# Patient Record
Sex: Male | Born: 1955 | Race: Black or African American | Hispanic: No | Smoking: Never smoker
Health system: Southern US, Community
[De-identification: ages and names within clinical notes are randomized; demographics above are authoritative.]

## PROBLEM LIST (undated history)

## (undated) DIAGNOSIS — E785 Hyperlipidemia, unspecified: Secondary | ICD-10-CM

## (undated) DIAGNOSIS — E11319 Type 2 diabetes mellitus with unspecified diabetic retinopathy without macular edema: Secondary | ICD-10-CM

## (undated) DIAGNOSIS — I219 Acute myocardial infarction, unspecified: Secondary | ICD-10-CM

## (undated) DIAGNOSIS — E1149 Type 2 diabetes mellitus with other diabetic neurological complication: Secondary | ICD-10-CM

## (undated) DIAGNOSIS — L309 Dermatitis, unspecified: Secondary | ICD-10-CM

## (undated) DIAGNOSIS — N289 Disorder of kidney and ureter, unspecified: Secondary | ICD-10-CM

## (undated) DIAGNOSIS — K668 Other specified disorders of peritoneum: Secondary | ICD-10-CM

## (undated) DIAGNOSIS — Z992 Dependence on renal dialysis: Secondary | ICD-10-CM

## (undated) DIAGNOSIS — E1140 Type 2 diabetes mellitus with diabetic neuropathy, unspecified: Secondary | ICD-10-CM

## (undated) DIAGNOSIS — I10 Essential (primary) hypertension: Secondary | ICD-10-CM

## (undated) DIAGNOSIS — D126 Benign neoplasm of colon, unspecified: Secondary | ICD-10-CM

## (undated) DIAGNOSIS — N186 End stage renal disease: Secondary | ICD-10-CM

## (undated) DIAGNOSIS — D649 Anemia, unspecified: Secondary | ICD-10-CM

## (undated) DIAGNOSIS — I5042 Chronic combined systolic (congestive) and diastolic (congestive) heart failure: Secondary | ICD-10-CM

## (undated) DIAGNOSIS — C801 Malignant (primary) neoplasm, unspecified: Secondary | ICD-10-CM

## (undated) DIAGNOSIS — J189 Pneumonia, unspecified organism: Secondary | ICD-10-CM

## (undated) DIAGNOSIS — H9191 Unspecified hearing loss, right ear: Secondary | ICD-10-CM

## (undated) DIAGNOSIS — G4730 Sleep apnea, unspecified: Secondary | ICD-10-CM

## (undated) DIAGNOSIS — I160 Hypertensive urgency: Secondary | ICD-10-CM

## (undated) DIAGNOSIS — I169 Hypertensive crisis, unspecified: Secondary | ICD-10-CM

## (undated) DIAGNOSIS — N049 Nephrotic syndrome with unspecified morphologic changes: Secondary | ICD-10-CM

## (undated) DIAGNOSIS — I639 Cerebral infarction, unspecified: Secondary | ICD-10-CM

## (undated) DIAGNOSIS — I509 Heart failure, unspecified: Secondary | ICD-10-CM

## (undated) HISTORY — DX: Benign neoplasm of colon, unspecified: D12.6

## (undated) HISTORY — DX: Hyperlipidemia, unspecified: E78.5

## (undated) HISTORY — DX: Cerebral infarction, unspecified: I63.9

## (undated) HISTORY — DX: Heart failure, unspecified: I50.9

## (undated) HISTORY — DX: Type 2 diabetes mellitus with other diabetic neurological complication: E11.49

## (undated) HISTORY — DX: Hypertensive crisis, unspecified: I16.9

## (undated) HISTORY — PX: COLONOSCOPY: SHX174

## (undated) HISTORY — DX: Essential (primary) hypertension: I10

## (undated) HISTORY — DX: Dermatitis, unspecified: L30.9

## (undated) HISTORY — DX: Hypertensive urgency: I16.0

## (undated) HISTORY — PX: CARDIAC CATHETERIZATION: SHX172

## (undated) HISTORY — PX: POLYPECTOMY: SHX149

## (undated) HISTORY — PX: FOOT SURGERY: SHX648

## (undated) HISTORY — DX: Other specified disorders of peritoneum: K66.8

## (undated) HISTORY — DX: Nephrotic syndrome with unspecified morphologic changes: N04.9

## (undated) HISTORY — DX: Type 2 diabetes mellitus with unspecified diabetic retinopathy without macular edema: E11.319

## (undated) HISTORY — DX: Unspecified hearing loss, right ear: H91.91

## (undated) HISTORY — DX: Chronic combined systolic (congestive) and diastolic (congestive) heart failure: I50.42

---

## 1997-11-18 ENCOUNTER — Encounter: Admission: RE | Admit: 1997-11-18 | Discharge: 1997-11-18 | Payer: Self-pay | Admitting: Internal Medicine

## 1997-12-03 ENCOUNTER — Ambulatory Visit (HOSPITAL_COMMUNITY): Admission: RE | Admit: 1997-12-03 | Discharge: 1997-12-03 | Payer: Self-pay | Admitting: Internal Medicine

## 1997-12-03 ENCOUNTER — Encounter: Admission: RE | Admit: 1997-12-03 | Discharge: 1997-12-03 | Payer: Self-pay | Admitting: Internal Medicine

## 1999-12-14 ENCOUNTER — Emergency Department (HOSPITAL_COMMUNITY): Admission: EM | Admit: 1999-12-14 | Discharge: 1999-12-14 | Payer: Self-pay | Admitting: Emergency Medicine

## 1999-12-16 ENCOUNTER — Encounter: Admission: RE | Admit: 1999-12-16 | Discharge: 1999-12-16 | Payer: Self-pay | Admitting: Hematology and Oncology

## 1999-12-30 ENCOUNTER — Encounter: Admission: RE | Admit: 1999-12-30 | Discharge: 2000-03-29 | Payer: Self-pay | Admitting: *Deleted

## 1999-12-30 ENCOUNTER — Encounter: Admission: RE | Admit: 1999-12-30 | Discharge: 1999-12-30 | Payer: Self-pay | Admitting: Hematology and Oncology

## 2000-06-15 ENCOUNTER — Encounter: Admission: RE | Admit: 2000-06-15 | Discharge: 2000-06-15 | Payer: Self-pay | Admitting: Hematology and Oncology

## 2000-09-03 ENCOUNTER — Inpatient Hospital Stay (HOSPITAL_COMMUNITY): Admission: EM | Admit: 2000-09-03 | Discharge: 2000-09-07 | Payer: Self-pay | Admitting: *Deleted

## 2000-09-03 ENCOUNTER — Encounter: Payer: Self-pay | Admitting: *Deleted

## 2000-09-05 ENCOUNTER — Encounter: Payer: Self-pay | Admitting: Cardiology

## 2000-09-11 ENCOUNTER — Encounter: Admission: RE | Admit: 2000-09-11 | Discharge: 2000-09-11 | Payer: Self-pay | Admitting: Internal Medicine

## 2001-03-20 ENCOUNTER — Encounter: Admission: RE | Admit: 2001-03-20 | Discharge: 2001-03-20 | Payer: Self-pay | Admitting: *Deleted

## 2001-09-24 ENCOUNTER — Encounter: Admission: RE | Admit: 2001-09-24 | Discharge: 2001-09-24 | Payer: Self-pay | Admitting: Internal Medicine

## 2002-03-17 ENCOUNTER — Emergency Department (HOSPITAL_COMMUNITY): Admission: EM | Admit: 2002-03-17 | Discharge: 2002-03-17 | Payer: Self-pay | Admitting: Emergency Medicine

## 2002-03-17 ENCOUNTER — Encounter: Payer: Self-pay | Admitting: Emergency Medicine

## 2002-08-04 ENCOUNTER — Inpatient Hospital Stay (HOSPITAL_COMMUNITY): Admission: EM | Admit: 2002-08-04 | Discharge: 2002-08-05 | Payer: Self-pay | Admitting: Emergency Medicine

## 2002-08-04 ENCOUNTER — Encounter: Payer: Self-pay | Admitting: Emergency Medicine

## 2002-08-05 ENCOUNTER — Encounter: Payer: Self-pay | Admitting: Internal Medicine

## 2002-08-13 ENCOUNTER — Encounter: Payer: Self-pay | Admitting: Emergency Medicine

## 2002-08-13 ENCOUNTER — Inpatient Hospital Stay (HOSPITAL_COMMUNITY): Admission: EM | Admit: 2002-08-13 | Discharge: 2002-08-16 | Payer: Self-pay | Admitting: Emergency Medicine

## 2002-08-14 ENCOUNTER — Encounter: Payer: Self-pay | Admitting: Cardiovascular Disease

## 2002-08-14 ENCOUNTER — Encounter: Payer: Self-pay | Admitting: Internal Medicine

## 2002-08-30 ENCOUNTER — Encounter: Admission: RE | Admit: 2002-08-30 | Discharge: 2002-08-30 | Payer: Self-pay | Admitting: Internal Medicine

## 2002-09-24 ENCOUNTER — Ambulatory Visit (HOSPITAL_COMMUNITY): Admission: RE | Admit: 2002-09-24 | Discharge: 2002-09-24 | Payer: Self-pay | Admitting: Internal Medicine

## 2002-09-24 ENCOUNTER — Encounter: Admission: RE | Admit: 2002-09-24 | Discharge: 2002-09-24 | Payer: Self-pay | Admitting: Internal Medicine

## 2002-10-01 ENCOUNTER — Encounter: Admission: RE | Admit: 2002-10-01 | Discharge: 2002-10-01 | Payer: Self-pay | Admitting: Internal Medicine

## 2002-10-08 ENCOUNTER — Encounter: Admission: RE | Admit: 2002-10-08 | Discharge: 2002-10-08 | Payer: Self-pay | Admitting: Internal Medicine

## 2002-11-06 ENCOUNTER — Encounter: Admission: RE | Admit: 2002-11-06 | Discharge: 2002-11-06 | Payer: Self-pay | Admitting: Internal Medicine

## 2002-11-14 ENCOUNTER — Encounter: Admission: RE | Admit: 2002-11-14 | Discharge: 2002-11-14 | Payer: Self-pay | Admitting: Internal Medicine

## 2003-01-02 ENCOUNTER — Encounter: Admission: RE | Admit: 2003-01-02 | Discharge: 2003-01-02 | Payer: Self-pay | Admitting: Internal Medicine

## 2003-01-09 ENCOUNTER — Encounter: Admission: RE | Admit: 2003-01-09 | Discharge: 2003-01-09 | Payer: Self-pay | Admitting: Internal Medicine

## 2003-01-24 ENCOUNTER — Encounter: Admission: RE | Admit: 2003-01-24 | Discharge: 2003-01-24 | Payer: Self-pay | Admitting: Internal Medicine

## 2003-02-28 ENCOUNTER — Encounter: Admission: RE | Admit: 2003-02-28 | Discharge: 2003-02-28 | Payer: Self-pay | Admitting: Internal Medicine

## 2003-02-28 ENCOUNTER — Ambulatory Visit (HOSPITAL_COMMUNITY): Admission: RE | Admit: 2003-02-28 | Discharge: 2003-02-28 | Payer: Self-pay | Admitting: Internal Medicine

## 2003-07-24 ENCOUNTER — Encounter: Admission: RE | Admit: 2003-07-24 | Discharge: 2003-07-24 | Payer: Self-pay | Admitting: Internal Medicine

## 2003-08-19 ENCOUNTER — Encounter: Admission: RE | Admit: 2003-08-19 | Discharge: 2003-08-19 | Payer: Self-pay | Admitting: Internal Medicine

## 2003-12-03 ENCOUNTER — Encounter: Admission: RE | Admit: 2003-12-03 | Discharge: 2003-12-03 | Payer: Self-pay | Admitting: Internal Medicine

## 2004-01-04 ENCOUNTER — Emergency Department (HOSPITAL_COMMUNITY): Admission: EM | Admit: 2004-01-04 | Discharge: 2004-01-04 | Payer: Self-pay | Admitting: Emergency Medicine

## 2004-02-09 ENCOUNTER — Encounter: Admission: RE | Admit: 2004-02-09 | Discharge: 2004-02-09 | Payer: Self-pay | Admitting: Internal Medicine

## 2004-04-16 ENCOUNTER — Ambulatory Visit: Payer: Self-pay | Admitting: Internal Medicine

## 2004-08-16 ENCOUNTER — Emergency Department (HOSPITAL_COMMUNITY): Admission: EM | Admit: 2004-08-16 | Discharge: 2004-08-16 | Payer: Self-pay | Admitting: Emergency Medicine

## 2004-08-23 ENCOUNTER — Ambulatory Visit: Payer: Self-pay | Admitting: Internal Medicine

## 2004-08-30 ENCOUNTER — Ambulatory Visit: Payer: Self-pay | Admitting: Internal Medicine

## 2004-08-30 ENCOUNTER — Ambulatory Visit: Payer: Self-pay

## 2004-09-06 ENCOUNTER — Ambulatory Visit: Payer: Self-pay | Admitting: Internal Medicine

## 2004-09-27 ENCOUNTER — Emergency Department: Payer: Self-pay | Admitting: Emergency Medicine

## 2004-10-13 ENCOUNTER — Ambulatory Visit: Payer: Self-pay | Admitting: Internal Medicine

## 2004-12-01 ENCOUNTER — Ambulatory Visit: Payer: Self-pay | Admitting: Internal Medicine

## 2005-02-02 ENCOUNTER — Ambulatory Visit: Payer: Self-pay | Admitting: Internal Medicine

## 2005-02-12 ENCOUNTER — Inpatient Hospital Stay (HOSPITAL_COMMUNITY): Admission: EM | Admit: 2005-02-12 | Discharge: 2005-02-14 | Payer: Self-pay | Admitting: Emergency Medicine

## 2005-02-23 ENCOUNTER — Ambulatory Visit (HOSPITAL_COMMUNITY): Admission: RE | Admit: 2005-02-23 | Discharge: 2005-02-23 | Payer: Self-pay | Admitting: Internal Medicine

## 2005-02-23 ENCOUNTER — Ambulatory Visit: Payer: Self-pay | Admitting: Internal Medicine

## 2005-02-24 ENCOUNTER — Ambulatory Visit: Payer: Self-pay | Admitting: Internal Medicine

## 2005-03-01 ENCOUNTER — Ambulatory Visit: Payer: Self-pay | Admitting: Internal Medicine

## 2005-04-01 ENCOUNTER — Ambulatory Visit: Payer: Self-pay | Admitting: Internal Medicine

## 2005-04-05 ENCOUNTER — Ambulatory Visit: Payer: Self-pay | Admitting: Internal Medicine

## 2005-05-14 ENCOUNTER — Emergency Department (HOSPITAL_COMMUNITY): Admission: EM | Admit: 2005-05-14 | Discharge: 2005-05-14 | Payer: Self-pay | Admitting: Emergency Medicine

## 2005-05-17 ENCOUNTER — Emergency Department (HOSPITAL_COMMUNITY): Admission: EM | Admit: 2005-05-17 | Discharge: 2005-05-17 | Payer: Self-pay | Admitting: Emergency Medicine

## 2005-05-19 ENCOUNTER — Ambulatory Visit: Payer: Self-pay | Admitting: Internal Medicine

## 2005-05-22 ENCOUNTER — Emergency Department (HOSPITAL_COMMUNITY): Admission: EM | Admit: 2005-05-22 | Discharge: 2005-05-23 | Payer: Self-pay | Admitting: Emergency Medicine

## 2005-06-21 ENCOUNTER — Ambulatory Visit: Payer: Self-pay | Admitting: Internal Medicine

## 2005-06-22 ENCOUNTER — Emergency Department (HOSPITAL_COMMUNITY): Admission: EM | Admit: 2005-06-22 | Discharge: 2005-06-22 | Payer: Self-pay | Admitting: Emergency Medicine

## 2005-09-22 ENCOUNTER — Ambulatory Visit: Payer: Self-pay | Admitting: Internal Medicine

## 2006-06-01 DIAGNOSIS — E785 Hyperlipidemia, unspecified: Secondary | ICD-10-CM | POA: Insufficient documentation

## 2006-06-01 DIAGNOSIS — I10 Essential (primary) hypertension: Secondary | ICD-10-CM | POA: Insufficient documentation

## 2006-06-14 ENCOUNTER — Emergency Department (HOSPITAL_COMMUNITY): Admission: EM | Admit: 2006-06-14 | Discharge: 2006-06-14 | Payer: Self-pay | Admitting: Emergency Medicine

## 2006-06-28 ENCOUNTER — Ambulatory Visit: Payer: Self-pay | Admitting: Internal Medicine

## 2006-06-29 ENCOUNTER — Ambulatory Visit: Payer: Self-pay | Admitting: Internal Medicine

## 2006-07-21 ENCOUNTER — Ambulatory Visit: Payer: Self-pay | Admitting: Internal Medicine

## 2006-07-21 LAB — CONVERTED CEMR LAB
ALT: 22 units/L (ref 0–53)
AST: 16 units/L (ref 0–37)
Albumin: 4.8 g/dL (ref 3.5–5.2)
Alkaline Phosphatase: 44 units/L (ref 39–117)
BUN: 16 mg/dL (ref 6–23)
CO2: 31 meq/L (ref 19–32)
Calcium: 9.5 mg/dL (ref 8.4–10.5)
Chloride: 100 meq/L (ref 96–112)
Cholesterol: 117 mg/dL (ref 0–200)
Creatinine, Ser: 0.8 mg/dL (ref 0.40–1.50)
Glucose, Bld: 171 mg/dL — ABNORMAL HIGH (ref 70–99)
HDL: 32 mg/dL — ABNORMAL LOW (ref >39)
LDL Cholesterol: 61 mg/dL (ref 0–99)
Potassium: 4.2 meq/L (ref 3.5–5.3)
Sodium: 140 meq/L (ref 135–145)
Total Bilirubin: 0.5 mg/dL (ref 0.3–1.2)
Total CHOL/HDL Ratio: 3.7
Total Protein: 7.3 g/dL (ref 6.0–8.3)
Triglycerides: 119 mg/dL (ref <150)
VLDL: 24 mg/dL (ref 0–40)

## 2006-09-01 ENCOUNTER — Ambulatory Visit: Payer: Self-pay | Admitting: Cardiovascular Disease

## 2006-09-01 LAB — CONVERTED CEMR LAB
BUN: 16 mg/dL (ref 6–23)
CO2: 32 meq/L (ref 19–32)
Calcium: 9.8 mg/dL (ref 8.4–10.5)
Chloride: 102 meq/L (ref 96–112)
Creatinine, Ser: 1.1 mg/dL (ref 0.4–1.5)
GFR calc Af Amer: 92 mL/min
GFR calc non Af Amer: 76 mL/min
Glucose, Bld: 216 mg/dL — ABNORMAL HIGH (ref 70–99)
Potassium: 3.7 meq/L (ref 3.5–5.1)
Sodium: 141 meq/L (ref 135–145)

## 2006-09-04 ENCOUNTER — Ambulatory Visit (HOSPITAL_COMMUNITY): Admission: RE | Admit: 2006-09-04 | Discharge: 2006-09-04 | Payer: Self-pay | Admitting: Cardiovascular Disease

## 2006-09-04 ENCOUNTER — Ambulatory Visit: Payer: Self-pay | Admitting: Cardiovascular Disease

## 2006-09-05 ENCOUNTER — Ambulatory Visit: Payer: Self-pay

## 2006-09-05 ENCOUNTER — Encounter: Payer: Self-pay | Admitting: Internal Medicine

## 2006-09-15 ENCOUNTER — Encounter: Payer: Self-pay | Admitting: Internal Medicine

## 2006-09-15 ENCOUNTER — Ambulatory Visit: Payer: Self-pay | Admitting: Cardiovascular Disease

## 2006-09-15 DIAGNOSIS — E1122 Type 2 diabetes mellitus with diabetic chronic kidney disease: Secondary | ICD-10-CM | POA: Insufficient documentation

## 2006-09-15 LAB — CONVERTED CEMR LAB
BUN: 12 mg/dL (ref 6–23)
Basophils Absolute: 0 10*3/uL (ref 0.0–0.1)
Basophils Relative: 0.8 % (ref 0.0–1.0)
CO2: 30 meq/L (ref 19–32)
Calcium: 8.5 mg/dL (ref 8.4–10.5)
Chloride: 100 meq/L (ref 96–112)
Creatinine, Ser: 1 mg/dL (ref 0.4–1.5)
Eosinophils Absolute: 0 10*3/uL (ref 0.0–0.6)
Eosinophils Relative: 0.1 % (ref 0.0–5.0)
GFR calc Af Amer: 102 mL/min
GFR calc non Af Amer: 84 mL/min
Glucose, Bld: 297 mg/dL — ABNORMAL HIGH (ref 70–99)
HCT: 37.6 % — ABNORMAL LOW (ref 39.0–52.0)
Hemoglobin: 12.8 g/dL — ABNORMAL LOW (ref 13.0–17.0)
INR: 1.1 (ref 0.9–2.0)
Lymphocytes Relative: 16 % (ref 12.0–46.0)
MCHC: 34 g/dL (ref 30.0–36.0)
MCV: 84.6 fL (ref 78.0–100.0)
Monocytes Absolute: 0.9 10*3/uL — ABNORMAL HIGH (ref 0.2–0.7)
Monocytes Relative: 16.5 % — ABNORMAL HIGH (ref 3.0–11.0)
Neutro Abs: 3.7 10*3/uL (ref 1.4–7.7)
Neutrophils Relative %: 66.6 % (ref 43.0–77.0)
Platelets: 226 10*3/uL (ref 150–400)
Potassium: 3.4 meq/L — ABNORMAL LOW (ref 3.5–5.1)
Prothrombin Time: 13.2 s (ref 10.0–14.0)
RBC: 4.44 M/uL (ref 4.22–5.81)
RDW: 13.1 % (ref 11.5–14.6)
Sodium: 139 meq/L (ref 135–145)
WBC: 5.5 10*3/uL (ref 4.5–10.5)
aPTT: 32.3 s (ref 26.5–36.5)

## 2006-09-19 ENCOUNTER — Ambulatory Visit: Payer: Self-pay | Admitting: Cardiovascular Disease

## 2006-09-19 ENCOUNTER — Inpatient Hospital Stay (HOSPITAL_BASED_OUTPATIENT_CLINIC_OR_DEPARTMENT_OTHER): Admission: RE | Admit: 2006-09-19 | Discharge: 2006-09-19 | Payer: Self-pay | Admitting: Cardiovascular Disease

## 2006-10-18 ENCOUNTER — Telehealth (INDEPENDENT_AMBULATORY_CARE_PROVIDER_SITE_OTHER): Payer: Self-pay | Admitting: *Deleted

## 2006-10-26 ENCOUNTER — Ambulatory Visit: Payer: Self-pay | Admitting: Cardiovascular Disease

## 2006-12-27 ENCOUNTER — Telehealth (INDEPENDENT_AMBULATORY_CARE_PROVIDER_SITE_OTHER): Payer: Self-pay | Admitting: *Deleted

## 2007-01-24 ENCOUNTER — Encounter: Payer: Self-pay | Admitting: Internal Medicine

## 2007-02-08 ENCOUNTER — Ambulatory Visit: Payer: Self-pay | Admitting: Internal Medicine

## 2007-02-08 DIAGNOSIS — Z8679 Personal history of other diseases of the circulatory system: Secondary | ICD-10-CM | POA: Insufficient documentation

## 2007-02-08 LAB — CONVERTED CEMR LAB
ALT: 20 units/L (ref 0–53)
AST: 16 units/L (ref 0–37)
Albumin: 4.9 g/dL (ref 3.5–5.2)
Alkaline Phosphatase: 51 units/L (ref 39–117)
BUN: 17 mg/dL (ref 6–23)
Basophils Absolute: 0 10*3/uL (ref 0.0–0.1)
Basophils Relative: 1 % (ref 0–1)
Blood Glucose, Fingerstick: 203
CO2: 25 meq/L (ref 19–32)
Calcium: 8.8 mg/dL (ref 8.4–10.5)
Chloride: 100 meq/L (ref 96–112)
Creatinine, Ser: 0.77 mg/dL (ref 0.40–1.50)
Creatinine, Urine: 182.2 mg/dL
Digitoxin Lvl: 0.6 ng/mL — ABNORMAL LOW (ref 0.8–2.0)
Eosinophils Absolute: 0.1 10*3/uL (ref 0.0–0.7)
Eosinophils Relative: 2 % (ref 0–5)
Glucose, Bld: 181 mg/dL — ABNORMAL HIGH (ref 70–99)
HCT: 43 % (ref 39.0–52.0)
Hemoglobin: 13.7 g/dL (ref 13.0–17.0)
Hgb A1c MFr Bld: 6.2 %
Lymphocytes Relative: 40 % (ref 12–46)
Lymphs Abs: 1.8 10*3/uL (ref 0.7–3.3)
MCHC: 31.9 g/dL (ref 30.0–36.0)
MCV: 87 fL (ref 78.0–100.0)
Microalb Creat Ratio: 100.4 mg/g — ABNORMAL HIGH (ref 0.0–30.0)
Microalb, Ur: 18.3 mg/dL — ABNORMAL HIGH (ref 0.00–1.89)
Monocytes Absolute: 0.4 10*3/uL (ref 0.2–0.7)
Monocytes Relative: 8 % (ref 3–11)
Neutro Abs: 2.2 10*3/uL (ref 1.7–7.7)
Neutrophils Relative %: 49 % (ref 43–77)
Platelets: 316 10*3/uL (ref 150–400)
Potassium: 3.8 meq/L (ref 3.5–5.3)
RBC: 4.94 M/uL (ref 4.22–5.81)
RDW: 14.2 % — ABNORMAL HIGH (ref 11.5–14.0)
Sodium: 141 meq/L (ref 135–145)
Total Bilirubin: 0.6 mg/dL (ref 0.3–1.2)
Total Protein: 7.5 g/dL (ref 6.0–8.3)
WBC: 4.4 10*3/uL (ref 4.0–10.5)

## 2007-03-08 ENCOUNTER — Ambulatory Visit: Payer: Self-pay | Admitting: Cardiovascular Disease

## 2007-06-27 ENCOUNTER — Telehealth (INDEPENDENT_AMBULATORY_CARE_PROVIDER_SITE_OTHER): Payer: Self-pay | Admitting: Pharmacy Technician

## 2007-07-30 ENCOUNTER — Ambulatory Visit: Payer: Self-pay | Admitting: Internal Medicine

## 2007-07-30 DIAGNOSIS — H9190 Unspecified hearing loss, unspecified ear: Secondary | ICD-10-CM | POA: Insufficient documentation

## 2007-07-30 LAB — CONVERTED CEMR LAB
Blood Glucose, Fingerstick: 117
Hgb A1c MFr Bld: 6.8 %

## 2007-08-03 DIAGNOSIS — E1140 Type 2 diabetes mellitus with diabetic neuropathy, unspecified: Secondary | ICD-10-CM | POA: Insufficient documentation

## 2007-08-03 DIAGNOSIS — E1149 Type 2 diabetes mellitus with other diabetic neurological complication: Secondary | ICD-10-CM

## 2007-08-03 HISTORY — DX: Type 2 diabetes mellitus with other diabetic neurological complication: E11.49

## 2007-08-06 ENCOUNTER — Ambulatory Visit: Payer: Self-pay | Admitting: Internal Medicine

## 2007-08-06 LAB — CONVERTED CEMR LAB
ALT: 17 units/L (ref 0–53)
AST: 13 units/L (ref 0–37)
Albumin: 4.7 g/dL (ref 3.5–5.2)
Alkaline Phosphatase: 47 units/L (ref 39–117)
BUN: 14 mg/dL (ref 6–23)
CO2: 28 meq/L (ref 19–32)
Calcium: 8.9 mg/dL (ref 8.4–10.5)
Chloride: 102 meq/L (ref 96–112)
Cholesterol: 119 mg/dL (ref 0–200)
Creatinine, Ser: 0.77 mg/dL (ref 0.40–1.50)
Digitoxin Lvl: 0.6 ng/mL — ABNORMAL LOW (ref 0.8–2.0)
Glucose, Bld: 154 mg/dL — ABNORMAL HIGH (ref 70–99)
HDL: 41 mg/dL (ref >39)
LDL Cholesterol: 58 mg/dL (ref 0–99)
Potassium: 4.1 meq/L (ref 3.5–5.3)
Sodium: 141 meq/L (ref 135–145)
Total Bilirubin: 0.5 mg/dL (ref 0.3–1.2)
Total CHOL/HDL Ratio: 2.9
Total Protein: 7.2 g/dL (ref 6.0–8.3)
Triglycerides: 100 mg/dL (ref <150)
VLDL: 20 mg/dL (ref 0–40)

## 2007-08-07 ENCOUNTER — Encounter: Payer: Self-pay | Admitting: Internal Medicine

## 2007-08-07 ENCOUNTER — Ambulatory Visit (HOSPITAL_COMMUNITY): Admission: RE | Admit: 2007-08-07 | Discharge: 2007-08-07 | Payer: Self-pay | Admitting: Internal Medicine

## 2007-08-08 ENCOUNTER — Encounter: Payer: Self-pay | Admitting: Internal Medicine

## 2007-09-20 ENCOUNTER — Encounter: Payer: Self-pay | Admitting: Internal Medicine

## 2007-09-21 ENCOUNTER — Encounter: Payer: Self-pay | Admitting: Internal Medicine

## 2007-10-10 ENCOUNTER — Telehealth (INDEPENDENT_AMBULATORY_CARE_PROVIDER_SITE_OTHER): Payer: Self-pay | Admitting: *Deleted

## 2007-10-10 ENCOUNTER — Encounter: Admission: RE | Admit: 2007-10-10 | Discharge: 2007-10-10 | Payer: Self-pay | Admitting: Otolaryngology

## 2007-11-29 ENCOUNTER — Telehealth (INDEPENDENT_AMBULATORY_CARE_PROVIDER_SITE_OTHER): Payer: Self-pay | Admitting: Pharmacy Technician

## 2007-12-04 ENCOUNTER — Telehealth: Payer: Self-pay | Admitting: Licensed Clinical Social Worker

## 2007-12-04 ENCOUNTER — Encounter: Payer: Self-pay | Admitting: Licensed Clinical Social Worker

## 2007-12-10 ENCOUNTER — Encounter: Payer: Self-pay | Admitting: Licensed Clinical Social Worker

## 2007-12-12 ENCOUNTER — Encounter: Payer: Self-pay | Admitting: Licensed Clinical Social Worker

## 2007-12-13 ENCOUNTER — Telehealth: Payer: Self-pay | Admitting: Licensed Clinical Social Worker

## 2007-12-19 ENCOUNTER — Ambulatory Visit: Payer: Self-pay | Admitting: Internal Medicine

## 2007-12-19 LAB — CONVERTED CEMR LAB
ALT: 17 units/L (ref 0–53)
AST: 15 units/L (ref 0–37)
Albumin: 4.5 g/dL (ref 3.5–5.2)
Alkaline Phosphatase: 56 units/L (ref 39–117)
BUN: 20 mg/dL (ref 6–23)
Blood Glucose, Fingerstick: 288
CO2: 21 meq/L (ref 19–32)
Calcium: 8.8 mg/dL (ref 8.4–10.5)
Chloride: 98 meq/L (ref 96–112)
Creatinine, Ser: 0.85 mg/dL (ref 0.40–1.50)
Creatinine, Urine: 77.7 mg/dL
Digitoxin Lvl: 0.7 ng/mL — ABNORMAL LOW (ref 0.8–2.0)
Glucose, Bld: 275 mg/dL — ABNORMAL HIGH (ref 70–99)
Microalb Creat Ratio: 208.5 mg/g — ABNORMAL HIGH (ref 0.0–30.0)
Microalb, Ur: 16.2 mg/dL — ABNORMAL HIGH (ref 0.00–1.89)
Potassium: 3.8 meq/L (ref 3.5–5.3)
Sodium: 138 meq/L (ref 135–145)
Total Bilirubin: 0.5 mg/dL (ref 0.3–1.2)
Total Protein: 6.8 g/dL (ref 6.0–8.3)

## 2008-01-29 ENCOUNTER — Ambulatory Visit: Payer: Self-pay | Admitting: Gastroenterology

## 2008-01-29 ENCOUNTER — Emergency Department (HOSPITAL_COMMUNITY): Admission: EM | Admit: 2008-01-29 | Discharge: 2008-01-29 | Payer: Self-pay | Admitting: Emergency Medicine

## 2008-01-29 ENCOUNTER — Telehealth: Payer: Self-pay | Admitting: *Deleted

## 2008-01-29 ENCOUNTER — Encounter: Payer: Self-pay | Admitting: Licensed Clinical Social Worker

## 2008-01-29 ENCOUNTER — Encounter (INDEPENDENT_AMBULATORY_CARE_PROVIDER_SITE_OTHER): Payer: Self-pay | Admitting: *Deleted

## 2008-01-29 ENCOUNTER — Encounter: Payer: Self-pay | Admitting: Internal Medicine

## 2008-02-06 ENCOUNTER — Encounter: Payer: Self-pay | Admitting: Gastroenterology

## 2008-02-06 ENCOUNTER — Ambulatory Visit: Payer: Self-pay | Admitting: Gastroenterology

## 2008-02-07 ENCOUNTER — Encounter: Payer: Self-pay | Admitting: Gastroenterology

## 2008-04-22 ENCOUNTER — Ambulatory Visit: Payer: Self-pay | Admitting: Cardiovascular Disease

## 2008-04-30 ENCOUNTER — Ambulatory Visit: Payer: Self-pay | Admitting: Internal Medicine

## 2008-04-30 LAB — CONVERTED CEMR LAB
ALT: 19 units/L (ref 0–53)
AST: 17 units/L (ref 0–37)
Albumin: 4.7 g/dL (ref 3.5–5.2)
Alkaline Phosphatase: 44 units/L (ref 39–117)
BUN: 19 mg/dL (ref 6–23)
Blood Glucose, Fingerstick: 166
CO2: 26 meq/L (ref 19–32)
Calcium: 8.9 mg/dL (ref 8.4–10.5)
Chloride: 101 meq/L (ref 96–112)
Creatinine, Ser: 0.93 mg/dL (ref 0.40–1.50)
Glucose, Bld: 143 mg/dL — ABNORMAL HIGH (ref 70–99)
Hgb A1c MFr Bld: 7.4 %
Potassium: 3.9 meq/L (ref 3.5–5.3)
Sodium: 139 meq/L (ref 135–145)
Total Bilirubin: 0.5 mg/dL (ref 0.3–1.2)
Total Protein: 7.1 g/dL (ref 6.0–8.3)

## 2008-07-02 ENCOUNTER — Ambulatory Visit: Payer: Self-pay | Admitting: Internal Medicine

## 2008-07-02 LAB — CONVERTED CEMR LAB
Blood Glucose, Fingerstick: 260
Hgb A1c MFr Bld: 7.4 %

## 2008-07-18 ENCOUNTER — Ambulatory Visit: Payer: Self-pay | Admitting: Internal Medicine

## 2008-07-18 LAB — CONVERTED CEMR LAB
ALT: 20 units/L (ref 0–53)
AST: 16 units/L (ref 0–37)
Albumin: 4.5 g/dL (ref 3.5–5.2)
Alkaline Phosphatase: 46 units/L (ref 39–117)
BUN: 18 mg/dL (ref 6–23)
CO2: 27 meq/L (ref 19–32)
Calcium: 9 mg/dL (ref 8.4–10.5)
Chloride: 100 meq/L (ref 96–112)
Cholesterol: 177 mg/dL (ref 0–200)
Creatinine, Ser: 0.95 mg/dL (ref 0.40–1.50)
Glucose, Bld: 262 mg/dL — ABNORMAL HIGH (ref 70–99)
HDL: 42 mg/dL (ref >39)
LDL Cholesterol: 86 mg/dL (ref 0–99)
Potassium: 3.7 meq/L (ref 3.5–5.3)
Sodium: 137 meq/L (ref 135–145)
Total Bilirubin: 0.5 mg/dL (ref 0.3–1.2)
Total CHOL/HDL Ratio: 4.2
Total Protein: 7 g/dL (ref 6.0–8.3)
Triglycerides: 243 mg/dL — ABNORMAL HIGH (ref <150)
VLDL: 49 mg/dL — ABNORMAL HIGH (ref 0–40)

## 2008-07-29 ENCOUNTER — Encounter: Payer: Self-pay | Admitting: Internal Medicine

## 2008-09-16 ENCOUNTER — Encounter: Payer: Self-pay | Admitting: Internal Medicine

## 2008-10-03 ENCOUNTER — Encounter: Payer: Self-pay | Admitting: Internal Medicine

## 2008-10-07 ENCOUNTER — Telehealth (INDEPENDENT_AMBULATORY_CARE_PROVIDER_SITE_OTHER): Payer: Self-pay | Admitting: *Deleted

## 2008-10-07 ENCOUNTER — Encounter: Payer: Self-pay | Admitting: Internal Medicine

## 2008-11-13 ENCOUNTER — Emergency Department (HOSPITAL_COMMUNITY): Admission: EM | Admit: 2008-11-13 | Discharge: 2008-11-13 | Payer: Self-pay | Admitting: Emergency Medicine

## 2008-12-17 ENCOUNTER — Telehealth: Payer: Self-pay | Admitting: *Deleted

## 2009-03-18 ENCOUNTER — Telehealth (INDEPENDENT_AMBULATORY_CARE_PROVIDER_SITE_OTHER): Payer: Self-pay | Admitting: *Deleted

## 2009-03-24 ENCOUNTER — Telehealth: Payer: Self-pay | Admitting: Cardiovascular Disease

## 2009-03-26 ENCOUNTER — Ambulatory Visit: Payer: Self-pay | Admitting: Internal Medicine

## 2009-03-26 LAB — CONVERTED CEMR LAB
ALT: 21 units/L (ref 0–53)
AST: 17 units/L (ref 0–37)
Albumin: 4.7 g/dL (ref 3.5–5.2)
Alkaline Phosphatase: 41 units/L (ref 39–117)
BUN: 17 mg/dL (ref 6–23)
Basophils Absolute: 0 10*3/uL (ref 0.0–0.1)
Basophils Relative: 1 % (ref 0–1)
Blood Glucose, Fingerstick: 169
CO2: 27 meq/L (ref 19–32)
Calcium: 9.4 mg/dL (ref 8.4–10.5)
Chloride: 102 meq/L (ref 96–112)
Cholesterol: 153 mg/dL (ref 0–200)
Creatinine, Ser: 0.86 mg/dL (ref 0.40–1.50)
Creatinine, Urine: 114.4 mg/dL
Eosinophils Absolute: 0.2 10*3/uL (ref 0.0–0.7)
Eosinophils Relative: 5 % (ref 0–5)
Glucose, Bld: 115 mg/dL — ABNORMAL HIGH (ref 70–99)
HCT: 43.2 % (ref 39.0–52.0)
HDL: 41 mg/dL (ref >39)
Hemoglobin: 13.7 g/dL (ref 13.0–17.0)
Hgb A1c MFr Bld: 7.4 %
LDL Cholesterol: 70 mg/dL (ref 0–99)
Lymphocytes Relative: 42 % (ref 12–46)
Lymphs Abs: 2.1 10*3/uL (ref 0.7–4.0)
MCHC: 31.7 g/dL (ref 30.0–36.0)
MCV: 86.4 fL (ref >=78.0)
Microalb Creat Ratio: 244.1 mg/g — ABNORMAL HIGH (ref 0.0–30.0)
Microalb, Ur: 27.93 mg/dL — ABNORMAL HIGH (ref 0.00–1.89)
Monocytes Absolute: 0.5 10*3/uL (ref 0.1–1.0)
Monocytes Relative: 9 % (ref 3–12)
Neutro Abs: 2.2 10*3/uL (ref 1.7–7.7)
Neutrophils Relative %: 44 % (ref 43–77)
Platelets: 291 10*3/uL (ref 150–400)
Potassium: 4.1 meq/L (ref 3.5–5.3)
RBC: 5 M/uL (ref 4.22–5.81)
RDW: 13.6 % (ref 11.5–15.5)
Sodium: 141 meq/L (ref 135–145)
Total Bilirubin: 0.4 mg/dL (ref 0.3–1.2)
Total CHOL/HDL Ratio: 3.7
Total Protein: 7.1 g/dL (ref 6.0–8.3)
Triglycerides: 209 mg/dL — ABNORMAL HIGH (ref <150)
VLDL: 42 mg/dL — ABNORMAL HIGH (ref 0–40)
WBC: 5 10*3/uL (ref 4.0–10.5)

## 2009-04-01 ENCOUNTER — Encounter: Payer: Self-pay | Admitting: Cardiovascular Disease

## 2009-04-01 ENCOUNTER — Encounter: Payer: Self-pay | Admitting: Physician Assistant

## 2009-04-01 ENCOUNTER — Ambulatory Visit: Payer: Self-pay | Admitting: Cardiology

## 2009-04-01 ENCOUNTER — Ambulatory Visit: Payer: Self-pay

## 2009-04-01 DIAGNOSIS — R0989 Other specified symptoms and signs involving the circulatory and respiratory systems: Secondary | ICD-10-CM | POA: Insufficient documentation

## 2009-04-21 ENCOUNTER — Telehealth: Payer: Self-pay | Admitting: Internal Medicine

## 2009-04-24 ENCOUNTER — Ambulatory Visit: Payer: Self-pay

## 2009-04-24 ENCOUNTER — Ambulatory Visit: Payer: Self-pay | Admitting: Cardiovascular Disease

## 2009-04-29 ENCOUNTER — Encounter: Payer: Self-pay | Admitting: Cardiovascular Disease

## 2009-09-09 ENCOUNTER — Encounter (INDEPENDENT_AMBULATORY_CARE_PROVIDER_SITE_OTHER): Payer: Self-pay | Admitting: *Deleted

## 2009-11-03 ENCOUNTER — Ambulatory Visit: Payer: Self-pay | Admitting: Cardiovascular Disease

## 2009-11-11 ENCOUNTER — Ambulatory Visit: Payer: Self-pay | Admitting: Internal Medicine

## 2009-11-11 LAB — CONVERTED CEMR LAB
ALT: 19 units/L (ref 0–53)
AST: 18 units/L (ref 0–37)
Albumin: 4.5 g/dL (ref 3.5–5.2)
Alkaline Phosphatase: 40 units/L (ref 39–117)
BUN: 10 mg/dL (ref 6–23)
Blood Glucose, Fingerstick: 171
CO2: 28 meq/L (ref 19–32)
Calcium: 8.8 mg/dL (ref 8.4–10.5)
Chloride: 101 meq/L (ref 96–112)
Creatinine, Ser: 0.84 mg/dL (ref 0.40–1.50)
Digitoxin Lvl: 1 ng/mL (ref 0.8–2.0)
Glucose, Bld: 172 mg/dL — ABNORMAL HIGH (ref 70–99)
Hgb A1c MFr Bld: 7.7 %
Potassium: 4.1 meq/L (ref 3.5–5.3)
Sodium: 139 meq/L (ref 135–145)
Total Bilirubin: 0.5 mg/dL (ref 0.3–1.2)
Total Protein: 6.9 g/dL (ref 6.0–8.3)

## 2009-12-02 ENCOUNTER — Encounter: Payer: Self-pay | Admitting: Internal Medicine

## 2009-12-22 ENCOUNTER — Ambulatory Visit: Payer: Self-pay | Admitting: Internal Medicine

## 2009-12-22 LAB — CONVERTED CEMR LAB
BUN: 22 mg/dL (ref 6–23)
Blood Glucose, Fingerstick: 166
CO2: 31 meq/L (ref 19–32)
Calcium: 9.3 mg/dL (ref 8.4–10.5)
Chloride: 101 meq/L (ref 96–112)
Creatinine, Ser: 0.95 mg/dL (ref 0.40–1.50)
Glucose, Bld: 156 mg/dL — ABNORMAL HIGH (ref 70–99)
Hgb A1c MFr Bld: 7.3 %
Potassium: 4.1 meq/L (ref 3.5–5.3)
Sodium: 137 meq/L (ref 135–145)

## 2009-12-25 ENCOUNTER — Telehealth: Payer: Self-pay | Admitting: Internal Medicine

## 2009-12-31 ENCOUNTER — Encounter (INDEPENDENT_AMBULATORY_CARE_PROVIDER_SITE_OTHER): Payer: Self-pay | Admitting: *Deleted

## 2009-12-31 LAB — CONVERTED CEMR LAB
Blood Glucose, AC Dinner: 172 mg/dL
Blood Glucose, AC Lunch: 84 mg/dL

## 2010-01-01 ENCOUNTER — Telehealth (INDEPENDENT_AMBULATORY_CARE_PROVIDER_SITE_OTHER): Payer: Self-pay | Admitting: *Deleted

## 2010-01-01 ENCOUNTER — Encounter (INDEPENDENT_AMBULATORY_CARE_PROVIDER_SITE_OTHER): Payer: Self-pay | Admitting: *Deleted

## 2010-01-28 ENCOUNTER — Encounter: Payer: Self-pay | Admitting: Internal Medicine

## 2010-02-01 ENCOUNTER — Telehealth (INDEPENDENT_AMBULATORY_CARE_PROVIDER_SITE_OTHER): Payer: Self-pay | Admitting: *Deleted

## 2010-02-10 ENCOUNTER — Ambulatory Visit: Payer: Self-pay | Admitting: Internal Medicine

## 2010-02-10 DIAGNOSIS — L259 Unspecified contact dermatitis, unspecified cause: Secondary | ICD-10-CM | POA: Insufficient documentation

## 2010-02-10 LAB — CONVERTED CEMR LAB
Blood Glucose, Fingerstick: 170
Blood Glucose, Home Monitor: 1 mg/dL

## 2010-02-16 ENCOUNTER — Telehealth (INDEPENDENT_AMBULATORY_CARE_PROVIDER_SITE_OTHER): Payer: Self-pay | Admitting: *Deleted

## 2010-02-24 ENCOUNTER — Ambulatory Visit: Payer: Self-pay | Admitting: Internal Medicine

## 2010-03-01 ENCOUNTER — Telehealth: Payer: Self-pay | Admitting: Internal Medicine

## 2010-03-05 ENCOUNTER — Telehealth: Payer: Self-pay | Admitting: Internal Medicine

## 2010-03-08 ENCOUNTER — Encounter: Payer: Self-pay | Admitting: Internal Medicine

## 2010-05-14 ENCOUNTER — Telehealth: Payer: Self-pay | Admitting: Internal Medicine

## 2010-05-19 ENCOUNTER — Ambulatory Visit: Payer: Self-pay | Admitting: Internal Medicine

## 2010-05-19 LAB — CONVERTED CEMR LAB
ALT: 25 units/L (ref 0–53)
AST: 22 units/L (ref 0–37)
Albumin: 4.6 g/dL (ref 3.5–5.2)
Alkaline Phosphatase: 45 units/L (ref 39–117)
BUN: 16 mg/dL (ref 6–23)
Blood Glucose, Fingerstick: 218
CO2: 28 meq/L (ref 19–32)
Calcium: 9.2 mg/dL (ref 8.4–10.5)
Chloride: 97 meq/L (ref 96–112)
Cholesterol: 123 mg/dL (ref 0–200)
Creatinine, Ser: 0.86 mg/dL (ref 0.40–1.50)
Creatinine, Urine: 141.2 mg/dL
Digitoxin Lvl: 0.8 ng/mL (ref 0.8–2.0)
Glucose, Bld: 213 mg/dL — ABNORMAL HIGH (ref 70–99)
HDL: 32 mg/dL — ABNORMAL LOW (ref >39)
Hgb A1c MFr Bld: 6.3 %
LDL Cholesterol: 40 mg/dL (ref 0–99)
Microalb Creat Ratio: 130.8 mg/g — ABNORMAL HIGH (ref 0.0–30.0)
Microalb, Ur: 18.47 mg/dL — ABNORMAL HIGH (ref 0.00–1.89)
Potassium: 4 meq/L (ref 3.5–5.3)
Sodium: 137 meq/L (ref 135–145)
Total Bilirubin: 0.4 mg/dL (ref 0.3–1.2)
Total CHOL/HDL Ratio: 3.8
Total Protein: 6.8 g/dL (ref 6.0–8.3)
Triglycerides: 257 mg/dL — ABNORMAL HIGH (ref <150)
VLDL: 51 mg/dL — ABNORMAL HIGH (ref 0–40)

## 2010-05-21 ENCOUNTER — Encounter: Payer: Self-pay | Admitting: Internal Medicine

## 2010-05-21 DIAGNOSIS — I5042 Chronic combined systolic (congestive) and diastolic (congestive) heart failure: Secondary | ICD-10-CM

## 2010-05-21 DIAGNOSIS — I429 Cardiomyopathy, unspecified: Secondary | ICD-10-CM | POA: Insufficient documentation

## 2010-05-21 HISTORY — DX: Chronic combined systolic (congestive) and diastolic (congestive) heart failure: I50.42

## 2010-06-22 ENCOUNTER — Emergency Department (HOSPITAL_COMMUNITY): Admission: EM | Admit: 2010-06-22 | Discharge: 2010-06-22 | Payer: Self-pay | Admitting: Emergency Medicine

## 2010-08-03 ENCOUNTER — Encounter: Payer: Self-pay | Admitting: Internal Medicine

## 2010-08-03 LAB — HM DIABETES EYE EXAM

## 2010-09-05 ENCOUNTER — Encounter: Payer: Self-pay | Admitting: Otolaryngology

## 2010-09-14 NOTE — Miscellaneous (Signed)
Summary: MEDICAL EVALUATION  [name]  MEDICAL EVALUATION   [facility]   Imported By: Enedina Finner 05/21/2010 14:16:26  _____________________________________________________________________  External Attachment:    Type:   Image     Comment:   External Document

## 2010-09-14 NOTE — Assessment & Plan Note (Signed)
Summary: est-ck/fu/meds/cfb   Vital Signs:  Patient profile:   55 year old male Height:      71 inches Weight:      255 pounds BMI:     35.69 O2 Sat:      97 % on Room air Temp:     98.4 degrees F oral Pulse rate:   75 / minute BP sitting:   145 / 78  (right arm)  Vitals Entered By: Dwan Bolt Cobb  O2 Flow:  Room air CC: F/U, Spot groin area right side / itches Is Patient Diabetic? Yes Did you bring your meter with you today? Yes Pain Assessment Patient in pain? no      Nutritional Status BMI of > 30 = obese CBG Result 170  Have you ever been in a relationship where you felt threatened, hurt or afraid?No   Does patient need assistance? Functional Status Self care Ambulation Normal   Primary Care Provider:  Bertha Stakes MD  CC:  F/U and Spot groin area right side / itches.  History of Present Illness: Patient returns for follow-up of his diabetes mellitus and other chronic medical problems.  He has no acute complaints today. He did mention an area on his right side that itches and occasionally has scaling skin present; he has been using a topical antibiotic on that area. He saw our diabetes educator Barnabas Harries today prior to seeing me. He reports that he has done well on Byetta, although his glucose meter has shown a few lows.  He reports that he is compliant with his medications.   Preventive Screening-Counseling & Management  Alcohol-Tobacco     Alcohol drinks/day: 0     Smoking Status: never  Current Medications (verified): 1)  Lisinopril 40 Mg Tabs (Lisinopril) .... Take 1 Tablet By Mouth Once A Day 2)  Lasix 40 Mg Tabs (Furosemide) .... Take 1and 1/2 Tablets By Mouth Once A Day 3)  Glipizide 10 Mg Tabs (Glipizide) .... Take 1/2 Tablet By Mouth Two Times A Day 4)  Aspirin 81 Mg Tbec (Aspirin) .... Take 1 Tablet By Mouth Once A Day 5)  Digoxin 0.25 Mg Tabs (Digoxin) .... Take 1 Tablet By Mouth Once A Day 6)  Actos 45 Mg Tabs (Pioglitazone Hcl) .... Take 1 Tablet  By Mouth Once A Day 7)  Coreg 25 Mg Tabs (Carvedilol) .... Take 1 Tablet By Mouth Two Times A Day 8)  Metformin Hcl 1000 Mg Tabs (Metformin Hcl) .... Take 1 Tablet By Mouth Two Times A Day 9)  Nitroglycerin 0.4 Mg Subl (Nitroglycerin) .... Place 1 Tablet Under Tongue As Needed For Chest Pain 10)  Norvasc 10 Mg Tabs (Amlodipine Besylate) .... Take 1 Tablet By Mouth Once A Day 11)  Gabapentin 300 Mg Caps (Gabapentin) .... Take 1 Capsule By Mouth Once A Day 12)  Pravastatin Sodium 40 Mg  Tabs (Pravastatin Sodium) .... Take 1 Tablet By Mouth Once A Day 13)  Clonidine Hcl 0.1 Mg  Tabs (Clonidine Hcl) .... Take 3 Tablets By Mouth Two Times A Day 14)  Freestyle Test   Strp (Glucose Blood) .... To Test 1- 2x Daily 15)  Lancets   Misc (Lancets) .... Use To Test Blood Sugar Three Times A Day 16)  Multivitamins   Tabs (Multiple Vitamin) .[name].. 1 Tab By Mouth Once Daily 17)  Byetta 5 Mcg Pen 5 Mcg/0.86ml Soln (Exenatide) .... Inject 5 Micrograms Subcutaneously Two Times A Day Within 60 Minutes Prior To A Meal 18)  Prodigy Mini  Pen Needles 31g X 5 Mm Misc (Insulin Pen Needle) .... Use As Directed For Byetta Injections Two Times A Day. Dx:250.00  Allergies (verified): No Known Drug Allergies  Physical Exam  General:  alert, no distress Lungs:  normal respiratory effort, normal breath sounds, no crackles, and no wheezes.   Heart:  normal rate, regular rhythm, no murmur, no gallop, and no rub.   Extremities:  no edema Skin:  there is a small patch of thickened mildly scaling skin about 3 cm diameter on patient's right side   Impression & Recommendations:  Problem # 1:  DIABETES MELLITUS, TYPE II (ICD-250.00) Patient is doing well on Byetta; he has had a few low measurements on his home meter, but these have been asymptomatic. I would like to discuss this further with Barnabas Harries prior to deciding about increasing his Byetta and tapering his oral medications. Plan is to discuss this with her and then  contact patient with instructions; he has about 2 weeks left of his current Byetta dose.  His updated medication list for this problem includes:    Lisinopril 40 Mg Tabs (Lisinopril) .[name]... Take 1 tablet by mouth once a day    Glipizide 10 Mg Tabs (Glipizide) .[name]... Take 1/2 tablet by mouth two times a day    Aspirin 81 Mg Tbec (Aspirin) .[name]... Take 1 tablet by mouth once a day    Actos 45 Mg Tabs (Pioglitazone hcl) .[name]... Take 1 tablet by mouth once a day    Metformin Hcl 1000 Mg Tabs (Metformin hcl) .[name]... Take 1 tablet by mouth two times a day    Byetta 5 Mcg Pen 5 Mcg/0.53ml Soln (Exenatide) ..... Inject 5 micrograms subcutaneously two times a day within 60 minutes prior to a meal  Orders: Capillary Blood Glucose/CBG RC:[id number])  Labs Reviewed: Creat: 0.95 (12/22/2009)     Last Eye Exam: No background diabetic retinopathy.   Visual acuity OD (uncorrected):  20/20 Visual acuity OS (uncorrected):  20/20  Intraocular pressure OD: 17     Intraocular pressure OS:  17  Exam by Herbie Baltimore L. Katy Fitch, MD  (07/29/2008) Reviewed HgBA1c results: 7.3 (12/22/2009)  7.7 (11/11/2009)  Problem # 2:  HYPERLIPIDEMIA (ICD-272.4) Patient is doing well without apparent side effects on his current regimen.  His updated medication list for this problem includes:    Pravastatin Sodium 40 Mg Tabs (Pravastatin sodium) .[name]... 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  His updated medication list for this problem includes:    Lisinopril 40 Mg Tabs (Lisinopril) .[name]... Take 1 tablet by mouth once a day    Glipizide 10 Mg Tabs (Glipizide) .[name]... Take 1/2 tablet by mouth two times a day    Aspirin 81 Mg  Tbec (Aspirin) .[name]... Take 1 tablet by mouth once a day    Actos 45 Mg Tabs (Pioglitazone hcl) .[name]... Take 1 tablet by mouth once a day    Metformin Hcl 1000 Mg Tabs (Metformin hcl) .[name]... Take 1 tablet by mouth two times a day    Byetta 5 Mcg Pen 5 Mcg/0.18ml Soln (Exenatide) ..... Inject 5 micrograms subcutaneously two times a day within 60 minutes prior to a meal  Problem # 4:  DERMATITIS (ICD-692.9) Patient  has a small area of thickened mildly scaling skin on his right side; this appears to be a focal area of eczema. Plan is to treat with topical triamcinolone 0.1% cream twice a day as below for 10 days. I advised patient to let me know if this persists or worsens.  His updated medication list for this problem includes:    Triamcinolone Acetonide 0.1 % Crea (Triamcinolone acetonide) .[name]... Apply thin film to affected area two times a day  Complete Medication List: 1)  Lisinopril 40 Mg Tabs (Lisinopril) .... Take 1 tablet by mouth once a day 2)  Lasix 40 Mg Tabs (Furosemide) .... Take 1and 1/2 tablets by mouth once a day 3)  Glipizide 10 Mg Tabs (Glipizide) .... Take 1/2 tablet by mouth two times a day 4)  Aspirin 81 Mg Tbec (Aspirin) .... Take 1 tablet by mouth once a day 5)  Digoxin 0.25 Mg Tabs (Digoxin) .... Take 1 tablet by mouth once a day 6)  Actos 45 Mg Tabs (Pioglitazone hcl) .... Take 1 tablet by mouth once a day 7)  Coreg 25 Mg Tabs (Carvedilol) .... Take 1 tablet by mouth two times a day 8)  Metformin Hcl 1000 Mg Tabs (Metformin hcl) .... Take 1 tablet by mouth two times a day 9)  Nitroglycerin 0.4 Mg Subl (Nitroglycerin) .... Place 1 tablet under tongue as needed for chest pain 10)  Norvasc 10 Mg Tabs (Amlodipine besylate) .... Take 1 tablet by mouth once a day 11)  Gabapentin 300 Mg Caps (Gabapentin) .... Take 1 capsule by mouth once a day 12)  Pravastatin Sodium 40 Mg Tabs (Pravastatin sodium) .... Take 1 tablet by mouth once a day 13)  Clonidine Hcl 0.1 Mg Tabs (Clonidine hcl)  .... Take 3 tablets by mouth two times a day 14)  Freestyle Test Strp (Glucose blood) .... To test 1- 2x daily 15)  Lancets Misc (Lancets) .... Use to test blood sugar three times a day 16)  Multivitamins Tabs (Multiple vitamin) .[name].. 1 tab by mouth once daily 17)  Byetta 5 Mcg Pen 5 Mcg/0.95ml Soln (Exenatide) .... Inject 5 micrograms subcutaneously two times a day within 60 minutes prior to a meal 18)  Prodigy Mini Pen Needles 31g X 5 Mm Misc (Insulin pen needle) .... Use as directed for byetta injections two times a day. dx:250.00 19)  Triamcinolone Acetonide 0.1 % Crea (Triamcinolone acetonide) .... Apply thin film to affected area two times a day  Patient Instructions: 1)  Please schedule a follow-up appointment in 2 months. 2)  Use triamcinolone 0.1% cream as directed; apply a thin film to affected area on abdomen two times a day for 10 days; call if there is no improvement.  Prescriptions: TRIAMCINOLONE ACETONIDE 0.1 % CREA (TRIAMCINOLONE ACETONIDE) Apply thin film to affected area two times a day  #15 grams x 0   Entered and Authorized by:   Bertha Stakes MD   Signed by:   Bertha Stakes MD on 02/10/2010   Method used:   Electronically to        [facility] [phone]* (retail)       [address]       [place], [place]  [address]       Ph: [phone] or [phone]       Fax: [phone]   RxID:   [id number]    Prevention & Chronic Care Immunizations   Influenza vaccine: Not documented   Influenza  vaccine deferral: Not available  (03/26/2009)   Influenza vaccine due: 04/15/2010    Tetanus booster: Not documented    Pneumococcal vaccine: Not documented  Colorectal Screening   Hemoccult: Negative  (10/08/2002)   Hemoccult action/deferral: Ordered  (11/11/2009)   Hemoccult due: 10/09/2003    Colonoscopy: Location:  [facility].    (02/06/2008)   Colonoscopy due: 02/2011  Other Screening   PSA: Not documented   PSA action/deferral:  Discussed-PSA declined  (03/26/2009)   Smoking status: never  (02/10/2010)  Diabetes Mellitus   HgbA1C: 7.3  (12/22/2009)   Hemoglobin A1C due: 02/11/2010    Eye exam: No background diabetic retinopathy.   Visual acuity OD (uncorrected):  20/20 Visual acuity OS (uncorrected):  20/20  Intraocular pressure OD: 17     Intraocular pressure OS:  17  Exam by Herbie Baltimore L. Katy Fitch, MD   (07/29/2008)   Eye exam due: 07/29/2009    Foot exam: yes  (11/11/2009)   Foot exam action/deferral: Do today   High risk foot: Yes  (11/11/2009)   Foot care education: Done  (11/11/2009)   Foot exam due: 03/26/2010    Urine microalbumin/creatinine ratio: 244.1  (03/26/2009)   Urine microalbumin action/deferral: Ordered   Urine microalbumin/cr due: 03/26/2010    Diabetes flowsheet reviewed?: Yes   Progress toward A1C goal: Unchanged  Lipids   Total Cholesterol: 153  (03/26/2009)   Lipid panel action/deferral: Lipid Panel ordered   LDL: 70  (03/26/2009)   LDL Direct: Not documented   HDL: 41  (03/26/2009)   Triglycerides: 209  (03/26/2009)   Lipid panel due: 03/26/2010    SGOT (AST): 18  (11/11/2009)   BMP action: Ordered   SGPT (ALT): 19  (11/11/2009)   Alkaline phosphatase: 40  (11/11/2009)   Total bilirubin: 0.5  (11/11/2009)   Liver panel due: 05/14/2010    Lipid flowsheet reviewed?: Yes   Progress toward LDL goal: At goal  Hypertension   Last Blood Pressure: 145 / 78  (02/10/2010)   Serum creatinine: 0.95  (12/22/2009)   BMP action: Ordered   Serum potassium 4.1  (Q000111Q)   Basic metabolic panel due: Q000111Q    Hypertension flowsheet reviewed?: Yes   Progress toward BP goal: Unchanged  Self-Management Support :   Personal Goals (by the next clinic visit) :     Personal A1C goal: 7  (03/26/2009)     Personal blood pressure goal: 130/80  (03/26/2009)     Personal LDL goal: 100  (03/26/2009)    Patient will work on the following items until the next clinic visit to reach  self-care goals:     Medications and monitoring: take my medicines every day, check my blood sugar, examine my feet every day  (02/10/2010)     Eating: drink diet soda or water instead of juice or soda, eat more vegetables, eat foods that are low in salt  (02/10/2010)     Activity: take a 30 minute walk every day, park at the far end of the parking lot  (02/10/2010)     Home glucose monitoring frequency: 1 time daily  (03/26/2009)    Diabetes self-management support: Copy of home glucose meter record, Education handout, Pre-printed educational material, Written self-care plan  (02/10/2010)   Diabetes care plan printed   Diabetes education handout printed   Last diabetes self-management training by diabetes educator: 12/22/2009    Hypertension self-management support: Copy of home glucose meter record, Education handout, Pre-printed educational material, Written self-care plan  (02/10/2010)  Hypertension self-care plan printed.   Hypertension education handout printed    Lipid self-management support: Copy of home glucose meter record, Education handout, Pre-printed educational material, Written self-care plan  (02/10/2010)   Lipid self-care plan printed.   Lipid education handout printed

## 2010-09-14 NOTE — Progress Notes (Signed)
Summary: diabetes testing and dm shoes/dmr  Phone Note Outgoing Call   Call placed by: Barnabas Harries RD,CDE,  February 01, 2010 3:50 PM Summary of Call: received papaers from [facility]- patient already had paperwork completed & faxed for [place] connections. called and left message about which company patient desires to use and to encourage him to stay with 1 company per year.   Follow-up for Phone Call        have not heard back form patient.  Follow-up by: Barnabas Harries RD,CDE,  February 03, 2010 2:18 PM

## 2010-09-14 NOTE — Assessment & Plan Note (Signed)
Summary: DIABETES TEACHING/CFB   Vital Signs:  Patient profile:   55 year old male Weight:      255.8 pounds BMI:     35.81 Is Patient Diabetic? Yes Did you bring your meter with you today? Yes CBG Device ID Prodigy Autocode Comments 148 this morning at home before breakfast   Allergies: No Known Drug Allergies   Complete Medication List: 1)  Lisinopril 40 Mg Tabs (Lisinopril) .... Take 1 tablet by mouth once a day 2)  Lasix 40 Mg Tabs (Furosemide) .... Take 1and 1/2 tablets by mouth once a day 3)  Glipizide 10 Mg Tabs (Glipizide) .... Take 1/2 tablet by mouth two times a day 4)  Aspirin 81 Mg Tbec (Aspirin) .... Take 1 tablet by mouth once a day 5)  Digoxin 0.25 Mg Tabs (Digoxin) .... Take 1 tablet by mouth once a day 6)  Actos 45 Mg Tabs (Pioglitazone hcl) .... Take 1 tablet by mouth once a day 7)  Coreg 25 Mg Tabs (Carvedilol) .... Take 1 tablet by mouth two times a day 8)  Metformin Hcl 1000 Mg Tabs (Metformin hcl) .... Take 1 tablet by mouth two times a day 9)  Nitroglycerin 0.4 Mg Subl (Nitroglycerin) .... Place 1 tablet under tongue as needed for chest pain 10)  Norvasc 10 Mg Tabs (Amlodipine besylate) .... Take 1 tablet by mouth once a day 11)  Gabapentin 300 Mg Caps (Gabapentin) .... Take 1 capsule by mouth once a day 12)  Pravastatin Sodium 40 Mg Tabs (Pravastatin sodium) .... Take 1 tablet by mouth once a day 13)  Clonidine Hcl 0.1 Mg Tabs (Clonidine hcl) .... Take 3 tablets by mouth two times a day 14)  Freestyle Test Strp (Glucose blood) .... To test 1- 2x daily 15)  Lancets Misc (Lancets) .... Use to test blood sugar three times a day 16)  Multivitamins Tabs (Multiple vitamin) .[name].. 1 tab by mouth once daily 17)  Byetta 5 Mcg Pen 5 Mcg/0.38ml Soln (Exenatide) .... Inject 5 micrograms subcutaneously two times a day within 60 minutes prior to a meal 18)  Prodigy Mini Pen Needles 31g X 5 Mm Misc (Insulin pen needle) .... Use as directed for byetta injections two times a  day. dx:250.00  Other Orders: DSMT(Medicare) Individual, 30 Minutes (G0108)  Diabetes Self Management Training  PCP: Bertha Stakes MD Date diagnosed with diabetes: 08/15/1996 Diabetes Type: Type 2 non-insulin Other persons present: no Current smoking Status: never  Vital Signs Todays Weight: 255.8lb  in BMI 35.81in-lbs   Assessment Daily activities: school and work Sources of Support: used to walk wiht wife- when she stoped, he stopped, she used to use artificaila sweetner in tea, but she stopped due to belief that the artificail sweetner was [employer] Special needs or Barriers: He wants to strart exercising but is finding that things keep getting in his way-   Potential Barriers  Economic/Supplies  Needs review/assistance  Diabetes Medications:  Herbs or Supplements: No Comments: meter average: 7 day:163,  14 day:169,  28 day:169, had several  LOWS on meter- he reports no symptoms-per meter manual this means < 20 which I doubt- asked patient to wash site and repeat test when this happens he is using alternatie site testing and this tesnds to run higher rather than lower. Unsure why tolerating Byetta, still eating dinner late at night ( 10 PM) -takes Byetta at this time and before breakfast, still having some difficulty with self injections did not want to practice today.  hasn't      Monitoring Self monitoring blood glucose 1 time a day Name of Meter  Prodigy Autocode     Estimated /Usual Carb Intake Breakfast # of Carbs/Grams canned biscuits x3 regular, egg, bacon or sausage 2-3 times a week sometimes or oatmeal or cereal- cheerios Midmorning # of Carbs/Grams fruit evyerday if you have it & like it- water melon- estimate every other day. Lunch # of Carbs/Grams nothing- sweet tea sometimes Dinner # of Carbs/Grams bowl of beans with smoked [place], water  Nutrition assessment What beverages do you drink?  sweet tea- 4-5 days a week sometimes throughout the day,  water, diet drinks  Activity Limitations  Inadequate physical activity  Type of physical activity  Other  patient thinking about walking again like he used to n the mornings Diabetes Disease Process  Discussed today State own type of diabetes: Demonstrates competencyState diabetes is treated by meal plan-exercise-medication-monitoring-education: Demonstrates competency Medications State appropriate timing of food related to medication: Demonstrates competencyDemonstrates/verbalizes site selection and rotation for injections Needs review/assistanceCorrectly draw up and administer insulin-Byetta-Symlin-glucagon: Needs review/assistance Nutritional Management Identify what foods most often affect blood glucose: Needs review/assistance    Verbalize importance of controlling food portions: Needs review/assistance   State changes planned for home meals/snacks: Needs review/assistance    Monitoring Perform glucose monitoring/ketone testing and record results correctly: Needs review/assistance Complications State the causes- signs and symptoms and prevention of hypoglycemia: Needs review/assistance   Explain proper treatment of hypoglycemia: Demonstrates competency    Exercise States importance of exercise: Demonstrates competency   States effect of exercise on blood glucose: Needs review/assistance   Verbalizes safety measures for exercise related to diabetes: Needs review/assistance    Psychosocial Adjustment State three common feelings that might be experienced when learning to cope with diabetes: Needs review/assistanceIdentify two methods to cope with these feelings: Needs review/assistanceIdentify how stress affects diabetes & two sources of stress: Needs review/assistanceDiabetes Management Education Done: 02/03/2010    BEHAVIORAL GOALS INITIAL Monitoring blood glucose levels daily: repeat blood sugar test when you get  reading of "low"        watches TV at night. Discussed  various supply compnaies- patient unsure which he'd like to use or where he is getting supplies. No weight change, but patient reports some affect on Pm appetite. He seems ready to increase to 10 micrograms Byetta twice daily. Suggeste increased self monitoring before lunch and dinner to assist with transition and evaluate need for a decrease in glipizide.  Diabetes Self Management Support:wife and clinic staff Follow-up:was not ready to set a specific goal for physical activity today- is working on dealing with barriers, suggest re-evaluation in 3 months for readiness to change

## 2010-09-14 NOTE — Progress Notes (Signed)
Summary: REfill/gh  Phone Note Refill Request Message from:  Fax from Pharmacy on Dec 25, 2009 4:35 PM  Refills Requested: Medication #1:  PRODIGY MINI PEN NEEDLES 31G X 5 MM MISC Use as directed for Byetta injections two times a day. Dx:250.00. pt wants the smallest pens 3/16   Method Requested: Electronic Initial call taken by: Sander Nephew RN,  Dec 25, 2009 4:37 PM  Follow-up for Phone Call        That is what I had prescribed.  I called the pharmacy and confirmed this; they will contact the patient. Follow-up by: Bertha Stakes MD,  Dec 25, 2009 5:41 PM

## 2010-09-14 NOTE — Assessment & Plan Note (Signed)
Summary: EST-6 WEEK RECHECK/CH   Vital Signs:  Patient profile:   55 year old male Height:      71 inches Weight:      252.4 pounds BMI:     35.33 Temp:     97.9 degrees F oral Pulse rate:   63 / minute BP sitting:   150 / 83  (right arm)  Vitals Entered By: Silverio Decamp NT II (Dec 22, 2009 2:34 PM) Is Patient Diabetic? No Pain Assessment Patient in pain? no      Nutritional Status BMI of > 30 = obese CBG Result 166  Have you ever been in a relationship where you felt threatened, hurt or afraid?No   Does patient need assistance? Functional Status Self care Ambulation Normal   Primary Care Provider:  Bertha Stakes MD   History of Present Illness: Patient returns for follow-up of his diabetes mellitus, hypertension, lower extremity edema, and other chronic medical problems.  His lower extremity edema has improved although he still has a small amount of swelling.  He reports that he is compliant with his medications.  He has met with our diabetes educator Barnabas Harries, and he is interested in starting Byetta because of the good experience of several acquaintances of his with Byetta and because he would like to lose weight.    Preventive Screening-Counseling & Management  Alcohol-Tobacco     Alcohol drinks/day: 0     Smoking Status: never  Allergies: No Known Drug Allergies  Physical Exam  General:  alert, no distress Lungs:  normal respiratory effort, normal breath sounds, no crackles, and no wheezes.   Heart:  normal rate, regular rhythm, no murmur, no gallop, and no rub.   Abdomen:  soft, non-tender, normal bowel sounds, no hepatomegaly, and no splenomegaly.   Extremities:  trace bilateral ankle edema   Impression & Recommendations:  Problem # 1:  DIABETES MELLITUS, TYPE II (ICD-250.00) Patient's hemoglobin A1c is still above target range, and he has some persisting ankle edema. I discussed with him the options of either adding a long-acting insulin or an  alternative such as Byetta. I discussed in detail with him the potential side effects of Byetta.  Because of his interest in weight loss, he prefers to try Byetta rather than a long-acting insulin. He will see our diabetes educator for instruction in blood glucose monitoring and self injection, and then start Byetta 5 micrograms twice a day. I advised him to cut his glipizide dose in half in order to avoid possible hypoglycemia. The plan is to eventually taper and hopefully stop his Actos given his peripheral edema and history of cardiomyopathy, although his last ejection fraction was much improved. I advised patient to notify us if he has any apparent side effects, including any symptoms of nausea, GI upset, abdominal pain, or any other problems.  His updated medication list for this problem includes:    Lisinopril 40 Mg Tabs (Lisinopril) .[name]... Take 1 tablet by mouth once a day    Glipizide 10 Mg Tabs (Glipizide) .[name]... Take 1/2 tablet by mouth two times a day    Aspirin 81 Mg Tbec (Aspirin) .[name]... Take 1 tablet by mouth once a day    Actos 45 Mg Tabs (Pioglitazone hcl) .[name]... Take 1 tablet by mouth once a day    Metformin Hcl 1000 Mg Tabs (Metformin hcl) .[name]... Take 1 tablet by mouth two times a day    Byetta 5 Mcg Pen 5 Mcg/0.62ml Soln (Exenatide) ..... Inject 5 micrograms  subcutaneously two times a day within 60 minutes prior to a meal  Orders: T-Hgb A1C (in-house) [id number]) T- Capillary Blood Glucose [id number])  Labs Reviewed: Creat: 0.84 (11/11/2009)     Last Eye Exam: No background diabetic retinopathy.   Visual acuity OD (uncorrected):  20/20 Visual acuity OS (uncorrected):  20/20  Intraocular pressure OD: 17     Intraocular pressure OS:  17  Exam by Herbie Baltimore L. Katy Fitch, MD  (07/29/2008) Reviewed HgBA1c results: 7.3 (12/22/2009)  7.7 (11/11/2009)  Problem # 2:  HYPERTENSION (ICD-401.9) Patient's systolic blood pressure is mildly elevated today; if this persists, then will adjust his  antihypertensive regimen.  His updated medication list for this problem includes:    Lisinopril 40 Mg Tabs (Lisinopril) .[name]... Take 1 tablet by mouth once a day    Lasix 40 Mg Tabs (Furosemide) .[name]... Take 1and 1/2 tablets by mouth once a day    Coreg 25 Mg Tabs (Carvedilol) .[name]... Take 1 tablet by mouth two times a day    Norvasc 10 Mg Tabs (Amlodipine besylate) .[name]... Take 1 tablet by mouth once a day    Clonidine Hcl 0.1 Mg Tabs (Clonidine hcl) .[name]... Take 3 tablets by mouth two times a day  BP today: 150/83 Prior BP: 134/82 (11/11/2009)  Labs Reviewed: K+: 4.1 (11/11/2009) Creat: : 0.84 (11/11/2009)   Chol: 153 (03/26/2009)   HDL: 41 (03/26/2009)   LDL: 70 (03/26/2009)   TG: 209 (03/26/2009)  Problem # 3:  HYPERLIPIDEMIA (ICD-272.4) Patient's LDL is at goal on current dose of pravastatin; will continue as before.  His updated medication list for this problem includes:    Pravastatin Sodium 40 Mg Tabs (Pravastatin sodium) .[name]... Take 1 tablet by mouth once a day  Labs Reviewed: SGOT: 18 (11/11/2009)   SGPT: 19 (11/11/2009)   HDL:41 (03/26/2009), 42 (07/18/2008)  LDL:70 (03/26/2009), 86 (07/18/2008)  Chol:153 (03/26/2009), 177 (07/18/2008)  Trig:209 (03/26/2009), 243 (07/18/2008)  Problem # 4:  CONGESTIVE HEART FAILURE (ICD-428.0) Patient's lower extremity edema has improved but not completely resolved with the increased dose of Lasix. The plan is to continue current medication regimen, and check a basic metabolic panel today.  His updated medication list for this problem includes:    Lisinopril 40 Mg Tabs (Lisinopril) .[name]... Take 1 tablet by mouth once a day    Lasix 40 Mg Tabs (Furosemide) .[name]... Take 1and 1/2 tablets by mouth once a day    Aspirin 81 Mg Tbec (Aspirin) .[name]... Take 1 tablet by mouth once a day    Digoxin 0.25 Mg Tabs (Digoxin) .[name]... Take 1 tablet by mouth once a day    Coreg 25 Mg Tabs (Carvedilol) .[name]... Take 1 tablet by mouth two times a day  Orders: T-Basic Metabolic  Panel ([id number])  Complete Medication List: 1)  Lisinopril 40 Mg Tabs (Lisinopril) .... Take 1 tablet by mouth once a day 2)  Lasix 40 Mg Tabs (Furosemide) .... Take 1and 1/2 tablets by mouth once a day 3)  Glipizide 10 Mg Tabs (Glipizide) .... Take 1/2 tablet by mouth two times a day 4)  Aspirin 81 Mg Tbec (Aspirin) .... Take 1 tablet by mouth once a day 5)  Digoxin 0.25 Mg Tabs (Digoxin) .... Take 1 tablet by mouth once a day 6)  Actos 45 Mg Tabs (Pioglitazone hcl) .... Take 1 tablet by mouth once a day 7)  Coreg 25 Mg Tabs (Carvedilol) .... Take 1 tablet by mouth two times a day 8)  Metformin Hcl 1000 Mg Tabs (Metformin  hcl) .... Take 1 tablet by mouth two times a day 9)  Nitroglycerin 0.4 Mg Subl (Nitroglycerin) .... Place 1 tablet under tongue as needed for chest pain 10)  Norvasc 10 Mg Tabs (Amlodipine besylate) .... Take 1 tablet by mouth once a day 11)  Gabapentin 300 Mg Caps (Gabapentin) .... Take 1 capsule by mouth once a day 12)  Pravastatin Sodium 40 Mg Tabs (Pravastatin sodium) .... Take 1 tablet by mouth once a day 13)  Clonidine Hcl 0.1 Mg Tabs (Clonidine hcl) .... Take 3 tablets by mouth two times a day 14)  Freestyle Test Strp (Glucose blood) .... To test 1- 2x daily 15)  Lancets Misc (Lancets) .... Use to test blood sugar three times a day 16)  Multivitamins Tabs (Multiple vitamin) .[name].. 1 tab by mouth once daily 17)  Byetta 5 Mcg Pen 5 Mcg/0.28ml Soln (Exenatide) .... Inject 5 micrograms subcutaneously two times a day within 60 minutes prior to a meal 18)  Prodigy Mini Pen Needles 31g X 5 Mm Misc (Insulin pen needle) .... Use as directed for byetta injections two times a day. dx:250.00  Patient Instructions: 1)  Please schedule a follow-up appointment in 1 month. 2)  Once you obtain your blood glucose meter and are instructed in its use, start Byetta 5 micrograms subcutaneously two times a day within 60 minutes prior to a meal. 3)  Decrease glipizide to one-half of a 10  mg tablet two times a day. 4)  Call if you have any nausea, abdominal pain, or other apparent problems after starting the Byetta.    Prescriptions: PRODIGY MINI PEN NEEDLES 31G X 5 MM MISC (INSULIN PEN NEEDLE) Use as directed for Byetta injections two times a day. Dx:250.00  #100 x 5   Entered and Authorized by:   Bertha Stakes MD   Signed by:   Bertha Stakes MD on 12/22/2009   Method used:   Electronically to        [facility]. [phone]* (retail)       [address]       [place], [place]  [address]       Ph: [phone] or [phone]       Fax: [phone]   RxID:   [id number] BYETTA 5 MCG PEN 5 MCG/0.02ML SOLN (EXENATIDE) Inject 5 micrograms subcutaneously two times a day within 60 minutes prior to a meal  #1 pen x 5   Entered and Authorized by:   Bertha Stakes MD   Signed by:   Bertha Stakes MD on 12/22/2009   Method used:   Electronically to        [facility]. [phone]* (retail)       [address]       [place], [place]  [address]       Ph: [phone] or [phone]       Fax: [phone]   RxID:   [id number] LASIX 40 MG TABS (FUROSEMIDE) Take 1and 1/2 tablets by mouth once a day  #135 x 1   Entered and Authorized by:   Bertha Stakes MD   Signed by:   Bertha Stakes MD on 12/22/2009   Method used:   Electronically to        [facility]. [phone]* (retail)       [address]       [place], [place]  [address]       Ph: [phone] or [phone]       Fax: [phone]   RxID:   [id number]   Prevention & Chronic Care Immunizations   Influenza vaccine: Not documented   Influenza vaccine deferral: Not available  (03/26/2009)   Influenza vaccine due: 04/15/2010    Tetanus booster: Not documented    Pneumococcal vaccine: Not documented  Colorectal Screening   Hemoccult: Negative  (10/08/2002)   Hemoccult action/deferral: Ordered  (11/11/2009)   Hemoccult due: 10/09/2003    Colonoscopy: Location:  [facility].    (02/06/2008)   Colonoscopy due: 02/2011  Other Screening   PSA: Not documented   PSA action/deferral: Discussed-PSA declined  (03/26/2009)   Smoking status: never  (12/22/2009)  Diabetes Mellitus   HgbA1C: 7.3  (12/22/2009)   Hemoglobin A1C due: 02/11/2010    Eye exam: No background diabetic retinopathy.   Visual acuity OD (uncorrected):  20/20 Visual acuity OS (uncorrected):  20/20  Intraocular pressure OD: 17     Intraocular pressure OS:  17  Exam by Herbie Baltimore L. Katy Fitch, MD   (07/29/2008)   Eye exam due: 07/29/2009    Foot exam: yes  (11/11/2009)   Foot exam action/deferral: Do today   High risk foot: Yes  (11/11/2009)   Foot care education: Done  (11/11/2009)   Foot exam due: 03/26/2010    Urine microalbumin/creatinine ratio: 244.1  (03/26/2009)   Urine microalbumin action/deferral: Ordered   Urine microalbumin/cr due: 03/26/2010  Lipids   Total Cholesterol: 153  (03/26/2009)   Lipid panel action/deferral: Lipid Panel ordered   LDL: 70  (03/26/2009)   LDL Direct: Not documented   HDL: 41  (03/26/2009)   Triglycerides: 209  (03/26/2009)   Lipid panel due: 03/26/2010    SGOT (AST): 18  (11/11/2009)   BMP action: Ordered   SGPT (ALT): 19  (11/11/2009)   Alkaline phosphatase: 40  (11/11/2009)   Total bilirubin: 0.5  (11/11/2009)   Liver panel due: 05/14/2010  Hypertension   Last Blood Pressure: 150 / 83  (12/22/2009)   Serum creatinine: 0.84  (11/11/2009)   BMP action: Ordered   Serum potassium 4.1  (A999333)   Basic metabolic panel due: Q000111Q  Self-Management Support :   Personal Goals (by the next clinic visit) :     Personal A1C goal: 7  (03/26/2009)     Personal blood pressure goal: 130/80  (03/26/2009)     Personal LDL goal: 100  (03/26/2009)    Patient will work on the following items until the next clinic visit to reach self-care goals:     Medications and monitoring: take my medicines every day, check my blood sugar,  examine my feet every day  (12/22/2009)     Eating: drink diet soda or water instead of juice or soda, eat more vegetables, use fresh or frozen vegetables, eat baked foods instead of fried foods, eat fruit for snacks and desserts, limit or avoid alcohol  (12/22/2009)     Activity: take the stairs instead of the elevator  (12/22/2009)     Home glucose monitoring frequency: 1 time daily  (03/26/2009)    Diabetes self-management support: Written self-care plan  (11/11/2009)    Hypertension self-management support: Written self-care plan  (11/11/2009)    Lipid self-management support: Written self-care plan  (11/11/2009)   Laboratory Results   Blood Tests   Date/Time Received: Dec 22, 2009 3:26 PM Date/Time Reported: Maryan Rued  Dec 22, 2009 3:26  PM  HGBA1C: 7.3%   (Normal Range: Non-Diabetic - 3-6%   Control Diabetic - 6-8%) CBG Random:: 166mg /dL     Process Orders Check Orders Results:     [facility]: Check successful Tests Sent for requisitioning (Dec 25, 2009 6:13 PM):     12/22/2009: [facility] -- T-Basic Metabolic Panel 0000000 (signed)

## 2010-09-14 NOTE — Letter (Signed)
Summary: DIABETES-BLOOD GLUCOSE REPORT  DIABETES-BLOOD GLUCOSE REPORT   Imported By: Enedina Finner 03/08/2010 16:05:26  _____________________________________________________________________  External Attachment:    Type:   Image     Comment:   External Document

## 2010-09-14 NOTE — Assessment & Plan Note (Signed)
Summary: DM TEACHING/CH   Allergies: No Known Drug Allergies   Complete Medication List: 1)  Lisinopril 40 Mg Tabs (Lisinopril) .... Take 1 tablet by mouth once a day 2)  Lasix 40 Mg Tabs (Furosemide) .... Take 1and 1/2 tablets by mouth once a day 3)  Glipizide 10 Mg Tabs (Glipizide) .... Take 1 tablet by mouth two times a day 4)  Aspirin 81 Mg Tbec (Aspirin) .... Take 1 tablet by mouth once a day 5)  Digoxin 0.25 Mg Tabs (Digoxin) .... Take 1 tablet by mouth once a day 6)  Actos 45 Mg Tabs (Pioglitazone hcl) .... Take 1 tablet by mouth once a day 7)  Coreg 25 Mg Tabs (Carvedilol) .... Take 1 tablet by mouth two times a day 8)  Metformin Hcl 1000 Mg Tabs (Metformin hcl) .... Take 1 tablet by mouth two times a day 9)  Nitroglycerin 0.4 Mg Subl (Nitroglycerin) .... Place 1 tablet under tongue as needed for chest pain 10)  Norvasc 10 Mg Tabs (Amlodipine besylate) .... Take 1 tablet by mouth once a day 11)  Gabapentin 300 Mg Caps (Gabapentin) .... Take 1 capsule by mouth once a day 12)  Pravastatin Sodium 40 Mg Tabs (Pravastatin sodium) .... Take 1 tablet by mouth once a day 13)  Clonidine Hcl 0.1 Mg Tabs (Clonidine hcl) .... Take 3 tablets by mouth two times a day 14)  Freestyle Test Strp (Glucose blood) .... To test 1- 2x daily 15)  Lancets Misc (Lancets) .... Use to test blood sugar 1-2s daily 16)  Multivitamins Tabs (Multiple vitamin) .[name].. 1 tab by mouth once daily  Other Orders: DSMT(Medicare) Individual, 30 Minutes (G0108)  Diabetes Self Management Training  PCP: Bertha Stakes MD Date diagnosed with diabetes: 08/15/1996 Diabetes Type: Type 2 non-insulin Current smoking Status: never  Assessment Work Hours: Part Time Daily activities: school and work Sources of Support: wife sounds supportive- they had lost weight together eating 6 times a day and measuring foods Special needs or Barriers: was going to school in morning which kept him form wlkaing eveyr day, then went to work  until 9 pm after that- so didn;t eta much until late at night  Potential Barriers  Economic/Supplies  Coping with Diabetes Feelings about Diabetes: Acceptance  Diabetes Medications:  Comments: resistant to insulin, discussed today, he wants ot try something for weight loss. he undertsnds about fluid gain with actos and lack of evidence for byetta nd incretin mimetics. does not have a meter- says he has been waiting for our office to compelte the form form [facility] [employer] for shoes and meter, strips etc.  Discuss risks to haing A1C at 7.7%, encouraged diet nad lifestyle- eating 3-6 times a day. feels patient may benefit form having wife attend visit with him.     Monitoring Self monitoring blood glucose not recently- has no meter     Estimated /Usual Carb Intake Breakfast # of Carbs/Grams oatmeal or skips Bedtime # of Carbs/Grams eats heay meal here often  Nutrition assessment Weight change: Gain  Activity Limitations  Inadequate physical activity Diabetes Disease Process  Discussed today Define diabetes in simple terms: Needs review/assistanceState own type of diabetes: Needs review/assistanceState diabetes is treated by meal plan-exercise-medication-monitoring-education: Needs review/assistance Medications State name-action-dose-duration-side effects-and time to take medication: Needs review/assistance   State appropriate timing of food related to medication: No knowledge   Demonstrates/verbalizes site selection and rotation for injections No knowledge   Correctly draw up and administer insulin-Byetta-Symlin-glucagon: No knowledge   Describe safe  needle/lancet disposal: Needs review/assistance Nutritional Management Verbalize importance of controlling food portions: Needs review/assistance   State importance of spacing and not omitting meals and snacks: Needs review/assistance   State changes planned for home meals/snacks: Needs review/assistance    Monitoring State  purpose and frequency of monitoring BG-ketones-HgbA1C  : Needs review/assistanceState target blood glucose and HgbA1C goals: Needs review/assistance    Complications State the causes-signs and symptoms and prevention of Hyperglycemia: Needs review/assistance   Explain proper treatment of hyperglycemia: Needs review/assistance   Glucose control and the development/prevention of long-term complications: Needs review/assistance   State benefits-risks-and options for improving blood sugar control: Needs review/assistance   B/P and lipid control in the prevention/control of cardiovascular disease: Needs review/assistance    Exercise  Lifestyle changes:Goal setting and Problem solving State benefits of making appropriate lifestyle changes: Needs review/assistanceIdentify lifestyle behaviors that need to change: Needs review/assistanceIdentify risk factors that interfere with health: Needs review/assistanceDevelop strategies to reduce risk factors: Needs review/assistanceIdentify Family/SO role in managing diabetes: Demonstrates competency Psychosocial Adjustment Name two ways of obtaining support from family/friends: Demonstrates competencyDiabetes Management Education Done: 12/22/2009        patietn gave himself an dinjection of 5 units normal salie using an 46mm 30 cc insulin syringe. also tested blood sugar nad was given a Prodigy Pocket meter with the understanding that he will obtain strips form [employer] [employer] and will not use alternatie testing when having hypoglycemia symptoms. Hypoglycemia prevention, symptoms nad treatment were gone over today.   Diabetes Self Management Support:wife and clinic staff Follow-up:

## 2010-09-14 NOTE — Progress Notes (Signed)
Summary: diabetes /dmr  Phone Note Outgoing Call   Call placed by: Barnabas Harries RD,CDE,  February 16, 2010 3:06 PM Summary of Call: Discussed patients diabetes with Dr. Marinda Elk: CDE to request patient keep log of testing for 3 days using finger tip testing/proper technique- repeat any readings of "Low", and bring readings to office for consideration of medication change. Left message for patient to return call.  Follow-up for Phone Call        tried calling patient again today- left another message requesting return call Follow-up by: Barnabas Harries RD,CDE,  February 17, 2010 4:11 PM  Additional Follow-up for Phone Call Additional follow up Details #1::        spoke with patient about above plan- he got another "low" reading this week again and retested and it was 135. I asked him to run a control on his strips and if not in range call Prodigy or whoever he gots his strips from as he could have a bad lot of strips. He said he understood plan to test 3 days in a row and meet with CDE prior to medication change- and he elects to call when ready . Additional Follow-up by: Barnabas Harries RD,CDE,  February 22, 2010 10:10 AM    Additional Follow-up for Phone Call Additional follow up Details #2::    patient called and requested appointment with CDE in am- he thinks he has resolved the meter reading low by being sure he gets enough blood. almost out of [place]- he would like to increase to 10 micrograms twice a day if possible and have other diabetes medications adjusted as planned.   Appointment scheduled for 10 am on 02/24/10 Follow-up by: Barnabas Harries RD,CDE,  February 23, 2010 10:13 AM

## 2010-09-14 NOTE — Letter (Signed)
Summary: Appointment - Reminder [facility], [place]  [address] [place]   [place], [place] [address]   Phone: [phone]  Fax: [phone]     September 09, 2009 MRN: [name]   Charles Hart [address] [place], [place]  [address]   Dear [name],  [facility] records indicate that it is time to schedule a follow-up appointment with Dr. [name]. It is very important that we reach you to schedule this appointment. We look forward to participating in your health care needs. Please contact us at the number listed above at your earliest convenience to schedule your appointment.  If you are unable to make an appointment at this time, give [name] a call so we can update our records.  Sincerely,   [facility] Scheduling Team

## 2010-09-14 NOTE — Progress Notes (Signed)
Summary: diabetes support/dmr  Phone Note Call from Patient Call back at Home Phone [phone]   Caller: Patient Summary of Call: Patient called to ask when he should be testing his blood sugar before or after meals. Responded that he could do both for one meal a day and another before the Byetta or all 3 before meals. [facility] [phone], out of strips- wating for them to be delivered. He feels that Byetta is working well. I will leave 10 strip up front for patient to get him through the weekend. Initial call taken by: Barnabas Harries RD,CDE,  Jan 01, 2010 9:49 AM

## 2010-09-14 NOTE — Assessment & Plan Note (Signed)
Summary: EST-CK/FU/MEDS/CFB   Vital Signs:  Patient profile:   55 year old male Height:      71 inches Weight:      256.4 pounds BMI:     35.89 Temp:     97.3 degrees F oral Pulse rate:   59 / minute BP sitting:   134 / 82  (right arm)  Vitals Entered By: Silverio Decamp NT II (November 11, 2009 10:13 AM) CC: NEED HANDICAPPED FORM FILLED OUT/  Is Patient Diabetic? Yes Did you bring your meter with you today? No Pain Assessment Patient in pain? no      Nutritional Status BMI of > 30 = obese CBG Result 171  Does patient need assistance? Functional Status Self care Ambulation Normal    Primary Care Provider:  Bertha Stakes MD  CC:  NEED HANDICAPPED FORM FILLED OUT/ .  History of Present Illness: Patient returns for follow-up of his diabetes mellitus, hypertension, cardiomyopathy, and other chronic medical problems.  He reports some swelling of his feet which occurs when he has been up on his feet; the swelling resolves when he elevates his feet. He did not bring his blood glucose meter to clinic today. He brought a list of his medications.  Overall, he seems to be doing well.  He reports that he is compliant with his medications.    Preventive Screening-Counseling & Management  Alcohol-Tobacco     Alcohol drinks/day: 0     Smoking Status: never  Current Medications (verified): 1)  Lisinopril 40 Mg Tabs (Lisinopril) .... Take 1 Tablet By Mouth Once A Day 2)  Lasix 40 Mg Tabs (Furosemide) .... Take 1 Tablet By Mouth Once A Day 3)  Glipizide 10 Mg Tabs (Glipizide) .... Take 1 Tablet By Mouth Two Times A Day 4)  Aspirin 81 Mg Tbec (Aspirin) .... Take 1 Tablet By Mouth Once A Day 5)  Digoxin 0.25 Mg Tabs (Digoxin) .... Take 1 Tablet By Mouth Once A Day 6)  Actos 45 Mg Tabs (Pioglitazone Hcl) .... Take 1 Tablet By Mouth Once A Day 7)  Coreg 25 Mg Tabs (Carvedilol) .... Take 1 Tablet By Mouth Two Times A Day 8)  Metformin Hcl 1000 Mg Tabs (Metformin Hcl) .... Take 1 Tablet By Mouth  Two Times A Day 9)  Nitroglycerin 0.4 Mg Subl (Nitroglycerin) .... Place 1 Tablet Under Tongue As Needed For Chest Pain 10)  Norvasc 10 Mg Tabs (Amlodipine Besylate) .... Take 1 Tablet By Mouth Once A Day 11)  Gabapentin 300 Mg Caps (Gabapentin) .... Take 1 Capsule By Mouth Once A Day 12)  Pravastatin Sodium 40 Mg  Tabs (Pravastatin Sodium) .... Take 1 Tablet By Mouth Once A Day 13)  Clonidine Hcl 0.1 Mg  Tabs (Clonidine Hcl) .... Take 3 Tablets By Mouth Two Times A Day 14)  Freestyle Test   Strp (Glucose Blood) .... To Test 1- 2x Daily 15)  Lancets   Misc (Lancets) .... Use To Test Blood Sugar 1-2s Daily 16)  Multivitamins   Tabs (Multiple Vitamin) .[name].. 1 Tab By Mouth Once Daily  Allergies (verified): No Known Drug Allergies  Review of Systems General:  Denies chills and fever. CV:  Complains of swelling of feet; denies chest pain or discomfort, difficulty breathing at night, and difficulty breathing while lying down. Resp:  Complains of cough; denies shortness of breath. GI:  Denies abdominal pain, nausea, and vomiting.  Physical Exam  General:  alert, no distress Lungs:  normal respiratory effort, normal breath sounds,  no crackles, and no wheezes.   Heart:  normal rate, regular rhythm, no murmur, no gallop, and no rub.   Abdomen:  soft, non-tender, normal bowel sounds, no hepatomegaly, and no splenomegaly.   Extremities:  1+ left pedal edema and 1+ right pedal edema.    Diabetes Management Exam:    Foot Exam (with socks and/or shoes not present):       Sensory-Monofilament:          Left foot: normal          Right foot: normal   Impression & Recommendations:  Problem # 1:  DIABETES MELLITUS, TYPE II (ICD-250.00) Patient's A1c is above target range at 7.7.  I initially advised starting Januvia, but he was reluctant to use that medication after we discussed possible (although uncommon) side effects.  I advised him that the best option is to start a low dose of Lantus insulin.  The plan is to refer to Barnabas Harries for diabetes self-management training, and then consider starting a low dose of Lantus in combination with his oral regimen.  His pedal edema is likely aggravated by the Actos, and it would be good to taper or stop Actos if we can get him onto an insulin regimen.  The following medications were removed from the medication list:    Januvia 50 Mg Tabs (Sitagliptin phosphate) .[name]... Take 1 tablet by mouth once a day His updated medication list for this problem includes:    Lisinopril 40 Mg Tabs (Lisinopril) .[name]... Take 1 tablet by mouth once a day    Glipizide 10 Mg Tabs (Glipizide) .[name]... Take 1 tablet by mouth two times a day    Aspirin 81 Mg Tbec (Aspirin) .[name]... Take 1 tablet by mouth once a day    Actos 45 Mg Tabs (Pioglitazone hcl) .[name]... Take 1 tablet by mouth once a day    Metformin Hcl 1000 Mg Tabs (Metformin hcl) .[name]... Take 1 tablet by mouth two times a day  Orders: T-Hgb A1C (in-house) [id number]) T-Comprehensive Metabolic Panel ([id number]) T- Capillary Blood Glucose [id number])  Labs Reviewed: Creat: 0.86 (03/26/2009)     Last Eye Exam: No background diabetic retinopathy.   Visual acuity OD (uncorrected):  20/20 Visual acuity OS (uncorrected):  20/20  Intraocular pressure OD: 17     Intraocular pressure OS:  17  Exam by Herbie Baltimore L. Katy Fitch, MD  (07/29/2008) Reviewed HgBA1c results: 7.7 (11/11/2009)  7.4 (03/26/2009)  Problem # 2:  HYPERTENSION (ICD-401.9) Patient's systolic pressure is slightly above target range. Given his pedal edema, the plan is to increase Lasix to 60 mg daily until his edema resolves. I advised him that he could then reduce the dose to 40 mg daily and monitor for any recurrence of the edema.  His updated medication list for this problem includes:    Lisinopril 40 Mg Tabs (Lisinopril) .[name]... Take 1 tablet by mouth once a day    Lasix 40 Mg Tabs (Furosemide) .[name]... Take 1and 1/2 tablets by mouth once a day    Coreg 25 Mg Tabs  (Carvedilol) .[name]... Take 1 tablet by mouth two times a day    Norvasc 10 Mg Tabs (Amlodipine besylate) .[name]... Take 1 tablet by mouth once a day    Clonidine Hcl 0.1 Mg Tabs (Clonidine hcl) .[name]... Take 3 tablets by mouth two times a day  BP today: 134/82 Prior BP: 149/82 (11/03/2009)  Labs Reviewed: K+: 4.1 (03/26/2009) Creat: : 0.86 (03/26/2009)   Chol: 153 (03/26/2009)   HDL: 41 (  03/26/2009)   LDL: 70 (03/26/2009)   TG: 209 (03/26/2009)  Problem # 3:  HYPERLIPIDEMIA (ICD-272.4) Patient is doing well on current dose of pravastatin. The plan is to check a fasting lipid panel in 2-3 months.  His updated medication list for this problem includes:    Pravastatin Sodium 40 Mg Tabs (Pravastatin sodium) .[name]... Take 1 tablet by mouth once a day  Labs Reviewed: SGOT: 17 (03/26/2009)   SGPT: 21 (03/26/2009)   HDL:41 (03/26/2009), 42 (07/18/2008)  LDL:70 (03/26/2009), 86 (07/18/2008)  Chol:153 (03/26/2009), 177 (07/18/2008)  Trig:209 (03/26/2009), 243 (07/18/2008)  Complete Medication List: 1)  Lisinopril 40 Mg Tabs (Lisinopril) .... Take 1 tablet by mouth once a day 2)  Lasix 40 Mg Tabs (Furosemide) .... Take 1and 1/2 tablets by mouth once a day 3)  Glipizide 10 Mg Tabs (Glipizide) .... Take 1 tablet by mouth two times a day 4)  Aspirin 81 Mg Tbec (Aspirin) .... Take 1 tablet by mouth once a day 5)  Digoxin 0.25 Mg Tabs (Digoxin) .... Take 1 tablet by mouth once a day 6)  Actos 45 Mg Tabs (Pioglitazone hcl) .... Take 1 tablet by mouth once a day 7)  Coreg 25 Mg Tabs (Carvedilol) .... Take 1 tablet by mouth two times a day 8)  Metformin Hcl 1000 Mg Tabs (Metformin hcl) .... Take 1 tablet by mouth two times a day 9)  Nitroglycerin 0.4 Mg Subl (Nitroglycerin) .... Place 1 tablet under tongue as needed for chest pain 10)  Norvasc 10 Mg Tabs (Amlodipine besylate) .... Take 1 tablet by mouth once a day 11)  Gabapentin 300 Mg Caps (Gabapentin) .... Take 1 capsule by mouth once a day 12)  Pravastatin  Sodium 40 Mg Tabs (Pravastatin sodium) .... Take 1 tablet by mouth once a day 13)  Clonidine Hcl 0.1 Mg Tabs (Clonidine hcl) .... Take 3 tablets by mouth two times a day 14)  Freestyle Test Strp (Glucose blood) .... To test 1- 2x daily 15)  Lancets Misc (Lancets) .... Use to test blood sugar 1-2s daily 16)  Multivitamins Tabs (Multiple vitamin) .[name].. 1 tab by mouth once daily  Other Orders: T-Hemoccult Card-Multiple (take home) [id number]) T-Digoxin [id number])  Patient Instructions: 1)  Please schedule a follow-up appointment in 6 weeks. 2)  Please make an appointment with Barnabas Harries for diabetes self-management education. 3)  Increase lasix (furosemide) 40 mg to 1 and 1/2 tablets daily; may decrease to 1 tablet daily when swelling in feet resolves. 4)  Weigh daily and call if your weight changes more than 5 pounds from baseline. 5)  Please bring all of your medications to your next visit.  Prescriptions: LASIX 40 MG TABS (FUROSEMIDE) Take 1and 1/2 tablets by mouth once a day  #45 x 3   Entered and Authorized by:   Bertha Stakes MD   Signed by:   Bertha Stakes MD on 11/11/2009   Method used:   Electronically to        [facility] [phone] (retail)       [place], [place]  [address]       Ph: [phone] or [phone]       Fax: [phone]   RxID:   [id number] JANUVIA 50 MG TABS (SITAGLIPTIN PHOSPHATE) Take 1 tablet by mouth once a day  #30 x 3   Entered and Authorized by:   Bertha Stakes MD  Signed by:   Bertha Stakes MD on 11/11/2009   Method used:   Electronically to        [facility] [phone].* (retail)       [address]       [place], [place]  [address]       Ph: [phone] or [phone]       Fax: [phone]   RxID:   [id number]  Contaced pharmacy to cancel rx for Januvia v/o per Dr Marinda Elk.Mateo Flow [facility])  November 11, 2009 12:02 PM  Process Orders Check Orders Results:     [facility]: Check successful Tests Sent for requisitioning (November 13, 2009 5:22 PM):     11/11/2009: [facility] -- T-Comprehensive Metabolic Panel [id number] (signed)     11/11/2009: [facility] -- T-Digoxin [phone] (signed)    Prevention & Chronic Care Immunizations   Influenza vaccine: Not documented   Influenza vaccine deferral: Not available  (03/26/2009)   Influenza vaccine due: 04/15/2010    Tetanus booster: Not documented    Pneumococcal vaccine: Not documented  Colorectal Screening   Hemoccult: Negative  (10/08/2002)   Hemoccult action/deferral: Ordered  (11/11/2009)   Hemoccult due: 10/09/2003    Colonoscopy: Location:  [place] [facility].    (02/06/2008)   Colonoscopy due: 02/2011  Other Screening   PSA: Not documented   PSA action/deferral: Discussed-PSA declined  (03/26/2009)  Reports requested:  Smoking status: never  (11/11/2009)  Diabetes Mellitus   HgbA1C: 7.7  (11/11/2009)   Hemoglobin A1C due: 02/11/2010    Eye exam: No background diabetic retinopathy.   Visual acuity OD (uncorrected):  20/20 Visual acuity OS (uncorrected):  20/20  Intraocular pressure OD: 17     Intraocular pressure OS:  17  Exam by Herbie Baltimore L. Katy Fitch, MD   (07/29/2008)   Last eye exam report requested.   Eye exam due: 07/29/2009    Foot exam: yes  (11/11/2009)   Foot exam action/deferral: Do today   High risk foot: Yes  (11/11/2009)   Foot care education: Done  (11/11/2009)   Foot exam due: 03/26/2010    Urine microalbumin/creatinine ratio: 244.1  (03/26/2009)   Urine microalbumin action/deferral: Ordered   Urine microalbumin/cr due: 03/26/2010    Diabetes flowsheet reviewed?: Yes   Progress toward A1C goal: Deteriorated   Diabetes comments: Saw Dr. Katy Fitch within past few months per patient.  Lipids   Total Cholesterol: 153  (03/26/2009)   Lipid panel action/deferral: Lipid Panel ordered   LDL: 70  (03/26/2009)   LDL Direct:  Not documented   HDL: 41  (03/26/2009)   Triglycerides: 209  (03/26/2009)   Lipid panel due: 03/26/2010    SGOT (AST): 17  (03/26/2009)   BMP action: Ordered   SGPT (ALT): 21  (03/26/2009) CMP ordered    Alkaline phosphatase: 41  (03/26/2009)   Total bilirubin: 0.4  (03/26/2009)   Liver panel due: 05/14/2010    Lipid flowsheet reviewed?: Yes   Progress toward LDL goal: At goal  Hypertension   Last Blood Pressure: 134 / 82  (11/11/2009)   Serum creatinine: 0.86  (03/26/2009)   BMP action: Ordered   Serum potassium 4.1  (03/26/2009) CMP ordered    Basic metabolic panel due: Q000111Q    Hypertension flowsheet reviewed?: Yes   Progress toward BP goal: Improved  Self-Management Support :   Personal Goals (by the next clinic visit) :  Personal A1C goal: 7  (03/26/2009)     Personal blood pressure goal: 130/80  (03/26/2009)     Personal LDL goal: 100  (03/26/2009)    Patient will work on the following items until the next clinic visit to reach self-care goals:     Medications and monitoring: take my medicines every day, check my blood sugar, examine my feet every day  (11/11/2009)     Eating: drink diet soda or water instead of juice or soda, eat more vegetables, use fresh or frozen vegetables, eat foods that are low in salt, eat baked foods instead of fried foods, eat fruit for snacks and desserts, limit or avoid alcohol  (11/11/2009)     Activity: take a 30 minute walk every day, take the stairs instead of the elevator  (11/11/2009)     Home glucose monitoring frequency: 1 time daily  (03/26/2009)    Diabetes self-management support: Written self-care plan  (11/11/2009)   Diabetes care plan printed    Hypertension self-management support: Written self-care plan  (11/11/2009)   Hypertension self-care plan printed.    Lipid self-management support: Written self-care plan  (11/11/2009)   Lipid self-care plan printed.   Nursing Instructions: Diabetic foot exam  today Provide Hemoccult cards with instructions (see order) Request report of last diabetic eye exam    Diabetic Foot Exam Foot Inspection Is there a history of a foot ulcer?              No Is there a foot ulcer now?              No Can the patient see the bottom of their feet?          Yes Are the shoes appropriate in style and fit?          Yes Is there swelling or an abnormal foot shape?          No Are the toenails long?                No Are the toenails thick?                No Are the toenails ingrown?              No Is there heavy callous build-up?              Yes Is there pain in the calf muscle (Intermittent claudication) when walking?    NoIs there a claw toe deformity?              No Is there elevated skin temperature?            No Is there limited ankle dorsiflexion?            No Is there foot or ankle muscle weakness?            No  Diabetic Foot Care Education Patient educated on appropriate care of diabetic feet.  Pulse Check          Right Foot          Left Foot Posterior Tibial:        normal            normal Dorsalis Pedis:        normal            normal Comments: ROUGH SPOT  BOTTOM OF FOOT NOT SORE. Callus on anteromedial aspect of left foot. High Risk Feet? Yes   10-g (  5.07) Semmes-Weinstein Monofilament Test Performed by: Silverio Decamp NT II          Right Foot          Left Foot Site 1         normal         normal Site 2         normal         normal Site 3         normal         normal Site 4         normal         normal Site 5         normal         normal Site 6         normal         normal Site 7         normal         normal Site 8         normal         normal Site 9         normal         normal  Impression      normal         normal  Laboratory Results   Blood Tests   Date/Time Received: November 11, 2009 11:00 AM  Date/Time Reported: Lenoria Farrier  November 11, 2009 11:00 AM   HGBA1C: 7.7%   (Normal Range: Non-Diabetic -  3-6%   Control Diabetic - 6-8%) CBG Random:: 171mg /dL

## 2010-09-14 NOTE — Assessment & Plan Note (Signed)
Summary: 6 month rov/sl      Allergies Added: NKDA  Primary Provider:  Bertha Stakes MD   History of Present Illness: Dynell is seen today for F/u of HTN, non-ischemic DCM, bruit.  He had a carotid duplex today which i reviewed and it was normal.  He has no documented CAD and was cathed in 2008.  His EF was 30-40% back then but subsequently has returned to normal.  He is not having SSCP or SOB.  He has mild LE edema.  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  It probably represents a form of LVH but has not changed over the years.  I made a copy for him to keep.  His is not good with his diet and his DM is poorly controlled with an A1c over 7.  I talked with him at length about risk factor modification.  His wife seems more motivated than him to start exercising and change his diet  Current Problems (verified): 1)  Carotid Bruit, Right  (ICD-785.9) 2)  Cardiomyopathy  (ICD-425.4) 3)  Congestive Heart Failure  (ICD-428.0) 4)  Hyperlipidemia  (ICD-272.4) 5)  Hypertension  (ICD-401.9) 6)  Hearing Loss, Right Ear  (ICD-389.9) 7)  Diabetes Mellitus, Type II  (ICD-250.00) 8)  Diabetic Peripheral Neuropathy  (ICD-250.60) 9)  Coronary Atherosclerosis, Hx of  (ICD-V12.50)  Current Medications (verified): 1)  Lisinopril 40 Mg Tabs (Lisinopril) .... Take 1 Tablet By Mouth Once A Day 2)  Lasix 40 Mg Tabs (Furosemide) .... Take 1 Tablet By Mouth Once A Day 3)  Glipizide 10 Mg Tabs (Glipizide) .... Take 1 Tablet By Mouth Two Times A Day 4)  Aspirin 81 Mg Tbec (Aspirin) .... Take 1 Tablet By Mouth Once A Day 5)  Digoxin 0.25 Mg Tabs (Digoxin) .... Take 1 Tablet By Mouth Once A Day 6)  Actos 45 Mg Tabs (Pioglitazone Hcl) .... Take 1 Tablet By Mouth Once A Day 7)  Coreg 25 Mg Tabs (Carvedilol) .... Take 1 Tablet By Mouth Two Times A Day 8)  Metformin Hcl 1000 Mg Tabs (Metformin Hcl) .... Take 1 Tablet By Mouth Two Times A Day 9)  Nitroglycerin 0.4 Mg Subl (Nitroglycerin) .... Place 1  Tablet Under Tongue As Needed For Chest Pain 10)  Norvasc 10 Mg Tabs (Amlodipine Besylate) .... Take 1 Tablet By Mouth Once A Day 11)  Gabapentin 300 Mg Caps (Gabapentin) .... Take 1 Capsule By Mouth Once A Day 12)  Pravastatin Sodium 40 Mg  Tabs (Pravastatin Sodium) .... Take 1 Tablet By Mouth Once A Day 13)  Clonidine Hcl 0.1 Mg  Tabs (Clonidine Hcl) .... Take 3 Tablets By Mouth Two Times A Day 14)  Freestyle Test   Strp (Glucose Blood) .... To Test 1- 2x Daily 15)  Lancets   Misc (Lancets) .... Use To Test Blood Sugar 1-2s Daily 16)  Multivitamins   Tabs (Multiple Vitamin) .[name].. 1 Tab By Mouth Once Daily  Allergies (verified): No Known Drug Allergies  Past History:  Past Medical History: Last updated: 03/25/2009 Congestive heart failure with LV function improved from 2004 to 2008 Historically, moderately dilated LV with EF 30-40% by 2D echo 08/14/2002. Mild CAD with severe LV dysfunction by cardiac cath 08/16/2002.   Normal coronary arteries and normal LV function by cardiac cath 09/19/2006.   Current Problems:  CARDIOMYOPATHY (ICD-425.4)-  nonischemic CONGESTIVE HEART FAILURE (ICD-428.0) HYPERLIPIDEMIA (ICD-272.4) HYPERTENSION (ICD-401.9) HEARING LOSS, RIGHT EAR (ICD-389.9) DIABETES MELLITUS, TYPE II (ICD-250.00)  DIABETIC PERIPHERAL NEUROPATHY (ICD-250.60)  CORONARY ATHEROSCLEROSIS, HX OF (ICD-V12.50)              Past Surgical History: Last updated: 03/25/2009 None  Family History: Last updated: 03/26/2009 Patient reports no family history of prostate cancer in a first-degree relative.   Sister had colon cancer. No FH early MI. Sister with diabetes. No FH stroke.  Social History: Last updated: 03/26/2009 No alcohol since 1986.  No history of DUI. Drug use-no Married, has children  Review of Systems       Denies fever, malais, weight loss, blurry vision, decreased visual acuity, cough, sputum, SOB, hemoptysis, pleuritic pain, palpitaitons, heartburn, abdominal  pain, melena, lower extremity edema, claudication, or rash.   Vital Signs:  Patient profile:   55 year old male Height:      71 inches Weight:      255 pounds BMI:     35.69 Pulse rate:   74 / minute Resp:     12 per minute BP sitting:   149 / 82  (left arm)  Vitals Entered By: Burnett Kanaris (November 03, 2009 3:30 PM)  Physical Exam  General:  Affect appropriate Healthy:  appears stated age 55: normal Neck supple with no adenopathy JVP normal no bruits no thyromegaly Lungs clear with no wheezing and good diaphragmatic motion Heart:  S1/S2 no murmur,rub, gallop or click PMI normal Abdomen: benighn, BS positve, no tenderness, no AAA no bruit.  No HSM or HJR Distal pulses intact with no bruits No edema Neuro non-focal Skin warm and dry    Impression & Recommendations:  Problem # 1:  CAROTID BRUIT, RIGHT (ICD-785.9) No significant stenosis by duplex Continue ASA  Problem # 2:  CARDIOMYOPATHY (ICD-425.4) EF improved to normal on last echo.  F.U echo in a year His updated medication list for this problem includes:    Lisinopril 40 Mg Tabs (Lisinopril) .[name]... Take 1 tablet by mouth once a day    Lasix 40 Mg Tabs (Furosemide) .[name]... Take 1 tablet by mouth once a day    Aspirin 81 Mg Tbec (Aspirin) .[name]... Take 1 tablet by mouth once a day    Digoxin 0.25 Mg Tabs (Digoxin) .[name]... Take 1 tablet by mouth once a day    Coreg 25 Mg Tabs (Carvedilol) .[name]... Take 1 tablet by mouth two times a day    Nitroglycerin 0.4 Mg Subl (Nitroglycerin) .[name]... Place 1 tablet under tongue as needed for chest pain    Norvasc 10 Mg Tabs (Amlodipine besylate) .[name]... Take 1 tablet by mouth once a day  Problem # 3:  HYPERLIPIDEMIA (ICD-272.4) At target with no side effects His updated medication list for this problem includes:    Pravastatin Sodium 40 Mg Tabs (Pravastatin sodium) .[name]... Take 1 tablet by mouth once a day  CHOL: 153 (03/26/2009)   LDL: 70 (03/26/2009)   HDL: 41 (03/26/2009)   TG: 209  (03/26/2009)  Problem # 4:  HYPERTENSION (ICD-401.9) Well controlled His updated medication list for this problem includes:    Lisinopril 40 Mg Tabs (Lisinopril) .[name]... Take 1 tablet by mouth once a day    Lasix 40 Mg Tabs (Furosemide) .[name]... Take 1 tablet by mouth once a day    Aspirin 81 Mg Tbec (Aspirin) .[name]... Take 1 tablet by mouth once a day    Coreg 25 Mg Tabs (Carvedilol) .[name]... Take 1 tablet by mouth two times a day    Norvasc 10 Mg Tabs (Amlodipine besylate) .[name]... Take 1 tablet by mouth once a day  Clonidine Hcl 0.1 Mg Tabs (Clonidine hcl) .[name]... Take 3 tablets by mouth two times a day  Patient Instructions: 1)  Your physician recommends that you schedule a follow-up appointment in: ONE YEAR 2)  Your physician has requested that you have an echocardiogram.  Echocardiography is a painless test that uses sound waves to create images of your heart. It provides your doctor with information about the size and shape of your heart and how well your heart's chambers and valves are working.  This procedure takes approximately one hour. There are no restrictions for this procedure.SCHEDULE IN ONE YEAR WHEN SEEING DR [name]

## 2010-09-14 NOTE — Progress Notes (Signed)
Summary: patient coming in today at 2;00  Phone Note Call from Patient   Caller: Patient Call For: Bertha Stakes MD Reason for Call: Talk to Doctor Details for Reason: LETTER TO TAKE CARE OF NEPHEW Summary of Call: Mr.Borrero called this afternoon concerning a formthat has been mailed to him from the court system in [place].The form has to do with his health, wheather he is able to take care of this child  age 54 and his nephew. The patient is going to bring the form by on Monday and he has to have it back and ready to mail it by Wednesday.I told Mr.Roger I would send his doctor a phone note. Initial call taken by: Silverio Decamp NT II,  May 14, 2010 4:20 PM  Follow-up for Phone Call        I received the form today.  I will need to see and examine the patient, and he will need a TB skin test placed and read before I can complete the form.  I can see him in clinic tomorrow afternoon around 2:00 PM if he is able to come in. Follow-up by: Bertha Stakes MD,  May 18, 2010 6:25 PM  Additional Follow-up for Phone Call Additional follow up Details #1::        Appt Scheduled Today at 2:00 patient is aware of this. Additional Follow-up by: [facility] NT II,  May 19, 2010 10:11 AM

## 2010-09-14 NOTE — Progress Notes (Signed)
Summary: hypoglycemia/dmr  Phone Note Call from Patient Call back at Home Phone [phone]   Caller: Patient Complaint: Abdominal Pain Summary of Call: had a few questions- wednesday sugar dropped- felt good all day long, did a double work out, took injection and ate- shrimp fried rice, felt like he was getting sick- sugar was 59, ate a couple mints- felt better, CBG was 92 yesterday morming and this am- 201,  ate ham sandwich with tomato for breakfast, worked out, tightness around his neck, didn't feel good, so stopped and went home- checked sugar 3 hours later 72, is this a low blood sugar? Patient confirmed that he has stopped the Actos.   Discussed with patient that this is likely low blood sugars caused by increased Byetta dose na increased activity. advised him to hold glipizide until further notice and to test CBG prior to activity and have a snack if < 100 of 15 gram carb for every 30 minutes he intends to work out. No snack needed if > 150, between 100 and 150- snack if activity > 30 minutes.Patient repeated back instructions.     wil  route to attending since Dr. Marinda Elk out of office to see if glipizide should be discontinued Initial call taken by: Barnabas Harries RD,CDE,  March 05, 2010 1:27 PM  Follow-up for Phone Call        ok to stop glipizide. Follow-up by: Rhea Pink  DO,  March 05, 2010 2:47 PM     Appended Document: hypoglycemia/dmr    Phone Note Outgoing Call   Call placed by: Barnabas Harries RD,CDE,  March 05, 2010 4:04 PM Summary of Call: spoke wiht patient  to let him know to stop glipizide and call if CBgs become uncontrolled. He verbalized understadnings.

## 2010-09-14 NOTE — Progress Notes (Signed)
Summary: strength training at the [facility]  Phone Note Other Incoming   Caller: Charles MASSEY Request: Send information Details for Reason: consent for patient to do exercise program Summary of Call: Charles Hart from [facility] would like to have formed filled out for Charles Hart to participate in strength training.The phone number is [phone]. I will put form in your box. Initial call taken by: Silverio Decamp NT II,  March 01, 2010 11:42 AM  Follow-up for Phone Call        I forwarded clearance form to patient's cardiologist in July. Follow-up by: Bertha Stakes MD,  March 31, 2010 6:28 PM

## 2010-09-14 NOTE — Letter (Signed)
Summary: BLOOD GLUCOSE REPORT 01-30-28-2011  BLOOD GLUCOSE REPORT 01-30-28-2011   Imported By: Garlan Fillers 02/18/2010 10:45:09  _____________________________________________________________________  External Attachment:    Type:   Image     Comment:   External Document

## 2010-09-14 NOTE — Assessment & Plan Note (Signed)
Summary: TO SEE Breezie Micucci @2PM /CH   Vital Signs:  Patient profile:   55 year old male Height:      71 inches Weight:      241.1 pounds BMI:     33.75 Temp:     98.0 degrees F oral Pulse rate:   86 / minute BP sitting:   155 / 82  (right arm)  Vitals Entered By: Silverio Decamp NT II (May 19, 2010 2:04 PM)  Serial Vital Signs/Assessments:  Time      Position  BP       Pulse  Resp  Temp     By                     144/72                         Mamie Hague NT II  CC: FORM TO BE FILLED OUT Is Patient Diabetic? Yes Did you bring your meter with you today? No Pain Assessment Patient in pain? no      Nutritional Status BMI of > 30 = obese CBG Result 218  Have you ever been in a relationship where you felt threatened, hurt or afraid?No   Does patient need assistance? Functional Status Self care Ambulation Normal   Primary Care Provider:  Bertha Stakes MD  CC:  FORM TO BE FILLED OUT.  History of Present Illness: Patient returns for follow-up of his diabetes mellitus, hypertension, hyperlipidemia,  and other chronic medical problems.  He also requests that I complete a medical evaluation form for the [facility] DSS so that he can apply to serve as a foster parent for his nephew. He has no complaints today. The area of rash on his right side improved with use of the topical steroid cream. He reports that is doing well on Byetta, with morning blood sugars usually in the 120s or 130s. He denies any problems with hypoglycemia. Patient has now stopped glipizide, metformin, and Actos; his only medication for diabetes is Byetta.  Patient did not bring his blood glucose meter or his medications with him to clinic today.   Preventive Screening-Counseling & Management  Alcohol-Tobacco     Alcohol drinks/day: 0     Smoking Status: never  Medications Prior to Update: 1)  Lisinopril 40 Mg Tabs (Lisinopril) .... Take 1 Tablet By Mouth Once A Day 2)  Lasix 40 Mg Tabs (Furosemide) .... Take  1and 1/2 Tablets By Mouth Once A Day 3)  Aspirin 81 Mg Tbec (Aspirin) .... Take 1 Tablet By Mouth Once A Day 4)  Digoxin 0.25 Mg Tabs (Digoxin) .... Take 1 Tablet By Mouth Once A Day 5)  Coreg 25 Mg Tabs (Carvedilol) .... Take 1 Tablet By Mouth Two Times A Day 6)  Metformin Hcl 1000 Mg Tabs (Metformin Hcl) .... Take 1 Tablet By Mouth Two Times A Day 7)  Nitroglycerin 0.4 Mg Subl (Nitroglycerin) .... Place 1 Tablet Under Tongue As Needed For Chest Pain 8)  Norvasc 10 Mg Tabs (Amlodipine Besylate) .... Take 1 Tablet By Mouth Once A Day 9)  Gabapentin 300 Mg Caps (Gabapentin) .... Take 1 Capsule By Mouth Once A Day 10)  Pravastatin Sodium 40 Mg  Tabs (Pravastatin Sodium) .... Take 1 Tablet By Mouth Once A Day 11)  Clonidine Hcl 0.1 Mg  Tabs (Clonidine Hcl) .... Take 3 Tablets By Mouth Two Times A Day 12)  Freestyle Test   Strp (Glucose Blood) .Marland KitchenMarland KitchenMarland Kitchen  To Test 1- 2x Daily 13)  Lancets   Misc (Lancets) .... Use To Test Blood Sugar Three Times A Day 14)  Multivitamins   Tabs (Multiple Vitamin) .[name].. 1 Tab By Mouth Once Daily 15)  Byetta 10 Mcg Pen 10 Mcg/0.72ml Soln (Exenatide) .... Inject 10 Micrograms Subcutaneously Two Times A Day Within 60 Minutes Prior To A Meal 16)  Prodigy Mini Pen Needles 31g X 5 Mm Misc (Insulin Pen Needle) .... Use As Directed For Byetta Injections Two Times A Day. Dx:250.00 17)  Triamcinolone Acetonide 0.1 % Crea (Triamcinolone Acetonide) .... Apply Thin Film To Affected Area Two Times A Day  Current Medications (verified): 1)  Lisinopril 40 Mg Tabs (Lisinopril) .... Take 1 Tablet By Mouth Once A Day 2)  Lasix 40 Mg Tabs (Furosemide) .... Take 1and 1/2 Tablets By Mouth Once A Day 3)  Aspirin 81 Mg Tbec (Aspirin) .... Take 1 Tablet By Mouth Once A Day 4)  Digoxin 0.25 Mg Tabs (Digoxin) .... Take 1 Tablet By Mouth Once A Day 5)  Coreg 25 Mg Tabs (Carvedilol) .... Take 1 Tablet By Mouth Two Times A Day 6)  Nitroglycerin 0.4 Mg Subl (Nitroglycerin) .... Place 1 Tablet Under Tongue  As Needed For Chest Pain 7)  Norvasc 10 Mg Tabs (Amlodipine Besylate) .... Take 1 Tablet By Mouth Once A Day 8)  Gabapentin 300 Mg Caps (Gabapentin) .... Take 1 Capsule By Mouth Once A Day 9)  Pravastatin Sodium 40 Mg  Tabs (Pravastatin Sodium) .... Take 1 Tablet By Mouth Once A Day 10)  Clonidine Hcl 0.1 Mg  Tabs (Clonidine Hcl) .... Take 3 Tablets By Mouth Two Times A Day 11)  Freestyle Test   Strp (Glucose Blood) .... To Test 1- 2x Daily 12)  Lancets   Misc (Lancets) .... Use To Test Blood Sugar Three Times A Day 13)  Multivitamins   Tabs (Multiple Vitamin) .[name].. 1 Tab By Mouth Once Daily 14)  Byetta 10 Mcg Pen 10 Mcg/0.19ml Soln (Exenatide) .... Inject 10 Micrograms Subcutaneously Two Times A Day Within 60 Minutes Prior To A Meal 15)  Prodigy Mini Pen Needles 31g X 5 Mm Misc (Insulin Pen Needle) .... Use As Directed For Byetta Injections Two Times A Day. Dx:250.00  Allergies (verified): No Known Drug Allergies  Family History: Reviewed history from 03/26/2009 and no changes required. Patient reports no family history of prostate cancer in a first-degree relative.   Sister had colon cancer. No FH early MI. Sister with diabetes. No FH stroke.  Social History: Reviewed history from 03/26/2009 and no changes required. No alcohol since 1986.  No history of DUI. Drug use-no Married, has children  Review of Systems General:  Denies chills, fever, loss of appetite, sleep disorder, and sweats. Eyes:  Denies blurring and double vision. 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 CV:  Denies chest pain or discomfort, difficulty breathing at night, difficulty breathing while lying down, fainting, fatigue, palpitations, shortness of breath with exertion, swelling of feet, and weight gain. Resp:  Denies chest discomfort, cough, shortness of breath, and wheezing. GI:  Denies abdominal pain, bloody stools, dark tarry stools, diarrhea,  nausea, and vomiting. GU:  Denies dysuria and urinary frequency. MS:  Denies joint pain and muscle aches. Derm:  local area of rash on abdomen improved. Neuro:  Denies difficulty with concentration, disturbances in coordination, falling down, memory loss, numbness, poor balance, seizures, tingling, visual disturbances, and weakness. Psych:  Denies anxiety and  depression. Endo:  Denies excessive hunger, excessive thirst, and excessive urination.  Physical Exam  General:  alert, no distress Mouth:  no gingival abnormalities, pharynx pink and moist, no erythema, no exudates, and no lesions.   Neck:  supple, no masses, and no thyromegaly.   Lungs:  normal respiratory effort, normal breath sounds, no crackles, and no wheezes.   Heart:  normal rate, regular rhythm, no murmur, no gallop, and no rub.   Abdomen:  soft, non-tender, normal bowel sounds, no hepatomegaly, and no splenomegaly.   Extremities:  trace left pedal edema and trace right pedal edema.   Neurologic:  alert & oriented X3, cranial nerves II-XII intact, strength normal in all extremities, and gait normal.   Psych:  normally interactive, good eye contact, not anxious appearing, and not depressed appearing.    Diabetes Management Exam:    Foot Exam (with socks and/or shoes not present):       Sensory-Monofilament:          Left foot: normal          Right foot: normal   Impression & Recommendations:  Problem # 1:  DIABETES MELLITUS, TYPE II (ICD-250.00) Patient's diabetes mellitus is well controlled on current regimen.  Plan is to continue current medications, and continue home capillary blood glucose monitoring.   I advised patient to bring his meter and his medications to his next clinic visit. Will check labs as below.  The following medications were removed from the medication list:    Metformin Hcl 1000 Mg Tabs (Metformin hcl) .[name]... Take 1 tablet by mouth two times a day His updated medication list for this problem  includes:    Lisinopril 40 Mg Tabs (Lisinopril) .[name]... Take 1 tablet by mouth once a day    Aspirin 81 Mg Tbec (Aspirin) .[name]... Take 1 tablet by mouth once a day    Byetta 10 Mcg Pen 10 Mcg/0.74ml Soln (Exenatide) ..... Inject 10 micrograms subcutaneously two times a day within 60 minutes prior to a meal  Orders: T-Hgb A1C (in-house) [id number]) T-Urine Microalbumin w/creat. ratio [id number]) T-Comprehensive Metabolic Panel ([id number])  Labs Reviewed: Creat: 0.95 (12/22/2009)     Last Eye Exam: No background diabetic retinopathy.   Visual acuity OD (uncorrected):  20/20 Visual acuity OS (uncorrected):  20/20  Intraocular pressure OD: 17     Intraocular pressure OS:  17  Exam by Herbie Baltimore L. Katy Fitch, MD  (07/29/2008) Reviewed HgBA1c results: 6.3 (05/19/2010)  7.3 (12/22/2009)  Problem # 2:  HYPERTENSION (ICD-401.9) A repeat blood pressure today was 144/72, slightly above target range but reasonably well controlled. For now, will continue current medication regimen.  His updated medication list for this problem includes:    Lisinopril 40 Mg Tabs (Lisinopril) .[name]... Take 1 tablet by mouth once a day    Lasix 40 Mg Tabs (Furosemide) .[name]... Take 1and 1/2 tablets by mouth once a day    Coreg 25 Mg Tabs (Carvedilol) .[name]... Take 1 tablet by mouth two times a day    Norvasc 10 Mg Tabs (Amlodipine besylate) .[name]... Take 1 tablet by mouth once a day    Clonidine Hcl 0.1 Mg Tabs (Clonidine hcl) .[name]... Take 3 tablets by mouth two times a day  BP today: 155/82 Prior BP: 145/78 (02/10/2010)  Labs Reviewed: K+: 4.1 (12/22/2009) Creat: : 0.95 (12/22/2009)   Chol: 153 (03/26/2009)   HDL: 41 (03/26/2009)   LDL: 70 (03/26/2009)   TG: 209 (03/26/2009)  Problem # 3:  HYPERLIPIDEMIA (ICD-272.4) Patient is  doing well on pravastatin with no apparent side effects. Will check a lipid panel.  His updated medication list for this problem includes:    Pravastatin Sodium 40 Mg Tabs (Pravastatin sodium) .[name]...  Take 1 tablet by mouth once a day  Orders: T-Lipid Profile [id number])  Labs Reviewed: SGOT: 18 (11/11/2009)   SGPT: 19 (11/11/2009)   HDL:41 (03/26/2009), 42 (07/18/2008)  LDL:70 (03/26/2009), 86 (07/18/2008)  Chol:153 (03/26/2009), 177 (07/18/2008)  Trig:209 (03/26/2009), 243 (07/18/2008)  Problem # 4:  DIABETIC PERIPHERAL NEUROPATHY (ICD-250.60) Patient is doing well on current dose of gabapentin.  The following medications were removed from the medication list:    Metformin Hcl 1000 Mg Tabs (Metformin hcl) .[name]... Take 1 tablet by mouth two times a day His updated medication list for this problem includes:    Lisinopril 40 Mg Tabs (Lisinopril) .[name]... Take 1 tablet by mouth once a day    Aspirin 81 Mg Tbec (Aspirin) .[name]... Take 1 tablet by mouth once a day    Byetta 10 Mcg Pen 10 Mcg/0.70ml Soln (Exenatide) ..... Inject 10 micrograms subcutaneously two times a day within 60 minutes prior to a meal  Problem # 5:  Hx of CARDIOMYOPATHY (ICD-425.4) Patient's left ventricular systolic function had returned to normal on his most recent echocardiogram in August of 2010.  He is currently asymptomatic. He will see his cardiologist Dr. [name] for routine followup.  Complete Medication List: 1)  Lisinopril 40 Mg Tabs (Lisinopril) .... Take 1 tablet by mouth once a day 2)  Lasix 40 Mg Tabs (Furosemide) .... Take 1and 1/2 tablets by mouth once a day 3)  Aspirin 81 Mg Tbec (Aspirin) .... Take 1 tablet by mouth once a day 4)  Digoxin 0.25 Mg Tabs (Digoxin) .... Take 1 tablet by mouth once a day 5)  Coreg 25 Mg Tabs (Carvedilol) .... Take 1 tablet by mouth two times a day 6)  Nitroglycerin 0.4 Mg Subl (Nitroglycerin) .... Place 1 tablet under tongue as needed for chest pain 7)  Norvasc 10 Mg Tabs (Amlodipine besylate) .... Take 1 tablet by mouth once a day 8)  Gabapentin 300 Mg Caps (Gabapentin) .... Take 1 capsule by mouth once a day 9)  Pravastatin Sodium 40 Mg Tabs (Pravastatin sodium) .... Take 1  tablet by mouth once a day 10)  Clonidine Hcl 0.1 Mg Tabs (Clonidine hcl) .... Take 3 tablets by mouth two times a day 11)  Freestyle Test Strp (Glucose blood) .... To test 1- 2x daily 12)  Lancets Misc (Lancets) .... Use to test blood sugar three times a day 13)  Multivitamins Tabs (Multiple vitamin) .[name].. 1 tab by mouth once daily 14)  Byetta 10 Mcg Pen 10 Mcg/0.4ml Soln (Exenatide) .... Inject 10 micrograms subcutaneously two times a day within 60 minutes prior to a meal 15)  Prodigy Mini Pen Needles 31g X 5 Mm Misc (Insulin pen needle) .... Use as directed for byetta injections two times a day. dx:250.00  Other Orders: T-Digoxin [id number]) TB Skin Test [phone]) Admin 1st Vaccine [phone]) T- Capillary Blood Glucose [id number])  Patient Instructions: 1)  Return on Friday, October 7 to have skin test read. 2)  Please schedule a follow-up appointment in 2 months. 3)  Bring all of your medications and your blood glucose meter to each appointment.  Prevention & Chronic Care Immunizations   Influenza vaccine: Not documented   Influenza vaccine deferral: Not available  (03/26/2009)   Influenza vaccine due: 04/15/2010    Tetanus booster: Not documented  Pneumococcal vaccine: Not documented  Colorectal Screening   Hemoccult: Negative  (10/08/2002)   Hemoccult action/deferral: Ordered  (11/11/2009)   Hemoccult due: 10/09/2003    Colonoscopy: Location:  [place].    (02/06/2008)   Colonoscopy due: 02/2011  Other Screening   PSA: Not documented   PSA action/deferral: Discussed-PSA declined  (03/26/2009)  Reports requested:  Smoking status: never  (05/19/2010)  Diabetes Mellitus   HgbA1C: 6.3  (05/19/2010)   HgbA1C action/deferral: Ordered  (05/19/2010)   Hemoglobin A1C due: 02/11/2010    Eye exam: No background diabetic retinopathy.   Visual acuity OD (uncorrected):  20/20 Visual acuity OS (uncorrected):  20/20  Intraocular pressure OD: 17     Intraocular  pressure OS:  17  Exam by Herbie Baltimore L. Katy Fitch, MD   (07/29/2008)   Last eye exam report requested.   Eye exam due: 07/29/2009    Foot exam: yes  (05/19/2010)   Foot exam action/deferral: Do today   High risk foot: Yes  (05/19/2010)   Foot care education: Done  (05/19/2010)   Foot exam due: 03/26/2010    Urine microalbumin/creatinine ratio: 244.1  (03/26/2009)   Urine microalbumin action/deferral: Ordered   Urine microalbumin/cr due: 03/26/2010    Diabetes flowsheet reviewed?: Yes   Progress toward A1C goal: At goal  Lipids   Total Cholesterol: 153  (03/26/2009)   Lipid panel action/deferral: Lipid Panel ordered   LDL: 70  (03/26/2009)   LDL Direct: Not documented   HDL: 41  (03/26/2009)   Triglycerides: 209  (03/26/2009)   Lipid panel due: 03/26/2010    SGOT (AST): 18  (11/11/2009)   BMP action: Ordered   SGPT (ALT): 19  (11/11/2009) CMP ordered    Alkaline phosphatase: 40  (11/11/2009)   Total bilirubin: 0.5  (11/11/2009)   Liver panel due: 05/14/2010    Lipid flowsheet reviewed?: Yes   Progress toward LDL goal: At goal  Hypertension   Last Blood Pressure: 155 / 82  (05/19/2010)   Serum creatinine: 0.95  (12/22/2009)   BMP action: Ordered   Serum potassium 4.1  (12/22/2009) CMP ordered    Basic metabolic panel due: [id number]    Hypertension flowsheet reviewed?: Yes   Progress toward BP goal: Unchanged  Self-Management Support :   Personal Goals (by the next clinic visit) :     Personal A1C goal: 7  (03/26/2009)     Personal blood pressure goal: 130/80  (03/26/2009)     Personal LDL goal: 100  (03/26/2009)    Patient will work on the following items until the next clinic visit to reach self-care goals:     Medications and monitoring: take my medicines every day, check my blood sugar  (05/19/2010)     Eating: drink diet soda or water instead of juice or soda, eat more vegetables, use fresh or frozen vegetables, eat foods that are low in salt, eat baked foods  instead of fried foods, eat fruit for snacks and desserts, limit or avoid alcohol  (05/19/2010)     Activity: take a 30 minute walk every day, park at the far end of the parking lot  (05/19/2010)     Home glucose monitoring frequency: 1 time daily  (03/26/2009)    Diabetes self-management support: Education handout, Resources for patients handout, Written self-care plan  (05/19/2010)   Diabetes care plan printed   Diabetes education handout printed   Last diabetes self-management training by diabetes educator: 02/24/2010    Hypertension self-management support: Water quality scientist, Resources  for patients handout, Written self-care plan  (05/19/2010)   Hypertension self-care plan printed.   Hypertension education handout printed    Lipid self-management support: Education handout, Resources for patients handout, Written self-care plan  (05/19/2010)   Lipid self-care plan printed.   Lipid education handout printed      Resource handout printed.   Nursing Instructions: HgbA1C today (see order) Request report of last diabetic eye exam Diabetic foot exam today    Diabetic Foot Exam Foot Inspection Is there a history of a foot ulcer?              No Is there a foot ulcer now?              No Can the patient see the bottom of their feet?          No Are the shoes appropriate in style and fit?          Yes Is there swelling or an abnormal foot shape?          No Are the toenails long?                No Are the toenails thick?                No Are the toenails ingrown?              No Is there heavy callous build-up?              No Is there pain in the calf muscle (Intermittent claudication) when walking?    NoIs there a claw toe deformity?              No Is there elevated skin temperature?            No Is there limited ankle dorsiflexion?            No Is there foot or ankle muscle weakness?            No  Diabetic Foot Care Education Patient educated on appropriate care of  diabetic feet.  Pulse Check          Right Foot          Left Foot Posterior Tibial:        normal            normal Dorsalis Pedis:        normal            normal Comments: RIGHT FOOT HAS CALLOUS  High Risk Feet? Yes   10-g (5.07) Semmes-Weinstein Monofilament Test Performed by: Silverio Decamp NT II          Right Foot          Left Foot Site 1         normal         normal Site 2         normal         normal Site 3         normal         normal Site 4         normal         normal Site 5         normal         normal Site 6         normal         normal Site 7  normal         normal Site 8         normal         normal Site 9         normal         normal  Impression      normal         normal  Process Orders Check Orders Results:     [facility]: Order checked:     [id number] -- T-Digoxin -- ABN required due to diagnosis (CPT: [phone]) Tests Sent for requisitioning (May 21, 2010 4:19 PM):     05/19/2010: [facility] -- T-Urine Microalbumin w/creat. ratio [[id number]] (signed)     05/19/2010: [facility] -- T-Lipid Profile [id number] (signed)     05/19/2010: [facility] -- T-Comprehensive Metabolic Panel [id number] (signed)     05/19/2010: [facility] -- T-Digoxin [id number] (signed)       Immunizations Administered:  PPD Skin Test:    Vaccine Type: PPD    Site: left forearm    Mfr: Sanofi Pasteur    Dose: 0.1 ml    Route: ID    Given by: Sander Nephew RN    Exp. Date: 02/19/2012    Lot #: [id number]  PPD Results    Date of reading: 05/21/2010    Results: < 9mm    Interpretation: negative  Laboratory Results   Blood Tests   Date/Time Received: May 19, 2010 3:45 PM  Date/Time Reported: Lenoria Farrier  May 19, 2010 3:45 PM   HGBA1C: 6.3%   (Normal Range: Non-Diabetic - 3-6%   Control Diabetic - 6-8%) CBG Random:: 218mg /dL

## 2010-09-14 NOTE — Assessment & Plan Note (Signed)
Summary: diabetes/dmr   Vital Signs:  Patient profile:   55 year old male Weight:      253.1 pounds BMI:     35.43  Allergies: No Known Drug Allergies   Complete Medication List: 1)  Lisinopril 40 Mg Tabs (Lisinopril) .... Take 1 tablet by mouth once a day 2)  Lasix 40 Mg Tabs (Furosemide) .... Take 1and 1/2 tablets by mouth once a day 3)  Glipizide 10 Mg Tabs (Glipizide) .... Take 1/2 tablet by mouth two times a day 4)  Aspirin 81 Mg Tbec (Aspirin) .... Take 1 tablet by mouth once a day 5)  Digoxin 0.25 Mg Tabs (Digoxin) .... Take 1 tablet by mouth once a day 6)  Actos 45 Mg Tabs (Pioglitazone hcl) .... Take 1 tablet by mouth once a day 7)  Coreg 25 Mg Tabs (Carvedilol) .... Take 1 tablet by mouth two times a day 8)  Metformin Hcl 1000 Mg Tabs (Metformin hcl) .... Take 1 tablet by mouth two times a day 9)  Nitroglycerin 0.4 Mg Subl (Nitroglycerin) .... Place 1 tablet under tongue as needed for chest pain 10)  Norvasc 10 Mg Tabs (Amlodipine besylate) .... Take 1 tablet by mouth once a day 11)  Gabapentin 300 Mg Caps (Gabapentin) .... Take 1 capsule by mouth once a day 12)  Pravastatin Sodium 40 Mg Tabs (Pravastatin sodium) .... Take 1 tablet by mouth once a day 13)  Clonidine Hcl 0.1 Mg Tabs (Clonidine hcl) .... Take 3 tablets by mouth two times a day 14)  Freestyle Test Strp (Glucose blood) .... To test 1- 2x daily 15)  Lancets Misc (Lancets) .... Use to test blood sugar three times a day 16)  Multivitamins Tabs (Multiple vitamin) .[name].. 1 tab by mouth once daily 17)  Byetta 5 Mcg Pen 5 Mcg/0.49ml Soln (Exenatide) .... Inject 5 micrograms subcutaneously two times a day within 60 minutes prior to a meal 18)  Prodigy Mini Pen Needles 31g X 5 Mm Misc (Insulin pen needle) .... Use as directed for byetta injections two times a day. dx:250.00 19)  Triamcinolone Acetonide 0.1 % Crea (Triamcinolone acetonide) .... Apply thin film to affected area two times a day  Other  Orders: DSMT(Medicare) Individual, 30 Minutes [id number])  Diabetes Self Management Training  PCP: Bertha Stakes MD Date diagnosed with diabetes: 08/15/1996 Diabetes Type: Type 2 non-insulin Other persons present: no Current smoking Status: never  Vital Signs Todays Weight: 253.1lb  in BMI 35.43in-lbs   Assessment Daily activities: school and work Sources of Support: wife sounds supportive. applying for [employer] to help wiht weight loss  Diabetes Medications:  Herbs or Supplements: No Comments: Brought meter- see download, he has been mostly repeating readings when he gets  "Low" or an unusual reading using finger tips and making sure he gets enough blood. He thinks he was not getting enough blood becaue he dislikes sticking himself/pain. He is going to retry alternative site testing using a different lancing device. Range for past week 93-243. Patient states his goal is to get off medications and decrease weigh to  ~ 200# gradually.       Nutrition assessment Weight change: Loss Amount of change: 2# in past week Desired Weight: 200#  Activity Limitations  Inadequate physical activity  Type of physical activity  patient is taking PE starting in August and wants to join program at [facility] Diabetes Disease Process  Discussed today  Medications State name-action-dose-duration-side effects-and time to take medication: Needs review/assistance    Nutritional  Management Verbalize importance of controlling food portions: Needs review/assistance    Monitoring State purpose and frequency of monitoring BG-ketones-HgbA1C  : Needs review/assistance   Perform glucose monitoring/ketone testing and record results correctly: Needs review/assistance     Complications  Exercise States effect of exercise on blood glucose: Demonstrates competencyDiabetes Management Education Done: 02/24/2010    BEHAVIORAL GOALS INITIAL Monitoring blood glucose levels daily: try using other lancing device  for alternate site testing, test occasionally in evening    BEHAVIORAL GOAL FOLLOW UP Monitoring blood glucose levels daily: Most of the time- missed repreat one time in past week- getting tired of all the testing      Patientready to increase to 10 micrograms Byetta twice daily. Again recommended increased testing in PM  Diabetes Self Management Support:wife and clinic staff Follow-up: 3 months     Appended Document: diabetes/dmr  Impression & Recommendations:  Problem # 1:  DIABETES MELLITUS, TYPE II (ICD-250.00) I reviewed patient's recent home glucose measurements with Barnabas Harries, and based upon our discussion plan is to increase Byetta to 10 micrograms two times a day and stop glipizide.  Hopefully can then try tapering the Actos.  The following medications were removed from the medication list:    Glipizide 10 Mg Tabs (Glipizide) .[name]... Take 1/2 tablet by mouth two times a day His updated medication list for this problem includes:    Lisinopril 40 Mg Tabs (Lisinopril) .[name]... Take 1 tablet by mouth once a day    Aspirin 81 Mg Tbec (Aspirin) .[name]... Take 1 tablet by mouth once a day    Actos 45 Mg Tabs (Pioglitazone hcl) .[name]... Take 1 tablet by mouth once a day    Metformin Hcl 1000 Mg Tabs (Metformin hcl) .[name]... Take 1 tablet by mouth two times a day    Byetta 10 Mcg Pen 10 Mcg/0.11ml Soln (Exenatide) ..... Inject 10 micrograms subcutaneously two times a day within 60 minutes prior to a meal  Labs Reviewed: Creat: 0.95 (12/22/2009)     Last Eye Exam: No background diabetic retinopathy.   Visual acuity OD (uncorrected):  20/20 Visual acuity OS (uncorrected):  20/20  Intraocular pressure OD: 17     Intraocular pressure OS:  17  Exam by Herbie Baltimore L. Katy Fitch, MD  (07/29/2008) Reviewed HgBA1c results: 7.3 (12/22/2009)  7.7 (11/11/2009)  Medications Added to Medication List This Visit: 1)  Byetta 10 Mcg Pen 10 Mcg/0.22ml Soln (Exenatide) .... Inject 10 micrograms subcutaneously two  times a day within 60 minutes prior to a meal  Prescriptions: BYETTA 10 MCG PEN 10 MCG/0.04ML SOLN (EXENATIDE) Inject 10 micrograms subcutaneously two times a day within 60 minutes prior to a meal  #1 pen x 5   Entered and Authorized by:   Bertha Stakes MD   Signed by:   Bertha Stakes MD on 02/24/2010   Method used:   Electronically to        [phone] (retail)       [address]       [place], [place]  [address]       Ph: [phone] or [phone]       Fax: [phone]   [id number]    Appended Document: diabetes/dmr   Impression & Recommendations:  Problem # 1:  DIABETES MELLITUS, TYPE II (ICD-250.00) I discussed further with Barnabas Harries, and given patient's desire to reduce or stop Actos, plan is to continue glipizide as before and  stop Actos.  Patient will need to follow his blood glucose closely and let us know if he has any highs or lows.  The following medications were removed from the medication list:    Actos 45 Mg Tabs (Pioglitazone hcl) .[name]... Take 1 tablet by mouth once a day His updated medication list for this problem includes:    Lisinopril 40 Mg Tabs (Lisinopril) .[name]... Take 1 tablet by mouth once a day    Aspirin 81 Mg Tbec (Aspirin) .[name]... Take 1 tablet by mouth once a day    Metformin Hcl 1000 Mg Tabs (Metformin hcl) .[name]... Take 1 tablet by mouth two times a day    Byetta 10 Mcg Pen 10 Mcg/0.44ml Soln (Exenatide) ..... Inject 10 micrograms subcutaneously two times a day within 60 minutes prior to a meal    Glipizide 10 Mg Tabs (Glipizide) .[name]... Take 1/2 tablet by mouth two times a day  Labs Reviewed: Creat: 0.95 (12/22/2009)     Last Eye Exam: No background diabetic retinopathy.   Visual acuity OD (uncorrected):  20/20 Visual acuity OS (uncorrected):  20/20  Intraocular pressure OD: 17     Intraocular pressure OS:  17  Exam by Herbie Baltimore L. Katy Fitch, MD  (07/29/2008) Reviewed HgBA1c results: 7.3 (12/22/2009)  7.7  (11/11/2009)  Medications Added to Medication List This Visit: 1)  Glipizide 10 Mg Tabs (Glipizide) .... Take 1/2 tablet by mouth two times a day   Appended Document: diabetes/dmr    Phone Note Outgoing Call   Call placed by: Barnabas Harries RD,CDE,  February 26, 2010 2:02 PM Summary of Call: spoke to patient who acknowledged the increase in Byetta and to stop the Actos and continue the glipizide as he was doing.

## 2010-09-14 NOTE — Medication Information (Signed)
Summary: [facility]   Imported By: Garlan Fillers 12/23/2009 13:50:58  _____________________________________________________________________  External Attachment:    Type:   Image     Comment:   External Document

## 2010-09-16 NOTE — Consult Note (Signed)
[facility]   Imported By: Garlan Fillers 08/20/2010 11:11:44  _____________________________________________________________________  External Attachment:    Type:   Image     Comment:   External Document  Appended Document: [facility]    Diabetic Eye Exam  Procedure date:  08/03/2010  Findings:      Minimal diabetic retinopathy OU. Exam by Clent Jacks, MD  Procedures Next Due Date:    Diabetic Eye Exam: 02/2011

## 2010-09-23 ENCOUNTER — Emergency Department (HOSPITAL_COMMUNITY)
Admission: EM | Admit: 2010-09-23 | Discharge: 2010-09-24 | Disposition: A | Discharge status: Home / Self Care | Payer: Medicare Other | Attending: Emergency Medicine | Admitting: Emergency Medicine

## 2010-09-23 DIAGNOSIS — R509 Fever, unspecified: Secondary | ICD-10-CM | POA: Insufficient documentation

## 2010-09-23 DIAGNOSIS — E119 Type 2 diabetes mellitus without complications: Secondary | ICD-10-CM | POA: Insufficient documentation

## 2010-09-23 DIAGNOSIS — R059 Cough, unspecified: Secondary | ICD-10-CM | POA: Insufficient documentation

## 2010-09-23 DIAGNOSIS — I252 Old myocardial infarction: Secondary | ICD-10-CM | POA: Insufficient documentation

## 2010-09-23 DIAGNOSIS — B9789 Other viral agents as the cause of diseases classified elsewhere: Secondary | ICD-10-CM | POA: Insufficient documentation

## 2010-09-23 DIAGNOSIS — IMO0001 Reserved for inherently not codable concepts without codable children: Secondary | ICD-10-CM | POA: Insufficient documentation

## 2010-09-23 DIAGNOSIS — R05 Cough: Secondary | ICD-10-CM | POA: Insufficient documentation

## 2010-09-23 DIAGNOSIS — I10 Essential (primary) hypertension: Secondary | ICD-10-CM | POA: Insufficient documentation

## 2010-09-27 ENCOUNTER — Emergency Department (HOSPITAL_COMMUNITY)
Admission: EM | Admit: 2010-09-27 | Discharge: 2010-09-27 | Disposition: A | Discharge status: Home / Self Care | Payer: Medicare Other | Attending: Emergency Medicine | Admitting: Emergency Medicine

## 2010-09-27 DIAGNOSIS — I10 Essential (primary) hypertension: Secondary | ICD-10-CM | POA: Insufficient documentation

## 2010-09-27 DIAGNOSIS — R05 Cough: Secondary | ICD-10-CM | POA: Insufficient documentation

## 2010-09-27 DIAGNOSIS — I252 Old myocardial infarction: Secondary | ICD-10-CM | POA: Insufficient documentation

## 2010-09-27 DIAGNOSIS — E109 Type 1 diabetes mellitus without complications: Secondary | ICD-10-CM | POA: Insufficient documentation

## 2010-09-27 DIAGNOSIS — R059 Cough, unspecified: Secondary | ICD-10-CM | POA: Insufficient documentation

## 2010-09-27 DIAGNOSIS — Z79899 Other long term (current) drug therapy: Secondary | ICD-10-CM | POA: Insufficient documentation

## 2010-09-27 DIAGNOSIS — B9789 Other viral agents as the cause of diseases classified elsewhere: Secondary | ICD-10-CM | POA: Insufficient documentation

## 2010-10-03 ENCOUNTER — Other Ambulatory Visit: Payer: Self-pay | Admitting: Internal Medicine

## 2010-10-04 ENCOUNTER — Telehealth (INDEPENDENT_AMBULATORY_CARE_PROVIDER_SITE_OTHER): Payer: Self-pay | Admitting: *Deleted

## 2010-10-12 NOTE — Progress Notes (Signed)
   Walk in Patient Form Recieved " Pt needs DMV form completed" sent to [facility]  October 04, 2010 9:44 AM

## 2010-10-25 ENCOUNTER — Ambulatory Visit: Payer: Medicare Other | Admitting: Internal Medicine

## 2010-10-25 ENCOUNTER — Other Ambulatory Visit: Payer: Self-pay | Admitting: Internal Medicine

## 2010-10-27 LAB — GLUCOSE, CAPILLARY: Glucose-Capillary: 218 mg/dL — ABNORMAL HIGH (ref 70–99)

## 2010-10-28 ENCOUNTER — Encounter: Payer: Self-pay | Admitting: Internal Medicine

## 2010-10-28 ENCOUNTER — Ambulatory Visit (INDEPENDENT_AMBULATORY_CARE_PROVIDER_SITE_OTHER): Payer: Medicare Other | Admitting: Internal Medicine

## 2010-10-28 VITALS — BP 117/67 | HR 66 | Temp 97.5°F | Ht 72.0 in | Wt 229.1 lb

## 2010-10-28 DIAGNOSIS — E785 Hyperlipidemia, unspecified: Secondary | ICD-10-CM

## 2010-10-28 DIAGNOSIS — E119 Type 2 diabetes mellitus without complications: Secondary | ICD-10-CM

## 2010-10-28 DIAGNOSIS — I2510 Atherosclerotic heart disease of native coronary artery without angina pectoris: Secondary | ICD-10-CM

## 2010-10-28 DIAGNOSIS — I509 Heart failure, unspecified: Secondary | ICD-10-CM

## 2010-10-28 DIAGNOSIS — I10 Essential (primary) hypertension: Secondary | ICD-10-CM

## 2010-10-28 LAB — BASIC METABOLIC PANEL
BUN: 17 mg/dL (ref 6–23)
CO2: 27 mEq/L (ref 19–32)
Chloride: 99 mEq/L (ref 96–112)
Glucose, Bld: 208 mg/dL — ABNORMAL HIGH (ref 70–99)
Potassium: 3.8 mEq/L (ref 3.5–5.3)

## 2010-10-28 NOTE — Assessment & Plan Note (Signed)
His recent LDL 40 in 05/2010 and will continue pravastatin.

## 2010-10-28 NOTE — Progress Notes (Signed)
  Subjective:    Patient ID: Charles Hart, male    DOB: Jan 06, 1956, 55 y.o.   MRN: [name]  HPI Patient is a 55 years old malewith past medical history  as outlined here who comes to the Clinic for regular follow up. He has no complaints, including fever, chill, chest pain, shortness of breath, hemoptysis, abdominal pain, nausea, vomiting, diarrhea, melena, dysuria, significant weight change. Denies recent smoking, alcohol or drug abuse. Has been taking all his medications as instructed.  His CBG never above 200 and no episodes of hypoglycemia.      Review of Systems Per HPI.  Current Outpatient Prescriptions  Medication Sig Dispense Refill  . amLODipine (NORVASC) 10 MG tablet Take 10 mg by mouth daily.        [name] aspirin 81 MG tablet Take 81 mg by mouth daily.        [name] BYETTA 10 MCG PEN 10 MCG/0.04ML SOLN INJECT 10 MICROGRAMS SUBCUTANEOUSLY TWO TIMES A DAY WITHIN 60 MINUTES PRIOR TO A MEAL  2.4 mL  3  . carvedilol (COREG) 25 MG tablet Take 25 mg by mouth 2 (two) times daily with a meal.        . cloNIDine (CATAPRES) 0.1 MG tablet Take 3 tablets (0.3 mg total) by mouth 2 (two) times daily.  540 tablet  1  . digoxin (LANOXIN) 0.25 MG tablet Take 250 mcg by mouth daily.        . furosemide (LASIX) 40 MG tablet Take 60 mg by mouth daily.        [name] gabapentin (NEURONTIN) 300 MG capsule Take 300 mg by mouth daily.        [name] lisinopril (PRINIVIL,ZESTRIL) 40 MG tablet Take 40 mg by mouth daily.        . metFORMIN (GLUCOPHAGE) 1000 MG tablet Take 1,000 mg by mouth 2 (two) times daily with a meal.        . Multiple Vitamin (MULTIVITAMIN) tablet Take 1 tablet by mouth daily.        . nitroGLYCERIN (NITROSTAT) 0.4 MG SL tablet Place 0.4 mg under the tongue every 5 (five) minutes as needed.        . pravastatin (PRAVACHOL) 40 MG tablet Take 40 mg by mouth daily.          Allergies Review of patient's allergies indicates no known allergies.  Past Medical History  Diagnosis Date  . Dermatitis     . CHF (congestive heart failure)   . Hypertension   . Diabetes mellitus   . Hyperlipidemia   . Hearing loss in right ear   . Cardiomyopathy     No past surgical history on file.      Objective:   Physical Exam General: Vital signs reviewed and noted. Well-developed,well-nourished,in no acute distress; alert,appropriate and cooperative throughout examination. Head: normocephalic, atraumatic. Neck: No deformities, masses, or tenderness noted. Lungs: Normal respiratory effort. Clear to auscultation BL without crackles or wheezes.  Heart: RRR. S1 and S2 normal without gallop, murmur, or rubs.  Abdomen: BS normoactive. Soft, Nondistended, non-tender.  No masses or organomegaly. Extremities: No pretibial edema.         Assessment & Plan:

## 2010-10-28 NOTE — Assessment & Plan Note (Addendum)
Lab Results  Component Value Date   NA 137 05/19/2010   K 4.0 05/19/2010   CL 97 05/19/2010   CO2 28 05/19/2010   BUN 16 05/19/2010   CREATININE 0.86 05/19/2010    BP Readings from Last 3 Encounters:  10/28/10 117/67  05/19/10 155/82  02/10/10 145/78   His BP well controlled and at goal. Will continue current meds and recheck BMET.

## 2010-10-28 NOTE — Patient Instructions (Signed)
Please take all your medications as instructed in your instructions.   We will call you if any abnormal lab results.  Please bring your glucometer with you at next visit.

## 2010-10-28 NOTE — Assessment & Plan Note (Addendum)
Lab Results  Component Value Date   HGBA1C 6.3 05/19/2010   CREATININE 0.86 05/19/2010   MICROALBUR 18.47* 05/19/2010   MICRALBCREAT 130.8* 05/19/2010   CHOL 123 05/19/2010   HDL 32* 05/19/2010   TRIG 257* 05/19/2010    Last eye exam and foot exam:    Component Value Date/Time   HMDIABEYEEXA mild dm retinopathy OU 08/03/2010   His CBG well controlled, runs 70s -190s. Rechecked A1C today 6.3 and unremarkable foot exam today.  Continue to take her DM meds as before.

## 2010-11-02 ENCOUNTER — Other Ambulatory Visit (HOSPITAL_COMMUNITY): Payer: Self-pay | Admitting: Radiology

## 2010-11-02 DIAGNOSIS — R011 Cardiac murmur, unspecified: Secondary | ICD-10-CM

## 2010-11-03 ENCOUNTER — Encounter: Payer: Self-pay | Admitting: Internal Medicine

## 2010-11-03 ENCOUNTER — Ambulatory Visit (INDEPENDENT_AMBULATORY_CARE_PROVIDER_SITE_OTHER): Payer: Medicare Other | Admitting: Cardiovascular Disease

## 2010-11-03 ENCOUNTER — Ambulatory Visit (INDEPENDENT_AMBULATORY_CARE_PROVIDER_SITE_OTHER): Payer: Medicare Other | Admitting: Internal Medicine

## 2010-11-03 ENCOUNTER — Ambulatory Visit (HOSPITAL_COMMUNITY): Payer: Medicare Other | Attending: Cardiology | Admitting: Radiology

## 2010-11-03 ENCOUNTER — Encounter: Payer: Self-pay | Admitting: Cardiovascular Disease

## 2010-11-03 VITALS — BP 132/77 | HR 65 | Temp 97.3°F | Wt 228.0 lb

## 2010-11-03 DIAGNOSIS — I509 Heart failure, unspecified: Secondary | ICD-10-CM | POA: Insufficient documentation

## 2010-11-03 DIAGNOSIS — E785 Hyperlipidemia, unspecified: Secondary | ICD-10-CM

## 2010-11-03 DIAGNOSIS — L259 Unspecified contact dermatitis, unspecified cause: Secondary | ICD-10-CM

## 2010-11-03 DIAGNOSIS — E119 Type 2 diabetes mellitus without complications: Secondary | ICD-10-CM | POA: Insufficient documentation

## 2010-11-03 DIAGNOSIS — I10 Essential (primary) hypertension: Secondary | ICD-10-CM | POA: Insufficient documentation

## 2010-11-03 DIAGNOSIS — R011 Cardiac murmur, unspecified: Secondary | ICD-10-CM

## 2010-11-03 DIAGNOSIS — I428 Other cardiomyopathies: Secondary | ICD-10-CM

## 2010-11-03 DIAGNOSIS — I2510 Atherosclerotic heart disease of native coronary artery without angina pectoris: Secondary | ICD-10-CM | POA: Insufficient documentation

## 2010-11-03 MED ORDER — TRIAMCINOLONE ACETONIDE 0.1 % EX CREA: TOPICAL_CREAM | CUTANEOUS | Status: DC

## 2010-11-03 NOTE — Patient Instructions (Signed)
Apply thin film of triamcinolone 0.1% cream  to affected area of skin two times a day for 10 days; call if symptoms persist or worsen.

## 2010-11-03 NOTE — Progress Notes (Signed)
55 yo with DM,HTN history of abnormal ECG and DCM.  Previous EF 30-35% with poorly controlled BP.  Improved to normal on echo 2010 with compliance of meds.  Has not been seen in two years.  Needs form filled out for commercial liscence but needs echo today before writing.  No dyspnea,sscp,palpitatins,edema or syncope.  Primary has stopped his actos and glipizide and started Byetta.  Compliant with meds and BP has been good.  ECG chronically abnormal with LVH changes.  Saw Dr Katy Fitch in January and eyes are fine.  No other end organ damage from DM.    ROS: Denies fever, malais, weight loss, blurry vision, decreased visual acuity, cough, sputum, SOB, hemoptysis, pleuritic pain, palpitaitons, heartburn, abdominal pain, melena, lower extremity edema, claudication, or rash.   General:  Affect appropriate Healthy:  appears stated age 55: normal Neck supple with no adenopathy JVP normal no bruits no thyromegaly Lungs clear with no wheezing and good diaphragmatic motion Heart:  S1/S2 no murmur,rub, gallop or click PMI normal Abdomen: benighn, BS positve, no tenderness, no AAA no bruit.  No HSM or HJR Distal pulses intact with no bruits No edema Neuro non-focal Skin warm and dry No muscular weakness   Current Outpatient Prescriptions  Medication Sig Dispense Refill  . amLODipine (NORVASC) 10 MG tablet Take 10 mg by mouth daily.        [name] aspirin 81 MG tablet Take 81 mg by mouth daily.        [name] BYETTA 10 MCG PEN 10 MCG/0.04ML SOLN INJECT 10 MICROGRAMS SUBCUTANEOUSLY TWO TIMES A DAY WITHIN 60 MINUTES PRIOR TO A MEAL  2.4 mL  3  . carvedilol (COREG) 25 MG tablet Take 25 mg by mouth 2 (two) times daily with a meal.        . cloNIDine (CATAPRES) 0.1 MG tablet Take 3 tablets (0.3 mg total) by mouth 2 (two) times daily.  540 tablet  1  . digoxin (LANOXIN) 0.25 MG tablet Take 250 mcg by mouth daily.        . furosemide (LASIX) 40 MG tablet Take 60 mg by mouth daily.        [name] gabapentin (NEURONTIN) 300  MG capsule Take 300 mg by mouth daily.        [name] lisinopril (PRINIVIL,ZESTRIL) 40 MG tablet Take 40 mg by mouth daily.        . metFORMIN (GLUCOPHAGE) 1000 MG tablet Take 1,000 mg by mouth 2 (two) times daily with a meal.        . Multiple Vitamin (MULTIVITAMIN) tablet Take 1 tablet by mouth daily.        . nitroGLYCERIN (NITROSTAT) 0.4 MG SL tablet Place 0.4 mg under the tongue every 5 (five) minutes as needed.        . pravastatin (PRAVACHOL) 40 MG tablet Take 40 mg by mouth daily.        [name] triamcinolone (KENALOG) 0.1 % cream Apply thin film to affected area two times a day for 10 days.  15 g  1  . DISCONTD: triamcinolone (KENALOG) 0.1 % cream Apply 1 application topically 2 (two) times daily.          Allergies:   Review of patient's allergies indicates no known allergies.  Assessment and Plan

## 2010-11-03 NOTE — Assessment & Plan Note (Signed)
Patient's exam is consistent with a small area of eczema. The plan is to treat with triamcinolone 0.1% topically twice a day for 10 days. I advised him to call if his skin lesions persist or worsen.

## 2010-11-03 NOTE — Assessment & Plan Note (Signed)
Well controlled.  Continue current medications and low sodium Dash type diet.    

## 2010-11-03 NOTE — Assessment & Plan Note (Addendum)
Getting echo today will review.  If normal EF will write DMV note  Functional class one with no symptoms  Reviewed echo normal EF and only mild LVE.  Finished DMV form and cleared to drive.  Needs similar letter from  Dr Marinda Elk regarding DM

## 2010-11-03 NOTE — Assessment & Plan Note (Signed)
Glad he is off actos  F/U primary   Lab Results  Component Value Date   HGBA1C 6.3 10/28/2010

## 2010-11-03 NOTE — Progress Notes (Signed)
  Subjective:    Patient ID: Charles Hart, male    DOB: [birth year], 55 y.o.   MRN: [id number]  HPI Patient returns for a medical assessment so he can renew his driver's license, and also with a complaint of a small recurrent hyperpigmented skin lesion on his back and in his left groin.  He has no complaints other than the small skin lesions; he says that these resolved in the past when treated with a topical steroid cream. Review of his chart shows that he was then treated with triamcinolone 0.1% cream.  Regarding his driver assessment, he reports that he has been seen by his ophthalmologist who did a form for this, and he also has an appointment with his cardiologist Dr. [name] and has followup scheduled today for an echocardiogram. His blood sugars have been well controlled; he denies any episodes of hypoglycemia. He reports that he is compliant with his medications. He denies any vision problems, chest pain, dyspnea, orthopnea, PND, or lower extremity edema. He reports that he does not drink alcohol and that he has not consumed alcohol since 1986; he denies any use of street drugs.  He denies any history of syncope, near syncope, or seizure.  Review of Systems  Constitutional: Negative for fever, chills and activity change.  Respiratory: Negative for shortness of breath.   Cardiovascular: Negative for chest pain, palpitations and leg swelling.  Gastrointestinal: Negative for nausea, vomiting, abdominal pain and blood in stool.  Genitourinary: Negative for dysuria and difficulty urinating.  Neurological: Negative for dizziness, seizures, syncope, weakness, numbness and headaches.       Objective:   Physical Exam  Constitutional: He is oriented to person, place, and time. He appears well-developed and well-nourished. No distress.  HENT:  Head: Normocephalic and atraumatic.  Cardiovascular: Normal rate, regular rhythm and normal heart sounds.  Exam reveals no gallop and no friction rub.     No murmur heard. Pulmonary/Chest: Effort normal and breath sounds normal. No respiratory distress. He has no wheezes. He has no rales.  Abdominal: Soft. Bowel sounds are normal. He exhibits no distension. There is no tenderness.  Neurological: He is alert and oriented to person, place, and time.  Skin:             Assessment & Plan:   Assessment for Driver's License.  Patient has well-controlled diabetes, hypertension, and hyperlipidemia; he has a history of congestive heart failure and cardiomyopathy with much improved left ventricular function.  He has no symptoms on his current medication regimen. He denies any alcohol since 1986. I see no medical contraindication to his continuing to drive, and I filled out the sections the form that he brought to me (of note, the initial section of previous forms was not present in the forms provided to me; I explained this to him to let me know If I need to to fill out any additional pages).  He also has provided forms to his cardiologist and ophthalmologist for vision and cardiac assessment.

## 2010-11-03 NOTE — Assessment & Plan Note (Signed)
Lab Results  Component Value Date   LDLCALC 40 05/19/2010   Excellent control with no side effects

## 2010-11-05 LAB — GLUCOSE, CAPILLARY: Glucose-Capillary: 171 mg/dL — ABNORMAL HIGH (ref 70–99)

## 2010-11-08 ENCOUNTER — Telehealth: Payer: Self-pay | Admitting: *Deleted

## 2010-11-08 NOTE — Telephone Encounter (Signed)
Message copied by Fredia Beets on Mon Nov 08, 2010  4:50 PM ------      Message from: [name]      Created: Mon Nov 08, 2010 10:53 AM       Normal echo with good EF and no significant valvular heart disease

## 2010-11-08 NOTE — Telephone Encounter (Signed)
pt aware of results  

## 2010-11-20 LAB — GLUCOSE, CAPILLARY: Glucose-Capillary: 169 mg/dL — ABNORMAL HIGH (ref 70–99)

## 2010-11-24 LAB — BASIC METABOLIC PANEL
BUN: 17 mg/dL (ref 6–23)
CO2: 32 mEq/L (ref 19–32)
Calcium: 8.9 mg/dL (ref 8.4–10.5)
Chloride: 97 mEq/L (ref 96–112)
Creatinine, Ser: 0.79 mg/dL (ref 0.4–1.5)
GFR calc Af Amer: >60 mL/min (ref >60)
Glucose, Bld: 252 mg/dL — ABNORMAL HIGH (ref 70–99)

## 2010-12-14 ENCOUNTER — Emergency Department (HOSPITAL_COMMUNITY)
Admission: EM | Admit: 2010-12-14 | Discharge: 2010-12-15 | Disposition: A | Discharge status: Home / Self Care | Payer: Medicare Other | Attending: Emergency Medicine | Admitting: Emergency Medicine

## 2010-12-14 DIAGNOSIS — L301 Dyshidrosis [pompholyx]: Secondary | ICD-10-CM | POA: Insufficient documentation

## 2010-12-14 DIAGNOSIS — Z7982 Long term (current) use of aspirin: Secondary | ICD-10-CM | POA: Insufficient documentation

## 2010-12-14 DIAGNOSIS — E119 Type 2 diabetes mellitus without complications: Secondary | ICD-10-CM | POA: Insufficient documentation

## 2010-12-14 DIAGNOSIS — I509 Heart failure, unspecified: Secondary | ICD-10-CM | POA: Insufficient documentation

## 2010-12-14 DIAGNOSIS — R21 Rash and other nonspecific skin eruption: Secondary | ICD-10-CM | POA: Insufficient documentation

## 2010-12-14 DIAGNOSIS — Z79899 Other long term (current) drug therapy: Secondary | ICD-10-CM | POA: Insufficient documentation

## 2010-12-14 DIAGNOSIS — E7800 Pure hypercholesterolemia, unspecified: Secondary | ICD-10-CM | POA: Insufficient documentation

## 2010-12-14 DIAGNOSIS — I10 Essential (primary) hypertension: Secondary | ICD-10-CM | POA: Insufficient documentation

## 2010-12-14 DIAGNOSIS — I252 Old myocardial infarction: Secondary | ICD-10-CM | POA: Insufficient documentation
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
## 2010-12-28 NOTE — Assessment & Plan Note (Signed)
[place]                            CARDIOLOGY OFFICE NOTE   Charles Hart, Charles Hart                      MRN:          [name]  DATE:03/08/2007                            DOB:          1956/07/04    [name] returns today for followup. He has a non-ischemic  cardiomyopathy. His blood pressure has been better controlled. We added  clonidine the last time we saw him.   He is trying to avoid salt intake. 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   His EF is in the 45% range.   He has not had any significant PND or orthopnea. There has been no lower  extremity edema.   He tells me his blood sugars have been under reasonable control.   He needs a followup lipid and liver profile in six months.   REVIEW OF SYSTEMS:  Is otherwise negative.   MEDICATIONS:  1. Glipizide 20 a day.  2. Metformin 1 gram b.i.d.  3. Coreg 25 b.i.d.  4. Lisinopril 40 a day.  5. Lipitor 40 a day.  6. Amlodipine 10 a day.  7. Lasix 40 a day.  8. Digoxin 0.25 a day.  9. Actos 45 a day.  10.Protonix 40 a day.  11.Gabapentin 300 a day.  12.Aspirin a day.  13.Clonidine 0.1 b.i.d.   PHYSICAL EXAMINATION:  Remarkable for a blood pressure of 140/80,  respiratory rate of 14. He is afebrile. Weight is 244. Pulse 72 and  regular.  HEENT: Is normal.  NECK: Supple. There is no thyromegaly. No lymphadenopathy. No JVP  elevation. No bruits.  LUNGS:  Are clear with good diaphragmatic motion. No wheezing.  There is an S1, S2 with an S4 gallop. There is no murmur.  PMI is  normal.  ABDOMEN: Is benign. There are no renal bruits. There is no tenderness.  Bowel sounds are positive. No hepatosplenomegaly or hepatojugular  reflux. No AAA.  Distal pulses are intact. No edema. PT's are +2 bilaterally, femoral's  are +3 bilaterally. There are no bruits.  NEURO: Is nonfocal.  There is no muscular weakness.   EKG: Shows sinus rhythm with LVH and strain.   IMPRESSION:  1. Stable hypertension, bettered controlled. Continue low salt diet.      He is requiring quite a bit of medication, but it seems to be      better controlled. At some point in the future would be nice to get      him off of Actos in regards to fluid retention.  2. Hyperlipidemia. Followup lipid and liver profile in six months.      Continue Lipitor 40 a day. No evidence of coronary disease by      catheterization.  3. Lower extremity edema, resolved on Lasix 40 mg a day. 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  4. Diabetes, followup with primary care doctor. Continue quarterly      hemoglobin A1c.  To see the eye doctor on a yearly basis.   Overall, I think Charles is doing well and I will see him back in six  months.     Wallis Bamberg. [name], MD, [facility]  Electronically Signed    PCN/MedQ  DD: 03/08/2007  DT: 03/08/2007  Job #: [id number]

## 2010-12-31 NOTE — Cardiovascular Report (Signed)
NAME:  Charles Hart, HUFNAGLE NO.:  [id number]   MEDICAL RECORD NO.:  [id number]          PATIENT TYPE:  OIB   LOCATION:  1965                         FACILITY:  [facility]   PHYSICIAN:  Wallis Bamberg. [name], MD, FACCDATE OF BIRTH:  12/20/1955   DATE OF PROCEDURE:  DATE OF DISCHARGE:                            CARDIAC CATHETERIZATION   CORONARY ARTERIOGRAPHY   INDICATION:  A 55 year old patient with a history of cardiomyopathy,  improved LV function by MRI, abnormal Myoview.  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   Catheterization was done with 4-French catheters from right femoral  artery.   Left main coronary artery was normal.   Left anterior descending artery was a large tortuous vessel that wrapped  the apex.  First diagonal branch was normal.   The LAD proper was normal.   Circumflex coronary artery was nondominant.  It was normal.   The right coronary was dominant.  It was normal.   RAO ventriculography:  RAO ventriculography showed mild left ventricular  cavity enlargement.  The ejection fraction was 70%.  There was no  gradient across the aortic valve and no MR.  The patient had significant  hypertension.   He did not take his blood pressure medicines prior to the  catheterization.  His aortic pressure was in the 170/95 range.  LV  pressure was in the 170/8 range.   Selective renal arteriography was performed due to the patient's severe,  4-drug hypertension.  There is no evidence of renal artery stenosis.  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   IMPRESSION:  The patient does not have significant coronary disease.  He  has normal left ventricular function.  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   I will continue to treat his risk factors including diabetes and  hypertension aggressively.   He tolerated the procedure well.      Wallis Bamberg. [name], MD, [facility]  Electronically Signed     PCN/MEDQ  D:  09/19/2006  T:  09/19/2006  Job:  [id number]

## 2010-12-31 NOTE — Discharge Summary (Signed)
NAME:  Charles Hart, Charles Hart                       ACCOUNT NO.:  [id number]   MEDICAL RECORD NO.:  [id number]                   PATIENT TYPE:  INP   LOCATION:  3705                                 FACILITY:  [facility]   PHYSICIAN:  C. Milta Deiters, M.D.             DATE OF BIRTH:  1955-12-27   DATE OF ADMISSION:  DATE OF DISCHARGE:  08/16/2002                                 DISCHARGE SUMMARY   CHIEF COMPLAINT:  Chest pain.   DISCHARGE DIAGNOSES:  1. Chest pain.  Rule out myocardial infarction.     a. Cardiolyte stress test showed inferior infarct with peri-infarct        ischemia, ejection fraction of 27%.     b. Catheterization August 16, 2002 was negative for any blockage and        showed an ejection fraction of 20 to 30%.  Patient discharged with        medical management.  2. 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  3. Hypertension.  4. Dyslipidemia.  5. Type-2 diabetes.  6. History of alcohol abuse, quit back in 1986.   DISCHARGE MEDICATIONS:  Lasix 40 mg p.o. q.d., aspirin 81 mg p.o. q.d.,  lisinopril 20 mg p.o. q.d., metoprolol 50 mg p.o. b.i.d., Zocor 40 mg p.o.  q.d., Glucotrol XL 10 mg p.o. q.d., digoxin 0.625 mg p.o. q.d.   PROCEDURES:  He had a Cardiolyte stress test done and also had cardiac  catheterization done by [facility], Dr. Christy Sartorius.   CONSULTATIONS:  Cardiology at [facility].   FOLLOW UP APPOINTMENTS:  He will call to make an appointment to follow up in  the outpatient clinic with Dr. Drue Flirt.  The clinic was closed at the time  of his discharge.   HISTORY AND PHYSICAL EXAMINATION:  Charles Hart is a 55 year old  African American male with past medical history significant for congestive  heart failure, dyslipidemia and hypertension.  He was admitted with chest  pain to rule out myocardial infarction.  He complained of left-sided chest  pain that decreased with one  sublingual nitroglycerin.  Pain recurred  throughout the night and was relieved again with nitroglycerin.  Pain  radiated to the left axilla.  No complaints of cough, shortness of breath,  hemoptysis, fever or chills.  No recent alcohol use.  No history of cocaine  use.  He does not have any orthopnea at baseline.  He sleeps on two pillows  which is normal for him.   Allergies:  He has no known drug allergies.  Past Medical History:  As per  discharge problem list.  Admission Medications:  Hydrochlorothiazide 25 mg  p.o. q.d., lisinopril 20 mg p.o., Lasix 40 mg p.o. q.d., Glipizide 10 mg  p.o. q.d., Lopressor 50 mg p.o. b.i.d., Zocor 40 mg p.o. q.d.  Social  History:  He is married.  He  does not smoke and does not drink.  He is not  employed at present.  Review of Systems:  Per history of present illness.   Vital Signs:  Pulse 61, blood pressure 147/82, temperature 97.5,  respirations 28, oxygen saturations 96% on room air.  On admission history  and physical, he was awake and alert in no acute distress.  Pupils equally  round and reactive to light.  Sclerae were anicteric.  ENT:  No jugular  venous distention.  Neck was supple.  Lungs were clear to auscultation  bilaterally.  No wheezes, rhonchi or rales.  Cardiovascular:  Regular rate  and rhythm.  No murmurs, rubs or gallops.  Gastrointestinal:  His abdomen  was soft, nontender and nondistended with positive bowel sounds.  Extremities:  Were without edema.  Neurological examination was nonfocal.  Psychiatric:  He had appropriate affect.   HOSPITAL COURSE:  1. Chest Pain:  He does have a history of dilated cardiomyopathy and he had     risk factor for coronary artery disease.  He had a Cardiolyte that showed     some inferior ischemia but on catheterization he had no focal lesion.  He     did have about a 30% blockage in the right coronary artery and 30% in the     proximal circumflex and about 20% in the left anterior descending and  his     ejection fraction was 20%.  The patient was discharged on medical     management, beta blocker, angiotensin-converting enzyme inhibitor, Lasix,     digoxin.   1. Diabetes:  His blood sugar was controlled on his home regimen of     Glucotrol during his hospitalization.   1. Hyperlipidemia:  He was continued on his Zocor.   1. Hypertension:  His blood pressure was controlled on the medications     mentioned in #1.   1. Dilated Cardiomyopathy:  Controlled per the medications mentioned in #1.     He is on his regimen for heart failure, ejection fraction 20 to 30%, so     digoxin was started and has done well with this regimen.   DISCHARGE LABORATORIES:  Comprehensive Metabolic Profile:  Sodium 142,  potassium 4.2, chloride 102, carbon dioxide 30, blood urea nitrogen 14,  creatinine 0.9, glucose 148, calcium 9.5, magnesium 1.9.  Complete Blood  Count:  White blood count 4.5, hemoglobin 14, platelets 241, mean cell  volume 83.  Cardiac Catheterization Results:  Cardiac cath clean with  ejection fraction of 20 to 30%.  He had an echocardiogram done also.  The  echocardiogram showed that the left ventricle was moderately dilated,  overall left ventricular systolic function was moderately decreased with  ejection fraction of 30 to 40%, left ventricular wall thickness was mildly  increased, aortic valve was mildly calcified, mild mitral regurgitation and  left atrium was mildly dilated.  Then he had a Cardiolyte stress test done  that showed some inferior ischemia.  Cardiac Enzymes:  Three sets were  negative.  Lipid Profile:  Total cholesterol 146, triglycerides 298, high-  density lipoprotein 34, low-density lipoprotein 52.     Sharlet Salina, M.D.                      Renato Shin, M.D.    [place]/MEDQ  D:  08/16/2002  T:  08/16/2002  Job:  [id number]   cc:   [facility]

## 2010-12-31 NOTE — Cardiovascular Report (Signed)
NAME:  Charles Hart, Charles Hart NO.:  [id number]   MEDICAL RECORD NO.:  [id number]                   PATIENT TYPE:  INP   LOCATION:  3705                                 FACILITY:  [facility]   PHYSICIAN:  Christy Sartorius, M.D. [facility]               DATE OF BIRTH:  1956/08/10   DATE OF PROCEDURE:  08/16/2002  DATE OF DISCHARGE:                              CARDIAC CATHETERIZATION   PROCEDURES:  1. Left heart catheterization.  2. Left ventriculogram.  3. Selective coronary angiography.   DIAGNOSES:  1. Mild coronary artery disease by angiogram.  2. Severe left ventricular systolic dysfunction.   HISTORY:  The patient is a 55 year old black gentleman with a history of  nonischemic cardiomyopathy who presents with shortness of breath and chest  discomfort.  The patient was admitted to the hospital, stabilized medically,  and underwent stress imaging studies suggesting ischemia in the inferior  wall.  He was referred for further cardiac assessment.   TECHNIQUE:  Informed consent was obtained.  The patient brought to the  catheterization lab.  A 6-French sheath placed in the right femoral artery  using modified Seldinger technique.  A 6-French JL4 and JR4 catheters  introduced into each left and right coronary arteries and selective  angiography performed in various projections using manually injected  contrast.  A 6-French pigtail catheter was then advanced in the left  ventricle and a left ventriculogram performed using power injected contrast.  At the termination of the case, catheters and sheaths were removed and  manual pressure applied until adequate hemostasis was achieved.  The patient  tolerated the procedure well and was transferred to the floor in stable  condition.   FINDINGS:  1. Left main __________:  Large caliber vessel with mild irregularities.  2. LAD:  Large caliber vessel that provides a diagonal branch in the mid     section.  The LAD has mild  lesion of 20% near the takeoff of the diagonal     branch.  3. Left circumflex artery:  This is a large caliber vessel that provides two     marginal branches in the mid section.  The AV circumflex has moderate     narrowing of 30% near the takeoff of the first marginal branch.  The     remainder of the vessel has luminal disease.  4. Ramus intermedius:  This is a large caliber vessel __________ the     anterior lateral wall.  There are mild irregularities in the ramus     system.  5. Right coronary artery:  Dominant.  This is a large caliber vessel     apprised of posterior descending artery and posterior ventricular branch.     The right coronary has mild narrowing of 30% of the proximal segment with     further narrowing of 30% in the mid section in the area of severe     tortuosity.  6. LV:  Dilated end-systolic and end-diastolic dimensions.  Overall left     ventricular function is severely impaired.  Ejection fraction     approximately 20%.  There is global hypokinesis.  No mitral regurgitation     is appreciated.  LV pressure is 130/0, aortic is 130/80, LV equals 20.    ASSESSMENT AND PLAN:  The patient is a 55 year old gentleman with mild  coronary artery disease and severe left ventricular dysfunction.  Aggressive  medical therapy will be pursued.                                               Christy Sartorius, M.D. [facility]    NG/MEDQ  D:  08/16/2002  T:  08/16/2002  Job:  [id number]   cc:   [name], M.D. [facility]

## 2010-12-31 NOTE — Cardiovascular Report (Signed)
   NAME:  Charles Hart, Charles Hart NO.:  [id number]   MEDICAL RECORD NO.:  [id number]                   PATIENT TYPE:  INP   LOCATION:  3705                                 FACILITY:  [facility]   PHYSICIAN:  Christy Sartorius, M.D. [facility]               DATE OF BIRTH:  June 06, 1956   DATE OF PROCEDURE:  08/16/2002  DATE OF DISCHARGE:                              CARDIAC CATHETERIZATION   PROCEDURES PERFORMED:  1. Left heart catheterization.  2. Left ventriculogram.  3. Selective coronary angiography.   DIAGNOSES:  1. Mild coronary artery disease by angiogram.  2. Severe left ventricular systolic dysfunction.   Dictation ended here.                                               Christy Sartorius, M.D. [facility]    NG/MEDQ  D:  08/16/2002  T:  08/16/2002  Job:  [id number]   cc:   [name], M.D. [facility]   [facility]

## 2010-12-31 NOTE — Assessment & Plan Note (Signed)
[place]                            CARDIOLOGY OFFICE NOTE   MERCURY, GATCHELL                      MRN:          [id number]  DATE:09/01/2006                            DOB:          February 13, 1956    [name] is seen today in followup.  I have seen him in the distant  past.   He was referred by Dr. Bertha Stakes for re-evaluation per the Department  of Transportation request.  The patient has a history of cardiomyopathy.  He wants to drive a school bus.  A letter from the [place] [employer] indicated that the patient needs cardiovascular evaluation  with exercise stress testing.   The patient has been doing fairly well.  He gets mild exertional  dyspnea.  He has not had any significant chest pain, PND, orthopnea or  palpitations.  He has never had syncope.   He carries the diagnosis of non-ischemic cardiomyopathy.  He had a heart  catheterization performed by Dr. Lyndel Safe in January of 2004, which showed  no significant coronary artery disease, but severe LV dysfunction.  His  ejection fractions have fluctuated in the past between 25% and 40%.   He has not had a recent evaluation of his heart function.   REVIEW OF SYSTEMS:  Otherwise remarkable for multiple coronary risk  factors, which include positive family history, diabetes, hypertension  and hyperlipidemia.   His diet is somewhat noncompliant.  He says he uses no salt, but seems  to eat out a lot.  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   He has had previous dental surgery and a previous catheterization in  2004.   Dr. Marinda Elk follows his diabetes.  I do not know what his hemoglobin A1c  has been.   FAMILY HISTORY:  Remarkable for mother dying at an early age of 27 of  probable heart complications.  Father died at age 18.  He has a sister  who has had cancer.   MEDICATIONS INCLUDE:  1. Glipizide  10 a day.  2. Metformin 1 g a day.  3. Carvedilol 25 b.i.d.  4. Lisinopril 40 a day.  5. Lipitor 40 a day.  6. Amlodipine 10 a day.  7. Lasix 40 a day.  8. Dig. 0.25.  9. Actos 45 a day.  10.Protonix 40 a day.  11.Gabapentin 300 a day.   EXAM:  The patient's exam is remarkable for a blood pressure of 136/80,  pulse is 76 and regular.  HEENT is normal.  He is somewhat edentulous.  There are no carotid bruits.  JVP is mildly elevated above the clavicle.  The lungs are clear.  There is an S1, S2 with a soft systolic ejection  murmur.  Abdomen is benign.  Lower extremities with intact pulses and  trace edema.   The EKG shows sinus rhythm with LVH and Dig. effect.   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   Clearly, he  needs further cardiovascular workup.  They are requiring a  stress Myoview test, which we will order.   In the past, his myopathy has been non-ischemic.  However, if his EF is  less than 35%, there is a risk of arrhythmias.  He has not had any  clinical arrhythmias or VT.   I told him the most accurate way to assess his LV function and scar  tissue would be with cardiac MRI.  I can quantitate his ejection  fraction and his risk of arrhythmia would be much lower, if his  myocardium had no scar tissue and an EF greater than 40%.  We will try  to arrange this for Monday.   His current medications seem adequate.  I will talk to Dr. Marinda Elk  regarding changing his Actos.   Particularly in a patient with cardiomyopathy, the fluid-retaining  properties of Actos are not ideal.  Outside of this, his medications  seem fine.  His hypertension seems reasonably well-controlled.   His EKG is not interpretable, due to the LVH and Dig. effect.   It is also encouraging that his functional status has been level 1 to 2,  and he has not had significant chest pain, plus, at least in the past,  as of 2004, despite his diabetes and multiple  risk factors, he did not  have significant coronary artery blockages.   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     Wallis Bamberg. [name], MD, [facility]  Electronically Signed    PCN/MedQ  DD: 09/01/2006  DT: 09/01/2006  Job #: [id number]   cc:   Jay Schlichter, M.D.

## 2010-12-31 NOTE — Discharge Summary (Signed)
NAME:  Charles Hart, Charles Hart NO.:  [id number]   MEDICAL RECORD NO.:  [id number]          PATIENT TYPE:  INP   LOCATION:  [id number]                         FACILITY:  [facility]   PHYSICIAN:  Alver Fisher, M.D.    DATE OF BIRTH:  1956-05-24   DATE OF ADMISSION:  02/12/2005  DATE OF DISCHARGE:  02/14/2005                                 DISCHARGE SUMMARY   DISCHARGE DIAGNOSES:  1.  Chest pain rule out myocardial infarction. Cardiac panels were negative      and electrocardiogram showed no changes from previous.  2.  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  3.  Hypertension.  4.  Diabetes mellitus type 2.  5.  Dyslipidemia.  6.  Alcohol abuse, quit in 1986.  7.  Gastritis/reflux esophagitis.   PROCEDURE:  None.   FOLLOW UP:  Dr. Garnette Scheuermann  on February 23, 2005 at 2:00 p.m. at the [facility].   DISCHARGE MEDICATIONS:  1.  Metformin 1,000 mg every morning and 1,000 mg every evening.  2.  Glipizide 10 mg twice daily.  3.  Avandia 80 mg once daily.  4.  Lipitor 80 mg once daily.  5.  Furosemide 40 mg once daily.  6.  Aspirin 325 mg once daily.  7.  Coreg 25 mg twice daily.  8.  Digoxin 0.25 mg once a day.  9.  Gabapentin 300 mg per day.  10. Norvasc 5 mg per day.  11. Nitroglycerin 0.4 mg to place under the tongue as needed.  12. Protonix 40 mg once per day.   HISTORY OF PRESENT ILLNESS:  Charles Hart is a 55 year old African-  American male with a past medical history significant for congestive heart  failure, dyslipidemia, and hypertension. He was admitted with chest pain to  rule out myocardial infarction. He complained of left sided chest pain that  was severe in intensity and radiated towards the left. It was not associated  with shortness of breath, epigastric pain, fever, or lower leg swelling. He  has no history of cocaine use and he does not have orthopnea at baseline.   ALLERGIES:  No known drug allergies.   PAST MEDICAL HISTORY:  As per discharge  problem list.   ADMISSION MEDICATIONS:  1.  Metformin 1,000 mg twice daily.  2.  Glipizide 10 mg 2 times a day.  3.  Avandia 8 mg daily.  4.  Lisinopril 40 mg daily.  5.  Lipitor 40 mg daily.  6.  Lasix 40 mg daily.  7.  Aspirin 325 mg.  8.  Digoxin 0.25 mg daily.  9.  Coreg 25 mg b.i.d.  10. Gabapentin 30 mg daily.  11. Nitroglycerin 0.4 mg sublingual p.r.n.  12. Norvasc 50 mg daily.   SOCIAL HISTORY:  He is single. He does not smoke and drink. He is not  employed at present.   REVIEW OF SYSTEMS:  Per history of present illness.   PHYSICAL EXAMINATION:  VITAL SIGNS:  At the time of admission his blood  pressure was 149/76, pulse 67, temperature 98.2. Respiratory rate was 40 and  O2 saturation was 97% on  room air.  GENERAL:  The patient was alert, cooperative, and friendly.  HEENT:  No abnormalities.  RESPIRATORY:  Chest clear to auscultation bilaterally.  CARDIOVASCULAR:  Heart rate regular. S1 and S2 present. No rubs or murmurs.  GASTROINTESTINAL:  Bowel sounds present, soft, non-tender, non-distended.  EXTREMITIES:  Pedal edema absent. Jaundice absent.  NEUROLOGIC/PSYCHIATRIC:  The patient was oriented x3. Cranial nerves are  intact. Motor sensory systems are intact.   HOSPITAL COURSE:  1.  Chest pain. The patient had a history of cardiomyopathy. He had an      ejection fraction of 20% and an echocardiogram done in 2004. He also had      a cardiac catheterization which showed obstruction of 30% in the      circumflex, 30% right coronary artery, and 20% in the left anterior      descending. He was also diagnosed with alcoholic cardiomyopathy because      of history of alcohol abuse in the past. He has also had a negative      Adenosine Cardiolite in January 2006 and this did not show any ischemia.      On is current admission, his electrocardiogram showed a 1 mm ST      depression in 2, 3 and AVF, which were consistent with old      electrocardiogram's. The cardiac  markers were:  creatinine kinase 136,      CK MB 1.0, and troponin I 0.02. The other cardiac markers that followed      were also within the normal range. His chest pain decreased      significantly after admission. A serum lipase was also done to rule out      pancreatitis which showed a value a 34 and the drug was also negative.   1.  Hypertension. The patient's blood pressure was stable and he was      continued on the same medications.   1.  Diabetes type 2. CBG's were done. Sliding scale insulin was used. His      hemoglobin A1C was 7.8. He was started on all the medication at the time      of discharge.   1.  Gastritis and reflux esophagitis. He was put on Protonix empirically and      his chest pain improved significantly after Protonix.   1.  Dyslipidemia. He was managed with Zocor.   DISCHARGE LABORATORY DATA:  Hemoglobin 11.9. White blood cell count 4.1.  Drug screen was negative. HBA1C was 7.8. Lipase was 34. Triglycerides were  24, LDL 41, SGL 29, and cholesterol 118. His sodium was 137, potassium 3.6,  chloride 102, and CO2 28. BUN 18, creatinine 0.8. His blood glucose was 184.  Digoxin level was 0.7.   Electrocardiogram was consistent with the findings of old  electrocardiogram's.       YB/MEDQ  D:  02/19/2005  T:  02/19/2005  Job:  [id number]   cc:   Jay Schlichter, M.D.  [address] [place] [address]  Fax: [phone]

## 2010-12-31 NOTE — Consult Note (Signed)
NAME:  Charles Hart, REEL NO.:  [id number]   MEDICAL RECORD NO.:  [id number]                   PATIENT TYPE:  INP   LOCATION:  3705                                 FACILITY:  [facility]   PHYSICIAN:  [name], M.D. [facility]              DATE OF BIRTH:  March 18, 1956   DATE OF CONSULTATION:  08/13/2002  DATE OF DISCHARGE:                                   CONSULTATION   HISTORY OF PRESENT ILLNESS:  The patient is a 55 year old unemployed  individual who was recently admitted to [facility] for congestive heart  failure.  He was discharged about a week ago.  He returned last night with  atypical substernal chest pain.  He has a history of a catheterization in  2002.  He is not sure who performed this.  At that time he says he did not  have any blocked arteries.  He had a presumed diagnosis of alcohol-induced  cardiomyopathy with an EF in the 30% range.  He has hypertension, diabetes,  hyperlipidemia.   His chest pain is nonexertional.  It radiates to the left side of his chest.  It is sharp.  It is not totally relieved with nitroglycerin.  He is  currently asymptomatic with no shortness of breath, PND, or orthopnea.  He  has not had any significant syncope.   REVIEW OF SYSTEMS:  Otherwise negative for cough or hemoptysis.  He has not  had any significant fevers.  He has not had any recent trauma.   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  He lives in [place].  He gets his care through  the [facility] here at [facility].   ALLERGIES:  He has no known allergies.   MEDICATIONS:  He has really not been compliant with any of his medications  due to financial reasons.  1. He was supposed to be on hydrochlorothiazide 25 q.d.  2. Lisinopril 20 q.d.  3. Lasix 40 q.d.  4. Glipizide 10 q.d.  5. Lopressor 50 b.i.d.  6. Zocor 40 q.d.   PHYSICAL EXAMINATION:  GENERAL:  Well-developed black male in  no distress.  VITAL SIGNS:  Blood pressure 130/80, pulse 70 and regular.  LUNGS:  Clear.  HEART:  S1 and S2 without murmur, rub, or gallop.  ABDOMEN:  Benign.  EXTREMITIES:  Lower extremities:  Intact pulse.  No edema.   LABORATORIES:  EKG shows no acute changes, sinus rhythm with LVH.  He is  ruled out for myocardial infarction.  His potassium is 3.3.  His creatinine  is 0.9.   IMPRESSION AND RECOMMENDATIONS:  Atypical chest pain in a patient with  presumed cardiomyopathy and noncompliance with medications.  I think it is  reasonable to perform an exercise Cardiolite study on the patient in the  morning.  He has already had a 2-D echocardiogram ordered to reassess  his LV  function.   I suspect medical management and help with financial services to afford his  medications will be all that is needed.   Further recommendations to be based on results of the stress test.                                               [name], M.D. [facility]    PN/MEDQ  D:  08/13/2002  T:  08/13/2002  Job:  [id number]

## 2010-12-31 NOTE — Assessment & Plan Note (Signed)
Charles Hart                                 ON-CALL NOTE   Charles Hart, Charles Hart                      MRN:          [name]  DATE:09/19/2006                            DOB:          11/08/1955    Phone number [phone].  Cardiologist is Dr. [name].   HISTORY:  Charles Hart had a cardiac catheterization today in the [place] lab.  This revealed normal coronaries, normal LV function.  He called the  answering service tonight because he wanted to know what he could take  for headache.  He denies any trouble with his vision, numbness,  tingling, syncope.   PLAN:  I explained to the patient he can take Tylenol for his headache.  He can follow up with Dr. [name] as scheduled.   DISPOSITION:  As above.      Richardson Dopp, PA-C  Electronically Signed      Carlena Bjornstad, MD, [facility]  Electronically Signed   SW/MedQ  DD: 09/19/2006  DT: 09/20/2006  Job #: [id number]

## 2010-12-31 NOTE — Cardiovascular Report (Signed)
[place]  Patient:    Charles Hart, Charles Hart                    MRN: [id number] Proc. Date: 09/06/00 Adm. Date:  [id number] Attending:  Campbell Riches CC:         Lendell Caprice, M.D.  Loretha Brasil. Lia Foyer, M.D. [facility]   Cardiac Catheterization  INDICATIONS:  Cardiomyopathy with Cardiolite study suggesting possibility of previous inferior wall infarction, EF 36%.  Left main coronary artery had a 20% discrete stenosis.  Left anterior descending artery had 20% multiple discrete stenoses in the proximal portion.  Mid and distal vessel were normal.  There was a high intermediate branch with 20 to 30% multiple discrete lesions.  The circumflex coronary artery was normal.  The right coronary artery had a 30% ostial lesion.  Mid and distal vessel were normal.  RAO ventriculography revealed moderate left ventricular cavity enlargement with severe global hypokinesis, the ejection fraction is calculated at 24%. There was only mild MR.  Aortic pressure was equal to 149/103, LV pressure 147/15.  At the end of the case, the patient was given sublingual nitroglycerin and Lasix.  IMPRESSION:  The patient has a nonischemic dilated cardiomyopathy.  Continued medical therapy is warranted.  He will be discharged in the morning so long as his groin heals up and his blood pressure is well controlled. DD:  09/06/00 TD:  09/06/00 Job: [id number]

## 2010-12-31 NOTE — Assessment & Plan Note (Signed)
[place]                            CARDIOLOGY OFFICE NOTE   LATREZ, DORSCHNER                      MRN:          [name]  DATE:10/26/2006                            DOB:          [birth year]    Mr. Kamm returns today for followup.  He has had previous  cardiomyopathy.  He had a recent heart cath without significant disease.   The patient's blood pressure continues to run too high, he has resting  blood pressures in the 160-170 range.  He has been compliant with his  medications.  He is already on fairly good doses of Carvedilol,  lisinopril, Lasix, and amlodipine.   We will start him on clonidine 0.1 b.i.d.  Post cath he had a bit of a  headache and blurry vision, this has passed.  He has seen Dr. Katy Fitch who  took him off his glasses and actually says he has 20/25 vision  bilaterally.  His EF has improved since 2004 where it was 25% - 40%.  His LV function was normal at cath.   EXAMINATION:  The patient is 168/88, pulse is 70 and regular.  HEENT:  Normal, carotids normal without bruit.  LUNGS:  Clear.  Just an S1, S2 with an S4 gallop.  ABDOMEN:  Benign there is no renal bruits, distal pulses are intact with  no edema.   MEDICATIONS:  1. Glipizide 10 a day.  2. Metformin 1 gram a day.  3. Carvedilol 25 b.i.d.  4. Lisinopril 40 a day.  5. Lipitor 40 a day.  6. Amlodipine 10 a day.  7. Lasix 40 a day.  8. Digi 0.25.  9. Actos 45 a day.  10.Protonix 40 a day.  11.Gabapentin 300 a day.   IMPRESSION:  Significant hypertension still suboptimally controlled.  Add clonidine 0.1 mg b.i.d., follow up in 8 weeks.  The patient does  have a blood pressure cuff at home, he will bring it in the next time I  see him so we can correlate it with our manometer.   The patient knows to take his blood pressure 3-4 times a week so I have  record of what he runs at home.  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     Wallis Bamberg. [name], MD, [facility]  Electronically Signed    PCN/MedQ  DD: 10/26/2006  DT: 10/27/2006  Job #: [id number]

## 2010-12-31 NOTE — Discharge Summary (Signed)
[place]. [facility]  Patient:    Charles Hart, Charles Hart                    MRN: [id number] Adm. Date:  [id number] Attending:  Campbell Riches Dictator:   Shelia Media, M.D. CC:         [facility]   Discharge Summary  DISCHARGE DIAGNOSES: 1. Cardiomyopathy with congestive heart failure, recent ejection fraction 29%    with insignificant coronary lesion. 2. Hypertension. 3. Type 2 diabetes mellitus. 4. Hypercholesterolemia. 5. History of heavy alcohol abuse in the 1970s to 1980s, but no alcohol since    1986.  DISCHARGE MEDICATIONS: 1. Lopressor 25 mg p.o. b.i.d. 2. Aspirin 325 mg p.o. q.d. 3. Lotensin 20 mg p.o. q.d. 4. Hydrochlorothiazide 25 mg p.o. q.d. 5. Zocor 40 mg p.o. q.d. 6. Norvasc 10 mg p.o. q.d. 7. Nitroglycerin sublingual p.r.n. chest pain. 8. Glyburide 5 mg p.o. q.d.  PROCEDURES: 1. Cardiolite study dated September 05, 2000, which revealed an inferior scar,    no reversible ischemia, ejection fraction 29%. 2. Coronary catheterization on September 06, 2000, which revealed 20% lesion in    the left main, 20% in the proximal LAD, and a 30% RCA with ejection    fraction of 29% from a dilated cardiomyopathy and global hypokinesis.  CONSULTATION:  [facility].  Charles Hart has been seen by [facility] in the past but was discharged from their practice, and [facility] saw him during this hospital stay.  ADMISSION HISTORY AND PHYSICAL:  Charles Hart is a 55 year old African-American male, with a history of congestive heart failure of unknown cause, presents with a two week history of chest pain.  The pain has been sharp and substernal almost daily for the last two weeks.  The pain is not during exertion.  It only lasts for a few seconds.  There is no radiation, shortness of breath, or palpitations associated with this.  PAST MEDICAL HISTORY:  As above.  He is not  allergic to any medicines.  ADMISSION MEDICATIONS: 1. Lotensin 20 mg p.o. q.d. 2. Hydrochlorothiazide 25 mg p.o. q.d. 3. Glyburide 5 mg p.o. q.d. 4. Aspirin 325 mg p.o. q.d.  FAMILY HISTORY:  Significant for mother who died in her 49s with hypertension, and father who is 36 and in good health.  SOCIAL HISTORY:  Alcohol abuse, as above, stopped in 1986.  No tobacco or illicit drug use.  He is married with six children.  He works in Media planner.  REVIEW OF SYSTEMS:  Noncontributory.  PHYSICAL EXAMINATION:  VITAL SIGNS:  Temperature 98.3, heart rate 83, BP 195/110, respirations 20, 96% saturations on room air.  GENERAL:  He is an obese African-American male in no acute distress.  HEENT:  Pupils, equal, round, reactive to light.  Extraocular movements are intact.  Oropharynx is clear without exudate.  There is no lymphadenopathy or thyromegaly.  CARDIOVASCULAR:  Regular rate and rhythm without murmurs, rubs, or gallops. There is no JVD, no carotid bruit.  LUNGS:  Clear to auscultation bilaterally.  GI:  Positive bowel sounds, soft, nontender, nondistended, no ______ , no mass, obese.  EXTREMITIES:  Without clubbing, cyanosis, or edema.  NEUROLOGIC:  He is awake and oriented x 3, nonfocal.  ADMISSION LABORATORY:  He has a completely normal comprehensive metabolic panel with the exception of a glucose of 186.  His CK, CK-MB, and troponin I ruled out for MI x 3.  His PT and PTT are all within normal limits.  His white count is 5.1.  His hemoglobin is 15.0, and platelets are 271.  His chest x-ray reveals mild cardiomegaly and bibasilar atelectasis with low volume breath but basically unchanged from previous films.  His EKG reveals normal sinus rhythm with LVH by voltage and some repolarization changes which include upsloping ST segments in the anterior leads.  HOSPITAL COURSE: #1 - HISTORY OF CONGESTIVE HEART FAILURE WITH ATYPICAL CHEST PAIN:  Charles Hart was  admitted to telemetry to rule out MI, which he did by negative enzymes x 3.  Cardiology consult was obtained from [facility], who performed a Cardiolite and catheterization with the results as above.  His cardiomyopathy, which originally we presumed to be ischemic, is probably most likely dilated cardiomyopathy secondary to his alcohol abuse.  Since he had no discrete significant stenoses, he did not receive any percutaneous intervention and certainly was not considered for revascularization.  The plan was to maximize his medical therapy which we have done by adding blood pressure medications as above, as well as Zocor for control of his cholesterol, which his LDL was 120 by outpatient records.  We did give some consideration to the addition of Digoxin for symptomatic relief; however, his congestive heart failure really is not symptomatic at this point, and that is something that we could probably consider in the future if he starts developing symptomatic heart failure.  #2 - TYPE 2 DIABETES MELLITUS:  We discontinued him on his home medications, and his CBGs in the hospital were in the 120-150 range.  This is acceptable for now.  We will follow this up as an outpatient.  #3 - POSTCATHETERIZATION:  We kept Charles Hart in-house for one day status post cardiac catheterization to monitor his renal function which was unchanged at the time of discharge as well as to monitor his groin which revealed no bruit or hematoma.  DISPOSITION:  To home.  Charles Hart is instructed to return to ER for chest pain, shortness of breath, nausea, vomiting, groin pain, or bulge in his groin.  He is going to follow up in the outpatient clinic with me on January 28, and he is also going to follow up with Dr. [name] at [facility] on September 25, 2000. DD:  09/07/00 TD:  09/07/00 Job: [id number]

## 2010-12-31 NOTE — Letter (Signed)
September 19, 2006    Department of Transportation   RE:  Charles Hart, Charles Hart  MRN:  [id number]  /  DOB:  [birth year]   To whom it may concern,   [name] is a 54 year old patient of Dr. Bertha Stakes.  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  The patient has a  previous history of nonischemic cardiomyopathy.   The patient had a workup at [facility].  He had a cardiac MRI  which showed normal LV function with mild LVH.  His ejection fraction is  now normal at 71%.   He had an invasive heart catheterization to rule out coronary disease.  His arteries looked fine.  There was no evidence of coronary artery  disease and, again, his ejection fraction was normal by angiography at  65-70%.   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   If you have any questions, do not hesitate to contact me.    Sincerely,     Wallis Bamberg. [name], MD, [facility]  Electronically Signed   PCN/MedQ  DD: 09/19/2006  DT: 09/19/2006  Job #: [id number]   CC:    Jay Schlichter, M.D.

## 2011-01-20 ENCOUNTER — Other Ambulatory Visit: Payer: Self-pay | Admitting: *Deleted

## 2011-01-20 DIAGNOSIS — I509 Heart failure, unspecified: Secondary | ICD-10-CM

## 2011-01-20 DIAGNOSIS — E119 Type 2 diabetes mellitus without complications: Secondary | ICD-10-CM

## 2011-01-20 DIAGNOSIS — I10 Essential (primary) hypertension: Secondary | ICD-10-CM

## 2011-01-20 DIAGNOSIS — E785 Hyperlipidemia, unspecified: Secondary | ICD-10-CM

## 2011-01-20 MED ORDER — PRAVASTATIN SODIUM 40 MG PO TABS: 40.0000 mg | ORAL_TABLET | Freq: Every day | ORAL | Status: DC

## 2011-01-20 MED ORDER — METFORMIN HCL 1000 MG PO TABS: 1000.0000 mg | ORAL_TABLET | Freq: Two times a day (BID) | ORAL | Status: DC

## 2011-01-20 MED ORDER — GABAPENTIN 300 MG PO CAPS: 300.0000 mg | ORAL_CAPSULE | Freq: Every day | ORAL | Status: DC

## 2011-01-20 MED ORDER — DIGOXIN 250 MCG PO TABS: 250.0000 ug | ORAL_TABLET | Freq: Every day | ORAL | Status: DC

## 2011-01-20 MED ORDER — AMLODIPINE BESYLATE 10 MG PO TABS: 10.0000 mg | ORAL_TABLET | Freq: Every day | ORAL | Status: DC

## 2011-01-20 MED ORDER — LISINOPRIL 40 MG PO TABS: 40.0000 mg | ORAL_TABLET | Freq: Every day | ORAL | Status: DC

## 2011-01-20 MED ORDER — CARVEDILOL 25 MG PO TABS: 25.0000 mg | ORAL_TABLET | Freq: Two times a day (BID) | ORAL | Status: DC

## 2011-02-23 ENCOUNTER — Encounter: Payer: Self-pay | Admitting: Internal Medicine

## 2011-02-23 ENCOUNTER — Ambulatory Visit (INDEPENDENT_AMBULATORY_CARE_PROVIDER_SITE_OTHER): Payer: Medicare Other | Admitting: Internal Medicine

## 2011-02-23 VITALS — BP 122/78 | HR 65 | Temp 97.4°F | Ht 69.0 in | Wt 226.8 lb

## 2011-02-23 DIAGNOSIS — E119 Type 2 diabetes mellitus without complications: Secondary | ICD-10-CM

## 2011-02-23 DIAGNOSIS — I10 Essential (primary) hypertension: Secondary | ICD-10-CM

## 2011-02-23 DIAGNOSIS — I509 Heart failure, unspecified: Secondary | ICD-10-CM

## 2011-02-23 DIAGNOSIS — E785 Hyperlipidemia, unspecified: Secondary | ICD-10-CM

## 2011-02-23 MED ORDER — GLUCOSE BLOOD VI STRP: ORAL_STRIP | Status: DC

## 2011-02-23 MED ORDER — ACCU-CHEK FASTCLIX LANCETS MISC: 1.0000 | Freq: Two times a day (BID) | Status: DC

## 2011-02-23 NOTE — Assessment & Plan Note (Signed)
Lipids:    Component Value Date/Time   CHOL 123 05/19/2010 2019   TRIG 257* 05/19/2010 2019   HDL 32* 05/19/2010 2019   LDLCALC 40 05/19/2010 2019   VLDL 51* 05/19/2010 2019   CHOLHDL 3.8 Ratio 05/19/2010 2019   Patient's last LDL was at goal.  He is doing well on his current dose of Pravachol.  Plan is to continue at current dose.

## 2011-02-23 NOTE — Assessment & Plan Note (Signed)
Lab Results  Component Value Date   NA 136 10/28/2010   K 3.8 10/28/2010   CL 99 10/28/2010   CO2 27 10/28/2010   BUN 17 10/28/2010   CREATININE 1.02 10/28/2010   CREATININE 0.86 05/19/2010    BP Readings from Last 3 Encounters:  02/23/11 122/78  11/03/10 138/72  11/03/10 132/77    Assessment: Hypertension control:  controlled  Progress toward goals:  at goal Barriers to meeting goals:  no barriers identified  Plan: Hypertension treatment:  continue current medications

## 2011-02-23 NOTE — Assessment & Plan Note (Signed)
Patient has no lower extremity edema on current dose of Lasix.  Will check a metabolic panel

## 2011-02-23 NOTE — Patient Instructions (Signed)
Take medications as prescribed. Please bring blood glucose meter in for download in 2 weeks. Please schedule a follow-up appointment with Barnabas Harries.

## 2011-02-23 NOTE — Progress Notes (Signed)
  Subjective:    Patient ID: Charles Hart, male    DOB: 1955-09-19, 55 y.o.   MRN: [name]  HPI Patient presents for followup of his diabetes mellitus, hyperlipidemia, hypertension, and other chronic medical problems. He has no acute complaints today.  He does report that he ran out of his Byetta about one month ago and did not have it refilled until this week.  He reports that he saw his ophthalmologist Dr. Katy Fitch about 3 weeks ago.    Review of Systems  Constitutional: Negative for fever, chills and diaphoresis.  Respiratory: Negative for shortness of breath (No dyspnea, orthopnea, or PND.) and wheezing.   Cardiovascular: Negative for chest pain and leg swelling.  Gastrointestinal: Negative for nausea, vomiting, abdominal pain and blood in stool.       Objective:   Physical Exam  Constitutional: No distress.  Cardiovascular: Normal rate, regular rhythm and normal heart sounds.  Exam reveals no gallop and no friction rub.   No murmur heard. Pulmonary/Chest: Effort normal and breath sounds normal. No respiratory distress. He has no wheezes. He has no rales.  Abdominal: Soft. Bowel sounds are normal. He exhibits no distension. There is no tenderness. There is no rebound and no guarding.  Musculoskeletal: He exhibits no edema.          Assessment & Plan:

## 2011-02-23 NOTE — Assessment & Plan Note (Signed)
Lab Results  Component Value Date   HGBA1C 8.6 02/23/2011   HGBA1C 6.3 05/19/2010   CREATININE 1.02 10/28/2010   CREATININE 0.86 05/19/2010   MICROALBUR 18.47* 05/19/2010   MICRALBCREAT 130.8* 05/19/2010   CHOL 123 05/19/2010   HDL 32* 05/19/2010   TRIG 257* 05/19/2010    Last eye exam and foot exam:    Component Value Date/Time   HMDIABEYEEXA mild dm retinopathy OU 08/03/2010    Assessment: Diabetes control: not controlled Progress toward goals: deteriorated Barriers to meeting goals: nonadherence to medications  Plan: Diabetes treatment: continue current medications Patient's diabetes is uncontrolled due to nonadherence with his medications; he was out as Byetta for one month and only recently had it refilled.  I advised him to resume Byetta at previous dose and monitor his blood sugar regularly. Refer to: diabetes educator for self-management training Instruction/counseling given: reminded to get eye exam, reminded to bring blood glucose meter & log to each visit and reminded to bring medications to each visit

## 2011-02-24 LAB — COMPLETE METABOLIC PANEL WITH GFR
Albumin: 4.6 g/dL (ref 3.5–5.2)
Alkaline Phosphatase: 47 U/L (ref 39–117)
BUN: 17 mg/dL (ref 6–23)
CO2: 25 mEq/L (ref 19–32)
Calcium: 9.7 mg/dL (ref 8.4–10.5)
Chloride: 98 mEq/L (ref 96–112)
GFR, Est Non African American: >60 mL/min (ref >60)
Glucose, Bld: 247 mg/dL — ABNORMAL HIGH (ref 70–99)
Potassium: 4.4 mEq/L (ref 3.5–5.3)
Sodium: 137 mEq/L (ref 135–145)
Total Protein: 7.1 g/dL (ref 6.0–8.3)

## 2011-03-14 ENCOUNTER — Telehealth: Payer: Self-pay | Admitting: *Deleted

## 2011-03-14 NOTE — Telephone Encounter (Signed)
Pt called asking for a Rx for diabetic shoes.  Has received in past and wants to get from [facility] in [place].  # [id number]  Ex. 329 He will need: 1. Order from PCP for diabetic shoes and inserts with Dx code and any foot deformities.  2. After that is received they will send out a form to be completed. 3. Next pt will need a face to face office visit for this purpose only and office notes will need to be sent into [facility].    Pt can get new shoes each year, if justified.  Last years pair needs to be warn out. He will bring last years shoes to [facility] for  Pictures to be made.  Pt got last pair 01/27/2010  Would you like to start this process with a Rx for the shoes?

## 2011-04-01 NOTE — Telephone Encounter (Signed)
Given the requirement for a face-to-face encounter, I will need to see him in clinic.  Please call patient and schedule this.

## 2011-04-22 ENCOUNTER — Telehealth: Payer: Self-pay | Admitting: *Deleted

## 2011-04-22 NOTE — Telephone Encounter (Signed)
Spoke with pt and [facility] his Previsit for 04/26/11

## 2011-04-26 ENCOUNTER — Ambulatory Visit (AMBULATORY_SURGERY_CENTER): Payer: Medicare Other

## 2011-04-26 VITALS — Ht 72.0 in | Wt 235.4 lb

## 2011-04-26 DIAGNOSIS — Z860100 Personal history of colon polyps, unspecified: Secondary | ICD-10-CM

## 2011-04-26 DIAGNOSIS — Z800 Family history of malignant neoplasm of digestive organs: Secondary | ICD-10-CM

## 2011-04-26 MED ORDER — NA SULFATE-K SULFATE-MG SULF 17.5-3.13-1.6 GM/177ML PO SOLN: 1.0000 | Freq: Once | ORAL | Status: DC

## 2011-05-02 ENCOUNTER — Other Ambulatory Visit: Payer: Self-pay | Admitting: *Deleted

## 2011-05-02 DIAGNOSIS — I509 Heart failure, unspecified: Secondary | ICD-10-CM

## 2011-05-02 MED ORDER — FUROSEMIDE 40 MG PO TABS: 60.0000 mg | ORAL_TABLET | Freq: Every day | ORAL | Status: DC

## 2011-05-02 MED ORDER — EXENATIDE 10 MCG/0.04ML ~~LOC~~ SOPN: 10.0000 ug | PEN_INJECTOR | Freq: Two times a day (BID) | SUBCUTANEOUS | Status: DC

## 2011-05-02 MED ORDER — CLONIDINE HCL 0.1 MG PO TABS: 0.3000 mg | ORAL_TABLET | Freq: Two times a day (BID) | ORAL | Status: DC

## 2011-05-02 NOTE — Telephone Encounter (Signed)
Clonidine request is for Clonidine .1 mg tablets take 3 tablets twice daily dispense # 540.

## 2011-05-06 ENCOUNTER — Encounter: Payer: Self-pay | Admitting: Gastroenterology

## 2011-05-06 ENCOUNTER — Ambulatory Visit (AMBULATORY_SURGERY_CENTER): Payer: Medicare Other | Admitting: Gastroenterology

## 2011-05-06 VITALS — Temp 98.1°F | Ht 72.0 in | Wt 235.0 lb

## 2011-05-06 DIAGNOSIS — Z1211 Encounter for screening for malignant neoplasm of colon: Secondary | ICD-10-CM

## 2011-05-06 DIAGNOSIS — Z86010 Personal history of colonic polyps: Secondary | ICD-10-CM

## 2011-05-06 DIAGNOSIS — D126 Benign neoplasm of colon, unspecified: Secondary | ICD-10-CM

## 2011-05-06 DIAGNOSIS — K5730 Diverticulosis of large intestine without perforation or abscess without bleeding: Secondary | ICD-10-CM

## 2011-05-06 LAB — GLUCOSE, CAPILLARY: Glucose-Capillary: 229 mg/dL — ABNORMAL HIGH (ref 70–99)

## 2011-05-06 MED ORDER — SODIUM CHLORIDE 0.9 % IV SOLN: 500.0000 mL | INTRAVENOUS | Status: DC

## 2011-05-06 NOTE — Patient Instructions (Signed)
Discharge instructions given with verbal understanding.  Handouts on polyps and diverticulosis given.  Resume previous medications.

## 2011-05-09 ENCOUNTER — Telehealth: Payer: Self-pay

## 2011-05-09 NOTE — Telephone Encounter (Signed)

## 2011-05-11 ENCOUNTER — Ambulatory Visit (INDEPENDENT_AMBULATORY_CARE_PROVIDER_SITE_OTHER): Payer: Medicare Other | Admitting: Internal Medicine

## 2011-05-11 ENCOUNTER — Encounter: Payer: Self-pay | Admitting: Internal Medicine

## 2011-05-11 VITALS — BP 140/78 | HR 71 | Temp 98.1°F | Ht 69.0 in | Wt 233.7 lb

## 2011-05-11 DIAGNOSIS — I428 Other cardiomyopathies: Secondary | ICD-10-CM

## 2011-05-11 DIAGNOSIS — I10 Essential (primary) hypertension: Secondary | ICD-10-CM

## 2011-05-11 DIAGNOSIS — E119 Type 2 diabetes mellitus without complications: Secondary | ICD-10-CM

## 2011-05-11 DIAGNOSIS — Z79899 Other long term (current) drug therapy: Secondary | ICD-10-CM

## 2011-05-11 DIAGNOSIS — L259 Unspecified contact dermatitis, unspecified cause: Secondary | ICD-10-CM

## 2011-05-11 DIAGNOSIS — E785 Hyperlipidemia, unspecified: Secondary | ICD-10-CM

## 2011-05-11 LAB — GLUCOSE, CAPILLARY: Glucose-Capillary: 206 mg/dL — ABNORMAL HIGH (ref 70–99)

## 2011-05-11 MED ORDER — FLUOCINONIDE 0.05 % EX OINT: 1.0000 "application " | TOPICAL_OINTMENT | Freq: Two times a day (BID) | CUTANEOUS | Status: DC

## 2011-05-11 MED ORDER — INSULIN PEN NEEDLE 32G X 4 MM MISC: Status: DC

## 2011-05-11 NOTE — Progress Notes (Signed)
  Subjective:    Patient ID: Charles Hart, male    DOB: 15-Mar-1956, 55 y.o.   MRN: [id number]  HPI Patient returns for followup of his diabetes mellitus, hypertension, and other medical problems.  He has no acute complaint.  He reports that he has been compliant with his medications, although there have been a couple of times when he ran out of his Byetta since his last visit.  He saw a dermatologist in [facility] for the scattered hyperpigmented lesions on his abdomen, and was started on Lidex ointment which improved the lesions although they are still present.  He has followup scheduled with his dermatologist in about a month.  Review of Systems  Respiratory: Negative for shortness of breath.   Cardiovascular: Positive for leg swelling (Improved following the increase in Lasix). Negative for chest pain.  Gastrointestinal: Negative for nausea, vomiting and abdominal pain.  Skin: Positive for rash (Seen by dermatologist, treated with Lidex ointment).      Objective:   Physical Exam  Constitutional: No distress.  Cardiovascular: Normal rate and regular rhythm.  Exam reveals no gallop, no S3 and no S4.   Murmur heard.  Systolic murmur is present with a grade of 2/6  Pulmonary/Chest: Effort normal and breath sounds normal. He has no wheezes. He has no rales.  Musculoskeletal: He exhibits no edema.  Skin:         Assessment & Plan:

## 2011-05-11 NOTE — Assessment & Plan Note (Signed)
Lipids:    Component Value Date/Time   CHOL 123 05/19/2010 2019   TRIG 257* 05/19/2010 2019   HDL 32* 05/19/2010 2019   LDLCALC 40 05/19/2010 2019   VLDL 51* 05/19/2010 2019   CHOLHDL 3.8 Ratio 05/19/2010 2019   Patient is doing well on pravastatin without apparent problems.

## 2011-05-11 NOTE — Assessment & Plan Note (Signed)
I refilled the Lidex ointment and advised patient to follow up with his dermatologist as scheduled.

## 2011-05-11 NOTE — Assessment & Plan Note (Signed)
Patient has no symptoms, and his edema has resolved following the increase in his furosemide to a dose of 60 mg daily.  The plan is to continue current regimen.

## 2011-05-11 NOTE — Assessment & Plan Note (Signed)
Lab Results  Component Value Date   HGBA1C 7.7 05/11/2011    Last eye exam and foot exam:    Component Value Date/Time   HMDIABEYEEXA mild dm retinopathy OU 08/03/2010    Assessment: Diabetes control: not controlled Progress toward goals: improved Barriers to meeting goals: no barriers identified  Plan: Diabetes treatment: continue current medications Refer to: none Instruction/counseling given: reminded to bring blood glucose meter & log to each visit and reminded to bring medications to each visit

## 2011-05-12 LAB — CBC
Hemoglobin: 16.3
MCHC: 33.8
MCV: 83.7
RBC: 5.75
WBC: 7.4

## 2011-05-12 LAB — DIFFERENTIAL
Basophils Relative: 0
Eosinophils Absolute: 0.2
Lymphs Abs: 2.8
Monocytes Absolute: 0.6
Monocytes Relative: 8
Neutrophils Relative %: 51

## 2011-05-12 LAB — POCT I-STAT, CHEM 8
Calcium, Ion: 1.15
Chloride: 101
Creatinine, Ser: 0.8
Glucose, Bld: 397 — ABNORMAL HIGH
Hemoglobin: 17.7 — ABNORMAL HIGH
Potassium: 3.2 — ABNORMAL LOW

## 2011-06-16 ENCOUNTER — Telehealth: Payer: Self-pay | Admitting: Dietician

## 2011-06-16 NOTE — Telephone Encounter (Signed)
Called Charles Hart per request of triage nurse to find out what other documentation they need for patient to receive specail shoes for his diabetes: Charles Hart needs chart notes of  foot exam and frequency of patient self monitoring. I faxed them foot exam dated 05/11/11, but was unable to find documentation as to patient's frequency of self monitoring. Charles Hart says that they can only send him enough for one time a day self monitoring since he is not on  Insulin without documentation of reason for high frequency self monitoring.

## 2011-06-22 ENCOUNTER — Telehealth: Payer: Self-pay | Admitting: Dietician

## 2011-06-22 NOTE — Telephone Encounter (Signed)
Patient calls because he had nausea and a high blood sugar. He was Working on papers for school this morning and did not take Byetta this am, didn't eat and drank a couple cups of coffee with sugar and cream. Felt dizzy and nauseated so checked blood sugar and it was 333 a few minutes ago. This is an unusual reading for him. Last checked his blood sugar yesterday at 7 am with result ~160. Took Metformin this am.    Had patient wash hands and dry thoroughly and recheck his blood sugar. . Reading was 337 conforming first reading was a true reading.   Discussed that nausea likely form metformin on an empty stomach and high blood sugar could be from caffeine effect, sugar and cream effect and not taking Byetta this am. Recommended he settle his stomach with ginger ale, diet soda, broth, water or unsweet tea first. Then take Byetta and eat a low carbohydrate lunch (patient verbalized understanding of this), take walk and check blood sugar at least 2 more times today to be sure it is decreasing. Advised patient not to take Byetta tonight before 6 hours after first injection today. and to call if he has questions, concerns or if blood sugar does not fall back to his usual.

## 2011-06-22 NOTE — Telephone Encounter (Signed)
If patient has further symptoms or elevated blood sugars, I would advise that he be seen in the clinic this week.

## 2011-07-02 ENCOUNTER — Encounter: Payer: Self-pay | Admitting: Internal Medicine

## 2011-07-02 DIAGNOSIS — D126 Benign neoplasm of colon, unspecified: Secondary | ICD-10-CM

## 2011-07-02 HISTORY — DX: Benign neoplasm of colon, unspecified: D12.6

## 2011-07-08 ENCOUNTER — Encounter (HOSPITAL_COMMUNITY): Payer: Self-pay | Admitting: *Deleted

## 2011-07-08 ENCOUNTER — Emergency Department (HOSPITAL_COMMUNITY)
Admission: EM | Admit: 2011-07-08 | Discharge: 2011-07-08 | Disposition: A | Discharge status: Home / Self Care | Payer: Medicare Other | Attending: Emergency Medicine | Admitting: Emergency Medicine

## 2011-07-08 DIAGNOSIS — R238 Other skin changes: Secondary | ICD-10-CM

## 2011-07-08 DIAGNOSIS — M79609 Pain in unspecified limb: Secondary | ICD-10-CM | POA: Insufficient documentation

## 2011-07-08 DIAGNOSIS — Z7982 Long term (current) use of aspirin: Secondary | ICD-10-CM | POA: Insufficient documentation

## 2011-07-08 DIAGNOSIS — Z79899 Other long term (current) drug therapy: Secondary | ICD-10-CM | POA: Insufficient documentation

## 2011-07-08 DIAGNOSIS — E785 Hyperlipidemia, unspecified: Secondary | ICD-10-CM | POA: Insufficient documentation

## 2011-07-08 DIAGNOSIS — E119 Type 2 diabetes mellitus without complications: Secondary | ICD-10-CM | POA: Insufficient documentation

## 2011-07-08 DIAGNOSIS — Z794 Long term (current) use of insulin: Secondary | ICD-10-CM | POA: Insufficient documentation

## 2011-07-08 DIAGNOSIS — I10 Essential (primary) hypertension: Secondary | ICD-10-CM | POA: Insufficient documentation

## 2011-07-08 DIAGNOSIS — I509 Heart failure, unspecified: Secondary | ICD-10-CM | POA: Insufficient documentation

## 2011-07-08 DIAGNOSIS — L988 Other specified disorders of the skin and subcutaneous tissue: Secondary | ICD-10-CM | POA: Insufficient documentation

## 2011-07-08 NOTE — ED Provider Notes (Signed)
Medical screening examination/treatment/procedure(s) were performed by non-physician practitioner and as supervising physician I was immediately available for consultation/collaboration.   Lezlie Octave, MD 07/08/11 2012

## 2011-07-08 NOTE — ED Provider Notes (Signed)
History     CSN: [id number] Arrival date & time: 07/08/2011  2:18 PM   First MD Initiated Contact with Patient 07/08/11 1616      Chief Complaint  Patient presents with  . Foot Pain    (Consider location/radiation/quality/duration/timing/severity/associated sxs/prior treatment) Patient is a 55 y.o. male presenting with lower extremity pain. The history is provided by the patient.  Foot Pain This is a recurrent problem. The current episode started 1 to 4 weeks ago. The problem occurs intermittently. The problem has been gradually improving. The symptoms are aggravated by walking. He has tried nothing for the symptoms.  Small, water-filled blister on instep of left foot.  Past Medical History  Diagnosis Date  . Dermatitis   . CHF (congestive heart failure)     LV function improved from 2004 to 2008.  Historically, moderately dilated LV with EF 30-40% by 2D echo 08/14/2002.  Mild CAD with severe LV dysfunction by cardiac cath 09/2002.  Normal coronary arteries and normal LV function by cardiac cath 09/19/2006.  A 2-D echo on 04/01/2009 showed mild concentric hypertrophy and normal systolic (LVEF  123456) and doppler C/W with grade 1 diastolic dysfunction.  . Hypertension   . Diabetes mellitus   . Hyperlipidemia   . Hearing loss in right ear   . Cardiomyopathy     LV function improved from 2004 to 2008.  Historically, moderately dilated LV with EF 30-40% by 2D echo 08/14/2002.  Mild CAD with severe LV dysfunction by cardiac cath 09/2002.  Normal coronary arteries and normal LV function by cardiac cath 09/19/2006.  A 2-D echo on 04/01/2009 showed mild concentric hypertrophy and normal systolic (LVEF  123456) and doppler C/W with grade 1 diastolic dysfunction.    Past Surgical History  Procedure Date  . Cardiac catheterization     3 times  . Colonoscopy   . Polypectomy     Family History  Problem Relation Age of Onset  . Colon cancer Sister     History  Substance Use Topics  .  Smoking status: Never Smoker   . Smokeless tobacco: Never Used  . Alcohol Use: No      Review of Systems  All other systems reviewed and are negative.    Allergies  Review of patient's allergies indicates no known allergies.  Home Medications   Current Outpatient Rx  Name Route Sig Dispense Refill  . AMLODIPINE BESYLATE 10 MG PO TABS Oral Take 1 tablet (10 mg total) by mouth daily. 90 tablet 3  . ASPIRIN 81 MG PO TABS Oral Take 81 mg by mouth daily.      [name] CARVEDILOL 25 MG PO TABS Oral Take 1 tablet (25 mg total) by mouth 2 (two) times daily with a meal. 180 tablet 3  . CLONIDINE HCL 0.1 MG PO TABS Oral Take 3 tablets (0.3 mg total) by mouth 2 (two) times daily. 540 tablet 1  . DIGOXIN 0.25 MG PO TABS Oral Take 1 tablet (250 mcg total) by mouth daily. 90 tablet 3  . EXENATIDE 10 MCG/0.04ML [place] SOLN Subcutaneous Inject 0.04 mLs (10 mcg total) into the skin 2 (two) times daily with a meal. 2.4 mL 3    THANK YOU  . FLUOCINONIDE 0.05 % EX OINT Topical Apply 1 application topically 2 (two) times daily. To affected area. 30 g 0  . FUROSEMIDE 40 MG PO TABS Oral Take 1.5 tablets (60 mg total) by mouth daily. 135 tablet 1  . GABAPENTIN 300 MG PO CAPS Oral  Take 1 capsule (300 mg total) by mouth daily. 90 capsule 3  . LISINOPRIL 40 MG PO TABS Oral Take 1 tablet (40 mg total) by mouth daily. 90 tablet 3  . METFORMIN HCL 1000 MG PO TABS Oral Take 1 tablet (1,000 mg total) by mouth 2 (two) times daily with a meal. 180 tablet 3  . ONE-DAILY MULTI VITAMINS PO TABS Oral Take 1 tablet by mouth daily.      [name] PRAVASTATIN SODIUM 40 MG PO TABS Oral Take 1 tablet (40 mg total) by mouth daily. 90 tablet 3  . ACCU-CHEK FASTCLIX LANCETS MISC Does not apply 1 each by Does not apply route 2 (two) times daily. Use to check blood sugar as instructed up to 2 times a day.  Dx code 250.00 100 each 11  . GLUCOSE BLOOD VI STRP  Use as instructed to check blood sugar up to 2 times daily.  Dx code 250.00 100 each 12  .  INSULIN PEN NEEDLE 32G X 4 MM MISC  Use as directed for twice daily Byetta injection. 100 each 6    BP 134/73  Pulse 64  Temp(Src) 98 F (36.7 C) (Oral)  Resp 22  SpO2 98%  Physical Exam  Nursing note and vitals reviewed. Constitutional: He is oriented to person, place, and time. He appears well-developed and well-nourished.  HENT:  Head: Normocephalic and atraumatic.  Eyes: Pupils are equal, round, and reactive to light.  Neck: Normal range of motion. Neck supple.  Cardiovascular: Normal rate, regular rhythm and normal heart sounds.   Pulmonary/Chest: Effort normal and breath sounds normal.  Abdominal: Soft. Bowel sounds are normal.  Musculoskeletal: Normal range of motion.       Feet:  Neurological: He is alert and oriented to person, place, and time.  Skin: Skin is warm and dry.  Psychiatric: He has a normal mood and affect.    ED Course  INCISION AND DRAINAGE Performed by: Norman Herrlich Authorized by: Norman Herrlich Consent: Verbal consent obtained. Risks and benefits: risks, benefits and alternatives were discussed Consent given by: patient Patient understanding: patient states understanding of the procedure being performed Patient consent: the patient's understanding of the procedure matches consent given Required items: required blood products, implants, devices, and special equipment available Patient identity confirmed: verbally with patient Type: bulla Body area: lower extremity Patient sedated: no Needle gauge: 22 Incision type: single straight Complexity: simple Drainage: serous Wound treatment: wound left open Patient tolerance: Patient tolerated the procedure well with no immediate complications. Comments: After cleansing site with betadine, a small needle inserted into blister with serous fluid returned.  Pressure reducing dressing applied.   (including critical care time)  Labs Reviewed - No data to display No results found.   No  diagnosis found.    MDM  Friction/pressure blister        Norman Herrlich, NP 07/08/11 1700

## 2011-07-08 NOTE — ED Notes (Signed)
Patient has raised area on the bottom of his left foot.  He states the sx started last week and was smaller in size.

## 2011-07-08 NOTE — ED Notes (Signed)
Pt d/c home. Denies pain Ambulates with a quick steady gait. NAD noted.

## 2011-07-08 NOTE — ED Notes (Signed)
MD at bedside. 

## 2011-07-17 DIAGNOSIS — I428 Other cardiomyopathies: Secondary | ICD-10-CM | POA: Insufficient documentation

## 2011-07-17 DIAGNOSIS — IMO0002 Reserved for concepts with insufficient information to code with codable children: Secondary | ICD-10-CM | POA: Insufficient documentation

## 2011-07-17 DIAGNOSIS — Z794 Long term (current) use of insulin: Secondary | ICD-10-CM | POA: Insufficient documentation

## 2011-07-17 DIAGNOSIS — E119 Type 2 diabetes mellitus without complications: Secondary | ICD-10-CM | POA: Insufficient documentation

## 2011-07-17 DIAGNOSIS — H9190 Unspecified hearing loss, unspecified ear: Secondary | ICD-10-CM | POA: Insufficient documentation

## 2011-07-17 DIAGNOSIS — X58XXXA Exposure to other specified factors, initial encounter: Secondary | ICD-10-CM | POA: Insufficient documentation

## 2011-07-17 DIAGNOSIS — Z7982 Long term (current) use of aspirin: Secondary | ICD-10-CM | POA: Insufficient documentation

## 2011-07-17 DIAGNOSIS — R059 Cough, unspecified: Secondary | ICD-10-CM | POA: Insufficient documentation

## 2011-07-17 DIAGNOSIS — R05 Cough: Secondary | ICD-10-CM | POA: Insufficient documentation

## 2011-07-17 DIAGNOSIS — I10 Essential (primary) hypertension: Secondary | ICD-10-CM | POA: Insufficient documentation

## 2011-07-17 DIAGNOSIS — E785 Hyperlipidemia, unspecified: Secondary | ICD-10-CM | POA: Insufficient documentation

## 2011-07-17 DIAGNOSIS — Z79899 Other long term (current) drug therapy: Secondary | ICD-10-CM | POA: Insufficient documentation

## 2011-07-17 DIAGNOSIS — I509 Heart failure, unspecified: Secondary | ICD-10-CM | POA: Insufficient documentation

## 2011-07-18 ENCOUNTER — Encounter (HOSPITAL_COMMUNITY): Payer: Self-pay

## 2011-07-18 ENCOUNTER — Emergency Department (HOSPITAL_COMMUNITY)
Admission: EM | Admit: 2011-07-18 | Discharge: 2011-07-18 | Disposition: A | Discharge status: Home / Self Care | Payer: Medicare Other | Attending: Emergency Medicine | Admitting: Emergency Medicine

## 2011-07-18 DIAGNOSIS — S90829A Blister (nonthermal), unspecified foot, initial encounter: Secondary | ICD-10-CM

## 2011-07-18 MED ORDER — MUPIROCIN 2 % EX OINT
TOPICAL_OINTMENT | CUTANEOUS | Status: AC
  Filled 2011-07-18: qty 22

## 2011-07-18 MED ORDER — MUPIROCIN CALCIUM 2 % EX CREA
TOPICAL_CREAM | Freq: Once | CUTANEOUS | Status: AC
  Administered 2011-07-18: 04:00:00 via TOPICAL
  Filled 2011-07-18: qty 15

## 2011-07-18 NOTE — ED Notes (Signed)
Pt given discharge instructions, paperwork, pt verbalized understanding.   

## 2011-07-18 NOTE — ED Notes (Signed)
Blister to left foot x 2 weeks, has been seen [facility] for same and "punctured it", has now returned.

## 2011-07-18 NOTE — ED Provider Notes (Addendum)
History     CSN: [id number] Arrival date & time: 07/18/2011 12:52 AM   First MD Initiated Contact with Patient 07/18/11 [phone]      Chief Complaint  Patient presents with  .  blister on sole of left foot     (Consider location/radiation/quality/duration/timing/severity/associated sxs/prior treatment) HPI This is a 55 year old black male with a history of a blister to his left foot, specifically the fluoroscopy. It began about 2 weeks ago. He states he drained it with a needle and it resolved but has recurred over the past several days. He denies pain at this time and has had mild pain with ambulation. He's been wearing a foot drop on that foot to ease pressure on it. He also complains of a chronic cough and asked that his medication may be causing this.  Past Medical History  Diagnosis Date  . Dermatitis   . CHF (congestive heart failure)     LV function improved from 2004 to 2008.  Historically, moderately dilated LV with EF 30-40% by 2D echo 08/14/2002.  Mild CAD with severe LV dysfunction by cardiac cath 09/2002.  Normal coronary arteries and normal LV function by cardiac cath 09/19/2006.  A 2-D echo on 04/01/2009 showed mild concentric hypertrophy and normal systolic (LVEF  123456) and doppler C/W with grade 1 diastolic dysfunction.  . Hypertension   . Diabetes mellitus   . Hyperlipidemia   . Hearing loss in right ear   . Cardiomyopathy     LV function improved from 2004 to 2008.  Historically, moderately dilated LV with EF 30-40% by 2D echo 08/14/2002.  Mild CAD with severe LV dysfunction by cardiac cath 09/2002.  Normal coronary arteries and normal LV function by cardiac cath 09/19/2006.  A 2-D echo on 04/01/2009 showed mild concentric hypertrophy and normal systolic (LVEF  123456) and doppler C/W with grade 1 diastolic dysfunction.    Past Surgical History  Procedure Date  . Cardiac catheterization     3 times  . Colonoscopy   . Polypectomy     Family History  Problem Relation  Age of Onset  . Colon cancer Sister     History  Substance Use Topics  . Smoking status: Never Smoker   . Smokeless tobacco: Never Used  . Alcohol Use: No      Review of Systems  All other systems reviewed and are negative.    Allergies  Review of patient's allergies indicates no known allergies.  Home Medications   Current Outpatient Rx  Name Route Sig Dispense Refill  . ACCU-CHEK FASTCLIX LANCETS MISC Does not apply 1 each by Does not apply route 2 (two) times daily. Use to check blood sugar as instructed up to 2 times a day.  Dx code 250.00 100 each 11  . AMLODIPINE BESYLATE 10 MG PO TABS Oral Take 1 tablet (10 mg total) by mouth daily. 90 tablet 3  . ASPIRIN 81 MG PO TABS Oral Take 81 mg by mouth daily.      [name] CARVEDILOL 25 MG PO TABS Oral Take 1 tablet (25 mg total) by mouth 2 (two) times daily with a meal. 180 tablet 3  . CLONIDINE HCL 0.1 MG PO TABS Oral Take 3 tablets (0.3 mg total) by mouth 2 (two) times daily. 540 tablet 1  . DIGOXIN 0.25 MG PO TABS Oral Take 1 tablet (250 mcg total) by mouth daily. 90 tablet 3  . EXENATIDE 10 MCG/0.04ML [place] SOLN Subcutaneous Inject 0.04 mLs (10 mcg total) into the  skin 2 (two) times daily with a meal. 2.4 mL 3    THANK YOU  . FLUOCINONIDE 0.05 % EX OINT Topical Apply 1 application topically 2 (two) times daily. To affected area. 30 g 0  . FUROSEMIDE 40 MG PO TABS Oral Take 1.5 tablets (60 mg total) by mouth daily. 135 tablet 1  . GABAPENTIN 300 MG PO CAPS Oral Take 1 capsule (300 mg total) by mouth daily. 90 capsule 3  . GLUCOSE BLOOD VI STRP  Use as instructed to check blood sugar up to 2 times daily.  Dx code 250.00 100 each 12  . INSULIN PEN NEEDLE 32G X 4 MM MISC  Use as directed for twice daily Byetta injection. 100 each 6  . LISINOPRIL 40 MG PO TABS Oral Take 1 tablet (40 mg total) by mouth daily. 90 tablet 3  . METFORMIN HCL 1000 MG PO TABS Oral Take 1 tablet (1,000 mg total) by mouth 2 (two) times daily with a meal. 180 tablet  3  . ONE-DAILY MULTI VITAMINS PO TABS Oral Take 1 tablet by mouth daily.      [name] PRAVASTATIN SODIUM 40 MG PO TABS Oral Take 1 tablet (40 mg total) by mouth daily. 90 tablet 3    BP 176/82  Pulse 70  Temp(Src) 98.5 F (36.9 C) (Oral)  Resp 20  Ht 6' (1.829 m)  Wt 232 lb (105.235 kg)  BMI 31.47 kg/m2  SpO2 100%  Physical Exam General: Well-developed, well-nourished male in no acute distress; appearance consistent with age of record HENT: normocephalic, atraumatic; poor dentition Eyes: pupils equal round and reactive to light; extraocular muscles intact; arcus senilis bilaterally Neck: supple Heart: regular rate and rhythm Lungs: clear to auscultation bilaterally Abdomen: soft; nontender; nondistended Extremities: No deformity; full range of motion; pulses normal; trace edema of lower Neurologic: Awake, alert and oriented; motor function intact in all extremities and symmetric; no facial droop Skin: Warm and dry; posterior to left cerebral proximal forefoot with surrounding hyperpigmentation but no erythema, warmth or tenderness Psychiatric: Normal mood and affect    ED Course  Procedures (including critical care time) The blister of the left foot was unroofed with scissors, releasing serous fluid without & revealing fibrinous-appearing material.   MDM          Wynetta Fines, MD 07/18/11 0343  Wynetta Fines, MD 07/18/11 [phone]

## 2011-07-18 NOTE — ED Notes (Signed)
Was seen at [facility] last week. Pt states they drained & covered. Pt states site returned yesterday. Pain when walking. Has appt to see pcp on Tuesday.

## 2011-07-19 ENCOUNTER — Ambulatory Visit (INDEPENDENT_AMBULATORY_CARE_PROVIDER_SITE_OTHER): Payer: Medicare Other | Admitting: Internal Medicine

## 2011-07-19 DIAGNOSIS — E119 Type 2 diabetes mellitus without complications: Secondary | ICD-10-CM

## 2011-07-19 DIAGNOSIS — IMO0002 Reserved for concepts with insufficient information to code with codable children: Secondary | ICD-10-CM

## 2011-07-19 DIAGNOSIS — S90829A Blister (nonthermal), unspecified foot, initial encounter: Secondary | ICD-10-CM | POA: Insufficient documentation

## 2011-07-19 LAB — GLUCOSE, CAPILLARY: Glucose-Capillary: 285 mg/dL — ABNORMAL HIGH (ref 70–99)

## 2011-07-19 NOTE — Patient Instructions (Signed)
1. Keep the wound on your foot clean and dry as best you can.  Use the bandages that I gave you to help pad the area and keep it clean.  2.  We will refer you over to podiatry.  3.  Follow up with your PCP in about 4 weeks.

## 2011-07-19 NOTE — Progress Notes (Signed)
Subjective:   Patient ID: Charles Hart male   DOB: 02-26-1956 55 y.o.   MRN: [id number]  HPI: Charles Hart is a 55 y.o. man with a PMH significant for diabetes, hypertension, CHF, HLD, and diabetic peripheral neuropathy who presents to clinic today for follow up from his ED visit on Saturday.  He had a blister on the in step of his left foot that he first noticed about 3 weeks ago.  He went to the ED and they did a needle drainage.  He states that it was doing well and did not seem infected after that.  He picked up a new pair of diabetic shoes on Thursday 07/14/11 and then on Saturday he noted that the blister reappeared.  He presented to the ED at [facility] and they unroofed the blister and bandaged it.  He was told to follow up with his PCP.    He states that he has been taking all of his medications as directed and denies any side effects.    Past Medical History  Diagnosis Date  . Dermatitis   . CHF (congestive heart failure)     LV function improved from 2004 to 2008.  Historically, moderately dilated LV with EF 30-40% by 2D echo 08/14/2002.  Mild CAD with severe LV dysfunction by cardiac cath 09/2002.  Normal coronary arteries and normal LV function by cardiac cath 09/19/2006.  A 2-D echo on 04/01/2009 showed mild concentric hypertrophy and normal systolic (LVEF  123456) and doppler C/W with grade 1 diastolic dysfunction.  . Hypertension   . Diabetes mellitus   . Hyperlipidemia   . Hearing loss in right ear   . Cardiomyopathy     LV function improved from 2004 to 2008.  Historically, moderately dilated LV with EF 30-40% by 2D echo 08/14/2002.  Mild CAD with severe LV dysfunction by cardiac cath 09/2002.  Normal coronary arteries and normal LV function by cardiac cath 09/19/2006.  A 2-D echo on 04/01/2009 showed mild concentric hypertrophy and normal systolic (LVEF  123456) and doppler C/W with grade 1 diastolic dysfunction.   Current Outpatient Prescriptions  Medication Sig  Dispense Refill  . ACCU-CHEK FASTCLIX LANCETS MISC 1 each by Does not apply route 2 (two) times daily. Use to check blood sugar as instructed up to 2 times a day.  Dx code 250.00  100 each  11  . amLODipine (NORVASC) 10 MG tablet Take 1 tablet (10 mg total) by mouth daily.  90 tablet  3  . aspirin 81 MG tablet Take 81 mg by mouth daily.        . carvedilol (COREG) 25 MG tablet Take 1 tablet (25 mg total) by mouth 2 (two) times daily with a meal.  180 tablet  3  . cloNIDine (CATAPRES) 0.1 MG tablet Take 3 tablets (0.3 mg total) by mouth 2 (two) times daily.  540 tablet  1  . digoxin (LANOXIN) 0.25 MG tablet Take 1 tablet (250 mcg total) by mouth daily.  90 tablet  3  . exenatide (BYETTA 10 MCG PEN) 10 MCG/0.04ML SOLN Inject 0.04 mLs (10 mcg total) into the skin 2 (two) times daily with a meal.  2.4 mL  3  . fluocinonide (LIDEX) 0.05 % ointment Apply 1 application topically 2 (two) times daily. To affected area.  30 g  0  . furosemide (LASIX) 40 MG tablet Take 1.5 tablets (60 mg total) by mouth daily.  135 tablet  1  . gabapentin (NEURONTIN) 300 MG  capsule Take 1 capsule (300 mg total) by mouth daily.  90 capsule  3  . glucose blood (ACCU-CHEK SMARTVIEW) test strip Use as instructed to check blood sugar up to 2 times daily.  Dx code 250.00  100 each  12  . Insulin Pen Needle (BD PEN NEEDLE NANO U/F) 32G X 4 MM MISC Use as directed for twice daily [place] injection.  100 each  6  . lisinopril (PRINIVIL,ZESTRIL) 40 MG tablet Take 1 tablet (40 mg total) by mouth daily.  90 tablet  3  . metFORMIN (GLUCOPHAGE) 1000 MG tablet Take 1 tablet (1,000 mg total) by mouth 2 (two) times daily with a meal.  180 tablet  3  . Multiple Vitamin (MULTIVITAMIN) tablet Take 1 tablet by mouth daily.        . pravastatin (PRAVACHOL) 40 MG tablet Take 1 tablet (40 mg total) by mouth daily.  90 tablet  3   Family History  Problem Relation Age of Onset  . Colon cancer Sister    History   Social History  . Marital Status:  Married    Spouse Name: N/A    Number of Children: N/A  . Years of Education: N/A   Social History Main Topics  . Smoking status: Never Smoker   . Smokeless tobacco: Never Used  . Alcohol Use: No  . Drug Use: No  . Sexually Active: Not on file   Other Topics Concern  . Not on file   Social History Narrative  . No narrative on file   Review of Systems: Constitutional: Denies fever, chills, diaphoresis, appetite change and fatigue.  HEENT: Denies photophobia, eye pain, redness, hearing loss, ear pain, congestion, sore throat, rhinorrhea, sneezing, mouth sores, trouble swallowing, neck pain, neck stiffness and tinnitus.   Respiratory: Denies SOB, DOE, cough, chest tightness,  and wheezing.   Cardiovascular: Denies chest pain, palpitations and leg swelling.  Gastrointestinal: Denies nausea, vomiting, abdominal pain, diarrhea, constipation, blood in stool and abdominal distention.  Genitourinary: Denies dysuria, urgency, frequency, hematuria, flank pain and difficulty urinating.  Musculoskeletal: Denies myalgias, back pain, joint swelling, arthralgias and gait problem.  Skin: Denies pallor, rash and wound.  Neurological: Denies dizziness, seizures, syncope, weakness, light-headedness, numbness and headaches.  Hematological: Denies adenopathy. Easy bruising, personal or family bleeding history  Psychiatric/Behavioral: Denies suicidal ideation, mood changes, confusion, nervousness, sleep disturbance and agitation  Objective:  Physical Exam: Filed Vitals:   07/19/11 1507  BP: 171/87  Pulse: 75  Temp: 97.4 F (36.3 C)  TempSrc: Oral  Height: 6' (1.829 m)  Weight: 230 lb 1.6 oz (104.373 kg)   Constitutional: Vital signs reviewed.  Patient is a well-developed and well-nourished man in no acute distress and cooperative with exam. Alert and oriented x3.  Head: Normocephalic and atraumatic Ear: TM normal bilaterally Mouth: no erythema or exudates, MMM Eyes: PERRL, EOMI, conjunctivae  normal, No scleral icterus.  Neck: Supple, Trachea midline normal ROM, No JVD, mass, thyromegaly, or carotid bruit present.  Cardiovascular: RRR, S1 normal, S2 normal, no MRG, pulses symmetric and intact bilaterally Pulmonary/Chest: CTAB, no wheezes, rales, or rhonchi Abdominal: Soft. Non-tender, non-distended, bowel sounds are normal, no masses, organomegaly, or guarding present.  GU: no CVA tenderness Musculoskeletal: No joint deformities, erythema, or stiffness, ROM full and no nontender Hematology: no cervical, inginal, or axillary adenopathy.  Neurological: A&O x3, Strenght is normal and symmetric bilaterally, cranial nerve II-XII are grossly intact, no focal motor deficit, sensory intact to light touch bilaterally.   Skin: on  the in step of the left foot there is a 1 cm circle of an unroofed blister with a white base.  No surrounding erythema or warmth.  Tiny amount of serous material was able to be expressed with compression.  No undermining noted.    Psychiatric: Normal mood and affect. speech and behavior is normal. Judgment and thought content normal. Cognition and memory are normal.   Assessment & Plan:

## 2011-07-20 NOTE — Assessment & Plan Note (Signed)
Lab Results  Component Value Date   HGBA1C 7.7 05/11/2011   HGBA1C 8.6 02/23/2011   HGBA1C 6.3 10/28/2010   Lab Results  Component Value Date   MICROALBUR 18.47* 05/19/2010   LDLCALC 40 05/19/2010   CREATININE 0.97 02/23/2011   His A1C has improved but has been better in the past.  He was encouraged continue his medications and to work on diet and exercise to improve his control.  He will follow up with his PCP.

## 2011-07-20 NOTE — Assessment & Plan Note (Signed)
He has this open wound now on his foot after the unroofing in the ED.  He will need close follow up while this heals to ensure that it does not become infected.  I cleaned the wound with Chlorahexidine preparation and placed a hypercolloid dressing on it.  He was given a few extra dressings and a referral to podiatry to follow this. I also suggested that he return to [facility] to have see if there are any other padding they can put in the shoe to keep this from happening.

## 2011-08-20 ENCOUNTER — Emergency Department (HOSPITAL_COMMUNITY)
Admission: EM | Admit: 2011-08-20 | Discharge: 2011-08-21 | Disposition: A | Discharge status: Home / Self Care | Payer: Medicare Other | Attending: Emergency Medicine | Admitting: Emergency Medicine

## 2011-08-20 ENCOUNTER — Encounter (HOSPITAL_COMMUNITY): Payer: Self-pay

## 2011-08-20 DIAGNOSIS — E785 Hyperlipidemia, unspecified: Secondary | ICD-10-CM | POA: Insufficient documentation

## 2011-08-20 DIAGNOSIS — I509 Heart failure, unspecified: Secondary | ICD-10-CM | POA: Insufficient documentation

## 2011-08-20 DIAGNOSIS — E119 Type 2 diabetes mellitus without complications: Secondary | ICD-10-CM | POA: Insufficient documentation

## 2011-08-20 DIAGNOSIS — Z79899 Other long term (current) drug therapy: Secondary | ICD-10-CM | POA: Insufficient documentation

## 2011-08-20 DIAGNOSIS — I10 Essential (primary) hypertension: Secondary | ICD-10-CM | POA: Insufficient documentation

## 2011-08-20 DIAGNOSIS — R21 Rash and other nonspecific skin eruption: Secondary | ICD-10-CM | POA: Insufficient documentation

## 2011-08-20 MED ORDER — AMLODIPINE BESYLATE 5 MG PO TABS
10.0000 mg | ORAL_TABLET | Freq: Once | ORAL | Status: AC
  Administered 2011-08-20: 10 mg via ORAL
  Filled 2011-08-20: qty 2

## 2011-08-20 MED ORDER — FLUOCINONIDE 0.1 % EX CREA: TOPICAL_CREAM | CUTANEOUS | Status: DC

## 2011-08-20 MED ORDER — CLONIDINE HCL 0.1 MG/24HR TD PTWK
0.2000 mg | MEDICATED_PATCH | Freq: Once | TRANSDERMAL | Status: DC
  Administered 2011-08-20: 0.2 mg via TRANSDERMAL
  Filled 2011-08-20: qty 2

## 2011-08-20 MED ORDER — CLONIDINE HCL 0.1 MG PO TABS
0.1000 mg | ORAL_TABLET | Freq: Once | ORAL | Status: AC
  Administered 2011-08-20: 0.1 mg via ORAL
  Filled 2011-08-20: qty 1

## 2011-08-20 MED ORDER — LISINOPRIL 40 MG PO TABS
40.0000 mg | ORAL_TABLET | Freq: Once | ORAL | Status: AC
  Administered 2011-08-20: 40 mg via ORAL
  Filled 2011-08-20: qty 1

## 2011-08-20 MED ORDER — AMLODIPINE BESYLATE 10 MG PO TABS: 10.0000 mg | ORAL_TABLET | Freq: Every day | ORAL | Status: DC

## 2011-08-20 MED ORDER — MUPIROCIN CALCIUM 2 % EX CREA: TOPICAL_CREAM | Freq: Three times a day (TID) | CUTANEOUS | Status: AC

## 2011-08-20 MED ORDER — CLONIDINE HCL 0.1 MG PO TABS: 0.3000 mg | ORAL_TABLET | Freq: Two times a day (BID) | ORAL | Status: DC

## 2011-08-20 MED ORDER — LISINOPRIL 40 MG PO TABS: 40.0000 mg | ORAL_TABLET | Freq: Every day | ORAL | Status: DC

## 2011-08-20 NOTE — ED Notes (Signed)
Received report agree with previous assessment

## 2011-08-20 NOTE — ED Provider Notes (Signed)
History     CSN: [id number]  Arrival date & time 08/20/11  2036   First MD Initiated Contact with Patient 08/20/11 2056      Chief Complaint  Patient presents with  . Rash    (Consider location/radiation/quality/duration/timing/severity/associated sxs/prior treatment) HPI The patient presents with a chief complaint of a rash on the right pinky and left thumb for the last 3 weeks. Patient states he has history of having a rash around the nipples. He saw a dermatologist and biopsy and subsequently was diagnosed with eczema. He states he gets them other patches in the past on his back. He has been trying steroid creams without success. Patient came to the emergency room for evaluation of this. Incidentally he did note that he has not taken any of his blood pressure medications the last week. Patient denies any chest pain ,shortness of breath, headache, dizziness or other complaints Past Medical History  Diagnosis Date  . Dermatitis   . CHF (congestive heart failure)     LV function improved from 2004 to 2008.  Historically, moderately dilated LV with EF 30-40% by 2D echo 08/14/2002.  Mild CAD with severe LV dysfunction by cardiac cath 09/2002.  Normal coronary arteries and normal LV function by cardiac cath 09/19/2006.  A 2-D echo on 04/01/2009 showed mild concentric hypertrophy and normal systolic (LVEF  123456) and doppler C/W with grade 1 diastolic dysfunction.  . Hypertension   . Diabetes mellitus   . Hyperlipidemia   . Hearing loss in right ear   . Cardiomyopathy     LV function improved from 2004 to 2008.  Historically, moderately dilated LV with EF 30-40% by 2D echo 08/14/2002.  Mild CAD with severe LV dysfunction by cardiac cath 09/2002.  Normal coronary arteries and normal LV function by cardiac cath 09/19/2006.  A 2-D echo on 04/01/2009 showed mild concentric hypertrophy and normal systolic (LVEF  123456) and doppler C/W with grade 1 diastolic dysfunction.    Past Surgical History    Procedure Date  . Cardiac catheterization     3 times  . Colonoscopy   . Polypectomy     Family History  Problem Relation Age of Onset  . Colon cancer Sister     History  Substance Use Topics  . Smoking status: Never Smoker   . Smokeless tobacco: Never Used  . Alcohol Use: No      Review of Systems  Constitutional: Negative for fever and fatigue.  All other systems reviewed and are negative.    Allergies  Review of patient's allergies indicates no known allergies.  Home Medications   Current Outpatient Rx  Name Route Sig Dispense Refill  . ACCU-CHEK FASTCLIX LANCETS MISC Does not apply 1 each by Does not apply route 2 (two) times daily. Use to check blood sugar as instructed up to 2 times a day.  Dx code 250.00 100 each 11  . AMLODIPINE BESYLATE 10 MG PO TABS Oral Take 1 tablet (10 mg total) by mouth daily. 90 tablet 3  . ASPIRIN 81 MG PO TABS Oral Take 81 mg by mouth daily.      [name] CARVEDILOL 25 MG PO TABS Oral Take 1 tablet (25 mg total) by mouth 2 (two) times daily with a meal. 180 tablet 3  . CLONIDINE HCL 0.1 MG PO TABS Oral Take 3 tablets (0.3 mg total) by mouth 2 (two) times daily. 540 tablet 1  . DIGOXIN 0.25 MG PO TABS Oral Take 1 tablet (250 mcg total)  by mouth daily. 90 tablet 3  . EXENATIDE 10 MCG/0.04ML [place] SOLN Subcutaneous Inject 0.04 mLs (10 mcg total) into the skin 2 (two) times daily with a meal. 2.4 mL 3    THANK YOU  . FLUOCINONIDE 0.05 % EX OINT Topical Apply 1 application topically 2 (two) times daily. To affected area. 30 g 0  . FUROSEMIDE 40 MG PO TABS Oral Take 1.5 tablets (60 mg total) by mouth daily. 135 tablet 1  . GABAPENTIN 300 MG PO CAPS Oral Take 1 capsule (300 mg total) by mouth daily. 90 capsule 3  . GLUCOSE BLOOD VI STRP  Use as instructed to check blood sugar up to 2 times daily.  Dx code 250.00 100 each 12  . INSULIN PEN NEEDLE 32G X 4 MM MISC  Use as directed for twice daily Byetta injection. 100 each 6  . LISINOPRIL 40 MG PO TABS  Oral Take 1 tablet (40 mg total) by mouth daily. 90 tablet 3  . METFORMIN HCL 1000 MG PO TABS Oral Take 1 tablet (1,000 mg total) by mouth 2 (two) times daily with a meal. 180 tablet 3  . ONE-DAILY MULTI VITAMINS PO TABS Oral Take 1 tablet by mouth daily.      [name] PRAVASTATIN SODIUM 40 MG PO TABS Oral Take 1 tablet (40 mg total) by mouth daily. 90 tablet 3    BP 212/115  Pulse 106  Temp(Src) 98.3 F (36.8 C) (Oral)  Resp 24  Ht 6' (1.829 m)  Wt 230 lb (104.327 kg)  BMI 31.19 kg/m2  SpO2 100%  Physical Exam  Nursing note and vitals reviewed. Constitutional: He appears well-developed and well-nourished. No distress.  HENT:  Head: Normocephalic and atraumatic.  Right Ear: External ear normal.  Left Ear: External ear normal.  Eyes: Conjunctivae are normal. Right eye exhibits no discharge. Left eye exhibits no discharge. No scleral icterus.  Neck: Neck supple. No tracheal deviation present.  Cardiovascular: Normal rate, regular rhythm and intact distal pulses.   Pulmonary/Chest: Effort normal and breath sounds normal. No stridor. No respiratory distress. He has no wheezes. He has no rales.  Abdominal: Soft. Bowel sounds are normal. He exhibits no distension. There is no tenderness. There is no rebound and no guarding.  Musculoskeletal: He exhibits no edema and no tenderness.  Neurological: He is alert. He has normal strength. No sensory deficit. Cranial nerve deficit:  no gross defecits noted. He exhibits normal muscle tone. He displays no seizure activity. Coordination normal.  Skin: Skin is warm and dry. Rash noted.       Scaling lichenified type rash on the fingertips of the digits described proximal to the nailbed, some serous oozing within the areas  Psychiatric: He has a normal mood and affect.    ED Course  Procedures (including critical care time)  Labs Reviewed - No data to display No results found.   1. Hypertension   2. Rash   3. HYPERTENSION       MDM  Regarding  the patient's rash, and encouraged him to follow up with his dermatologist. I will try him on ice drawn burst steroid hormone and will also prescribe some antibiotic ointment because there could be a component of a bacterial superinfection. The patient was given his blood pressure medications.  He does not appear to be having any evidence of end organ ischemia or acute complications associated with hypertension. Patient encouraged to take his blood pressure medications in the future.  Dr Olin Hauser will recheck  pt after he has been given his BP medications        Kathalene Frames, MD 08/20/11 2156

## 2011-08-20 NOTE — ED Notes (Signed)
AC called for Lisinopril.

## 2011-08-20 NOTE — ED Notes (Signed)
Pt acuity increased to level 3 due to BP. Pt to main ED.

## 2011-08-20 NOTE — ED Notes (Signed)
Took patients BP was elevated to 233/119 made Dr. Olin Hauser aware.

## 2011-08-20 NOTE — ED Notes (Signed)
Pt presents with dry open areas to right pinky and left thumb x 3 weeks.

## 2011-08-21 ENCOUNTER — Encounter (HOSPITAL_COMMUNITY): Payer: Self-pay | Admitting: *Deleted

## 2011-08-21 ENCOUNTER — Emergency Department (HOSPITAL_COMMUNITY)
Admission: EM | Admit: 2011-08-21 | Discharge: 2011-08-21 | Disposition: A | Discharge status: Home / Self Care | Payer: Medicare Other | Attending: Emergency Medicine | Admitting: Emergency Medicine

## 2011-08-21 DIAGNOSIS — E119 Type 2 diabetes mellitus without complications: Secondary | ICD-10-CM | POA: Insufficient documentation

## 2011-08-21 DIAGNOSIS — I10 Essential (primary) hypertension: Secondary | ICD-10-CM

## 2011-08-21 DIAGNOSIS — E785 Hyperlipidemia, unspecified: Secondary | ICD-10-CM | POA: Insufficient documentation

## 2011-08-21 DIAGNOSIS — Z79899 Other long term (current) drug therapy: Secondary | ICD-10-CM | POA: Insufficient documentation

## 2011-08-21 DIAGNOSIS — Z7982 Long term (current) use of aspirin: Secondary | ICD-10-CM | POA: Insufficient documentation

## 2011-08-21 DIAGNOSIS — I509 Heart failure, unspecified: Secondary | ICD-10-CM | POA: Insufficient documentation

## 2011-08-21 DIAGNOSIS — Z794 Long term (current) use of insulin: Secondary | ICD-10-CM | POA: Insufficient documentation

## 2011-08-21 DIAGNOSIS — R51 Headache: Secondary | ICD-10-CM | POA: Insufficient documentation

## 2011-08-21 LAB — BASIC METABOLIC PANEL
CO2: 30 mEq/L (ref 19–32)
Chloride: 97 mEq/L (ref 96–112)
Glucose, Bld: 358 mg/dL — ABNORMAL HIGH (ref 70–99)
Sodium: 135 mEq/L (ref 135–145)

## 2011-08-21 MED ORDER — CLONIDINE HCL 0.2 MG PO TABS
0.3000 mg | ORAL_TABLET | Freq: Once | ORAL | Status: AC
  Administered 2011-08-21: 0.3 mg via ORAL
  Filled 2011-08-21: qty 1

## 2011-08-21 MED ORDER — METFORMIN HCL 1000 MG PO TABS: 1000.0000 mg | ORAL_TABLET | Freq: Two times a day (BID) | ORAL | Status: DC

## 2011-08-21 NOTE — ED Notes (Signed)
edp notified of clonidine patch applied to right arm last pm, per edp leave patch applied.

## 2011-08-21 NOTE — ED Notes (Signed)
Pt states he was seen and treated last pm. Pt states his bp was high at last visit and remains high today.

## 2011-08-21 NOTE — ED Provider Notes (Signed)
History  This chart was scribed for Maudry Diego, MD by Jenne Campus. This patient was seen in room APA12/[facility]12 and the patient's care was started at 7:32AM.  CSN: [id number]  Arrival date & time 08/21/11  0700   First MD Initiated Contact with Patient 08/21/11 [phone]      Chief Complaint  Patient presents with  . Hypertension    Patient is a 56 y.o. male presenting with hypertension. The history is provided by the patient. No language interpreter was used.  Hypertension This is a new problem. The current episode started yesterday. The problem occurs constantly. The problem has been gradually worsening. Associated symptoms include headaches. Pertinent negatives include no chest pain, no abdominal pain and no shortness of breath. The symptoms are aggravated by nothing. The symptoms are relieved by nothing. He has tried nothing for the symptoms.    ANIRUDDHA ROTHMEYER is a 56 y.o. male with a h/o HTN who presents to the Emergency Department complaining of 2 days of HTN with associated HA located across the eye orbitals described as a pressure feeling. Pt denies modifying factors and has not taken any medication to improve his symptoms. Pt states that he has had previous episodes of similar symptoms several years ago. Pt was seen here last night for similar symptoms and was discharged home with a prescription for his HTN medication. Pt  reports that he has missed 1.5 weeks of HTN medication, because he states that he doesn't have the money to pay for the prescription. Pt also has a h/o CHF, diabetes and hyperlipidemia. Pt denies smoking and alcohol use.  Past Medical History  Diagnosis Date  . Dermatitis   . CHF (congestive heart failure)     LV function improved from 2004 to 2008.  Historically, moderately dilated LV with EF 30-40% by 2D echo 08/14/2002.  Mild CAD with severe LV dysfunction by cardiac cath 09/2002.  Normal coronary arteries and normal LV function by cardiac cath 09/19/2006.  A  2-D echo on 04/01/2009 showed mild concentric hypertrophy and normal systolic (LVEF  123456) and doppler C/W with grade 1 diastolic dysfunction.  . Hypertension   . Diabetes mellitus   . Hyperlipidemia   . Hearing loss in right ear   . Cardiomyopathy     LV function improved from 2004 to 2008.  Historically, moderately dilated LV with EF 30-40% by 2D echo 08/14/2002.  Mild CAD with severe LV dysfunction by cardiac cath 09/2002.  Normal coronary arteries and normal LV function by cardiac cath 09/19/2006.  A 2-D echo on 04/01/2009 showed mild concentric hypertrophy and normal systolic (LVEF  123456) and doppler C/W with grade 1 diastolic dysfunction.    Past Surgical History  Procedure Date  . Cardiac catheterization     3 times  . Colonoscopy   . Polypectomy     Family History  Problem Relation Age of Onset  . Colon cancer Sister     History  Substance Use Topics  . Smoking status: Never Smoker   . Smokeless tobacco: Never Used  . Alcohol Use: No      Review of Systems  Constitutional: Negative for fever and fatigue.  HENT: Negative for congestion, sinus pressure and ear discharge.   Eyes: Negative for photophobia and discharge.  Respiratory: Negative for cough and shortness of breath.   Cardiovascular: Negative for chest pain.  Gastrointestinal: Negative for abdominal pain and diarrhea.  Genitourinary: Negative for frequency and hematuria.  Musculoskeletal: Negative for back pain and joint  swelling.  Skin: Negative for rash.  Neurological: Positive for headaches. Negative for seizures.  Hematological: Negative.   Psychiatric/Behavioral: Negative for hallucinations.    Allergies  Review of patient's allergies indicates no known allergies.  Home Medications   Current Outpatient Rx  Name Route Sig Dispense Refill  . ACCU-CHEK FASTCLIX LANCETS MISC Does not apply 1 each by Does not apply route 2 (two) times daily. Use to check blood sugar as instructed up to 2 times a  day.  Dx code 250.00 100 each 11  . AMLODIPINE BESYLATE 10 MG PO TABS Oral Take 1 tablet (10 mg total) by mouth daily. 90 tablet 3  . ASPIRIN EC 81 MG PO TBEC Oral Take 81 mg by mouth daily.      [name] CARVEDILOL 25 MG PO TABS Oral Take 1 tablet (25 mg total) by mouth 2 (two) times daily with a meal. 180 tablet 3  . CLONIDINE HCL 0.1 MG PO TABS Oral Take 3 tablets (0.3 mg total) by mouth 2 (two) times daily. 540 tablet 1  . DIGOXIN 0.25 MG PO TABS Oral Take 1 tablet (250 mcg total) by mouth daily. 90 tablet 3  . EXENATIDE 10 MCG/0.04ML [place] SOLN Subcutaneous Inject 0.04 mLs (10 mcg total) into the skin 2 (two) times daily with a meal. 2.4 mL 3    THANK YOU  . FLUOCINONIDE 0.1 % EX CREA  Apply to affected area three times daily 30 g 0  . FUROSEMIDE 40 MG PO TABS Oral Take 1.5 tablets (60 mg total) by mouth daily. 135 tablet 1  . GABAPENTIN 300 MG PO CAPS Oral Take 1 capsule (300 mg total) by mouth daily. 90 capsule 3  . GLUCOSE BLOOD VI STRP  Use as instructed to check blood sugar up to 2 times daily.  Dx code 250.00 100 each 12  . INSULIN PEN NEEDLE 32G X 4 MM MISC  Use as directed for twice daily [place] injection. 100 each 6  . LISINOPRIL 40 MG PO TABS Oral Take 1 tablet (40 mg total) by mouth daily. 90 tablet 3  . METFORMIN HCL 1000 MG PO TABS Oral Take 1 tablet (1,000 mg total) by mouth 2 (two) times daily with a meal. 180 tablet 3  . ONE-DAILY MULTI VITAMINS PO TABS Oral Take 1 tablet by mouth daily.      [name] MUPIROCIN CALCIUM 2 % EX CREA Topical Apply topically 3 (three) times daily. 15 g 0  . PRAVASTATIN SODIUM 40 MG PO TABS Oral Take 1 tablet (40 mg total) by mouth daily. 90 tablet 3    Triage Vitals: BP 206/124  Pulse 98  Temp(Src) 98.4 F (36.9 C) (Oral)  Ht 6' (1.829 m)  Wt 230 lb (104.327 kg)  BMI 31.19 kg/m2  SpO2 96%  Physical Exam  Nursing note and vitals reviewed. Constitutional: He is oriented to person, place, and time. He appears well-developed and well-nourished.  HENT:    Head: Normocephalic and atraumatic.  Eyes: Conjunctivae and EOM are normal. No scleral icterus.  Neck: Neck supple. No thyromegaly present.  Cardiovascular: Normal rate and regular rhythm.  Exam reveals no gallop and no friction rub.   No murmur heard. Pulmonary/Chest: Effort normal and breath sounds normal. No stridor. He has no wheezes. He has no rales. He exhibits no tenderness.  Abdominal: Soft. He exhibits no distension. There is no tenderness. There is no rebound.  Musculoskeletal: Normal range of motion. He exhibits no edema.  Lymphadenopathy:    He  has no cervical adenopathy.  Neurological: He is alert and oriented to person, place, and time. Coordination normal.  Skin: Skin is warm and dry. No rash noted. No erythema.  Psychiatric: He has a normal mood and affect. His behavior is normal.    ED Course  Procedures (including critical care time)  DIAGNOSTIC STUDIES: Oxygen Saturation is 96% on room air, normal by my interpretation.    COORDINATION OF CARE: 7:35AM-Discussed treatment plan with pt at bedside and pt agreed to plan. 9:55AM-Pt rechecked and has improved. BP 164/95. Advised wife to fill prescriptions for BP and diabetes. Informed pt of risk factors if he doesn't get them filled.   Labs Reviewed  BASIC METABOLIC PANEL - Abnormal; Notable for the following:    Glucose, Bld 358 (*)    All other components within normal limits   No results found.   No diagnosis found.    MDM        The chart was scribed for me under my direct supervision.  I personally performed the history, physical, and medical decision making and all procedures in the evaluation of this patient.Maudry Diego, MD 08/21/11 1022

## 2011-08-21 NOTE — ED Notes (Signed)
Given water per pt request.  Delay explained.  Pt verbalized understanding.  nad noted.

## 2011-08-23 ENCOUNTER — Other Ambulatory Visit: Payer: Self-pay

## 2011-08-23 ENCOUNTER — Emergency Department (HOSPITAL_COMMUNITY): Payer: Medicare Other

## 2011-08-23 ENCOUNTER — Emergency Department (HOSPITAL_COMMUNITY)
Admission: EM | Admit: 2011-08-23 | Discharge: 2011-08-23 | Disposition: A | Discharge status: Home / Self Care | Payer: Medicare Other | Attending: Emergency Medicine | Admitting: Emergency Medicine

## 2011-08-23 ENCOUNTER — Encounter (HOSPITAL_COMMUNITY): Payer: Self-pay | Admitting: *Deleted

## 2011-08-23 DIAGNOSIS — R0989 Other specified symptoms and signs involving the circulatory and respiratory systems: Secondary | ICD-10-CM | POA: Insufficient documentation

## 2011-08-23 DIAGNOSIS — I10 Essential (primary) hypertension: Secondary | ICD-10-CM | POA: Insufficient documentation

## 2011-08-23 DIAGNOSIS — I509 Heart failure, unspecified: Secondary | ICD-10-CM | POA: Insufficient documentation

## 2011-08-23 DIAGNOSIS — R0602 Shortness of breath: Secondary | ICD-10-CM | POA: Insufficient documentation

## 2011-08-23 DIAGNOSIS — R0609 Other forms of dyspnea: Secondary | ICD-10-CM | POA: Insufficient documentation

## 2011-08-23 DIAGNOSIS — M542 Cervicalgia: Secondary | ICD-10-CM | POA: Insufficient documentation

## 2011-08-23 DIAGNOSIS — R05 Cough: Secondary | ICD-10-CM | POA: Insufficient documentation

## 2011-08-23 DIAGNOSIS — E119 Type 2 diabetes mellitus without complications: Secondary | ICD-10-CM | POA: Insufficient documentation

## 2011-08-23 DIAGNOSIS — R059 Cough, unspecified: Secondary | ICD-10-CM | POA: Insufficient documentation

## 2011-08-23 DIAGNOSIS — Z794 Long term (current) use of insulin: Secondary | ICD-10-CM | POA: Insufficient documentation

## 2011-08-23 DIAGNOSIS — E785 Hyperlipidemia, unspecified: Secondary | ICD-10-CM | POA: Insufficient documentation

## 2011-08-23 DIAGNOSIS — R0600 Dyspnea, unspecified: Secondary | ICD-10-CM

## 2011-08-23 DIAGNOSIS — Z79899 Other long term (current) drug therapy: Secondary | ICD-10-CM | POA: Insufficient documentation

## 2011-08-23 LAB — DIFFERENTIAL
Eosinophils Relative: 5 % (ref 0–5)
Lymphocytes Relative: 39 % (ref 12–46)
Lymphs Abs: 1.4 10*3/uL (ref 0.7–4.0)
Monocytes Absolute: 0.2 10*3/uL (ref 0.1–1.0)
Monocytes Relative: 6 % (ref 3–12)

## 2011-08-23 LAB — CBC
HCT: 39.9 % (ref 39.0–52.0)
Hemoglobin: 13.3 g/dL (ref 13.0–17.0)
MCH: 27.3 pg (ref 26.0–34.0)
MCV: 81.9 fL (ref 78.0–100.0)
RBC: 4.87 MIL/uL (ref 4.22–5.81)
WBC: 3.6 10*3/uL — ABNORMAL LOW (ref 4.0–10.5)

## 2011-08-23 LAB — BASIC METABOLIC PANEL
BUN: 23 mg/dL (ref 6–23)
CO2: 30 mEq/L (ref 19–32)
Calcium: 9.3 mg/dL (ref 8.4–10.5)
Creatinine, Ser: 0.95 mg/dL (ref 0.50–1.35)
Glucose, Bld: 331 mg/dL — ABNORMAL HIGH (ref 70–99)
Sodium: 134 mEq/L — ABNORMAL LOW (ref 135–145)

## 2011-08-23 MED ORDER — BENZONATATE 100 MG PO CAPS: 100.0000 mg | ORAL_CAPSULE | Freq: Three times a day (TID) | ORAL | Status: AC

## 2011-08-23 NOTE — ED Notes (Signed)
Sob, lt side of neck hurts

## 2011-08-23 NOTE — ED Provider Notes (Signed)
History  Scribed for Elmer Picker, MD, the patient was seen in [place]. The chart was scribed by Clarisa Fling. The patients care was started at 11:16 AM.   CSN: [id number]  Arrival date & time 08/23/11  1044   First MD Initiated Contact with Patient 08/23/11 1107      Chief Complaint  Patient presents with  . Shortness of Breath    (Consider location/radiation/quality/duration/timing/severity/associated sxs/prior treatment) HPI Charles Hart is a 56 y.o. male with a history of multiple illness including DM, HTN, and cardiomyopathy who presents to the Emergency Department complaining of shortness of breath onset ~2 hours. Reports that symptoms lasted about one hour but have now resolved. Also notes cough and left sided neck pain. Denies any nausea, vomiting, pain or swelling in legs, fever, or chills. Pt reports having heart attack in 2000 and denies use of nitroglycerin. States he had chest pain during previous heart attack but notes symptoms do not feel the same. Pt states he does not get chest pain or SOB from going up or down stairs in his house. Also notes catheterization four years ago where everything was normal. There are no other associated symptoms and no other alleviating or aggravating factors.   PCP: Dr. Ronnald Ramp   Past Medical History  Diagnosis Date  . Dermatitis   . CHF (congestive heart failure)     LV function improved from 2004 to 2008.  Historically, moderately dilated LV with EF 30-40% by 2D echo 08/14/2002.  Mild CAD with severe LV dysfunction by cardiac cath 09/2002.  Normal coronary arteries and normal LV function by cardiac cath 09/19/2006.  A 2-D echo on 04/01/2009 showed mild concentric hypertrophy and normal systolic (LVEF  123456) and doppler C/W with grade 1 diastolic dysfunction.  . Hypertension   . Diabetes mellitus   . Hyperlipidemia   . Hearing loss in right ear   . Cardiomyopathy     LV function improved from 2004 to 2008.  Historically,  moderately dilated LV with EF 30-40% by 2D echo 08/14/2002.  Mild CAD with severe LV dysfunction by cardiac cath 09/2002.  Normal coronary arteries and normal LV function by cardiac cath 09/19/2006.  A 2-D echo on 04/01/2009 showed mild concentric hypertrophy and normal systolic (LVEF  123456) and doppler C/W with grade 1 diastolic dysfunction.    Past Surgical History  Procedure Date  . Cardiac catheterization     3 times  . Colonoscopy   . Polypectomy     Family History  Problem Relation Age of Onset  . Colon cancer Sister     History  Substance Use Topics  . Smoking status: Never Smoker   . Smokeless tobacco: Never Used  . Alcohol Use: No      Review of Systems  Unable to perform ROS Constitutional: Negative for fever and chills.  HENT: Positive for neck pain.   Respiratory: Positive for cough and shortness of breath.   Cardiovascular: Negative for chest pain and leg swelling.  Gastrointestinal: Negative for nausea and vomiting.  All other systems reviewed and are negative.    Allergies  Review of patient's allergies indicates no known allergies.  Home Medications   Current Outpatient Rx  Name Route Sig Dispense Refill  . ACCU-CHEK FASTCLIX LANCETS MISC Does not apply 1 each by Does not apply route 2 (two) times daily. Use to check blood sugar as instructed up to 2 times a day.  Dx code 250.00 100 each 11  . AMLODIPINE BESYLATE 10  MG PO TABS Oral Take 1 tablet (10 mg total) by mouth daily. 90 tablet 3  . ASPIRIN EC 81 MG PO TBEC Oral Take 81 mg by mouth daily.      [name] CARVEDILOL 25 MG PO TABS Oral Take 1 tablet (25 mg total) by mouth 2 (two) times daily with a meal. 180 tablet 3  . CLONIDINE HCL 0.1 MG PO TABS Oral Take 3 tablets (0.3 mg total) by mouth 2 (two) times daily. 540 tablet 1  . DIGOXIN 0.25 MG PO TABS Oral Take 1 tablet (250 mcg total) by mouth daily. 90 tablet 3  . EXENATIDE 10 MCG/0.04ML [place] SOLN Subcutaneous Inject 0.04 mLs (10 mcg total) into the skin 2  (two) times daily with a meal. 2.4 mL 3    THANK YOU  . FLUOCINONIDE 0.1 % EX CREA  Apply to affected area three times daily 30 g 0  . FUROSEMIDE 40 MG PO TABS Oral Take 1.5 tablets (60 mg total) by mouth daily. 135 tablet 1  . GABAPENTIN 300 MG PO CAPS Oral Take 1 capsule (300 mg total) by mouth daily. 90 capsule 3  . GLUCOSE BLOOD VI STRP  Use as instructed to check blood sugar up to 2 times daily.  Dx code 250.00 100 each 12  . INSULIN PEN NEEDLE 32G X 4 MM MISC  Use as directed for twice daily Byetta injection. 100 each 6  . LISINOPRIL 40 MG PO TABS Oral Take 1 tablet (40 mg total) by mouth daily. 90 tablet 3  . METFORMIN HCL 1000 MG PO TABS Oral Take 1 tablet (1,000 mg total) by mouth 2 (two) times daily with a meal. 180 tablet 3  . METFORMIN HCL 1000 MG PO TABS Oral Take 1 tablet (1,000 mg total) by mouth 2 (two) times daily. 60 tablet 0  . ADULT MULTIVITAMIN W/MINERALS CH Oral Take 1 tablet by mouth daily.      [name] MUPIROCIN CALCIUM 2 % EX CREA Topical Apply topically 3 (three) times daily. 15 g 0  . PRAVASTATIN SODIUM 40 MG PO TABS Oral Take 1 tablet (40 mg total) by mouth daily. 90 tablet 3    Pulse 67  Temp(Src) 97.7 F (36.5 C) (Oral)  Resp 20  SpO2 98%  Physical Exam  Constitutional: He is oriented to person, place, and time. He appears well-developed and well-nourished.  Non-toxic appearance. He does not have a sickly appearance.  HENT:  Head: Normocephalic and atraumatic.  Eyes: Conjunctivae, EOM and lids are normal. Pupils are equal, round, and reactive to light.  Neck: Trachea normal, normal range of motion and full passive range of motion without pain. Neck supple.  Cardiovascular: Normal rate, regular rhythm and normal heart sounds.   Pulmonary/Chest: Effort normal and breath sounds normal. No respiratory distress.  Abdominal: Soft. Normal appearance and bowel sounds are normal. He exhibits no distension. There is no tenderness. There is no rebound and no CVA tenderness.    Musculoskeletal: Normal range of motion.  Neurological: He is alert and oriented to person, place, and time. He has normal strength.  Skin: Skin is warm, dry and intact. No rash noted.    ED Course  Procedures (including critical care time) 56 year old, male, with a history of diabetes, and coronary artery disease, presents to emergency department with shortness of breath.  That has resolved.  He denied chest pain.  The symptoms lasted for about an hour.  His wife says that he has had a cough, as well.  He denies nausea, vomiting, fevers, chills, diaphoresis, leg pain or swelling.  He does not get chest pain, or shortness breath, with exertion.  We will perform a chest x-ray.  In general, laboratory testing, but not cardiac enzymes, given the fact that he does not develop chest pain with exertion and he did not have chest pain.  Today.  Labs Reviewed - No data to display No results found.   No diagnosis found.  DIAGNOSTIC STUDIES: Oxygen Saturation is 98% on room air, normal by my interpretation.      LABS Results for orders placed during the hospital encounter of 08/23/11  CBC      Component Value Range   WBC 3.6 (*) 4.0 - 10.5 (K/uL)   RBC 4.87  4.22 - 5.81 (MIL/uL)   Hemoglobin 13.3  13.0 - 17.0 (g/dL)   HCT 39.9  39.0 - 52.0 (%)   MCV 81.9  78.0 - 100.0 (fL)   MCH 27.3  26.0 - 34.0 (pg)   MCHC 33.3  30.0 - 36.0 (g/dL)   RDW 12.3  11.5 - 15.5 (%)   Platelets 277  150 - 400 (K/uL)  DIFFERENTIAL      Component Value Range   Neutrophils Relative 50  43 - 77 (%)   Neutro Abs 1.8  1.7 - 7.7 (K/uL)   Lymphocytes Relative 39  12 - 46 (%)   Lymphs Abs 1.4  0.7 - 4.0 (K/uL)   Monocytes Relative 6  3 - 12 (%)   Monocytes Absolute 0.2  0.1 - 1.0 (K/uL)   Eosinophils Relative 5  0 - 5 (%)   Eosinophils Absolute 0.2  0.0 - 0.7 (K/uL)   Basophils Relative 1  0 - 1 (%)   Basophils Absolute 0.0  0.0 - 0.1 (K/uL)  BASIC METABOLIC PANEL      Component Value Range   Sodium 134 (*) 135  - 145 (mEq/L)   Potassium 3.7  3.5 - 5.1 (mEq/L)   Chloride 95 (*) 96 - 112 (mEq/L)   CO2 30  19 - 32 (mEq/L)   Glucose, Bld 331 (*) 70 - 99 (mg/dL)   BUN 23  6 - 23 (mg/dL)   Creatinine, Ser 0.95  0.50 - 1.35 (mg/dL)   Calcium 9.3  8.4 - 10.5 (mg/dL)   GFR calc non Af Amer >90  >90 (mL/min)   GFR calc Af Amer >90  >90 (mL/min)   Radiology: DG Chest 2 View. Reviewed by me. IMPRESSION: No active cardiopulmonary disease. Original Report Authenticated By: Raelyn Number, M.D.    COORDINATION OF CARE: 11:16am:  - Patient evaluated by ED physician, EKG, DG Chest, CBC, BMP, Diff ordered  ED ECG REPORT   Date: 08/23/2011  EKG Time: 12:54 PM  Rate: 65  Rhythm: normal sinus rhythm, "unchanged from previous tracings"  Axis: nl  Intervals:none  ST&T Change: nonspecific tw changes associated with lvh  Narrative Interpretation: nsr with lvh and nonspeficit tw changes             MDM  Dyspnea Resolved Presentation is not consistent with ACS.  Especially given the fact that he does not get chest pain.  Climbing stairs.  The symptoms are resolved.  His physical examination is unremarkable.  His EKG does not show cardiac ischemia.  We will release him with an antitussive.  I personally performed the services described in this documentation, which was scribed in my presence. The recorded information has been reviewed and considered.  Elmer Picker, MD 08/23/11 1317

## 2011-10-20 ENCOUNTER — Emergency Department (HOSPITAL_COMMUNITY)
Admission: EM | Admit: 2011-10-20 | Discharge: 2011-10-21 | Disposition: A | Discharge status: Home / Self Care | Payer: Medicare Other | Attending: Emergency Medicine | Admitting: Emergency Medicine

## 2011-10-20 ENCOUNTER — Encounter (HOSPITAL_COMMUNITY): Payer: Self-pay | Admitting: *Deleted

## 2011-10-20 DIAGNOSIS — I428 Other cardiomyopathies: Secondary | ICD-10-CM | POA: Insufficient documentation

## 2011-10-20 DIAGNOSIS — Z7982 Long term (current) use of aspirin: Secondary | ICD-10-CM | POA: Insufficient documentation

## 2011-10-20 DIAGNOSIS — I509 Heart failure, unspecified: Secondary | ICD-10-CM | POA: Insufficient documentation

## 2011-10-20 DIAGNOSIS — Z794 Long term (current) use of insulin: Secondary | ICD-10-CM | POA: Insufficient documentation

## 2011-10-20 DIAGNOSIS — I10 Essential (primary) hypertension: Secondary | ICD-10-CM | POA: Insufficient documentation

## 2011-10-20 DIAGNOSIS — E119 Type 2 diabetes mellitus without complications: Secondary | ICD-10-CM | POA: Insufficient documentation

## 2011-10-20 DIAGNOSIS — J019 Acute sinusitis, unspecified: Secondary | ICD-10-CM

## 2011-10-20 DIAGNOSIS — Z79899 Other long term (current) drug therapy: Secondary | ICD-10-CM | POA: Insufficient documentation

## 2011-10-20 DIAGNOSIS — E785 Hyperlipidemia, unspecified: Secondary | ICD-10-CM | POA: Insufficient documentation

## 2011-10-20 NOTE — ED Notes (Signed)
C/o swollen lymph nodes to neck and noted blood in mucus after blowing nose, denies fever

## 2011-10-21 MED ORDER — AMOXICILLIN-POT CLAVULANATE 875-125 MG PO TABS: 1.0000 | ORAL_TABLET | Freq: Two times a day (BID) | ORAL | Status: AC

## 2011-10-21 MED ORDER — AMOXICILLIN-POT CLAVULANATE 875-125 MG PO TABS
1.0000 | ORAL_TABLET | Freq: Once | ORAL | Status: AC
  Administered 2011-10-21: 1 via ORAL
  Filled 2011-10-21: qty 1

## 2011-10-21 NOTE — ED Provider Notes (Signed)
History     CSN: [id number]  Arrival date & time 10/20/11  2220   First MD Initiated Contact with Patient 10/20/11 2330      Chief Complaint  Patient presents with  . Sinusitis    (Consider location/radiation/quality/duration/timing/severity/associated sxs/prior treatment) Patient is a 56 y.o. male presenting with sinusitis. The history is provided by the patient.  Sinusitis  This is a new problem. The current episode started 2 days ago. The problem has not changed since onset.There has been no fever. The pain is at a severity of 3/10. The pain is mild. The pain has been constant since onset. Associated symptoms include congestion, sinus pressure, sore throat and swollen glands. Pertinent negatives include no chills, no ear pain, no hoarse voice, no cough and no shortness of breath. Associated symptoms comments: He has had purulent nasal discharge with blood clots, no active epistaxis.. Treatments tried: He has used Coricidin for congestion but has run out.  Has also tried Cepacol and cough drops. The treatment provided mild relief.    Past Medical History  Diagnosis Date  . Dermatitis   . CHF (congestive heart failure)     LV function improved from 2004 to 2008.  Historically, moderately dilated LV with EF 30-40% by 2D echo 08/14/2002.  Mild CAD with severe LV dysfunction by cardiac cath 09/2002.  Normal coronary arteries and normal LV function by cardiac cath 09/19/2006.  A 2-D echo on 04/01/2009 showed mild concentric hypertrophy and normal systolic (LVEF  123456) and doppler C/W with grade 1 diastolic dysfunction.  . Hypertension   . Diabetes mellitus   . Hyperlipidemia   . Hearing loss in right ear   . Cardiomyopathy     LV function improved from 2004 to 2008.  Historically, moderately dilated LV with EF 30-40% by 2D echo 08/14/2002.  Mild CAD with severe LV dysfunction by cardiac cath 09/2002.  Normal coronary arteries and normal LV function by cardiac cath 09/19/2006.  A 2-D echo on  04/01/2009 showed mild concentric hypertrophy and normal systolic (LVEF  123456) and doppler C/W with grade 1 diastolic dysfunction.    Past Surgical History  Procedure Date  . Cardiac catheterization     3 times  . Colonoscopy   . Polypectomy     Family History  Problem Relation Age of Onset  . Colon cancer Sister     History  Substance Use Topics  . Smoking status: Never Smoker   . Smokeless tobacco: Never Used  . Alcohol Use: No      Review of Systems  Constitutional: Negative for fever and chills.  HENT: Positive for nosebleeds, congestion, sore throat and sinus pressure. Negative for ear pain, hoarse voice, facial swelling and neck pain.   Eyes: Negative.   Respiratory: Negative for cough, chest tightness and shortness of breath.   Cardiovascular: Negative for chest pain.  Gastrointestinal: Negative for nausea and abdominal pain.  Genitourinary: Negative.   Musculoskeletal: Negative for joint swelling and arthralgias.  Skin: Negative.  Negative for rash and wound.  Neurological: Negative for dizziness, weakness, light-headedness, numbness and headaches.  Hematological: Negative.   Psychiatric/Behavioral: Negative.     Allergies  Review of patient's allergies indicates no known allergies.  Home Medications   Current Outpatient Rx  Name Route Sig Dispense Refill  . ACCU-CHEK FASTCLIX LANCETS MISC Does not apply 1 each by Does not apply route 2 (two) times daily. Use to check blood sugar as instructed up to 2 times a day.  Dx code  250.00 100 each 11  . AMLODIPINE BESYLATE 10 MG PO TABS Oral Take 1 tablet (10 mg total) by mouth daily. 90 tablet 3  . AMOXICILLIN-POT CLAVULANATE 875-125 MG PO TABS Oral Take 1 tablet by mouth 2 (two) times daily. 20 tablet 0  . ASPIRIN EC 81 MG PO TBEC Oral Take 81 mg by mouth daily.      [name] CARVEDILOL 25 MG PO TABS Oral Take 1 tablet (25 mg total) by mouth 2 (two) times daily with a meal. 180 tablet 3  . CLONIDINE HCL 0.1 MG PO TABS  Oral Take 3 tablets (0.3 mg total) by mouth 2 (two) times daily. 540 tablet 1  . DIGOXIN 0.25 MG PO TABS Oral Take 1 tablet (250 mcg total) by mouth daily. 90 tablet 3  . EXENATIDE 10 MCG/0.04ML [place] SOLN Subcutaneous Inject 0.04 mLs (10 mcg total) into the skin 2 (two) times daily with a meal. 2.4 mL 3    THANK YOU  . FLUOCINONIDE 0.1 % EX CREA  Apply to affected area three times daily 30 g 0  . FUROSEMIDE 40 MG PO TABS Oral Take 1.5 tablets (60 mg total) by mouth daily. 135 tablet 1  . GABAPENTIN 300 MG PO CAPS Oral Take 1 capsule (300 mg total) by mouth daily. 90 capsule 3  . GLUCOSE BLOOD VI STRP  Use as instructed to check blood sugar up to 2 times daily.  Dx code 250.00 100 each 12  . INSULIN PEN NEEDLE 32G X 4 MM MISC  Use as directed for twice daily By[place] injection. 100 each 6  . LISINOPRIL 40 MG PO TABS Oral Take 1 tablet (40 mg total) by mouth daily. 90 tablet 3  . METFORMIN HCL 1000 MG PO TABS Oral Take 1 tablet (1,000 mg total) by mouth 2 (two) times daily with a meal. 180 tablet 3  . ADULT MULTIVITAMIN W/MINERALS CH Oral Take 1 tablet by mouth daily.      [name] PRAVASTATIN SODIUM 40 MG PO TABS Oral Take 1 tablet (40 mg total) by mouth daily. 90 tablet 3    BP 178/88  Pulse 85  Temp(Src) 98.4 F (36.9 C) (Oral)  Resp 16  Ht 6' (1.829 m)  Wt 216 lb (97.977 kg)  BMI 29.29 kg/m2  SpO2 99%  Physical Exam  Nursing note and vitals reviewed. Constitutional: He is oriented to person, place, and time. He appears well-developed and well-nourished.  HENT:  Head: Normocephalic and atraumatic.  Right Ear: External ear normal.  Left Ear: External ear normal.  Nose: Mucosal edema and rhinorrhea present. Right sinus exhibits maxillary sinus tenderness. Left sinus exhibits maxillary sinus tenderness.       Purulent and bloody discharge bilateral nares.  Eyes: Conjunctivae are normal.  Neck: Normal range of motion.  Cardiovascular: Normal rate, regular rhythm, normal heart sounds and intact  distal pulses.   Pulmonary/Chest: Effort normal and breath sounds normal. He has no wheezes.  Abdominal: Soft. Bowel sounds are normal. There is no tenderness.  Musculoskeletal: Normal range of motion.  Neurological: He is alert and oriented to person, place, and time.  Skin: Skin is warm and dry.  Psychiatric: He has a normal mood and affect.    ED Course  Procedures (including critical care time)  Labs Reviewed - No data to display No results found.   1. Sinusitis, acute       MDM  Augmentin twice a day.  Recommended to continue with Coricidin decongestant product, nasal saline  spray.  Patient reports his blood sugars have been under fair control, last checked today was 170, not repeated this evening.   encouraged to followup with his PCP if symptoms are not improving.      Fulton Reek, PA 10/21/11 0107

## 2011-10-21 NOTE — ED Notes (Signed)
Discharge instructions reviewed with pt, questions answered. Pt verbalized understanding.  

## 2011-10-22 NOTE — ED Provider Notes (Signed)
Medical screening examination/treatment/procedure(s) were performed by non-physician practitioner and as supervising physician I was immediately available for consultation/collaboration.   Johnna Acosta, MD 10/22/11 [phone]

## 2011-11-04 ENCOUNTER — Encounter: Payer: Self-pay | Admitting: Cardiovascular Disease

## 2011-11-04 ENCOUNTER — Ambulatory Visit (INDEPENDENT_AMBULATORY_CARE_PROVIDER_SITE_OTHER): Payer: Medicare Other | Admitting: Cardiovascular Disease

## 2011-11-04 VITALS — BP 124/78 | HR 73 | Ht 72.0 in | Wt 216.4 lb

## 2011-11-04 DIAGNOSIS — I509 Heart failure, unspecified: Secondary | ICD-10-CM

## 2011-11-04 DIAGNOSIS — I10 Essential (primary) hypertension: Secondary | ICD-10-CM

## 2011-11-04 DIAGNOSIS — E785 Hyperlipidemia, unspecified: Secondary | ICD-10-CM

## 2011-11-04 DIAGNOSIS — E119 Type 2 diabetes mellitus without complications: Secondary | ICD-10-CM

## 2011-11-04 NOTE — Assessment & Plan Note (Signed)
Well controlled.  Continue current medications and low sodium Dash type diet.    

## 2011-11-04 NOTE — Progress Notes (Signed)
Patient ID: Charles Hart, male   DOB: 11/02/1955, 56 y.o.   MRN: [name] 56 yo with DM,HTN history of abnormal ECG and DCM. Previous EF 30-35% with poorly controlled BP. Improved to normal on echo 2010 with compliance of meds.  No dyspnea,sscp,palpitatins,edema or syncope. Primary has stopped his actos and glipizide and started Byetta. Compliant with meds and BP has been good. ECG chronically abnormal with LVH changes. Saw Dr Katy Fitch in January and eyes are fine. No other end organ damage from DM.   BS out of control in January due to med compliance issues. 2 grand daughters were living with him for a while and there was a lot of stress.  Not working since September  Echo 11/03/10  Study Conclusions Left ventricle: The cavity size was mildly dilated. Wall thickness was normal. Systolic function was normal. The estimated ejection fraction was in the range of 55% to 60%. Wall motion was normal; there were no regional wall motion abnormalities. Transthoracic echocardiography. M-mode, complete 2D, spectral Doppler, and color Doppler. Height: Height: 180.3cm. Height: 71in. Weight: Weight: 115.2kg. Weight: 253.5lb. Body mass index: BMI: 35.4kg/m^2. Body surface area: BSA: 2.71m^2. Blood pressure: 137/67. Patient status: Outpatient. Location: [facility]    ROS: Denies fever, malais, weight loss, blurry vision, decreased visual acuity, cough, sputum, SOB, hemoptysis, pleuritic pain, palpitaitons, heartburn, abdominal pain, melena, lower extremity edema, claudication, or rash.  All other systems reviewed and negative  General: Affect appropriate Healthy:  appears stated age 59: normal Neck supple with no adenopathy JVP normal no bruits no thyromegaly Lungs clear with no wheezing and good diaphragmatic motion Heart:  S1/S2 no murmur, no rub, gallop or click PMI normal Abdomen: benighn, BS positve, no tenderness, no AAA no bruit.  No HSM or HJR Distal pulses intact with no bruits No  edema Neuro non-focal Skin warm and dry No muscular weakness   Current Outpatient Prescriptions  Medication Sig Dispense Refill  . ACCU-CHEK FASTCLIX LANCETS MISC 1 each by Does not apply route 2 (two) times daily. Use to check blood sugar as instructed up to 2 times a day.  Dx code 250.00  100 each  11  . amLODipine (NORVASC) 10 MG tablet Take 1 tablet (10 mg total) by mouth daily.  90 tablet  3  . aspirin EC 81 MG tablet Take 81 mg by mouth daily.        . carvedilol (COREG) 25 MG tablet Take 1 tablet (25 mg total) by mouth 2 (two) times daily with a meal.  180 tablet  3  . cloNIDine (CATAPRES) 0.1 MG tablet Take 3 tablets (0.3 mg total) by mouth 2 (two) times daily.  540 tablet  1  . digoxin (LANOXIN) 0.25 MG tablet Take 1 tablet (250 mcg total) by mouth daily.  90 tablet  3  . exenatide (BYETTA 10 MCG PEN) 10 MCG/0.04ML SOLN Inject 0.04 mLs (10 mcg total) into the skin 2 (two) times daily with a meal.  2.4 mL  3  . Fluocinonide 0.1 % CREA Apply to affected area three times daily  30 g  0  . furosemide (LASIX) 40 MG tablet Take 1.5 tablets (60 mg total) by mouth daily.  135 tablet  1  . gabapentin (NEURONTIN) 300 MG capsule Take 1 capsule (300 mg total) by mouth daily.  90 capsule  3  . glucose blood (ACCU-CHEK SMARTVIEW) test strip Use as instructed to check blood sugar up to 2 times daily.  Dx code 250.00  100 each  12  . Insulin Pen Needle (BD PEN NEEDLE NANO U/F) 32G X 4 MM MISC Use as directed for twice daily Byetta injection.  100 each  6  . lisinopril (PRINIVIL,ZESTRIL) 40 MG tablet Take 1 tablet (40 mg total) by mouth daily.  90 tablet  3  . metFORMIN (GLUCOPHAGE) 1000 MG tablet Take 1 tablet (1,000 mg total) by mouth 2 (two) times daily with a meal.  180 tablet  3  . Multiple Vitamin (MULITIVITAMIN WITH MINERALS) TABS Take 1 tablet by mouth daily.        . pravastatin (PRAVACHOL) 40 MG tablet Take 1 tablet (40 mg total) by mouth daily.  90 tablet  3    Allergies  Review of  patient's allergies indicates no known allergies.  Electrocardiogram: 08/24/11  NSR rate 62 LVH with strain  Assessment and Plan

## 2011-11-04 NOTE — Patient Instructions (Signed)
Your physician wants you to follow-up in: [place] will receive a reminder letter in the mail two months in advance. If you don't receive a letter, please call our office to schedule the follow-up appointment. Your physician recommends that you continue on your current medications as directed. Please refer to the Current Medication list given to you today.

## 2011-11-04 NOTE — Assessment & Plan Note (Signed)
Discussed importance of diet and compliance with meds.  Target A1c 6.5 or less

## 2011-11-04 NOTE — Assessment & Plan Note (Signed)
EF normal by echo 3/12  Continue Rx for risk factors and good control of DM and HTN

## 2011-11-04 NOTE — Assessment & Plan Note (Signed)
Cholesterol is at goal.  Continue current dose of statin and diet Rx.  No myalgias or side effects.  F/U  LFT's in 6 months. Lab Results  Component Value Date   LDLCALC 40 05/19/2010

## 2011-11-29 ENCOUNTER — Other Ambulatory Visit: Payer: Self-pay | Admitting: Internal Medicine

## 2012-03-09 ENCOUNTER — Other Ambulatory Visit: Payer: Self-pay | Admitting: Internal Medicine

## 2012-03-09 NOTE — Telephone Encounter (Signed)
Please schedule a followup appointment in my clinic.

## 2012-03-12 NOTE — Telephone Encounter (Signed)
Message sent to front desk to schedule pt an appt. 

## 2012-03-26 ENCOUNTER — Other Ambulatory Visit: Payer: Self-pay | Admitting: *Deleted

## 2012-03-26 MED ORDER — CLONIDINE HCL 0.1 MG PO TABS: 0.3000 mg | ORAL_TABLET | Freq: Two times a day (BID) | ORAL | Status: DC

## 2012-04-18 ENCOUNTER — Encounter: Payer: Self-pay | Admitting: Internal Medicine

## 2012-04-18 ENCOUNTER — Ambulatory Visit (INDEPENDENT_AMBULATORY_CARE_PROVIDER_SITE_OTHER): Payer: Medicare Other | Admitting: Internal Medicine

## 2012-04-18 VITALS — BP 121/73 | HR 69 | Temp 97.3°F | Ht 72.0 in | Wt 219.2 lb

## 2012-04-18 DIAGNOSIS — E785 Hyperlipidemia, unspecified: Secondary | ICD-10-CM

## 2012-04-18 DIAGNOSIS — E119 Type 2 diabetes mellitus without complications: Secondary | ICD-10-CM

## 2012-04-18 DIAGNOSIS — I10 Essential (primary) hypertension: Secondary | ICD-10-CM

## 2012-04-18 DIAGNOSIS — N529 Male erectile dysfunction, unspecified: Secondary | ICD-10-CM

## 2012-04-18 DIAGNOSIS — Z79899 Other long term (current) drug therapy: Secondary | ICD-10-CM

## 2012-04-18 DIAGNOSIS — Z5181 Encounter for therapeutic drug level monitoring: Secondary | ICD-10-CM

## 2012-04-18 LAB — COMPLETE METABOLIC PANEL WITH GFR
AST: 13 U/L (ref 0–37)
Alkaline Phosphatase: 41 U/L (ref 39–117)
BUN: 24 mg/dL — ABNORMAL HIGH (ref 6–23)
Calcium: 9.4 mg/dL (ref 8.4–10.5)
Chloride: 98 mEq/L (ref 96–112)
Creat: 1.02 mg/dL (ref 0.50–1.35)

## 2012-04-18 LAB — LIPID PANEL
Cholesterol: 147 mg/dL (ref 0–200)
Triglycerides: 385 mg/dL — ABNORMAL HIGH (ref <150)
VLDL: 77 mg/dL — ABNORMAL HIGH (ref 0–40)

## 2012-04-18 MED ORDER — INSULIN PEN NEEDLE 32G X 4 MM MISC: Status: DC

## 2012-04-18 MED ORDER — INSULIN GLARGINE 100 UNIT/ML ~~LOC~~ SOLN: 10.0000 [IU] | Freq: Every day | SUBCUTANEOUS | Status: DC

## 2012-04-18 NOTE — Assessment & Plan Note (Signed)
Lab Results  Component Value Date   NA 134* 08/23/2011   K 3.7 08/23/2011   CL 95* 08/23/2011   CO2 30 08/23/2011   BUN 23 08/23/2011   CREATININE 0.95 08/23/2011    BP Readings from Last 3 Encounters:  04/18/12 121/73  11/04/11 124/78  10/20/11 178/88    Assessment: Hypertension control:  controlled  Progress toward goals:  at goal Barriers to meeting goals:  no barriers identified  Plan: Hypertension treatment:  continue current medications (amlodipine 10 mg daily, carvedilol 25 mg twice a day, clonidine 0.3 mg twice a day, furosemide 60 mg daily, and lisinopril 40 mg daily).

## 2012-04-18 NOTE — Assessment & Plan Note (Signed)
Lipids:    Component Value Date/Time   CHOL 123 05/19/2010 2019   TRIG 257* 05/19/2010 2019   HDL 32* 05/19/2010 2019   LDLCALC 40 05/19/2010 2019   VLDL 51* 05/19/2010 2019   CHOLHDL 3.8 Ratio 05/19/2010 2019   Assessment: Patient is doing well on pravastatin 40 mg daily, with no apparent side effects.  Plan: Continue pravastatin 40 mg daily; check a lipid panel today.

## 2012-04-18 NOTE — Assessment & Plan Note (Signed)
Assessment: Patient reports difficulty maintaining erections, and inquired whether this could be due to medication side effects.  I discussed with him the possibility that this is due to medication side effects, but also that he could simply be the effects of uncontrolled diabetes.  I emphasized the importance of good control of his for diabetes.   Plan: I discussed with them the option of trying Viagra or an alternative medication, and I offered to refer him to a urologist for evaluation and discussion of treatment options, but he declined and said he would like to consider this further.  He will let me know if he desires urology referral or a trial of medication.

## 2012-04-18 NOTE — Assessment & Plan Note (Addendum)
  Component Value Date/Time   HGBA1C 10.6 04/18/2012 0939   HGBA1C 7.7 05/11/2011 1152   HGBA1C 8.6 02/23/2011 1052     Assessment: Diabetes control: not controlled Progress toward goals: deteriorated Barriers to meeting goals: nonadherence to medications; patient stopped taking Byetta because of concerns about dermatologic side effects, and his hemoglobin A1c reflects poor control of his diabetes.  I talked at length with him about the importance of good control, especially since he has mild background diabetic retinopathy as per his ophthalmologist.  He was previously not well controlled on 3 oral agents, and I do not think we can likely achieve control with oral therapy alone.    Plan: Diabetes treatment: stop Byetta and start Lantus Solostar insulin 10 units each evening; continue metformin 1000 mg twice a day; I advised patient to check his blood sugar twice a day and bring his meter and all medications to his next visit.  Will check a comprehensive metabolic panel today. Refer to: diabetes educator for self-management training Instruction/counseling given: reminded to bring blood glucose meter & log to each visit and reminded to bring medications to each visit

## 2012-04-18 NOTE — Patient Instructions (Signed)
1.  Stop Byetta. 2.  Start Lantus insulin 10 units once daily at bedtime. 3.  Check your blood sugar twice a day before breakfast and before supper. 4.  Bring all of your medications and your glucose meter to each clinic visit.

## 2012-04-18 NOTE — Progress Notes (Signed)
  Subjective:    Patient ID: Charles Hart, male    DOB: May 12, 1956, 56 y.o.   MRN: [id number]  HPI Patient presents for followup of his diabetes mellitus, hyperlipidemia, hypertension, and other chronic medical problems.  He reports that he has been doing well overall.  He does complain of some problems with erectile dysfunction, characterized by trouble maintaining an erection.  Otherwise he has no complaints today.  He did not bring his medications to clinic, but he did bring a list of those medications.  He acknowledges that he has stopped taking Byetta because of concern about small areas of eczema that he attributes to the Byetta.  These areas are present on his flanks and abdomen, and also have occurred on his chest, groin, and hands.  He has seen a dermatologist who is managing the eczema, but he says that this problem started after he began taking Byetta and he believes it is related to the Byetta.  He has not been monitoring his blood sugars at home.  Review of Systems  Constitutional: Negative for fever, chills and diaphoresis.  Respiratory: Negative for chest tightness, shortness of breath (No dyspnea, orthopnea, or PND) and wheezing.   Cardiovascular: Negative for chest pain and leg swelling.  Gastrointestinal: Negative for nausea, vomiting, abdominal pain, blood in stool and anal bleeding.  Genitourinary: Negative for dysuria, frequency and difficulty urinating.       Objective:   Physical Exam  Constitutional: No distress.  Cardiovascular: Normal rate, regular rhythm and normal heart sounds.  Exam reveals no gallop and no friction rub.   No murmur heard.      No lower extremity edema  Pulmonary/Chest: Effort normal and breath sounds normal. No respiratory distress. He has no wheezes. He has no rales.  Abdominal: Soft. Bowel sounds are normal. There is no tenderness. There is no guarding.  Skin:          Assessment & Plan:

## 2012-04-20 ENCOUNTER — Encounter: Payer: Self-pay | Admitting: Internal Medicine

## 2012-04-20 DIAGNOSIS — E11319 Type 2 diabetes mellitus with unspecified diabetic retinopathy without macular edema: Secondary | ICD-10-CM

## 2012-04-20 DIAGNOSIS — E113593 Type 2 diabetes mellitus with proliferative diabetic retinopathy without macular edema, bilateral: Secondary | ICD-10-CM | POA: Insufficient documentation

## 2012-04-20 DIAGNOSIS — Z794 Long term (current) use of insulin: Secondary | ICD-10-CM | POA: Insufficient documentation

## 2012-04-20 HISTORY — DX: Type 2 diabetes mellitus with unspecified diabetic retinopathy without macular edema: E11.319

## 2012-06-06 ENCOUNTER — Telehealth: Payer: Self-pay | Admitting: *Deleted

## 2012-06-06 NOTE — Telephone Encounter (Signed)
Call from [name], pharmacist with [employer] called to report pt states his CBG was elevated to 220.  She wants [name] to be aware of this.  Pt called and he reports cbg does run high.  He cbg stays in the 200's Pt scheduled for appointment tomorrow in clinic with Dr Owens Shark.

## 2012-06-07 ENCOUNTER — Ambulatory Visit (INDEPENDENT_AMBULATORY_CARE_PROVIDER_SITE_OTHER): Payer: Medicare Other | Admitting: Internal Medicine

## 2012-06-07 ENCOUNTER — Encounter: Payer: Self-pay | Admitting: Internal Medicine

## 2012-06-07 VITALS — BP 137/78 | HR 61 | Temp 97.4°F | Ht 72.0 in | Wt 223.9 lb

## 2012-06-07 DIAGNOSIS — E1149 Type 2 diabetes mellitus with other diabetic neurological complication: Secondary | ICD-10-CM

## 2012-06-07 DIAGNOSIS — E785 Hyperlipidemia, unspecified: Secondary | ICD-10-CM

## 2012-06-07 DIAGNOSIS — I10 Essential (primary) hypertension: Secondary | ICD-10-CM

## 2012-06-07 DIAGNOSIS — E119 Type 2 diabetes mellitus without complications: Secondary | ICD-10-CM

## 2012-06-07 DIAGNOSIS — L259 Unspecified contact dermatitis, unspecified cause: Secondary | ICD-10-CM

## 2012-06-07 DIAGNOSIS — N529 Male erectile dysfunction, unspecified: Secondary | ICD-10-CM

## 2012-06-07 NOTE — Progress Notes (Signed)
[name] is a 56 year old man with a history of type 2 diabetes mellitus, hyperlipidemia, hypertension, and erectile dysfunction at the clinic for further diabetes management.  Subjective   Charles Hart feels that his diabetes is poorly controlled. He checks his blood sugars sporadically, a few times per week in the morning, and reports that the levels are typically in the mid-200 range. Regularly checking his glucose twice daily is felt to be an annoyance. He dutifully takes all his prescribed medications, without any problems obtaining them. He injects his insulin glargine into his lower and upper abdomen without problems. He denies any episodes of hypoglycemia, as indicated by trembling, dizziness, sweating, or nervousness. He has exercised less lately, which he attributes to discomfort following a surgical procedure on his feet last year. His diet consists largely of baked foods, with occasional fried fish, and occasional sweet tea/soda.  ROS  CV- No chest pain GU- No dysuria, polyuria, polydipsia, ED improved GI- no constipation, diarrhea, nausea, vomiting,  Eyes- No changes in vision Skin- No new skin lesions MSK- Checks feet daily for ulcers and applies lotion, no current ulcers, no foot pain/paresthesias  Objective  Vitals- BP 137/78  Pulse 61  Temp 97.4 F (36.3 C) (Oral)  Ht 6' (1.829 m)  Wt 223 lb 14.4 oz (101.56 kg)  BMI 30.37 kg/m2  SpO2 98%  Physical Examination: General appearance - alert, well appearing, and in no distress Mental status - alert, oriented to person, place, and time Eyes - pupils equal and reactive, extraocular eye movements intact, sclera anicteric Ears - hearing grossly normal bilaterally Nose - normal and patent, no erythema, discharge or polyps Neck - bilateral symmetric anterior adenopathy, carotids upstroke normal bilaterally, no bruits Lymphatics - moderate nontender anterior cervical nodes Chest - clear to auscultation, no wheezes, rales  or rhonchi, symmetric air entry, no tachypnea, retractions or cyanosis Heart - normal rate and regular rhythm, S1 and S2 normal, no murmurs noted, no gallops noted, no JVD Abdomen - soft, nontender, nondistended, no masses or organomegaly, bowel sounds normal, no abdominal bruits, no pulsatile masses, no CVA tenderness Neurological - alert, oriented, normal speech, no focal findings or movement disorder noted, cranial nerves II through XII intact Musculoskeletal - no joint tenderness, deformity or swelling, not examined Extremities - peripheral pulses normal, no clubbing or cyanosis Skin - well demarcated, macular, dark brown rash over abdomen, otherwise normal coloration and turgor, no suspicious skin lesions noted  Assessment/Plan  **Diabetes Mellitus, Type II: Following an A1C measurement of 10.6 (04/18/12) and markedly elevated random glucose checks reaching the 300s, Charles Hart' diabetes management was recently modified to include 10 units of baseline insulin glargine administered at bedtime. He remains on metformin 1000mg  BID. Given the absence of hypoglycemic episodes and patient reports of morning glucose in the mid-200s, we increase the baseline glargine to 15 units. Furthermore, we provided education on the appropriate insulin administration sites (namely: lower abdomen, lateral thighs, superior lateral quadrant of buttocks, and posterior arm). We encouraged Charles Hart to check his glucose twice daily at least every day for 1 week, or every other day for 2 weeks, so that we may do a better job in the future of assessing his diabetes control. -Recheck A1C at 3 month interval (soon after 07/18/2012) -Increased insulin glargine to 15 units QD -Patient education -Follow up appointment in 4-6 weeks.  **Hypertension: His blood pressure management has been adequate under his current medication regimen. This morning, his BP was 137/ 78. If  his SBP remains above 130 at the next visit, we may need to  tailor his medication accordingly. No changes indicated at this time.  **Hyperlipidemia: Not assessed at this visit. No changes to current med regimen indicated.  **Eczema- Charles Hart reports that his eczema that 'comes and goes,' and is being managed by a dermatologist in [place].    **Erectile dysfunction- First reported at last visit, Charles Hart states that his erectile dysfunction has shown significant improvement over the past month. He does not wish to see or a urologist or start a new medication at this time.  **Ophthalmology- Charles Hart has a history of mild retinopathy, and was last assessed by an ophthalmologist on 03/30/12. He will need a 1 year follow up appointment.

## 2012-06-07 NOTE — Progress Notes (Signed)
Subjective:    Patient ID: Charles Hart., male    DOB: [birth year], 56 y.o.   MRN: [id number]  CC: Acute visit for elevated blood sugars  HPI  [name] is a 56yo man who presents because his blood sugars were found to be elevated above the 200s.    He was recently switched to Lantus 10 units of insulin in the evening and Metformin 1000mg  PO BID.  He is taking his medications as prescribed and has not had any issue with his insulin.  He specifically denies polyuria, polydipsia, blurry vision or symptoms of hypoglycemia including tremors, lightheadedness, dizziness or passing out.  He notes that he only checks his blood sugar occasionally and has not been checking twice a day as requested by his PCP.  He says he has no barrier to checking his sugar (has all the supplies he needs), he just doesn't like sticking himself so often.   He is currently giving himself insulin in his lower and upper abdomen, alternating sides and is interested if he can use other sites for injections.   He has a history of mild retinopathy and peripheral neuropathy.   He is a never smoker, denies ETOH and illicit drug use.   Medications are updated and reviewed in EPIC.   Current outpatient prescriptions: ACCU-CHEK FASTCLIX LANCETS MISC, 1 each by Does not apply route 2 (two) times daily. Use to check blood sugar as instructed up to 2 times a day. amLODipine (NORVASC) 10 MG tablet, TAKE 1 TABLET (10 MG TOTAL) BY MOUTH DAILY aspirin EC 81 MG tablet, Take 81 mg by mouth daily carvedilol (COREG) 25 MG tablet, TAKE 1 TABLET (25 MG TOTAL) BY MOUTH 2 (TWO) TIMES DAILY WITH A MEAL cloNIDine (CATAPRES) 0.1 MG tablet, Take 3 tablets (0.3 mg total) by mouth 2 (two) times daily digoxin (LANOXIN) 0.25 MG tablet, TAKE 1 TABLET (250 MCG TOTAL) BY MOUTH DAILY. furosemide (LASIX) 40 MG tablet, TAKE 1 AND 1/2 TABLETS BY MOUTH DAILY gabapentin (NEURONTIN) 300 MG capsule, TAKE 1 CAPSULE (300 MG TOTAL) BY MOUTH DAILY. insulin  glargine (LANTUS SOLOSTAR) 100 UNIT/ML injection, Inject 10 Units into the skin at bedtime Insulin Pen Needle (BD PEN NEEDLE NANO U/F) 32G X 4 MM MISC lisinopril (PRINIVIL,ZESTRIL) 40 MG tablet, TAKE 1 TABLET (40 MG TOTAL) BY MOUTH DAILY metFORMIN (GLUCOPHAGE) 1000 MG tablet, TAKE 1 TABLET (1,000 MG TOTAL) BY MOUTH 2 (TWO) TIMES DAILY WITH A MEAL Multiple Vitamin (MULITIVITAMIN WITH MINERALS) TABS, Take 1 tablet by mouth daily pravastatin (PRAVACHOL) 40 MG tablet, TAKE 1 TABLET (40 MG TOTAL) BY MOUTH DAILY   Review of Systems  Constitutional: Negative for fever and chills.  HENT: Negative for hearing loss, sneezing and sinus pressure.   Eyes: Negative for pain and visual disturbance.  Respiratory: Negative for cough and shortness of breath.   Cardiovascular: Negative for chest pain and leg swelling.  Gastrointestinal: Negative for nausea, vomiting, abdominal pain and diarrhea.  Genitourinary: Negative for dysuria and difficulty urinating.  Musculoskeletal: Negative for arthralgias and gait problem.  Skin: Positive for rash. Negative for color change.       H/o eczema  Neurological: Negative for dizziness, syncope and numbness.  Hematological: Negative for adenopathy.  Psychiatric/Behavioral: Negative for confusion and decreased concentration.      Objective:   Physical Exam  Constitutional: He is oriented to person, place, and time. He appears well-developed and well-nourished. No distress.  HENT:  Head: Normocephalic and atraumatic.  Eyes: Conjunctivae normal and EOM are  normal. No scleral icterus.  Cardiovascular: Normal rate, regular rhythm, normal heart sounds and intact distal pulses.   No murmur heard. Pulmonary/Chest: Effort normal and breath sounds normal. No respiratory distress. He has no wheezes.  Abdominal: Soft. Bowel sounds are normal. There is no tenderness.  Musculoskeletal: He exhibits no edema.  Neurological: He is alert and oriented to person, place, and time.    Skin: Skin is warm and dry. No rash noted.  Psychiatric: He has a normal mood and affect. His behavior is normal.   CBG 204     Assessment & Plan:  Please see problem oriented charting.

## 2012-06-07 NOTE — Telephone Encounter (Deleted)
Thank you for the information. I think scheduling for tomorrow with Dr. Owens Shark is great. Until tomorrow the patient can continue her current diabetes medication regimen and measure his blood sugars 2 hours post dinner today and fasting before breakfast tomorrow. If the blood sugars go greater than 250, and he feels sickly, I would advise an ER visit to check for ketones in blood. Otherwise, I think its okay to see him tomorrow.   Thanks [employer]

## 2012-06-07 NOTE — Patient Instructions (Addendum)
Please schedule a follow up visit within the next 6 weeks with Dr. Marinda Elk if available, if not with [facility] resident for Diabetes Management.   For your medications:   Please bring all of your pill  Bottles with you to each visit.  This will help make sure that we have an up to date list of all the medications you are taking.  Please also bring any over the counter herbal medications you are taking (not including advil, tylenol, etc.)  Please continue taking all of your medications as prescribed.  Please increase your Insulin Glargine (Lantus) to 15 Units in the evening.   For your Diabetes:   Please continue to check your blood sugar 2 times a day.   Please keep a record of your blood sugars, either in your glucometer or on paper.  Please bring your glucometer to every clinic visit.   Please check your feet at least once weekly to see if you are developing calluses or wounds.   Continue to follow a good diet, including limiting refined sugars (sweet tea, soda) and carbohydrates and exercising at least 3 times per week.     Thank you!

## 2012-06-08 NOTE — Assessment & Plan Note (Addendum)
Charles Hart presented because his blood sugars were elevated at home without symptoms.  He was concerned because he continues to have sugars over 200 whenever he checks them, which is not often.   Had a long discussion with Charles Hart concerning the need to check his blood sugar regularly so that his insulin can be titrated up or down as needed.  Requested that he check is BS at least twice a day for 2 weeks to get an idea of how his sugars are fluctuating throughout the day.  Also discussed signs/symptoms of hypoglycemia.   Increased his insulin to 15 Units at night during this visit.  Requested new appointment in the clinic in 6 weeks with blood sugar readings to assess for further titration of insulin and to recheck a Hgb A1C.   Also provided a handout with appropriate sites for insulin injection including upper thighs and lower abdomen.  Advised against injection sites in the upper abdomen

## 2012-06-08 NOTE — Assessment & Plan Note (Signed)
BP 137/78, which is slightly out of goal range of < 130/80.  However, not to the point of changing therapy at this time.  Would reassess at next visit in 6 weeks and adjust medication regimen if needed.

## 2012-06-18 ENCOUNTER — Other Ambulatory Visit: Payer: Self-pay | Admitting: Internal Medicine

## 2012-07-03 ENCOUNTER — Encounter (HOSPITAL_COMMUNITY): Payer: Self-pay | Admitting: *Deleted

## 2012-07-03 ENCOUNTER — Emergency Department (HOSPITAL_COMMUNITY): Payer: PRIVATE HEALTH INSURANCE

## 2012-07-03 ENCOUNTER — Inpatient Hospital Stay (HOSPITAL_COMMUNITY)
Admission: EM | Admit: 2012-07-03 | Discharge: 2012-07-05 | DRG: 065 | Disposition: A | Discharge status: Home / Self Care | Payer: PRIVATE HEALTH INSURANCE | Attending: Internal Medicine | Admitting: Internal Medicine

## 2012-07-03 DIAGNOSIS — E785 Hyperlipidemia, unspecified: Secondary | ICD-10-CM | POA: Diagnosis present

## 2012-07-03 DIAGNOSIS — H9190 Unspecified hearing loss, unspecified ear: Secondary | ICD-10-CM | POA: Diagnosis present

## 2012-07-03 DIAGNOSIS — G9389 Other specified disorders of brain: Secondary | ICD-10-CM

## 2012-07-03 DIAGNOSIS — Z8673 Personal history of transient ischemic attack (TIA), and cerebral infarction without residual deficits: Secondary | ICD-10-CM | POA: Diagnosis present

## 2012-07-03 DIAGNOSIS — I5042 Chronic combined systolic (congestive) and diastolic (congestive) heart failure: Secondary | ICD-10-CM | POA: Diagnosis present

## 2012-07-03 DIAGNOSIS — R930 Abnormal findings on diagnostic imaging of skull and head, not elsewhere classified: Secondary | ICD-10-CM | POA: Diagnosis present

## 2012-07-03 DIAGNOSIS — I429 Cardiomyopathy, unspecified: Secondary | ICD-10-CM | POA: Diagnosis present

## 2012-07-03 DIAGNOSIS — E1122 Type 2 diabetes mellitus with diabetic chronic kidney disease: Secondary | ICD-10-CM | POA: Diagnosis present

## 2012-07-03 DIAGNOSIS — I639 Cerebral infarction, unspecified: Secondary | ICD-10-CM

## 2012-07-03 DIAGNOSIS — E119 Type 2 diabetes mellitus without complications: Secondary | ICD-10-CM

## 2012-07-03 DIAGNOSIS — I509 Heart failure, unspecified: Secondary | ICD-10-CM | POA: Diagnosis present

## 2012-07-03 DIAGNOSIS — Z7982 Long term (current) use of aspirin: Secondary | ICD-10-CM

## 2012-07-03 DIAGNOSIS — R739 Hyperglycemia, unspecified: Secondary | ICD-10-CM

## 2012-07-03 DIAGNOSIS — E1142 Type 2 diabetes mellitus with diabetic polyneuropathy: Secondary | ICD-10-CM | POA: Diagnosis present

## 2012-07-03 DIAGNOSIS — I6350 Cerebral infarction due to unspecified occlusion or stenosis of unspecified cerebral artery: Principal | ICD-10-CM | POA: Diagnosis present

## 2012-07-03 DIAGNOSIS — Z794 Long term (current) use of insulin: Secondary | ICD-10-CM

## 2012-07-03 DIAGNOSIS — Z79899 Other long term (current) drug therapy: Secondary | ICD-10-CM

## 2012-07-03 DIAGNOSIS — E1149 Type 2 diabetes mellitus with other diabetic neurological complication: Secondary | ICD-10-CM

## 2012-07-03 DIAGNOSIS — I10 Essential (primary) hypertension: Secondary | ICD-10-CM | POA: Diagnosis present

## 2012-07-03 DIAGNOSIS — E1140 Type 2 diabetes mellitus with diabetic neuropathy, unspecified: Secondary | ICD-10-CM | POA: Diagnosis present

## 2012-07-03 DIAGNOSIS — I428 Other cardiomyopathies: Secondary | ICD-10-CM | POA: Diagnosis present

## 2012-07-03 HISTORY — DX: Type 2 diabetes mellitus with diabetic neuropathy, unspecified: E11.40

## 2012-07-03 LAB — COMPREHENSIVE METABOLIC PANEL
ALT: 13 U/L (ref 0–53)
CO2: 27 mEq/L (ref 19–32)
Calcium: 9 mg/dL (ref 8.4–10.5)
Chloride: 101 mEq/L (ref 96–112)
GFR calc Af Amer: 82 mL/min — ABNORMAL LOW (ref >90)
GFR calc non Af Amer: 71 mL/min — ABNORMAL LOW (ref >90)
Glucose, Bld: 347 mg/dL — ABNORMAL HIGH (ref 70–99)
Sodium: 138 mEq/L (ref 135–145)
Total Bilirubin: 0.4 mg/dL (ref 0.3–1.2)

## 2012-07-03 LAB — CBC WITH DIFFERENTIAL/PLATELET
Eosinophils Relative: 4 % (ref 0–5)
HCT: 37.2 % — ABNORMAL LOW (ref 39.0–52.0)
Lymphocytes Relative: 43 % (ref 12–46)
Lymphs Abs: 2 10*3/uL (ref 0.7–4.0)
MCV: 81.9 fL (ref 78.0–100.0)
Monocytes Absolute: 0.3 10*3/uL (ref 0.1–1.0)
Neutro Abs: 2.1 10*3/uL (ref 1.7–7.7)
Platelets: 241 10*3/uL (ref 150–400)
RBC: 4.54 MIL/uL (ref 4.22–5.81)
WBC: 4.5 10*3/uL (ref 4.0–10.5)

## 2012-07-03 LAB — DIGOXIN LEVEL: Digoxin Level: 0.6 ng/mL — ABNORMAL LOW (ref 0.8–2.0)

## 2012-07-03 NOTE — ED Provider Notes (Signed)
MSE was initiated and I personally evaluated the patient and placed orders (if any) at  10:52 PM on July 03, 2012.  The patient appears stable so that the remainder of the MSE may be completed by another provider.  56 year old male is brought in because his daughter is worried that he had a stroke. He was last known to be normal 4 days ago. She states that his right eye seems to be more closed andseems twisted and has been sleeping a lot. There's been no focal weakness and no difficulty with speech. Brief neurologic exam shows intact cranial nerves, normal speech, no pronator drift. I do not see any evidence of an acute stroke, and he in any case, he is well outside the timeframe of code stroke and well outside the timeframe for any acute stroke interventions.  Delora Fuel, [place] [phone] [id number]

## 2012-07-03 NOTE — ED Provider Notes (Signed)
History     CSN: [id number]  Arrival date & time 07/03/12  2236   First MD Initiated Contact with Patient 07/03/12 2254      No chief complaint on file.   (Consider location/radiation/quality/duration/timing/severity/associated sxs/prior treatment) HPI Comments: 56 year old male with a history of hypertension, diabetes, cardiomyopathy with an ejection fraction of 40% as of December of 2003 who has had a myocardial infarction in the past presents with a complaint of possible facial droop. The patient denies feeling bad, he has no complaints area and he denies chest pain, shortness of breath, headache, blurred vision, ataxia, weakness, numbness, dysphagia, a fascia or any other complaints. He states that he is here because his wife wanted him to be evaluated for what she feels is facial droop. She states that on Sunday he developed a twisting of his smile on his face, having difficulty closing one of his eyes. He says that he looked in the mirror when he was told that this was abnormal and he did not see what they were talking about. Since that time his spouse says that his symptoms have persisted though they are mild. She also states that he has been excessively tired and she does not feel that he is taking his medications. The patient denies drug abuse, head injury and again has no complaints. The spouse is the primary historian  The history is provided by the patient and the spouse.    Past Medical History  Diagnosis Date  . Dermatitis   . CHF (congestive heart failure)     LV function improved from 2004 to 2008.  Historically, moderately dilated LV with EF 30-40% by 2D echo 08/14/2002.  Mild CAD with severe LV dysfunction by cardiac cath 09/2002.  Normal coronary arteries and normal LV function by cardiac cath 09/19/2006.  A 2-D echo on 04/01/2009 showed mild concentric hypertrophy and normal systolic (LVEF  123456) and doppler C/W with grade 1 diastolic dysfunction.  . Hypertension   .  Diabetes mellitus   . Hyperlipidemia   . Hearing loss in right ear   . Cardiomyopathy     LV function improved from 2004 to 2008.  Historically, moderately dilated LV with EF 30-40% by 2D echo 08/14/2002.  Mild CAD with severe LV dysfunction by cardiac cath 09/2002.  Normal coronary arteries and normal LV function by cardiac cath 09/19/2006.  A 2-D echo on 04/01/2009 showed mild concentric hypertrophy and normal systolic (LVEF  123456) and doppler C/W with grade 1 diastolic dysfunction.    Past Surgical History  Procedure Date  . Colonoscopy   . Polypectomy   . Cardiac catheterization     3 times  . Foot surgery     Family History  Problem Relation Age of Onset  . Colon cancer Sister     History  Substance Use Topics  . Smoking status: Never Smoker   . Smokeless tobacco: Never Used  . Alcohol Use: No      Review of Systems  All other systems reviewed and are negative.    Allergies  Review of patient's allergies indicates no known allergies.  Home Medications   Current Outpatient Rx  Name  Route  Sig  Dispense  Refill  . AMLODIPINE BESYLATE 10 MG PO TABS   Oral   Take 10 mg by mouth daily.         [name] CARVEDILOL 25 MG PO TABS   Oral   Take 25 mg by mouth 2 (two) times daily with a meal.         .  CLONIDINE HCL 0.1 MG PO TABS   Oral   Take 0.3 mg by mouth 2 (two) times daily.         [name] DIGOXIN 0.25 MG PO TABS   Oral   Take 0.25 mg by mouth daily.         . FUROSEMIDE 40 MG PO TABS   Oral   Take 60 mg by mouth daily.         [name] GABAPENTIN 300 MG PO CAPS   Oral   Take 300 mg by mouth daily.         [name] LISINOPRIL 40 MG PO TABS   Oral   Take 40 mg by mouth daily.         [name] METFORMIN HCL 1000 MG PO TABS   Oral   Take 1,000 mg by mouth 2 (two) times daily.         [name] PRAVASTATIN SODIUM 40 MG PO TABS   Oral   Take 40 mg by mouth daily.         . ASPIRIN EC 81 MG PO TBEC   Oral   Take 81 mg by mouth daily.           . INSULIN  GLARGINE 100 UNIT/ML [place] SOLN   Subcutaneous   Inject 10 Units into the skin at bedtime.   9 mL   3   . ADULT MULTIVITAMIN W/MINERALS CH   Oral   Take 1 tablet by mouth daily.             BP 178/93  Pulse 79  Temp 98.1 F (36.7 C) (Oral)  Resp 18  Ht 6' (1.829 m)  Wt 215 lb (97.523 kg)  BMI 29.16 kg/m2  SpO2 98%  Physical Exam  Nursing note and vitals reviewed. Constitutional: He appears well-developed and well-nourished. No distress.  HENT:  Head: Normocephalic and atraumatic.  Mouth/Throat: Oropharynx is clear and moist. No oropharyngeal exudate.       edentulous  Eyes: Conjunctivae normal and EOM are normal. Pupils are equal, round, and reactive to light. Right eye exhibits no discharge. Left eye exhibits no discharge. No scleral icterus.  Neck: Normal range of motion. Neck supple. No JVD present. No thyromegaly present.  Cardiovascular: Normal rate, regular rhythm, normal heart sounds and intact distal pulses.  Exam reveals no gallop and no friction rub.   No murmur heard.      No JVD, no carotid bruits, strong pulses at the radial arteries bilaterally  Pulmonary/Chest: Effort normal and breath sounds normal. No respiratory distress. He has no wheezes. He has no rales.       Clear heart and lung sounds, no increased work of breathing, no respiratory distress  Abdominal: Soft. Bowel sounds are normal. He exhibits no distension and no mass. There is no tenderness.  Musculoskeletal: Normal range of motion. He exhibits no edema and no tenderness.  Lymphadenopathy:    He has no cervical adenopathy.  Neurological: He is alert. Coordination normal.       Speech is clear and goal-directed, cranial nerves III through XII are intact, there is no facial droop, there is 4 had sparing, he is able to close both of his eyes equally, sensation is equal to light touch and pinprick of the face.  Gait is normal, he can heel walk, toe walk, stable gait without ataxia. There is no pronator  drift, he has normal finger-nose-finger, normal heel shin. His sensation to his extremities is intact to  light touch and pin prick and position sense. His memory is intact and he is alert and oriented to person place time and events.  Skin: Skin is warm and dry. No rash noted. No erythema.  Psychiatric: He has a normal mood and affect. His behavior is normal.    ED Course  Procedures (including critical care time)  Labs Reviewed  CBC WITH DIFFERENTIAL - Abnormal; Notable for the following:    HCT 37.2 (*)     All other components within normal limits  COMPREHENSIVE METABOLIC PANEL - Abnormal; Notable for the following:    Glucose, Bld 347 (*)     GFR calc non Af Amer 71 (*)     GFR calc Af Amer 82 (*)     All other components within normal limits  DIGOXIN LEVEL - Abnormal; Notable for the following:    Digoxin Level 0.6 (*)     All other components within normal limits  TROPONIN I  URINALYSIS, ROUTINE W REFLEX MICROSCOPIC  URINE RAPID DRUG SCREEN (HOSP PERFORMED)   Dg Chest 2 View  07/03/2012  *RADIOLOGY REPORT*  Clinical Data: Weakness, history cardiomyopathy, hypertension, diabetes  CHEST - 2 VIEW  Comparison: 08/23/2011  Findings: Upper-normal size of cardiac silhouette. Mediastinal contours and pulmonary vascularity normal. Lungs clear. No pleural effusion or pneumothorax. No acute osseous findings. Endplate spur formation thoracic spine.  IMPRESSION: No acute abnormalities.   Original Report Authenticated By: Lavonia Dana, M.D.    Ct Head Wo Contrast  07/04/2012  *RADIOLOGY REPORT*  Clinical Data: Weakness  CT HEAD WITHOUT CONTRAST  Technique:  Contiguous axial images were obtained from the base of the skull through the vertex without contrast.  Comparison: 10/10/2007 MRI  Findings: Lobular 2.8 x 1.8 cm hypoattenuation within the right basal ganglia, centered at the anterior limb internal capsule and caudate head.  There is mild mass effect on the frontal horn of the right lateral  ventricle.  No intraparenchymal hemorrhage.  No hydrocephalous.  No abnormal extra-axial fluid collection. There/debris within the right sphenoid chamber.  Partial ethmoid air cell opacification.  IMPRESSION: Right basal ganglia hypodensity with mild mass effect, may reflect a subacute infarction or underlying lesion. Recommend MRI for further characterization.  Right sphenoid chamber and scattered ethmoid air cell opacification may reflect acute sinusitis.  Case discussed via telephone with Dr. Roxanne Mins at [id number] p.m. on 07/03/2012.   Original Report Authenticated By: Carlos Levering, M.D.      1. Brain mass   2. Hyperglycemia   3. Hypertension       MDM  At this time the patient has a normal neurologic exam. I do not detect any abnormalities whatsoever. Given the report from his wife he could be possible that he has a mild Bell's palsy, would also consider TIA. For this reason CT scan and laboratory workup has been initiated. His stroke scale is 0, there is no indication for activating code stroke or giving thrombolytic therapy at this time.  Laboratory data reveals hyperglycemia but no other significant problems  Pt reevaluated - still has no sx, still no facial droop, I have personally interpreted the CXR and the CT scan of the brain, there is no significant abnormalities on the chest x-ray including no pulmonary infiltrates or cardiac abnormalities. The CT scan shows a mass or a hypodensity in the right basal ganglia. There is a small amount of mass effect into the ventricle. This could be consistent with the patient's waxing and waning mental status over the  last couple of days and report of facial droop. At this time the patient will need admission to the hospital for further workup and an MRI in the morning. There has been no seizure activity, and again his neurologic exam and mental status is normal at this time. I discussed his care with the hospitalist who will admit.   ED ECG REPORT  I  personally interpreted this EKG   Date: 07/04/2012   Rate: 85  Rhythm: normal sinus rhythm  QRS Axis: normal  Intervals: normal  ST/T Wave abnormalities: nonspecific ST/T changes  Conduction Disutrbances:none  Narrative Interpretation: LVH present  Old EKG Reviewed: no sig changes - has LVH with repol abnormality   Johnna Acosta, MD 07/04/12 [id number]

## 2012-07-03 NOTE — ED Notes (Signed)
Wife at bedside states that the patient appeared to have right sided mouth drooping, states "drawing to the right," noticed this since Sunday.  States he started sleeping more than usual since Friday.  No acute neuro deficit noted at present.

## 2012-07-03 NOTE — ED Notes (Signed)
Wife states that there is a possibility that the patient may be taking his meds incorrectly.

## 2012-07-03 NOTE — ED Notes (Signed)
Daughter says she noticed pts mouth "twisting " when he talks,  Memory problems, and sleeping more than usual.  Pt alert, talking, alert, oriented.

## 2012-07-04 ENCOUNTER — Encounter (HOSPITAL_COMMUNITY): Payer: Self-pay | Admitting: Internal Medicine

## 2012-07-04 ENCOUNTER — Observation Stay (HOSPITAL_COMMUNITY): Payer: PRIVATE HEALTH INSURANCE

## 2012-07-04 DIAGNOSIS — I6350 Cerebral infarction due to unspecified occlusion or stenosis of unspecified cerebral artery: Secondary | ICD-10-CM

## 2012-07-04 DIAGNOSIS — E1149 Type 2 diabetes mellitus with other diabetic neurological complication: Secondary | ICD-10-CM

## 2012-07-04 DIAGNOSIS — Z8673 Personal history of transient ischemic attack (TIA), and cerebral infarction without residual deficits: Secondary | ICD-10-CM | POA: Diagnosis present

## 2012-07-04 DIAGNOSIS — R930 Abnormal findings on diagnostic imaging of skull and head, not elsewhere classified: Secondary | ICD-10-CM

## 2012-07-04 DIAGNOSIS — I10 Essential (primary) hypertension: Secondary | ICD-10-CM

## 2012-07-04 DIAGNOSIS — I639 Cerebral infarction, unspecified: Secondary | ICD-10-CM

## 2012-07-04 DIAGNOSIS — I517 Cardiomegaly: Secondary | ICD-10-CM

## 2012-07-04 DIAGNOSIS — G939 Disorder of brain, unspecified: Secondary | ICD-10-CM

## 2012-07-04 HISTORY — DX: Cerebral infarction, unspecified: I63.9

## 2012-07-04 LAB — GLUCOSE, CAPILLARY
Glucose-Capillary: 166 mg/dL — ABNORMAL HIGH (ref 70–99)
Glucose-Capillary: 197 mg/dL — ABNORMAL HIGH (ref 70–99)
Glucose-Capillary: 143 mg/dL — ABNORMAL HIGH (ref 70–99)
Glucose-Capillary: 155 mg/dL — ABNORMAL HIGH (ref 70–99)

## 2012-07-04 LAB — URINALYSIS, ROUTINE W REFLEX MICROSCOPIC
Bilirubin Urine: NEGATIVE
Ketones, ur: NEGATIVE mg/dL
Nitrite: NEGATIVE
Protein, ur: 30 mg/dL — AB
Specific Gravity, Urine: 1.015 (ref 1.005–1.030)
Urobilinogen, UA: 0.2 mg/dL (ref 0.0–1.0)

## 2012-07-04 LAB — CBC
HCT: 33.4 % — ABNORMAL LOW (ref 39.0–52.0)
Hemoglobin: 11.5 g/dL — ABNORMAL LOW (ref 13.0–17.0)
MCV: 82.3 fL (ref 78.0–100.0)
WBC: 5.3 10*3/uL (ref 4.0–10.5)

## 2012-07-04 LAB — COMPREHENSIVE METABOLIC PANEL
Alkaline Phosphatase: 42 U/L (ref 39–117)
BUN: 19 mg/dL (ref 6–23)
Chloride: 102 mEq/L (ref 96–112)
GFR calc Af Amer: >90 mL/min (ref >90)
GFR calc non Af Amer: >90 mL/min (ref >90)
Glucose, Bld: 198 mg/dL — ABNORMAL HIGH (ref 70–99)
Potassium: 3.4 mEq/L — ABNORMAL LOW (ref 3.5–5.1)
Total Bilirubin: 0.2 mg/dL — ABNORMAL LOW (ref 0.3–1.2)

## 2012-07-04 LAB — URINE MICROSCOPIC-ADD ON

## 2012-07-04 LAB — RAPID URINE DRUG SCREEN, HOSP PERFORMED
Amphetamines: NOT DETECTED
Barbiturates: NOT DETECTED
Tetrahydrocannabinol: NOT DETECTED

## 2012-07-04 LAB — LIPID PANEL: LDL Cholesterol: UNDETERMINED mg/dL (ref 0–99)

## 2012-07-04 LAB — TSH: TSH: 2.782 u[IU]/mL (ref 0.350–4.500)

## 2012-07-04 LAB — HEMOGLOBIN A1C: Hgb A1c MFr Bld: 8.2 % — ABNORMAL HIGH (ref <5.7)

## 2012-07-04 MED ORDER — DIGOXIN 250 MCG PO TABS
0.2500 mg | ORAL_TABLET | Freq: Every day | ORAL | Status: DC
  Administered 2012-07-04 – 2012-07-05 (×2): 0.25 mg via ORAL
  Filled 2012-07-04 (×2): qty 1

## 2012-07-04 MED ORDER — ASPIRIN EC 325 MG PO TBEC
325.0000 mg | DELAYED_RELEASE_TABLET | Freq: Every day | ORAL | Status: DC
  Administered 2012-07-04: 325 mg via ORAL
  Filled 2012-07-04: qty 1

## 2012-07-04 MED ORDER — INSULIN ASPART 100 UNIT/ML ~~LOC~~ SOLN
0.0000 [IU] | Freq: Three times a day (TID) | SUBCUTANEOUS | Status: DC
Start: 2012-07-04 — End: 2012-07-05
  Administered 2012-07-04: 1 [IU] via SUBCUTANEOUS
  Administered 2012-07-04: 2 [IU] via SUBCUTANEOUS
  Administered 2012-07-04: 5 [IU] via SUBCUTANEOUS
  Administered 2012-07-05: 2 [IU] via SUBCUTANEOUS

## 2012-07-04 MED ORDER — SIMVASTATIN 20 MG PO TABS
20.0000 mg | ORAL_TABLET | Freq: Every day | ORAL | Status: DC
  Administered 2012-07-04: 20 mg via ORAL
  Filled 2012-07-04: qty 1

## 2012-07-04 MED ORDER — ONDANSETRON HCL 4 MG/2ML IJ SOLN: 4.0000 mg | Freq: Four times a day (QID) | INTRAMUSCULAR | Status: DC | PRN

## 2012-07-04 MED ORDER — HYDROMORPHONE HCL PF 1 MG/ML IJ SOLN: 0.5000 mg | INTRAMUSCULAR | Status: DC | PRN

## 2012-07-04 MED ORDER — INSULIN ASPART 100 UNIT/ML ~~LOC~~ SOLN: 0.0000 [IU] | Freq: Every day | SUBCUTANEOUS | Status: DC

## 2012-07-04 MED ORDER — ACETAMINOPHEN 325 MG PO TABS: 650.0000 mg | ORAL_TABLET | Freq: Four times a day (QID) | ORAL | Status: DC | PRN

## 2012-07-04 MED ORDER — NIACIN ER 500 MG PO CPCR
500.0000 mg | ORAL_CAPSULE | Freq: Every day | ORAL | Status: DC
  Administered 2012-07-04: 500 mg via ORAL
  Filled 2012-07-04 (×2): qty 1

## 2012-07-04 MED ORDER — FLEET ENEMA 7-19 GM/118ML RE ENEM: 1.0000 | ENEMA | Freq: Once | RECTAL | Status: AC | PRN

## 2012-07-04 MED ORDER — POTASSIUM CHLORIDE CRYS ER 20 MEQ PO TBCR
40.0000 meq | EXTENDED_RELEASE_TABLET | Freq: Once | ORAL | Status: AC
  Administered 2012-07-04: 40 meq via ORAL
  Filled 2012-07-04: qty 2

## 2012-07-04 MED ORDER — METFORMIN HCL 500 MG PO TABS
1000.0000 mg | ORAL_TABLET | Freq: Two times a day (BID) | ORAL | Status: DC
Start: 2012-07-04 — End: 2012-07-05
  Administered 2012-07-04 – 2012-07-05 (×2): 1000 mg via ORAL
  Filled 2012-07-04 (×2): qty 2

## 2012-07-04 MED ORDER — BISACODYL 5 MG PO TBEC: 5.0000 mg | DELAYED_RELEASE_TABLET | Freq: Every day | ORAL | Status: DC | PRN

## 2012-07-04 MED ORDER — LISINOPRIL 10 MG PO TABS
40.0000 mg | ORAL_TABLET | Freq: Every day | ORAL | Status: DC
  Administered 2012-07-04 – 2012-07-05 (×2): 40 mg via ORAL
  Filled 2012-07-04 (×2): qty 4

## 2012-07-04 MED ORDER — CLONIDINE HCL 0.1 MG PO TABS
0.3000 mg | ORAL_TABLET | Freq: Two times a day (BID) | ORAL | Status: DC
  Administered 2012-07-04 – 2012-07-05 (×3): 0.3 mg via ORAL
  Filled 2012-07-04 (×3): qty 1

## 2012-07-04 MED ORDER — GADOBENATE DIMEGLUMINE 529 MG/ML IV SOLN
20.0000 mL | Freq: Once | INTRAVENOUS | Status: AC | PRN
  Administered 2012-07-04: 20 mL via INTRAVENOUS

## 2012-07-04 MED ORDER — CARVEDILOL 12.5 MG PO TABS
25.0000 mg | ORAL_TABLET | Freq: Two times a day (BID) | ORAL | Status: DC
  Administered 2012-07-04 – 2012-07-05 (×3): 25 mg via ORAL
  Filled 2012-07-04 (×3): qty 2

## 2012-07-04 MED ORDER — ONDANSETRON HCL 4 MG PO TABS: 4.0000 mg | ORAL_TABLET | Freq: Four times a day (QID) | ORAL | Status: DC | PRN

## 2012-07-04 MED ORDER — AMLODIPINE BESYLATE 5 MG PO TABS
10.0000 mg | ORAL_TABLET | Freq: Every day | ORAL | Status: DC
  Administered 2012-07-04 – 2012-07-05 (×2): 10 mg via ORAL
  Filled 2012-07-04 (×2): qty 2

## 2012-07-04 MED ORDER — HYDRALAZINE HCL 20 MG/ML IJ SOLN: 10.0000 mg | Freq: Four times a day (QID) | INTRAMUSCULAR | Status: DC | PRN

## 2012-07-04 MED ORDER — ONDANSETRON HCL 4 MG/2ML IJ SOLN: 4.0000 mg | Freq: Three times a day (TID) | INTRAMUSCULAR | Status: DC | PRN

## 2012-07-04 MED ORDER — POTASSIUM CHLORIDE IN NACL 20-0.9 MEQ/L-% IV SOLN
INTRAVENOUS | Status: DC
  Administered 2012-07-04: 14:00:00 via INTRAVENOUS
  Administered 2012-07-04: 1000 mL via INTRAVENOUS

## 2012-07-04 MED ORDER — INSULIN GLARGINE 100 UNIT/ML ~~LOC~~ SOLN
15.0000 [IU] | Freq: Every day | SUBCUTANEOUS | Status: DC
  Administered 2012-07-04 (×2): 15 [IU] via SUBCUTANEOUS

## 2012-07-04 MED ORDER — SODIUM CHLORIDE 0.9 % IJ SOLN
3.0000 mL | Freq: Two times a day (BID) | INTRAMUSCULAR | Status: DC
  Administered 2012-07-04: 3 mL via INTRAVENOUS

## 2012-07-04 MED ORDER — TRAZODONE HCL 50 MG PO TABS
25.0000 mg | ORAL_TABLET | Freq: Every evening | ORAL | Status: DC | PRN
  Administered 2012-07-04: 25 mg via ORAL
  Filled 2012-07-04: qty 1

## 2012-07-04 MED ORDER — ACETAMINOPHEN 650 MG RE SUPP: 650.0000 mg | Freq: Four times a day (QID) | RECTAL | Status: DC | PRN

## 2012-07-04 MED ORDER — POLYETHYLENE GLYCOL 3350 17 G PO PACK: 17.0000 g | PACK | Freq: Every day | ORAL | Status: DC | PRN

## 2012-07-04 MED ORDER — CLOPIDOGREL BISULFATE 75 MG PO TABS
75.0000 mg | ORAL_TABLET | Freq: Every day | ORAL | Status: DC
  Administered 2012-07-05: 75 mg via ORAL
  Filled 2012-07-04: qty 1

## 2012-07-04 MED ORDER — GABAPENTIN 300 MG PO CAPS
300.0000 mg | ORAL_CAPSULE | Freq: Every day | ORAL | Status: DC
  Administered 2012-07-04 – 2012-07-05 (×2): 300 mg via ORAL
  Filled 2012-07-04 (×2): qty 1

## 2012-07-04 NOTE — Progress Notes (Signed)
Occupational Therapy Screen  OT orders received. Patient's chart reviewed. PT eval states that patient is independent with mobility and is not showing any deficits secondary to CVA. Spoke with patient and discussed occupational therapy and asked if he was having any difficulties since his CVA. Patient did not voice any concerns and does not see any need for occupational therapy at this time. OT will sign off.  Ailene Ravel, OTR/L 07/04/12 2:32PM

## 2012-07-04 NOTE — Care Management Note (Signed)
    Page 1 of 1   07/05/2012     2:34:16 PM   CARE MANAGEMENT NOTE 07/05/2012  Patient:  Charles Hart, Charles Hart   Account Number:  [id number]  Date Initiated:  07/04/2012  Documentation initiated by:  Claretha Cooper  Subjective/Objective Assessment:   Pt admitted from home where he lives with his wife. Plans to return home at discharge     Action/Plan:   Anticipated DC Date:  07/05/2012   Anticipated DC Plan:  [place]  CM consult      Choice offered to / List presented to:             Status of service:  Completed, signed off Medicare Important Message given?   (If response is "NO", the following Medicare IM given date fields will be blank) Date Medicare IM given:   Date Additional Medicare IM given:    Discharge Disposition:  HOME/SELF CARE  Per UR Regulation:    If discussed at Long Length of Stay Meetings, dates discussed:    Comments:  07/04/12 [place]

## 2012-07-04 NOTE — Progress Notes (Signed)
*  PRELIMINARY RESULTS* Echocardiogram 2D Echocardiogram has been performed.  Charles Hart 07/04/2012, 11:38 AM

## 2012-07-04 NOTE — ED Notes (Signed)
Dr. Campbell at bedside for evaluation.

## 2012-07-04 NOTE — H&P (Signed)
[facility] History and Physical  Hrishikesh Somma  [id number]  DOB: 1956/04/19   DOA: 07/04/2012   PCP:   Axel Filler, MD   Chief Complaint:  Weakness right side of face for 2 days  HPI: Charles Hart Sr. is an 56 y.o. male.  African American gentleman with a history of diabetes, hypertension, hyperlipidemia, is brought into the emergency room by his wife will assist that the right side of his face has not been looking right the past 2 days and that he also seems to be having some mild memory impairment. Patient denies any problems at all feels he is completely at his baseline, but reports he has noted no changes.  Physical examination in the emergency room did not reveal any abnormalities however a CT scan of the brain revealed an abnormality in the basal ganglia differential includes subacute stroke or tumor; there is mass effect on the ventricle and the hospitalist service was called to assist with management.  Is no history of visual disturbance not speech or swallowing impairment. There is been no fever or other systemic symptoms  His father died from a ruptured cerebral aneurysm; he has never been checked for cerebral aneurysm.  Rewiew of Systems:  He has had unexplained hearing impairment right ear for some years  All systems negative except as marked bold or noted in the HPI;  Constitutional: Negative for malaise, fever and chills. ;  Eyes: Negative for eye pain, redness and discharge. ;  ENMT: Negative for ear pain, hoarseness, nasal congestion, sinus pressure and sore throat. ;  Cardiovascular: Negative for chest pain, palpitations, diaphoresis, dyspnea and peripheral edema. ;  Respiratory: Negative for cough, hemoptysis, wheezing and stridor. ;  Gastrointestinal: Negative for nausea, vomiting, diarrhea, constipation, abdominal pain, melena, blood in stool, hematemesis, jaundice and rectal bleeding. unusual weight loss..   Genitourinary: Negative for  frequency, dysuria, incontinence,flank pain and hematuria; Musculoskeletal: Negative for back pain and neck pain. Negative for swelling and trauma.;  Skin: . Negative for pruritus, rash, abrasions, bruising and skin lesion.; ulcerations Neuro: Negative for headache, lightheadedness and neck stiffness. Negative for weakness, altered level of consciousness , altered mental status, extremity weakness, burning feet, involuntary movement, seizure and syncope.  Psych: negative for anxiety, depression, insomnia, tearfulness, panic attacks, hallucinations, paranoia, suicidal or homicidal ideation    Past Medical History  Diagnosis Date  . Dermatitis   . CHF (congestive heart failure)     LV function improved from 2004 to 2008.  Historically, moderately dilated LV with EF 30-40% by 2D echo 08/14/2002.  Mild CAD with severe LV dysfunction by cardiac cath 09/2002.  Normal coronary arteries and normal LV function by cardiac cath 09/19/2006.  A 2-D echo on 04/01/2009 showed mild concentric hypertrophy and normal systolic (LVEF  123456) and doppler C/W with grade 1 diastolic dysfunction.  . Hypertension   . Diabetes mellitus   . Hyperlipidemia   . Hearing loss in right ear   . Cardiomyopathy     LV function improved from 2004 to 2008.  Historically, moderately dilated LV with EF 30-40% by 2D echo 08/14/2002.  Mild CAD with severe LV dysfunction by cardiac cath 09/2002.  Normal coronary arteries and normal LV function by cardiac cath 09/19/2006.  A 2-D echo on 04/01/2009 showed mild concentric hypertrophy and normal systolic (LVEF  123456) and doppler C/W with grade 1 diastolic dysfunction.  . DM neuropathy, painful     Past Surgical History  Procedure Date  . Colonoscopy   .  Polypectomy   . Cardiac catheterization     3 times  . Foot surgery     Medications:  HOME MEDS: Prior to Admission medications   Medication Sig Start Date End Date Taking? Authorizing Provider  amLODipine (NORVASC) 10 MG tablet  Take 10 mg by mouth daily.   Yes Historical Provider, MD  carvedilol (COREG) 25 MG tablet Take 25 mg by mouth 2 (two) times daily with a meal.   Yes Historical Provider, MD  cloNIDine (CATAPRES) 0.1 MG tablet Take 0.3 mg by mouth 2 (two) times daily.   Yes Historical Provider, MD  digoxin (LANOXIN) 0.25 MG tablet Take 0.25 mg by mouth daily.   Yes Historical Provider, MD  furosemide (LASIX) 40 MG tablet Take 60 mg by mouth daily.   Yes Historical Provider, MD  gabapentin (NEURONTIN) 300 MG capsule Take 300 mg by mouth daily.   Yes Historical Provider, MD  lisinopril (PRINIVIL,ZESTRIL) 40 MG tablet Take 40 mg by mouth daily.   Yes Historical Provider, MD  metFORMIN (GLUCOPHAGE) 1000 MG tablet Take 1,000 mg by mouth 2 (two) times daily.   Yes Historical Provider, MD  pravastatin (PRAVACHOL) 40 MG tablet Take 40 mg by mouth daily.   Yes Historical Provider, MD  aspirin EC 81 MG tablet Take 81 mg by mouth daily.      Historical Provider, MD  insulin glargine (LANTUS SOLOSTAR) 100 UNIT/ML injection Inject 10 Units into the skin at bedtime. 04/18/12 04/18/13  Axel Filler, MD  Multiple Vitamin (MULITIVITAMIN WITH MINERALS) TABS Take 1 tablet by mouth daily.      Historical Provider, MD     Allergies:  No Known Allergies  Social History:   reports that he has never smoked. He has never used smokeless tobacco. He reports that he does not drink alcohol or use illicit drugs.  Family History: Family History  Problem Relation Age of Onset  . Colon cancer Sister   . Aneurysm Father 2    died of rupture     Physical Exam: Filed Vitals:   07/03/12 2240 07/03/12 2300 07/04/12 0133 07/04/12 0139  BP: 182/77 178/93 144/69   Pulse: 76 79 79   Temp: 98.1 F (36.7 C)  97.9 F (36.6 C)   TempSrc: Oral  Oral   Resp: 18  20   Height: 6' (1.829 m)   6' (1.829 m)  Weight: 97.523 kg (215 lb)   101.606 kg (224 lb)  SpO2: 99% 98% 94%    Blood pressure 144/69, pulse 79, temperature 97.9 F (36.6 C),  temperature source Oral, resp. rate 20, height 6' (1.829 m), weight 101.606 kg (224 lb), SpO2 94.00%.  GEN:  Pleasant middle-aged African American gentleman lying in the stretcher in no acute distress; cooperative with exam PSYCH:  alert and oriented x4; does not appear anxious or depressed; affect is appropriate. HEENT: Mucous membranes pink and anicteric; PERRLA; EOM intact; no cervical lymphadenopathy nor thyromegaly ; no JVD; Breasts:: Not examined CHEST WALL: No tenderness CHEST: Normal respiration, clear to auscultation bilaterally HEART: Regular rate and rhythm; no murmurs rubs or gallops BACK: No kyphosis or scoliosis; no CVA tenderness ABDOMEN: Obese, soft non-tender; no masses, no organomegaly, normal abdominal bowel sounds; no intertriginous candida. Rectal Exam: Not done EXTREMITIES:  age-appropriate arthropathy of the hands and knees; no edema; no ulcerations. Genitalia: not examined PULSES: 2+ and symmetric SKIN: Normal hydration no rash or ulceration CNS: Cranial nerves 2-12 grossly intact; he has a very mild ptosis of the right  eye; no other focal lateralizing neurologic deficit   Labs on Admission:  Basic Metabolic Panel:  Lab 99991111 2313  NA 138  K 3.7  CL 101  CO2 27  GLUCOSE 347*  BUN 21  CREATININE 1.14  CALCIUM 9.0  MG --  PHOS --   Liver Function Tests:  Lab 07/03/12 2313  AST 13  ALT 13  ALKPHOS 43  BILITOT 0.4  PROT 6.4  ALBUMIN 3.6   No results found for this basename: LIPASE:5,AMYLASE:5 in the last 168 hours No results found for this basename: AMMONIA:5 in the last 168 hours CBC:  Lab 07/03/12 2313  WBC 4.5  NEUTROABS 2.1  HGB 13.0  HCT 37.2*  MCV 81.9  PLT 241   Cardiac Enzymes:  Lab 07/03/12 2313  CKTOTAL --  CKMB --  CKMBINDEX --  TROPONINI <0.30   BNP: No components found with this basename: POCBNP:5 D-dimer: No components found with this basename: D-DIMER:5 CBG: No results found for this basename: GLUCAP:5 in the  last 168 hours  Radiological Exams on Admission: Dg Chest 2 View  07/03/2012  *RADIOLOGY REPORT*  Clinical Data: Weakness, history cardiomyopathy, hypertension, diabetes  CHEST - 2 VIEW  Comparison: 08/23/2011  Findings: Upper-normal size of cardiac silhouette. Mediastinal contours and pulmonary vascularity normal. Lungs clear. No pleural effusion or pneumothorax. No acute osseous findings. Endplate spur formation thoracic spine.  IMPRESSION: No acute abnormalities.   Original Report Authenticated By: Lavonia Dana, M.D.    Ct Head Wo Contrast  07/04/2012  *RADIOLOGY REPORT*  Clinical Data: Weakness  CT HEAD WITHOUT CONTRAST  Technique:  Contiguous axial images were obtained from the base of the skull through the vertex without contrast.  Comparison: 10/10/2007 MRI  Findings: Lobular 2.8 x 1.8 cm hypoattenuation within the right basal ganglia, centered at the anterior limb internal capsule and caudate head.  There is mild mass effect on the frontal horn of the right lateral ventricle.  No intraparenchymal hemorrhage.  No hydrocephalous.  No abnormal extra-axial fluid collection. There/debris within the right sphenoid chamber.  Partial ethmoid air cell opacification.  IMPRESSION: Right basal ganglia hypodensity with mild mass effect, may reflect a subacute infarction or underlying lesion. Recommend MRI for further characterization.  Right sphenoid chamber and scattered ethmoid air cell opacification may reflect acute sinusitis.  Case discussed via telephone with Dr. Roxanne Mins at [id number] p.m. on 07/03/2012.   Original Report Authenticated By: Carlos Levering, M.D.     EKG: Independently reviewed.    Assessment/Plan Present on Admission:   . Abnormal CT scan, head . Brain mass . Hypertension . Hyperglycemia  . DIABETES MELLITUS, TYPE II . DIABETIC PERIPHERAL NEUROPATHY . HYPERTENSION . HYPERLIPIDEMIA . CONGESTIVE HEART FAILURE . CARDIOMYOPATHY . HEARING LOSS, RIGHT EAR    PLAN: Bring this  gentleman on observation with a few to getting an MRI done later this morning. Will not give heparin for DVT prophylaxis in case this turns out to be an aneurysm, however will go ahead and give aspirin.  We'll hold his amlodipine and his Lasix while we hydrate him in case this is in fact a stroke; but we will continue his carvedilol and his clonidine.  Assume his MRI results are available his orders will need to be redressed  He may benefit from a neurology consult eventually  Other plans as per orders.  Code Status: FULL CODE  Family Communication: Wife present for the entire into the examination Disposition Plan: Pending on the results of MRI/MRA   Yanice Maqueda  [facility] Pager [phone]   07/04/2012, 2:03 AM

## 2012-07-04 NOTE — Progress Notes (Signed)
UR Chart Review Completed  

## 2012-07-04 NOTE — Progress Notes (Signed)
Pt. Taken to MRI via W/C.

## 2012-07-04 NOTE — Evaluation (Signed)
Physical Therapy Evaluation Patient Details Name: LORINZO MALLIA Sr. MRN: [name] DOB: [birth year] Today's Date: 07/04/2012 Time: [id number] PT Time Calculation (min): 33 min  PT Assessment / Plan / Recommendation Clinical Impression  Pt was seen for eval and found to have no problems with strength, balance or coordination.    PT Assessment  Patent does not need any further PT services    Follow Up Recommendations  No PT follow up    Does the patient have the potential to tolerate intense rehabilitation      Barriers to Discharge        Equipment Recommendations  None recommended by PT    Recommendations for Other Services     Frequency      Precautions / Restrictions Precautions Precautions: None Restrictions Weight Bearing Restrictions: No   Pertinent Vitals/Pain       Mobility  Bed Mobility Bed Mobility: Supine to Sit;Sit to Supine Supine to Sit: 7: Independent Sit to Supine: 7: Independent Transfers Transfers: Sit to Stand;Stand to Sit Sit to Stand: 7: Independent Stand to Sit: 7: Independent Ambulation/Gait Ambulation/Gait Assistance: 7: Independent Ambulation Distance (Feet): 300 Feet Assistive device: None Gait Pattern: Within Functional Limits Gait velocity: normal Stairs: Yes Stairs Assistance: 7: Independent Stair Management Technique: One rail Left;Forwards;Alternating pattern Number of Stairs: 4  Wheelchair Mobility Wheelchair Mobility: No    Shoulder Instructions     Exercises     PT Diagnosis:    PT Problem List:   PT Treatment Interventions:     PT Goals    Visit Information  Last PT Received On: 07/04/12    Subjective Data  Subjective: I was even feeling better yesterday...still feel fine Patient Stated Goal: go home   Prior [place] Lives With: Spouse Available Help at Discharge: Family;Available 24 hours/day Type of Home: House Home Access: Stairs to enter [employer] of Steps: full  flight Entrance Stairs-Rails: Right Home Layout: One level Home Adaptive Equipment: None Prior Function Level of Independence: Independent Able to Take Stairs?: Yes Driving: Yes Vocation: Retired Corporate investment banker: No difficulties    Cognition       Extremity/Trunk Assessment Right Lower Extremity Assessment RLE ROM/Strength/Tone: Within functional levels RLE Sensation: WFL - Light Touch RLE Coordination: WFL - gross motor Left Lower Extremity Assessment LLE ROM/Strength/Tone: Within functional levels LLE Sensation: WFL - Light Touch LLE Coordination: WFL - gross/fine motor Trunk Assessment Trunk Assessment: Normal   Balance Balance Balance Assessed: Yes High Level Balance High Level Balance Activites: Side stepping;Backward walking;Direction changes;Turns;Sudden stops;Head turns High Level Balance Comments: no LOB  End of Session PT - End of Session Equipment Utilized During Treatment: Gait belt Activity Tolerance: Patient tolerated treatment well Patient left: in bed;with call bell/phone within reach Nurse Communication: Mobility status  GP     Sable Feil 07/04/2012, 10:30 AM

## 2012-07-04 NOTE — ED Notes (Signed)
Patient transported to admission unit.

## 2012-07-04 NOTE — Progress Notes (Signed)
Patient was admitted earlier this morning by Dr. Megan Salon.  Patient seen and examined, database reviewed  He was admitted for transient right facial droop as well as increasing somnolence. Since arrival to the hospital, his symptoms have completely resolved. He is requesting to be discharged home today. CT scan of the head in the ER showed a questionable new CVA versus intracranial mass. MRI is confirmed acute CVA. Currently his neurologic exam is unremarkable. Patient was taking aspirin on a daily basis prior to this event. He has multiple risk factors including diabetes, hypertension, hyperlipidemia. Triglycerides were found to be over 600 fasting.  2-D echocardiogram has been done today without any acute abnormalities. Carotid Dopplers are currently pending.  We will change his aspirin to Plavix. Continue statin and start him on niacin. Continue his home antihypertensive regimen, use hydralazine when necessary. We'll request a neurology consultation. Possible discharge home tomorrow when workup is complete.

## 2012-07-05 DIAGNOSIS — I6350 Cerebral infarction due to unspecified occlusion or stenosis of unspecified cerebral artery: Principal | ICD-10-CM

## 2012-07-05 DIAGNOSIS — E119 Type 2 diabetes mellitus without complications: Secondary | ICD-10-CM

## 2012-07-05 MED ORDER — NIACIN ER 500 MG PO CPCR: 500.0000 mg | ORAL_CAPSULE | Freq: Every day | ORAL | Status: DC

## 2012-07-05 MED ORDER — HYDRALAZINE HCL 25 MG PO TABS: 25.0000 mg | ORAL_TABLET | Freq: Three times a day (TID) | ORAL | Status: DC

## 2012-07-05 MED ORDER — CLOPIDOGREL BISULFATE 75 MG PO TABS: 75.0000 mg | ORAL_TABLET | Freq: Every day | ORAL | Status: DC

## 2012-07-05 NOTE — Consult Note (Signed)
Bacon A. Merlene Laughter, MD     [email or web address]          Charles THELEN Sr. is an 56 y.o. male.  HPI:   Past Medical History  Diagnosis Date  . Dermatitis   . CHF (congestive heart failure)     LV function improved from 2004 to 2008.  Historically, moderately dilated LV with EF 30-40% by 2D echo 08/14/2002.  Mild CAD with severe LV dysfunction by cardiac cath 09/2002.  Normal coronary arteries and normal LV function by cardiac cath 09/19/2006.  A 2-D echo on 04/01/2009 showed mild concentric hypertrophy and normal systolic (LVEF  123456) and doppler C/W with grade 1 diastolic dysfunction.  . Hypertension   . Diabetes mellitus   . Hyperlipidemia   . Hearing loss in right ear   . Cardiomyopathy     LV function improved from 2004 to 2008.  Historically, moderately dilated LV with EF 30-40% by 2D echo 08/14/2002.  Mild CAD with severe LV dysfunction by cardiac cath 09/2002.  Normal coronary arteries and normal LV function by cardiac cath 09/19/2006.  A 2-D echo on 04/01/2009 showed mild concentric hypertrophy and normal systolic (LVEF  123456) and doppler C/W with grade 1 diastolic dysfunction.  . DM neuropathy, painful     Past Surgical History  Procedure Date  . Colonoscopy   . Polypectomy   . Cardiac catheterization     3 times  . Foot surgery     Family History  Problem Relation Age of Onset  . Colon cancer Sister   . Aneurysm Father 41    died of rupture    Social History:  reports that he has never smoked. He has never used smokeless tobacco. He reports that he does not drink alcohol or use illicit drugs.  Allergies: No Known Allergies Prior to Admission medications   Medication Sig Start Date End Date Taking? Authorizing Provider  amLODipine (NORVASC) 10 MG tablet Take 10 mg by mouth daily.   Yes Historical Provider, MD  aspirin EC 81 MG tablet Take 81 mg by mouth daily.     Yes Historical Provider, MD  carvedilol (COREG) 25 MG tablet Take 25 mg by  mouth 2 (two) times daily with a meal.   Yes Historical Provider, MD  clobetasol cream (TEMOVATE) [id number] % Apply 1 application topically 2 (two) times daily.   Yes Historical Provider, MD  cloNIDine (CATAPRES) 0.1 MG tablet Take 0.3 mg by mouth 2 (two) times daily.   Yes Historical Provider, MD  digoxin (LANOXIN) 0.25 MG tablet Take 0.25 mg by mouth daily.   Yes Historical Provider, MD  furosemide (LASIX) 40 MG tablet Take 60 mg by mouth daily.   Yes Historical Provider, MD  gabapentin (NEURONTIN) 300 MG capsule Take 300 mg by mouth daily.   Yes Historical Provider, MD  insulin glargine (LANTUS) 100 UNIT/ML injection Inject 15 Units into the skin at bedtime.   Yes Historical Provider, MD  lisinopril (PRINIVIL,ZESTRIL) 40 MG tablet Take 40 mg by mouth daily.   Yes Historical Provider, MD  metFORMIN (GLUCOPHAGE) 1000 MG tablet Take 1,000 mg by mouth 2 (two) times daily.   Yes Historical Provider, MD  Multiple Vitamin (MULITIVITAMIN WITH MINERALS) TABS Take 1 tablet by mouth daily.     Yes Historical Provider, MD  pravastatin (PRAVACHOL) 40 MG tablet Take 40 mg by mouth daily.   Yes Historical Provider, MD    Medications:  Scheduled Meds:   . amLODipine  10 mg  Oral Daily  . carvedilol  25 mg Oral BID WC  . cloNIDine  0.3 mg Oral BID  . clopidogrel  75 mg Oral Q breakfast  . digoxin  0.25 mg Oral Daily  . gabapentin  300 mg Oral Daily  . insulin aspart  0-5 Units Subcutaneous QHS  . insulin aspart  0-9 Units Subcutaneous TID WC  . insulin glargine  15 Units Subcutaneous QHS  . lisinopril  40 mg Oral Daily  . metFORMIN  1,000 mg Oral BID  . niacin  500 mg Oral QHS  . [COMPLETED] potassium chloride  40 mEq Oral Once  . simvastatin  20 mg Oral q1800  . sodium chloride  3 mL Intravenous Q12H  . [DISCONTINUED] aspirin EC  325 mg Oral Daily   Continuous Infusions:   . 0.9 % NaCl with KCl 20 mEq / L 100 mL/hr at 07/04/12 1819   PRN Meds:.acetaminophen, acetaminophen, bisacodyl, [COMPLETED]  gadobenate dimeglumine, hydrALAZINE, HYDROmorphone (DILAUDID) injection, ondansetron (ZOFRAN) IV, ondansetron, polyethylene glycol, [EXPIRED] sodium phosphate, traZODone   Results for orders placed during the hospital encounter of 07/03/12 (from the past 48 hour(s))  CBC WITH DIFFERENTIAL     Status: Abnormal   Collection Time   07/03/12 11:13 PM      Component Value Range Comment   WBC 4.5  4.0 - 10.5 K/uL    RBC 4.54  4.22 - 5.81 MIL/uL    Hemoglobin 13.0  13.0 - 17.0 g/dL    HCT 37.2 (*) 39.0 - 52.0 %    MCV 81.9  78.0 - 100.0 fL    MCH 28.6  26.0 - 34.0 pg    MCHC 34.9  30.0 - 36.0 g/dL    RDW 12.4  11.5 - 15.5 %    Platelets 241  150 - 400 K/uL    Neutrophils Relative 46  43 - 77 %    Neutro Abs 2.1  1.7 - 7.7 K/uL    Lymphocytes Relative 43  12 - 46 %    Lymphs Abs 2.0  0.7 - 4.0 K/uL    Monocytes Relative 7  3 - 12 %    Monocytes Absolute 0.3  0.1 - 1.0 K/uL    Eosinophils Relative 4  0 - 5 %    Eosinophils Absolute 0.2  0.0 - 0.7 K/uL    Basophils Relative 0  0 - 1 %    Basophils Absolute 0.0  0.0 - 0.1 K/uL   COMPREHENSIVE METABOLIC PANEL     Status: Abnormal   Collection Time   07/03/12 11:13 PM      Component Value Range Comment   Sodium 138  135 - 145 mEq/L    Potassium 3.7  3.5 - 5.1 mEq/L    Chloride 101  96 - 112 mEq/L    CO2 27  19 - 32 mEq/L    Glucose, Bld 347 (*) 70 - 99 mg/dL    BUN 21  6 - 23 mg/dL    Creatinine, Ser 1.14  0.50 - 1.35 mg/dL    Calcium 9.0  8.4 - 10.5 mg/dL    Total Protein 6.4  6.0 - 8.3 g/dL    Albumin 3.6  3.5 - 5.2 g/dL    AST 13  0 - 37 U/L    ALT 13  0 - 53 U/L    Alkaline Phosphatase 43  39 - 117 U/L    Total Bilirubin 0.4  0.3 - 1.2 mg/dL    GFR calc  non Af Amer 71 (*) >90 mL/min    GFR calc Af Amer 82 (*) >90 mL/min   TROPONIN I     Status: Normal   Collection Time   07/03/12 11:13 PM      Component Value Range Comment   Troponin I <0.30  <0.30 ng/mL   DIGOXIN LEVEL     Status: Abnormal   Collection Time   07/03/12  11:13 PM      Component Value Range Comment   Digoxin Level 0.6 (*) 0.8 - 2.0 ng/mL   URINALYSIS, ROUTINE W REFLEX MICROSCOPIC     Status: Abnormal   Collection Time   07/03/12 11:46 PM      Component Value Range Comment   Color, Urine YELLOW  YELLOW    APPearance CLEAR  CLEAR    Specific Gravity, Urine 1.015  1.005 - 1.030    pH 6.0  5.0 - 8.0    Glucose, UA >1000 (*) NEGATIVE mg/dL    Hgb urine dipstick SMALL (*) NEGATIVE    Bilirubin Urine NEGATIVE  NEGATIVE    Ketones, ur NEGATIVE  NEGATIVE mg/dL    Protein, ur 30 (*) NEGATIVE mg/dL    Urobilinogen, UA 0.2  0.0 - 1.0 mg/dL    Nitrite NEGATIVE  NEGATIVE    Leukocytes, UA NEGATIVE  NEGATIVE   URINE RAPID DRUG SCREEN (HOSP PERFORMED)     Status: Normal   Collection Time   07/03/12 11:46 PM      Component Value Range Comment   Opiates NONE DETECTED  NONE DETECTED    Cocaine NONE DETECTED  NONE DETECTED    Benzodiazepines NONE DETECTED  NONE DETECTED    Amphetamines NONE DETECTED  NONE DETECTED    Tetrahydrocannabinol NONE DETECTED  NONE DETECTED    Barbiturates NONE DETECTED  NONE DETECTED   URINE MICROSCOPIC-ADD ON     Status: Normal   Collection Time   07/03/12 11:46 PM      Component Value Range Comment   Squamous Epithelial / LPF RARE  RARE    RBC / HPF 0-2  <3 RBC/hpf    Bacteria, UA RARE  RARE   TSH     Status: Normal   Collection Time   07/04/12  1:42 AM      Component Value Range Comment   TSH 2.782  0.350 - 4.500 uIU/mL   HEMOGLOBIN A1C     Status: Abnormal   Collection Time   07/04/12  1:55 AM      Component Value Range Comment   Hemoglobin A1C 8.2 (*) <5.7 %    Mean Plasma Glucose 189 (*) <117 mg/dL   GLUCOSE, CAPILLARY     Status: Abnormal   Collection Time   07/04/12  2:18 AM      Component Value Range Comment   Glucose-Capillary 166 (*) 70 - 99 mg/dL   CBC     Status: Abnormal   Collection Time   07/04/12  5:08 AM      Component Value Range Comment   WBC 5.3  4.0 - 10.5 K/uL    RBC 4.06 (*) 4.22  - 5.81 MIL/uL    Hemoglobin 11.5 (*) 13.0 - 17.0 g/dL    HCT 33.4 (*) 39.0 - 52.0 %    MCV 82.3  78.0 - 100.0 fL    MCH 28.3  26.0 - 34.0 pg    MCHC 34.4  30.0 - 36.0 g/dL    RDW 12.4  11.5 - 15.5 %  Platelets 253  150 - 400 K/uL   COMPREHENSIVE METABOLIC PANEL     Status: Abnormal   Collection Time   07/04/12  5:08 AM      Component Value Range Comment   Sodium 139  135 - 145 mEq/L    Potassium 3.4 (*) 3.5 - 5.1 mEq/L    Chloride 102  96 - 112 mEq/L    CO2 26  19 - 32 mEq/L    Glucose, Bld 198 (*) 70 - 99 mg/dL    BUN 19  6 - 23 mg/dL    Creatinine, Ser 0.95  0.50 - 1.35 mg/dL    Calcium 8.6  8.4 - 10.5 mg/dL    Total Protein 5.8 (*) 6.0 - 8.3 g/dL    Albumin 3.3 (*) 3.5 - 5.2 g/dL    AST 12  0 - 37 U/L    ALT 12  0 - 53 U/L    Alkaline Phosphatase 42  39 - 117 U/L    Total Bilirubin 0.2 (*) 0.3 - 1.2 mg/dL    GFR calc non Af Amer >90  >90 mL/min    GFR calc Af Amer >90  >90 mL/min   LIPID PANEL     Status: Abnormal   Collection Time   07/04/12  5:08 AM      Component Value Range Comment   Cholesterol 129  0 - 200 mg/dL    Triglycerides 610 (*) <150 mg/dL    HDL 28 (*) >39 mg/dL    Total CHOL/HDL Ratio 4.6      VLDL UNABLE TO CALCULATE IF TRIGLYCERIDE OVER 400 mg/dL  0 - 40 mg/dL    LDL Cholesterol UNABLE TO CALCULATE IF TRIGLYCERIDE OVER 400 mg/dL  0 - 99 mg/dL   GLUCOSE, CAPILLARY     Status: Abnormal   Collection Time   07/04/12  7:38 AM      Component Value Range Comment   Glucose-Capillary 197 (*) 70 - 99 mg/dL    Comment 1 Notify RN      Comment 2 Documented in Chart     GLUCOSE, CAPILLARY     Status: Abnormal   Collection Time   07/04/12 11:08 AM      Component Value Range Comment   Glucose-Capillary 267 (*) 70 - 99 mg/dL    Comment 1 Notify RN      Comment 2 Documented in Chart     GLUCOSE, CAPILLARY     Status: Abnormal   Collection Time   07/04/12  4:49 PM      Component Value Range Comment   Glucose-Capillary 143 (*) 70 - 99 mg/dL    Comment 1  Notify RN      Comment 2 Documented in Chart     GLUCOSE, CAPILLARY     Status: Abnormal   Collection Time   07/04/12  9:33 PM      Component Value Range Comment   Glucose-Capillary 155 (*) 70 - 99 mg/dL    Comment 1 Notify RN      Comment 2 Documented in Chart       Dg Chest 2 View  07/03/2012  *RADIOLOGY REPORT*  Clinical Data: Weakness, history cardiomyopathy, hypertension, diabetes  CHEST - 2 VIEW  Comparison: 08/23/2011  Findings: Upper-normal size of cardiac silhouette. Mediastinal contours and pulmonary vascularity normal. Lungs clear. No pleural effusion or pneumothorax. No acute osseous findings. Endplate spur formation thoracic spine.  IMPRESSION: No acute abnormalities.   Original Report Authenticated By: Elta Guadeloupe  Thornton Papas, M.D.    Ct Head Wo Contrast  07/04/2012  *RADIOLOGY REPORT*  Clinical Data: Weakness  CT HEAD WITHOUT CONTRAST  Technique:  Contiguous axial images were obtained from the base of the skull through the vertex without contrast.  Comparison: 10/10/2007 MRI  Findings: Lobular 2.8 x 1.8 cm hypoattenuation within the right basal ganglia, centered at the anterior limb internal capsule and caudate head.  There is mild mass effect on the frontal horn of the right lateral ventricle.  No intraparenchymal hemorrhage.  No hydrocephalous.  No abnormal extra-axial fluid collection. There/debris within the right sphenoid chamber.  Partial ethmoid air cell opacification.  IMPRESSION: Right basal ganglia hypodensity with mild mass effect, may reflect a subacute infarction or underlying lesion. Recommend MRI for further characterization.  Right sphenoid chamber and scattered ethmoid air cell opacification may reflect acute sinusitis.  Case discussed via telephone with Dr. Roxanne Mins at [id number] p.m. on 07/03/2012.   Original Report Authenticated By: Carlos Levering, M.D.    Mr [facility] Wo Contrast  07/04/2012  *RADIOLOGY REPORT*  Clinical Data:  Abnormal head CT  MRI HEAD WITHOUT CONTRAST MRA HEAD  WITHOUT CONTRAST  Technique:  Multiplanar, multiecho pulse sequences of the brain and surrounding structures were obtained without intravenous contrast. Angiographic images of the head were obtained using MRA technique without contrast.  Comparison:  CT 07/03/2012  MRI HEAD  Findings:  Acute infarct in the right basal ganglia involving the anterior putamen, anterior limb internal capsule, and head of the caudate.  This measures approximately  2.5 cm in diameter.  No other areas of acute infarct.  Minimal chronic microvascular ischemic changes in the cerebral white matter.  Brainstem and cerebellum are intact.  Negative for hemorrhage or mass.  Chronic sinusitis.  IMPRESSION: Acute infarct right lenticular nuclei, corresponding to the CT hypodensity.  Negative for intracranial hemorrhage  MRA HEAD  Findings: Dominant right vertebral artery which is widely patent to the basilar.  Nondominant left vertebral artery contains a mild to moderate stenosis distally.  The basilar is patent.  Superior cerebellar arteries are patent bilaterally.  Patent posterior communicating artery bilaterally.  Patent posterior cerebral artery bilaterally with mild distal atherosclerotic disease.  Hypoplastic left P1 segment.  Internal carotid artery is patent with atherosclerotic disease in the cavernous segment on the right causing moderate stenosis. Anterior and middle cerebral arteries are patent bilaterally.  Mild to moderate atherosclerotic disease of the middle cerebral artery bifurcation bilaterally.  Both anterior cerebral arteries are patent without stenosis.  Negative for cerebral aneurysm.  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  No large vessel occlusion.   Original Report Authenticated By: Carl Best, M.D.    Mr [facility] Wo Contrast  07/04/2012  *RADIOLOGY REPORT*  Clinical Data:  Abnormal head CT  MRI HEAD WITHOUT CONTRAST MRA HEAD WITHOUT CONTRAST  Technique:  Multiplanar, multiecho  pulse sequences of the brain and surrounding structures were obtained without intravenous contrast. Angiographic images of the head were obtained using MRA technique without contrast.  Comparison:  CT 07/03/2012  MRI HEAD  Findings:  Acute infarct in the right basal ganglia involving the anterior putamen, anterior limb internal capsule, and head of the caudate.  This measures approximately  2.5 cm in diameter.  No other areas of acute infarct.  Minimal chronic microvascular ischemic changes in the cerebral white matter.  Brainstem and cerebellum are intact.  Negative for hemorrhage or mass.  Chronic sinusitis.  IMPRESSION: Acute infarct right lenticular nuclei, corresponding  to the CT hypodensity.  Negative for intracranial hemorrhage  MRA HEAD  Findings: Dominant right vertebral artery which is widely patent to the basilar.  Nondominant left vertebral artery contains a mild to moderate stenosis distally.  The basilar is patent.  Superior cerebellar arteries are patent bilaterally.  Patent posterior communicating artery bilaterally.  Patent posterior cerebral artery bilaterally with mild distal atherosclerotic disease.  Hypoplastic left P1 segment.  Internal carotid artery is patent with atherosclerotic disease in the cavernous segment on the right causing moderate stenosis. Anterior and middle cerebral arteries are patent bilaterally.  Mild to moderate atherosclerotic disease of the middle cerebral artery bifurcation bilaterally.  Both anterior cerebral arteries are patent without stenosis.  Negative for cerebral aneurysm.  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  No large vessel occlusion.   Original Report Authenticated By: Carl Best, M.D.    US Carotid Duplex Bilateral  07/04/2012  *RADIOLOGY REPORT*  Clinical Data: Right brain acute infarct. Hypertension, coronary artery disease, diabetes.  BILATERAL CAROTID DUPLEX ULTRASOUND  Technique: Pearline Cables scale imaging, color  Doppler and duplex ultrasound was performed of bilateral carotid and vertebral arteries in the neck.  Comparison: None available  Criteria:  Quantification of carotid stenosis is based on velocity parameters that correlate the residual internal carotid diameter with NASCET-based stenosis levels, using the diameter of the distal internal carotid lumen as the denominator for stenosis measurement.  The following velocity measurements were obtained:                   PEAK SYSTOLIC/END DIASTOLIC RIGHT ICA:                        56/18cm/sec CCA:                        [id number] SYSTOLIC ICA/CCA RATIO:     [id number] DIASTOLIC ICA/CCA RATIO:    2.3 ECA:                        52cm/sec  LEFT ICA:                        86/28cm/sec CCA:                        [id number] SYSTOLIC ICA/CCA RATIO:     0.8 DIASTOLIC ICA/CCA RATIO:    1.97 ECA:                        156cm/sec  Findings:  RIGHT CAROTID ARTERY: The common carotid artery is tortuous.  There is mild intimal thickening without focal plaque accumulation or stenosis.  Normal wave forms and color Doppler signal.  There is a high bifurcation.  RIGHT VERTEBRAL ARTERY:  Normal flow direction and waveform.  LEFT CAROTID ARTERY: Mild intimal thickening in the bulb without focal plaque accumulation or stenosis.  Normal wave forms and color Doppler signal.  High bifurcation.  LEFT VERTEBRAL ARTERY:  Normal flow direction and waveform.  IMPRESSION:  1.  No significant carotid bifurcation or proximal ICA plaque or stenosis.   Original Report Authenticated By: D. Wallace Going, MD     Review of Systems  Constitutional: Negative.   HENT: Negative.   Eyes: Negative.   Respiratory: Negative.   Cardiovascular: Negative.   Gastrointestinal: Negative.   Genitourinary: Negative.   Musculoskeletal:  Negative.   Skin: Negative.   Endo/Heme/Allergies: Negative.   Psychiatric/Behavioral: Negative.    Blood pressure 159/87, pulse 64, temperature 97.9 F (36.6 C), temperature  source Oral, resp. rate 20, height 6' (1.829 m), weight 104.826 kg (231 lb 1.6 oz), SpO2 97.00%. Physical Exam  Assessment/Plan: 1. Acute right basal ganglia infarct. Risk factors hypertension, diabetes and age. The patient has been a probably switch from aspirin to Plavix. Other risk factor modification includes uncontrolled hypertension diabetes. An echo is pending. However, I anticipate the patient likely can be discharged home today. 2. Likely underlying obstructive sleep apnea syndrome. I recommend outpatient testing.     The patient is a 57 year old right-handed black male who was taken to the hospital by his wife. She had concerns at the right lower facial area was twisted. The symptoms have been there for 2 days. The patient himself however he does not report feeling a sensation of twisting or weakness of the facial muscles. The patient denies other neurological symptoms such as headache, dysarthria, diplopia, dysphagia or focal weakness. He feels that he is at his baseline. The patient has been on aspirin at home.  GENERAL: This very pleasant man in no acute distress.  HEENT: The patient has a large tongue with crowded posterior space graded as a 4. Neck is supple.  ABDOMEN: soft  EXTREMITIES: No edema   BACK: Unremarkable.  SKIN: Normal by inspection.    MENTAL STATUS: Alert and oriented. Speech, language and cognition are generally intact. Judgment and insight normal.   CRANIAL NERVES: Pupils are equal, round and reactive to light and accomodation; extra ocular movements are full, there is no significant nystagmus; visual fields are full; upper and lower facial muscles are normal in strength and symmetric, there is mild flattening of the nasolabial fold on the left side; tongue is midline; uvula is midline; shoulder elevation is normal.  MOTOR: Normal tone, bulk and strength; no pronator drift.  COORDINATION: Left finger to nose is normal, right finger to nose is normal, No  rest tremor; no intention tremor; no postural tremor; no bradykinesia.  REFLEXES: Deep tendon reflexes are symmetrical and normal. Babinski reflexes are flexor bilaterally.   SENSATION: Normal to light touch.   The patient's brain MRI is reviewed in person. There is no acute infarction on diffusion images involving the caudate nuclei and extended into the putamen on the right side. The extent essentially extends to about two thirds of the straitum. There is also a T2 deep white matter hyperintensity seen on FLAIR imaging.   [place], [place] 07/05/2012, 8:37 AM

## 2012-07-05 NOTE — Plan of Care (Signed)
Problem: Discharge Progression Outcomes Goal: Discharge plan in place and appropriate Outcome: Completed/Met Date Met:  07/05/12 Pt d/c home with family

## 2012-07-05 NOTE — Discharge Summary (Signed)
Physician Discharge Summary  Charles BARELA Sr. [id number] DOB: 1956/07/28 DOA: 07/03/2012  PCP: Axel Filler, MD  Admit date: 07/03/2012 Discharge date: 07/05/2012  Time spent: 35 minutes  Recommendations for Outpatient Follow-up:  1. Follow with primary care physician in 2 weeks as scheduled.  Discharge Diagnoses:  Principal Problem:  *CVA (cerebral vascular accident) Active Problems:  DIABETES MELLITUS, TYPE II  DIABETIC PERIPHERAL NEUROPATHY  HYPERLIPIDEMIA  HEARING LOSS, RIGHT EAR  HYPERTENSION  CARDIOMYOPATHY  CONGESTIVE HEART FAILURE  Hypertension  Hyperglycemia  Abnormal CT scan, head   Discharge Condition: improved  Diet recommendation: low salt, low carb  Filed Weights   07/04/12 0139 07/04/12 0627 07/05/12 0410  Weight: 101.606 kg (224 lb) 104.917 kg (231 lb 4.8 oz) 104.826 kg (231 lb 1.6 oz)    History of present illness:  Charles Langell. is an 56 y.o. male. African American gentleman with a history of diabetes, hypertension, hyperlipidemia, is brought into the emergency room by his wife will assist that the right side of his face has not been looking right the past 2 days and that he also seems to be having some mild memory impairment. Patient denies any problems at all feels he is completely at his baseline, but reports he has noted no changes.  Physical examination in the emergency room did not reveal any abnormalities however a CT scan of the brain revealed an abnormality in the basal ganglia differential includes subacute stroke or tumor; there is mass effect on the ventricle and the hospitalist service was called to assist with management.  Is no history of visual disturbance not speech or swallowing impairment. There is been no fever or other systemic symptoms  His father died from a ruptured cerebral aneurysm; he has never been checked for cerebral aneurysm.   Hospital Course:  This gentleman was admitted to the hospital with possible  right-sided facial droop and transient lethargy. By the time he arrived to the hospital his symptoms had resolved. He underwent CT scan of the head which indicated a possible infarct versus intracranial mass. He was subsequently admitted to the hospital for further treatment. MRI of the brain confirmed an acute stroke. He had been taking an aspirin on a daily basis prior to admission. This has been substituted with Plavix. Lipid panel was checked and was found to indicate hypertriglyceridemia. Patient was started on Niaspan. Blood pressure also remains elevated, and patient is on multiple blood pressure medications. We will add hydralazine to his regimen and have further titration with primary care physician. Regarding his diabetes, A1c was checked and was found to be 8.2. We will defer further management to the primary care physician. Patient does not appear to have any residual deficits at this time. He's been cleared for discharge by neurology as well as physical therapy. He has an appointment with his primary care physician in 2 weeks and we have asked him to keep this appointment.  Procedures: 2D echo: Left ventricle: The cavity size was normal. Wall thickness was increased in a pattern of moderate LVH. Systolic function was normal. The estimated ejection fraction was in the range of 55% to 60%. Wall motion was normal; there were no regional wall motion abnormalities. Doppler parameters are consistent with abnormal left ventricular relaxation (grade 1 diastolic dysfunction).     Consultations:  Neurology, Dr. Merlene Laughter  Discharge Exam: Filed Vitals:   07/04/12 1106 07/04/12 2127 07/05/12 0410 07/05/12 1000  BP: 189/90 171/88 159/87 171/85  Pulse: 82 65 64 68  Temp:  98.3 F (36.8 C) 97.9 F (36.6 C) 98.2 F (36.8 C)  TempSrc:  Oral Oral Oral  Resp:  20 20 20   Height:      Weight:   104.826 kg (231 lb 1.6 oz)   SpO2:  94% 97% 96%    General: NAD Cardiovascular: s1, s2,  rrr Respiratory: cta b  Discharge Instructions  Discharge Orders    Future Appointments: Provider: Department: Dept Phone: Center:   07/18/2012 8:15 AM Axel Filler, MD [place] [phone] [facility]     Future Orders Please Complete By Expires   Diet - low sodium heart healthy      Diet Carb Modified      Increase activity slowly      Call MD for:      Comments:   Left or right sided weakness, confusion, chest pain, shortness of breath       Medication List     As of 07/05/2012 11:45 AM    STOP taking these medications         aspirin EC 81 MG tablet      clobetasol cream 0.05 %   Commonly known as: TEMOVATE      TAKE these medications         amLODipine 10 MG tablet   Commonly known as: NORVASC   Take 10 mg by mouth daily.      carvedilol 25 MG tablet   Commonly known as: COREG   Take 25 mg by mouth 2 (two) times daily with a meal.      cloNIDine 0.1 MG tablet   Commonly known as: CATAPRES   Take 0.3 mg by mouth 2 (two) times daily.      clopidogrel 75 MG tablet   Commonly known as: PLAVIX   Take 1 tablet (75 mg total) by mouth daily with breakfast.      digoxin 0.25 MG tablet   Commonly known as: LANOXIN   Take 0.25 mg by mouth daily.      furosemide 40 MG tablet   Commonly known as: LASIX   Take 60 mg by mouth daily.      gabapentin 300 MG capsule   Commonly known as: NEURONTIN   Take 300 mg by mouth daily.      hydrALAZINE 25 MG tablet   Commonly known as: APRESOLINE   Take 1 tablet (25 mg total) by mouth 3 (three) times daily.      insulin glargine 100 UNIT/ML injection   Commonly known as: LANTUS   Inject 15 Units into the skin at bedtime.      lisinopril 40 MG tablet   Commonly known as: PRINIVIL,ZESTRIL   Take 40 mg by mouth daily.      metFORMIN 1000 MG tablet   Commonly known as: GLUCOPHAGE   Take 1,000 mg by mouth 2 (two) times daily.      multivitamin with minerals Tabs   Take 1 tablet by mouth  daily.      niacin 500 MG CR capsule   Take 1 capsule (500 mg total) by mouth at bedtime.      pravastatin 40 MG tablet   Commonly known as: PRAVACHOL   Take 40 mg by mouth daily.          The results of significant diagnostics from this hospitalization (including imaging, microbiology, ancillary and laboratory) are listed below for reference.    Significant Diagnostic Studies: Dg Chest 2 View  07/03/2012  *  RADIOLOGY REPORT*  Clinical Data: Weakness, history cardiomyopathy, hypertension, diabetes  CHEST - 2 VIEW  Comparison: 08/23/2011  Findings: Upper-normal size of cardiac silhouette. Mediastinal contours and pulmonary vascularity normal. Lungs clear. No pleural effusion or pneumothorax. No acute osseous findings. Endplate spur formation thoracic spine.  IMPRESSION: No acute abnormalities.   Original Report Authenticated By: Lavonia Dana, M.D.    Ct Head Wo Contrast  07/04/2012  *RADIOLOGY REPORT*  Clinical Data: Weakness  CT HEAD WITHOUT CONTRAST  Technique:  Contiguous axial images were obtained from the base of the skull through the vertex without contrast.  Comparison: 10/10/2007 MRI  Findings: Lobular 2.8 x 1.8 cm hypoattenuation within the right basal ganglia, centered at the anterior limb internal capsule and caudate head.  There is mild mass effect on the frontal horn of the right lateral ventricle.  No intraparenchymal hemorrhage.  No hydrocephalous.  No abnormal extra-axial fluid collection. There/debris within the right sphenoid chamber.  Partial ethmoid air cell opacification.  IMPRESSION: Right basal ganglia hypodensity with mild mass effect, may reflect a subacute infarction or underlying lesion. Recommend MRI for further characterization.  Right sphenoid chamber and scattered ethmoid air cell opacification may reflect acute sinusitis.  Case discussed via telephone with Dr. Roxanne Mins at [id number] p.m. on 07/03/2012.   Original Report Authenticated By: Carlos Levering, M.D.    Mr [facility] Wo Contrast  07/04/2012  *RADIOLOGY REPORT*  Clinical Data:  Abnormal head CT  MRI HEAD WITHOUT CONTRAST MRA HEAD WITHOUT CONTRAST  Technique:  Multiplanar, multiecho pulse sequences of the brain and surrounding structures were obtained without intravenous contrast. Angiographic images of the head were obtained using MRA technique without contrast.  Comparison:  CT 07/03/2012  MRI HEAD  Findings:  Acute infarct in the right basal ganglia involving the anterior putamen, anterior limb internal capsule, and head of the caudate.  This measures approximately  2.5 cm in diameter.  No other areas of acute infarct.  Minimal chronic microvascular ischemic changes in the cerebral white matter.  Brainstem and cerebellum are intact.  Negative for hemorrhage or mass.  Chronic sinusitis.  IMPRESSION: Acute infarct right lenticular nuclei, corresponding to the CT hypodensity.  Negative for intracranial hemorrhage  MRA HEAD  Findings: Dominant right vertebral artery which is widely patent to the basilar.  Nondominant left vertebral artery contains a mild to moderate stenosis distally.  The basilar is patent.  Superior cerebellar arteries are patent bilaterally.  Patent posterior communicating artery bilaterally.  Patent posterior cerebral artery bilaterally with mild distal atherosclerotic disease.  Hypoplastic left P1 segment.  Internal carotid artery is patent with atherosclerotic disease in the cavernous segment on the right causing moderate stenosis. Anterior and middle cerebral arteries are patent bilaterally.  Mild to moderate atherosclerotic disease of the middle cerebral artery bifurcation bilaterally.  Both anterior cerebral arteries are patent without stenosis.  Negative for cerebral aneurysm.  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  No large vessel occlusion.   Original Report Authenticated By: Carl Best, M.D.    Mr [facility] Wo Contrast  07/04/2012  *RADIOLOGY REPORT*   Clinical Data:  Abnormal head CT  MRI HEAD WITHOUT CONTRAST MRA HEAD WITHOUT CONTRAST  Technique:  Multiplanar, multiecho pulse sequences of the brain and surrounding structures were obtained without intravenous contrast. Angiographic images of the head were obtained using MRA technique without contrast.  Comparison:  CT 07/03/2012  MRI HEAD  Findings:  Acute infarct in the right basal ganglia involving the anterior putamen,  anterior limb internal capsule, and head of the caudate.  This measures approximately  2.5 cm in diameter.  No other areas of acute infarct.  Minimal chronic microvascular ischemic changes in the cerebral white matter.  Brainstem and cerebellum are intact.  Negative for hemorrhage or mass.  Chronic sinusitis.  IMPRESSION: Acute infarct right lenticular nuclei, corresponding to the CT hypodensity.  Negative for intracranial hemorrhage  MRA HEAD  Findings: Dominant right vertebral artery which is widely patent to the basilar.  Nondominant left vertebral artery contains a mild to moderate stenosis distally.  The basilar is patent.  Superior cerebellar arteries are patent bilaterally.  Patent posterior communicating artery bilaterally.  Patent posterior cerebral artery bilaterally with mild distal atherosclerotic disease.  Hypoplastic left P1 segment.  Internal carotid artery is patent with atherosclerotic disease in the cavernous segment on the right causing moderate stenosis. Anterior and middle cerebral arteries are patent bilaterally.  Mild to moderate atherosclerotic disease of the middle cerebral artery bifurcation bilaterally.  Both anterior cerebral arteries are patent without stenosis.  Negative for cerebral aneurysm.  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  No large vessel occlusion.   Original Report Authenticated By: Carl Best, M.D.    US Carotid Duplex Bilateral  07/04/2012  *RADIOLOGY REPORT*  Clinical Data: Right brain acute infarct.  Hypertension, coronary artery disease, diabetes.  BILATERAL CAROTID DUPLEX ULTRASOUND  Technique: Pearline Cables scale imaging, color Doppler and duplex ultrasound was performed of bilateral carotid and vertebral arteries in the neck.  Comparison: None available  Criteria:  Quantification of carotid stenosis is based on velocity parameters that correlate the residual internal carotid diameter with NASCET-based stenosis levels, using the diameter of the distal internal carotid lumen as the denominator for stenosis measurement.  The following velocity measurements were obtained:                   PEAK SYSTOLIC/END DIASTOLIC RIGHT ICA:                        56/18cm/sec CCA:                        [id number] SYSTOLIC ICA/CCA RATIO:     [id number] DIASTOLIC ICA/CCA RATIO:    2.3 ECA:                        52cm/sec  LEFT ICA:                        86/28cm/sec CCA:                        [id number] SYSTOLIC ICA/CCA RATIO:     0.8 DIASTOLIC ICA/CCA RATIO:    1.97 ECA:                        156cm/sec  Findings:  RIGHT CAROTID ARTERY: The common carotid artery is tortuous.  There is mild intimal thickening without focal plaque accumulation or stenosis.  Normal wave forms and color Doppler signal.  There is a high bifurcation.  RIGHT VERTEBRAL ARTERY:  Normal flow direction and waveform.  LEFT CAROTID ARTERY: Mild intimal thickening in the bulb without focal plaque accumulation or stenosis.  Normal wave forms and color Doppler signal.  High bifurcation.  LEFT VERTEBRAL ARTERY:  Normal flow direction and waveform.  IMPRESSION:  1.  No significant carotid bifurcation or proximal ICA plaque or stenosis.   Original Report Authenticated By: D. Wallace Going, MD     Microbiology: No results found for this or any previous visit (from the past 240 hour(s)).   Labs: Basic Metabolic Panel:  Lab 123XX123 0508 07/03/12 2313  NA 139 138  K 3.4* 3.7  CL 102 101  CO2 26 27  GLUCOSE 198* 347*  BUN 19 21  CREATININE 0.95 1.14  CALCIUM 8.6  9.0  MG -- --  PHOS -- --   Liver Function Tests:  Lab 07/04/12 0508 07/03/12 2313  AST 12 13  ALT 12 13  ALKPHOS 42 43  BILITOT 0.2* 0.4  PROT 5.8* 6.4  ALBUMIN 3.3* 3.6   No results found for this basename: LIPASE:5,AMYLASE:5 in the last 168 hours No results found for this basename: AMMONIA:5 in the last 168 hours CBC:  Lab 07/04/12 0508 07/03/12 2313  WBC 5.3 4.5  NEUTROABS -- 2.1  HGB 11.5* 13.0  HCT 33.4* 37.2*  MCV 82.3 81.9  PLT 253 241   Cardiac Enzymes:  Lab 07/03/12 2313  CKTOTAL --  CKMB --  CKMBINDEX --  TROPONINI <0.30   BNP: BNP (last 3 results) No results found for this basename: PROBNP:3 in the last 8760 hours CBG:  Lab 07/04/12 2133 07/04/12 1649 07/04/12 1108 07/04/12 0738 07/04/12 0218  GLUCAP 155* 143* 267* 197* 166*       Signed:  Daniele Dillow  [facility] 07/05/2012, 11:45 AM

## 2012-07-18 ENCOUNTER — Encounter: Payer: Self-pay | Admitting: Internal Medicine

## 2012-07-18 ENCOUNTER — Ambulatory Visit (INDEPENDENT_AMBULATORY_CARE_PROVIDER_SITE_OTHER): Payer: Medicare Other | Admitting: Internal Medicine

## 2012-07-18 VITALS — BP 142/80 | HR 76 | Temp 98.1°F | Ht 72.0 in | Wt 222.8 lb

## 2012-07-18 DIAGNOSIS — I6350 Cerebral infarction due to unspecified occlusion or stenosis of unspecified cerebral artery: Secondary | ICD-10-CM

## 2012-07-18 DIAGNOSIS — E119 Type 2 diabetes mellitus without complications: Secondary | ICD-10-CM

## 2012-07-18 DIAGNOSIS — E785 Hyperlipidemia, unspecified: Secondary | ICD-10-CM

## 2012-07-18 DIAGNOSIS — I10 Essential (primary) hypertension: Secondary | ICD-10-CM

## 2012-07-18 DIAGNOSIS — I639 Cerebral infarction, unspecified: Secondary | ICD-10-CM

## 2012-07-18 LAB — BASIC METABOLIC PANEL WITH GFR
CO2: 28 mEq/L (ref 19–32)
Calcium: 9.3 mg/dL (ref 8.4–10.5)
Creat: 1.11 mg/dL (ref 0.50–1.35)
GFR, Est African American: 86 mL/min
Glucose, Bld: 337 mg/dL — ABNORMAL HIGH (ref 70–99)
Sodium: 138 mEq/L (ref 135–145)

## 2012-07-18 LAB — GLUCOSE, CAPILLARY

## 2012-07-18 MED ORDER — INSULIN GLARGINE 100 UNIT/ML ~~LOC~~ SOLN: 20.0000 [IU] | Freq: Every day | SUBCUTANEOUS | Status: DC

## 2012-07-18 MED ORDER — FENOFIBRATE 48 MG PO TABS: 48.0000 mg | ORAL_TABLET | Freq: Every day | ORAL | Status: DC

## 2012-07-18 NOTE — Assessment & Plan Note (Signed)
Hemoglobin A1C  Date Value Range Status  07/04/2012 8.2* <5.7 % Final  04/18/2012 10.6   Final  05/11/2011 7.7   Final     Assessment:  Diabetes control: fair control  Progress toward A1C goal:  improved  Comments: patient's hemoglobin A1c has improved compared to September, but still is above goal.  Plan:  Medications:  increase Lantus insulin to a dose of 20 units daily; continue metformin at current dose of 1000 mg twice a day  Home glucose monitoring:   Frequency: 2 times a day   Timing: before breakfast;before dinner  Instruction/counseling given: reminded to bring blood glucose meter & log to each visit and reminded to bring medications to each visit  Educational resources provided: brochure  Self management tools provided: home glucose logbook  Other plans: refer to diabetes educator

## 2012-07-18 NOTE — Assessment & Plan Note (Signed)
Assessment: Patient was hospitalized in November with symptoms of facial asymmetry, and found to have an acute CVA.  MRI of the brain 07/04/2012 showed an acute infarct in the right basal ganglia involving the anterior putamen, anterior limb internal capsule, and head of the caudate; this measured approximately 2.5 cm in diameter.  Patient has no apparent sequelae of the stroke.  He is currently on clopidogrel (Plavix) 75 mg daily for stroke prevention.  Plan: Continue clopidogrel (Plavix) 75 mg daily.

## 2012-07-18 NOTE — Patient Instructions (Addendum)
General Instructions: Stop niacin (Niaspan). Start fenofibrate (TriCor) 48 mg one tablet daily for high triglycerides. Call if you have any problems with muscle aches or other new problems after starting the fenofibrate. Increase Lantus insulin to a dose of 20 units once daily.    Treatment Goals:  Goals (1 Years of Data) as of 07/18/2012          As of Today As of Today 07/05/12 07/05/12 07/04/12     Blood Pressure    . Blood Pressure < 140/80  142/80 147/75 171/85 159/87 171/88     Result Component    . HEMOGLOBIN A1C < 7.0      8.2    . LDL CALC < 70      UNABLE TO CALCULA...      Progress Toward Treatment Goals:  Treatment Goal 07/18/2012  Hemoglobin A1C improved  Blood pressure improved    Self Care Goals & Plans:  Self Care Goal 07/18/2012  Manage my medications take my medicines as prescribed; refill my medications on time  Monitor my health keep track of my blood glucose; bring my glucose meter and log to each visit; check my feet daily  Eat healthy foods eat baked foods instead of fried foods; drink diet soda or water instead of juice or soda  Be physically active find an activity I enjoy    Home Blood Glucose Monitoring 07/18/2012  Check my blood sugar 2 times a day  When to check my blood sugar before breakfast; before dinner     Care Management & Community Referrals:  Referral 07/18/2012  Referrals made for care management support diabetes educator  Referrals made to community resources (No Data)

## 2012-07-18 NOTE — Assessment & Plan Note (Addendum)
BP Readings from Last 3 Encounters:  07/18/12 142/80  07/05/12 171/85  06/07/12 137/78   Lab Results  Component Value Date   NA 139 07/04/2012   K 3.4* 07/04/2012   CREATININE 0.95 07/04/2012     Assessment:  Blood pressure control: mildly elevated  Progress toward BP goal:  improved  Comments: blood pressure is very close to goal; will not adjust medication regimen today  Plan:  Medications:  continue current medications; check a basic metabolic today.  Educational resources provided: brochure  Self management tools provided: home blood pressure logbook

## 2012-07-18 NOTE — Assessment & Plan Note (Addendum)
Lipids:    Component Value Date/Time   CHOL 129 07/04/2012 0508   TRIG 610* 07/04/2012 0508   HDL 28* 07/04/2012 0508   LDLCALC UNABLE TO CALCULATE IF TRIGLYCERIDE OVER 400 mg/dL 07/04/2012 0508   VLDL UNABLE TO CALCULATE IF TRIGLYCERIDE OVER 400 mg/dL 07/04/2012 0508   CHOLHDL 4.6 07/04/2012 0508    Assessment: Patient's triglyceride level was elevated at 610 during his hospitalization.  At that time, Niaspan was added to his pravastatin.  I am concerned about the potential for Niaspan to elevate his blood sugars.  Plan: Stop Niaspan; start fenofibrate (TriCor) 48 mg daily.  I counseled patient about possible side effects of the interaction of the fenofibrate and pravastatin, and advised him to let me know if he has any muscle aches or other problems.  Will also check a direct LDL today.

## 2012-07-18 NOTE — Progress Notes (Signed)
  Subjective:    Patient ID: Charles Hart., male    DOB: 11/14/1955, 56 y.o.   MRN: [id number]  HPI Patient presents for follow up of a recent hospitalization with stroke, and for follow up of his diabetes mellitus, hypertension, hyperlipidemia, and other chronic medical problems.  Patient was hospitalized in November with reported facial asymmetry; MRI of the brain 07/04/2012 showed an acute infarct in the right basal ganglia involving the anterior putamen, anterior limb internal capsule, and head of the caudate; this measured approximately 2.5 cm in diameter. He was started on Plavix for stroke prevention.  Because of elevated triglycerides, Niaspan was added to his medications; because of elevated blood pressure, hydralazine was added to his antihypertensive regimen.  He reports that he has done well since his discharge home, with no recurrent neurologic symptoms.  He reports no apparent persisting deficits from his stroke.  He reports that he has been compliant with his medications.  His blood sugars have been elevated, generally in the 200-250 range.  He did not bring his medications to clinic today, and confirmed his list from memory.  Review of Systems  Constitutional: Negative for fever, chills and diaphoresis.  Respiratory: Negative for shortness of breath.   Cardiovascular: Negative for chest pain and leg swelling.  Gastrointestinal: Negative for nausea, vomiting, abdominal pain and blood in stool.  Genitourinary: Negative for dysuria, frequency and difficulty urinating.  Musculoskeletal: Negative for myalgias and arthralgias.  Neurological: Negative for dizziness, syncope, facial asymmetry, speech difficulty, weakness and numbness.    Past medical, family, and social history were reviewed and updated.     Objective:   Physical Exam  Constitutional: He is oriented to person, place, and time. No distress.  Cardiovascular: Normal rate, regular rhythm and normal heart sounds.   Exam reveals no gallop, no S3, no S4 and no friction rub.   No murmur heard.      No lower extremity edema.  Pulmonary/Chest: Effort normal and breath sounds normal. No respiratory distress. He has no wheezes. He has no rales.  Abdominal: Soft. Bowel sounds are normal. He exhibits no distension. There is no hepatosplenomegaly. There is no tenderness.  Neurological: He is alert and oriented to person, place, and time. He has normal strength. No cranial nerve deficit. Coordination and gait normal.        Assessment & Plan:

## 2012-07-20 ENCOUNTER — Ambulatory Visit: Payer: Medicare Other | Attending: Neurology | Admitting: Sleep Medicine

## 2012-07-20 DIAGNOSIS — R5383 Other fatigue: Secondary | ICD-10-CM | POA: Insufficient documentation

## 2012-07-20 DIAGNOSIS — R0609 Other forms of dyspnea: Secondary | ICD-10-CM | POA: Insufficient documentation

## 2012-07-20 DIAGNOSIS — G4733 Obstructive sleep apnea (adult) (pediatric): Secondary | ICD-10-CM | POA: Insufficient documentation

## 2012-07-20 DIAGNOSIS — R0989 Other specified symptoms and signs involving the circulatory and respiratory systems: Secondary | ICD-10-CM | POA: Insufficient documentation

## 2012-07-20 DIAGNOSIS — G4730 Sleep apnea, unspecified: Secondary | ICD-10-CM

## 2012-07-20 DIAGNOSIS — R5381 Other malaise: Secondary | ICD-10-CM | POA: Insufficient documentation

## 2012-07-21 NOTE — Procedures (Signed)
[name], MD     [email or web address]        NAME:  Charles Hart, Charles Hart             ACCOUNT NO.:  [id number]  MEDICAL RECORD NO.:  [id number]          PATIENT TYPE:  OUT  LOCATION:  SLEEP LAB                     FACILITY:  [facility]  PHYSICIAN:  [name], M.D. DATE OF BIRTH:  1956/06/24  DATE OF STUDY:  07/20/2012                           NOCTURNAL POLYSOMNOGRAM  REFERRING PHYSICIAN:  Jay Schlichter, M.D.  INDICATION:  A 56 year old man, who presents with witnessed apnea, fatigue, and snoring.  The study is being done to evaluate for obstructive sleep apnea syndrome.   MEDICATIONS:  Neurontin, clonidine, aspirin, Coreg, lisinopril, metformin, pravastatin, insulin, digoxin, Norvasc, Lasix.  EPWORTH SLEEPINESS SCALE:  23.  BMI 30.  ARCHITECTURAL SUMMARY:  This is a split night recording with initial portion being a diagnostic and the 2nd part a titration study.  The total recording time is 448 minutes, sleep efficiency 75%.  Sleep latency is 9 minutes.  REM latency 85 minutes.  RESPIRATORY SUMMARY:  Baseline oxygen saturation 91, lowest saturation 71 during REM sleep.  Diagnostic AHI is 57.  The patient was placed on positive pressure between 5, 8 and 9.  The optimal pressure is 9 with resolution of all events and good tolerance.  LIMB MOVEMENT SUMMARY:  PLM index is 7.  ELECTROCARDIOGRAM SUMMARY:  Average heart rate is 79 with no significant dysrhythmias observed.  IMPRESSION:  Severe obstructive sleep apnea syndrome, which responds well to a CPAP of 9.   [name], M.D.    KAD/MEDQ  D:  07/21/2012 17:08:09  T:  07/21/2012 17:27:52  Job:  [id number]

## 2012-07-23 ENCOUNTER — Telehealth: Payer: Self-pay | Admitting: Internal Medicine

## 2012-07-23 NOTE — Telephone Encounter (Signed)
Because of increasing somnolence and concern for underlying obstructive sleep apnea noted during patient's hospitalization at [facility] in November, a sleep study was ordered by neurologist Dr. Phillips Odor.  The sleep study was done on 07/21/2012 and showed severe obstructive sleep apnea syndrome.  I called patient and informed him of the result; he will come in Thursday 12/12 for a face-to-face evaluation for CPAP.

## 2012-07-26 ENCOUNTER — Encounter: Payer: Medicaid Other | Admitting: Internal Medicine

## 2012-07-26 ENCOUNTER — Other Ambulatory Visit: Payer: Self-pay | Admitting: Internal Medicine

## 2012-07-26 DIAGNOSIS — G4733 Obstructive sleep apnea (adult) (pediatric): Secondary | ICD-10-CM | POA: Insufficient documentation

## 2012-07-26 NOTE — Progress Notes (Signed)
During a hospitalization at [facility] in November of 2013 with an acute right basal ganglia stroke, patient was noted to have symptoms of obstructive sleep apnea and a sleep study was ordered.  This was done on 07/20/2012  and showed severe obstructive sleep apnea syndrome, which responded well to a CPAP of 9.  The results of the sleep study were forwarded to me as patient's PCP, and I placed a CPAP order today.

## 2012-07-28 ENCOUNTER — Encounter (HOSPITAL_COMMUNITY): Payer: Self-pay

## 2012-07-28 ENCOUNTER — Emergency Department (HOSPITAL_COMMUNITY): Payer: PRIVATE HEALTH INSURANCE

## 2012-07-28 ENCOUNTER — Observation Stay (HOSPITAL_COMMUNITY)
Admission: EM | Admit: 2012-07-28 | Discharge: 2012-07-29 | Disposition: A | Discharge status: Home / Self Care | Payer: PRIVATE HEALTH INSURANCE | Attending: Internal Medicine | Admitting: Internal Medicine

## 2012-07-28 DIAGNOSIS — Z8673 Personal history of transient ischemic attack (TIA), and cerebral infarction without residual deficits: Secondary | ICD-10-CM | POA: Diagnosis present

## 2012-07-28 DIAGNOSIS — Z91199 Patient's noncompliance with other medical treatment and regimen due to unspecified reason: Secondary | ICD-10-CM | POA: Insufficient documentation

## 2012-07-28 DIAGNOSIS — E1140 Type 2 diabetes mellitus with diabetic neuropathy, unspecified: Secondary | ICD-10-CM | POA: Diagnosis present

## 2012-07-28 DIAGNOSIS — I10 Essential (primary) hypertension: Principal | ICD-10-CM

## 2012-07-28 DIAGNOSIS — N529 Male erectile dysfunction, unspecified: Secondary | ICD-10-CM

## 2012-07-28 DIAGNOSIS — I639 Cerebral infarction, unspecified: Secondary | ICD-10-CM

## 2012-07-28 DIAGNOSIS — Z9119 Patient's noncompliance with other medical treatment and regimen: Secondary | ICD-10-CM | POA: Insufficient documentation

## 2012-07-28 DIAGNOSIS — R930 Abnormal findings on diagnostic imaging of skull and head, not elsewhere classified: Secondary | ICD-10-CM

## 2012-07-28 DIAGNOSIS — E669 Obesity, unspecified: Secondary | ICD-10-CM | POA: Insufficient documentation

## 2012-07-28 DIAGNOSIS — G4733 Obstructive sleep apnea (adult) (pediatric): Secondary | ICD-10-CM

## 2012-07-28 DIAGNOSIS — E1149 Type 2 diabetes mellitus with other diabetic neurological complication: Secondary | ICD-10-CM

## 2012-07-28 DIAGNOSIS — I169 Hypertensive crisis, unspecified: Secondary | ICD-10-CM

## 2012-07-28 DIAGNOSIS — I5042 Chronic combined systolic (congestive) and diastolic (congestive) heart failure: Secondary | ICD-10-CM | POA: Diagnosis present

## 2012-07-28 DIAGNOSIS — R0989 Other specified symptoms and signs involving the circulatory and respiratory systems: Secondary | ICD-10-CM

## 2012-07-28 DIAGNOSIS — D126 Benign neoplasm of colon, unspecified: Secondary | ICD-10-CM

## 2012-07-28 DIAGNOSIS — J069 Acute upper respiratory infection, unspecified: Secondary | ICD-10-CM

## 2012-07-28 DIAGNOSIS — H9190 Unspecified hearing loss, unspecified ear: Secondary | ICD-10-CM

## 2012-07-28 DIAGNOSIS — E119 Type 2 diabetes mellitus without complications: Secondary | ICD-10-CM

## 2012-07-28 DIAGNOSIS — R739 Hyperglycemia, unspecified: Secondary | ICD-10-CM

## 2012-07-28 DIAGNOSIS — I509 Heart failure, unspecified: Secondary | ICD-10-CM

## 2012-07-28 DIAGNOSIS — E785 Hyperlipidemia, unspecified: Secondary | ICD-10-CM

## 2012-07-28 DIAGNOSIS — Z8679 Personal history of other diseases of the circulatory system: Secondary | ICD-10-CM

## 2012-07-28 DIAGNOSIS — I428 Other cardiomyopathies: Secondary | ICD-10-CM

## 2012-07-28 DIAGNOSIS — L259 Unspecified contact dermatitis, unspecified cause: Secondary | ICD-10-CM

## 2012-07-28 DIAGNOSIS — E11319 Type 2 diabetes mellitus with unspecified diabetic retinopathy without macular edema: Secondary | ICD-10-CM

## 2012-07-28 DIAGNOSIS — E1122 Type 2 diabetes mellitus with diabetic chronic kidney disease: Secondary | ICD-10-CM | POA: Diagnosis present

## 2012-07-28 HISTORY — DX: Hypertensive crisis, unspecified: I16.9

## 2012-07-28 LAB — CBC WITH DIFFERENTIAL/PLATELET
Basophils Absolute: 0.1 10*3/uL (ref 0.0–0.1)
Basophils Relative: 1 % (ref 0–1)
Eosinophils Absolute: 0.2 10*3/uL (ref 0.0–0.7)
Eosinophils Relative: 2 % (ref 0–5)
HCT: 39 % (ref 39.0–52.0)
MCH: 28.6 pg (ref 26.0–34.0)
MCHC: 35.4 g/dL (ref 30.0–36.0)
MCV: 80.7 fL (ref 78.0–100.0)
Monocytes Absolute: 0.5 10*3/uL (ref 0.1–1.0)
Platelets: 307 10*3/uL (ref 150–400)
RDW: 12.1 % (ref 11.5–15.5)
WBC: 6.6 10*3/uL (ref 4.0–10.5)

## 2012-07-28 LAB — HEPATIC FUNCTION PANEL
Alkaline Phosphatase: 78 U/L (ref 39–117)
Total Bilirubin: 0.3 mg/dL (ref 0.3–1.2)
Total Protein: 7.3 g/dL (ref 6.0–8.3)

## 2012-07-28 LAB — BASIC METABOLIC PANEL
CO2: 30 mEq/L (ref 19–32)
Calcium: 9 mg/dL (ref 8.4–10.5)
Creatinine, Ser: 0.85 mg/dL (ref 0.50–1.35)
GFR calc Af Amer: >90 mL/min (ref >90)
GFR calc non Af Amer: >90 mL/min (ref >90)
Sodium: 132 mEq/L — ABNORMAL LOW (ref 135–145)

## 2012-07-28 LAB — TROPONIN I: Troponin I: <0.3 ng/mL (ref <0.30)

## 2012-07-28 LAB — GLUCOSE, CAPILLARY: Glucose-Capillary: 213 mg/dL — ABNORMAL HIGH (ref 70–99)

## 2012-07-28 MED ORDER — SODIUM CHLORIDE 0.9 % IV SOLN: INTRAVENOUS | Status: DC

## 2012-07-28 MED ORDER — CARVEDILOL 12.5 MG PO TABS
25.0000 mg | ORAL_TABLET | Freq: Two times a day (BID) | ORAL | Status: DC
  Administered 2012-07-29: 25 mg via ORAL
  Filled 2012-07-28: qty 2

## 2012-07-28 MED ORDER — LISINOPRIL 10 MG PO TABS
40.0000 mg | ORAL_TABLET | Freq: Every day | ORAL | Status: DC
Start: 2012-07-29 — End: 2012-07-29
  Administered 2012-07-29: 40 mg via ORAL
  Filled 2012-07-28: qty 4

## 2012-07-28 MED ORDER — INSULIN ASPART 100 UNIT/ML ~~LOC~~ SOLN: 0.0000 [IU] | Freq: Every day | SUBCUTANEOUS | Status: DC

## 2012-07-28 MED ORDER — TRAZODONE HCL 50 MG PO TABS: 50.0000 mg | ORAL_TABLET | Freq: Every evening | ORAL | Status: DC | PRN

## 2012-07-28 MED ORDER — ACETAMINOPHEN 325 MG PO TABS
650.0000 mg | ORAL_TABLET | ORAL | Status: DC | PRN
  Administered 2012-07-29: 650 mg via ORAL
  Filled 2012-07-28: qty 2

## 2012-07-28 MED ORDER — HYDRALAZINE HCL 20 MG/ML IJ SOLN
20.0000 mg | Freq: Once | INTRAMUSCULAR | Status: AC
  Administered 2012-07-28: 20 mg via INTRAVENOUS
  Filled 2012-07-28: qty 1

## 2012-07-28 MED ORDER — DIGOXIN 250 MCG PO TABS
0.2500 mg | ORAL_TABLET | Freq: Every day | ORAL | Status: DC
Start: 2012-07-29 — End: 2012-07-29
  Administered 2012-07-29: 0.25 mg via ORAL
  Filled 2012-07-28: qty 1

## 2012-07-28 MED ORDER — INSULIN ASPART 100 UNIT/ML ~~LOC~~ SOLN
6.0000 [IU] | Freq: Once | SUBCUTANEOUS | Status: AC
  Administered 2012-07-28: 6 [IU] via SUBCUTANEOUS
  Filled 2012-07-28: qty 1

## 2012-07-28 MED ORDER — BENZONATATE 100 MG PO CAPS: 200.0000 mg | ORAL_CAPSULE | Freq: Three times a day (TID) | ORAL | Status: DC | PRN

## 2012-07-28 MED ORDER — HYDRALAZINE HCL 25 MG PO TABS
25.0000 mg | ORAL_TABLET | Freq: Three times a day (TID) | ORAL | Status: DC
  Administered 2012-07-28 – 2012-07-29 (×2): 25 mg via ORAL
  Filled 2012-07-28 (×2): qty 1

## 2012-07-28 MED ORDER — CLONIDINE HCL 0.2 MG PO TABS
0.3000 mg | ORAL_TABLET | Freq: Two times a day (BID) | ORAL | Status: DC
  Administered 2012-07-28 – 2012-07-29 (×2): 0.3 mg via ORAL
  Filled 2012-07-28 (×2): qty 1

## 2012-07-28 MED ORDER — INSULIN GLARGINE 100 UNIT/ML ~~LOC~~ SOLN
20.0000 [IU] | Freq: Every day | SUBCUTANEOUS | Status: DC
  Administered 2012-07-28: 20 [IU] via SUBCUTANEOUS

## 2012-07-28 MED ORDER — GABAPENTIN 300 MG PO CAPS
300.0000 mg | ORAL_CAPSULE | Freq: Every day | ORAL | Status: DC
Start: 2012-07-28 — End: 2012-07-29
  Administered 2012-07-28: 300 mg via ORAL
  Filled 2012-07-28: qty 1

## 2012-07-28 MED ORDER — ONDANSETRON HCL 4 MG/2ML IJ SOLN
4.0000 mg | INTRAMUSCULAR | Status: DC | PRN
  Administered 2012-07-29: 4 mg via INTRAVENOUS
  Filled 2012-07-28: qty 2

## 2012-07-28 MED ORDER — SIMVASTATIN 20 MG PO TABS: 20.0000 mg | ORAL_TABLET | Freq: Every day | ORAL | Status: DC

## 2012-07-28 MED ORDER — CLOPIDOGREL BISULFATE 75 MG PO TABS
75.0000 mg | ORAL_TABLET | Freq: Every day | ORAL | Status: DC
  Administered 2012-07-29: 75 mg via ORAL
  Filled 2012-07-28: qty 1

## 2012-07-28 MED ORDER — HYDRALAZINE HCL 20 MG/ML IJ SOLN
10.0000 mg | INTRAMUSCULAR | Status: DC | PRN
  Administered 2012-07-29: 10 mg via INTRAVENOUS
  Filled 2012-07-28: qty 1

## 2012-07-28 MED ORDER — GUAIFENESIN ER 600 MG PO TB12
1200.0000 mg | ORAL_TABLET | Freq: Two times a day (BID) | ORAL | Status: DC
  Administered 2012-07-28 – 2012-07-29 (×2): 1200 mg via ORAL
  Filled 2012-07-28 (×6): qty 2

## 2012-07-28 MED ORDER — FUROSEMIDE 40 MG PO TABS
60.0000 mg | ORAL_TABLET | Freq: Every day | ORAL | Status: DC
  Administered 2012-07-29: 60 mg via ORAL
  Filled 2012-07-28: qty 1

## 2012-07-28 MED ORDER — CLONIDINE HCL 0.1 MG PO TABS
0.1000 mg | ORAL_TABLET | Freq: Once | ORAL | Status: AC
  Administered 2012-07-28: 0.1 mg via ORAL
  Filled 2012-07-28: qty 1

## 2012-07-28 MED ORDER — INSULIN ASPART 100 UNIT/ML ~~LOC~~ SOLN
0.0000 [IU] | Freq: Three times a day (TID) | SUBCUTANEOUS | Status: DC
  Administered 2012-07-29 (×2): 3 [IU] via SUBCUTANEOUS

## 2012-07-28 MED ORDER — INSULIN ASPART 100 UNIT/ML ~~LOC~~ SOLN: 6.0000 [IU] | Freq: Once | SUBCUTANEOUS | Status: DC

## 2012-07-28 MED ORDER — ASPIRIN EC 81 MG PO TBEC
81.0000 mg | DELAYED_RELEASE_TABLET | Freq: Every day | ORAL | Status: DC
  Administered 2012-07-29: 81 mg via ORAL
  Filled 2012-07-28: qty 1

## 2012-07-28 MED ORDER — SODIUM CHLORIDE 0.9 % IJ SOLN
3.0000 mL | Freq: Two times a day (BID) | INTRAMUSCULAR | Status: DC
  Administered 2012-07-28 – 2012-07-29 (×2): 3 mL via INTRAVENOUS

## 2012-07-28 MED ORDER — FENOFIBRATE 54 MG PO TABS
54.0000 mg | ORAL_TABLET | Freq: Every day | ORAL | Status: DC
  Administered 2012-07-29: 54 mg via ORAL
  Filled 2012-07-28 (×2): qty 1

## 2012-07-28 MED ORDER — INSULIN REGULAR HUMAN 100 UNIT/ML IJ SOLN: 6.0000 [IU] | Freq: Once | INTRAMUSCULAR | Status: DC

## 2012-07-28 MED ORDER — METFORMIN HCL 500 MG PO TABS
1000.0000 mg | ORAL_TABLET | Freq: Two times a day (BID) | ORAL | Status: DC
  Administered 2012-07-29: 1000 mg via ORAL
  Filled 2012-07-28: qty 2

## 2012-07-28 MED ORDER — AMLODIPINE BESYLATE 5 MG PO TABS
10.0000 mg | ORAL_TABLET | Freq: Every day | ORAL | Status: DC
  Administered 2012-07-29: 10 mg via ORAL
  Filled 2012-07-28: qty 2

## 2012-07-28 MED ORDER — ENOXAPARIN SODIUM 40 MG/0.4ML ~~LOC~~ SOLN
40.0000 mg | SUBCUTANEOUS | Status: DC
  Administered 2012-07-29: 40 mg via SUBCUTANEOUS
  Filled 2012-07-28: qty 0.4

## 2012-07-28 NOTE — H&P (Signed)
[facility] History and Physical  Charles Hart  [id number]  DOB: 1956-07-15   DOA: 07/28/2012   PCP:   Axel Filler, MD   Chief Complaint:  Upper respiratory infection for week  HPI: Charles Ferries Sr. is an 56 y.o. male.   Middle-aged [place] American gentleman, multiple medical recently discharged after management for an acute stroke, presents complaining of a persistent upper respiratory tract symptoms she has been going through his family feels he picked it up from one of the young children. In the emergency room patient's was not found to have any serious pulmonary issue, but his blood pressure was noted to be 210/110, and did not respond to oral challenge of clonidine 0.1 mg, so the hospitalist service was called to assist with management.  A cough, runny nose, sinus congestion patient denies any other issues. Denies fever headache nausea vomiting or diarrhea. He reports he has been taking his blood pressure medications regularly but has not taken this evening's dose as yet.   recently had a sleep study and was diagnosed with obstructive sleep apnea requiring continuous pressure of 9 cm h20, during sleep  Rewiew of Systems:   All systems negative except as marked bold or noted in the HPI;  Constitutional: Negative for malaise, fever and chills. ;  Eyes: Negative for eye pain, redness and discharge. ;  ENMT: Negative for ear pain, Cardiovascular: Negative for chest pain, palpitations, diaphoresis, dyspnea and peripheral edema. ;  Respiratory: Negative for hemoptysis, wheezing and stridor. ;  Gastrointestinal: Negative for nausea, vomiting, diarrhea, constipation, abdominal pain, melena, blood in stool, hematemesis, jaundice and rectal bleeding. unusual weight loss..   Genitourinary: Negative for frequency, dysuria, incontinence,flank pain and hematuria; Musculoskeletal: Negative for back pain and neck pain. Negative for swelling and trauma.;  Skin: .  Negative for pruritus, rash, abrasions, bruising and skin lesion.; ulcerations Neuro: Negative for headache, lightheadedness and neck stiffness. Negative for weakness, altered level of consciousness , altered mental status, extremity weakness, burning feet, involuntary movement, seizure and syncope.  Psych: negative for anxiety, depression, insomnia, tearfulness, panic attacks, hallucinations, paranoia, suicidal or homicidal ideation    Past Medical History  Diagnosis Date  . Dermatitis   . CHF (congestive heart failure)     LV function improved from 2004 to 2008.  Historically, moderately dilated LV with EF 30-40% by 2D echo 08/14/2002.  Mild CAD with severe LV dysfunction by cardiac cath 09/2002.  Normal coronary arteries and normal LV function by cardiac cath 09/19/2006.  A 2-D echo on 04/01/2009 showed mild concentric hypertrophy and normal systolic (LVEF  123456) and doppler C/W with grade 1 diastolic dysfunction.  . Hypertension   . Diabetes mellitus   . Hyperlipidemia   . Hearing loss in right ear   . Cardiomyopathy     LV function improved from 2004 to 2008.  Historically, moderately dilated LV with EF 30-40% by 2D echo 08/14/2002.  Mild CAD with severe LV dysfunction by cardiac cath 09/2002.  Normal coronary arteries and normal LV function by cardiac cath 09/19/2006.  A 2-D echo on 04/01/2009 showed mild concentric hypertrophy and normal systolic (LVEF  123456) and doppler C/W with grade 1 diastolic dysfunction.  . DM neuropathy, painful   . CVA (cerebral vascular accident) 07/04/2012    MRI of the brain 07/04/2012 showed an acute infarct in the right basal ganglia involving the anterior putamen, anterior limb internal capsule, and head of the caudate; this measured approximately 2.5 cm in diameter.  Past Surgical History  Procedure Date  . Colonoscopy   . Polypectomy   . Cardiac catheterization     3 times  . Foot surgery     Medications:  HOME MEDS: Prior to Admission  medications   Medication Sig Start Date End Date Taking? Authorizing Provider  amLODipine (NORVASC) 10 MG tablet Take 10 mg by mouth daily.   Yes Historical Provider, MD  carvedilol (COREG) 25 MG tablet Take 25 mg by mouth 2 (two) times daily with a meal.   Yes Historical Provider, MD  cloNIDine (CATAPRES) 0.1 MG tablet Take 0.3 mg by mouth 2 (two) times daily.   Yes Historical Provider, MD  clopidogrel (PLAVIX) 75 MG tablet Take 1 tablet (75 mg total) by mouth daily with breakfast. 07/05/12  Yes Kathie Dike, MD  digoxin (LANOXIN) 0.25 MG tablet Take 0.25 mg by mouth daily.   Yes Historical Provider, MD  fenofibrate (TRICOR) 48 MG tablet Take 1 tablet (48 mg total) by mouth daily. 07/18/12 07/18/13 Yes Axel Filler, MD  furosemide (LASIX) 40 MG tablet Take 60 mg by mouth daily.   Yes Historical Provider, MD  gabapentin (NEURONTIN) 300 MG capsule Take 300 mg by mouth daily.   Yes Historical Provider, MD  hydrALAZINE (APRESOLINE) 25 MG tablet Take 1 tablet (25 mg total) by mouth 3 (three) times daily. 07/05/12  Yes Kathie Dike, MD  insulin glargine (LANTUS) 100 UNIT/ML injection Inject 20 Units into the skin at bedtime. 07/18/12  Yes Axel Filler, MD  lisinopril (PRINIVIL,ZESTRIL) 40 MG tablet Take 40 mg by mouth daily.   Yes Historical Provider, MD  metFORMIN (GLUCOPHAGE) 1000 MG tablet Take 1,000 mg by mouth 2 (two) times daily.   Yes Historical Provider, MD  Multiple Vitamin (MULITIVITAMIN WITH MINERALS) TABS Take 1 tablet by mouth daily.     Yes Historical Provider, MD  pravastatin (PRAVACHOL) 40 MG tablet Take 40 mg by mouth daily.   Yes Historical Provider, MD     Allergies:  No Known Allergies  Social History:   reports that he has never smoked. He has never used smokeless tobacco. He reports that he does not drink alcohol or use illicit drugs.  Family History: Family History  Problem Relation Age of Onset  . Colon cancer Sister   . Aneurysm Father 12    died of  rupture     Physical Exam: Filed Vitals:   07/28/12 2009 07/28/12 2100 07/28/12 2114 07/28/12 2138  BP: 183/89 184/84 184/87 206/96  Pulse: 90 93 96 92  Temp:    97.9 F (36.6 C)  TempSrc:    Oral  Resp: 24 26 26 22   Height:    6' (1.829 m)  Weight:      SpO2: 97% 96% 97% 96%   Blood pressure 206/96, pulse 92, temperature 97.9 F (36.6 C), temperature source Oral, resp. rate 22, height 6' (1.829 m), weight 100.699 kg (222 lb), SpO2 96.00%.  GEN:  Pleasant African American gentleman reclining in the stretcher coughing frequently; cooperative with exam PSYCH:  alert and oriented x4; does not appear anxious or depressed; affect is appropriate. HEENT: Mucous membranes pink and anicteric; PERRLA; EOM intact; no cervical lymphadenopathy nor thyromegaly or carotid bruit; no JVD; Breasts:: Not examined CHEST WALL: No tenderness CHEST: Normal respiration, clear to auscultation bilaterally HEART: Regular rate and rhythm; no murmurs rubs or gallops BACK: No kyphosis or scoliosis; no CVA tenderness ABDOMEN: Obese, soft non-tender; no masses, no organomegaly, normal abdominal bowel sounds;  no intertriginous candida. Rectal Exam: Not done EXTREMITIES:  age-appropriate arthropathy of the hands and knees; no edema; no ulcerations. Genitalia: not examined PULSES: 2+ and symmetric SKIN: Normal hydration no rash or ulceration CNS: Cranial nerves 2-12 grossly intact no focal lateralizing neurologic deficit   Labs on Admission:  Basic Metabolic Panel:  Lab [id number]  NA 132*  K 3.5  CL 94*  CO2 30  GLUCOSE 393*  BUN 9  CREATININE 0.85  CALCIUM 9.0  MG --  PHOS --   Liver Function Tests: No results found for this basename: AST:5,ALT:5,ALKPHOS:5,BILITOT:5,PROT:5,ALBUMIN:5 in the last 168 hours No results found for this basename: LIPASE:5,AMYLASE:5 in the last 168 hours No results found for this basename: AMMONIA:5 in the last 168 hours CBC:  Lab 07/28/12 1628  WBC 6.6   NEUTROABS 3.8  HGB 13.8  HCT 39.0  MCV 80.7  PLT 307   Cardiac Enzymes:  Lab 07/28/12 1628  CKTOTAL --  CKMB --  CKMBINDEX --  TROPONINI <0.30   BNP: No components found with this basename: POCBNP:5 D-dimer: No components found with this basename: D-DIMER:5 CBG:  Lab 07/28/12 2159 07/28/12 1947  GLUCAP 199* 213*    Radiological Exams on Admission: Dg Chest 2 View  07/28/2012  *RADIOLOGY REPORT*  Clinical Data: Cough and fever.  CHEST - 2 VIEW  Comparison: 07/03/2012  Findings: Heart size is within normal limits.  Both lungs are clear.  No evidence of pleural effusion.  No mass or lymphadenopathy identified.  Ectasia of thoracic aorta is stable.  IMPRESSION: Stable exam.  No active disease.   Original Report Authenticated By: Earle Gell, M.D.      Assessment/Plan Present on Admission:  . Hypertensive crisis . URI (upper respiratory infection) . DIABETES MELLITUS, TYPE II . DIABETIC PERIPHERAL NEUROPATHY . HYPERLIPIDEMIA . CONGESTIVE HEART FAILURE . CVA (cerebral vascular accident) . Obstructive sleep apnea    PLAN: We'll admit this gentleman for hypertensive urgency and continue his home medications, and simply add when necessary intravenous hydralazine at 4 hour intervals gauge his antihypertensive needs. Currently taking hydralazine 25 mg 3 times which can likely be greatly increased.  We'll aggressively treat his cough since this may be aggravating his blood pressure. We'll provide CPAP at bedtime she'll also assist with blood pressure control. Continue his home regimen for treatment of his other chronic medical problem  Other plans as per orders.  Code Status: FULL CODE  Family Communication: Discussed with patient  Disposition Plan: We'll hopefully his blood pressure will normalize by tomorrow morning and we'll be able to discharge him home on a new regimen   Galya Dunnigan Nocturnist [facility] Pager [phone]   07/28/2012, 10:57  PM

## 2012-07-28 NOTE — ED Notes (Signed)
Pt sick for 2 weeks w/ uri, has not been seen by pmd, multiple family members sick w/ same. Having fever and chills at times

## 2012-07-28 NOTE — ED Provider Notes (Signed)
History   This chart was scribed for [employer]. Alvino Chapel, MD by Ludger Nutting, ED Scribe. This patient was seen in room A338/A338-01 and the patient's care was started at 1531.   CSN: [id number]  Arrival date & time 07/28/12  1519   First MD Initiated Contact with Patient 07/28/12 1531      Chief Complaint  Patient presents with  . URI  . Hypertension     The history is provided by the patient. No language interpreter was used.    Charles SERPE Sr. is a 56 y.o. male who presents to the Emergency Department complaining of ongoing, unchanged upper respiratory infection starting 2 weeks ago. Pt states he has not been seen by PCP and has multiple family members that are sick with the same. Pt has associated fever, chills, cough productive of mucous, constant sore throat that is now only present with the cough. Pt was seen 07/04/12 for a stroke and states his facial movement has been okay but has difficulty remembering things since then. Pt states he has had high blood sugar levels at home. Pt denies lightheadedness, dizziness, neck pain, rash, back pain, CP, SOB, abdominal pain, nausea, emesis, diarrhea, dysuria. No known allergies. No other pertinent medical symptoms.     Pt has h/o HTN, DM, CVA, hyperlipidemia, CHF, cardiomyopathy.  Pt denies smoking and alcohol use. Past Medical History  Diagnosis Date  . Dermatitis   . CHF (congestive heart failure)     LV function improved from 2004 to 2008.  Historically, moderately dilated LV with EF 30-40% by 2D echo 08/14/2002.  Mild CAD with severe LV dysfunction by cardiac cath 09/2002.  Normal coronary arteries and normal LV function by cardiac cath 09/19/2006.  A 2-D echo on 04/01/2009 showed mild concentric hypertrophy and normal systolic (LVEF  123456) and doppler C/W with grade 1 diastolic dysfunction.  . Hypertension   . Diabetes mellitus   . Hyperlipidemia   . Hearing loss in right ear   . Cardiomyopathy     LV function improved from  2004 to 2008.  Historically, moderately dilated LV with EF 30-40% by 2D echo 08/14/2002.  Mild CAD with severe LV dysfunction by cardiac cath 09/2002.  Normal coronary arteries and normal LV function by cardiac cath 09/19/2006.  A 2-D echo on 04/01/2009 showed mild concentric hypertrophy and normal systolic (LVEF  123456) and doppler C/W with grade 1 diastolic dysfunction.  . DM neuropathy, painful   . CVA (cerebral vascular accident) 07/04/2012    MRI of the brain 07/04/2012 showed an acute infarct in the right basal ganglia involving the anterior putamen, anterior limb internal capsule, and head of the caudate; this measured approximately 2.5 cm in diameter.       Past Surgical History  Procedure Date  . Colonoscopy   . Polypectomy   . Cardiac catheterization     3 times  . Foot surgery     Family History  Problem Relation Age of Onset  . Colon cancer Sister   . Aneurysm Father 43    died of rupture    History  Substance Use Topics  . Smoking status: Never Smoker   . Smokeless tobacco: Never Used  . Alcohol Use: No      Review of Systems  Constitutional: Positive for fever and chills.  HENT: Positive for sore throat.   Respiratory: Positive for cough.   Cardiovascular: Positive for leg swelling.  Gastrointestinal: Negative.   Musculoskeletal: Negative.   Skin: Negative.  Neurological: Negative.  Negative for dizziness and light-headedness.  Hematological: Negative.   Psychiatric/Behavioral: Negative.     Allergies  Review of patient's allergies indicates no known allergies.  Home Medications   No current outpatient prescriptions on file.  BP 206/96  Pulse 92  Temp 97.9 F (36.6 C) (Oral)  Resp 22  Ht 6' (1.829 m)  Wt 222 lb (100.699 kg)  BMI 30.11 kg/m2  SpO2 96%  Physical Exam  Nursing note and vitals reviewed. Constitutional: He appears well-developed and well-nourished.  HENT:  Head: Normocephalic and atraumatic.       Anterior cervical  lymadenapathy.  Posterior pharynx mildly erythematous, no exudate.    Eyes: Conjunctivae normal are normal. Pupils are equal, round, and reactive to light.  Neck: Neck supple. No tracheal deviation present. No thyromegaly present.  Cardiovascular: Regular rhythm and normal heart sounds.  Exam reveals no friction rub.   No murmur heard.      Heart sounds nl, slightly tachycardic.     Pulmonary/Chest: Effort normal and breath sounds normal.  Abdominal: Soft. Bowel sounds are normal. He exhibits no distension. There is no tenderness.       Abdomen soft, non tender.   Musculoskeletal: Normal range of motion. He exhibits no edema and no tenderness.       Mild bilateral extremety pain and edema.   Neurological: He is alert. Coordination normal.  Skin: Skin is warm and dry. No rash noted.  Psychiatric: He has a normal mood and affect.    ED Course  Procedures (including critical care time)     COORDINATION OF CARE:  4:02 PM Discussed treatment plan which includes imaging with pt at bedside and pt agreed to plan.    Labs Reviewed  BASIC METABOLIC PANEL - Abnormal; Notable for the following:    Sodium 132 (*)     Chloride 94 (*)     Glucose, Bld 393 (*)     All other components within normal limits  GLUCOSE, CAPILLARY - Abnormal; Notable for the following:    Glucose-Capillary 213 (*)     All other components within normal limits  GLUCOSE, CAPILLARY - Abnormal; Notable for the following:    Glucose-Capillary 199 (*)     All other components within normal limits  HEPATIC FUNCTION PANEL - Abnormal; Notable for the following:    Albumin 3.1 (*)     All other components within normal limits  CBC WITH DIFFERENTIAL  TROPONIN I  URINALYSIS, ROUTINE W REFLEX MICROSCOPIC  BASIC METABOLIC PANEL  CBC   Dg Chest 2 View  07/28/2012  *RADIOLOGY REPORT*  Clinical Data: Cough and fever.  CHEST - 2 VIEW  Comparison: 07/03/2012  Findings: Heart size is within normal limits.  Both lungs are  clear.  No evidence of pleural effusion.  No mass or lymphadenopathy identified.  Ectasia of thoracic aorta is stable.  IMPRESSION: Stable exam.  No active disease.   Original Report Authenticated By: Earle Gell, M.D.      1. Hypertensive crisis   2. Hyperglycemia     Date: 07/28/2012  Rate: 87  Rhythm: normal sinus rhythm  QRS Axis: normal  Intervals: normal  ST/T Wave abnormalities: nonspecific ST/T changes  Conduction Disutrbances:none  Narrative Interpretation: LVH with increased repol  Old EKG Reviewed: changes noted     MDM  Patient presents with shortness of breath upper chest pain. Family members have had similar symptoms. X-ray is reassuring. Patient was found to be hypertensive with pressures of 220/110.  EKG shows nonspecific changes. Patient's had a recent stroke. He also was found to be hyperglycemic without DKA. He'll be admitted to medicine for further management.   I personally performed the services described in this documentation, which was scribed in my presence. The recorded information has been reviewed and is accurate.       Jasper Riling. Alvino Chapel, MD 07/28/12 [phone]

## 2012-07-29 DIAGNOSIS — E119 Type 2 diabetes mellitus without complications: Secondary | ICD-10-CM

## 2012-07-29 LAB — BASIC METABOLIC PANEL
BUN: 11 mg/dL (ref 6–23)
Creatinine, Ser: 0.86 mg/dL (ref 0.50–1.35)
GFR calc non Af Amer: >90 mL/min (ref >90)
Glucose, Bld: 251 mg/dL — ABNORMAL HIGH (ref 70–99)
Potassium: 3.4 mEq/L — ABNORMAL LOW (ref 3.5–5.1)

## 2012-07-29 LAB — CBC
HCT: 37.5 % — ABNORMAL LOW (ref 39.0–52.0)
Hemoglobin: 13.1 g/dL (ref 13.0–17.0)
MCH: 28.2 pg (ref 26.0–34.0)
MCHC: 34.9 g/dL (ref 30.0–36.0)
RDW: 12.3 % (ref 11.5–15.5)

## 2012-07-29 LAB — URINALYSIS, ROUTINE W REFLEX MICROSCOPIC
Bilirubin Urine: NEGATIVE
Ketones, ur: 15 mg/dL — AB
Nitrite: NEGATIVE
Specific Gravity, Urine: 1.025 (ref 1.005–1.030)
Urobilinogen, UA: 0.2 mg/dL (ref 0.0–1.0)

## 2012-07-29 LAB — URINE MICROSCOPIC-ADD ON

## 2012-07-29 MED ORDER — HYDRALAZINE HCL 25 MG PO TABS: 50.0000 mg | ORAL_TABLET | Freq: Three times a day (TID) | ORAL | Status: DC

## 2012-07-29 MED ORDER — HYDRALAZINE HCL 50 MG PO TABS: 50.0000 mg | ORAL_TABLET | Freq: Three times a day (TID) | ORAL | Status: DC

## 2012-07-29 MED ORDER — BENZONATATE 200 MG PO CAPS: 200.0000 mg | ORAL_CAPSULE | Freq: Three times a day (TID) | ORAL | Status: DC | PRN

## 2012-07-29 MED ORDER — POTASSIUM CHLORIDE CRYS ER 20 MEQ PO TBCR
40.0000 meq | EXTENDED_RELEASE_TABLET | Freq: Once | ORAL | Status: AC
  Administered 2012-07-29: 40 meq via ORAL
  Filled 2012-07-29: qty 2

## 2012-07-29 NOTE — Progress Notes (Signed)
D/c instructions reviewed with patient and family.  Verbalized understanding.  Patient d/c'd to home with family.  Schonewitz, Eulis Canner 07/29/2012

## 2012-07-29 NOTE — Discharge Summary (Signed)
Physician Discharge Summary  JAMESJOSEPH BAUN Sr. [id number] DOB: July 25, 1956 DOA: 07/28/2012  PCP: Axel Filler, MD  Admit date: 07/28/2012 Discharge date: 07/29/2012  Time spent: Less than 30 minutes  Recommendations for Outpatient Follow-up:  1. Follow with her primary care physician.  Discharge Diagnoses:  1. Hypertensive crisis, blood pressure improved. 2. Noncompliance with antihypertensive medications. 3. Type 2 diabetes mellitus 4. History of congestive heart failure, clinically compensated. 5. History of CVA, no new CVA on this admission. 6. Upper respiratory tract infection. 7. Obstructive sleep apnea. 8. Obesity.   Discharge Condition: Stable.  Diet recommendation: Carbohydrate modified diet.  Filed Weights   07/28/12 1529 07/29/12 0550  Weight: 100.699 kg (222 lb) 106.9 kg (235 lb 10.8 oz)    History of present illness:  This 56 year old man presented to the hospital with symptoms of an upper respiratory tract infection but was found to have significantly elevated blood pressure. Please see initial history as outlined below: [name] Sr. is an 56 y.o. male. Middle-aged [place] American gentleman, multiple medical recently discharged after management for an acute stroke, presents complaining of a persistent upper respiratory tract symptoms she has been going through his family feels he picked it up from one of the young children. In the emergency room patient's was not found to have any serious pulmonary issue, but his blood pressure was noted to be 210/110, and did not respond to oral challenge of clonidine 0.1 mg, so the hospitalist service was called to assist with management.  A cough, runny nose, sinus congestion patient denies any other issues. Denies fever headache nausea vomiting or diarrhea. He reports he has been taking his blood pressure medications regularly but has not taken this evening's dose as yet.  recently had a sleep study and was  diagnosed with obstructive sleep apnea requiring continuous pressure of 9 cm h20, during sleep  Hospital Course:  The patient was admitted overnight and his home medications were reinstituted. He admitted to me that he has been noncompliant with taking his medications and he cannot even remember which ones he has not taken or taken. In any case, his blood pressure has improved with instituting his home medications but is somewhat still in control. I am adjusting his hydralazine and increasing it to 50 mg 3 times a day. He can then followup with his primary care physician as an outpatient. He has no symptoms or signs of hypertensive encephalopathy. I stressed to him the importance of taking his antihypertensive medications, otherwise he has clearly an increased risk of repeat stroke.  Procedures:  None.   Consultations:  None.  Discharge Exam: Filed Vitals:   07/28/12 2114 07/28/12 2138 07/29/12 0231 07/29/12 0550  BP: 184/87 206/96 166/86 171/92  Pulse: 96 92 86 79  Temp:  97.9 F (36.6 C) 97.7 F (36.5 C) 98 F (36.7 C)  TempSrc:  Oral Oral Oral  Resp: 26 22 20 20   Height:  6' (1.829 m)    Weight:    106.9 kg (235 lb 10.8 oz)  SpO2: 97% 96% 95% 97%    General: He looks systemically well. He is alert and orientated. There are no focal neurological signs. Cardiovascular: Heart sounds are present without gallop rhythm or murmurs. Respiratory: Lung fields are clear.  Discharge Instructions  Discharge Orders    Future Orders Please Complete By Expires   Diet - low sodium heart healthy      Increase activity slowly  Medication List     As of 07/29/2012  9:34 AM    TAKE these medications         amLODipine 10 MG tablet   Commonly known as: NORVASC   Take 10 mg by mouth daily.      benzonatate 200 MG capsule   Commonly known as: TESSALON   Take 1 capsule (200 mg total) by mouth 3 (three) times daily as needed for cough.      carvedilol 25 MG tablet    Commonly known as: COREG   Take 25 mg by mouth 2 (two) times daily with a meal.      cloNIDine 0.1 MG tablet   Commonly known as: CATAPRES   Take 0.3 mg by mouth 2 (two) times daily.      clopidogrel 75 MG tablet   Commonly known as: PLAVIX   Take 1 tablet (75 mg total) by mouth daily with breakfast.      digoxin 0.25 MG tablet   Commonly known as: LANOXIN   Take 0.25 mg by mouth daily.      fenofibrate 48 MG tablet   Commonly known as: TRICOR   Take 1 tablet (48 mg total) by mouth daily.      furosemide 40 MG tablet   Commonly known as: LASIX   Take 60 mg by mouth daily.      gabapentin 300 MG capsule   Commonly known as: NEURONTIN   Take 300 mg by mouth daily.      hydrALAZINE 50 MG tablet   Commonly known as: APRESOLINE   Take 1 tablet (50 mg total) by mouth 3 (three) times daily.      insulin glargine 100 UNIT/ML injection   Commonly known as: LANTUS   Inject 20 Units into the skin at bedtime.      lisinopril 40 MG tablet   Commonly known as: PRINIVIL,ZESTRIL   Take 40 mg by mouth daily.      metFORMIN 1000 MG tablet   Commonly known as: GLUCOPHAGE   Take 1,000 mg by mouth 2 (two) times daily.      multivitamin with minerals Tabs   Take 1 tablet by mouth daily.      pravastatin 40 MG tablet   Commonly known as: PRAVACHOL   Take 40 mg by mouth daily.          The results of significant diagnostics from this hospitalization (including imaging, microbiology, ancillary and laboratory) are listed below for reference.    Significant Diagnostic Studies: Dg Chest 2 View  07/28/2012  *RADIOLOGY REPORT*  Clinical Data: Cough and fever.  CHEST - 2 VIEW  Comparison: 07/03/2012  Findings: Heart size is within normal limits.  Both lungs are clear.  No evidence of pleural effusion.  No mass or lymphadenopathy identified.  Ectasia of thoracic aorta is stable.  IMPRESSION: Stable exam.  No active disease.   Original Report Authenticated By: Earle Gell, M.D.    Dg  Chest 2 View  07/03/2012  *RADIOLOGY REPORT*  Clinical Data: Weakness, history cardiomyopathy, hypertension, diabetes  CHEST - 2 VIEW  Comparison: 08/23/2011  Findings: Upper-normal size of cardiac silhouette. Mediastinal contours and pulmonary vascularity normal. Lungs clear. No pleural effusion or pneumothorax. No acute osseous findings. Endplate spur formation thoracic spine.  IMPRESSION: No acute abnormalities.   Original Report Authenticated By: Lavonia Dana, M.D.    Ct Head Wo Contrast  07/04/2012  *RADIOLOGY REPORT*  Clinical Data: Weakness  CT HEAD WITHOUT CONTRAST  Technique:  Contiguous axial images were obtained from the base of the skull through the vertex without contrast.  Comparison: 10/10/2007 MRI  Findings: Lobular 2.8 x 1.8 cm hypoattenuation within the right basal ganglia, centered at the anterior limb internal capsule and caudate head.  There is mild mass effect on the frontal horn of the right lateral ventricle.  No intraparenchymal hemorrhage.  No hydrocephalous.  No abnormal extra-axial fluid collection. There/debris within the right sphenoid chamber.  Partial ethmoid air cell opacification.  IMPRESSION: Right basal ganglia hypodensity with mild mass effect, may reflect a subacute infarction or underlying lesion. Recommend MRI for further characterization.  Right sphenoid chamber and scattered ethmoid air cell opacification may reflect acute sinusitis.  Case discussed via telephone with Dr. Roxanne Mins at [id number] p.m. on 07/03/2012.   Original Report Authenticated By: Carlos Levering, M.D.    Mr [facility] Wo Contrast  07/04/2012  *RADIOLOGY REPORT*  Clinical Data:  Abnormal head CT  MRI HEAD WITHOUT CONTRAST MRA HEAD WITHOUT CONTRAST  Technique:  Multiplanar, multiecho pulse sequences of the brain and surrounding structures were obtained without intravenous contrast. Angiographic images of the head were obtained using MRA technique without contrast.  Comparison:  CT 07/03/2012  MRI HEAD   Findings:  Acute infarct in the right basal ganglia involving the anterior putamen, anterior limb internal capsule, and head of the caudate.  This measures approximately  2.5 cm in diameter.  No other areas of acute infarct.  Minimal chronic microvascular ischemic changes in the cerebral white matter.  Brainstem and cerebellum are intact.  Negative for hemorrhage or mass.  Chronic sinusitis.  IMPRESSION: Acute infarct right lenticular nuclei, corresponding to the CT hypodensity.  Negative for intracranial hemorrhage  MRA HEAD  Findings: Dominant right vertebral artery which is widely patent to the basilar.  Nondominant left vertebral artery contains a mild to moderate stenosis distally.  The basilar is patent.  Superior cerebellar arteries are patent bilaterally.  Patent posterior communicating artery bilaterally.  Patent posterior cerebral artery bilaterally with mild distal atherosclerotic disease.  Hypoplastic left P1 segment.  Internal carotid artery is patent with atherosclerotic disease in the cavernous segment on the right causing moderate stenosis. Anterior and middle cerebral arteries are patent bilaterally.  Mild to moderate atherosclerotic disease of the middle cerebral artery bifurcation bilaterally.  Both anterior cerebral arteries are patent without stenosis.  Negative for cerebral aneurysm.  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  No large vessel occlusion.   Original Report Authenticated By: Carl Best, M.D.    Mr Jeri Cos Wo Contrast  07/04/2012  *RADIOLOGY REPORT*  Clinical Data:  Abnormal head CT  MRI HEAD WITHOUT CONTRAST MRA HEAD WITHOUT CONTRAST  Technique:  Multiplanar, multiecho pulse sequences of the brain and surrounding structures were obtained without intravenous contrast. Angiographic images of the head were obtained using MRA technique without contrast.  Comparison:  CT 07/03/2012  MRI HEAD  Findings:  Acute infarct in the right basal ganglia  involving the anterior putamen, anterior limb internal capsule, and head of the caudate.  This measures approximately  2.5 cm in diameter.  No other areas of acute infarct.  Minimal chronic microvascular ischemic changes in the cerebral white matter.  Brainstem and cerebellum are intact.  Negative for hemorrhage or mass.  Chronic sinusitis.  IMPRESSION: Acute infarct right lenticular nuclei, corresponding to the CT hypodensity.  Negative for intracranial hemorrhage  MRA HEAD  Findings: Dominant right vertebral artery which is widely patent to the  basilar.  Nondominant left vertebral artery contains a mild to moderate stenosis distally.  The basilar is patent.  Superior cerebellar arteries are patent bilaterally.  Patent posterior communicating artery bilaterally.  Patent posterior cerebral artery bilaterally with mild distal atherosclerotic disease.  Hypoplastic left P1 segment.  Internal carotid artery is patent with atherosclerotic disease in the cavernous segment on the right causing moderate stenosis. Anterior and middle cerebral arteries are patent bilaterally.  Mild to moderate atherosclerotic disease of the middle cerebral artery bifurcation bilaterally.  Both anterior cerebral arteries are patent without stenosis.  Negative for cerebral aneurysm.  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  No large vessel occlusion.   Original Report Authenticated By: Carl Best, M.D.    US Carotid Duplex Bilateral  07/04/2012  *RADIOLOGY REPORT*  Clinical Data: Right brain acute infarct. Hypertension, coronary artery disease, diabetes.  BILATERAL CAROTID DUPLEX ULTRASOUND  Technique: Pearline Cables scale imaging, color Doppler and duplex ultrasound was performed of bilateral carotid and vertebral arteries in the neck.  Comparison: None available  Criteria:  Quantification of carotid stenosis is based on velocity parameters that correlate the residual internal carotid diameter with NASCET-based  stenosis levels, using the diameter of the distal internal carotid lumen as the denominator for stenosis measurement.  The following velocity measurements were obtained:                   PEAK SYSTOLIC/END DIASTOLIC RIGHT ICA:                        56/18cm/sec CCA:                        [id number] SYSTOLIC ICA/CCA RATIO:     [id number] DIASTOLIC ICA/CCA RATIO:    2.3 ECA:                        52cm/sec  LEFT ICA:                        86/28cm/sec CCA:                        [id number] SYSTOLIC ICA/CCA RATIO:     0.8 DIASTOLIC ICA/CCA RATIO:    1.97 ECA:                        156cm/sec  Findings:  RIGHT CAROTID ARTERY: The common carotid artery is tortuous.  There is mild intimal thickening without focal plaque accumulation or stenosis.  Normal wave forms and color Doppler signal.  There is a high bifurcation.  RIGHT VERTEBRAL ARTERY:  Normal flow direction and waveform.  LEFT CAROTID ARTERY: Mild intimal thickening in the bulb without focal plaque accumulation or stenosis.  Normal wave forms and color Doppler signal.  High bifurcation.  LEFT VERTEBRAL ARTERY:  Normal flow direction and waveform.  IMPRESSION:  1.  No significant carotid bifurcation or proximal ICA plaque or stenosis.   Original Report Authenticated By: D. Wallace Going, MD         Labs: Basic Metabolic Panel:  Lab [id number] 07/28/12 1628  NA 137 132*  K 3.4* 3.5  CL 98 94*  CO2 31 30  GLUCOSE 251* 393*  BUN 11 9  CREATININE 0.86 0.85  CALCIUM 8.6 9.0  MG -- --  PHOS -- --   Liver  Function Tests:  Lab 07/28/12 1628  AST 15  ALT 11  ALKPHOS 78  BILITOT 0.3  PROT 7.3  ALBUMIN 3.1*     CBC:  Lab 07/29/12 0625 07/28/12 1628  WBC 6.9 6.6  NEUTROABS -- 3.8  HGB 13.1 13.8  HCT 37.5* 39.0  MCV 80.6 80.7  PLT 329 307   Cardiac Enzymes:  Lab 07/28/12 1628  CKTOTAL --  CKMB --  CKMBINDEX --  TROPONINI <0.30     CBG:  Lab 07/29/12 0803 07/28/12 2159 07/28/12 1947  GLUCAP 231* 199* 213*        Signed:  [place] [facility]  [facility] 07/29/2012, 9:34 AM

## 2012-07-30 ENCOUNTER — Emergency Department (HOSPITAL_COMMUNITY)
Admission: EM | Admit: 2012-07-30 | Discharge: 2012-07-30 | Disposition: A | Discharge status: Home / Self Care | Payer: Medicare Other | Attending: Emergency Medicine | Admitting: Emergency Medicine

## 2012-07-30 ENCOUNTER — Telehealth: Payer: Self-pay | Admitting: *Deleted

## 2012-07-30 ENCOUNTER — Encounter (HOSPITAL_COMMUNITY): Payer: Self-pay | Admitting: *Deleted

## 2012-07-30 DIAGNOSIS — E1142 Type 2 diabetes mellitus with diabetic polyneuropathy: Secondary | ICD-10-CM | POA: Insufficient documentation

## 2012-07-30 DIAGNOSIS — E785 Hyperlipidemia, unspecified: Secondary | ICD-10-CM | POA: Insufficient documentation

## 2012-07-30 DIAGNOSIS — R51 Headache: Secondary | ICD-10-CM | POA: Insufficient documentation

## 2012-07-30 DIAGNOSIS — L259 Unspecified contact dermatitis, unspecified cause: Secondary | ICD-10-CM | POA: Insufficient documentation

## 2012-07-30 DIAGNOSIS — E1149 Type 2 diabetes mellitus with other diabetic neurological complication: Secondary | ICD-10-CM | POA: Insufficient documentation

## 2012-07-30 DIAGNOSIS — Z8673 Personal history of transient ischemic attack (TIA), and cerebral infarction without residual deficits: Secondary | ICD-10-CM | POA: Insufficient documentation

## 2012-07-30 DIAGNOSIS — I10 Essential (primary) hypertension: Secondary | ICD-10-CM | POA: Insufficient documentation

## 2012-07-30 DIAGNOSIS — I509 Heart failure, unspecified: Secondary | ICD-10-CM | POA: Insufficient documentation

## 2012-07-30 DIAGNOSIS — H9190 Unspecified hearing loss, unspecified ear: Secondary | ICD-10-CM | POA: Insufficient documentation

## 2012-07-30 DIAGNOSIS — R1110 Vomiting, unspecified: Secondary | ICD-10-CM | POA: Insufficient documentation

## 2012-07-30 MED ORDER — DIPHENHYDRAMINE HCL 50 MG/ML IJ SOLN
25.0000 mg | Freq: Once | INTRAMUSCULAR | Status: AC
  Administered 2012-07-30: 25 mg via INTRAVENOUS
  Filled 2012-07-30: qty 1

## 2012-07-30 MED ORDER — METOCLOPRAMIDE HCL 10 MG PO TABS: 10.0000 mg | ORAL_TABLET | Freq: Four times a day (QID) | ORAL | Status: DC | PRN

## 2012-07-30 MED ORDER — SODIUM CHLORIDE 0.9 % IV BOLUS (SEPSIS)
1000.0000 mL | Freq: Once | INTRAVENOUS | Status: AC
  Administered 2012-07-30: 1000 mL via INTRAVENOUS

## 2012-07-30 MED ORDER — METOCLOPRAMIDE HCL 5 MG/ML IJ SOLN
10.0000 mg | Freq: Once | INTRAMUSCULAR | Status: AC
  Administered 2012-07-30: 10 mg via INTRAVENOUS
  Filled 2012-07-30: qty 2

## 2012-07-30 NOTE — ED Provider Notes (Signed)
History    This chart was scribed for Delora Fuel, MD, MD by Rhae Lerner, ED Scribe. The patient was seen in room APA16A and the patient's care was started at 5:23PM.   CSN: [id number]  Arrival date & time 07/30/12  1500      Chief Complaint  Patient presents with  . Emesis  . Headache    (Consider location/radiation/quality/duration/timing/severity/associated sxs/prior treatment) The history is provided by the patient. No language interpreter was used.   Charles Ferries Sr. is a 56 y.o. male with hx of CHF, DM, HTN, CVA and DM neuropathy who presents to the Emergency Department complaining of constant, moderate bifrontal headache onset today 15 hours ago. He reports the headache is throbbing. He reports that pain is currently 7/10 and at worst 10/10. He had emesis and nausea today. He denies photophobia, visual disturbance, fever, diarrhea, chest pain, weakness, numbness, SOB and any other pain. Pt reports that certain smells aggravate headache. Pt reports that he checked BP today and it was 180/89. He states that he has not checked his BP in several months before today. Denies smoking cigarettes and drinking alcohol. Pt is scheduled to see Dr. Marinda Elk in February 2014.   Past Medical History  Diagnosis Date  . Dermatitis   . CHF (congestive heart failure)     LV function improved from 2004 to 2008.  Historically, moderately dilated LV with EF 30-40% by 2D echo 08/14/2002.  Mild CAD with severe LV dysfunction by cardiac cath 09/2002.  Normal coronary arteries and normal LV function by cardiac cath 09/19/2006.  A 2-D echo on 04/01/2009 showed mild concentric hypertrophy and normal systolic (LVEF  123456) and doppler C/W with grade 1 diastolic dysfunction.  . Hypertension   . Diabetes mellitus   . Hyperlipidemia   . Hearing loss in right ear   . Cardiomyopathy     LV function improved from 2004 to 2008.  Historically, moderately dilated LV with EF 30-40% by 2D echo 08/14/2002.  Mild CAD  with severe LV dysfunction by cardiac cath 09/2002.  Normal coronary arteries and normal LV function by cardiac cath 09/19/2006.  A 2-D echo on 04/01/2009 showed mild concentric hypertrophy and normal systolic (LVEF  123456) and doppler C/W with grade 1 diastolic dysfunction.  . DM neuropathy, painful   . CVA (cerebral vascular accident) 07/04/2012    MRI of the brain 07/04/2012 showed an acute infarct in the right basal ganglia involving the anterior putamen, anterior limb internal capsule, and head of the caudate; this measured approximately 2.5 cm in diameter.       Past Surgical History  Procedure Date  . Colonoscopy   . Polypectomy   . Cardiac catheterization     3 times  . Foot surgery     Family History  Problem Relation Age of Onset  . Colon cancer Sister   . Aneurysm Father 43    died of rupture    History  Substance Use Topics  . Smoking status: Never Smoker   . Smokeless tobacco: Never Used  . Alcohol Use: No      Review of Systems  All other systems reviewed and are negative.    Allergies  Review of patient's allergies indicates no known allergies.  Home Medications   Current Outpatient Rx  Name  Route  Sig  Dispense  Refill  . AMLODIPINE BESYLATE 10 MG PO TABS   Oral   Take 10 mg by mouth daily.         [name]  BENZONATATE 200 MG PO CAPS   Oral   Take 1 capsule (200 mg total) by mouth 3 (three) times daily as needed for cough.   20 capsule   0   . CARVEDILOL 25 MG PO TABS   Oral   Take 25 mg by mouth 2 (two) times daily with a meal.         . CLONIDINE HCL 0.1 MG PO TABS   Oral   Take 0.3 mg by mouth 2 (two) times daily.         [name] CLOPIDOGREL BISULFATE 75 MG PO TABS   Oral   Take 1 tablet (75 mg total) by mouth daily with breakfast.   30 tablet   1   . DIGOXIN 0.25 MG PO TABS   Oral   Take 0.25 mg by mouth daily.         . FENOFIBRATE 48 MG PO TABS   Oral   Take 1 tablet (48 mg total) by mouth daily.   30 tablet   5   .  FUROSEMIDE 40 MG PO TABS   Oral   Take 60 mg by mouth daily.         [name] GABAPENTIN 300 MG PO CAPS   Oral   Take 300 mg by mouth daily.         [name] HYDRALAZINE HCL 50 MG PO TABS   Oral   Take 1 tablet (50 mg total) by mouth 3 (three) times daily.   90 tablet   0   . INSULIN GLARGINE 100 UNIT/ML [place] SOLN   Subcutaneous   Inject 20 Units into the skin at bedtime.   10 mL   5   . LISINOPRIL 40 MG PO TABS   Oral   Take 40 mg by mouth daily.         [name] METFORMIN HCL 1000 MG PO TABS   Oral   Take 1,000 mg by mouth 2 (two) times daily.         . ADULT MULTIVITAMIN W/MINERALS CH   Oral   Take 1 tablet by mouth daily.           [name] PRAVASTATIN SODIUM 40 MG PO TABS   Oral   Take 40 mg by mouth daily.           BP 179/94  Pulse 73  Temp 98 F (36.7 C) (Oral)  Resp 20  Ht 6' (1.829 m)  Wt 222 lb (100.699 kg)  BMI 30.11 kg/m2  SpO2 97%  Physical Exam  Nursing note and vitals reviewed. Constitutional: He is oriented to person, place, and time. He appears well-developed and well-nourished. No distress.  HENT:  Head: Normocephalic and atraumatic.       Mild tenderness over temporalis muscles   Eyes:       Fundi have no hemorrhage or exudate   Neck: Normal range of motion. Neck supple. No tracheal deviation present.  Cardiovascular: Normal rate.   Pulmonary/Chest: Effort normal. No respiratory distress.  Abdominal: Soft. He exhibits no distension.  Musculoskeletal: Normal range of motion.  Neurological: He is alert and oriented to person, place, and time.  Skin: Skin is warm and dry.  Psychiatric: He has a normal mood and affect. His behavior is normal.    ED Course  Procedures (including critical care time) DIAGNOSTIC STUDIES: Oxygen Saturation is 97% on room air, adequate by my interpretation.    COORDINATION OF CARE: 5:27 PM Discussed ED treatment with pt  5:28 PM Ordered:     . diphenhydrAMINE  25 mg Intravenous Once  . metoCLOPramide  10 mg  Intravenous Once     1. Headache   2. Hypertension       MDM  Headache which I do not believe is due to his hypertension. Blood pressure is elevated, but not to a level that would be required to cause a headache. There's no evidence of hypertensive encephalopathy on exam. He will be treated with IV metoclopramide and reassessed. Old records are reviewed, and he was recently hospitalized with headache which was felt to be due to hypertensive urgency.  He got good relief of headache with IV fluids and IV metoclopramide. He is advised to monitor his blood pressure everyday and keep a log and take that with him when he follows up with his PCP. He is advised to followup with his PCP in the next one to 2 weeks. He sent home with prescription for metoclopramide.    I personally performed the services described in this documentation, which was scribed in my presence. The recorded information has been reviewed and is accurate.        Delora Fuel, MD [id number]

## 2012-07-30 NOTE — ED Notes (Signed)
Pt with headache and emesis since today, recent discharge yesterday with HTN and DM per pt.

## 2012-07-30 NOTE — Telephone Encounter (Signed)
Call from patient c/o headaches, nausea, and vomiting.  Pt denies vision changes, shortness of breath.  Had 3 episodes of N&V today, recently seen at [facility] ED on 07/28/12 for hypertensive crisis. Spoke with the attending MD here in the clinic and pt was advised to return to ED today for follow up as the clinic  does not have any available openings today.  Pt wanted to "wait" and see if his symptoms improved, although he was advised to follow-up today.  He was also advised to call 911 right away with any worsening symptoms such as shortness of breath, chest pain, or vision changes.Regenia Skeeter, Mariko Nowakowski Cassady12/16/20131:45 PM

## 2012-07-30 NOTE — Telephone Encounter (Signed)
Thanks Agree 

## 2012-08-10 ENCOUNTER — Ambulatory Visit: Payer: Medicare Other | Admitting: Internal Medicine

## 2012-08-16 NOTE — Progress Notes (Signed)
UR Chart Review Completed  

## 2012-09-04 ENCOUNTER — Other Ambulatory Visit: Payer: Self-pay | Admitting: Internal Medicine

## 2012-09-07 ENCOUNTER — Other Ambulatory Visit: Payer: Self-pay | Admitting: *Deleted

## 2012-09-07 MED ORDER — ACCU-CHEK FASTCLIX LANCETS MISC: 1.0000 | Freq: Two times a day (BID) | Status: DC

## 2012-09-12 ENCOUNTER — Other Ambulatory Visit: Payer: Self-pay | Admitting: Internal Medicine

## 2012-09-19 ENCOUNTER — Encounter: Payer: Self-pay | Admitting: Internal Medicine

## 2012-09-19 ENCOUNTER — Ambulatory Visit (INDEPENDENT_AMBULATORY_CARE_PROVIDER_SITE_OTHER): Payer: Medicare Other | Admitting: Internal Medicine

## 2012-09-19 VITALS — BP 142/76 | HR 76 | Temp 97.1°F | Ht 72.0 in | Wt 221.2 lb

## 2012-09-19 DIAGNOSIS — I10 Essential (primary) hypertension: Secondary | ICD-10-CM

## 2012-09-19 DIAGNOSIS — G4733 Obstructive sleep apnea (adult) (pediatric): Secondary | ICD-10-CM

## 2012-09-19 DIAGNOSIS — E119 Type 2 diabetes mellitus without complications: Secondary | ICD-10-CM

## 2012-09-19 DIAGNOSIS — E785 Hyperlipidemia, unspecified: Secondary | ICD-10-CM

## 2012-09-19 DIAGNOSIS — Z79899 Other long term (current) drug therapy: Secondary | ICD-10-CM

## 2012-09-19 LAB — COMPLETE METABOLIC PANEL WITH GFR
AST: 18 U/L (ref 0–37)
Albumin: 4 g/dL (ref 3.5–5.2)
Alkaline Phosphatase: 39 U/L (ref 39–117)
Chloride: 100 mEq/L (ref 96–112)
Potassium: 3.6 mEq/L (ref 3.5–5.3)
Sodium: 140 mEq/L (ref 135–145)
Total Protein: 6.5 g/dL (ref 6.0–8.3)

## 2012-09-19 LAB — LIPID PANEL
LDL Cholesterol: 81 mg/dL (ref 0–99)
Total CHOL/HDL Ratio: 3.9 Ratio
VLDL: 24 mg/dL (ref 0–40)

## 2012-09-19 LAB — POCT GLYCOSYLATED HEMOGLOBIN (HGB A1C): Hemoglobin A1C: 6.4

## 2012-09-19 MED ORDER — ONETOUCH ULTRASOFT LANCETS MISC: Status: DC

## 2012-09-19 NOTE — Assessment & Plan Note (Addendum)
BP Readings from Last 3 Encounters:  09/19/12 142/76  07/30/12 190/96  07/29/12 162/84    Lab Results  Component Value Date   NA 137 07/29/2012   K 3.4* 07/29/2012   CREATININE 0.86 07/29/2012    Assessment:  Blood pressure control: mildly elevated  Progress toward BP goal:  improved  Comments: blood pressure is very mildly elevated, but substantially improved from past visit.  Patient reports that he is compliant with his medications.  Plan: Medications:  continue current medications (amlodipine 10 mg daily, carvedilol 25 mg twice a day, clonidine 0.3 mg twice a day, furosemide 60 mg daily, hydralazine 50       mg 3 times a day, and lisinopril 40 mg daily)  Educational resources provided: brochure  Self management tools provided: home blood pressure logbook  Other plans: as above, check a metabolic panel today

## 2012-09-19 NOTE — Patient Instructions (Addendum)
General Instructions: Continue current medications.   Treatment Goals:  Goals (1 Years of Data) as of 09/19/2012          As of Today 07/30/12 07/30/12 07/29/12 07/29/12     Blood Pressure    . Blood Pressure < 140/80  142/76 190/96 179/94 162/84 171/92     Result Component    . HEMOGLOBIN A1C < 7.0  6.4        . LDL CALC < 70            Progress Toward Treatment Goals:  Treatment Goal 09/19/2012  Hemoglobin A1C at goal  Blood pressure improved    Self Care Goals & Plans:  Self Care Goal 09/19/2012  Manage my medications take my medicines as prescribed; bring my medications to every visit  Monitor my health keep track of my blood glucose; bring my glucose meter and log to each visit  Eat healthy foods eat fruit for snacks and desserts  Be physically active -    Home Blood Glucose Monitoring 09/19/2012  Check my blood sugar 2 times a day  When to check my blood sugar before breakfast; before dinner     Care Management & Community Referrals:  Referral 09/19/2012  Referrals made for care management support none needed  Referrals made to community resources other (see comments)

## 2012-09-19 NOTE — Assessment & Plan Note (Addendum)
Lab Results  Component Value Date   HGBA1C 6.4 09/19/2012   HGBA1C 8.2* 07/04/2012   HGBA1C 10.6 04/18/2012     Assessment:  Diabetes control: good control (HgbA1C at goal)  Progress toward A1C goal:  at goal  Comments: Patient's control has substantially improved on Lantus 15 units daily at bedtime and metformin 1000 mg twice a day.  Plan:  Medications:  continue current medications  Home glucose monitoring:   Frequency: 2 times a day   Timing: before breakfast;before dinner  Instruction/counseling given: reminded to bring blood glucose meter & log to each visit, reminded to bring medications to each visit and discussed foot care  Educational resources provided: brochure  Self management tools provided: copy of home glucose meter download  Other plans: check a comprehensive metabolic panel today

## 2012-09-19 NOTE — Assessment & Plan Note (Addendum)
Lipids:    Component Value Date/Time   CHOL 129 07/04/2012 0508   TRIG 610* 07/04/2012 0508   HDL 28* 07/04/2012 0508   LDLCALC UNABLE TO CALCULATE IF TRIGLYCERIDE OVER 400 mg/dL 07/04/2012 0508   LDLDIRECT 80 07/18/2012 1010   VLDL UNABLE TO CALCULATE IF TRIGLYCERIDE OVER 400 mg/dL 07/04/2012 0508   CHOLHDL 4.6 07/04/2012 0508    Assessment: Patient is doing well on fenofibrate (TriCor) 48 mg daily with no apparent side effects.  Control of diabetes has substantially improved.  Plan: Will check a lipid panel today; continue fenofibrate 48 mg daily pending that result.

## 2012-09-19 NOTE — Progress Notes (Signed)
  Subjective:    Patient ID: Charles Hart., male    DOB: September 09, 1955, 57 y.o.   MRN: [id number]  HPI Patient presents for followup of his hypertension, diabetes mellitus, hyperlipidemia, and other chronic medical problems.  He has no acute complaints today and reports that he has been doing well.  He did not bring his medications to clinic; he reports that he has been compliant with his medications, and he confirmed his medication list from memory.   Review of Systems  Constitutional: Negative for fever and diaphoresis.  Respiratory: Negative for cough, shortness of breath and wheezing.   Cardiovascular: Positive for leg swelling (mild). Negative for chest pain.  Gastrointestinal: Negative for nausea, vomiting, abdominal pain and blood in stool.  Genitourinary: Negative for dysuria and frequency.  Musculoskeletal: Negative for myalgias and arthralgias.  Neurological: Negative for weakness and numbness.       Objective:   Physical Exam  Constitutional: No distress.  Cardiovascular: Normal rate, regular rhythm and normal heart sounds.  Exam reveals no gallop and no friction rub.   No murmur heard. Pulmonary/Chest: Effort normal and breath sounds normal. No respiratory distress. He has no wheezes. He has no rales.  Abdominal: Soft. Bowel sounds are normal. He exhibits no distension. There is no tenderness. There is no rebound and no guarding.  Musculoskeletal: He exhibits no edema.       Assessment & Plan:

## 2012-09-19 NOTE — Assessment & Plan Note (Signed)
Assessment: Patient reports that he is using his CPAP at night, and that he feels better in the daytime with less somnolence and no headache.  Plan: Continue CPAP.

## 2012-10-04 ENCOUNTER — Encounter: Payer: Self-pay | Admitting: *Deleted

## 2012-10-04 ENCOUNTER — Ambulatory Visit (INDEPENDENT_AMBULATORY_CARE_PROVIDER_SITE_OTHER): Payer: Medicare Other | Admitting: Cardiovascular Disease

## 2012-10-04 ENCOUNTER — Encounter: Payer: Self-pay | Admitting: Cardiovascular Disease

## 2012-10-04 VITALS — BP 150/80 | HR 72 | Ht 72.0 in | Wt 221.0 lb

## 2012-10-04 DIAGNOSIS — I509 Heart failure, unspecified: Secondary | ICD-10-CM

## 2012-10-04 DIAGNOSIS — E119 Type 2 diabetes mellitus without complications: Secondary | ICD-10-CM

## 2012-10-04 DIAGNOSIS — I10 Essential (primary) hypertension: Secondary | ICD-10-CM

## 2012-10-04 DIAGNOSIS — E785 Hyperlipidemia, unspecified: Secondary | ICD-10-CM

## 2012-10-04 NOTE — Patient Instructions (Addendum)
Your physician recommends that you schedule a follow-up appointment in: 6 months  

## 2012-10-04 NOTE — Assessment & Plan Note (Signed)
Discussed low carb diet.  Target hemoglobin A1c is 6.5 or less.  Continue current medications.  

## 2012-10-04 NOTE — Assessment & Plan Note (Signed)
EF normal by last echo Improved with med compliance

## 2012-10-04 NOTE — Assessment & Plan Note (Signed)
Well controlled.  Continue current medications and low sodium Dash type diet.    

## 2012-10-04 NOTE — Assessment & Plan Note (Signed)
Cholesterol is at goal.  Continue current dose of statin and diet Rx.  No myalgias or side effects.  F/U  LFT's in 6 months. Lab Results  Component Value Date   LDLCALC 81 09/19/2012

## 2012-10-04 NOTE — Progress Notes (Signed)
Patient ID: Charles Hart., male   DOB: 19-Apr-1956, 57 y.o.   MRN: [id number] 57 yo with DM,HTN history of abnormal ECG and DCM. Previous EF 30-35% with poorly controlled BP. Improved to normal on echo 2010 with compliance of meds. No dyspnea,sscp,palpitatins,edema or syncope. Primary has stopped his actos and glipizide and started Byetta. Compliant with meds and BP has been good. ECG chronically abnormal with LVH changes. Saw Dr Katy Fitch in January and eyes are fine. No other end organ damage from DM. BS out of control in January due to med compliance issues. 2 grand daughters were living with him for a while and there was a lot of stress. Not working since September  Echo 11/03/10  Study Conclusions Left ventricle: The cavity size was mildly dilated. Wall thickness was normal. Systolic function was normal. The estimated ejection fraction was in the range of 55% to 60%. Wall motion was normal; there were no regional wall motion abnormalities. Transthoracic echocardiography. M-mode, complete 2D, spectral Doppler, and color Doppler. Height: Height: 180.3cm. Height: 71in. Weight: Weight: 115.2kg. Weight: 253.5lb. Body mass index: BMI: 35.4kg/m^2. Body surface area: BSA: 2.68m^2. Blood pressure: 137/67. Patient status: Outpatient. 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  All other systems reviewed and negative  General: Affect appropriate Overweight black male HEENT: normal Neck supple with no adenopathy JVP normal no bruits no thyromegaly Lungs clear with no wheezing and good diaphragmatic motion Heart:  S1/S2 no murmur, no rub, gallop or click PMI normal Abdomen: benighn, BS positve, no  tenderness, no AAA no bruit.  No HSM or HJR Distal pulses intact with no bruits No edema Neuro non-focal Skin warm and dry No muscular weakness   Current Outpatient Prescriptions  Medication Sig Dispense Refill  . amLODipine (NORVASC) 10 MG tablet Take 10 mg by mouth every morning.       . carvedilol (COREG) 25 MG tablet Take 25 mg by mouth 2 (two) times daily with a meal.      . clobetasol cream (TEMOVATE) [id number] % Apply 1 application topically 2 (two) times daily. Until clear per prescription direction      . cloNIDine (CATAPRES) 0.1 MG tablet Take 0.3 mg by mouth 2 (two) times daily.      . clopidogrel (PLAVIX) 75 MG tablet TAKE 1 TABLET EVERY DAY WITH BREAKFAST  30 tablet  3  . digoxin (LANOXIN) 0.25 MG tablet Take 0.25 mg by mouth daily.      . fenofibrate (TRICOR) 48 MG tablet Take 1 tablet (48 mg total) by mouth daily.  30 tablet  5  . furosemide (LASIX) 40 MG tablet Take 60 mg by mouth every morning.       . hydrALAZINE (APRESOLINE) 50 MG tablet Take 1 tablet (50 mg total) by mouth 3 (three) times daily.  90 tablet  0  . insulin glargine (LANTUS) 100 UNIT/ML injection Inject 15 Units into the skin at bedtime.      . Lancets (ONETOUCH ULTRASOFT) lancets Use as instructed to check blood sugar two times a day.  100 each  6  . lisinopril (PRINIVIL,ZESTRIL) 40 MG tablet Take 40 mg by mouth daily.      . metFORMIN (GLUCOPHAGE) 1000 MG tablet Take 1,000  mg by mouth 2 (two) times daily.      . Multiple Vitamin (MULITIVITAMIN WITH MINERALS) TABS Take 1 tablet by mouth every morning.       . pravastatin (PRAVACHOL) 40 MG tablet Take 40 mg by mouth every morning.       . gabapentin (NEURONTIN) 300 MG capsule Take 300 mg by mouth every evening.        No current facility-administered medications for this visit.    Allergies  Review of patient's allergies indicates no known allergies.  Electrocardiogram:  12/16  SR rate 87 LVH    Assessment and Plan

## 2012-10-13 ENCOUNTER — Other Ambulatory Visit: Payer: Self-pay | Admitting: Internal Medicine

## 2012-10-13 DIAGNOSIS — E1140 Type 2 diabetes mellitus with diabetic neuropathy, unspecified: Secondary | ICD-10-CM

## 2012-10-13 DIAGNOSIS — I10 Essential (primary) hypertension: Secondary | ICD-10-CM

## 2012-10-13 DIAGNOSIS — I428 Other cardiomyopathies: Secondary | ICD-10-CM

## 2012-10-23 ENCOUNTER — Other Ambulatory Visit: Payer: Self-pay | Admitting: Internal Medicine

## 2012-10-24 ENCOUNTER — Other Ambulatory Visit: Payer: Self-pay | Admitting: *Deleted

## 2012-10-24 MED ORDER — GLUCOSE BLOOD VI STRP: ORAL_STRIP | Status: DC

## 2012-11-24 ENCOUNTER — Emergency Department (HOSPITAL_COMMUNITY)
Admission: EM | Admit: 2012-11-24 | Discharge: 2012-11-24 | Disposition: A | Discharge status: Home / Self Care | Payer: Medicare Other | Attending: Emergency Medicine | Admitting: Emergency Medicine

## 2012-11-24 ENCOUNTER — Encounter (HOSPITAL_COMMUNITY): Payer: Self-pay

## 2012-11-24 ENCOUNTER — Emergency Department (HOSPITAL_COMMUNITY): Payer: Medicare Other

## 2012-11-24 DIAGNOSIS — E1142 Type 2 diabetes mellitus with diabetic polyneuropathy: Secondary | ICD-10-CM | POA: Insufficient documentation

## 2012-11-24 DIAGNOSIS — Z794 Long term (current) use of insulin: Secondary | ICD-10-CM | POA: Insufficient documentation

## 2012-11-24 DIAGNOSIS — I509 Heart failure, unspecified: Secondary | ICD-10-CM | POA: Insufficient documentation

## 2012-11-24 DIAGNOSIS — J019 Acute sinusitis, unspecified: Secondary | ICD-10-CM

## 2012-11-24 DIAGNOSIS — E785 Hyperlipidemia, unspecified: Secondary | ICD-10-CM | POA: Insufficient documentation

## 2012-11-24 DIAGNOSIS — E1149 Type 2 diabetes mellitus with other diabetic neurological complication: Secondary | ICD-10-CM | POA: Insufficient documentation

## 2012-11-24 DIAGNOSIS — Z8673 Personal history of transient ischemic attack (TIA), and cerebral infarction without residual deficits: Secondary | ICD-10-CM | POA: Insufficient documentation

## 2012-11-24 DIAGNOSIS — Z7902 Long term (current) use of antithrombotics/antiplatelets: Secondary | ICD-10-CM | POA: Insufficient documentation

## 2012-11-24 DIAGNOSIS — Z872 Personal history of diseases of the skin and subcutaneous tissue: Secondary | ICD-10-CM | POA: Insufficient documentation

## 2012-11-24 DIAGNOSIS — Z8679 Personal history of other diseases of the circulatory system: Secondary | ICD-10-CM | POA: Insufficient documentation

## 2012-11-24 DIAGNOSIS — I10 Essential (primary) hypertension: Secondary | ICD-10-CM | POA: Insufficient documentation

## 2012-11-24 DIAGNOSIS — Z9861 Coronary angioplasty status: Secondary | ICD-10-CM | POA: Insufficient documentation

## 2012-11-24 DIAGNOSIS — Z79899 Other long term (current) drug therapy: Secondary | ICD-10-CM | POA: Insufficient documentation

## 2012-11-24 DIAGNOSIS — H9190 Unspecified hearing loss, unspecified ear: Secondary | ICD-10-CM | POA: Insufficient documentation

## 2012-11-24 MED ORDER — AZITHROMYCIN 250 MG PO TABS: 250.0000 mg | ORAL_TABLET | Freq: Every day | ORAL | Status: DC

## 2012-11-24 NOTE — ED Provider Notes (Signed)
History     CSN: [id number]  Arrival date & time 11/24/12  0056   First MD Initiated Contact with Patient 11/24/12 [phone]      Chief Complaint  Patient presents with  . Cough    (Consider location/radiation/quality/duration/timing/severity/associated sxs/prior treatment) HPI Comments: Patient presents with sinus drainage and congestion for the past 5 days.  It is worsening to the point that he is unable to use his cpap at night.  No fevers or chills.  No chest pain.    Patient is a 57 y.o. male presenting with cough. The history is provided by the patient.  Cough Cough characteristics:  Productive Sputum characteristics:  Yellow Severity:  Moderate Onset quality:  Sudden Duration:  5 days Timing:  Constant Progression:  Worsening Chronicity:  New Smoker: no   Relieved by:  Nothing Worsened by:  Nothing tried Ineffective treatments:  None tried   Past Medical History  Diagnosis Date  . Dermatitis   . CHF (congestive heart failure)     LV function improved from 2004 to 2008.  Historically, moderately dilated LV with EF 30-40% by 2D echo 08/14/2002.  Mild CAD with severe LV dysfunction by cardiac cath 09/2002.  Normal coronary arteries and normal LV function by cardiac cath 09/19/2006.  A 2-D echo on 04/01/2009 showed mild concentric hypertrophy and normal systolic (LVEF  123456) and doppler C/W with grade 1 diastolic dysfunction.  . Hypertension   . Diabetes mellitus   . Hyperlipidemia   . Hearing loss in right ear   . Cardiomyopathy     LV function improved from 2004 to 2008.  Historically, moderately dilated LV with EF 30-40% by 2D echo 08/14/2002.  Mild CAD with severe LV dysfunction by cardiac cath 09/2002.  Normal coronary arteries and normal LV function by cardiac cath 09/19/2006.  A 2-D echo on 04/01/2009 showed mild concentric hypertrophy and normal systolic (LVEF  123456) and doppler C/W with grade 1 diastolic dysfunction.  . DM neuropathy, painful   . CVA (cerebral  vascular accident) 07/04/2012    MRI of the brain 07/04/2012 showed an acute infarct in the right basal ganglia involving the anterior putamen, anterior limb internal capsule, and head of the caudate; this measured approximately 2.5 cm in diameter.     . Hypertensive crisis 07/28/2012    Past Surgical History  Procedure Laterality Date  . Colonoscopy    . Polypectomy    . Cardiac catheterization      3 times  . Foot surgery      Family History  Problem Relation Age of Onset  . Colon cancer Sister   . Aneurysm Father 75    died of rupture    History  Substance Use Topics  . Smoking status: Never Smoker   . Smokeless tobacco: Never Used  . Alcohol Use: No      Review of Systems  Respiratory: Positive for cough.   All other systems reviewed and are negative.    Allergies  Review of patient's allergies indicates no known allergies.  Home Medications   Current Outpatient Rx  Name  Route  Sig  Dispense  Refill  . amLODipine (NORVASC) 10 MG tablet      TAKE 1 TABLET (10 MG TOTAL) BY MOUTH DAILY.   90 tablet   2   . azithromycin (ZITHROMAX) 250 MG tablet   Oral   Take 1 tablet (250 mg total) by mouth daily. Take first 2 tablets together, then 1 every day until finished.  6 tablet   0   . carvedilol (COREG) 25 MG tablet      TAKE 1 TABLET (25 MG TOTAL) BY MOUTH 2 (TWO) TIMES DAILY WITH A MEAL.   180 tablet   2   . clobetasol cream (TEMOVATE) 0.05 %   Topical   Apply 1 application topically 2 (two) times daily. Until clear per prescription direction         . cloNIDine (CATAPRES) 0.1 MG tablet   Oral   Take 0.3 mg by mouth 2 (two) times daily.         . clopidogrel (PLAVIX) 75 MG tablet      TAKE 1 TABLET EVERY DAY WITH BREAKFAST   30 tablet   3   . DIGOX 0.25 MG tablet      TAKE 1 TABLET (250 MCG TOTAL) BY MOUTH DAILY.   90 tablet   2   . fenofibrate (TRICOR) 48 MG tablet   Oral   Take 1 tablet (48 mg total) by mouth daily.   30 tablet    5   . furosemide (LASIX) 40 MG tablet   Oral   Take 60 mg by mouth every morning.          . gabapentin (NEURONTIN) 300 MG capsule      TAKE 1 CAPSULE (300 MG TOTAL) BY MOUTH DAILY.   90 capsule   2   . glucose blood (ONE TOUCH ULTRA TEST) test strip      Use as instructed to check blood sugar twice a day.  ICD-9 250.00, on insulin.   100 each   6   . hydrALAZINE (APRESOLINE) 50 MG tablet   Oral   Take 1 tablet (50 mg total) by mouth 3 (three) times daily.   90 tablet   0   . insulin glargine (LANTUS) 100 UNIT/ML injection   Subcutaneous   Inject 15 Units into the skin at bedtime.         . Lancets (ONETOUCH ULTRASOFT) lancets      Use as instructed to check blood sugar two times a day.   100 each   6   . lisinopril (PRINIVIL,ZESTRIL) 40 MG tablet      TAKE 1 TABLET (40 MG TOTAL) BY MOUTH DAILY.   90 tablet   2   . metFORMIN (GLUCOPHAGE) 1000 MG tablet      TAKE 1 TABLET (1,000 MG TOTAL) BY MOUTH 2 (TWO) TIMES DAILY WITH A MEAL.   180 tablet   2   . Multiple Vitamin (MULITIVITAMIN WITH MINERALS) TABS   Oral   Take 1 tablet by mouth every morning.          . pravastatin (PRAVACHOL) 40 MG tablet      TAKE 1 TABLET (40 MG TOTAL) BY MOUTH DAILY.   90 tablet   2     BP 116/71  Pulse 76  Temp(Src) 99.4 F (37.4 C) (Oral)  Resp 20  Ht 6' (1.829 m)  Wt 212 lb (96.163 kg)  BMI 28.75 kg/m2  SpO2 98%  Physical Exam  Nursing note and vitals reviewed. Constitutional: He is oriented to person, place, and time. He appears well-developed. No distress.  HENT:  Head: Normocephalic and atraumatic.  Right Ear: External ear normal.  Left Ear: External ear normal.  Mouth/Throat: Oropharynx is clear and moist.  Neck: Normal range of motion. Neck supple.  Cardiovascular: Normal rate and regular rhythm.   No murmur heard. Pulmonary/Chest: Effort normal and  breath sounds normal. No respiratory distress. He has no wheezes.  Abdominal: Soft. Bowel sounds are  normal. He exhibits no distension. There is no tenderness.  Musculoskeletal: Normal range of motion. He exhibits no edema.  Lymphadenopathy:    He has no cervical adenopathy.  Neurological: He is alert and oriented to person, place, and time.  Skin: Skin is warm and dry. He is not diaphoretic.    ED Course  Procedures (including critical care time)  Labs Reviewed - No data to display Dg Chest 2 View  11/24/2012  *RADIOLOGY REPORT*  Clinical Data: Cough.  Chills.  Chest congestion.  CHEST - 2 VIEW  Comparison: 07/28/2012  Findings: Heart size remains stable and within normal limits.  Mild tortuosity of the thoracic aorta stable.  Both lungs are clear.  No evidence of pleural effusion.  No mass or lymphadenopathy identified.  IMPRESSION: Stable exam.  No active disease.   Original Report Authenticated By: Earle Gell, M.D.      1. Acute sinusitis       MDM  The patient presents with sinus congestion for the past several days.  This seems infectious in nature.  I doubt any emergent cardiac pathology as he has no chest pain, shortness of breath.  I will treat with a zpack, return prn.        Lorri Frederick, MD 11/24/12 [phone]

## 2012-11-24 NOTE — ED Notes (Signed)
Pt states he has so much sinus drainage that he has not been able to use his c-pap machine at night, occasionally is sob

## 2013-01-22 ENCOUNTER — Encounter: Payer: Self-pay | Admitting: Dietician

## 2013-02-05 ENCOUNTER — Other Ambulatory Visit: Payer: Self-pay | Admitting: Internal Medicine

## 2013-02-06 ENCOUNTER — Encounter: Payer: Self-pay | Admitting: Internal Medicine

## 2013-02-06 ENCOUNTER — Ambulatory Visit (INDEPENDENT_AMBULATORY_CARE_PROVIDER_SITE_OTHER): Payer: Medicare Other | Admitting: Internal Medicine

## 2013-02-06 VITALS — BP 133/71 | HR 60 | Temp 97.8°F | Ht 72.0 in | Wt 220.3 lb

## 2013-02-06 DIAGNOSIS — E119 Type 2 diabetes mellitus without complications: Secondary | ICD-10-CM

## 2013-02-06 DIAGNOSIS — I10 Essential (primary) hypertension: Secondary | ICD-10-CM

## 2013-02-06 DIAGNOSIS — I509 Heart failure, unspecified: Secondary | ICD-10-CM

## 2013-02-06 DIAGNOSIS — E785 Hyperlipidemia, unspecified: Secondary | ICD-10-CM

## 2013-02-06 DIAGNOSIS — G4733 Obstructive sleep apnea (adult) (pediatric): Secondary | ICD-10-CM

## 2013-02-06 LAB — POCT GLYCOSYLATED HEMOGLOBIN (HGB A1C): Hemoglobin A1C: 6

## 2013-02-06 LAB — GLUCOSE, CAPILLARY: Glucose-Capillary: 126 mg/dL — ABNORMAL HIGH (ref 70–99)

## 2013-02-06 NOTE — Assessment & Plan Note (Addendum)
Assessment: Patient reports that he is compliant with his CPAP.  He denies any daytime somnolence or morning headaches.  Plan: Continue nocturnal CPAP.

## 2013-02-06 NOTE — Assessment & Plan Note (Signed)
BP Readings from Last 3 Encounters:  02/06/13 133/71  11/24/12 116/71  10/04/12 150/80    Lab Results  Component Value Date   NA 140 09/19/2012   K 3.6 09/19/2012   CREATININE 0.95 09/19/2012    Assessment: Blood pressure control: controlled Progress toward BP goal:  at goal Comments: Blood pressure is well controlled on amlodipine 10 mg daily, carvedilol 25 mg twice a day, clonidine 0.3 mg twice a day, furosemide 60 mg daily, hydralazine 50 mg 3 times a day, and lisinopril 40 mg daily  Plan: Medications:  continue current medications

## 2013-02-06 NOTE — Assessment & Plan Note (Signed)
Lab Results  Component Value Date   HGBA1C 6.0 02/06/2013   HGBA1C 6.4 09/19/2012   HGBA1C 8.2* 07/04/2012     Assessment: Diabetes control: good control (HgbA1C at goal) Progress toward A1C goal:  at goal Comments: Patient is doing very well on Lantus insulin 15 units at bedtime and metformin 1000 mg twice a day.  Plan: Medications:  continue current medications Home glucose monitoring: Frequency: 2 times a day Timing: before breakfast;before dinner Instruction/counseling given: reminded to bring blood glucose meter & log to each visit and reminded to bring medications to each visit

## 2013-02-06 NOTE — Patient Instructions (Signed)
General Instructions: Continue current medications.   Treatment Goals:  Goals (1 Years of Data) as of 02/06/13         As of Today 11/24/12 10/04/12 09/19/12 07/30/12     Blood Pressure    . Blood Pressure < 140/80  133/71 116/71 150/80 142/76 190/96     Result Component    . HEMOGLOBIN A1C < 7.0  6.0   6.4     . LDL CALC < 70     81       Progress Toward Treatment Goals:  Treatment Goal 02/06/2013  Hemoglobin A1C at goal  Blood pressure at goal    Self Care Goals & Plans:  Self Care Goal 09/19/2012  Manage my medications take my medicines as prescribed; bring my medications to every visit  Monitor my health keep track of my blood glucose; bring my glucose meter and log to each visit  Eat healthy foods eat fruit for snacks and desserts  Be physically active -    Home Blood Glucose Monitoring 02/06/2013  Check my blood sugar 2 times a day  When to check my blood sugar before breakfast; before dinner     Care Management & Community Referrals:  Referral 02/06/2013  Referrals made for care management support none needed  Referrals made to community resources none

## 2013-02-06 NOTE — Assessment & Plan Note (Addendum)
Lipids:    Component Value Date/Time   CHOL 141 09/19/2012 1238   TRIG 118 09/19/2012 1238   HDL 36* 09/19/2012 1238   LDLCALC 81 09/19/2012 1238   VLDL 24 09/19/2012 1238   CHOLHDL 3.9 09/19/2012 1238    Assessment: LDL and triglycerides are at goal on fenofibrate (TriCor) 48 mg daily.  Patient reports no apparent side effects.   Plan: Continue fenofibrate 48 mg daily.

## 2013-02-06 NOTE — Progress Notes (Signed)
  Subjective:    Patient ID: Charles Hart., male    DOB: 1956-03-11, 57 y.o.   MRN: [id number]  HPI  Patient returns for follow up of his diabetes mellitus, hypertension, hyperlipidemia, and other chronic medical problems.  Today he has no complaints, and reports that he has been doing well.  He did not bring his medications, but confirmed his medications from memory.  He reports that he is compliant with his medications.  Patient denies any symptoms of active congestive heart failure, and specifically denies dyspnea, orthopnea, PND, chest pain, or lower extremity edema.   Review of Systems  Constitutional: Negative for fever, chills and diaphoresis.  Eyes: Negative for visual disturbance.  Respiratory: Negative for cough, shortness of breath and wheezing.   Cardiovascular: Negative for chest pain.  Gastrointestinal: Negative for nausea, vomiting, abdominal pain, blood in stool and anal bleeding.  Genitourinary: Negative for dysuria and frequency.  Neurological: Negative for dizziness and syncope.   I reviewed and reconciled the medication list.  I reviewed the past medical history, past surgical history, family history, and social history.      Objective:   Physical Exam  Constitutional: No distress.  Cardiovascular: Normal rate, regular rhythm and normal heart sounds.  Exam reveals no gallop and no friction rub.   No murmur heard. No leg edema  Pulmonary/Chest: Effort normal and breath sounds normal. No respiratory distress. He has no wheezes. He has no rales.  Abdominal: Soft. Bowel sounds are normal. He exhibits no distension. There is no hepatosplenomegaly. There is no tenderness. There is no rebound and no guarding.        Assessment & Plan:

## 2013-02-06 NOTE — Assessment & Plan Note (Signed)
Assessment: Patient is doing well with no symptoms of decompensated heart failure.  Plan: Continue current medication regimen.

## 2013-02-18 ENCOUNTER — Telehealth: Payer: Self-pay | Admitting: Internal Medicine

## 2013-02-18 DIAGNOSIS — N049 Nephrotic syndrome with unspecified morphologic changes: Secondary | ICD-10-CM

## 2013-02-18 DIAGNOSIS — N08 Glomerular disorders in diseases classified elsewhere: Secondary | ICD-10-CM | POA: Insufficient documentation

## 2013-02-18 DIAGNOSIS — R809 Proteinuria, unspecified: Secondary | ICD-10-CM

## 2013-02-18 HISTORY — DX: Nephrotic syndrome with unspecified morphologic changes: N04.9

## 2013-02-18 NOTE — Telephone Encounter (Signed)
Microalb, Ur  Date Value Range Status  02/06/2013 577.55* 0.00 - 1.89 mg/dL Final     Result confirmed by automatic dilution.     Result repeated and verified.  05/19/2010 18.47* (0.00-1.89 mg/dL Final  03/26/2009 27.93* (0.00-1.89 mg/dL Final     Creatinine, Urine  Date Value Range Status  02/06/2013 246.1   Final  05/19/2010 141.2   Final  03/26/2009 114.4   Final     Microalb Creat Ratio  Date Value Range Status  02/06/2013 2346.8* 0.0 - 30.0 mg/g Final  05/19/2010 130.8* (0.0-30.0) mg/g Final  03/26/2009 244.1* (0.0-30.0) mg/g Final     Assessment: Urine for microalbumin on 02/06/2013 showed marked elevation compared to 05/2010.  His proteinuria is likely due to his diabetes mellitus.  Patient is on an ACE inhibitor, and his diabetes is reasonably well-controlled.    Plan: The plan is to have patient collect a 24-hour urine for protein and creatinine, and depending upon that result consider referral to nephrology for evaluation.  I called the patient today and discussed the findings and the plan with him, and he agreed to come in and pick up a container and instructions for a 24 urine collection.

## 2013-02-18 NOTE — Assessment & Plan Note (Signed)
Microalb, Ur  Date Value Range Status  02/06/2013 577.55* 0.00 - 1.89 mg/dL Final     Result confirmed by automatic dilution.     Result repeated and verified.  05/19/2010 18.47* (0.00-1.89 mg/dL Final  03/26/2009 27.93* (0.00-1.89 mg/dL Final     Creatinine, Urine  Date Value Range Status  02/06/2013 246.1   Final  05/19/2010 141.2   Final  03/26/2009 114.4   Final     Microalb Creat Ratio  Date Value Range Status  02/06/2013 2346.8* 0.0 - 30.0 mg/g Final  05/19/2010 130.8* (0.0-30.0) mg/g Final  03/26/2009 244.1* (0.0-30.0) mg/g Final     Assessment: Urine for microalbumin on 02/06/2013 showed marked elevation compared to 05/2010.  His proteinuria is likely due to his diabetes mellitus.  Patient is on an ACE inhibitor, and his diabetes is reasonably well-controlled.    Plan: The plan is to have patient collect a 24-hour urine for protein and creatinine, and depending upon that result consider referral to nephrology for evaluation.  I called the patient today and discussed the findings and the plan with him, and he agreed to come in and pick up a container and instructions for a 24 urine collection.

## 2013-02-23 ENCOUNTER — Other Ambulatory Visit: Payer: Self-pay | Admitting: Internal Medicine

## 2013-02-27 ENCOUNTER — Telehealth: Payer: Self-pay | Admitting: *Deleted

## 2013-02-27 ENCOUNTER — Other Ambulatory Visit: Payer: Self-pay | Admitting: Internal Medicine

## 2013-02-27 NOTE — Telephone Encounter (Signed)
Pt called with c/o swelling to feet and legs. Onset several weeks. Increase since last visit in clinic. 6/25.  Denies SOB.   I talked with Dr Marinda Elk and he wants pt seen in clinic this week. Pt scheduled tomorrow.

## 2013-02-28 ENCOUNTER — Encounter: Payer: Self-pay | Admitting: Internal Medicine

## 2013-02-28 ENCOUNTER — Ambulatory Visit (INDEPENDENT_AMBULATORY_CARE_PROVIDER_SITE_OTHER): Payer: Medicare Other | Admitting: Internal Medicine

## 2013-02-28 VITALS — BP 156/78 | HR 60 | Temp 98.0°F | Ht 72.0 in | Wt 228.2 lb

## 2013-02-28 DIAGNOSIS — I10 Essential (primary) hypertension: Secondary | ICD-10-CM

## 2013-02-28 DIAGNOSIS — I509 Heart failure, unspecified: Secondary | ICD-10-CM

## 2013-02-28 DIAGNOSIS — R609 Edema, unspecified: Secondary | ICD-10-CM

## 2013-02-28 DIAGNOSIS — R809 Proteinuria, unspecified: Secondary | ICD-10-CM

## 2013-02-28 DIAGNOSIS — R600 Localized edema: Secondary | ICD-10-CM | POA: Insufficient documentation

## 2013-02-28 DIAGNOSIS — E119 Type 2 diabetes mellitus without complications: Secondary | ICD-10-CM

## 2013-02-28 MED ORDER — FUROSEMIDE 40 MG PO TABS: ORAL_TABLET | ORAL | Status: DC

## 2013-02-28 NOTE — Assessment & Plan Note (Signed)
BP Readings from Last 3 Encounters:  02/28/13 156/78  02/06/13 133/71  11/24/12 116/71   Lab Results  Component Value Date   NA 140 09/19/2012   K 3.6 09/19/2012   CREATININE 0.95 09/19/2012   Assessment: Blood pressure control: mildly elevated Progress toward BP goal:  deteriorated Comments: reports adherence to medications  Plan: Medications:  discontinue amlodipine, continue lasix but increase to 60mg  in am and 40mg  in pm until monday, continue coreg, clonidine, hydralazine, lisinopril, and digox Educational resources provided: brochure Self management tools provided: home blood pressure logbook

## 2013-02-28 NOTE — Patient Instructions (Addendum)
General Instructions:  Please stop your amlodipine   Please take 60 mg in the morning (1.5 tablet) and take 40mg  (1 tablet) in the evening starting today until Monday  Weigh yourself daily and record your weights and check your blood pressure as well  Call us on Monday and report your weight and pressure and let us know if your swelling has improved  In the mean time if your swelling gets worse, you notice pain, chest pain, shortness of breath, headache, dizziness, sweating, feeling like you will faint, fever, or chills notify us right away and go to the ED  Start your urine collection on Sunday and bring on Monday, we will also check your labs at that time  Treatment Goals:  Goals (1 Years of Data) as of 02/28/13         As of Today As of Today 02/06/13 11/24/12 10/04/12     Blood Pressure    . Blood Pressure < 140/90  156/78 148/77 133/71 116/71 150/80     Result Component    . HEMOGLOBIN A1C < 7.0    6.0      . LDL CALC < 70           Progress Toward Treatment Goals:  Treatment Goal 02/28/2013  Hemoglobin A1C at goal  Blood pressure deteriorated    Self Care Goals & Plans:  Self Care Goal 02/28/2013  Manage my medications take my medicines as prescribed; bring my medications to every visit  Monitor my health keep track of my blood glucose; check my feet daily  Eat healthy foods -  Be physically active -    Home Blood Glucose Monitoring 02/28/2013  Check my blood sugar 3 times a day  When to check my blood sugar before meals   Care Management & Community Referrals:  Referral 02/06/2013  Referrals made for care management support none needed  Referrals made to community resources none    Edema Edema is a buildup of fluids. It is most common in the feet, ankles, and legs. This happens more as a person ages. It may affect one or both legs. HOME CARE   Raise (elevate) the legs or ankles above the level of the heart while lying down.  Avoid sitting or standing still  for a long time.  Exercise the legs to help the puffiness (swelling) go down.  A low-salt diet may help lessen the puffiness.  Only take medicine as told by your doctor. GET HELP RIGHT AWAY IF:   You develop shortness of breath or chest pain.  You cannot breathe when you lie down.  You have more puffiness that does not go away with treatment.  You develop pain or redness in the areas that are puffy.  You have a temperature by mouth above 102 F (38.9 C), not controlled by medicine.  You gain 3 lb/1.4 kg or more in 1 day or 5 lb/2.3 kg in a week. MAKE SURE YOU:   Understand these instructions.  Will watch your condition.  Will get help right away if you are not doing well or get worse. Document Released: 01/18/2008 Document Revised: 10/24/2011 Document Reviewed: 01/18/2008 [facility] Patient Information 2014 [place], [place].

## 2013-02-28 NOTE — Assessment & Plan Note (Addendum)
Appears to be doing well except for the recent worsening of lower extremity edema.  Denies any other symptoms including headaches, chest pain, shortness of breath, worsening DOE and sleeps with 2 pillows like always, complaint with cpap.  No crackles appreciated on physical exam. 8 pounds increase in weight since 02/06/13.  Weight today 228lb.  Appears baseline dry weight ~220lbs.    -repeat echo -increase diuresis for HTN and lower extremity edema, please see lower extremity edema assessment and plan -continue current medication regimen including coreg, clonidine, digoxin, hydralazine, lasix, and lisinopril.  Discontinued amlodipine.

## 2013-02-28 NOTE — Assessment & Plan Note (Signed)
Lab Results  Component Value Date   HGBA1C 6.0 02/06/2013   HGBA1C 6.4 09/19/2012   HGBA1C 8.2* 07/04/2012    Assessment: Diabetes control: good control (HgbA1C at goal) Progress toward A1C goal:  at goal  Plan: Medications:  continue current medications lantus 15 units qhs and metformin 1000mg   Home glucose monitoring: Frequency: 3 times a day Timing: before meals Educational resources provided: brochure

## 2013-02-28 NOTE — Progress Notes (Signed)
Subjective:   Patient ID: Charles Hart Sr. male   DOB: 1956-05-29 57 y.o.   MRN: [name]  HPI: [name]. is a 57 y.o. pleasant African American male with PMH of CHF (last echo 06/2012: EF 55-60%), DM2, HTN, and prior CVA on plavix presenting to clinic today for an acute visit complaining of b/l lower extremity swelling.  He says the swelling has been present x3 weeks at least and worsening.  He has b/l pitting edema mid way up to his calf and he cannot remember the last time he had swelling.  He denies high salt intake, has tried elevating his legs with no improvement, and is on 60mg  lasix daily.  He denies any sob, worsening DOE, cough, abdominal pain, swelling anywhere else, headaches, syncope, light-headedness.  He has no pain in his legs and is fairly active.  He is complaint with his diabetic and HTN medications including amlodipine which could be contributing to his lower extremity edema.  Of note, he is 8 pounds above     Past Medical History  Diagnosis Date  . Dermatitis   . CHF (congestive heart failure)     LV function improved from 2004 to 2008.  Historically, moderately dilated LV with EF 30-40% by 2D echo 08/14/2002.  Mild CAD with severe LV dysfunction by cardiac cath 09/2002.  Normal coronary arteries and normal LV function by cardiac cath 09/19/2006.  A 2-D echo on 04/01/2009 showed mild concentric hypertrophy and normal systolic (LVEF  123456) and doppler C/W with grade 1 diastolic dysfunction.  . Hypertension   . Diabetes mellitus   . Hyperlipidemia   . Hearing loss in right ear   . Cardiomyopathy     LV function improved from 2004 to 2008.  Historically, moderately dilated LV with EF 30-40% by 2D echo 08/14/2002.  Mild CAD with severe LV dysfunction by cardiac cath 09/2002.  Normal coronary arteries and normal LV function by cardiac cath 09/19/2006.  A 2-D echo on 04/01/2009 showed mild concentric hypertrophy and normal systolic (LVEF  123456) and doppler C/W  with grade 1 diastolic dysfunction.  . DM neuropathy, painful   . CVA (cerebral vascular accident) 07/04/2012    MRI of the brain 07/04/2012 showed an acute infarct in the right basal ganglia involving the anterior putamen, anterior limb internal capsule, and head of the caudate; this measured approximately 2.5 cm in diameter.     . Hypertensive crisis 07/28/2012   Current Outpatient Prescriptions  Medication Sig Dispense Refill  . amLODipine (NORVASC) 10 MG tablet TAKE 1 TABLET (10 MG TOTAL) BY MOUTH DAILY.  90 tablet  2  . carvedilol (COREG) 25 MG tablet TAKE 1 TABLET (25 MG TOTAL) BY MOUTH 2 (TWO) TIMES DAILY WITH A MEAL.  180 tablet  2  . cloNIDine (CATAPRES) 0.1 MG tablet Take 0.3 mg by mouth 2 (two) times daily.      . clopidogrel (PLAVIX) 75 MG tablet Take 1 tablet (75 mg total) by mouth daily with breakfast.  30 tablet  11  . DIGOX 0.25 MG tablet TAKE 1 TABLET (250 MCG TOTAL) BY MOUTH DAILY.  90 tablet  2  . fenofibrate (TRICOR) 48 MG tablet Take 1 tablet (48 mg total) by mouth daily.  30 tablet  5  . furosemide (LASIX) 40 MG tablet TAKE 1 AND 1/2 TABLETS BY MOUTH DAILY.  135 tablet  1  . gabapentin (NEURONTIN) 300 MG capsule TAKE 1 CAPSULE (300 MG TOTAL) BY MOUTH DAILY.  90 capsule  2  . glucose blood (ONE TOUCH ULTRA TEST) test strip Use as instructed to check blood sugar twice a day.  ICD-9 250.00, on insulin.  100 each  6  . hydrALAZINE (APRESOLINE) 50 MG tablet Take 1 tablet (50 mg total) by mouth 3 (three) times daily.  90 tablet  0  . insulin glargine (LANTUS) 100 UNIT/ML injection Inject 15 Units into the skin at bedtime.      . Lancets (ONETOUCH ULTRASOFT) lancets Use as instructed to check blood sugar two times a day.  100 each  6  . lisinopril (PRINIVIL,ZESTRIL) 40 MG tablet TAKE 1 TABLET (40 MG TOTAL) BY MOUTH DAILY.  90 tablet  2  . metFORMIN (GLUCOPHAGE) 1000 MG tablet TAKE 1 TABLET (1,000 MG TOTAL) BY MOUTH 2 (TWO) TIMES DAILY WITH A MEAL.  180 tablet  2  . Multiple  Vitamin (MULITIVITAMIN WITH MINERALS) TABS Take 1 tablet by mouth every morning.       . niacin 500 MG CR capsule Take 500 mg by mouth at bedtime.      . pravastatin (PRAVACHOL) 40 MG tablet TAKE 1 TABLET (40 MG TOTAL) BY MOUTH DAILY.  90 tablet  2   No current facility-administered medications for this visit.   Family History  Problem Relation Age of Onset  . Colon cancer Sister   . Aneurysm Father 33    died of rupture   History   Social History  . Marital Status: Married    Spouse Name: N/A    Number of Children: N/A  . Years of Education: N/A   Social History Main Topics  . Smoking status: Never Smoker   . Smokeless tobacco: Never Used  . Alcohol Use: Yes     Comment: Wine occasionally.  . Drug Use: No  . Sexually Active: Not on file   Other Topics Concern  . Not on file   Social History Narrative  . No narrative on file    Review of Systems:  Constitutional:  Denies fever, chills, diaphoresis, appetite change and fatigue.   HEENT:  Denies congestion, sore throat, rhinorrhea, sneezing, mouth sores, trouble swallowing, neck pain   Respiratory:  Denies SOB, DOE, cough, and wheezing.   Cardiovascular:  B/l leg swelling.  Denies chest pain, palpitations.  Gastrointestinal:  Denies nausea, vomiting, abdominal pain, diarrhea, constipation, blood in stool and abdominal distention.   Genitourinary:  Denies dysuria, urgency, frequency, hematuria, flank pain and difficulty urinating.   Musculoskeletal:  B/l leg swelling. Denies myalgias, back pain, arthralgias and gait problem.   Skin:  Denies pallor, rash and wound.   Neurological:  Denies dizziness, syncope, weakness, light-headedness, numbness and headaches.    Objective:  Physical Exam: Filed Vitals:   02/28/13 1334  BP: 148/77  Pulse: 58  Temp: 98 F (36.7 C)  TempSrc: Oral  Height: 6' (1.829 m)  Weight: 228 lb 3.2 oz (103.511 kg)  SpO2: 98%   Vitals reviewed. General: sitting in chair, NAD HEENT: PERRL,  EOMI, no scleral icterus Cardiac: RRR, no rubs, murmurs or gallops Pulm: clear to auscultation bilaterally, no wheezes, rales, or rhonchi Abd: soft, nontender, nondistended, BS present Ext: warm and well perfused, b/l pitting edema +2 up to mid calf, non tender to palpation, +2DP B/L Neuro: alert and oriented X3, cranial nerves II-XII grossly intact, strength and sensation to light touch equal in bilateral upper and lower extremities  Assessment & Plan:  Discussed with Dr. Marinda Elk Lower extremity edema: repeat echo, increase  lasix to 60mg  am and 40mg  in pm until Monday and re-evaluate, asked to call with weights and f/u labs next week. 24 hour urine collection he still start on Sunday. D/c amlodipine.

## 2013-02-28 NOTE — Assessment & Plan Note (Signed)
Re-iterated 24 hour urine collection as per pcp plan.  He states he will start on Sunday.  He has already picked up the container.  -f/u urine collection -if edema continues and pending results of urine collection, consider renal referral if needed

## 2013-02-28 NOTE — Assessment & Plan Note (Addendum)
B/l worsening +2 pitting edema mid calf x3 weeks.  Hx of CHF and HTN.  Does not recall last time his legs were swollen.  On lasix 60mg  qd.  Weight today 8 pounds up from last opc visit on 02/06/13.  ?secondary to CHF vs. Side effect of amlodipine.  Could also be indicative of possible nephrotic syndrome given markedly elevated protein uria 2346.8 microalb/cr ratio on last visit.  Given cardiac history of poor EF that has improved over the years, I think it is reasonable to do another echo at this given the worsening lower extremity edema despite diuresis.  No tenderness to palpation and symmetrical edema makes me less suspicious of possible DVTs.  It is certainly possible that amlodipine is likely contributing to this presentation, however 8 pounds weight gain appears significant.  Denies SOB, chest pain, headaches, abdominal pain, fever, or chills.   -decrease salt intake -elevate legs  -repeat echo -repeat cmet -24 hour urine collection as recommended by pcp (pt claims he will start on Sunday) -increase lasix to 60mg  in AM and 40mg  in PM until Monday -discontinued amlodipine -instructed to weigh himself daily and record weights along with checking BP if possible and call on Monday with weights and update on swelling.  Will also need repeat bmet given increase in diuresis

## 2013-03-01 ENCOUNTER — Telehealth: Payer: Self-pay | Admitting: *Deleted

## 2013-03-01 NOTE — Telephone Encounter (Signed)
Message copied by Ebbie Latus on Fri Mar 01, 2013  9:41 AM ------      Message from: Wilber Oliphant      Created: Thu Feb 28, 2013  6:02 PM      Regarding: he does not appear to have gotten his cmet done       Please touch base with Charles Hart.  He was to have a cmet done today.  He will need to return to have it done, hopefully tomorrow.   ------

## 2013-03-01 NOTE — Telephone Encounter (Signed)
Talked to pt - stated he will come back Monday 7/21 around 1000AM for lab (CMET) since he lives 25 miles away.

## 2013-03-04 ENCOUNTER — Other Ambulatory Visit (INDEPENDENT_AMBULATORY_CARE_PROVIDER_SITE_OTHER): Payer: Medicare Other

## 2013-03-04 DIAGNOSIS — I509 Heart failure, unspecified: Secondary | ICD-10-CM

## 2013-03-04 DIAGNOSIS — R809 Proteinuria, unspecified: Secondary | ICD-10-CM

## 2013-03-04 LAB — COMPREHENSIVE METABOLIC PANEL
AST: 17 U/L (ref 0–37)
Albumin: 3.8 g/dL (ref 3.5–5.2)
BUN: 21 mg/dL (ref 6–23)
CO2: 29 mEq/L (ref 19–32)
Calcium: 8.3 mg/dL — ABNORMAL LOW (ref 8.4–10.5)
Chloride: 98 mEq/L (ref 96–112)
Glucose, Bld: 218 mg/dL — ABNORMAL HIGH (ref 70–99)
Potassium: 4 mEq/L (ref 3.5–5.3)

## 2013-03-04 NOTE — Progress Notes (Signed)
Case discussed with Dr. Qureshi at the time of the visit.  We reviewed the resident's history and exam and pertinent patient test results.  I agree with the assessment, diagnosis, and plan of care documented in the resident's note. 

## 2013-03-05 LAB — CREATININE CLEARANCE, URINE, 24 HOUR: Creatinine: 1.35 mg/dL (ref 0.50–1.35)

## 2013-03-05 LAB — PROTEIN, URINE, 24 HOUR: Protein, 24H Urine: 5460 mg/d — ABNORMAL HIGH (ref 50–100)

## 2013-03-08 ENCOUNTER — Telehealth: Payer: Self-pay | Admitting: Internal Medicine

## 2013-03-08 DIAGNOSIS — N049 Nephrotic syndrome with unspecified morphologic changes: Secondary | ICD-10-CM

## 2013-03-08 DIAGNOSIS — E119 Type 2 diabetes mellitus without complications: Secondary | ICD-10-CM

## 2013-03-08 NOTE — Telephone Encounter (Signed)
Telephone Contact:  Protein, 24H Urine  Date Value Range Status  03/04/2013 5460* 50 - 100 mg/day Final     Creatinine Clearance  Date Value Range Status  03/04/2013 80  75 - 125 mL/min Final     To convert to Grams, divide mg by 1000     Assessment: A 24-hour urine collection shows nephrotic range proteinuria with 5460 mg per day; patient's creatinine clearance was 80 mL/minute.  His nephrotic syndrome might be due to diabetes, although it seems disproportionate given his diabetes control.  Plan: The plan is to refer to nephrology for evaluation.  I called patient today and discussed this finding with him, and the plans for nephrology referral, he is in agreement with the plan.  He reports improvement in his leg edema since his recent clinic visit, and he has an appointment with his cardiologist in the near future.

## 2013-03-08 NOTE — Assessment & Plan Note (Signed)
Telephone Contact:  Protein, 24H Urine  Date Value Range Status  03/04/2013 5460* 50 - 100 mg/day Final     Creatinine Clearance  Date Value Range Status  03/04/2013 80  75 - 125 mL/min Final     To convert to Grams, divide mg by 1000     Assessment: A 24-hour urine collection shows nephrotic range proteinuria with 5460 mg per day; patient's creatinine clearance was 80 mL/minute.  His nephrotic syndrome might be due to diabetes, although it seems disproportionate given his diabetes control.  Plan: The plan is to refer to nephrology for evaluation.  I called patient today and discussed this finding with him, and the plans for nephrology referral, he is in agreement with the plan.  He reports improvement in his leg edema since his recent clinic visit, and he has an appointment with his cardiologist in the near future.

## 2013-03-13 ENCOUNTER — Encounter: Payer: Self-pay | Admitting: Internal Medicine

## 2013-03-13 ENCOUNTER — Ambulatory Visit (INDEPENDENT_AMBULATORY_CARE_PROVIDER_SITE_OTHER): Payer: Medicare Other | Admitting: Internal Medicine

## 2013-03-13 VITALS — BP 140/80 | HR 85 | Temp 97.8°F | Ht 72.0 in | Wt 222.8 lb

## 2013-03-13 DIAGNOSIS — I428 Other cardiomyopathies: Secondary | ICD-10-CM

## 2013-03-13 DIAGNOSIS — E119 Type 2 diabetes mellitus without complications: Secondary | ICD-10-CM

## 2013-03-13 DIAGNOSIS — I509 Heart failure, unspecified: Secondary | ICD-10-CM

## 2013-03-13 DIAGNOSIS — N049 Nephrotic syndrome with unspecified morphologic changes: Secondary | ICD-10-CM

## 2013-03-13 DIAGNOSIS — I10 Essential (primary) hypertension: Secondary | ICD-10-CM

## 2013-03-13 DIAGNOSIS — I639 Cerebral infarction, unspecified: Secondary | ICD-10-CM

## 2013-03-13 MED ORDER — FUROSEMIDE 40 MG PO TABS: 80.0000 mg | ORAL_TABLET | Freq: Every morning | ORAL | Status: DC

## 2013-03-13 NOTE — Assessment & Plan Note (Addendum)
BP Readings from Last 3 Encounters:  03/13/13 140/80  02/28/13 156/78  02/06/13 133/71    Lab Results  Component Value Date   NA 138 03/04/2013   K 4.0 03/04/2013   CREATININE 1.35 03/04/2013    Assessment: Blood pressure control:  Patient's initial blood pressure today was moderately elevated, but had improved on repeat.  Amlodipine was discontinued on 02/28/13 because of lower extremity edema   Plan: Medications:  Increase furosemide to a dose of 80 mg daily; continue other medications as before (carvedilol 25 mg twice a day, clonidine 0.3 mg twice a day, hydralazine 50 mg 3 times a day, and lisinopril 40 mg daily).

## 2013-03-13 NOTE — Assessment & Plan Note (Addendum)
Assessment: Patient's leg edema has improved following the discontinuation of amlodipine on 02/28/13 and following an increase in his furosemide dose for a few days thereafter.  Patient now has mild bilateral leg edema on furosemide 60 mg each morning.  Patient's heart failure is otherwise well compensated, with no dyspnea, orthopnea, or PND.   Plan: Increase furosemide to a dose of 80 mg each morning.  Patient has an appointment with his cardiologist Dr. [name] next week.

## 2013-03-13 NOTE — Assessment & Plan Note (Signed)
Assessment: Patient's leg edema has improved following the discontinuation of amlodipine on 02/28/13 and following an increase in his furosemide dose for a few days thereafter.  Patient now has mild bilateral leg edema on furosemide 60 mg each morning.  Patient's heart failure is otherwise well compensated, with no dyspnea, orthopnea, or PND.   Plan: Increase furosemide to a dose of 80 mg each morning.  Patient has an appointment with his cardiologist Dr. [name] next week.

## 2013-03-13 NOTE — Assessment & Plan Note (Signed)
Telephone Contact:  Protein, 24H Urine  Date Value Range Status  03/04/2013 5460* 50 - 100 mg/day Final     Creatinine Clearance  Date Value Range Status  03/04/2013 80  75 - 125 mL/min Final     To convert to Grams, divide mg by 1000     Assessment: As previously noted, a 24-hour urine collection showed nephrotic range proteinuria with 5460 mg per day; patient's creatinine clearance was 80 mL/minute.  His nephrotic syndrome might be due to diabetes, although it seems disproportionate given his diabetes control.  Plan: Refer to nephrology for evaluation.

## 2013-03-13 NOTE — Patient Instructions (Addendum)
Increase furosemide (Lasix) 40 mg to a dose of 2 tablets (80 mg total) each morning. A referral has been made to a kidney specialist (nephrologist) for evaluation of elevated protein in your urine.

## 2013-03-13 NOTE — Assessment & Plan Note (Addendum)
Lab Results  Component Value Date   HGBA1C 6.0 02/06/2013   HGBA1C 6.4 09/19/2012   HGBA1C 8.2* 07/04/2012     Assessment: Diabetes control: good control (HgbA1C at goal) Progress toward A1C goal:  at goal Comments: Patient is doing well on Lantus insulin 15 units at bedtime and metformin 1000 mg twice a day.  He has had no documented episodes of hypoglycemia.  Plan: Medications:  continue current medications Home glucose monitoring: Frequency: 3 times a day Timing: before meals Instruction/counseling given: reminded to bring blood glucose meter & log to each visit and reminded to bring medications to each visit

## 2013-03-13 NOTE — Progress Notes (Signed)
  Subjective:    Patient ID: Charles Hart., male    DOB: 02-13-1956, 57 y.o.   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  He was seen in our clinic on 02/28/2013 with complaint of increased leg edema; at that time, his amlodipine was stopped and his furosemide dose was increased from 60 mg each morning to a dose of 60 mg each morning and 40 mg each evening for 4 days.  He then went back to his usual furosemide dose of 60 mg each morning.  He reports improvement in the leg edema, although he still has mild bilateral leg edema.  He denies dyspnea, orthopnea, PND, chest pain, or other problems.    Regarding his other medical issues, patient reports that he is compliant with his medications.  He was hospitalized in November of 2013 with a right basal ganglia infarct but has no apparent neurologic sequelae.  He uses CPAP for his obstructive sleep apnea and has no daytime symptoms.  Regarding alcohol, patient has a history of occasional heavy alcohol use prior to 1986, but was then abstinent completely until November of last year.  He now reports occasionally drinking a glass of wine with dinner, no more than 2 glasses of wine per month; he denies any excessive drinking or drinking to the point of intoxication.  He does not feel that he is at risk for drinking more heavily than this.  He understands that any amount of alcohol would likely impair his driving performance and that he should never drink and drive; patient affirms that he never does drink and drive.  He denies ever having been charged with DWI.  I advised him that the best idea would be to abstain completely from alcohol.     Review of Systems  Respiratory: Negative for shortness of breath.   Cardiovascular: Positive for leg swelling (Mild, improved). Negative for chest  pain.  Gastrointestinal: Negative for nausea, vomiting and abdominal pain.  Neurological: Negative for dizziness, seizures, syncope, speech difficulty, weakness and numbness.  Psychiatric/Behavioral: Negative for dysphoric mood.       Objective:   Physical Exam  Constitutional: He is oriented to person, place, and time. No distress.  Cardiovascular: Normal rate, regular rhythm and normal heart sounds.  Exam reveals no gallop and no friction rub.   No murmur heard. Pulmonary/Chest: Effort normal and breath sounds normal. No respiratory distress. He has no wheezes. He has no rales.  Abdominal: Soft. Bowel sounds are normal. He exhibits no distension. There is no tenderness. There is no rebound and no guarding.  Musculoskeletal: He exhibits no edema.  Neurological: He is alert and oriented to person, place, and time. He has normal strength. No cranial nerve deficit. He displays a negative Romberg sign. Coordination and gait normal.        Assessment & Plan:

## 2013-03-13 NOTE — Assessment & Plan Note (Signed)
Assessment: Patient was hospitalized in November of 2013 with an acute right basal ganglia infarct; he had no neurologic sequela, and currently has no neurologic symptoms or deficits  Plan: Continue clopidogrel 75 mg daily; continue risk factor management.

## 2013-03-19 ENCOUNTER — Ambulatory Visit (INDEPENDENT_AMBULATORY_CARE_PROVIDER_SITE_OTHER): Payer: 59 | Admitting: Cardiovascular Disease

## 2013-03-19 ENCOUNTER — Encounter: Payer: Self-pay | Admitting: Cardiovascular Disease

## 2013-03-19 VITALS — BP 148/79 | HR 61 | Ht 72.0 in | Wt 221.8 lb

## 2013-03-19 DIAGNOSIS — I5040 Unspecified combined systolic (congestive) and diastolic (congestive) heart failure: Secondary | ICD-10-CM

## 2013-03-19 NOTE — Progress Notes (Signed)
Patient ID: Charles Hart., male   DOB: Dec 29, 1955, 57 y.o.   MRN: [id number] 57 yo with DM,HTN history of abnormal ECG and DCM. Previous EF 30-35% with poorly controlled BP. Improved to normal on echo 2010 with compliance of meds. No dyspnea,sscp,palpitatins,edema or syncope. Primary has stopped his actos and glipizide and started [place]. Compliant with meds and BP has been good. ECG chronically abnormal with LVH changes. Saw Dr Katy Fitch in January and eyes are fine. No other end organ damage from DM. BS out of control in January due to med compliance issues. 2 grand daughters were living with him for a while and there was a lot of stress. Not working since September   Spent 20 minutes filling out [facility] forms  More compliant with meds Last A1c 6   Echo 11/03/10  Study Conclusions Left ventricle: The cavity size was mildly dilated. Wall thickness was normal. Systolic function was normal. The estimated ejection fraction was in the range of 55% to 60%. Wall motion was normal; there were no regional wall motion abnormalities. Transthoracic echocardiography. M-mode, complete 2D, spectral Doppler, and color Doppler. Height: Height: 180.3cm. Height: 71in. Weight: Weight: 115.2kg. Weight: 253.5lb. Body mass index: BMI: 35.4kg/m^2. Body surface area: BSA: 2.60m^2. Blood pressure: 137/67. Patient status: Outpatient. 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  All other systems reviewed and negative  General: Affect appropriate Healthy:  appears stated age 36: normal Neck supple with no adenopathy JVP normal no bruits no thyromegaly Lungs clear with no wheezing and good diaphragmatic motion Heart:  S1/S2 no  murmur, no rub, gallop or click PMI normal Abdomen: benighn, BS positve, no tenderness, no AAA no bruit.  No HSM or HJR Distal pulses intact with no bruits No edema Neuro non-focal Skin warm and dry No muscular weakness   Current Outpatient Prescriptions  Medication Sig Dispense Refill  . carvedilol (COREG) 25 MG tablet TAKE 1 TABLET (25 MG TOTAL) BY MOUTH 2 (TWO) TIMES DAILY WITH A MEAL.  180 tablet  2  . cloNIDine (CATAPRES) 0.1 MG tablet Take 0.3 mg by mouth 2 (two) times daily.      . clopidogrel (PLAVIX) 75 MG tablet Take 1 tablet (75 mg total) by mouth daily.  30 tablet  11  . DIGOX 0.25 MG tablet TAKE 1 TABLET (250 MCG TOTAL) BY MOUTH DAILY.  90 tablet  2  . fenofibrate (TRICOR) 48 MG tablet Take 1 tablet (48 mg total) by mouth daily.  30 tablet  5  . furosemide (LASIX) 40 MG tablet Take 2 tablets (80 mg total) by mouth every morning.  60 tablet  6  . gabapentin (NEURONTIN) 300 MG capsule TAKE 1 CAPSULE (300 MG TOTAL) BY MOUTH DAILY.  90 capsule  2  . glucose blood (ONE TOUCH ULTRA TEST) test strip Use as instructed to check blood sugar twice a day.  ICD-9 250.00, on insulin.  100 each  6  . hydrALAZINE (APRESOLINE) 50 MG tablet Take 1 tablet (50 mg total) by mouth 3 (three) times daily.  90 tablet  0  . insulin glargine (LANTUS) 100 UNIT/ML injection Inject 15 Units into the skin at bedtime.      . Lancets (ONETOUCH ULTRASOFT) lancets Use as instructed to  check blood sugar two times a day.  100 each  6  . lisinopril (PRINIVIL,ZESTRIL) 40 MG tablet TAKE 1 TABLET (40 MG TOTAL) BY MOUTH DAILY.  90 tablet  2  . metFORMIN (GLUCOPHAGE) 1000 MG tablet TAKE 1 TABLET (1,000 MG TOTAL) BY MOUTH 2 (TWO) TIMES DAILY WITH A MEAL.  180 tablet  2  . Multiple Vitamin (MULITIVITAMIN WITH MINERALS) TABS Take 1 tablet by mouth every morning.       . niacin 500 MG CR capsule Take 500 mg by mouth at bedtime.      . pravastatin (PRAVACHOL) 40 MG tablet TAKE 1 TABLET (40 MG TOTAL) BY MOUTH DAILY.  90  tablet  2   No current facility-administered medications for this visit.    Allergies  Review of patient's allergies indicates no known allergies.  Electrocardiogram:  12/14/  SR rate 87 LVH LAE    Assessment and Plan

## 2013-03-19 NOTE — Patient Instructions (Addendum)
Your physician recommends that you schedule a follow-up appointment in: 6 MONTHS WITH PN

## 2013-03-20 ENCOUNTER — Ambulatory Visit (HOSPITAL_COMMUNITY)
Admission: RE | Admit: 2013-03-20 | Discharge: 2013-03-20 | Disposition: A | Discharge status: Home / Self Care | Payer: Medicare Other | Source: Ambulatory Visit | Attending: Internal Medicine | Admitting: Internal Medicine

## 2013-03-20 DIAGNOSIS — Z8673 Personal history of transient ischemic attack (TIA), and cerebral infarction without residual deficits: Secondary | ICD-10-CM | POA: Insufficient documentation

## 2013-03-20 DIAGNOSIS — I509 Heart failure, unspecified: Secondary | ICD-10-CM | POA: Insufficient documentation

## 2013-03-20 DIAGNOSIS — E119 Type 2 diabetes mellitus without complications: Secondary | ICD-10-CM | POA: Insufficient documentation

## 2013-03-20 DIAGNOSIS — I2510 Atherosclerotic heart disease of native coronary artery without angina pectoris: Secondary | ICD-10-CM | POA: Insufficient documentation

## 2013-03-20 DIAGNOSIS — R600 Localized edema: Secondary | ICD-10-CM

## 2013-03-20 DIAGNOSIS — I10 Essential (primary) hypertension: Secondary | ICD-10-CM | POA: Insufficient documentation

## 2013-03-20 DIAGNOSIS — I428 Other cardiomyopathies: Secondary | ICD-10-CM | POA: Insufficient documentation

## 2013-03-20 DIAGNOSIS — I517 Cardiomegaly: Secondary | ICD-10-CM

## 2013-03-20 NOTE — Progress Notes (Signed)
Echo Lab  2D Echocardiogram completed.  [place], RDCS 03/20/2013 12:05 PM

## 2013-04-08 ENCOUNTER — Other Ambulatory Visit (HOSPITAL_COMMUNITY): Payer: Self-pay | Admitting: Nephrology

## 2013-04-08 DIAGNOSIS — E11319 Type 2 diabetes mellitus with unspecified diabetic retinopathy without macular edema: Secondary | ICD-10-CM | POA: Insufficient documentation

## 2013-04-08 DIAGNOSIS — N289 Disorder of kidney and ureter, unspecified: Secondary | ICD-10-CM

## 2013-04-08 DIAGNOSIS — R609 Edema, unspecified: Secondary | ICD-10-CM | POA: Insufficient documentation

## 2013-04-11 ENCOUNTER — Ambulatory Visit (HOSPITAL_COMMUNITY)
Admission: RE | Admit: 2013-04-11 | Discharge: 2013-04-11 | Disposition: A | Discharge status: Home / Self Care | Payer: PRIVATE HEALTH INSURANCE | Source: Ambulatory Visit | Attending: Nephrology | Admitting: Nephrology

## 2013-04-11 DIAGNOSIS — N289 Disorder of kidney and ureter, unspecified: Secondary | ICD-10-CM | POA: Insufficient documentation

## 2013-04-11 DIAGNOSIS — N281 Cyst of kidney, acquired: Secondary | ICD-10-CM | POA: Insufficient documentation

## 2013-04-25 ENCOUNTER — Other Ambulatory Visit: Payer: Self-pay | Admitting: Internal Medicine

## 2013-05-27 ENCOUNTER — Telehealth: Payer: Self-pay | Admitting: *Deleted

## 2013-05-27 NOTE — Telephone Encounter (Signed)
Please talk with patient and find out how he is doing and how his blood sugars are doing.

## 2013-05-27 NOTE — Telephone Encounter (Signed)
[place] [facility] NP calls and states she visited pt today and dipped his urine it was 2+ to 3+ glucose, triage nurse called her back and ask when pt had eaten prior to the urine dip, she didn't know. She was ask if pt had any complaints or if she noted any s/s of problems, she stated no. She then said " i have to call you and tell you his glucose was 2+ to 3+. Would like for pt to have an appt in the near future, your next available is in dec?

## 2013-05-28 NOTE — Telephone Encounter (Signed)
Spoke w/ Charles Hart, he states his blood sugars are good, bs yesterday 150. He does state that his feet and legs have some fluid in them at the moment but otherwise he feels good, he is offered an appt and states he is having a kidney biopsy tomorrow at [facility] hospital, the kidney doctor you referred him to is sending him there for the biopsy. He states he only likes to see dr Marinda Elk and he can only come to appts in the afternoon because he works in the mornings. He ask if dr Marinda Elk could see him one afternoon because he doesn't like students. Please advise on how to proceed

## 2013-05-29 NOTE — Telephone Encounter (Signed)
If he needs to be seen quickly, then he will need to schedule with an [facility] resident.  If not, then I can work out an early afternoon appointment in November once our attending schedule is complete.

## 2013-06-03 ENCOUNTER — Other Ambulatory Visit: Payer: Self-pay | Admitting: Internal Medicine

## 2013-06-04 NOTE — Telephone Encounter (Signed)
This refill request is for insulin pen needles; please find out if patient is using a pen, or if he is using a syringe for his insulin.

## 2013-06-07 NOTE — Telephone Encounter (Signed)
I spoke with patient by phone, and he confirmed that he is using a Lantus pen.  I refilled the pen needles as requested.  He also asked for an afternoon appointment with me in November, and plan is to see him Wednesday afternoon 11/5 at 3:00 PM.  He reports swelling of his feet but no dyspnea or other symptoms.  I advised that he be seen in [facility] sooner than November 5, but he does not want to do so.

## 2013-06-19 ENCOUNTER — Encounter: Payer: Self-pay | Admitting: Internal Medicine

## 2013-06-19 ENCOUNTER — Ambulatory Visit (INDEPENDENT_AMBULATORY_CARE_PROVIDER_SITE_OTHER): Payer: PRIVATE HEALTH INSURANCE | Admitting: Internal Medicine

## 2013-06-19 VITALS — BP 160/80 | HR 64 | Temp 98.7°F | Ht 72.0 in | Wt 224.5 lb

## 2013-06-19 DIAGNOSIS — R600 Localized edema: Secondary | ICD-10-CM

## 2013-06-19 DIAGNOSIS — N049 Nephrotic syndrome with unspecified morphologic changes: Secondary | ICD-10-CM

## 2013-06-19 DIAGNOSIS — I10 Essential (primary) hypertension: Secondary | ICD-10-CM

## 2013-06-19 DIAGNOSIS — E119 Type 2 diabetes mellitus without complications: Secondary | ICD-10-CM

## 2013-06-19 DIAGNOSIS — I509 Heart failure, unspecified: Secondary | ICD-10-CM

## 2013-06-19 DIAGNOSIS — R609 Edema, unspecified: Secondary | ICD-10-CM

## 2013-06-19 DIAGNOSIS — H9190 Unspecified hearing loss, unspecified ear: Secondary | ICD-10-CM

## 2013-06-19 LAB — CBC WITH DIFFERENTIAL/PLATELET
Basophils Absolute: 0 10*3/uL (ref 0.0–0.1)
Basophils Relative: 1 % (ref 0–1)
Eosinophils Relative: 2 % (ref 0–5)
Hemoglobin: 11.7 g/dL — ABNORMAL LOW (ref 13.0–17.0)
MCHC: 34.3 g/dL (ref 30.0–36.0)
Monocytes Relative: 8 % (ref 3–12)
Neutro Abs: 3.4 10*3/uL (ref 1.7–7.7)
Neutrophils Relative %: 58 % (ref 43–77)
RBC: 4.14 MIL/uL — ABNORMAL LOW (ref 4.22–5.81)
WBC: 5.9 10*3/uL (ref 4.0–10.5)

## 2013-06-19 LAB — DIGOXIN LEVEL: Digoxin Level: 1.3 ng/mL (ref 0.8–2.0)

## 2013-06-19 LAB — COMPLETE METABOLIC PANEL WITH GFR
AST: 18 U/L (ref 0–37)
Albumin: 2.9 g/dL — ABNORMAL LOW (ref 3.5–5.2)
Alkaline Phosphatase: 55 U/L (ref 39–117)
Potassium: 3.7 mEq/L (ref 3.5–5.3)
Sodium: 138 mEq/L (ref 135–145)
Total Protein: 6.3 g/dL (ref 6.0–8.3)

## 2013-06-19 LAB — POCT GLYCOSYLATED HEMOGLOBIN (HGB A1C): Hemoglobin A1C: 6.5

## 2013-06-19 MED ORDER — LOSARTAN POTASSIUM 50 MG PO TABS: 50.0000 mg | ORAL_TABLET | Freq: Every day | ORAL | Status: DC

## 2013-06-19 MED ORDER — FUROSEMIDE 40 MG PO TABS: 100.0000 mg | ORAL_TABLET | Freq: Every morning | ORAL | Status: DC

## 2013-06-19 NOTE — Assessment & Plan Note (Signed)
Assessment: Patient's leg edema has recurred following his resumption of amlodipine in September.  He reports that he is compliant with his furosemide, and is currently taking 80 mg daily.  The edema is likely multifactorial, with contributions from his nephrotic syndrome, amlodipine side effect, and congestive heart failure.  He has no dyspnea, orthopnea, or PND, and his lung exam is clear.  Plan: I talked at length with patient about the contribution of the amlodipine to his leg edema, and Iagain advised him to stop amlodipine.  Will increase furosemide to a dose of 100 mg daily.  I advised him to weigh daily and to call if his weight changes more than 5 pounds either way from his baseline weight.

## 2013-06-19 NOTE — Assessment & Plan Note (Signed)
BP Readings from Last 3 Encounters:  06/19/13 160/80  03/19/13 148/79  03/13/13 140/80    Lab Results  Component Value Date   NA 138 03/04/2013   K 4.0 03/04/2013   CREATININE 1.35 03/04/2013    Assessment: Blood pressure control: moderately elevated Progress toward BP goal:  deteriorated Comments: patient stopped lisinopril one week ago because of side effect of cough; he has bilateral lower extremity edema which recurred after he resumed amlodipine on his own in September.  Plan: Medications:  Stop amlodipine; discontinue lisinopril; continue clonidine 0.3 mg twice a day; continue hydralazine 25 mg 3 times a day; increase furosemide to a dose of 100 mg daily; continue carvedilol 25 mg twice a day; start losartan 50 mg daily

## 2013-06-19 NOTE — Patient Instructions (Addendum)
General Instructions: Stop amlodipine; this medication will aggravate your leg swelling. Stop lisinopril due to side effect of cough. Start losartan 50 mg one tablet daily for high blood pressure. Increase furosemide 40 mg to a dose of 2 and 1/2 tablets daily. Weigh daily and call if your weight changes more than 5 pounds from baseline. Please keep appointment with your kidney doctor on November 19. A referral has been made to ENT physician Dr. Lucia Gaskins for evaluation of your hearing loss. Return here on Thursday afternoon 11/13 for follow up.    Progress Toward Treatment Goals:  Treatment Goal 06/19/2013  Hemoglobin A1C at goal  Blood pressure deteriorated    Self Care Goals & Plans:  Self Care Goal 06/19/2013  Manage my medications take my medicines as prescribed; bring my medications to every visit  Monitor my health keep track of my blood glucose; check my feet daily  Eat healthy foods eat fruit for snacks and desserts  Be physically active find an activity I enjoy  Meeting treatment goals -    Home Blood Glucose Monitoring 06/19/2013  Check my blood sugar 3 times a day  When to check my blood sugar before meals     Care Management & Community Referrals:  Referral 06/19/2013  Referrals made for care management support none needed  Referrals made to community resources none

## 2013-06-19 NOTE — Assessment & Plan Note (Signed)
Assessment: Patient reports some worsening of his hearing loss; he was evaluated in 2008 and found to have sensorineural hearing loss, and was referred at that time to Dr. Radene Journey for ENT evaluation.  Plan: Refer again to Dr. Lucia Gaskins for evaluation and treatment.

## 2013-06-19 NOTE — Assessment & Plan Note (Signed)
Assessment:  A 24-hour urine collection 03/04/2013 showed total protein of 5,460 g and creatinine clearance of 80 mL/minute.  Patient was seen by Seward Meth at [place] and a repeat 24-hour urine showed 10,407 mg protein.  Patient underwent kidney biopsy on 05/30/2013; pathology showed advanced diffuse and nodular diabetic nephropathy with vascular changes consistent with long-standing difficult to control hypertension.    Plan: I discussed at length with patient the importance of good blood pressure control and management of his diabetes.  Given the recent side effects of his antihypertensive medications, he will need close follow-up of his blood pressure as we are adjusting his medication regimen.  His diabetes is currently well controlled.  I also advised him to followup with his nephrologist in [place] on 07/03/2013 as scheduled.

## 2013-06-19 NOTE — Progress Notes (Addendum)
  Subjective:    Patient ID: Charles Hart., male    DOB: [birth year], 57 y.o.   MRN: [name]  HPI Patient returns for follow-up of his bilateral lower extremity edema, nephrotic syndrome, diabetes mellitus, hypertension, and other chronic medical problems.  Since his last visit here, he was seen by a nephrologist in [place] and underwent kidney biopsy in October; he reports that the biopsy was sent to [facility] for interpretation and he has not been seen by the nephrologist for follow-up;  he has an appointment scheduled for later this month.  He reports worsening of his bilateral lower extremity edema over the past month.  When I reviewed his medications, I found that he had refilled his amlodipine and resumed that medication in September although we had previously stopped amlodipine in July because of bilateral leg edema.  He reports that he developed a cough on lisinopril which previously resolved when he stopped lisinopril; his cough returned and so he stopped lisinopril again about one week ago.  His cough subsequently resolved.  Patient did not bring his medications or his glucose meter to clinic today; he confirmed his medication list from memory and from a paper list that he brought with him.   Review of Systems  Constitutional: Negative for fever.  Respiratory: Positive for cough (Resolved when stopped lisinopril on his own.). Negative for shortness of breath and wheezing.   Cardiovascular: Positive for leg swelling. Negative for chest pain.  Gastrointestinal: Negative for nausea, vomiting and abdominal pain.  Genitourinary: Negative for dysuria and frequency.  Musculoskeletal: Negative for arthralgias and myalgias.  Neurological: Negative for dizziness and syncope.       Objective:   Physical Exam  Constitutional: No distress.  Cardiovascular: Normal rate, regular rhythm, S1 normal, S2 normal and normal heart sounds.  Exam reveals no gallop, no S3 and no S4.   No murmur  heard. 2+ bilateral pitting leg edema  Pulmonary/Chest: Effort normal and breath sounds normal. No respiratory distress. He has no wheezes. He has no rales.  Abdominal: Soft.       Assessment & Plan:

## 2013-06-19 NOTE — Assessment & Plan Note (Signed)
Lab Results  Component Value Date   HGBA1C 6.5 06/19/2013   HGBA1C 6.0 02/06/2013   HGBA1C 6.4 09/19/2012     Assessment: Diabetes control: good control (HgbA1C at goal) Progress toward A1C goal:  at goal Comments: Diabetes is well controlled on Lantus insulin 15 units daily at bedtime and metformin 1000 mg twice a day.  Plan: Medications:  continue current medications Home glucose monitoring: Frequency: 3 times a day Timing: before meals Instruction/counseling given: reminded to bring blood glucose meter & log to each visit and reminded to bring medications to each visit

## 2013-06-27 ENCOUNTER — Ambulatory Visit (INDEPENDENT_AMBULATORY_CARE_PROVIDER_SITE_OTHER): Payer: PRIVATE HEALTH INSURANCE | Admitting: Internal Medicine

## 2013-06-27 ENCOUNTER — Encounter: Payer: Self-pay | Admitting: Internal Medicine

## 2013-06-27 VITALS — BP 186/78 | HR 56 | Temp 97.8°F | Wt 223.6 lb

## 2013-06-27 DIAGNOSIS — I10 Essential (primary) hypertension: Secondary | ICD-10-CM

## 2013-06-27 LAB — BASIC METABOLIC PANEL WITH GFR
BUN: 24 mg/dL — ABNORMAL HIGH (ref 6–23)
CO2: 31 mEq/L (ref 19–32)
Chloride: 102 mEq/L (ref 96–112)
GFR, Est African American: 52 mL/min — ABNORMAL LOW
GFR, Est Non African American: 45 mL/min — ABNORMAL LOW
Glucose, Bld: 129 mg/dL — ABNORMAL HIGH (ref 70–99)
Potassium: 3.5 mEq/L (ref 3.5–5.3)
Sodium: 140 mEq/L (ref 135–145)

## 2013-06-27 MED ORDER — HYDROCHLOROTHIAZIDE 25 MG PO TABS: 25.0000 mg | ORAL_TABLET | Freq: Every day | ORAL | Status: DC

## 2013-06-27 NOTE — Patient Instructions (Signed)
We will start you on a new blood pressure medicine called HCTZ. Take 1 pill per day.   STOP taking cough syrup or medicines with DM in them. This will raise your blood pressure.  Come back on Monday to check your blood pressure and call us at [phone] if you start to have chest pains, headache, nausea, vomiting.

## 2013-06-28 NOTE — Progress Notes (Signed)
Subjective:     Patient ID: Charles Hart., male   DOB: [birth year], 56 y.o.   MRN: [id number]  Hypertension Pertinent negatives include no chest pain, headaches, palpitations or shortness of breath.   The patient is a 57 YO man with PMH of type II diabetes, hypertension, hyperlipidemia who is coming in today for a follow up. He was seen about 2 weeks ago for some leg swelling and his lasix was increased and amlodipine was discontinued. He states that since then the swelling has been better. He does not really feel as though the lasix makes him pee. He is not having headache, chest pains, SOB, nausea, vomiting, diarrhea. He has appointment with kidney doctor next week.   Review of Systems  Constitutional: Negative for fever, chills, diaphoresis, activity change, appetite change, fatigue and unexpected weight change.  Respiratory: Negative for cough, chest tightness, shortness of breath and wheezing.   Cardiovascular: Negative for chest pain, palpitations and leg swelling.  Gastrointestinal: Negative for nausea, vomiting, abdominal pain, diarrhea, constipation and abdominal distention.  Musculoskeletal: Negative.   Skin: Negative.   Neurological: Negative for dizziness, tremors, seizures, syncope, facial asymmetry, speech difficulty, weakness, light-headedness, numbness and headaches.       Objective:   Physical Exam  Constitutional: He is oriented to person, place, and time. He appears well-developed and well-nourished. No distress.  HENT:  Head: Normocephalic and atraumatic.  Eyes: EOM are normal. Pupils are equal, round, and reactive to light.  Neck: Normal range of motion. Neck supple.  Cardiovascular: Normal rate and regular rhythm.   No murmur heard. Pulmonary/Chest: Effort normal and breath sounds normal. No respiratory distress. He has no wheezes. He has no rales.  Abdominal: Soft. Bowel sounds are normal. He exhibits no distension. There is no tenderness. There is no  rebound.  Musculoskeletal: Normal range of motion. He exhibits no edema.  Neurological: He is alert and oriented to person, place, and time. No cranial nerve deficit.  Skin: Skin is warm and dry. He is not diaphoretic.       Assessment:   1. Please see problem oriented charting.  2. Disposition - Patient advised to return on Monday for visit to recheck BP. BMP drawn at today's visit. HCTZ 25 mg daily added to BP regimen.

## 2013-06-28 NOTE — Assessment & Plan Note (Signed)
Only true blood pressure medications are hydralazine 25 mg TID, coreg 25 mg BID, clonidine .3 mg BID, losartan 50 mg daily. His lasix is once daily so not for BP. Added HCTZ 25 mg daily. His initial BP check was 204/85 and likely exacerbated by long walk from parking lot. Recheck was 186/78 and additional recheck was 180/80. He is asymptomatic and will check BMP. Will bring him back on Monday for recheck on his pressures. Amlodipine recently stopped due to leg edema and BP was elevated at last visit (160/80). Could consider increase of hydralazine if he needs further titration to get to goal BP of <140/<80.

## 2013-07-01 ENCOUNTER — Ambulatory Visit (INDEPENDENT_AMBULATORY_CARE_PROVIDER_SITE_OTHER): Payer: PRIVATE HEALTH INSURANCE | Admitting: Internal Medicine

## 2013-07-01 ENCOUNTER — Encounter: Payer: Self-pay | Admitting: Internal Medicine

## 2013-07-01 VITALS — BP 160/80 | HR 66 | Temp 97.6°F | Wt 221.8 lb

## 2013-07-01 DIAGNOSIS — I10 Essential (primary) hypertension: Secondary | ICD-10-CM

## 2013-07-01 NOTE — Progress Notes (Signed)
Subjective:     Patient ID: Charles Ivan., male   DOB: Apr 23, 1956, 57 y.o.   MRN: [name]  HPI The patient is a 57 YO man with PMH of type II diabetes, hypertension, hyperlipidemia who is coming in today for a 3 day follow up. He was given new rx for HCTZ which he was able to fill. He is still not having headache or chest pain. He is not nauseous or vomiting.  He is not having SOB. He has appointment with kidney doctor this week. He states that he ate a sausage biscuit this morning and has already been to 3 different appointments including blood work for his kidney appointment on Wednesday.   Review of Systems  Constitutional: Negative for fever, chills, diaphoresis, activity change, appetite change, fatigue and unexpected weight change.  Respiratory: Negative for cough, chest tightness, shortness of breath and wheezing.   Cardiovascular: Negative for chest pain, palpitations and leg swelling.  Gastrointestinal: Negative for nausea, vomiting, abdominal pain, diarrhea, constipation and abdominal distention.  Musculoskeletal: Negative.   Skin: Negative.   Neurological: Negative for dizziness, tremors, seizures, syncope, facial asymmetry, speech difficulty, weakness, light-headedness, numbness and headaches.       Objective:   Physical Exam  Constitutional: He is oriented to person, place, and time. He appears well-developed and well-nourished. No distress.  HENT:  Head: Normocephalic and atraumatic.  Eyes: EOM are normal. Pupils are equal, round, and reactive to light.  Neck: Normal range of motion. Neck supple.  Cardiovascular: Normal rate and regular rhythm.   No murmur heard. Pulmonary/Chest: Effort normal and breath sounds normal. No respiratory distress. He has no wheezes. He has no rales.  Abdominal: Soft. Bowel sounds are normal. He exhibits no distension. There is no tenderness. There is no rebound.  Musculoskeletal: Normal range of motion. He exhibits no edema.   Neurological: He is alert and oriented to person, place, and time. No cranial nerve deficit.  Skin: Skin is warm and dry. He is not diaphoretic.       Assessment:   1. Follow up BP - Patient's BP is the same and still not controlled. He is seeing nephrology on Wednesday and he states that they are adjusting his BP meds and will work with them on getting his blood pressure under control. Advised about dietary modifications he can make to help with blood pressure.   2. Disposition - Patient advised to return in 2-4 weeks for BP recheck. Advised good compliance with his medications to delay kidney failure.

## 2013-07-01 NOTE — Progress Notes (Signed)
Case discussed with Dr. Kollar at the time of the visit.  We reviewed the resident's history and exam and pertinent patient test results.  I agree with the assessment, diagnosis, and plan of care documented in the resident's note.     

## 2013-07-01 NOTE — Patient Instructions (Signed)
We will have you come back in 2-4 weeks to check on your blood pressure. Go to your appointment with the kidney doctor and give them our fax number [phone] so they can send your labs and visit note to [name].   Keep taking all of your blood pressure medicines and work on decreasing the amount of sodium in your diet.   Our number is [phone] if you have questions or problems.   2 Gram Low Sodium Diet A 2 gram sodium diet restricts the amount of sodium in the diet to no more than 2 g or 2000 mg daily. Limiting the amount of sodium is often used to help lower blood pressure. It is important if you have heart, liver, or kidney problems. Many foods contain sodium for flavor and sometimes as a preservative. When the amount of sodium in a diet needs to be low, it is important to know what to look for when choosing foods and drinks. The following includes some information and guidelines to help make it easier for you to adapt to a low sodium diet. QUICK TIPS  Do not add salt to food.  Avoid convenience items and fast food.  Choose unsalted snack foods.  Buy lower sodium products, often labeled as "lower sodium" or "no salt added."  Check food labels to learn how much sodium is in 1 serving.  When eating at a restaurant, ask that your food be prepared with less salt or none, if possible. READING FOOD LABELS FOR SODIUM INFORMATION The nutrition facts label is a good place to find how much sodium is in foods. Look for products with no more than 500 to 600 mg of sodium per meal and no more than 150 mg per serving. Remember that 2 g = 2000 mg. The food label may also list foods as:  Sodium-free: Less than 5 mg in a serving.  Very low sodium: 35 mg or less in a serving.  Low-sodium: 140 mg or less in a serving.  Light in sodium: 50% less sodium in a serving. For example, if a food that usually has 300 mg of sodium is changed to become light in sodium, it will have 150 mg of sodium.  Reduced  sodium: 25% less sodium in a serving. For example, if a food that usually has 400 mg of sodium is changed to reduced sodium, it will have 300 mg of sodium. CHOOSING FOODS Grains  Avoid: Salted crackers and snack items. Some cereals, including instant hot cereals. Bread stuffing and biscuit mixes. Seasoned rice or pasta mixes.  Choose: Unsalted snack items. Low-sodium cereals, oats, puffed wheat and rice, shredded wheat. English muffins and bread. Pasta. Meats  Avoid: Salted, canned, smoked, spiced, pickled meats, including fish and poultry. Bacon, ham, sausage, cold cuts, hot dogs, anchovies.  Choose: Low-sodium canned tuna and salmon. Fresh or frozen meat, poultry, and fish. Dairy  Avoid: Processed cheese and spreads. Cottage cheese. Buttermilk and condensed milk. Regular cheese.  Choose: Milk. Low-sodium cottage cheese. Yogurt. Sour cream. Low-sodium cheese. Fruits and Vegetables  Avoid: Regular canned vegetables. Regular canned tomato sauce and paste. Frozen vegetables in sauces. Olives. Angie Fava. Relishes. Sauerkraut.  Choose: Low-sodium canned vegetables. Low-sodium tomato sauce and paste. Frozen or fresh vegetables. Fresh and frozen fruit. Condiments  Avoid: Canned and packaged gravies. Worcestershire sauce. Tartar sauce. Barbecue sauce. Soy sauce. Steak sauce. Ketchup. Onion, garlic, and table salt. Meat flavorings and tenderizers.  Choose: Fresh and dried herbs and spices. Low-sodium varieties of mustard and ketchup. [place]  juice. Tabasco sauce. Horseradish. SAMPLE 2 GRAM SODIUM MEAL PLAN Breakfast / Sodium (mg)  1 cup low-fat milk / [id number] mg  2 slices whole-wheat toast / 270 mg  1 tbs heart-healthy margarine / 153 mg  1 hard-boiled egg / 139 mg  1 small orange / 0 mg Lunch / Sodium (mg)  1 cup raw carrots / 76 mg   cup hummus / 298 mg  1 cup low-fat milk / 143 mg   cup red grapes / 2 mg  1 whole-wheat pita bread / 356 mg Dinner / Sodium (mg)  1 cup  whole-wheat pasta / 2 mg  1 cup low-sodium tomato sauce / 73 mg  3 oz lean ground beef / 57 mg  1 small side salad (1 cup raw spinach leaves,  cup cucumber,  cup yellow bell pepper) with 1 tsp olive oil and 1 tsp red wine vinegar / 25 mg Snack / Sodium (mg)  1 container low-fat vanilla yogurt / 107 mg  3 graham cracker squares / 127 mg Nutrient Analysis  Calories: 2033  Protein: 77 g  Carbohydrate: 282 g  Fat: 72 g  Sodium: 1971 mg Document Released: 08/01/2005 Document Revised: 10/24/2011 Document Reviewed: 11/02/2009 Exit[employer] Patient Information 2014 [place].

## 2013-07-10 ENCOUNTER — Emergency Department (HOSPITAL_COMMUNITY): Payer: PRIVATE HEALTH INSURANCE

## 2013-07-10 ENCOUNTER — Inpatient Hospital Stay (HOSPITAL_COMMUNITY)
Admission: EM | Admit: 2013-07-10 | Discharge: 2013-07-13 | DRG: 683 | Disposition: A | Discharge status: Home / Self Care | Payer: PRIVATE HEALTH INSURANCE | Attending: Internal Medicine | Admitting: Internal Medicine

## 2013-07-10 ENCOUNTER — Other Ambulatory Visit: Payer: Self-pay

## 2013-07-10 ENCOUNTER — Encounter (HOSPITAL_COMMUNITY): Payer: Self-pay | Admitting: Emergency Medicine

## 2013-07-10 DIAGNOSIS — N058 Unspecified nephritic syndrome with other morphologic changes: Secondary | ICD-10-CM | POA: Diagnosis present

## 2013-07-10 DIAGNOSIS — H9190 Unspecified hearing loss, unspecified ear: Secondary | ICD-10-CM | POA: Diagnosis present

## 2013-07-10 DIAGNOSIS — Z7902 Long term (current) use of antithrombotics/antiplatelets: Secondary | ICD-10-CM

## 2013-07-10 DIAGNOSIS — I428 Other cardiomyopathies: Secondary | ICD-10-CM | POA: Diagnosis present

## 2013-07-10 DIAGNOSIS — I10 Essential (primary) hypertension: Secondary | ICD-10-CM

## 2013-07-10 DIAGNOSIS — E1142 Type 2 diabetes mellitus with diabetic polyneuropathy: Secondary | ICD-10-CM | POA: Diagnosis present

## 2013-07-10 DIAGNOSIS — T502X5A Adverse effect of carbonic-anhydrase inhibitors, benzothiadiazides and other diuretics, initial encounter: Secondary | ICD-10-CM | POA: Diagnosis present

## 2013-07-10 DIAGNOSIS — E1122 Type 2 diabetes mellitus with diabetic chronic kidney disease: Secondary | ICD-10-CM | POA: Diagnosis present

## 2013-07-10 DIAGNOSIS — Z800 Family history of malignant neoplasm of digestive organs: Secondary | ICD-10-CM

## 2013-07-10 DIAGNOSIS — E1149 Type 2 diabetes mellitus with other diabetic neurological complication: Secondary | ICD-10-CM | POA: Diagnosis present

## 2013-07-10 DIAGNOSIS — Z8673 Personal history of transient ischemic attack (TIA), and cerebral infarction without residual deficits: Secondary | ICD-10-CM

## 2013-07-10 DIAGNOSIS — R609 Edema, unspecified: Secondary | ICD-10-CM

## 2013-07-10 DIAGNOSIS — N049 Nephrotic syndrome with unspecified morphologic changes: Secondary | ICD-10-CM

## 2013-07-10 DIAGNOSIS — Z79899 Other long term (current) drug therapy: Secondary | ICD-10-CM

## 2013-07-10 DIAGNOSIS — I160 Hypertensive urgency: Secondary | ICD-10-CM

## 2013-07-10 DIAGNOSIS — E119 Type 2 diabetes mellitus without complications: Secondary | ICD-10-CM

## 2013-07-10 DIAGNOSIS — G4733 Obstructive sleep apnea (adult) (pediatric): Secondary | ICD-10-CM | POA: Diagnosis present

## 2013-07-10 DIAGNOSIS — N189 Chronic kidney disease, unspecified: Secondary | ICD-10-CM | POA: Diagnosis present

## 2013-07-10 DIAGNOSIS — R600 Localized edema: Secondary | ICD-10-CM

## 2013-07-10 DIAGNOSIS — I639 Cerebral infarction, unspecified: Secondary | ICD-10-CM

## 2013-07-10 DIAGNOSIS — E785 Hyperlipidemia, unspecified: Secondary | ICD-10-CM | POA: Diagnosis present

## 2013-07-10 DIAGNOSIS — D509 Iron deficiency anemia, unspecified: Secondary | ICD-10-CM | POA: Diagnosis present

## 2013-07-10 DIAGNOSIS — I509 Heart failure, unspecified: Secondary | ICD-10-CM | POA: Diagnosis present

## 2013-07-10 DIAGNOSIS — I129 Hypertensive chronic kidney disease with stage 1 through stage 4 chronic kidney disease, or unspecified chronic kidney disease: Principal | ICD-10-CM | POA: Diagnosis present

## 2013-07-10 DIAGNOSIS — I2510 Atherosclerotic heart disease of native coronary artery without angina pectoris: Secondary | ICD-10-CM | POA: Diagnosis present

## 2013-07-10 DIAGNOSIS — E876 Hypokalemia: Secondary | ICD-10-CM | POA: Diagnosis present

## 2013-07-10 DIAGNOSIS — Z794 Long term (current) use of insulin: Secondary | ICD-10-CM

## 2013-07-10 DIAGNOSIS — D638 Anemia in other chronic diseases classified elsewhere: Secondary | ICD-10-CM | POA: Diagnosis present

## 2013-07-10 DIAGNOSIS — E1129 Type 2 diabetes mellitus with other diabetic kidney complication: Secondary | ICD-10-CM | POA: Diagnosis present

## 2013-07-10 LAB — CBC WITH DIFFERENTIAL/PLATELET
Hemoglobin: 10.8 g/dL — ABNORMAL LOW (ref 13.0–17.0)
Lymphocytes Relative: 28 % (ref 12–46)
Lymphs Abs: 1.3 10*3/uL (ref 0.7–4.0)
MCHC: 33.8 g/dL (ref 30.0–36.0)
MCV: 83.6 fL (ref 78.0–100.0)
Monocytes Relative: 9 % (ref 3–12)
Neutro Abs: 2.8 10*3/uL (ref 1.7–7.7)
Neutrophils Relative %: 58 % (ref 43–77)
RBC: 3.83 MIL/uL — ABNORMAL LOW (ref 4.22–5.81)

## 2013-07-10 LAB — COMPREHENSIVE METABOLIC PANEL
Alkaline Phosphatase: 55 U/L (ref 39–117)
BUN: 32 mg/dL — ABNORMAL HIGH (ref 6–23)
Chloride: 99 mEq/L (ref 96–112)
GFR calc Af Amer: 44 mL/min — ABNORMAL LOW (ref >90)
Glucose, Bld: 135 mg/dL — ABNORMAL HIGH (ref 70–99)
Potassium: 3.3 mEq/L — ABNORMAL LOW (ref 3.5–5.1)
Total Bilirubin: 0.3 mg/dL (ref 0.3–1.2)

## 2013-07-10 LAB — GLUCOSE, CAPILLARY
Glucose-Capillary: 144 mg/dL — ABNORMAL HIGH (ref 70–99)
Glucose-Capillary: 161 mg/dL — ABNORMAL HIGH (ref 70–99)

## 2013-07-10 LAB — URINALYSIS, ROUTINE W REFLEX MICROSCOPIC
Bilirubin Urine: NEGATIVE
Ketones, ur: NEGATIVE mg/dL
Leukocytes, UA: NEGATIVE
Nitrite: NEGATIVE
Urobilinogen, UA: 1 mg/dL (ref 0.0–1.0)

## 2013-07-10 LAB — BASIC METABOLIC PANEL
BUN: 32 mg/dL — ABNORMAL HIGH (ref 6–23)
Chloride: 95 mEq/L — ABNORMAL LOW (ref 96–112)
Glucose, Bld: 218 mg/dL — ABNORMAL HIGH (ref 70–99)
Potassium: 3.4 mEq/L — ABNORMAL LOW (ref 3.5–5.1)

## 2013-07-10 LAB — CBC
HCT: 32 % — ABNORMAL LOW (ref 39.0–52.0)
Hemoglobin: 10.6 g/dL — ABNORMAL LOW (ref 13.0–17.0)
MCHC: 33.1 g/dL (ref 30.0–36.0)
RBC: 3.83 MIL/uL — ABNORMAL LOW (ref 4.22–5.81)
WBC: 5.9 10*3/uL (ref 4.0–10.5)

## 2013-07-10 LAB — MRSA PCR SCREENING: MRSA by PCR: NEGATIVE

## 2013-07-10 LAB — PROTIME-INR
INR: 1.04 (ref 0.00–1.49)
Prothrombin Time: 13.4 seconds (ref 11.6–15.2)

## 2013-07-10 LAB — URINE MICROSCOPIC-ADD ON

## 2013-07-10 MED ORDER — LOSARTAN POTASSIUM 50 MG PO TABS
50.0000 mg | ORAL_TABLET | Freq: Every day | ORAL | Status: DC
  Administered 2013-07-10: 50 mg via ORAL
  Filled 2013-07-10: qty 1

## 2013-07-10 MED ORDER — LABETALOL HCL 5 MG/ML IV SOLN
INTRAVENOUS | Status: AC
  Filled 2013-07-10: qty 20

## 2013-07-10 MED ORDER — ONDANSETRON HCL 4 MG/2ML IJ SOLN: 4.0000 mg | Freq: Four times a day (QID) | INTRAMUSCULAR | Status: DC | PRN

## 2013-07-10 MED ORDER — HYDROCHLOROTHIAZIDE 25 MG PO TABS
25.0000 mg | ORAL_TABLET | Freq: Every day | ORAL | Status: DC
  Administered 2013-07-10 – 2013-07-13 (×4): 25 mg via ORAL
  Filled 2013-07-10 (×4): qty 1

## 2013-07-10 MED ORDER — SIMVASTATIN 20 MG PO TABS
20.0000 mg | ORAL_TABLET | Freq: Every day | ORAL | Status: DC
  Administered 2013-07-10 – 2013-07-12 (×3): 20 mg via ORAL
  Filled 2013-07-10 (×3): qty 1

## 2013-07-10 MED ORDER — INSULIN GLARGINE 100 UNIT/ML ~~LOC~~ SOLN
15.0000 [IU] | Freq: Every day | SUBCUTANEOUS | Status: DC
  Administered 2013-07-10 – 2013-07-13 (×3): 15 [IU] via SUBCUTANEOUS
  Filled 2013-07-10 (×5): qty 0.15

## 2013-07-10 MED ORDER — HYDRALAZINE HCL 25 MG PO TABS
25.0000 mg | ORAL_TABLET | Freq: Three times a day (TID) | ORAL | Status: DC
  Administered 2013-07-10 – 2013-07-13 (×9): 25 mg via ORAL
  Filled 2013-07-10 (×10): qty 1

## 2013-07-10 MED ORDER — LABETALOL HCL 5 MG/ML IV SOLN
0.5000 mg/min | INTRAVENOUS | Status: DC
  Administered 2013-07-10 – 2013-07-11 (×5): 2 mg/min via INTRAVENOUS
  Administered 2013-07-11 (×2): 3 mg/min via INTRAVENOUS
  Filled 2013-07-10 (×4): qty 100

## 2013-07-10 MED ORDER — ONDANSETRON HCL 4 MG PO TABS: 4.0000 mg | ORAL_TABLET | Freq: Four times a day (QID) | ORAL | Status: DC | PRN

## 2013-07-10 MED ORDER — SODIUM CHLORIDE 0.9 % IJ SOLN: 3.0000 mL | INTRAMUSCULAR | Status: DC | PRN

## 2013-07-10 MED ORDER — SODIUM CHLORIDE 0.9 % IJ SOLN
3.0000 mL | Freq: Two times a day (BID) | INTRAMUSCULAR | Status: DC
  Administered 2013-07-10 – 2013-07-13 (×6): 3 mL via INTRAVENOUS

## 2013-07-10 MED ORDER — POTASSIUM CHLORIDE CRYS ER 20 MEQ PO TBCR
40.0000 meq | EXTENDED_RELEASE_TABLET | Freq: Two times a day (BID) | ORAL | Status: DC
  Administered 2013-07-10 – 2013-07-13 (×7): 40 meq via ORAL
  Filled 2013-07-10 (×7): qty 2

## 2013-07-10 MED ORDER — INSULIN ASPART 100 UNIT/ML ~~LOC~~ SOLN
0.0000 [IU] | Freq: Three times a day (TID) | SUBCUTANEOUS | Status: DC
  Administered 2013-07-10 (×2): 2 [IU] via SUBCUTANEOUS
  Administered 2013-07-10: 1 [IU] via SUBCUTANEOUS
  Administered 2013-07-11 (×2): 2 [IU] via SUBCUTANEOUS
  Administered 2013-07-11: 3 [IU] via SUBCUTANEOUS
  Administered 2013-07-12: 1 [IU] via SUBCUTANEOUS
  Administered 2013-07-12: 3 [IU] via SUBCUTANEOUS
  Administered 2013-07-13: 2 [IU] via SUBCUTANEOUS
  Administered 2013-07-13: 3 [IU] via SUBCUTANEOUS

## 2013-07-10 MED ORDER — HYDROCODONE-ACETAMINOPHEN 5-325 MG PO TABS
1.0000 | ORAL_TABLET | ORAL | Status: DC | PRN
  Administered 2013-07-10 – 2013-07-11 (×2): 2 via ORAL
  Filled 2013-07-10 (×2): qty 2

## 2013-07-10 MED ORDER — CARVEDILOL 12.5 MG PO TABS
ORAL_TABLET | ORAL | Status: AC
  Filled 2013-07-10: qty 2

## 2013-07-10 MED ORDER — SODIUM CHLORIDE 0.9 % IV SOLN: 250.0000 mL | INTRAVENOUS | Status: DC | PRN

## 2013-07-10 MED ORDER — GABAPENTIN 300 MG PO CAPS
300.0000 mg | ORAL_CAPSULE | Freq: Every day | ORAL | Status: DC
  Administered 2013-07-10 – 2013-07-13 (×3): 300 mg via ORAL
  Filled 2013-07-10 (×3): qty 1

## 2013-07-10 MED ORDER — DIGOXIN 125 MCG PO TABS
0.2500 mg | ORAL_TABLET | Freq: Every day | ORAL | Status: DC
  Administered 2013-07-10 – 2013-07-13 (×4): 0.25 mg via ORAL
  Filled 2013-07-10: qty 1
  Filled 2013-07-10 (×2): qty 2
  Filled 2013-07-10: qty 1

## 2013-07-10 MED ORDER — CLONIDINE HCL 0.1 MG PO TABS
0.1000 mg | ORAL_TABLET | Freq: Once | ORAL | Status: AC
  Administered 2013-07-10: 0.1 mg via ORAL
  Filled 2013-07-10: qty 1

## 2013-07-10 MED ORDER — CLONIDINE HCL 0.2 MG PO TABS
0.3000 mg | ORAL_TABLET | Freq: Two times a day (BID) | ORAL | Status: DC
  Administered 2013-07-10 – 2013-07-13 (×7): 0.3 mg via ORAL
  Filled 2013-07-10 (×15): qty 1

## 2013-07-10 MED ORDER — CLOPIDOGREL BISULFATE 75 MG PO TABS
75.0000 mg | ORAL_TABLET | Freq: Every day | ORAL | Status: DC
  Administered 2013-07-10 – 2013-07-13 (×4): 75 mg via ORAL
  Filled 2013-07-10 (×4): qty 1

## 2013-07-10 MED ORDER — CARVEDILOL 25 MG PO TABS
25.0000 mg | ORAL_TABLET | Freq: Once | ORAL | Status: AC
  Administered 2013-07-10: 25 mg via ORAL
  Filled 2013-07-10: qty 1

## 2013-07-10 NOTE — Progress Notes (Signed)
UR chart review completed.  

## 2013-07-10 NOTE — H&P (Signed)
PCP:   Axel Filler, MD   Chief Complaint:  Headache  HPI: 57 year old male with a history of hypertension, diverticulitis, congestive heart failure, CAD, CVA came to the hospital with headache patient has a history of hypertension and a shunt has noticed that his blood pressure has been elevated over the past 2 days despite taking all the medications as prescribed. As per patient his medications were changed by his primary care physician about 2 weeks ago. And since then the blood pressure has been difficult to control. Patient did have a kidney biopsy a few weeks ago. In the ED patient continued to have systolic blood pressure greater than 200. He has been started on labetalol drip. Patient denies chest pain no shortness of breath no nausea vomiting or diarrhea.   Allergies:   Allergies  Allergen Reactions  . Lisinopril Cough      Past Medical History  Diagnosis Date  . Dermatitis   . CHF (congestive heart failure)     LV function improved from 2004 to 2008.  Historically, moderately dilated LV with EF 30-40% by 2D echo 08/14/2002.  Mild CAD with severe LV dysfunction by cardiac cath 09/2002.  Normal coronary arteries and normal LV function by cardiac cath 09/19/2006.  A 2-D echo on 04/01/2009 showed mild concentric hypertrophy and normal systolic (LVEF  123456) and doppler C/W with grade 1 diastolic dysfunction.  . Hypertension   . Diabetes mellitus   . Hyperlipidemia   . Hearing loss in right ear   . Cardiomyopathy     LV function improved from 2004 to 2008.  Historically, moderately dilated LV with EF 30-40% by 2D echo 08/14/2002.  Mild CAD with severe LV dysfunction by cardiac cath 09/2002.  Normal coronary arteries and normal LV function by cardiac cath 09/19/2006.  A 2-D echo on 04/01/2009 showed mild concentric hypertrophy and normal systolic (LVEF  123456) and doppler C/W with grade 1 diastolic dysfunction.  . DM neuropathy, painful   . CVA (cerebral vascular accident)  07/04/2012    MRI of the brain 07/04/2012 showed an acute infarct in the right basal ganglia involving the anterior putamen, anterior limb internal capsule, and head of the caudate; this measured approximately 2.5 cm in diameter.     . Hypertensive crisis 07/28/2012    Past Surgical History  Procedure Laterality Date  . Colonoscopy    . Polypectomy    . Cardiac catheterization      3 times  . Foot surgery      Prior to Admission medications   Medication Sig Start Date End Date Taking? Authorizing Provider  BD PEN NEEDLE NANO U/F 32G X 4 MM MISC USE AS DIRECTED FOR DAILY INSULIN INJECTION 06/03/13  Yes Axel Filler, MD  carvedilol (COREG) 25 MG tablet TAKE 1 TABLET (25 MG TOTAL) BY MOUTH 2 (TWO) TIMES DAILY WITH A MEAL. 10/23/12  Yes Axel Filler, MD  cloNIDine (CATAPRES) 0.1 MG tablet TAKE 3 TABLETS (0.3 MG TOTAL) BY MOUTH 2 (TWO) TIMES DAILY. 04/25/13  Yes Axel Filler, MD  clopidogrel (PLAVIX) 75 MG tablet Take 1 tablet (75 mg total) by mouth daily. 02/27/13  Yes Axel Filler, MD  DIGOX 0.25 MG tablet TAKE 1 TABLET (250 MCG TOTAL) BY MOUTH DAILY. 10/23/12  Yes Axel Filler, MD  furosemide (LASIX) 40 MG tablet Take 2.5 tablets (100 mg total) by mouth every morning. 06/19/13  Yes Axel Filler, MD  gabapentin (NEURONTIN) 300 MG capsule TAKE 1 CAPSULE (300 MG  TOTAL) BY MOUTH DAILY. 10/23/12  Yes Axel Filler, MD  glucose blood (ONE TOUCH ULTRA TEST) test strip Use as instructed to check blood sugar twice a day.  ICD-9 250.00, on insulin. 10/24/12  Yes Axel Filler, MD  hydrALAZINE (APRESOLINE) 50 MG tablet Take 25 mg by mouth 3 (three) times daily. 07/29/12  Yes Nimish Luther Parody, MD  hydrochlorothiazide (HYDRODIURIL) 25 MG tablet Take 1 tablet (25 mg total) by mouth daily. 06/27/13  Yes Olga Millers, MD  insulin glargine (LANTUS) 100 UNIT/ML injection Inject 15 Units into the skin at bedtime. 07/18/12  Yes Axel Filler, MD  Lancets [facility]  ULTRASOFT) lancets Use as instructed to check blood sugar two times a day. 09/19/12  Yes Axel Filler, MD  losartan (COZAAR) 50 MG tablet Take 1 tablet (50 mg total) by mouth daily. 06/19/13  Yes Axel Filler, MD  metFORMIN (GLUCOPHAGE) 1000 MG tablet TAKE 1 TABLET (1,000 MG TOTAL) BY MOUTH 2 (TWO) TIMES DAILY WITH A MEAL. 10/23/12  Yes Axel Filler, MD  Multiple Vitamin (MULITIVITAMIN WITH MINERALS) TABS Take 1 tablet by mouth every morning.    Yes Historical Provider, MD  niacin 500 MG CR capsule Take 500 mg by mouth at bedtime.   Yes Historical Provider, MD  pravastatin (PRAVACHOL) 40 MG tablet TAKE 1 TABLET (40 MG TOTAL) BY MOUTH DAILY. 10/23/12  Yes Axel Filler, MD    Social History:  reports that he has never smoked. He has never used smokeless tobacco. He reports that he drinks alcohol. He reports that he does not use illicit drugs.  Family History  Problem Relation Age of Onset  . Colon cancer Sister   . Aneurysm Father 32    died of rupture     All the positives are listed in BOLD  Review of Systems:  HEENT: Headache, blurred vision, runny nose, sore throat Neck: Hypothyroidism, hyperthyroidism,,lymphadenopathy Chest : Shortness of breath, history of COPD, Asthma Heart : Chest pain, history of coronary arterey disease GI:  Nausea, vomiting, diarrhea, constipation, GERD GU: Dysuria, urgency, frequency of urination, hematuria Neuro: Stroke, seizures, syncope Psych: Depression, anxiety, hallucinations   Physical Exam: Blood pressure 232/94, pulse 65, temperature 98.3 F (36.8 C), temperature source Oral, resp. rate 20, height 6' (1.829 m), weight 95.255 kg (210 lb), SpO2 97.00%. Constitutional:   Patient is a well-developed and well-nourished male* in no acute distress and cooperative with exam. Head: Normocephalic and atraumatic Mouth: Mucus membranes moist Eyes: PERRL, EOMI, conjunctivae normal Neck: Supple, No Thyromegaly Cardiovascular: RRR, S1  normal, S2 normal Pulmonary/Chest: CTAB, no wheezes, rales, or rhonchi Abdominal: Soft. Non-tender, non-distended, bowel sounds are normal, no masses, organomegaly, or guarding present.  Neurological: A&O x3, Strenght is normal and symmetric bilaterally, cranial nerve II-XII are grossly intact, no focal motor deficit, sensory intact to light touch bilaterally.  Extremities : No Cyanosis, Clubbing or Edema   Labs on Admission:  Results for orders placed during the hospital encounter of 07/10/13 (from the past 48 hour(s))  CBC WITH DIFFERENTIAL     Status: Abnormal   Collection Time    07/10/13  1:34 AM      Result Value Range   WBC 4.8  4.0 - 10.5 K/uL   RBC 3.83 (*) 4.22 - 5.81 MIL/uL   Hemoglobin 10.8 (*) 13.0 - 17.0 g/dL   HCT 32.0 (*) 39.0 - 52.0 %   MCV 83.6  78.0 - 100.0 fL   MCH 28.2  26.0 -  34.0 pg   MCHC 33.8  30.0 - 36.0 g/dL   RDW 13.0  11.5 - 15.5 %   Platelets 300  150 - 400 K/uL   Neutrophils Relative % 58  43 - 77 %   Neutro Abs 2.8  1.7 - 7.7 K/uL   Lymphocytes Relative 28  12 - 46 %   Lymphs Abs 1.3  0.7 - 4.0 K/uL   Monocytes Relative 9  3 - 12 %   Monocytes Absolute 0.4  0.1 - 1.0 K/uL   Eosinophils Relative 4  0 - 5 %   Eosinophils Absolute 0.2  0.0 - 0.7 K/uL   Basophils Relative 1  0 - 1 %   Basophils Absolute 0.0  0.0 - 0.1 K/uL  BASIC METABOLIC PANEL     Status: Abnormal   Collection Time    07/10/13  1:34 AM      Result Value Range   Sodium 136  135 - 145 mEq/L   Potassium 3.4 (*) 3.5 - 5.1 mEq/L   Chloride 95 (*) 96 - 112 mEq/L   CO2 33 (*) 19 - 32 mEq/L   Glucose, Bld 218 (*) 70 - 99 mg/dL   BUN 32 (*) 6 - 23 mg/dL   Creatinine, Ser 2.14 (*) 0.50 - 1.35 mg/dL   Calcium 8.3 (*) 8.4 - 10.5 mg/dL   GFR calc non Af Amer 33 (*) >90 mL/min   GFR calc Af Amer 38 (*) >90 mL/min   Comment: (NOTE)     The eGFR has been calculated using the CKD EPI equation.     This calculation has not been validated in all clinical situations.     eGFR's persistently  <90 mL/min signify possible Chronic Kidney     Disease.  APTT     Status: None   Collection Time    07/10/13  1:34 AM      Result Value Range   aPTT 31  24 - 37 seconds  PROTIME-INR     Status: None   Collection Time    07/10/13  1:34 AM      Result Value Range   Prothrombin Time 13.4  11.6 - 15.2 seconds   INR 1.04  0.00 - 1.49  URINALYSIS, ROUTINE W REFLEX MICROSCOPIC     Status: Abnormal   Collection Time    07/10/13  2:53 AM      Result Value Range   Color, Urine YELLOW  YELLOW   APPearance CLEAR  CLEAR   Specific Gravity, Urine 1.020  1.005 - 1.030   pH 7.0  5.0 - 8.0   Glucose, UA NEGATIVE  NEGATIVE mg/dL   Hgb urine dipstick SMALL (*) NEGATIVE   Bilirubin Urine NEGATIVE  NEGATIVE   Ketones, ur NEGATIVE  NEGATIVE mg/dL   Protein, ur >300 (*) NEGATIVE mg/dL   Urobilinogen, UA 1.0  0.0 - 1.0 mg/dL   Nitrite NEGATIVE  NEGATIVE   Leukocytes, UA NEGATIVE  NEGATIVE  URINE MICROSCOPIC-ADD ON     Status: Abnormal   Collection Time    07/10/13  2:53 AM      Result Value Range   Squamous Epithelial / LPF RARE  RARE   WBC, UA 0-2  <3 WBC/hpf   RBC / HPF 0-2  <3 RBC/hpf   Bacteria, UA FEW (*) RARE    Radiological Exams on Admission: Dg Chest Portable 1 View  07/10/2013   CLINICAL DATA:  Hypertension and headache.  EXAM: PORTABLE CHEST - 1 VIEW  COMPARISON:  11/24/2012  FINDINGS: Shallow inspiration. Heart size and pulmonary vascularity are normal. No focal airspace disease or consolidation in the lungs. No blunting of costophrenic angles. No pneumothorax. No significant change since prior study.  IMPRESSION: No evidence of active pulmonary disease.   Electronically Signed   By: Lucienne Capers M.D.   On: 07/10/2013 03:54    Assessment/Plan Principal Problem:   Hypertensive urgency Active Problems:   DIABETES MELLITUS, TYPE II CKD  57 year old male with a history of hypertension, C. KD, diabetes mellitus now being admitted with hypertensive urgency, patient has been  started on labetalol drip in the ED will continue the labetalol GTT .patient will be admitted to the step down unit. Continue the home medications except Coreg. We'll also get nephrology consultation the morning for further adjustment of the medications. We'll start sliding scale insulin for diabetes mellitus.  Code status:Presumed full code  Family discussion:No family at bedside, discussed with patient in detail   Time Spent on Admission: 75 min  [facility] Pager: [phone] 07/10/2013, 4:23 AM  If 7PM-7AM, please contact night-coverage  [email or web address]  Password [email or web address]

## 2013-07-10 NOTE — ED Notes (Signed)
I have a headache and my blood pressure was over 200 per pt.

## 2013-07-10 NOTE — Progress Notes (Signed)
H and P from this AM reviewed. 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 Currently asymptomatic. Nephrology consulted. For now, pt remains on labetalol gtt. Will follow

## 2013-07-10 NOTE — ED Notes (Signed)
Patient walked to restroom. Obtained urine specimen. BP highly elevated.

## 2013-07-10 NOTE — ED Provider Notes (Signed)
CSN: [id number]     Arrival date & time 07/10/13  0033 History   First MD Initiated Contact with Patient 07/10/13 0147     Chief Complaint  Patient presents with  . Hypertension  . Headache   (Consider location/radiation/quality/duration/timing/severity/associated sxs/prior Treatment) HPI History provided by patient. Has history of hypertension, diabetes, previous stroke. Has noticed elevated blood pressure over the last 2 days despite taking all medications as prescribed. Primary care physician changed his medications a few weeks ago.  He has had some headaches but denies any headache at this time. No weakness or numbness. No difficulty with speech, gait or vision. No chest pain or shortness of breath. No change in urination. Patient did have kidney biopsy a few weeks ago and told he has kidney disease. The patient became concerned tonight when his systolic blood pressure was over 200.  Past Medical History  Diagnosis Date  . Dermatitis   . CHF (congestive heart failure)     LV function improved from 2004 to 2008.  Historically, moderately dilated LV with EF 30-40% by 2D echo 08/14/2002.  Mild CAD with severe LV dysfunction by cardiac cath 09/2002.  Normal coronary arteries and normal LV function by cardiac cath 09/19/2006.  A 2-D echo on 04/01/2009 showed mild concentric hypertrophy and normal systolic (LVEF  123456) and doppler C/W with grade 1 diastolic dysfunction.  . Hypertension   . Diabetes mellitus   . Hyperlipidemia   . Hearing loss in right ear   . Cardiomyopathy     LV function improved from 2004 to 2008.  Historically, moderately dilated LV with EF 30-40% by 2D echo 08/14/2002.  Mild CAD with severe LV dysfunction by cardiac cath 09/2002.  Normal coronary arteries and normal LV function by cardiac cath 09/19/2006.  A 2-D echo on 04/01/2009 showed mild concentric hypertrophy and normal systolic (LVEF  123456) and doppler C/W with grade 1 diastolic dysfunction.  . DM neuropathy, painful    . CVA (cerebral vascular accident) 07/04/2012    MRI of the brain 07/04/2012 showed an acute infarct in the right basal ganglia involving the anterior putamen, anterior limb internal capsule, and head of the caudate; this measured approximately 2.5 cm in diameter.     . Hypertensive crisis 07/28/2012   Past Surgical History  Procedure Laterality Date  . Colonoscopy    . Polypectomy    . Cardiac catheterization      3 times  . Foot surgery     Family History  Problem Relation Age of Onset  . Colon cancer Sister   . Aneurysm Father 67    died of rupture   History  Substance Use Topics  . Smoking status: Never Smoker   . Smokeless tobacco: Never Used  . Alcohol Use: Yes     Comment: Wine occasionally (no more than 2 glasses per month)    Review of Systems  Constitutional: Negative for fever and chills.  Eyes: Negative for pain.  Respiratory: Negative for shortness of breath.   Cardiovascular: Negative for chest pain.  Gastrointestinal: Negative for abdominal pain.  Genitourinary: Negative for dysuria.  Musculoskeletal: Negative for back pain, neck pain and neck stiffness.  Skin: Negative for rash.  Neurological: Positive for headaches. Negative for speech difficulty, weakness and numbness.  All other systems reviewed and are negative.    Allergies  Lisinopril  Home Medications   Current Outpatient Rx  Name  Route  Sig  Dispense  Refill  . BD PEN NEEDLE NANO U/F 32G  X 4 MM MISC      USE AS DIRECTED FOR DAILY INSULIN INJECTION   100 each   3   . carvedilol (COREG) 25 MG tablet      TAKE 1 TABLET (25 MG TOTAL) BY MOUTH 2 (TWO) TIMES DAILY WITH A MEAL.   180 tablet   2   . cloNIDine (CATAPRES) 0.1 MG tablet      TAKE 3 TABLETS (0.3 MG TOTAL) BY MOUTH 2 (TWO) TIMES DAILY.   540 tablet   3   . clopidogrel (PLAVIX) 75 MG tablet   Oral   Take 1 tablet (75 mg total) by mouth daily.   30 tablet   11   . DIGOX 0.25 MG tablet      TAKE 1 TABLET (250 MCG  TOTAL) BY MOUTH DAILY.   90 tablet   2   . furosemide (LASIX) 40 MG tablet   Oral   Take 2.5 tablets (100 mg total) by mouth every morning.   75 tablet   3   . gabapentin (NEURONTIN) 300 MG capsule      TAKE 1 CAPSULE (300 MG TOTAL) BY MOUTH DAILY.   90 capsule   2   . glucose blood (ONE TOUCH ULTRA TEST) test strip      Use as instructed to check blood sugar twice a day.  ICD-9 250.00, on insulin.   100 each   6   . hydrALAZINE (APRESOLINE) 50 MG tablet   Oral   Take 25 mg by mouth 3 (three) times daily.         . hydrochlorothiazide (HYDRODIURIL) 25 MG tablet   Oral   Take 1 tablet (25 mg total) by mouth daily.   30 tablet   3   . insulin glargine (LANTUS) 100 UNIT/ML injection   Subcutaneous   Inject 15 Units into the skin at bedtime.         . Lancets (ONETOUCH ULTRASOFT) lancets      Use as instructed to check blood sugar two times a day.   100 each   6   . losartan (COZAAR) 50 MG tablet   Oral   Take 1 tablet (50 mg total) by mouth daily.   30 tablet   3   . metFORMIN (GLUCOPHAGE) 1000 MG tablet      TAKE 1 TABLET (1,000 MG TOTAL) BY MOUTH 2 (TWO) TIMES DAILY WITH A MEAL.   180 tablet   2   . Multiple Vitamin (MULITIVITAMIN WITH MINERALS) TABS   Oral   Take 1 tablet by mouth every morning.          . niacin 500 MG CR capsule   Oral   Take 500 mg by mouth at bedtime.         . pravastatin (PRAVACHOL) 40 MG tablet      TAKE 1 TABLET (40 MG TOTAL) BY MOUTH DAILY.   90 tablet   2    BP 217/91  Pulse 68  Temp(Src) 98.3 F (36.8 C) (Oral)  Resp 20  Ht 6' (1.829 m)  Wt 210 lb (95.255 kg)  BMI 28.47 kg/m2  SpO2 98% Physical Exam  Constitutional: He is oriented to person, place, and time. He appears well-developed and well-nourished.  HENT:  Head: Normocephalic and atraumatic.  Eyes: EOM are normal. Pupils are equal, round, and reactive to light.  Neck: Neck supple.  Cardiovascular: Normal rate, regular rhythm and intact distal  pulses.   Hypertensive  Pulmonary/Chest:  Effort normal. No respiratory distress.  Abdominal: Soft. He exhibits no distension. There is no tenderness.  Musculoskeletal: Normal range of motion. He exhibits no tenderness.  Trace pretibial edema  Neurological: He is alert and oriented to person, place, and time. He displays normal reflexes. No cranial nerve deficit. He exhibits normal muscle tone. Coordination normal.  Skin: Skin is warm and dry.    ED Course  Procedures (including critical care time) Labs Review Labs Reviewed  CBC WITH DIFFERENTIAL - Abnormal; Notable for the following:    RBC 3.83 (*)    Hemoglobin 10.8 (*)    HCT 32.0 (*)    All other components within normal limits  BASIC METABOLIC PANEL - Abnormal; Notable for the following:    Potassium 3.4 (*)    Chloride 95 (*)    CO2 33 (*)    Glucose, Bld 218 (*)    BUN 32 (*)    Creatinine, Ser 2.14 (*)    Calcium 8.3 (*)    GFR calc non Af Amer 33 (*)    GFR calc Af Amer 38 (*)    All other components within normal limits  URINALYSIS, ROUTINE W REFLEX MICROSCOPIC - Abnormal; Notable for the following:    Hgb urine dipstick SMALL (*)    Protein, ur >300 (*)    All other components within normal limits  URINE MICROSCOPIC-ADD ON - Abnormal; Notable for the following:    Bacteria, UA FEW (*)    All other components within normal limits  APTT  PROTIME-INR   Imaging Review Dg Chest Portable 1 View  07/10/2013   CLINICAL DATA:  Hypertension and headache.  EXAM: PORTABLE CHEST - 1 VIEW  COMPARISON:  11/24/2012  FINDINGS: Shallow inspiration. Heart size and pulmonary vascularity are normal. No focal airspace disease or consolidation in the lungs. No blunting of costophrenic angles. No pneumothorax. No significant change since prior study.  IMPRESSION: No evidence of active pulmonary disease.   Electronically Signed   By: Lucienne Capers M.D.   On: 07/10/2013 03:54     Date: 07/10/2013  Rate: 68  Rhythm: normal sinus  rhythm  QRS Axis: normal  Intervals: normal  ST/T Wave abnormalities: nonspecific ST/T changes  Conduction Disutrbances:none  Narrative Interpretation:   Old EKG Reviewed: Previous EKG dated 07/03/2012 demonstrates similar ST depressions and T-wave inversions inferior and lateral leads  Patient was given extra clonidine and coreg/ medications without changes blood pressure.  Started on labetalol drip and medicine consult for admission - severe hypertension history of stroke and kidney disease  CRITICAL CARE Performed by: Teressa Lower Total critical care time: 40 Critical care time was exclusive of separately billable procedures and treating other patients. Critical care was necessary to treat or prevent imminent or life-threatening deterioration. Critical care was time spent personally by me on the following activities: development of treatment plan with patient and/or surrogate as well as nursing, discussions with consultants, evaluation of patient's response to treatment, examination of patient, obtaining history from patient or surrogate, ordering and performing treatments and interventions, ordering and review of laboratory studies, ordering and review of radiographic studies, pulse oximetry and re-evaluation of patient's condition.  3:41 AM d/w DR Darrick Meigs, will evaluate for admit.  MDM  Diagnosis: Severe hypertension  EKG, labs, chest x-ray IV labetalol drip Serial evaluations Medical admission   Teressa Lower, MD 07/10/13 [phone]

## 2013-07-11 DIAGNOSIS — E785 Hyperlipidemia, unspecified: Secondary | ICD-10-CM

## 2013-07-11 LAB — BASIC METABOLIC PANEL
CO2: 31 mEq/L (ref 19–32)
Chloride: 102 mEq/L (ref 96–112)
Creatinine, Ser: 1.57 mg/dL — ABNORMAL HIGH (ref 0.50–1.35)
GFR calc Af Amer: 55 mL/min — ABNORMAL LOW (ref >90)
Glucose, Bld: 173 mg/dL — ABNORMAL HIGH (ref 70–99)
Potassium: 3.5 mEq/L (ref 3.5–5.1)
Sodium: 140 mEq/L (ref 135–145)

## 2013-07-11 LAB — GLUCOSE, CAPILLARY
Glucose-Capillary: 165 mg/dL — ABNORMAL HIGH (ref 70–99)
Glucose-Capillary: 205 mg/dL — ABNORMAL HIGH (ref 70–99)
Glucose-Capillary: 175 mg/dL — ABNORMAL HIGH (ref 70–99)

## 2013-07-11 LAB — PROTEIN / CREATININE RATIO, URINE: Total Protein, Urine: 253 mg/dL

## 2013-07-11 MED ORDER — HYDRALAZINE HCL 20 MG/ML IJ SOLN
10.0000 mg | INTRAMUSCULAR | Status: DC | PRN
  Administered 2013-07-11 – 2013-07-13 (×3): 10 mg via INTRAVENOUS
  Filled 2013-07-11 (×3): qty 1

## 2013-07-11 MED ORDER — LABETALOL HCL 200 MG PO TABS
200.0000 mg | ORAL_TABLET | Freq: Two times a day (BID) | ORAL | Status: DC
  Administered 2013-07-11 (×2): 200 mg via ORAL
  Filled 2013-07-11 (×2): qty 1

## 2013-07-11 MED ORDER — LOSARTAN POTASSIUM 50 MG PO TABS
100.0000 mg | ORAL_TABLET | Freq: Every day | ORAL | Status: DC
  Administered 2013-07-11 – 2013-07-13 (×3): 100 mg via ORAL
  Filled 2013-07-11 (×3): qty 2

## 2013-07-11 NOTE — Consult Note (Signed)
Reason for Consult: Hypertension and renal failure Referring Physician: Dr. Alesia Banda is an 57 y.o. male.  HPI: He is a patient who has long-standing history of diabetes, hypertension and nephrotic syndrome presently came because of headache and uncontrolled hypertension. According to the patient his antihypertensive medication was changed by his primary care physician. Patient claims to be compliant with his medications. The last 2 weeks his blood pressure is high and also has stopped having recurrent headache. Presently patient denies any headache. Overall he said he's feeling much better. Patient also denies any difficulty breathing.  Past Medical History  Diagnosis Date  . Dermatitis   . CHF (congestive heart failure)     LV function improved from 2004 to 2008.  Historically, moderately dilated LV with EF 30-40% by 2D echo 08/14/2002.  Mild CAD with severe LV dysfunction by cardiac cath 09/2002.  Normal coronary arteries and normal LV function by cardiac cath 09/19/2006.  A 2-D echo on 04/01/2009 showed mild concentric hypertrophy and normal systolic (LVEF  123456) and doppler C/W with grade 1 diastolic dysfunction.  . Hypertension   . Diabetes mellitus   . Hyperlipidemia   . Hearing loss in right ear   . Cardiomyopathy     LV function improved from 2004 to 2008.  Historically, moderately dilated LV with EF 30-40% by 2D echo 08/14/2002.  Mild CAD with severe LV dysfunction by cardiac cath 09/2002.  Normal coronary arteries and normal LV function by cardiac cath 09/19/2006.  A 2-D echo on 04/01/2009 showed mild concentric hypertrophy and normal systolic (LVEF  123456) and doppler C/W with grade 1 diastolic dysfunction.  . DM neuropathy, painful   . CVA (cerebral vascular accident) 07/04/2012    MRI of the brain 07/04/2012 showed an acute infarct in the right basal ganglia involving the anterior putamen, anterior limb internal capsule, and head of the caudate; this measured approximately  2.5 cm in diameter.     . Hypertensive crisis 07/28/2012    Past Surgical History  Procedure Laterality Date  . Colonoscopy    . Polypectomy    . Cardiac catheterization      3 times  . Foot surgery      Family History  Problem Relation Age of Onset  . Colon cancer Sister   . Aneurysm Father 58    died of rupture    Social History:  reports that he has never smoked. He has never used smokeless tobacco. He reports that he drinks alcohol. He reports that he does not use illicit drugs.  Allergies:  Allergies  Allergen Reactions  . Amlodipine Swelling  . Lisinopril Cough    Medications: I have reviewed the patient's current medications.  Results for orders placed during the hospital encounter of 07/10/13 (from the past 48 hour(s))  CBC WITH DIFFERENTIAL     Status: Abnormal   Collection Time    07/10/13  1:34 AM      Result Value Range   WBC 4.8  4.0 - 10.5 K/uL   RBC 3.83 (*) 4.22 - 5.81 MIL/uL   Hemoglobin 10.8 (*) 13.0 - 17.0 g/dL   HCT 32.0 (*) 39.0 - 52.0 %   MCV 83.6  78.0 - 100.0 fL   MCH 28.2  26.0 - 34.0 pg   MCHC 33.8  30.0 - 36.0 g/dL   RDW 13.0  11.5 - 15.5 %   Platelets 300  150 - 400 K/uL   Neutrophils Relative % 58  43 - 77 %  Neutro Abs 2.8  1.7 - 7.7 K/uL   Lymphocytes Relative 28  12 - 46 %   Lymphs Abs 1.3  0.7 - 4.0 K/uL   Monocytes Relative 9  3 - 12 %   Monocytes Absolute 0.4  0.1 - 1.0 K/uL   Eosinophils Relative 4  0 - 5 %   Eosinophils Absolute 0.2  0.0 - 0.7 K/uL   Basophils Relative 1  0 - 1 %   Basophils Absolute 0.0  0.0 - 0.1 K/uL  BASIC METABOLIC PANEL     Status: Abnormal   Collection Time    07/10/13  1:34 AM      Result Value Range   Sodium 136  135 - 145 mEq/L   Potassium 3.4 (*) 3.5 - 5.1 mEq/L   Chloride 95 (*) 96 - 112 mEq/L   CO2 33 (*) 19 - 32 mEq/L   Glucose, Bld 218 (*) 70 - 99 mg/dL   BUN 32 (*) 6 - 23 mg/dL   Creatinine, Ser 2.14 (*) 0.50 - 1.35 mg/dL   Calcium 8.3 (*) 8.4 - 10.5 mg/dL   GFR calc non Af Amer  33 (*) >90 mL/min   GFR calc Af Amer 38 (*) >90 mL/min   Comment: (NOTE)     The eGFR has been calculated using the CKD EPI equation.     This calculation has not been validated in all clinical situations.     eGFR's persistently <90 mL/min signify possible Chronic Kidney     Disease.  APTT     Status: None   Collection Time    07/10/13  1:34 AM      Result Value Range   aPTT 31  24 - 37 seconds  PROTIME-INR     Status: None   Collection Time    07/10/13  1:34 AM      Result Value Range   Prothrombin Time 13.4  11.6 - 15.2 seconds   INR 1.04  0.00 - 1.49  URINALYSIS, ROUTINE W REFLEX MICROSCOPIC     Status: Abnormal   Collection Time    07/10/13  2:53 AM      Result Value Range   Color, Urine YELLOW  YELLOW   APPearance CLEAR  CLEAR   Specific Gravity, Urine 1.020  1.005 - 1.030   pH 7.0  5.0 - 8.0   Glucose, UA NEGATIVE  NEGATIVE mg/dL   Hgb urine dipstick SMALL (*) NEGATIVE   Bilirubin Urine NEGATIVE  NEGATIVE   Ketones, ur NEGATIVE  NEGATIVE mg/dL   Protein, ur >300 (*) NEGATIVE mg/dL   Urobilinogen, UA 1.0  0.0 - 1.0 mg/dL   Nitrite NEGATIVE  NEGATIVE   Leukocytes, UA NEGATIVE  NEGATIVE  URINE MICROSCOPIC-ADD ON     Status: Abnormal   Collection Time    07/10/13  2:53 AM      Result Value Range   Squamous Epithelial / LPF RARE  RARE   WBC, UA 0-2  <3 WBC/hpf   RBC / HPF 0-2  <3 RBC/hpf   Bacteria, UA FEW (*) RARE  MRSA PCR SCREENING     Status: None   Collection Time    07/10/13  5:02 AM      Result Value Range   MRSA by PCR NEGATIVE  NEGATIVE   Comment:            The GeneXpert MRSA Assay (FDA     approved for NASAL specimens     only), is one  component of a     comprehensive MRSA colonization     surveillance program. It is not     intended to diagnose MRSA     infection nor to guide or     monitor treatment for     MRSA infections.  CBC     Status: Abnormal   Collection Time    07/10/13  5:54 AM      Result Value Range   WBC 5.9  4.0 - 10.5 K/uL    RBC 3.83 (*) 4.22 - 5.81 MIL/uL   Hemoglobin 10.6 (*) 13.0 - 17.0 g/dL   HCT 32.0 (*) 39.0 - 52.0 %   MCV 83.6  78.0 - 100.0 fL   MCH 27.7  26.0 - 34.0 pg   MCHC 33.1  30.0 - 36.0 g/dL   RDW 13.1  11.5 - 15.5 %   Platelets 301  150 - 400 K/uL  COMPREHENSIVE METABOLIC PANEL     Status: Abnormal   Collection Time    07/10/13  5:54 AM      Result Value Range   Sodium 140  135 - 145 mEq/L   Potassium 3.3 (*) 3.5 - 5.1 mEq/L   Chloride 99  96 - 112 mEq/L   CO2 32  19 - 32 mEq/L   Glucose, Bld 135 (*) 70 - 99 mg/dL   BUN 32 (*) 6 - 23 mg/dL   Creatinine, Ser 1.91 (*) 0.50 - 1.35 mg/dL   Calcium 8.2 (*) 8.4 - 10.5 mg/dL   Total Protein 6.1  6.0 - 8.3 g/dL   Albumin 2.7 (*) 3.5 - 5.2 g/dL   AST 18  0 - 37 U/L   ALT 11  0 - 53 U/L   Alkaline Phosphatase 55  39 - 117 U/L   Total Bilirubin 0.3  0.3 - 1.2 mg/dL   GFR calc non Af Amer 38 (*) >90 mL/min   GFR calc Af Amer 44 (*) >90 mL/min   Comment: (NOTE)     The eGFR has been calculated using the CKD EPI equation.     This calculation has not been validated in all clinical situations.     eGFR's persistently <90 mL/min signify possible Chronic Kidney     Disease.  GLUCOSE, CAPILLARY     Status: Abnormal   Collection Time    07/10/13  8:23 AM      Result Value Range   Glucose-Capillary 144 (*) 70 - 99 mg/dL   Comment 1 Documented in Chart     Comment 2 Notify RN    GLUCOSE, CAPILLARY     Status: Abnormal   Collection Time    07/10/13 11:55 AM      Result Value Range   Glucose-Capillary 168 (*) 70 - 99 mg/dL   Comment 1 Documented in Chart     Comment 2 Notify RN    GLUCOSE, CAPILLARY     Status: Abnormal   Collection Time    07/10/13  4:45 PM      Result Value Range   Glucose-Capillary 161 (*) 70 - 99 mg/dL   Comment 1 Documented in Chart     Comment 2 Notify RN    GLUCOSE, CAPILLARY     Status: Abnormal   Collection Time    07/10/13  9:21 PM      Result Value Range   Glucose-Capillary 161 (*) 70 - 99 mg/dL   Comment 1  Documented in Chart     Comment 2  Notify RN    BASIC METABOLIC PANEL     Status: Abnormal   Collection Time    07/11/13  4:05 AM      Result Value Range   Sodium 140  135 - 145 mEq/L   Potassium 3.5  3.5 - 5.1 mEq/L   Chloride 102  96 - 112 mEq/L   CO2 31  19 - 32 mEq/L   Glucose, Bld 173 (*) 70 - 99 mg/dL   BUN 23  6 - 23 mg/dL   Creatinine, Ser 1.57 (*) 0.50 - 1.35 mg/dL   Calcium 8.4  8.4 - 10.5 mg/dL   GFR calc non Af Amer 48 (*) >90 mL/min   GFR calc Af Amer 55 (*) >90 mL/min   Comment: (NOTE)     The eGFR has been calculated using the CKD EPI equation.     This calculation has not been validated in all clinical situations.     eGFR's persistently <90 mL/min signify possible Chronic Kidney     Disease.  GLUCOSE, CAPILLARY     Status: Abnormal   Collection Time    07/11/13  7:30 AM      Result Value Range   Glucose-Capillary 165 (*) 70 - 99 mg/dL   Comment 1 Documented in Chart     Comment 2 Notify RN      Dg Chest Portable 1 View  07/10/2013   CLINICAL DATA:  Hypertension and headache.  EXAM: PORTABLE CHEST - 1 VIEW  COMPARISON:  11/24/2012  FINDINGS: Shallow inspiration. Heart size and pulmonary vascularity are normal. No focal airspace disease or consolidation in the lungs. No blunting of costophrenic angles. No pneumothorax. No significant change since prior study.  IMPRESSION: No evidence of active pulmonary disease.   Electronically Signed   By: Lucienne Capers M.D.   On: 07/10/2013 03:54    Review of Systems  Constitutional: Negative for chills and weight loss.  Cardiovascular: Negative for chest pain, orthopnea and leg swelling.  Gastrointestinal: Negative for heartburn and nausea.  Neurological: Positive for headaches.   Blood pressure 176/87, pulse 75, temperature 97.9 F (36.6 C), temperature source Oral, resp. rate 24, height 6' (1.829 m), weight 97.5 kg (214 lb 15.2 oz), SpO2 98.00%. Physical Exam  Constitutional: He is oriented to person, place, and  time. No distress.  Eyes: No scleral icterus.  Neck: No JVD present.  Cardiovascular: Normal rate and regular rhythm.   No murmur heard. Respiratory: Effort normal and breath sounds normal. He has no wheezes.  GI: He exhibits no distension. There is no tenderness.  Musculoskeletal: He exhibits no edema.  Neurological: He is alert and oriented to person, place, and time.    Assessment/Plan: Problem #1  renal failure: Chronic. 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 His BUN and creatinine is slightly better today. Problem #2 hypertension his blood pressure is reasonably controlled today. Problem #3 nephrotic syndrome. This is secondary to diabetic nephropathy. Problem #4 history of diabetes Problem #5 history of cardiomyopathy Problem #6 history of CVA Problem #7 obstructive sleep apnea Problem #8 anemia. His hemoglobin hematocrit is low. Most likely a compression of iron deficiency and anemia chronic disease. 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 Problem #10 hypokalemia most likely secondary to diuretics. Plan: We'll increase Cozaar to 100 mg by mouth once a day           Will start patient on labetalol 200 mg by mouth twice a day  Check his basic metabolic panel, CBC, iron studies and phosphorus in the morning.           We'll DC IV labetalol if his systolic blood pressure is below 170.  Chaddrick Brue S 07/11/2013, 8:23 AM

## 2013-07-11 NOTE — Progress Notes (Signed)
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 - BP now much improved with labetalol gtt - Nephrology following, recs for transition to PO bp meds - Consider transfer out of ICU if/when pt remains well off labetalol gtt  DIABETES MELLITUS, TYPE II  - On SSI - Glucose stable  CKD - Stable - Follow renal fx - Nephrology following  Code Status: Full Family Communication: Pt in room (indicate person spoken with, relationship, and if by phone, the number) Disposition Plan: Pending   Consultants:  Nephrology  HPI/Subjective: No acute events noted overnight  Objective: Filed Vitals:   07/11/13 0530 07/11/13 0600 07/11/13 0630 07/11/13 0739  BP: 160/81 152/89 178/90 176/87  Pulse:      Temp:      TempSrc:      Resp: 26  24   Height:      Weight:      SpO2:        Intake/Output Summary (Last 24 hours) at 07/11/13 0829 Last data filed at 07/11/13 0648  Gross per 24 hour  Intake 2219.51 ml  Output   2550 ml  Net -330.49 ml   Filed Weights   07/10/13 0050 07/10/13 0505  Weight: 95.255 kg (210 lb) 97.5 kg (214 lb 15.2 oz)    Exam:   General:  Awake, in nad  Cardiovascular: regular, s1, s2  Respiratory: normal resp effort, no wheezing  Abdomen: soft, nondistended  Musculoskeletal: perfused, no clubbing   Data Reviewed: Basic Metabolic Panel:  Recent Labs Lab 07/10/13 0134 07/10/13 0554 07/11/13 0405  NA 136 140 140  K 3.4* 3.3* 3.5  CL 95* 99 102  CO2 33* 32 31  GLUCOSE 218* 135* 173*  BUN 32* 32* 23  CREATININE 2.14* 1.91* 1.57*  CALCIUM 8.3* 8.2* 8.4   Liver Function Tests:  Recent Labs Lab 07/10/13 0554  AST 18  ALT 11  ALKPHOS 55  BILITOT 0.3  PROT 6.1  ALBUMIN 2.7*   No results found for this basename: LIPASE, AMYLASE,  in the last 168 hours No results found for this basename: AMMONIA,   in the last 168 hours CBC:  Recent Labs Lab 07/10/13 0134 07/10/13 0554  WBC 4.8 5.9  NEUTROABS 2.8  --   HGB 10.8* 10.6*  HCT 32.0* 32.0*  MCV 83.6 83.6  PLT 300 301   Cardiac Enzymes: No results found for this basename: CKTOTAL, CKMB, CKMBINDEX, TROPONINI,  in the last 168 hours BNP (last 3 results) No results found for this basename: PROBNP,  in the last 8760 hours CBG:  Recent Labs Lab 07/10/13 0823 07/10/13 1155 07/10/13 1645 07/10/13 2121 07/11/13 0730  GLUCAP 144* 168* 161* 161* 165*    Recent Results (from the past 240 hour(s))  MRSA PCR SCREENING     Status: None   Collection Time    07/10/13  5:02 AM      Result Value Range Status   MRSA by PCR NEGATIVE  NEGATIVE Final   Comment:            The GeneXpert MRSA Assay (FDA     approved for NASAL specimens     only), is one component of a     comprehensive MRSA colonization     surveillance program. It is not     intended to diagnose MRSA     infection nor to guide or     monitor  treatment for     MRSA infections.     Studies: Dg Chest Portable 1 View  07/10/2013   CLINICAL DATA:  Hypertension and headache.  EXAM: PORTABLE CHEST - 1 VIEW  COMPARISON:  11/24/2012  FINDINGS: Shallow inspiration. Heart size and pulmonary vascularity are normal. No focal airspace disease or consolidation in the lungs. No blunting of costophrenic angles. No pneumothorax. No significant change since prior study.  IMPRESSION: No evidence of active pulmonary disease.   Electronically Signed   By: Lucienne Capers M.D.   On: 07/10/2013 03:54    Scheduled Meds: . cloNIDine  0.3 mg Oral BID  . clopidogrel  75 mg Oral QAC breakfast  . digoxin  0.25 mg Oral Daily  . gabapentin  300 mg Oral QHS  . hydrALAZINE  25 mg Oral TID  . hydrochlorothiazide  25 mg Oral Daily  . insulin aspart  0-9 Units Subcutaneous TID WC  . insulin glargine  15 Units Subcutaneous QHS  . losartan  50 mg Oral Daily  . potassium chloride  40 mEq Oral BID   . simvastatin  20 mg Oral q1800  . sodium chloride  3 mL Intravenous Q12H   Continuous Infusions: . labetalol (NORMODYNE) infusion 2 mg/min (07/11/13 [id number])    Principal Problem:   Hypertensive urgency Active Problems:   DIABETES MELLITUS, TYPE II  Time spent: 70min  Charles Hart, [facility] Pager [phone]. If 7PM-7AM, please contact night-coverage at [email or web address], password [email or web address] 07/11/2013, 8:29 AM  LOS: 1 day

## 2013-07-12 DIAGNOSIS — I6350 Cerebral infarction due to unspecified occlusion or stenosis of unspecified cerebral artery: Secondary | ICD-10-CM

## 2013-07-12 LAB — GLUCOSE, CAPILLARY
Glucose-Capillary: 134 mg/dL — ABNORMAL HIGH (ref 70–99)
Glucose-Capillary: 249 mg/dL — ABNORMAL HIGH (ref 70–99)

## 2013-07-12 LAB — IRON AND TIBC
Saturation Ratios: 9 % — ABNORMAL LOW (ref 20–55)
TIBC: 256 ug/dL (ref 215–435)
UIBC: 232 ug/dL (ref 125–400)

## 2013-07-12 LAB — BASIC METABOLIC PANEL
CO2: 30 mEq/L (ref 19–32)
Calcium: 8.7 mg/dL (ref 8.4–10.5)
GFR calc Af Amer: 53 mL/min — ABNORMAL LOW (ref >90)
GFR calc non Af Amer: 46 mL/min — ABNORMAL LOW (ref >90)
Glucose, Bld: 157 mg/dL — ABNORMAL HIGH (ref 70–99)
Potassium: 4.2 mEq/L (ref 3.5–5.1)
Sodium: 140 mEq/L (ref 135–145)

## 2013-07-12 LAB — CBC
Hemoglobin: 11.5 g/dL — ABNORMAL LOW (ref 13.0–17.0)
MCH: 28 pg (ref 26.0–34.0)
MCHC: 32.6 g/dL (ref 30.0–36.0)
RDW: 13.6 % (ref 11.5–15.5)

## 2013-07-12 LAB — PHOSPHORUS: Phosphorus: 3.6 mg/dL (ref 2.3–4.6)

## 2013-07-12 MED ORDER — LABETALOL HCL 200 MG PO TABS
300.0000 mg | ORAL_TABLET | Freq: Two times a day (BID) | ORAL | Status: DC
  Administered 2013-07-12 – 2013-07-13 (×3): 300 mg via ORAL
  Filled 2013-07-12 (×3): qty 2

## 2013-07-12 NOTE — Progress Notes (Signed)
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 - BP stable, albeit elevated off labetalol gtt - Nephrology following, recs for titrating up PO labetalol - Cont with IV PRN bp meds  DIABETES MELLITUS, TYPE II  - On SSI - Glucose stable  CKD - Stable - Follow renal fx - Nephrology following  Code Status: Full Family Communication: Pt in room (indicate person spoken with, relationship, and if by phone, the number) Disposition Plan: Pending   Consultants:  Nephrology  HPI/Subjective: No acute events noted overnight  Objective: Filed Vitals:   07/11/13 1458 07/11/13 1634 07/11/13 2246 07/12/13 0520  BP: 205/88 181/77 193/84 174/76  Pulse: 81 74 74 76  Temp: 97.7 F (36.5 C)  98.1 F (36.7 C) 98.2 F (36.8 C)  TempSrc:    Oral  Resp: 22 22 22 22   Height:      Weight:      SpO2: 99% 100% 100% 100%    Intake/Output Summary (Last 24 hours) at 07/12/13 1243 Last data filed at 07/12/13 0854  Gross per 24 hour  Intake    240 ml  Output   1300 ml  Net  -1060 ml   Filed Weights   07/10/13 0050 07/10/13 0505  Weight: 95.255 kg (210 lb) 97.5 kg (214 lb 15.2 oz)    Exam:   General:  Awake, in nad  Cardiovascular: regular, s1, s2  Respiratory: normal resp effort, no wheezing  Abdomen: soft, nondistended  Musculoskeletal: perfused, no clubbing   Data Reviewed: Basic Metabolic Panel:  Recent Labs Lab 07/10/13 0134 07/10/13 0554 07/11/13 0405 07/12/13 0522  NA 136 140 140 140  K 3.4* 3.3* 3.5 4.2  CL 95* 99 102 103  CO2 33* 32 31 30  GLUCOSE 218* 135* 173* 157*  BUN 32* 32* 23 16  CREATININE 2.14* 1.91* 1.57* 1.62*  CALCIUM 8.3* 8.2* 8.4 8.7  PHOS  --   --   --  3.6   Liver Function Tests:  Recent Labs Lab 07/10/13 0554  AST 18  ALT 11  ALKPHOS 55  BILITOT 0.3  PROT 6.1  ALBUMIN 2.7*   No  results found for this basename: LIPASE, AMYLASE,  in the last 168 hours No results found for this basename: AMMONIA,  in the last 168 hours CBC:  Recent Labs Lab 07/10/13 0134 07/10/13 0554 07/12/13 0522  WBC 4.8 5.9 7.3  NEUTROABS 2.8  --   --   HGB 10.8* 10.6* 11.5*  HCT 32.0* 32.0* 35.3*  MCV 83.6 83.6 85.9  PLT 300 301 332   Cardiac Enzymes: No results found for this basename: CKTOTAL, CKMB, CKMBINDEX, TROPONINI,  in the last 168 hours BNP (last 3 results) No results found for this basename: PROBNP,  in the last 8760 hours CBG:  Recent Labs Lab 07/11/13 1117 07/11/13 1712 07/11/13 2002 07/12/13 0729 07/12/13 1128  GLUCAP 205* 170* 175* 134* 249*    Recent Results (from the past 240 hour(s))  MRSA PCR SCREENING     Status: None   Collection Time    07/10/13  5:02 AM      Result Value Range Status   MRSA by PCR NEGATIVE  NEGATIVE Final   Comment:            The GeneXpert MRSA Assay (FDA     approved for NASAL specimens     only), is one component of a  comprehensive MRSA colonization     surveillance program. It is not     intended to diagnose MRSA     infection nor to guide or     monitor treatment for     MRSA infections.     Studies: No results found.  Scheduled Meds: . cloNIDine  0.3 mg Oral BID  . clopidogrel  75 mg Oral QAC breakfast  . digoxin  0.25 mg Oral Daily  . gabapentin  300 mg Oral QHS  . hydrALAZINE  25 mg Oral TID  . hydrochlorothiazide  25 mg Oral Daily  . insulin aspart  0-9 Units Subcutaneous TID WC  . insulin glargine  15 Units Subcutaneous QHS  . labetalol  300 mg Oral BID  . losartan  100 mg Oral Daily  . potassium chloride  40 mEq Oral BID  . simvastatin  20 mg Oral q1800  . sodium chloride  3 mL Intravenous Q12H   Continuous Infusions:    Principal Problem:   Hypertensive urgency Active Problems:   DIABETES MELLITUS, TYPE II  Time spent: 40min  Marelyn Rouser, [facility] Pager [phone]. If 7PM-7AM,  please contact night-coverage at [email or web address], password [facility] 07/12/2013, 12:43 PM  LOS: 2 days

## 2013-07-12 NOTE — Progress Notes (Signed)
Patient c/o pain to right arm. Swelling noted. The patient did have labetalol drip through an i.v. In that arm earlier this morning. Contacted pharmacy who recommended applying warm compresses to the site. Contacted hospitalist on call to inform him. He also agreed that heat should be applied. Will continue to monitor.

## 2013-07-12 NOTE — Progress Notes (Signed)
Subjective: Interval History: has no complaint of nausea or vomiting. His headache has improved. Patient denies any dizziness or lightheadedness..  Objective: Vital signs in last 24 hours: Temp:  [97.7 F (36.5 C)-98.2 F (36.8 C)] 98.2 F (36.8 C) (11/28 0520) Pulse Rate:  [74-81] 76 (11/28 0520) Resp:  [22-28] 22 (11/28 0520) BP: (161-205)/(71-88) 174/76 mmHg (11/28 0520) SpO2:  [99 %-100 %] 100 % (11/28 0520) Weight change:   Intake/Output from previous day: 11/27 0701 - 11/28 0700 In: 548 [P.O.:482; I.V.:66] Out: 2150 [Urine:2150] Intake/Output this shift:    General appearance: alert, cooperative and no distress Resp: clear to auscultation bilaterally Cardio: regular rate and rhythm, S1, S2 normal, no murmur, click, rub or gallop GI: soft, non-tender; bowel sounds normal; no masses,  no organomegaly Extremities: extremities normal, atraumatic, no cyanosis or edema  Lab Results:  Recent Labs  07/10/13 0554 07/12/13 0522  WBC 5.9 7.3  HGB 10.6* 11.5*  HCT 32.0* 35.3*  PLT 301 332   BMET:  Recent Labs  07/11/13 0405 07/12/13 0522  NA 140 140  K 3.5 4.2  CL 102 103  CO2 31 30  GLUCOSE 173* 157*  BUN 23 16  CREATININE 1.57* 1.62*  CALCIUM 8.4 8.7   No results found for this basename: PTH,  in the last 72 hours Iron Studies: No results found for this basename: IRON, TIBC, TRANSFERRIN, FERRITIN,  in the last 72 hours  Studies/Results: No results found.  I have reviewed the patient's current medications.  Assessment/Plan: Problem #1 renal failure chronic. His BUN is 16 and creatinine is 1.62. Presently is a stage III and patient doesn't have any uremic sinus symptoms. His potassium is also was in acceptable range. Problem #2 hypertension. His blood pressure is better but still high. Problem #3 nephrotic syndrome. This is thought to be secondary to diabetes is on Cozaar. Problem #4 diabetes Problem #5 cardiomyopathy Problem #6 coronary artery  disease.  Problem #7 anemia. History and hematocrit is low. Presently his iron studies are pending. Problem #8 metabolic bone disease his calcium and phosphorus is was in acceptable range. Plan: We'll increase his labetalol to 300 mg by mouth twice a day.           We'll continue his other medications as before.           Her follow patient when his discharged.    LOS: 2 days   Jakyra Kenealy S 07/12/2013,8:40 AM

## 2013-07-13 DIAGNOSIS — E119 Type 2 diabetes mellitus without complications: Secondary | ICD-10-CM

## 2013-07-13 DIAGNOSIS — I509 Heart failure, unspecified: Secondary | ICD-10-CM

## 2013-07-13 DIAGNOSIS — I10 Essential (primary) hypertension: Secondary | ICD-10-CM

## 2013-07-13 LAB — BASIC METABOLIC PANEL
BUN: 21 mg/dL (ref 6–23)
Chloride: 101 mEq/L (ref 96–112)
Creatinine, Ser: 1.67 mg/dL — ABNORMAL HIGH (ref 0.50–1.35)
Glucose, Bld: 179 mg/dL — ABNORMAL HIGH (ref 70–99)
Potassium: 4.4 mEq/L (ref 3.5–5.1)

## 2013-07-13 LAB — GLUCOSE, CAPILLARY
Glucose-Capillary: 150 mg/dL — ABNORMAL HIGH (ref 70–99)
Glucose-Capillary: 215 mg/dL — ABNORMAL HIGH (ref 70–99)

## 2013-07-13 MED ORDER — POTASSIUM CHLORIDE CRYS ER 20 MEQ PO TBCR: 40.0000 meq | EXTENDED_RELEASE_TABLET | Freq: Two times a day (BID) | ORAL | Status: DC

## 2013-07-13 MED ORDER — LABETALOL HCL 300 MG PO TABS: 300.0000 mg | ORAL_TABLET | Freq: Two times a day (BID) | ORAL | Status: DC

## 2013-07-13 MED ORDER — POLYSACCHARIDE IRON COMPLEX 150 MG PO CAPS
150.0000 mg | ORAL_CAPSULE | Freq: Two times a day (BID) | ORAL | Status: DC
  Administered 2013-07-13: 150 mg via ORAL
  Filled 2013-07-13: qty 1

## 2013-07-13 MED ORDER — POLYSACCHARIDE IRON COMPLEX 150 MG PO CAPS: 150.0000 mg | ORAL_CAPSULE | Freq: Two times a day (BID) | ORAL | Status: DC

## 2013-07-13 NOTE — Discharge Summary (Signed)
Physician Discharge Summary  Charles Hart [id number] DOB: 10-Jan-1956 DOA: 07/10/2013  PCP: Axel Filler, MD  Admit date: 07/10/2013 Discharge date: 07/13/2013  Time spent: 35 minutes  Recommendations for Outpatient Follow-up:  1. Follow up with PCP in 1-2 weeks 2. Follow up with Dr. Lowanda Foster as scheduled  Discharge Diagnoses:  Principal Problem:   Hypertensive urgency Active Problems:   DIABETES MELLITUS, TYPE II   Discharge Condition: Improved  Diet recommendation: Diabetic  Filed Weights   07/10/13 0050 07/10/13 0505 07/13/13 0625  Weight: 95.255 kg (210 lb) 97.5 kg (214 lb 15.2 oz) 98.6 kg (217 lb 6 oz)    History of present illness:  57 year old male with a history of hypertension, diverticulitis, congestive heart failure, CAD, CVA came to the hospital with headache patient has a history of hypertension and a shunt has noticed that his blood pressure has been elevated over the past 2 days despite taking all the medications as prescribed. As per patient his medications were changed by his primary care physician about 2 weeks ago. And since then the blood pressure has been difficult to control. Patient did have a kidney biopsy a few weeks ago. In the ED patient continued to have systolic blood pressure greater than 200. He has been started on labetalol drip. Patient denies chest pain no shortness of breath no nausea vomiting or diarrhea.  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  DIABETES MELLITUS, TYPE II  - On SSI  - Glucose stable  -To resume home meds on discharge  CKD  - Stable  -  Follow renal fx  - Nephrology following  Consultations:  nephrology  Discharge Exam: Filed Vitals:   07/13/13 0220 07/13/13 0516 07/13/13 0625 07/13/13 1043  BP: 175/80 149/69  147/72  Pulse:  15    Temp:  98.3 F (36.8 C)    TempSrc:  Oral    Resp:  20    Height:      Weight:   98.6 kg (217 lb 6 oz)   SpO2:  96%      General: Awake, in nad Cardiovascular: regular, s1, s2 Respiratory: normal resp effort, no wheezing  Discharge Instructions     Medication List         BD PEN NEEDLE NANO U/F 32G X 4 MM Misc  Generic drug:  Insulin Pen Needle  USE AS DIRECTED FOR DAILY INSULIN INJECTION     carvedilol 25 MG tablet  Commonly known as:  COREG  TAKE 1 TABLET (25 MG TOTAL) BY MOUTH 2 (TWO) TIMES DAILY WITH A MEAL.     cloNIDine 0.1 MG tablet  Commonly known as:  CATAPRES  TAKE 3 TABLETS (0.3 MG TOTAL) BY MOUTH 2 (TWO) TIMES DAILY.     clopidogrel 75 MG tablet  Commonly known as:  PLAVIX  Take 1 tablet (75 mg total) by mouth daily.     DIGOX 0.25 MG tablet  Generic drug:  digoxin  TAKE 1 TABLET (250 MCG TOTAL) BY MOUTH DAILY.     fluticasone 50 MCG/ACT nasal spray  Commonly known as:  FLONASE  Place 2 sprays into both nostrils daily.  furosemide 40 MG tablet  Commonly known as:  LASIX  Take 2.5 tablets (100 mg total) by mouth every morning.     gabapentin 300 MG capsule  Commonly known as:  NEURONTIN  TAKE 1 CAPSULE (300 MG TOTAL) BY MOUTH DAILY.     glucose blood test strip  Commonly known as:  ONE TOUCH ULTRA TEST  Use as instructed to check blood sugar twice a day.  ICD-9 250.00, on insulin.     hydrALAZINE 50 MG tablet  Commonly known as:  APRESOLINE  Take 25 mg by mouth 3 (three) times daily.     hydrochlorothiazide 25 MG tablet  Commonly known as:  HYDRODIURIL  Take 1 tablet (25 mg total) by mouth daily.     insulin glargine 100 UNIT/ML injection  Commonly known as:  LANTUS  Inject 15 Units into the skin at bedtime.     iron  polysaccharides 150 MG capsule  Commonly known as:  NIFEREX  Take 1 capsule (150 mg total) by mouth 2 (two) times daily.     labetalol 300 MG tablet  Commonly known as:  NORMODYNE  Take 1 tablet (300 mg total) by mouth 2 (two) times daily.     losartan 50 MG tablet  Commonly known as:  COZAAR  Take 1 tablet (50 mg total) by mouth daily.     metFORMIN 1000 MG tablet  Commonly known as:  GLUCOPHAGE  TAKE 1 TABLET (1,000 MG TOTAL) BY MOUTH 2 (TWO) TIMES DAILY WITH A MEAL.     multivitamin with minerals Tabs tablet  Take 1 tablet by mouth every morning.     niacin 500 MG CR capsule  Take 500 mg by mouth at bedtime.     onetouch ultrasoft lancets  Use as instructed to check blood sugar two times a day.     potassium chloride SA 20 MEQ tablet  Commonly known as:  K-DUR,KLOR-CON  Take 2 tablets (40 mEq total) by mouth 2 (two) times daily.     pravastatin 40 MG tablet  Commonly known as:  PRAVACHOL  TAKE 1 TABLET (40 MG TOTAL) BY MOUTH DAILY.       Allergies  Allergen Reactions  . Amlodipine Swelling  . Lisinopril Cough   Follow-up Information   Follow up with [facility] S, MD In 4 weeks.   Specialty:  Nephrology   Contact information:   [address] [address] [phone]       Follow up with Axel Filler, MD. Schedule an appointment as soon as possible for a visit in 2 weeks.   Specialty:  Internal Medicine   Contact information:   [place] [address] [phone]        The results of significant diagnostics from this hospitalization (including imaging, microbiology, ancillary and laboratory) are listed below for reference.    Significant Diagnostic Studies: Dg Chest Portable 1 View  07/10/2013   CLINICAL DATA:  Hypertension and headache.  EXAM: PORTABLE CHEST - 1 VIEW  COMPARISON:  11/24/2012  FINDINGS: Shallow inspiration. Heart size and pulmonary vascularity are normal. No focal airspace disease or  consolidation in the lungs. No blunting of costophrenic angles. No pneumothorax. No significant change since prior study.  IMPRESSION: No evidence of active pulmonary disease.   Electronically Signed   By: Lucienne Capers M.D.   On: 07/10/2013 03:54    Microbiology: Recent Results (from the past 240 hour(s))  MRSA PCR SCREENING     Status: None  Collection Time    07/10/13  5:02 AM      Result Value Range Status   MRSA by PCR NEGATIVE  NEGATIVE Final   Comment:            The GeneXpert MRSA Assay (FDA     approved for NASAL specimens     only), is one component of a     comprehensive MRSA colonization     surveillance program. It is not     intended to diagnose MRSA     infection nor to guide or     monitor treatment for     MRSA infections.     Labs: Basic Metabolic Panel:  Recent Labs Lab 07/10/13 0134 07/10/13 0554 07/11/13 0405 07/12/13 0522 07/13/13 0554  NA 136 140 140 140 137  K 3.4* 3.3* 3.5 4.2 4.4  CL 95* 99 102 103 101  CO2 33* 32 31 30 27   GLUCOSE 218* 135* 173* 157* 179*  BUN 32* 32* 23 16 21   CREATININE 2.14* 1.91* 1.57* 1.62* 1.67*  CALCIUM 8.3* 8.2* 8.4 8.7 8.9  PHOS  --   --   --  3.6  --    Liver Function Tests:  Recent Labs Lab 07/10/13 0554  AST 18  ALT 11  ALKPHOS 55  BILITOT 0.3  PROT 6.1  ALBUMIN 2.7*   No results found for this basename: LIPASE, AMYLASE,  in the last 168 hours No results found for this basename: AMMONIA,  in the last 168 hours CBC:  Recent Labs Lab 07/10/13 0134 07/10/13 0554 07/12/13 0522  WBC 4.8 5.9 7.3  NEUTROABS 2.8  --   --   HGB 10.8* 10.6* 11.5*  HCT 32.0* 32.0* 35.3*  MCV 83.6 83.6 85.9  PLT 300 301 332   Cardiac Enzymes: No results found for this basename: CKTOTAL, CKMB, CKMBINDEX, TROPONINI,  in the last 168 hours BNP: BNP (last 3 results) No results found for this basename: PROBNP,  in the last 8760 hours CBG:  Recent Labs Lab 07/12/13 1128 07/12/13 1645 07/12/13 2149 07/13/13 0745  07/13/13 1135  GLUCAP 249* 129* 150* 181* 215*   Signed:  Sonny Anthes K  [facility] 07/13/2013, 11:45 AM

## 2013-07-13 NOTE — Discharge Planning (Signed)
Pt stated he was ready to go home and he had no pain.  Pt's IV and tele were removed. Pt and family were given DC papers and education.  Pt was educated on the effective way and importance to weight daily and also a cardiac diet.  Pt was also told of S/sx that may cause him to call the doctor or return to the hospital.  Pt told of medication changes and also FU appointments needed. Pt also given scripts. Pt will be wheeled to car by RN and family when ready.

## 2013-07-13 NOTE — Progress Notes (Signed)
Subjective: Interval History: has no complaint of nausea or vomiting. Patient offers no complaint. Presently no headache..  Objective: Vital signs in last 24 hours: Temp:  [97.6 F (36.4 C)-98.7 F (37.1 C)] 98.3 F (36.8 C) (11/29 0516) Pulse Rate:  [15-74] 15 (11/29 0516) Resp:  [20] 20 (11/29 0516) BP: (149-195)/(66-89) 149/69 mmHg (11/29 0516) SpO2:  [96 %-99 %] 96 % (11/29 0516) Weight:  [98.6 kg (217 lb 6 oz)] 98.6 kg (217 lb 6 oz) (11/29 [id number]) Weight change:   Intake/Output from previous day: 11/28 0701 - 11/29 0700 In: 1080 [P.O.:1080] Out: -  Intake/Output this shift:    General appearance: alert, cooperative and no distress Resp: clear to auscultation bilaterally Cardio: regular rate and rhythm, S1, S2 normal, no murmur, click, rub or gallop GI: soft, non-tender; bowel sounds normal; no masses,  no organomegaly Extremities: extremities normal, atraumatic, no cyanosis or edema  Lab Results:  Recent Labs  07/12/13 0522  WBC 7.3  HGB 11.5*  HCT 35.3*  PLT 332   BMET:   Recent Labs  07/12/13 0522 07/13/13 0554  NA 140 137  K 4.2 4.4  CL 103 101  CO2 30 27  GLUCOSE 157* 179*  BUN 16 21  CREATININE 1.62* 1.67*  CALCIUM 8.7 8.9   No results found for this basename: PTH,  in the last 72 hours Iron Studies:   Recent Labs  07/12/13 0522  IRON 24*  TIBC 256  FERRITIN 302    Studies/Results: No results found.  I have reviewed the patient's current medications.  Assessment/Plan: Problem #1 renal failure chronic. His BUN is 16 and creatinine is 1.67. Presently is a stage III and patient doesn't have any uremic signs or symptoms. His potassium is also with in acceptable range. Problem #2 hypertension. His blood pressure is better and was in reasonable range. Problem #3 nephrotic syndrome. This is thought to be secondary to diabetes is on Cozaar. Urine protein to creatinine ratio is high but seems to be improving. Problem #4 diabetes Problem #5  cardiomyopathy Problem #6 coronary artery disease.  Problem #7 anemia. History and hematocrit is low. Presently his iron saturation is low but her ferritin is in acceptable range. Hence possibly compression of iron deficiency and unable of chronic disease. Problem #8 metabolic bone disease his calcium and phosphorus is was in acceptable range. Plan: We'll continue with present treatment           We'll start him on nu-iron 150 mg po bid           Her follow patient when his discharged.    LOS: 3 days   Kable Haywood S 07/13/2013,9:18 AM

## 2013-07-15 NOTE — Progress Notes (Signed)
UR chart review completed.  

## 2013-07-17 ENCOUNTER — Other Ambulatory Visit: Payer: Self-pay | Admitting: Internal Medicine

## 2013-07-18 ENCOUNTER — Ambulatory Visit (INDEPENDENT_AMBULATORY_CARE_PROVIDER_SITE_OTHER): Payer: PRIVATE HEALTH INSURANCE | Admitting: Internal Medicine

## 2013-07-18 ENCOUNTER — Encounter: Payer: Self-pay | Admitting: Internal Medicine

## 2013-07-18 VITALS — BP 159/84 | HR 64 | Temp 97.3°F | Ht 72.0 in | Wt 209.9 lb

## 2013-07-18 DIAGNOSIS — E119 Type 2 diabetes mellitus without complications: Secondary | ICD-10-CM

## 2013-07-18 DIAGNOSIS — I10 Essential (primary) hypertension: Secondary | ICD-10-CM

## 2013-07-18 MED ORDER — PRAVASTATIN SODIUM 40 MG PO TABS: ORAL_TABLET | ORAL | Status: DC

## 2013-07-18 MED ORDER — CLOPIDOGREL BISULFATE 75 MG PO TABS: 75.0000 mg | ORAL_TABLET | Freq: Every day | ORAL | Status: DC

## 2013-07-18 MED ORDER — CLONIDINE HCL 0.1 MG PO TABS: 0.1000 mg | ORAL_TABLET | Freq: Two times a day (BID) | ORAL | Status: DC

## 2013-07-18 MED ORDER — HYDROCHLOROTHIAZIDE 25 MG PO TABS: 25.0000 mg | ORAL_TABLET | Freq: Every day | ORAL | Status: DC

## 2013-07-18 MED ORDER — LOSARTAN POTASSIUM 50 MG PO TABS: 50.0000 mg | ORAL_TABLET | Freq: Every day | ORAL | Status: DC

## 2013-07-18 MED ORDER — HYDRALAZINE HCL 50 MG PO TABS: 25.0000 mg | ORAL_TABLET | Freq: Three times a day (TID) | ORAL | Status: DC

## 2013-07-18 MED ORDER — DIGOXIN 250 MCG PO TABS: ORAL_TABLET | ORAL | Status: DC

## 2013-07-18 MED ORDER — CARVEDILOL 25 MG PO TABS: ORAL_TABLET | ORAL | Status: DC

## 2013-07-18 NOTE — Patient Instructions (Signed)
Stop taking the Lasix. Do not begin taking the labetalol. Continue to take the clonidine, hydralazine, Coreg, hydrochlorothiazide, and losartan.  Did not begin taking the potassium or the iron supplements.  Follow up in the clinic in one month for a blood pressure check.

## 2013-07-18 NOTE — Assessment & Plan Note (Signed)
Lab Results  Component Value Date   HGBA1C 6.5 06/19/2013   HGBA1C 6.0 02/06/2013   HGBA1C 6.4 09/19/2012     Assessment: Diabetes control: good control (HgbA1C at goal) Progress toward A1C goal:  at goal Comments: Gross blood sugars look pretty good with an average 162, per his meter readings. He's currently taking Lantus 15 units at night and metformin 1 thousand milligrams twice a day. His last A1c was 6.5 on 06/19/13.  Plan: Medications:  continue current medications Home glucose monitoring: Frequency: once a day Timing: before breakfast Instruction/counseling given: reminded to bring blood glucose meter & log to each visit and reminded to bring medications to each visit Educational resources provided: brochure;handout Self management tools provided: copy of home glucose meter download Other plans: F/u with PCP in 1 month.

## 2013-07-18 NOTE — Assessment & Plan Note (Addendum)
BP Readings from Last 3 Encounters:  07/18/13 159/84  07/13/13 147/72  07/01/13 160/80    Lab Results  Component Value Date   NA 137 07/13/2013   K 4.4 07/13/2013   CREATININE 1.67* 07/13/2013    Assessment: Blood pressure control: moderately elevated Progress toward BP goal:  improved Comments: BP is still elevated, but is improved. He denies any headaches or visual changes, and he states that he is  feeling pretty good overall. Patient states he is not taking the majority of his blood pressure medications, but does endorse taking his clonidine and possibly losartan today. He states he's having difficulty affording multiple 30 days prescriptions for the same medication, but is able to purchase a 90 day prescription for the same cost as for 30 days. He was started on labetalol 300 mg twice a day during his previous hospitalization, was discontinued at as an outpatient in addition to all his other blood pressure medications. He was also supposed his Lasix 40 mg daily, but he states he has been taking that since he has been unable to pick up his other medications from the pharmacy.  Plan: Medications:  He is to stop the Lasix. He is to not take the labetalol. He is to continue the clonidine, hydralazine, coreg, HCTZ, and losartan. Educational resources provided: Therapist, sports tools provided:   Other plans: His followup with his PCP in one month for a blood pressure recheck

## 2013-07-18 NOTE — Progress Notes (Signed)
Patient ID: Charles Hart, male   DOB: May 25, 1956, 57 y.o.   MRN: [name]  Subjective:   Patient ID: Charles Hart male   DOB: April 18, 1956 57 y.o.   MRN: [name]  HPI: Charles Hart is a 57 y.o. M w/ PMH type II diabetes, hypertension, and hyperlipidemia who presents today for a hospital f/u after being admitted with hypertensive urgency.   Pt was admitted on 11/26 to the hospitalist service for hypertensive urgency with an SBP >200. He was started on a labetalol drip in the ED and was admitted for further management. He has been on clonidine, Coreg, HCTZ, hydralazine, and losartan per his PCP, Dr. Marinda Elk. However, per the pt, he has had trouble affording a 1 month supply vs getting a 90 day supply for virtually the same price (he request 90 day refills for his BP meds today). While in the hospital he was started on labetalol 300 mg twice a day, potassium supplementation, and iron supplementation, which he was to continue after his discharge; however he has not picked up his medications from the pharmacy yet. Today he is a bit unclear about which of his blood pressure medications he is actually taking. He states that he thinks he is taking his losartan and his clonidine but not the rest.   Regarding his diabetes, his CBGs are averaging about 162, per his meter results. Is using Lantus 15 units at night and taking metformin 1000 mg twice a day. His last A1c was 6.5 on 06/19/13 which is slightly up from his previous A1c of 6.0 on 02/06/13.  He denies any headaches, changes in vision, chest pain, shortness of breath, abdominal pain, nausea, vomiting, diarrhea, constipation, difficulty urinating, muscle aches, arthralgias, or peripheral edema.  Past Medical History  Diagnosis Date  . Dermatitis   . CHF (congestive heart failure)     LV function improved from 2004 to 2008.  Historically, moderately dilated LV with EF 30-40% by 2D echo 08/14/2002.  Mild CAD with severe LV dysfunction by  cardiac cath 09/2002.  Normal coronary arteries and normal LV function by cardiac cath 09/19/2006.  A 2-D echo on 04/01/2009 showed mild concentric hypertrophy and normal systolic (LVEF  123456) and doppler C/W with grade 1 diastolic dysfunction.  . Hypertension   . Diabetes mellitus   . Hyperlipidemia   . Hearing loss in right ear   . Cardiomyopathy     LV function improved from 2004 to 2008.  Historically, moderately dilated LV with EF 30-40% by 2D echo 08/14/2002.  Mild CAD with severe LV dysfunction by cardiac cath 09/2002.  Normal coronary arteries and normal LV function by cardiac cath 09/19/2006.  A 2-D echo on 04/01/2009 showed mild concentric hypertrophy and normal systolic (LVEF  123456) and doppler C/W with grade 1 diastolic dysfunction.  . DM neuropathy, painful   . CVA (cerebral vascular accident) 07/04/2012    MRI of the brain 07/04/2012 showed an acute infarct in the right basal ganglia involving the anterior putamen, anterior limb internal capsule, and head of the caudate; this measured approximately 2.5 cm in diameter.     . Hypertensive crisis 07/28/2012   Current Outpatient Prescriptions  Medication Sig Dispense Refill  . BD PEN NEEDLE NANO U/F 32G X 4 MM MISC USE AS DIRECTED FOR DAILY INSULIN INJECTION  100 each  3  . cloNIDine (CATAPRES) 0.1 MG tablet TAKE 3 TABLETS (0.3 MG TOTAL) BY MOUTH 2 (TWO) TIMES DAILY.  540 tablet  3  . fluticasone (  FLONASE) 50 MCG/ACT nasal spray Place 2 sprays into both nostrils daily.      [name] gabapentin (NEURONTIN) 300 MG capsule TAKE 1 CAPSULE (300 MG TOTAL) BY MOUTH DAILY.  90 capsule  2  . glucose blood (ONE TOUCH ULTRA TEST) test strip Use as instructed to check blood sugar twice a day.  ICD-9 250.00, on insulin.  100 each  6  . Insulin Glargine (LANTUS SOLOSTAR) 100 UNIT/ML SOPN Inject 15 Units into the skin at bedtime.  15 mL  3  . Lancets (ONETOUCH ULTRASOFT) lancets Use as instructed to check blood sugar two times a day.  100 each  6  . losartan  (COZAAR) 50 MG tablet Take 1 tablet (50 mg total) by mouth daily.  30 tablet  3  . metFORMIN (GLUCOPHAGE) 1000 MG tablet TAKE 1 TABLET (1,000 MG TOTAL) BY MOUTH 2 (TWO) TIMES DAILY WITH A MEAL.  180 tablet  2  . Multiple Vitamin (MULITIVITAMIN WITH MINERALS) TABS Take 1 tablet by mouth every morning.       . niacin 500 MG CR capsule Take 500 mg by mouth at bedtime.      . pravastatin (PRAVACHOL) 40 MG tablet TAKE 1 TABLET (40 MG TOTAL) BY MOUTH DAILY.  90 tablet  2  . carvedilol (COREG) 25 MG tablet TAKE 1 TABLET (25 MG TOTAL) BY MOUTH 2 (TWO) TIMES DAILY WITH A MEAL.  180 tablet  2  . clopidogrel (PLAVIX) 75 MG tablet Take 1 tablet (75 mg total) by mouth daily.  30 tablet  11  . DIGOX 0.25 MG tablet TAKE 1 TABLET (250 MCG TOTAL) BY MOUTH DAILY.  90 tablet  2  . hydrALAZINE (APRESOLINE) 50 MG tablet Take 25 mg by mouth 3 (three) times daily.      . hydrochlorothiazide (HYDRODIURIL) 25 MG tablet Take 1 tablet (25 mg total) by mouth daily.  30 tablet  3  . iron polysaccharides (NIFEREX) 150 MG capsule Take 1 capsule (150 mg total) by mouth 2 (two) times daily.  60 capsule  0  . labetalol (NORMODYNE) 300 MG tablet Take 1 tablet (300 mg total) by mouth 2 (two) times daily.  60 tablet  0  . potassium chloride SA (K-DUR,KLOR-CON) 20 MEQ tablet Take 2 tablets (40 mEq total) by mouth 2 (two) times daily.  60 tablet  0   No current facility-administered medications for this visit.   Family History  Problem Relation Age of Onset  . Colon cancer Sister   . Aneurysm Father 59    died of rupture   History   Social History  . Marital Status: Married    Spouse Name: N/A    Number of Children: N/A  . Years of Education: N/A   Social History Main Topics  . Smoking status: Never Smoker   . Smokeless tobacco: Never Used  . Alcohol Use: Yes     Comment: Wine occasionally (no more than 2 glasses per month)  . Drug Use: No  . Sexual Activity: None   Other Topics Concern  . None   Social History  Narrative  . None   Review of Systems: A 12 point ROS was performed; pertinent positives and negatives were noted in the HPI   Objective:  Physical Exam: Filed Vitals:   07/18/13 1536  BP: 170/84  Pulse: 64  Temp: 97.3 F (36.3 C)  TempSrc: Oral  Height: 6' (1.829 m)  Weight: 209 lb 14.4 oz (95.21 kg)  SpO2: 99%  Constitutional: Vital signs reviewed.  Patient is a well-developed and well-nourished male in no acute distress and cooperative with exam.  Head: Normocephalic and atraumatic Eyes: PERRL, EOMI, conjunctivae normal, No scleral icterus.  Cardiovascular: RRR, no MRG, pulses symmetric and intact bilaterally Pulmonary/Chest: normal respiratory effort, CTAB, no wheezes, rales, or rhonchi Abdominal: Soft. Non-tender, non-distended, bowel sounds are normal, no masses, organomegaly, or guarding present.  Musculoskeletal: No joint deformities, erythema, or stiffness Neurological: A&O x3, cranial nerve II-XII are grossly intact, nonfocal  Psychiatric: Normal mood and affect. speech and behavior is normal.   Assessment & Plan:   Please refer to Problem List based Assessment and Plan

## 2013-07-19 NOTE — Progress Notes (Signed)
Case discussed with Dr. Glenn at the time of the visit. We reviewed the resident's history and exam and pertinent patient test results. I agree with the assessment, diagnosis and plan of care documented in the resident's note. 

## 2013-07-20 ENCOUNTER — Other Ambulatory Visit: Payer: Self-pay | Admitting: Internal Medicine

## 2013-07-22 ENCOUNTER — Other Ambulatory Visit: Payer: Self-pay | Admitting: Internal Medicine

## 2013-07-22 NOTE — Telephone Encounter (Signed)
Patient's serum creatinine was elevated above normal in November, and had improved by the time of his hospital discharge but had not normalized.  His baseline creatinine is typically normal.  He is on metformin 1000 mg twice a day for his diabetes, which I refilled today.  However, given his recently elevated serum creatinine, this does need to be rechecked to make sure that it is appropriate to continue metformin at current dose.  I called patient at home today and spoke with him about this and about his blood pressure control.  He reports feeling well without any acute complaints; he says that he has been out of of two of his blood pressure medications, but he is not sure which ones.  He stopped furosemide last week as instructed at the time of his clinic visit in the outpatient clinic by Dr. Eulas Hart.  He reports no lower extremity edema since he stopped furosemide.  The plan is to have him return next Wednesday afternoon to outpatient clinic for reassessment of his blood pressure control and for recheck of his kidney function.  I have also asked that he be scheduled to see me in my clinic in January.  I advised patient to check his blood pressure at home and to call if he has elevated values.

## 2013-07-23 ENCOUNTER — Other Ambulatory Visit: Payer: Self-pay | Admitting: Internal Medicine

## 2013-07-23 ENCOUNTER — Telehealth: Payer: Self-pay | Admitting: Internal Medicine

## 2013-07-23 MED ORDER — FUROSEMIDE 40 MG PO TABS: 60.0000 mg | ORAL_TABLET | Freq: Every day | ORAL | Status: DC

## 2013-07-23 NOTE — Telephone Encounter (Signed)
I returned a telephone call from patient, who called to report that his weight had increased by 3 pounds since yesterday and he had some swelling in his legs; he denied any shortness of breath.  His recent medication history with regard to his diuretic regimen is not entirely clear; he was apparently on both HCTZ and furosemide when he was hospitalized last month, and at the time of his discharge he was told to continue the HCTZ but stop the furosemide.  However, he says that he continued to take furosemide until his clinic visit last week, when he was told to stop the furosemide.  Since then, his weight has increased from 207 pounds through yesterday to a weight of 210 pounds today.  Given the fact that this happened off of his furosemide, I advised him to resume at a lower dose of furosemide 60 mg daily (take 40 mg tablets 1 & 1/2 tablets daily), and I also advised that he be seen in the clinic this week and that he bring all of his medications with him to the visit for review.  I offered to see him in my clinic tomorrow morning, but he said that he was unable to come to clinic because he has to work.  I will talk with our scheduling staff tomorrow morning to see if we can get patient into clinic this week for a late afternoon appointment.  I advised patient to call if he has worsening of the swelling, SOB, or further increase in his weight.

## 2013-07-31 ENCOUNTER — Encounter: Payer: PRIVATE HEALTH INSURANCE | Admitting: Internal Medicine

## 2013-08-01 ENCOUNTER — Encounter (INDEPENDENT_AMBULATORY_CARE_PROVIDER_SITE_OTHER): Payer: Self-pay

## 2013-08-01 ENCOUNTER — Ambulatory Visit (INDEPENDENT_AMBULATORY_CARE_PROVIDER_SITE_OTHER): Payer: PRIVATE HEALTH INSURANCE | Admitting: Internal Medicine

## 2013-08-01 ENCOUNTER — Encounter: Payer: Self-pay | Admitting: Internal Medicine

## 2013-08-01 VITALS — BP 180/85 | HR 66 | Temp 97.0°F | Wt 213.4 lb

## 2013-08-01 DIAGNOSIS — R609 Edema, unspecified: Secondary | ICD-10-CM

## 2013-08-01 DIAGNOSIS — R600 Localized edema: Secondary | ICD-10-CM

## 2013-08-01 DIAGNOSIS — I509 Heart failure, unspecified: Secondary | ICD-10-CM

## 2013-08-01 DIAGNOSIS — N049 Nephrotic syndrome with unspecified morphologic changes: Secondary | ICD-10-CM

## 2013-08-01 DIAGNOSIS — I10 Essential (primary) hypertension: Secondary | ICD-10-CM

## 2013-08-01 DIAGNOSIS — E119 Type 2 diabetes mellitus without complications: Secondary | ICD-10-CM

## 2013-08-01 LAB — COMPLETE METABOLIC PANEL WITH GFR
ALT: 14 U/L (ref 0–53)
AST: 21 U/L (ref 0–37)
Albumin: 3 g/dL — ABNORMAL LOW (ref 3.5–5.2)
Alkaline Phosphatase: 56 U/L (ref 39–117)
GFR, Est African American: 54 mL/min — ABNORMAL LOW
Glucose, Bld: 106 mg/dL — ABNORMAL HIGH (ref 70–99)
Potassium: 3.9 mEq/L (ref 3.5–5.3)
Sodium: 135 mEq/L (ref 135–145)
Total Bilirubin: 0.2 mg/dL — ABNORMAL LOW (ref 0.3–1.2)
Total Protein: 6.5 g/dL (ref 6.0–8.3)

## 2013-08-01 LAB — GLUCOSE, CAPILLARY: Glucose-Capillary: 90 mg/dL (ref 70–99)

## 2013-08-01 MED ORDER — CLONIDINE HCL 0.1 MG PO TABS: 0.3000 mg | ORAL_TABLET | Freq: Two times a day (BID) | ORAL | Status: DC

## 2013-08-01 NOTE — Assessment & Plan Note (Signed)
Assessment: Last 2-D echocardiogram on 03/20/2013 showed normal LV size with moderate LV hypertrophy, an EF of 55-60%, and moderate diastolic dysfunction.  Patient's leg edema has improved over the past week following resumption of his furosemide.  He has no symptoms of dyspnea either at rest or with exertion, orthopnea, or PND.  Plan: Continue furosemide 60 mg daily; continue current antihypertensive regimen; continue digoxin; check a metabolic panel and digoxin level today.

## 2013-08-01 NOTE — Assessment & Plan Note (Signed)
Assessment: Patient's leg edema is likely multifactorial, with contributions from his nephrotic syndrome and congestive heart failure.  The edema has improved significantly since Lasix was resumed, and also since his amlodipine was previously stopped.  Plan: Patient will continue to monitor his weight at home; this has been stable over the past week.

## 2013-08-01 NOTE — Progress Notes (Signed)
   Subjective:    Patient ID: Charles Hart., male    DOB: [birth year], 57 y.o.   MRN: [id number]  HPI Patient returns for followup of his severe hypertension, congestive heart failure, nephrotic syndrome, lower extremity edema, diabetes, and other chronic medical problems.  He reports improvement in his leg edema since last week, when I advised him by phone to resume furosemide at a dose of 60 mg daily.  He has no acute complaints today.  Patient brought all of his medications with him, so I was able to get an accurate update of his medication list.  He has not seen his nephrologist since he was discharged from [facility] near the end of November.  He has scheduled an appointment in January with a nephrologist at [facility] in [place] for a second opinion regarding his kidney disease.   Review of Systems  Constitutional: Negative for fever and chills.  Respiratory: Negative for cough, shortness of breath and wheezing.   Cardiovascular: Positive for leg swelling (Mild, improved). Negative for chest pain.  Gastrointestinal: Negative for nausea, vomiting and abdominal pain.  Genitourinary: Negative for dysuria and difficulty urinating.  Neurological: Negative for dizziness and syncope.       Objective:   Physical Exam  Constitutional: No distress.  Cardiovascular: Normal rate, regular rhythm and normal heart sounds.  Exam reveals no gallop and no friction rub.   No murmur heard. Trace bilateral leg edema.  Pulmonary/Chest: Effort normal and breath sounds normal. No respiratory distress. He has no wheezes. He has no rales.  Abdominal: Soft. Bowel sounds are normal. He exhibits no distension. There is no tenderness. There is no rebound and no guarding.       Assessment & Plan:

## 2013-08-01 NOTE — Assessment & Plan Note (Signed)
Lab Results  Component Value Date   HGBA1C 6.5 06/19/2013   HGBA1C 6.0 02/06/2013   HGBA1C 6.4 09/19/2012     Assessment: Diabetes control: good control (HgbA1C at goal) Progress toward A1C goal:  at goal Comments: Patient's A1c is at goal on metformin 1000 mg twice a day.  His renal function historically had been normal, but recently show some decline in his renal function at the time of his hospitalization.    Plan: Medications:  The plan is to check a metabolic panel today and continue current regimen pending that result.  I discussed with him the potential problems with metformin, and depending on his labs may need to reduce or stop metformin and consider an alternative treatment for his diabetes. Home glucose monitoring: Frequency: once a day Timing: before breakfast

## 2013-08-01 NOTE — Assessment & Plan Note (Signed)
BP Readings from Last 3 Encounters:  08/01/13 180/85  07/18/13 159/84  07/13/13 147/72     Assessment: Blood pressure control: severely elevated Progress toward BP goal:  deteriorated Comments: Patient's blood pressure is poorly controlled on current regimen of carvedilol 25 mg twice a day, clonidine 0.1 mg twice a day, furosemide 60 mg daily, hydralazine 25 mg 3 times a day, hydrochlorothiazide 25 mg daily, labetalol 300 mg twice a day, and losartan 50 mg daily.  He brought his medications to clinic with him and I confirmed this list from his bottles, all of which have medications in them.  He reports that he takes his medications regularly without missing doses.  Plan: Medications:  The plan is to increase clonidine to a dose of 0.3 mg twice a day, which was the patient's previous clonidine dose.  Although there is some double therapy in his regimen (carvedilol and labetalol, furosemide and hydrochlorothiazide), I am reluctant to stop any of these medications at this time given his significant hypertension. Other plans: Patient will followup with nephrology as scheduled, and in our clinic on 12/29.

## 2013-08-01 NOTE — Patient Instructions (Addendum)
Increase clonidine 0.1 mg to a dose of 3 tablets twice a day. Please return as scheduled to see Dr. Owens Shark on 08/12/13 for follow up of your blood pressure.

## 2013-08-01 NOTE — Assessment & Plan Note (Signed)
Assessment:  A 24-hour urine collection 03/04/2013 showed total protein of 5,460 g and creatinine clearance of 80 mL/minute.  Patient was seen by Seward Meth at [place] and a repeat 24-hour urine showed 10,407 mg protein.  Patient underwent kidney biopsy on 05/30/2013; pathology showed advanced diffuse and nodular diabetic nephropathy with vascular changes consistent with long-standing difficult to control hypertension.  His diabetes is well controlled; I discussed with him today as before the importance of achieving good blood pressure control.  Plan: Patient has scheduled an appointment with a nephrologist at [facility], and plans to see him in January.

## 2013-08-02 ENCOUNTER — Telehealth: Payer: Self-pay | Admitting: Internal Medicine

## 2013-08-02 DIAGNOSIS — E119 Type 2 diabetes mellitus without complications: Secondary | ICD-10-CM

## 2013-08-02 MED ORDER — METFORMIN HCL 1000 MG PO TABS: 500.0000 mg | ORAL_TABLET | Freq: Two times a day (BID) | ORAL | Status: DC

## 2013-08-02 NOTE — Telephone Encounter (Signed)
Creat  Date Value Range Status  08/01/2013 1.61* 0.50 - 1.35 mg/dL Final   Patient's creatinine drawn yesterday was 1.61, with an estimated GFR of 54.  As per the recommendation in Up-to-Date regarding use of metformin in the setting of renal insufficiency, the plan is to reduce his metformin dose from 1000 mg twice a day to 500 mg twice a day.  I called patient and discussed this with him.

## 2013-08-02 NOTE — Assessment & Plan Note (Signed)
Telephone Contact Note  Creat  Date Value Range Status  08/01/2013 1.61* 0.50 - 1.35 mg/dL Final   Patient's creatinine drawn yesterday was 1.61, with an estimated GFR of 54.  As per the recommendation in Up-to-Date regarding use of metformin in the setting of renal insufficiency, the plan is to reduce his metformin dose from 1000 mg twice a day to 500 mg twice a day.  I called patient and discussed this with him.

## 2013-08-12 ENCOUNTER — Encounter: Payer: Self-pay | Admitting: Internal Medicine

## 2013-08-12 ENCOUNTER — Encounter (INDEPENDENT_AMBULATORY_CARE_PROVIDER_SITE_OTHER): Payer: Self-pay

## 2013-08-12 ENCOUNTER — Ambulatory Visit (INDEPENDENT_AMBULATORY_CARE_PROVIDER_SITE_OTHER): Payer: PRIVATE HEALTH INSURANCE | Admitting: Internal Medicine

## 2013-08-12 VITALS — BP 169/81 | HR 67 | Temp 97.1°F | Ht 72.0 in | Wt 216.5 lb

## 2013-08-12 DIAGNOSIS — I10 Essential (primary) hypertension: Secondary | ICD-10-CM

## 2013-08-12 MED ORDER — HYDRALAZINE HCL 50 MG PO TABS: 50.0000 mg | ORAL_TABLET | Freq: Three times a day (TID) | ORAL | Status: DC

## 2013-08-12 MED ORDER — LABETALOL HCL 300 MG PO TABS: 300.0000 mg | ORAL_TABLET | Freq: Two times a day (BID) | ORAL | Status: DC

## 2013-08-12 MED ORDER — FUROSEMIDE 40 MG PO TABS: 60.0000 mg | ORAL_TABLET | Freq: Every day | ORAL | Status: DC

## 2013-08-12 MED ORDER — METFORMIN HCL 500 MG PO TABS: 500.0000 mg | ORAL_TABLET | Freq: Two times a day (BID) | ORAL | Status: DC

## 2013-08-12 MED ORDER — POLYSACCHARIDE IRON COMPLEX 150 MG PO CAPS: 150.0000 mg | ORAL_CAPSULE | Freq: Two times a day (BID) | ORAL | Status: DC

## 2013-08-12 MED ORDER — CLONIDINE HCL 0.3 MG PO TABS: 0.3000 mg | ORAL_TABLET | Freq: Two times a day (BID) | ORAL | Status: DC

## 2013-08-12 NOTE — Patient Instructions (Signed)
General Instructions: Your blood pressure has improved, but it is still higher than we would like. -we are increasing your Hydralazine to 50 mg (1 tablet) three times per day -I've changed your Clonidine to the 0.3 mg tablet, instead of the 0.1 mg tablet, but continue the same dose at 0.3 mg twice per day  Please return for a blood pressure recheck in 1-2 weeks.  Treatment Goals:  Goals (1 Years of Data) as of 08/12/13         As of Today 08/01/13 08/01/13 08/01/13 08/01/13     Blood Pressure    . Blood Pressure < 130/80  169/81 180/85 185/87 189/89 178/78     Result Component    . HEMOGLOBIN A1C < 7.0          . LDL CALC < 70            Progress Toward Treatment Goals:  Treatment Goal 08/12/2013  Hemoglobin A1C at goal  Blood pressure improved    Self Care Goals & Plans:  Self Care Goal 07/18/2013  Manage my medications take my medicines as prescribed; bring my medications to every visit; refill my medications on time; follow the sick day instructions if I am sick  Monitor my health keep track of my blood glucose; bring my glucose meter and log to each visit; keep track of my blood pressure; keep track of my weight; check my feet daily  Eat healthy foods eat more vegetables; eat fruit for snacks and desserts; eat baked foods instead of fried foods; drink diet soda or water instead of juice or soda  Be physically active find an activity I enjoy; take a walk every day  Meeting treatment goals -    Home Blood Glucose Monitoring 08/01/2013  Check my blood sugar once a day  When to check my blood sugar before breakfast     Care Management & Community Referrals:  Referral 08/12/2013  Referrals made for care management support none needed  Referrals made to community resources -

## 2013-08-12 NOTE — Progress Notes (Signed)
Case discussed with Dr. Owens Shark at time of visit.  We reviewed the resident's history and exam and pertinent patient test results.  I agree with the assessment, diagnosis, and plan of care documented in the resident's note.

## 2013-08-12 NOTE — Assessment & Plan Note (Addendum)
BP Readings from Last 3 Encounters:  08/12/13 169/81  08/01/13 180/85  07/18/13 159/84    Lab Results  Component Value Date   NA 135 08/01/2013   K 3.9 08/01/2013   CREATININE 1.61* 08/01/2013    Assessment: Blood pressure control: moderately elevated Progress toward BP goal:  improved Comments: The patient's BP has improved somewhat, but remains elevated.  As noted by Dr. Marinda Elk, the patient is on both HCTZ and Lasix, and labetalol and carvedilol, but since BP remains elevated, we will avoid eliminating these redundancies until we have the blood pressure under control.  At this time, I will increase his hydralazine.  Plan: Medications:  Increase Hydralazine from 25 mg BID to 50 mg TID.  Continue clonidine 0.3 mg BID, coreg 25 mg BID, HCTZ 25 mg daily, Labetalol 300 mg BID, losartan 50 mg daily, lasix 60 mg daily. Educational resources provided:   Self management tools provided:   Other plans: If BP remains elevated at next visit, consider increasing Hydralazine vs Clonidine.

## 2013-08-12 NOTE — Progress Notes (Signed)
HPI The patient is a 57 y.o. male with a history of DM2, HTN, CHF (EF 55-60%), CVA, OSA, nephrotic syndrome, presenting for a 2-week BP recheck.  The patient was seen 12/18 for hypertension, with a systolic blood pressure in the 180s. At that time a full medication reconciliation was performed, and clonidine was increased to 0.3 mg twice per day. Today, blood pressure has improved somewhat, but is still elevated.  Full med rec was again performed.  Of note, after repeat questioning, the patient notes that he was mistakenly only taking hydralazine 25 mg BID, rather than the prescribed TID.  He notes no headache, blurry vision, double vision, nausea, vomiting.  ROS: General: no fevers, chills, changes in weight, changes in appetite Skin: no rash HEENT: no blurry vision, hearing changes, sore throat Pulm: no dyspnea, coughing, wheezing CV: no chest pain, palpitations, shortness of breath Abd: no abdominal pain, nausea/vomiting, diarrhea/constipation GU: no dysuria, hematuria, polyuria Ext: no arthralgias, myalgias Neuro: no weakness, numbness, or tingling  Filed Vitals:   08/12/13 1423  BP: 169/81  Pulse: 67  Temp: 97.1 F (36.2 C)    PEX General: alert, cooperative, and in no apparent distress HEENT: pupils equal round and reactive to light, vision grossly intact, oropharynx clear and non-erythematous  Neck: supple, no lymphadenopathy, no JVD Lungs: clear to ascultation bilaterally, normal work of respiration, no wheezes, rales, ronchi Heart: regular rate and rhythm, no murmurs, gallops, or rubs Abdomen: soft, non-tender, non-distended, normal bowel sounds Extremities: no cyanosis, clubbing, or edema Neurologic: alert & oriented X3, cranial nerves II-XII intact, strength grossly intact, sensation intact to light touch  Current Outpatient Prescriptions on File Prior to Visit  Medication Sig Dispense Refill  . BD PEN NEEDLE NANO U/F 32G X 4 MM MISC USE AS DIRECTED FOR DAILY INSULIN  INJECTION  100 each  3  . carvedilol (COREG) 25 MG tablet Take 1 tablet (25 mg total) by mouth 2 (two) times daily with a meal.  180 tablet  2  . cloNIDine (CATAPRES) 0.1 MG tablet Take 3 tablets (0.3 mg total) by mouth 2 (two) times daily.  180 tablet  5  . clopidogrel (PLAVIX) 75 MG tablet Take 1 tablet (75 mg total) by mouth daily.  90 tablet  3  . digoxin (DIGOX) 0.25 MG tablet Take 1 tablet (0.25 mg total) by mouth daily.  90 tablet  1  . fluticasone (FLONASE) 50 MCG/ACT nasal spray Place 2 sprays into both nostrils daily.      . furosemide (LASIX) 40 MG tablet Take 1.5 tablets (60 mg total) by mouth daily.  45 tablet  0  . gabapentin (NEURONTIN) 300 MG capsule Take 1 capsule (300 mg total) by mouth daily.  90 capsule  1  . glucose blood (ONE TOUCH ULTRA TEST) test strip Use as instructed to check blood sugar twice a day.  ICD-9 250.00, on insulin.  100 each  6  . hydrALAZINE (APRESOLINE) 50 MG tablet Take 0.5 tablets (25 mg total) by mouth 3 (three) times daily.  270 tablet  3  . hydrochlorothiazide (HYDRODIURIL) 25 MG tablet Take 1 tablet (25 mg total) by mouth daily.  90 tablet  3  . Insulin Glargine (LANTUS SOLOSTAR) 100 UNIT/ML SOPN Inject 15 Units into the skin at bedtime.  15 mL  3  . iron polysaccharides (FERREX 150) 150 MG capsule Take 150 mg by mouth 2 (two) times daily.      [name] labetalol (NORMODYNE) 300 MG tablet Take 300 mg by  mouth 2 (two) times daily.      . Lancets (ONETOUCH ULTRASOFT) lancets Use as instructed to check blood sugar two times a day.  100 each  6  . losartan (COZAAR) 50 MG tablet Take 1 tablet (50 mg total) by mouth daily.  90 tablet  3  . metFORMIN (GLUCOPHAGE) 1000 MG tablet Take 0.5 tablets (500 mg total) by mouth 2 (two) times daily with a meal.      . Multiple Vitamin (MULITIVITAMIN WITH MINERALS) TABS Take 1 tablet by mouth every morning.       . niacin 500 MG CR capsule Take 500 mg by mouth at bedtime.      . potassium chloride SA (KLOR-CON M20) 20 MEQ  tablet Take 40 mEq by mouth 2 (two) times daily.      . pravastatin (PRAVACHOL) 40 MG tablet Take 1 tablet (40 mg total) by mouth daily.  90 tablet  1   No current facility-administered medications on file prior to visit.    Assessment/Plan

## 2013-08-25 ENCOUNTER — Emergency Department (HOSPITAL_COMMUNITY): Payer: PRIVATE HEALTH INSURANCE

## 2013-08-25 ENCOUNTER — Encounter (HOSPITAL_COMMUNITY): Payer: Self-pay | Admitting: Emergency Medicine

## 2013-08-25 ENCOUNTER — Emergency Department (HOSPITAL_COMMUNITY)
Admission: EM | Admit: 2013-08-25 | Discharge: 2013-08-25 | Disposition: A | Discharge status: Home / Self Care | Payer: PRIVATE HEALTH INSURANCE | Attending: Emergency Medicine | Admitting: Emergency Medicine

## 2013-08-25 DIAGNOSIS — H9190 Unspecified hearing loss, unspecified ear: Secondary | ICD-10-CM | POA: Insufficient documentation

## 2013-08-25 DIAGNOSIS — I10 Essential (primary) hypertension: Secondary | ICD-10-CM | POA: Insufficient documentation

## 2013-08-25 DIAGNOSIS — Z7902 Long term (current) use of antithrombotics/antiplatelets: Secondary | ICD-10-CM | POA: Insufficient documentation

## 2013-08-25 DIAGNOSIS — I428 Other cardiomyopathies: Secondary | ICD-10-CM | POA: Insufficient documentation

## 2013-08-25 DIAGNOSIS — E1142 Type 2 diabetes mellitus with diabetic polyneuropathy: Secondary | ICD-10-CM | POA: Insufficient documentation

## 2013-08-25 DIAGNOSIS — I509 Heart failure, unspecified: Secondary | ICD-10-CM | POA: Insufficient documentation

## 2013-08-25 DIAGNOSIS — E1149 Type 2 diabetes mellitus with other diabetic neurological complication: Secondary | ICD-10-CM | POA: Insufficient documentation

## 2013-08-25 DIAGNOSIS — J069 Acute upper respiratory infection, unspecified: Secondary | ICD-10-CM

## 2013-08-25 DIAGNOSIS — Z9889 Other specified postprocedural states: Secondary | ICD-10-CM | POA: Insufficient documentation

## 2013-08-25 DIAGNOSIS — IMO0002 Reserved for concepts with insufficient information to code with codable children: Secondary | ICD-10-CM | POA: Insufficient documentation

## 2013-08-25 DIAGNOSIS — E785 Hyperlipidemia, unspecified: Secondary | ICD-10-CM | POA: Insufficient documentation

## 2013-08-25 DIAGNOSIS — Z8673 Personal history of transient ischemic attack (TIA), and cerebral infarction without residual deficits: Secondary | ICD-10-CM | POA: Insufficient documentation

## 2013-08-25 DIAGNOSIS — Z20828 Contact with and (suspected) exposure to other viral communicable diseases: Secondary | ICD-10-CM | POA: Insufficient documentation

## 2013-08-25 DIAGNOSIS — Z872 Personal history of diseases of the skin and subcutaneous tissue: Secondary | ICD-10-CM | POA: Insufficient documentation

## 2013-08-25 DIAGNOSIS — Z79899 Other long term (current) drug therapy: Secondary | ICD-10-CM | POA: Insufficient documentation

## 2013-08-25 DIAGNOSIS — R509 Fever, unspecified: Secondary | ICD-10-CM

## 2013-08-25 DIAGNOSIS — Z794 Long term (current) use of insulin: Secondary | ICD-10-CM | POA: Insufficient documentation

## 2013-08-25 LAB — POCT I-STAT, CHEM 8
BUN: 21 mg/dL (ref 6–23)
Calcium, Ion: 1.14 mmol/L (ref 1.12–1.23)
Chloride: 97 mEq/L (ref 96–112)
Creatinine, Ser: 1.9 mg/dL — ABNORMAL HIGH (ref 0.50–1.35)
Glucose, Bld: 145 mg/dL — ABNORMAL HIGH (ref 70–99)
HEMATOCRIT: 36 % — AB (ref 39.0–52.0)
Hemoglobin: 12.2 g/dL — ABNORMAL LOW (ref 13.0–17.0)
POTASSIUM: 4.1 meq/L (ref 3.7–5.3)
SODIUM: 138 meq/L (ref 137–147)
TCO2: 29 mmol/L (ref 0–100)

## 2013-08-25 MED ORDER — OSELTAMIVIR PHOSPHATE 75 MG PO CAPS: 75.0000 mg | ORAL_CAPSULE | Freq: Two times a day (BID) | ORAL | Status: DC

## 2013-08-25 MED ORDER — ACETAMINOPHEN 325 MG PO TABS
650.0000 mg | ORAL_TABLET | Freq: Once | ORAL | Status: AC
  Administered 2013-08-25: 650 mg via ORAL
  Filled 2013-08-25: qty 2

## 2013-08-25 NOTE — ED Notes (Addendum)
Patient complaining of "sniffing and sneezing" since last night. Denies body aches, n/v/d. Patient B/P 164/106 at triage.

## 2013-08-25 NOTE — ED Provider Notes (Signed)
CSN: [id number]     Arrival date & time 08/25/13  1916 History   First MD Initiated Contact with Patient 08/25/13 2000     Chief Complaint  Patient presents with  . Nasal Congestion    HPI Pt was seen at 2005.  Per pt, c/o gradual onset and persistence of constant runny/stuffy nose and sneezing since last night. States today he has had "chills" and felt "shakey." Endorses he has been taking care of 2 grandchildren whom have been dx with "the flu," and he is concerned regarding same.  Denies home fevers, no sore throat, no rash, no neck pain, no CP/SOB, no cough, no N/V/D, no abd pain.     Past Medical History  Diagnosis Date  . Dermatitis   . CHF (congestive heart failure)     LV function improved from 2004 to 2008.  Historically, moderately dilated LV with EF 30-40% by 2D echo 08/14/2002.  Mild CAD with severe LV dysfunction by cardiac cath 09/2002.  Normal coronary arteries and normal LV function by cardiac cath 09/19/2006.  A 2-D echo on 04/01/2009 showed mild concentric hypertrophy and normal systolic (LVEF  123456) and doppler C/W with grade 1 diastolic dysfunction.  . Hypertension   . Diabetes mellitus   . Hyperlipidemia   . Hearing loss in right ear   . Cardiomyopathy     LV function improved from 2004 to 2008.  Historically, moderately dilated LV with EF 30-40% by 2D echo 08/14/2002.  Mild CAD with severe LV dysfunction by cardiac cath 09/2002.  Normal coronary arteries and normal LV function by cardiac cath 09/19/2006.  A 2-D echo on 04/01/2009 showed mild concentric hypertrophy and normal systolic (LVEF  123456) and doppler C/W with grade 1 diastolic dysfunction.  . DM neuropathy, painful   . CVA (cerebral vascular accident) 07/04/2012    MRI of the brain 07/04/2012 showed an acute infarct in the right basal ganglia involving the anterior putamen, anterior limb internal capsule, and head of the caudate; this measured approximately 2.5 cm in diameter.     . Hypertensive crisis 07/28/2012    Past Surgical History  Procedure Laterality Date  . Colonoscopy    . Polypectomy    . Cardiac catheterization      3 times  . Foot surgery     Family History  Problem Relation Age of Onset  . Colon cancer Sister   . Aneurysm Father 7    died of rupture   History  Substance Use Topics  . Smoking status: Never Smoker   . Smokeless tobacco: Never Used  . Alcohol Use: Yes     Comment: Wine occasionally (no more than 2 glasses per month)    Review of Systems ROS: Statement: All systems negative except as marked or noted in the HPI; Constitutional: Negative for fever and +chills, "shakey." ; ; Eyes: Negative for eye pain, redness and discharge. ; ; ENMT: Negative for ear pain, hoarseness, sinus pressure and sore throat. +nasal congestion, sneezing. ; Cardiovascular: Negative for chest pain, palpitations, diaphoresis, dyspnea and peripheral edema. ; ; Respiratory: Negative for cough, wheezing and stridor. ; ; Gastrointestinal: Negative for nausea, vomiting, diarrhea, abdominal pain, blood in stool, hematemesis, jaundice and rectal bleeding. . ; ; Genitourinary: Negative for dysuria, flank pain and hematuria. ; ; Musculoskeletal: Negative for back pain and neck pain. Negative for swelling and trauma.; ; Skin: Negative for pruritus, rash, abrasions, blisters, bruising and skin lesion.; ; Neuro: Negative for headache, lightheadedness and neck stiffness. Negative  for weakness, altered level of consciousness , altered mental status, extremity weakness, paresthesias, involuntary movement, seizure and syncope.      Allergies  Amlodipine and Lisinopril  Home Medications   Current Outpatient Rx  Name  Route  Sig  Dispense  Refill  . carvedilol (COREG) 25 MG tablet   Oral   Take 25 mg by mouth 2 (two) times daily.         . cloNIDine (CATAPRES) 0.3 MG tablet   Oral   Take 0.3 mg by mouth 2 (two) times daily.         . clopidogrel (PLAVIX) 75 MG tablet   Oral   Take 75 mg by mouth  every morning.         . digoxin (LANOXIN) 0.25 MG tablet   Oral   Take 0.25 mg by mouth every morning.         . fluticasone (FLONASE) 50 MCG/ACT nasal spray   Each Nare   Place 2 sprays into both nostrils daily.         . furosemide (LASIX) 40 MG tablet   Oral   Take 60 mg by mouth every morning.         . gabapentin (NEURONTIN) 300 MG capsule   Oral   Take 300 mg by mouth every morning.         . hydrALAZINE (APRESOLINE) 50 MG tablet   Oral   Take 50 mg by mouth 2 (two) times daily.         . hydrochlorothiazide (HYDRODIURIL) 25 MG tablet   Oral   Take 25 mg by mouth every morning.         . Insulin Glargine (LANTUS SOLOSTAR) 100 UNIT/ML SOPN   Subcutaneous   Inject 15 Units into the skin at bedtime.   15 mL   3   . labetalol (NORMODYNE) 300 MG tablet   Oral   Take 1 tablet (300 mg total) by mouth 2 (two) times daily.   180 tablet   3   . losartan (COZAAR) 50 MG tablet   Oral   Take 50 mg by mouth every morning.         . metFORMIN (GLUCOPHAGE) 500 MG tablet   Oral   Take 250 mg by mouth 2 (two) times daily.         . Multiple Vitamin (MULITIVITAMIN WITH MINERALS) TABS   Oral   Take 1 tablet by mouth every morning.          . potassium chloride SA (KLOR-CON M20) 20 MEQ tablet   Oral   Take 10 mEq by mouth 2 (two) times daily.          . pravastatin (PRAVACHOL) 40 MG tablet   Oral   Take 1 tablet (40 mg total) by mouth daily.   90 tablet   1    BP 164/106  Pulse 98  Temp(Src) 100.7 F (38.2 C) (Oral)  Resp 20  Ht 5' 11.5" (1.816 m)  Wt 204 lb (92.534 kg)  BMI 28.06 kg/m2  SpO2 99% Physical Exam 2010: Physical examination:  Nursing notes reviewed; Vital signs and O2 SAT reviewed;  Constitutional: Well developed, Well nourished, Well hydrated, In no acute distress; Head:  Normocephalic, atraumatic; Eyes: EOMI, PERRL, No scleral icterus; ENMT: TM's clear bilat. +edemetous nasal turbinates bilat with clear rhinorrhea. Mouth  and pharynx without lesions. No tonsillar exudates. No intra-oral edema. No submandibular or sublingual edema. No hoarse voice, no drooling,  no stridor. No pain with manipulation of larynx. Mouth and pharynx normal, Mucous membranes moist; Neck: Supple, Full range of motion, No lymphadenopathy; Cardiovascular: Regular rate and rhythm, No gallop; Respiratory: Breath sounds clear & equal bilaterally, No wheezes.  Speaking full sentences with ease, Normal respiratory effort/excursion; Chest: Nontender, Movement normal; Abdomen: Soft, Nontender, Nondistended, Normal bowel sounds; Genitourinary: No CVA tenderness; Extremities: Pulses normal, No tenderness, No edema, No calf edema or asymmetry.; Neuro: AA&Ox3, Major CN grossly intact.  Speech clear. No gross focal motor or sensory deficits in extremities. Climbs on and off chair at bedside easily by himself. Gait steady.; Skin: Color normal, Warm, Dry.   ED Course  Procedures   EKG Interpretation   None       MDM  MDM Reviewed: previous chart, nursing note and vitals Interpretation: labs   Results for orders placed during the hospital encounter of 08/25/13  POCT I-STAT, CHEM 8      Result Value Range   Sodium 138  137 - 147 mEq/L   Potassium 4.1  3.7 - 5.3 mEq/L   Chloride 97  96 - 112 mEq/L   BUN 21  6 - 23 mg/dL   Creatinine, Ser 1.90 (*) 0.50 - 1.35 mg/dL   Glucose, Bld 145 (*) 70 - 99 mg/dL   Calcium, Ion 1.14  1.12 - 1.23 mmol/L   TCO2 29  0 - 100 mmol/L   Hemoglobin 12.2 (*) 13.0 - 17.0 g/dL   HCT 36.0 (*) 39.0 - 52.0 %   Results for PEREGRIN, KORNFELD (MRN [name]) as of 08/25/2013 21:21  Ref. Range 07/10/2013 01:34 07/10/2013 05:54 07/12/2013 05:22 07/13/2013 05:54 08/01/2013 17:01 08/25/2013 20:41  BUN Latest Range: 6-23 mg/dL 32 (H) 32 (H) 16 21 28  (H) 21  Creatinine Latest Range: 0.50-1.35 mg/dL 2.14 (H) 1.91 (H) 1.62 (H) 1.67 (H) 1.61 (H) 1.90 (H)   Dg Chest 2 View 08/25/2013   CLINICAL DATA:  Chest pain.  Cough.  EXAM:  CHEST  2 VIEW  COMPARISON:  Portable examination 07/10/2013. Two-view chest x-ray 11/24/2012, 07/28/2012.  FINDINGS: Cardiac silhouette upper normal in size, unchanged. Thoracic aorta tortuous, unchanged. Hilar and mediastinal contours otherwise unremarkable. Mildly prominent bronchovascular markings diffusely and mild central peribronchial thickening, unchanged. No new pulmonary parenchymal abnormalities. No pleural effusions. Pulmonary vascularity normal. No pneumothorax. Degenerative changes and DISH involving the thoracic spine.  IMPRESSION: Stable mild to moderate changes of chronic bronchitis and/or asthma. No acute cardiopulmonary disease.   Electronically Signed   By: Evangeline Dakin M.D.   On: 08/25/2013 22:18    2240:  Fever improving after APAP. Pt states he is due to take his multiple usual BP meds tonight and will take them when he goes home. Pt wants to go home now. Pt with flu exposure from his 2 grandkids, will tx with tamiflu. Dx and testing d/w pt.  Questions answered.  Verb understanding, agreeable to d/c home with outpt f/u.      Alfonzo Feller, DO 08/28/13 [phone]

## 2013-08-25 NOTE — Discharge Instructions (Signed)
°Emergency Department Resource Guide °1) Find a Doctor and Pay Out of Pocket °Although you won't have to find out who is covered by your insurance plan, it is a good idea to ask around and get recommendations. You will then need to call the office and see if the doctor you have chosen will accept you as a new patient and what types of options they offer for patients who are self-pay. Some doctors offer discounts or will set up payment plans for their patients who do not have insurance, but you will need to ask so you aren't surprised when you get to your appointment. ° °2) Contact Your Local Health Department °Not all health departments have doctors that can see patients for sick visits, but many do, so it is worth a call to see if yours does. If you don't know where your local health department is, you can check in your phone book. The CDC also has a tool to help you locate your state's health department, and many state websites also have listings of all of their local health departments. ° °3) Find a Walk-in Clinic °If your illness is not likely to be very severe or complicated, you may want to try a walk in clinic. These are popping up all over the country in pharmacies, drugstores, and shopping centers. They're usually staffed by nurse practitioners or physician assistants that have been trained to treat common illnesses and complaints. They're usually fairly quick and inexpensive. However, if you have serious medical issues or chronic medical problems, these are probably not your best option. ° °No Primary Care Doctor: °- Call Health Connect at  832-8000 - they can help you locate a primary care doctor that  accepts your insurance, provides certain services, etc. °- Physician Referral Service- 1-800-533-3463 ° °Chronic Pain Problems: °Organization         Address  Phone   Notes  °[facility]  (336) 297-2271 Patients need to be referred by their primary care doctor.  ° °Medication  Assistance: °Organization         Address  Phone   Notes  °[place] [facility] [address] °[place], [place] [address] (336) 641-8030 --Must be a resident of [place] °-- Must have NO insurance coverage whatsoever (no Medicaid/ Medicare, etc.) °-- The pt. MUST have a primary care doctor that directs their care regularly and follows them in the community °  °[facility]  (866) 331-1348   °[employer]  (888) 892-1162   ° °Agencies that provide inexpensive medical care: °Organization         Address  Phone   Notes  °[facility]  (336) 832-8035   °[facility]    (336) 832-7272   °[facility] [address] °[place],  [address] (336) 832-4777   °[facility] [address], °[place] (336) 271-4999   °[facility]    (336) 373-0678   °[facility]    (336) 272-1050   °[facility] ° [address], [place] Phone:  (336) 832-4444, Fax:  (336) 832-4440 Hours of Operation:  9 am - 6 pm, M-F.  Also accepts Medicaid/Medicare and self-pay.  °[facility] ° [address], [place] Phone: (336) 832-3150, Fax: (336) [phone]. Hours of Operation:  8:30 am - 5:30 pm, M-F.  Also accepts Medicaid and self-pay.  °[facility] [place] [address], [place] Phone: (336) 878-6027   °[facility] [address], [place], [place] (336)723-1848, Ext. 123 Mondays & Thursdays: 7-9 AM.  First 15 patients are seen on a first come, first serve basis. °  ° °Medicaid-accepting [place] Providers: ° °Organization         Address  Phone   Notes  °[facility] [name], [place] (336) 641-2100 Also accepts self-pay patients.  °[facility] [address], [place] ° (336) 856-9996   °[facility] [address], [place]  (336) 288-8857   °[facility] [address] [place] [address], [place] (336) 299-7000   °Veita Bland [address], [place]  ° (336) 373-1557 Only accepts [place] Access Medicaid patients after they have their name applied to their card.  ° °Self-Pay (no insurance) in [place]: ° °Organization         Address  Phone   Notes  °Sickle Cell Patients, [facility] [address], [place] (336) 832-1970   °[facility] [address], [place] (336) 832-4400   °[facility] [place] ° [address], [place] (336) 992-4800   °[facility]/Dr. Osei-Bonsu ° [address] [place] [address], [place] or [address], [place] (336) [phone] Phone number for both [place] and [place] locations is the same.  °[facility] [address], [place] (336) 299-0000   °[facility] [address] [place] [address], [place] or [address] (336) 852-7530 °(336) 878-2260   °[facility] [address], [place] (336) 350-1642, phone; (336) 294-5005, fax Sees patients 1st and 3rd Saturday of every month.  Must not qualify for public or private insurance (i.e. Medicaid, Medicare, [place] Health Choice, Veterans' Benefits) • Household income should be no more than 200% of the poverty level •The clinic cannot treat you if you are pregnant or think you are pregnant • Sexually transmitted diseases are not treated at the clinic.  ° ° °Dental Care: °Organization         Address  Phone  Notes  °[place] [facility] [address], [place] (336) 641-6152 Accepts children up to age 21 who are enrolled in Medicaid or [place] Health Choice; pregnant women with a Medicaid card; and children who have applied for Medicaid or [place] Health Choice, but were declined, whose parents can pay a reduced fee at time of service.  °[place]  [facility] [place]  [address], [place] (336) 641-7733 Accepts children up to age 21 who are enrolled in Medicaid or [place] Health Choice; pregnant women with a Medicaid card; and children who have applied for Medicaid or [place] Health Choice, but were declined, whose parents can pay a reduced fee at time of service.  °[facility] ° [address], [place] (336) 641-4533 Patients are seen by appointment only. Walk-ins are not accepted. [facility] will see patients 18 years of age and older. °Monday - Tuesday (8am-5pm) °Most Wednesdays (8:30-5pm) °$30 per visit, cash only  °[facility] ° [address], [place] (336) 641-4533 Patients are seen by appointment only. Walk-ins are not accepted. [facility] will see patients 18 years of age and older. °One   Wednesday Evening (Monthly: Volunteer Based).  $30 per visit, cash only  °[facility]  (919) 537-3737 for adults; Children under age 4, call Graduate Pediatric Dentistry at ([phone]) [phone]. Children aged 4-14, please call ([phone]) [phone] to request a pediatric application. ° Dental services are provided in all areas of dental care including fillings, crowns and bridges, complete and partial dentures, implants, gum treatment, root canals, and extractions. Preventive care is also provided. Treatment is provided to both adults and children. °Patients are selected via a lottery and there is often a waiting list. °  °[facility] [address], °[place] ° (336) 763-8833 www.drcivils.[email or web address] °  °[facility] [address], [place], [place] (336)723-1848, Ext. 123 Second and Fourth Thursday of each month, opens at 6:30 AM; Clinic ends at 9 AM.  Patients are seen on a first-come first-served basis, and a limited number are seen during each clinic.  ° °[facility] ° [address], [place], [place] (336) [phone]    Eligibility Requirements °You must have lived in [place], [place], or [place] counties for at least the last three months. °  You cannot be eligible for state or federal sponsored healthcare insurance, including Veterans Administration, Medicaid, or Medicare. °  You generally cannot be eligible for healthcare insurance through your employer.  °  How to apply: °Eligibility screenings are held every Tuesday and Wednesday afternoon from 1:00 pm until 4:00 pm. You do not need an appointment for the interview!  °[facility] [address], [place], [place] 336-631-2330   °[facility]  336-342-8273   °[facility]  336-703-3100   °[facility]  336-570-6415   ° °Behavioral Health Resources in the Community: °Intensive Outpatient Programs °Organization         Address  Phone  Notes  °[facility] [address], [place], [place] 336-878-6098   °[facility] [address], [place], [place] 336-832-9800   °[facility] [address], [place], [place] ° 336-882-2125   °[facility] [address],  °[place], [place] 1-800-853-5163 or 336-641-4981   °Substance Abuse Resources °Organization         Address  Phone  Notes  °[facility]  336-882-2125   °[facility]  336-784-9470   °[facility]  336-285-9073   °[facility]  336-845-3988   °Residential & Outpatient Substance Abuse Program  1-800-659-3381   °Psychological Services °Organization         Address  Phone  Notes  °[facility]  336- 832-9600   °[facility]  336- 378-7881   °[facility] [address], [place] 1-800-853-5163 or 336-641-4981   ° °Mobile Crisis Teams °Organization         Address  Phone  Notes  °[facility], Mobile Crisis Care Unit  1-877-626-1772   °Assertive °[facility] ° [address]  [place], [place] 336-834-9664   °Sharon DeEsch [address] °[place] 336-554-5454   ° °Self-Help/Support Groups °Organization         Address  Phone             Notes  °[facility] [place] - variety of support groups  336- 373-1402 Call for more information  °Narcotics Anonymous (NA), [facility] [address], °[place]  2 meetings at this location  ° °  Residential Treatment Programs Organization         Address  Phone  Notes  [facility] [address],    [place]  1-(367)269-1598   [facility]  [address] [place] [id number], [place], [place]   [address], [place] 705-543-1003 Admissions: 8am-3pm M-F  [facility] [place] [address],    [place], [place] X4321937   [facility] [address] [place], [place], [place]   [facility] [address],  [place], [place]   [facility] Intensive Outpatient [address], [place], [place]   [facility] [place]  [place], [place] 1-336-738-7177 or (581) 179-6392   [facility] [address] [place], [place] Accepts Medicaid  [facility] [address]  [place] 1-647-141-7849 Substance Abuse/Addiction Treatment   [facility]         Address  Phone  Notes  [facility]  (281)754-2381   Domenic Schwab, PhD [address] [place], [place]   507-527-2924 or (956)[phone]   [facility] [place], [place] [phone]   [facility] [address], [place], [place] [phone] Insurance/Medicaid/sponsorship through [facility] [address] [place]                                    [place], [place] [phone] [facility] [address], [place] [phone]    Dr. Adele Schilder  ([phone]) [phone]   [facility] [employer] [facility] [facility] 1) [address]  2) [place] 3)  [place] [address], [place] [phone]  [phone]   [place] [phone] or [phone] (After Hours)      Take the prescription as directed. Take over the counter tylenol, as directed on packaging, as needed for fever or discomfort.  Call your regular medical doctor tomorrow to schedule a follow up appointment within the next 1 to 2 days.  Return to the Emergency Department immediately sooner if worsening.

## 2013-08-28 ENCOUNTER — Ambulatory Visit: Payer: PRIVATE HEALTH INSURANCE | Admitting: Internal Medicine

## 2013-08-28 ENCOUNTER — Encounter: Payer: Self-pay | Admitting: Internal Medicine

## 2013-08-29 ENCOUNTER — Encounter: Payer: Self-pay | Admitting: Internal Medicine

## 2013-09-01 ENCOUNTER — Other Ambulatory Visit: Payer: Self-pay | Admitting: Internal Medicine

## 2013-09-05 ENCOUNTER — Telehealth: Payer: Self-pay | Admitting: Dietician

## 2013-09-05 DIAGNOSIS — E119 Type 2 diabetes mellitus without complications: Secondary | ICD-10-CM

## 2013-09-05 NOTE — Telephone Encounter (Signed)
Charles Hart called for assistance with meter that stopped working. I was unable to help him get it working over the phone. He requests a new meter Rx be sent to his new pharmacy  [facility] in [place].

## 2013-09-06 MED ORDER — GLUCOSE BLOOD VI STRP: ORAL_STRIP | Status: DC

## 2013-09-06 MED ORDER — ACCU-CHEK NANO SMARTVIEW W/DEVICE KIT
PACK | Status: DC
Start: 2013-09-05 — End: 2014-05-18

## 2013-09-06 MED ORDER — ACCU-CHEK FASTCLIX LANCETS MISC: Status: DC

## 2013-09-27 ENCOUNTER — Ambulatory Visit (INDEPENDENT_AMBULATORY_CARE_PROVIDER_SITE_OTHER): Payer: PRIVATE HEALTH INSURANCE | Admitting: Cardiovascular Disease

## 2013-09-27 ENCOUNTER — Encounter: Payer: Self-pay | Admitting: Cardiovascular Disease

## 2013-09-27 VITALS — BP 180/90 | HR 68 | Ht 72.0 in | Wt 212.0 lb

## 2013-09-27 DIAGNOSIS — I10 Essential (primary) hypertension: Secondary | ICD-10-CM

## 2013-09-27 DIAGNOSIS — I509 Heart failure, unspecified: Secondary | ICD-10-CM

## 2013-09-27 DIAGNOSIS — E119 Type 2 diabetes mellitus without complications: Secondary | ICD-10-CM

## 2013-09-27 MED ORDER — HYDRALAZINE HCL 50 MG PO TABS: 100.0000 mg | ORAL_TABLET | Freq: Three times a day (TID) | ORAL | Status: DC

## 2013-09-27 MED ORDER — HYDRALAZINE HCL 50 MG PO TABS: 100.0000 mg | ORAL_TABLET | Freq: Two times a day (BID) | ORAL | Status: DC

## 2013-09-27 NOTE — Assessment & Plan Note (Signed)
Increase hydralazine to tid and cozaar to 100mg   F/U primary and renal for any further changes

## 2013-09-27 NOTE — Assessment & Plan Note (Signed)
Discussed low carb diet.  Target hemoglobin A1c is 6.5 or less.  Continue current medications.  

## 2013-09-27 NOTE — Assessment & Plan Note (Signed)
Echo 8/14 normal EF history of diastolic dysfunction as well Stable  Worsening renal function will likely be issue that sets off CHF in future

## 2013-09-27 NOTE — Progress Notes (Signed)
Patient ID: Charles Hart., male   DOB: [birth year], 58 y.o.   MRN: [id number] 58 yo with DM,HTN history of abnormal ECG and DCM. Previous EF 30-35% with poorly controlled BP. Improved to normal on echo 2010 with compliance of meds. No dyspnea,sscp,palpitatins,edema or syncope. Primary has stopped his actos and glipizide and started By[place]. Compliant with meds and BP has been good. ECG chronically abnormal with LVH changes. Saw Dr Katy Fitch in January and eyes are fine. No other end organ damage from DM. BS out of control in January due to med compliance issues. 2 grand daughters were living with him for a while and there was a lot of stress. 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  ------------------------------------------------------------ History: PMH: Coronary artery disease. Cardiomyopathy of unknown etiology. Stroke. Risk factors: Hypertension. Diabetes mellitus.  ------------------------------------------------------------ Study Conclusions  - Left ventricle: The cavity size was normal. Wall thickness was increased in a pattern of moderate LVH. Systolic function was normal. The estimated ejection fraction was in the range of 55% to 60%. Wall motion was normal; there were no regional wall motion abnormalities. Features are consistent with a pseudonormal left ventricular filling pattern, with concomitant abnormal relaxation and increased filling pressure (grade 2 diastolic dysfunction). - Aortic valve: There was no stenosis. - Mitral valve: Mildly calcified annulus. Trivial regurgitation. - Left atrium: The atrium was mildly dilated. - Right ventricle: The cavity size was normal. Systolic function was normal. - Right atrium: The atrium was mildly dilated. - Pulmonary arteries: No complete TR doppler  jet so unable to estimate PA systolic pressure. - Systemic veins: IVC measured 2.1 cm with normal respirophasic variation, suggesting RA pressure 6-10 mmHg.  Mild CRF  Cr 1.9  Indicates BP is labile  URI with sinus congestion   ROS: Denies fever, malais, weight loss, blurry vision, decreased visual acuity, cough, sputum, SOB, hemoptysis, pleuritic pain, palpitaitons, heartburn, abdominal pain, melena, lower extremity edema, claudication, or rash.  All other systems reviewed and negative  General: Affect appropriate Overweight black male with poor dentition  HEENT: normal Neck supple with no adenopathy JVP normal no bruits no thyromegaly Lungs clear with no wheezing and good diaphragmatic motion Heart:  S1/S2 no murmur, no rub, gallop or click PMI normal Abdomen: benighn, BS positve, no tenderness, no AAA no bruit.  No HSM or HJR Distal pulses intact with no bruits No edema Neuro non-focal Skin warm and dry No muscular weakness   Current Outpatient Prescriptions  Medication Sig Dispense Refill  . ACCU-CHEK FASTCLIX LANCETS MISC Check blood sugar as directed up to 3 times a day dx code 250.00  102 each  5  . Blood Glucose Monitoring Suppl (ACCU-CHEK NANO SMARTVIEW) W/DEVICE KIT Check blood sugar as directed up to 3 times a day dx code 250.00  1 kit  0  . carvedilol (COREG) 25 MG tablet Take 25 mg by mouth 2 (two) times daily.      . cloNIDine (CATAPRES) 0.3 MG tablet Take 0.3 mg by mouth 2 (two) times daily.      . clopidogrel (PLAVIX) 75 MG tablet Take 75 mg by mouth every morning.      . digoxin (LANOXIN) 0.25 MG tablet Take 0.25 mg by mouth every morning.      . fluticasone (FLONASE) 50 MCG/ACT nasal spray Place 2 sprays into both nostrils daily.      . furosemide (LASIX) 40 MG  tablet Take 1.5 tablets (60 mg total) by mouth daily.  45 tablet  2  . gabapentin (NEURONTIN) 300 MG capsule Take 300 mg by mouth every morning.      [name] glucose blood (ACCU-CHEK SMARTVIEW) test strip  Check blood sugar as directed up to 3 times a day dx code 250.00  100 each  5  . hydrALAZINE (APRESOLINE) 50 MG tablet Take 50 mg by mouth 2 (two) times daily.      . hydrochlorothiazide (HYDRODIURIL) 25 MG tablet Take 25 mg by mouth every morning.      . Insulin Glargine (LANTUS SOLOSTAR) 100 UNIT/ML SOPN Inject 15 Units into the skin at bedtime.  15 mL  3  . labetalol (NORMODYNE) 300 MG tablet Take 1 tablet (300 mg total) by mouth 2 (two) times daily.  180 tablet  3  . losartan (COZAAR) 50 MG tablet Take 50 mg by mouth every morning.      . metFORMIN (GLUCOPHAGE) 500 MG tablet Take 250 mg by mouth 2 (two) times daily.      . Multiple Vitamin (MULITIVITAMIN WITH MINERALS) TABS Take 1 tablet by mouth every morning.       . pravastatin (PRAVACHOL) 40 MG tablet Take 1 tablet (40 mg total) by mouth daily.  90 tablet  1  . glucose blood (ACCU-CHEK SMARTVIEW) test strip Use as instructed to check blood sugar up to 2 times daily.  Dx code 250.00  100 each  12   No current facility-administered medications for this visit.    Allergies  Amlodipine and Lisinopril  Electrocardiogram:  07/10/13 SR LVH with strain   Assessment and Plan

## 2013-09-27 NOTE — Patient Instructions (Addendum)
Your physician recommends that you schedule a follow-up appointment in: 6 months    Your physician has recommended you make the following change in your medication:   Increase Cozaar to 100 mg daily   Increase Hydralazine from twice a day to three times a day

## 2013-10-10 ENCOUNTER — Other Ambulatory Visit: Payer: Self-pay | Admitting: Internal Medicine

## 2013-10-10 MED ORDER — HYDROCHLOROTHIAZIDE 25 MG PO TABS: 25.0000 mg | ORAL_TABLET | Freq: Every morning | ORAL | Status: DC

## 2013-10-10 NOTE — Progress Notes (Signed)
  INTERNAL MEDICINE RESIDENCY PROGRAM After-Hours Telephone Call    Reason for call:   I received a call from Mr. [name] Sr. at 1:04 PM, 10/10/2013 indicating requesting for refill of HCTZ. Her requests for 90 days supply     Pertinent Data:  Takes this medication     Assessment / Plan / Recommendations:   Called in 90 days of HCTZ 25 mg daily. No refills.     Jessee Avers, MD   10/10/2013, 1:04 PM

## 2013-10-16 ENCOUNTER — Ambulatory Visit (INDEPENDENT_AMBULATORY_CARE_PROVIDER_SITE_OTHER): Payer: PRIVATE HEALTH INSURANCE | Admitting: Internal Medicine

## 2013-10-16 VITALS — BP 200/90 | HR 70 | Temp 98.1°F | Ht 72.0 in | Wt 221.1 lb

## 2013-10-16 DIAGNOSIS — I10 Essential (primary) hypertension: Secondary | ICD-10-CM

## 2013-10-16 DIAGNOSIS — E119 Type 2 diabetes mellitus without complications: Secondary | ICD-10-CM

## 2013-10-16 DIAGNOSIS — N049 Nephrotic syndrome with unspecified morphologic changes: Secondary | ICD-10-CM

## 2013-10-16 DIAGNOSIS — R1110 Vomiting, unspecified: Secondary | ICD-10-CM

## 2013-10-16 LAB — CBC WITH DIFFERENTIAL/PLATELET
BASOS PCT: 1 % (ref 0–1)
Basophils Absolute: 0 10*3/uL (ref 0.0–0.1)
EOS ABS: 0.2 10*3/uL (ref 0.0–0.7)
Eosinophils Relative: 5 % (ref 0–5)
HCT: 31 % — ABNORMAL LOW (ref 39.0–52.0)
Hemoglobin: 10.4 g/dL — ABNORMAL LOW (ref 13.0–17.0)
LYMPHS ABS: 1.6 10*3/uL (ref 0.7–4.0)
Lymphocytes Relative: 35 % (ref 12–46)
MCH: 27.4 pg (ref 26.0–34.0)
MCHC: 33.5 g/dL (ref 30.0–36.0)
MCV: 81.6 fL (ref 78.0–100.0)
Monocytes Absolute: 0.5 10*3/uL (ref 0.1–1.0)
Monocytes Relative: 11 % (ref 3–12)
Neutro Abs: 2.2 10*3/uL (ref 1.7–7.7)
Neutrophils Relative %: 48 % (ref 43–77)
PLATELETS: 311 10*3/uL (ref 150–400)
RBC: 3.8 MIL/uL — ABNORMAL LOW (ref 4.22–5.81)
RDW: 14.4 % (ref 11.5–15.5)
WBC: 4.5 10*3/uL (ref 4.0–10.5)

## 2013-10-16 LAB — COMPREHENSIVE METABOLIC PANEL
ALBUMIN: 3.7 g/dL (ref 3.5–5.2)
ALK PHOS: 52 U/L (ref 39–117)
ALT: 14 U/L (ref 0–53)
AST: 21 U/L (ref 0–37)
BUN: 27 mg/dL — AB (ref 6–23)
CALCIUM: 8.9 mg/dL (ref 8.4–10.5)
CHLORIDE: 101 meq/L (ref 96–112)
CO2: 29 mEq/L (ref 19–32)
Creat: 1.53 mg/dL — ABNORMAL HIGH (ref 0.50–1.35)
Glucose, Bld: 85 mg/dL (ref 70–99)
Potassium: 3.7 mEq/L (ref 3.5–5.3)
SODIUM: 139 meq/L (ref 135–145)
TOTAL PROTEIN: 6 g/dL (ref 6.0–8.3)
Total Bilirubin: 0.3 mg/dL (ref 0.2–1.2)

## 2013-10-16 LAB — POCT GLYCOSYLATED HEMOGLOBIN (HGB A1C): Hemoglobin A1C: 6

## 2013-10-16 LAB — GLUCOSE, CAPILLARY: GLUCOSE-CAPILLARY: 83 mg/dL (ref 70–99)
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
## 2013-10-16 NOTE — Patient Instructions (Addendum)
General Instructions: We will need you to return to the clinic in 1 week to check your blood pressure. It is important to take all the medications as prescribed before your visit as well and bring the bottles with you. We will contact you if there is a concern about your blood work.    Treatment Goals:  Goals (1 Years of Data) as of 10/16/13         As of Today As of Today 09/27/13 08/25/13 08/25/13     Blood Pressure    . Blood Pressure < 130/80  200/90 195/96 180/90 189/87 186/69     Result Component    . HEMOGLOBIN A1C < 7.0  6.0        . LDL CALC < 70            Progress Toward Treatment Goals:  Treatment Goal 10/16/2013  Hemoglobin A1C at goal  Blood pressure deteriorated    Self Care Goals & Plans:  Self Care Goal 10/16/2013  Manage my medications take my medicines as prescribed; bring my medications to every visit; refill my medications on time; follow the sick day instructions if I am sick  Monitor my health bring my glucose meter and log to each visit  Eat healthy foods drink diet soda or water instead of juice or soda; eat more vegetables; eat foods that are low in salt; eat baked foods instead of fried foods; eat fruit for snacks and desserts  Be physically active -  Meeting treatment goals -    Home Blood Glucose Monitoring 08/01/2013  Check my blood sugar once a day  When to check my blood sugar before breakfast     Care Management & Community Referrals:  Referral 08/12/2013  Referrals made for care management support none needed  Referrals made to community resources -
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
## 2013-10-16 NOTE — Assessment & Plan Note (Signed)
BP Readings from Last 3 Encounters:  10/16/13 200/90  09/27/13 180/90  08/25/13 189/87    Lab Results  Component Value Date   NA 138 08/25/2013   K 4.1 08/25/2013   CREATININE 1.90* 08/25/2013    Assessment: Blood pressure control: severely elevated Progress toward BP goal:  deteriorated Comments: forgot to take medications today  Plan: Medications:  continue current medications Educational resources provided: brochure Self management tools provided:   Other plans: f/u 1 week after taking all medications, instructed to bring bottles as well

## 2013-10-16 NOTE — Assessment & Plan Note (Signed)
Lab Results  Component Value Date   HGBA1C 6.0 10/16/2013   HGBA1C 6.5 06/19/2013   HGBA1C 6.0 02/06/2013     Assessment: Diabetes control: good control (HgbA1C at goal) Progress toward A1C goal:  at goal Comments:   Plan: Medications:  continue current medications Home glucose monitoring: Frequency:   Timing:   Instruction/counseling given: reminded to bring medications to each visit Educational resources provided: brochure Self management tools provided:   Other plans: cont metformin 500 mg bid

## 2013-10-17 LAB — URINALYSIS, ROUTINE W REFLEX MICROSCOPIC
BILIRUBIN URINE: NEGATIVE
Glucose, UA: NEGATIVE mg/dL
HGB URINE DIPSTICK: NEGATIVE
KETONES UR: NEGATIVE mg/dL
Leukocytes, UA: NEGATIVE
Nitrite: NEGATIVE
Protein, ur: >300 mg/dL — AB
SPECIFIC GRAVITY, URINE: 1.016 (ref 1.005–1.030)
UROBILINOGEN UA: 0.2 mg/dL (ref 0.0–1.0)
pH: 5 (ref 5.0–8.0)

## 2013-10-17 LAB — URINALYSIS, MICROSCOPIC ONLY
Bacteria, UA: NONE SEEN
Casts: NONE SEEN
Crystals: NONE SEEN
Squamous Epithelial / LPF: NONE SEEN

## 2013-10-17 NOTE — Progress Notes (Signed)
Case discussed with Dr. Schooler at the time of the visit.  We reviewed the resident's history and exam and pertinent patient test results.  I agree with the assessment, diagnosis, and plan of care documented in the resident's note.     

## 2013-10-23 ENCOUNTER — Encounter: Payer: Self-pay | Admitting: Internal Medicine

## 2013-10-23 ENCOUNTER — Ambulatory Visit (INDEPENDENT_AMBULATORY_CARE_PROVIDER_SITE_OTHER): Payer: PRIVATE HEALTH INSURANCE | Admitting: Internal Medicine

## 2013-10-23 VITALS — BP 162/82 | HR 65 | Temp 97.5°F | Ht 72.0 in | Wt 218.6 lb

## 2013-10-23 DIAGNOSIS — I10 Essential (primary) hypertension: Secondary | ICD-10-CM

## 2013-10-23 DIAGNOSIS — E119 Type 2 diabetes mellitus without complications: Secondary | ICD-10-CM

## 2013-10-23 LAB — GLUCOSE, CAPILLARY: Glucose-Capillary: 79 mg/dL (ref 70–99)

## 2013-10-23 MED ORDER — LOSARTAN POTASSIUM 50 MG PO TABS: 100.0000 mg | ORAL_TABLET | Freq: Every morning | ORAL | Status: DC

## 2013-10-23 NOTE — Patient Instructions (Signed)
General Instructions: Start taking 2 pills of the losartan medicine for a total of 100 mg. Continue taking the rest of your pills as before. Keep your appointment with your kidney doctor. Follow-up in 2 weeks for another blood pressure check. Please try to bring all your medicines next time. This helps [name] take good care of you and stops mistakes from medicines that could hurt you.   Treatment Goals:  Goals (1 Years of Data) as of 10/23/13         As of Today As of Today 10/16/13 10/16/13 09/27/13     Blood Pressure    . Blood Pressure < 130/80  162/82 170/77 200/90 195/96 180/90     Result Component    . HEMOGLOBIN A1C < 7.0    6.0      . LDL CALC < 70            Progress Toward Treatment Goals:  Treatment Goal 10/23/2013  Hemoglobin A1C -  Blood pressure improved    Self Care Goals & Plans:  Self Care Goal 10/23/2013  Manage my medications take my medicines as prescribed; bring my medications to every visit; refill my medications on time  Monitor my health bring my glucose meter and log to each visit  Eat healthy foods drink diet soda or water instead of juice or soda; eat more vegetables; eat foods that are low in salt; eat baked foods instead of fried foods; eat fruit for snacks and desserts  Be physically active -  Meeting treatment goals -    Home Blood Glucose Monitoring 08/01/2013  Check my blood sugar once a day  When to check my blood sugar before breakfast     Care Management & Community Referrals:  Referral 08/12/2013  Referrals made for care management support none needed  Referrals made to community resources -

## 2013-10-23 NOTE — Assessment & Plan Note (Addendum)
BP Readings from Last 3 Encounters:  10/23/13 162/82  10/16/13 200/90  09/27/13 180/90    Lab Results  Component Value Date   NA 139 10/16/2013   K 3.7 10/16/2013   CREATININE 1.53* 10/16/2013    Assessment: Blood pressure control: mildly elevated Progress toward BP goal:  improved Comments: had not started taking losartan 100 mg as recommend by Dr. Vonzella Nipple, didn't bring medication bottles today  Plan: Medications:  continue current medications Educational resources provided: brochure Self management tools provided:   Other plans: will increase losartan to 100 mg qd (take 2 of the 50 mg pills in the morning) -cont carvedilol 25 mg bid, clonidine 0.3 bid, hydralazine 50 mg tid, hctz 25 mg qd, labetolol 300 mg qd -f/u 2 weeks for recheck

## 2013-10-23 NOTE — Progress Notes (Signed)
   Subjective:    Patient ID: Charles Hart., male    DOB: Jun 17, 1956, 58 y.o.   MRN: [id number]  HPI  Presents for bp recheck.  Regimen consists of carvedilol 25 mg bid, clonidine 0.3 bid, hydralazine 50 mg tid, hctz 25 mg qd, labetolol 300 mg qd, and losartan 50 mg qd.  Hx significant for CKD followed by Renal Ledell Noss, [place]) likely secondary to hypertension and diabetes, and diastolic CHF.  Review of Systems  Constitutional: Negative.   HENT: Negative.   Eyes: Negative.   Respiratory: Negative.   Cardiovascular: Negative.   Gastrointestinal: Negative.   Endocrine: Negative.   Genitourinary: Negative.   Neurological: Negative.   Hematological: Does not bruise/bleed easily.  Psychiatric/Behavioral: Negative.        Objective:   Physical Exam  Constitutional: He appears well-developed and well-nourished. No distress.  HENT:  Head: Normocephalic and atraumatic.  Eyes: Conjunctivae and EOM are normal. Pupils are equal, round, and reactive to light.  Neck: Normal range of motion. Neck supple.  Cardiovascular: Normal rate, regular rhythm, normal heart sounds and intact distal pulses.   Pulmonary/Chest: Effort normal and breath sounds normal.  Abdominal: Soft. Bowel sounds are normal.  Musculoskeletal: Normal range of motion. He exhibits no edema.  Neurological: He is alert.  Skin: Skin is warm and dry.  Psychiatric: He has a normal mood and affect.          Assessment & Plan:  See separate problem-list charting:

## 2013-10-24 NOTE — Progress Notes (Signed)
Case discussed with Dr. Schooler at the time of the visit.  We reviewed the resident's history and exam and pertinent patient test results.  I agree with the assessment, diagnosis, and plan of care documented in the resident's note.     

## 2013-11-21 ENCOUNTER — Other Ambulatory Visit: Payer: Self-pay

## 2013-12-11 ENCOUNTER — Telehealth: Payer: Self-pay | Admitting: *Deleted

## 2013-12-11 NOTE — Telephone Encounter (Signed)
Please find out which order I should use, and whether they can use an audiogram that we order and perform in our system.  I believe the procedure in our system is "Audiometry with tympanometry", and we would refer to physical therapy to get it done.

## 2013-12-11 NOTE — Telephone Encounter (Signed)
Received a call from [facility] in [place] [id number] Pt is interested in hearing aid and the process needs to go through a middle man "[facility]". She is requesting an order for an Audiogram, Dx; hearing loss She is hoping insurance will pay for this.  Fax : [phone]

## 2013-12-13 NOTE — Telephone Encounter (Signed)
I talked with Charles Hart again and you can write an order for "Audiogram ( Dx Hearing loss ) " on your Rx pad and I will fax to her.  Seems like it is easier to get it all done at one place. I will fax over to her. If you want it done at [facility], you can send the results to her  and get she will get the hearing aid.  Cost should be the same.

## 2013-12-16 NOTE — Telephone Encounter (Signed)
Rx faxed

## 2014-01-15 ENCOUNTER — Ambulatory Visit: Payer: PRIVATE HEALTH INSURANCE | Admitting: Internal Medicine

## 2014-01-20 ENCOUNTER — Inpatient Hospital Stay (HOSPITAL_COMMUNITY): Payer: PRIVATE HEALTH INSURANCE

## 2014-01-20 ENCOUNTER — Encounter (HOSPITAL_COMMUNITY): Payer: Self-pay | Admitting: Emergency Medicine

## 2014-01-20 ENCOUNTER — Inpatient Hospital Stay (HOSPITAL_COMMUNITY)
Admission: EM | Admit: 2014-01-20 | Discharge: 2014-01-21 | DRG: 073 | Disposition: A | Discharge status: Home / Self Care | Payer: PRIVATE HEALTH INSURANCE | Attending: Internal Medicine | Admitting: Internal Medicine

## 2014-01-20 DIAGNOSIS — Z7902 Long term (current) use of antithrombotics/antiplatelets: Secondary | ICD-10-CM

## 2014-01-20 DIAGNOSIS — Z800 Family history of malignant neoplasm of digestive organs: Secondary | ICD-10-CM

## 2014-01-20 DIAGNOSIS — H9190 Unspecified hearing loss, unspecified ear: Secondary | ICD-10-CM | POA: Diagnosis present

## 2014-01-20 DIAGNOSIS — I5042 Chronic combined systolic (congestive) and diastolic (congestive) heart failure: Secondary | ICD-10-CM | POA: Diagnosis present

## 2014-01-20 DIAGNOSIS — N183 Chronic kidney disease, stage 3 unspecified: Secondary | ICD-10-CM | POA: Diagnosis present

## 2014-01-20 DIAGNOSIS — B9789 Other viral agents as the cause of diseases classified elsewhere: Secondary | ICD-10-CM | POA: Diagnosis present

## 2014-01-20 DIAGNOSIS — J069 Acute upper respiratory infection, unspecified: Secondary | ICD-10-CM | POA: Diagnosis present

## 2014-01-20 DIAGNOSIS — E1149 Type 2 diabetes mellitus with other diabetic neurological complication: Principal | ICD-10-CM | POA: Diagnosis present

## 2014-01-20 DIAGNOSIS — E1122 Type 2 diabetes mellitus with diabetic chronic kidney disease: Secondary | ICD-10-CM | POA: Diagnosis present

## 2014-01-20 DIAGNOSIS — Z794 Long term (current) use of insulin: Secondary | ICD-10-CM

## 2014-01-20 DIAGNOSIS — I2510 Atherosclerotic heart disease of native coronary artery without angina pectoris: Secondary | ICD-10-CM | POA: Diagnosis present

## 2014-01-20 DIAGNOSIS — I509 Heart failure, unspecified: Secondary | ICD-10-CM | POA: Diagnosis present

## 2014-01-20 DIAGNOSIS — N170 Acute kidney failure with tubular necrosis: Secondary | ICD-10-CM | POA: Diagnosis present

## 2014-01-20 DIAGNOSIS — I428 Other cardiomyopathies: Secondary | ICD-10-CM | POA: Diagnosis present

## 2014-01-20 DIAGNOSIS — I5032 Chronic diastolic (congestive) heart failure: Secondary | ICD-10-CM | POA: Diagnosis present

## 2014-01-20 DIAGNOSIS — I129 Hypertensive chronic kidney disease with stage 1 through stage 4 chronic kidney disease, or unspecified chronic kidney disease: Secondary | ICD-10-CM | POA: Diagnosis present

## 2014-01-20 DIAGNOSIS — Z8673 Personal history of transient ischemic attack (TIA), and cerebral infarction without residual deficits: Secondary | ICD-10-CM

## 2014-01-20 DIAGNOSIS — E785 Hyperlipidemia, unspecified: Secondary | ICD-10-CM | POA: Diagnosis present

## 2014-01-20 DIAGNOSIS — I10 Essential (primary) hypertension: Secondary | ICD-10-CM | POA: Diagnosis present

## 2014-01-20 DIAGNOSIS — Z79899 Other long term (current) drug therapy: Secondary | ICD-10-CM

## 2014-01-20 DIAGNOSIS — E1142 Type 2 diabetes mellitus with diabetic polyneuropathy: Secondary | ICD-10-CM | POA: Diagnosis present

## 2014-01-20 DIAGNOSIS — R739 Hyperglycemia, unspecified: Secondary | ICD-10-CM | POA: Diagnosis present

## 2014-01-20 DIAGNOSIS — E860 Dehydration: Secondary | ICD-10-CM | POA: Diagnosis present

## 2014-01-20 LAB — I-STAT CHEM 8, ED
BUN: 41 mg/dL — ABNORMAL HIGH (ref 6–23)
Calcium, Ion: 1.16 mmol/L (ref 1.12–1.23)
Chloride: 89 mEq/L — ABNORMAL LOW (ref 96–112)
Creatinine, Ser: 2.8 mg/dL — ABNORMAL HIGH (ref 0.50–1.35)
Glucose, Bld: 394 mg/dL — ABNORMAL HIGH (ref 70–99)
HEMATOCRIT: 36 % — AB (ref 39.0–52.0)
HEMOGLOBIN: 12.2 g/dL — AB (ref 13.0–17.0)
Potassium: 3.7 mEq/L (ref 3.7–5.3)
Sodium: 133 mEq/L — ABNORMAL LOW (ref 137–147)
TCO2: 30 mmol/L (ref 0–100)

## 2014-01-20 LAB — CBC
HCT: 34.3 % — ABNORMAL LOW (ref 39.0–52.0)
Hemoglobin: 11.4 g/dL — ABNORMAL LOW (ref 13.0–17.0)
MCH: 27.2 pg (ref 26.0–34.0)
MCHC: 33.2 g/dL (ref 30.0–36.0)
MCV: 81.9 fL (ref 78.0–100.0)
PLATELETS: 224 10*3/uL (ref 150–400)
RBC: 4.19 MIL/uL — ABNORMAL LOW (ref 4.22–5.81)
RDW: 12.9 % (ref 11.5–15.5)
WBC: 5.8 10*3/uL (ref 4.0–10.5)

## 2014-01-20 LAB — URINE MICROSCOPIC-ADD ON

## 2014-01-20 LAB — COMPREHENSIVE METABOLIC PANEL
ALBUMIN: 3.5 g/dL (ref 3.5–5.2)
ALK PHOS: 75 U/L (ref 39–117)
ALT: 12 U/L (ref 0–53)
AST: 14 U/L (ref 0–37)
BUN: 43 mg/dL — ABNORMAL HIGH (ref 6–23)
CO2: 29 mEq/L (ref 19–32)
Calcium: 9.1 mg/dL (ref 8.4–10.5)
Chloride: 87 mEq/L — ABNORMAL LOW (ref 96–112)
Creatinine, Ser: 2.61 mg/dL — ABNORMAL HIGH (ref 0.50–1.35)
GFR calc Af Amer: 30 mL/min — ABNORMAL LOW (ref >90)
GFR calc non Af Amer: 26 mL/min — ABNORMAL LOW (ref >90)
Glucose, Bld: 383 mg/dL — ABNORMAL HIGH (ref 70–99)
POTASSIUM: 3.8 meq/L (ref 3.7–5.3)
Sodium: 130 mEq/L — ABNORMAL LOW (ref 137–147)
TOTAL PROTEIN: 7.4 g/dL (ref 6.0–8.3)
Total Bilirubin: 0.5 mg/dL (ref 0.3–1.2)

## 2014-01-20 LAB — CBG MONITORING, ED
Glucose-Capillary: 403 mg/dL — ABNORMAL HIGH (ref 70–99)
Glucose-Capillary: 459 mg/dL — ABNORMAL HIGH (ref 70–99)

## 2014-01-20 LAB — URINALYSIS, ROUTINE W REFLEX MICROSCOPIC
Bilirubin Urine: NEGATIVE
HGB URINE DIPSTICK: NEGATIVE
KETONES UR: NEGATIVE mg/dL
LEUKOCYTES UA: NEGATIVE
Nitrite: NEGATIVE
Protein, ur: 100 mg/dL — AB
Specific Gravity, Urine: 1.017 (ref 1.005–1.030)
Urobilinogen, UA: 0.2 mg/dL (ref 0.0–1.0)
pH: 5 (ref 5.0–8.0)

## 2014-01-20 MED ORDER — CLOPIDOGREL BISULFATE 75 MG PO TABS
75.0000 mg | ORAL_TABLET | Freq: Every morning | ORAL | Status: DC
Start: 2014-01-21 — End: 2014-01-21
  Administered 2014-01-21: 75 mg via ORAL
  Filled 2014-01-20: qty 1

## 2014-01-20 MED ORDER — INSULIN ASPART 100 UNIT/ML ~~LOC~~ SOLN
10.0000 [IU] | Freq: Once | SUBCUTANEOUS | Status: AC
  Administered 2014-01-20: 10 [IU] via SUBCUTANEOUS
  Filled 2014-01-20: qty 1

## 2014-01-20 MED ORDER — INSULIN ASPART 100 UNIT/ML ~~LOC~~ SOLN
0.0000 [IU] | SUBCUTANEOUS | Status: DC
  Administered 2014-01-21: 9 [IU] via SUBCUTANEOUS
  Administered 2014-01-21: 2 [IU] via SUBCUTANEOUS
  Administered 2014-01-21: 3 [IU] via SUBCUTANEOUS

## 2014-01-20 MED ORDER — FLUTICASONE PROPIONATE 50 MCG/ACT NA SUSP: 2.0000 | Freq: Every day | NASAL | Status: DC | PRN

## 2014-01-20 MED ORDER — SIMVASTATIN 20 MG PO TABS
20.0000 mg | ORAL_TABLET | Freq: Every day | ORAL | Status: DC
  Filled 2014-01-20: qty 1

## 2014-01-20 MED ORDER — POTASSIUM CHLORIDE CRYS ER 20 MEQ PO TBCR
40.0000 meq | EXTENDED_RELEASE_TABLET | Freq: Once | ORAL | Status: AC
  Administered 2014-01-21: 40 meq via ORAL
  Filled 2014-01-20: qty 2

## 2014-01-20 MED ORDER — CLONIDINE HCL 0.3 MG PO TABS
0.3000 mg | ORAL_TABLET | Freq: Two times a day (BID) | ORAL | Status: DC
  Administered 2014-01-21 (×2): 0.3 mg via ORAL
  Filled 2014-01-20 (×3): qty 1

## 2014-01-20 MED ORDER — HYDRALAZINE HCL 50 MG PO TABS
100.0000 mg | ORAL_TABLET | Freq: Three times a day (TID) | ORAL | Status: DC
  Administered 2014-01-21 (×2): 100 mg via ORAL
  Filled 2014-01-20 (×4): qty 2

## 2014-01-20 MED ORDER — SODIUM CHLORIDE 0.9 % IV SOLN
INTRAVENOUS | Status: DC
  Administered 2014-01-21 (×2): via INTRAVENOUS

## 2014-01-20 MED ORDER — HEPARIN SODIUM (PORCINE) 5000 UNIT/ML IJ SOLN
5000.0000 [IU] | Freq: Three times a day (TID) | INTRAMUSCULAR | Status: DC
  Administered 2014-01-21: 5000 [IU] via SUBCUTANEOUS
  Filled 2014-01-20 (×4): qty 1

## 2014-01-20 MED ORDER — SODIUM CHLORIDE 0.9 % IV BOLUS (SEPSIS)
1500.0000 mL | Freq: Once | INTRAVENOUS | Status: AC
  Administered 2014-01-21: 1500 mL via INTRAVENOUS

## 2014-01-20 MED ORDER — INSULIN GLARGINE 100 UNIT/ML ~~LOC~~ SOLN
15.0000 [IU] | Freq: Every day | SUBCUTANEOUS | Status: DC
  Administered 2014-01-21: 15 [IU] via SUBCUTANEOUS
  Filled 2014-01-20 (×2): qty 0.15

## 2014-01-20 MED ORDER — LABETALOL HCL 300 MG PO TABS
300.0000 mg | ORAL_TABLET | Freq: Two times a day (BID) | ORAL | Status: DC
  Administered 2014-01-21 (×2): 300 mg via ORAL
  Filled 2014-01-20 (×3): qty 1

## 2014-01-20 MED ORDER — GABAPENTIN 300 MG PO CAPS
300.0000 mg | ORAL_CAPSULE | Freq: Every morning | ORAL | Status: DC
  Administered 2014-01-21: 300 mg via ORAL
  Filled 2014-01-20: qty 1

## 2014-01-20 MED ORDER — DIGOXIN 250 MCG PO TABS
0.2500 mg | ORAL_TABLET | Freq: Every morning | ORAL | Status: DC
  Administered 2014-01-21: 0.25 mg via ORAL
  Filled 2014-01-20: qty 1

## 2014-01-20 MED ORDER — SODIUM CHLORIDE 0.9 % IV BOLUS (SEPSIS)
500.0000 mL | Freq: Once | INTRAVENOUS | Status: AC
  Administered 2014-01-20: 500 mL via INTRAVENOUS

## 2014-01-20 NOTE — ED Provider Notes (Signed)
CSN: [id number]     Arrival date & time 01/20/14  1646 History   First MD Initiated Contact with Patient 01/20/14 2104     Chief Complaint  Patient presents with  . Hyperglycemia     (Consider location/radiation/quality/duration/timing/severity/associated sxs/prior Treatment) Patient is a 58 y.o. male presenting with general illness. The history is provided by the patient.  Illness Severity:  Moderate Onset quality:  Gradual Timing:  Constant Progression:  Worsening Chronicity:  New Associated symptoms: congestion and cough   Associated symptoms: no abdominal pain, no chest pain, no diarrhea, no fever, no headaches, no nausea, no rash, no rhinorrhea, no shortness of breath and no vomiting     58 yo male pw hyperglycemia and hypertension.  H/o diabetes. Medication compliance. No change in insulin dosing. Past few days with glucose ~400's. Reports some congestion and dry cough. Wife with recent URI. Pt feels as though he has it now. No fevers. Cough non-productive. No cp or sob. No abd pain, n/v. No urinary changes. No decreased uop, dysuria or hematuria.  Chronic constipation, not changed. No diarrhea. Occasional cramps to BLE. No swelling or other pain though.  BP around 150's at home. No recent change to BP meds, but says that his kidney doctor occasionally changes them since last fall. Uncertain the last change.  No headache.   Past Medical History  Diagnosis Date  . Dermatitis   . CHF (congestive heart failure)     LV function improved from 2004 to 2008.  Historically, moderately dilated LV with EF 30-40% by 2D echo 08/14/2002.  Mild CAD with severe LV dysfunction by cardiac cath 09/2002.  Normal coronary arteries and normal LV function by cardiac cath 09/19/2006.  A 2-D echo on 04/01/2009 showed mild concentric hypertrophy and normal systolic (LVEF  32-95%) and doppler C/W with grade 1 diastolic dysfunction.  . Hypertension   . Diabetes mellitus   . Hyperlipidemia   . Hearing loss  in right ear   . Cardiomyopathy     LV function improved from 2004 to 2008.  Historically, moderately dilated LV with EF 30-40% by 2D echo 08/14/2002.  Mild CAD with severe LV dysfunction by cardiac cath 09/2002.  Normal coronary arteries and normal LV function by cardiac cath 09/19/2006.  A 2-D echo on 04/01/2009 showed mild concentric hypertrophy and normal systolic (LVEF  18-84%) and doppler C/W with grade 1 diastolic dysfunction.  . DM neuropathy, painful   . CVA (cerebral vascular accident) 07/04/2012    MRI of the brain 07/04/2012 showed an acute infarct in the right basal ganglia involving the anterior putamen, anterior limb internal capsule, and head of the caudate; this measured approximately 2.5 cm in diameter.     . Hypertensive crisis 07/28/2012   Past Surgical History  Procedure Laterality Date  . Colonoscopy    . Polypectomy    . Cardiac catheterization      3 times  . Foot surgery     Family History  Problem Relation Age of Onset  . Colon cancer Sister   . Aneurysm Father 44    died of rupture   History  Substance Use Topics  . Smoking status: Never Smoker   . Smokeless tobacco: Never Used  . Alcohol Use: Yes     Comment: Wine occasionally (no more than 2 glasses per month)    Review of Systems  Constitutional: Negative for fever and chills.  HENT: Positive for congestion. Negative for rhinorrhea.   Respiratory: Positive for cough. Negative for  shortness of breath.   Cardiovascular: Negative for chest pain and leg swelling.  Gastrointestinal: Negative for nausea, vomiting, abdominal pain and diarrhea.  Genitourinary: Negative for dysuria, frequency, flank pain and difficulty urinating.  Musculoskeletal: Negative for back pain.  Skin: Negative for color change and rash.  Neurological: Negative for dizziness and headaches.  All other systems reviewed and are negative.     Allergies  Amlodipine and Lisinopril  Home Medications   Prior to Admission  medications   Medication Sig Start Date End Date Taking? Authorizing Provider  ACCU-CHEK FASTCLIX LANCETS MISC Check blood sugar as directed up to 3 times a day dx code 250.00 09/05/13   Axel Filler, MD  Blood Glucose Monitoring Suppl (ACCU-CHEK NANO SMARTVIEW) W/DEVICE KIT Check blood sugar as directed up to 3 times a day dx code 250.00 09/05/13   Axel Filler, MD  carvedilol (COREG) 25 MG tablet Take 25 mg by mouth 2 (two) times daily.    Historical Provider, MD  cloNIDine (CATAPRES) 0.3 MG tablet Take 0.3 mg by mouth 2 (two) times daily.    Historical Provider, MD  clopidogrel (PLAVIX) 75 MG tablet Take 75 mg by mouth every morning.    Historical Provider, MD  digoxin (LANOXIN) 0.25 MG tablet Take 0.25 mg by mouth every morning.    Historical Provider, MD  fluticasone (FLONASE) 50 MCG/ACT nasal spray Place 2 sprays into both nostrils daily. 07/01/13   Historical Provider, MD  furosemide (LASIX) 40 MG tablet Take 1.5 tablets (60 mg total) by mouth daily. 09/01/13   Axel Filler, MD  gabapentin (NEURONTIN) 300 MG capsule Take 300 mg by mouth every morning.    Historical Provider, MD  glucose blood (ACCU-CHEK SMARTVIEW) test strip Use as instructed to check blood sugar up to 2 times daily.  Dx code 250.00 02/23/11 02/23/12  Axel Filler, MD  glucose blood (ACCU-CHEK SMARTVIEW) test strip Check blood sugar as directed up to 3 times a day dx code 250.00 09/05/13   Axel Filler, MD  hydrALAZINE (APRESOLINE) 50 MG tablet Take 2 tablets (100 mg total) by mouth 3 (three) times daily. 09/27/13   Josue Hector, MD  hydrochlorothiazide (HYDRODIURIL) 25 MG tablet Take 1 tablet (25 mg total) by mouth every morning. 10/10/13   Jessee Avers, MD  Insulin Glargine (LANTUS SOLOSTAR) 100 UNIT/ML SOPN Inject 15 Units into the skin at bedtime. 07/17/13   Axel Filler, MD  labetalol (NORMODYNE) 300 MG tablet Take 1 tablet (300 mg total) by mouth 2 (two) times daily. 08/12/13   Hester Mates, MD   losartan (COZAAR) 50 MG tablet Take 2 tablets (100 mg total) by mouth every morning. 10/23/13   Jeralene Huff, MD  metFORMIN (GLUCOPHAGE) 500 MG tablet Take 250 mg by mouth 2 (two) times daily.    Historical Provider, MD  Multiple Vitamin (MULITIVITAMIN WITH MINERALS) TABS Take 1 tablet by mouth every morning.     Historical Provider, MD  pravastatin (PRAVACHOL) 40 MG tablet Take 1 tablet (40 mg total) by mouth daily. 07/20/13   Axel Filler, MD   BP 151/69  Pulse 78  Temp(Src) 98.9 F (37.2 C)  Resp 20  Wt 216 lb 7 oz (98.175 kg)  SpO2 100% Physical Exam  Nursing note and vitals reviewed. Constitutional: He is oriented to person, place, and time. He appears well-developed and well-nourished. No distress.  HENT:  Head: Normocephalic and atraumatic.  Eyes: Conjunctivae are normal. Right eye exhibits no discharge.  Left eye exhibits no discharge.  Neck: No tracheal deviation present.  Cardiovascular: Normal rate, regular rhythm, normal heart sounds and intact distal pulses.   Pulmonary/Chest: Effort normal and breath sounds normal. No stridor. No respiratory distress. He has no wheezes. He has no rales.  Abdominal: Soft. He exhibits no distension. There is no tenderness. There is no guarding.  Musculoskeletal: He exhibits no edema and no tenderness.  Neurological: He is alert and oriented to person, place, and time.  Skin: Skin is warm and dry.  Psychiatric: He has a normal mood and affect. His behavior is normal.    ED Course  Procedures (including critical care time) Labs Review Labs Reviewed  CBC - Abnormal; Notable for the following:    RBC 4.19 (*)    Hemoglobin 11.4 (*)    HCT 34.3 (*)    All other components within normal limits  COMPREHENSIVE METABOLIC PANEL - Abnormal; Notable for the following:    Sodium 130 (*)    Chloride 87 (*)    Glucose, Bld 383 (*)    BUN 43 (*)    Creatinine, Ser 2.61 (*)    GFR calc non Af Amer 26 (*)    GFR calc Af Amer 30  (*)    All other components within normal limits  URINALYSIS, ROUTINE W REFLEX MICROSCOPIC - Abnormal; Notable for the following:    Glucose, UA >1000 (*)    Protein, ur 100 (*)    All other components within normal limits  URINE MICROSCOPIC-ADD ON - Abnormal; Notable for the following:    Squamous Epithelial / LPF FEW (*)    Bacteria, UA FEW (*)    Casts GRANULAR CAST (*)    All other components within normal limits  CBG MONITORING, ED - Abnormal; Notable for the following:    Glucose-Capillary 403 (*)    All other components within normal limits  I-STAT CHEM 8, ED - Abnormal; Notable for the following:    Sodium 133 (*)    Chloride 89 (*)    BUN 41 (*)    Creatinine, Ser 2.80 (*)    Glucose, Bld 394 (*)    Hemoglobin 12.2 (*)    HCT 36.0 (*)    All other components within normal limits  CBG MONITORING, ED    Imaging Review No results found.   EKG Interpretation None      MDM   Final diagnoses:  None    Hyperglycemia. AG 14. H/o CHF. Given 500cc NS bolus and 10 units sq insulin. Acute on chronic renal failure.  Int medicine consulted for admission.  CXr without pna. UA without evidence of UTI.  Reports medication compliance.  Discussed case with Dr. Alvino Chapel who is in agreement with assessment and plan.    Bonnita Hollow, MD 01/20/14 [phone]

## 2014-01-20 NOTE — ED Notes (Signed)
Pt reports runny nose, dry cough, also high blood sugars for 1 month. Denies any pain. Pt is a x 4. Respirations unlabored. Also reports his BP has been high. Has been taking meds.

## 2014-01-20 NOTE — ED Notes (Signed)
Pt is talking on the phone. Wants to finish his conversation before he talks with RN.

## 2014-01-20 NOTE — H&P (Signed)
Date: 01/20/2014               Patient Name:  Charles LAMORTE Sr. MRN: [id number]  DOB: [birth year] Age / Sex: 58 y.o., male   PCP: Axel Filler, MD         Medical Service: Internal Medicine Teaching Service         Attending Physician: Dr. Aldine Contes, MD    First Contact: Dr. Ivin Poot, MD Pager: [phone]  Second Contact: Dr. Jessee Avers, MD Pager: [phone]       After Hours (After 5p/  First Contact Pager: [phone]  weekends / holidays): Second Contact Pager: [phone]   Chief Complaint: hyperglycemia  History of Present Illness: [name] is a 58 y.o. man with a phx of DM II, htn, CKD , dCHF (I) who presents to the ED with a cc of high blood sugar. He was in his normal state of health until the 3 days when he developed congestion and cough. The cough is dry and non-productive. The patient has a positive sick contact who is his wife. She had similar symptoms of congestion. Over the following days his blood sugar continuously increased, until it read high a few times during the last 24 hours. He reports that he was compliant with his medications (15 U lantus, 250 BID metformin). His last A1C was 6.0 in 10/16/13. Admits to polyuria and polydipsia since Saturday (3 days ago).  He denies CP, SOB, Fever, chills, nausea, and vomiting.   Meds: Current Facility-Administered Medications  Medication Dose Route Frequency Provider Last Rate Last Dose  . insulin aspart (novoLOG) injection 10 Units  10 Units Subcutaneous Once Bonnita Hollow, MD      . sodium chloride 0.9 % bolus 500 mL  500 mL Intravenous Once Bonnita Hollow, MD       Current Outpatient Prescriptions  Medication Sig Dispense Refill  . ACCU-CHEK FASTCLIX LANCETS MISC Check blood sugar as directed up to 3 times a day dx code 250.00  102 each  5  . Blood Glucose Monitoring Suppl (ACCU-CHEK NANO SMARTVIEW) W/DEVICE KIT Check blood sugar as directed up to 3 times a day dx code 250.00  1 kit  0  . carvedilol  (COREG) 25 MG tablet Take 25 mg by mouth 2 (two) times daily.      . cloNIDine (CATAPRES) 0.3 MG tablet Take 0.3 mg by mouth 2 (two) times daily.      . clopidogrel (PLAVIX) 75 MG tablet Take 75 mg by mouth every morning.      . digoxin (LANOXIN) 0.25 MG tablet Take 0.25 mg by mouth every morning.      . fluticasone (FLONASE) 50 MCG/ACT nasal spray Place 2 sprays into both nostrils daily.      . furosemide (LASIX) 40 MG tablet Take 1.5 tablets (60 mg total) by mouth daily.  45 tablet  2  . gabapentin (NEURONTIN) 300 MG capsule Take 300 mg by mouth every morning.      [name] glucose blood (ACCU-CHEK SMARTVIEW) test strip Use as instructed to check blood sugar up to 2 times daily.  Dx code 250.00  100 each  12  . glucose blood (ACCU-CHEK SMARTVIEW) test strip Check blood sugar as directed up to 3 times a day dx code 250.00  100 each  5  . hydrALAZINE (APRESOLINE) 50 MG tablet Take 2 tablets (100 mg total) by mouth 3 (three) times daily.  90 tablet  6  . hydrochlorothiazide (HYDRODIURIL)  25 MG tablet Take 1 tablet (25 mg total) by mouth every morning.  90 tablet  0  . Insulin Glargine (LANTUS SOLOSTAR) 100 UNIT/ML SOPN Inject 15 Units into the skin at bedtime.  15 mL  3  . labetalol (NORMODYNE) 300 MG tablet Take 1 tablet (300 mg total) by mouth 2 (two) times daily.  180 tablet  3  . losartan (COZAAR) 50 MG tablet Take 2 tablets (100 mg total) by mouth every morning.  60 tablet  3  . metFORMIN (GLUCOPHAGE) 500 MG tablet Take 250 mg by mouth 2 (two) times daily.      . Multiple Vitamin (MULITIVITAMIN WITH MINERALS) TABS Take 1 tablet by mouth every morning.       . pravastatin (PRAVACHOL) 40 MG tablet Take 1 tablet (40 mg total) by mouth daily.  90 tablet  1    Allergies: Allergies as of 01/20/2014 - Review Complete 01/20/2014  Allergen Reaction Noted  . Amlodipine Swelling 07/10/2013  . Lisinopril Cough 06/19/2013   Past Medical History  Diagnosis Date  . Dermatitis   . CHF (congestive heart  failure)     LV function improved from 2004 to 2008.  Historically, moderately dilated LV with EF 30-40% by 2D echo 08/14/2002.  Mild CAD with severe LV dysfunction by cardiac cath 09/2002.  Normal coronary arteries and normal LV function by cardiac cath 09/19/2006.  A 2-D echo on 04/01/2009 showed mild concentric hypertrophy and normal systolic (LVEF  16-10%) and doppler C/W with grade 1 diastolic dysfunction.  . Hypertension   . Diabetes mellitus   . Hyperlipidemia   . Hearing loss in right ear   . Cardiomyopathy     LV function improved from 2004 to 2008.  Historically, moderately dilated LV with EF 30-40% by 2D echo 08/14/2002.  Mild CAD with severe LV dysfunction by cardiac cath 09/2002.  Normal coronary arteries and normal LV function by cardiac cath 09/19/2006.  A 2-D echo on 04/01/2009 showed mild concentric hypertrophy and normal systolic (LVEF  96-04%) and doppler C/W with grade 1 diastolic dysfunction.  . DM neuropathy, painful   . CVA (cerebral vascular accident) 07/04/2012    MRI of the brain 07/04/2012 showed an acute infarct in the right basal ganglia involving the anterior putamen, anterior limb internal capsule, and head of the caudate; this measured approximately 2.5 cm in diameter.     . Hypertensive crisis 07/28/2012   Past Surgical History  Procedure Laterality Date  . Colonoscopy    . Polypectomy    . Cardiac catheterization      3 times  . Foot surgery     Family History  Problem Relation Age of Onset  . Colon cancer Sister   . Aneurysm Father 53    died of rupture   History   Social History  . Marital Status: Married    Spouse Name: N/A    Number of Children: N/A  . Years of Education: N/A   Occupational History  . Not on file.   Social History Main Topics  . Smoking status: Never Smoker   . Smokeless tobacco: Never Used  . Alcohol Use: Yes     Comment: Wine occasionally (no more than 2 glasses per month)  . Drug Use: No  . Sexual Activity: Not on file    Other Topics Concern  . Not on file   Social History Narrative  . No narrative on file    Review of Systems: Pertinent items are noted in  HPI.  Physical Exam: Blood pressure 157/78, pulse 78, temperature 98.5 F (36.9 C), temperature source Oral, resp. rate 20, weight 216 lb 7 oz (98.175 kg), SpO2 98.00%. Physical Exam  Constitutional: He is oriented to person, place, and time. He appears well-developed and well-nourished. No distress.  HENT:  Dry mucous membranes  Eyes: EOM are normal. Pupils are equal, round, and reactive to light.  Cardiovascular: Normal rate, regular rhythm, normal heart sounds and intact distal pulses.  Exam reveals no friction rub.   No murmur heard. Pulmonary/Chest: Effort normal and breath sounds normal. No respiratory distress. He has no wheezes. He has no rales.  Abdominal: Soft. Bowel sounds are normal. He exhibits no distension. There is no tenderness. There is no rebound and no guarding.  Musculoskeletal: He exhibits no edema and no tenderness.  Neurological: He is alert and oriented to person, place, and time.  Skin: He is not diaphoretic.  Psychiatric: He has a normal mood and affect. His behavior is normal.     Lab results: Basic Metabolic Panel:  Recent Labs  01/20/14 1824 01/20/14 1841  NA 130* 133*  K 3.8 3.7  CL 87* 89*  CO2 29  --   GLUCOSE 383* 394*  BUN 43* 41*  CREATININE 2.61* 2.80*  CALCIUM 9.1  --    Liver Function Tests:  Recent Labs  01/20/14 1824  AST 14  ALT 12  ALKPHOS 75  BILITOT 0.5  PROT 7.4  ALBUMIN 3.5   CBC:  Recent Labs  01/20/14 1824 01/20/14 1841  WBC 5.8  --   HGB 11.4* 12.2*  HCT 34.3* 36.0*  MCV 81.9  --   PLT 224  --    CBG:  Recent Labs  01/20/14 1812 01/20/14 2108  GLUCAP 403* 459*   Urine Drug Screen: Drugs of Abuse     Component Value Date/Time   LABOPIA NONE DETECTED 07/03/2012 2346   COCAINSCRNUR NONE DETECTED 07/03/2012 2346   LABBENZ NONE DETECTED 07/03/2012  2346   AMPHETMU NONE DETECTED 07/03/2012 2346   THCU NONE DETECTED 07/03/2012 2346   LABBARB NONE DETECTED 07/03/2012 2346    Urinalysis:  Recent Labs  01/20/14 1816  COLORURINE YELLOW  LABSPEC 1.017  PHURINE 5.0  GLUCOSEU >1000*  HGBUR NEGATIVE  BILIRUBINUR NEGATIVE  KETONESUR NEGATIVE  PROTEINUR 100*  UROBILINOGEN 0.2  NITRITE NEGATIVE  LEUKOCYTESUR NEGATIVE     Imaging results:  Dg Chest 2 View  01/20/2014   CLINICAL DATA:  Cough for 3 days.  EXAM: CHEST  2 VIEW  COMPARISON:  08/25/2013  FINDINGS: Shallow inspiration. Heart size and pulmonary vascularity are normal for technique. Prominence of the central mediastinum probably due to aortic ectasia and tortuosity combined with shallow inspiration. Appearance is unchanged since prior study. Blunting of the posterior right costophrenic angle is new since prior study and suggest developing effusion. No focal consolidation or airspace disease in the lungs.  IMPRESSION: Tortuous and ectatic aorta. Developing small right pleural effusion. No focal airspace disease in the lungs.   Electronically Signed   By: Lucienne Capers M.D.   On: 01/20/2014 23:05    Other results: EKG: no ekg available at time of handp.  Assessment & Plan by Problem: Principal Problem:   Hyperglycemia Active Problems:   DIABETES MELLITUS, TYPE II   HYPERTENSION   CONGESTIVE HEART FAILURE  Hyperglycemia - the patient presents with CBG's as high as 459, which have been occurring in the same timeframe as a recent URI, and are accompanied by  polyuria/polydipsia. His anion gap is mildly elevated at 14, but urine is negative for ketones, and bicarb is 29. At baseline, the patient's DM appears well-controlled, with his last A1C = 6.0.  -s/p 500 cc NS and 10 U Novolog in ED  -will give an additional 1.5 L NS bolus, then NS @ 150 cc/hr  -continue home Lantus 15 U qhs, first dose tonight  -check CBG's q4hrs, with sensitive sliding scale  -repeat BMET in AM   URI  - the patient notes symptoms of cough, congestion, and sneezing, with no purulent sputum, fever, or leukocytosis, and no evidence of opacity on CXR. Symptoms likely represent a viral URI, unlikely pneumonia.  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 He now presents with Cr = 2.6, above his baseline of 1.6. This most likely represents pre-renal azotemia, in the setting of volume depletion secondary to hyperglycemia. However, there is likely also a component of ATN, given ARB use in the setting of hypovolemia. BUN:Cr ratio < 20:1.  -IVF per above  -hold losartan, lasix, and HCTZ for now   CHF - the patient has a history of CHF. Last echo in 03/2013 shows an EF 55-60%, with grade 2 diastolic dysfunction. We will monitor fluid status closely, but for now patient appears hypovolemia.  -IVF per above  -holding losartan, diuretics  -continue labetalol   HTN - The patient's BP is mildly elevated  -continue clonidine, hydralazine, labetalol  -hold losartan, diuretics   R sided pleural effusion The patient appears dehydrated on physical exam. The etiology of this small right sided pleural effusion is unclear. The patient does report non-productive cough. However, this is likely due to URI as patient has recent sick contact and it is associated with congestion. Will plan for follow up CXR as outpatient to ensure resolution.   Diet: Carb Mod  DVT: heparin sq  Dispo: Disposition is deferred at this time, awaiting improvement of current medical problems. Anticipated discharge in approximately 1 day(s).   The patient does have a current PCP Axel Filler, MD) and does need an [facility] hospital follow-up appointment after discharge.  The patient does have transportation limitations that hinder transportation to clinic appointments.  Signed: Marrion Coy, MD 01/20/2014, 10:38 PM

## 2014-01-20 NOTE — ED Notes (Signed)
Admitting at bedside 

## 2014-01-20 NOTE — Progress Notes (Signed)
  Pt admitted to the unit. Pt is stable, alert and oriented per baseline. Oriented to room, staff, and call bell. Educated to call for any assistance. Bed in lowest position, call bell within reach- will continue to monitor. 

## 2014-01-20 NOTE — ED Notes (Signed)
Pt states that he thinks he recently caught a virus from his wife. Pt has no idea why his blood sugars have been increasing. Pt takes injections and adjusts medications according to CBG. tp states a few high reading at home today. Pt alert and oriented. Pt hungry and ate crackers and had ginger ale in the lobby prior to coming back.

## 2014-01-21 ENCOUNTER — Encounter (HOSPITAL_COMMUNITY): Payer: Self-pay | Admitting: General Practice

## 2014-01-21 DIAGNOSIS — J069 Acute upper respiratory infection, unspecified: Secondary | ICD-10-CM | POA: Diagnosis present

## 2014-01-21 DIAGNOSIS — I129 Hypertensive chronic kidney disease with stage 1 through stage 4 chronic kidney disease, or unspecified chronic kidney disease: Secondary | ICD-10-CM

## 2014-01-21 DIAGNOSIS — N183 Chronic kidney disease, stage 3 unspecified: Secondary | ICD-10-CM | POA: Diagnosis present

## 2014-01-21 DIAGNOSIS — E119 Type 2 diabetes mellitus without complications: Secondary | ICD-10-CM

## 2014-01-21 DIAGNOSIS — N179 Acute kidney failure, unspecified: Secondary | ICD-10-CM

## 2014-01-21 DIAGNOSIS — I509 Heart failure, unspecified: Secondary | ICD-10-CM

## 2014-01-21 DIAGNOSIS — N189 Chronic kidney disease, unspecified: Secondary | ICD-10-CM

## 2014-01-21 LAB — GLUCOSE, CAPILLARY
GLUCOSE-CAPILLARY: 172 mg/dL — AB (ref 70–99)
Glucose-Capillary: 217 mg/dL — ABNORMAL HIGH (ref 70–99)
Glucose-Capillary: 360 mg/dL — ABNORMAL HIGH (ref 70–99)

## 2014-01-21 LAB — BASIC METABOLIC PANEL
BUN: 38 mg/dL — ABNORMAL HIGH (ref 6–23)
CALCIUM: 8.2 mg/dL — AB (ref 8.4–10.5)
CO2: 28 mEq/L (ref 19–32)
Chloride: 93 mEq/L — ABNORMAL LOW (ref 96–112)
Creatinine, Ser: 2.21 mg/dL — ABNORMAL HIGH (ref 0.50–1.35)
GFR calc Af Amer: 37 mL/min — ABNORMAL LOW (ref >90)
GFR, EST NON AFRICAN AMERICAN: 32 mL/min — AB (ref >90)
GLUCOSE: 227 mg/dL — AB (ref 70–99)
Potassium: 3.8 mEq/L (ref 3.7–5.3)
Sodium: 132 mEq/L — ABNORMAL LOW (ref 137–147)

## 2014-01-21 NOTE — Progress Notes (Signed)
Patient has finished 1500cc bolus. Patient is alert and b/p has decreased. Will continue to monitor

## 2014-01-21 NOTE — Discharge Summary (Signed)
INTERNAL MEDICINE ATTENDING DISCHARGE COSIGN   I evaluated the patient on the day of discharge and discussed the discharge plan with my resident team. I agree with the discharge documentation and disposition.   [name] 01/21/2014, 1:14 PM

## 2014-01-21 NOTE — H&P (Signed)
Internal Medicine Attending Admission Note Date: 01/21/2014  Patient name: Charles HEAVEY Sr. Medical record number: [name] Date of birth: 11/12/1955 Age: 58 y.o. Gender: male  I saw and evaluated the patient. I reviewed the resident's note and I agree with the resident's findings and plan as documented in the resident's note.  Chief Complaint(s): hyperglycemia   History - key components related to admission: Pt is a 58 y/o male with PMH of DM, HTN, CKD, chronic diastolic CHF who presents with elevated BS at home. Pt says taht 3 days ago he started developing a dry cough and nasal congestion assoc with a sore throat. 2 days ago he noticed his hand twitch * 1 episode and his wife noted that he had some "twitches" when he slept. He took his CBG and found it to be in the 400s yesterday and day before so he came to the hospital for further work up. No CP, no SOB, no abd pain, no n/v, no lightheadedness, no syncope, no fevers. Pt's wife had been sick before him. Remaining ROS negative   Physical Exam - key components related to admission:  Filed Vitals:   01/21/14 0123 01/21/14 0433 01/21/14 0917 01/21/14 0918  BP: 162/68 151/74  184/92  Pulse: 80 72 71 71  Temp:  98.1 F (36.7 C)    TempSrc:  Oral    Resp:  18    Height:      Weight:      SpO2:  97%    Cardio- RRR, normal heart sounds Lungs - CTA b/l Abd- soft, non tender,non distnded, BS+ Ext- no pedal edema Gen- AAO*3, NAD  Lab results:   Basic Metabolic Panel:  Recent Labs  01/20/14 1824 01/20/14 1841 01/21/14 0608  NA 130* 133* 132*  K 3.8 3.7 3.8  CL 87* 89* 93*  CO2 29  --  28  GLUCOSE 383* 394* 227*  BUN 43* 41* 38*  CREATININE 2.61* 2.80* 2.21*  CALCIUM 9.1  --  8.2*   Liver Function Tests:  Recent Labs  01/20/14 1824  AST 14  ALT 12  ALKPHOS 75  BILITOT 0.5  PROT 7.4  ALBUMIN 3.5   No results found for this basename: LIPASE, AMYLASE,  in the last 72 hours No results found for this basename:  AMMONIA,  in the last 72 hours CBC:  Recent Labs  01/20/14 1824 01/20/14 1841  WBC 5.8  --   HGB 11.4* 12.2*  HCT 34.3* 36.0*  MCV 81.9  --   PLT 224  --    Cardiac Enzymes: No results found for this basename: CKTOTAL, CKMB, CKMBINDEX, TROPONINI,  in the last 72 hours BNP: No components found with this basename: POCBNP,  D-Dimer: No results found for this basename: DDIMER,  in the last 72 hours CBG:  Recent Labs  01/20/14 1812 01/20/14 2108 01/21/14 0013 01/21/14 0430 01/21/14 0810  GLUCAP 403* 459* 360* 217* 172*   Hemoglobin A1C: No results found for this basename: HGBA1C,  in the last 72 hours Fasting Lipid Panel: No results found for this basename: CHOL, HDL, LDLCALC, TRIG, CHOLHDL, LDLDIRECT,  in the last 72 hours Thyroid Function Tests: No results found for this basename: TSH, T4TOTAL, FREET4, T3FREE, THYROIDAB,  in the last 72 hours Anemia Panel: No results found for this basename: VITAMINB12, FOLATE, FERRITIN, TIBC, IRON, RETICCTPCT,  in the last 72 hours Coagulation: No results found for this basename: INR,  in the last 72 hours Alcohol Level: No results found for this  basename: ETH,  in the last 72 hours  Imaging results:  Dg Chest 2 View  01/20/2014   CLINICAL DATA:  Cough for 3 days.  EXAM: CHEST  2 VIEW  COMPARISON:  08/25/2013  FINDINGS: Shallow inspiration. Heart size and pulmonary vascularity are normal for technique. Prominence of the central mediastinum probably due to aortic ectasia and tortuosity combined with shallow inspiration. Appearance is unchanged since prior study. Blunting of the posterior right costophrenic angle is new since prior study and suggest developing effusion. No focal consolidation or airspace disease in the lungs.  IMPRESSION: Tortuous and ectatic aorta. Developing small right pleural effusion. No focal airspace disease in the lungs.   Electronically Signed   By: Lucienne Capers M.D.   On: 01/20/2014 23:05    Assessment & Plan  by Problem:  Principal Problem:   Hyperglycemia Active Problems:   DIABETES MELLITUS, TYPE II   HYPERTENSION   CONGESTIVE HEART FAILURE   Acute upper respiratory infections of unspecified site   Acute on chronic kidney disease, stage 3   Hyperglycemia: - CBG improved today.  - c/w home dose of lantus, metformin - outpatient follow up in the clinic to recheck blood sugars and A1C - d/c IVF  AKI on CKD - Cr improved to 2.2 (baseline approx 1.6-1.9) - ok to resume losartan. Hold lasix, HCTZ for now - Recheck BMP on follow up appointment and consider resuming diuretics if improved  URI - likely viral. Will hold off on antibiotics for now given normal WBC, no fevers and normal CXR  Accelerated HTN - BP slightly elevated today - resume losartan and continue with other home meds - hold HCTZ and lasix for now  Pt stable for d/c home today

## 2014-01-21 NOTE — Discharge Instructions (Signed)
Please follow-up in [facility] with Dr. Ronnald Ramp on Friday.  We will need to repeat some bloodwork to see how you are doing at that time.   In the meantime, please do NOT take your hydrochlorothiazide or Lasix.  We will let you know about restarting these medicines on Friday.   You should also check your blood sugars three times daily and bring your meter with you to your appointment.   Please be sure to drink plenty of fluids so you do not become dehydrated.    Hyperglycemia Hyperglycemia occurs when the glucose (sugar) in your blood is too high. Hyperglycemia can happen for many reasons, but it most often happens to people who do not know they have diabetes or are not managing their diabetes properly.  CAUSES  Whether you have diabetes or not, there are other causes of hyperglycemia. Hyperglycemia can occur when you have diabetes, but it can also occur in other situations that you might not be as aware of, such as: Diabetes  If you have diabetes and are having problems controlling your blood glucose, hyperglycemia could occur because of some of the following reasons:  Not following your meal plan.  Not taking your diabetes medications or not taking it properly.  Exercising less or doing less activity than you normally do.  Being sick. Pre-diabetes  This cannot be ignored. Before people develop Type 2 diabetes, they almost always have "pre-diabetes." This is when your blood glucose levels are higher than normal, but not yet high enough to be diagnosed as diabetes. Research has shown that some long-term damage to the body, especially the heart and circulatory system, may already be occurring during pre-diabetes. If you take action to manage your blood glucose when you have pre-diabetes, you may delay or prevent Type 2 diabetes from developing. Stress  If you have diabetes, you may be "diet" controlled or on oral medications or insulin to control your diabetes. However, you may find that your blood  glucose is higher than usual in the hospital whether you have diabetes or not. This is often referred to as "stress hyperglycemia." Stress can elevate your blood glucose. This happens because of hormones put out by the body during times of stress. If stress has been the cause of your high blood glucose, it can be followed regularly by your caregiver. That way he/she can make sure your hyperglycemia does not continue to get worse or progress to diabetes. Steroids  Steroids are medications that act on the infection fighting system (immune system) to block inflammation or infection. One side effect can be a rise in blood glucose. Most people can produce enough extra insulin to allow for this rise, but for those who cannot, steroids make blood glucose levels go even higher. It is not unusual for steroid treatments to "uncover" diabetes that is developing. It is not always possible to determine if the hyperglycemia will go away after the steroids are stopped. A special blood test called an A1c is sometimes done to determine if your blood glucose was elevated before the steroids were started. SYMPTOMS  Thirsty.  Frequent urination.  Dry mouth.  Blurred vision.  Tired or fatigue.  Weakness.  Sleepy.  Tingling in feet or leg. DIAGNOSIS  Diagnosis is made by monitoring blood glucose in one or all of the following ways:  A1c test. This is a chemical found in your blood.  Fingerstick blood glucose monitoring.  Laboratory results. TREATMENT  First, knowing the cause of the hyperglycemia is important before the  hyperglycemia can be treated. Treatment may include, but is not be limited to:  Education.  Change or adjustment in medications.  Change or adjustment in meal plan.  Treatment for an illness, infection, etc.  More frequent blood glucose monitoring.  Change in exercise plan.  Decreasing or stopping steroids.  Lifestyle changes. HOME CARE INSTRUCTIONS   Test your blood  glucose as directed.  Exercise regularly. Your caregiver will give you instructions about exercise. Pre-diabetes or diabetes which comes on with stress is helped by exercising.  Eat wholesome, balanced meals. Eat often and at regular, fixed times. Your caregiver or nutritionist will give you a meal plan to guide your sugar intake.  Being at an ideal weight is important. If needed, losing as little as 10 to 15 pounds may help improve blood glucose levels. SEEK MEDICAL CARE IF:   You have questions about medicine, activity, or diet.  You continue to have symptoms (problems such as increased thirst, urination, or weight gain). SEEK IMMEDIATE MEDICAL CARE IF:   You are vomiting or have diarrhea.  Your breath smells fruity.  You are breathing faster or slower.  You are very sleepy or incoherent.  You have numbness, tingling, or pain in your feet or hands.  You have chest pain.  Your symptoms get worse even though you have been following your caregiver's orders.  If you have any other questions or concerns. Document Released: 01/25/2001 Document Revised: 10/24/2011 Document Reviewed: 11/28/2011 [facility] Patient Information 2014 [place], [place].   Upper Respiratory Infection, Adult An upper respiratory infection (URI) is also known as the common cold. It is often caused by a type of germ (virus). Colds are easily spread (contagious). You can pass it to others by kissing, coughing, sneezing, or drinking out of the same glass. Usually, you get better in 1 or 2 weeks.  HOME CARE   Only take medicine as told by your doctor.  Use a warm mist humidifier or breathe in steam from a hot shower.  Drink enough water and fluids to keep your pee (urine) clear or pale yellow.  Get plenty of rest.  Return to work when your temperature is back to normal or as told by your doctor. You may use a face mask and wash your hands to stop your cold from spreading. GET HELP RIGHT AWAY IF:   After the  first few days, you feel you are getting worse.  You have questions about your medicine.  You have chills, shortness of breath, or brown or red spit (mucus).  You have yellow or brown snot (nasal discharge) or pain in the face, especially when you bend forward.  You have a fever, puffy (swollen) neck, pain when you swallow, or white spots in the back of your throat.  You have a bad headache, ear pain, sinus pain, or chest pain.  You have a high-pitched whistling sound when you breathe in and out (wheezing).  You have a lasting cough or cough up blood.  You have sore muscles or a stiff neck. MAKE SURE YOU:   Understand these instructions.  Will watch your condition.  Will get help right away if you are not doing well or get worse. Document Released: 01/18/2008 Document Revised: 10/24/2011 Document Reviewed: 12/06/2010 [facility] Patient Information 2014 [place], [place].

## 2014-01-21 NOTE — Care Management Note (Signed)
    Page 1 of 1   01/21/2014     1:56:26 PM CARE MANAGEMENT NOTE 01/21/2014  Patient:  Charles Hart, Charles Hart   Account Number:  [id number]  Date Initiated:  01/21/2014  Documentation initiated by:  Tomi Bamberger  Subjective/Objective Assessment:   dx hyperglycemia, aki  admit- lives with with family.     Action/Plan:   Anticipated DC Date:  01/21/2014   Anticipated DC Plan:  [place]  CM consult      Choice offered to / List presented to:             Status of service:  Completed, signed off Medicare Important Message given?  NA - LOS <3 / Initial given by admissions (If response is "NO", the following Medicare IM given date fields will be blank) Date Medicare IM given:   Date Additional Medicare IM given:    Discharge Disposition:  HOME/SELF CARE  Per UR Regulation:  Reviewed for med. necessity/level of care/duration of stay  If discussed at [place] of Stay Meetings, dates discussed:    Comments:  01/21/14 [place], BSN [phone] patient for dc , no needs anticiapated.

## 2014-01-21 NOTE — Progress Notes (Signed)
Subjective: Charles Hart is doing well this morning, still coughing (non-productive) and having some congestion but no fevers.  Asking to go home.   Objective: Vital signs in last 24 hours: Filed Vitals:   01/20/14 2313 01/20/14 2351 01/21/14 0123 01/21/14 0433  BP: 152/92 191/86 162/68 151/74  Pulse: 78 69 80 72  Temp: 99.1 F (37.3 C) 97.7 F (36.5 C)  98.1 F (36.7 C)  TempSrc: Oral Oral  Oral  Resp: 16 18  18   Height:  6' (1.829 m)    Weight:  215 lb 4.8 oz (97.659 kg)    SpO2: 99% 100%  97%   PEX General: alert, cooperative, and in no apparent distress (coughing intermittently) HEENT: NCAT, vision grossly intact, oropharynx clear and non-erythematous  Neck: supple, no lymphadenopathy Lungs: clear to ascultation bilaterally, normal work of respiration, no wheezes, rales, ronchi Heart: regular rate and rhythm, no murmurs, gallops, or rubs Abdomen: soft, non-tender, non-distended, normal bowel sounds Extremities: 2+ DP/PT pulses bilaterally, no cyanosis, clubbing, or edema Neurologic: alert & oriented X3, cranial nerves II-XII intact, strength grossly intact, sensation intact to light touch  Lab Results: Basic Metabolic Panel:  Recent Labs Lab 01/20/14 1824 01/20/14 1841 01/21/14 0608  NA 130* 133* 132*  K 3.8 3.7 3.8  CL 87* 89* 93*  CO2 29  --  28  GLUCOSE 383* 394* 227*  BUN 43* 41* 38*  CREATININE 2.61* 2.80* 2.21*  CALCIUM 9.1  --  8.2*   Liver Function Tests:  Recent Labs Lab 01/20/14 1824  AST 14  ALT 12  ALKPHOS 75  BILITOT 0.5  PROT 7.4  ALBUMIN 3.5   CBC:  Recent Labs Lab 01/20/14 1824 01/20/14 1841  WBC 5.8  --   HGB 11.4* 12.2*  HCT 34.3* 36.0*  MCV 81.9  --   PLT 224  --    CBG:  Recent Labs Lab 01/20/14 1812 01/20/14 2108 01/21/14 0013 01/21/14 0430  GLUCAP 403* 459* 360* 217*   Urine Drug Screen: Drugs of Abuse     Component Value Date/Time   LABOPIA NONE DETECTED 07/03/2012 2346   COCAINSCRNUR NONE DETECTED  07/03/2012 2346   LABBENZ NONE DETECTED 07/03/2012 2346   AMPHETMU NONE DETECTED 07/03/2012 2346   THCU NONE DETECTED 07/03/2012 2346   LABBARB NONE DETECTED 07/03/2012 2346    Urinalysis:  Recent Labs Lab 01/20/14 1816  COLORURINE YELLOW  LABSPEC 1.017  PHURINE 5.0  GLUCOSEU >1000*  HGBUR NEGATIVE  BILIRUBINUR NEGATIVE  KETONESUR NEGATIVE  PROTEINUR 100*  UROBILINOGEN 0.2  NITRITE NEGATIVE  LEUKOCYTESUR NEGATIVE   Studies/Results: Dg Chest 2 View  01/20/2014   CLINICAL DATA:  Cough for 3 days.  EXAM: CHEST  2 VIEW  COMPARISON:  08/25/2013  FINDINGS: Shallow inspiration. Heart size and pulmonary vascularity are normal for technique. Prominence of the central mediastinum probably due to aortic ectasia and tortuosity combined with shallow inspiration. Appearance is unchanged since prior study. Blunting of the posterior right costophrenic angle is new since prior study and suggest developing effusion. No focal consolidation or airspace disease in the lungs.  IMPRESSION: Tortuous and ectatic aorta. Developing small right pleural effusion. No focal airspace disease in the lungs.   Electronically Signed   By: Lucienne Capers M.D.   On: 01/20/2014 23:05   Medications: I have reviewed the patient's current medications. Scheduled Meds: . cloNIDine  0.3 mg Oral BID  . clopidogrel  75 mg Oral q morning - 10a  . digoxin  0.25 mg Oral  q morning - 10a  . gabapentin  300 mg Oral q morning - 10a  . heparin  5,000 Units Subcutaneous 3 times per day  . hydrALAZINE  100 mg Oral TID  . insulin aspart  0-9 Units Subcutaneous 6 times per day  . insulin glargine  15 Units Subcutaneous QHS  . labetalol  300 mg Oral BID  . simvastatin  20 mg Oral q1800   Continuous Infusions: . sodium chloride 150 mL/hr at 01/21/14 0032   PRN Meds:.fluticasone Assessment/Plan: #Hyperglycemia- Patient presented with CBGs as high as 459, which have been occurring in the same timeframe as a recent URI, and are  accompanied by polyuria/polydipsia. Anion gap on admission mildly elevated at 14, but bicarb 29, no ketonuria.  At baseline, DM2 is well-controlled with A1C 6.0% in 10/2012 (stable for past year).  S/p Novolog 10 units, NS 500 cc bolus in ED, additional NS 1.5L bolus on admission. AG 11 this morning with CBGs 170s-220s.  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 Baseline Cr 1.6-1.8.  Cr on admission 2.6 --> 2.2 today. Most likely prerenal 2/2 volume depletion in setting of hyperglycemia. However, there is likely also a component of ATN, given ARB use in the setting of hypovolemia as BUN:Cr ratio < 20:1.  -IVFs per above  -holding losartan, Lasix, and HCTZ for now   #URI- Patient presented with non-productive cough, congestion; afebrile, no leucocytosis.  CXR negative for airspace disease. Symptoms most likely represent a viral URI (and patient's wife had same sx over the weekend).  Of note, CXR on admission did show very small right pleural effusion, consider follow-up CXR in 4-6 weeks to ensure resolution.  -supportive care  #HTN- slightly hypertensive this morning -continue home clonidine, hydralazine, labetalol  -will restart losartan at discharge -PCP follow-up on Friday   #dCHF- Last echo in 03/2013 shows an EF 55-60%, with grade 2 diastolic dysfunction. No evidence of volume overload on exam today s/p IVFs overnight.  -IVFs per above -continue labetalol  -holding losartan, diuretics for now  Dispo:  Anticipated discharge today.   The patient does have a current PCP Axel Filler, MD) and does need an [facility] hospital follow-up appointment after discharge.   .Services Needed at time of discharge: Y = Yes, Blank = No PT:   OT:   RN:   Equipment:   Other:     LOS: 1 day    Ivin Poot, MD 01/21/2014, 7:19 AM

## 2014-01-21 NOTE — ED Provider Notes (Signed)
I saw and evaluated the patient, reviewed the resident's note and I agree with the findings and plan.   EKG Interpretation None     Patient with hyperglycemia. Worsening renal insufficiency. He does not appear to be in severe DKA. Will admit to internal medicine  Jasper Riling. Alvino Chapel, MD 01/21/14 1515

## 2014-01-21 NOTE — Discharge Summary (Signed)
Name: Charles DENNING Sr. MRN: [id number] DOB: July 23, 1956 58 y.o. PCP: Axel Filler, MD  Date of Admission: 01/20/2014  8:48 PM Date of Discharge: 01/21/2014 Attending Physician: Aldine Contes, MD  Discharge Diagnosis: Principal Problem:   Hyperglycemia Active Problems:   DIABETES MELLITUS, TYPE II   HYPERTENSION   CONGESTIVE HEART FAILURE   Acute upper respiratory infections of unspecified site   Acute on chronic kidney disease, stage 3  Discharge Medications:   Medication List    STOP taking these medications       furosemide 40 MG tablet  Commonly known as:  LASIX     hydrochlorothiazide 25 MG tablet  Commonly known as:  HYDRODIURIL      TAKE these medications       ACCU-CHEK FASTCLIX LANCETS Misc  Check blood sugar as directed up to 3 times a day dx code 250.00     ACCU-CHEK NANO SMARTVIEW W/DEVICE Kit  Check blood sugar as directed up to 3 times a day dx code 250.00     cloNIDine 0.3 MG tablet  Commonly known as:  CATAPRES  Take 0.3 mg by mouth 2 (two) times daily.     clopidogrel 75 MG tablet  Commonly known as:  PLAVIX  Take 75 mg by mouth every morning.     digoxin 0.25 MG tablet  Commonly known as:  LANOXIN  Take 0.25 mg by mouth every morning.     gabapentin 300 MG capsule  Commonly known as:  NEURONTIN  Take 300 mg by mouth every morning.     glucose blood test strip  Commonly known as:  ACCU-CHEK SMARTVIEW  Check blood sugar as directed up to 3 times a day dx code 250.00     hydrALAZINE 50 MG tablet  Commonly known as:  APRESOLINE  Take 2 tablets (100 mg total) by mouth 3 (three) times daily.     Insulin Glargine 100 UNIT/ML Solostar Pen  Commonly known as:  LANTUS SOLOSTAR  Inject 15 Units into the skin at bedtime.     labetalol 300 MG tablet  Commonly known as:  NORMODYNE  Take 1 tablet (300 mg total) by mouth 2 (two) times daily.     losartan 50 MG tablet  Commonly known as:  COZAAR  Take 2 tablets (100 mg total) by  mouth every morning.     multivitamin with minerals Tabs tablet  Take 1 tablet by mouth every morning.     pravastatin 40 MG tablet  Commonly known as:  PRAVACHOL  Take 1 tablet (40 mg total) by mouth daily.        Disposition and follow-up:   Charles E Dyas Sr. was discharged from [facility] in Stable condition.  At the hospital follow up visit please address:  1.  Blood sugar control   2.  Resolution of URI?  3.  Restart HCTZ +/- Lasix if Cr back to baseline  4.  Labs / imaging needed at time of follow-up: BMP (check Cr), A1C  5.  Pending labs/ test needing follow-up: none  Follow-up Appointments: Follow-up Information   Follow up with Luanne Bras, MD On 01/24/2014. (10:30am)    Specialty:  Internal Medicine   Contact information:   [place] [address] [phone]       Discharge Instructions: Discharge Instructions   Diet - low sodium heart healthy    Complete by:  As directed      Increase activity slowly  Complete by:  As directed           Consultations:  none  Procedures Performed:  Dg Chest 2 View  01/20/2014   CLINICAL DATA:  Cough for 3 days.  EXAM: CHEST  2 VIEW  COMPARISON:  08/25/2013  FINDINGS: Shallow inspiration. Heart size and pulmonary vascularity are normal for technique. Prominence of the central mediastinum probably due to aortic ectasia and tortuosity combined with shallow inspiration. Appearance is unchanged since prior study. Blunting of the posterior right costophrenic angle is new since prior study and suggest developing effusion. No focal consolidation or airspace disease in the lungs.  IMPRESSION: Tortuous and ectatic aorta. Developing small right pleural effusion. No focal airspace disease in the lungs.   Electronically Signed   By: Lucienne Capers M.D.   On: 01/20/2014 23:05    Admission HPI:  Charles MCDIARMID Sr. is a 58 y.o. man with a phx of DM II, htn, CKD , dCHF (I) who presents to the ED  with a cc of high blood sugar. He was in his normal state of health until the 3 days when he developed congestion and cough. The cough is dry and non-productive. The patient has a positive sick contact who is his wife. She had similar symptoms of congestion. Over the following days his blood sugar continuously increased, until it read high a few times during the last 24 hours. He reports that he was compliant with his medications (15 U lantus, 250 BID metformin). His last A1C was 6.0 in 10/16/13. Admits to polyuria and polydipsia since Saturday (3 days ago).  He denies CP, SOB, Fever, chills, nausea, and vomiting.   Hospital Course by problem list: 1. Hyperglycemia- Patient presented with CBGs as high as 459, which were occurring in the same timeframe as a recent URI, and accompanied by polyuria/polydipsia. At baseline, DM2 is well-controlled with A1C 6.0% in 10/2012 (stable for past year). Anion gap on admission mildly elevated at 14, but bicarb 29, no ketonuria. S/p Novolog 10 units, NS 1L bolus x 2 with NS at 150 cc/hr overnight. Continued home Lantus 15 units qhs while inpatient and at discharge. AG 11, bicarb 28 morning of discharge with CBGs 170s-220s. Patient was also on SSI while inpatient and additional 14 units given thus may need to up-titrate insulin at PCP follow-up depending on blood sugar control.    2. AKI on CKD3, improved- Patient has baseline CKD, history of nephropathy due to long-standing HTN. Baseline Cr 1.6-1.8. Cr on admission 2.6. Most likely prerenal 2/2 volume depletion in setting of hyperglycemia. However, there was likely also a component of ATN, given ARB use in the setting of hypovolemia as BUN:Cr ratio < 20:1.  Gave aggressive IV hydration overnight with Cr 2.2 on day of discharge.  Restarted losartan at discharge given mild hypertension but continued holding HCTZ and Lasix.  Patient will need repeat BMP at PCP follow-up on 6/12.    3. URI- Patient presented with non-productive  cough, congestion but afebrile, no leucocytosis. CXR negative for airspace disease. Symptoms most likely represent a viral URI (and patient's wife had same sx over the weekend). Of note, CXR on admission did show very small right pleural effusion, consider follow-up CXR in 4-6 weeks to ensure resolution.   4. HTN- On clonidine, hydralazine, labetolol, losartan, HCTZ, and Lasix at home; losartan, HCTZ, and Lasix held on admission due to AKI and volume depletion. Patient was slightly hypertensive on day of discharge thus restarted losartan but continued holding  HCTZ, Lasix with PCP follow-up in 3 days, BMP at that time.   5. dCHF- Last echo in 03/2013 showed EF 55-60% with grade 2 diastolic dysfunction. No evidence of volume overload on exam s/p IVFs overnight.     Discharge Vitals:   BP 184/92  Pulse 71  Temp(Src) 98.1 F (36.7 C) (Oral)  Resp 18  Ht 6' (1.829 m)  Wt 215 lb 4.8 oz (97.659 kg)  BMI 29.19 kg/m2  SpO2 97%  Discharge Labs:  Results for orders placed during the hospital encounter of 01/20/14 (from the past 24 hour(s))  CBG MONITORING, ED     Status: Abnormal   Collection Time    01/20/14  6:12 PM      Result Value Ref Range   Glucose-Capillary 403 (*) 70 - 99 mg/dL  URINALYSIS, ROUTINE W REFLEX MICROSCOPIC     Status: Abnormal   Collection Time    01/20/14  6:16 PM      Result Value Ref Range   Color, Urine YELLOW  YELLOW   APPearance CLEAR  CLEAR   Specific Gravity, Urine 1.017  1.005 - 1.030   pH 5.0  5.0 - 8.0   Glucose, UA >1000 (*) NEGATIVE mg/dL   Hgb urine dipstick NEGATIVE  NEGATIVE   Bilirubin Urine NEGATIVE  NEGATIVE   Ketones, ur NEGATIVE  NEGATIVE mg/dL   Protein, ur 100 (*) NEGATIVE mg/dL   Urobilinogen, UA 0.2  0.0 - 1.0 mg/dL   Nitrite NEGATIVE  NEGATIVE   Leukocytes, UA NEGATIVE  NEGATIVE  URINE MICROSCOPIC-ADD ON     Status: Abnormal   Collection Time    01/20/14  6:16 PM      Result Value Ref Range   Squamous Epithelial / LPF FEW (*) RARE    WBC, UA 0-2  <3 WBC/hpf   RBC / HPF 3-6  <3 RBC/hpf   Bacteria, UA FEW (*) RARE   Casts GRANULAR CAST (*) NEGATIVE  CBC     Status: Abnormal   Collection Time    01/20/14  6:24 PM      Result Value Ref Range   WBC 5.8  4.0 - 10.5 K/uL   RBC 4.19 (*) 4.22 - 5.81 MIL/uL   Hemoglobin 11.4 (*) 13.0 - 17.0 g/dL   HCT 34.3 (*) 39.0 - 52.0 %   MCV 81.9  78.0 - 100.0 fL   MCH 27.2  26.0 - 34.0 pg   MCHC 33.2  30.0 - 36.0 g/dL   RDW 12.9  11.5 - 15.5 %   Platelets 224  150 - 400 K/uL  COMPREHENSIVE METABOLIC PANEL     Status: Abnormal   Collection Time    01/20/14  6:24 PM      Result Value Ref Range   Sodium 130 (*) 137 - 147 mEq/L   Potassium 3.8  3.7 - 5.3 mEq/L   Chloride 87 (*) 96 - 112 mEq/L   CO2 29  19 - 32 mEq/L   Glucose, Bld 383 (*) 70 - 99 mg/dL   BUN 43 (*) 6 - 23 mg/dL   Creatinine, Ser 2.61 (*) 0.50 - 1.35 mg/dL   Calcium 9.1  8.4 - 10.5 mg/dL   Total Protein 7.4  6.0 - 8.3 g/dL   Albumin 3.5  3.5 - 5.2 g/dL   AST 14  0 - 37 U/L   ALT 12  0 - 53 U/L   Alkaline Phosphatase 75  39 - 117 U/L   Total Bilirubin 0.5  0.3 - 1.2 mg/dL   GFR calc non Af Amer 26 (*) >90 mL/min   GFR calc Af Amer 30 (*) >90 mL/min  I-STAT CHEM 8, ED     Status: Abnormal   Collection Time    01/20/14  6:41 PM      Result Value Ref Range   Sodium 133 (*) 137 - 147 mEq/L   Potassium 3.7  3.7 - 5.3 mEq/L   Chloride 89 (*) 96 - 112 mEq/L   BUN 41 (*) 6 - 23 mg/dL   Creatinine, Ser 2.80 (*) 0.50 - 1.35 mg/dL   Glucose, Bld 394 (*) 70 - 99 mg/dL   Calcium, Ion 1.16  1.12 - 1.23 mmol/L   TCO2 30  0 - 100 mmol/L   Hemoglobin 12.2 (*) 13.0 - 17.0 g/dL   HCT 36.0 (*) 39.0 - 52.0 %  CBG MONITORING, ED     Status: Abnormal   Collection Time    01/20/14  9:08 PM      Result Value Ref Range   Glucose-Capillary 459 (*) 70 - 99 mg/dL  GLUCOSE, CAPILLARY     Status: Abnormal   Collection Time    01/21/14 12:13 AM      Result Value Ref Range   Glucose-Capillary 360 (*) 70 - 99 mg/dL   Comment 1  Notify RN     Comment 2 Documented in Chart    GLUCOSE, CAPILLARY     Status: Abnormal   Collection Time    01/21/14  4:30 AM      Result Value Ref Range   Glucose-Capillary 217 (*) 70 - 99 mg/dL   Comment 1 Notify RN    BASIC METABOLIC PANEL     Status: Abnormal   Collection Time    01/21/14  6:08 AM      Result Value Ref Range   Sodium 132 (*) 137 - 147 mEq/L   Potassium 3.8  3.7 - 5.3 mEq/L   Chloride 93 (*) 96 - 112 mEq/L   CO2 28  19 - 32 mEq/L   Glucose, Bld 227 (*) 70 - 99 mg/dL   BUN 38 (*) 6 - 23 mg/dL   Creatinine, Ser 2.21 (*) 0.50 - 1.35 mg/dL   Calcium 8.2 (*) 8.4 - 10.5 mg/dL   GFR calc non Af Amer 32 (*) >90 mL/min   GFR calc Af Amer 37 (*) >90 mL/min  GLUCOSE, CAPILLARY     Status: Abnormal   Collection Time    01/21/14  8:10 AM      Result Value Ref Range   Glucose-Capillary 172 (*) 70 - 99 mg/dL    Signed: Ivin Poot, MD 01/21/2014, 9:43 AM   Time Spent on Discharge: 35 minutes Services Ordered on Discharge: none Equipment Ordered on Discharge: none

## 2014-01-21 NOTE — Progress Notes (Signed)
Inpatient Diabetes Program Recommendations  AACE/ADA: New Consensus Statement on Inpatient Glycemic Control (2013)  Target Ranges:  Prepandial:   less than 140 mg/dL      Peak postprandial:   less than 180 mg/dL (1-2 hours)      Critically ill patients:  140 - 180 mg/dL     Results for LEILAN, CALEB (MRN [id number]) as of 01/21/2014 09:28  Ref. Range 01/20/2014 18:12 01/20/2014 21:08  Glucose-Capillary Latest Range: 70-99 mg/dL 403 (H) 459 (H)    Results for LAVONTAY, MISH (MRN [id number]) as of 01/21/2014 09:28  Ref. Range 01/21/2014 00:13 01/21/2014 04:30 01/21/2014 08:10  Glucose-Capillary Latest Range: 70-99 mg/dL 360 (H) 217 (H) 172 (H)     Admitted with Hyperglycemia.  Glucose 383 mg/dl on admission.  Patient states he and his wife both had URI symptoms several days prior to admission.  Home DM Meds: Lantus 15 units QHS + Metformin 250 mg bid   **Patient was given IVF and started on Novolog Sensitive SSI last night at bedtime.  Lantus 15 units QHS also started last evening around 1am.  **CBGs trending downward.  Noted patient may go home today.   Will follow Wyn Quaker RN, MSN, CDE Diabetes Coordinator Inpatient Diabetes Program Team Pager: [phone] (8a-10p)

## 2014-01-21 NOTE — Progress Notes (Addendum)
Patient discharge teaching given, including activity, diet, follow-up appoints, and medications. Patient's home medications returned to him from pharmacy. Patient verbalized understanding of all discharge instructions. IV access was d/c'd. Vitals are stable. Skin is intact except as charted in most recent assessments. Pt to be escorted out by NT, to be driven home by family.

## 2014-01-24 ENCOUNTER — Ambulatory Visit (INDEPENDENT_AMBULATORY_CARE_PROVIDER_SITE_OTHER): Payer: PRIVATE HEALTH INSURANCE | Admitting: Internal Medicine

## 2014-01-24 ENCOUNTER — Encounter: Payer: Self-pay | Admitting: Internal Medicine

## 2014-01-24 VITALS — BP 167/78 | HR 66 | Temp 96.6°F | Ht 72.0 in | Wt 221.8 lb

## 2014-01-24 DIAGNOSIS — N183 Chronic kidney disease, stage 3 unspecified: Secondary | ICD-10-CM

## 2014-01-24 DIAGNOSIS — I509 Heart failure, unspecified: Secondary | ICD-10-CM

## 2014-01-24 DIAGNOSIS — E785 Hyperlipidemia, unspecified: Secondary | ICD-10-CM

## 2014-01-24 DIAGNOSIS — J069 Acute upper respiratory infection, unspecified: Secondary | ICD-10-CM

## 2014-01-24 DIAGNOSIS — N179 Acute kidney failure, unspecified: Secondary | ICD-10-CM

## 2014-01-24 DIAGNOSIS — I129 Hypertensive chronic kidney disease with stage 1 through stage 4 chronic kidney disease, or unspecified chronic kidney disease: Secondary | ICD-10-CM

## 2014-01-24 DIAGNOSIS — I639 Cerebral infarction, unspecified: Secondary | ICD-10-CM

## 2014-01-24 DIAGNOSIS — I10 Essential (primary) hypertension: Secondary | ICD-10-CM

## 2014-01-24 DIAGNOSIS — E119 Type 2 diabetes mellitus without complications: Secondary | ICD-10-CM

## 2014-01-24 LAB — BASIC METABOLIC PANEL WITH GFR
BUN: 36 mg/dL — ABNORMAL HIGH (ref 6–23)
CO2: 27 mEq/L (ref 19–32)
Calcium: 9.4 mg/dL (ref 8.4–10.5)
Chloride: 98 mEq/L (ref 96–112)
Creat: 1.97 mg/dL — ABNORMAL HIGH (ref 0.50–1.35)
GFR, EST NON AFRICAN AMERICAN: 37 mL/min — AB
GFR, Est African American: 43 mL/min — ABNORMAL LOW
Glucose, Bld: 397 mg/dL — ABNORMAL HIGH (ref 70–99)
Potassium: 4.9 mEq/L (ref 3.5–5.3)
Sodium: 136 mEq/L (ref 135–145)

## 2014-01-24 LAB — POCT GLYCOSYLATED HEMOGLOBIN (HGB A1C): HEMOGLOBIN A1C: 9.3

## 2014-01-24 LAB — GLUCOSE, CAPILLARY: GLUCOSE-CAPILLARY: 376 mg/dL — AB (ref 70–99)

## 2014-01-24 MED ORDER — CLONIDINE HCL 0.3 MG PO TABS: 0.3000 mg | ORAL_TABLET | Freq: Two times a day (BID) | ORAL | Status: DC

## 2014-01-24 MED ORDER — DIGOXIN 250 MCG PO TABS: 0.2500 mg | ORAL_TABLET | Freq: Every morning | ORAL | Status: DC

## 2014-01-24 MED ORDER — HYDROCHLOROTHIAZIDE 25 MG PO TABS: 25.0000 mg | ORAL_TABLET | Freq: Every day | ORAL | Status: DC

## 2014-01-24 MED ORDER — HYDROCHLOROTHIAZIDE 12.5 MG PO TABS: 25.0000 mg | ORAL_TABLET | Freq: Every day | ORAL | Status: DC

## 2014-01-24 MED ORDER — LABETALOL HCL 300 MG PO TABS: 300.0000 mg | ORAL_TABLET | Freq: Two times a day (BID) | ORAL | Status: DC

## 2014-01-24 MED ORDER — HYDRALAZINE HCL 50 MG PO TABS: 100.0000 mg | ORAL_TABLET | Freq: Three times a day (TID) | ORAL | Status: DC

## 2014-01-24 MED ORDER — CLOPIDOGREL BISULFATE 75 MG PO TABS: 75.0000 mg | ORAL_TABLET | Freq: Every morning | ORAL | Status: DC

## 2014-01-24 MED ORDER — FLUTICASONE PROPIONATE 50 MCG/ACT NA SUSP: 2.0000 | Freq: Every day | NASAL | Status: DC

## 2014-01-24 MED ORDER — GUAIFENESIN-CODEINE 100-10 MG/5ML PO SOLN: 10.0000 mL | Freq: Four times a day (QID) | ORAL | Status: DC | PRN

## 2014-01-24 MED ORDER — GABAPENTIN 300 MG PO CAPS: 300.0000 mg | ORAL_CAPSULE | Freq: Every morning | ORAL | Status: DC

## 2014-01-24 MED ORDER — LOSARTAN POTASSIUM 50 MG PO TABS: 100.0000 mg | ORAL_TABLET | Freq: Every morning | ORAL | Status: DC

## 2014-01-24 MED ORDER — PRAVASTATIN SODIUM 40 MG PO TABS: 40.0000 mg | ORAL_TABLET | Freq: Every day | ORAL | Status: DC

## 2014-01-24 MED ORDER — INSULIN GLARGINE 100 UNIT/ML SOLOSTAR PEN: 15.0000 [IU] | PEN_INJECTOR | Freq: Every day | SUBCUTANEOUS | Status: DC

## 2014-01-24 NOTE — Assessment & Plan Note (Signed)
Most recent ECHO w/ normal EF and Grade I diastolic dysfunction. During recent hospitalization, Lasix 40 mg qd was held in the setting of AKI. Repeat BMP still shows Cr of 1.97, increased from baseline. Patient w/ no signs of volume overload; no SOB, chest pain, NO LE swelling, PND, or orthopnea.  -Continue to hold Lasix for now -Return in 1 week; repeat BMP at that time, likely resume Lasix then.

## 2014-01-24 NOTE — Progress Notes (Signed)
Subjective:   Patient ID: Charles Ferries Sr. male   DOB: [birth year] 58 y.o.   MRN: [id number]  HPI: [name]. is a 58 y.o. male w/ PMHx of HTN, HLD, DM type II, CHF, and h/o CVA, presents to the clinic today for a hospital follow-up visit. Patient was admitted on 01/20/14 for hyperglycemia and AKI, in addition to URI. Today, Patient still complaining of significant congestion, rhinorrhea, and cough, but denies fever, chills, SOB, sore throat, diarrhea, nausea, or vomiting. Patient claims his blood sugar has been poorly controlled recently and attributes this mostly to his recent illness and poor diet. During admission, Lasix and HCTZ were held in the setting of AKI. No signs or symptoms of volume overload on exam; no LE swelling, no PND, orthopnea, DOE, or SOB. Lungs sound clear to ausculation.   Past Medical History  Diagnosis Date  . Dermatitis   . CHF (congestive heart failure)     LV function improved from 2004 to 2008.  Historically, moderately dilated LV with EF 30-40% by 2D echo 08/14/2002.  Mild CAD with severe LV dysfunction by cardiac cath 09/2002.  Normal coronary arteries and normal LV function by cardiac cath 09/19/2006.  A 2-D echo on 04/01/2009 showed mild concentric hypertrophy and normal systolic (LVEF  50-38%) and doppler C/W with grade 1 diastolic dysfunction.  . Hypertension   . Diabetes mellitus   . Hyperlipidemia   . Hearing loss in right ear   . Cardiomyopathy     LV function improved from 2004 to 2008.  Historically, moderately dilated LV with EF 30-40% by 2D echo 08/14/2002.  Mild CAD with severe LV dysfunction by cardiac cath 09/2002.  Normal coronary arteries and normal LV function by cardiac cath 09/19/2006.  A 2-D echo on 04/01/2009 showed mild concentric hypertrophy and normal systolic (LVEF  88-28%) and doppler C/W with grade 1 diastolic dysfunction.  . DM neuropathy, painful   . CVA (cerebral vascular accident) 07/04/2012    MRI of the brain 07/04/2012  showed an acute infarct in the right basal ganglia involving the anterior putamen, anterior limb internal capsule, and head of the caudate; this measured approximately 2.5 cm in diameter.     . Hypertensive crisis 07/28/2012   Current Outpatient Prescriptions  Medication Sig Dispense Refill  . ACCU-CHEK FASTCLIX LANCETS MISC Check blood sugar as directed up to 3 times a day dx code 250.00  102 each  5  . Blood Glucose Monitoring Suppl (ACCU-CHEK NANO SMARTVIEW) W/DEVICE KIT Check blood sugar as directed up to 3 times a day dx code 250.00  1 kit  0  . cloNIDine (CATAPRES) 0.3 MG tablet Take 0.3 mg by mouth 2 (two) times daily.      . clopidogrel (PLAVIX) 75 MG tablet Take 75 mg by mouth every morning.      . digoxin (LANOXIN) 0.25 MG tablet Take 0.25 mg by mouth every morning.      . gabapentin (NEURONTIN) 300 MG capsule Take 300 mg by mouth every morning.      [name] glucose blood (ACCU-CHEK SMARTVIEW) test strip Check blood sugar as directed up to 3 times a day dx code 250.00  100 each  5  . hydrALAZINE (APRESOLINE) 50 MG tablet Take 2 tablets (100 mg total) by mouth 3 (three) times daily.  90 tablet  6  . Insulin Glargine (LANTUS SOLOSTAR) 100 UNIT/ML SOPN Inject 15 Units into the skin at bedtime.  15 mL  3  .  labetalol (NORMODYNE) 300 MG tablet Take 1 tablet (300 mg total) by mouth 2 (two) times daily.  180 tablet  3  . losartan (COZAAR) 50 MG tablet Take 2 tablets (100 mg total) by mouth every morning.  60 tablet  3  . Multiple Vitamin (MULITIVITAMIN WITH MINERALS) TABS Take 1 tablet by mouth every morning.       . pravastatin (PRAVACHOL) 40 MG tablet Take 1 tablet (40 mg total) by mouth daily.  90 tablet  1   No current facility-administered medications for this visit.   Family History  Problem Relation Age of Onset  . Colon cancer Sister   . Aneurysm Father 46    died of rupture   History   Social History  . Marital Status: Married    Spouse Name: N/A    Number of Children: N/A  .  Years of Education: N/A   Social History Main Topics  . Smoking status: Never Smoker   . Smokeless tobacco: Never Used  . Alcohol Use: Yes     Comment: Wine occasionally (no more than 2 glasses per month)  . Drug Use: No  . Sexual Activity: None   Other Topics Concern  . None   Social History Narrative  . None    Review of Systems: General: Positive for poor appetite. Denies fever, chills, diaphoresis, and fatigue.  Respiratory: Positive for cough, congestion, and rhinorrhea. Denies SOB, DOE, chest tightness, and wheezing.   Cardiovascular: Denies chest pain and palpitations.  Gastrointestinal: Denies nausea, vomiting, abdominal pain, diarrhea, constipation, blood in stool and abdominal distention.  Genitourinary: Denies dysuria, urgency, frequency, hematuria, and flank pain. Endocrine: Denies hot or cold intolerance, polyuria, and polydipsia. Musculoskeletal: Denies myalgias, back pain, joint swelling, arthralgias and gait problem.  Skin: Denies pallor, rash and wounds.  Neurological: Denies dizziness, seizures, syncope, weakness, lightheadedness, numbness and headaches.  Psychiatric/Behavioral: Denies mood changes, confusion, nervousness, sleep disturbance and agitation.  Objective:   Physical Exam: Filed Vitals:   01/24/14 1040  BP: 167/78  Pulse: 66  Temp: 96.6 F (35.9 C)  TempSrc: Oral  Height: 6' (1.829 m)  Weight: 221 lb 12.8 oz (100.608 kg)  SpO2: 97%   General: Vital signs reviewed.  Patient is a well-developed and well-nourished, in no acute distress and cooperative with exam.  HEENT: Normocephalic and atraumatic. PERRL, EOMI, conjunctivae normal, no scleral icterus. Patient w/ mild maxillary tenderness, and congestion apparent on exam. No mucosal erythema or pharyngeal erythema.  Neck: Supple, trachea midline, normal ROM, No JVD, masses, thyromegaly, or carotid bruit present. No lymphadenopathy.  Cardiovascular: RRR, S1 normal, S2 normal, no murmurs, gallops,  or rubs. Pulmonary/Chest: Air entry equal bilaterally, no wheezes, rales, or rhonchi. Abdominal: Soft, non-tender, non-distended, BS +, no masses, organomegaly, or guarding present.  Musculoskeletal: No joint deformities, erythema, or stiffness, ROM full and nontender. Extremities: No swelling or edema, pulses symmetric and intact bilaterally. No cyanosis or clubbing. Neurological: A&O x3, Strength is normal and symmetric bilaterally, cranial nerve II-XII are grossly intact, no focal motor deficit, sensory intact to light touch bilaterally.  Skin: Warm, dry and intact. No rashes or erythema. Psychiatric: Normal mood and affect. Speech and behavior is normal. Cognition and memory are normal.   Assessment & Plan:   Please see problem based assessment and plan.

## 2014-01-24 NOTE — Assessment & Plan Note (Signed)
Still w/ elevated Cr of 1.97 today, though much improved from recent admission.  -Will continue to hold Lasix for now -Repeat BMP in 1 week. Resume Lasix at that time if improved.

## 2014-01-24 NOTE — Patient Instructions (Signed)
General Instructions:  1. Please schedule a follow up appointment for 4-6 weeks. Call sooner if not feeling better.   2. Please take all medications as prescribed.  Take Robitussin AC every 6 hours as needed for cough. Use Flonase Nasal spray daily for congestion symptoms.   Start taking HCTZ 25 mg qd.  I will call you w/ lab results to discuss restarting Lasix  3. If you have worsening of your symptoms or new symptoms arise, please call the clinic [phone]), or go to the ER immediately if symptoms are severe.  You have done a great job in taking all your medications. I appreciate it very much. Please continue doing that.    Please bring your medicines with you each time you come to clinic.  Medicines may include prescription medications, over-the-counter medications, herbal remedies, eye drops, vitamins, or other pills.   Progress Toward Treatment Goals:  Treatment Goal 01/24/2014  Hemoglobin A1C at goal  Blood pressure at goal    Self Care Goals & Plans:  Self Care Goal 01/24/2014  Manage my medications take my medicines as prescribed; bring my medications to every visit; refill my medications on time  Monitor my health keep track of my blood glucose; bring my glucose meter and log to each visit  Eat healthy foods drink diet soda or water instead of juice or soda; eat more vegetables; eat foods that are low in salt; eat baked foods instead of fried foods; eat fruit for snacks and desserts  Be physically active -  Meeting treatment goals maintain the current self-care plan    Home Blood Glucose Monitoring 01/24/2014  Check my blood sugar once a day  When to check my blood sugar before breakfast     Care Management & Community Referrals:  Referral 01/24/2014  Referrals made for care management support -  Referrals made to community resources none

## 2014-01-24 NOTE — Assessment & Plan Note (Signed)
Patient w/ continued nasal/sinus congestion and cough. Says he is somewhat better than when he was recently discharged from the hospital. Denies fever, chills, SOB, chest pain, nausea, dizziness, diarrhea, or headaches. Also denies sore throat. Pulmonary auscultation wnl, no pharyngeal erythema, no cervical lymphadenopathy. No concern for pneumonia at this time, suspect viral sinusitis.  -Robitussin AC q6h prn for cough -Flonase for rhinorrhea and congestion symptoms -Given absence of systemic symptoms, do not feel that antibiotics are necessary at this time, also suspect viral etiology.

## 2014-01-24 NOTE — Assessment & Plan Note (Addendum)
BP Readings from Last 3 Encounters:  01/24/14 167/78  01/21/14 184/92  10/23/13 162/82    Lab Results  Component Value Date   NA 132* 01/21/2014   K 3.8 01/21/2014   CREATININE 2.21* 01/21/2014    Assessment: Blood pressure control: mildly elevated Progress toward BP goal:  at goal Comments: Recently discharged from the hospital for hyperglycemia and upper respiratory tract infection. HELD HCTZ 25 mg qd and Lasix 40 mg qd during admission 2/2 AKI.   Plan: Medications:  continue current medications; Losartan 100 mg qd, Labetalol 300 mg bid, Hydralazine 100 mg tid, Clonidine 0.3 mg bid. RESTART HCTZ 25 mg qd today.  Educational resources provided: brochure Self management tools provided: home blood pressure logbook Other plans: Repeat BMP today shows Cr of 1.97, BUN of 36 (baseline 1.6-1.8). -Given continued elevated blood sugars (see DM section), still some concern for mild hypovolemia and continued pre-renal AKI. Will continue to HOLD Lasix for now.  -Patient to return in 1 week.

## 2014-01-24 NOTE — Assessment & Plan Note (Addendum)
Lab Results  Component Value Date   HGBA1C 9.3 01/24/2014   HGBA1C 6.0 10/16/2013   HGBA1C 6.5 06/19/2013     Assessment: Diabetes control: poor control (HgbA1C >9%) Progress toward A1C goal:  deteriorated Comments: Patient w/ recent hyperglycemia, resulting in his hospitalization in conjunction w/ upper respiratory tract infection. CBG 376 today, w/ repeat HbA1c 9.3.   Plan: Medications:  Increase Lantus to 20 units qhs (from 15 units).  Given patient's elevated CBG during clinic visit, advised patient to take an extra 5 units of Lantus today (in addition to 15 units this evening). Home glucose monitoring: Frequency: once a day Timing: before breakfast Instruction/counseling given: reminded to bring blood glucose meter & log to each visit Educational resources provided: brochure Self management tools provided:   Other plans: Advised patient to check blood sugar 2-3x daily until his next clinic visit.  -Schedule visit w/ Debera Lat during next appointment to discuss proper nutrition and dietary modifications. Discussed dietary changes at length during this visit.  -Repeat BMP today shows normal electrolytes, Cr of 1.97.  -RTC in 1 week for follow up w/ regards to CBG's

## 2014-01-27 NOTE — Progress Notes (Signed)
Case discussed with Dr. Jones at the time of the visit.  We reviewed the resident's history and exam and pertinent patient test results.  I agree with the assessment, diagnosis, and plan of care documented in the resident's note. 

## 2014-02-20 ENCOUNTER — Encounter: Payer: Self-pay | Admitting: Internal Medicine

## 2014-03-19 ENCOUNTER — Encounter: Payer: Self-pay | Admitting: Gastroenterology

## 2014-03-24 NOTE — Addendum Note (Signed)
Addended by: Hulan Fray on: 03/24/2014 02:13 PM   Modules accepted: Orders

## 2014-04-22 ENCOUNTER — Telehealth: Payer: Self-pay | Admitting: *Deleted

## 2014-04-22 NOTE — Telephone Encounter (Signed)
Call from Gonzella Lex, NP with [facility] - # [phone] NP did an annual home visit with pt and wanted to report CBG of 329 with urine glucose 3+ and protein 2+  I returned call to get more information and no answer. Pt last seen in clinic in June and was to return for recheck in 4 weeks. CBG was 376 at that visit. Hx: CKD and CHF

## 2014-04-23 NOTE — Telephone Encounter (Signed)
Agree with plan for patient to be seen this week in clinic.

## 2014-04-23 NOTE — Telephone Encounter (Signed)
Called pt and scheduled for OV this Friday with Dr Hayes Ludwig

## 2014-04-23 NOTE — Telephone Encounter (Signed)
I placed another call to Jannett Celestine, NP but no answer.  I will pass this information on to PCP

## 2014-04-25 ENCOUNTER — Encounter: Payer: Self-pay | Admitting: Licensed Clinical Social Worker

## 2014-04-25 ENCOUNTER — Encounter: Payer: Self-pay | Admitting: Internal Medicine

## 2014-04-25 ENCOUNTER — Ambulatory Visit (INDEPENDENT_AMBULATORY_CARE_PROVIDER_SITE_OTHER): Payer: PRIVATE HEALTH INSURANCE | Admitting: Internal Medicine

## 2014-04-25 VITALS — BP 197/88 | HR 55 | Temp 98.3°F | Wt 214.8 lb

## 2014-04-25 DIAGNOSIS — I10 Essential (primary) hypertension: Secondary | ICD-10-CM

## 2014-04-25 DIAGNOSIS — I509 Heart failure, unspecified: Secondary | ICD-10-CM

## 2014-04-25 DIAGNOSIS — E119 Type 2 diabetes mellitus without complications: Secondary | ICD-10-CM

## 2014-04-25 DIAGNOSIS — E785 Hyperlipidemia, unspecified: Secondary | ICD-10-CM

## 2014-04-25 LAB — POCT GLYCOSYLATED HEMOGLOBIN (HGB A1C): Hemoglobin A1C: 11

## 2014-04-25 LAB — COMPLETE METABOLIC PANEL WITH GFR
ALK PHOS: 73 U/L (ref 39–117)
ALT: 19 U/L (ref 0–53)
AST: 17 U/L (ref 0–37)
Albumin: 3.5 g/dL (ref 3.5–5.2)
BILIRUBIN TOTAL: 0.4 mg/dL (ref 0.2–1.2)
BUN: 32 mg/dL — AB (ref 6–23)
CHLORIDE: 94 meq/L — AB (ref 96–112)
CO2: 29 mEq/L (ref 19–32)
CREATININE: 1.97 mg/dL — AB (ref 0.50–1.35)
Calcium: 8.8 mg/dL (ref 8.4–10.5)
GFR, Est African American: 42 mL/min — ABNORMAL LOW
GFR, Est Non African American: 37 mL/min — ABNORMAL LOW
Glucose, Bld: 416 mg/dL — ABNORMAL HIGH (ref 70–99)
Potassium: 4.3 mEq/L (ref 3.5–5.3)
Sodium: 135 mEq/L (ref 135–145)
Total Protein: 6.6 g/dL (ref 6.0–8.3)

## 2014-04-25 LAB — GLUCOSE, CAPILLARY
GLUCOSE-CAPILLARY: 451 mg/dL — AB (ref 70–99)
GLUCOSE-CAPILLARY: 385 mg/dL — AB (ref 70–99)

## 2014-04-25 LAB — URINALYSIS, ROUTINE W REFLEX MICROSCOPIC
Bilirubin Urine: NEGATIVE
Ketones, ur: NEGATIVE mg/dL
LEUKOCYTES UA: NEGATIVE
Nitrite: NEGATIVE
SPECIFIC GRAVITY, URINE: 1.021 (ref 1.005–1.030)
Urobilinogen, UA: 1 mg/dL (ref 0.0–1.0)
pH: 6 (ref 5.0–8.0)

## 2014-04-25 LAB — LIPID PANEL
Cholesterol: 123 mg/dL (ref 0–200)
HDL: 31 mg/dL — AB (ref >39)
LDL CALC: 40 mg/dL (ref 0–99)
Total CHOL/HDL Ratio: 4 Ratio
Triglycerides: 260 mg/dL — ABNORMAL HIGH (ref <150)
VLDL: 52 mg/dL — ABNORMAL HIGH (ref 0–40)

## 2014-04-25 LAB — CBC
HCT: 31.3 % — ABNORMAL LOW (ref 39.0–52.0)
Hemoglobin: 10.9 g/dL — ABNORMAL LOW (ref 13.0–17.0)
MCH: 27.8 pg (ref 26.0–34.0)
MCHC: 34.8 g/dL (ref 30.0–36.0)
MCV: 79.8 fL (ref 78.0–100.0)
Platelets: 224 10*3/uL (ref 150–400)
RBC: 3.92 MIL/uL — ABNORMAL LOW (ref 4.22–5.81)
RDW: 12.1 % (ref 11.5–15.5)
WBC: 3.5 10*3/uL — ABNORMAL LOW (ref 4.0–10.5)

## 2014-04-25 LAB — URINALYSIS, MICROSCOPIC ONLY

## 2014-04-25 MED ORDER — INSULIN ASPART 100 UNIT/ML ~~LOC~~ SOLN
5.0000 [IU] | Freq: Once | SUBCUTANEOUS | Status: AC
  Administered 2014-04-25: 5 [IU] via SUBCUTANEOUS

## 2014-04-25 NOTE — Assessment & Plan Note (Signed)
Lipid panel checked today. 

## 2014-04-25 NOTE — Progress Notes (Signed)
[name] was referred to CSW as pt states he is unable to afford his medications.  Physician concern as pt is in need a medications.  Physician referred to Financial Counselor for assistance with potential charity assistance.  Pt has [facility] Medicare and is dual eligible.  [name] has had one admission in the last 6 months and has chronic medical conditions.  CSW met with [name] to discuss benefits of [facility] as an additional resource.  Pt states generally he can afford his medication.  He was expecting funds to arrive last month but they did not.  Pt states it is only one he can not afford.  CSW reassured pt that [facility] does not want him to be without medications and would like to link with resources that would be able to assist before he is without medication.  [name] states he has not had his medicine sicne Wednesday night.  CSW informed [name] of 24 hr nurse line available through [facility] and community care Freight forwarder.  Pt in agreement with referral to [facility].

## 2014-04-25 NOTE — Progress Notes (Signed)
   Subjective:    Patient ID: Charles Hart., male    DOB: 09/07/1955, 58 y.o.   MRN: [name]  Diabetes Pertinent negatives for hypoglycemia include no dizziness, headaches, speech difficulty or tremors. Pertinent negatives for diabetes include no chest pain, no fatigue and no weakness.  Hypertension Pertinent negatives include no chest pain, headaches or shortness of breath.   Charles Hart is a 58 year old man with PMH of HTN, CHF, CAD, DM2, hx of CVA, who presents for follow up visit for his diabetes and blood pressure.  He states that he has not received his disability check this month and therefore has been out of his medications including his Lantus since last week. He states that he will not receive a check probably until the end of this month and will not be able to buy his medicines until then.  He states that he is thirsty but otherwise feels "fine".    Review of Systems  Constitutional: Negative for fever, chills, diaphoresis, activity change, appetite change, fatigue and unexpected weight change.  HENT: Negative for rhinorrhea.   Eyes: Negative for visual disturbance.  Respiratory: Negative for cough, shortness of breath and wheezing.   Cardiovascular: Negative for chest pain and leg swelling.  Gastrointestinal: Negative for abdominal pain.  Genitourinary: Negative for dysuria and frequency.  Musculoskeletal: Negative for back pain.  Neurological: Negative for dizziness, tremors, speech difficulty, weakness, light-headedness and headaches.  Psychiatric/Behavioral: Negative for agitation.       Objective:   Physical Exam  Nursing note reviewed. Constitutional: He is oriented to person, place, and time. He appears well-developed and well-nourished. No distress.  HENT:  Head: Atraumatic.  Eyes: Conjunctivae are normal. No scleral icterus.  Cardiovascular: Normal rate and regular rhythm.   Pulmonary/Chest: Effort normal and breath sounds normal. No respiratory  distress. He has no wheezes. He has no rales.  Abdominal: Soft. There is no tenderness.  Musculoskeletal: He exhibits no edema and no tenderness.  Neurological: He is alert and oriented to person, place, and time. Coordination normal.  Skin: Skin is warm and dry. He is not diaphoretic. No erythema.  Psychiatric: He has a normal mood and affect. His behavior is normal.          Assessment & Plan:

## 2014-04-25 NOTE — Patient Instructions (Signed)
-  Please go to the Pharmacy and pick up the prescription for:  -Losartan -Clonidine -Plavix -Digoxin -Hydralazine -Hydrochlorothiazide labetalol -Lantus  All these prescriptions will cost $23.76.  You are being given $24 today to cover for the cost of your medications.   Please return to clinic in 1 week for blood pressure recheck and for blood sugar recheck.

## 2014-04-25 NOTE — Assessment & Plan Note (Addendum)
Lab Results  Component Value Date   HGBA1C 11.0 04/25/2014   HGBA1C 9.3 01/24/2014   HGBA1C 6.0 10/16/2013     Assessment: Diabetes control:  Not controlled Progress toward A1C goal:   Not at goal, HgA1C of 11% Comments: He is on lantus 20 units qHS but ran out of it 3 days ago. He has been drinking gatorate  But met with Debera Lat during this visit and understands that he should avoid sugary drinks. His BS was elevated at 451 during this visit. He was given Novolog 5 units and had a carb mod meal tray. His repeat BS after 1 hours was 385. CMP STAT revealed nl K, Cr of 1.9 (baseline), with AG of 13 (baseline). He was given emergency cash to purchase his Lantus which will cost him $6.60.  Plan: Medications:  continue current medications Home glucose monitoring: Frequency:   Timing:   Instruction/counseling given: reminded to bring blood glucose meter & log to each visit and reminded to bring medications to each visit, discussed diet Educational resources provided:   Self management tools provided:   Other plans: He was asked to return in 1 week, to bring his CBG meter and his medications. Foot exam performed during this visit.

## 2014-04-25 NOTE — Assessment & Plan Note (Signed)
BP Readings from Last 3 Encounters:  04/25/14 197/88  01/24/14 167/78  01/21/14 184/92    Lab Results  Component Value Date   NA 135 04/25/2014   K 4.3 04/25/2014   CREATININE 1.97* 04/25/2014    Assessment: Blood pressure control:  Not controlled Progress toward BP goal:   Not at goal, elevated to 216/89 initially, 197/88 on recheck Comments: He is on clonidine 0.3mg  BID, digoxin 0.25mg  daily, hydralazine 100mg  TID, HCTZ 25mg  daily. Losartan 100mg  daily, and labetalol 300mg  BID. [name] Lasix is still on HOLD. He is not sure what medications he has left but thinks he has run out of most of them. His disability check has not come for the month and he stated that he could not buy his medicines until the end of this month. With assistance from financial counselor, Bonna Gains, pt was given $24 to cover for all his medications: Losartan, clonidine, Plavix, digoxin, hydralazine, HCTZ, labetalol, and Lantus. I called his Pharmacy and confirmed that all these medications were ready for pick up this afternoon.   Plan: Medications:  continue current medications Educational resources provided:   Self management tools provided:   Other plans: Patient agrees to resume his medications today. He will follow up in 1 week for BP recheck.

## 2014-04-26 LAB — MICROALBUMIN / CREATININE URINE RATIO
Creatinine, Urine: 60.1 mg/dL
Microalb Creat Ratio: 2475.4 mg/g — ABNORMAL HIGH (ref 0.0–30.0)
Microalb, Ur: 148.77 mg/dL — ABNORMAL HIGH (ref 0.00–1.89)

## 2014-04-30 NOTE — Progress Notes (Signed)
Case discussed with Dr. Kennerly soon after the resident saw the patient.  We reviewed the resident's history and exam and pertinent patient test results.  I agree with the assessment, diagnosis, and plan of care documented in the resident's note. 

## 2014-05-02 ENCOUNTER — Ambulatory Visit (INDEPENDENT_AMBULATORY_CARE_PROVIDER_SITE_OTHER): Payer: PRIVATE HEALTH INSURANCE | Admitting: Internal Medicine

## 2014-05-02 ENCOUNTER — Encounter: Payer: Self-pay | Admitting: Internal Medicine

## 2014-05-02 VITALS — BP 178/75 | HR 69 | Temp 97.9°F | Ht 72.0 in | Wt 212.8 lb

## 2014-05-02 DIAGNOSIS — E119 Type 2 diabetes mellitus without complications: Secondary | ICD-10-CM

## 2014-05-02 DIAGNOSIS — E1165 Type 2 diabetes mellitus with hyperglycemia: Secondary | ICD-10-CM

## 2014-05-02 DIAGNOSIS — I10 Essential (primary) hypertension: Secondary | ICD-10-CM

## 2014-05-02 DIAGNOSIS — IMO0001 Reserved for inherently not codable concepts without codable children: Secondary | ICD-10-CM

## 2014-05-02 NOTE — Patient Instructions (Signed)
-  Start checking you blood pressure every day, after 5 minutes of sitting and resting.  -Bring your blood pressure monitor and the log sheet with your to your next appointment.  -Check your blood sugar in the morning before breakfast, after lunch, and after dinner.  -Bring your blood sugar meter with your during your next visit.  -Make an appointment to see [facility], the diabetes educator, to discuss your insulin use and your diet.  -Follow up with [name] in 2 weeks.   Please bring your medicines with you each time you come.   Medicines may be  Eye drops  Herbal   Vitamins  Pills  Seeing these help [name] take care of you.

## 2014-05-02 NOTE — Assessment & Plan Note (Signed)
BP Readings from Last 3 Encounters:  05/02/14 168/68  04/25/14 197/88  01/24/14 167/78    Lab Results  Component Value Date   NA 135 04/25/2014   K 4.3 04/25/2014   CREATININE 1.97* 04/25/2014    Assessment: Blood pressure control:  Not controlled Progress toward BP goal:   Not at goal Comments: He is on clonidine 0.3mg  BID, hydralazine 100mg  TID, HCTZ 25 mg daily, Losartan 100mg  daily, labetalol 300mg  BID. He assures compliance with this medications. He had very upsetting news prior to this visit with BP elevated.   Plan: Medications:  continue current medications Educational resources provided:   Self management tools provided:   Other plans: He has blood pressure monitor at home. He was advised to check his BP every day and bring this information with him to his next visit before we make medication changes. He agreed with this plan.

## 2014-05-02 NOTE — Addendum Note (Signed)
Addended by: Adele Barthel D on: 05/02/2014 11:11 PM   Modules accepted: Orders

## 2014-05-02 NOTE — Progress Notes (Signed)
   Subjective:    Patient ID: Charles Hart., male    DOB: 1956/04/08, 58 y.o.   MRN: [name]  HPI [name] is a 58 year old man with PMH of HTN, CHF, CAD, DM2, hx of CVA, who presents for follow up visit for his diabetes and blood pressure.  He states he has been taking his antihypertensives and took them this morning. His disability check will be mailed again to him and he will have funds to fill his prescriptions for next month. He is avoiding sugary drinks but still drinks gatorate. He requests a form to be filled for his diabetic shoes.  He states that he received upsetting news on the way here, his daughter, who is in college is hospitalized with food poisoning but in fair state.   Review of Systems  Constitutional: Negative for fever, chills, diaphoresis, activity change, appetite change, fatigue and unexpected weight change.  Respiratory: Negative for cough and shortness of breath.   Cardiovascular: Negative for chest pain, palpitations and leg swelling.  Gastrointestinal: Negative for vomiting, abdominal pain and diarrhea.  Endocrine: Negative for polydipsia and polyuria.  Genitourinary: Negative for dysuria.  Skin: Negative for color change.  Neurological: Negative for dizziness, weakness, light-headedness and headaches.  Psychiatric/Behavioral: Negative for agitation.       Objective:   Physical Exam  Nursing note and vitals reviewed. Constitutional: He is oriented to person, place, and time. He appears well-developed and well-nourished. No distress.  Cardiovascular: Normal rate and regular rhythm.   Pulmonary/Chest: Effort normal and breath sounds normal. No respiratory distress. He has no wheezes. He has no rales.  Abdominal: Soft. There is no tenderness.  Musculoskeletal: He exhibits no edema and no tenderness.  Neurological: He is alert and oriented to person, place, and time. Coordination normal.  Skin: Skin is warm and dry. He is not diaphoretic.    Psychiatric: He has a normal mood and affect.          Assessment & Plan:

## 2014-05-02 NOTE — Assessment & Plan Note (Addendum)
Lab Results  Component Value Date   HGBA1C 11.0 04/25/2014   HGBA1C 9.3 01/24/2014   HGBA1C 6.0 10/16/2013     Assessment: Diabetes control:  Not controlled Progress toward A1C goal:   Not at goal Comments: He reports that he used Lantus 24 units last night and 25 units this morning--I am not sure how reliable a historian he is today given that he is upset about the news from his daughter. He does not have his CBG meter with him today. He tells me his BS have been in the 200s in the morning, he checks it only once per day maybe every other day. He had eggs and bacon for breakfast but no lunch his CBG is 430 but he is asymptomatic. He has reported drinking Gatorade daily and it is unclear if he has cut back on this. Plan: Medications:  continue current medications Home glucose monitoring: Frequency:   Timing:   Instruction/counseling given: reminded to bring blood glucose meter & log to each visit and reminded to bring medications to each visit Educational resources provided: handout Self management tools provided:   Other plans: He was advised to check his BS at least 3 times per day, before breakfast, after lunch, after dinner. He likely needs Novolog instead of Lantus BID but he needs to check his BS more often before we can safely transition him to short-acting insulin. He will see Debera Lat. Follow up in 2 weeks. Filled out form for diabetic shoes during this visit. Referral to CSW for [facility] evaluation.

## 2014-05-05 ENCOUNTER — Telehealth: Payer: Self-pay | Admitting: Pharmacist

## 2014-05-05 LAB — GLUCOSE, CAPILLARY: GLUCOSE-CAPILLARY: 464 mg/dL — AB (ref 70–99)

## 2014-05-05 NOTE — Progress Notes (Signed)
INTERNAL MEDICINE TEACHING ATTENDING ADDENDUM - Novah Goza, MD: I reviewed and discussed at the time of visit with the resident Dr. Kennerly, the patient's medical history, physical examination, diagnosis and results of pertinent tests and treatment and I agree with the patient's care as documented.  

## 2014-05-06 NOTE — Telephone Encounter (Signed)
Updated medication list with current lantus dose and hydralazine dose clarified with patient and pharmacy.

## 2014-05-16 ENCOUNTER — Ambulatory Visit (INDEPENDENT_AMBULATORY_CARE_PROVIDER_SITE_OTHER): Payer: PRIVATE HEALTH INSURANCE | Admitting: Dietician

## 2014-05-16 ENCOUNTER — Encounter: Payer: Self-pay | Admitting: Internal Medicine

## 2014-05-16 ENCOUNTER — Ambulatory Visit (INDEPENDENT_AMBULATORY_CARE_PROVIDER_SITE_OTHER): Payer: PRIVATE HEALTH INSURANCE | Admitting: Internal Medicine

## 2014-05-16 VITALS — BP 156/73 | HR 63 | Temp 97.8°F | Ht 72.0 in | Wt 214.7 lb

## 2014-05-16 DIAGNOSIS — IMO0001 Reserved for inherently not codable concepts without codable children: Secondary | ICD-10-CM

## 2014-05-16 DIAGNOSIS — E1165 Type 2 diabetes mellitus with hyperglycemia: Secondary | ICD-10-CM

## 2014-05-16 DIAGNOSIS — Z0000 Encounter for general adult medical examination without abnormal findings: Secondary | ICD-10-CM | POA: Insufficient documentation

## 2014-05-16 DIAGNOSIS — I10 Essential (primary) hypertension: Secondary | ICD-10-CM

## 2014-05-16 MED ORDER — INSULIN ASPART 100 UNIT/ML FLEXPEN: 5.0000 [IU] | PEN_INJECTOR | Freq: Three times a day (TID) | SUBCUTANEOUS | Status: DC

## 2014-05-16 MED ORDER — HYDRALAZINE HCL 50 MG PO TABS: 100.0000 mg | ORAL_TABLET | Freq: Three times a day (TID) | ORAL | Status: DC

## 2014-05-16 MED ORDER — INSULIN PEN NEEDLE 31G X 5 MM MISC: Status: DC

## 2014-05-16 NOTE — Progress Notes (Signed)
Patient ID: Charles Hart., male   DOB: [birth year], 57 y.o.   MRN: [id number]  Subjective:   Patient ID: Charles Hart Sr. male   DOB: [birth year] 58 y.o.   MRN: [id number]  HPI: Charles Hart. is a 58 y.o. M w/ PMH HTN, CHF, CAD, DM2, hx of CVA who presents for f/u for his DM2.  He was seen on 9/18 and was asked to check his CBGs TID b/f meals and keep a log of his readings. He did bring in his meter today, which only had 3 readings on it, all of which are over 400. He is currently supposed to be taking Lantus 20u qhs, but is taking between 22-25u qhs. He was seen by our clinic dietician and diabetes coordinator today who recommended starting rapid acting insulin before meals with a sliding scale based on his CBG readings.   His diet has been "good." He states that he ate grits, cheese, eggs, and bacon this morning. He did not eat lunch. He has stopped drinking Gatorade.   He denies fevers, chills, headaches, vision changes, chest pain, SOB, abd pain, N/V/D, or changes in urination. He endorses occasional peripheral edema.    Past Medical History  Diagnosis Date  . Dermatitis   . CHF (congestive heart failure)     LV function improved from 2004 to 2008.  Historically, moderately dilated LV with EF 30-40% by 2D echo 08/14/2002.  Mild CAD with severe LV dysfunction by cardiac cath 09/2002.  Normal coronary arteries and normal LV function by cardiac cath 09/19/2006.  A 2-D echo on 04/01/2009 showed mild concentric hypertrophy and normal systolic (LVEF  54-27%) and doppler C/W with grade 1 diastolic dysfunction.  . Hypertension   . Diabetes mellitus   . Hyperlipidemia   . Hearing loss in right ear   . Cardiomyopathy     LV function improved from 2004 to 2008.  Historically, moderately dilated LV with EF 30-40% by 2D echo 08/14/2002.  Mild CAD with severe LV dysfunction by cardiac cath 09/2002.  Normal coronary arteries and normal LV function by cardiac cath 09/19/2006.  A 2-D echo  on 04/01/2009 showed mild concentric hypertrophy and normal systolic (LVEF  06-23%) and doppler C/W with grade 1 diastolic dysfunction.  . DM neuropathy, painful   . CVA (cerebral vascular accident) 07/04/2012    MRI of the brain 07/04/2012 showed an acute infarct in the right basal ganglia involving the anterior putamen, anterior limb internal capsule, and head of the caudate; this measured approximately 2.5 cm in diameter.     . Hypertensive crisis 07/28/2012   Current Outpatient Prescriptions  Medication Sig Dispense Refill  . ACCU-CHEK FASTCLIX LANCETS MISC Check blood sugar as directed up to 3 times a day dx code 250.00  102 each  5  . Blood Glucose Monitoring Suppl (ACCU-CHEK NANO SMARTVIEW) W/DEVICE KIT Check blood sugar as directed up to 3 times a day dx code 250.00  1 kit  0  . cloNIDine (CATAPRES) 0.3 MG tablet Take 1 tablet (0.3 mg total) by mouth 2 (two) times daily.  180 tablet  1  . clopidogrel (PLAVIX) 75 MG tablet Take 1 tablet (75 mg total) by mouth every morning.  90 tablet  2  . digoxin (LANOXIN) 0.25 MG tablet Take 1 tablet (0.25 mg total) by mouth every morning.  90 tablet  2  . fluticasone (FLONASE) 50 MCG/ACT nasal spray Place 2 sprays into both nostrils daily.  16 g  1  .  gabapentin (NEURONTIN) 300 MG capsule Take 1 capsule (300 mg total) by mouth every morning.  90 capsule  2  . glucose blood (ACCU-CHEK SMARTVIEW) test strip Check blood sugar as directed up to 3 times a day dx code 250.00  100 each  5  . guaiFENesin-codeine 100-10 MG/5ML syrup Take 10 mLs by mouth every 6 (six) hours as needed for cough.  120 mL  0  . hydrALAZINE (APRESOLINE) 50 MG tablet Take 2 tablets (100 mg total) by mouth 3 (three) times daily.  90 tablet  6  . hydrochlorothiazide (HYDRODIURIL) 25 MG tablet Take 1 tablet (25 mg total) by mouth daily.  90 tablet  2  . Insulin Glargine (LANTUS) 100 UNIT/ML Solostar Pen Inject 20 Units into the skin at bedtime.      [name] labetalol (NORMODYNE) 300 MG tablet  Take 1 tablet (300 mg total) by mouth 2 (two) times daily.  180 tablet  3  . losartan (COZAAR) 50 MG tablet Take 2 tablets (100 mg total) by mouth every morning.  60 tablet  3  . Multiple Vitamin (MULITIVITAMIN WITH MINERALS) TABS Take 1 tablet by mouth every morning.       . pravastatin (PRAVACHOL) 40 MG tablet Take 1 tablet (40 mg total) by mouth daily.  90 tablet  1   No current facility-administered medications for this visit.   Family History  Problem Relation Age of Onset  . Colon cancer Sister   . Aneurysm Father 58    died of rupture   History   Social History  . Marital Status: Married    Spouse Name: N/A    Number of Children: N/A  . Years of Education: N/A   Social History Main Topics  . Smoking status: Never Smoker   . Smokeless tobacco: Never Used  . Alcohol Use: Yes     Comment: Wine occasionally (no more than 2 glasses per month)  . Drug Use: No  . Sexual Activity: None   Other Topics Concern  . None   Social History Narrative  . None   Review of Systems: A 12 point ROS was performed; pertinent positives and negatives were noted in the HPI   Objective:  Physical Exam: Filed Vitals:   05/16/14 1531  BP: 156/73  Pulse: 63  Temp: 97.8 F (36.6 C)  TempSrc: Oral  Height: 6' (1.829 m)  Weight: 214 lb 11.2 oz (97.387 kg)  SpO2: 98%   Constitutional: Vital signs reviewed.  Patient is a well-developed and well-nourished male in no acute distress and cooperative with exam. Alert and oriented x3.  Head: Normocephalic and atraumatic Eyes: PERRL, EOMI Cardiovascular: RRR, no MRG Pulmonary/Chest: Normal respiratory effort, CTAB, no wheezes, rales, or rhonchi Abdominal: Soft. Non-tender, non-distended Musculoskeletal: Moves all 4 extremities. Neurological: A&O x3, cranial nerve II-XII are grossly intact, no focal deficits Skin: Warm, dry and intact. 1+ pitting edema to BLE.  Psychiatric: Normal mood and affect. speech and behavior is normal.  Assessment  & Plan:   Please refer to Problem List based Assessment and Plan

## 2014-05-16 NOTE — Assessment & Plan Note (Addendum)
Lab Results  Component Value Date   HGBA1C 11.0 04/25/2014   HGBA1C 9.3 01/24/2014   HGBA1C 6.0 10/16/2013     Assessment: Diabetes control: poor control (HgbA1C >9%) Progress toward A1C goal:  unchanged Comments: Pt only checked CBG 3x since last clinic visit, values all >400. He is takign 22-24u Lantus qhs. He did see Debera Lat, our clinic diabetes educator who recommended starting mealtime coverage at breakfast and dinner, as the pt rarely eats lunch.   Plan: Medications:  continue current medications, of Lantus 20u qhs and begin mealtime coverage with Novolog. Scale noted below. Home glucose monitoring: Frequency:  BID  Timing:   Before breakfast and dinner Instruction/counseling given: reminded to bring blood glucose meter & log to each visit and discussed diet Educational resources provided: brochure;handout Self management tools provided: copy of home glucose meter download Other plans: F/u in 2 weeks  Rapid acting insulin mealtime coverage scale:      < 150 take 5 units novolog 151- 200 inject 6 units Novolog 201- 250 inject 7 units Novolog 251- 300 inject 8 units Novolog 301 - 350 inject 9 units Novolog 351 - 400 inject 10 units Novolog 401 - 450 inject 11 units Novolog 451 - 500 inject 12 units Novolog 501 - 550 inject 13 units Novolog 551 - 600 inject 14 units Novolog  > 600 inject 15 units and call office, if office is closed call [phone]; after hours resident for instructions.

## 2014-05-16 NOTE — Patient Instructions (Addendum)
Try to eat about the same amount of carbs for each meal:  Eating about the same amount of carbs at meals and snacks should help your blood sugars be more stable helping make it easier to control them. About 5 carb choices or 75 grams at each meal and 1-2 carb choices or 15-30 grams carb for snacks   Examples:  2 starch + 2 fruit + 1 dairy = 5 carb choices ) = 75 grams carb    2 starch + 2 dairy + 1 fruit = 5 carb choices = 75 grams carb    3 starch + 2 dairy = 5 carb choices = 75 grams carb    2 starch + 1 dairy + 2 fruit = 5 carb choices = 75 grams carb    3 starch + 2 fruit = 5 carb choices = 75 grams carb    5 starches = 5 carb choices=75 grams carb  Veggies, healthy fats like nuts  and meats do not count- add them as needed.   Check blood sugar before each meal. < 150 take 5 units novolog 151- 200 inject 6 units Novolog 201- 250 inject 7 units Novolog 251- 300 inject 8 units Novolog 301 - 350 inject 9 units Novolog 351 - 400 inject 10 units Novolog 401 - 450 inject 11 units Novolog 451 - 500 inject 12 units Novolog 501 - 550 inject 13 units Novolog 551 - 600 inject 14 units Novolog  > 600 inject 15 units and call office, if office is closed call [phone]; after hours resident for instructions.    General Instructions:   Please bring your medicines with you each time you come to clinic.  Medicines may include prescription medications, over-the-counter medications, herbal remedies, eye drops, vitamins, or other pills.   Progress Toward Treatment Goals:  Treatment Goal 05/02/2014  Hemoglobin A1C unchanged  Blood pressure unchanged    Self Care Goals & Plans:  Self Care Goal 05/16/2014  Manage my medications take my medicines as prescribed; bring my medications to every visit; refill my medications on time; follow the sick day instructions if I am sick  Monitor my health keep track of my blood glucose; bring my glucose meter and log to each visit; keep track of my blood  pressure; keep track of my weight; check my feet daily  Eat healthy foods eat more vegetables; eat fruit for snacks and desserts; eat smaller portions; drink diet soda or water instead of juice or soda  Be physically active find an activity I enjoy  Meeting treatment goals -    Home Blood Glucose Monitoring 05/02/2014  Check my blood sugar once a day  When to check my blood sugar -     Care Management & Community Referrals:  Referral 05/02/2014  Referrals made for care management support none needed  Referrals made to community resources -

## 2014-05-17 ENCOUNTER — Encounter (HOSPITAL_COMMUNITY): Payer: Self-pay | Admitting: Emergency Medicine

## 2014-05-17 DIAGNOSIS — H9191 Unspecified hearing loss, right ear: Secondary | ICD-10-CM | POA: Insufficient documentation

## 2014-05-17 DIAGNOSIS — E1165 Type 2 diabetes mellitus with hyperglycemia: Secondary | ICD-10-CM | POA: Diagnosis present

## 2014-05-17 DIAGNOSIS — E785 Hyperlipidemia, unspecified: Secondary | ICD-10-CM | POA: Insufficient documentation

## 2014-05-17 DIAGNOSIS — Z872 Personal history of diseases of the skin and subcutaneous tissue: Secondary | ICD-10-CM | POA: Diagnosis not present

## 2014-05-17 DIAGNOSIS — I509 Heart failure, unspecified: Secondary | ICD-10-CM | POA: Insufficient documentation

## 2014-05-17 DIAGNOSIS — Z8673 Personal history of transient ischemic attack (TIA), and cerebral infarction without residual deficits: Secondary | ICD-10-CM | POA: Diagnosis not present

## 2014-05-17 DIAGNOSIS — E1140 Type 2 diabetes mellitus with diabetic neuropathy, unspecified: Secondary | ICD-10-CM | POA: Diagnosis not present

## 2014-05-17 DIAGNOSIS — I10 Essential (primary) hypertension: Secondary | ICD-10-CM | POA: Diagnosis not present

## 2014-05-17 LAB — CBG MONITORING, ED: GLUCOSE-CAPILLARY: 498 mg/dL — AB (ref 70–99)

## 2014-05-17 NOTE — ED Notes (Signed)
Patient here with hyperglycemia. States that it has been ongoing for 3 days. Was seen by PCP 1 day ago and started on short-acting insulin. Prior to that patient was only taking long-acting insulin. Blood sugars have been >400 with one reading >600 at home. Presents tonight because he is concerned that the blood sugars have not corrected. Was started on sliding scale novolog. Patient ate tonight around 2100 and took his correction dose of novolog at that time. CBG currently 498.

## 2014-05-18 ENCOUNTER — Emergency Department (HOSPITAL_COMMUNITY)
Admission: EM | Admit: 2014-05-18 | Discharge: 2014-05-18 | Disposition: A | Discharge status: Home / Self Care | Payer: PRIVATE HEALTH INSURANCE | Attending: Emergency Medicine | Admitting: Emergency Medicine

## 2014-05-18 DIAGNOSIS — R739 Hyperglycemia, unspecified: Secondary | ICD-10-CM

## 2014-05-18 LAB — URINALYSIS, ROUTINE W REFLEX MICROSCOPIC
BILIRUBIN URINE: NEGATIVE
Glucose, UA: >1000 mg/dL — AB
Hgb urine dipstick: NEGATIVE
Ketones, ur: NEGATIVE mg/dL
LEUKOCYTES UA: NEGATIVE
NITRITE: NEGATIVE
Protein, ur: 100 mg/dL — AB
Specific Gravity, Urine: 1.02 (ref 1.005–1.030)
Urobilinogen, UA: 1 mg/dL (ref 0.0–1.0)
pH: 5 (ref 5.0–8.0)

## 2014-05-18 LAB — COMPREHENSIVE METABOLIC PANEL
ALT: 12 U/L (ref 0–53)
ANION GAP: 11 (ref 5–15)
AST: 18 U/L (ref 0–37)
Albumin: 3.2 g/dL — ABNORMAL LOW (ref 3.5–5.2)
Alkaline Phosphatase: 80 U/L (ref 39–117)
BUN: 31 mg/dL — AB (ref 6–23)
CO2: 29 mEq/L (ref 19–32)
Calcium: 8.4 mg/dL (ref 8.4–10.5)
Chloride: 88 mEq/L — ABNORMAL LOW (ref 96–112)
Creatinine, Ser: 2.77 mg/dL — ABNORMAL HIGH (ref 0.50–1.35)
GFR calc Af Amer: 28 mL/min — ABNORMAL LOW (ref >90)
GFR calc non Af Amer: 24 mL/min — ABNORMAL LOW (ref >90)
Glucose, Bld: 468 mg/dL — ABNORMAL HIGH (ref 70–99)
Potassium: 4.3 mEq/L (ref 3.7–5.3)
SODIUM: 128 meq/L — AB (ref 137–147)
TOTAL PROTEIN: 6.5 g/dL (ref 6.0–8.3)
Total Bilirubin: 0.3 mg/dL (ref 0.3–1.2)

## 2014-05-18 LAB — CBC
HCT: 30.3 % — ABNORMAL LOW (ref 39.0–52.0)
Hemoglobin: 10.6 g/dL — ABNORMAL LOW (ref 13.0–17.0)
MCH: 27.9 pg (ref 26.0–34.0)
MCHC: 35 g/dL (ref 30.0–36.0)
MCV: 79.7 fL (ref 78.0–100.0)
PLATELETS: 224 10*3/uL (ref 150–400)
RBC: 3.8 MIL/uL — ABNORMAL LOW (ref 4.22–5.81)
RDW: 12.2 % (ref 11.5–15.5)
WBC: 4 10*3/uL (ref 4.0–10.5)

## 2014-05-18 LAB — CBG MONITORING, ED: Glucose-Capillary: 299 mg/dL — ABNORMAL HIGH (ref 70–99)

## 2014-05-18 LAB — URINE MICROSCOPIC-ADD ON

## 2014-05-18 MED ORDER — INSULIN ASPART 100 UNIT/ML ~~LOC~~ SOLN
10.0000 [IU] | Freq: Once | SUBCUTANEOUS | Status: AC
  Administered 2014-05-18: 10 [IU] via INTRAVENOUS

## 2014-05-18 MED ORDER — INSULIN ASPART 100 UNIT/ML ~~LOC~~ SOLN
10.0000 [IU] | Freq: Once | SUBCUTANEOUS | Status: DC
  Filled 2014-05-18: qty 1

## 2014-05-18 MED ORDER — SODIUM CHLORIDE 0.9 % IV BOLUS (SEPSIS)
1000.0000 mL | Freq: Once | INTRAVENOUS | Status: AC
  Administered 2014-05-18: 1000 mL via INTRAVENOUS

## 2014-05-18 NOTE — ED Notes (Addendum)
GUL 299

## 2014-05-18 NOTE — ED Provider Notes (Signed)
TIME SEEN: 1:55 AM  CHIEF COMPLAINT: Hyperglycemia  HPI: Patient is a 58 y.o. M with history of CHF, hypertension, hyperlipidemia, insulin-dependent diabetes, CVA who presents emergency department with hyperglycemia. Patient reports he is on Lantus 20 units at night. He was recently started on NovoLog sliding scale insulin. He reports that today his glucose was greater than 500. He is otherwise feeling well. No fevers, chills, chest pain or shortness of breath, cough, vomiting or diarrhea, dysuria or hematuria, rash.  ROS: See HPI Constitutional: no fever  Eyes: no drainage  ENT: no runny nose   Cardiovascular:  no chest pain  Resp: no SOB  GI: no vomiting GU: no dysuria Integumentary: no rash  Allergy: no hives  Musculoskeletal: no leg swelling  Neurological: no slurred speech ROS otherwise negative  PAST MEDICAL HISTORY/PAST SURGICAL HISTORY:  Past Medical History  Diagnosis Date  . Dermatitis   . CHF (congestive heart failure)     LV function improved from 2004 to 2008.  Historically, moderately dilated LV with EF 30-40% by 2D echo 08/14/2002.  Mild CAD with severe LV dysfunction by cardiac cath 09/2002.  Normal coronary arteries and normal LV function by cardiac cath 09/19/2006.  A 2-D echo on 04/01/2009 showed mild concentric hypertrophy and normal systolic (LVEF  40-76%) and doppler C/W with grade 1 diastolic dysfunction.  . Hypertension   . Diabetes mellitus   . Hyperlipidemia   . Hearing loss in right ear   . Cardiomyopathy     LV function improved from 2004 to 2008.  Historically, moderately dilated LV with EF 30-40% by 2D echo 08/14/2002.  Mild CAD with severe LV dysfunction by cardiac cath 09/2002.  Normal coronary arteries and normal LV function by cardiac cath 09/19/2006.  A 2-D echo on 04/01/2009 showed mild concentric hypertrophy and normal systolic (LVEF  80-88%) and doppler C/W with grade 1 diastolic dysfunction.  . DM neuropathy, painful   . CVA (cerebral vascular  accident) 07/04/2012    MRI of the brain 07/04/2012 showed an acute infarct in the right basal ganglia involving the anterior putamen, anterior limb internal capsule, and head of the caudate; this measured approximately 2.5 cm in diameter.     . Hypertensive crisis 07/28/2012    MEDICATIONS:  Prior to Admission medications   Medication Sig Start Date End Date Taking? Authorizing Provider  ACCU-CHEK FASTCLIX LANCETS MISC Check blood sugar as directed up to 3 times a day dx code 250.00 09/05/13   Axel Filler, MD  Blood Glucose Monitoring Suppl (ACCU-CHEK NANO SMARTVIEW) W/DEVICE KIT Check blood sugar as directed up to 3 times a day dx code 250.00 09/05/13   Axel Filler, MD  cloNIDine (CATAPRES) 0.3 MG tablet Take 1 tablet (0.3 mg total) by mouth 2 (two) times daily. 01/24/14   Corky Sox, MD  clopidogrel (PLAVIX) 75 MG tablet Take 1 tablet (75 mg total) by mouth every morning. 01/24/14   Corky Sox, MD  digoxin (LANOXIN) 0.25 MG tablet Take 1 tablet (0.25 mg total) by mouth every morning. 01/24/14   Corky Sox, MD  fluticasone (FLONASE) 50 MCG/ACT nasal spray Place 2 sprays into both nostrils daily. 01/24/14 01/24/15  Corky Sox, MD  gabapentin (NEURONTIN) 300 MG capsule Take 1 capsule (300 mg total) by mouth every morning. 01/24/14   Corky Sox, MD  glucose blood (ACCU-CHEK SMARTVIEW) test strip Check blood sugar as directed up to 3 times a day dx code 250.00 09/05/13   Guerry Bruin  Joines, MD  guaiFENesin-codeine 100-10 MG/5ML syrup Take 10 mLs by mouth every 6 (six) hours as needed for cough. 01/24/14   Corky Sox, MD  hydrALAZINE (APRESOLINE) 50 MG tablet Take 2 tablets (100 mg total) by mouth 3 (three) times daily. 05/16/14   Otho Bellows, MD  hydrochlorothiazide (HYDRODIURIL) 25 MG tablet Take 1 tablet (25 mg total) by mouth daily. 01/24/14   Corky Sox, MD  insulin aspart (NOVOLOG FLEXPEN) 100 UNIT/ML FlexPen Inject 5 Units into the skin 3 (three) times daily with meals. Do not  take if you skip a meal (and do not eat carbohydrates) 05/16/14   Otho Bellows, MD  Insulin Glargine (LANTUS) 100 UNIT/ML Solostar Pen Inject 20 Units into the skin at bedtime. 01/24/14   Corky Sox, MD  Insulin Pen Needle 31G X 5 MM MISC Use to inject lantus once a day and Novolog three times a day 05/16/14   Otho Bellows, MD  labetalol (NORMODYNE) 300 MG tablet Take 1 tablet (300 mg total) by mouth 2 (two) times daily. 01/24/14   Corky Sox, MD  losartan (COZAAR) 50 MG tablet Take 2 tablets (100 mg total) by mouth every morning. 01/24/14   Corky Sox, MD  Multiple Vitamin (MULITIVITAMIN WITH MINERALS) TABS Take 1 tablet by mouth every morning.     Historical Provider, MD  pravastatin (PRAVACHOL) 40 MG tablet Take 1 tablet (40 mg total) by mouth daily. 01/24/14   Corky Sox, MD    ALLERGIES:  Allergies  Allergen Reactions  . Amlodipine Swelling  . Lisinopril Cough    SOCIAL HISTORY:  History  Substance Use Topics  . Smoking status: Never Smoker   . Smokeless tobacco: Never Used  . Alcohol Use: Yes     Comment: Wine occasionally (no more than 2 glasses per month)    FAMILY HISTORY: Family History  Problem Relation Age of Onset  . Colon cancer Sister   . Aneurysm Father 78    died of rupture    EXAM: BP 179/74  Pulse 79  Temp(Src) 98.2 F (36.8 C) (Oral)  Resp 20  Ht 6' (1.829 m)  Wt 211 lb (95.709 kg)  BMI 28.61 kg/m2  SpO2 99% CONSTITUTIONAL: Alert and oriented and responds appropriately to questions. Well-appearing; well-nourished HEAD: Normocephalic EYES: Conjunctivae clear, PERRL ENT: normal nose; no rhinorrhea; moist mucous membranes; pharynx without lesions noted NECK: Supple, no meningismus, no LAD  CARD: RRR; S1 and S2 appreciated; no murmurs, no clicks, no rubs, no gallops RESP: Normal chest excursion without splinting or tachypnea; breath sounds clear and equal bilaterally; no wheezes, no rhonchi, no rales,  ABD/GI: Normal bowel sounds;  non-distended; soft, non-tender, no rebound, no guarding BACK:  The back appears normal and is non-tender to palpation, there is no CVA tenderness EXT: Normal ROM in all joints; non-tender to palpation; no edema; normal capillary refill; no cyanosis    SKIN: Normal color for age and race; warm NEURO: Moves all extremities equally PSYCH: The patient's mood and manner are appropriate. Grooming and personal hygiene are appropriate.  MEDICAL DECISION MAKING: Patient here with a symptomatic hyperglycemia. We'll treat with IV fluids and insulin. We'll obtain labs to ensure patient is not in DKA, urinalysis.  ED PROGRESS: Patient is not currently in DKA. Urine shows no sign of infection. He still well-appearing, hemodynamically stable. Glucose has improved with IV fluids and insulin. He states he would like to be discharged home. Discussed with him that I  recommend close followup with his PCP as he may need his Lantus increased. Discussed supportive care instructions, recommend avoiding carbohydrates. Patient verbalizes understanding and is comfortable with plan.     [place], DO 05/18/14 [phone]

## 2014-05-18 NOTE — ED Notes (Signed)
Dr. Leonides Schanz present at the bedside.

## 2014-05-18 NOTE — ED Notes (Signed)
Discussed with Dr. Leonides Schanz that the patient's blood sugar has decreased, but that blood pressure has increased to [id number] systolic.  Patient is asymptomatic. MD acknowledges, no new orders. Will prepare for discharge.

## 2014-05-18 NOTE — ED Notes (Signed)
Clarified with Dr. Leonides Schanz, patient is still to receive 2L nasal cannula

## 2014-05-18 NOTE — Discharge Instructions (Signed)
Hyperglycemia °Hyperglycemia occurs when the glucose (sugar) in your blood is too high. Hyperglycemia can happen for many reasons, but it most often happens to people who do not know they have diabetes or are not managing their diabetes properly.  °CAUSES  °Whether you have diabetes or not, there are other causes of hyperglycemia. Hyperglycemia can occur when you have diabetes, but it can also occur in other situations that you might not be as aware of, such as: °Diabetes °· If you have diabetes and are having problems controlling your blood glucose, hyperglycemia could occur because of some of the following reasons: °¨ Not following your meal plan. °¨ Not taking your diabetes medications or not taking it properly. °¨ Exercising less or doing less activity than you normally do. °¨ Being sick. °Pre-diabetes °· This cannot be ignored. Before people develop Type 2 diabetes, they almost always have "pre-diabetes." This is when your blood glucose levels are higher than normal, but not yet high enough to be diagnosed as diabetes. Research has shown that some long-term damage to the body, especially the heart and circulatory system, may already be occurring during pre-diabetes. If you take action to manage your blood glucose when you have pre-diabetes, you may delay or prevent Type 2 diabetes from developing. °Stress °· If you have diabetes, you may be "diet" controlled or on oral medications or insulin to control your diabetes. However, you may find that your blood glucose is higher than usual in the hospital whether you have diabetes or not. This is often referred to as "stress hyperglycemia." Stress can elevate your blood glucose. This happens because of hormones put out by the body during times of stress. If stress has been the cause of your high blood glucose, it can be followed regularly by your caregiver. That way he/she can make sure your hyperglycemia does not continue to get worse or progress to  diabetes. °Steroids °· Steroids are medications that act on the infection fighting system (immune system) to block inflammation or infection. One side effect can be a rise in blood glucose. Most people can produce enough extra insulin to allow for this rise, but for those who cannot, steroids make blood glucose levels go even higher. It is not unusual for steroid treatments to "uncover" diabetes that is developing. It is not always possible to determine if the hyperglycemia will go away after the steroids are stopped. A special blood test called an A1c is sometimes done to determine if your blood glucose was elevated before the steroids were started. °SYMPTOMS °· Thirsty. °· Frequent urination. °· Dry mouth. °· Blurred vision. °· Tired or fatigue. °· Weakness. °· Sleepy. °· Tingling in feet or leg. °DIAGNOSIS  °Diagnosis is made by monitoring blood glucose in one or all of the following ways: °· A1c test. This is a chemical found in your blood. °· Fingerstick blood glucose monitoring. °· Laboratory results. °TREATMENT  °First, knowing the cause of the hyperglycemia is important before the hyperglycemia can be treated. Treatment may include, but is not be limited to: °· Education. °· Change or adjustment in medications. °· Change or adjustment in meal plan. °· Treatment for an illness, infection, etc. °· More frequent blood glucose monitoring. °· Change in exercise plan. °· Decreasing or stopping steroids. °· Lifestyle changes. °HOME CARE INSTRUCTIONS  °· Test your blood glucose as directed. °· Exercise regularly. Your caregiver will give you instructions about exercise. Pre-diabetes or diabetes which comes on with stress is helped by exercising. °· Eat wholesome,   balanced meals. Eat often and at regular, fixed times. Your caregiver or nutritionist will give you a meal plan to guide your sugar intake.  Being at an ideal weight is important. If needed, losing as little as 10 to 15 pounds may help improve blood  glucose levels. SEEK MEDICAL CARE IF:   You have questions about medicine, activity, or diet.  You continue to have symptoms (problems such as increased thirst, urination, or weight gain). SEEK IMMEDIATE MEDICAL CARE IF:   You are vomiting or have diarrhea.  Your breath smells fruity.  You are breathing faster or slower.  You are very sleepy or incoherent.  You have numbness, tingling, or pain in your feet or hands.  You have chest pain.  Your symptoms get worse even though you have been following your caregiver's orders.  If you have any other questions or concerns. Document Released: 01/25/2001 Document Revised: 10/24/2011 Document Reviewed: 11/28/2011 [facility] Patient Information 2015 [place], [place]. This information is not intended to replace advice given to you by your health care provider. Make sure you discuss any questions you have with your health care provider.  Correction Insulin Your health care provider has decided you need to take insulin regularly. You have been given a correction scale (also called a sliding scale) in case you need extra insulin when your blood sugar is too high (hyperglycemia). The following instructions will assist you in how to use that correction scale.  WHAT IS A CORRECTION SCALE?  When you check your blood sugar, sometimes it will be higher than your health care provider has told you it should be. You may need an extra dose of insulin to bring your blood sugar to the recommended level (also known as your goal, target, or normal level). The correction scale is prescribed by your health care provider based on your specific needs.  Your correction scale has two parts:   The first shows you a blood sugar range.   The second part tells you how much extra insulin to give yourself if your blood sugar falls within this range. You will not need an extra dose of insulin if your blood glucose is in the desired range. You should simply give yourself  the normal amount of insulin that your health care provider has ordered for you.  WHY IS IT IMPORTANT TO KEEP YOUR BLOOD SUGAR LEVELS AT YOUR DESIRED LEVEL?  Keeping your blood sugar at the desired level helps to prevent long-term complications of diabetes, such as eye disease, kidney failure, nerve damage, and other serious complications. WHAT TYPE OF INSULIN WILL YOU USE?  To help bring down blood sugar levels that are too high, your health care provider will prescribe a short-acting or a rapid-acting insulin. An example of a short-acting insulin would be regular insulin. Remember, you may also have a longer-acting insulin prescribed for you.  WHAT DO YOU NEED TO DO?   Check your blood sugar with your home blood glucose meter as recommended by your health care provider.   Using your correction scale, find the range that your blood sugar lies in.   Look for the units of insulin that match that blood sugar range. Give yourself the dose of correction insulin your health care provider has prescribed. Always make sure you are using the right type of insulin.   Prior to the injection, make sure you have food available that you can eat in the next 15-30 minutes.   If your correction insulin is rapid acting, start  eating your meal within 15 minutes after you have given yourself the insulin injection. If you wait longer than 15 minutes to eat, your blood sugar might get too low.   If your correction insulin is short acting(regular), start eating your meal within 30 minutes after you have given yourself the insulin injection. If you wait longer than 30 minutes to eat, your blood sugar might get too low. Symptoms of low blood sugar (hypoglycemia) may include feeling shaky or weak, sweating, feeling confused, difficulty seeing, agitation, crankiness, or numbness of the lips or tongue. Check your blood sugar immediately and treat your results as directed by your health care provider.   Keep a log  of your blood sugar results with the time you took the test and the amount of insulin that you injected. This information will help your health care provider manage your medicines.   Note on your log anything that may affect your blood sugar level, such as:   Changes in normal exercise or activity.   Changes in your normal schedule, such as staying up late, going on vacation, changing your diet, or holidays.   New medicines. This includes prescription and over-the-counter medicines. Some medicines may cause high blood sugar.   Sickness, stress, or anxiety.   Changes in the time you took your medicine.   Changes in your meals, such as skipping a meal, having a late meal, or dining out.   Eating things that may affect blood glucose, such as snacks, meal portions that are larger than normal, drinks with sugar, or eating less than usual.   Ask your health care provider any questions you have.  Be aware of "stacking" your insulin doses. This happens when you correct a high blood sugar level by giving yourself extra insulin too soon after a previous correction dose or mealtime dose. You may then have too much insulin still active in your body and may be at risk for hypoglycemia. WHY DO YOU NEED A CORRECTION SCALE IF YOU HAVE NEVER BEEN DIAGNOSED WITH DIABETES?   Keeping your blood glucose in the target range is important for your overall health.   You may have been prescribed medicines that cause your blood glucose to be higher than normal. WHEN SHOULD YOU SEEK MEDICAL CARE? Contact your health care provider if:   You have experienced hypoglycemia that you are unable to treat with your usual routine.   You have a high blood sugar level that is not coming down with the correction dose.  Your blood sugar is often too low or does not come up even if you eat a fast-acting carbohydrate. Someone who lives with you should seek immediate medical care if you become  unresponsive. Document Released: 12/23/2010 Document Revised: 04/03/2013 Document Reviewed: 01/11/2013 [facility] Patient Information 2015 [place], [place]. This information is not intended to replace advice given to you by your health care provider. Make sure you discuss any questions you have with your health care provider.

## 2014-05-19 NOTE — Progress Notes (Signed)
INTERNAL MEDICINE TEACHING ATTENDING ADDENDUM - Bethany Hirt, MD: I reviewed and discussed at the time of visit with the resident Dr. Glenn, the patient's medical history, physical examination, diagnosis and results of pertinent tests and treatment and I agree with the patient's care as documented.  

## 2014-05-20 ENCOUNTER — Telehealth: Payer: Self-pay | Admitting: Dietician

## 2014-05-20 ENCOUNTER — Encounter: Payer: Self-pay | Admitting: Dietician

## 2014-05-20 NOTE — Progress Notes (Addendum)
Medical Nutrition Therapy:  Appt start time: 1430 end time:  [id number].  Assessment:  Primary concerns today: Blood sugar control.  Patient is planning to start meal time insulin and he was asked to see CDE prior to doing this. He eats two meals a day most days.  Learning Readiness: Contemplating/Ready Barriers to learning: is falling asleep at visit, but insists he is fine to meet and drive home Usual eating pattern includes 2 meals and 1-2 snacks per day. Frequent foods and beverages include coffee, water, sweet tea.  Avoided foods: none noted today.   Usual physical activity includes stays busy, but no formal exercise schedule. 24-hr recall: B ( AM)-  Eats ~ 50 grams carb L- sometimes eats a snack midday    D ( PM)- Eats ~ 50 grams carb   Beverages- says sweet tea  And juice are problems for him Typical day? No.- suspect he eats and drink more than reported  Medications: Metformin stopped 01/2014,creatinine > 2, A1C was already increased at that time and at follow up in 04/2014 A1C increased from 9 to 12.  . 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   Nutritional Diagnosis:  NB-1.1 Food and nutrition-related knowledge deficit As related to lack of carb counting skills and prior knoweldge.  As evidenced by his report and no previous nutrition or diabetes training sessions in past few years per patient. .    Intervention:  Nutrition education on carb counting emphasizing consistency and timing of meal time insulin with food. Handouts given during visit include: AVS in doctor's visit Coordination of care- starting low with mealtime insulin in setting of likely glucose toxicity, correction scale should provide him with additional coverage until able to readjust doses. He will probably need  titration of both basal and bolus insulin with TDD and ratios being adjusted at least weekly to maintain ~ 50% basal:50% bolus split between the two.   Demonstrated degree of understanding via:  Teach Back   Monitoring/Evaluation:  Dietary intake, exercise, meter, and body weight in 2 week(s).

## 2014-05-20 NOTE — Patient Instructions (Signed)
Eating about the same amount of carbs at meals and snacks should help your blood sugars. About 5 carb choices at each meal and 1-2 for snacks may work for you   Examples:  2 starch + 2 fruit + 1 dairy = 5 carb choices    2 starch + 2 dairy + 1 fruit = 5 carb choices    3 starch + 2 dairy = 5 carb choices    2 starch + 1 dairy + 2 fruit = 5 carb choices    3 starch + 2 fruit = 5 carb choices    5 starches = 5 carb choices  Veggies and meats do not count- add them as needed.

## 2014-05-21 ENCOUNTER — Other Ambulatory Visit: Payer: Self-pay | Admitting: Internal Medicine

## 2014-05-21 ENCOUNTER — Telehealth: Payer: Self-pay | Admitting: Dietician

## 2014-05-21 MED ORDER — INSULIN GLARGINE 100 UNIT/ML SOLOSTAR PEN: 24.0000 [IU] | PEN_INJECTOR | Freq: Every day | SUBCUTANEOUS | Status: DC

## 2014-05-21 NOTE — Progress Notes (Signed)
I reviewed the Telephone note by Diabetes Educator Debera Lat and discussed patient with her.  I agree with her recommendation to increase Lantus insulin to 24 units daily, and I changed the Lantus dose to reflect this.

## 2014-05-21 NOTE — Telephone Encounter (Signed)
Patient called back saying his blood sugars are better now in the 300s most of the day, took 10 units Novolog yesterday twice (he only eats 2 meals a day) and this am with breakfast and blood sugar of 375 fasting. He says he is taking 20 units of lantus at night and says he feels about the same, drowsy when not moving. Asked him to call us Friday with three days of blood sugars and Novolog doses.  Recommend increasing his lantus to 24 units for the next few days to a  week using new increased  TDD of 48-50 units/day. Then assuming he is still not at goal blood sugars at the end of that week, adjusting his mealtime and correction scale based on new TDD calculations.suggest Increase mealtime to 7 units and correction to 2 unit increments for each 50 mg/dl  starting at 150 mg/dl as in the example below:  To know how much Novolog Mealtime insulin to take; Check blood sugar before each meal. If blood sugar is:       < 150 take 7 units novolog before the meal 151- 200 inject 8 units Novolog 201- 250 inject 10 units Novolog 251- 300 inject 11 units  Novolog 301 - 350 inject 13 units Novolog 351 - 400 inject 14 units Novolog 401 - 450 inject 16 units Novolog 451 - 500 inject 17 units Novolog 501 - 550 inject 19  units Novolog, check blood sugar every 2 hours; if blood sugar does not decrease below 500 then call doctor's office.  551 - 600 inject 20 units Novolog , check blood sugar every 2 hours; if blood sugar does not decrease below 500 then call doctor's office.        > 600 inject 22 units and call office, if office is closed call after hours resident for instructions.

## 2014-05-21 NOTE — Telephone Encounter (Signed)
Patient not available but wife says he is better and his sugar is down. Left message for him to call when he gets in.

## 2014-05-21 NOTE — Telephone Encounter (Signed)
Per Dr. Marinda Elk order patient informed to increase his lantus dose to 24 units starting today. He  verbalized understanding using teach back. Reiterated calling in blood sugars and Novolog doses this Friday

## 2014-06-02 ENCOUNTER — Encounter: Payer: Self-pay | Admitting: Internal Medicine

## 2014-06-02 ENCOUNTER — Ambulatory Visit (INDEPENDENT_AMBULATORY_CARE_PROVIDER_SITE_OTHER): Payer: PRIVATE HEALTH INSURANCE | Admitting: Internal Medicine

## 2014-06-02 VITALS — BP 155/65 | HR 61 | Temp 98.0°F | Ht 72.0 in | Wt 217.0 lb

## 2014-06-02 DIAGNOSIS — I10 Essential (primary) hypertension: Secondary | ICD-10-CM

## 2014-06-02 DIAGNOSIS — I129 Hypertensive chronic kidney disease with stage 1 through stage 4 chronic kidney disease, or unspecified chronic kidney disease: Secondary | ICD-10-CM

## 2014-06-02 DIAGNOSIS — E1122 Type 2 diabetes mellitus with diabetic chronic kidney disease: Secondary | ICD-10-CM

## 2014-06-02 DIAGNOSIS — B370 Candidal stomatitis: Secondary | ICD-10-CM

## 2014-06-02 DIAGNOSIS — E1165 Type 2 diabetes mellitus with hyperglycemia: Principal | ICD-10-CM

## 2014-06-02 DIAGNOSIS — N183 Chronic kidney disease, stage 3 unspecified: Secondary | ICD-10-CM

## 2014-06-02 DIAGNOSIS — IMO0001 Reserved for inherently not codable concepts without codable children: Secondary | ICD-10-CM

## 2014-06-02 LAB — GLUCOSE, CAPILLARY: Glucose-Capillary: 276 mg/dL — ABNORMAL HIGH (ref 70–99)

## 2014-06-02 MED ORDER — NYSTATIN 100000 UNIT/ML MT SUSP: 5.0000 mL | Freq: Four times a day (QID) | OROMUCOSAL | Status: DC

## 2014-06-02 MED ORDER — INSULIN GLARGINE 100 UNIT/ML SOLOSTAR PEN: 30.0000 [IU] | PEN_INJECTOR | Freq: Every day | SUBCUTANEOUS | Status: DC

## 2014-06-02 MED ORDER — INSULIN ASPART 100 UNIT/ML ~~LOC~~ SOLN: SUBCUTANEOUS | Status: DC

## 2014-06-02 NOTE — Progress Notes (Signed)
Subjective:   Patient ID: Charles Hart male   DOB: September 03, 1955 58 y.o.   MRN: [id number]  HPI: [name] is a 58 y.o. male with poorly controlled DM2 and other PMH as listed below presenting to opc today for diabetes follow up visit.   DM2--remains uncontrolled. CBGs consistently 300-400s, no hypoglycemia.  Denies polyuria at baseline but urinates frequently when he drinks a lot of water. Does get thirsty often and has noted a change in taste with white coating on tongue. Denies N/V/D, abdominal pain, headache, chest pain, or change in vision.  Has been taking Lantus 25 units daily and novolog sliding scale per Dr. Roda Shutters visit earlier this month.  Reports eating twice a day a usually but has not eaten anything today and CBG 337 and 381 so far today. He is interested in starting a smoothie cleanse with fruits and one meal a day mainly in the evenings if he can sustain.   HTN--BP initially elevated to 180/79 but improved to 155/65  Past Medical History  Diagnosis Date  . Dermatitis   . CHF (congestive heart failure)     LV function improved from 2004 to 2008.  Historically, moderately dilated LV with EF 30-40% by 2D echo 08/14/2002.  Mild CAD with severe LV dysfunction by cardiac cath 09/2002.  Normal coronary arteries and normal LV function by cardiac cath 09/19/2006.  A 2-D echo on 04/01/2009 showed mild concentric hypertrophy and normal systolic (LVEF  123456) and doppler C/W with grade 1 diastolic dysfunction.  . Hypertension   . Diabetes mellitus   . Hyperlipidemia   . Hearing loss in right ear   . Cardiomyopathy     LV function improved from 2004 to 2008.  Historically, moderately dilated LV with EF 30-40% by 2D echo 08/14/2002.  Mild CAD with severe LV dysfunction by cardiac cath 09/2002.  Normal coronary arteries and normal LV function by cardiac cath 09/19/2006.  A 2-D echo on 04/01/2009 showed mild concentric hypertrophy and normal systolic (LVEF  123456) and doppler C/W  with grade 1 diastolic dysfunction.  . DM neuropathy, painful   . CVA (cerebral vascular accident) 07/04/2012    MRI of the brain 07/04/2012 showed an acute infarct in the right basal ganglia involving the anterior putamen, anterior limb internal capsule, and head of the caudate; this measured approximately 2.5 cm in diameter.     . Hypertensive crisis 07/28/2012   Current Outpatient Prescriptions  Medication Sig Dispense Refill  . cloNIDine (CATAPRES) 0.3 MG tablet Take 0.3 mg by mouth 2 (two) times daily.      . clopidogrel (PLAVIX) 75 MG tablet Take 75 mg by mouth daily.      . digoxin (LANOXIN) 0.25 MG tablet Take 0.25 mg by mouth daily.      . fluticasone (FLONASE) 50 MCG/ACT nasal spray Place 2 sprays into both nostrils daily as needed for allergies or rhinitis.      [name] gabapentin (NEURONTIN) 300 MG capsule Take 300 mg by mouth every morning.      . hydrALAZINE (APRESOLINE) 50 MG tablet Take 100 mg by mouth 3 (three) times daily.      . hydrochlorothiazide (HYDRODIURIL) 25 MG tablet Take 25 mg by mouth daily.      . insulin aspart (NOVOLOG) 100 UNIT/ML injection < 150 take 7 units novolog before the meal 151- 200 inject 8 units Novolog 201- 250 inject 10 units Novolog 251- 300 inject 11 units  Novolog 301 - 350 inject 13  units Novolog 351 - 400 inject 14 units Novolog 401 - 450 inject 16 units Novolog 451 - 500 inject 17 units Novolog 501 - 550 inject 19  units Novolog, check blood sugar every 2 hours; if blood sugar does not decrease below 500 then call doctor's office.  10 mL  0  . Insulin Glargine (LANTUS) 100 UNIT/ML Solostar Pen Inject 30 Units into the skin at bedtime.  15 mL    . labetalol (NORMODYNE) 300 MG tablet Take 300 mg by mouth 2 (two) times daily.      [name] loratadine-pseudoephedrine (CLARITIN-D 24-HOUR) 10-240 MG per 24 hr tablet Take 1 tablet by mouth daily as needed for allergies.      [name] losartan (COZAAR) 50 MG tablet Take 100 mg by mouth daily.      . Multiple  Vitamin (MULITIVITAMIN WITH MINERALS) TABS Take 1 tablet by mouth every morning.       . pravastatin (PRAVACHOL) 40 MG tablet Take 40 mg by mouth daily.       No current facility-administered medications for this visit.   Family History  Problem Relation Age of Onset  . Colon cancer Sister   . Aneurysm Father 31    died of rupture   History   Social History  . Marital Status: Married    Spouse Name: N/A    Number of Children: N/A  . Years of Education: N/A   Social History Main Topics  . Smoking status: Never Smoker   . Smokeless tobacco: Never Used  . Alcohol Use: Yes     Comment: Wine occasionally (no more than 2 glasses per month)  . Drug Use: No  . Sexual Activity: None   Other Topics Concern  . None   Social History Narrative  . None   Review of Systems:  Constitutional:  Thirsty  HEENT:  Change in taste and white coating on tongue. Denies change in vision  Respiratory:  Denies SOB  Cardiovascular:  Denies chest pain  Gastrointestinal:  Denies nausea, vomiting, abdominal pain  Genitourinary:  Denies dysuria  Neurological:  Denies headaches.    Objective:  Physical Exam: Filed Vitals:   06/02/14 1543  BP: 180/79  Pulse: 61  Temp: 98 F (36.7 C)  TempSrc: Oral  Height: 6' (1.829 m)  Weight: 217 lb (98.431 kg)  SpO2: 100%   Vitals reviewed. General: sitting in chair, NAD HEENT: EOMI, thrush Cardiac: RRR Pulm: clear to auscultation bilaterally Abd: soft, nontender, BS present Ext: moving all 4 extremities Neuro: alert and oriented X3  Assessment & Plan:  Discussed with Dr. Eppie Gibson DM2: increased lantus to 30 units and adjusted SSI Nystatin swish and swallow

## 2014-06-02 NOTE — Patient Instructions (Signed)
General Instructions:  Please bring your medicines with you each time you come to clinic.  Medicines may include prescription medications, over-the-counter medications, herbal remedies, eye drops, vitamins, or other pills.  Your blood sugars are still very high but thank you for brining in your meter today.   Lets increase your lantus to 30 units daily and then increase your novolog sliding scale with meals to: < 150 take 7 units novolog before the meal  151- 200 inject 8 units Novolog  201- 250 inject 10 units Novolog  251- 300 inject 11 units Novolog  301 - 350 inject 13 units Novolog  351 - 400 inject 14 units Novolog  401 - 450 inject 16 units Novolog  451 - 500 inject 17 units Novolog  >501 - 550 inject 19 units Novolog, check blood sugar every 2 hours; if blood sugar does not decrease below 500 then call doctor's office.   Try the nystatin swish and swallow as prescribed for your mouth  Progress Toward Treatment Goals:  Treatment Goal 06/02/2014  Hemoglobin A1C -  Blood pressure improved    Self Care Goals & Plans:  Self Care Goal 06/02/2014  Manage my medications take my medicines as prescribed; bring my medications to every visit; refill my medications on time  Monitor my health keep track of my blood glucose; bring my glucose meter and log to each visit; keep track of my blood pressure  Eat healthy foods eat more vegetables; eat foods that are low in salt; eat baked foods instead of fried foods  Be physically active -  Meeting treatment goals -    Home Blood Glucose Monitoring 06/02/2014  Check my blood sugar 3 times a day  When to check my blood sugar -    Care Management & Community Referrals:  Referral 05/02/2014  Referrals made for care management support none needed  Referrals made to community resources -    Nystatin oral suspension What is this medicine? NYSTATIN (nye STAT in) is an antifungal medicine. It is used to treat certain kinds of fungal or  yeast infections. This medicine may be used for other purposes; ask your health care provider or pharmacist if you have questions. COMMON BRAND NAME(S): Mycostatin, Nystex What should I tell my health care provider before I take this medicine? They need to know if you have any of these conditions: -diabetes -kidney disease -an unusual or allergic reaction to nystatin, ethylenediamine, parabens, thimerosal, other foods, dyes or preservatives -pregnant or trying to get pregnant -breast-feeding How should I use this medicine? Follow the directions on the prescription label. Shake well before using. Use a specially marked dropper to measure every dose. Ask your pharmacist if you do not have one. Put one half of the dose in each side of your mouth. Swish the medicine around in your mouth and gargle. Hold your dose in your mouth for as long as you can. Swallow or spit out as directed by your doctor. Take your medicine at regular intervals. Do not take your medicine more often than directed. Do not skip doses or stop your medicine early even if you feel better. Do not stop taking except on your doctor's advice. Talk to your pediatrician regarding the use of this medicine in children. Special care may be needed. Overdosage: If you think you have taken too much of this medicine contact a poison control center or emergency room at once. NOTE: This medicine is only for you. Do not share this medicine with others. What  if I miss a dose? If you miss a dose, take it as soon as you can. If it is almost time for your next dose, take only that dose. Do not take double or extra doses. What may interact with this medicine? Interactions are not expected. This list may not describe all possible interactions. Give your health care provider a list of all the medicines, herbs, non-prescription drugs, or dietary supplements you use. Also tell them if you smoke, drink alcohol, or use illegal drugs. Some items may interact  with your medicine. What should I watch for while using this medicine? Tell your doctor or health care professional if your symptoms do not improve or get worse. If you wear dentures talk to your doctor about how to clean them. What side effects may I notice from receiving this medicine? Side effects that you should report to your doctor or health care professional as soon as possible: -allergic reactions like skin rash, itching or hives, swelling of the face, lips, or tongue -fast heart beat -redness, blistering, peeling or loosening of the skin, including inside the mouth -trouble breathing Side effects that usually do not require medical attention (report to your doctor or health care professional if they continue or are bothersome): -diarrhea -muscle aches or pains -nausea, vomiting -stomach upset This list may not describe all possible side effects. Call your doctor for medical advice about side effects. You may report side effects to FDA at 1-800-FDA-1088. Where should I keep my medicine? Keep out of the reach of children. Store at room temperature between 15 and 25 degrees C (59 and 77 degrees F). Protect from light. Throw away any unused medicine after the expiration date. NOTE: This sheet is a summary. It may not cover all possible information. If you have questions about this medicine, talk to your doctor, pharmacist, or health care provider.  2015, [employer]vier/Gold Standard. (2008-09-02 13:21:00) .

## 2014-06-03 DIAGNOSIS — N184 Chronic kidney disease, stage 4 (severe): Secondary | ICD-10-CM | POA: Insufficient documentation

## 2014-06-03 DIAGNOSIS — B370 Candidal stomatitis: Secondary | ICD-10-CM | POA: Insufficient documentation

## 2014-06-03 DIAGNOSIS — E1122 Type 2 diabetes mellitus with diabetic chronic kidney disease: Secondary | ICD-10-CM | POA: Insufficient documentation

## 2014-06-03 LAB — BASIC METABOLIC PANEL WITH GFR
BUN: 35 mg/dL — AB (ref 6–23)
CALCIUM: 8.9 mg/dL (ref 8.4–10.5)
CO2: 30 mEq/L (ref 19–32)
Chloride: 98 mEq/L (ref 96–112)
Creat: 2.11 mg/dL — ABNORMAL HIGH (ref 0.50–1.35)
GFR, EST AFRICAN AMERICAN: 39 mL/min — AB
GFR, Est Non African American: 34 mL/min — ABNORMAL LOW
GLUCOSE: 280 mg/dL — AB (ref 70–99)
Potassium: 4.2 mEq/L (ref 3.5–5.3)
SODIUM: 136 meq/L (ref 135–145)

## 2014-06-03 NOTE — Assessment & Plan Note (Addendum)
Lab Results  Component Value Date   HGBA1C 11.0 04/25/2014   HGBA1C 9.3 01/24/2014   HGBA1C 6.0 10/16/2013    Assessment: Diabetes control: poor control (HgbA1C >9%) Progress toward A1C goal:    Comments: brought meter today, CBGs remain in 300-400s.   Plan: Medications:  increased lantus to 30 units and adjusted novolog sliding scale: Check blood sugar before each meal. If blood sugar is:   Check blood sugar before each meal. If blood sugar is:  < 150 take 7 units novolog before the meal  151- 200 inject 8 units Novolog  201- 250 inject 10 units Novolog  251- 300 inject 11 units Novolog  301 - 350 inject 13 units Novolog  351 - 400 inject 14 units Novolog  401 - 450 inject 16 units Novolog  451 - 500 inject 17 units Novolog  501 - 550 inject 19 units Novolog, check blood sugar every 2 hours; if blood sugar does not decrease below 500 then call doctor's office.   Home glucose monitoring: Frequency: 3 times a day Timing:   Instruction/counseling given: reminded to get eye exam, reminded to bring blood glucose meter & log to each visit, reminded to bring medications to each visit and discussed diet Advised to call if blood sugar starts running low with new regimen.  He is interested in a smoothie diet but advised to check blood sugars often if he decides to do that to make sure not too high with all the fruit and sugar content or too low if he is skipping meals. Will also try to have CDE follow up with him and blood sugars. opc follow up with pcp 2 weeks

## 2014-06-03 NOTE — Assessment & Plan Note (Signed)
In setting of poorly controlled DM2  -nystatin swish and swallow

## 2014-06-03 NOTE — Assessment & Plan Note (Addendum)
BP Readings from Last 3 Encounters:  06/03/14 155/65  05/18/14 209/88  05/16/14 156/73   Lab Results  Component Value Date   NA 136 06/02/2014   K 4.2 06/02/2014   CREATININE 2.11* 06/02/2014    Assessment: Blood pressure control: severely elevated Progress toward BP goal:  improved Comments: initially 180/79 but improved to 155/65 on recheck  Plan: Medications:  continue current medications hydralazine 100mg  tid, clonidine 0.3mg  bid, digoxin 0.25mg  qd, hctz 25mg  qd, labetalol 300mg  bid, and losartan 100mg  qd Educational resources provided: handout

## 2014-06-03 NOTE — Assessment & Plan Note (Signed)
AKI on prior labs with Cr 2.77.  Repeated bmet with improvement in Cr to 2.11 which is around his baseline of ~1.9 recently but may be new baseline given uncontrolled DM2. BP still elevated but better controlled on multiple agents.   May benefit from renal consultation in near future to establish with them as well.

## 2014-06-04 NOTE — Progress Notes (Signed)
Case discussed with Dr. Eula Fried soon after the resident saw the patient. We reviewed the resident's history and exam and pertinent patient test results. If the blood pressure remains elevated at the return visit we will escalate pharmacotherapy.

## 2014-07-03 ENCOUNTER — Telehealth: Payer: Self-pay | Admitting: Dietician

## 2014-07-03 DIAGNOSIS — E119 Type 2 diabetes mellitus without complications: Secondary | ICD-10-CM

## 2014-07-03 DIAGNOSIS — E1122 Type 2 diabetes mellitus with diabetic chronic kidney disease: Secondary | ICD-10-CM

## 2014-07-03 DIAGNOSIS — N183 Chronic kidney disease, stage 3 unspecified: Secondary | ICD-10-CM

## 2014-07-03 NOTE — Telephone Encounter (Signed)
Patient called asking for his Novolog scale ( he lost it) and if he was supposed to take Novolog  when  His blood sugar is less than 150 mg/dl. Confirmed for him that he should be taking the Novolog meal dose of 7 units when his blood sugar is less than 150 and agreed to mail him a copy of his meal + correction scale.  He also reports he ran out of lantus and the pharmacy told him it was too early to refill it.  His actual current dose of lantus  reported is 25 units.  He reports most of his blood sugars are in 100s despite being out of lantus since Sunday. When I asked him how he got his blood sugar under better control he responded "his diet".   CDE told patient I would request updated order of lantus be sent to his pharmacy and he could come by and/or get a sample if okayed by physician. His next appointment is 08/20/14 with Dr. Marinda Elk.  Note patient reports being on pens for both lantus and Novolog and Novolog appears to be ordered as vials.

## 2014-07-04 MED ORDER — INSULIN GLARGINE 100 UNIT/ML SOLOSTAR PEN: 25.0000 [IU] | PEN_INJECTOR | Freq: Every day | SUBCUTANEOUS | Status: DC

## 2014-07-04 MED ORDER — ACCU-CHEK FASTCLIX LANCETS MISC: Status: DC

## 2014-08-04 ENCOUNTER — Encounter: Payer: Self-pay | Admitting: Internal Medicine

## 2014-08-04 ENCOUNTER — Telehealth: Payer: Self-pay | Admitting: Dietician

## 2014-08-04 ENCOUNTER — Ambulatory Visit (INDEPENDENT_AMBULATORY_CARE_PROVIDER_SITE_OTHER): Payer: PRIVATE HEALTH INSURANCE | Admitting: Internal Medicine

## 2014-08-04 VITALS — BP 186/81 | HR 79 | Temp 97.9°F | Ht 72.0 in | Wt 215.1 lb

## 2014-08-04 DIAGNOSIS — I10 Essential (primary) hypertension: Secondary | ICD-10-CM

## 2014-08-04 DIAGNOSIS — J069 Acute upper respiratory infection, unspecified: Secondary | ICD-10-CM

## 2014-08-04 MED ORDER — GUAIFENESIN-CODEINE 100-10 MG/5ML PO SYRP
5.0000 mL | ORAL_SOLUTION | Freq: Three times a day (TID) | ORAL | Status: DC | PRN
Start: 2014-08-04 — End: 2014-11-26

## 2014-08-04 MED ORDER — SALINE SPRAY 0.65 % NA SOLN: 1.0000 | NASAL | Status: DC | PRN

## 2014-08-04 NOTE — Patient Instructions (Signed)
General Instructions:  Please bring your medicines with you each time you come to clinic.  Medicines may include prescription medications, over-the-counter medications, herbal remedies, eye drops, vitamins, or other pills.    

## 2014-08-04 NOTE — Progress Notes (Signed)
Patient ID: Charles Hart, male   DOB: June 27, 1956, 58 y.o.   MRN: [name]   Subjective:   Patient ID: Charles Hart male   DOB: [birth year] 58 y.o.   MRN: [name]  HPI: Charles Hart is a 58 y.o. with PMH listed below. Presente dtoday with c/o nose congestion, and cough.- 4 days. Cough is productive of brownish sputum. Pt has been using clartin D for nasal congestion, and nasal spray. Pt says he saw blood when he blew his nose. Pt saw it for the first time last night, he had 4 epsiodes of bright red blood from his nose,present only when he blew his nose forcefully, Pt is presently on plavix. He did not see any blood today. No bleeding from rectum, dark stools or any bleeding from other orifice. Pt has run out of his flonase so he has not been using it. No SOB no fever, no sorethroat. He had some relief with the claritin d initially but not any more, but he is still using it, the nasal saline spray helps.  Pt never smoked cigarretes. Does not want to address other issues today, says he will come for follow up appointment with his PCP.  Past Medical History  Diagnosis Date  . Dermatitis   . CHF (congestive heart failure)     LV function improved from 2004 to 2008.  Historically, moderately dilated LV with EF 30-40% by 2D echo 08/14/2002.  Mild CAD with severe LV dysfunction by cardiac cath 09/2002.  Normal coronary arteries and normal LV function by cardiac cath 09/19/2006.  A 2-D echo on 04/01/2009 showed mild concentric hypertrophy and normal systolic (LVEF  123456) and doppler C/W with grade 1 diastolic dysfunction.  . Hypertension   . Diabetes mellitus   . Hyperlipidemia   . Hearing loss in right ear   . Cardiomyopathy     LV function improved from 2004 to 2008.  Historically, moderately dilated LV with EF 30-40% by 2D echo 08/14/2002.  Mild CAD with severe LV dysfunction by cardiac cath 09/2002.  Normal coronary arteries and normal LV function by cardiac cath 09/19/2006.  A 2-D  echo on 04/01/2009 showed mild concentric hypertrophy and normal systolic (LVEF  123456) and doppler C/W with grade 1 diastolic dysfunction.  . DM neuropathy, painful   . CVA (cerebral vascular accident) 07/04/2012    MRI of the brain 07/04/2012 showed an acute infarct in the right basal ganglia involving the anterior putamen, anterior limb internal capsule, and head of the caudate; this measured approximately 2.5 cm in diameter.     . Hypertensive crisis 07/28/2012   Current Outpatient Prescriptions  Medication Sig Dispense Refill  . ACCU-CHEK FASTCLIX LANCETS MISC Check blood sugar as directed up to 3 times a day.  Dx code E11.65 102 each 5  . cloNIDine (CATAPRES) 0.3 MG tablet Take 0.3 mg by mouth 2 (two) times daily.    . clopidogrel (PLAVIX) 75 MG tablet Take 75 mg by mouth daily.    . digoxin (LANOXIN) 0.25 MG tablet Take 0.25 mg by mouth daily.    . fluticasone (FLONASE) 50 MCG/ACT nasal spray Place 2 sprays into both nostrils daily as needed for allergies or rhinitis.    [name] gabapentin (NEURONTIN) 300 MG capsule Take 300 mg by mouth every morning.    . hydrALAZINE (APRESOLINE) 50 MG tablet Take 100 mg by mouth 3 (three) times daily.    . hydrochlorothiazide (HYDRODIURIL) 25 MG tablet Take 25 mg by mouth daily.    [name]  insulin aspart (NOVOLOG) 100 UNIT/ML injection < 150 take 7 units novolog before the meal 151- 200 inject 8 units Novolog 201- 250 inject 10 units Novolog 251- 300 inject 11 units  Novolog 301 - 350 inject 13 units Novolog 351 - 400 inject 14 units Novolog 401 - 450 inject 16 units Novolog 451 - 500 inject 17 units Novolog 501 - 550 inject 19  units Novolog, check blood sugar every 2 hours; if blood sugar does not decrease below 500 then call doctor's office. 10 mL 0  . Insulin Glargine (LANTUS) 100 UNIT/ML Solostar Pen Inject 25 Units into the skin at bedtime. 15 mL 1  . labetalol (NORMODYNE) 300 MG tablet Take 300 mg by mouth 2 (two) times daily.    [name]  loratadine-pseudoephedrine (CLARITIN-D 24-HOUR) 10-240 MG per 24 hr tablet Take 1 tablet by mouth daily as needed for allergies.    [name] losartan (COZAAR) 50 MG tablet Take 100 mg by mouth daily.    . Multiple Vitamin (MULITIVITAMIN WITH MINERALS) TABS Take 1 tablet by mouth every morning.     . nystatin (MYCOSTATIN) 100000 UNIT/ML suspension Take 5 mLs (500,000 Units total) by mouth 4 (four) times daily. 60 mL 0  . pravastatin (PRAVACHOL) 40 MG tablet Take 40 mg by mouth daily.     No current facility-administered medications for this visit.   Family History  Problem Relation Age of Onset  . Colon cancer Sister   . Aneurysm Father 32    died of rupture   History   Social History  . Marital Status: Married    Spouse Name: N/A    Number of Children: N/A  . Years of Education: N/A   Social History Main Topics  . Smoking status: Never Smoker   . Smokeless tobacco: Never Used  . Alcohol Use: Yes     Comment: Wine occasionally (no more than 2 glasses per month)  . Drug Use: No  . Sexual Activity: None   Other Topics Concern  . None   Social History Narrative   Review of Systems: CONSTITUTIONAL- No Fever, weightloss, night sweat or change in appetite. SKIN- No Rash, colour changes or itching. HEAD- No Headache or dizziness. Mouth/throat- No Sorethroat,. RESPIRATORY- No Cough or SOB. CARDIAC- No Palpitations, or chest pain. GI- No nausea, vomiting, diarrhoea, constipation, abd pain. URINARY- No Frequency, urgency, straining or dysuria. NEUROLOGIC- No Numbness, syncope, seizures or burning.  Objective:  Physical Exam: Filed Vitals:   08/04/14 1607  BP: 186/81  Pulse: 79  Temp: 97.9 F (36.6 C)  TempSrc: Oral  Height: 6' (1.829 m)  Weight: 215 lb 1.6 oz (97.569 kg)  SpO2: 100%   GENERAL- alert, co-operative, appears as stated age, not in any distress. HEENT- Atraumatic, normocephalic, PERRL, EOMI, oral mucosa appears moist, no obvious source of bleeding for nose,  septum intact, neck supple. CARDIAC- RRR, no murmurs, rubs or gallops. RESP- Moving equal volumes of air, and clear to auscultation bilaterally, no wheezes or crackles. ABDOMEN- Soft, nontender, no guarding or rebound, no palpable masses or organomegaly, bowel sounds present. BACK- Normal curvature of the spine, No tenderness along the vertebrae, no CVA tenderness. NEURO- No obvious Cr N abnormality, strenght upper and lower extremities- 5/5, Gait- Normal. EXTREMITIES- warm and well perfused, no pedal edema. SKIN- Warm, dry, No rash or lesion. PSYCH- Normal mood and affect, appropriate thought content and speech.  Assessment & Plan:   The patient's case and plan of care was discussed with attending physician, Dr. Ellwood Dense.  Please see problem based charting for assessment and plan.

## 2014-08-04 NOTE — Telephone Encounter (Signed)
Asking  For a refill Virtussin AC syrup. Told him I would give message to triage nurse and she could call him if there are any questions.

## 2014-08-04 NOTE — Progress Notes (Signed)
Case discussed with Dr. Emokpae soon after the resident saw the patient. We reviewed the resident's history and exam and pertinent patient test results. I agree with the assessment, diagnosis, and plan of care documented in the resident's note. 

## 2014-08-05 DIAGNOSIS — J069 Acute upper respiratory infection, unspecified: Secondary | ICD-10-CM | POA: Insufficient documentation

## 2014-08-05 NOTE — Assessment & Plan Note (Addendum)
Cough and nasal congestion of 4 days duration. With 4 nose bleeds- 2 days prior, only with forceful blowing of his nose. No episodes since then. On plavix. Has been using Claritin D consistently with initial response. Ran out of flonase. Virtussin (Guaifensin- codeine) has helped in the past- pt is asking for this has he has run out.   Plan- Virtussin- Guaifenasin-codeine- 100-10 (86mls)- TID, no refills. - Pt to stop using Clartin D. - Can try over the counter benadryl at night, might help. - Continue Plavix for now, as bleeding has stopped, counselled to not blow his nose for now, if he has to, do it gently. - No flonase for now, might worsen nose bleeds.

## 2014-08-05 NOTE — Assessment & Plan Note (Addendum)
BP Readings from Last 3 Encounters:  08/04/14 186/81  06/03/14 155/65  05/18/14 209/88    Lab Results  Component Value Date   NA 136 06/02/2014   K 4.2 06/02/2014   CREATININE 2.11* 06/02/2014    Assessment: Blood pressure control:  Uncontrolled Progress toward BP goal:   not at goal Comments: Patient says he is compliant with Bp meds- Labetalol- 300mg  BID, Hydralazine- 100mg  TID, HCTZ- 25mg  daily, lorsatan- 100mg  daily, clonidine- 0.3mg  daily. Pt follow with nephrology for CKD3. Renal US- 2014 with enlarged but equal sized kidneys. Patient has been consistently using Claritin which contains Psuedoephedrin and can cause elevated BP.  Plan: Medications:  continue current medications Educational resources provided: brochure, handout, video Self management tools provided:   Other plans: - Pt strongly counselled to not use Claritin D anymore. - Pt to bring log next visit. - Pt to bring meds next visit, and describe how he takes them.   - A renin-aldosterone level might be helpful, i do not see one in chart.

## 2014-08-20 ENCOUNTER — Inpatient Hospital Stay (HOSPITAL_COMMUNITY): Payer: Medicare Other

## 2014-08-20 ENCOUNTER — Ambulatory Visit (INDEPENDENT_AMBULATORY_CARE_PROVIDER_SITE_OTHER): Payer: Medicare Other | Admitting: Internal Medicine

## 2014-08-20 ENCOUNTER — Ambulatory Visit (HOSPITAL_COMMUNITY)
Admission: RE | Admit: 2014-08-20 | Discharge: 2014-08-20 | Disposition: A | Discharge status: Home / Self Care | Payer: Medicare Other | Source: Ambulatory Visit | Attending: Internal Medicine | Admitting: Internal Medicine

## 2014-08-20 ENCOUNTER — Encounter: Payer: Self-pay | Admitting: Internal Medicine

## 2014-08-20 ENCOUNTER — Encounter (HOSPITAL_COMMUNITY): Payer: Self-pay | Admitting: General Practice

## 2014-08-20 ENCOUNTER — Inpatient Hospital Stay (HOSPITAL_COMMUNITY)
Admission: AD | Admit: 2014-08-20 | Discharge: 2014-08-23 | DRG: 194 | Disposition: A | Discharge status: Home / Self Care | Payer: Medicare Other | Source: Ambulatory Visit | Attending: Oncology | Admitting: Oncology

## 2014-08-20 VITALS — BP 200/100 | HR 100 | Temp 99.5°F | Ht 72.0 in | Wt 213.2 lb

## 2014-08-20 DIAGNOSIS — I509 Heart failure, unspecified: Secondary | ICD-10-CM | POA: Diagnosis not present

## 2014-08-20 DIAGNOSIS — E785 Hyperlipidemia, unspecified: Secondary | ICD-10-CM | POA: Diagnosis present

## 2014-08-20 DIAGNOSIS — R51 Headache: Secondary | ICD-10-CM | POA: Diagnosis not present

## 2014-08-20 DIAGNOSIS — E1165 Type 2 diabetes mellitus with hyperglycemia: Secondary | ICD-10-CM

## 2014-08-20 DIAGNOSIS — E1140 Type 2 diabetes mellitus with diabetic neuropathy, unspecified: Secondary | ICD-10-CM | POA: Diagnosis not present

## 2014-08-20 DIAGNOSIS — J189 Pneumonia, unspecified organism: Secondary | ICD-10-CM | POA: Diagnosis present

## 2014-08-20 DIAGNOSIS — E119 Type 2 diabetes mellitus without complications: Secondary | ICD-10-CM | POA: Diagnosis not present

## 2014-08-20 DIAGNOSIS — Z7902 Long term (current) use of antithrombotics/antiplatelets: Secondary | ICD-10-CM

## 2014-08-20 DIAGNOSIS — Z888 Allergy status to other drugs, medicaments and biological substances status: Secondary | ICD-10-CM | POA: Diagnosis not present

## 2014-08-20 DIAGNOSIS — R109 Unspecified abdominal pain: Secondary | ICD-10-CM

## 2014-08-20 DIAGNOSIS — E876 Hypokalemia: Secondary | ICD-10-CM | POA: Diagnosis present

## 2014-08-20 DIAGNOSIS — E1129 Type 2 diabetes mellitus with other diabetic kidney complication: Secondary | ICD-10-CM | POA: Diagnosis not present

## 2014-08-20 DIAGNOSIS — Z8673 Personal history of transient ischemic attack (TIA), and cerebral infarction without residual deficits: Secondary | ICD-10-CM | POA: Diagnosis not present

## 2014-08-20 DIAGNOSIS — D649 Anemia, unspecified: Secondary | ICD-10-CM | POA: Diagnosis not present

## 2014-08-20 DIAGNOSIS — I10 Essential (primary) hypertension: Secondary | ICD-10-CM | POA: Diagnosis present

## 2014-08-20 DIAGNOSIS — N179 Acute kidney failure, unspecified: Secondary | ICD-10-CM | POA: Diagnosis not present

## 2014-08-20 DIAGNOSIS — I252 Old myocardial infarction: Secondary | ICD-10-CM

## 2014-08-20 DIAGNOSIS — R05 Cough: Secondary | ICD-10-CM

## 2014-08-20 DIAGNOSIS — E1122 Type 2 diabetes mellitus with diabetic chronic kidney disease: Secondary | ICD-10-CM | POA: Diagnosis present

## 2014-08-20 DIAGNOSIS — N189 Chronic kidney disease, unspecified: Secondary | ICD-10-CM | POA: Diagnosis not present

## 2014-08-20 DIAGNOSIS — I2510 Atherosclerotic heart disease of native coronary artery without angina pectoris: Secondary | ICD-10-CM | POA: Diagnosis not present

## 2014-08-20 DIAGNOSIS — R059 Cough, unspecified: Secondary | ICD-10-CM | POA: Insufficient documentation

## 2014-08-20 DIAGNOSIS — R918 Other nonspecific abnormal finding of lung field: Secondary | ICD-10-CM | POA: Diagnosis not present

## 2014-08-20 DIAGNOSIS — H9191 Unspecified hearing loss, right ear: Secondary | ICD-10-CM | POA: Diagnosis present

## 2014-08-20 DIAGNOSIS — K5900 Constipation, unspecified: Secondary | ICD-10-CM | POA: Diagnosis present

## 2014-08-20 DIAGNOSIS — N184 Chronic kidney disease, stage 4 (severe): Secondary | ICD-10-CM | POA: Diagnosis present

## 2014-08-20 DIAGNOSIS — R0602 Shortness of breath: Secondary | ICD-10-CM | POA: Diagnosis present

## 2014-08-20 DIAGNOSIS — R519 Headache, unspecified: Secondary | ICD-10-CM

## 2014-08-20 DIAGNOSIS — I160 Hypertensive urgency: Secondary | ICD-10-CM

## 2014-08-20 DIAGNOSIS — IMO0002 Reserved for concepts with insufficient information to code with codable children: Secondary | ICD-10-CM

## 2014-08-20 DIAGNOSIS — I429 Cardiomyopathy, unspecified: Secondary | ICD-10-CM | POA: Diagnosis present

## 2014-08-20 HISTORY — DX: Hypertensive urgency: I16.0

## 2014-08-20 HISTORY — DX: Acute myocardial infarction, unspecified: I21.9

## 2014-08-20 LAB — CBC WITH DIFFERENTIAL/PLATELET
BASOS PCT: 0 % (ref 0–1)
Basophils Absolute: 0 10*3/uL (ref 0.0–0.1)
EOS ABS: 0 10*3/uL (ref 0.0–0.7)
Eosinophils Relative: 0 % (ref 0–5)
HCT: 31 % — ABNORMAL LOW (ref 39.0–52.0)
Hemoglobin: 10.2 g/dL — ABNORMAL LOW (ref 13.0–17.0)
LYMPHS PCT: 9 % — AB (ref 12–46)
Lymphs Abs: 0.9 10*3/uL (ref 0.7–4.0)
MCH: 26.6 pg (ref 26.0–34.0)
MCHC: 32.9 g/dL (ref 30.0–36.0)
MCV: 80.7 fL (ref 78.0–100.0)
MONOS PCT: 10 % (ref 3–12)
Monocytes Absolute: 1 10*3/uL (ref 0.1–1.0)
NEUTROS PCT: 81 % — AB (ref 43–77)
Neutro Abs: 7.5 10*3/uL (ref 1.7–7.7)
PLATELETS: 257 10*3/uL (ref 150–400)
RBC: 3.84 MIL/uL — ABNORMAL LOW (ref 4.22–5.81)
RDW: 12.5 % (ref 11.5–15.5)
WBC: 9.3 10*3/uL (ref 4.0–10.5)

## 2014-08-20 LAB — COMPREHENSIVE METABOLIC PANEL
ALBUMIN: 3.1 g/dL — AB (ref 3.5–5.2)
ALT: 15 U/L (ref 0–53)
AST: 26 U/L (ref 0–37)
Alkaline Phosphatase: 59 U/L (ref 39–117)
Anion gap: 15 (ref 5–15)
BUN: 42 mg/dL — ABNORMAL HIGH (ref 6–23)
CHLORIDE: 97 meq/L (ref 96–112)
CO2: 25 mmol/L (ref 19–32)
CREATININE: 2.53 mg/dL — AB (ref 0.50–1.35)
Calcium: 8.4 mg/dL (ref 8.4–10.5)
GFR calc non Af Amer: 27 mL/min — ABNORMAL LOW (ref >90)
GFR, EST AFRICAN AMERICAN: 31 mL/min — AB (ref >90)
Glucose, Bld: 208 mg/dL — ABNORMAL HIGH (ref 70–99)
POTASSIUM: 3.3 mmol/L — AB (ref 3.5–5.1)
SODIUM: 137 mmol/L (ref 135–145)
Total Bilirubin: 1 mg/dL (ref 0.3–1.2)
Total Protein: 7 g/dL (ref 6.0–8.3)

## 2014-08-20 LAB — URINALYSIS, ROUTINE W REFLEX MICROSCOPIC
BILIRUBIN URINE: NEGATIVE
Bilirubin Urine: NEGATIVE
GLUCOSE, UA: 250 mg/dL — AB
Glucose, UA: 100 mg/dL — AB
KETONES UR: NEGATIVE mg/dL
Ketones, ur: NEGATIVE mg/dL
Leukocytes, UA: NEGATIVE
Leukocytes, UA: NEGATIVE
Nitrite: NEGATIVE
Nitrite: NEGATIVE
Specific Gravity, Urine: 1.017 (ref 1.005–1.030)
Specific Gravity, Urine: 1.018 (ref 1.005–1.030)
UROBILINOGEN UA: 2 mg/dL — AB (ref 0.0–1.0)
UROBILINOGEN UA: 2 mg/dL — AB (ref 0.0–1.0)
pH: 6 (ref 5.0–8.0)
pH: 6 (ref 5.0–8.0)

## 2014-08-20 LAB — LIPID PANEL
CHOLESTEROL: 189 mg/dL (ref 0–200)
HDL: 37 mg/dL — ABNORMAL LOW (ref >39)
LDL Cholesterol: 115 mg/dL — ABNORMAL HIGH (ref 0–99)
TRIGLYCERIDES: 183 mg/dL — AB (ref <150)
Total CHOL/HDL Ratio: 5.1 RATIO
VLDL: 37 mg/dL (ref 0–40)

## 2014-08-20 LAB — LACTIC ACID, PLASMA: Lactic Acid, Venous: 1.2 mmol/L (ref 0.5–2.2)

## 2014-08-20 LAB — URINE MICROSCOPIC-ADD ON

## 2014-08-20 LAB — MRSA PCR SCREENING: MRSA by PCR: NEGATIVE

## 2014-08-20 LAB — TROPONIN I
TROPONIN I: 0.11 ng/mL — AB (ref <0.031)
Troponin I: 0.11 ng/mL — ABNORMAL HIGH (ref <0.031)

## 2014-08-20 LAB — GLUCOSE, CAPILLARY
GLUCOSE-CAPILLARY: 191 mg/dL — AB (ref 70–99)
GLUCOSE-CAPILLARY: 174 mg/dL — AB (ref 70–99)
Glucose-Capillary: 204 mg/dL — ABNORMAL HIGH (ref 70–99)
Glucose-Capillary: 170 mg/dL — ABNORMAL HIGH (ref 70–99)

## 2014-08-20 LAB — LIPASE, BLOOD: LIPASE: 43 U/L (ref 11–59)

## 2014-08-20 MED ORDER — CEFTRIAXONE SODIUM IN DEXTROSE 20 MG/ML IV SOLN
1.0000 g | INTRAVENOUS | Status: DC
  Administered 2014-08-20 – 2014-08-21 (×2): 1 g via INTRAVENOUS
  Filled 2014-08-20 (×3): qty 50

## 2014-08-20 MED ORDER — LABETALOL HCL 5 MG/ML IV SOLN
10.0000 mg | INTRAVENOUS | Status: DC | PRN
  Filled 2014-08-20: qty 4

## 2014-08-20 MED ORDER — LOSARTAN POTASSIUM 50 MG PO TABS: 100.0000 mg | ORAL_TABLET | Freq: Every day | ORAL | Status: DC

## 2014-08-20 MED ORDER — HYDRALAZINE HCL 50 MG PO TABS
100.0000 mg | ORAL_TABLET | Freq: Three times a day (TID) | ORAL | Status: DC
  Administered 2014-08-20 – 2014-08-23 (×9): 100 mg via ORAL
  Filled 2014-08-20 (×12): qty 2

## 2014-08-20 MED ORDER — DIGOXIN 250 MCG PO TABS
0.2500 mg | ORAL_TABLET | Freq: Every day | ORAL | Status: DC
  Administered 2014-08-20 – 2014-08-23 (×4): 0.25 mg via ORAL
  Filled 2014-08-20 (×4): qty 1

## 2014-08-20 MED ORDER — POLYETHYLENE GLYCOL 3350 17 G PO PACK
17.0000 g | PACK | Freq: Every day | ORAL | Status: AC
  Administered 2014-08-20 – 2014-08-21 (×2): 17 g via ORAL
  Filled 2014-08-20 (×2): qty 1

## 2014-08-20 MED ORDER — CLONIDINE HCL 0.3 MG PO TABS
0.3000 mg | ORAL_TABLET | Freq: Two times a day (BID) | ORAL | Status: DC
  Administered 2014-08-20 – 2014-08-23 (×7): 0.3 mg via ORAL
  Filled 2014-08-20 (×6): qty 1
  Filled 2014-08-20: qty 3
  Filled 2014-08-20 (×2): qty 1

## 2014-08-20 MED ORDER — LABETALOL HCL 300 MG PO TABS
300.0000 mg | ORAL_TABLET | Freq: Two times a day (BID) | ORAL | Status: DC
  Administered 2014-08-21 – 2014-08-23 (×5): 300 mg via ORAL
  Filled 2014-08-20 (×7): qty 1

## 2014-08-20 MED ORDER — SENNOSIDES-DOCUSATE SODIUM 8.6-50 MG PO TABS
1.0000 | ORAL_TABLET | Freq: Two times a day (BID) | ORAL | Status: AC
  Administered 2014-08-20 – 2014-08-21 (×3): 1 via ORAL
  Filled 2014-08-20 (×3): qty 1

## 2014-08-20 MED ORDER — GABAPENTIN 300 MG PO CAPS
300.0000 mg | ORAL_CAPSULE | Freq: Every morning | ORAL | Status: DC
  Administered 2014-08-21 – 2014-08-23 (×3): 300 mg via ORAL
  Filled 2014-08-20 (×3): qty 1

## 2014-08-20 MED ORDER — ONDANSETRON HCL 4 MG PO TABS: 4.0000 mg | ORAL_TABLET | Freq: Four times a day (QID) | ORAL | Status: DC | PRN

## 2014-08-20 MED ORDER — LABETALOL HCL 5 MG/ML IV SOLN
INTRAVENOUS | Status: AC
  Administered 2014-08-20: 5 mg
  Filled 2014-08-20: qty 4

## 2014-08-20 MED ORDER — INSULIN ASPART 100 UNIT/ML ~~LOC~~ SOLN
0.0000 [IU] | Freq: Three times a day (TID) | SUBCUTANEOUS | Status: DC
  Administered 2014-08-20 – 2014-08-21 (×3): 3 [IU] via SUBCUTANEOUS
  Administered 2014-08-21: 5 [IU] via SUBCUTANEOUS
  Administered 2014-08-22 (×2): 3 [IU] via SUBCUTANEOUS
  Administered 2014-08-22: 5 [IU] via SUBCUTANEOUS
  Administered 2014-08-23: 3 [IU] via SUBCUTANEOUS
  Administered 2014-08-23: 5 [IU] via SUBCUTANEOUS

## 2014-08-20 MED ORDER — LABETALOL HCL 5 MG/ML IV SOLN
10.0000 mg | Freq: Once | INTRAVENOUS | Status: AC
  Administered 2014-08-20: 10 mg via INTRAVENOUS
  Filled 2014-08-20: qty 4

## 2014-08-20 MED ORDER — HEPARIN SODIUM (PORCINE) 5000 UNIT/ML IJ SOLN
5000.0000 [IU] | Freq: Three times a day (TID) | INTRAMUSCULAR | Status: DC
  Administered 2014-08-20 – 2014-08-23 (×9): 5000 [IU] via SUBCUTANEOUS
  Filled 2014-08-20 (×11): qty 1

## 2014-08-20 MED ORDER — SODIUM CHLORIDE 0.9 % IV SOLN
INTRAVENOUS | Status: AC
  Administered 2014-08-20: 100 mL/h via INTRAVENOUS
  Administered 2014-08-21: 03:00:00 via INTRAVENOUS

## 2014-08-20 MED ORDER — PRAVASTATIN SODIUM 40 MG PO TABS
40.0000 mg | ORAL_TABLET | Freq: Every day | ORAL | Status: DC
  Administered 2014-08-21 – 2014-08-23 (×3): 40 mg via ORAL
  Filled 2014-08-20 (×3): qty 1

## 2014-08-20 MED ORDER — METOPROLOL TARTRATE 1 MG/ML IV SOLN
5.0000 mg | Freq: Once | INTRAVENOUS | Status: AC
  Administered 2014-08-21: 5 mg via INTRAVENOUS
  Filled 2014-08-20: qty 5

## 2014-08-20 MED ORDER — CLOPIDOGREL BISULFATE 75 MG PO TABS
75.0000 mg | ORAL_TABLET | Freq: Every day | ORAL | Status: DC
  Administered 2014-08-20 – 2014-08-23 (×4): 75 mg via ORAL
  Filled 2014-08-20 (×4): qty 1

## 2014-08-20 MED ORDER — SODIUM CHLORIDE 0.9 % IJ SOLN
3.0000 mL | Freq: Two times a day (BID) | INTRAMUSCULAR | Status: DC
  Administered 2014-08-20 – 2014-08-23 (×6): 3 mL via INTRAVENOUS

## 2014-08-20 MED ORDER — HYDROCHLOROTHIAZIDE 25 MG PO TABS: 25.0000 mg | ORAL_TABLET | Freq: Every day | ORAL | Status: DC

## 2014-08-20 MED ORDER — DEXTROSE 5 % IV SOLN
500.0000 mg | INTRAVENOUS | Status: DC
  Administered 2014-08-20 – 2014-08-21 (×2): 500 mg via INTRAVENOUS
  Filled 2014-08-20 (×3): qty 500

## 2014-08-20 MED ORDER — INSULIN ASPART 100 UNIT/ML ~~LOC~~ SOLN
0.0000 [IU] | SUBCUTANEOUS | Status: DC
  Administered 2014-08-20: 3 [IU] via SUBCUTANEOUS

## 2014-08-20 MED ORDER — LABETALOL HCL 5 MG/ML IV SOLN: 10.0000 mg | INTRAVENOUS | Status: DC | PRN

## 2014-08-20 MED ORDER — POTASSIUM CHLORIDE CRYS ER 20 MEQ PO TBCR
40.0000 meq | EXTENDED_RELEASE_TABLET | Freq: Once | ORAL | Status: AC
  Administered 2014-08-20: 40 meq via ORAL
  Filled 2014-08-20: qty 2

## 2014-08-20 MED ORDER — ONDANSETRON HCL 4 MG/2ML IJ SOLN
4.0000 mg | Freq: Four times a day (QID) | INTRAMUSCULAR | Status: DC | PRN
  Administered 2014-08-20: 4 mg via INTRAVENOUS
  Filled 2014-08-20: qty 2

## 2014-08-20 MED ORDER — LABETALOL HCL 5 MG/ML IV SOLN
10.0000 mg | Freq: Once | INTRAVENOUS | Status: AC
  Administered 2014-08-20: 10 mg via INTRAVENOUS

## 2014-08-20 NOTE — Assessment & Plan Note (Signed)
Lab Results  Component Value Date   HGBA1C 11.0 04/25/2014   HGBA1C 9.3 01/24/2014   HGBA1C 6.0 10/16/2013     Assessment: Diabetes has been poorly controlled.  Plan: Labs are pending.

## 2014-08-20 NOTE — Assessment & Plan Note (Signed)
Assessment: Patient reports intermittent headache for the past 2 days, and presents with severe hypertension.  He has chronic poorly controlled hypertension.    Plan: Head CT scan without contrast is appropriate; treat hypertension.

## 2014-08-20 NOTE — Progress Notes (Signed)
ANTIBIOTIC CONSULT NOTE - INITIAL  Pharmacy Consult for Ceftriaxone Indication: rule out pneumonia  Allergies  Allergen Reactions  . Amlodipine Swelling  . Lisinopril Cough    Patient Measurements: Height: 6' (182.9 cm) Weight: 207 lb 14.3 oz (94.3 kg) IBW/kg (Calculated) : 77.6  Vital Signs: Temp: 99.6 F (37.6 C) (01/06 1730) Temp Source: Oral (01/06 1730) BP: 200/98 mmHg (01/06 1744) Pulse Rate: 93 (01/06 1730) Intake/Output from previous day:   Intake/Output from this shift: Total I/O In: 295 [P.O.:240; I.V.:55] Out: 250 [Urine:250]  Labs:  Recent Labs  08/20/14 1229  WBC 9.3  HGB 10.2*  PLT 257  CREATININE 2.53*   Estimated Creatinine Clearance: 38.4 mL/min (by C-G formula based on Cr of 2.53). No results for input(s): VANCOTROUGH, VANCOPEAK, VANCORANDOM, GENTTROUGH, GENTPEAK, GENTRANDOM, TOBRATROUGH, TOBRAPEAK, TOBRARND, AMIKACINPEAK, AMIKACINTROU, AMIKACIN in the last 72 hours.   Microbiology: Recent Results (from the past 720 hour(s))  MRSA PCR Screening     Status: None   Collection Time: 08/20/14  1:19 PM  Result Value Ref Range Status   MRSA by PCR NEGATIVE NEGATIVE Final    Comment:        The GeneXpert MRSA Assay (FDA approved for NASAL specimens only), is one component of a comprehensive MRSA colonization surveillance program. It is not intended to diagnose MRSA infection nor to guide or monitor treatment for MRSA infections.     Medical History: Past Medical History  Diagnosis Date  . Dermatitis   . CHF (congestive heart failure)     LV function improved from 2004 to 2008.  Historically, moderately dilated LV with EF 30-40% by 2D echo 08/14/2002.  Mild CAD with severe LV dysfunction by cardiac cath 09/2002.  Normal coronary arteries and normal LV function by cardiac cath 09/19/2006.  A 2-D echo on 04/01/2009 showed mild concentric hypertrophy and normal systolic (LVEF  123456) and doppler C/W with grade 1 diastolic dysfunction.  .  Hypertension   . Hyperlipidemia   . Hearing loss in right ear   . Cardiomyopathy     LV function improved from 2004 to 2008.  Historically, moderately dilated LV with EF 30-40% by 2D echo 08/14/2002.  Mild CAD with severe LV dysfunction by cardiac cath 09/2002.  Normal coronary arteries and normal LV function by cardiac cath 09/19/2006.  A 2-D echo on 04/01/2009 showed mild concentric hypertrophy and normal systolic (LVEF  123456) and doppler C/W with grade 1 diastolic dysfunction.  . DM neuropathy, painful   . CVA (cerebral vascular accident) 07/04/2012    MRI of the brain 07/04/2012 showed an acute infarct in the right basal ganglia involving the anterior putamen, anterior limb internal capsule, and head of the caudate; this measured approximately 2.5 cm in diameter.     . Hypertensive crisis 07/28/2012  . Myocardial infarction   . Diabetes mellitus     type 2    Medications:  Prescriptions prior to admission  Medication Sig Dispense Refill Last Dose  . ACCU-CHEK FASTCLIX LANCETS MISC Check blood sugar as directed up to 3 times a day.  Dx code E11.65 102 each 5 Taking  . cloNIDine (CATAPRES) 0.3 MG tablet Take 0.3 mg by mouth 2 (two) times daily.   Taking  . clopidogrel (PLAVIX) 75 MG tablet Take 75 mg by mouth daily.   Taking  . digoxin (LANOXIN) 0.25 MG tablet Take 0.25 mg by mouth daily.   Taking  . fluticasone (FLONASE) 50 MCG/ACT nasal spray Place 2 sprays into both nostrils daily as  needed for allergies or rhinitis.   Taking  . gabapentin (NEURONTIN) 300 MG capsule Take 300 mg by mouth every morning.   Taking  . guaiFENesin-codeine (ROBITUSSIN AC) 100-10 MG/5ML syrup Take 5 mLs by mouth 3 (three) times daily as needed for cough. 120 mL 0 Taking  . hydrALAZINE (APRESOLINE) 50 MG tablet Take 100 mg by mouth 3 (three) times daily.   Taking  . hydrochlorothiazide (HYDRODIURIL) 25 MG tablet Take 25 mg by mouth daily.   Taking  . insulin aspart (NOVOLOG) 100 UNIT/ML injection < 150 take 7  units novolog before the meal 151- 200 inject 8 units Novolog 201- 250 inject 10 units Novolog 251- 300 inject 11 units  Novolog 301 - 350 inject 13 units Novolog 351 - 400 inject 14 units Novolog 401 - 450 inject 16 units Novolog 451 - 500 inject 17 units Novolog 501 - 550 inject 19  units Novolog, check blood sugar every 2 hours; if blood sugar does not decrease below 500 then call doctor's office. 10 mL 0 Taking  . Insulin Glargine (LANTUS) 100 UNIT/ML Solostar Pen Inject 25 Units into the skin at bedtime. 15 mL 1 Taking  . labetalol (NORMODYNE) 300 MG tablet Take 300 mg by mouth 2 (two) times daily.   Taking  . losartan (COZAAR) 50 MG tablet Take 100 mg by mouth daily.   Taking  . Multiple Vitamin (MULITIVITAMIN WITH MINERALS) TABS Take 1 tablet by mouth every morning.    Taking  . nystatin (MYCOSTATIN) 100000 UNIT/ML suspension Take 5 mLs (500,000 Units total) by mouth 4 (four) times daily. (Patient not taking: Reported on 08/20/2014) 60 mL 0 Not Taking  . pravastatin (PRAVACHOL) 40 MG tablet Take 40 mg by mouth daily.   Taking  . sodium chloride (OCEAN) 0.65 % SOLN nasal spray Place 1 spray into both nostrils as needed for congestion. 1 Bottle 0 Taking   Scheduled:  . azithromycin  500 mg Intravenous Q24H  . cloNIDine  0.3 mg Oral BID  . clopidogrel  75 mg Oral Daily  . digoxin  0.25 mg Oral Daily  . [START ON 08/21/2014] gabapentin  300 mg Oral q morning - 10a  . heparin  5,000 Units Subcutaneous 3 times per day  . hydrALAZINE  100 mg Oral TID  . insulin aspart  0-15 Units Subcutaneous 6 times per day  . [START ON 08/21/2014] labetalol  300 mg Oral BID  . polyethylene glycol  17 g Oral Daily  . [START ON 08/21/2014] pravastatin  40 mg Oral Daily  . senna-docusate  1 tablet Oral BID  . sodium chloride  3 mL Intravenous Q12H   Infusions:  . sodium chloride 100 mL/hr (08/20/14 1527)   Assessment: 59yo male presents with headache, abdominal pain, and productive cough. Pharmacy is  consulted to dose ceftriaxone for CAP. Pt is slightly febrile to 99.6, WBC 9.3, sCr 2.53. Azithromycin has also been ordered at 500mg  q24h.  Goal of Therapy:  Resolution of infection  Plan:  Ceftriaxone 1g IV q24h Continue azithromycin 500mg  q24h Expected duration 7 days with resolution of temperature and/or normalization of WBC Follow up culture results   Pharmacy will sign off for now. Please re-consult if needed.  Andrey Cota. Diona Foley, PharmD Clinical Pharmacist Pager [phone] 08/20/2014,6:08 PM

## 2014-08-20 NOTE — Progress Notes (Signed)
   Subjective:    Patient ID: Charles Hart, male    DOB: [birth year], 59 y.o.   MRN: [name]  HPI Patient presents with complaint of mid abdominal pain which started 3 days ago and which has been constant, cough productive of a small amount of brown sputum, intermittent headache, chills, diaphoresis, and shortness of breath.  Patient reports that he felt fine last week.  He reports that he has been compliant with his medications, but has not yet taken his blood pressure medications today.   Review of Systems  Constitutional: Positive for chills and diaphoresis. Negative for fever.  Respiratory: Positive for cough and shortness of breath.   Cardiovascular: Positive for leg swelling. Negative for chest pain.  Gastrointestinal: Positive for abdominal pain (Started 3 days ago, constant, midabdominal). Negative for nausea, vomiting and blood in stool.  Genitourinary: Negative for dysuria and frequency.  Neurological: Positive for dizziness (Worse when standing) and headaches (Intermittent for 2 days). Negative for syncope.        Objective:   Physical Exam  Constitutional: He appears distressed (Uncomfortable appearing).  Cardiovascular: Regular rhythm.  Tachycardia present.  Exam reveals no S3 and no S4.   No murmur heard. No leg edema  Abdominal: Soft. Bowel sounds are normal. There is tenderness in the right upper quadrant. There is guarding. There is no rigidity and no rebound.        Assessment & Plan:

## 2014-08-20 NOTE — Progress Notes (Signed)
Attempted to start NSL w/o success x2. Peja Allender,RN 08/20/13 1230PM

## 2014-08-20 NOTE — Assessment & Plan Note (Signed)
Assessment: Patient reports recent cough productive of brown sputum in small amounts, with associated chills and sweats.  Plan: Chest x-ray as part of admission workup to rule out pneumonia.

## 2014-08-20 NOTE — Assessment & Plan Note (Signed)
BP Readings from Last 3 Encounters:  08/20/14 200/100  08/04/14 186/81  06/03/14 155/65    Lab Results  Component Value Date   NA 136 06/02/2014   K 4.2 06/02/2014   CREATININE 2.11* 06/02/2014    Assessment: Patient presented with severe hypertension; he has headache which may be due to his hypertension or to his other presenting problems.  Plan: As above, admit to stepdown unit for close monitoring and management of his severe hypertension.

## 2014-08-20 NOTE — Progress Notes (Signed)
Report called to [facility] on 2 C.   Patient to be transported via wheelchair to [facility] -14.  Alert oriented. Accompanied by family member.  Sander Nephew, RN 08/20/2014 12:45 PM.

## 2014-08-20 NOTE — Assessment & Plan Note (Signed)
Assessment: Patient presents with a 3 day history of constant mid abdominal pain; exam shows right upper quadrant tenderness with guarding but no rebound.  Stat labs including comprehensive metabolic panel, lipase, CBC with differential, lactic acid, lipid panel, and urinalysis were drawn in clinic and results are pending.  Plan: Given severe hypertension, abdominal pain, and other problems the plan is to admit to stepdown unit for monitoring and management.  Abdominal ultrasound may be helpful in assessing abdominal pain; given his chronic kidney disease, CT scan with IV contrast would carry risk of worsening renal function.  Hopefully the pending lab results will shed some light on the cause of his pain.

## 2014-08-20 NOTE — H&P (Signed)
Date: 08/20/2014               Patient Name:  Charles Hart MRN: [name]  DOB: 09-Feb-1956 Age / Sex: 59 y.o., male   PCP: Axel Filler, MD         Medical Service: Internal Medicine Teaching Service         Attending Physician: Dr. Annia Belt, MD    First Contact: Dr. Marvel Plan Pager: [phone]  Second Contact: Dr. Ronnald Ramp Pager: [phone]       After Hours (After 5p/  First Contact Pager: [phone]  weekends / holidays): Second Contact Pager: [phone]   Chief Complaint: headache, abdominal pain, productive cough  History of Present Illness: [name] is a 59 yo male with PMHx of HTN, Cardiomyopathy, CHF, CVA in 2013, OSA, CKD III, T2DM and HLD who presented to the [facility] with complaint of abdominal pain, headache and productive cough. Patient states on 08/17/14 (3 days prior) he developed non-radiating, mid-abdominal pain. He describes the pain as constant, sharp, achey, 8-9/10. Nothing seems to make the pain better or worse. Pain is not associated with food intake, nor does it wax or wane. He tried alkaseltzer without relief. He admits to fever, chills, shortness of breath with a productive cough, but denies nausea, vomiting, diarrhea, sore throat, nasal congestion or chest pain. Patient admits to infrequent, occasional alcohol use, but denies tobacco abuse. His last bowel movement was 3-4 days ago which is unusual for him, but he admits to decreased appetite and fluid intake. He has taken all of his medications as prescribed and only missing his morning medications for today. His blood glucose has been well controlled at home in the 130s-180s.   Meds: Medications Prior to Admission  Medication Sig Dispense Refill  . ACCU-CHEK FASTCLIX LANCETS MISC Check blood sugar as directed up to 3 times a day.  Dx code E11.65 102 each 5  . cloNIDine (CATAPRES) 0.3 MG tablet Take 0.3 mg by mouth 2 (two) times daily.    . clopidogrel (PLAVIX) 75 MG tablet Take 75 mg by mouth daily.     . digoxin (LANOXIN) 0.25 MG tablet Take 0.25 mg by mouth daily.    . fluticasone (FLONASE) 50 MCG/ACT nasal spray Place 2 sprays into both nostrils daily as needed for allergies or rhinitis.    [name] gabapentin (NEURONTIN) 300 MG capsule Take 300 mg by mouth every morning.    [name] guaiFENesin-codeine (ROBITUSSIN AC) 100-10 MG/5ML syrup Take 5 mLs by mouth 3 (three) times daily as needed for cough. 120 mL 0  . hydrALAZINE (APRESOLINE) 50 MG tablet Take 100 mg by mouth 3 (three) times daily.    . hydrochlorothiazide (HYDRODIURIL) 25 MG tablet Take 25 mg by mouth daily.    . insulin aspart (NOVOLOG) 100 UNIT/ML injection < 150 take 7 units novolog before the meal 151- 200 inject 8 units Novolog 201- 250 inject 10 units Novolog 251- 300 inject 11 units  Novolog 301 - 350 inject 13 units Novolog 351 - 400 inject 14 units Novolog 401 - 450 inject 16 units Novolog 451 - 500 inject 17 units Novolog 501 - 550 inject 19  units Novolog, check blood sugar every 2 hours; if blood sugar does not decrease below 500 then call doctor's office. 10 mL 0  . Insulin Glargine (LANTUS) 100 UNIT/ML Solostar Pen Inject 25 Units into the skin at bedtime. 15 mL 1  . labetalol (NORMODYNE) 300 MG tablet Take 300 mg by mouth  2 (two) times daily.    [name] losartan (COZAAR) 50 MG tablet Take 100 mg by mouth daily.    . Multiple Vitamin (MULITIVITAMIN WITH MINERALS) TABS Take 1 tablet by mouth every morning.     . nystatin (MYCOSTATIN) 100000 UNIT/ML suspension Take 5 mLs (500,000 Units total) by mouth 4 (four) times daily. (Patient not taking: Reported on 08/20/2014) 60 mL 0  . pravastatin (PRAVACHOL) 40 MG tablet Take 40 mg by mouth daily.    . sodium chloride (OCEAN) 0.65 % SOLN nasal spray Place 1 spray into both nostrils as needed for congestion. 1 Bottle 0    Current Facility-Administered Medications  Medication Dose Route Frequency Provider Last Rate Last Dose  . 0.9 %  sodium chloride infusion   Intravenous Continuous Corky Sox, MD 100 mL/hr at 08/20/14 1527 100 mL/hr at 08/20/14 1527  . azithromycin (ZITHROMAX) 500 mg in dextrose 5 % 250 mL IVPB  500 mg Intravenous Q24H Corky Sox, MD      . cefTRIAXone (ROCEPHIN) 1 g in dextrose 5 % 50 mL IVPB - Premix  1 g Intravenous Q24H Rebecka Apley, RPH      . cloNIDine (CATAPRES) tablet 0.3 mg  0.3 mg Oral BID Corky Sox, MD   0.3 mg at 08/20/14 1439  . clopidogrel (PLAVIX) tablet 75 mg  75 mg Oral Daily Corky Sox, MD   75 mg at 08/20/14 1745  . digoxin (LANOXIN) tablet 0.25 mg  0.25 mg Oral Daily Corky Sox, MD   0.25 mg at 08/20/14 1439  . [START ON 08/21/2014] gabapentin (NEURONTIN) capsule 300 mg  300 mg Oral q morning - 10a Corky Sox, MD      . heparin injection 5,000 Units  5,000 Units Subcutaneous 3 times per day Corky Sox, MD      . hydrALAZINE (APRESOLINE) tablet 100 mg  100 mg Oral TID Osa Craver, MD   100 mg at 08/20/14 1744  . insulin aspart (novoLOG) injection 0-15 Units  0-15 Units Subcutaneous TID WC Corky Sox, MD      . Derrill Memo ON 08/21/2014] labetalol (NORMODYNE) tablet 300 mg  300 mg Oral BID Corky Sox, MD      . labetalol (NORMODYNE,TRANDATE) injection 10 mg  10 mg Intravenous Q2H PRN Corky Sox, MD      . ondansetron [facility]) tablet 4 mg  4 mg Oral Q6H PRN Corky Sox, MD       Or  . ondansetron [facility] injection 4 mg  4 mg Intravenous Q6H PRN Corky Sox, MD      . polyethylene glycol [facility] Floria Raveling) packet 17 g  17 g Oral Daily Corky Sox, MD      . Derrill Memo ON 08/21/2014] pravastatin (PRAVACHOL) tablet 40 mg  40 mg Oral Daily Corky Sox, MD      . senna-docusate (Senokot-S) tablet 1 tablet  1 tablet Oral BID Corky Sox, MD      . sodium chloride 0.9 % injection 3 mL  3 mL Intravenous Q12H Corky Sox, MD        Allergies: Allergies as of 08/20/2014 - Review Complete 08/20/2014  Allergen Reaction Noted  . Amlodipine Swelling 07/10/2013  . Lisinopril Cough 06/19/2013   Past Medical History  Diagnosis Date  .  Dermatitis   . CHF (congestive heart failure)     LV function improved from 2004 to 2008.  Historically, moderately dilated  LV with EF 30-40% by 2D echo 08/14/2002.  Mild CAD with severe LV dysfunction by cardiac cath 09/2002.  Normal coronary arteries and normal LV function by cardiac cath 09/19/2006.  A 2-D echo on 04/01/2009 showed mild concentric hypertrophy and normal systolic (LVEF  123456) and doppler C/W with grade 1 diastolic dysfunction.  . Hypertension   . Hyperlipidemia   . Hearing loss in right ear   . Cardiomyopathy     LV function improved from 2004 to 2008.  Historically, moderately dilated LV with EF 30-40% by 2D echo 08/14/2002.  Mild CAD with severe LV dysfunction by cardiac cath 09/2002.  Normal coronary arteries and normal LV function by cardiac cath 09/19/2006.  A 2-D echo on 04/01/2009 showed mild concentric hypertrophy and normal systolic (LVEF  123456) and doppler C/W with grade 1 diastolic dysfunction.  . DM neuropathy, painful   . CVA (cerebral vascular accident) 07/04/2012    MRI of the brain 07/04/2012 showed an acute infarct in the right basal ganglia involving the anterior putamen, anterior limb internal capsule, and head of the caudate; this measured approximately 2.5 cm in diameter.     . Hypertensive crisis 07/28/2012  . Myocardial infarction   . Diabetes mellitus     type 2   Past Surgical History  Procedure Laterality Date  . Colonoscopy    . Polypectomy    . Cardiac catheterization      3 times  . Foot surgery     Family History  Problem Relation Age of Onset  . Colon cancer Sister   . Aneurysm Father 65    died of rupture   History   Social History  . Marital Status: Married    Spouse Name: N/A    Number of Children: N/A  . Years of Education: N/A   Occupational History  . Not on file.   Social History Main Topics  . Smoking status: Never Smoker   . Smokeless tobacco: Never Used  . Alcohol Use: Yes     Comment: Wine occasionally (no more  than 2 glasses per month)  . Drug Use: No  . Sexual Activity: Not on file   Other Topics Concern  . Not on file   Social History Narrative    Review of Systems: General: Admits to fever, chills, dizziness, fatigue, decreased appetite and diaphoresis.  Respiratory: Admits to SOB, productive cough, but denies chest tightness, and wheezing.   Cardiovascular: Denies chest pain and palpitations.  Gastrointestinal: Admits to abdominal pain, but denies nausea, vomiting, diarrhea, blood in stool and abdominal distention.  Genitourinary: Denies dysuria Skin: Denies pallor, rash and wounds.  Neurological: Admits to dizziness, headache, weakness, lightheadedness, but denies focal weakness, facial droop, numbness. Psychiatric/Behavioral: Denies mood changes, confusion, nervousness, sleep disturbance and agitation.  Physical Exam: Admission Vital Signs: T 99.5, BP 215/111, HR 102, Pulse Ox 100% room air  Filed Vitals:   08/20/14 1322 08/20/14 1730 08/20/14 1744  BP: 232/114 200/98 200/98  Pulse: 108 93   Temp: 99.6 F (37.6 C) 99.6 F (37.6 C)   TempSrc: Oral Oral   Resp: 34 34   Height: 6' (1.829 m)    Weight: 94.3 kg (207 lb 14.3 oz)    SpO2: 92% 100%    General: Vital signs reviewed.  Patient is in mild acute distress and cooperative with exam.  Head: No tenderness over temporal artery.  Eyes: EOMI, conjunctivae normal, no scleral icterus, PERRLA.  Cardiovascular: Tachycardic, S1 normal, S2 normal, no murmurs, gallops,  or rubs. Pulmonary/Chest: Decreased breath sounds bilaterally, no wheezes, rales, or rhonchi. Abdominal: Tender in mid-abdominal region, superior to umbilicus, but diffusely tender on palpation with guarding present.  Extremities: No lower extremity edema bilaterally Neurological: A&O x3, Strength is normal and symmetric bilaterally, cranial nerve II-XII are grossly intact, no focal motor deficit, sensory intact to light touch bilaterally.  Skin: Warm, dry and intact.  No rashes or erythema. Psychiatric: Normal mood and affect. speech and behavior is normal. Cognition and memory are normal.    Recent Labs  08/20/14 1229  LIPASE 43   CBC:  Recent Labs  08/20/14 1229  WBC 9.3  NEUTROABS 7.5  HGB 10.2*  HCT 31.0*  MCV 80.7  PLT 257   CBG:  Recent Labs  08/20/14 1235 08/20/14 1321  GLUCAP 204* 191*   Urine Drug Screen: Drugs of Abuse     Component Value Date/Time   LABOPIA NONE DETECTED 07/03/2012 2346   COCAINSCRNUR NONE DETECTED 07/03/2012 2346   LABBENZ NONE DETECTED 07/03/2012 2346   AMPHETMU NONE DETECTED 07/03/2012 2346   THCU NONE DETECTED 07/03/2012 2346   LABBARB NONE DETECTED 07/03/2012 2346     Assessment & Plan by Problem: Active Problems:   Hypertensive urgency  Hypertensive Urgency: BP on admission was 215/111. Patient admits he has not taken his BP medications since yesterday. He also admits to right sided headache since yesterday. He denies neck stiffness, photophobia or phonophobia, nausea or vomiting. Hypertensive urgency is likely secondary to missed medications and pain. Patient is on Clonidine 0.3 mg BID, Hydralazine 100 mg TID, HCTZ 25 mg daily, labetalol 300 mg BID and losartan 100 mg daily at home. Patient was given labetalol 10 mg IV once. -Continue clonidine 0.3 mg BID -Continue hydralazine 100 mg TID -Continue Labetalol 300 mg BID -Cardiac monitor -Hold losartan and hctz in setting of AKI  CAP:  Patient admits to a productive cough of brown sputum for 3-4 days. He admits to shortness of breath with exertion and at rest and fever with chills. He denies nasal congestion or sore throat. Patient did not receive his flu shot. Patient was desaturating to 88% on room air in the SDU and was place on oxygen via nasal cannula. Patient describes a non-radiating, constant severe pain in his mid-abdomen without associated symptoms of nausea, vomiting or diarrhea. Patient does have loss of appetite, feels feverish and  admits to diaphoresis. Lipase normal at 43 and patient denies alcohol abuse. Glucose 208 on admission. AST and ALT normal at 26 and 15, respectively. Lactic acid normal at 1.2. Total bilirubin normal at 1.0. TG 183. Urinalysis shows negative nitrites, negative leukocytes, large hemoglobin, negative ketones, rare bacteria. CXR and Abdominal Xray showed patchy infiltrate in the right upper and lower lung, worse in the right lung base. Non specific abdomen. Films are concerning for pneumonia, which is likely causing his abdominal pain through referred pain. -Azithromycin 500 mg IV daily -Ceftriaxone 1 gram daily -Miralax 17 grams x 2 doses -Senokot-S 1 tablet BID x 3 doses -Zofran 4 mg Q6H prn -CBC/BMET tomorrow am -Supplemental oxygen -NS 100 mL/hr for 24 hours -Cardiac Monitor -Clear Liquid Diet -Trend troponins  Hypokalemia: Potassium 3.3.  -KDur 40 meq once  H/o of CVA in 2013: Patient is on plavix 75 mg daily at home. He admits to headache, but denies focal deficits, numbness, changes in vision or hearing. CT Head showed Old large right basal ganglia lacunar infarct, but no acute intracranial abnormalities.  -Continue plavix 75 mg daily  Cardiomyopathy: Patient denies chest pain. He is on Plavix 75 mg daily, digoxin 0.25 mg daily, losartan, labetalol, HCTZ, hydralazine, and clonidine as listed above. -Continue home medications  T2DM: Last HgbA1c 11.0 on 04/25/14. Patient is on SSI and Lantus 25 units at home. He glucose has been in the 130s-180s recently. Today on admission, CBG 208. Patient is not in DKA.  -SSI-Moderate -CBG Q4H  -Clear liquid diet  HLD: Cholesterol panel shows total cholesterol of 189, TG 183, HDL 37, LDL 115. Patient is on pravastatin 40 mg daily at home. -Continue pravastatin 40 mg daily  DVT/PE ppx: Heparin SQ TID  Dispo: Disposition is deferred at this time, awaiting improvement of current medical problems. Anticipated discharge in approximately 1-2 day(s).    The patient does have a current PCP Axel Filler, MD) and does need an [facility] hospital follow-up appointment after discharge.  The patient does not have transportation limitations that hinder transportation to clinic appointments.  Signed: Osa Craver, DO PGY-1 Internal Medicine Resident Pager # [phone] 08/20/2014 6:29 PM

## 2014-08-21 DIAGNOSIS — E785 Hyperlipidemia, unspecified: Secondary | ICD-10-CM

## 2014-08-21 DIAGNOSIS — I10 Essential (primary) hypertension: Secondary | ICD-10-CM

## 2014-08-21 DIAGNOSIS — J189 Pneumonia, unspecified organism: Secondary | ICD-10-CM | POA: Diagnosis present

## 2014-08-21 DIAGNOSIS — R109 Unspecified abdominal pain: Secondary | ICD-10-CM

## 2014-08-21 DIAGNOSIS — I429 Cardiomyopathy, unspecified: Secondary | ICD-10-CM

## 2014-08-21 DIAGNOSIS — E119 Type 2 diabetes mellitus without complications: Secondary | ICD-10-CM

## 2014-08-21 DIAGNOSIS — Z8673 Personal history of transient ischemic attack (TIA), and cerebral infarction without residual deficits: Secondary | ICD-10-CM

## 2014-08-21 DIAGNOSIS — E876 Hypokalemia: Secondary | ICD-10-CM

## 2014-08-21 LAB — CBC
HCT: 29.6 % — ABNORMAL LOW (ref 39.0–52.0)
Hemoglobin: 9.9 g/dL — ABNORMAL LOW (ref 13.0–17.0)
MCH: 27.9 pg (ref 26.0–34.0)
MCHC: 33.4 g/dL (ref 30.0–36.0)
MCV: 83.4 fL (ref 78.0–100.0)
Platelets: 210 10*3/uL (ref 150–400)
RBC: 3.55 MIL/uL — ABNORMAL LOW (ref 4.22–5.81)
RDW: 12.6 % (ref 11.5–15.5)
WBC: 8.8 10*3/uL (ref 4.0–10.5)

## 2014-08-21 LAB — BASIC METABOLIC PANEL
Anion gap: 13 (ref 5–15)
BUN: 41 mg/dL — ABNORMAL HIGH (ref 6–23)
CHLORIDE: 101 meq/L (ref 96–112)
CO2: 21 mmol/L (ref 19–32)
Calcium: 7.8 mg/dL — ABNORMAL LOW (ref 8.4–10.5)
Creatinine, Ser: 2.38 mg/dL — ABNORMAL HIGH (ref 0.50–1.35)
GFR calc Af Amer: 33 mL/min — ABNORMAL LOW (ref >90)
GFR calc non Af Amer: 29 mL/min — ABNORMAL LOW (ref >90)
GLUCOSE: 242 mg/dL — AB (ref 70–99)
POTASSIUM: 4 mmol/L (ref 3.5–5.1)
Sodium: 135 mmol/L (ref 135–145)

## 2014-08-21 LAB — GLUCOSE, CAPILLARY
GLUCOSE-CAPILLARY: 158 mg/dL — AB (ref 70–99)
GLUCOSE-CAPILLARY: 166 mg/dL — AB (ref 70–99)
GLUCOSE-CAPILLARY: 203 mg/dL — AB (ref 70–99)
GLUCOSE-CAPILLARY: 235 mg/dL — AB (ref 70–99)
Glucose-Capillary: 185 mg/dL — ABNORMAL HIGH (ref 70–99)

## 2014-08-21 LAB — TROPONIN I: Troponin I: 0.09 ng/mL — ABNORMAL HIGH (ref <0.031)

## 2014-08-21 NOTE — Progress Notes (Signed)
Utilization Review Completed.Charles Hart T1/02/2015  

## 2014-08-21 NOTE — Progress Notes (Signed)
Subjective:  Patient was seen and examined this morning. Patient states he feel better than yesterday. His headache and abdominal pain have resolved. He continues to have shortness of breath and a productive cough.   Objective: Vital signs in last 24 hours: Filed Vitals:   08/21/14 0019 08/21/14 0324 08/21/14 0804 08/21/14 1135  BP: 158/81 152/80 165/78 157/80  Pulse: 95 92 98 85  Temp:  100.1 F (37.8 C) 99.9 F (37.7 C) 99.6 F (37.6 C)  TempSrc:  Oral Oral Oral  Resp: 28 37 31 35  Height:  6' (1.829 m)    Weight:  95.5 kg (210 lb 8.6 oz)    SpO2: 90% 96% 96% 92%   Weight change:   Intake/Output Summary (Last 24 hours) at 08/21/14 1556 Last data filed at 08/21/14 1100  Gross per 24 hour  Intake   1335 ml  Output   1200 ml  Net    135 ml   General: Vital signs reviewed. Patient is in no acute distress and cooperative with exam.  Cardiovascular: Tachycardic, regular rhythm, S1 normal, S2 normal, no murmurs, gallops, or rubs. Pulmonary/Chest: Decreased breath sounds bilaterally, no wheezes, rales, or rhonchi. Abdominal: Soft, non-tender to palpation, normoactive BS, non-distended Extremities: No lower extremity edema bilaterally Skin: Warm, dry and intact. No rashes or erythema. Psychiatric: Normal mood and affect. speech and behavior is normal. Cognition and memory are normal.   Lab Results: Basic Metabolic Panel:  Recent Labs Lab 08/20/14 1229 08/21/14 0009  NA 137 135  K 3.3* 4.0  CL 97 101  CO2 25 21  GLUCOSE 208* 242*  BUN 42* 41*  CREATININE 2.53* 2.38*  CALCIUM 8.4 7.8*   Liver Function Tests:  Recent Labs Lab 08/20/14 1229  AST 26  ALT 15  ALKPHOS 59  BILITOT 1.0  PROT 7.0  ALBUMIN 3.1*    Recent Labs Lab 08/20/14 1229  LIPASE 43   CBC:  Recent Labs Lab 08/20/14 1229 08/21/14 0009  WBC 9.3 8.8  NEUTROABS 7.5  --   HGB 10.2* 9.9*  HCT 31.0* 29.6*  MCV 80.7 83.4  PLT 257 210   Cardiac Enzymes:  Recent Labs Lab  08/20/14 1229 08/20/14 2020 08/21/14 0009  TROPONINI 0.11* 0.11* 0.09*   CBG:  Recent Labs Lab 08/20/14 1727 08/20/14 2051 08/21/14 0005 08/21/14 0621 08/21/14 0810 08/21/14 1217  GLUCAP 174* 170* 235* 158* 185* 166*   Fasting Lipid Panel:  Recent Labs Lab 08/20/14 1229  CHOL 189  HDL 37*  LDLCALC 115*  TRIG 183*  CHOLHDL 5.1   Urine Drug Screen: Drugs of Abuse     Component Value Date/Time   LABOPIA NONE DETECTED 07/03/2012 2346   COCAINSCRNUR NONE DETECTED 07/03/2012 2346   LABBENZ NONE DETECTED 07/03/2012 2346   AMPHETMU NONE DETECTED 07/03/2012 2346   THCU NONE DETECTED 07/03/2012 2346   LABBARB NONE DETECTED 07/03/2012 2346    Urinalysis:  Recent Labs Lab 08/20/14 1300 08/20/14 1520  COLORURINE YELLOW YELLOW  LABSPEC 1.017 1.018  PHURINE 6.0 6.0  GLUCOSEU 250* 100*  HGBUR LARGE* MODERATE*  BILIRUBINUR NEGATIVE NEGATIVE  KETONESUR NEGATIVE NEGATIVE  PROTEINUR >300* >300*  UROBILINOGEN 2.0* 2.0*  NITRITE NEGATIVE NEGATIVE  LEUKOCYTESUR NEGATIVE NEGATIVE    Micro Results: Recent Results (from the past 240 hour(s))  MRSA PCR Screening     Status: None   Collection Time: 08/20/14  1:19 PM  Result Value Ref Range Status   MRSA by PCR NEGATIVE NEGATIVE Final    Comment:  The GeneXpert MRSA Assay (FDA approved for NASAL specimens only), is one component of a comprehensive MRSA colonization surveillance program. It is not intended to diagnose MRSA infection nor to guide or monitor treatment for MRSA infections.    Studies/Results: Ct Head Wo Contrast  08/20/2014   CLINICAL DATA:  RIGHT-sided headache since yesterday, hypertensive urgency, history CHF, hypertension, stroke, MI, diabetes  EXAM: CT HEAD WITHOUT CONTRAST  TECHNIQUE: Contiguous axial images were obtained from the base of the skull through the vertex without intravenous contrast.  COMPARISON:  07/03/2012  FINDINGS: Mild generalized atrophy.  Normal ventricular morphology.  No  midline shift or mass effect.  Large old RIGHT basal ganglia lacunar infarct.  No intracranial hemorrhage, mass lesion or evidence acute infarction.  No extra-axial fluid collections.  Bones and sinuses unremarkable.  IMPRESSION: Old large RIGHT basal ganglia lacunar infarct.  No acute intracranial abnormalities.   Electronically Signed   By: Lavonia Dana M.D.   On: 08/20/2014 17:19   Dg Abd Acute W/chest  08/20/2014   CLINICAL DATA:  Abdominal pain for 3 days, cough and shortness of Breath  EXAM: ACUTE ABDOMEN SERIES (ABDOMEN 2 VIEW & CHEST 1 VIEW)  COMPARISON:  01/20/2014  FINDINGS: Cardiac shadow is stable. The lungs are well aerated bilaterally although right basilar infiltrate is seen consistent with acute pneumonia. Some patchy changes are noted within the right upper lobe as well.  Scattered large and small bowel gas is noted. No obstructive changes are seen. No free air is noted. Degenerative changes of the hip joints and lumbar spine are seen. No other focal abnormality is noted.  IMPRESSION: Patchy infiltrate in the right upper and lower lung. This is worse in the right lung base.  Nonspecific abdomen.   Electronically Signed   By: Inez Catalina M.D.   On: 08/20/2014 17:06   Medications:  I have reviewed the patient's current medications. Prior to Admission:  Prescriptions prior to admission  Medication Sig Dispense Refill Last Dose  . cloNIDine (CATAPRES) 0.3 MG tablet Take 0.3 mg by mouth 2 (two) times daily.   Past Week at Unknown time  . clopidogrel (PLAVIX) 75 MG tablet Take 75 mg by mouth daily.   Past Week at Unknown time  . digoxin (LANOXIN) 0.25 MG tablet Take 0.25 mg by mouth daily.   Past Week at Unknown time  . fluticasone (FLONASE) 50 MCG/ACT nasal spray Place 2 sprays into both nostrils daily as needed for allergies or rhinitis.   Past Week at Unknown time  . gabapentin (NEURONTIN) 300 MG capsule Take 300 mg by mouth every morning.   Past Week at Unknown time  .  guaiFENesin-codeine (ROBITUSSIN AC) 100-10 MG/5ML syrup Take 5 mLs by mouth 3 (three) times daily as needed for cough. 120 mL 0 Past Week at Unknown time  . hydrALAZINE (APRESOLINE) 50 MG tablet Take 100 mg by mouth 3 (three) times daily.   Past Week at Unknown time  . hydrochlorothiazide (HYDRODIURIL) 25 MG tablet Take 25 mg by mouth daily.   Past Week at Unknown time  . insulin aspart (NOVOLOG) 100 UNIT/ML injection < 150 take 7 units novolog before the meal 151- 200 inject 8 units Novolog 201- 250 inject 10 units Novolog 251- 300 inject 11 units  Novolog 301 - 350 inject 13 units Novolog 351 - 400 inject 14 units Novolog 401 - 450 inject 16 units Novolog 451 - 500 inject 17 units Novolog 501 - 550 inject 19  units Novolog, check blood  sugar every 2 hours; if blood sugar does not decrease below 500 then call doctor's office. 10 mL 0 Past Week at Unknown time  . Insulin Glargine (LANTUS) 100 UNIT/ML Solostar Pen Inject 25 Units into the skin at bedtime. 15 mL 1 Past Week at Unknown time  . losartan (COZAAR) 50 MG tablet Take 100 mg by mouth daily.   Past Week at Unknown time  . Multiple Vitamin (MULITIVITAMIN WITH MINERALS) TABS Take 1 tablet by mouth every morning.    Past Week at Unknown time  . pravastatin (PRAVACHOL) 40 MG tablet Take 40 mg by mouth daily.   Past Week at Unknown time  . sodium chloride (OCEAN) 0.65 % SOLN nasal spray Place 1 spray into both nostrils as needed for congestion. 1 Bottle 0 Past Week at Unknown time  . ACCU-CHEK FASTCLIX LANCETS MISC Check blood sugar as directed up to 3 times a day.  Dx code E11.65 102 each 5 Taking  . labetalol (NORMODYNE) 300 MG tablet Take 300 mg by mouth 2 (two) times daily.   08/19/2014 at 1900  . nystatin (MYCOSTATIN) 100000 UNIT/ML suspension Take 5 mLs (500,000 Units total) by mouth 4 (four) times daily. (Patient not taking: Reported on 08/20/2014) 60 mL 0 Not Taking at Unknown time   Scheduled Meds: . azithromycin  500 mg Intravenous Q24H   . cefTRIAXone (ROCEPHIN)  IV  1 g Intravenous Q24H  . cloNIDine  0.3 mg Oral BID  . clopidogrel  75 mg Oral Daily  . digoxin  0.25 mg Oral Daily  . gabapentin  300 mg Oral q morning - 10a  . heparin  5,000 Units Subcutaneous 3 times per day  . hydrALAZINE  100 mg Oral TID  . insulin aspart  0-15 Units Subcutaneous TID WC  . labetalol  300 mg Oral BID  . pravastatin  40 mg Oral Daily  . senna-docusate  1 tablet Oral BID  . sodium chloride  3 mL Intravenous Q12H   Continuous Infusions:   PRN Meds:.labetalol, ondansetron **OR** ondansetron (ZOFRAN) IV Assessment/Plan: Active Problems:   Hypertensive urgency   CAP (community acquired pneumonia)  Hypertensive Urgency: BP on admission was 215/111>165/78 this morning. 130/60s this afternoon. Patient is on home clonidine, hydralazine, and labetalol with improvement of BP.  -Continue clonidine 0.3 mg BID -Continue hydralazine 100 mg TID -Continue Labetalol 300 mg BID -Cardiac monitor -Hold losartan and hctz in setting of AKI  CAP:CXR showed patchy infiltrate in the right upper and lower lung, worse in the right lung base. Patient continues to have low grade temperatures 99-100, shortness of breath, productive cough. Patient is satting well on oxygen via [place]. -Azithromycin 500 mg IV daily -Ceftriaxone 1 gram daily -CBC/BMET tomorrow am -Supplemental oxygen -Cardiac Monitor -Carb Modified Diet  AKI in setting of CKD: Resolved. BUN/Cr improved from 42/2.53 > 41/2.38 this morning. Patient does not have a clear baseline as BUN/Cr has been increasing over the past several years. Most recent baseline appears to be around 2.25. Patient received fluids for 24 hours. -Repeat BMET tomorrow am  Hypokalemia: Potassium 3.3>4.0.  -Resolved -Repeat BMET tomorrow am  T2DM: Last HgbA1c 11.0 on 04/25/14. Patient is on SSI and Lantus 25 units at home.  -SSI-Moderate -CBG Q4H  -Carb Modified Diet  DVT/PE ppx: Heparin SQ TID  Dispo: Disposition  is deferred at this time, awaiting improvement of current medical problems.  Anticipated discharge in approximately 1-2 day(s).   The patient does have a current PCP Axel Filler, MD)  and does need an [facility] hospital follow-up appointment after discharge.  The patient does not have transportation limitations that hinder transportation to clinic appointments.  .Services Needed at time of discharge: Y = Yes, Blank = No PT:   OT:   RN:   Equipment:   Other:     LOS: 1 day   Osa Craver, DO PGY-1 Internal Medicine Resident Pager # [phone] 08/21/2014 3:56 PM

## 2014-08-22 DIAGNOSIS — D649 Anemia, unspecified: Secondary | ICD-10-CM

## 2014-08-22 DIAGNOSIS — I129 Hypertensive chronic kidney disease with stage 1 through stage 4 chronic kidney disease, or unspecified chronic kidney disease: Secondary | ICD-10-CM

## 2014-08-22 DIAGNOSIS — N189 Chronic kidney disease, unspecified: Secondary | ICD-10-CM

## 2014-08-22 DIAGNOSIS — N179 Acute kidney failure, unspecified: Secondary | ICD-10-CM

## 2014-08-22 LAB — CBC
HCT: 23.1 % — ABNORMAL LOW (ref 39.0–52.0)
HEMOGLOBIN: 7.7 g/dL — AB (ref 13.0–17.0)
MCH: 28.3 pg (ref 26.0–34.0)
MCHC: 33.3 g/dL (ref 30.0–36.0)
MCV: 84.9 fL (ref 78.0–100.0)
PLATELETS: 230 10*3/uL (ref 150–400)
RBC: 2.72 MIL/uL — ABNORMAL LOW (ref 4.22–5.81)
RDW: 13 % (ref 11.5–15.5)
WBC: 6.1 10*3/uL (ref 4.0–10.5)

## 2014-08-22 LAB — GLUCOSE, CAPILLARY
GLUCOSE-CAPILLARY: 156 mg/dL — AB (ref 70–99)
Glucose-Capillary: 158 mg/dL — ABNORMAL HIGH (ref 70–99)
Glucose-Capillary: 167 mg/dL — ABNORMAL HIGH (ref 70–99)
Glucose-Capillary: 206 mg/dL — ABNORMAL HIGH (ref 70–99)
Glucose-Capillary: 157 mg/dL — ABNORMAL HIGH (ref 70–99)

## 2014-08-22 LAB — BASIC METABOLIC PANEL
ANION GAP: 5 (ref 5–15)
BUN: 52 mg/dL — ABNORMAL HIGH (ref 6–23)
CO2: 27 mmol/L (ref 19–32)
CREATININE: 2.86 mg/dL — AB (ref 0.50–1.35)
Calcium: 7.2 mg/dL — ABNORMAL LOW (ref 8.4–10.5)
Chloride: 102 mEq/L (ref 96–112)
GFR calc non Af Amer: 23 mL/min — ABNORMAL LOW (ref >90)
GFR, EST AFRICAN AMERICAN: 27 mL/min — AB (ref >90)
Glucose, Bld: 157 mg/dL — ABNORMAL HIGH (ref 70–99)
POTASSIUM: 4.2 mmol/L (ref 3.5–5.1)
SODIUM: 134 mmol/L — AB (ref 135–145)

## 2014-08-22 LAB — RETICULOCYTES
RBC.: 2.96 MIL/uL — ABNORMAL LOW (ref 4.22–5.81)
Retic Count, Absolute: 32.6 10*3/uL (ref 19.0–186.0)
Retic Ct Pct: 1.1 % (ref 0.4–3.1)

## 2014-08-22 LAB — OCCULT BLOOD X 1 CARD TO LAB, STOOL: FECAL OCCULT BLD: NEGATIVE

## 2014-08-22 LAB — LACTATE DEHYDROGENASE: LDH: 221 U/L (ref 94–250)

## 2014-08-22 MED ORDER — LEVOFLOXACIN IN D5W 750 MG/150ML IV SOLN
750.0000 mg | INTRAVENOUS | Status: DC
  Administered 2014-08-22: 750 mg via INTRAVENOUS
  Filled 2014-08-22: qty 150

## 2014-08-22 MED ORDER — SODIUM CHLORIDE 0.9 % IV SOLN
INTRAVENOUS | Status: AC
  Administered 2014-08-22: 15:00:00 via INTRAVENOUS

## 2014-08-22 MED ORDER — SODIUM CHLORIDE 0.9 % IV SOLN: INTRAVENOUS | Status: DC

## 2014-08-22 NOTE — Progress Notes (Signed)
Patient ID: Charles Hart, male   DOB: Feb 28, 1956, 59 y.o.   MRN: [name] Medicine attending: I personally interviewed and examined this patient together with resident physician Dr. Osa Craver and I concur with her evaluation and management plan. He has had an unexplained sudden fall in his hemoglobin with no obvious clinical bleeding although he reports dark stools. We will evaluate him for GI blood loss and possible drug related hemolysis. His other medical problems are stabilizing with treatment outlined in Dr. Nena Alexander note.

## 2014-08-22 NOTE — Progress Notes (Addendum)
Subjective:  Patient was seen and examined this morning. Patient is doing well, shortness of breath improved, productive cough continues, but improved from prior. Patient's abdominal pain and headache are completely resolved.   Objective: Vital signs in last 24 hours: Filed Vitals:   08/22/14 0400 08/22/14 0500 08/22/14 0800 08/22/14 1128  BP: 135/69  164/88   Pulse: 73  74   Temp: 98.9 F (37.2 C)  98.2 F (36.8 C)   TempSrc: Oral  Oral   Resp: 20  18   Height:      Weight:  97.4 kg (214 lb 11.7 oz)    SpO2: 100%  97% 99%   Weight change: 3.1 kg (6 lb 13.4 oz)  Intake/Output Summary (Last 24 hours) at 08/22/14 1546 Last data filed at 08/22/14 0800  Gross per 24 hour  Intake    600 ml  Output    825 ml  Net   -225 ml   General: Vital signs reviewed. Patient is in no acute distress and cooperative with exam.  Cardiovascular: Regular rate, regular rhythm, S1 normal, S2 normal Pulmonary/Chest: Rhonchi in lower lung fields, no wheezes or rales. Abdominal: Soft, non-tender to palpation, normoactive BS, non-distended Extremities: No lower extremity edema bilaterally Skin: Warm, dry and intact. No rashes or erythema. Psychiatric: Normal mood and affect. speech and behavior is normal. Cognition and memory are normal.   Lab Results: Basic Metabolic Panel:  Recent Labs Lab 08/21/14 0009 08/22/14 0400  NA 135 134*  K 4.0 4.2  CL 101 102  CO2 21 27  GLUCOSE 242* 157*  BUN 41* 52*  CREATININE 2.38* 2.86*  CALCIUM 7.8* 7.2*   Liver Function Tests:  Recent Labs Lab 08/20/14 1229  AST 26  ALT 15  ALKPHOS 59  BILITOT 1.0  PROT 7.0  ALBUMIN 3.1*    Recent Labs Lab 08/20/14 1229  LIPASE 43   CBC:  Recent Labs Lab 08/20/14 1229 08/21/14 0009 08/22/14 0400  WBC 9.3 8.8 6.1  NEUTROABS 7.5  --   --   HGB 10.2* 9.9* 7.7*  HCT 31.0* 29.6* 23.1*  MCV 80.7 83.4 84.9  PLT 257 210 230   Cardiac Enzymes:  Recent Labs Lab 08/20/14 1229 08/20/14 2020  08/21/14 0009  TROPONINI 0.11* 0.11* 0.09*   CBG:  Recent Labs Lab 08/21/14 0810 08/21/14 1217 08/21/14 1625 08/21/14 2241 08/22/14 0801 08/22/14 1230  GLUCAP 185* 166* 203* 158* 156* 167*   Fasting Lipid Panel:  Recent Labs Lab 08/20/14 1229  CHOL 189  HDL 37*  LDLCALC 115*  TRIG 183*  CHOLHDL 5.1   Urine Drug Screen: Drugs of Abuse     Component Value Date/Time   LABOPIA NONE DETECTED 07/03/2012 2346   COCAINSCRNUR NONE DETECTED 07/03/2012 2346   LABBENZ NONE DETECTED 07/03/2012 2346   AMPHETMU NONE DETECTED 07/03/2012 2346   THCU NONE DETECTED 07/03/2012 2346   LABBARB NONE DETECTED 07/03/2012 2346    Urinalysis:  Recent Labs Lab 08/20/14 1300 08/20/14 1520  COLORURINE YELLOW YELLOW  LABSPEC 1.017 1.018  PHURINE 6.0 6.0  GLUCOSEU 250* 100*  HGBUR LARGE* MODERATE*  BILIRUBINUR NEGATIVE NEGATIVE  KETONESUR NEGATIVE NEGATIVE  PROTEINUR >300* >300*  UROBILINOGEN 2.0* 2.0*  NITRITE NEGATIVE NEGATIVE  LEUKOCYTESUR NEGATIVE NEGATIVE    Micro Results: Recent Results (from the past 240 hour(s))  Culture, blood (routine x 2)     Status: None (Preliminary result)   Collection Time: 08/20/14  7:00 AM  Result Value Ref Range Status   Specimen  Description BLOOD RIGHT ARM  Final   Special Requests   Final    BOTTLES DRAWN AEROBIC AND ANAEROBIC 10CC AER,5CC ANA   Culture   Final           BLOOD CULTURE RECEIVED NO GROWTH TO DATE CULTURE WILL BE HELD FOR 5 DAYS BEFORE ISSUING A FINAL NEGATIVE REPORT Performed at [facility]    Report Status PENDING  Incomplete  MRSA PCR Screening     Status: None   Collection Time: 08/20/14  1:19 PM  Result Value Ref Range Status   MRSA by PCR NEGATIVE NEGATIVE Final    Comment:        The GeneXpert MRSA Assay (FDA approved for NASAL specimens only), is one component of a comprehensive MRSA colonization surveillance program. It is not intended to diagnose MRSA infection nor to guide or monitor treatment  for MRSA infections.   Culture, blood (routine x 2)     Status: None (Preliminary result)   Collection Time: 08/20/14  6:55 PM  Result Value Ref Range Status   Specimen Description BLOOD LEFT ARM  Final   Special Requests BOTTLES DRAWN AEROBIC ONLY 2CC  Final   Culture   Final           BLOOD CULTURE RECEIVED NO GROWTH TO DATE CULTURE WILL BE HELD FOR 5 DAYS BEFORE ISSUING A FINAL NEGATIVE REPORT Performed at [facility]    Report Status PENDING  Incomplete   Studies/Results: Ct Head Wo Contrast  08/20/2014   CLINICAL DATA:  RIGHT-sided headache since yesterday, hypertensive urgency, history CHF, hypertension, stroke, MI, diabetes  EXAM: CT HEAD WITHOUT CONTRAST  TECHNIQUE: Contiguous axial images were obtained from the base of the skull through the vertex without intravenous contrast.  COMPARISON:  07/03/2012  FINDINGS: Mild generalized atrophy.  Normal ventricular morphology.  No midline shift or mass effect.  Large old RIGHT basal ganglia lacunar infarct.  No intracranial hemorrhage, mass lesion or evidence acute infarction.  No extra-axial fluid collections.  Bones and sinuses unremarkable.  IMPRESSION: Old large RIGHT basal ganglia lacunar infarct.  No acute intracranial abnormalities.   Electronically Signed   By: Lavonia Dana M.D.   On: 08/20/2014 17:19   Dg Abd Acute W/chest  08/20/2014   CLINICAL DATA:  Abdominal pain for 3 days, cough and shortness of Breath  EXAM: ACUTE ABDOMEN SERIES (ABDOMEN 2 VIEW & CHEST 1 VIEW)  COMPARISON:  01/20/2014  FINDINGS: Cardiac shadow is stable. The lungs are well aerated bilaterally although right basilar infiltrate is seen consistent with acute pneumonia. Some patchy changes are noted within the right upper lobe as well.  Scattered large and small bowel gas is noted. No obstructive changes are seen. No free air is noted. Degenerative changes of the hip joints and lumbar spine are seen. No other focal abnormality is noted.  IMPRESSION: Patchy  infiltrate in the right upper and lower lung. This is worse in the right lung base.  Nonspecific abdomen.   Electronically Signed   By: Inez Catalina M.D.   On: 08/20/2014 17:06   Medications:  I have reviewed the patient's current medications. Prior to Admission:  Prescriptions prior to admission  Medication Sig Dispense Refill Last Dose  . cloNIDine (CATAPRES) 0.3 MG tablet Take 0.3 mg by mouth 2 (two) times daily.   Past Week at Unknown time  . clopidogrel (PLAVIX) 75 MG tablet Take 75 mg by mouth daily.   Past Week at Unknown time  .  digoxin (LANOXIN) 0.25 MG tablet Take 0.25 mg by mouth daily.   Past Week at Unknown time  . fluticasone (FLONASE) 50 MCG/ACT nasal spray Place 2 sprays into both nostrils daily as needed for allergies or rhinitis.   Past Week at Unknown time  . gabapentin (NEURONTIN) 300 MG capsule Take 300 mg by mouth every morning.   Past Week at Unknown time  . guaiFENesin-codeine (ROBITUSSIN AC) 100-10 MG/5ML syrup Take 5 mLs by mouth 3 (three) times daily as needed for cough. 120 mL 0 Past Week at Unknown time  . hydrALAZINE (APRESOLINE) 50 MG tablet Take 100 mg by mouth 3 (three) times daily.   Past Week at Unknown time  . hydrochlorothiazide (HYDRODIURIL) 25 MG tablet Take 25 mg by mouth daily.   Past Week at Unknown time  . insulin aspart (NOVOLOG) 100 UNIT/ML injection < 150 take 7 units novolog before the meal 151- 200 inject 8 units Novolog 201- 250 inject 10 units Novolog 251- 300 inject 11 units  Novolog 301 - 350 inject 13 units Novolog 351 - 400 inject 14 units Novolog 401 - 450 inject 16 units Novolog 451 - 500 inject 17 units Novolog 501 - 550 inject 19  units Novolog, check blood sugar every 2 hours; if blood sugar does not decrease below 500 then call doctor's office. 10 mL 0 Past Week at Unknown time  . Insulin Glargine (LANTUS) 100 UNIT/ML Solostar Pen Inject 25 Units into the skin at bedtime. 15 mL 1 Past Week at Unknown time  . losartan (COZAAR) 50 MG  tablet Take 100 mg by mouth daily.   Past Week at Unknown time  . Multiple Vitamin (MULITIVITAMIN WITH MINERALS) TABS Take 1 tablet by mouth every morning.    Past Week at Unknown time  . pravastatin (PRAVACHOL) 40 MG tablet Take 40 mg by mouth daily.   Past Week at Unknown time  . sodium chloride (OCEAN) 0.65 % SOLN nasal spray Place 1 spray into both nostrils as needed for congestion. 1 Bottle 0 Past Week at Unknown time  . ACCU-CHEK FASTCLIX LANCETS MISC Check blood sugar as directed up to 3 times a day.  Dx code E11.65 102 each 5 Taking  . labetalol (NORMODYNE) 300 MG tablet Take 300 mg by mouth 2 (two) times daily.   08/19/2014 at 1900  . nystatin (MYCOSTATIN) 100000 UNIT/ML suspension Take 5 mLs (500,000 Units total) by mouth 4 (four) times daily. (Patient not taking: Reported on 08/20/2014) 60 mL 0 Not Taking at Unknown time   Scheduled Meds: . cloNIDine  0.3 mg Oral BID  . clopidogrel  75 mg Oral Daily  . digoxin  0.25 mg Oral Daily  . gabapentin  300 mg Oral q morning - 10a  . heparin  5,000 Units Subcutaneous 3 times per day  . hydrALAZINE  100 mg Oral TID  . insulin aspart  0-15 Units Subcutaneous TID WC  . labetalol  300 mg Oral BID  . levofloxacin (LEVAQUIN) IV  750 mg Intravenous Q48H  . pravastatin  40 mg Oral Daily  . sodium chloride  3 mL Intravenous Q12H   Continuous Infusions: . sodium chloride 100 mL/hr at 08/22/14 1518   PRN Meds:.labetalol, ondansetron **OR** ondansetron (ZOFRAN) IV Assessment/Plan: Active Problems:   Hypertensive urgency   CAP (community acquired pneumonia)  CAP:CXR showed patchy infiltrate in the right upper and lower lung, worse in the right lung base. Patient is improving on antibiotics and states shortness of breat improved. Patient is  satting well on room air. We will transfer him out of SDU to med/surg floor. Patient previously on Ceftriaxone and Azithromycin and will transition to Levaquin today. -Levaquin 750 mg Q48H -CBC/BMET tomorrow  am -Supplemental oxygen prn -Carb Modified Diet  Normocytic Anemia: Hemoglobin has trended 10.2>9.9>7.7. Patient admits to dark stools this morning. Ceftriaxone can cause hemolytic anemia so we will complete work up for this. We stopped ceftriaxone and transitioned to Levaquin. Patient had a colonoscopy in 2009 when 7 polyps were removed. He has 2 sisters who had colon cancer. LDH normal at 221, Reticulocyte count 1.1.  -Repeat CBC tomorrow am -Haptoglobin pending -FOBT  Hypertension: BP improved to 130-160s/80s overnight. Patient is on home clonidine, hydralazine, and labetalol. We will continue to hold losartan and HCTZ as creatinine increased today. -Continue clonidine 0.3 mg BID -Continue hydralazine 100 mg TID -Continue Labetalol 300 mg BID -Hold losartan and hctz in setting of AKI  AKI in setting of CKD: BUN/Cr 42/2.53 > 41/2.38>52/2.86 this morning. Patient does not have a clear baseline as BUN/Cr has been increasing over the past several years. Most recent baseline appears to be around 2.25. Patient received fluids for 24 hours yesterday and was not orthostatic today. He is eating and drinking well. -Repeat BMET tomorrow am  T2DM: Last HgbA1c 11.0 on 04/25/14. Patient is on SSI and Lantus 25 units at home.  -SSI-Moderate -CBG Q4H  -Carb Modified Diet  DVT/PE ppx: Heparin SQ TID  Dispo: Disposition is deferred at this time, awaiting improvement of current medical problems.  Anticipated discharge in approximately 1-2 day(s).   The patient does have a current PCP Axel Filler, MD) and does need an [facility] hospital follow-up appointment after discharge.  The patient does not have transportation limitations that hinder transportation to clinic appointments.  .Services Needed at time of discharge: Y = Yes, Blank = No PT:   OT:   RN:   Equipment:   Other:     LOS: 2 days   Osa Craver, DO PGY-1 Internal Medicine Resident Pager # [phone] 08/22/2014 3:46 PM

## 2014-08-22 NOTE — Plan of Care (Signed)
Problem: Phase I Progression Outcomes Goal: OOB as tolerated unless otherwise ordered Outcome: Completed/Met Date Met:  08/22/14 Up in chair after transfer to 6N

## 2014-08-22 NOTE — Progress Notes (Signed)
ANTIBIOTIC CONSULT NOTE - INITIAL  Pharmacy Consult for Levaquin Indication: pneumonia  Allergies  Allergen Reactions  . Amlodipine Swelling  . Lisinopril Cough    Patient Measurements: Height: 6' (182.9 cm) Weight: 214 lb 11.7 oz (97.4 kg) IBW/kg (Calculated) : 77.6 Adjusted Body Weight:    Vital Signs: Temp: 98.2 F (36.8 C) (01/08 0800) Temp Source: Oral (01/08 0800) BP: 135/69 mmHg (01/08 0400) Pulse Rate: 73 (01/08 0400) Intake/Output from previous day: 01/07 0701 - 01/08 0700 In: 1300 [I.V.:700; IV Piggyback:600] Out: 975 [Urine:975] Intake/Output from this shift:    Labs:  Recent Labs  08/20/14 1229 08/21/14 0009 08/22/14 0400  WBC 9.3 8.8 6.1  HGB 10.2* 9.9* 7.7*  PLT 257 210 230  CREATININE 2.53* 2.38* 2.86*   Estimated Creatinine Clearance: 34.5 mL/min (by C-G formula based on Cr of 2.86). No results for input(s): VANCOTROUGH, VANCOPEAK, VANCORANDOM, GENTTROUGH, GENTPEAK, GENTRANDOM, TOBRATROUGH, TOBRAPEAK, TOBRARND, AMIKACINPEAK, AMIKACINTROU, AMIKACIN in the last 72 hours.   Microbiology: Recent Results (from the past 720 hour(s))  Culture, blood (routine x 2)     Status: None (Preliminary result)   Collection Time: 08/20/14  7:00 AM  Result Value Ref Range Status   Specimen Description BLOOD RIGHT ARM  Final   Special Requests   Final    BOTTLES DRAWN AEROBIC AND ANAEROBIC 10CC AER,5CC ANA   Culture   Final           BLOOD CULTURE RECEIVED NO GROWTH TO DATE CULTURE WILL BE HELD FOR 5 DAYS BEFORE ISSUING A FINAL NEGATIVE REPORT Performed at [facility]    Report Status PENDING  Incomplete  MRSA PCR Screening     Status: None   Collection Time: 08/20/14  1:19 PM  Result Value Ref Range Status   MRSA by PCR NEGATIVE NEGATIVE Final    Comment:        The GeneXpert MRSA Assay (FDA approved for NASAL specimens only), is one component of a comprehensive MRSA colonization surveillance program. It is not intended to diagnose  MRSA infection nor to guide or monitor treatment for MRSA infections.   Culture, blood (routine x 2)     Status: None (Preliminary result)   Collection Time: 08/20/14  6:55 PM  Result Value Ref Range Status   Specimen Description BLOOD LEFT ARM  Final   Special Requests BOTTLES DRAWN AEROBIC ONLY 2CC  Final   Culture   Final           BLOOD CULTURE RECEIVED NO GROWTH TO DATE CULTURE WILL BE HELD FOR 5 DAYS BEFORE ISSUING A FINAL NEGATIVE REPORT Performed at [facility]    Report Status PENDING  Incomplete    Medical History: Past Medical History  Diagnosis Date  . Dermatitis   . CHF (congestive heart failure)     LV function improved from 2004 to 2008.  Historically, moderately dilated LV with EF 30-40% by 2D echo 08/14/2002.  Mild CAD with severe LV dysfunction by cardiac cath 09/2002.  Normal coronary arteries and normal LV function by cardiac cath 09/19/2006.  A 2-D echo on 04/01/2009 showed mild concentric hypertrophy and normal systolic (LVEF  123456) and doppler C/W with grade 1 diastolic dysfunction.  . Hypertension   . Hyperlipidemia   . Hearing loss in right ear   . Cardiomyopathy     LV function improved from 2004 to 2008.  Historically, moderately dilated LV with EF 30-40% by 2D echo 08/14/2002.  Mild CAD with severe LV dysfunction by  cardiac cath 09/2002.  Normal coronary arteries and normal LV function by cardiac cath 09/19/2006.  A 2-D echo on 04/01/2009 showed mild concentric hypertrophy and normal systolic (LVEF  123456) and doppler C/W with grade 1 diastolic dysfunction.  . DM neuropathy, painful   . CVA (cerebral vascular accident) 07/04/2012    MRI of the brain 07/04/2012 showed an acute infarct in the right basal ganglia involving the anterior putamen, anterior limb internal capsule, and head of the caudate; this measured approximately 2.5 cm in diameter.     . Hypertensive crisis 07/28/2012  . Myocardial infarction   . Diabetes mellitus     type 2    Assessment: 59 y/o M presented with SOB, cough, headache, and abdominal pain in hypertensive urgency. CXR=CAP.   ID: Tmax 99.9. Currently afebrile. WBC 6.1. Scr 2.86 (up) with CrCl 34. Rocephin 1/7>>1/8 Levaquin 1/8>>  Cards: Presented with hypertensive urgency. Currently 164/88 withr HR 74 on clonidine, Plavix, digoxin, hydralazine, labetolol, Pravachol  DM: HgbA1c 11.  Renal: Acute on CKD.   Goal of Therapy:  Resolution of PNA  Plan:  Change Rocephin to Levaquin 750mg  IV q48h.   Charles Hart S. Alford Highland, PharmD, BCPS Clinical Staff Pharmacist Pager [phone]  Eilene Ghazi Stillinger 08/22/2014,12:22 PM

## 2014-08-23 DIAGNOSIS — N183 Chronic kidney disease, stage 3 (moderate): Secondary | ICD-10-CM

## 2014-08-23 DIAGNOSIS — E1122 Type 2 diabetes mellitus with diabetic chronic kidney disease: Secondary | ICD-10-CM

## 2014-08-23 LAB — BASIC METABOLIC PANEL
ANION GAP: 7 (ref 5–15)
BUN: 55 mg/dL — ABNORMAL HIGH (ref 6–23)
CHLORIDE: 101 meq/L (ref 96–112)
CO2: 24 mmol/L (ref 19–32)
CREATININE: 2.58 mg/dL — AB (ref 0.50–1.35)
Calcium: 7.4 mg/dL — ABNORMAL LOW (ref 8.4–10.5)
GFR calc Af Amer: 30 mL/min — ABNORMAL LOW (ref >90)
GFR calc non Af Amer: 26 mL/min — ABNORMAL LOW (ref >90)
Glucose, Bld: 170 mg/dL — ABNORMAL HIGH (ref 70–99)
Potassium: 3.9 mmol/L (ref 3.5–5.1)
SODIUM: 132 mmol/L — AB (ref 135–145)

## 2014-08-23 LAB — CBC
HCT: 23.1 % — ABNORMAL LOW (ref 39.0–52.0)
HEMOGLOBIN: 7.6 g/dL — AB (ref 13.0–17.0)
MCH: 27.4 pg (ref 26.0–34.0)
MCHC: 32.9 g/dL (ref 30.0–36.0)
MCV: 83.4 fL (ref 78.0–100.0)
PLATELETS: 307 10*3/uL (ref 150–400)
RBC: 2.77 MIL/uL — ABNORMAL LOW (ref 4.22–5.81)
RDW: 12.8 % (ref 11.5–15.5)
WBC: 5.5 10*3/uL (ref 4.0–10.5)

## 2014-08-23 LAB — HAPTOGLOBIN: HAPTOGLOBIN: 285 mg/dL — AB (ref 43–212)

## 2014-08-23 LAB — GLUCOSE, CAPILLARY
Glucose-Capillary: 167 mg/dL — ABNORMAL HIGH (ref 70–99)
Glucose-Capillary: 162 mg/dL — ABNORMAL HIGH (ref 70–99)
Glucose-Capillary: 225 mg/dL — ABNORMAL HIGH (ref 70–99)

## 2014-08-23 MED ORDER — LEVOFLOXACIN 750 MG PO TABS: 750.0000 mg | ORAL_TABLET | ORAL | Status: DC

## 2014-08-23 NOTE — Discharge Instructions (Signed)
1. You have a follow up appointment as scheduled:  Arman Filter  On 09/02/2014 10:45 am  [address] [place] [address] [phone]  2. Please take all medications as prescribed.   Take Levaquin 750 mg tomorrow (08/24/14) and Monday (08/26/14) and then stop.   Do not take your HCTZ or Losartan for now. They will likely restart these medicines in the clinic.   3. If you have worsening of your symptoms or new symptoms arise, please call the clinic [phone] or go to the ER immediately if symptoms are severe.  Pneumonia Pneumonia is an infection of the lungs.  CAUSES Pneumonia may be caused by bacteria or a virus. Usually, these infections are caused by breathing infectious particles into the lungs (respiratory tract). SIGNS AND SYMPTOMS   Cough.  Fever.  Chest pain.  Increased rate of breathing.  Wheezing.  Mucus production. DIAGNOSIS  If you have the common symptoms of pneumonia, your health care provider will typically confirm the diagnosis with a chest X-ray. The X-ray will show an abnormality in the lung (pulmonary infiltrate) if you have pneumonia. Other tests of your blood, urine, or sputum may be done to find the specific cause of your pneumonia. Your health care provider may also do tests (blood gases or pulse oximetry) to see how well your lungs are working. TREATMENT  Some forms of pneumonia may be spread to other people when you cough or sneeze. You may be asked to wear a mask before and during your exam. Pneumonia that is caused by bacteria is treated with antibiotic medicine. Pneumonia that is caused by the influenza virus may be treated with an antiviral medicine. Most other viral infections must run their course. These infections will not respond to antibiotics.  HOME CARE INSTRUCTIONS   Cough suppressants may be used if you are losing too much rest. However, coughing protects you by clearing your lungs. You should avoid using cough suppressants if you  can.  Your health care provider may have prescribed medicine if he or she thinks your pneumonia is caused by bacteria or influenza. Finish your medicine even if you start to feel better.  Your health care provider may also prescribe an expectorant. This loosens the mucus to be coughed up.  Take medicines only as directed by your health care provider.  Do not smoke. Smoking is a common cause of bronchitis and can contribute to pneumonia. If you are a smoker and continue to smoke, your cough may last several weeks after your pneumonia has cleared.  A cold steam vaporizer or humidifier in your room or home may help loosen mucus.  Coughing is often worse at night. Sleeping in a semi-upright position in a recliner or using a couple pillows under your head will help with this.  Get rest as you feel it is needed. Your body will usually let you know when you need to rest. PREVENTION A pneumococcal shot (vaccine) is available to prevent a common bacterial cause of pneumonia. This is usually suggested for:  People over 72 years old.  Patients on chemotherapy.  People with chronic lung problems, such as bronchitis or emphysema.  People with immune system problems. If you are over 65 or have a high risk condition, you may receive the pneumococcal vaccine if you have not received it before. In some countries, a routine influenza vaccine is also recommended. This vaccine can help prevent some cases of pneumonia.You may be offered the influenza vaccine as part of your care. If  you smoke, it is time to quit. You may receive instructions on how to stop smoking. Your health care provider can provide medicines and counseling to help you quit. SEEK MEDICAL CARE IF: You have a fever. SEEK IMMEDIATE MEDICAL CARE IF:   Your illness becomes worse. This is especially true if you are elderly or weakened from any other disease.  You cannot control your cough with suppressants and are losing sleep.  You  begin coughing up blood.  You develop pain which is getting worse or is uncontrolled with medicines.  Any of the symptoms which initially brought you in for treatment are getting worse rather than better.  You develop shortness of breath or chest pain. MAKE SURE YOU:   Understand these instructions.  Will watch your condition.  Will get help right away if you are not doing well or get worse. Document Released: 08/01/2005 Document Revised: 12/16/2013 Document Reviewed: 10/21/2010 [facility] Patient Information 2015 [place], [place]. This information is not intended to replace advice given to you by your health care provider. Make sure you discuss any questions you have with your health care provider.

## 2014-08-23 NOTE — Discharge Summary (Signed)
Name: Charles Hart MRN: [name] DOB: August 08, 1956 59 y.o. PCP: Axel Filler, MD  Date of Admission: 08/20/2014  1:07 PM Date of Discharge: 08/24/2014 Attending Physician: Dr. Beryle Beams  Discharge Diagnosis:  Principal Problem:   CAP (community acquired pneumonia) Active Problems:   DM (diabetes mellitus), type 2, uncontrolled, with renal complications   CKD stage 3 due to type 2 diabetes mellitus and HTN   Hypertensive urgency   Normochromic anemia  Discharge Medications:   Medication List    STOP taking these medications        hydrochlorothiazide 25 MG tablet  Commonly known as:  HYDRODIURIL     losartan 50 MG tablet  Commonly known as:  COZAAR     nystatin 100000 UNIT/ML suspension  Commonly known as:  MYCOSTATIN      TAKE these medications        ACCU-CHEK FASTCLIX LANCETS Misc  Check blood sugar as directed up to 3 times a day.  Dx code E11.65     cloNIDine 0.3 MG tablet  Commonly known as:  CATAPRES  Take 0.3 mg by mouth 2 (two) times daily.     clopidogrel 75 MG tablet  Commonly known as:  PLAVIX  Take 75 mg by mouth daily.     digoxin 0.25 MG tablet  Commonly known as:  LANOXIN  Take 0.25 mg by mouth daily.     fluticasone 50 MCG/ACT nasal spray  Commonly known as:  FLONASE  Place 2 sprays into both nostrils daily as needed for allergies or rhinitis.     gabapentin 300 MG capsule  Commonly known as:  NEURONTIN  Take 300 mg by mouth every morning.     guaiFENesin-codeine 100-10 MG/5ML syrup  Commonly known as:  ROBITUSSIN AC  Take 5 mLs by mouth 3 (three) times daily as needed for cough.     hydrALAZINE 50 MG tablet  Commonly known as:  APRESOLINE  Take 100 mg by mouth 3 (three) times daily.     insulin aspart 100 UNIT/ML injection  Commonly known as:  novoLOG  - < 150 take 7 units novolog before the meal  - 151- 200 inject 8 units Novolog  - 201- 250 inject 10 units Novolog  - 251- 300 inject 11 units  Novolog  - 301 -  350 inject 13 units Novolog  - 351 - 400 inject 14 units Novolog  - 401 - 450 inject 16 units Novolog  - 451 - 500 inject 17 units Novolog  - 501 - 550 inject 19  units Novolog, check blood sugar every 2 hours; if blood sugar does not decrease below 500 then call doctor's office.     Insulin Glargine 100 UNIT/ML Solostar Pen  Commonly known as:  LANTUS  Inject 25 Units into the skin at bedtime.     labetalol 300 MG tablet  Commonly known as:  NORMODYNE  Take 300 mg by mouth 2 (two) times daily.     levofloxacin 750 MG tablet  Commonly known as:  LEVAQUIN  Take 1 tablet (750 mg total) by mouth every other day.     multivitamin with minerals Tabs tablet  Take 1 tablet by mouth every morning.     pravastatin 40 MG tablet  Commonly known as:  PRAVACHOL  Take 40 mg by mouth daily.     sodium chloride 0.65 % Soln nasal spray  Commonly known as:  OCEAN  Place 1 spray into both nostrils as needed for congestion.  Disposition and follow-up:   [name] was discharged from [facility] in Good condition.  At the hospital follow up visit please address:  1.  Hypertensive Urgency: 2/2 to missed medications. Patient managed on clonidine, hydralazine and labetalol. Home medications of losartan and hctz discontinued on discharge. Please assess BP and consider restarting hctz and/or losartan as necessary.  CAP: Please assess resolution of symptoms and completion of antibiotic course.  AKI in setting of CKD: Creatinine 2.53>2.38>2.86>2.58, likely pre-renal as it improved with IVF. Unclear baseline, but about 2.25 in the last year. Please recheck BMET on discharge.   Normocytic Anemia: Hemoglobin trended 10.2>9.9>7.7>7.6. Hemolytic anemia work up was negative. FOBT negative. Please consider rechecking CBC on follow up.   2.  Labs / imaging needed at time of follow-up: BMET, CBC  3.  Pending labs/ test needing follow-up: None  Follow-up  Appointments: Follow-up Information    Follow up with Arman Filter, MD On 09/02/2014.   Specialty:  Internal Medicine   Why:  10:45 am   Contact information:   [place] [id number] [phone]       Discharge Instructions: Discharge Instructions    Increase activity slowly    Complete by:  As directed            Consultations:  None  Procedures Performed:  Ct Head Wo Contrast  08/20/2014   CLINICAL DATA:  RIGHT-sided headache since yesterday, hypertensive urgency, history CHF, hypertension, stroke, MI, diabetes  EXAM: CT HEAD WITHOUT CONTRAST  TECHNIQUE: Contiguous axial images were obtained from the base of the skull through the vertex without intravenous contrast.  COMPARISON:  07/03/2012  FINDINGS: Mild generalized atrophy.  Normal ventricular morphology.  No midline shift or mass effect.  Large old RIGHT basal ganglia lacunar infarct.  No intracranial hemorrhage, mass lesion or evidence acute infarction.  No extra-axial fluid collections.  Bones and sinuses unremarkable.  IMPRESSION: Old large RIGHT basal ganglia lacunar infarct.  No acute intracranial abnormalities.   Electronically Signed   By: Lavonia Dana M.D.   On: 08/20/2014 17:19   Dg Abd Acute W/chest  08/20/2014   CLINICAL DATA:  Abdominal pain for 3 days, cough and shortness of Breath  EXAM: ACUTE ABDOMEN SERIES (ABDOMEN 2 VIEW & CHEST 1 VIEW)  COMPARISON:  01/20/2014  FINDINGS: Cardiac shadow is stable. The lungs are well aerated bilaterally although right basilar infiltrate is seen consistent with acute pneumonia. Some patchy changes are noted within the right upper lobe as well.  Scattered large and small bowel gas is noted. No obstructive changes are seen. No free air is noted. Degenerative changes of the hip joints and lumbar spine are seen. No other focal abnormality is noted.  IMPRESSION: Patchy infiltrate in the right upper and lower lung. This is worse in the right lung base.  Nonspecific abdomen.    Electronically Signed   By: Inez Catalina M.D.   On: 08/20/2014 17:06   Admission HPI: Mr. [name] is a 59 yo male with PMHx of HTN, Cardiomyopathy, CHF, CVA in 2013, OSA, CKD III, T2DM and HLD who presented to the [facility] with complaint of abdominal pain, headache and productive cough. Patient states on 08/17/14 (3 days prior) he developed non-radiating, mid-abdominal pain. He describes the pain as constant, sharp, achey, 8-9/10. Nothing seems to make the pain better or worse. Pain is not associated with food intake, nor does it wax or wane. He tried alkaseltzer without relief. He admits to  fever, chills, shortness of breath with a productive cough, but denies nausea, vomiting, diarrhea, sore throat, nasal congestion or chest pain. Patient admits to infrequent, occasional alcohol use, but denies tobacco abuse. His last bowel movement was 3-4 days ago which is unusual for him, but he admits to decreased appetite and fluid intake. He has taken all of his medications as prescribed and only missing his morning medications for today. His blood glucose has been well controlled at home in the 130s-180s.   Hospital Course by problem list: Principal Problem:   CAP (community acquired pneumonia) Active Problems:   DM (diabetes mellitus), type 2, uncontrolled, with renal complications   CKD stage 3 due to type 2 diabetes mellitus and HTN   Hypertensive urgency   Normochromic anemia   Hypertensive Urgency: BP on admission was 215/111. Patient admitted he had not taken his BP medications since the day prior. He also admitted to right sided headache since the day prior. He denied neck stiffness, photophobia or phonophobia, nausea or vomiting. Hypertensive urgency was likely secondary to missed medications and pain. Patient is on Clonidine 0.3 mg BID, Hydralazine 100 mg TID, HCTZ 25 mg daily, labetalol 300 mg BID and losartan 100 mg daily at home. BP was initially managed with Labetalol IV. Patient was then put back  on his home medications of clonidine 0.3 mg BID, hydralazine 100 mg TID, and Labetalol 300 mg BID. Losartan and hctz were held in setting of AKI. Blood pressure normalized over the next 2 days to 130-150s/70s. Patient was discharged on clonidine, hydralazine and labetalol. Losartan and HCTZ were discontinued. Please assess BP control on follow up and consider restarting HCTZ and/or losartan if necessary.   CAP: Patient admitted to a productive cough of brown sputum for 3-4 days. He admitted to shortness of breath with exertion and at rest with fever with chills. Patient was desaturating to 88% on room air in the SDU and was place on oxygen via nasal cannula. Lactic acid normal at 1.2. CXR and Abdominal Xray showed patchy infiltrate in the right upper and lower lung, worse in the right lung base. Patient was initially managed with Azithromycin 500 mg IV daily and Ceftriaxone 1 gram daily, but later transitioned to Levaquin. Patient's shortness of breath and fever resolved over the course of the next 2 days and patient was satting 98% on room air. Patient was discharged on Levaquin 750 mg Q48H until 08/26/14. On follow up, please assess resolution of symptoms and completion of antibiotics.   Normocytic Anemia: Hemoglobin trended 10.2>9.9>7.7>7.6. Ceftriaxone can cause hemolytic anemia so we stopped ceftriaxone and transitioned to Levaquin. Hemolytic anemia work up was negative. LDH normal at 221, Reticulocyte count 1.1, haptoglobin elevated. Patient had a colonoscopy in 2009 when 7 polyps were removed. He has 2 sisters who had colon cancer. FOBT negative. Please consider rechecking CBC on follow up.   AKI in setting of CKD: Creatinine 2.53>2.38>2.86>2.58, likely pre-renal as it improved with IVF. Unclear baseline, but about 2.25 in the last year. Please recheck BMET on discharge.   H/o of CVA in 2013: Patient is on plavix 75 mg daily at home. He admitted to headache, but denied focal deficits, numbness,  changes in vision or hearing. CT Head showed Old large right basal ganglia lacunar infarct, but no acute intracranial abnormalities. We continued plavix 75 mg daily during admission and on discharge.  Cardiomyopathy: Patient denied chest pain. He is on Plavix 75 mg daily, digoxin 0.25 mg daily, losartan, labetalol, HCTZ, hydralazine, and  clonidine as listed above. We held hctz and losartan, but continued home medications as listed.  T2DM: Last HgbA1c 11.0 on 04/25/14. Patient is on SSI and Lantus 25 units at home. Patient was managed on SSI-Moderate. We resumed Lantus on discharge.  HLD: Cholesterol panel showed total cholesterol of 189, TG 183, HDL 37, LDL 115. Patient is on pravastatin 40 mg daily at home. We continued pravastatin 40 mg daily during admission and on discharge.  Discharge Vitals:   BP 147/75 mmHg  Pulse 64  Temp(Src) 98.8 F (37.1 C) (Oral)  Resp 17  Ht 6' (1.829 m)  Wt 97.523 kg (215 lb)  BMI 29.15 kg/m2  SpO2 98%  Signed: Osa Craver, DO PGY-1 Internal Medicine Resident Pager # [phone] 08/24/2014 9:24 PM

## 2014-08-23 NOTE — Progress Notes (Addendum)
Subjective: Patient states that he is doing well and breathing well. He is reporting no complaints this morning and states that he is ready to go home. Denies chest pain, abodminal pain, nausea, headaches, or vomiting. He is tolerating food well.   Objective: Vital signs in last 24 hours: Filed Vitals:   08/22/14 1600 08/22/14 1720 08/22/14 2132 08/23/14 0520  BP: 134/63 155/73 156/67 152/69  Pulse: 74 74 78 77  Temp: 98.6 F (37 C) 98.7 F (37.1 C) 97.5 F (36.4 C) 98.8 F (37.1 C)  TempSrc: Oral Oral Oral Oral  Resp: 15 17 18 19   Height:      Weight:    97.523 kg (215 lb)  SpO2: 93% 96% 96% 98%   Weight change: 0.123 kg (4.4 oz)  Intake/Output Summary (Last 24 hours) at 08/23/14 1049 Last data filed at 08/23/14 0933  Gross per 24 hour  Intake   1913 ml  Output    700 ml  Net   1213 ml   General: Vital signs reviewed. Patient is in no acute distress and cooperative with exam.  Cardiovascular: Regular rate, regular rhythm, S1 normal, S2 normal Pulmonary/Chest: Scattered rhonchi, otherwise, no wheezes. Abdominal: Soft, non-tender to palpation, normoactive BS, non-distended Extremities: No lower extremity edema bilaterally Skin: Warm, dry and intact. No rashes or erythema. Psychiatric: Normal mood and affect. speech and behavior is normal. Cognition and memory are normal.   Lab Results: Basic Metabolic Panel:  Recent Labs Lab 08/22/14 0400 08/23/14 0357  NA 134* 132*  K 4.2 3.9  CL 102 101  CO2 27 24  GLUCOSE 157* 170*  BUN 52* 55*  CREATININE 2.86* 2.58*  CALCIUM 7.2* 7.4*   Liver Function Tests:  Recent Labs Lab 08/20/14 1229  AST 26  ALT 15  ALKPHOS 59  BILITOT 1.0  PROT 7.0  ALBUMIN 3.1*    Recent Labs Lab 08/20/14 1229  LIPASE 43   CBC:  Recent Labs Lab 08/20/14 1229  08/22/14 0400 08/23/14 0357  WBC 9.3  < > 6.1 5.5  NEUTROABS 7.5  --   --   --   HGB 10.2*  < > 7.7* 7.6*  HCT 31.0*  < > 23.1* 23.1*  MCV 80.7  < > 84.9 83.4    PLT 257  < > 230 307  < > = values in this interval not displayed. Cardiac Enzymes:  Recent Labs Lab 08/20/14 1229 08/20/14 2020 08/21/14 0009  TROPONINI 0.11* 0.11* 0.09*   CBG:  Recent Labs Lab 08/22/14 0801 08/22/14 1230 08/22/14 1535 08/22/14 2058 08/23/14 0519 08/23/14 0755  GLUCAP 156* 167* 206* 157* 167* 162*   Fasting Lipid Panel:  Recent Labs Lab 08/20/14 1229  CHOL 189  HDL 37*  LDLCALC 115*  TRIG 183*  CHOLHDL 5.1   Urine Drug Screen: Drugs of Abuse     Component Value Date/Time   LABOPIA NONE DETECTED 07/03/2012 2346   COCAINSCRNUR NONE DETECTED 07/03/2012 2346   LABBENZ NONE DETECTED 07/03/2012 2346   AMPHETMU NONE DETECTED 07/03/2012 2346   THCU NONE DETECTED 07/03/2012 2346   LABBARB NONE DETECTED 07/03/2012 2346    Urinalysis:  Recent Labs Lab 08/20/14 1300 08/20/14 1520  COLORURINE YELLOW YELLOW  LABSPEC 1.017 1.018  PHURINE 6.0 6.0  GLUCOSEU 250* 100*  HGBUR LARGE* MODERATE*  BILIRUBINUR NEGATIVE NEGATIVE  KETONESUR NEGATIVE NEGATIVE  PROTEINUR >300* >300*  UROBILINOGEN 2.0* 2.0*  NITRITE NEGATIVE NEGATIVE  LEUKOCYTESUR NEGATIVE NEGATIVE    Micro Results: Recent Results (from the  past 240 hour(s))  Culture, blood (routine x 2)     Status: None (Preliminary result)   Collection Time: 08/20/14  7:00 AM  Result Value Ref Range Status   Specimen Description BLOOD RIGHT ARM  Final   Special Requests   Final    BOTTLES DRAWN AEROBIC AND ANAEROBIC 10CC AER,5CC ANA   Culture   Final           BLOOD CULTURE RECEIVED NO GROWTH TO DATE CULTURE WILL BE HELD FOR 5 DAYS BEFORE ISSUING A FINAL NEGATIVE REPORT Performed at [facility]    Report Status PENDING  Incomplete  MRSA PCR Screening     Status: None   Collection Time: 08/20/14  1:19 PM  Result Value Ref Range Status   MRSA by PCR NEGATIVE NEGATIVE Final    Comment:        The GeneXpert MRSA Assay (FDA approved for NASAL specimens only), is one component of  a comprehensive MRSA colonization surveillance program. It is not intended to diagnose MRSA infection nor to guide or monitor treatment for MRSA infections.   Culture, blood (routine x 2)     Status: None (Preliminary result)   Collection Time: 08/20/14  6:55 PM  Result Value Ref Range Status   Specimen Description BLOOD LEFT ARM  Final   Special Requests BOTTLES DRAWN AEROBIC ONLY 2CC  Final   Culture   Final           BLOOD CULTURE RECEIVED NO GROWTH TO DATE CULTURE WILL BE HELD FOR 5 DAYS BEFORE ISSUING A FINAL NEGATIVE REPORT Performed at [facility]    Report Status PENDING  Incomplete   Medications:  I have reviewed the patient's current medications. Prior to Admission:  Prescriptions prior to admission  Medication Sig Dispense Refill Last Dose  . cloNIDine (CATAPRES) 0.3 MG tablet Take 0.3 mg by mouth 2 (two) times daily.   Past Week at Unknown time  . clopidogrel (PLAVIX) 75 MG tablet Take 75 mg by mouth daily.   Past Week at Unknown time  . digoxin (LANOXIN) 0.25 MG tablet Take 0.25 mg by mouth daily.   Past Week at Unknown time  . fluticasone (FLONASE) 50 MCG/ACT nasal spray Place 2 sprays into both nostrils daily as needed for allergies or rhinitis.   Past Week at Unknown time  . gabapentin (NEURONTIN) 300 MG capsule Take 300 mg by mouth every morning.   Past Week at Unknown time  . guaiFENesin-codeine (ROBITUSSIN AC) 100-10 MG/5ML syrup Take 5 mLs by mouth 3 (three) times daily as needed for cough. 120 mL 0 Past Week at Unknown time  . hydrALAZINE (APRESOLINE) 50 MG tablet Take 100 mg by mouth 3 (three) times daily.   Past Week at Unknown time  . hydrochlorothiazide (HYDRODIURIL) 25 MG tablet Take 25 mg by mouth daily.   Past Week at Unknown time  . insulin aspart (NOVOLOG) 100 UNIT/ML injection < 150 take 7 units novolog before the meal 151- 200 inject 8 units Novolog 201- 250 inject 10 units Novolog 251- 300 inject 11 units  Novolog 301 - 350 inject 13 units  Novolog 351 - 400 inject 14 units Novolog 401 - 450 inject 16 units Novolog 451 - 500 inject 17 units Novolog 501 - 550 inject 19  units Novolog, check blood sugar every 2 hours; if blood sugar does not decrease below 500 then call doctor's office. 10 mL 0 Past Week at Unknown time  . Insulin Glargine (LANTUS)  100 UNIT/ML Solostar Pen Inject 25 Units into the skin at bedtime. 15 mL 1 Past Week at Unknown time  . losartan (COZAAR) 50 MG tablet Take 100 mg by mouth daily.   Past Week at Unknown time  . Multiple Vitamin (MULITIVITAMIN WITH MINERALS) TABS Take 1 tablet by mouth every morning.    Past Week at Unknown time  . pravastatin (PRAVACHOL) 40 MG tablet Take 40 mg by mouth daily.   Past Week at Unknown time  . sodium chloride (OCEAN) 0.65 % SOLN nasal spray Place 1 spray into both nostrils as needed for congestion. 1 Bottle 0 Past Week at Unknown time  . ACCU-CHEK FASTCLIX LANCETS MISC Check blood sugar as directed up to 3 times a day.  Dx code E11.65 102 each 5 Taking  . labetalol (NORMODYNE) 300 MG tablet Take 300 mg by mouth 2 (two) times daily.   08/19/2014 at 1900  . nystatin (MYCOSTATIN) 100000 UNIT/ML suspension Take 5 mLs (500,000 Units total) by mouth 4 (four) times daily. (Patient not taking: Reported on 08/20/2014) 60 mL 0 Not Taking at Unknown time   Scheduled Meds: . cloNIDine  0.3 mg Oral BID  . clopidogrel  75 mg Oral Daily  . digoxin  0.25 mg Oral Daily  . gabapentin  300 mg Oral q morning - 10a  . heparin  5,000 Units Subcutaneous 3 times per day  . hydrALAZINE  100 mg Oral TID  . insulin aspart  0-15 Units Subcutaneous TID WC  . labetalol  300 mg Oral BID  . levofloxacin (LEVAQUIN) IV  750 mg Intravenous Q48H  . pravastatin  40 mg Oral Daily  . sodium chloride  3 mL Intravenous Q12H   Continuous Infusions:   PRN Meds:.ondansetron **OR** ondansetron (ZOFRAN) IV   Assessment/Plan: Charles Hart is a 59 y.o. male w/ PMHx of HTN, CKD, CHF, and DM type II, admitted  w/ CAP and hypertensive urgency.   [id number] significantly improved, lung exam also improved from yesterday. Transitioned to Levaquin po yesterday.  -Continue Levaquin 750 mg Q48H until 08/26/13 -Carb Modified Diet  Normocytic Anemia: Hb stable at 7.6 this AM. No signs of bleeding. LDH normal at 221, Reticulocyte count 1.1, Haptoglobin elevated. FOBT negative. Suspect this may be associated w/ CKD at this time. Will need close follow up in the [facility] clinic.  -Repeat CBC in 1 week in clinic  Hypertension: BP stable in the 150/60 range.  -Continue clonidine 0.3 mg BID -Continue hydralazine 100 mg TID -Continue Labetalol 300 mg BID -Can restart HCTZ and Losartan on follow up in clinic.   AKI in setting of CKD: Cr improved to 2.58 this AM. Suspect this may be pre-renal, improved after IVF yesterday.  -Repeat BMET in clinic  T2DM: Last HgbA1c 11.0 on 04/25/14. Patient is on SSI and Lantus 25 units at home.  -Restart home lantus on discharge.   DVT/PE ppx: Heparin SQ TID  Dispo: Anticipated discharge today.   The patient does have a current PCP Charles Filler, MD) and does need an [facility] hospital follow-up appointment after discharge.  The patient does not have transportation limitations that hinder transportation to clinic appointments.  .Services Needed at time of discharge: Y = Yes, Blank = No PT:   OT:   RN:   Equipment:   Other:     LOS: 3 days   Signed: Luanne Bras, MD 08/23/2014 12:32 PM

## 2014-08-23 NOTE — Progress Notes (Signed)
Patient ID: Charles Hart, male   DOB: [birth year], 59 y.o.   MRN: [name] Medicine attending discharge note: I personally interviewed and examined this patient and reviewed the discharge plan and attest to the accuracy of the evaluation and management plan recorded by resident physician Dr. Luanne Bras.  Clinical summary: 59 year old man with coronary artery disease with previous MI, refractory essential hypertension, hyperlipidemia, cerebrovascular disease status post CVA 2013, on multiple antihypertensive medications at maximal doses, type 2 diabetes, moderate chronic renal insufficiency, who presented with the following complaints: He has had a productive cough with associated fevers, chills, and dyspnea. Positive headache. He has had acute mid abdominal pain without any nausea vomiting or diarrhea. He has been constipated with no bowel movement times 3-4 days. He was evaluated in our outpatient clinic. Blood pressure found to be markedly elevated at 215/111. Repeat value on admission 232/114. Respirations 34. Oxygen saturation 92% on room air. Pulse 108 and regular. Temperature 99.6.  On initial exam, he was tachycardic without any murmur gallop or rub. Decreased breath sounds bilaterally but no wheezes rales or rhonchi. Mid abdominal tenderness with guarding. No peripheral edema.  Electrocardiogram showed sinus rhythm with left ventricular hypertrophy, nonspecific ST and T-wave changes, no acute ischemic changes. Troponins mildly elevated but did not elevate. A chest radiograph showed patchy infiltrates in the right upper and right lower lungs. Abdominal x-ray showed no obstruction or free air.  Hospital course: He was given an initial dose of parenteral labetalol. Antihypertensives were adjusted. Blood pressure at time of discharge in the range of 147-156/67-75 He was started on treatment for a community-acquired pneumonia. Temperature normalized within 24 hours and he remained afebrile  for the duration of the hospital course. His headache resolved with better blood pressure control. His abdominal pain resolved spontaneously.  Additional problem identified during his admission was an unexplained fall in his hemoglobin. Admission hemoglobin 10.2 which appeared to be his baseline compared with a value of 10.6 on 05/18/2014. Hemoglobin fell to 7.7 on January 8 and was 7.6 at discharge January 9. White count and platelets were normal.  He reported dark stools but guaic testing was negative. There was no laboratory evidence for hemolysis. Reticulocyte count 1.1%. Bilirubin 1.0. LDH 221. Haptoglobin 285. He did not require a transfusion. Only new medications were antibiotics. To exclude antibiotic reaction as etiology of his anemia, his cephalosporin was discontinued. He was put on oral Levaquin to complete a planned course of antibiotics as an outpatient. He was discharged in stable condition.

## 2014-08-23 NOTE — Progress Notes (Signed)
IVs removed per order. Discharge instructions given with teachback. Discharged via wheelchair to wife's care with NT present. Melford Aase, RN

## 2014-08-27 LAB — CULTURE, BLOOD (ROUTINE X 2)
Culture: NO GROWTH
Culture: NO GROWTH

## 2014-09-02 ENCOUNTER — Encounter: Payer: Self-pay | Admitting: Internal Medicine

## 2014-09-02 ENCOUNTER — Ambulatory Visit (HOSPITAL_COMMUNITY)
Admission: RE | Admit: 2014-09-02 | Discharge: 2014-09-02 | Disposition: A | Discharge status: Home / Self Care | Payer: Medicare Other | Source: Ambulatory Visit | Attending: Internal Medicine | Admitting: Internal Medicine

## 2014-09-02 ENCOUNTER — Ambulatory Visit (INDEPENDENT_AMBULATORY_CARE_PROVIDER_SITE_OTHER): Payer: Medicare Other | Admitting: Internal Medicine

## 2014-09-02 VITALS — BP 171/83 | HR 70 | Temp 98.0°F | Wt 217.9 lb

## 2014-09-02 DIAGNOSIS — J189 Pneumonia, unspecified organism: Secondary | ICD-10-CM | POA: Diagnosis not present

## 2014-09-02 DIAGNOSIS — I509 Heart failure, unspecified: Secondary | ICD-10-CM | POA: Insufficient documentation

## 2014-09-02 DIAGNOSIS — D649 Anemia, unspecified: Secondary | ICD-10-CM | POA: Diagnosis not present

## 2014-09-02 DIAGNOSIS — R0602 Shortness of breath: Secondary | ICD-10-CM | POA: Diagnosis not present

## 2014-09-02 DIAGNOSIS — E1129 Type 2 diabetes mellitus with other diabetic kidney complication: Secondary | ICD-10-CM

## 2014-09-02 DIAGNOSIS — E1165 Type 2 diabetes mellitus with hyperglycemia: Secondary | ICD-10-CM | POA: Diagnosis not present

## 2014-09-02 DIAGNOSIS — I10 Essential (primary) hypertension: Secondary | ICD-10-CM | POA: Diagnosis not present

## 2014-09-02 DIAGNOSIS — I160 Hypertensive urgency: Secondary | ICD-10-CM

## 2014-09-02 DIAGNOSIS — J984 Other disorders of lung: Secondary | ICD-10-CM | POA: Diagnosis not present

## 2014-09-02 DIAGNOSIS — IMO0002 Reserved for concepts with insufficient information to code with codable children: Secondary | ICD-10-CM

## 2014-09-02 LAB — BRAIN NATRIURETIC PEPTIDE: B Natriuretic Peptide: 1186.5 pg/mL — ABNORMAL HIGH (ref 0.0–100.0)

## 2014-09-02 LAB — BASIC METABOLIC PANEL
Anion gap: 8 (ref 5–15)
BUN: 30 mg/dL — ABNORMAL HIGH (ref 6–23)
CO2: 26 mmol/L (ref 19–32)
Calcium: 8.9 mg/dL (ref 8.4–10.5)
Chloride: 107 mEq/L (ref 96–112)
Creatinine, Ser: 2.02 mg/dL — ABNORMAL HIGH (ref 0.50–1.35)
GFR calc Af Amer: 40 mL/min — ABNORMAL LOW (ref >90)
GFR, EST NON AFRICAN AMERICAN: 35 mL/min — AB (ref >90)
Glucose, Bld: 123 mg/dL — ABNORMAL HIGH (ref 70–99)
Potassium: 3.7 mmol/L (ref 3.5–5.1)
Sodium: 141 mmol/L (ref 135–145)

## 2014-09-02 LAB — CBC WITH DIFFERENTIAL/PLATELET
BASOS ABS: 0 10*3/uL (ref 0.0–0.1)
BASOS PCT: 0 % (ref 0–1)
EOS PCT: 1 % (ref 0–5)
Eosinophils Absolute: 0.1 10*3/uL (ref 0.0–0.7)
HCT: 28.7 % — ABNORMAL LOW (ref 39.0–52.0)
Hemoglobin: 9.2 g/dL — ABNORMAL LOW (ref 13.0–17.0)
LYMPHS PCT: 17 % (ref 12–46)
Lymphs Abs: 1.2 10*3/uL (ref 0.7–4.0)
MCH: 26.7 pg (ref 26.0–34.0)
MCHC: 32.1 g/dL (ref 30.0–36.0)
MCV: 83.2 fL (ref 78.0–100.0)
Monocytes Absolute: 0.4 10*3/uL (ref 0.1–1.0)
Monocytes Relative: 6 % (ref 3–12)
NEUTROS PCT: 76 % (ref 43–77)
Neutro Abs: 5.5 10*3/uL (ref 1.7–7.7)
PLATELETS: 432 10*3/uL — AB (ref 150–400)
RBC: 3.45 MIL/uL — ABNORMAL LOW (ref 4.22–5.81)
RDW: 13.8 % (ref 11.5–15.5)
WBC: 7.3 10*3/uL (ref 4.0–10.5)

## 2014-09-02 LAB — GLUCOSE, CAPILLARY: Glucose-Capillary: 127 mg/dL — ABNORMAL HIGH (ref 70–99)

## 2014-09-02 LAB — TROPONIN I: Troponin I: 0.04 ng/mL — ABNORMAL HIGH (ref <0.031)

## 2014-09-02 LAB — POCT GLYCOSYLATED HEMOGLOBIN (HGB A1C): HEMOGLOBIN A1C: 6.4

## 2014-09-02 MED ORDER — CLONIDINE HCL 0.3 MG PO TABS: 0.3000 mg | ORAL_TABLET | Freq: Two times a day (BID) | ORAL | Status: DC

## 2014-09-02 MED ORDER — HYDROCHLOROTHIAZIDE 25 MG PO TABS: 25.0000 mg | ORAL_TABLET | Freq: Every day | ORAL | Status: DC

## 2014-09-02 MED ORDER — LOSARTAN POTASSIUM 100 MG PO TABS: 100.0000 mg | ORAL_TABLET | Freq: Every day | ORAL | Status: DC

## 2014-09-02 NOTE — Assessment & Plan Note (Addendum)
Lab Results  Component Value Date   HGBA1C 6.4 09/02/2014   HGBA1C 11.0 04/25/2014   HGBA1C 9.3 01/24/2014     Assessment: Diabetes control: good control (HgbA1C at goal) Progress toward A1C goal:  at goal Comments: Significant improvement.  Blood sugars 76.7% in range on glucometer.  A1C potentially affected by recent acute anemia during hospitalization with decreased RBC lifespan.  Plan: Medications:  continue current medications: Lantus 25 units at bedtime, Novolog sliding scale with meals. Home glucose monitoring: Frequency: 4 times a day Timing: before meals, at bedtime

## 2014-09-02 NOTE — Assessment & Plan Note (Addendum)
Hemoglobin & Hematocrit     Component Value Date/Time   HGB 9.2* 09/02/2014 1235   HCT 28.7* 09/02/2014 1235   Hgb improved from 7.6 at discharge.  Hemolytic anemia work-up negative with no obvious signs of bleeding during hospitalization or now.  Etiology unclear. -Re-check CBC at next follow-up.

## 2014-09-02 NOTE — Patient Instructions (Addendum)
Thank you for coming to clinic today Mr. Rando.  General instructions: -Your pneumonia appears to be improving, but let us know if your cough gets worse or you develop fevers and chills. -I think your shortness of breath is because your blood pressure got too high. -I sent prescriptions for you to restart your losartan and HCTZ to your pharmacy. -If your shortness of breath gets worse, or you develop a headache, blood in your urine, or worsening symptoms, make sure to call our clinic and come back to see [name]. -Please make a follow up appointment to return to clinic in 1 week.  Thank you for bringing your medicines today. This helps [name] keep you safe from mistakes.

## 2014-09-02 NOTE — Assessment & Plan Note (Signed)
Only mild cough currently, and sputum has cleared up.  He is afebrile today.  SOB had resolved, but returned last night.  This does not appear to be related to his pneumonia. -Repeat chest x-ray.

## 2014-09-02 NOTE — Assessment & Plan Note (Addendum)
BP Readings from Last 3 Encounters:  09/02/14 222/110  08/23/14 147/75  08/20/14 200/100   Blood pressure significantly elevated today, unclear if this is causing his shortness of breath and nausea.  He did not take his medications today, so he is likely experiencing some rebound hypertension related to not taking clonidine.  He has not been taking HCTZ or losartan because these were not needed for blood pressure control in the hospital, and he had some AKI.  He will likely need to restart those medications as well if his kidney function is improved. -Had patient try to take his BP meds in clinic. -Will check BMP for resolution of his AKI. -Will monitor in clinic for a couple of hours until labs come back.

## 2014-09-02 NOTE — Progress Notes (Signed)
   Subjective:    Patient ID: Charles Hart, male    DOB: 09-08-1955, 59 y.o.   MRN: [name]  HPI YAQOOB KAZI is a 59 year old man with history of HTN, CHF, CVA in 2013, OSA, CKD III, DM2, and HLD presenting for hospital follow up.  He was hospitalized from 08/20/14 through 08/23/14 with community acquired pneumonia and hypertensive urgency with BP to 215/111 after not taking his blood pressure medications.  He was discharged on Levaquin ending 08/26/14.  He reports finishing his antibiotics.  He reports feeling okay until last night, when he felt short of breath and nauseated.  He says he threw up twice last night.  He says he wasn't sure if he could make it through the night.  He says that his cough is improved and his sputum has cleared up.  He denies any bleeding, chest pain, or headache.  He is not currently nauseous, but he hasn't eaten anything today.  He didn't take his blood pressure medications today due to his nausea.  He noticed increased swelling of his feet last night, and he took some of his old furosemide, which helped with his swelling.  He does report orthopnea, and he uses 3 pillows at night.   Review of Systems  Constitutional: Negative for fever, chills and activity change.  HENT: Negative for congestion, rhinorrhea and sore throat.   Respiratory: Positive for cough and shortness of breath. Negative for chest tightness.   Cardiovascular: Positive for leg swelling. Negative for chest pain.  Gastrointestinal: Negative for nausea, vomiting, abdominal pain, diarrhea, constipation and blood in stool.  Genitourinary: Negative for dysuria and hematuria.  Musculoskeletal: Negative for myalgias and arthralgias.  Skin: Negative for rash.  Neurological: Negative for dizziness, weakness, light-headedness and numbness.       Objective:   Physical Exam  Constitutional: He is oriented to person, place, and time. He appears well-developed and well-nourished. No distress.  HENT:    Head: Normocephalic and atraumatic.  Mouth/Throat: No oropharyngeal exudate.  Eyes: Conjunctivae and EOM are normal. Pupils are equal, round, and reactive to light. No scleral icterus.  Cardiovascular: Normal rate, regular rhythm and normal heart sounds.   Pulmonary/Chest: Effort normal. He has wheezes (Mild expiratory.).  Breathing heavily.  Abdominal: Soft. Bowel sounds are normal. He exhibits no distension. There is no tenderness.  Musculoskeletal: Normal range of motion. He exhibits edema (Trace at ankles bilaterally.). He exhibits no tenderness.  Neurological: He is alert and oriented to person, place, and time. No cranial nerve deficit. He exhibits normal muscle tone.  Skin: Skin is warm and dry. No rash noted. No erythema.          Assessment & Plan:  Please see problem-based assessment and plan.

## 2014-09-02 NOTE — Assessment & Plan Note (Addendum)
Worsening shortness of breath and nausea started last night.  I would attribute this to his elevated blood pressure, but he reports compliance and appears to be taking his BP meds on questioning. He does have a history of CAD, so will rule out ACS.   History of heart failure as well with most recent echo in 2014 with normal EF, but grade 2 diastolic dysfunction.  Has had decreased EF in the past and has been on diuretics.  Recently switched to HCTZ, which was held on discharge.  Does complain of some lower extremity swelling, so he may be volume overloaded. -Chest x-ray. -BNP, but may be artificially elevated due to kidney disease. -EKG and troponin. -Will have him take his blood pressure medications, and we will monitor him in clinic for a couple of hours while his labs come back.  --Addendum-- Arman Filter, MD, PhD Internal Medicine Intern Pager: [phone] 09/02/2014,3:27 PM  He was able to tolerate his BP meds by mouth and is no longer nauseous.  His BP improved to 171/83 on repeat measurement, and his shortness of breath is a bit better.  BNP elevated, but may be due to CKD.  Troponin lower than measured in the hospital.  EKG unchanged from prior.  Chest x-ray shows improving pneumonia with some evidence of interstitial pulmonary edema.  His symptoms are most likely secondary to his hypertensive urgency with some component of not being on a diuretic since discharge.  His Cr is back to his baseline, so will restart his prior medications. -Sent refills of clonidine and Coreg to his pharmacy. -Restart losartan 100 mg daily and HCTZ 25 mg daily.  HCTZ will help with pulmonary edema. -Return to clinic in 1 week for reassessment. -Told him to come back if new or worsening symptoms. -Could consider outpatient echo in future, last performed in 03/2013.

## 2014-09-03 NOTE — Addendum Note (Signed)
Addended by: Charlesetta Shanks on: 09/03/2014 08:48 AM   Modules accepted: Miquel Dunn

## 2014-09-03 NOTE — Addendum Note (Signed)
Addended by: Charlesetta Shanks on: 09/03/2014 08:42 AM   Modules accepted: Miquel Dunn

## 2014-09-03 NOTE — Progress Notes (Signed)
Internal Medicine Clinic Attending  I saw and evaluated the patient.  I personally confirmed the key portions of the history and exam documented by Dr. Moding and I reviewed pertinent patient test results.  The assessment, diagnosis, and plan were formulated together and I agree with the documentation in the resident's note. 

## 2014-09-09 ENCOUNTER — Ambulatory Visit: Payer: Medicare Other | Admitting: Internal Medicine

## 2014-09-15 ENCOUNTER — Ambulatory Visit (INDEPENDENT_AMBULATORY_CARE_PROVIDER_SITE_OTHER): Payer: Medicare Other | Admitting: Internal Medicine

## 2014-09-15 VITALS — BP 137/62 | HR 66 | Temp 98.0°F | Ht 73.0 in | Wt 215.4 lb

## 2014-09-15 DIAGNOSIS — I10 Essential (primary) hypertension: Secondary | ICD-10-CM

## 2014-09-15 NOTE — Progress Notes (Signed)
Subjective:   Patient ID: Charles Hart male   DOB: [birth year] 59 y.o.   MRN: [id number]  HPI: [name] is a 59 y.o. man with a past medical history as listed below presents for follow-up regarding his blood pressure. He was noted to have elevated BP on his last visit 09/02/14 given his not taking his meds that day. Since that time the patient has reported compliance.   Past Medical History  Diagnosis Date  . Dermatitis   . CHF (congestive heart failure)     LV function improved from 2004 to 2008.  Historically, moderately dilated LV with EF 30-40% by 2D echo 08/14/2002.  Mild CAD with severe LV dysfunction by cardiac cath 09/2002.  Normal coronary arteries and normal LV function by cardiac cath 09/19/2006.  A 2-D echo on 04/01/2009 showed mild concentric hypertrophy and normal systolic (LVEF  123456) and doppler C/W with grade 1 diastolic dysfunction.  . Hypertension   . Hyperlipidemia   . Hearing loss in right ear   . Cardiomyopathy     LV function improved from 2004 to 2008.  Historically, moderately dilated LV with EF 30-40% by 2D echo 08/14/2002.  Mild CAD with severe LV dysfunction by cardiac cath 09/2002.  Normal coronary arteries and normal LV function by cardiac cath 09/19/2006.  A 2-D echo on 04/01/2009 showed mild concentric hypertrophy and normal systolic (LVEF  123456) and doppler C/W with grade 1 diastolic dysfunction.  . DM neuropathy, painful   . CVA (cerebral vascular accident) 07/04/2012    MRI of the brain 07/04/2012 showed an acute infarct in the right basal ganglia involving the anterior putamen, anterior limb internal capsule, and head of the caudate; this measured approximately 2.5 cm in diameter.     . Hypertensive crisis 07/28/2012  . Myocardial infarction   . Diabetes mellitus     type 2   Current Outpatient Prescriptions  Medication Sig Dispense Refill  . ACCU-CHEK FASTCLIX LANCETS MISC Check blood sugar as directed up to 3 times a day.  Dx code E11.65  102 each 5  . cloNIDine (CATAPRES) 0.3 MG tablet Take 1 tablet (0.3 mg total) by mouth 2 (two) times daily. 60 tablet 11  . clopidogrel (PLAVIX) 75 MG tablet Take 75 mg by mouth daily.    . digoxin (LANOXIN) 0.25 MG tablet Take 0.25 mg by mouth daily.    . fluticasone (FLONASE) 50 MCG/ACT nasal spray Place 2 sprays into both nostrils daily as needed for allergies or rhinitis.    [name] gabapentin (NEURONTIN) 300 MG capsule Take 300 mg by mouth every morning.    [name] guaiFENesin-codeine (ROBITUSSIN AC) 100-10 MG/5ML syrup Take 5 mLs by mouth 3 (three) times daily as needed for cough. 120 mL 0  . hydrALAZINE (APRESOLINE) 50 MG tablet Take 100 mg by mouth 3 (three) times daily.    . hydrochlorothiazide (HYDRODIURIL) 25 MG tablet Take 1 tablet (25 mg total) by mouth daily. 30 tablet 0  . insulin aspart (NOVOLOG) 100 UNIT/ML injection < 150 take 7 units novolog before the meal 151- 200 inject 8 units Novolog 201- 250 inject 10 units Novolog 251- 300 inject 11 units  Novolog 301 - 350 inject 13 units Novolog 351 - 400 inject 14 units Novolog 401 - 450 inject 16 units Novolog 451 - 500 inject 17 units Novolog 501 - 550 inject 19  units Novolog, check blood sugar every 2 hours; if blood sugar does not decrease below 500 then call doctor's office.  10 mL 0  . Insulin Glargine (LANTUS) 100 UNIT/ML Solostar Pen Inject 25 Units into the skin at bedtime. 15 mL 1  . labetalol (NORMODYNE) 300 MG tablet Take 300 mg by mouth 2 (two) times daily.    [name] losartan (COZAAR) 100 MG tablet Take 1 tablet (100 mg total) by mouth daily. 30 tablet 11  . Multiple Vitamin (MULITIVITAMIN WITH MINERALS) TABS Take 1 tablet by mouth every morning.     . pravastatin (PRAVACHOL) 40 MG tablet Take 40 mg by mouth daily.    . sodium chloride (OCEAN) 0.65 % SOLN nasal spray Place 1 spray into both nostrils as needed for congestion. 1 Bottle 0   No current facility-administered medications for this visit.   Family History  Problem  Relation Age of Onset  . Colon cancer Sister   . Aneurysm Father 20    died of rupture   History   Social History  . Marital Status: Married    Spouse Name: N/A    Number of Children: N/A  . Years of Education: N/A   Social History Main Topics  . Smoking status: Never Smoker   . Smokeless tobacco: Never Used  . Alcohol Use: Yes     Comment: Wine occasionally (no more than 2 glasses per month)  . Drug Use: No  . Sexual Activity: Not on file   Other Topics Concern  . Not on file   Social History Narrative   Review of Systems: Pertinent items are noted in HPI. Objective:  Physical Exam: Filed Vitals:   09/15/14 1415  BP: 137/62  Pulse: 66  Temp: 98 F (36.7 C)  TempSrc: Oral  Height: 6\' 1"  (1.854 m)  Weight: 215 lb 6.4 oz (97.705 kg)  SpO2: 99%   General: sitting in chair, NAD HEENT: PERRL, EOMI, no scleral icterus Cardiac: RRR, no rubs, murmurs or gallops Pulm: clear to auscultation bilaterally, moving normal volumes of air Abd: soft, nontender, nondistended, BS present Ext: warm and well perfused, no pedal edema Neuro: alert and oriented X3, cranial nerves II-XII grossly intact  Assessment & Plan:  Please see problem oriented charting  Pt discussed with Dr. Dareen Piano

## 2014-09-15 NOTE — Patient Instructions (Signed)
General Instructions:   Please bring your medicines with you each time you come to clinic.  Medicines may include prescription medications, over-the-counter medications, herbal remedies, eye drops, vitamins, or other pills.  You are doing wonderful and glad you are doing better.   Progress Toward Treatment Goals:  Treatment Goal 09/02/2014  Hemoglobin A1C at goal  Blood pressure deteriorated    Self Care Goals & Plans:  Self Care Goal 08/04/2014  Manage my medications take my medicines as prescribed; bring my medications to every visit; refill my medications on time; follow the sick day instructions if I am sick  Monitor my health keep track of my blood glucose; keep track of my blood pressure; keep track of my weight; check my feet daily  Eat healthy foods eat more vegetables; eat baked foods instead of fried foods; eat fruit for snacks and desserts; eat smaller portions; drink diet soda or water instead of juice or soda  Be physically active find an activity I enjoy  Meeting treatment goals -    Home Blood Glucose Monitoring 09/02/2014  Check my blood sugar 4 times a day  When to check my blood sugar before meals; at bedtime     Care Management & Community Referrals:  Referral 05/02/2014  Referrals made for care management support none needed  Referrals made to community resources -

## 2014-09-15 NOTE — Progress Notes (Signed)
INTERNAL MEDICINE TEACHING ATTENDING ADDENDUM - Linard Daft, MD: I reviewed and discussed at the time of visit with the resident Dr. Sadek, the patient's medical history, physical examination, diagnosis and results of pertinent tests and treatment and I agree with the patient's care as documented.  

## 2014-09-15 NOTE — Assessment & Plan Note (Signed)
BP Readings from Last 3 Encounters:  09/15/14 137/62  09/02/14 171/83  08/23/14 147/75    Lab Results  Component Value Date   NA 141 09/02/2014   K 3.7 09/02/2014   CREATININE 2.02* 09/02/2014    Assessment: Blood pressure control:   Progress toward BP goal:    Comments: at goal  Plan: Medications:  cont clonidine 0.3mg , HCTZ 25mg , hydralazine 50mg , labetalol 300mg , cozaar 100mg  Educational resources provided:   Self management tools provided:   Other plans: f/u in 2-3 months, having no signs or symptoms of CHF exacerbation needing repeat Echo

## 2014-10-07 ENCOUNTER — Telehealth: Payer: Self-pay | Admitting: *Deleted

## 2014-10-07 NOTE — Telephone Encounter (Signed)
Pt calls and ask for refill of furosemide, states when he takes it he doesn't get breathless at night, does not happen every night but it doesn't at all when taking furosemide. i ask if he takes the hctz and he said yes, i ask that since his furosemide was discontinued in 01/2014 if he has continued to take it, he was a little hesitant and stated that he does sometimes, i ask if the pharmacy was continuing to fill it and he said no. He was ask to come in this week and see a physician just to evaluate the breathlessness, he stated he has an appt with dr Marinda Elk 3/22 and would be fine til then. Spoke w/ dr Marinda Elk and called pt back he he agreed to an appt since dr Marinda Elk had advised it. appt fri 2/26 dr Hayes Ludwig

## 2014-10-07 NOTE — Telephone Encounter (Signed)
Agree with plan 

## 2014-10-09 ENCOUNTER — Telehealth: Payer: Self-pay | Admitting: Internal Medicine

## 2014-10-09 NOTE — Telephone Encounter (Signed)
Call to patient to confirm appointment for 10/10/14 at 10:15 lmtcb

## 2014-10-10 ENCOUNTER — Encounter: Payer: Self-pay | Admitting: Internal Medicine

## 2014-10-10 ENCOUNTER — Ambulatory Visit (INDEPENDENT_AMBULATORY_CARE_PROVIDER_SITE_OTHER): Payer: Medicare Other | Admitting: Internal Medicine

## 2014-10-10 VITALS — BP 144/64 | HR 69 | Temp 97.2°F | Wt 215.9 lb

## 2014-10-10 DIAGNOSIS — I10 Essential (primary) hypertension: Secondary | ICD-10-CM

## 2014-10-10 DIAGNOSIS — E1129 Type 2 diabetes mellitus with other diabetic kidney complication: Secondary | ICD-10-CM

## 2014-10-10 DIAGNOSIS — E1165 Type 2 diabetes mellitus with hyperglycemia: Secondary | ICD-10-CM

## 2014-10-10 DIAGNOSIS — IMO0002 Reserved for concepts with insufficient information to code with codable children: Secondary | ICD-10-CM

## 2014-10-10 DIAGNOSIS — I5032 Chronic diastolic (congestive) heart failure: Secondary | ICD-10-CM

## 2014-10-10 LAB — GLUCOSE, CAPILLARY: Glucose-Capillary: 293 mg/dL — ABNORMAL HIGH (ref 70–99)

## 2014-10-10 MED ORDER — INSULIN GLARGINE 100 UNIT/ML SOLOSTAR PEN: 28.0000 [IU] | PEN_INJECTOR | Freq: Every day | SUBCUTANEOUS | Status: DC

## 2014-10-10 NOTE — Assessment & Plan Note (Signed)
Lab Results  Component Value Date   HGBA1C 6.4 09/02/2014   HGBA1C 11.0 04/25/2014   HGBA1C 9.3 01/24/2014     Assessment: Diabetes control:  controlled Progress toward A1C goal:   at goal Comments: He is on Lantus 25 unit qHS and Novolog SSI. We reviewed his CBG meter download, average blood sugar is 204, lowest 150, highest 264. He notes that his before breakfast blood sugars are in the 130-150 range. Denies hypoglycemia signs and symptoms.   Plan: Medications: continue Novolog SSI, increase Lantus to 28 units at bedtime Home glucose monitoring: Frequency:   Timing:   Instruction/counseling given: reminded to bring blood glucose meter & log to each visit and reminded to bring medications to each visit Educational resources provided:   Self management tools provided:   Other plans: Follow up in 2-3 weeks.

## 2014-10-10 NOTE — Assessment & Plan Note (Signed)
BP Readings from Last 3 Encounters:  10/10/14 144/64  09/15/14 137/62  09/02/14 171/83    Lab Results  Component Value Date   NA 141 09/02/2014   K 3.7 09/02/2014   CREATININE 2.02* 09/02/2014    Assessment: Blood pressure control:  mildly elevated Progress toward BP goal:   close to goal Comments: on clonidine 0.3mg  BID, hydralazine 100mg  TID, HCTZ 25 mg daily, labetalol 300mg  BID, Losartan 100mg  daily  Plan: Medications:  continue current medications Educational resources provided:   Self management tools provided:   Other plans: Follow up in 2-3 weeks

## 2014-10-10 NOTE — Patient Instructions (Signed)
General Instructions: -Please do not take Lasix and hydrochlorothiazide Increase the Lantus to 28 units at bedtime. Continue checking your blood sugar at least 3 times per day as you are doing.  -They will call you with the echocardiogram appointment and the Heart Failure appointment.   Follow up with Dr. Marinda Elk in March.    Thank you for bringing your medicines today. This helps [name] keep you safe from mistakes.   Progress Toward Treatment Goals:  Treatment Goal 09/02/2014  Hemoglobin A1C at goal  Blood pressure deteriorated    Self Care Goals & Plans:  Self Care Goal 09/15/2014  Manage my medications take my medicines as prescribed; bring my medications to every visit; refill my medications on time  Monitor my health bring my glucose meter and log to each visit; keep track of my blood pressure; bring my blood pressure log to each visit; check my feet daily  Eat healthy foods eat more vegetables; eat foods that are low in salt; eat baked foods instead of fried foods  Be physically active find an activity I enjoy; take a walk every day  Meeting treatment goals -    Home Blood Glucose Monitoring 09/02/2014  Check my blood sugar 4 times a day  When to check my blood sugar before meals; at bedtime     Care Management & Community Referrals:  Referral 05/02/2014  Referrals made for care management support none needed  Referrals made to community resources -

## 2014-10-10 NOTE — Progress Notes (Signed)
   Subjective:    Patient ID: Charles Hart, male    DOB: 27-Dec-1955, 59 y.o.   MRN: [id number]  HPI Charles Hart is a 59 yr old man with PMH of HTN, EthOH induced cardiomyopathy, CKD2, DM2, HLP, nephrotic syndrome,  grd2 diastolic Heart Failure who presents for evaluation of shortness of breath that is worse at night.  Denies PND, orthopnea, edema, weight changes.  He has been taking Lasix as needed at home with concomitant use with HCTZ and reports improvement of his SOB when he takes Lasix.  His last 2D echo was in 03/2013 with EF of 55-60% with grd 2 diastolic dysfunction but had previous 2D echo from 2003 with EF of 35-40%.  In reviewing his chart, HCTZ was added back on a visit in Nov 2014 with concomitant use of Lasix 100mg  daily due to uncontrolled BP but Lasix was stopped on Dec 2014 for unclear reasons. He has kept the "old" Lasix with him and    Review of Systems     Objective:   Physical Exam        Assessment & Plan:

## 2014-10-10 NOTE — Assessment & Plan Note (Addendum)
He complains of increase in SOB off and now for months now including last week, none this week. 2 pillow orthopnea, no PND. Unsure if weight has changed, not weighing himself every day as he used to. Has been taking Lasix 80mg  PRN as recent as last week. Unsure if he had increased salt intake last week. Last BNP while in in the hospital in the 1,100 range which is significant for CKD 3. CXR at that time also showing pulmonary edema.  On physical exam today appears only mildly hypervolemic (mild LE edema). He refuses referral to the HF clinic.  Wants to continue following with Dr. [name].  -Ordered 2D echo -Pt advised to start weighting himself every day -Pt will call his Cardiologist for a follow up appointment -He wants to continue taking HCTZ -He was advised not to take Lasix with HCTZ, he agreed with this plan, he will see his Cardiologist then decided on staying on HCTZ or disc this medication and starting back on Lasix -Ordered BMET for Na and Cr monitoring (since he was taking HCTZ and Lasix last week) but pt refused labs, will defer to next visit if needed.

## 2014-10-12 NOTE — Progress Notes (Signed)
Internal Medicine Clinic Attending  Case discussed with Dr. Kennerly soon after the resident saw the patient.  We reviewed the resident's history and exam and pertinent patient test results.  I agree with the assessment, diagnosis, and plan of care documented in the resident's note.  

## 2014-10-30 ENCOUNTER — Other Ambulatory Visit: Payer: Self-pay | Admitting: Internal Medicine

## 2014-10-31 ENCOUNTER — Encounter: Payer: Self-pay | Admitting: *Deleted

## 2014-11-04 ENCOUNTER — Encounter: Payer: Self-pay | Admitting: Internal Medicine

## 2014-11-04 ENCOUNTER — Ambulatory Visit (INDEPENDENT_AMBULATORY_CARE_PROVIDER_SITE_OTHER): Payer: Medicare Other | Admitting: Internal Medicine

## 2014-11-04 VITALS — BP 181/85 | HR 69 | Temp 97.9°F

## 2014-11-04 DIAGNOSIS — I69351 Hemiplegia and hemiparesis following cerebral infarction affecting right dominant side: Secondary | ICD-10-CM | POA: Diagnosis not present

## 2014-11-04 DIAGNOSIS — E785 Hyperlipidemia, unspecified: Secondary | ICD-10-CM | POA: Diagnosis not present

## 2014-11-04 DIAGNOSIS — I5032 Chronic diastolic (congestive) heart failure: Secondary | ICD-10-CM | POA: Diagnosis not present

## 2014-11-04 DIAGNOSIS — Z8673 Personal history of transient ischemic attack (TIA), and cerebral infarction without residual deficits: Secondary | ICD-10-CM

## 2014-11-04 DIAGNOSIS — N189 Chronic kidney disease, unspecified: Secondary | ICD-10-CM

## 2014-11-04 DIAGNOSIS — Z794 Long term (current) use of insulin: Secondary | ICD-10-CM | POA: Diagnosis not present

## 2014-11-04 DIAGNOSIS — N049 Nephrotic syndrome with unspecified morphologic changes: Secondary | ICD-10-CM

## 2014-11-04 DIAGNOSIS — E1129 Type 2 diabetes mellitus with other diabetic kidney complication: Secondary | ICD-10-CM

## 2014-11-04 DIAGNOSIS — E1121 Type 2 diabetes mellitus with diabetic nephropathy: Secondary | ICD-10-CM

## 2014-11-04 DIAGNOSIS — Z0000 Encounter for general adult medical examination without abnormal findings: Secondary | ICD-10-CM

## 2014-11-04 DIAGNOSIS — E1165 Type 2 diabetes mellitus with hyperglycemia: Principal | ICD-10-CM

## 2014-11-04 DIAGNOSIS — D631 Anemia in chronic kidney disease: Secondary | ICD-10-CM | POA: Insufficient documentation

## 2014-11-04 DIAGNOSIS — D649 Anemia, unspecified: Secondary | ICD-10-CM | POA: Diagnosis not present

## 2014-11-04 DIAGNOSIS — IMO0002 Reserved for concepts with insufficient information to code with codable children: Secondary | ICD-10-CM

## 2014-11-04 DIAGNOSIS — I10 Essential (primary) hypertension: Secondary | ICD-10-CM

## 2014-11-04 DIAGNOSIS — Z5181 Encounter for therapeutic drug level monitoring: Secondary | ICD-10-CM

## 2014-11-04 DIAGNOSIS — I429 Cardiomyopathy, unspecified: Secondary | ICD-10-CM

## 2014-11-04 LAB — CBC WITH DIFFERENTIAL/PLATELET
BASOS ABS: 0 10*3/uL (ref 0.0–0.1)
Basophils Relative: 0 % (ref 0–1)
EOS PCT: 5 % (ref 0–5)
Eosinophils Absolute: 0.3 10*3/uL (ref 0.0–0.7)
HCT: 31.9 % — ABNORMAL LOW (ref 39.0–52.0)
Hemoglobin: 10.3 g/dL — ABNORMAL LOW (ref 13.0–17.0)
LYMPHS PCT: 26 % (ref 12–46)
Lymphs Abs: 1.3 10*3/uL (ref 0.7–4.0)
MCH: 26.7 pg (ref 26.0–34.0)
MCHC: 32.3 g/dL (ref 30.0–36.0)
MCV: 82.6 fL (ref 78.0–100.0)
MPV: 8.6 fL (ref 8.6–12.4)
Monocytes Absolute: 0.4 10*3/uL (ref 0.1–1.0)
Monocytes Relative: 8 % (ref 3–12)
NEUTROS ABS: 3.1 10*3/uL (ref 1.7–7.7)
Neutrophils Relative %: 61 % (ref 43–77)
PLATELETS: 347 10*3/uL (ref 150–400)
RBC: 3.86 MIL/uL — AB (ref 4.22–5.81)
RDW: 13.8 % (ref 11.5–15.5)
WBC: 5 10*3/uL (ref 4.0–10.5)

## 2014-11-04 LAB — COMPLETE METABOLIC PANEL WITH GFR
ALT: 11 U/L (ref 0–53)
AST: 17 U/L (ref 0–37)
Albumin: 3.7 g/dL (ref 3.5–5.2)
Alkaline Phosphatase: 56 U/L (ref 39–117)
BUN: 30 mg/dL — ABNORMAL HIGH (ref 6–23)
CALCIUM: 8.2 mg/dL — AB (ref 8.4–10.5)
CO2: 28 mEq/L (ref 19–32)
Chloride: 103 mEq/L (ref 96–112)
Creat: 2.25 mg/dL — ABNORMAL HIGH (ref 0.50–1.35)
GFR, EST AFRICAN AMERICAN: 36 mL/min — AB
GFR, Est Non African American: 31 mL/min — ABNORMAL LOW
Glucose, Bld: 166 mg/dL — ABNORMAL HIGH (ref 70–99)
Potassium: 4 mEq/L (ref 3.5–5.3)
Sodium: 138 mEq/L (ref 135–145)
TOTAL PROTEIN: 6 g/dL (ref 6.0–8.3)
Total Bilirubin: 0.7 mg/dL (ref 0.2–1.2)

## 2014-11-04 LAB — POCT GLYCOSYLATED HEMOGLOBIN (HGB A1C): HEMOGLOBIN A1C: 7.7

## 2014-11-04 LAB — GLUCOSE, CAPILLARY: GLUCOSE-CAPILLARY: 172 mg/dL — AB (ref 70–99)

## 2014-11-04 MED ORDER — INSULIN GLARGINE 100 UNIT/ML SOLOSTAR PEN: 30.0000 [IU] | PEN_INJECTOR | Freq: Every day | SUBCUTANEOUS | Status: DC

## 2014-11-04 NOTE — Assessment & Plan Note (Signed)
Assessment: Patient reports occasional tremor of the right upper extremity and subjective weakness when lifting since his stroke in November 2013.  Physical exam today shows no tremor and normal strength.  Plan: Continue clopidogrel 75 mg daily.  Continue efforts to control blood pressure and diabetes.

## 2014-11-04 NOTE — Assessment & Plan Note (Signed)
Lab Results  Component Value Date   HGB 9.2* 09/02/2014   HGB 7.6* 08/23/2014   HGB 7.7* 08/22/2014   HGB 9.9* 08/21/2014   HGB 10.2* 08/20/2014   HGB 10.6* 05/18/2014     Lab Results  Component Value Date   MCV 83.2 09/02/2014    Assessment: Patient has asymptomatic normocytic anemia likely related to his chronic kidney disease.  Plan: Check a CBC with differential and ferritin level today.

## 2014-11-04 NOTE — Patient Instructions (Signed)
Please return in one month for follow-up of your blood pressure; make sure to take your usual medications on the morning of your visit. Increase Lantus insulin to 30 units once daily . Please follow-up with your cardiologist Dr. [name]. Please follow-up with your kidney doctor.

## 2014-11-04 NOTE — Assessment & Plan Note (Signed)
Assessment: Patient is doing well today, without signs or symptoms of decompensation.  His current regimen includes clonidine 0.3 mg twice a day, digoxin 0.25 mg daily, hydralazine 100 mg 3 times a day, hydrochlorothiazide 25 mg daily, labetalol 300 mg twice a day, and losartan 100 mg daily.  Patient was previously on furosemide, but is currently on hydrochlorothiazide alone;     Plan: Continue furosemide 60 mg daily; continue current antihypertensive regimen; continue digoxin; check a metabolic panel and digoxin level today.  As noted by Dr. Hayes Ludwig on 10/10/2014, patient preferred to continue hydrochlorothiazide rather than furosemide until he sees his cardiologist Dr. [name].  Will continue current medication regimen pending that appointment.  I'm not sure that continuing digoxin is necessary given the past improvement in his left ventricular function; will check a level today and defer to Dr. Kyla Balzarine opinion on this.

## 2014-11-04 NOTE — Assessment & Plan Note (Addendum)
BP Readings from Last 3 Encounters:  11/04/14 181/85  10/10/14 144/64  09/15/14 137/62    Lab Results  Component Value Date   NA 141 09/02/2014   K 3.7 09/02/2014   CREATININE 2.02* 09/02/2014    Assessment: Blood pressure control: moderately elevated Progress toward BP goal:  deteriorated Comments: Blood pressure is elevated today, the patient did not take any of his morning medications before coming to clinic.  Plan: Medications:  continue current medications; I advised patient to take his medications as usual when he is coming to the clinic  Educational resources provided: brochure Self management tools provided: home blood pressure logbook Other plans: Return in one month for follow-up.

## 2014-11-04 NOTE — Telephone Encounter (Signed)
Open in error

## 2014-11-04 NOTE — Assessment & Plan Note (Addendum)
Lipids:    Component Value Date/Time   CHOL 189 08/20/2014 1229   TRIG 183* 08/20/2014 1229   HDL 37* 08/20/2014 1229   LDLCALC 115* 08/20/2014 1229   LDLDIRECT 80 07/18/2012 1010   VLDL 37 08/20/2014 1229   CHOLHDL 5.1 08/20/2014 1229    Assessment: Patient is doing well on pravastatin 40 mg daily; his LDL in January was above target.  Plan: Check a lipid panel today; continue pravastatin 40 mg daily pending the result.

## 2014-11-04 NOTE — Progress Notes (Signed)
   Subjective:    Patient ID: Charles Hart, male    DOB: 11-13-1955, 59 y.o.   MRN: [name]  HPI Patient returns for management of his type 2 diabetes mellitus, hypertension, hyperlipidemia, and other chronic medical problems.  He has no acute complaints today.  He reports occasional tremor of his right arm since his prior stroke in 2013, and also reports occasional subjective weakness of the right arm when lifting after the stroke.  Patient did not take his medications this morning before coming to clinic; otherwise he reports being compliant with his medications.  He did not bring his medications to clinic, but confirmed his medication list from memory.  He did not bring his blood glucose meter, but reports that his recent blood sugars have generally been in the mid 100s, and are somewhat improved following the increase in his Lantus insulin in February.  He denies any episodes of hypoglycemia.  He has not seen his cardiologist or his nephrologist recently.  He was seen in February for shortness of breath in our clinic; today he denies any shortness of breath, orthopnea, or PND.   Review of Systems  Constitutional: Negative for fever, chills and diaphoresis.  Respiratory: Negative for shortness of breath and wheezing.   Cardiovascular: Negative for chest pain and leg swelling.  Gastrointestinal: Negative for nausea, vomiting, abdominal pain, blood in stool and anal bleeding.  Genitourinary: Negative for dysuria and difficulty urinating.  Musculoskeletal: Negative for myalgias and arthralgias.    I reviewed and updated the medication list, allergies, past medical history, past surgical history, family history, and social history.     Objective:   Physical Exam  Constitutional: No distress.  Cardiovascular: Normal rate and regular rhythm.  Exam reveals no gallop, no S3, no S4 and no friction rub.   No murmur heard. No lower extremity edema.  Pulmonary/Chest: Effort normal and breath  sounds normal. No respiratory distress. He has no wheezes. He has no rales.  Abdominal: Soft. Bowel sounds are normal. He exhibits no distension. There is no tenderness. There is no rebound and no guarding.  Neurological: He has normal strength. He displays no tremor.       Assessment & Plan:

## 2014-11-04 NOTE — Assessment & Plan Note (Signed)
Patient declined all due immunizations; he also declined HIV and hepatitis C screening.

## 2014-11-04 NOTE — Assessment & Plan Note (Signed)
Assessment:  Patient has been followed by [facility] at [facility], but has not seen his nephrologist recently.  His kidney biopsy on 05/30/2013 showed advanced diffuse and nodular diabetic nephropathy with vascular changes consistent with long-standing difficult to control hypertension.    Plan: I advised patient to follow-up with his nephrologist.  Will continue efforts to better control his diabetes and hypertension.

## 2014-11-04 NOTE — Assessment & Plan Note (Signed)
Lab Results  Component Value Date   HGBA1C 7.7 11/04/2014   HGBA1C 6.4 09/02/2014   HGBA1C 11.0 04/25/2014     Assessment: Diabetes control: fair control Progress toward A1C goal:  deteriorated Comments: Hemoglobin A1c has increased compared to January; patient's current regimen is Lantus 28 units daily and mealtime NovoLog insulin on a sliding scale based on pre-meal blood sugar.  Plan: Medications:  Increase Lantus to 30 units at bedtime; continue current mealtime NovoLog scale. Home glucose monitoring: Frequency: 3 times a day Timing: before meals Instruction/counseling given: reminded to bring blood glucose meter & log to each visit and reminded to bring medications to each visit Educational resources provided: brochure Other plans: Check a comprehensive metabolic panel.

## 2014-11-05 LAB — LIPID PANEL
CHOL/HDL RATIO: 4.7 ratio
CHOLESTEROL: 164 mg/dL (ref 0–200)
HDL: 35 mg/dL — AB (ref >=40)
LDL CALC: 91 mg/dL (ref 0–99)
Triglycerides: 189 mg/dL — ABNORMAL HIGH (ref <150)
VLDL: 38 mg/dL (ref 0–40)

## 2014-11-05 LAB — DIGOXIN LEVEL: DIGOXIN LVL: 0.3 ng/mL — AB (ref 0.8–2.0)

## 2014-11-05 LAB — FERRITIN: Ferritin: 246 ng/mL (ref 22–322)

## 2014-11-06 NOTE — Progress Notes (Signed)
Quick Note:  Hemoglobin has improved; ferritin is normal. ______

## 2014-11-06 NOTE — Progress Notes (Signed)
Quick Note:  LDL is at goal on pravastatin 40 mg daily. ______

## 2014-11-06 NOTE — Progress Notes (Signed)
Quick Note:  CKD is stable. ______

## 2014-11-09 ENCOUNTER — Emergency Department (HOSPITAL_COMMUNITY)
Admission: EM | Admit: 2014-11-09 | Discharge: 2014-11-10 | Disposition: A | Discharge status: Home / Self Care | Payer: Self-pay | Attending: Emergency Medicine | Admitting: Emergency Medicine

## 2014-11-09 ENCOUNTER — Encounter (HOSPITAL_COMMUNITY): Payer: Self-pay | Admitting: Emergency Medicine

## 2014-11-09 ENCOUNTER — Emergency Department (HOSPITAL_COMMUNITY): Payer: Medicare Other

## 2014-11-09 DIAGNOSIS — R519 Headache, unspecified: Secondary | ICD-10-CM

## 2014-11-09 DIAGNOSIS — Z79899 Other long term (current) drug therapy: Secondary | ICD-10-CM | POA: Insufficient documentation

## 2014-11-09 DIAGNOSIS — Z8673 Personal history of transient ischemic attack (TIA), and cerebral infarction without residual deficits: Secondary | ICD-10-CM | POA: Insufficient documentation

## 2014-11-09 DIAGNOSIS — Z794 Long term (current) use of insulin: Secondary | ICD-10-CM | POA: Insufficient documentation

## 2014-11-09 DIAGNOSIS — R112 Nausea with vomiting, unspecified: Secondary | ICD-10-CM | POA: Insufficient documentation

## 2014-11-09 DIAGNOSIS — Z86010 Personal history of colonic polyps: Secondary | ICD-10-CM | POA: Insufficient documentation

## 2014-11-09 DIAGNOSIS — R51 Headache: Secondary | ICD-10-CM

## 2014-11-09 DIAGNOSIS — N189 Chronic kidney disease, unspecified: Secondary | ICD-10-CM | POA: Insufficient documentation

## 2014-11-09 DIAGNOSIS — Z872 Personal history of diseases of the skin and subcutaneous tissue: Secondary | ICD-10-CM | POA: Insufficient documentation

## 2014-11-09 DIAGNOSIS — E1140 Type 2 diabetes mellitus with diabetic neuropathy, unspecified: Secondary | ICD-10-CM | POA: Insufficient documentation

## 2014-11-09 DIAGNOSIS — R011 Cardiac murmur, unspecified: Secondary | ICD-10-CM | POA: Insufficient documentation

## 2014-11-09 DIAGNOSIS — I2510 Atherosclerotic heart disease of native coronary artery without angina pectoris: Secondary | ICD-10-CM | POA: Insufficient documentation

## 2014-11-09 DIAGNOSIS — H9191 Unspecified hearing loss, right ear: Secondary | ICD-10-CM | POA: Insufficient documentation

## 2014-11-09 DIAGNOSIS — E785 Hyperlipidemia, unspecified: Secondary | ICD-10-CM | POA: Insufficient documentation

## 2014-11-09 DIAGNOSIS — I129 Hypertensive chronic kidney disease with stage 1 through stage 4 chronic kidney disease, or unspecified chronic kidney disease: Secondary | ICD-10-CM | POA: Insufficient documentation

## 2014-11-09 DIAGNOSIS — I160 Hypertensive urgency: Secondary | ICD-10-CM

## 2014-11-09 DIAGNOSIS — I509 Heart failure, unspecified: Secondary | ICD-10-CM | POA: Insufficient documentation

## 2014-11-09 DIAGNOSIS — E11319 Type 2 diabetes mellitus with unspecified diabetic retinopathy without macular edema: Secondary | ICD-10-CM | POA: Insufficient documentation

## 2014-11-09 DIAGNOSIS — Z9889 Other specified postprocedural states: Secondary | ICD-10-CM | POA: Insufficient documentation

## 2014-11-09 DIAGNOSIS — Z7902 Long term (current) use of antithrombotics/antiplatelets: Secondary | ICD-10-CM | POA: Insufficient documentation

## 2014-11-09 DIAGNOSIS — R110 Nausea: Secondary | ICD-10-CM

## 2014-11-09 DIAGNOSIS — I252 Old myocardial infarction: Secondary | ICD-10-CM | POA: Insufficient documentation

## 2014-11-09 LAB — COMPREHENSIVE METABOLIC PANEL
ALT: 15 U/L (ref 0–53)
ANION GAP: 12 (ref 5–15)
AST: 21 U/L (ref 0–37)
Albumin: 3.5 g/dL (ref 3.5–5.2)
Alkaline Phosphatase: 65 U/L (ref 39–117)
BUN: 31 mg/dL — ABNORMAL HIGH (ref 6–23)
CO2: 27 mmol/L (ref 19–32)
CREATININE: 2.41 mg/dL — AB (ref 0.50–1.35)
Calcium: 9.6 mg/dL (ref 8.4–10.5)
Chloride: 101 mmol/L (ref 96–112)
GFR calc Af Amer: 33 mL/min — ABNORMAL LOW (ref >90)
GFR calc non Af Amer: 28 mL/min — ABNORMAL LOW (ref >90)
Glucose, Bld: 180 mg/dL — ABNORMAL HIGH (ref 70–99)
POTASSIUM: 3.6 mmol/L (ref 3.5–5.1)
Sodium: 140 mmol/L (ref 135–145)
Total Bilirubin: 0.8 mg/dL (ref 0.3–1.2)
Total Protein: 6.7 g/dL (ref 6.0–8.3)

## 2014-11-09 LAB — CBC WITH DIFFERENTIAL/PLATELET
Basophils Absolute: 0 10*3/uL (ref 0.0–0.1)
Basophils Relative: 0 % (ref 0–1)
EOS ABS: 0 10*3/uL (ref 0.0–0.7)
Eosinophils Relative: 1 % (ref 0–5)
HCT: 34.6 % — ABNORMAL LOW (ref 39.0–52.0)
HEMOGLOBIN: 11.5 g/dL — AB (ref 13.0–17.0)
LYMPHS ABS: 1 10*3/uL (ref 0.7–4.0)
Lymphocytes Relative: 17 % (ref 12–46)
MCH: 27.4 pg (ref 26.0–34.0)
MCHC: 33.2 g/dL (ref 30.0–36.0)
MCV: 82.4 fL (ref 78.0–100.0)
MONOS PCT: 5 % (ref 3–12)
Monocytes Absolute: 0.3 10*3/uL (ref 0.1–1.0)
NEUTROS ABS: 4.7 10*3/uL (ref 1.7–7.7)
Neutrophils Relative %: 77 % (ref 43–77)
Platelets: 358 10*3/uL (ref 150–400)
RBC: 4.2 MIL/uL — AB (ref 4.22–5.81)
RDW: 13.1 % (ref 11.5–15.5)
WBC: 6.1 10*3/uL (ref 4.0–10.5)

## 2014-11-09 LAB — URINALYSIS, ROUTINE W REFLEX MICROSCOPIC
BILIRUBIN URINE: NEGATIVE
Glucose, UA: NEGATIVE mg/dL
KETONES UR: NEGATIVE mg/dL
Leukocytes, UA: NEGATIVE
Nitrite: NEGATIVE
SPECIFIC GRAVITY, URINE: 1.016 (ref 1.005–1.030)
Urobilinogen, UA: 1 mg/dL (ref 0.0–1.0)
pH: 6.5 (ref 5.0–8.0)

## 2014-11-09 LAB — URINE MICROSCOPIC-ADD ON

## 2014-11-09 LAB — LIPASE, BLOOD: LIPASE: 49 U/L (ref 11–59)

## 2014-11-09 MED ORDER — LABETALOL HCL 5 MG/ML IV SOLN
40.0000 mg | INTRAVENOUS | Status: DC | PRN
  Administered 2014-11-09: 40 mg via INTRAVENOUS
  Filled 2014-11-09: qty 8

## 2014-11-09 MED ORDER — HEPARIN BOLUS VIA INFUSION
4000.0000 [IU] | Freq: Once | INTRAVENOUS | Status: AC
  Administered 2014-11-09: 4000 [IU] via INTRAVENOUS
  Filled 2014-11-09: qty 4000

## 2014-11-09 MED ORDER — METOCLOPRAMIDE HCL 5 MG/ML IJ SOLN
10.0000 mg | Freq: Once | INTRAMUSCULAR | Status: DC
Start: 2014-11-09 — End: 2014-11-09

## 2014-11-09 MED ORDER — FENTANYL CITRATE 0.05 MG/ML IJ SOLN
50.0000 ug | Freq: Once | INTRAMUSCULAR | Status: AC
  Administered 2014-11-09: 50 ug via INTRAVENOUS
  Filled 2014-11-09: qty 2

## 2014-11-09 MED ORDER — LABETALOL HCL 5 MG/ML IV SOLN
20.0000 mg | Freq: Once | INTRAVENOUS | Status: AC
  Administered 2014-11-09: 20 mg via INTRAVENOUS
  Filled 2014-11-09: qty 4

## 2014-11-09 MED ORDER — LABETALOL HCL 5 MG/ML IV SOLN: 80.0000 mg | INTRAVENOUS | Status: DC | PRN

## 2014-11-09 MED ORDER — HEPARIN (PORCINE) IN NACL 100-0.45 UNIT/ML-% IJ SOLN
1200.0000 [IU]/h | INTRAMUSCULAR | Status: DC
  Administered 2014-11-09: 1200 [IU]/h via INTRAVENOUS
  Filled 2014-11-09: qty 250

## 2014-11-09 MED ORDER — METOCLOPRAMIDE HCL 5 MG/ML IJ SOLN
10.0000 mg | Freq: Once | INTRAMUSCULAR | Status: AC
  Administered 2014-11-09: 10 mg via INTRAVENOUS
  Filled 2014-11-09: qty 2

## 2014-11-09 NOTE — ED Notes (Signed)
CT called about delay. CT states patient is second in line.

## 2014-11-09 NOTE — ED Provider Notes (Signed)
CSN: [id number]     Arrival date & time 11/09/14  2123 History   First MD Initiated Contact with Patient 11/09/14 2144     Chief Complaint  Patient presents with  . Emesis     (Consider location/radiation/quality/duration/timing/severity/associated sxs/prior Treatment) HPI Comments: Pt comes in with cc of headache and emesis. Pt has hx of HTN uncontrolled, CKD, CVA, HTN, CAD, DM. He is noted to have MAP of 150 mmhg, admits to missing his AM meds. Pt started having a headache this AM, initially the headache was 9/10, now the headaches is 5/10. Pt also has nausea, with multiple emesis, mostly foor material. No visual complains, seizures, altered mental status, loss of consciousness, new weakness, or numbness, no gait instability. No abd pain or diarrhea. Pt has no hx of headaches. No trauma.  ROS 10 Systems reviewed and are negative for acute change except as noted in the HPI.     Patient is a 59 y.o. male presenting with vomiting. The history is provided by the patient.  Emesis   Past Medical History  Diagnosis Date  . Dermatitis   . CHF (congestive heart failure)     LV function improved from 2004 to 2008.  Historically, moderately dilated LV with EF 30-40% by 2D echo 08/14/2002.  Mild CAD with severe LV dysfunction by cardiac cath 09/2002.  Normal coronary arteries and normal LV function by cardiac cath 09/19/2006.  A 2-D echo on 04/01/2009 showed mild concentric hypertrophy and normal systolic (LVEF  123456) and doppler C/W with grade 1 diastolic dysfunction..   . Hypertension   . Hyperlipidemia   . Hearing loss in right ear   . Cardiomyopathy     LV function improved from 2004 to 2008.  Historically, moderately dilated LV with EF 30-40% by 2D echo 08/14/2002.  Mild CAD with severe LV dysfunction by cardiac cath 09/2002.  Normal coronary arteries and normal LV function by cardiac cath 09/19/2006.  A 2-D echo on 04/01/2009 showed mild concentric hypertrophy and normal systolic (LVEF   123456) and doppler C/W with grade 1 diastolic dysfunction.  . DM neuropathy, painful   . CVA (cerebral vascular accident) 07/04/2012    MRI of the brain 07/04/2012 showed an acute infarct in the right basal ganglia involving the anterior putamen, anterior limb internal capsule, and head of the caudate; this measured approximately 2.5 cm in diameter.     . Hypertensive crisis 07/28/2012  . Myocardial infarction   . Diabetes mellitus     type 2  . Hypertensive urgency 08/20/2014  . Nephrotic syndrome 02/18/2013    A 24-hour urine collection 03/04/2013 showed total protein of 5,460 g and creatinine clearance of 80 mL/minute.  Patient was seen by Seward Meth at [facility] and a repeat 24-hour urine showed 10,407 mg protein.  Patient underwent kidney biopsy on 05/30/2013; pathology showed advanced diffuse and nodular diabetic nephropathy with vascular changes consistent with long-standing difficult to control hypertension.     . Adenomatous colon polyp 07/02/2011    Last colonoscopy May 06, 2011 by Dr. Owens Loffler, who recommended repeat colonoscopy in 5 years.   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 Background diabetic retinopathy 04/20/2012    Patient is followed by Dr. Katy Fitch    Past Surgical History  Procedure Laterality Date  . Colonoscopy    . Polypectomy    . Cardiac catheterization      3  times  . Foot surgery     Family History  Problem Relation Age of Onset  . Colon cancer Sister   . Aneurysm Father 56    died of rupture   History  Substance Use Topics  . Smoking status: Never Smoker   . Smokeless tobacco: Never Used  . Alcohol Use: Yes     Comment: Wine occasionally (no more than 2 glasses per month)    Review of Systems  Gastrointestinal: Positive for vomiting.  All other systems reviewed and are negative.     Allergies  Amlodipine and Lisinopril  Home Medications    Prior to Admission medications   Medication Sig Start Date End Date Taking? Authorizing Provider  ACCU-CHEK FASTCLIX LANCETS MISC Check blood sugar as directed up to 3 times a day.  Dx code E11.65 07/04/14  Yes Bertha Stakes, MD  ACCU-CHEK SMARTVIEW test strip CHECK BLOOD SUGAR LEVELS UP TO THREE TIMES DAILY AS DIRECTED 10/31/14  Yes Bertha Stakes, MD  aspirin-sod bicarb-citric acid (ALKA-SELTZER) 325 MG TBEF tablet Take 325 mg by mouth every 6 (six) hours as needed (stomach pain).   Yes Historical Provider, MD  cloNIDine (CATAPRES) 0.3 MG tablet Take 1 tablet (0.3 mg total) by mouth 2 (two) times daily. 09/02/14  Yes Langley Gauss Moding, MD  clopidogrel (PLAVIX) 75 MG tablet Take 75 mg by mouth daily.   Yes Historical Provider, MD  digoxin (LANOXIN) 0.25 MG tablet Take 0.25 mg by mouth daily.   Yes Historical Provider, MD  fluticasone (FLONASE) 50 MCG/ACT nasal spray Place 2 sprays into both nostrils daily as needed for allergies or rhinitis.   Yes Historical Provider, MD  gabapentin (NEURONTIN) 300 MG capsule Take 300 mg by mouth every morning.   Yes Historical Provider, MD  hydrALAZINE (APRESOLINE) 50 MG tablet Take 100 mg by mouth 3 (three) times daily.   Yes Historical Provider, MD  hydrochlorothiazide (HYDRODIURIL) 25 MG tablet Take 1 tablet (25 mg total) by mouth daily. 09/02/14  Yes Langley Gauss Moding, MD  ibuprofen (ADVIL,MOTRIN) 200 MG tablet Take 200 mg by mouth every 6 (six) hours as needed for moderate pain.   Yes Historical Provider, MD  insulin aspart (NOVOLOG) 100 UNIT/ML injection < 150 take 7 units novolog before the meal 151- 200 inject 8 units Novolog 201- 250 inject 10 units Novolog 251- 300 inject 11 units  Novolog 301 - 350 inject 13 units Novolog 351 - 400 inject 14 units Novolog 401 - 450 inject 16 units Novolog 451 - 500 inject 17 units Novolog 501 - 550 inject 19  units Novolog, check blood sugar every 2 hours; if blood sugar does not decrease below 500 then call doctor's  office. 06/02/14  Yes Wilber Oliphant, MD  Insulin Glargine (LANTUS) 100 UNIT/ML Solostar Pen Inject 30 Units into the skin at bedtime. 11/04/14  Yes Bertha Stakes, MD  labetalol (NORMODYNE) 300 MG tablet Take 300 mg by mouth 2 (two) times daily.   Yes Historical Provider, MD  losartan (COZAAR) 100 MG tablet Take 1 tablet (100 mg total) by mouth daily. 09/02/14  Yes Langley Gauss Moding, MD  Multiple Vitamin (MULITIVITAMIN WITH MINERALS) TABS Take 1 tablet by mouth every morning.    Yes Historical Provider, MD  pravastatin (PRAVACHOL) 40 MG tablet Take 40 mg by mouth daily.   Yes Historical Provider, MD  sodium chloride (OCEAN) 0.65 % SOLN nasal spray Place 1 spray into both nostrils as needed for congestion. 08/04/14  Yes Ejiroghene Arlyce Dice, MD  guaiFENesin-codeine (ROBITUSSIN AC) 100-10 MG/5ML syrup Take 5 mLs by mouth 3 (three) times daily as needed for cough. Patient not taking: Reported on 11/04/2014 08/04/14   Ejiroghene E Emokpae, MD   BP 185/89 mmHg  Pulse 80  Temp(Src) 98.4 F (36.9 C) (Oral)  Resp 26  Ht 5\' 6"  (1.676 m)  Wt 215 lb (97.523 kg)  BMI 34.72 kg/m2  SpO2 93% Physical Exam  Constitutional: He is oriented to person, place, and time. He appears well-developed.  HENT:  Head: Normocephalic and atraumatic.  Eyes: Conjunctivae and EOM are normal. Pupils are equal, round, and reactive to light.  Neck: Normal range of motion. Neck supple.  Cardiovascular: Normal rate and regular rhythm.   Murmur heard. Pulmonary/Chest: Effort normal and breath sounds normal.  Abdominal: Soft. Bowel sounds are normal. He exhibits no distension. There is no tenderness. There is no rebound and no guarding.  Neurological: He is alert and oriented to person, place, and time. No cranial nerve deficit. Coordination normal.  Cerebellar exam is normal (finger to nose) Sensory exam normal for bilateral upper and lower extremities - and patient is able to discriminate between sharp and dull. Motor exam is  4+/5   Skin: Skin is warm.  Nursing note and vitals reviewed.   ED Course  Procedures (including critical care time) Labs Review Labs Reviewed  CBC WITH DIFFERENTIAL/PLATELET - Abnormal; Notable for the following:    RBC 4.20 (*)    Hemoglobin 11.5 (*)    HCT 34.6 (*)    All other components within normal limits  COMPREHENSIVE METABOLIC PANEL - Abnormal; Notable for the following:    Glucose, Bld 180 (*)    BUN 31 (*)    Creatinine, Ser 2.41 (*)    GFR calc non Af Amer 28 (*)    GFR calc Af Amer 33 (*)    All other components within normal limits  URINALYSIS, ROUTINE W REFLEX MICROSCOPIC - Abnormal; Notable for the following:    Hgb urine dipstick TRACE (*)    Protein, ur >300 (*)    All other components within normal limits  URINE MICROSCOPIC-ADD ON - Abnormal; Notable for the following:    Casts HYALINE CASTS (*)    All other components within normal limits  LIPASE, BLOOD  CBC    Imaging Review No results found.   EKG Interpretation None      ED ECG REPORT   Date: 11/10/2014  Rate: 90  Rhythm: normal sinus rhythm  QRS Axis: normal  Intervals: normal  ST/T Wave abnormalities: nonspecific ST/T changes  Conduction Disutrbances:none  Narrative Interpretation:   Old EKG Reviewed: none available  I have personally reviewed the EKG tracing and agree with the computerized printout as noted.   @12 :00 - headache has resolved with reglan and iv hydration. Pt WAS ACCIDENTALLY GIVEN HEPARIN bolus, meant to be for another patient. I APOLOGIZED the patient and his wife. I called the Pharmacy team, and patient will not be billed for the heparin. CT head done post heparin bolus, and patient has no abnormality. Pt informed on the heparin and it's risk. MDM   Final diagnoses:  Headache  Hypertensive urgency    Pt comes in with cc of nausea and headache. His MAP is 150. ? HTN emergency. Pt admits to not taking his meds. No neuro complains or deficits.  Pt has no abd  complains, no diarrhea. Emesis could be due to the elevated Bp and headaches. CT head ordered, and is neg for  acute process.  Pt was given labetalol 20 mg ivp and 40 mg ivp and his MAP is below 120 mmhg.  Last BP was 180/104 and pt was given his night dose of clonidine. Pt was headache free at the 2nd reassessment.  Given the improvement in symptoms, control of his BP, no need for patient to be admitted, as he will likely respond to his home regimen. Close pcp f/u requested.    Varney Biles, MD 11/10/14 0040

## 2014-11-09 NOTE — ED Notes (Signed)
Pt reports he has vomited  X 6 today. C/o headache. Denies diarrhea or abdominal pain.

## 2014-11-09 NOTE — ED Notes (Signed)
MD Nanavati at bedside. Per MD, d/c Heparin

## 2014-11-09 NOTE — ED Notes (Signed)
MD Nanavati at bedside.  

## 2014-11-09 NOTE — Progress Notes (Deleted)
ANTICOAGULATION CONSULT NOTE - Initial Consult  Pharmacy Consult for heparin Indication: chest pain/ACS  Allergies  Allergen Reactions  . Amlodipine Swelling  . Lisinopril Cough    Patient Measurements: Height: 5\' 6"  (167.6 cm) Weight: 215 lb (97.523 kg) IBW/kg (Calculated) : 63.8 Heparin Dosing Weight: 85 kg  Vital Signs: Temp: 98.4 F (36.9 C) (03/27 2127) Temp Source: Oral (03/27 2127) BP: 213/105 mmHg (03/27 2200) Pulse Rate: 91 (03/27 2200)  Labs: No results for input(s): HGB, HCT, PLT, APTT, LABPROT, INR, HEPARINUNFRC, CREATININE, CKTOTAL, CKMB, TROPONINI in the last 72 hours.  Estimated Creatinine Clearance: 39.6 mL/min (by C-G formula based on Cr of 2.25).   Medical History: Past Medical History  Diagnosis Date  . Dermatitis   . CHF (congestive heart failure)     LV function improved from 2004 to 2008.  Historically, moderately dilated LV with EF 30-40% by 2D echo 08/14/2002.  Mild CAD with severe LV dysfunction by cardiac cath 09/2002.  Normal coronary arteries and normal LV function by cardiac cath 09/19/2006.  A 2-D echo on 04/01/2009 showed mild concentric hypertrophy and normal systolic (LVEF  123456) and doppler C/W with grade 1 diastolic dysfunction..   . Hypertension   . Hyperlipidemia   . Hearing loss in right ear   . Cardiomyopathy     LV function improved from 2004 to 2008.  Historically, moderately dilated LV with EF 30-40% by 2D echo 08/14/2002.  Mild CAD with severe LV dysfunction by cardiac cath 09/2002.  Normal coronary arteries and normal LV function by cardiac cath 09/19/2006.  A 2-D echo on 04/01/2009 showed mild concentric hypertrophy and normal systolic (LVEF  123456) and doppler C/W with grade 1 diastolic dysfunction.  . DM neuropathy, painful   . CVA (cerebral vascular accident) 07/04/2012    MRI of the brain 07/04/2012 showed an acute infarct in the right basal ganglia involving the anterior putamen, anterior limb internal capsule, and head of the  caudate; this measured approximately 2.5 cm in diameter.     . Hypertensive crisis 07/28/2012  . Myocardial infarction   . Diabetes mellitus     type 2  . Hypertensive urgency 08/20/2014  . Nephrotic syndrome 02/18/2013    A 24-hour urine collection 03/04/2013 showed total protein of 5,460 g and creatinine clearance of 80 mL/minute.  Patient was seen by Seward Meth at [facility] and a repeat 24-hour urine showed 10,407 mg protein.  Patient underwent kidney biopsy on 05/30/2013; pathology showed advanced diffuse and nodular diabetic nephropathy with vascular changes consistent with long-standing difficult to control hypertension.     . Adenomatous colon polyp 07/02/2011    Last colonoscopy May 06, 2011 by Dr. Owens Loffler, who recommended repeat colonoscopy in 5 years.   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 Background diabetic retinopathy 04/20/2012    Patient is followed by Dr. Katy Fitch     Medications:  Scheduled:  Infusions:   Assessment: 59 yo male with ACS will be started on heparin therapy.  Hgb 10.3 and Plt 347 K few days ago. Patient was not on any anticoagulation prior to admission.   Goal of Therapy:  Heparin level 0.3-0.7 units/ml Monitor platelets by anticoagulation protocol: Yes   Plan:  - heparin 4000 units iv bolus x1, then start heparin drip @ 1200 units/hr - 6hr heparin level - daily heparin level and CBC  Charles Hart, Tsz-Yin 11/09/2014,10:15 PM

## 2014-11-10 MED ORDER — CLONIDINE HCL 0.2 MG PO TABS
0.3000 mg | ORAL_TABLET | Freq: Once | ORAL | Status: AC
  Administered 2014-11-10: 0.3 mg via ORAL
  Filled 2014-11-10 (×2): qty 1

## 2014-11-10 MED ORDER — ONDANSETRON 8 MG PO TBDP: 8.0000 mg | ORAL_TABLET | Freq: Three times a day (TID) | ORAL | Status: DC | PRN

## 2014-11-10 NOTE — ED Notes (Signed)
MD Nanavati at bedside.  

## 2014-11-10 NOTE — Discharge Instructions (Signed)
Please take them home meds as prescribed. Zofran for nausea prescribed.   Hypertension Hypertension, commonly called high blood pressure, is when the force of blood pumping through your arteries is too strong. Your arteries are the blood vessels that carry blood from your heart throughout your body. A blood pressure reading consists of a higher number over a lower number, such as 110/72. The higher number (systolic) is the pressure inside your arteries when your heart pumps. The lower number (diastolic) is the pressure inside your arteries when your heart relaxes. Ideally you want your blood pressure below 120/80. Hypertension forces your heart to work harder to pump blood. Your arteries may become narrow or stiff. Having hypertension puts you at risk for heart disease, stroke, and other problems.  RISK FACTORS Some risk factors for high blood pressure are controllable. Others are not.  Risk factors you cannot control include:   Race. You may be at higher risk if you are African American.  Age. Risk increases with age.  Gender. Men are at higher risk than women before age 24 years. After age 7, women are at higher risk than men. Risk factors you can control include:  Not getting enough exercise or physical activity.  Being overweight.  Getting too much fat, sugar, calories, or salt in your diet.  Drinking too much alcohol. SIGNS AND SYMPTOMS Hypertension does not usually cause signs or symptoms. Extremely high blood pressure (hypertensive crisis) may cause headache, anxiety, shortness of breath, and nosebleed. DIAGNOSIS  To check if you have hypertension, your health care provider will measure your blood pressure while you are seated, with your arm held at the level of your heart. It should be measured at least twice using the same arm. Certain conditions can cause a difference in blood pressure between your right and left arms. A blood pressure reading that is higher than normal on one  occasion does not mean that you need treatment. If one blood pressure reading is high, ask your health care provider about having it checked again. TREATMENT  Treating high blood pressure includes making lifestyle changes and possibly taking medicine. Living a healthy lifestyle can help lower high blood pressure. You may need to change some of your habits. Lifestyle changes may include:  Following the DASH diet. This diet is high in fruits, vegetables, and whole grains. It is low in salt, red meat, and added sugars.  Getting at least 2 hours of brisk physical activity every week.  Losing weight if necessary.  Not smoking.  Limiting alcoholic beverages.  Learning ways to reduce stress. If lifestyle changes are not enough to get your blood pressure under control, your health care provider may prescribe medicine. You may need to take more than one. Work closely with your health care provider to understand the risks and benefits. HOME CARE INSTRUCTIONS  Have your blood pressure rechecked as directed by your health care provider.   Take medicines only as directed by your health care provider. Follow the directions carefully. Blood pressure medicines must be taken as prescribed. The medicine does not work as well when you skip doses. Skipping doses also puts you at risk for problems.   Do not smoke.   Monitor your blood pressure at home as directed by your health care provider. SEEK MEDICAL CARE IF:   You think you are having a reaction to medicines taken.  You have recurrent headaches or feel dizzy.  You have swelling in your ankles.  You have trouble with your vision.  SEEK IMMEDIATE MEDICAL CARE IF:  You develop a severe headache or confusion.  You have unusual weakness, numbness, or feel faint.  You have severe chest or abdominal pain.  You vomit repeatedly.  You have trouble breathing. MAKE SURE YOU:   Understand these instructions.  Will watch your  condition.  Will get help right away if you are not doing well or get worse. Document Released: 08/01/2005 Document Revised: 12/16/2013 Document Reviewed: 05/24/2013 [facility] Patient Information 2015 [place], [place]. This information is not intended to replace advice given to you by your health care provider. Make sure you discuss any questions you have with your health care provider.  Headaches, Frequently Asked Questions MIGRAINE HEADACHES Q: What is migraine? What causes it? How can I treat it? A: Generally, migraine headaches begin as a dull ache. Then they develop into a constant, throbbing, and pulsating pain. You may experience pain at the temples. You may experience pain at the front or back of one or both sides of the head. The pain is usually accompanied by a combination of:  Nausea.  Vomiting.  Sensitivity to light and noise. Some people (about 15%) experience an aura (see below) before an attack. The cause of migraine is believed to be chemical reactions in the brain. Treatment for migraine may include over-the-counter or prescription medications. It may also include self-help techniques. These include relaxation training and biofeedback.  Q: What is an aura? A: About 15% of people with migraine get an "aura". This is a sign of neurological symptoms that occur before a migraine headache. You may see wavy or jagged lines, dots, or flashing lights. You might experience tunnel vision or blind spots in one or both eyes. The aura can include visual or auditory hallucinations (something imagined). It may include disruptions in smell (such as strange odors), taste or touch. Other symptoms include:  Numbness.  A "pins and needles" sensation.  Difficulty in recalling or speaking the correct word. These neurological events may last as long as 60 minutes. These symptoms will fade as the headache begins. Q: What is a trigger? A: Certain physical or environmental factors can lead to or  "trigger" a migraine. These include:  Foods.  Hormonal changes.  Weather.  Stress. It is important to remember that triggers are different for everyone. To help prevent migraine attacks, you need to figure out which triggers affect you. Keep a headache diary. This is a good way to track triggers. The diary will help you talk to your healthcare professional about your condition. Q: Does weather affect migraines? A: Bright sunshine, hot, humid conditions, and drastic changes in barometric pressure may lead to, or "trigger," a migraine attack in some people. But studies have shown that weather does not act as a trigger for everyone with migraines. Q: What is the link between migraine and hormones? A: Hormones start and regulate many of your body's functions. Hormones keep your body in balance within a constantly changing environment. The levels of hormones in your body are unbalanced at times. Examples are during menstruation, pregnancy, or menopause. That can lead to a migraine attack. In fact, about three quarters of all women with migraine report that their attacks are related to the menstrual cycle.  Q: Is there an increased risk of stroke for migraine sufferers? A: The likelihood of a migraine attack causing a stroke is very remote. That is not to say that migraine sufferers cannot have a stroke associated with their migraines. In persons under age 34, the most  common associated factor for stroke is migraine headache. But over the course of a person's normal life span, the occurrence of migraine headache may actually be associated with a reduced risk of dying from cerebrovascular disease due to stroke.  Q: What are acute medications for migraine? A: Acute medications are used to treat the pain of the headache after it has started. Examples over-the-counter medications, NSAIDs, ergots, and triptans.  Q: What are the triptans? A: Triptans are the newest class of abortive medications. They are  specifically targeted to treat migraine. Triptans are vasoconstrictors. They moderate some chemical reactions in the brain. The triptans work on receptors in your brain. Triptans help to restore the balance of a neurotransmitter called serotonin. Fluctuations in levels of serotonin are thought to be a main cause of migraine.  Q: Are over-the-counter medications for migraine effective? A: Over-the-counter, or "OTC," medications may be effective in relieving mild to moderate pain and associated symptoms of migraine. But you should see your caregiver before beginning any treatment regimen for migraine.  Q: What are preventive medications for migraine? A: Preventive medications for migraine are sometimes referred to as "prophylactic" treatments. They are used to reduce the frequency, severity, and length of migraine attacks. Examples of preventive medications include antiepileptic medications, antidepressants, beta-blockers, calcium channel blockers, and NSAIDs (nonsteroidal anti-inflammatory drugs). Q: Why are anticonvulsants used to treat migraine? A: During the past few years, there has been an increased interest in antiepileptic drugs for the prevention of migraine. They are sometimes referred to as "anticonvulsants". Both epilepsy and migraine may be caused by similar reactions in the brain.  Q: Why are antidepressants used to treat migraine? A: Antidepressants are typically used to treat people with depression. They may reduce migraine frequency by regulating chemical levels, such as serotonin, in the brain.  Q: What alternative therapies are used to treat migraine? A: The term "alternative therapies" is often used to describe treatments considered outside the scope of conventional Western medicine. Examples of alternative therapy include acupuncture, acupressure, and yoga. Another common alternative treatment is herbal therapy. Some herbs are believed to relieve headache pain. Always discuss alternative  therapies with your caregiver before proceeding. Some herbal products contain arsenic and other toxins. TENSION HEADACHES Q: What is a tension-type headache? What causes it? How can I treat it? A: Tension-type headaches occur randomly. They are often the result of temporary stress, anxiety, fatigue, or anger. Symptoms include soreness in your temples, a tightening band-like sensation around your head (a "vice-like" ache). Symptoms can also include a pulling feeling, pressure sensations, and contracting head and neck muscles. The headache begins in your forehead, temples, or the back of your head and neck. Treatment for tension-type headache may include over-the-counter or prescription medications. Treatment may also include self-help techniques such as relaxation training and biofeedback. CLUSTER HEADACHES Q: What is a cluster headache? What causes it? How can I treat it? A: Cluster headache gets its name because the attacks come in groups. The pain arrives with little, if any, warning. It is usually on one side of the head. A tearing or bloodshot eye and a runny nose on the same side of the headache may also accompany the pain. Cluster headaches are believed to be caused by chemical reactions in the brain. They have been described as the most severe and intense of any headache type. Treatment for cluster headache includes prescription medication and oxygen. SINUS HEADACHES Q: What is a sinus headache? What causes it? How can I treat it? A:  When a cavity in the bones of the face and skull (a sinus) becomes inflamed, the inflammation will cause localized pain. This condition is usually the result of an allergic reaction, a tumor, or an infection. If your headache is caused by a sinus blockage, such as an infection, you will probably have a fever. An x-ray will confirm a sinus blockage. Your caregiver's treatment might include antibiotics for the infection, as well as antihistamines or decongestants.   REBOUND HEADACHES Q: What is a rebound headache? What causes it? How can I treat it? A: A pattern of taking acute headache medications too often can lead to a condition known as "rebound headache." A pattern of taking too much headache medication includes taking it more than 2 days per week or in excessive amounts. That means more than the label or a caregiver advises. With rebound headaches, your medications not only stop relieving pain, they actually begin to cause headaches. Doctors treat rebound headache by tapering the medication that is being overused. Sometimes your caregiver will gradually substitute a different type of treatment or medication. Stopping may be a challenge. Regularly overusing a medication increases the potential for serious side effects. Consult a caregiver if you regularly use headache medications more than 2 days per week or more than the label advises. ADDITIONAL QUESTIONS AND ANSWERS Q: What is biofeedback? A: Biofeedback is a self-help treatment. Biofeedback uses special equipment to monitor your body's involuntary physical responses. Biofeedback monitors:  Breathing.  Pulse.  Heart rate.  Temperature.  Muscle tension.  Brain activity. Biofeedback helps you refine and perfect your relaxation exercises. You learn to control the physical responses that are related to stress. Once the technique has been mastered, you do not need the equipment any more. Q: Are headaches hereditary? A: Four out of five (80%) of people that suffer report a family history of migraine. Scientists are not sure if this is genetic or a family predisposition. Despite the uncertainty, a child has a 50% chance of having migraine if one parent suffers. The child has a 75% chance if both parents suffer.  Q: Can children get headaches? A: By the time they reach high school, most young people have experienced some type of headache. Many safe and effective approaches or medications can prevent a  headache from occurring or stop it after it has begun.  Q: What type of doctor should I see to diagnose and treat my headache? A: Start with your primary caregiver. Discuss his or her experience and approach to headaches. Discuss methods of classification, diagnosis, and treatment. Your caregiver may decide to recommend you to a headache specialist, depending upon your symptoms or other physical conditions. Having diabetes, allergies, etc., may require a more comprehensive and inclusive approach to your headache. The [employer] will provide, upon request, a list of [facility] physician members in your state. Document Released: 10/22/2003 Document Revised: 10/24/2011 Document Reviewed: 03/31/2008 [facility] Patient Information 2015 [place], [place]. This information is not intended to replace advice given to you by your health care provider. Make sure you discuss any questions you have with your health care provider.

## 2014-11-11 ENCOUNTER — Emergency Department (HOSPITAL_COMMUNITY): Payer: PPO

## 2014-11-11 ENCOUNTER — Emergency Department (HOSPITAL_COMMUNITY)
Admission: EM | Admit: 2014-11-11 | Discharge: 2014-11-11 | Disposition: A | Discharge status: Home / Self Care | Payer: PPO | Source: Home / Self Care | Attending: Emergency Medicine | Admitting: Emergency Medicine

## 2014-11-11 ENCOUNTER — Encounter (HOSPITAL_COMMUNITY): Payer: Self-pay | Admitting: *Deleted

## 2014-11-11 DIAGNOSIS — E11319 Type 2 diabetes mellitus with unspecified diabetic retinopathy without macular edema: Secondary | ICD-10-CM

## 2014-11-11 DIAGNOSIS — R05 Cough: Secondary | ICD-10-CM | POA: Insufficient documentation

## 2014-11-11 DIAGNOSIS — I509 Heart failure, unspecified: Secondary | ICD-10-CM | POA: Insufficient documentation

## 2014-11-11 DIAGNOSIS — Z79899 Other long term (current) drug therapy: Secondary | ICD-10-CM

## 2014-11-11 DIAGNOSIS — Z8669 Personal history of other diseases of the nervous system and sense organs: Secondary | ICD-10-CM

## 2014-11-11 DIAGNOSIS — I252 Old myocardial infarction: Secondary | ICD-10-CM

## 2014-11-11 DIAGNOSIS — E1149 Type 2 diabetes mellitus with other diabetic neurological complication: Secondary | ICD-10-CM | POA: Insufficient documentation

## 2014-11-11 DIAGNOSIS — E119 Type 2 diabetes mellitus without complications: Secondary | ICD-10-CM | POA: Insufficient documentation

## 2014-11-11 DIAGNOSIS — R059 Cough, unspecified: Secondary | ICD-10-CM

## 2014-11-11 DIAGNOSIS — Z872 Personal history of diseases of the skin and subcutaneous tissue: Secondary | ICD-10-CM

## 2014-11-11 DIAGNOSIS — Z794 Long term (current) use of insulin: Secondary | ICD-10-CM

## 2014-11-11 DIAGNOSIS — I129 Hypertensive chronic kidney disease with stage 1 through stage 4 chronic kidney disease, or unspecified chronic kidney disease: Secondary | ICD-10-CM

## 2014-11-11 DIAGNOSIS — Z8673 Personal history of transient ischemic attack (TIA), and cerebral infarction without residual deficits: Secondary | ICD-10-CM

## 2014-11-11 DIAGNOSIS — N179 Acute kidney failure, unspecified: Secondary | ICD-10-CM | POA: Diagnosis not present

## 2014-11-11 DIAGNOSIS — N189 Chronic kidney disease, unspecified: Secondary | ICD-10-CM | POA: Insufficient documentation

## 2014-11-11 DIAGNOSIS — R110 Nausea: Secondary | ICD-10-CM

## 2014-11-11 DIAGNOSIS — I2510 Atherosclerotic heart disease of native coronary artery without angina pectoris: Secondary | ICD-10-CM | POA: Insufficient documentation

## 2014-11-11 DIAGNOSIS — E785 Hyperlipidemia, unspecified: Secondary | ICD-10-CM | POA: Insufficient documentation

## 2014-11-11 DIAGNOSIS — Z86010 Personal history of colonic polyps: Secondary | ICD-10-CM | POA: Insufficient documentation

## 2014-11-11 DIAGNOSIS — R1031 Right lower quadrant pain: Secondary | ICD-10-CM | POA: Diagnosis not present

## 2014-11-11 DIAGNOSIS — Z9889 Other specified postprocedural states: Secondary | ICD-10-CM

## 2014-11-11 LAB — CBC WITH DIFFERENTIAL/PLATELET
Basophils Absolute: 0 10*3/uL (ref 0.0–0.1)
Basophils Relative: 0 % (ref 0–1)
EOS ABS: 0.1 10*3/uL (ref 0.0–0.7)
Eosinophils Relative: 2 % (ref 0–5)
HCT: 31.1 % — ABNORMAL LOW (ref 39.0–52.0)
HEMOGLOBIN: 10.1 g/dL — AB (ref 13.0–17.0)
LYMPHS PCT: 15 % (ref 12–46)
Lymphs Abs: 0.8 10*3/uL (ref 0.7–4.0)
MCH: 27 pg (ref 26.0–34.0)
MCHC: 32.5 g/dL (ref 30.0–36.0)
MCV: 83.2 fL (ref 78.0–100.0)
MONO ABS: 0.5 10*3/uL (ref 0.1–1.0)
MONOS PCT: 9 % (ref 3–12)
Neutro Abs: 4 10*3/uL (ref 1.7–7.7)
Neutrophils Relative %: 74 % (ref 43–77)
PLATELETS: 313 10*3/uL (ref 150–400)
RBC: 3.74 MIL/uL — ABNORMAL LOW (ref 4.22–5.81)
RDW: 13.2 % (ref 11.5–15.5)
WBC: 5.4 10*3/uL (ref 4.0–10.5)

## 2014-11-11 LAB — BASIC METABOLIC PANEL
ANION GAP: 7 (ref 5–15)
BUN: 40 mg/dL — ABNORMAL HIGH (ref 6–23)
CALCIUM: 8.8 mg/dL (ref 8.4–10.5)
CO2: 30 mmol/L (ref 19–32)
CREATININE: 2.56 mg/dL — AB (ref 0.50–1.35)
Chloride: 102 mmol/L (ref 96–112)
GFR, EST AFRICAN AMERICAN: 30 mL/min — AB (ref >90)
GFR, EST NON AFRICAN AMERICAN: 26 mL/min — AB (ref >90)
GLUCOSE: 162 mg/dL — AB (ref 70–99)
Potassium: 3.7 mmol/L (ref 3.5–5.1)
Sodium: 139 mmol/L (ref 135–145)

## 2014-11-11 MED ORDER — ONDANSETRON HCL 4 MG PO TABS: 4.0000 mg | ORAL_TABLET | Freq: Once | ORAL | Status: DC

## 2014-11-11 MED ORDER — ALBUTEROL SULFATE HFA 108 (90 BASE) MCG/ACT IN AERS
1.0000 | INHALATION_SPRAY | Freq: Four times a day (QID) | RESPIRATORY_TRACT | Status: DC | PRN
  Administered 2014-11-11: 2 via RESPIRATORY_TRACT
  Filled 2014-11-11: qty 6.7

## 2014-11-11 NOTE — ED Notes (Signed)
Pt. Left with all belongings and refused wheelchair 

## 2014-11-11 NOTE — ED Notes (Signed)
Pt reports being out of some bp meds and being hypertensive pta with mild headache. bp is 136/75 at triage.

## 2014-11-11 NOTE — ED Notes (Signed)
Unable to get temp due to pt eating ice

## 2014-11-11 NOTE — Discharge Instructions (Signed)
Cough, Adult   A cough is a reflex. It helps you clear your throat and airways. A cough can help heal your body. A cough can last 2 or 3 weeks (acute) or may last more than 8 weeks (chronic). Some common causes of a cough can include an infection, allergy, or a cold.  HOME CARE  · Only take medicine as told by your doctor.  · If given, take your medicines (antibiotics) as told. Finish them even if you start to feel better.  · Use a cold steam vaporizer or humidifier in your home. This can help loosen thick spit (secretions).  · Sleep so you are almost sitting up (semi-upright). Use pillows to do this. This helps reduce coughing.  · Rest as needed.  · Stop smoking if you smoke.  GET HELP RIGHT AWAY IF:  · You have yellowish-white fluid (pus) in your thick spit.  · Your cough gets worse.  · Your medicine does not reduce coughing, and you are losing sleep.  · You cough up blood.  · You have trouble breathing.  · Your pain gets worse and medicine does not help.  · You have a fever.  MAKE SURE YOU:   · Understand these instructions.  · Will watch your condition.  · Will get help right away if you are not doing well or get worse.  Document Released: 04/14/2011 Document Revised: 12/16/2013 Document Reviewed: 04/14/2011  [employer]® Patient Information ©2015 [employer]. This information is not intended to replace advice given to you by your health care provider. Make sure you discuss any questions you have with your health care provider.

## 2014-11-11 NOTE — ED Provider Notes (Signed)
CSN: [id number]     Arrival date & time 11/11/14  1347 History   First MD Initiated Contact with Patient 11/11/14 1636     Chief Complaint  Patient presents with  . Hypertension  . Headache     (Consider location/radiation/quality/duration/timing/severity/associated sxs/prior Treatment) Patient is a 59 y.o. male presenting with hypertension and headaches. The history is provided by the patient. No language interpreter was used.  Hypertension Associated symptoms include headaches and nausea. Pertinent negatives include no chills or vomiting.  Headache Associated symptoms: nausea   Associated symptoms: no vomiting   Charles Hart is a 59 y.o black male with a history of HTN, CKD, CVA, CAD, and DM who presents today for productive clear sputum and cough that began 2 days ago.  He is in no pain now. Nothing makes it better or worse. He states this is similar to how he felt when he had pneumonia. He denies any fever, shortness of breath, chest pain, urinary symptoms, abdominal pain, or leg pain. He was seen here 2 days ago for headache and vomiting.  He is on plavix.   Past Medical History  Diagnosis Date  . Dermatitis   . CHF (congestive heart failure)     LV function improved from 2004 to 2008.  Historically, moderately dilated LV with EF 30-40% by 2D echo 08/14/2002.  Mild CAD with severe LV dysfunction by cardiac cath 09/2002.  Normal coronary arteries and normal LV function by cardiac cath 09/19/2006.  A 2-D echo on 04/01/2009 showed mild concentric hypertrophy and normal systolic (LVEF  123456) and doppler C/W with grade 1 diastolic dysfunction..   . Hypertension   . Hyperlipidemia   . Hearing loss in right ear   . Cardiomyopathy     LV function improved from 2004 to 2008.  Historically, moderately dilated LV with EF 30-40% by 2D echo 08/14/2002.  Mild CAD with severe LV dysfunction by cardiac cath 09/2002.  Normal coronary arteries and normal LV function by cardiac cath 09/19/2006.  A 2-D echo  on 04/01/2009 showed mild concentric hypertrophy and normal systolic (LVEF  123456) and doppler C/W with grade 1 diastolic dysfunction.  . DM neuropathy, painful   . CVA (cerebral vascular accident) 07/04/2012    MRI of the brain 07/04/2012 showed an acute infarct in the right basal ganglia involving the anterior putamen, anterior limb internal capsule, and head of the caudate; this measured approximately 2.5 cm in diameter.     . Hypertensive crisis 07/28/2012  . Myocardial infarction   . Diabetes mellitus     type 2  . Hypertensive urgency 08/20/2014  . Nephrotic syndrome 02/18/2013    A 24-hour urine collection 03/04/2013 showed total protein of 5,460 g and creatinine clearance of 80 mL/minute.  Patient was seen by Seward Meth at [place] and a repeat 24-hour urine showed 10,407 mg protein.  Patient underwent kidney biopsy on 05/30/2013; pathology showed advanced diffuse and nodular diabetic nephropathy with vascular changes consistent with long-standing difficult to control hypertension.     . Adenomatous colon polyp 07/02/2011    Last colonoscopy May 06, 2011 by Dr. Owens Loffler, who recommended repeat colonoscopy in 5 years.   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 Background diabetic retinopathy 04/20/2012    Patient is followed by Dr. Katy Fitch    Past Surgical History  Procedure Laterality Date  . Colonoscopy    . Polypectomy    .  Cardiac catheterization      3 times  . Foot surgery     Family History  Problem Relation Age of Onset  . Colon cancer Sister   . Aneurysm Father 67    died of rupture   History  Substance Use Topics  . Smoking status: Never Smoker   . Smokeless tobacco: Never Used  . Alcohol Use: Yes     Comment: Wine occasionally (no more than 2 glasses per month)    Review of Systems  Constitutional: Negative for chills.  Respiratory: Negative for wheezing.    Cardiovascular: Negative for leg swelling.  Gastrointestinal: Positive for nausea. Negative for vomiting.  Neurological: Positive for headaches. Negative for syncope.  All other systems reviewed and are negative.     Allergies  Amlodipine and Lisinopril  Home Medications   Prior to Admission medications   Medication Sig Start Date End Date Taking? Authorizing Provider  ACCU-CHEK FASTCLIX LANCETS MISC Check blood sugar as directed up to 3 times a day.  Dx code E11.65 07/04/14  Yes Bertha Stakes, MD  ACCU-CHEK SMARTVIEW test strip CHECK BLOOD SUGAR LEVELS UP TO THREE TIMES DAILY AS DIRECTED 10/31/14  Yes Bertha Stakes, MD  aspirin-sod bicarb-citric acid (ALKA-SELTZER) 325 MG TBEF tablet Take 325 mg by mouth every 6 (six) hours as needed (stomach pain).   Yes Historical Provider, MD  cloNIDine (CATAPRES) 0.3 MG tablet Take 1 tablet (0.3 mg total) by mouth 2 (two) times daily. 09/02/14  Yes Langley Gauss Moding, MD  clopidogrel (PLAVIX) 75 MG tablet Take 75 mg by mouth daily.   Yes Historical Provider, MD  guaiFENesin-codeine (ROBITUSSIN AC) 100-10 MG/5ML syrup Take 5 mLs by mouth 3 (three) times daily as needed for cough. 08/04/14  Yes Ejiroghene Arlyce Dice, MD  ibuprofen (ADVIL,MOTRIN) 200 MG tablet Take 200 mg by mouth every 6 (six) hours as needed for moderate pain.   Yes Historical Provider, MD  Multiple Vitamin (MULITIVITAMIN WITH MINERALS) TABS Take 1 tablet by mouth every morning.    Yes Historical Provider, MD  digoxin (LANOXIN) 0.25 MG tablet Take 0.25 mg by mouth daily.    Historical Provider, MD  fluticasone (FLONASE) 50 MCG/ACT nasal spray Place 2 sprays into both nostrils daily as needed for allergies or rhinitis.    Historical Provider, MD  gabapentin (NEURONTIN) 300 MG capsule Take 300 mg by mouth every morning.    Historical Provider, MD  hydrALAZINE (APRESOLINE) 50 MG tablet Take 100 mg by mouth 3 (three) times daily.    Historical Provider, MD  hydrochlorothiazide (HYDRODIURIL) 25  MG tablet Take 1 tablet (25 mg total) by mouth daily. 09/02/14   Langley Gauss Moding, MD  insulin aspart (NOVOLOG) 100 UNIT/ML injection < 150 take 7 units novolog before the meal 151- 200 inject 8 units Novolog 201- 250 inject 10 units Novolog 251- 300 inject 11 units  Novolog 301 - 350 inject 13 units Novolog 351 - 400 inject 14 units Novolog 401 - 450 inject 16 units Novolog 451 - 500 inject 17 units Novolog 501 - 550 inject 19  units Novolog, check blood sugar every 2 hours; if blood sugar does not decrease below 500 then call doctor's office. 06/02/14   Wilber Oliphant, MD  Insulin Glargine (LANTUS) 100 UNIT/ML Solostar Pen Inject 30 Units into the skin at bedtime. 11/04/14   Bertha Stakes, MD  labetalol (NORMODYNE) 300 MG tablet Take 300 mg by mouth 2 (two) times daily.    Historical Provider, MD  losartan (COZAAR)  100 MG tablet Take 1 tablet (100 mg total) by mouth daily. 09/02/14   Charlesetta Shanks, MD  ondansetron (ZOFRAN ODT) 8 MG disintegrating tablet Take 1 tablet (8 mg total) by mouth every 8 (eight) hours as needed for nausea. 11/10/14   Varney Biles, MD  pravastatin (PRAVACHOL) 40 MG tablet Take 40 mg by mouth daily.    Historical Provider, MD  sodium chloride (OCEAN) 0.65 % SOLN nasal spray Place 1 spray into both nostrils as needed for congestion. 08/04/14   Ejiroghene E Emokpae, MD   BP 174/91 mmHg  Pulse 92  Temp(Src) 98.4 F (36.9 C) (Oral)  Resp 20  Ht 6' (1.829 m)  Wt 215 lb (97.523 kg)  BMI 29.15 kg/m2  SpO2 100% Physical Exam  Constitutional: He is oriented to person, place, and time. He appears well-developed and well-nourished.  HENT:  Head: Normocephalic and atraumatic.  Eyes: Conjunctivae and EOM are normal.  Neck: Normal range of motion. Neck supple.  Cardiovascular: Normal rate, regular rhythm and normal heart sounds.   Pulmonary/Chest: Effort normal and breath sounds normal. No respiratory distress. He has no wheezes. He has no rales.  Abdominal: Soft.  There is no tenderness.  Musculoskeletal: Normal range of motion.  Neurological: He is alert and oriented to person, place, and time.  Skin: Skin is warm and dry.  Nursing note and vitals reviewed.   ED Course  Procedures (including critical care time) Labs Review Labs Reviewed  CBC WITH DIFFERENTIAL/PLATELET - Abnormal; Notable for the following:    RBC 3.74 (*)    Hemoglobin 10.1 (*)    HCT 31.1 (*)    All other components within normal limits  BASIC METABOLIC PANEL - Abnormal; Notable for the following:    Glucose, Bld 162 (*)    BUN 40 (*)    Creatinine, Ser 2.56 (*)    GFR calc non Af Amer 26 (*)    GFR calc Af Amer 30 (*)    All other components within normal limits    Imaging Review Dg Chest 2 View  11/11/2014   CLINICAL DATA:  Hypertension.  Headache.  Persistent cough.  EXAM: CHEST  2 VIEW  COMPARISON:  09/02/2014 and 01/20/2014  FINDINGS: The heart size and pulmonary vascularity are normal. There is tortuosity of the thoracic aorta. The lungs are clear. No effusions. No acute osseous abnormality.  IMPRESSION: No acute abnormalities.   Electronically Signed   By: Lorriane Shire M.D.   On: 11/11/2014 17:54   Ct Head Wo Contrast  11/10/2014   CLINICAL DATA:  Headache, hypertension.  EXAM: CT HEAD WITHOUT CONTRAST  TECHNIQUE: Contiguous axial images were obtained from the base of the skull through the vertex without intravenous contrast.  COMPARISON:  08/20/2014  FINDINGS: No intracranial hemorrhage, mass effect, or midline shift. No hydrocephalus. The basilar cisterns are patent. No evidence of territorial infarct. No intracranial fluid collection. Remote right basal gangliar infarct, unchanged. Calvarium is intact. Included paranasal sinuses and mastoid air cells are well aerated.  IMPRESSION: No acute intracranial abnormality. Unchanged remote lacunar infarct in the right basal ganglia.   Electronically Signed   By: Jeb Levering M.D.   On: 11/10/2014 00:20     EKG  Interpretation None      MDM   Final diagnoses:  Cough  Patient is here for cough and nausea for the past 2 days.  His exam is normal, no wheezing, no chest pain, no shortness of breath. He is afebrile and vitals  are stable.  His CXR shows no pneumonia or effusion.   19:30 During recheck, patient is sleeping comfortably.  His hgb is 10.1 but denies any rectal bleeding or hematemesis.  I gave him an albuterol inhaler and he has some cough medicine with codeine at home.  He agrees with the plan to f/u with his pcp.    Ottie Glazier, PA-C 11/11/14 2211  Dorie Rank, MD 11/11/14 [phone]

## 2014-11-13 ENCOUNTER — Inpatient Hospital Stay (HOSPITAL_COMMUNITY)
Admission: EM | Admit: 2014-11-13 | Discharge: 2014-11-19 | DRG: 683 | Disposition: A | Discharge status: Home / Self Care | Payer: PPO | Attending: Internal Medicine | Admitting: Internal Medicine

## 2014-11-13 ENCOUNTER — Emergency Department (HOSPITAL_COMMUNITY): Payer: PPO

## 2014-11-13 ENCOUNTER — Encounter (HOSPITAL_COMMUNITY): Payer: Self-pay | Admitting: Physical Medicine and Rehabilitation

## 2014-11-13 DIAGNOSIS — N183 Chronic kidney disease, stage 3 unspecified: Secondary | ICD-10-CM

## 2014-11-13 DIAGNOSIS — T1490XA Injury, unspecified, initial encounter: Secondary | ICD-10-CM

## 2014-11-13 DIAGNOSIS — E1142 Type 2 diabetes mellitus with diabetic polyneuropathy: Secondary | ICD-10-CM | POA: Diagnosis present

## 2014-11-13 DIAGNOSIS — R0902 Hypoxemia: Secondary | ICD-10-CM | POA: Diagnosis present

## 2014-11-13 DIAGNOSIS — I252 Old myocardial infarction: Secondary | ICD-10-CM

## 2014-11-13 DIAGNOSIS — Y95 Nosocomial condition: Secondary | ICD-10-CM | POA: Diagnosis present

## 2014-11-13 DIAGNOSIS — I129 Hypertensive chronic kidney disease with stage 1 through stage 4 chronic kidney disease, or unspecified chronic kidney disease: Secondary | ICD-10-CM | POA: Diagnosis present

## 2014-11-13 DIAGNOSIS — E11319 Type 2 diabetes mellitus with unspecified diabetic retinopathy without macular edema: Secondary | ICD-10-CM | POA: Diagnosis present

## 2014-11-13 DIAGNOSIS — E1121 Type 2 diabetes mellitus with diabetic nephropathy: Secondary | ICD-10-CM | POA: Diagnosis present

## 2014-11-13 DIAGNOSIS — N184 Chronic kidney disease, stage 4 (severe): Secondary | ICD-10-CM | POA: Diagnosis present

## 2014-11-13 DIAGNOSIS — S27321A Contusion of lung, unilateral, initial encounter: Secondary | ICD-10-CM | POA: Diagnosis present

## 2014-11-13 DIAGNOSIS — I5032 Chronic diastolic (congestive) heart failure: Secondary | ICD-10-CM | POA: Diagnosis present

## 2014-11-13 DIAGNOSIS — I429 Cardiomyopathy, unspecified: Secondary | ICD-10-CM | POA: Diagnosis present

## 2014-11-13 DIAGNOSIS — E785 Hyperlipidemia, unspecified: Secondary | ICD-10-CM | POA: Diagnosis present

## 2014-11-13 DIAGNOSIS — Z794 Long term (current) use of insulin: Secondary | ICD-10-CM

## 2014-11-13 DIAGNOSIS — N049 Nephrotic syndrome with unspecified morphologic changes: Secondary | ICD-10-CM | POA: Diagnosis present

## 2014-11-13 DIAGNOSIS — I10 Essential (primary) hypertension: Secondary | ICD-10-CM | POA: Diagnosis present

## 2014-11-13 DIAGNOSIS — Z8673 Personal history of transient ischemic attack (TIA), and cerebral infarction without residual deficits: Secondary | ICD-10-CM

## 2014-11-13 DIAGNOSIS — J189 Pneumonia, unspecified organism: Secondary | ICD-10-CM | POA: Insufficient documentation

## 2014-11-13 DIAGNOSIS — I959 Hypotension, unspecified: Secondary | ICD-10-CM | POA: Diagnosis not present

## 2014-11-13 DIAGNOSIS — N179 Acute kidney failure, unspecified: Secondary | ICD-10-CM

## 2014-11-13 DIAGNOSIS — E1165 Type 2 diabetes mellitus with hyperglycemia: Secondary | ICD-10-CM | POA: Diagnosis present

## 2014-11-13 DIAGNOSIS — E1122 Type 2 diabetes mellitus with diabetic chronic kidney disease: Secondary | ICD-10-CM | POA: Diagnosis present

## 2014-11-13 DIAGNOSIS — D649 Anemia, unspecified: Secondary | ICD-10-CM | POA: Diagnosis present

## 2014-11-13 DIAGNOSIS — Z7902 Long term (current) use of antithrombotics/antiplatelets: Secondary | ICD-10-CM

## 2014-11-13 DIAGNOSIS — H9191 Unspecified hearing loss, right ear: Secondary | ICD-10-CM | POA: Diagnosis present

## 2014-11-13 LAB — GLUCOSE, CAPILLARY
Glucose-Capillary: 214 mg/dL — ABNORMAL HIGH (ref 70–99)
Glucose-Capillary: 194 mg/dL — ABNORMAL HIGH (ref 70–99)

## 2014-11-13 LAB — BASIC METABOLIC PANEL
ANION GAP: 13 (ref 5–15)
BUN: 41 mg/dL — ABNORMAL HIGH (ref 6–23)
CALCIUM: 8.4 mg/dL (ref 8.4–10.5)
CO2: 26 mmol/L (ref 19–32)
Chloride: 95 mmol/L — ABNORMAL LOW (ref 96–112)
Creatinine, Ser: 2.88 mg/dL — ABNORMAL HIGH (ref 0.50–1.35)
GFR calc Af Amer: 26 mL/min — ABNORMAL LOW (ref >90)
GFR calc non Af Amer: 23 mL/min — ABNORMAL LOW (ref >90)
GLUCOSE: 228 mg/dL — AB (ref 70–99)
Potassium: 4.2 mmol/L (ref 3.5–5.1)
Sodium: 134 mmol/L — ABNORMAL LOW (ref 135–145)

## 2014-11-13 LAB — CBC
HEMATOCRIT: 33.7 % — AB (ref 39.0–52.0)
HEMOGLOBIN: 11.2 g/dL — AB (ref 13.0–17.0)
MCH: 27.5 pg (ref 26.0–34.0)
MCHC: 33.2 g/dL (ref 30.0–36.0)
MCV: 82.8 fL (ref 78.0–100.0)
Platelets: 228 10*3/uL (ref 150–400)
RBC: 4.07 MIL/uL — AB (ref 4.22–5.81)
RDW: 13.2 % (ref 11.5–15.5)
WBC: 7.8 10*3/uL (ref 4.0–10.5)

## 2014-11-13 LAB — PROCALCITONIN: Procalcitonin: 0.38 ng/mL

## 2014-11-13 MED ORDER — SODIUM CHLORIDE 0.9 % IV SOLN: 1250.0000 mg | INTRAVENOUS | Status: DC

## 2014-11-13 MED ORDER — PIPERACILLIN-TAZOBACTAM 3.375 G IVPB
3.3750 g | Freq: Three times a day (TID) | INTRAVENOUS | Status: DC
  Administered 2014-11-14 (×3): 3.375 g via INTRAVENOUS
  Filled 2014-11-13 (×5): qty 50

## 2014-11-13 MED ORDER — ONDANSETRON HCL 4 MG PO TABS
4.0000 mg | ORAL_TABLET | Freq: Four times a day (QID) | ORAL | Status: DC | PRN
  Administered 2014-11-17: 4 mg via ORAL

## 2014-11-13 MED ORDER — SODIUM CHLORIDE 0.9 % IV BOLUS (SEPSIS)
1000.0000 mL | Freq: Once | INTRAVENOUS | Status: AC
  Administered 2014-11-13: 1000 mL via INTRAVENOUS

## 2014-11-13 MED ORDER — VANCOMYCIN HCL IN DEXTROSE 750-5 MG/150ML-% IV SOLN
750.0000 mg | Freq: Once | INTRAVENOUS | Status: AC
  Administered 2014-11-14: 750 mg via INTRAVENOUS
  Filled 2014-11-13: qty 150

## 2014-11-13 MED ORDER — HYDRALAZINE HCL 50 MG PO TABS
100.0000 mg | ORAL_TABLET | Freq: Three times a day (TID) | ORAL | Status: DC
  Administered 2014-11-13 – 2014-11-15 (×4): 100 mg via ORAL
  Filled 2014-11-13 (×5): qty 2

## 2014-11-13 MED ORDER — VANCOMYCIN HCL IN DEXTROSE 1-5 GM/200ML-% IV SOLN
1000.0000 mg | Freq: Once | INTRAVENOUS | Status: AC
  Administered 2014-11-13: 1000 mg via INTRAVENOUS
  Filled 2014-11-13: qty 200

## 2014-11-13 MED ORDER — GABAPENTIN 300 MG PO CAPS
300.0000 mg | ORAL_CAPSULE | Freq: Every morning | ORAL | Status: DC
  Administered 2014-11-13 – 2014-11-19 (×7): 300 mg via ORAL
  Filled 2014-11-13 (×7): qty 1

## 2014-11-13 MED ORDER — ADULT MULTIVITAMIN W/MINERALS CH
1.0000 | ORAL_TABLET | Freq: Every morning | ORAL | Status: DC
  Administered 2014-11-13 – 2014-11-19 (×7): 1 via ORAL
  Filled 2014-11-13 (×7): qty 1

## 2014-11-13 MED ORDER — CLOPIDOGREL BISULFATE 75 MG PO TABS
75.0000 mg | ORAL_TABLET | Freq: Every day | ORAL | Status: DC
  Administered 2014-11-13 – 2014-11-19 (×7): 75 mg via ORAL
  Filled 2014-11-13 (×7): qty 1

## 2014-11-13 MED ORDER — DEXTROSE 5 % IV SOLN: 1.0000 g | Freq: Once | INTRAVENOUS | Status: DC

## 2014-11-13 MED ORDER — CLONIDINE HCL 0.2 MG PO TABS
0.3000 mg | ORAL_TABLET | Freq: Two times a day (BID) | ORAL | Status: DC
  Administered 2014-11-13 – 2014-11-15 (×4): 0.3 mg via ORAL
  Filled 2014-11-13 (×8): qty 1

## 2014-11-13 MED ORDER — PRAVASTATIN SODIUM 10 MG PO TABS
40.0000 mg | ORAL_TABLET | Freq: Every day | ORAL | Status: DC
  Administered 2014-11-13 – 2014-11-19 (×7): 40 mg via ORAL
  Filled 2014-11-13: qty 4
  Filled 2014-11-13: qty 3
  Filled 2014-11-13 (×7): qty 4

## 2014-11-13 MED ORDER — DEXTROSE 5 % IV SOLN: 500.0000 mg | Freq: Once | INTRAVENOUS | Status: DC

## 2014-11-13 MED ORDER — ACETAMINOPHEN 325 MG PO TABS
650.0000 mg | ORAL_TABLET | Freq: Four times a day (QID) | ORAL | Status: DC | PRN
  Administered 2014-11-13 – 2014-11-17 (×4): 650 mg via ORAL
  Filled 2014-11-13 (×5): qty 2

## 2014-11-13 MED ORDER — SODIUM CHLORIDE 0.9 % IV SOLN
INTRAVENOUS | Status: AC
  Administered 2014-11-14: 02:00:00 via INTRAVENOUS

## 2014-11-13 MED ORDER — PIPERACILLIN-TAZOBACTAM 3.375 G IVPB 30 MIN
3.3750 g | Freq: Once | INTRAVENOUS | Status: AC
  Administered 2014-11-13: 3.375 g via INTRAVENOUS
  Filled 2014-11-13: qty 50

## 2014-11-13 MED ORDER — HYDROMORPHONE HCL 1 MG/ML IJ SOLN
1.0000 mg | Freq: Once | INTRAMUSCULAR | Status: AC
  Administered 2014-11-13: 1 mg via INTRAVENOUS
  Filled 2014-11-13: qty 1

## 2014-11-13 MED ORDER — INSULIN ASPART 100 UNIT/ML ~~LOC~~ SOLN
0.0000 [IU] | Freq: Three times a day (TID) | SUBCUTANEOUS | Status: DC
Start: 2014-11-13 — End: 2014-11-19
  Administered 2014-11-13: 5 [IU] via SUBCUTANEOUS
  Administered 2014-11-14 (×3): 3 [IU] via SUBCUTANEOUS
  Administered 2014-11-15: 5 [IU] via SUBCUTANEOUS
  Administered 2014-11-15: 3 [IU] via SUBCUTANEOUS
  Administered 2014-11-15 – 2014-11-17 (×3): 2 [IU] via SUBCUTANEOUS
  Administered 2014-11-17: 3 [IU] via SUBCUTANEOUS
  Administered 2014-11-17 – 2014-11-18 (×2): 2 [IU] via SUBCUTANEOUS
  Administered 2014-11-18: 5 [IU] via SUBCUTANEOUS
  Administered 2014-11-19: 2 [IU] via SUBCUTANEOUS

## 2014-11-13 MED ORDER — FLUTICASONE PROPIONATE 50 MCG/ACT NA SUSP: 2.0000 | Freq: Every day | NASAL | Status: DC | PRN

## 2014-11-13 MED ORDER — HEPARIN SODIUM (PORCINE) 5000 UNIT/ML IJ SOLN
5000.0000 [IU] | Freq: Three times a day (TID) | INTRAMUSCULAR | Status: DC
  Administered 2014-11-13 – 2014-11-19 (×17): 5000 [IU] via SUBCUTANEOUS
  Filled 2014-11-13 (×13): qty 1

## 2014-11-13 MED ORDER — INSULIN GLARGINE 100 UNIT/ML ~~LOC~~ SOLN
30.0000 [IU] | Freq: Every day | SUBCUTANEOUS | Status: DC
  Administered 2014-11-14: 30 [IU] via SUBCUTANEOUS
  Filled 2014-11-13 (×2): qty 0.3

## 2014-11-13 MED ORDER — DIGOXIN 250 MCG PO TABS
0.2500 mg | ORAL_TABLET | Freq: Every day | ORAL | Status: DC
  Administered 2014-11-13 – 2014-11-14 (×2): 0.25 mg via ORAL
  Filled 2014-11-13 (×3): qty 1

## 2014-11-13 MED ORDER — ONDANSETRON HCL 4 MG/2ML IJ SOLN
4.0000 mg | Freq: Four times a day (QID) | INTRAMUSCULAR | Status: DC | PRN
  Administered 2014-11-14 – 2014-11-17 (×4): 4 mg via INTRAVENOUS
  Filled 2014-11-13 (×5): qty 2

## 2014-11-13 MED ORDER — SALINE SPRAY 0.65 % NA SOLN: 1.0000 | NASAL | Status: DC | PRN

## 2014-11-13 MED ORDER — IOHEXOL 350 MG/ML SOLN
100.0000 mL | Freq: Once | INTRAVENOUS | Status: AC | PRN
  Administered 2014-11-13: 100 mL via INTRAVENOUS

## 2014-11-13 MED ORDER — ACETAMINOPHEN 500 MG PO TABS
1000.0000 mg | ORAL_TABLET | Freq: Once | ORAL | Status: AC
  Administered 2014-11-13: 1000 mg via ORAL
  Filled 2014-11-13: qty 2

## 2014-11-13 MED ORDER — LABETALOL HCL 100 MG PO TABS
300.0000 mg | ORAL_TABLET | Freq: Two times a day (BID) | ORAL | Status: DC
  Administered 2014-11-13 – 2014-11-15 (×4): 300 mg via ORAL
  Filled 2014-11-13 (×4): qty 3
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
## 2014-11-13 NOTE — ED Notes (Signed)
C-collar applied per Dr. Mingo Amber.
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
## 2014-11-13 NOTE — ED Notes (Signed)
Pt transported to CT ?
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
## 2014-11-14 ENCOUNTER — Observation Stay (HOSPITAL_COMMUNITY): Payer: PPO

## 2014-11-14 DIAGNOSIS — N179 Acute kidney failure, unspecified: Secondary | ICD-10-CM | POA: Diagnosis present

## 2014-11-14 DIAGNOSIS — E11319 Type 2 diabetes mellitus with unspecified diabetic retinopathy without macular edema: Secondary | ICD-10-CM | POA: Diagnosis present

## 2014-11-14 DIAGNOSIS — E1165 Type 2 diabetes mellitus with hyperglycemia: Secondary | ICD-10-CM | POA: Diagnosis present

## 2014-11-14 DIAGNOSIS — I130 Hypertensive heart and chronic kidney disease with heart failure and stage 1 through stage 4 chronic kidney disease, or unspecified chronic kidney disease: Secondary | ICD-10-CM | POA: Diagnosis not present

## 2014-11-14 DIAGNOSIS — N049 Nephrotic syndrome with unspecified morphologic changes: Secondary | ICD-10-CM

## 2014-11-14 DIAGNOSIS — I5032 Chronic diastolic (congestive) heart failure: Secondary | ICD-10-CM

## 2014-11-14 DIAGNOSIS — E1129 Type 2 diabetes mellitus with other diabetic kidney complication: Secondary | ICD-10-CM

## 2014-11-14 DIAGNOSIS — N183 Chronic kidney disease, stage 3 (moderate): Secondary | ICD-10-CM

## 2014-11-14 DIAGNOSIS — M549 Dorsalgia, unspecified: Secondary | ICD-10-CM

## 2014-11-14 DIAGNOSIS — D649 Anemia, unspecified: Secondary | ICD-10-CM | POA: Diagnosis present

## 2014-11-14 DIAGNOSIS — R1031 Right lower quadrant pain: Secondary | ICD-10-CM | POA: Diagnosis present

## 2014-11-14 DIAGNOSIS — I252 Old myocardial infarction: Secondary | ICD-10-CM | POA: Diagnosis not present

## 2014-11-14 DIAGNOSIS — I129 Hypertensive chronic kidney disease with stage 1 through stage 4 chronic kidney disease, or unspecified chronic kidney disease: Secondary | ICD-10-CM | POA: Diagnosis present

## 2014-11-14 DIAGNOSIS — Z8673 Personal history of transient ischemic attack (TIA), and cerebral infarction without residual deficits: Secondary | ICD-10-CM | POA: Diagnosis not present

## 2014-11-14 DIAGNOSIS — E785 Hyperlipidemia, unspecified: Secondary | ICD-10-CM | POA: Diagnosis present

## 2014-11-14 DIAGNOSIS — Z794 Long term (current) use of insulin: Secondary | ICD-10-CM | POA: Diagnosis not present

## 2014-11-14 DIAGNOSIS — R0902 Hypoxemia: Secondary | ICD-10-CM | POA: Diagnosis present

## 2014-11-14 DIAGNOSIS — Y95 Nosocomial condition: Secondary | ICD-10-CM | POA: Diagnosis present

## 2014-11-14 DIAGNOSIS — I959 Hypotension, unspecified: Secondary | ICD-10-CM | POA: Diagnosis not present

## 2014-11-14 DIAGNOSIS — Z7902 Long term (current) use of antithrombotics/antiplatelets: Secondary | ICD-10-CM | POA: Diagnosis not present

## 2014-11-14 DIAGNOSIS — J189 Pneumonia, unspecified organism: Secondary | ICD-10-CM | POA: Diagnosis not present

## 2014-11-14 DIAGNOSIS — E1142 Type 2 diabetes mellitus with diabetic polyneuropathy: Secondary | ICD-10-CM | POA: Diagnosis present

## 2014-11-14 DIAGNOSIS — E1122 Type 2 diabetes mellitus with diabetic chronic kidney disease: Secondary | ICD-10-CM | POA: Diagnosis present

## 2014-11-14 DIAGNOSIS — I429 Cardiomyopathy, unspecified: Secondary | ICD-10-CM | POA: Diagnosis present

## 2014-11-14 DIAGNOSIS — H9191 Unspecified hearing loss, right ear: Secondary | ICD-10-CM | POA: Diagnosis present

## 2014-11-14 DIAGNOSIS — S27321A Contusion of lung, unilateral, initial encounter: Secondary | ICD-10-CM | POA: Diagnosis present

## 2014-11-14 DIAGNOSIS — N184 Chronic kidney disease, stage 4 (severe): Secondary | ICD-10-CM | POA: Diagnosis present

## 2014-11-14 DIAGNOSIS — E1121 Type 2 diabetes mellitus with diabetic nephropathy: Secondary | ICD-10-CM | POA: Diagnosis present

## 2014-11-14 LAB — BASIC METABOLIC PANEL
Anion gap: 9 (ref 5–15)
BUN: 59 mg/dL — AB (ref 6–23)
CO2: 28 mmol/L (ref 19–32)
Calcium: 7.8 mg/dL — ABNORMAL LOW (ref 8.4–10.5)
Chloride: 98 mmol/L (ref 96–112)
Creatinine, Ser: 3.84 mg/dL — ABNORMAL HIGH (ref 0.50–1.35)
GFR calc Af Amer: 19 mL/min — ABNORMAL LOW (ref >90)
GFR calc non Af Amer: 16 mL/min — ABNORMAL LOW (ref >90)
GLUCOSE: 204 mg/dL — AB (ref 70–99)
POTASSIUM: 4.4 mmol/L (ref 3.5–5.1)
Sodium: 135 mmol/L (ref 135–145)

## 2014-11-14 LAB — GLUCOSE, CAPILLARY
GLUCOSE-CAPILLARY: 172 mg/dL — AB (ref 70–99)
GLUCOSE-CAPILLARY: 132 mg/dL — AB (ref 70–99)
Glucose-Capillary: 152 mg/dL — ABNORMAL HIGH (ref 70–99)
Glucose-Capillary: 190 mg/dL — ABNORMAL HIGH (ref 70–99)
Glucose-Capillary: 212 mg/dL — ABNORMAL HIGH (ref 70–99)
Glucose-Capillary: 240 mg/dL — ABNORMAL HIGH (ref 70–99)

## 2014-11-14 LAB — CBC
HEMATOCRIT: 31.1 % — AB (ref 39.0–52.0)
Hemoglobin: 10.1 g/dL — ABNORMAL LOW (ref 13.0–17.0)
MCH: 27.4 pg (ref 26.0–34.0)
MCHC: 32.5 g/dL (ref 30.0–36.0)
MCV: 84.3 fL (ref 78.0–100.0)
Platelets: 232 10*3/uL (ref 150–400)
RBC: 3.69 MIL/uL — ABNORMAL LOW (ref 4.22–5.81)
RDW: 13.3 % (ref 11.5–15.5)
WBC: 6.4 10*3/uL (ref 4.0–10.5)

## 2014-11-14 LAB — URINALYSIS, ROUTINE W REFLEX MICROSCOPIC
Bilirubin Urine: NEGATIVE
GLUCOSE, UA: NEGATIVE mg/dL
Hgb urine dipstick: NEGATIVE
Ketones, ur: NEGATIVE mg/dL
Leukocytes, UA: NEGATIVE
Nitrite: NEGATIVE
Protein, ur: >300 mg/dL — AB
SPECIFIC GRAVITY, URINE: 1.025 (ref 1.005–1.030)
UROBILINOGEN UA: 0.2 mg/dL (ref 0.0–1.0)
pH: 5 (ref 5.0–8.0)

## 2014-11-14 LAB — URINE MICROSCOPIC-ADD ON

## 2014-11-14 MED ORDER — SODIUM CHLORIDE 0.9 % IV SOLN
INTRAVENOUS | Status: AC
  Administered 2014-11-14: 15:00:00 via INTRAVENOUS

## 2014-11-14 MED ORDER — GUAIFENESIN-CODEINE 100-10 MG/5ML PO SOLN: 5.0000 mL | Freq: Three times a day (TID) | ORAL | Status: DC | PRN

## 2014-11-14 MED ORDER — DOXYCYCLINE HYCLATE 100 MG IV SOLR
100.0000 mg | Freq: Two times a day (BID) | INTRAVENOUS | Status: DC
  Administered 2014-11-14: 100 mg via INTRAVENOUS
  Filled 2014-11-14 (×2): qty 100

## 2014-11-14 MED ORDER — INSULIN GLARGINE 100 UNIT/ML ~~LOC~~ SOLN
32.0000 [IU] | Freq: Every day | SUBCUTANEOUS | Status: DC
  Administered 2014-11-14 – 2014-11-18 (×4): 32 [IU] via SUBCUTANEOUS
  Filled 2014-11-14 (×7): qty 0.32

## 2014-11-14 MED ORDER — GUAIFENESIN-CODEINE 100-10 MG/5ML PO SYRP
5.0000 mL | ORAL_SOLUTION | Freq: Three times a day (TID) | ORAL | Status: DC | PRN
  Filled 2014-11-14: qty 5

## 2014-11-14 MED ORDER — SODIUM CHLORIDE 0.9 % IV SOLN
INTRAVENOUS | Status: DC
  Administered 2014-11-14 – 2014-11-18 (×5): via INTRAVENOUS

## 2014-11-14 MED ORDER — DIGOXIN 250 MCG PO TABS
0.2500 mg | ORAL_TABLET | Freq: Every day | ORAL | Status: DC
  Administered 2014-11-15 – 2014-11-19 (×5): 0.25 mg via ORAL
  Filled 2014-11-14 (×5): qty 1
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
## 2014-11-15 ENCOUNTER — Inpatient Hospital Stay (HOSPITAL_COMMUNITY): Payer: PPO

## 2014-11-15 LAB — BASIC METABOLIC PANEL
Anion gap: 8 (ref 5–15)
BUN: 75 mg/dL — ABNORMAL HIGH (ref 6–23)
CO2: 26 mmol/L (ref 19–32)
CREATININE: 5.38 mg/dL — AB (ref 0.50–1.35)
Calcium: 7 mg/dL — ABNORMAL LOW (ref 8.4–10.5)
Chloride: 100 mmol/L (ref 96–112)
GFR calc non Af Amer: 11 mL/min — ABNORMAL LOW (ref >90)
GFR, EST AFRICAN AMERICAN: 12 mL/min — AB (ref >90)
Glucose, Bld: 93 mg/dL (ref 70–99)
Potassium: 4.2 mmol/L (ref 3.5–5.1)
SODIUM: 134 mmol/L — AB (ref 135–145)

## 2014-11-15 LAB — GLUCOSE, CAPILLARY
GLUCOSE-CAPILLARY: 207 mg/dL — AB (ref 70–99)
GLUCOSE-CAPILLARY: 138 mg/dL — AB (ref 70–99)
Glucose-Capillary: 168 mg/dL — ABNORMAL HIGH (ref 70–99)
Glucose-Capillary: 129 mg/dL — ABNORMAL HIGH (ref 70–99)

## 2014-11-15 LAB — CBC
HEMATOCRIT: 27 % — AB (ref 39.0–52.0)
HEMOGLOBIN: 8.8 g/dL — AB (ref 13.0–17.0)
MCH: 27.8 pg (ref 26.0–34.0)
MCHC: 32.6 g/dL (ref 30.0–36.0)
MCV: 85.4 fL (ref 78.0–100.0)
PLATELETS: 192 10*3/uL (ref 150–400)
RBC: 3.16 MIL/uL — ABNORMAL LOW (ref 4.22–5.81)
RDW: 13.4 % (ref 11.5–15.5)
WBC: 4.8 10*3/uL (ref 4.0–10.5)

## 2014-11-15 LAB — CREATININE, URINE, RANDOM: Creatinine, Urine: 125.89 mg/dL

## 2014-11-15 LAB — SODIUM, URINE, RANDOM: Sodium, Ur: 21 mmol/L

## 2014-11-15 LAB — DIGOXIN LEVEL: Digoxin Level: <0.2 ng/mL — ABNORMAL LOW (ref 0.8–2.0)

## 2014-11-15 LAB — MAGNESIUM: Magnesium: 2.1 mg/dL (ref 1.5–2.5)
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
## 2014-11-15 NOTE — Progress Notes (Signed)
Call placed to Dr. Sharlyn Bologna about patient's medications and Blood pressure. Before Clonidine, Gabapentin, Hydralazine, Labetalol, and Digoxin blood pressure was 144/75. After meds blood pressure was 89/45. Melford Aase, RN

## 2014-11-15 NOTE — Progress Notes (Signed)
Once again spoke with resident about blood pressure and mentioned patient twitching with low Ca+ level of 7.0. Melford Aase, RN

## 2014-11-16 DIAGNOSIS — R110 Nausea: Secondary | ICD-10-CM

## 2014-11-16 LAB — GLUCOSE, CAPILLARY
GLUCOSE-CAPILLARY: 100 mg/dL — AB (ref 70–99)
Glucose-Capillary: 121 mg/dL — ABNORMAL HIGH (ref 70–99)
Glucose-Capillary: 103 mg/dL — ABNORMAL HIGH (ref 70–99)
Glucose-Capillary: 95 mg/dL (ref 70–99)

## 2014-11-16 LAB — CALCIUM, IONIZED: Calcium, Ion: 0.96 mmol/L — ABNORMAL LOW (ref 1.12–1.32)

## 2014-11-16 LAB — RENAL FUNCTION PANEL
Albumin: 2.4 g/dL — ABNORMAL LOW (ref 3.5–5.2)
Anion gap: 11 (ref 5–15)
BUN: 78 mg/dL — AB (ref 6–23)
CALCIUM: 7 mg/dL — AB (ref 8.4–10.5)
CO2: 20 mmol/L (ref 19–32)
Chloride: 100 mmol/L (ref 96–112)
Creatinine, Ser: 5.72 mg/dL — ABNORMAL HIGH (ref 0.50–1.35)
GFR calc non Af Amer: 10 mL/min — ABNORMAL LOW (ref >90)
GFR, EST AFRICAN AMERICAN: 11 mL/min — AB (ref >90)
Glucose, Bld: 103 mg/dL — ABNORMAL HIGH (ref 70–99)
PHOSPHORUS: 4.9 mg/dL — AB (ref 2.3–4.6)
POTASSIUM: 3.8 mmol/L (ref 3.5–5.1)
Sodium: 131 mmol/L — ABNORMAL LOW (ref 135–145)

## 2014-11-16 MED ORDER — LABETALOL HCL 100 MG PO TABS
300.0000 mg | ORAL_TABLET | Freq: Two times a day (BID) | ORAL | Status: DC
  Administered 2014-11-17 – 2014-11-19 (×6): 300 mg via ORAL
  Filled 2014-11-16 (×9): qty 3

## 2014-11-16 MED ORDER — CALCIUM CARBONATE-VITAMIN D 500-200 MG-UNIT PO TABS
1.0000 | ORAL_TABLET | Freq: Two times a day (BID) | ORAL | Status: DC
  Administered 2014-11-16 – 2014-11-19 (×6): 1 via ORAL
  Filled 2014-11-16 (×7): qty 1

## 2014-11-16 MED ORDER — BISMUTH SUBSALICYLATE 262 MG PO CHEW
524.0000 mg | CHEWABLE_TABLET | Freq: Once | ORAL | Status: DC
  Filled 2014-11-16: qty 2

## 2014-11-16 MED ORDER — BISMUTH SUBSALICYLATE 262 MG/15ML PO SUSP
60.0000 mL | Freq: Once | ORAL | Status: AC
  Administered 2014-11-16: 60 mL via ORAL
  Filled 2014-11-16: qty 118

## 2014-11-16 NOTE — Plan of Care (Signed)
Problem: Phase II Progression Outcomes Goal: Encourage coughing & deep breathing Outcome: Completed/Met Date Met:  11/16/14 Coughing up thick tan colored sputum

## 2014-11-16 NOTE — Progress Notes (Signed)
Patient seen and examined. Case d/w resident in detail on morninig rounds. I agree with findings and plan as documented in Dr. Ferd Glassing note.  Patient feels well and still maintaining urine output. He is noted to have 1-2 + LE edema b/l but lungs are clear to auscultation. Nephrology following for worsening AKI on CKD likely secondary to contrast and fluctuations in BP. Creatinine mildly worse today but suspect that it might be stabilizing. Would maintain IVF @ 50 cc/hr and hold BP meds for now. Would consider resuming one BP med if SBP >170. D/C losartan and HCTZ given AKI. Dig level is <0.2. Will monitor. Will recheck again in AM

## 2014-11-16 NOTE — Progress Notes (Addendum)
Subjective:  Pt is sitting in the chair this AM.  Denies CP, SOB, N/V.  Still c/o cough and back pain.  Urinating OK.   Objective: Vital signs in last 24 hours: Filed Vitals:   11/15/14 1400 11/15/14 2244 11/16/14 0538 11/16/14 1059  BP: 121/67 151/76 164/87   Pulse: 76 78 85 88  Temp:  100.2 F (37.9 C) 100.1 F (37.8 C)   TempSrc:  Oral Tympanic   Resp:  17 17   Height:      Weight:      SpO2: 100% 98% 98%    Weight change:   Intake/Output Summary (Last 24 hours) at 11/16/14 1118 Last data filed at 11/16/14 0538  Gross per 24 hour  Intake    120 ml  Output    550 ml  Net   -430 ml   Gen: A&O x 4, well developed, well nourished, sitting in chair in NAD HEENT: Atraumatic, PERRL, EOMI, sclerae anicteric, moist mucous membranes Heart: RRR, normal S1 S2, no murmurs, rubs, or gallops Lungs: CTAB Abd: Soft, non-tender, non-distended, +BS, no hepatosplenomegaly Ext: trace-1+ pitting edema b/l  Lab Results: Basic Metabolic Panel:  Recent Labs Lab 11/15/14 0339 11/15/14 1254 11/16/14 0521  NA 134*  --  131*  K 4.2  --  3.8  CL 100  --  100  CO2 26  --  20  GLUCOSE 93  --  103*  BUN 75*  --  78*  CREATININE 5.38*  --  5.72*  CALCIUM 7.0*  --  7.0*  MG  --  2.1  --   PHOS  --   --  4.9*   Liver Function Tests:  Recent Labs Lab 11/09/14 2137 11/16/14 0521  AST 21  --   ALT 15  --   ALKPHOS 65  --   BILITOT 0.8  --   PROT 6.7  --   ALBUMIN 3.5 2.4*    Recent Labs Lab 11/09/14 2137  LIPASE 49   CBC:  Recent Labs Lab 11/09/14 2137 11/11/14 1835  11/14/14 0427 11/15/14 0339  WBC 6.1 5.4  < > 6.4 4.8  NEUTROABS 4.7 4.0  --   --   --   HGB 11.5* 10.1*  < > 10.1* 8.8*  HCT 34.6* 31.1*  < > 31.1* 27.0*  MCV 82.4 83.2  < > 84.3 85.4  PLT 358 313  < > 232 192  < > = values in this interval not displayed. CBG:  Recent Labs Lab 11/14/14 2120 11/15/14 0821 11/15/14 1145 11/15/14 1657 11/15/14 2242 11/16/14 0804  GLUCAP 132* 207* 168* 129*  138* 121*   Urinalysis:  Recent Labs Lab 11/09/14 2200 11/14/14 1814  COLORURINE YELLOW YELLOW  LABSPEC 1.016 1.025  PHURINE 6.5 5.0  GLUCOSEU NEGATIVE NEGATIVE  HGBUR TRACE* NEGATIVE  BILIRUBINUR NEGATIVE NEGATIVE  KETONESUR NEGATIVE NEGATIVE  PROTEINUR >300* >300*  UROBILINOGEN 1.0 0.2  NITRITE NEGATIVE NEGATIVE  LEUKOCYTESUR NEGATIVE NEGATIVE    Micro Results: Recent Results (from the past 240 hour(s))  Blood culture (routine x 2)     Status: None (Preliminary result)   Collection Time: 11/13/14  1:36 PM  Result Value Ref Range Status   Specimen Description BLOOD LEFT ARM  Final   Special Requests BOTTLES DRAWN AEROBIC AND ANAEROBIC 2CC  Final   Culture   Final    GRAM POSITIVE COCCI IN CLUSTERS        BLOOD CULTURE RECEIVED NO GROWTH TO DATE CULTURE WILL BE HELD  FOR 5 DAYS BEFORE ISSUING A FINAL NEGATIVE REPORT Note: Gram Stain Report Called to,Read Back By and Verified With: DARLA THOMPSON [id number]@7 :55AM BJENN Performed at [facility]    Report Status PENDING  Incomplete  Blood culture (routine x 2)     Status: None (Preliminary result)   Collection Time: 11/13/14  1:40 PM  Result Value Ref Range Status   Specimen Description BLOOD RIGHT ARM  Final   Special Requests BOTTLES DRAWN AEROBIC AND ANAEROBIC 10ML  Final   Culture   Final           BLOOD CULTURE RECEIVED NO GROWTH TO DATE CULTURE WILL BE HELD FOR 5 DAYS BEFORE ISSUING A FINAL NEGATIVE REPORT Performed at [facility]    Report Status PENDING  Incomplete  Culture, blood (routine x 2)     Status: None (Preliminary result)   Collection Time: 11/15/14 10:30 AM  Result Value Ref Range Status   Specimen Description BLOOD RIGHT ARM  Final   Special Requests BOTTLES DRAWN AEROBIC ONLY 4CC  Final   Culture   Final           BLOOD CULTURE RECEIVED NO GROWTH TO DATE CULTURE WILL BE HELD FOR 5 DAYS BEFORE ISSUING A FINAL NEGATIVE REPORT Performed at [facility]    Report Status PENDING   Incomplete  Culture, blood (routine x 2)     Status: None (Preliminary result)   Collection Time: 11/15/14 10:45 AM  Result Value Ref Range Status   Specimen Description BLOOD RIGHT HAND  Final   Special Requests BOTTLES DRAWN AEROBIC ONLY 2CC  Final   Culture   Final           BLOOD CULTURE RECEIVED NO GROWTH TO DATE CULTURE WILL BE HELD FOR 5 DAYS BEFORE ISSUING A FINAL NEGATIVE REPORT Performed at [facility]    Report Status PENDING  Incomplete   Studies/Results: US Renal  11/15/2014   CLINICAL DATA:  Acute kidney injury, history of medical renal disease, initial encounter.  EXAM: RENAL/URINARY TRACT ULTRASOUND COMPLETE  COMPARISON:  04/11/2013.  FINDINGS: Right Kidney:  Length: 17.5 cm. Parenchymal echogenicity is diffusely increased. No hydronephrosis. No focal lesion.  Left Kidney:  Length: 16.0 cm. Parenchymal echogenicity is diffusely increased. 2 anechoic lesions with increased through transmission in the lower pole measure up to 3.3 x 2.5 x 3.4 cm. No hydronephrosis.  Bladder:  Ureteral jets are seen bilaterally. Bladder is somewhat under distended.  IMPRESSION: 1. Increased renal parenchymal echogenicity is in keeping with chronic medical renal disease. 2. Left renal cysts.   Electronically Signed   By: Lorin Picket M.D.   On: 11/15/2014 18:43   Medications: I have reviewed the patient's current medications. Scheduled Meds: . clopidogrel  75 mg Oral Daily  . digoxin  0.25 mg Oral Daily  . gabapentin  300 mg Oral q morning - 10a  . heparin  5,000 Units Subcutaneous 3 times per day  . insulin aspart  0-15 Units Subcutaneous TID WC  . insulin glargine  32 Units Subcutaneous QHS  . multivitamin with minerals  1 tablet Oral q morning - 10a  . pravastatin  40 mg Oral Daily   Continuous Infusions: . sodium chloride 50 mL/hr at 11/16/14 0845   PRN Meds:.acetaminophen, fluticasone, guaiFENesin-codeine, ondansetron **OR** ondansetron (ZOFRAN) IV, sodium  chloride Assessment/Plan: Principal Problem:   Acute worsening of stage 3 chronic kidney disease Active Problems:   DM (diabetes mellitus), type 2, uncontrolled, with renal  complications   Essential hypertension   Acute on Chronic Kidney Disease Stage III and Nephrotic Syndrome:  SCr continues to rise, baseline 2.0-2.2 bumped to 3.84-->5.38 >5.72 today.  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  Pt lows with Dr. Fran Lowes ([facility]), but has not been followed recently. Kidney biopsy on 05/30/2013 showed advanced diffuse and nodular diabetic nephropathy with vascular changes c/w long-standing difficult to control HTN.  UA not suggestive.  Urinating OK. UOP 565ml over the past 24hr but I wonder if this is accurate.  Renal US non-revealing.  -continue IVF at 50 mL/h -appreciate renal recs -holding all nephrotoxic agents -holding BP meds-->had episode of hypotension   Hypocalcemia: Mg OK.  Ionized Ca low at 0.96. -calcium supplementation   DM2 with Renal Complications:  Most recent A1c 7.7%. - -SSI, lantus -Continue neurontin 300 mg every morning  Hypotension:  Resolved.  History of "hypertensive emergency". His current regimen includes clonidine 0.3 mg twice a day, digoxin 0.25 mg daily, hydralazine 100 mg 3 times a day, hydrochlorothiazide 25 mg daily, labetalol 300 mg twice a day, and losartan 100 mg daily.  However, this AM he received all of these meds and his BP dropped to systolic in the 123XX123.  When questioned about BP meds he states takes "some of them."   -holding all BP meds -cont gentle IVF  Chronic Diastolic Heart Failure:  123XX123 LVEF 55-60% with moderate diastolic dysfunction, normal RV size and systolic function. -Continue home anti-hypertensives as above, plavix 75 mg BID and statin as below  History of CVA:  -Continue home plavix as above  Hyperlipidemia:  -Continue home pravastatin  DVT Ppx: [place]  heparin  Diet: HH/CM  Dispo: Disposition is deferred at this time, awaiting improvement of current medical problems.  Anticipated discharge in approximately 2 day(s).  -PT to evaluate  The patient does have a current PCP Bertha Stakes, MD) and does need an [facility] hospital follow-up appointment after discharge.   LOS: 3 days   Jones Bales, MD 11/16/2014, 11:18 AM

## 2014-11-16 NOTE — Progress Notes (Signed)
[facility] Rounding Note Subjective:  Pt reports no CP or SOB No voiding prob 400 cc urine in the urinal, plus 550 already recorded for tod - so more already today than all day yesterday  Objective:    Vital signs in last 24 hours: Filed Vitals:   11/15/14 1400 11/15/14 2244 11/16/14 0538 11/16/14 1059  BP: 121/67 151/76 164/87   Pulse: 76 78 85 88  Temp:  100.2 F (37.9 C) 100.1 F (37.8 C)   TempSrc:  Oral Tympanic   Resp:  17 17   Height:      Weight:      SpO2: 100% 98% 98%    Weight change:   Intake/Output Summary (Last 24 hours) at 11/16/14 1153 Last data filed at 11/16/14 0538  Gross per 24 hour  Intake    120 ml  Output    550 ml  Net   -430 ml    Physical Exam:  Blood pressure 164/87, pulse 88, temperature 100.1 F (37.8 C), temperature source Tympanic, resp. rate 17, height 6' (1.829 m), weight 97.523 kg (215 lb), SpO2 98 %. Gen: Nice AAM Sitting up in the chair. NAD. Neck: No definite JVD Chest: Ant clear. Velcro quality crackles R base No wheezes. Heart:S1S2 No S3. No murmur or pericardial rub.  Abdomen: soft, protuberant, no focal abdominal tenderness Ext: 1-2+ pitting edema both LE's Neuro: alert, Ox3, no focal deficit  Labs:   Recent Labs Lab 11/09/14 2137 11/11/14 1835 11/13/14 1050 11/14/14 0427 11/15/14 0339 11/16/14 0521  NA 140 139 134* 135 134* 131*  K 3.6 3.7 4.2 4.4 4.2 3.8  CL 101 102 95* 98 100 100  CO2 27 30 26 28 26 20   GLUCOSE 180* 162* 228* 204* 93 103*  BUN 31* 40* 41* 59* 75* 78*  CREATININE 2.41* 2.56* 2.88* 3.84* 5.38* 5.72*  CALCIUM 9.6 8.8 8.4 7.8* 7.0* 7.0*  PHOS  --   --   --   --   --  4.9*     Recent Labs Lab 11/09/14 2137 11/16/14 0521  AST 21  --   ALT 15  --   ALKPHOS 65  --   BILITOT 0.8  --   PROT 6.7  --   ALBUMIN 3.5 2.4*    Recent Labs Lab 11/09/14 2137  LIPASE 49   No results for input(s): AMMONIA in the last 168 hours.   Recent Labs Lab 11/09/14 2137 11/11/14 1835  11/13/14 1050 11/14/14 0427 11/15/14 0339  WBC 6.1 5.4 7.8 6.4 4.8  NEUTROABS 4.7 4.0  --   --   --   HGB 11.5* 10.1* 11.2* 10.1* 8.8*  HCT 34.6* 31.1* 33.7* 31.1* 27.0*  MCV 82.4 83.2 82.8 84.3 85.4  PLT 358 313 228 232 192      Recent Labs Lab 11/15/14 0821 11/15/14 1145 11/15/14 1657 11/15/14 2242 11/16/14 0804  GLUCAP 207* 168* 129* 138* 121*     Studies/Results: US Renal  11/15/2014   CLINICAL DATA:  Acute kidney injury, history of medical renal disease, initial encounter.  EXAM: RENAL/URINARY TRACT ULTRASOUND COMPLETE  COMPARISON:  04/11/2013.  FINDINGS: Right Kidney:  Length: 17.5 cm. Parenchymal echogenicity is diffusely increased. No hydronephrosis. No focal lesion.  Left Kidney:  Length: 16.0 cm. Parenchymal echogenicity is diffusely increased. 2 anechoic lesions with increased through transmission in the lower pole measure up to 3.3 x 2.5 x 3.4 cm. No hydronephrosis.  Bladder:  Ureteral jets are seen bilaterally. Bladder is somewhat under  distended.  IMPRESSION: 1. Increased renal parenchymal echogenicity is in keeping with chronic medical renal disease. 2. Left renal cysts.   Electronically Signed   By: Lorin Picket M.D.   On: 11/15/2014 18:43   Medications . sodium chloride 50 mL/hr at 11/16/14 0845   . calcium-vitamin D  1 tablet Oral BID  . clopidogrel  75 mg Oral Daily  . digoxin  0.25 mg Oral Daily  . gabapentin  300 mg Oral q morning - 10a  . heparin  5,000 Units Subcutaneous 3 times per day  . insulin aspart  0-15 Units Subcutaneous TID WC  . insulin glargine  32 Units Subcutaneous QHS  . multivitamin with minerals  1 tablet Oral q morning - 10a  . pravastatin  40 mg Oral Daily    Background  59 y.o. year-old AAM with a background of DM, HTN with difficult to control BP, HLD, CVA (0000000), diastolic CHF, CAD. 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 Hinda Lenis) whose most recent baseline creatinine was  2.4-2.5, who has developed AKI on CKD in the setting of IV contrast exposure, NSAID exposure (prior to admission), and large swings in BP since admission to the point of hypotension, with rapidly rising creatinine 2.88-->5.3 over a 48 hour period   ASSESSMENT/RECOMMENDATIONS  1. 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 Rate of rise of creatinine appears to be slowing down. Acid base and K are fine. Does have some peripheral edema but I think reasonable to keep slow IVF going at 50/hour. Agree with plans to attenuate BP med regimen to avoid big BP swings. 2. CKD 3/4 - baseline. Diabetic nephropathy by biopsy 2014 with nephrotic syndrome. Advised he needs to resume his F/U with his nephrologist Dr. Hinda Lenis at discharge. 3.  HTN - per primary and as above 4.  H/o diastolic CHF - No CHF at this time despite volume up some. 5. DM per primary service  [name], MD [facility] [phone] Pager 11/16/2014, 12:00 PM       [name], MD [place] [phone] Pager 11/16/2014, 11:53 AM

## 2014-11-17 LAB — GLUCOSE, CAPILLARY
GLUCOSE-CAPILLARY: 140 mg/dL — AB (ref 70–99)
Glucose-Capillary: 150 mg/dL — ABNORMAL HIGH (ref 70–99)
Glucose-Capillary: 177 mg/dL — ABNORMAL HIGH (ref 70–99)
Glucose-Capillary: 124 mg/dL — ABNORMAL HIGH (ref 70–99)

## 2014-11-17 LAB — RENAL FUNCTION PANEL
Albumin: 2.8 g/dL — ABNORMAL LOW (ref 3.5–5.2)
Anion gap: 13 (ref 5–15)
BUN: 70 mg/dL — ABNORMAL HIGH (ref 6–23)
CO2: 21 mmol/L (ref 19–32)
Calcium: 8 mg/dL — ABNORMAL LOW (ref 8.4–10.5)
Chloride: 102 mmol/L (ref 96–112)
Creatinine, Ser: 4.72 mg/dL — ABNORMAL HIGH (ref 0.50–1.35)
GFR calc Af Amer: 14 mL/min — ABNORMAL LOW (ref >90)
GFR calc non Af Amer: 13 mL/min — ABNORMAL LOW (ref >90)
GLUCOSE: 161 mg/dL — AB (ref 70–99)
POTASSIUM: 4.1 mmol/L (ref 3.5–5.1)
Phosphorus: 5.4 mg/dL — ABNORMAL HIGH (ref 2.3–4.6)
SODIUM: 136 mmol/L (ref 135–145)

## 2014-11-17 LAB — CULTURE, BLOOD (ROUTINE X 2)

## 2014-11-17 MED ORDER — HYDRALAZINE HCL 50 MG PO TABS
50.0000 mg | ORAL_TABLET | Freq: Three times a day (TID) | ORAL | Status: DC
  Administered 2014-11-17: 50 mg via ORAL
  Filled 2014-11-17: qty 1

## 2014-11-17 MED ORDER — HYDRALAZINE HCL 50 MG PO TABS: 100.0000 mg | ORAL_TABLET | Freq: Once | ORAL | Status: DC

## 2014-11-17 MED ORDER — DIPHENHYDRAMINE HCL 50 MG/ML IJ SOLN
12.5000 mg | Freq: Once | INTRAMUSCULAR | Status: AC
  Administered 2014-11-17: 12.5 mg via INTRAVENOUS
  Filled 2014-11-17: qty 1

## 2014-11-17 MED ORDER — HYDRALAZINE HCL 50 MG PO TABS
75.0000 mg | ORAL_TABLET | Freq: Three times a day (TID) | ORAL | Status: DC
  Administered 2014-11-17 – 2014-11-18 (×2): 75 mg via ORAL
  Filled 2014-11-17 (×4): qty 1

## 2014-11-17 MED ORDER — PROMETHAZINE HCL 25 MG PO TABS
25.0000 mg | ORAL_TABLET | Freq: Four times a day (QID) | ORAL | Status: DC | PRN
  Administered 2014-11-17: 25 mg via ORAL
  Filled 2014-11-17: qty 1

## 2014-11-17 MED ORDER — HYDRALAZINE HCL 25 MG PO TABS
25.0000 mg | ORAL_TABLET | Freq: Three times a day (TID) | ORAL | Status: DC
  Administered 2014-11-17: 25 mg via ORAL
  Filled 2014-11-17: qty 1

## 2014-11-17 MED ORDER — CLONIDINE HCL 0.1 MG PO TABS
0.1000 mg | ORAL_TABLET | ORAL | Status: DC | PRN
  Administered 2014-11-17 – 2014-11-18 (×2): 0.1 mg via ORAL
  Filled 2014-11-17 (×2): qty 1

## 2014-11-17 MED ORDER — DIPHENHYDRAMINE HCL 25 MG PO CAPS: 25.0000 mg | ORAL_CAPSULE | Freq: Once | ORAL | Status: DC

## 2014-11-17 MED ORDER — PROMETHAZINE HCL 25 MG/ML IJ SOLN: 12.5000 mg | Freq: Once | INTRAMUSCULAR | Status: DC

## 2014-11-17 MED ORDER — HYDRALAZINE HCL 20 MG/ML IJ SOLN
5.0000 mg | Freq: Once | INTRAMUSCULAR | Status: AC
  Administered 2014-11-17: 5 mg via INTRAVENOUS
  Filled 2014-11-17: qty 1

## 2014-11-17 NOTE — Progress Notes (Signed)
Subjective:  Pt is sitting up in bed. He says that his cough is improved and denies SOB or chest pain. His only complain is nausea and he says that he is spitting up but not vomiting. He reports he is making a normal amount of urine and it looks normal. His back pain is improved  Objective: Vital signs in last 24 hours: Filed Vitals:   11/17/14 0100 11/17/14 0226 11/17/14 0227 11/17/14 0500  BP: 192/108 227/115 196/104 202/98  Pulse:    91  Temp:    99.1 F (37.3 C)  TempSrc:    Oral  Resp:    17  Height:      Weight:      SpO2:    94%   Weight change:   Intake/Output Summary (Last 24 hours) at 11/17/14 1037 Last data filed at 11/17/14 0806  Gross per 24 hour  Intake    607 ml  Output   1425 ml  Net   -818 ml   Gen: A&O x 4, well developed, well nourished, sitting on side of bed with basin that has spit in it HEENT: Atraumatic, PERRL, EOMI, sclerae anicteric, moist mucous membranes Heart: RRR, normal S1 S2, no murmurs, rubs, or gallops Lungs: CTAB Abd: Soft, non-tender, non-distended, +BS, no hepatosplenomegaly Ext: trace-1+ pitting edema b/l with stockings on  Lab Results: Basic Metabolic Panel:  Recent Labs Lab 11/15/14 1254 11/16/14 0521 11/17/14 0733  NA  --  131* 136  K  --  3.8 4.1  CL  --  100 102  CO2  --  20 21  GLUCOSE  --  103* 161*  BUN  --  78* 70*  CREATININE  --  5.72* 4.72*  CALCIUM  --  7.0* 8.0*  MG 2.1  --   --   PHOS  --  4.9* 5.4*   Liver Function Tests:  Recent Labs Lab 11/16/14 0521 11/17/14 0733  ALBUMIN 2.4* 2.8*   CBC:  Recent Labs Lab 11/11/14 1835  11/14/14 0427 11/15/14 0339  WBC 5.4  < > 6.4 4.8  NEUTROABS 4.0  --   --   --   HGB 10.1*  < > 10.1* 8.8*  HCT 31.1*  < > 31.1* 27.0*  MCV 83.2  < > 84.3 85.4  PLT 313  < > 232 192  < > = values in this interval not displayed. CBG:  Recent Labs Lab 11/15/14 2242 11/16/14 0804 11/16/14 1333 11/16/14 1745 11/16/14 2203 11/17/14 0741  GLUCAP 138* 121* 100*  103* 95 150*   Urinalysis:  Recent Labs Lab 11/14/14 1814  COLORURINE YELLOW  LABSPEC 1.025  PHURINE 5.0  GLUCOSEU NEGATIVE  HGBUR NEGATIVE  BILIRUBINUR NEGATIVE  KETONESUR NEGATIVE  PROTEINUR >300*  UROBILINOGEN 0.2  NITRITE NEGATIVE  LEUKOCYTESUR NEGATIVE    Micro Results: Recent Results (from the past 240 hour(s))  Blood culture (routine x 2)     Status: None (Preliminary result)   Collection Time: 11/13/14  1:36 PM  Result Value Ref Range Status   Specimen Description BLOOD LEFT ARM  Final   Special Requests BOTTLES DRAWN AEROBIC AND ANAEROBIC 2CC  Final   Culture   Final    GRAM POSITIVE COCCI IN CLUSTERS        BLOOD CULTURE RECEIVED NO GROWTH TO DATE CULTURE WILL BE HELD FOR 5 DAYS BEFORE ISSUING A FINAL NEGATIVE REPORT Note: Gram Stain Report Called to,Read Back By and Verified With: DARLA THOMPSON [id number]@7 :55AM BJENN Performed at [facility]    Report Status PENDING  Incomplete  Blood culture (routine x 2)     Status: None (Preliminary result)   Collection Time: 11/13/14  1:40 PM  Result Value Ref Range Status   Specimen Description BLOOD RIGHT ARM  Final   Special Requests BOTTLES DRAWN AEROBIC AND ANAEROBIC 10ML  Final   Culture   Final           BLOOD CULTURE RECEIVED NO GROWTH TO DATE CULTURE WILL BE HELD FOR 5 DAYS BEFORE ISSUING A FINAL NEGATIVE REPORT Performed at [facility]    Report Status PENDING  Incomplete  Culture, blood (routine x 2)     Status: None (Preliminary result)   Collection Time: 11/15/14 10:30 AM  Result Value Ref Range Status   Specimen Description BLOOD RIGHT ARM  Final   Special Requests BOTTLES DRAWN AEROBIC ONLY 4CC  Final   Culture   Final           BLOOD CULTURE RECEIVED NO GROWTH TO DATE CULTURE WILL BE HELD FOR 5 DAYS BEFORE ISSUING A FINAL NEGATIVE REPORT Performed at [facility]    Report Status PENDING  Incomplete  Culture, blood (routine x 2)     Status: None (Preliminary result)    Collection Time: 11/15/14 10:45 AM  Result Value Ref Range Status   Specimen Description BLOOD RIGHT HAND  Final   Special Requests BOTTLES DRAWN AEROBIC ONLY 2CC  Final   Culture   Final           BLOOD CULTURE RECEIVED NO GROWTH TO DATE CULTURE WILL BE HELD FOR 5 DAYS BEFORE ISSUING A FINAL NEGATIVE REPORT Performed at [facility]    Report Status PENDING  Incomplete   Studies/Results: US Renal  11/15/2014   CLINICAL DATA:  Acute kidney injury, history of medical renal disease, initial encounter.  EXAM: RENAL/URINARY TRACT ULTRASOUND COMPLETE  COMPARISON:  04/11/2013.  FINDINGS: Right Kidney:  Length: 17.5 cm. Parenchymal echogenicity is diffusely increased. No hydronephrosis. No focal lesion.  Left Kidney:  Length: 16.0 cm. Parenchymal echogenicity is diffusely increased. 2 anechoic lesions with increased through transmission in the lower pole measure up to 3.3 x 2.5 x 3.4 cm. No hydronephrosis.  Bladder:  Ureteral jets are seen bilaterally. Bladder is somewhat under distended.  IMPRESSION: 1. Increased renal parenchymal echogenicity is in keeping with chronic medical renal disease. 2. Left renal cysts.   Electronically Signed   By: Lorin Picket M.D.   On: 11/15/2014 18:43   Medications: I have reviewed the patient's current medications. Scheduled Meds: . calcium-vitamin D  1 tablet Oral BID  . clopidogrel  75 mg Oral Daily  . digoxin  0.25 mg Oral Daily  . gabapentin  300 mg Oral q morning - 10a  . heparin  5,000 Units Subcutaneous 3 times per day  . hydrALAZINE  25 mg Oral 3 times per day  . insulin aspart  0-15 Units Subcutaneous TID WC  . insulin glargine  32 Units Subcutaneous QHS  . labetalol  300 mg Oral BID  . multivitamin with minerals  1 tablet Oral q morning - 10a  . pravastatin  40 mg Oral Daily   Continuous Infusions: . sodium chloride 50 mL/hr at 11/17/14 0515   PRN Meds:.acetaminophen, fluticasone, guaiFENesin-codeine, ondansetron **OR** ondansetron  (ZOFRAN) IV, sodium chloride Assessment/Plan: Principal Problem:   Acute worsening of stage 3 chronic kidney disease Active Problems:   DM (diabetes mellitus), type 2, uncontrolled, with renal  complications   Essential hypertension   Acute on Chronic Kidney Disease Stage III and Nephrotic Syndrome:  SCr appears to have reached peak and is resolving, baseline 2.0-2.2 bumped to 3.84-->5.38 >5.72>4.72 today.  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  Pt lows with Dr. Fran Lowes ([facility]), but has not been followed recently. Kidney biopsy on 05/30/2013 showed advanced diffuse and nodular diabetic nephropathy with vascular changes c/w long-standing difficult to control HTN.  UA not suggestive.  Renal US non-revealing. Urinating OK. UOP recorded as 275 last shift but wonder if this is accurate as bedside urinal has 475 cc. -continue IVF at 50 mL/h -appreciate renal recs -holding all nephrotoxic agents -holding BP meds-->had episode of hypotension   Nausea: Mr Mensing has some nausea without true emesis. He says that the zofran is not helping. This is likely due to his AKI. He denies any chest pain, SOB, abdominal pain and exam is generally benign. -cont zofran 4 mg q6hprn and add phenergan 25 mg q6hprn -low threshold for EKG and troponin -cont to trend creatinine  Hypertension:  Resolved.  History of "hypertensive emergency". His current regimen includes clonidine 0.3 mg twice a day, digoxin 0.25 mg daily, hydralazine 100 mg 3 times a day, hydrochlorothiazide 25 mg daily, labetalol 300 mg twice a day, and losartan 100 mg daily. He had drop in BP to systolic in the 123XX123.  When questioned about BP meds he states takes "some of them." His meds were held and BP back up to 220s/110s even after labetalol 300 mg bid restarted -cont labetalol 300 mg bid, digoxin 0.25 mg daily -resume hydralazine 50 mg tid (1/2 home dose) -holding all other  BP meds  -cont gentle IVF -check digoxin level  Hypocalcemia: Mg OK.  Ionized Ca low at 0.96. Calcium supplementation begun and Ca is improving as up to 8.0 from 7.0 -cont calcium-vitamin d 500-200 mg-u po bid  Hyperphosphatemia: Ph 5.4, up from 4.9 4/3 -cont to monitor and consider phosphate binder  DM2 with Renal Complications:  Most recent A1c 7.7%. - -SSI, lantus -Continue neurontin 300 mg every morning  Chronic Diastolic Heart Failure:  123XX123 LVEF 55-60% with moderate diastolic dysfunction, normal RV size and systolic function. -Continue home anti-hypertensives as above, plavix 75 mg BID and statin as below  History of CVA:  -Continue home plavix as above  Hyperlipidemia:  -Continue home pravastatin  DVT Ppx: [place] heparin  Diet: HH/CM  Dispo: Disposition is deferred at this time, awaiting improvement of current medical problems.  Anticipated discharge in approximately 1-2 day(s).  -PT to evaluate  The patient does have a current PCP Bertha Stakes, MD) and does need an [facility] hospital follow-up appointment after discharge.   LOS: 4 days   Kelby Aline, MD 11/17/2014, 10:37 AM
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
## 2014-11-17 NOTE — Progress Notes (Signed)
Patient seen and examined. Case d/w residents in detail. I agree with findings and plan as documented in Dr. Bebe Shaggy note.  Patient does complain of nausea and poor PO intake today. He denies CP/SOB/epigastric pain. This is all possibly related to his AKI on CKD. His creatinine is improving. Nephrology follow up appreciated. AKI likely secondary to contrast and NSAID use. Will monitor. C/w IVF @50cc /hr for now  BP is elevated to the 200s today. Labetolol and hydralazine were re initiated. Will monitor. Use clonidine prn for elevated BP. Hold ARB and HCTZ secondary to AKI.

## 2014-11-18 DIAGNOSIS — Z794 Long term (current) use of insulin: Secondary | ICD-10-CM

## 2014-11-18 DIAGNOSIS — I130 Hypertensive heart and chronic kidney disease with heart failure and stage 1 through stage 4 chronic kidney disease, or unspecified chronic kidney disease: Secondary | ICD-10-CM

## 2014-11-18 DIAGNOSIS — Z79899 Other long term (current) drug therapy: Secondary | ICD-10-CM

## 2014-11-18 DIAGNOSIS — E1121 Type 2 diabetes mellitus with diabetic nephropathy: Secondary | ICD-10-CM

## 2014-11-18 DIAGNOSIS — E1122 Type 2 diabetes mellitus with diabetic chronic kidney disease: Secondary | ICD-10-CM

## 2014-11-18 LAB — RENAL FUNCTION PANEL
Albumin: 2.7 g/dL — ABNORMAL LOW (ref 3.5–5.2)
Anion gap: 10 (ref 5–15)
BUN: 62 mg/dL — AB (ref 6–23)
CHLORIDE: 103 mmol/L (ref 96–112)
CO2: 25 mmol/L (ref 19–32)
Calcium: 8.4 mg/dL (ref 8.4–10.5)
Creatinine, Ser: 4.19 mg/dL — ABNORMAL HIGH (ref 0.50–1.35)
GFR calc Af Amer: 17 mL/min — ABNORMAL LOW (ref >90)
GFR calc non Af Amer: 14 mL/min — ABNORMAL LOW (ref >90)
GLUCOSE: 134 mg/dL — AB (ref 70–99)
Phosphorus: 4.5 mg/dL (ref 2.3–4.6)
Potassium: 3.9 mmol/L (ref 3.5–5.1)
Sodium: 138 mmol/L (ref 135–145)

## 2014-11-18 LAB — GLUCOSE, CAPILLARY
Glucose-Capillary: 123 mg/dL — ABNORMAL HIGH (ref 70–99)
Glucose-Capillary: 201 mg/dL — ABNORMAL HIGH (ref 70–99)
Glucose-Capillary: 117 mg/dL — ABNORMAL HIGH (ref 70–99)
Glucose-Capillary: 147 mg/dL — ABNORMAL HIGH (ref 70–99)

## 2014-11-18 LAB — CBC
HCT: 28.8 % — ABNORMAL LOW (ref 39.0–52.0)
Hemoglobin: 9.5 g/dL — ABNORMAL LOW (ref 13.0–17.0)
MCH: 27.4 pg (ref 26.0–34.0)
MCHC: 33 g/dL (ref 30.0–36.0)
MCV: 83 fL (ref 78.0–100.0)
PLATELETS: 219 10*3/uL (ref 150–400)
RBC: 3.47 MIL/uL — ABNORMAL LOW (ref 4.22–5.81)
RDW: 13.1 % (ref 11.5–15.5)
WBC: 4.6 10*3/uL (ref 4.0–10.5)

## 2014-11-18 LAB — DIGOXIN LEVEL: Digoxin Level: 0.4 ng/mL — ABNORMAL LOW (ref 0.8–2.0)

## 2014-11-18 MED ORDER — HYDRALAZINE HCL 50 MG PO TABS
100.0000 mg | ORAL_TABLET | Freq: Three times a day (TID) | ORAL | Status: DC
  Administered 2014-11-18 – 2014-11-19 (×3): 100 mg via ORAL
  Filled 2014-11-18 (×3): qty 4

## 2014-11-18 MED ORDER — CLONIDINE HCL 0.2 MG PO TABS
0.3000 mg | ORAL_TABLET | Freq: Two times a day (BID) | ORAL | Status: DC
  Administered 2014-11-18 – 2014-11-19 (×3): 0.3 mg via ORAL
  Filled 2014-11-18 (×6): qty 1

## 2014-11-18 MED ORDER — DIPHENHYDRAMINE HCL 25 MG PO CAPS: 25.0000 mg | ORAL_CAPSULE | Freq: Once | ORAL | Status: DC

## 2014-11-18 NOTE — Progress Notes (Signed)
Spoke with Resident for teaching service about hypertention unresolved after clonidine.  Per order will increase dose of appressoline and scheduling clonidine. Would like to continue IV fluid due to acute kidney injury

## 2014-11-18 NOTE — Progress Notes (Signed)
Subjective:  Pt is sitting up in bedside chair. He says that he feels better and denies any nausea, chest pain, SOB. Says he is still making good urine with urinal sitting at bedside. He says he wants to go home and we explained that it will not be safe until we have better control of his blood pressure and that his renal status limits our options of antihypertensives. He voiced understanding.    Objective: Vital signs in last 24 hours: Filed Vitals:   11/18/14 0607 11/18/14 0654 11/18/14 0900 11/18/14 1102  BP: 229/104 219/102 202/92   Pulse: 91  100 100  Temp: 98.9 F (37.2 C)  98.6 F (37 C)   TempSrc: Oral  Oral   Resp: 20  20   Height:      Weight:      SpO2: 97%  97%    Weight change:   Intake/Output Summary (Last 24 hours) at 11/18/14 1113 Last data filed at 11/18/14 1000  Gross per 24 hour  Intake   2052 ml  Output   1825 ml  Net    227 ml   Gen: A&O x 4, well developed, well nourished, sitting up in chair HEENT: Atraumatic, PERRL, EOMI, sclerae anicteric, moist mucous membranes Heart: RRR, normal S1 S2, no murmurs, rubs, or gallops Lungs: CTAB Abd: Soft, non-tender, non-distended, +BS, no hepatosplenomegaly Ext: trace-1+ pitting edema b/l with stockings on  Lab Results: Basic Metabolic Panel:  Recent Labs Lab 11/15/14 1254  11/17/14 0733 11/18/14 0603  NA  --   < > 136 138  K  --   < > 4.1 3.9  CL  --   < > 102 103  CO2  --   < > 21 25  GLUCOSE  --   < > 161* 134*  BUN  --   < > 70* 62*  CREATININE  --   < > 4.72* 4.19*  CALCIUM  --   < > 8.0* 8.4  MG 2.1  --   --   --   PHOS  --   < > 5.4* 4.5  < > = values in this interval not displayed. Liver Function Tests:  Recent Labs Lab 11/17/14 0733 11/18/14 0603  ALBUMIN 2.8* 2.7*   CBC:  Recent Labs Lab 11/11/14 1835  11/15/14 0339 11/18/14 0603  WBC 5.4  < > 4.8 4.6  NEUTROABS 4.0  --   --   --   HGB 10.1*  < > 8.8* 9.5*  HCT 31.1*  < > 27.0* 28.8*  MCV 83.2  < > 85.4 83.0  PLT 313  <  > 192 219  < > = values in this interval not displayed. CBG:  Recent Labs Lab 11/16/14 2203 11/17/14 0741 11/17/14 1254 11/17/14 1737 11/17/14 2139 11/18/14 0747  GLUCAP 95 150* 177* 140* 124* 123*   Urinalysis:  Recent Labs Lab 11/14/14 1814  COLORURINE YELLOW  LABSPEC 1.025  PHURINE 5.0  GLUCOSEU NEGATIVE  HGBUR NEGATIVE  BILIRUBINUR NEGATIVE  KETONESUR NEGATIVE  PROTEINUR >300*  UROBILINOGEN 0.2  NITRITE NEGATIVE  LEUKOCYTESUR NEGATIVE    Micro Results: Recent Results (from the past 240 hour(s))  Blood culture (routine x 2)     Status: None   Collection Time: 11/13/14  1:36 PM  Result Value Ref Range Status   Specimen Description BLOOD LEFT ARM  Final   Special Requests BOTTLES DRAWN AEROBIC AND ANAEROBIC 2CC  Final   Culture   Final    STAPHYLOCOCCUS  SPECIES (COAGULASE NEGATIVE) Note: THE SIGNIFICANCE OF ISOLATING THIS ORGANISM FROM A SINGLE SET OF BLOOD CULTURES WHEN MULTIPLE SETS ARE DRAWN IS UNCERTAIN. PLEASE NOTIFY THE MICROBIOLOGY DEPARTMENT WITHIN ONE WEEK IF SPECIATION AND SENSITIVITIES ARE REQUIRED. Note: Gram Stain Report Called to,Read Back By and Verified With: DARLA THOMPSON [id number]@7 :55AM BJENN Performed at [facility]    Report Status 11/17/2014 FINAL  Final  Blood culture (routine x 2)     Status: None (Preliminary result)   Collection Time: 11/13/14  1:40 PM  Result Value Ref Range Status   Specimen Description BLOOD RIGHT ARM  Final   Special Requests BOTTLES DRAWN AEROBIC AND ANAEROBIC 10ML  Final   Culture   Final           BLOOD CULTURE RECEIVED NO GROWTH TO DATE CULTURE WILL BE HELD FOR 5 DAYS BEFORE ISSUING A FINAL NEGATIVE REPORT Performed at [facility]    Report Status PENDING  Incomplete  Culture, blood (routine x 2)     Status: None (Preliminary result)   Collection Time: 11/15/14 10:30 AM  Result Value Ref Range Status   Specimen Description BLOOD RIGHT ARM  Final   Special Requests BOTTLES DRAWN AEROBIC ONLY  4CC  Final   Culture   Final           BLOOD CULTURE RECEIVED NO GROWTH TO DATE CULTURE WILL BE HELD FOR 5 DAYS BEFORE ISSUING A FINAL NEGATIVE REPORT Performed at [facility]    Report Status PENDING  Incomplete  Culture, blood (routine x 2)     Status: None (Preliminary result)   Collection Time: 11/15/14 10:45 AM  Result Value Ref Range Status   Specimen Description BLOOD RIGHT HAND  Final   Special Requests BOTTLES DRAWN AEROBIC ONLY 2CC  Final   Culture   Final           BLOOD CULTURE RECEIVED NO GROWTH TO DATE CULTURE WILL BE HELD FOR 5 DAYS BEFORE ISSUING A FINAL NEGATIVE REPORT Performed at [facility]    Report Status PENDING  Incomplete   Studies/Results: No results found. Medications: I have reviewed the patient's current medications. Scheduled Meds: . calcium-vitamin D  1 tablet Oral BID  . cloNIDine  0.3 mg Oral BID  . clopidogrel  75 mg Oral Daily  . digoxin  0.25 mg Oral Daily  . gabapentin  300 mg Oral q morning - 10a  . heparin  5,000 Units Subcutaneous 3 times per day  . hydrALAZINE  100 mg Oral 3 times per day  . insulin aspart  0-15 Units Subcutaneous TID WC  . insulin glargine  32 Units Subcutaneous QHS  . labetalol  300 mg Oral BID  . multivitamin with minerals  1 tablet Oral q morning - 10a  . pravastatin  40 mg Oral Daily   Continuous Infusions:   PRN Meds:.acetaminophen, fluticasone, guaiFENesin-codeine, ondansetron **OR** ondansetron (ZOFRAN) IV, promethazine, sodium chloride Assessment/Plan: Principal Problem:   Acute worsening of stage 3 chronic kidney disease Active Problems:   DM (diabetes mellitus), type 2, uncontrolled, with renal complications   Essential hypertension   Acute on Chronic Kidney Disease Stage III and Nephrotic Syndrome:  SCr appears to have reached peak and is resolving, baseline 2.0-2.2 bumped to 3.84-->5.38 >5.72>4.72>4.19 today.  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   Pt follows with Dr. Fran Lowes ([facility]), but has not been followed recently. Kidney  biopsy on 05/30/2013 showed advanced diffuse and nodular diabetic nephropathy with vascular changes c/w long-standing difficult to control HTN.  UA not suggestive.  Renal US non-revealing. Urinating OK. UOP recorded as -2.3L yesterday -appreciate renal recs -d/c IVF at 50 mL/h -holding all nephrotoxic agents -see HTN below  Hypertension: History of "hypertensive emergency". His current regimen includes clonidine 0.3 mg twice a day, digoxin 0.25 mg daily, hydralazine 100 mg 3 times a day, hydrochlorothiazide 25 mg daily, labetalol 300 mg twice a day, and losartan 100 mg daily. He had drop in BP to systolic in the [id number].  When questioned about BP meds he states takes "some of them," thus they were all held and we have been adding them back cautiously. This morning his BP was 202/92 while on hydralazine 75 mg tid, labetalol 300 mg bid, digoxin 0.25 mg daily, with clonidine 0.1 mg q4hprn.  -cont labetalol 300 mg bid, digoxin 0.25 mg daily -increase hydralazine to 100 mg tid (home dose) -resume clonidine 0.3 mg bid -holding HCTZ and ARB in setting of AKI  Nausea: Mr Stutzman had some nausea without true emesis. This has resolved and was likely due to his AKI. He denies any chest pain, SOB, abdominal pain and exam is generally benign. -cont zofran 4 mg q6hprn and add phenergan 25 mg q6hprn -low threshold for EKG and troponin -cont to trend creatinine  Hypocalcemia: Mg OK.  Ionized Ca low at 0.96. Calcium supplementation begun and Ca is improving as up to 8.4 from 7.0 -cont calcium-vitamin d 500-200 mg-u po bid  Hyperphosphatemia: Ph down to 4.5 this morning with resolution of AKI -cont to monitor and consider phosphate binder  DM2 with Renal Complications:  Most recent A1c 7.7%. - -SSI, lantus -Continue neurontin 300 mg every morning  Chronic Diastolic Heart Failure:  [id number]  LVEF 55-60% with moderate diastolic dysfunction, normal RV size and systolic function. -Continue home anti-hypertensives as above, plavix 75 mg BID and statin as below  History of CVA:  -Continue home plavix as above  Hyperlipidemia:  -Continue home pravastatin  DVT Ppx: [place] heparin  Diet: HH/CM  Dispo: Disposition is deferred at this time, awaiting improvement of current medical problems.  Anticipated discharge in approximately 1 day(s).  -PT to evaluate  The patient does have a current PCP Bertha Stakes, MD) and does need an [facility] hospital follow-up appointment after discharge.   LOS: 5 days   Kelby Aline, MD 11/18/2014, 11:13 AM

## 2014-11-18 NOTE — Care Management Note (Signed)
  Page 1 of 1   11/18/2014     7:47:35 AM CARE MANAGEMENT NOTE 11/18/2014  Patient:  Charles Hart, Charles Hart   Account Number:  [id number]  Date Initiated:  11/18/2014  Documentation initiated by:  Magdalen Spatz  Subjective/Objective Assessment:   HCAP     Action/Plan:   Anticipated DC Date:  11/20/2014   Anticipated DC Plan:  HOME/SELF CARE         Choice offered to / List presented to:             Status of service:  In process, will continue to follow Medicare Important Message given?  YES (If response is "NO", the following Medicare IM given date fields will be blank) Date Medicare IM given:  11/18/2014 Medicare IM given by:  Magdalen Spatz Date Additional Medicare IM given:   Additional Medicare IM given by:    Discharge Disposition:    Per UR Regulation:  Reviewed for med. necessity/level of care/duration of stay  If discussed at [place] of Stay Meetings, dates discussed:   11/18/2014    Comments:

## 2014-11-18 NOTE — Progress Notes (Signed)
Patient seen and examined. Case d/w residents in detail. I agree with findings and plan as documented in Dr. Bebe Shaggy note.  Patient feels well today with no nausea. Still with elevated BP with SBP in the 200s. We will increase hydralazine to 100 mg tid and start clonidine 0.3 mg bid (home dose). Would also consider adding amlodine. Given AKI on CKD would refrain from resuming ARB or thiazide.  AKI slowly improving. Nephro f/u appreciated. Likely ok for d/c tomorrow if Cr continues to improve and BP improves. D/C IVF. Check SPEP and HIV as part of CKD work up.
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
## 2014-11-19 LAB — PROTEIN ELECTROPHORESIS, SERUM
A/G RATIO SPE: 0.8 (ref 0.7–2.0)
ALBUMIN ELP: 2.3 g/dL — AB (ref 3.2–5.6)
ALPHA-1-GLOBULIN: 0.3 g/dL (ref 0.1–0.4)
ALPHA-2-GLOBULIN: 0.9 g/dL (ref 0.4–1.2)
Beta Globulin: 1 g/dL (ref 0.6–1.3)
Gamma Globulin: 0.8 g/dL (ref 0.5–1.6)
Globulin, Total: 3 g/dL (ref 2.0–4.5)
TOTAL PROTEIN ELP: 5.3 g/dL — AB (ref 6.0–8.5)

## 2014-11-19 LAB — BASIC METABOLIC PANEL
Anion gap: 10 (ref 5–15)
BUN: 52 mg/dL — AB (ref 6–23)
CHLORIDE: 104 mmol/L (ref 96–112)
CO2: 24 mmol/L (ref 19–32)
Calcium: 8.4 mg/dL (ref 8.4–10.5)
Creatinine, Ser: 3.72 mg/dL — ABNORMAL HIGH (ref 0.50–1.35)
GFR calc Af Amer: 19 mL/min — ABNORMAL LOW (ref >90)
GFR calc non Af Amer: 17 mL/min — ABNORMAL LOW (ref >90)
GLUCOSE: 98 mg/dL (ref 70–99)
POTASSIUM: 3.8 mmol/L (ref 3.5–5.1)
Sodium: 138 mmol/L (ref 135–145)

## 2014-11-19 LAB — GLUCOSE, CAPILLARY
Glucose-Capillary: 124 mg/dL — ABNORMAL HIGH (ref 70–99)
Glucose-Capillary: 155 mg/dL — ABNORMAL HIGH (ref 70–99)

## 2014-11-19 LAB — HIV ANTIBODY (ROUTINE TESTING W REFLEX): HIV SCREEN 4TH GENERATION: NONREACTIVE

## 2014-11-19 LAB — CULTURE, BLOOD (ROUTINE X 2): Culture: NO GROWTH

## 2014-11-19 LAB — PROTEIN / CREATININE RATIO, URINE
CREATININE, URINE: 72.52 mg/dL
Protein Creatinine Ratio: 3.85 — ABNORMAL HIGH (ref 0.00–0.15)
TOTAL PROTEIN, URINE: 279 mg/dL

## 2014-11-19 MED ORDER — CALCIUM CARBONATE-VITAMIN D 500-200 MG-UNIT PO TABS: 1.0000 | ORAL_TABLET | Freq: Two times a day (BID) | ORAL | Status: DC

## 2014-11-19 NOTE — Progress Notes (Signed)
Verbally understands DC instructions, wife at bedside to take pt home.
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
## 2014-11-19 NOTE — Progress Notes (Signed)
Subjective:  Pt is sitting up on side of bed. He says that he has no complaints and continues to have no  nausea, chest pain, SOB, troubles with urination. He wants to go home.     Objective: Vital signs in last 24 hours: Filed Vitals:   11/18/14 2307 11/19/14 0520 11/19/14 0750 11/19/14 0950  BP: 186/84 143/71 172/84 168/82  Pulse:  68 75 77  Temp:  97.9 F (36.6 C) 97.8 F (36.6 C)   TempSrc:  Oral Oral   Resp:  18 18   Height:      Weight:      SpO2:  100% 100%    Weight change:   Intake/Output Summary (Last 24 hours) at 11/19/14 1006 Last data filed at 11/19/14 0750  Gross per 24 hour  Intake    563 ml  Output   1350 ml  Net   -787 ml   Gen: A&O x 4, well developed, well nourished, sitting up in chair HEENT: Atraumatic, PERRL, EOMI, sclerae anicteric, moist mucous membranes Heart: RRR, normal S1 S2, no murmurs, rubs, or gallops Lungs: CTAB Abd: Soft, non-tender, non-distended, +BS, no hepatosplenomegaly Ext: trace-1+ pitting edema b/l with stockings on  Lab Results: Basic Metabolic Panel:  Recent Labs Lab 11/15/14 1254  11/17/14 0733 11/18/14 0603 11/19/14 0351  NA  --   < > 136 138 138  K  --   < > 4.1 3.9 3.8  CL  --   < > 102 103 104  CO2  --   < > 21 25 24   GLUCOSE  --   < > 161* 134* 98  BUN  --   < > 70* 62* 52*  CREATININE  --   < > 4.72* 4.19* 3.72*  CALCIUM  --   < > 8.0* 8.4 8.4  MG 2.1  --   --   --   --   PHOS  --   < > 5.4* 4.5  --   < > = values in this interval not displayed. Liver Function Tests:  Recent Labs Lab 11/17/14 0733 11/18/14 0603  ALBUMIN 2.8* 2.7*   CBC:  Recent Labs Lab 11/15/14 0339 11/18/14 0603  WBC 4.8 4.6  HGB 8.8* 9.5*  HCT 27.0* 28.8*  MCV 85.4 83.0  PLT 192 219   CBG:  Recent Labs Lab 11/17/14 2139 11/18/14 0747 11/18/14 1235 11/18/14 1642 11/18/14 2158 11/19/14 0717  GLUCAP 124* 123* 201* 117* 147* 124*   Urinalysis:  Recent Labs Lab 11/14/14 1814  COLORURINE YELLOW  LABSPEC  1.025  PHURINE 5.0  GLUCOSEU NEGATIVE  HGBUR NEGATIVE  BILIRUBINUR NEGATIVE  KETONESUR NEGATIVE  PROTEINUR >300*  UROBILINOGEN 0.2  NITRITE NEGATIVE  LEUKOCYTESUR NEGATIVE    Protein creatinine ratio 3.85 HIV negative  Micro Results: Recent Results (from the past 240 hour(s))  Blood culture (routine x 2)     Status: None   Collection Time: 11/13/14  1:36 PM  Result Value Ref Range Status   Specimen Description BLOOD LEFT ARM  Final   Special Requests BOTTLES DRAWN AEROBIC AND ANAEROBIC 2CC  Final   Culture   Final    STAPHYLOCOCCUS SPECIES (COAGULASE NEGATIVE) Note: THE SIGNIFICANCE OF ISOLATING THIS ORGANISM FROM A SINGLE SET OF BLOOD CULTURES WHEN MULTIPLE SETS ARE DRAWN IS UNCERTAIN. PLEASE NOTIFY THE MICROBIOLOGY DEPARTMENT WITHIN ONE WEEK IF SPECIATION AND SENSITIVITIES ARE REQUIRED. Note: Gram Stain Report Called to,Read Back By and Verified With: DARLA THOMPSON [id number]@7 :55AM BJENN Performed at [facility]    Report Status 11/17/2014 FINAL  Final  Blood culture (routine x 2)     Status: None   Collection Time: 11/13/14  1:40 PM  Result Value Ref Range Status   Specimen Description BLOOD RIGHT ARM  Final   Special Requests BOTTLES DRAWN AEROBIC AND ANAEROBIC 10ML  Final   Culture   Final    NO GROWTH 5 DAYS Performed at [facility]    Report Status 11/19/2014 FINAL  Final  Culture, blood (routine x 2)     Status: None (Preliminary result)   Collection Time: 11/15/14 10:30 AM  Result Value Ref Range Status   Specimen Description BLOOD RIGHT ARM  Final   Special Requests BOTTLES DRAWN AEROBIC ONLY 4CC  Final   Culture   Final           BLOOD CULTURE RECEIVED NO GROWTH TO DATE CULTURE WILL BE HELD FOR 5 DAYS BEFORE ISSUING A FINAL NEGATIVE REPORT Performed at [facility]    Report Status PENDING  Incomplete  Culture, blood (routine x 2)     Status: None (Preliminary result)   Collection Time: 11/15/14 10:45 AM  Result Value Ref Range  Status   Specimen Description BLOOD RIGHT HAND  Final   Special Requests BOTTLES DRAWN AEROBIC ONLY 2CC  Final   Culture   Final           BLOOD CULTURE RECEIVED NO GROWTH TO DATE CULTURE WILL BE HELD FOR 5 DAYS BEFORE ISSUING A FINAL NEGATIVE REPORT Performed at [facility]    Report Status PENDING  Incomplete   Studies/Results: No results found. Medications: I have reviewed the patient's current medications. Scheduled Meds: . calcium-vitamin D  1 tablet Oral BID  . cloNIDine  0.3 mg Oral BID  . clopidogrel  75 mg Oral Daily  . digoxin  0.25 mg Oral Daily  . diphenhydrAMINE  25 mg Oral Once  . gabapentin  300 mg Oral q morning - 10a  . heparin  5,000 Units Subcutaneous 3 times per day  . hydrALAZINE  100 mg Oral 3 times per day  . insulin aspart  0-15 Units Subcutaneous TID WC  . insulin glargine  32 Units Subcutaneous QHS  . labetalol  300 mg Oral BID  . multivitamin with minerals  1 tablet Oral q morning - 10a  . pravastatin  40 mg Oral Daily   Continuous Infusions:   PRN Meds:.acetaminophen, fluticasone, guaiFENesin-codeine, ondansetron **OR** ondansetron (ZOFRAN) IV, promethazine, sodium chloride Assessment/Plan: Principal Problem:   Acute worsening of stage 3 chronic kidney disease Active Problems:   DM (diabetes mellitus), type 2, uncontrolled, with renal complications   Essential hypertension   Acute on Chronic Kidney Disease Stage III and Nephrotic Syndrome:  SCr appears to have reached peak and is resolving, baseline 2.0-2.2 bumped to 3.84-->5.38 >5.72>4.72>4.19>3.72 today.  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  Pt follows with Dr. Fran Lowes ([facility]), but has not been followed recently. Kidney biopsy on 05/30/2013 showed advanced diffuse and nodular diabetic nephropathy with vascular changes c/w long-standing difficult to control HTN.  UA not suggestive.  Renal US non-revealing. Urinating  OK. UOP recorded as -1.3L yesterday -appreciate renal recs -holding all nephrotoxic agents -see HTN below -f/u SPEP  Hypertension: History of "hypertensive emergency". His current home regimen includes clonidine 0.3 mg twice a day, digoxin 0.25 mg daily, hydralazine 100 mg 3 times  a day, hydrochlorothiazide 25 mg daily, labetalol 300 mg twice a day, and losartan 100 mg daily. He had drop in BP to systolic in the [id number].  When questioned about BP meds he states takes "some of them," thus they were all held and we have been adding them back cautiously. This morning his BP was 143/71 and 172/84 while on hydralazine 100 mg tid, labetalol 300 mg bid, digoxin 0.25 mg daily, clonidine 0.3 mg bid..  -cont labetalol 300 mg bid, digoxin 0.25 mg daily,  hydralazine to 100 mg tid, clonidine 0.3 mg bid -holding HCTZ and ARB in setting of AKI  Nausea: Mr Nie had some nausea without true emesis. This has resolved and was likely due to his AKI. He denies any chest pain, SOB, abdominal pain and exam is generally benign. -cont zofran 4 mg q6hprn and add phenergan 25 mg q6hprn -low threshold for EKG and troponin -repeat creatinine at outpatient follow-up  Hypocalcemia: Mg OK.  Ionized Ca low at 0.96. Calcium supplementation begun and Ca is improving as up to 8.4 from 7.0 -cont calcium-vitamin d 500-200 mg-u po bid  Hyperphosphatemia: Ph down to 4.5 11/18/14 with resolution of AKI -cont to monitor and consider phosphate binder  DM2 with Renal Complications:  Most recent A1c 7.7%. - -SSI, lantus -Continue neurontin 300 mg every morning  Chronic Diastolic Heart Failure:  [id number] LVEF 55-60% with moderate diastolic dysfunction, normal RV size and systolic function. -Continue home anti-hypertensives as above, plavix 75 mg BID and statin as below  History of CVA:  -Continue home plavix as above  Hyperlipidemia:  -Continue home pravastatin  DVT Ppx: [place] heparin  Diet: HH/CM  Dispo: Disposition is  deferred at this time, awaiting improvement of current medical problems.  Anticipated discharge in approximately 0 day(s).  -PT to evaluate  The patient does have a current PCP Bertha Stakes, MD) and does need an [facility] hospital follow-up appointment after discharge.   LOS: 6 days   Kelby Aline, MD 11/19/2014, 10:06 AM

## 2014-11-19 NOTE — Progress Notes (Signed)
Patient seen and examined. Case d/w residents in detail. I agree with findings and plan as documented in Dr. Bebe Shaggy note.   Patient much improved with no new complaints. He is tolerating PO. His creatinine continues to improve and his BP is better controlled today but still moderately elevated. He is stable for d/c home today withoutpatient f/u with nephrology and [facility]
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
## 2014-11-19 NOTE — Discharge Instructions (Signed)
It was a pleasure to care for you at [facility]. We have stopped your losartan and HCTZ blood pressure medicines. Your out-patient doctor will tell you whether to restart these when you see them soon. Do not take any NSAID anti-inflammatory such as ibuprofen. Please return to the Emergency Department or seek medical attention if you have any new or worsening chest pain, trouble breathing, or other worrisome medical condition.   Lottie Mussel, MD

## 2014-11-20 LAB — PARATHYROID HORMONE, INTACT (NO CA): PTH: 77 pg/mL — ABNORMAL HIGH (ref 15–65)

## 2014-11-21 LAB — CULTURE, BLOOD (ROUTINE X 2)
CULTURE: NO GROWTH
Culture: NO GROWTH

## 2014-11-25 ENCOUNTER — Other Ambulatory Visit: Payer: Self-pay | Admitting: Internal Medicine

## 2014-11-26 ENCOUNTER — Encounter: Payer: Self-pay | Admitting: Internal Medicine

## 2014-11-26 ENCOUNTER — Ambulatory Visit (INDEPENDENT_AMBULATORY_CARE_PROVIDER_SITE_OTHER): Payer: PPO | Admitting: Internal Medicine

## 2014-11-26 VITALS — BP 209/110 | HR 96 | Temp 98.2°F | Ht 72.0 in | Wt 195.6 lb

## 2014-11-26 DIAGNOSIS — E1122 Type 2 diabetes mellitus with diabetic chronic kidney disease: Secondary | ICD-10-CM

## 2014-11-26 DIAGNOSIS — E1129 Type 2 diabetes mellitus with other diabetic kidney complication: Secondary | ICD-10-CM

## 2014-11-26 DIAGNOSIS — N183 Chronic kidney disease, stage 3 unspecified: Secondary | ICD-10-CM

## 2014-11-26 DIAGNOSIS — E1165 Type 2 diabetes mellitus with hyperglycemia: Secondary | ICD-10-CM | POA: Diagnosis not present

## 2014-11-26 DIAGNOSIS — Z794 Long term (current) use of insulin: Secondary | ICD-10-CM

## 2014-11-26 DIAGNOSIS — E785 Hyperlipidemia, unspecified: Secondary | ICD-10-CM

## 2014-11-26 DIAGNOSIS — Z8673 Personal history of transient ischemic attack (TIA), and cerebral infarction without residual deficits: Secondary | ICD-10-CM

## 2014-11-26 DIAGNOSIS — I129 Hypertensive chronic kidney disease with stage 1 through stage 4 chronic kidney disease, or unspecified chronic kidney disease: Secondary | ICD-10-CM

## 2014-11-26 DIAGNOSIS — IMO0002 Reserved for concepts with insufficient information to code with codable children: Secondary | ICD-10-CM

## 2014-11-26 DIAGNOSIS — I10 Essential (primary) hypertension: Secondary | ICD-10-CM

## 2014-11-26 LAB — BASIC METABOLIC PANEL WITH GFR
BUN: 38 mg/dL — AB (ref 6–23)
CHLORIDE: 100 meq/L (ref 96–112)
CO2: 24 mEq/L (ref 19–32)
Calcium: 8.3 mg/dL — ABNORMAL LOW (ref 8.4–10.5)
Creat: 3.06 mg/dL — ABNORMAL HIGH (ref 0.50–1.35)
GFR, EST AFRICAN AMERICAN: 25 mL/min — AB
GFR, EST NON AFRICAN AMERICAN: 22 mL/min — AB
Glucose, Bld: 229 mg/dL — ABNORMAL HIGH (ref 70–99)
POTASSIUM: 4 meq/L (ref 3.5–5.3)
SODIUM: 136 meq/L (ref 135–145)

## 2014-11-26 LAB — GLUCOSE, CAPILLARY: GLUCOSE-CAPILLARY: 242 mg/dL — AB (ref 70–99)

## 2014-11-26 MED ORDER — GABAPENTIN 300 MG PO CAPS: 300.0000 mg | ORAL_CAPSULE | Freq: Every morning | ORAL | Status: DC

## 2014-11-26 MED ORDER — CLOPIDOGREL BISULFATE 75 MG PO TABS: 75.0000 mg | ORAL_TABLET | Freq: Every day | ORAL | Status: DC

## 2014-11-26 MED ORDER — PRAVASTATIN SODIUM 40 MG PO TABS: 40.0000 mg | ORAL_TABLET | Freq: Every day | ORAL | Status: DC

## 2014-11-26 NOTE — Assessment & Plan Note (Addendum)
BP Readings from Last 3 Encounters:  11/26/14 209/110  11/19/14 168/82  11/11/14 174/91    Lab Results  Component Value Date   NA 138 11/19/2014   K 3.8 11/19/2014   CREATININE 3.72* 11/19/2014    Assessment: Blood pressure control: severely elevated Progress toward BP goal:  deteriorated Comments: has not taken bp meds  Plan: Medications:  Labetalol 300mg  BID, Clonidine 0.3mg  BID, Hydralazine 50mg  TID Educational resources provided:   Self management tools provided:   Other plans: I do not see a good indication for digoxin and he is out of this medication, i have removed it from his medications.  We will repeat a BMP today, we will have him back in 1 week and also have him see our pharmacist for additional education.

## 2014-11-26 NOTE — Progress Notes (Signed)
Medicine attending: Medical history, presenting problems, physical findings, and medications, reviewed with Dr Joni Reining and I concur with his evaluation and management plan. We will tr to simplify this man's antihypertensive regimen to improve compliance. We can not discern an indication for digoxin with normal LVEF, grade 2 diastolic dysfunction so we will discontinue it.

## 2014-11-26 NOTE — Assessment & Plan Note (Signed)
-  Patient has insulin with him, I have instructed him to resume insulin and to check CBGs 3 times a day. - He will bring meter to next visit in 1 week for review.

## 2014-11-26 NOTE — Assessment & Plan Note (Signed)
-  Out of palvix, will refill.

## 2014-11-26 NOTE — Assessment & Plan Note (Signed)
-   Out of pravastatin, will refill.

## 2014-11-26 NOTE — Patient Instructions (Signed)
General Instructions: Please take your medications as we have talked about, I want you to check your sugars and bring your meter and medicine next week for an appointmetn  Please try to bring all your medicines next time. This will help [name] keep you safe from mistakes.   Progress Toward Treatment Goals:  Treatment Goal 11/04/2014  Hemoglobin A1C deteriorated  Blood pressure deteriorated    Self Care Goals & Plans:  Self Care Goal 11/04/2014  Manage my medications take my medicines as prescribed; bring my medications to every visit  Monitor my health keep track of my blood glucose; bring my glucose meter and log to each visit; keep track of my blood pressure; check my feet daily  Eat healthy foods eat foods that are low in salt; eat baked foods instead of fried foods  Be physically active -  Meeting treatment goals -    Home Blood Glucose Monitoring 11/04/2014  Check my blood sugar 3 times a day  When to check my blood sugar before meals     Care Management & Community Referrals:  Referral 11/04/2014  Referrals made for care management support -  Referrals made to community resources none
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
## 2014-11-26 NOTE — Assessment & Plan Note (Signed)
-  Patient has been non compliant with medications. - We will repeat a BMP today and have him follow up weekly until improvement in BP control. - has not heard from [place] yet for an appointment.

## 2014-11-27 ENCOUNTER — Telehealth: Payer: Self-pay | Admitting: *Deleted

## 2014-11-27 ENCOUNTER — Telehealth: Payer: Self-pay | Admitting: Internal Medicine

## 2014-11-27 NOTE — Telephone Encounter (Signed)
Pharm calls and states pt's insurance will not pay for current cbg supplies, they want him changed to one touch ultra, i approved this verbally, are you ok with this verbal approval?

## 2014-11-27 NOTE — Telephone Encounter (Signed)
Yes I agree with one touch ultra.

## 2014-12-03 ENCOUNTER — Encounter: Payer: Medicare Other | Admitting: Pharmacist

## 2014-12-03 ENCOUNTER — Encounter: Payer: Self-pay | Admitting: Internal Medicine

## 2014-12-03 ENCOUNTER — Encounter: Payer: Self-pay | Admitting: Pharmacist

## 2014-12-03 ENCOUNTER — Ambulatory Visit (INDEPENDENT_AMBULATORY_CARE_PROVIDER_SITE_OTHER): Payer: PPO | Admitting: Internal Medicine

## 2014-12-03 ENCOUNTER — Ambulatory Visit (INDEPENDENT_AMBULATORY_CARE_PROVIDER_SITE_OTHER): Payer: PPO | Admitting: Pharmacist

## 2014-12-03 VITALS — BP 155/76 | HR 85 | Temp 97.5°F | Wt 223.1 lb

## 2014-12-03 DIAGNOSIS — E1165 Type 2 diabetes mellitus with hyperglycemia: Secondary | ICD-10-CM

## 2014-12-03 DIAGNOSIS — M545 Low back pain, unspecified: Secondary | ICD-10-CM

## 2014-12-03 DIAGNOSIS — I130 Hypertensive heart and chronic kidney disease with heart failure and stage 1 through stage 4 chronic kidney disease, or unspecified chronic kidney disease: Secondary | ICD-10-CM

## 2014-12-03 DIAGNOSIS — E1129 Type 2 diabetes mellitus with other diabetic kidney complication: Secondary | ICD-10-CM

## 2014-12-03 DIAGNOSIS — G8929 Other chronic pain: Secondary | ICD-10-CM | POA: Diagnosis not present

## 2014-12-03 DIAGNOSIS — E1122 Type 2 diabetes mellitus with diabetic chronic kidney disease: Secondary | ICD-10-CM | POA: Diagnosis not present

## 2014-12-03 DIAGNOSIS — N183 Chronic kidney disease, stage 3 unspecified: Secondary | ICD-10-CM

## 2014-12-03 DIAGNOSIS — I5032 Chronic diastolic (congestive) heart failure: Secondary | ICD-10-CM

## 2014-12-03 DIAGNOSIS — I10 Essential (primary) hypertension: Secondary | ICD-10-CM

## 2014-12-03 DIAGNOSIS — IMO0002 Reserved for concepts with insufficient information to code with codable children: Secondary | ICD-10-CM

## 2014-12-03 DIAGNOSIS — Z794 Long term (current) use of insulin: Secondary | ICD-10-CM

## 2014-12-03 LAB — GLUCOSE, CAPILLARY: GLUCOSE-CAPILLARY: 158 mg/dL — AB (ref 70–99)

## 2014-12-03 MED ORDER — DILTIAZEM HCL ER COATED BEADS 180 MG PO CP24: 180.0000 mg | ORAL_CAPSULE | Freq: Every day | ORAL | Status: DC

## 2014-12-03 MED ORDER — INSULIN PEN NEEDLE 31G X 5 MM MISC: 1.0000 [IU] | Freq: Four times a day (QID) | Status: DC | PRN

## 2014-12-03 NOTE — Progress Notes (Signed)
Internal Medicine Clinic Attending  Case discussed with Dr. Rivet at the time of the visit.  We reviewed the resident's history and exam and pertinent patient test results.  I agree with the assessment, diagnosis, and plan of care documented in the resident's note.  

## 2014-12-03 NOTE — Assessment & Plan Note (Signed)
Cr trending down since discharge from hospital: 3.72> 3.06 at follow up visit 1 week ago. Patient refusing repeat bmet today. He has follow up with [facility] in 2 weeks. He will need a repeat bmet at that appointment and he is agreeable to having labs done then. He has not been taking Losartan and HCTZ as instructed.  - bmet in 2 weeks at [facility] appointment  - f/u [facility] recommendations

## 2014-12-03 NOTE — Assessment & Plan Note (Addendum)
Patient with 28 lb weight gain since appointment 1 week ago. Likely a combination of not being on his diuretic (HCTZ) due to worsening renal failure, diastolic heart failure, and poor diet. Unable to start a diuretic due to patient refusing repeat bmet today and Cr still elevated per labs last week. He is asymptomatic as he denies any dyspnea and orthopnea. He did have trace-1+ pitting edema in his lower extremities one exam, but no JVD or crackles in lungs appreciated. Patient has follow up with Dr. [name] on 4/25 and [facility] in 2 weeks.  - Continue to monitor. Patient has f/u appointment in 2 weeks and will reassess then.  - f/u recommendations from Dr. [name] and Vianne Bulls

## 2014-12-03 NOTE — Progress Notes (Signed)
Reviewed patient's medication regimen with PCP including allergies, interactions, disease-state management, dosing and immunization history. Medications were discussed with the patient, including name, instructions, indication, goals of therapy, potential side effects, importance of adherence, and safe use.  Challenging patient case due to renal impairment and history of reactions to amlodipine and lisinopril, as well as patient refusal of lab monitoring at this time. Patient also referred to cardiology and nephrology.  Recommend considering increasing pravastatin dose in the future or switching to high-intensity statin therapy.  Patient reports concerns with medication cost. Medications include generic agents prescribed for 90 days supply where possible. Patient is able to obtain medications at this time and he is undergoing regimen changes. Therefore, advised patient to contact me in the future if further help is needed.

## 2014-12-03 NOTE — Assessment & Plan Note (Signed)
Lab Results  Component Value Date   HGBA1C 7.7 11/04/2014   HGBA1C 6.4 09/02/2014   HGBA1C 11.0 04/25/2014     Assessment: Diabetes control: fair control Progress toward A1C goal:  deteriorated Comments: HbA1c has increased since January this year. Patient wishes to try Novolog sliding scale to control his blood sugars and use Novolog 7 units when he is not at home.   Plan: Medications:  continue current medications Home glucose monitoring: Frequency: 2 times a day Timing: before breakfast, before dinner Instruction/counseling given: reminded to bring blood glucose meter & log to each visit and reminded to bring medications to each visit Self management tools provided: copy of home glucose meter download Other plans:  - Continue Lantus 30 units QHS - Continue Novolog sliding scale TID with meals as outlined in discharge summary on 11/19/14 - Continue Novolog 7 units TID with meals when not able to use sliding scale - Will try sliding scale for 2 weeks and see how well sugars are controlled. I would prefer for him to be on a set dose for each meal but patient wishes to try sliding scale method. If blood sugars not controlled with sliding scale will then switch to a set dose for each meal - Continue Gabapentin 300 mg QHS for peripheral neuropathy - f/u in 2 weeks

## 2014-12-03 NOTE — Assessment & Plan Note (Signed)
BP Readings from Last 3 Encounters:  12/03/14 155/76  11/26/14 209/110  11/19/14 168/82    Lab Results  Component Value Date   NA 136 11/26/2014   K 4.0 11/26/2014   CREATININE 3.06* 11/26/2014    Assessment: Blood pressure control: mildly elevated Progress toward BP goal:  improved Comments: Improved compared to last visit, but still elevated. In tough spot since patient is refusing to have blood drawn today. Would prefer a diuretic given his weight gain but do not want to start one without knowing Cr level. An ARB is also preferred given his diabetes and associated kidney disease, but again will not have Cr level.   Plan: Medications:  continue current medications Other plans:  - Start Cardizem 180 mg daily  - Continue Hydralazine 100 mg TID - Continue Labetalol 300 mg BID - Continue Clonidine 0.3 mg BID - Patient has follow up with Dr. [name] on 4/25 and [facility] in 2 weeks, will follow up their recommendations

## 2014-12-03 NOTE — Patient Instructions (Signed)
It was a pleasure seeing you today, Charles Hart.  - Start taking Cardizem 180 mg daily - Do not take Losartan and HCTZ - Please check your blood sugars 3 times daily (breakfast, lunch, and dinner) - Continue Lantus 30 units at bedtime - Continue Novolog 7 units three times a day with meals when eating out - Use Novolog sliding scale when at home - Make sure to make your appointment with [facility] so that your creatinine can be checked  General Instructions:   Thank you for bringing your medicines today. This helps [name] keep you safe from mistakes.   Progress Toward Treatment Goals:  Treatment Goal 11/26/2014  Hemoglobin A1C unable to assess  Blood pressure deteriorated    Self Care Goals & Plans:  Self Care Goal 12/03/2014  Manage my medications take my medicines as prescribed; refill my medications on time; bring my medications to every visit  Monitor my health keep track of my blood glucose; bring my glucose meter and log to each visit; keep track of my blood pressure  Eat healthy foods eat more vegetables; eat foods that are low in salt; eat baked foods instead of fried foods  Be physically active find an activity I enjoy; take a walk every day  Meeting treatment goals -    Home Blood Glucose Monitoring 11/26/2014  Check my blood sugar 3 times a day  When to check my blood sugar before meals     Care Management & Community Referrals:  Referral 11/26/2014  Referrals made for care management support pharmacy clinic  Referrals made to community resources -

## 2014-12-03 NOTE — Progress Notes (Signed)
   Subjective:    Patient ID: Charles Hart, male    DOB: Jan 10, 1956, 59 y.o.   MRN: [id number]  HPI Charles Hart is a 58yo man with PMHx of HTN, chronic diastolic HF, Type 2 DM, and CKD Stage III who presents today for the following:  Acute Worsening of CKD Stage III: Cr on discharge (4/6) was 3.72. At his hospital follow up visit his Cr continues to trend down to 3.06. Patient has an appointment with [facility] in 2 weeks. Patient is adamant that he does not want his blood drawn today. He reports he is urinating well with no issues.   HTN: BP today 155/76. Patient takes Clonidine 0.3 mg BID, Hydralazine 100 mg TID, and Labetalol 300 mg BID. His HCTZ and Losartan were discontinued during his recent hospitalization due to worsening of his kidney disease.   Type 2 DM: Last HbA1c 7.7 on 11/04/14 was 7.7. Patient takes Lantus 30 units QHS and either does a Novolog sliding scale that was provided to him upon discharge or Novolog 7 units TID with meals if he is not at home. He is also on Gabapentin 300 mg every morning for his peripheral neuropathy. Patient reports he checks his blood sugars twice daily as he usually only eats breakfast and dinner, rarely lunch. He is aware when his blood sugar is low as he gets diaphoretic and "unable to think clearly." He denies any recent lows in the past month. His BG meter shows only a few readings with blood sugars ranging from 136-156. However, there are not enough blood sugar checks to adequately represent his overall blood sugar control. He denies polyuria, polydipsia, blurry vision, abdominal pain, nausea, and vomiting.   Recent Weight Gain: Patient's weight has increased from 195 at last week's visit to 223 lbs today. Patient has a history of diastolic HF (grade 2) per echo in Aug 2014. He has been off of his diuretic HCTZ since hospitalization due to acute worsening of his kidney disease. Patient notes some leg swelling. He denies eating salty foods and  adding salt to his foods. He denies dyspnea, cough, chest pain.    Review of Systems General: Denies fever, chills, night sweats, changes in appetite HEENT: Denies headaches, ear pain, changes in vision, rhinorrhea, sore throat CV: Denies CP, palpitations, SOB, orthopnea Pulm: Denies SOB, cough, wheezing GI: Denies abdominal pain, nausea, vomiting, diarrhea, constipation, melena, hematochezia GU: Denies dysuria, hematuria, frequency Msk: Denies muscle cramps, joint pains Neuro: Denies weakness, numbness, tingling Skin: Denies rashes, bruising    Objective:   Physical Exam General: sitting up in chair, NAD HEENT: [place]/AT, EOMI, mucus membranes moist Neck: supple, no JVD appreciated  CV: RRR, no m/g/r Pulm: CTA bilaterally, no crackles noted, breaths non-labored Abd: BS+. Soft, obese, non-tender Ext: warm, trace-1+ pitting edema bilaterally Neuro: alert and oriented x 3, no focal deficits     Assessment & Plan:  Please refer to A&P documentation.

## 2014-12-03 NOTE — Progress Notes (Signed)
Team care, note documented under PCP encounter.

## 2014-12-05 ENCOUNTER — Emergency Department (HOSPITAL_COMMUNITY): Payer: PPO

## 2014-12-05 ENCOUNTER — Ambulatory Visit (INDEPENDENT_AMBULATORY_CARE_PROVIDER_SITE_OTHER): Payer: PPO | Admitting: Internal Medicine

## 2014-12-05 ENCOUNTER — Other Ambulatory Visit: Payer: Self-pay

## 2014-12-05 ENCOUNTER — Inpatient Hospital Stay (HOSPITAL_COMMUNITY)
Admission: EM | Admit: 2014-12-05 | Discharge: 2014-12-08 | DRG: 291 | Disposition: A | Discharge status: Home / Self Care | Payer: PPO | Attending: Internal Medicine | Admitting: Internal Medicine

## 2014-12-05 ENCOUNTER — Encounter (HOSPITAL_COMMUNITY): Payer: Self-pay

## 2014-12-05 ENCOUNTER — Encounter: Payer: Self-pay | Admitting: Internal Medicine

## 2014-12-05 VITALS — BP 173/87 | HR 80 | Temp 98.3°F | Wt 232.5 lb

## 2014-12-05 DIAGNOSIS — I10 Essential (primary) hypertension: Secondary | ICD-10-CM | POA: Diagnosis not present

## 2014-12-05 DIAGNOSIS — I5032 Chronic diastolic (congestive) heart failure: Secondary | ICD-10-CM | POA: Diagnosis not present

## 2014-12-05 DIAGNOSIS — Z7902 Long term (current) use of antithrombotics/antiplatelets: Secondary | ICD-10-CM | POA: Diagnosis not present

## 2014-12-05 DIAGNOSIS — Z794 Long term (current) use of insulin: Secondary | ICD-10-CM

## 2014-12-05 DIAGNOSIS — I130 Hypertensive heart and chronic kidney disease with heart failure and stage 1 through stage 4 chronic kidney disease, or unspecified chronic kidney disease: Secondary | ICD-10-CM | POA: Diagnosis not present

## 2014-12-05 DIAGNOSIS — E785 Hyperlipidemia, unspecified: Secondary | ICD-10-CM | POA: Diagnosis present

## 2014-12-05 DIAGNOSIS — Z7951 Long term (current) use of inhaled steroids: Secondary | ICD-10-CM | POA: Diagnosis not present

## 2014-12-05 DIAGNOSIS — D631 Anemia in chronic kidney disease: Secondary | ICD-10-CM | POA: Diagnosis present

## 2014-12-05 DIAGNOSIS — G4733 Obstructive sleep apnea (adult) (pediatric): Secondary | ICD-10-CM | POA: Diagnosis present

## 2014-12-05 DIAGNOSIS — R0603 Acute respiratory distress: Secondary | ICD-10-CM

## 2014-12-05 DIAGNOSIS — N183 Chronic kidney disease, stage 3 unspecified: Secondary | ICD-10-CM

## 2014-12-05 DIAGNOSIS — I429 Cardiomyopathy, unspecified: Secondary | ICD-10-CM | POA: Diagnosis present

## 2014-12-05 DIAGNOSIS — J9600 Acute respiratory failure, unspecified whether with hypoxia or hypercapnia: Secondary | ICD-10-CM | POA: Diagnosis present

## 2014-12-05 DIAGNOSIS — I5043 Acute on chronic combined systolic (congestive) and diastolic (congestive) heart failure: Principal | ICD-10-CM | POA: Diagnosis present

## 2014-12-05 DIAGNOSIS — N179 Acute kidney failure, unspecified: Secondary | ICD-10-CM | POA: Diagnosis present

## 2014-12-05 DIAGNOSIS — I129 Hypertensive chronic kidney disease with stage 1 through stage 4 chronic kidney disease, or unspecified chronic kidney disease: Secondary | ICD-10-CM | POA: Diagnosis present

## 2014-12-05 DIAGNOSIS — E1121 Type 2 diabetes mellitus with diabetic nephropathy: Secondary | ICD-10-CM | POA: Diagnosis present

## 2014-12-05 DIAGNOSIS — E1142 Type 2 diabetes mellitus with diabetic polyneuropathy: Secondary | ICD-10-CM | POA: Diagnosis present

## 2014-12-05 DIAGNOSIS — I252 Old myocardial infarction: Secondary | ICD-10-CM

## 2014-12-05 DIAGNOSIS — Z8673 Personal history of transient ischemic attack (TIA), and cerebral infarction without residual deficits: Secondary | ICD-10-CM

## 2014-12-05 DIAGNOSIS — E11319 Type 2 diabetes mellitus with unspecified diabetic retinopathy without macular edema: Secondary | ICD-10-CM | POA: Diagnosis present

## 2014-12-05 DIAGNOSIS — J810 Acute pulmonary edema: Secondary | ICD-10-CM | POA: Diagnosis not present

## 2014-12-05 DIAGNOSIS — E1122 Type 2 diabetes mellitus with diabetic chronic kidney disease: Secondary | ICD-10-CM

## 2014-12-05 DIAGNOSIS — R0600 Dyspnea, unspecified: Secondary | ICD-10-CM | POA: Diagnosis present

## 2014-12-05 DIAGNOSIS — I161 Hypertensive emergency: Secondary | ICD-10-CM | POA: Diagnosis present

## 2014-12-05 DIAGNOSIS — J80 Acute respiratory distress syndrome: Secondary | ICD-10-CM | POA: Diagnosis not present

## 2014-12-05 LAB — BASIC METABOLIC PANEL
Anion gap: 9 (ref 5–15)
BUN: 31 mg/dL — ABNORMAL HIGH (ref 6–23)
CALCIUM: 8.7 mg/dL (ref 8.4–10.5)
CO2: 22 mmol/L (ref 19–32)
Chloride: 108 mmol/L (ref 96–112)
Creatinine, Ser: 2.41 mg/dL — ABNORMAL HIGH (ref 0.50–1.35)
GFR calc Af Amer: 33 mL/min — ABNORMAL LOW (ref >90)
GFR calc non Af Amer: 28 mL/min — ABNORMAL LOW (ref >90)
Glucose, Bld: 120 mg/dL — ABNORMAL HIGH (ref 70–99)
Potassium: 4.5 mmol/L (ref 3.5–5.1)
Sodium: 139 mmol/L (ref 135–145)

## 2014-12-05 LAB — CBC
HEMATOCRIT: 26.8 % — AB (ref 39.0–52.0)
Hemoglobin: 8.6 g/dL — ABNORMAL LOW (ref 13.0–17.0)
MCH: 27.9 pg (ref 26.0–34.0)
MCHC: 32.1 g/dL (ref 30.0–36.0)
MCV: 87 fL (ref 78.0–100.0)
PLATELETS: 296 10*3/uL (ref 150–400)
RBC: 3.08 MIL/uL — AB (ref 4.22–5.81)
RDW: 14.8 % (ref 11.5–15.5)
WBC: 9.6 10*3/uL (ref 4.0–10.5)

## 2014-12-05 LAB — BRAIN NATRIURETIC PEPTIDE: B Natriuretic Peptide: 797.6 pg/mL — ABNORMAL HIGH (ref 0.0–100.0)

## 2014-12-05 LAB — I-STAT TROPONIN, ED: Troponin i, poc: 0.04 ng/mL (ref 0.00–0.08)

## 2014-12-05 MED ORDER — FUROSEMIDE 40 MG PO TABS
60.0000 mg | ORAL_TABLET | Freq: Every day | ORAL | Status: DC
Start: 2014-12-05 — End: 2014-12-08

## 2014-12-05 MED ORDER — NITROGLYCERIN 2 % TD OINT
1.0000 [in_us] | TOPICAL_OINTMENT | Freq: Once | TRANSDERMAL | Status: AC
  Administered 2014-12-05: 1 [in_us] via TOPICAL
  Filled 2014-12-05: qty 1

## 2014-12-05 MED ORDER — FUROSEMIDE 10 MG/ML IJ SOLN
80.0000 mg | Freq: Once | INTRAMUSCULAR | Status: AC
  Administered 2014-12-05: 80 mg via INTRAVENOUS
  Filled 2014-12-05: qty 8

## 2014-12-05 MED ORDER — NITROGLYCERIN 2 % TD OINT
0.5000 [in_us] | TOPICAL_OINTMENT | Freq: Once | TRANSDERMAL | Status: AC
  Administered 2014-12-05: 0.5 [in_us] via TOPICAL
  Filled 2014-12-05: qty 1

## 2014-12-05 MED ORDER — LABETALOL HCL 5 MG/ML IV SOLN
10.0000 mg | Freq: Once | INTRAVENOUS | Status: AC
  Administered 2014-12-05: 10 mg via INTRAVENOUS
  Filled 2014-12-05: qty 4

## 2014-12-05 MED ORDER — FUROSEMIDE 10 MG/ML IJ SOLN
40.0000 mg | Freq: Once | INTRAMUSCULAR | Status: AC
  Administered 2014-12-05: 40 mg via INTRAVENOUS
  Filled 2014-12-05: qty 4

## 2014-12-05 MED ORDER — CLONIDINE HCL 0.3 MG PO TABS
0.3000 mg | ORAL_TABLET | Freq: Two times a day (BID) | ORAL | Status: DC
  Administered 2014-12-06 – 2014-12-08 (×6): 0.3 mg via ORAL
  Filled 2014-12-05 (×7): qty 1

## 2014-12-05 MED ORDER — HYDRALAZINE HCL 20 MG/ML IJ SOLN
10.0000 mg | INTRAMUSCULAR | Status: AC
  Administered 2014-12-05: 10 mg via INTRAVENOUS
  Filled 2014-12-05: qty 1

## 2014-12-05 MED ORDER — NITROGLYCERIN IN D5W 200-5 MCG/ML-% IV SOLN
5.0000 ug/min | INTRAVENOUS | Status: DC
  Administered 2014-12-05: 5 ug/min via INTRAVENOUS
  Filled 2014-12-05: qty 250

## 2014-12-05 MED ORDER — LABETALOL HCL 5 MG/ML IV SOLN
20.0000 mg | INTRAVENOUS | Status: DC | PRN
  Administered 2014-12-05: 20 mg via INTRAVENOUS

## 2014-12-05 NOTE — ED Notes (Signed)
Admitting MD at the bedside, Nitro gtt increased by 10 mcg per their MD.

## 2014-12-05 NOTE — ED Notes (Signed)
This RN paged respiratory to place pt back on Bipap.

## 2014-12-05 NOTE — Patient Instructions (Addendum)
-  Start taking lasix 80 mg daily until your shortness of breath and swelling improve, then start taking 60 mg daily  -Keep checking your weight daily and limit salt intake -Please follow-up with your cardiologist on Monday and the kidney doctor in 2 weeks -Please come back in 1-2 weeks  -Please go to the ED if you having worsening shortness of breath and swelling  -Nice meeting you today!!     General Instructions:   Please bring your medicines with you each time you come to clinic.  Medicines may include prescription medications, over-the-counter medications, herbal remedies, eye drops, vitamins, or other pills.   Progress Toward Treatment Goals:  Treatment Goal 12/03/2014  Hemoglobin A1C deteriorated  Blood pressure improved    Self Care Goals & Plans:  Self Care Goal 12/03/2014  Manage my medications take my medicines as prescribed; refill my medications on time; bring my medications to every visit  Monitor my health keep track of my blood glucose; bring my glucose meter and log to each visit; keep track of my blood pressure  Eat healthy foods eat more vegetables; eat foods that are low in salt; eat baked foods instead of fried foods  Be physically active find an activity I enjoy; take a walk every day  Meeting treatment goals -    Home Blood Glucose Monitoring 12/03/2014  Check my blood sugar 2 times a day  When to check my blood sugar before breakfast; before dinner     Care Management & Community Referrals:  Referral 11/26/2014  Referrals made for care management support pharmacy clinic  Referrals made to community resources -

## 2014-12-05 NOTE — H&P (Signed)
PULMONARY  / CRITICAL CARE MEDICINE  Name: Charles Hart MRN: [name] DOB: 1955/08/26    LOS: 0  REFERRING MD :  ED  CHIEF COMPLAINT:  dyspnea  HISTORY OF PRESENT ILLNESS:  Charles Hart is a 58 year old male with grade 2 diastolic dysfunction CHF, type 2 diabetes complicated by neuropathy, CKD Stage 3 with nephrotic syndrome, h/o CVA, chronic normocytic anemia, OSA who presented to the ED with worsening dyspnea and leg edema. His wife at bedside reports worsening leg edema overnight with development of shortness of breath. He presented to his office visit earlier today and was instructed to take Lasix x 2 tablets but felt his symptoms worsen upon going home, so he presented to the ED. He denies any recent illness, non-adherence to medication, chest pain, worsening orthopnea, cough, fever. Per chart review, he was hospitalized 3/31-4/6 for AKI with weight 215 lbs at discharge that increased to 232 lbs over several office visits this month with noted non-adherence to medication and refusal to have bloodwork done. In the ED, systolic BP was in the 123456, and he received Lasix 80mg  IV, labetalol 10mg , hydralazine 10mg . He was started on BiPAP.  PAST MEDICAL HISTORY :  Past Medical History  Diagnosis Date  . Dermatitis   . CHF (congestive heart failure)     LV function improved from 2004 to 2008.  Historically, moderately dilated LV with EF 30-40% by 2D echo 08/14/2002.  Mild CAD with severe LV dysfunction by cardiac cath 09/2002.  Normal coronary arteries and normal LV function by cardiac cath 09/19/2006.  A 2-D echo on 04/01/2009 showed mild concentric hypertrophy and normal systolic (LVEF  123456) and doppler C/W with grade 1 diastolic dysfunction..   . Hypertension   . Hyperlipidemia   . Hearing loss in right ear   . Cardiomyopathy     LV function improved from 2004 to 2008.  Historically, moderately dilated LV with EF 30-40% by 2D echo 08/14/2002.  Mild CAD with severe LV dysfunction by  cardiac cath 09/2002.  Normal coronary arteries and normal LV function by cardiac cath 09/19/2006.  A 2-D echo on 04/01/2009 showed mild concentric hypertrophy and normal systolic (LVEF  123456) and doppler C/W with grade 1 diastolic dysfunction.  . DM neuropathy, painful   . CVA (cerebral vascular accident) 07/04/2012    MRI of the brain 07/04/2012 showed an acute infarct in the right basal ganglia involving the anterior putamen, anterior limb internal capsule, and head of the caudate; this measured approximately 2.5 cm in diameter.     . Hypertensive crisis 07/28/2012  . Myocardial infarction   . Diabetes mellitus     type 2  . Hypertensive urgency 08/20/2014  . Nephrotic syndrome 02/18/2013    A 24-hour urine collection 03/04/2013 showed total protein of 5,460 g and creatinine clearance of 80 mL/minute.  Patient was seen by Seward Meth at [place] and a repeat 24-hour urine showed 10,407 mg protein.  Patient underwent kidney biopsy on 05/30/2013; pathology showed advanced diffuse and nodular diabetic nephropathy with vascular changes consistent with long-standing difficult to control hypertension.     . Adenomatous colon polyp 07/02/2011    Last colonoscopy May 06, 2011 by Dr. Owens Loffler, who recommended repeat colonoscopy in 5 years.   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 Background diabetic retinopathy 04/20/2012    Patient is followed by Dr. Katy Fitch    Past  Surgical History  Procedure Laterality Date  . Colonoscopy    . Polypectomy    . Cardiac catheterization      3 times  . Foot surgery     Prior to Admission medications   Medication Sig Start Date End Date Taking? Authorizing Provider  acetaminophen (TYLENOL) 500 MG tablet Take 500 mg by mouth every 6 (six) hours as needed (pain).    Yes Historical Provider, MD  cloNIDine (CATAPRES) 0.3 MG tablet Take 1 tablet (0.3 mg total) by mouth 2  (two) times daily. 09/02/14  Yes Langley Gauss Moding, MD  clopidogrel (PLAVIX) 75 MG tablet Take 1 tablet (75 mg total) by mouth daily. 11/26/14  Yes Lucious Groves, DO  diltiazem (CARDIZEM CD) 180 MG 24 hr capsule Take 1 capsule (180 mg total) by mouth daily. 12/03/14 12/03/15 Yes Carly Montey Hora, MD  furosemide (LASIX) 40 MG tablet Take 1.5 tablets (60 mg total) by mouth daily. 12/05/14 12/05/15 Yes Marjan Rabbani, MD  gabapentin (NEURONTIN) 300 MG capsule Take 1 capsule (300 mg total) by mouth every morning. Patient taking differently: Take 300 mg by mouth at bedtime.  11/26/14  Yes Lucious Groves, DO  hydrALAZINE (APRESOLINE) 50 MG tablet Take 100 mg by mouth 3 (three) times daily.   Yes Historical Provider, MD  insulin aspart (NOVOLOG FLEXPEN) 100 UNIT/ML FlexPen Inject 7 Units into the skin 3 (three) times daily with meals. Or as directed using sliding scale instructions. 11/26/14  Yes Bertha Stakes, MD  Insulin Glargine (LANTUS) 100 UNIT/ML Solostar Pen Inject 30 Units into the skin at bedtime. 11/04/14  Yes Bertha Stakes, MD  labetalol (NORMODYNE) 300 MG tablet Take 300 mg by mouth 2 (two) times daily.   Yes Historical Provider, MD  Multiple Vitamin (MULITIVITAMIN WITH MINERALS) TABS Take 1 tablet by mouth every morning.    Yes Historical Provider, MD  pravastatin (PRAVACHOL) 40 MG tablet Take 1 tablet (40 mg total) by mouth daily. 11/26/14  Yes Lucious Groves, DO  ACCU-CHEK FASTCLIX LANCETS MISC Use to check blood sugar as directed 3 times a day. Dx code E11.65, on insulin. 11/26/14   Bertha Stakes, MD  ACCU-CHEK SMARTVIEW test strip CHECK BLOOD SUGAR LEVELS UP TO THREE TIMES DAILY AS DIRECTED 10/31/14   Bertha Stakes, MD  calcium-vitamin D (OSCAL WITH D) 500-200 MG-UNIT per tablet Take 1 tablet by mouth 2 (two) times daily. Patient not taking: Reported on 12/05/2014 11/19/14   Kelby Aline, MD  fluticasone [facility]) 50 MCG/ACT nasal spray Place 2 sprays into both nostrils daily as needed for allergies or  rhinitis.    Historical Provider, MD  Insulin Pen Needle 31G X 5 MM MISC 1 Units by Does not apply route 4 (four) times daily as needed. 12/03/14   Bartholomew Crews, MD   Allergies  Allergen Reactions  . Amlodipine Swelling  . Lisinopril Cough    FAMILY HISTORY:  Family History  Problem Relation Age of Onset  . Colon cancer Sister   . Aneurysm Father 38    died of rupture   SOCIAL HISTORY:  reports that he has never smoked. He has never used smokeless tobacco. He reports that he drinks alcohol. He reports that he does not use illicit drugs.  REVIEW OF SYSTEMS:  As noted in the HPI  VITAL SIGNS: Temp:  [98.2 F (36.8 C)-98.3 F (36.8 C)] 98.2 F (36.8 C) (04/22 1730) Pulse Rate:  [80-109] 87 (04/22 2315) Resp:  [14-40] 26 (04/22 2315) BP: (173-221)/(87-117)  193/99 mmHg (04/22 2315) SpO2:  [80 %-100 %] 100 % (04/22 2315) FiO2 (%):  [40 %] 40 % (04/22 2024) Weight:  [226 lb (102.513 kg)-232 lb 8 oz (105.461 kg)] 226 lb (102.513 kg) (04/22 2035) HEMODYNAMICS:   VENTILATOR SETTINGS: Vent Mode:  [-] BIPAP FiO2 (%):  [40 %] 40 % Set Rate:  [10 bmp] 10 bmp INTAKE / OUTPUT: Intake/Output      04/22 0701 - 04/23 0700   Urine (mL/kg/hr) 1000   Total Output 1000   Net -1000         PHYSICAL EXAMINATION: General: African American male wearing BiPAP mask  Neuro: responds to questions appropriately; moving all extremities freely HEENT: PERRL, EOMI Cardiac: RRR, no rubs, murmurs or gallops Pulm: clear to auscultation bilaterally in the anterior lung fields, no wheezes, rales, or rhonchi Abd: soft, nontender, nondistended, BS present Ext: warm and well perfused, 2+ pitting edema BLE    LABS: Cbc  Recent Labs Lab 12/05/14 1838  WBC 9.6  HGB 8.6*  HCT 26.8*  PLT 296    Chemistry   Recent Labs Lab 12/05/14 1838  NA 139  K 4.5  CL 108  CO2 22  BUN 31*  CREATININE 2.41*  CALCIUM 8.7  GLUCOSE 120*    CBG trend  Recent Labs Lab 12/03/14 0831   GLUCAP 158*    IMAGING:  ECG:  DIAGNOSES: Active Problems:   Hypertensive emergency   ASSESSMENT / PLAN:  PULMONARY  ASSESSMENT: Acute respiratory failure: likely 2/2 hypertensive emergency; acute CHF exacerbation less likely given reassuring BNP; EKG findings reassuring against ACS; AKI unlikely given baseline Crt OSA  PLAN:   Give Lasix 40mg  IV Wean BiPAP as tolerated  CARDIOVASCULAR  ASSESSMENT:  CHF: grade 2 diastolic dysfunction [echo 03/20/13] Hypertensive emergency  PLAN:  Give ASA Start nitroglycerin gtt Restart home clonidine 0.3mg  BID Recheck echo Follow troponins Monitor with telemetry Holding diltiazem, pravastatin  RENAL  ASSESSMENT:   CKD Stage 3 with nephrotic syndrome: 2/2 DM2 as noted on renal biopsy 05/30/2013, baseline Crt 2.0-2.2.  PLAN:   Lasix as noted above  GASTROINTESTINAL  ASSESSMENT:   No acute issues.  PLAN:   Continue following  HEMATOLOGIC  ASSESSMENT:   Chronic normocytic anemia: likely 2/2 CKD, baseline Hb 8-10  PLAN:  Continue following  INFECTIOUS  ASSESSMENT:   No acute issues.  PLAN:   Continue following  ENDOCRINE  ASSESSMENT:   DM2: A1c 7.7, March 2016    PLAN:   SSI  NEUROLOGIC  ASSESSMENT:   H/o CVA: no acute neurologic deficits Diabetic neuropathy  PLAN:   Continue following Holding gabapentin, Plavix  MSK  ASSESSMENT: Back pain  PLAN: Give morphine 2mg  q2h prn   CLINICAL SUMMARY: Mr. Perry is a 59 year old male with grade 2 diastolic dysfunction CHF, type 2 diabetes complicated by neuropathy, CKD Stage 3 with nephrotic syndrome, h/o CVA, chronic normocytic anemia, OSA who presented with pulmonary edema 2/2 hypertensive urgency. Admit to stepdown.  Charlott Rakes, PGY1 Internal Medicine Pager: [phone]  12/05/2014, 11:46 PM

## 2014-12-05 NOTE — Addendum Note (Signed)
Addended byJuluis Mire on: 12/05/2014 09:04 PM   Modules accepted: Level of Service

## 2014-12-05 NOTE — Progress Notes (Signed)
Patient ID: Charles Hart, male   DOB: 03-31-1956, 59 y.o.   MRN: [name]     Subjective:   Patient ID: Charles Hart male   DOB: 1956-05-26 59 y.o.   MRN: [name]  HPI: [name] is a 59 y.o. man with past medical history of hypertension, hyperlipidemia, insulin-dependent Type 2 DM, CKD Stage 3, nephrotic syndrome, CVA, chronic grade 2 diastolic CHF, chronic normocytic anemia, and OSA who presents with chief complaint of dyspnea with exertion and worsening bilateral LE edema.   He was recently seen in clinic two days ago and was started on cardizem 180 mg daily for uncontrolled hypertension (155/76) which he reports compliance with in addition to hydralazine 100 mg TID, labetalol 300 mg BID, and clonidine 0.3 mg BID. He refused blood draw at that visit and renal function could not be determined in order to restart his previously held HCTZ and losartan.   He reports bilateral LE edema that began after recent hospitalization from 3/31-4/6 for which he sustained AKI on CKD Stage 3 thought to be due to vancomycin toxicity and contrast-induced nephropathy with peak creatine of 5.72 from baseline 2. On discharge he was instructed to discontinue HCTZ 25 mg daily and losartan 100 mg daily until follow-up visit. His last Cr was 3.06 on 4/13. He also has history of nephrotic syndrome due to diabetes and uncontrolled hypertension (per kidney biopsy) with protenuria of 3.85 g on 11/18/14. He reports normal urine output with no hematuria. He has since hosptial discharge been off HCTZ. He was previously on lasix 60 mg daily which he reports controlled his swelling.   He reports recent dyspnea with exertion in the past few days and unable to walk but a few blocks without getting short of breath. He denies chest pain, orthopnea or PND but reports significant weight gain, 9 lb in last 2 days and up 17 lb from baseline weight of 215 lbs. He has abdominal distension as well. He denies dietary  indiscretion. He is to see his cardiologist on Monday and nephrologist in 2 weeks.     Past Medical History  Diagnosis Date  . Dermatitis   . CHF (congestive heart failure)     LV function improved from 2004 to 2008.  Historically, moderately dilated LV with EF 30-40% by 2D echo 08/14/2002.  Mild CAD with severe LV dysfunction by cardiac cath 09/2002.  Normal coronary arteries and normal LV function by cardiac cath 09/19/2006.  A 2-D echo on 04/01/2009 showed mild concentric hypertrophy and normal systolic (LVEF  123456) and doppler C/W with grade 1 diastolic dysfunction..   . Hypertension   . Hyperlipidemia   . Hearing loss in right ear   . Cardiomyopathy     LV function improved from 2004 to 2008.  Historically, moderately dilated LV with EF 30-40% by 2D echo 08/14/2002.  Mild CAD with severe LV dysfunction by cardiac cath 09/2002.  Normal coronary arteries and normal LV function by cardiac cath 09/19/2006.  A 2-D echo on 04/01/2009 showed mild concentric hypertrophy and normal systolic (LVEF  123456) and doppler C/W with grade 1 diastolic dysfunction.  . DM neuropathy, painful   . CVA (cerebral vascular accident) 07/04/2012    MRI of the brain 07/04/2012 showed an acute infarct in the right basal ganglia involving the anterior putamen, anterior limb internal capsule, and head of the caudate; this measured approximately 2.5 cm in diameter.     . Hypertensive crisis 07/28/2012  . Myocardial infarction   .  Diabetes mellitus     type 2  . Hypertensive urgency 08/20/2014  . Nephrotic syndrome 02/18/2013    A 24-hour urine collection 03/04/2013 showed total protein of 5,460 g and creatinine clearance of 80 mL/minute.  Patient was seen by Seward Meth at [place] and a repeat 24-hour urine showed 10,407 mg protein.  Patient underwent kidney biopsy on 05/30/2013; pathology showed advanced diffuse and nodular diabetic nephropathy with vascular changes consistent with  long-standing difficult to control hypertension.     . Adenomatous colon polyp 07/02/2011    Last colonoscopy May 06, 2011 by Dr. Owens Loffler, who recommended repeat colonoscopy in 5 years.   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 Background diabetic retinopathy 04/20/2012    Patient is followed by Dr. Katy Fitch    Current Outpatient Prescriptions  Medication Sig Dispense Refill  . ACCU-CHEK FASTCLIX LANCETS MISC Use to check blood sugar as directed 3 times a day. Dx code E11.65, on insulin. 102 each 2  . ACCU-CHEK SMARTVIEW test strip CHECK BLOOD SUGAR LEVELS UP TO THREE TIMES DAILY AS DIRECTED 100 each 5  . acetaminophen (TYLENOL) 500 MG tablet Take 500 mg by mouth every 6 (six) hours as needed.    . calcium-vitamin D (OSCAL WITH D) 500-200 MG-UNIT per tablet Take 1 tablet by mouth 2 (two) times daily. 60 tablet 0  . cloNIDine (CATAPRES) 0.3 MG tablet Take 1 tablet (0.3 mg total) by mouth 2 (two) times daily. 60 tablet 11  . clopidogrel (PLAVIX) 75 MG tablet Take 1 tablet (75 mg total) by mouth daily. 90 tablet 3  . diltiazem (CARDIZEM CD) 180 MG 24 hr capsule Take 1 capsule (180 mg total) by mouth daily. 30 capsule 3  . fluticasone (FLONASE) 50 MCG/ACT nasal spray Place 2 sprays into both nostrils daily as needed for allergies or rhinitis.    [name] gabapentin (NEURONTIN) 300 MG capsule Take 1 capsule (300 mg total) by mouth every morning. (Patient taking differently: Take 300 mg by mouth at bedtime. ) 90 capsule 1  . hydrALAZINE (APRESOLINE) 50 MG tablet Take 100 mg by mouth 3 (three) times daily.    . insulin aspart (NOVOLOG FLEXPEN) 100 UNIT/ML FlexPen Inject 7 Units into the skin 3 (three) times daily with meals. Or as directed using sliding scale instructions. 15 mL 1  . Insulin Glargine (LANTUS) 100 UNIT/ML Solostar Pen Inject 30 Units into the skin at bedtime. 15 mL 1  . Insulin Pen Needle 31G X 5 MM MISC 1 Units by Does not  apply route 4 (four) times daily as needed. 120 each 3  . labetalol (NORMODYNE) 300 MG tablet Take 300 mg by mouth 2 (two) times daily.    . Multiple Vitamin (MULITIVITAMIN WITH MINERALS) TABS Take 1 tablet by mouth every morning.     . pravastatin (PRAVACHOL) 40 MG tablet Take 1 tablet (40 mg total) by mouth daily. 90 tablet 3   No current facility-administered medications for this visit.   Family History  Problem Relation Age of Onset  . Colon cancer Sister   . Aneurysm Father 34    died of rupture   History   Social History  . Marital Status: Married    Spouse Name: N/A  . Number of Children: N/A  . Years of Education: N/A   Social History Main Topics  . Smoking status: Never Smoker   . Smokeless tobacco: Never  Used  . Alcohol Use: 0.0 oz/week    0 Standard drinks or equivalent per week     Comment: Wine occasionally (no more than 2 glasses per month)  . Drug Use: No  . Sexual Activity: Not on file   Other Topics Concern  . None   Social History Narrative   Review of Systems: Review of Systems  Constitutional: Negative for fever and chills.       Weight gain  Respiratory: Positive for shortness of breath (with exertion). Negative for cough and wheezing.   Cardiovascular: Positive for leg swelling (up to thighs). Negative for chest pain, orthopnea and PND.  Gastrointestinal: Positive for abdominal pain (Distension). Negative for nausea, vomiting, diarrhea and constipation.  Genitourinary: Negative for dysuria, urgency, frequency and hematuria.  Neurological: Negative for dizziness.     Objective:  Physical Exam: Filed Vitals:   12/05/14 1355  BP: 173/87  Pulse: 80  Temp: 98.3 F (36.8 C)  TempSrc: Oral  Weight: 232 lb 8 oz (105.461 kg)  SpO2: 96%    Physical Exam  Constitutional: He is oriented to person, place, and time. He appears well-developed and well-nourished. No distress.  HENT:  Head: Normocephalic and atraumatic.  Eyes: EOM are normal.    Neck: Normal range of motion. Neck supple. JVD present.  Cardiovascular: Normal rate and regular rhythm.   Pulmonary/Chest: Effort normal. No respiratory distress. He has no wheezes. He has rales (bilaterally).  Abdominal: Soft. Bowel sounds are normal. He exhibits distension (tense ). There is no tenderness. There is no rebound and no guarding.  Musculoskeletal: Normal range of motion. He exhibits edema (+3 b/l LE edema).  Neurological: He is alert and oriented to person, place, and time.  Skin: He is not diaphoretic.    Assessment & Plan:   Please see problem list for problem-based assessment and plan

## 2014-12-05 NOTE — Assessment & Plan Note (Addendum)
Assessment:  Pt with chronic grade 2 diastolic CHF with last 2D-echo 03/20/13 and nephrotic syndrome off diuretic therapy for more than 2 weeks who presents with DOE, diffuse crackles, abdominal swelling, and worsening +3 pitting LE edema in setting of acute exacerbation with probable pulmonary edema.  Plan:  -SpO2 96% on RA -Wt 232 lb up from baseline 215 lb -Pt repeatedly declined admission stating he was just in the hospital and did not want to return -Pt and his wife were instructed to go to the ED ASAP if his dyspnea worsens or does not improve -Pt refused obtaining lab work   -Pt was given sample of lasix 40 mg which he was instructed to take 80 mg daily until symptoms improve and then 60 mg daily as he was previously euvolemic on this dose. He is to follow-up with cardiology on Monday 4/25 with further adjustment whom I  will route my note to.   -Pt instructed to weight himself daily and limit salt and fluid intake

## 2014-12-05 NOTE — ED Provider Notes (Signed)
CSN: [id number]     Arrival date & time 12/05/14  1720 History   First MD Initiated Contact with Patient 12/05/14 1824     Chief Complaint  Patient presents with  . Congestive Heart Failure     (Consider location/radiation/quality/duration/timing/severity/associated sxs/prior Treatment) HPI Patient presents with concern of dyspnea. Symptoms began over the past few days, subtly. Symptoms have progressed, and the patient has minimal capacity to perform activities of daily living currently. Recently the patient was stopped from taking his hydrocodone thiazide secondary to kidney involvement. Today the patient was advised to begin taking Lasix. He has not begun this medication. He denies chest pain, belly pain, nausea, vomiting. He acknowledges chronic back pain, no change. No syncope. No relief with anything, throughout the progression of his dyspnea.  Past Medical History  Diagnosis Date  . Dermatitis   . CHF (congestive heart failure)     LV function improved from 2004 to 2008.  Historically, moderately dilated LV with EF 30-40% by 2D echo 08/14/2002.  Mild CAD with severe LV dysfunction by cardiac cath 09/2002.  Normal coronary arteries and normal LV function by cardiac cath 09/19/2006.  A 2-D echo on 04/01/2009 showed mild concentric hypertrophy and normal systolic (LVEF  123456) and doppler C/W with grade 1 diastolic dysfunction..   . Hypertension   . Hyperlipidemia   . Hearing loss in right ear   . Cardiomyopathy     LV function improved from 2004 to 2008.  Historically, moderately dilated LV with EF 30-40% by 2D echo 08/14/2002.  Mild CAD with severe LV dysfunction by cardiac cath 09/2002.  Normal coronary arteries and normal LV function by cardiac cath 09/19/2006.  A 2-D echo on 04/01/2009 showed mild concentric hypertrophy and normal systolic (LVEF  123456) and doppler C/W with grade 1 diastolic dysfunction.  . DM neuropathy, painful   . CVA (cerebral vascular accident) 07/04/2012     MRI of the brain 07/04/2012 showed an acute infarct in the right basal ganglia involving the anterior putamen, anterior limb internal capsule, and head of the caudate; this measured approximately 2.5 cm in diameter.     . Hypertensive crisis 07/28/2012  . Myocardial infarction   . Diabetes mellitus     type 2  . Hypertensive urgency 08/20/2014  . Nephrotic syndrome 02/18/2013    A 24-hour urine collection 03/04/2013 showed total protein of 5,460 g and creatinine clearance of 80 mL/minute.  Patient was seen by Seward Meth at [facility] and a repeat 24-hour urine showed 10,407 mg protein.  Patient underwent kidney biopsy on 05/30/2013; pathology showed advanced diffuse and nodular diabetic nephropathy with vascular changes consistent with long-standing difficult to control hypertension.     . Adenomatous colon polyp 07/02/2011    Last colonoscopy May 06, 2011 by Dr. Owens Loffler, who recommended repeat colonoscopy in 5 years.   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 Background diabetic retinopathy 04/20/2012    Patient is followed by Dr. Katy Fitch    Past Surgical History  Procedure Laterality Date  . Colonoscopy    . Polypectomy    . Cardiac catheterization      3 times  . Foot surgery     Family History  Problem Relation Age of Onset  . Colon cancer Sister   . Aneurysm Father 59    died of rupture   History  Substance Use Topics  . Smoking status:  Never Smoker   . Smokeless tobacco: Never Used  . Alcohol Use: 0.0 oz/week    0 Standard drinks or equivalent per week     Comment: Wine occasionally (no more than 2 glasses per month)    Review of Systems  Constitutional:       Per HPI, otherwise negative  HENT:       Per HPI, otherwise negative  Respiratory:       Per HPI, otherwise negative  Cardiovascular:       Per HPI, otherwise negative  Gastrointestinal: Negative for vomiting.   Endocrine:       Negative aside from HPI  Genitourinary:       Neg aside from HPI   Musculoskeletal:       Per HPI, otherwise negative  Skin: Negative.   Neurological: Negative for syncope.      Allergies  Amlodipine and Lisinopril  Home Medications   Prior to Admission medications   Medication Sig Start Date End Date Taking? Authorizing Provider  ACCU-CHEK FASTCLIX LANCETS MISC Use to check blood sugar as directed 3 times a day. Dx code E11.65, on insulin. 11/26/14   Bertha Stakes, MD  ACCU-CHEK SMARTVIEW test strip CHECK BLOOD SUGAR LEVELS UP TO THREE TIMES DAILY AS DIRECTED 10/31/14   Bertha Stakes, MD  acetaminophen (TYLENOL) 500 MG tablet Take 500 mg by mouth every 6 (six) hours as needed.    Historical Provider, MD  calcium-vitamin D (OSCAL WITH D) 500-200 MG-UNIT per tablet Take 1 tablet by mouth 2 (two) times daily. 11/19/14   Kelby Aline, MD  cloNIDine (CATAPRES) 0.3 MG tablet Take 1 tablet (0.3 mg total) by mouth 2 (two) times daily. 09/02/14   Charlesetta Shanks, MD  clopidogrel (PLAVIX) 75 MG tablet Take 1 tablet (75 mg total) by mouth daily. 11/26/14   Lucious Groves, DO  diltiazem (CARDIZEM CD) 180 MG 24 hr capsule Take 1 capsule (180 mg total) by mouth daily. 12/03/14 12/03/15  Carly J Rivet, MD  fluticasone (FLONASE) 50 MCG/ACT nasal spray Place 2 sprays into both nostrils daily as needed for allergies or rhinitis.    Historical Provider, MD  furosemide (LASIX) 40 MG tablet Take 1.5 tablets (60 mg total) by mouth daily. 12/05/14 12/05/15  Juluis Mire, MD  gabapentin (NEURONTIN) 300 MG capsule Take 1 capsule (300 mg total) by mouth every morning. Patient taking differently: Take 300 mg by mouth at bedtime.  11/26/14   Lucious Groves, DO  hydrALAZINE (APRESOLINE) 50 MG tablet Take 100 mg by mouth 3 (three) times daily.    Historical Provider, MD  insulin aspart (NOVOLOG FLEXPEN) 100 UNIT/ML FlexPen Inject 7 Units into the skin 3 (three) times daily with meals. Or as directed  using sliding scale instructions. 11/26/14   Bertha Stakes, MD  Insulin Glargine (LANTUS) 100 UNIT/ML Solostar Pen Inject 30 Units into the skin at bedtime. 11/04/14   Bertha Stakes, MD  Insulin Pen Needle 31G X 5 MM MISC 1 Units by Does not apply route 4 (four) times daily as needed. 12/03/14   Bartholomew Crews, MD  labetalol (NORMODYNE) 300 MG tablet Take 300 mg by mouth 2 (two) times daily.    Historical Provider, MD  Multiple Vitamin (MULITIVITAMIN WITH MINERALS) TABS Take 1 tablet by mouth every morning.     Historical Provider, MD  pravastatin (PRAVACHOL) 40 MG tablet Take 1 tablet (40 mg total) by mouth daily. 11/26/14   Lucious Groves, DO   BP 206/98  mmHg  Pulse 99  Temp(Src) 98.2 F (36.8 C) (Oral)  Resp 22  SpO2 80% Physical Exam  Constitutional: He is oriented to person, place, and time. He appears well-developed. He appears distressed.  HENT:  Head: Normocephalic and atraumatic.  Eyes: Conjunctivae and EOM are normal.  Cardiovascular: Regular rhythm.  Tachycardia present.   Pulmonary/Chest: Accessory muscle usage present. No stridor. Tachypnea noted. He is in respiratory distress. He has decreased breath sounds. He has wheezes.  Abdominal: He exhibits no distension.  Musculoskeletal: He exhibits no edema.  Neurological: He is alert and oriented to person, place, and time.  Skin: Skin is warm. He is diaphoretic.  Psychiatric: He has a normal mood and affect.  Nursing note and vitals reviewed.   ED Course  Procedures (including critical care time) Labs Review Labs Reviewed  CBC - Abnormal; Notable for the following:    RBC 3.08 (*)    Hemoglobin 8.6 (*)    HCT 26.8 (*)    All other components within normal limits  BASIC METABOLIC PANEL - Abnormal; Notable for the following:    Glucose, Bld 120 (*)    BUN 31 (*)    Creatinine, Ser 2.41 (*)    GFR calc non Af Amer 28 (*)    GFR calc Af Amer 33 (*)    All other components within normal limits  BRAIN NATRIURETIC PEPTIDE  - Abnormal; Notable for the following:    B Natriuretic Peptide 797.6 (*)    All other components within normal limits  Randolm Idol, ED    Imaging Review Dg Chest Port 1 View  12/05/2014   CLINICAL DATA:  Respiratory distress  EXAM: PORTABLE CHEST - 1 VIEW  COMPARISON:  11/14/2014  FINDINGS: Cardiac enlargement. Diffuse bilateral airspace disease most likely pulmonary edema. This was not present previously. No significant effusion.  IMPRESSION: Diffuse bilateral airspace disease compatible with edema versus diffuse pneumonia.   Electronically Signed   By: Franchot Gallo M.D.   On: 12/05/2014 19:33     EKG Interpretation   Date/Time:  Friday December 05 2014 17:25:25 EDT Ventricular Rate:  93 PR Interval:  154 QRS Duration: 98 QT Interval:  338 QTC Calculation: 420 R Axis:   -8 Text Interpretation:  Normal sinus rhythm with sinus arrhythmia Left  ventricular hypertrophy with repolarization abnormality Abnormal ECG Sinus  rhythm Left ventricular hypertrophy Artifact T wave abnormality Abnormal  ekg Confirmed by Carmin Muskrat  MD ([id number]) on 12/05/2014 6:34:06 PM     Pulse oximetry 96% with nasal cannula, 80% room air abnormal Cardiac 95 sinus normal  Patient's blood pressure is greater than 220/110 initially. With respiratory distress, hypertension, patient will receive BiPAP, nitroglycerin patch.  8:36 PM Patient now has substantially better breath sounds. He remains HTN, now receiving labetalol x3, after initial hydralazine.  Update: Patient is getting hypoxic, with coarse breath sounds, persistent hypertension. Patient will restart BiPAP.  Update: With persistent hypertension, patient has received 3 doses of labetalol, 20 mg per  Patient on antihypertensive, starting nitroglycerin drip.    MDM   Final diagnoses:  Respiratory distress   hypertensive emergency   Patient presents with dyspnea, fatigue. Here the patient is in respiratory distress initially, with  notable hypertension as well. Patient's physical exam concerning for pulmonary vascular congestion, and he has some improvement transiently with accommodations of hydralazine, nitroglycerin paste, labetalol, BiPAP, albuterol, but remained persistently dyspneic, hypertensive. Patient required initiation of nitroglycerin drip in an attempt to control his blood pressure. This was  well tolerated, though the patient remained dyspneic. Blood pressure did decrease below [id number] systolic eventually, and the patient was admitted to the critical care unit for further evaluation and management.  CRITICAL CARE Performed by: Carmin Muskrat Total critical care time: 45 Critical care time was exclusive of separately billable procedures and treating other patients. Critical care was necessary to treat or prevent imminent or life-threatening deterioration. Critical care was time spent personally by me on the following activities: development of treatment plan with patient and/or surrogate as well as nursing, discussions with consultants, evaluation of patient's response to treatment, examination of patient, obtaining history from patient or surrogate, ordering and performing treatments and interventions, ordering and review of laboratory studies, ordering and review of radiographic studies, pulse oximetry and re-evaluation of patient's condition.     Carmin Muskrat, MD 12/06/14 [phone]

## 2014-12-05 NOTE — Assessment & Plan Note (Signed)
Assessment: Pt with nephrotic syndrome due to DM and HTN with 3.85 g of proteinuria on 11/18/14 with last Cr of 3.06 on 11/26/14 up from baseline 2-2.5 who presents with normal urine output.   Plan:  -Pt refused obtaining lab work  -Due to volume overload in setting of acute on chronic CHF and nephrotic syndrome pt to start lasix therapy -Pt to follow-up with nephrologist Dr. Fran Lowes ([facility]) in 2 weeks

## 2014-12-05 NOTE — ED Notes (Signed)
Pt. Reports PCP took off of lasix x3 days ago and since has had increased SOB with productive cough. Pt. Also reports did not take BP medication this AM.

## 2014-12-05 NOTE — ED Notes (Signed)
Pt. Placed on 4L O2, oxygen sat 90%.

## 2014-12-05 NOTE — Assessment & Plan Note (Signed)
Assessment: Pt with uncontrolled hypertension compliant with four-class (BB, nitrate, CCB, and alpha-2 agonist) anti-hypertensive therapy who presents with blood pressure of 173/87.   Plan:  -BP 173/87 not at goal <140/90 -Pt instructed to start lasix 80 mg daily until improvement of volume overload and then 60 mg daily with further adjustment by his cardiologist on Monday 12/08/14 -Continue diltiazem 180 mg daily, hydralazine 100 mg TID, labetalol 300 mg BID, and clonidine 0.3 mg BID -Hold losartan 100 mg daily until renal function can be assessed

## 2014-12-05 NOTE — Progress Notes (Signed)
Patient ID: Charles Hart, male   DOB: 01-10-1956, 59 y.o.   MRN: [id number] 59 yo with DM,HTN history of abnormal ECG and DCM.  Cath in 2004 and 2008 with no CAD consistent with NIDCM.  Previous EF 30-35% with poorly controlled BP. Improved to normal on echo 2010 with compliance of meds. No dyspnea,sscp,palpitatins,edema or syncope. Primary has stopped his actos and glipizide and started Byetta. Compliant with meds and BP has been good. ECG chronically abnormal with LVH changes. Saw Dr Katy Fitch in January and eyes are fine. No other end organ damage from DM. BS out of control in January due to med compliance issues. 2 grand daughters were living with him for a while and there was a lot of stress. 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  ------------------------------------------------------------ History: PMH: Coronary artery disease. Cardiomyopathy of unknown etiology. Stroke. Risk factors: Hypertension. Diabetes mellitus.  ------------------------------------------------------------ Study Conclusions  - Left ventricle: The cavity size was normal. Wall thickness was increased in a pattern of moderate LVH. Systolic function was normal. The estimated ejection fraction was in the range of 55% to 60%. Wall motion was normal; there were no regional wall motion abnormalities. Features are consistent with a pseudonormal left ventricular filling pattern, with concomitant abnormal relaxation and increased filling pressure (grade 2 diastolic dysfunction). - Aortic valve: There was no stenosis. - Mitral valve: Mildly calcified annulus. Trivial regurgitation. - Left atrium: The atrium was mildly dilated. - Right ventricle: The cavity size was normal. Systolic function was normal. - Right atrium: The atrium was mildly  dilated. - Pulmonary arteries: No complete TR doppler jet so unable to estimate PA systolic pressure. - Systemic veins: IVC measured 2.1 cm with normal respirophasic variation, suggesting RA pressure 6-10 mmHg.   Kidney function a lot worse over the last 2 years with Cr now 3.1 with nephrotic syndrome and diabetic nephropathy by biopsy Being followed by Dr Lorrene Reid    ROS: Denies fever, malais, weight loss, blurry vision, decreased visual acuity, cough, sputum, SOB, hemoptysis, pleuritic pain, palpitaitons, heartburn, abdominal pain, melena, lower extremity edema, claudication, or rash.  All other systems reviewed and negative  General: Affect appropriate Overweight black male with poor dentition  HEENT: normal Neck supple with no adenopathy JVP normal no bruits no thyromegaly Lungs clear with no wheezing and good diaphragmatic motion Heart:  S1/S2 no murmur, no rub, gallop or click PMI normal Abdomen: benighn, BS positve, no tenderness, no AAA no bruit.  No HSM or HJR Distal pulses intact with no bruits No edema Neuro non-focal Skin warm and dry No muscular weakness   Current Outpatient Prescriptions  Medication Sig Dispense Refill  . ACCU-CHEK FASTCLIX LANCETS MISC Use to check blood sugar as directed 3 times a day. Dx code E11.65, on insulin. 102 each 2  . ACCU-CHEK SMARTVIEW test strip CHECK BLOOD SUGAR LEVELS UP TO THREE TIMES DAILY AS DIRECTED 100 each 5  . acetaminophen (TYLENOL) 500 MG tablet Take 500 mg by mouth every 6 (six) hours as needed.    . calcium-vitamin D (OSCAL WITH D) 500-200 MG-UNIT per tablet Take 1 tablet by mouth 2 (two) times daily. 60 tablet 0  . cloNIDine (CATAPRES) 0.3 MG tablet Take 1 tablet (0.3 mg total) by mouth 2 (two) times daily. 60 tablet 11  . clopidogrel (PLAVIX) 75 MG tablet Take 1 tablet (75 mg total) by mouth daily. 90 tablet 3  .  diltiazem (CARDIZEM CD) 180 MG 24 hr capsule Take 1 capsule (180 mg total) by mouth daily. 30 capsule 3  .  fluticasone (FLONASE) 50 MCG/ACT nasal spray Place 2 sprays into both nostrils daily as needed for allergies or rhinitis.    . furosemide (LASIX) 40 MG tablet Take 1.5 tablets (60 mg total) by mouth daily. 45 tablet 0  . gabapentin (NEURONTIN) 300 MG capsule Take 1 capsule (300 mg total) by mouth every morning. (Patient taking differently: Take 300 mg by mouth at bedtime. ) 90 capsule 1  . hydrALAZINE (APRESOLINE) 50 MG tablet Take 100 mg by mouth 3 (three) times daily.    . insulin aspart (NOVOLOG FLEXPEN) 100 UNIT/ML FlexPen Inject 7 Units into the skin 3 (three) times daily with meals. Or as directed using sliding scale instructions. 15 mL 1  . Insulin Glargine (LANTUS) 100 UNIT/ML Solostar Pen Inject 30 Units into the skin at bedtime. 15 mL 1  . Insulin Pen Needle 31G X 5 MM MISC 1 Units by Does not apply route 4 (four) times daily as needed. 120 each 3  . labetalol (NORMODYNE) 300 MG tablet Take 300 mg by mouth 2 (two) times daily.    . Multiple Vitamin (MULITIVITAMIN WITH MINERALS) TABS Take 1 tablet by mouth every morning.     . pravastatin (PRAVACHOL) 40 MG tablet Take 1 tablet (40 mg total) by mouth daily. 90 tablet 3   No current facility-administered medications for this visit.    Allergies  Amlodipine and Lisinopril  Electrocardiogram:  07/10/13 SR LVH with strain   Assessment and Plan

## 2014-12-06 ENCOUNTER — Encounter (HOSPITAL_COMMUNITY): Payer: Self-pay | Admitting: *Deleted

## 2014-12-06 DIAGNOSIS — J810 Acute pulmonary edema: Secondary | ICD-10-CM

## 2014-12-06 DIAGNOSIS — I10 Essential (primary) hypertension: Secondary | ICD-10-CM

## 2014-12-06 DIAGNOSIS — R0603 Acute respiratory distress: Secondary | ICD-10-CM | POA: Insufficient documentation

## 2014-12-06 DIAGNOSIS — R0600 Dyspnea, unspecified: Secondary | ICD-10-CM

## 2014-12-06 LAB — CBC
HCT: 22 % — ABNORMAL LOW (ref 39.0–52.0)
HEMOGLOBIN: 7 g/dL — AB (ref 13.0–17.0)
MCH: 27.6 pg (ref 26.0–34.0)
MCHC: 31.8 g/dL (ref 30.0–36.0)
MCV: 86.6 fL (ref 78.0–100.0)
Platelets: 262 10*3/uL (ref 150–400)
RBC: 2.54 MIL/uL — ABNORMAL LOW (ref 4.22–5.81)
RDW: 14.9 % (ref 11.5–15.5)
WBC: 9.1 10*3/uL (ref 4.0–10.5)

## 2014-12-06 LAB — GLUCOSE, CAPILLARY
GLUCOSE-CAPILLARY: 158 mg/dL — AB (ref 70–99)
Glucose-Capillary: 131 mg/dL — ABNORMAL HIGH (ref 70–99)
Glucose-Capillary: 104 mg/dL — ABNORMAL HIGH (ref 70–99)
Glucose-Capillary: 171 mg/dL — ABNORMAL HIGH (ref 70–99)

## 2014-12-06 LAB — CREATININE, SERUM
Creatinine, Ser: 2.45 mg/dL — ABNORMAL HIGH (ref 0.50–1.35)
GFR calc non Af Amer: 28 mL/min — ABNORMAL LOW (ref >90)
GFR, EST AFRICAN AMERICAN: 32 mL/min — AB (ref >90)

## 2014-12-06 LAB — BASIC METABOLIC PANEL
ANION GAP: 9 (ref 5–15)
BUN: 33 mg/dL — ABNORMAL HIGH (ref 6–23)
CO2: 22 mmol/L (ref 19–32)
Calcium: 8.3 mg/dL — ABNORMAL LOW (ref 8.4–10.5)
Chloride: 107 mmol/L (ref 96–112)
Creatinine, Ser: 2.42 mg/dL — ABNORMAL HIGH (ref 0.50–1.35)
GFR calc Af Amer: 33 mL/min — ABNORMAL LOW (ref >90)
GFR calc non Af Amer: 28 mL/min — ABNORMAL LOW (ref >90)
GLUCOSE: 161 mg/dL — AB (ref 70–99)
Potassium: 4.6 mmol/L (ref 3.5–5.1)
Sodium: 138 mmol/L (ref 135–145)

## 2014-12-06 LAB — TROPONIN I
Troponin I: 0.06 ng/mL — ABNORMAL HIGH (ref <0.031)
Troponin I: 0.06 ng/mL — ABNORMAL HIGH (ref <0.031)

## 2014-12-06 LAB — MRSA PCR SCREENING: MRSA BY PCR: NEGATIVE

## 2014-12-06 MED ORDER — ASPIRIN 81 MG PO CHEW: 324.0000 mg | CHEWABLE_TABLET | ORAL | Status: DC

## 2014-12-06 MED ORDER — SODIUM CHLORIDE 0.9 % IV SOLN: 250.0000 mL | INTRAVENOUS | Status: DC | PRN

## 2014-12-06 MED ORDER — MORPHINE SULFATE 2 MG/ML IJ SOLN: 2.0000 mg | INTRAMUSCULAR | Status: DC | PRN

## 2014-12-06 MED ORDER — CETYLPYRIDINIUM CHLORIDE 0.05 % MT LIQD
7.0000 mL | Freq: Two times a day (BID) | OROMUCOSAL | Status: DC
  Administered 2014-12-06 – 2014-12-08 (×4): 7 mL via OROMUCOSAL

## 2014-12-06 MED ORDER — NITROGLYCERIN IN D5W 200-5 MCG/ML-% IV SOLN
0.0000 ug/min | INTRAVENOUS | Status: DC
  Administered 2014-12-07: 10 ug/min via INTRAVENOUS
  Filled 2014-12-06: qty 250

## 2014-12-06 MED ORDER — INSULIN ASPART 100 UNIT/ML ~~LOC~~ SOLN
0.0000 [IU] | Freq: Three times a day (TID) | SUBCUTANEOUS | Status: DC
  Administered 2014-12-06: 3 [IU] via SUBCUTANEOUS
  Administered 2014-12-06: 2 [IU] via SUBCUTANEOUS
  Administered 2014-12-07: 3 [IU] via SUBCUTANEOUS
  Administered 2014-12-07 – 2014-12-08 (×2): 2 [IU] via SUBCUTANEOUS

## 2014-12-06 MED ORDER — ASPIRIN 300 MG RE SUPP: 300.0000 mg | RECTAL | Status: DC

## 2014-12-06 MED ORDER — FUROSEMIDE 40 MG PO TABS
40.0000 mg | ORAL_TABLET | Freq: Every day | ORAL | Status: DC
  Administered 2014-12-07: 40 mg via ORAL
  Filled 2014-12-06: qty 1

## 2014-12-06 MED ORDER — HEPARIN SODIUM (PORCINE) 5000 UNIT/ML IJ SOLN
5000.0000 [IU] | Freq: Three times a day (TID) | INTRAMUSCULAR | Status: DC
  Administered 2014-12-06 (×2): 5000 [IU] via SUBCUTANEOUS
  Filled 2014-12-06 (×10): qty 1

## 2014-12-06 MED ORDER — LABETALOL HCL 100 MG PO TABS
100.0000 mg | ORAL_TABLET | Freq: Two times a day (BID) | ORAL | Status: DC
  Administered 2014-12-06 – 2014-12-08 (×5): 100 mg via ORAL
  Filled 2014-12-06 (×6): qty 1

## 2014-12-06 MED ORDER — METOPROLOL TARTRATE 50 MG PO TABS
50.0000 mg | ORAL_TABLET | Freq: Two times a day (BID) | ORAL | Status: DC
  Administered 2014-12-06 (×2): 50 mg via ORAL
  Filled 2014-12-06 (×2): qty 1
  Filled 2014-12-06: qty 2
  Filled 2014-12-06: qty 1

## 2014-12-06 MED ORDER — FUROSEMIDE 10 MG/ML IJ SOLN
80.0000 mg | Freq: Two times a day (BID) | INTRAMUSCULAR | Status: DC
  Administered 2014-12-06 (×2): 80 mg via INTRAVENOUS
  Filled 2014-12-06 (×2): qty 8

## 2014-12-06 MED ORDER — HYDRALAZINE HCL 20 MG/ML IJ SOLN
10.0000 mg | INTRAMUSCULAR | Status: DC | PRN
  Administered 2014-12-06 – 2014-12-07 (×6): 10 mg via INTRAVENOUS
  Filled 2014-12-06 (×7): qty 1

## 2014-12-06 MED ORDER — ONDANSETRON HCL 4 MG/2ML IJ SOLN: 4.0000 mg | Freq: Four times a day (QID) | INTRAMUSCULAR | Status: DC | PRN

## 2014-12-06 NOTE — Progress Notes (Signed)
PULMONARY  / CRITICAL CARE MEDICINE  Name: Charles Hart MRN: [name] DOB: Apr 04, 1956    LOS: 74  REFERRING MD :  ED  CHIEF COMPLAINT:  dyspnea  HISTORY OF PRESENT ILLNESS:  Charles Hart is a 59 year old male with grade 2 diastolic dysfunction CHF, type 2 diabetes complicated by neuropathy, CKD Stage 3 with nephrotic syndrome, h/o CVA, chronic normocytic anemia, OSA who presented to the ED with worsening dyspnea and leg edema. His wife at bedside reports worsening leg edema overnight with development of shortness of breath. He presented to his office visit earlier today and was instructed to take Lasix x 2 tablets but felt his symptoms worsen upon going home, so he presented to the ED. He denies any recent illness, non-adherence to medication, chest pain, worsening orthopnea, cough, fever. Per chart review, he was hospitalized 3/31-4/6 for AKI with weight 215 lbs at discharge that increased to 232 lbs over several office visits this month with noted non-adherence to medication and refusal to have bloodwork done. In the ED, systolic BP was in the 123456, and he received Lasix 80mg  IV, labetalol 10mg , hydralazine 10mg . He was started on BiPAP & [name] asked to admit.    SUBJECTIVE:  Pt reports feeling much better.  Denies chest pain, SOB.  He is concerned b/c he recently had a med change and feels that is why he "landed in the hospital"  VITAL SIGNS: Temp:  [97.8 F (36.6 C)-98.4 F (36.9 C)] 98.4 F (36.9 C) (04/23 0754) Pulse Rate:  [71-109] 82 (04/23 1200) Resp:  [14-40] 25 (04/23 1200) BP: (147-227)/(83-117) 156/86 mmHg (04/23 1200) SpO2:  [80 %-100 %] 97 % (04/23 1200) FiO2 (%):  [40 %] 40 % (04/23 0754) Weight:  [219 lb 2.2 oz (99.4 kg)-232 lb 8 oz (105.461 kg)] 219 lb 2.2 oz (99.4 kg) (04/23 0104)   VENTILATOR SETTINGS: Vent Mode:  [-] BIPAP FiO2 (%):  [40 %] 40 % Set Rate:  [10 bmp] 10 bmp   INTAKE / OUTPUT: Intake/Output      04/22 0701 - 04/23 0700 04/23 0701 - 04/24 0700    P.O. 360    I.V. (mL/kg) 69.2 (0.7)    Total Intake(mL/kg) 429.2 (4.3)    Urine (mL/kg/hr) 2800    Total Output 2800     Net -2370.9            PHYSICAL EXAMINATION: General: adult male in NAD Neuro: AAOx4, speech clear, MAE HEENT: PERRL, EOMI Cardiac: RRR, no rubs, murmurs or gallops Pulm: clear to auscultation bilaterally in the anterior lung fields, no wheezes, rales, or rhonchi Abd: soft, nontender, nondistended, BS present Ext: warm and well perfused, 2+ pitting edema BLE    LABS: Cbc  Recent Labs Lab 12/05/14 1838 12/06/14 0351  WBC 9.6 9.1  HGB 8.6* 7.0*  HCT 26.8* 22.0*  PLT 296 262   Chemistry  Recent Labs Lab 12/05/14 1838 12/06/14 0351  NA 139  --   K 4.5  --   CL 108  --   CO2 22  --   BUN 31*  --   CREATININE 2.41* 2.45*  CALCIUM 8.7  --   GLUCOSE 120*  --    CBG trend  Recent Labs Lab 12/03/14 0831 12/06/14 0752  GLUCAP 158* 131*    IMAGING:  ECG:  DIAGNOSES: Active Problems:   Hypertensive emergency   ASSESSMENT / PLAN:  PULMONARY A: Acute respiratory failure: likely 2/2 hypertensive emergency; acute CHF exacerbation less likely given reassuring BNP;  EKG findings reassuring against ACS; AKI unlikely given baseline Crt OSA P:   Wean O2 as needed for saturations > 90% PRN CXR  Pulmonary hygiene   CARDIOVASCULAR A:  CHF - grade 2 diastolic dysfunction [echo 03/20/13] Hypertensive emergency P:  ASA Wean NTG gtt to off  Continue home clonidine 0.3mg  BID Labetalol 200 mg BID (reduced from home dose) Lasix 40 mg QD (60 mg QD home dose), start in am 4/24 after sr cr reviewed  Recheck echo Follow troponins Monitor with telemetry Holding diltiazem, pravastatin  RENAL A:   CKD Stage 3 with nephrotic syndrome - 2 2/2 DM2 as noted on renal biopsy 05/30/2013, baseline Crt 2.0-2.[name] P:   Lasix as above Trend BMP  Replace electrolytes as indicated   GASTROINTESTINAL A:   No acute issues. P:   Diet as tolerated    HEMATOLOGIC A:   Chronic normocytic anemia - likely 2/2 CKD, baseline Hb 8-10 P:  Trend CBC DVT proph: Heparin sq   ENDOCRINE A:   DM2: A1c 7.7, March 2016 P:   SSI  NEUROLOGIC A:   H/o CVA - no acute neurologic deficits Diabetic neuropathy P:   Continue following Holding gabapentin, Plavix  MSK A: Back pain P: Morphine 2mg  q2h prn   Wean NTG to off.  Tx primary SVC to IM TS as of 4/24 0700.  [name] will sign off.    Charles Gens, NP-C [facility] Pgr: [phone] or [phone] 12/06/2014, 12:21 PM

## 2014-12-06 NOTE — Progress Notes (Signed)
Received call from [name] today that patient will be transferred back to IMTS herring service in AM on 12/07/14.  Dr. Naaman Plummer

## 2014-12-06 NOTE — Progress Notes (Signed)
Pt. Refused bipap. Pt. Stated he doesn't want to wear bipap and that he feels he is ok.

## 2014-12-06 NOTE — Progress Notes (Signed)
eLink Physician-Brief Progress Note Patient Name: Charles Hart DOB: 1956/01/26 MRN: [name]   Date of Service  12/06/2014  HPI/Events of Note  Hypertension. SBP = 190's. Not able to wean NTG IV infusion off.   eICU Interventions  Will order Hydralazine 10 mg IV Q 1 hour PRN SBP > 160 or DBP > 100.     Intervention Category Intermediate Interventions: Hypertension - evaluation and management  Toussaint Golson Eugene 12/06/2014, 5:52 PM

## 2014-12-06 NOTE — Progress Notes (Signed)
Internal Medicine Clinic Attending  Case discussed with Dr. Rabbani soon after the resident saw the patient.  We reviewed the resident's history and exam and pertinent patient test results.  I agree with the assessment, diagnosis, and plan of care documented in the resident's note.  

## 2014-12-07 ENCOUNTER — Inpatient Hospital Stay (HOSPITAL_COMMUNITY): Payer: PPO

## 2014-12-07 DIAGNOSIS — R0600 Dyspnea, unspecified: Secondary | ICD-10-CM

## 2014-12-07 LAB — RENAL FUNCTION PANEL
ANION GAP: 10 (ref 5–15)
Albumin: 2.5 g/dL — ABNORMAL LOW (ref 3.5–5.2)
BUN: 39 mg/dL — ABNORMAL HIGH (ref 6–23)
CALCIUM: 8.6 mg/dL (ref 8.4–10.5)
CO2: 26 mmol/L (ref 19–32)
CREATININE: 2.43 mg/dL — AB (ref 0.50–1.35)
Chloride: 100 mmol/L (ref 96–112)
GFR calc Af Amer: 32 mL/min — ABNORMAL LOW (ref >90)
GFR calc non Af Amer: 28 mL/min — ABNORMAL LOW (ref >90)
GLUCOSE: 137 mg/dL — AB (ref 70–99)
PHOSPHORUS: 4.5 mg/dL (ref 2.3–4.6)
Potassium: 4 mmol/L (ref 3.5–5.1)
Sodium: 136 mmol/L (ref 135–145)

## 2014-12-07 LAB — LACTIC ACID, PLASMA: Lactic Acid, Venous: 0.8 mmol/L (ref 0.5–2.0)

## 2014-12-07 LAB — MAGNESIUM: Magnesium: 1.6 mg/dL (ref 1.5–2.5)

## 2014-12-07 LAB — CBC
HCT: 23.1 % — ABNORMAL LOW (ref 39.0–52.0)
Hemoglobin: 7.4 g/dL — ABNORMAL LOW (ref 13.0–17.0)
MCH: 27.2 pg (ref 26.0–34.0)
MCHC: 32 g/dL (ref 30.0–36.0)
MCV: 84.9 fL (ref 78.0–100.0)
Platelets: 239 10*3/uL (ref 150–400)
RBC: 2.72 MIL/uL — ABNORMAL LOW (ref 4.22–5.81)
RDW: 14.8 % (ref 11.5–15.5)
WBC: 6.8 10*3/uL (ref 4.0–10.5)

## 2014-12-07 LAB — ABO/RH: ABO/RH(D): A POS

## 2014-12-07 LAB — TYPE AND SCREEN
ABO/RH(D): A POS
Antibody Screen: NEGATIVE

## 2014-12-07 LAB — GLUCOSE, CAPILLARY
GLUCOSE-CAPILLARY: 139 mg/dL — AB (ref 70–99)
Glucose-Capillary: 170 mg/dL — ABNORMAL HIGH (ref 70–99)
Glucose-Capillary: 89 mg/dL (ref 70–99)
Glucose-Capillary: 141 mg/dL — ABNORMAL HIGH (ref 70–99)

## 2014-12-07 LAB — BRAIN NATRIURETIC PEPTIDE: B NATRIURETIC PEPTIDE 5: 777.8 pg/mL — AB (ref 0.0–100.0)

## 2014-12-07 LAB — TROPONIN I: Troponin I: 0.06 ng/mL — ABNORMAL HIGH (ref <0.031)

## 2014-12-07 MED ORDER — CALCIUM CARBONATE-VITAMIN D 500-200 MG-UNIT PO TABS
1.0000 | ORAL_TABLET | Freq: Two times a day (BID) | ORAL | Status: DC
  Administered 2014-12-07 – 2014-12-08 (×2): 1 via ORAL
  Filled 2014-12-07 (×3): qty 1

## 2014-12-07 MED ORDER — HYDRALAZINE HCL 50 MG PO TABS
100.0000 mg | ORAL_TABLET | Freq: Three times a day (TID) | ORAL | Status: DC
  Administered 2014-12-07 – 2014-12-08 (×4): 100 mg via ORAL
  Filled 2014-12-07 (×8): qty 2

## 2014-12-07 MED ORDER — FUROSEMIDE 40 MG PO TABS
40.0000 mg | ORAL_TABLET | Freq: Two times a day (BID) | ORAL | Status: DC
  Administered 2014-12-07 – 2014-12-08 (×2): 40 mg via ORAL
  Filled 2014-12-07 (×4): qty 1

## 2014-12-07 MED ORDER — ONDANSETRON HCL 4 MG PO TABS: 4.0000 mg | ORAL_TABLET | Freq: Three times a day (TID) | ORAL | Status: DC | PRN

## 2014-12-07 NOTE — Progress Notes (Signed)
Charles Hart [id number] Admission Data: 12/07/2014 3:51 PM Attending Provider: Oval Linsey, MD  [id number], Bethann Berkshire, MD Consults/ Treatment Team:    Charles Hart is a 59 y.o. male patient admitted from ED awake, alert  & orientated  X 2,  Full Code, VSS - Blood pressure 146/83, pulse 67, temperature 98.1 F (36.7 C), temperature source Oral, resp. rate 16, height 6' (1.829 m), weight 95.5 kg (210 lb 8.6 oz), SpO2 100 %.,no c/o chest pain, no distress noted. Tele  2 placed and pt is currently running:normal sinus rhythm.   IV site WDL:  wrist left, condition patent and no redness with a transparent dsg that's clean dry and intact.  Allergies:   Allergies  Allergen Reactions  . Amlodipine Swelling  . Lisinopril Cough     Past Medical History  Diagnosis Date  . Dermatitis   . CHF (congestive heart failure)     LV function improved from 2004 to 2008.  Historically, moderately dilated LV with EF 30-40% by 2D echo 08/14/2002.  Mild CAD with severe LV dysfunction by cardiac cath 09/2002.  Normal coronary arteries and normal LV function by cardiac cath 09/19/2006.  A 2-D echo on 04/01/2009 showed mild concentric hypertrophy and normal systolic (LVEF  123456) and doppler C/W with grade 1 diastolic dysfunction..   . Hypertension   . Hyperlipidemia   . Hearing loss in right ear   . Cardiomyopathy     LV function improved from 2004 to 2008.  Historically, moderately dilated LV with EF 30-40% by 2D echo 08/14/2002.  Mild CAD with severe LV dysfunction by cardiac cath 09/2002.  Normal coronary arteries and normal LV function by cardiac cath 09/19/2006.  A 2-D echo on 04/01/2009 showed mild concentric hypertrophy and normal systolic (LVEF  123456) and doppler C/W with grade 1 diastolic dysfunction.  . DM neuropathy, painful   . CVA (cerebral vascular accident) 07/04/2012    MRI of the brain 07/04/2012 showed an acute infarct in the right basal ganglia involving the anterior putamen, anterior limb  internal capsule, and head of the caudate; this measured approximately 2.5 cm in diameter.     . Hypertensive crisis 07/28/2012  . Myocardial infarction   . Diabetes mellitus     type 2  . Hypertensive urgency 08/20/2014  . Nephrotic syndrome 02/18/2013    A 24-hour urine collection 03/04/2013 showed total protein of 5,460 g and creatinine clearance of 80 mL/minute.  Patient was seen by Seward Meth at [facility] and a repeat 24-hour urine showed 10,407 mg protein.  Patient underwent kidney biopsy on 05/30/2013; pathology showed advanced diffuse and nodular diabetic nephropathy with vascular changes consistent with long-standing difficult to control hypertension.     . Adenomatous colon polyp 07/02/2011    Last colonoscopy May 06, 2011 by Dr. Owens Loffler, who recommended repeat colonoscopy in 5 years.   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 Background diabetic retinopathy 04/20/2012    Patient is followed by Dr. Katy Fitch     History:  obtained from the patient. Tobacco/alcohol: denied none  Pt orientation to unit, room and routine. Information packet given to patient/family and safety video watched.  Admission INP armband ID verified with patient/family, and in place. SR up x 2, fall risk assessment complete with Patient and family verbalizing understanding of risks associated with falls. Pt verbalizes an understanding of how to use the call bell and  to call for help before getting out of bed.  Skin, clean-dry- intact without evidence of bruising, or skin tears.   No evidence of skin break down noted on exam. no rashes, no nodules, no jaundice, no purpura. Does have a wound on her back with foam.    Will cont to monitor and assist as needed.  Charles Irving Margaretha Sheffield, RN 12/07/2014 3:51 PM

## 2014-12-07 NOTE — Progress Notes (Signed)
Pharmacist Heart Failure Core Measure Documentation  Assessment: Charles Hart has an EF documented as 25-30% on 4/24 by Echo.  Rationale: Heart failure patients with left ventricular systolic dysfunction (LVSD) and an EF < 40% should be prescribed an angiotensin converting enzyme inhibitor (ACEI) or angiotensin receptor blocker (ARB) at discharge unless a contraindication is documented in the medical record.  This patient is not currently on an ACEI or ARB for HF.  This note is being placed in the record in order to provide documentation that a contraindication to the use of these agents is present for this encounter.  ACE Inhibitor or Angiotensin Receptor Blocker is contraindicated (specify all that apply)  []   ACEI allergy AND ARB allergy []   Angioedema []   Moderate or severe aortic stenosis []   Hyperkalemia []   Hypotension []   Renal artery stenosis [x]   Worsening renal function, preexisting renal disease or dysfunction   Charles Hart 12/07/2014 4:14 PM

## 2014-12-07 NOTE — Progress Notes (Signed)
Attempted report to 5w.  That nurse was discharging a patient, I was asked to leave my name and number and she would call me back when she was done.    Carol Ada, RN

## 2014-12-07 NOTE — Progress Notes (Signed)
Patient ambulated around the unit twice tonight. The first time his HR increased to 130s but recovered quickly. The second time he ambulated he tolerated it well with no changes in HR. Patient eager to be more mobile while in the hospital.

## 2014-12-07 NOTE — Progress Notes (Signed)
Patient refused 2 doses of Heparin stating "I don't want the heparin shot because it hurts". I educated him on why heparin was ordered for him and the risks of not taking it.

## 2014-12-07 NOTE — Progress Notes (Signed)
Subjective: Mr Duyck feels very well this morning. He was sitting up on side of bed eating breakfast. He had no complaints and specifically denied chest pain, SOB, palpitations. He said he got up to walk around the unit and the second time he had no weakness. He requested we transfer him to floor.  Objective: Vital signs in last 24 hours: Filed Vitals:   12/07/14 0659 12/07/14 0700 12/07/14 0742 12/07/14 0907  BP: 169/80 175/67 158/72 169/71  Pulse:  80 83   Temp:   98.7 F (37.1 C)   TempSrc:   Oral   Resp:  25 18   Height:      Weight:      SpO2:  100% 99%    Weight change: -15 lb 7.4 oz (-7.013 kg)  Intake/Output Summary (Last 24 hours) at 12/07/14 1051 Last data filed at 12/07/14 0800  Gross per 24 hour  Intake 343.35 ml  Output   5050 ml  Net -4706.65 ml   Gen: A&O x 4, No acute distress, well developed, well nourished HEENT: Atraumatic, PERRL, EOMI, sclerae anicteric, moist mucous membranes Heart: Regular rate and rhythm, normal S1 S2, no murmurs, rubs, or gallops Lungs: Clear to auscultation bilaterally, respirations unlabored Abd: Soft, non-tender, non-distended, + bowel sounds, no hepatosplenomegaly Ext: 1+ b/l pitting edema to shins  Lab Results: Basic Metabolic Panel:  Recent Labs Lab 12/06/14 0351 12/07/14 0404  NA 138 136  K 4.6 4.0  CL 107 100  CO2 22 26  GLUCOSE 161* 137*  BUN 33* 39*  CREATININE 2.42*  2.45* 2.43*  CALCIUM 8.3* 8.6  MG  --  1.6  PHOS  --  4.5   Liver Function Tests:  Recent Labs Lab 12/07/14 0404  ALBUMIN 2.5*   CBC:  Recent Labs Lab 12/06/14 0351 12/07/14 0404  WBC 9.1 6.8  HGB 7.0* 7.4*  HCT 22.0* 23.1*  MCV 86.6 84.9  PLT 262 239   Cardiac Enzymes:  Recent Labs Lab 12/06/14 0351 12/06/14 1026 12/07/14 0404  TROPONINI 0.06* 0.06* 0.06*   CBG:  Recent Labs Lab 12/03/14 0831 12/06/14 0752 12/06/14 1203 12/06/14 1619 12/06/14 2135 12/07/14 0745  GLUCAP 158* 131* 158* 104* 171* 139*      Micro Results: Recent Results (from the past 240 hour(s))  MRSA PCR Screening     Status: None   Collection Time: 12/06/14  1:03 AM  Result Value Ref Range Status   MRSA by PCR NEGATIVE NEGATIVE Final    Comment:        The GeneXpert MRSA Assay (FDA approved for NASAL specimens only), is one component of a comprehensive MRSA colonization surveillance program. It is not intended to diagnose MRSA infection nor to guide or monitor treatment for MRSA infections.    Studies/Results: Dg Chest Port 1 View  12/07/2014   CLINICAL DATA:  Acute pulmonary edema  EXAM: PORTABLE CHEST - 1 VIEW  COMPARISON:  12/05/2014  FINDINGS: Improving aeration bilaterally is noted with near complete resolution of previously seen fluffy perihilar airspace opacities. No significant pleural effusion. Mild enlargement of the cardiac silhouette reidentified.  IMPRESSION: Improving pulmonary aeration with near complete resolution of previously seen perihilar airspace opacities.   Electronically Signed   By: Conchita Paris M.D.   On: 12/07/2014 09:22   Dg Chest Port 1 View  12/05/2014   CLINICAL DATA:  Respiratory distress  EXAM: PORTABLE CHEST - 1 VIEW  COMPARISON:  11/14/2014  FINDINGS: Cardiac enlargement. Diffuse bilateral airspace disease most likely  pulmonary edema. This was not present previously. No significant effusion.  IMPRESSION: Diffuse bilateral airspace disease compatible with edema versus diffuse pneumonia.   Electronically Signed   By: Franchot Gallo M.D.   On: 12/05/2014 19:33   Medications: I have reviewed the patient's current medications. Scheduled Meds: . antiseptic oral rinse  7 mL Mouth Rinse BID  . cloNIDine  0.3 mg Oral BID  . furosemide  40 mg Oral BID  . heparin  5,000 Units Subcutaneous 3 times per day  . hydrALAZINE  100 mg Oral 3 times per day  . insulin aspart  0-15 Units Subcutaneous TID WC  . labetalol  100 mg Oral BID   Continuous Infusions:  PRN Meds:.sodium chloride,  ondansetron Assessment/Plan: Active Problems:   Hypertensive emergency   Acute pulmonary edema   Respiratory distress  #Pulmonary Edema: Mr Ashley presented with likely flash pulmonary edema secondary to his poorly controlled HTN. Last echo 03/2013 EF 0000000, grade 2 diastolic dysfunction. While under [name] care, he received a total lasix 200 iv. Net - 7L and down 16 lbs. Repeat CXR this morning shows nearly complete resolution of previous perihilar opacities (portable so also not as good as 2 view). On exam his lungs were clear. He is asymptomatic. Legs with some mild 1+ pitting edema to shins. Creatinine 2.43 -echo -lasix 40 mg po bid -TED hose -daily weight, I&Os -transfer to med-surg  #HTN: Mr Dudoit presented with uncontrolled HTN. He was placed on nitro drip per [name] which was weaned overnight. At home he is on clonidine 0.3 mg bid, hydralazine 100 mg tid, labetalol 300 mg bid, as well as recently started diltiazam 180 mg daily, lasix 60 mg daily. These last two were started after recent admission earlier this month when his HCTZ and ARB were held in setting of vancomycin/contrast induced AKI which has since resolved. Since coming off nitro drip, BP 150s-170s/60s-80s this morning. -cont clonidine 0.3 mg bid, labetalol 100 mg bid -resume hydralazine 100 mg tid -lasix 40 mg po bid -cont to monitor  #CKD III: Creatinine 2.43 this morning after aggressive diuresis. He was recently hospitalized with AKI thought to be vancomycin and contrast induced. Creatinine went up to 5.72 and was down to 3.72 on discharge. On several follow-up clinic visits, he refused blood draws. On presentation this visit creatinine 2.41. -cont to monitor  #Chronic normocytic anemia: Hemoglobin 7.4 w MCV 85, down from 8.6 on presentation. Likely secondary to his CKD. His baseline hemoglobin appears to be 8-10. -cont to monitor  #DM2: Last A1c 10/2014 was 7.7. At home he is on lantus 30 u qhs, and novolog 7 u tidwc. AM  CBG 139 without lantus. -cont SSI-mod  Dispo: Disposition is deferred at this time, awaiting improvement of current medical problems.  Anticipated discharge in approximately 1 day(s).   The patient does have a current PCP (Carly Montey Hora, MD) and does need an [facility] hospital follow-up appointment after discharge.  The patient does not know have transportation limitations that hinder transportation to clinic appointments.  .Services Needed at time of discharge: Y = Yes, Blank = No PT:   OT:   RN:   Equipment:   Other:     LOS: 2 days   Kelby Aline, MD 12/07/2014, 10:51 AM

## 2014-12-07 NOTE — Progress Notes (Signed)
  Echocardiogram 2D Echocardiogram has been performed.  Charles Hart FRANCES 12/07/2014, 2:39 PM

## 2014-12-08 ENCOUNTER — Encounter: Payer: Medicare Other | Admitting: Cardiovascular Disease

## 2014-12-08 DIAGNOSIS — J80 Acute respiratory distress syndrome: Secondary | ICD-10-CM

## 2014-12-08 LAB — BASIC METABOLIC PANEL
ANION GAP: 10 (ref 5–15)
BUN: 44 mg/dL — AB (ref 6–23)
CO2: 27 mmol/L (ref 19–32)
CREATININE: 2.46 mg/dL — AB (ref 0.50–1.35)
Calcium: 8.8 mg/dL (ref 8.4–10.5)
Chloride: 99 mmol/L (ref 96–112)
GFR calc Af Amer: 32 mL/min — ABNORMAL LOW (ref >90)
GFR, EST NON AFRICAN AMERICAN: 27 mL/min — AB (ref >90)
Glucose, Bld: 148 mg/dL — ABNORMAL HIGH (ref 70–99)
POTASSIUM: 3.8 mmol/L (ref 3.5–5.1)
Sodium: 136 mmol/L (ref 135–145)

## 2014-12-08 LAB — CBC
HCT: 22 % — ABNORMAL LOW (ref 39.0–52.0)
Hemoglobin: 7.1 g/dL — ABNORMAL LOW (ref 13.0–17.0)
MCH: 27.2 pg (ref 26.0–34.0)
MCHC: 32.3 g/dL (ref 30.0–36.0)
MCV: 84.3 fL (ref 78.0–100.0)
Platelets: 265 10*3/uL (ref 150–400)
RBC: 2.61 MIL/uL — AB (ref 4.22–5.81)
RDW: 14.4 % (ref 11.5–15.5)
WBC: 4.4 10*3/uL (ref 4.0–10.5)

## 2014-12-08 LAB — GLUCOSE, CAPILLARY
GLUCOSE-CAPILLARY: 123 mg/dL — AB (ref 70–99)
Glucose-Capillary: 146 mg/dL — ABNORMAL HIGH (ref 70–99)

## 2014-12-08 MED ORDER — LABETALOL HCL 100 MG PO TABS: 100.0000 mg | ORAL_TABLET | Freq: Two times a day (BID) | ORAL | Status: DC

## 2014-12-08 MED ORDER — ACETAMINOPHEN 325 MG PO TABS
650.0000 mg | ORAL_TABLET | Freq: Four times a day (QID) | ORAL | Status: DC | PRN
  Administered 2014-12-08: 650 mg via ORAL
  Filled 2014-12-08: qty 2

## 2014-12-08 MED ORDER — FUROSEMIDE 40 MG PO TABS: 40.0000 mg | ORAL_TABLET | Freq: Two times a day (BID) | ORAL | Status: DC

## 2014-12-08 NOTE — Progress Notes (Signed)
Pharmacist Heart Failure Core Measure Documentation  Assessment: Charles Hart has an EF documented as 25-30% on 12/07/14 by 2D ECHO.  Rationale: Heart failure patients with left ventricular systolic dysfunction (LVSD) and an EF < 40% should be prescribed an angiotensin converting enzyme inhibitor (ACEI) or angiotensin receptor blocker (ARB) at discharge unless a contraindication is documented in the medical record.  This patient is not currently on an ACEI or ARB for HF.  This note is being placed in the record in order to provide documentation that a contraindication to the use of these agents is present for this encounter.  ACE Inhibitor or Angiotensin Receptor Blocker is contraindicated (specify all that apply)  []   ACEI allergy AND ARB allergy []   Angioedema []   Moderate or severe aortic stenosis []   Hyperkalemia []   Hypotension []   Renal artery stenosis [x]   Worsening renal function, preexisting renal disease or dysfunction   Albertina Parr, PharmD., BCPS Clinical Pharmacist Pager [phone]

## 2014-12-08 NOTE — Progress Notes (Addendum)
  PROGRESS NOTE MEDICINE TEACHING ATTENDING   Day 3 of stay Patient name: Charles Hart   Medical record number: [id number] Date of birth: [birth year]   Met and examined patient. No new complaints. Blood pressure 144/76, pulse 69, temperature 98.3 F (36.8 C), temperature source Oral, resp. rate 16, height 6' (1.829 m), weight 206 lb 14.4 oz (93.849 kg), SpO2 100 %. The patient is alert and oriented, comfortable, in no acute distress. PERRL, EOMI. Heart exhibits regular rate and rhythm, no murmurs. Lungs are clear to auscultation. Abdomen is soft and non-tender. There is no pedal edema and good pedal pulses. There are no gross focal neurological deficits apparent.   Assessment/Plan  CHF exacerbation - Stable on lasix 40 BID. Lung exam clear. Repeat CXR shows improved aeration with complete resolution of previously seen perihilar opacities.   Hypertension - Stable. Continue current antihypertensive regimen.   CKD Cr hovering around 2.4   DM2 - CBGs well controlled.   Recent Labs Lab 12/07/14 1240 12/07/14 1733 12/07/14 2105 12/08/14 0806 12/08/14 1208  GLUCAP 170* 89 141* 146* 123*    I have reviewed the chart, lab results, EKG, imaging and relevant notes of this patient. I have discussed this case with Dr Ethelene Hal. Please see his notes for details. Ok to discharge patient with appropriate follow up.   [name] MD MPH 12/08/2014 3:38 PM
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
## 2014-12-08 NOTE — Progress Notes (Signed)
Patient discharge teaching given, including activity, diet, follow-up appoints, and medications. Patient verbalized understanding of all discharge instructions. IV access was d/c'd. Vitals are stable. Skin is intact except as charted in most recent assessments. Pt to be escorted out by NT, to be driven home by family. 

## 2014-12-08 NOTE — Care Management Note (Signed)
    Page 1 of 1   12/08/2014     7:33:34 PM CARE MANAGEMENT NOTE 12/08/2014  Patient:  Charles Hart, Charles Hart   Account Number:  [id number]  Date Initiated:  12/08/2014  Documentation initiated by:  Carles Collet  Subjective/Objective Assessment:   HTN emergency     Action/Plan:   will follow for discharge needs   Anticipated DC Date:  12/08/2014   Anticipated DC Plan:  [place]  CM consult      Choice offered to / List presented to:             Status of service:  Completed, signed off Medicare Important Message given?  YES (If response is "NO", the following Medicare IM given date fields will be blank) Date Medicare IM given:  12/08/2014 Medicare IM given by:  Carles Collet Date Additional Medicare IM given:   Additional Medicare IM given by:    Discharge Disposition:  HOME/SELF CARE  Per UR Regulation:  Reviewed for med. necessity/level of care/duration of stay  If discussed at [place] of Stay Meetings, dates discussed:    Comments:  12-08-14 IM letter given, will continue to follow for DC needs. Carles Collet RN BSN CM

## 2014-12-08 NOTE — Evaluation (Signed)
Physical Therapy Evaluation & Discharge Patient Details Name: Charles Hart MRN: [id number] DOB: 05/31/1956 Today's Date: 12/08/2014   History of Present Illness  Patient is a 59 y/o male admitted with hypretensive emergency and flash pulmonary edema.  PMH positive for HTN, DM, CVA, CKD, CHF, hyperlipidemia.  Clinical Impression  Patient presents at modified independent level with mobility without issues noted during balance testing via Dynamic Gait Index.  He reports walks for exercise in his neighborhood and encouraged gradual return to activity.  Also discussed fall prevention techniques for home.    Follow Up Recommendations No PT follow up    Equipment Recommendations  None recommended by PT    Recommendations for Other Services       Precautions / Restrictions Precautions Precautions: None      Mobility  Bed Mobility               General bed mobility comments: pt up in chair  Transfers Overall transfer level: Modified independent   Transfers: Sit to/from Stand              Ambulation/Gait Ambulation/Gait assistance: Independent Ambulation Distance (Feet): 300 Feet Assistive device: None Gait Pattern/deviations: WFL(Within Functional Limits)   Gait velocity interpretation: >2.62 ft/sec, indicative of independent community ambulator General Gait Details: safe for community ambulation per DGI  Stairs Stairs: Yes Stairs assistance: Modified independent (Device/Increase time) Stair Management: One rail Left;Forwards;Alternating pattern Number of Stairs: 10    Wheelchair Mobility    Modified Rankin (Stroke Patients Only)       Balance                                 Standardized Balance Assessment Standardized Balance Assessment : Dynamic Gait Index   Dynamic Gait Index Level Surface: Normal Change in Gait Speed: Mild Impairment Gait with Horizontal Head Turns: Normal Gait with Vertical Head Turns: Normal Gait and  Pivot Turn: Normal Step Over Obstacle: Normal Step Around Obstacles: Normal Steps: Mild Impairment Total Score: 22       Pertinent Vitals/Pain Pain Assessment: 0-10 Pain Score: 5  Pain Location: back  Pain Descriptors / Indicators: Aching Pain Intervention(s): Monitored during session    Home Living Family/patient expects to be discharged to:: Private residence Living Arrangements: Spouse/significant other Available Help at Discharge: Family Type of Home: House Home Access: Level entry     Home Layout: Two level;Bed/bath upstairs Home Equipment: None      Prior Function Level of Independence: Independent               Hand Dominance        Extremity/Trunk Assessment               Lower Extremity Assessment: Overall WFL for tasks assessed         Communication   Communication: No difficulties  Cognition Arousal/Alertness: Awake/alert Behavior During Therapy: WFL for tasks assessed/performed Overall Cognitive Status: Within Functional Limits for tasks assessed                      General Comments      Exercises        Assessment/Plan    PT Assessment Patent does not need any further PT services  PT Diagnosis Abnormality of gait   PT Problem List    PT Treatment Interventions     PT Goals (Current goals can be found in the Care  Plan section) Acute Rehab PT Goals PT Goal Formulation: All assessment and education complete, DC therapy    Frequency     Barriers to discharge        Co-evaluation               End of Session   Activity Tolerance: Patient tolerated treatment well Patient left: in chair;with call bell/phone within reach           Time: 1017-1033 PT Time Calculation (min) (ACUTE ONLY): 16 min   Charges:   PT Evaluation $Initial PT Evaluation Tier I: 1 Procedure     PT G Codes:        Goble Fudala,CYNDI 12/23/2014, 10:38 AM  Magda Kiel, PT [phone] 12/23/2014

## 2014-12-08 NOTE — Progress Notes (Signed)
Subjective: Charles Hart was sitting up in his chair. He says he has no complaints. He specifically denied any chest pain, SOB. He thinks his swelling is greatly improved. He wants to go home and had concern that he is missing his cardiology appointment today since he is hospitalized. We reminded him to keep all follow-up appointments and to cooperate with blood draws at these visits.   Objective: Vital signs in last 24 hours: Filed Vitals:   12/07/14 2108 12/08/14 0500 12/08/14 0605 12/08/14 1034  BP: 168/83  155/74 167/79  Pulse: 74  72 83  Temp: 98.8 F (37.1 C)  98.2 F (36.8 C)   TempSrc: Oral  Oral   Resp: 17  18   Height:      Weight:  206 lb 14.4 oz (93.849 kg)    SpO2: 100%  99% 98%   Weight change: -3 lb 10.2 oz (-1.651 kg)  Intake/Output Summary (Last 24 hours) at 12/08/14 1117 Last data filed at 12/08/14 1004  Gross per 24 hour  Intake    100 ml  Output   2260 ml  Net  -2160 ml   Gen: A&O x 4, No acute distress, well developed, well nourished HEENT: Atraumatic, PERRL, EOMI, sclerae anicteric, moist mucous membranes Heart: Regular rate and rhythm, normal S1 S2, no murmurs, rubs, or gallops Lungs: Clear to auscultation bilaterally, respirations unlabored Abd: Soft, non-tender, non-distended, + bowel sounds, no hepatosplenomegaly Ext: 0-1+ b/l pitting edema to shins  Lab Results: Basic Metabolic Panel:  Recent Labs Lab 12/07/14 0404 12/08/14 0744  NA 136 136  K 4.0 3.8  CL 100 99  CO2 26 27  GLUCOSE 137* 148*  BUN 39* 44*  CREATININE 2.43* 2.46*  CALCIUM 8.6 8.8  MG 1.6  --   PHOS 4.5  --    Liver Function Tests:  Recent Labs Lab 12/07/14 0404  ALBUMIN 2.5*   CBC:  Recent Labs Lab 12/07/14 0404 12/08/14 0744  WBC 6.8 4.4  HGB 7.4* 7.1*  HCT 23.1* 22.0*  MCV 84.9 84.3  PLT 239 265   Cardiac Enzymes:  Recent Labs Lab 12/06/14 0351 12/06/14 1026 12/07/14 0404  TROPONINI 0.06* 0.06* 0.06*   CBG:  Recent Labs Lab 12/06/14 2135  12/07/14 0745 12/07/14 1240 12/07/14 1733 12/07/14 2105 12/08/14 0806  GLUCAP 171* 139* 170* 89 141* 146*     Micro Results: Recent Results (from the past 240 hour(s))  MRSA PCR Screening     Status: None   Collection Time: 12/06/14  1:03 AM  Result Value Ref Range Status   MRSA by PCR NEGATIVE NEGATIVE Final    Comment:        The GeneXpert MRSA Assay (FDA approved for NASAL specimens only), is one component of a comprehensive MRSA colonization surveillance program. It is not intended to diagnose MRSA infection nor to guide or monitor treatment for MRSA infections.    Studies/Results: Dg Chest Port 1 View  12/07/2014   CLINICAL DATA:  Acute pulmonary edema  EXAM: PORTABLE CHEST - 1 VIEW  COMPARISON:  12/05/2014  FINDINGS: Improving aeration bilaterally is noted with near complete resolution of previously seen fluffy perihilar airspace opacities. No significant pleural effusion. Mild enlargement of the cardiac silhouette reidentified.  IMPRESSION: Improving pulmonary aeration with near complete resolution of previously seen perihilar airspace opacities.   Electronically Signed   By: Conchita Paris M.D.   On: 12/07/2014 09:22   TTE: Study Conclusions  - Left ventricle: Wall thickness was increased in  a pattern of moderate to severe LVH. Systolic function was severely reduced. The estimated ejection fraction was in the range of 25% to 30%. Diffuse hypokinesis. Features are consistent with a pseudonormal left ventricular filling pattern, with concomitant abnormal relaxation and increased filling pressure (grade 2 diastolic dysfunction). - Mitral valve: There was mild regurgitation. - Left atrium: The atrium was mildly dilated. - Right atrium: The atrium was mildly dilated.  Medications: I have reviewed the patient's current medications. Scheduled Meds: . antiseptic oral rinse  7 mL Mouth Rinse BID  . calcium-vitamin D  1 tablet Oral BID  . cloNIDine  0.3 mg  Oral BID  . furosemide  40 mg Oral BID  . heparin  5,000 Units Subcutaneous 3 times per day  . hydrALAZINE  100 mg Oral 3 times per day  . insulin aspart  0-15 Units Subcutaneous TID WC  . labetalol  100 mg Oral BID   Continuous Infusions:  PRN Meds:.sodium chloride, ondansetron Assessment/Plan: Active Problems:   Hypertensive emergency   Acute pulmonary edema   Respiratory distress  #Pulmonary Edema 2/2 acute combined CHF exacerbation: Charles Krotzer presented with likely flash pulmonary edema secondary to his poorly controlled HTN. TTE with EF [id number], grade 2 diastolic dysfunction. While under [name] care, he received a total lasix 200 iv. Net - 9.2L and down 20 lbs. Repeat CXR shows nearly complete resolution of previous perihilar opacities (portable so also not as good as 2 view). On exam his lungs are clear. He is asymptomatic. Legs with some mild 0-1+ pitting edema to shins. Creatinine stable at 2.46 -lasix 40 mg po bid -labetalol 100 mg bid -TED hose -daily weight, I&Os -rescheduled missed appointment with Dr [name]  #HTN: Charles Mingus presented with uncontrolled HTN. He was placed on nitro drip per [name] which was weaned prior to transfer to IMTS. At home he is on clonidine 0.3 mg bid, hydralazine 100 mg tid, labetalol 300 mg bid, as well as recently started diltiazam 180 mg daily, lasix 60 mg daily. These last two were started after recent admission earlier this month when his HCTZ and ARB were held in setting of vancomycin/contrast induced AKI which has since resolved. Overnight he was 130s-160s/50s-80s while on clonidine 0.2 mg bid, lasix 40 mg bid, hydralazine 100 mg tid, and labetalol 100 mg bid. -cont clonidine 0.3 mg bid, labetalol 100 mg bid, hydralazine 100 mg tid, and lasix 40 mg po bid -PCP follow-up consider adding ARB for his CHF -cont to monitor  #CKD III: Creatinine currently 2.46, stable from 2.43 yesterday after aggressive diuresis. He was recently hospitalized with AKI  thought to be vancomycin and contrast induced. Creatinine went up to 5.72 and was down to 3.72 on discharge. On several follow-up clinic visits, he refused blood draws. On presentation this visit creatinine 2.41. -cont to monitor  #Chronic normocytic anemia: Hemoglobin 7.1 w MCV 85, down from 8.6 on presentation but stable for past three days. Likely secondary to his CKD. His baseline hemoglobin appears to be 8-10 but has been steadily trending down for past four years. He denies any melena or hematachezia. -cont to monitor  #DM2: Last A1c 10/2014 was 7.7. At home he is on lantus 30 u qhs, and novolog 7 u tidwc. AM CBG 148 without lantus. -cont SSI-mod  Dispo: Disposition is deferred at this time, awaiting improvement of current medical problems.  Anticipated discharge in approximately 0-1 day(s).   The patient does have a current PCP (Carly Montey Hora, MD) and does need  an [facility] hospital follow-up appointment after discharge.  The patient does not know have transportation limitations that hinder transportation to clinic appointments.  .Services Needed at time of discharge: Y = Yes, Blank = No PT:   OT:   RN:   Equipment:   Other:     LOS: 3 days   Kelby Aline, MD 12/08/2014, 11:17 AM

## 2014-12-08 NOTE — Clinical Documentation Improvement (Signed)
  Patient with hx Chronic Diastolic CHF admitted with HTN emergency and "flash pulmonary edema", "acute pulmonary edema" with BNP 797.6 treated with IV Lasix and Bipap. Please clarify these terms to better illustrate this patient's severity of illness and risk of mortality.  . Possible conditions --Acute on chronic diastolic chf secondary to HTN --Acute on chronic diastolic chf secondary to Hypertensive kidney disease --Chronic diastolic chf --Other condition --Unable to clinically determine  Thank You,  Barrie Dunker RN CDI\ [phone] HIM department

## 2014-12-08 NOTE — Consult Note (Signed)
   [facility] CM Inpatient Consult   12/08/2014  Charles Hart 04-30-1956 [name] Referral received.  Patient evaluated for [facility] Management services.  Patient is not currently eligible for [facility] Care Management services because of not having a [facility] affiliated delegation with the Medicare plan.  For any additional questions or new referrals please contact: Natividad Brood, [facility] Liaison  [phone] business mobile phone

## 2014-12-16 ENCOUNTER — Ambulatory Visit: Payer: Medicare Other | Admitting: Internal Medicine

## 2014-12-22 ENCOUNTER — Other Ambulatory Visit: Payer: Self-pay | Admitting: Internal Medicine

## 2014-12-22 NOTE — Progress Notes (Signed)
59 y.o. with poorly controlled HTN and DM  complicated by CRF.  History of DCM when not compliant with meds with EF as low as 25-30%  Improved to normal by echo in 2014 with better med compliance.  Recently d/c from [facility] 4/26 after being admitted with hypertensive urgency, worsening renal function Cr 2.46 but as high as 5 on admission. Echo 12/07/14  Reviewed   - Left ventricle: Wall thickness was increased in a pattern of moderate to severe LVH. Systolic function was severely reduced. The estimated ejection fraction was in the range of 25% to 30%. Diffuse hypokinesis. Features are consistent with a pseudonormal left ventricular filling pattern, with concomitant abnormal relaxation and increased filling pressure (grade 2 diastolic dysfunction). - Mitral valve: There was mild regurgitation. - Left atrium: The atrium was mildly dilated. - Right atrium: The atrium was mildly dilated.  Troponin negative BNP 796   No CAD by cath in 2004 and 2008  Also has nephrotic syndrome and OSA   Seen by Dr Lorrene Reid Renal.  Has had biopsy showing diabetic nephropathy.  Thought acute rise in Cr from BP swings, CHF and NSAI use   Compliant with meds doing well since d/c  Does not add salt to diet     ROS: Denies fever, malais, weight loss, blurry vision, decreased visual acuity, cough, sputum, SOB, hemoptysis, pleuritic pain, palpitaitons, heartburn, abdominal pain, melena, lower extremity edema, claudication, or rash.  All other systems reviewed and negative  General: Affect appropriate Healthy:  appears stated age 86: normal Neck supple with no adenopathy JVP normal no bruits no thyromegaly Lungs clear with no wheezing and good diaphragmatic motion Heart:  S1/S2 no murmur, no rub, gallop or click PMI normal Abdomen: benighn, BS positve, no tenderness, no AAA no bruit.  No HSM or HJR Distal pulses intact with no bruits Plus one bilateral  edema Neuro non-focal Skin warm and dry No  muscular weakness   Current Outpatient Prescriptions  Medication Sig Dispense Refill  . ACCU-CHEK FASTCLIX LANCETS MISC Use to check blood sugar as directed 3 times a day. Dx code E11.65, on insulin. 102 each 2  . ACCU-CHEK SMARTVIEW test strip CHECK BLOOD SUGAR LEVELS UP TO THREE TIMES DAILY AS DIRECTED 100 each 5  . acetaminophen (TYLENOL) 500 MG tablet Take 500 mg by mouth every 6 (six) hours as needed (pain).     . calcium-vitamin D (OSCAL WITH D) 500-200 MG-UNIT per tablet Take 1 tablet by mouth 2 (two) times daily. 60 tablet 0  . CARTIA XT 180 MG 24 hr capsule Take 180 mg by mouth daily.  3  . cloNIDine (CATAPRES) 0.3 MG tablet Take 1 tablet (0.3 mg total) by mouth 2 (two) times daily. 60 tablet 11  . clopidogrel (PLAVIX) 75 MG tablet Take 1 tablet (75 mg total) by mouth daily. 90 tablet 3  . fluticasone (FLONASE) 50 MCG/ACT nasal spray Place 2 sprays into both nostrils daily as needed for allergies or rhinitis.    . furosemide (LASIX) 40 MG tablet TAKE 1 TABLET BY MOUTH TWICE DAILY 60 tablet 0  . gabapentin (NEURONTIN) 300 MG capsule Take 1 capsule (300 mg total) by mouth every morning. (Patient taking differently: Take 300 mg by mouth at bedtime. ) 90 capsule 1  . hydrALAZINE (APRESOLINE) 50 MG tablet Take 100 mg by mouth 3 (three) times daily.    . Insulin Pen Needle 31G X 5 MM MISC 1 Units by Does not apply route 4 (four) times daily as  needed. 120 each 3  . labetalol (NORMODYNE) 300 MG tablet Take 300 mg by mouth daily.  3  . LANTUS SOLOSTAR 100 UNIT/ML Solostar Pen Inject 25 Units into the skin daily.  0  . Multiple Vitamin (MULITIVITAMIN WITH MINERALS) TABS Take 1 tablet by mouth every morning.     [name] NOVOLOG FLEXPEN 100 UNIT/ML FlexPen Inject 5 Units into the skin daily.  9  . pravastatin (PRAVACHOL) 40 MG tablet Take 1 tablet (40 mg total) by mouth daily. 90 tablet 3   No current facility-administered medications for this visit.    Allergies  Amlodipine and  Lisinopril  Electrocardiogram:  12/09/14  SR Rate 93  LVH with strain   Assessment and Plan CHF:  Refer to CHF clinic Difficult issue with low EF and renal issues.  With compliance much better  Discussed sliding scale lasix currently 40 bid CRF:  Seeing renal tomorrow  Stage 4  Avoid nephrotoxins and NSAI's   Chol;  On statin  HTN continue multiple drug Rx discussed rebound issues with clonidine   DM:  Discussed low carb diet.  Target hemoglobin A1c is 6.5 or less.  Continue current medications.

## 2014-12-23 ENCOUNTER — Encounter: Payer: Medicare Other | Admitting: Internal Medicine

## 2014-12-23 NOTE — Patient Outreach (Signed)
[facility]) Care Management  12/23/2014  Charles Hart 05-21-1956 [name]   Referral from [place], [name], RN assigned.  [name]. [place] CM Assistant Phone: [phone] Fax: [phone]

## 2014-12-24 ENCOUNTER — Encounter: Payer: Self-pay | Admitting: Cardiovascular Disease

## 2014-12-24 ENCOUNTER — Ambulatory Visit (INDEPENDENT_AMBULATORY_CARE_PROVIDER_SITE_OTHER): Payer: PPO | Admitting: Cardiovascular Disease

## 2014-12-24 VITALS — BP 142/70 | HR 82 | Ht 72.0 in | Wt 213.4 lb

## 2014-12-24 DIAGNOSIS — I5022 Chronic systolic (congestive) heart failure: Secondary | ICD-10-CM

## 2014-12-24 NOTE — Patient Instructions (Signed)
Medication Instructions:  NO CHANGES  Labwork: NONE  Testing/Procedures: NONE  Follow-Up: Your physician recommends that you schedule a follow-up appointment in: 3 MONTHS  WITH DR [name]  AND  NEEDS  CHF  APPT  ASAP Any Other Special Instructions Will Be Listed Below (If Applicable).

## 2014-12-25 ENCOUNTER — Encounter: Payer: Self-pay | Admitting: Internal Medicine

## 2014-12-25 ENCOUNTER — Ambulatory Visit (INDEPENDENT_AMBULATORY_CARE_PROVIDER_SITE_OTHER): Payer: PPO | Admitting: Internal Medicine

## 2014-12-25 ENCOUNTER — Other Ambulatory Visit: Payer: Self-pay

## 2014-12-25 VITALS — BP 132/62 | HR 71 | Temp 98.0°F | Ht 69.0 in | Wt 215.7 lb

## 2014-12-25 DIAGNOSIS — E1165 Type 2 diabetes mellitus with hyperglycemia: Secondary | ICD-10-CM

## 2014-12-25 DIAGNOSIS — E1122 Type 2 diabetes mellitus with diabetic chronic kidney disease: Secondary | ICD-10-CM | POA: Diagnosis not present

## 2014-12-25 DIAGNOSIS — I5042 Chronic combined systolic (congestive) and diastolic (congestive) heart failure: Secondary | ICD-10-CM

## 2014-12-25 DIAGNOSIS — I130 Hypertensive heart and chronic kidney disease with heart failure and stage 1 through stage 4 chronic kidney disease, or unspecified chronic kidney disease: Secondary | ICD-10-CM | POA: Diagnosis not present

## 2014-12-25 DIAGNOSIS — IMO0002 Reserved for concepts with insufficient information to code with codable children: Secondary | ICD-10-CM

## 2014-12-25 DIAGNOSIS — N183 Chronic kidney disease, stage 3 unspecified: Secondary | ICD-10-CM

## 2014-12-25 DIAGNOSIS — I10 Essential (primary) hypertension: Secondary | ICD-10-CM

## 2014-12-25 DIAGNOSIS — Z794 Long term (current) use of insulin: Secondary | ICD-10-CM

## 2014-12-25 DIAGNOSIS — E1129 Type 2 diabetes mellitus with other diabetic kidney complication: Secondary | ICD-10-CM

## 2014-12-25 LAB — GLUCOSE, CAPILLARY: Glucose-Capillary: 189 mg/dL — ABNORMAL HIGH (ref 65–99)

## 2014-12-25 MED ORDER — CARTIA XT 180 MG PO CP24: 180.0000 mg | ORAL_CAPSULE | Freq: Every day | ORAL | Status: DC

## 2014-12-25 MED ORDER — INSULIN PEN NEEDLE 31G X 4 MM MISC: 1.0000 [IU] | Freq: Four times a day (QID) | Status: DC

## 2014-12-25 MED ORDER — CLONIDINE HCL 0.3 MG PO TABS: 0.3000 mg | ORAL_TABLET | Freq: Two times a day (BID) | ORAL | Status: DC

## 2014-12-25 NOTE — Progress Notes (Signed)
[facility] Subjective:   Patient ID: Charles Hart male   DOB: Jun 24, 1956 59 y.o.   MRN: [name]  HPI: [name] is a 59 y.o. male with a PMH detailed below who presents for HFU.  He was recently discharged after a complicated hosptial stay where he was admitted to the ICU for decompensated heart failure.  Prior to this we had been trying to manage his BP and CHF symptoms as an outpatient with frequent visits but this was complicated by his refusal for obtaining blood work.  It appears he canceled his HFU 1 week after his discharge but did follow up with Cardiology yesterday and was referred to the CHF clinic. He reports he has an appointment with them next week.   He is with his daughter who has been living with him for the last month to help him manage his medical illnesses.  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 And he saw her today.  HTN He is pleased with his work to improve his BP, he is requesting 3 month prescriptions to try to stay on top of all of his meds.  He does bring his meds and knows how much he takes and what the medications are for.  DM For his diabetes he has been taking 30 u of lantus and between 2-7 units of novolog with meals. He brings his meter and download shows a high of 207 and low of 93, he appears to be managing his diabetes very well.   Past Medical History  Diagnosis Date  . Dermatitis   . CHF (congestive heart failure)     LV function improved from 2004 to 2008.  Historically, moderately dilated LV with EF 30-40% by 2D echo 08/14/2002.  Mild CAD with severe LV dysfunction by cardiac cath 09/2002.  Normal coronary arteries and normal LV function by cardiac cath 09/19/2006.  A 2-D echo on 04/01/2009 showed mild concentric hypertrophy and normal systolic (LVEF  123456) and doppler C/W with grade 1 diastolic dysfunction..   . Hypertension   . Hyperlipidemia   . Hearing loss in right  ear   . Cardiomyopathy     LV function improved from 2004 to 2008.  Historically, moderately dilated LV with EF 30-40% by 2D echo 08/14/2002.  Mild CAD with severe LV dysfunction by cardiac cath 09/2002.  Normal coronary arteries and normal LV function by cardiac cath 09/19/2006.  A 2-D echo on 04/01/2009 showed mild concentric hypertrophy and normal systolic (LVEF  123456) and doppler C/W with grade 1 diastolic dysfunction.  . DM neuropathy, painful   . CVA (cerebral vascular accident) 07/04/2012    MRI of the brain 07/04/2012 showed an acute infarct in the right basal ganglia involving the anterior putamen, anterior limb internal capsule, and head of the caudate; this measured approximately 2.5 cm in diameter.     . Hypertensive crisis 07/28/2012  . Myocardial infarction   . Diabetes mellitus     type 2  . Hypertensive urgency 08/20/2014  . Nephrotic syndrome 02/18/2013    A 24-hour urine collection 03/04/2013 showed total protein of 5,460 g and creatinine clearance of 80 mL/minute.  Patient was seen by Seward Meth at [place] and a repeat 24-hour urine showed 10,407 mg protein.  Patient underwent kidney biopsy on 05/30/2013; pathology showed advanced diffuse and nodular diabetic nephropathy with vascular changes consistent with long-standing difficult to control hypertension.     [name]  Adenomatous colon polyp 07/02/2011    Last colonoscopy May 06, 2011 by Dr. Owens Loffler, who recommended repeat colonoscopy in 5 years.   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 Background diabetic retinopathy 04/20/2012    Patient is followed by Dr. Katy Fitch   . Chronic combined systolic and diastolic congestive heart failure 05/21/2010    LV function improved from 2004 to 2008.  Historically, moderately dilated LV with EF 30-40% by 2D echo 08/14/2002.  Mild CAD with severe LV dysfunction by cardiac cath 09/2002.  Normal coronary  arteries and normal LV function by cardiac cath 09/19/2006.  A 2-D echo on 04/01/2009 showed mild concentric hypertrophy and normal systolic (LVEF  123456) and doppler parameters consistent with abnormal left    Current Outpatient Prescriptions  Medication Sig Dispense Refill  . acetaminophen (TYLENOL) 500 MG tablet Take 500 mg by mouth every 6 (six) hours as needed (pain).     Geronimo Boot XT 180 MG 24 hr capsule Take 1 capsule (180 mg total) by mouth daily. 90 capsule 1  . cloNIDine (CATAPRES) 0.3 MG tablet Take 1 tablet (0.3 mg total) by mouth 2 (two) times daily. 180 tablet 1  . clopidogrel (PLAVIX) 75 MG tablet Take 1 tablet (75 mg total) by mouth daily. 90 tablet 3  . furosemide (LASIX) 40 MG tablet TAKE 1 TABLET BY MOUTH TWICE DAILY 60 tablet 0  . gabapentin (NEURONTIN) 300 MG capsule Take 1 capsule (300 mg total) by mouth every morning. (Patient taking differently: Take 300 mg by mouth at bedtime. ) 90 capsule 1  . hydrALAZINE (APRESOLINE) 50 MG tablet Take 100 mg by mouth 3 (three) times daily.    . Insulin Pen Needle 31G X 4 MM MISC 1 Units by Does not apply route 4 (four) times daily. 100 each 11  . labetalol (NORMODYNE) 300 MG tablet Take 300 mg by mouth daily.  3  . LANTUS SOLOSTAR 100 UNIT/ML Solostar Pen Inject 30 Units into the skin daily.   0  . Multiple Vitamin (MULITIVITAMIN WITH MINERALS) TABS Take 1 tablet by mouth every morning.     [name] NOVOLOG FLEXPEN 100 UNIT/ML FlexPen Inject 2-7 Units into the skin daily.   9  . pravastatin (PRAVACHOL) 40 MG tablet Take 1 tablet (40 mg total) by mouth daily. 90 tablet 3  . ACCU-CHEK FASTCLIX LANCETS MISC Use to check blood sugar as directed 3 times a day. Dx code E11.65, on insulin. 102 each 2  . ACCU-CHEK SMARTVIEW test strip CHECK BLOOD SUGAR LEVELS UP TO THREE TIMES DAILY AS DIRECTED 100 each 5  . calcium-vitamin D (OSCAL WITH D) 500-200 MG-UNIT per tablet Take 1 tablet by mouth 2 (two) times daily. (Patient not taking: Reported on 12/25/2014)  60 tablet 0  . fluticasone (FLONASE) 50 MCG/ACT nasal spray Place 2 sprays into both nostrils daily as needed for allergies or rhinitis.     No current facility-administered medications for this visit.   Family History  Problem Relation Age of Onset  . Colon cancer Sister   . Aneurysm Father 21    died of rupture   History   Social History  . Marital Status: Married    Spouse Name: N/A  . Number of Children: N/A  . Years of Education: N/A   Social History Main Topics  . Smoking status: Never Smoker   . Smokeless tobacco: Never Used  . Alcohol Use: No  Comment: Wine occasionally (no more than 2 glasses per month)  . Drug Use: No  . Sexual Activity: Not Currently   Other Topics Concern  . None   Social History Narrative   Review of Systems: Review of Systems  Constitutional: Negative for fever, chills, weight loss and malaise/fatigue.  Eyes: Negative for blurred vision.  Respiratory: Negative for cough and shortness of breath.   Cardiovascular: Negative for chest pain.  Gastrointestinal: Negative for heartburn, abdominal pain and diarrhea.  Genitourinary: Negative for dysuria.  Musculoskeletal: Negative for myalgias.  Skin: Negative for rash.  Neurological: Negative for dizziness and headaches.     Objective:  Physical Exam: Filed Vitals:   12/25/14 1504 12/25/14 1531  BP: 147/62 132/62  Pulse: 71   Temp: 98 F (36.7 C)   TempSrc: Oral   Height: 5\' 9"  (1.753 m)   Weight: 215 lb 11.2 oz (97.841 kg)   SpO2: 99%   Physical Exam  Constitutional: He is well-developed, well-nourished, and in no distress.  HENT:  Head: Normocephalic and atraumatic.  Cardiovascular: Normal rate, regular rhythm and normal heart sounds.   Pulmonary/Chest: Effort normal and breath sounds normal. No respiratory distress. He has no wheezes.  Abdominal: Soft. Bowel sounds are normal.  Musculoskeletal: He exhibits edema (1+ pitting edema bilaterally).  Nursing note and vitals  reviewed.   Assessment & Plan:  Case discussed with Dr. Ellwood Dense  Essential hypertension BP Readings from Last 3 Encounters:  12/25/14 132/62  12/24/14 142/70  12/08/14 144/76    Lab Results  Component Value Date   NA 136 12/08/2014   K 3.8 12/08/2014   CREATININE 2.46* 12/08/2014    Assessment: Blood pressure control: controlled Progress toward BP goal:  at goal Comments:   Plan: Medications:  Continue current medications Educational resources provided: brochure Self management tools provided:   Other plans: labs checked today at nephrology will follow up records from nephrology    Chronic combined systolic and diastolic congestive heart failure - Lungs CTA but with some mild pitting edema.  He is sent to establish next week with the Heart Failure clinic which will be good for him. - BP at goal - Reviewed lasix 40mg  BID, and HF instructions of increasing and calling office if weight increases.   DM (diabetes mellitus), type 2, uncontrolled, with renal complications Lab Results  Component Value Date   HGBA1C 7.7 11/04/2014   HGBA1C 6.4 09/02/2014   HGBA1C 11.0 04/25/2014     Assessment: Diabetes control: fair control Progress toward A1C goal:  improved Comments: Reviewed Glucometer log which shows good BS control  Plan: Medications:  Continue lantus 30 u daily with 2-7 u of novolog depending on meal size (carbohydrate load) Home glucose monitoring: Frequency: 3 times a day Timing: before breakfast, before dinner Instruction/counseling given: discussed diet Educational resources provided: handout Self management tools provided: copy of home glucose meter download Other plans:       CKD stage 3 due to type 2 diabetes mellitus and HTN Patient is borderline CKD stage 3/4, he just saw his nephrologist Dr Lorrene Reid earlier today.  He brings his meds and appears to be managing them well. -Will continue to focus on tighter BP control. -F/U notes from  Nephrology - [facility] is involved in care to provide updates on how patient is progressing since discharge.     Medications Ordered Meds ordered this encounter  Medications  . Insulin Pen Needle 31G X 4 MM MISC    Sig: 1 Units by Does  not apply route 4 (four) times daily.    Dispense:  100 each    Refill:  11    The patient is insulin requiring, ICD 10 code E11.29. The patient injects insulin 4 times per day.  . cloNIDine (CATAPRES) 0.3 MG tablet    Sig: Take 1 tablet (0.3 mg total) by mouth 2 (two) times daily.    Dispense:  180 tablet    Refill:  1  . CARTIA XT 180 MG 24 hr capsule    Sig: Take 1 capsule (180 mg total) by mouth daily.    Dispense:  90 capsule    Refill:  1   Other Orders Orders Placed This Encounter  Procedures  . Glucose, capillary

## 2014-12-25 NOTE — Progress Notes (Signed)
Internal Medicine Clinic Attending  Case discussed with Dr. Hoffman at the time of the visit.  We reviewed the resident's history and exam and pertinent patient test results.  I agree with the assessment, diagnosis, and plan of care documented in the resident's note.  

## 2014-12-25 NOTE — Patient Instructions (Signed)
General Instructions:  Keep up the good work.  Please bring your medicines with you each time you come to clinic.  Medicines may include prescription medications, over-the-counter medications, herbal remedies, eye drops, vitamins, or other pills.   Progress Toward Treatment Goals:  Treatment Goal 12/25/2014  Hemoglobin A1C improved  Blood pressure at goal    Self Care Goals & Plans:  Self Care Goal 12/25/2014  Manage my medications take my medicines as prescribed; bring my medications to every visit; refill my medications on time  Monitor my health bring my glucose meter and log to each visit; check my feet daily; keep track of my blood glucose  Eat healthy foods eat foods that are low in salt; eat more vegetables; eat baked foods instead of fried foods  Be physically active take a walk every day  Meeting treatment goals -    Home Blood Glucose Monitoring 12/25/2014  Check my blood sugar 3 times a day  When to check my blood sugar before breakfast; before dinner     Care Management & Community Referrals:  Referral 12/25/2014  Referrals made for care management support none needed  Referrals made to community resources -
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
## 2014-12-26 ENCOUNTER — Other Ambulatory Visit: Payer: Self-pay

## 2014-12-26 ENCOUNTER — Telehealth: Payer: Self-pay

## 2014-12-26 NOTE — Assessment & Plan Note (Signed)
Lab Results  Component Value Date   HGBA1C 7.7 11/04/2014   HGBA1C 6.4 09/02/2014   HGBA1C 11.0 04/25/2014     Assessment: Diabetes control: fair control Progress toward A1C goal:  improved Comments: Reviewed Glucometer log which shows good BS control  Plan: Medications:  Continue lantus 30 u daily with 2-7 u of novolog depending on meal size (carbohydrate load) Home glucose monitoring: Frequency: 3 times a day Timing: before breakfast, before dinner Instruction/counseling given: discussed diet Educational resources provided: handout Self management tools provided: copy of home glucose meter download Other plans:

## 2014-12-26 NOTE — Assessment & Plan Note (Signed)
-   Lungs CTA but with some mild pitting edema.  He is sent to establish next week with the Heart Failure clinic which will be good for him. - BP at goal - Reviewed lasix 40mg  BID, and HF instructions of increasing and calling office if weight increases.

## 2014-12-26 NOTE — Patient Outreach (Signed)
[facility]) Care Management  12/26/2014  SHRIYAAN MCAREE 13-Apr-1956 [name]   Dr. Heber Stout,  Please see assessment completed with patient on [facility].   I have made a pharmacy referral to review medications to determine if there are any ways to decrease cost of med's such as changing insulin pens to vials. Will be in touch following pharmacy assessment.  Thank you   Assessment faxed to Dr. Heber Wirt: Patient involvement letter sent to Dr. Heber [place] Patient successful outreach letter sent to patient.  Bono Assistant notified Case open/active 12/25/2014  [name], RN, BSN, [facility], [place] Management Care Management Coordinator [phone] Office [phone] Direct [phone] Cell

## 2014-12-26 NOTE — Assessment & Plan Note (Signed)
Patient is borderline CKD stage 3/4, he just saw his nephrologist Dr Lorrene Reid earlier today.  He brings his meds and appears to be managing them well. -Will continue to focus on tighter BP control. -F/U notes from Nephrology - [facility] is involved in care to provide updates on how patient is progressing since discharge.

## 2014-12-26 NOTE — Patient Outreach (Addendum)
[facility]) Care Management  12/26/2014  Charles Hart 12/24/1955 [name]   Care Coordination Note:  RN CM contacted patient via phone to provide the following update.  -Diabetic meter, test strips and lancets - free -Insulin Pen Needles 37.55 -Clonidine, furosemide, hydralazine on the $4.00 list -clopidogrel 31.92 (3 month supply) -Cartia XT 180mg  24 hour capsule $45.00 -Lantus Solostar Pen $90 for 2 month supply or 45 / month -Novolog Flexpen 45 / month  Patient has deductible to meet so co-pays are going toward deductible  Brand name drugs are $45.00.  Pharmacy has order for Diabetic testing for 3 times a day   RN CM instructed patient may request to only purchase one month supply at a time if this helps with cost management. RN CM instructed referral sent to [place] to consult for other possible cost savings such as insulin pens to vials and / or changing any brand name drugs to generics.  Confirmed patient has never used vials before.  RN CM instructed that patient can be taught to use vials and patient agreed he would be willing to try.  RN CM advised that pharmacy will review for any possible med changes that can be converted to generic drugs.     [name], RN, BSN, [facility], CCM  [employer] Management Coordinator [phone] Office [phone] Direct [phone] Cell

## 2014-12-26 NOTE — Patient Outreach (Signed)
[facility]) Care Management  12/26/2014  EZREAL MARSHBURN 21-Jun-1956 [name]   Care Coordination Note:  RN CM contacted [facility] Pharmacist and confirmed the following  -Diabetic meter, test strips and lancets - free -Insulin Pen Needles 37.55 -Clonidine, furosemide, hydralazine on the $4.00 list -clopidogrel 31.92 (3 month supply) -Cartia XT 180mg  24 hour capsule $45.00 -Lantus Solostar Pen $90 for 2 month supply or 45 / month -Novolog Flexpen 45 / month  Patient has deductible to meet so co-pays are going toward deductible  Brand name drugs are $45.00.   RN CM confirmed that pharmacy has order for Diabetic testing for 3 times a day  RN CM sent referral to [place] to consult for other possible cost savings such as insulin pens to vials and / or changing any brand name drugs to generics.  RN CM will contact patient with above updates.   [name], RN, BSN, [facility], CCM  [employer] Management Coordinator [phone] Office [phone] Direct [phone] Cell

## 2014-12-26 NOTE — Assessment & Plan Note (Signed)
BP Readings from Last 3 Encounters:  12/25/14 132/62  12/24/14 142/70  12/08/14 144/76    Lab Results  Component Value Date   NA 136 12/08/2014   K 3.8 12/08/2014   CREATININE 2.46* 12/08/2014    Assessment: Blood pressure control: controlled Progress toward BP goal:  at goal Comments:   Plan: Medications:  Continue current medications Educational resources provided: brochure Self management tools provided:   Other plans: labs checked today at nephrology will follow up records from nephrology

## 2014-12-29 ENCOUNTER — Other Ambulatory Visit: Payer: Self-pay | Admitting: Pharmacist

## 2014-12-29 NOTE — Patient Outreach (Signed)
KINGDAVID TODHUNTER is a 60 y.o. male referred to pharmacy for medication assistance. Per referral, patient has had trouble affording his medications, particularly his Lantus and Novolog insulin, each of which has a $45/month copay.  Called and spoke with Mr. Bafford. Reviewed with patient his current insulin dosing. Patient reports that the dose of his Lantus has been increased, as reflected in EPIC, from the directions on his current prescription with [facility]. Patient reports a sliding scale dose of Novolog of 2-7 units three times daily.  Spoke with patient about completing an Extra Help application to help him to pay for his medications. Patient reports that he is interested in completing this with me and that he has had the Extra Help assistance in the past when he had [employer], but does not currently. Patient reports that he does not currently have all of his financial information with him and would prefer for me to meet with him in person.  Will meet with Mr. Steketee in his home on 12/31/14 at 10 AM to complete the Extra Help Application and review patient's medication with him.   [name], PharmD Clinical Pharmacist [place] [facility] [phone]

## 2014-12-29 NOTE — Patient Outreach (Signed)
[name]) Care Management  12/29/2014  Charles Hart Aug 10, 1956 [name]    [name], RN, [name], [name]  [name]. [name] [name]

## 2014-12-31 ENCOUNTER — Other Ambulatory Visit: Payer: Self-pay | Admitting: Pharmacist

## 2014-12-31 NOTE — Patient Outreach (Addendum)
[name]) Care Management  [place]   12/31/2014  Charles Hart 12-18-1955 [id number]  Subjective: Charles Hart is a 59 y.o. male referred to pharmacy for medication assistance. Met with patient today in his home to complete the low income subsidy application and to review his medications with him.  Patient reports that he is feeling good today. No weight changes greater than 2 lbs, no shortness of breath or swelling.  Reports that his fasting blood sugar this morning was 161 mg/dL. Reports that this is high for him. Reports that he does bring his glucose meter to each primary care appointment to be downloaded. Patient reports that he has not had any low blood sugars recently. Discussed with patient signs and symptoms of lows, particularly those symptoms not affected by beta blocker therapy. Reviewed how to treat low blood sugar levels.  Charles Hart received a call from his nephrologist during our appointment and the patient was told that his iron is low. Patient was told that his doctor will be calling in a prescription for iron today to his pharmacy.  Completed Medicare extra help application with the patient. Also spoke with patient's wife, Charles Hart, over the phone for assistance with information in completing the application. Received permission from both the patient and his wife to submit the application based on the answers that they each provided.  Reviewed medications and their administration with patient. Patient uses two weekly pillboxes to organize his medications, one for morning and one for evening. In addition, patient has daily alarms set on his cell phone to remind him to take his morning medications and his evening medications. Reports that these pillboxes are filled by his wife. Patient reports that he rarely, less than once each week, misses taking any of his medications except the hydralazine. Reports that he misses the mid-day dose of his  hydralazine about two times each week as he is often out of the house during the day and then forgets upon returning home. Discussed use of a pillbox with an additional mid-day slot for the patient's third dose of hydralazine. Patient expressed interest. Provided patient with pillbox, but did not fill it at this time as many of the patient's medications are almost empty and to be picked up at the pharmacy today. To reduce the chance for errors, patient reports that he and his wife will fill this new pillbox this evening once he has all of his medications.  Patient reported that he received the wrong pen needles last time he went to the pharmacy, but wants the correct, 4 mm length, needles. Called pharmacy with patient to make sure that his refills are ready, verify that the correct pen needles are filled and to check if the patient's nephrologist has sent in the prescription for iron. Per pharmacy team member, pen needles have again been called in not for the 4 mm length. Requested pharmacy team contact physician's office to have this changed and to order the 4 mm length. Iron not yet called in.  Objective:   Current Medications: Current Outpatient Prescriptions  Medication Sig Dispense Refill  . ACCU-CHEK FASTCLIX LANCETS MISC Use to check blood sugar as directed 3 times a day. Dx code E11.65, on insulin. 102 each 2  . ACCU-CHEK SMARTVIEW test strip CHECK BLOOD SUGAR LEVELS UP TO THREE TIMES DAILY AS DIRECTED 100 each 5  . acetaminophen (TYLENOL) 500 MG tablet Take 500 mg by mouth every 6 (six) hours as needed (pain).     [name]  CARTIA XT 180 MG 24 hr capsule Take 1 capsule (180 mg total) by mouth daily. 90 capsule 1  . cloNIDine (CATAPRES) 0.3 MG tablet Take 1 tablet (0.3 mg total) by mouth 2 (two) times daily. 180 tablet 1  . clopidogrel (PLAVIX) 75 MG tablet Take 1 tablet (75 mg total) by mouth daily. 90 tablet 3  . fluticasone (FLONASE) 50 MCG/ACT nasal spray Place 2 sprays into both nostrils daily as  needed for allergies or rhinitis.    . furosemide (LASIX) 40 MG tablet TAKE 1 TABLET BY MOUTH TWICE DAILY 60 tablet 0  . gabapentin (NEURONTIN) 300 MG capsule Take 1 capsule (300 mg total) by mouth every morning. (Patient taking differently: Take 300 mg by mouth at bedtime. ) 90 capsule 1  . hydrALAZINE (APRESOLINE) 50 MG tablet Take 100 mg by mouth 3 (three) times daily.    . Insulin Pen Needle 31G X 4 MM MISC 1 Units by Does not apply route 4 (four) times daily. 100 each 11  . labetalol (NORMODYNE) 300 MG tablet Take 300 mg by mouth daily.  3  . LANTUS SOLOSTAR 100 UNIT/ML Solostar Pen Inject 30 Units into the skin daily.   0  . Multiple Vitamin (MULITIVITAMIN WITH MINERALS) TABS Take 1 tablet by mouth every morning.     [name] NOVOLOG FLEXPEN 100 UNIT/ML FlexPen Inject 2-7 Units into the skin daily.   9  . pravastatin (PRAVACHOL) 40 MG tablet Take 1 tablet (40 mg total) by mouth daily. 90 tablet 3  . calcium-vitamin D (OSCAL WITH D) 500-200 MG-UNIT per tablet Take 1 tablet by mouth 2 (two) times daily. (Patient not taking: Reported on 12/25/2014) 60 tablet 0   No current facility-administered medications for this visit.    Functional Status: In your present state of health, do you have any difficulty performing the following activities: 12/25/2014 12/25/2014  Hearing? N Y  Vision? N N  Difficulty concentrating or making decisions? N N  Walking or climbing stairs? N N  Dressing or bathing? N N  Doing errands, shopping? N N  Preparing Food and eating ? N -  Using the Toilet? N -  In the past six months, have you accidently leaked urine? N -  Do you have problems with loss of bowel control? N -  Managing your Medications? N -  Managing your Finances? N -  Housekeeping or managing your Housekeeping? N -    Fall/Depression Screening: PHQ 2/9 Scores 12/25/2014 12/25/2014 12/05/2014 12/03/2014 11/26/2014 11/04/2014 10/10/2014  PHQ - 2 Score 0 0 0 0 0 0 0    Assessment:  Patient is currently  non-adherent with his hydralazine mid-day dose as this is the only medication that he takes at that time and needs more reminders.  Per patient, he is unable to continue to afford his medications, particularly his insulins, at their current cost. Have completed low income subsidy application with patient today.   Patient not currently on ACE-I or ARB for heart failure treatment. Most recent ECHO on 12/07/14 showed an EF of 25-30%. ACE-I or ARB therapy recommended in all patients with heart failure and a EF <40% due to improved mortality benefit. However, note that per patient's previous notes from last hospital discharge, ACE-I and ARB therapy determined to be contraindicated in this patient due to "worsening renal function, preexisting renal disease or dysfunction". Also, note patient's previous adverse reaction of persistent cough with lisinopril therapy. Note patient previously on losartan prior to acute kidney injury. Losartan is  a tier 1 generic under the patient's insurance.  Patient is on beta blocker therapy, but not a beta blocker with trial demonstrated mortality benefit data in patients with heart failure. Current labetalol therapy is a tier 2, while carvedilol is a tier 1 medication through the patient's insurance.  Patient is on hydralazine without isosorbide. The combination of these two agents is recommended for patients having heart failure with reduced ejection fraction for mortality benefit.   Diltiazem not shown to provide mortality benefit in patients having heart failure with reduced ejection fraction and may have the potential to contribute to exacerbations. Note that diltiazem was previously noted to be discontinued at patient's last hospital discharge.   Plan:  Patient to set a mid-day alarm on his phone and use the provided pillbox to remember his midday dose. I will follow up with him in 1 week to see if he has been able to meet the goal of remembering all mid-day  doses.  Will follow up with patient once he has received a response back about his low income subsidy application. If patient is denied, will discuss patient assistance and other strategies.  Will follow up with patient on 01/02/15 to make sure that patient has been able to pick up his iron and that he has set up his new pillbox.  Will follow up with patient's nephrologist to determine if patient's renal function has improved. If improved, will discuss possible addition of ARB therapy with nephrologist and patient's PCP, Dr. Heber [place].   Will also discuss with his PCP a change in patient's beta blocker therapy from labetalol to carvedilol for both potential mortality reduction benefit and reduction of cost. Will discuss possible future addition of isosorbide therapy and possible discontinuation of diltiazem.   [name], PharmD Clinical Pharmacist [place] [facility] [phone]

## 2015-01-02 ENCOUNTER — Other Ambulatory Visit: Payer: Self-pay | Admitting: Pharmacist

## 2015-01-02 ENCOUNTER — Telehealth: Payer: Self-pay | Admitting: Cardiovascular Disease

## 2015-01-02 ENCOUNTER — Encounter: Payer: Self-pay | Admitting: Pharmacist

## 2015-01-02 MED ORDER — ISOSORBIDE DINITRATE 10 MG PO TABS: 10.0000 mg | ORAL_TABLET | Freq: Three times a day (TID) | ORAL | Status: DC

## 2015-01-02 MED ORDER — CARVEDILOL 6.25 MG PO TABS: 6.2500 mg | ORAL_TABLET | Freq: Two times a day (BID) | ORAL | Status: DC

## 2015-01-02 MED ORDER — ISOSORBIDE MONONITRATE ER 30 MG PO TB24: 30.0000 mg | ORAL_TABLET | Freq: Every day | ORAL | Status: DC

## 2015-01-02 NOTE — Telephone Encounter (Signed)
Called pharmacist back with reply from Dr [name].  This did not answer the questions the pharmacist needed to be addressed. She has sent an in box message to Dr [name] and will wait for response.

## 2015-01-02 NOTE — Patient Outreach (Signed)
Called and left a message for patient's cardiologist, Dr. [name], to discuss a change in patient's beta blocker therapy from labetalol to carvedilol for both potential mortality reduction benefit and reduction of cost. Will discuss possible future addition of isosorbide therapy and possible discontinuation of diltiazem.  [name], PharmD Clinical Pharmacist [place] [facility] [phone]

## 2015-01-02 NOTE — Telephone Encounter (Signed)
Answered questions  Ok to change labatolol to coreg 6.25 bid Ok to add isordil 10 tid Ok to stop cardizem  Has CHF clinic f/u

## 2015-01-02 NOTE — Patient Outreach (Signed)
Called Charles Hart to discuss changes to his heart failure/blood pressure medications. Informed patient that following my discussion with his cardiologist, Dr. [name] has changed his medications to improve therapy and reduce cost. Reported to patient that Dr. [name] has changed his labetolol to carvedilol 6.25 mg twice daily. Also explained that Dr. [name] has stopped his Cartia XT and has started him on isosorbide ER 30 mg daily. Discussed the reasoning behind change in his medication regimen. While on the phone, patient noted the medication changes and removed both the Cartia XT and labetalol from his medbox. Patient has already taken his Cartia dose for the day and will take his second labetalol dose, but stop both after that. He will plan to start the carvedilol and isosorbide tomorrow. Counseled patient about the importance of monitoring his blood pressure, watching for signs of dizziness and contacting his on-call cardiologist if he has any issues over the weekend. Discussed seeing how he feels on Saturday prior to driving and patient reports that his wife will be with him. Counseled patient to take carvedilol twice daily with food and that isosorbide ER should be taken once daily first thing in the morning with half a glass of water.  Confirmed that patient will be going to the heart failure clinic on Monday.   Patient reports that he did contact his nephrologist and was told that rather than sending in a prescription for iron, he will be getting IV medication at his upcoming appointment.   Will call to follow up with patient on 01/05/15.  [name], PharmD Clinical Pharmacist [place] [facility] [phone]

## 2015-01-02 NOTE — Telephone Encounter (Signed)
New Message        Clinical pharmacist calling about pt's medications that are managing his bp and heart failure calling to discuss potential changes. She would like to hear back from Dr. [name] directly. Please call back and advise.

## 2015-01-02 NOTE — Addendum Note (Signed)
Addended by: Janan Halter F on: 01/02/2015 05:11 PM   Modules accepted: Orders, Medications

## 2015-01-02 NOTE — Patient Outreach (Signed)
Called to the office of Dr. [name], patient's nephrologist, at [facility] to obtain results of patient's recent renal function panel. Received fax back, which has been scanned into EPIC. Renal function panel from 12/25/14 shows a serum creatinine of 2.44 mg/dL and a calculated eGFR of 33 ml/min.   Given patient's continued renal dysfunction, resuming his ARB therapy is still not recommended in this patient at this time. However, will contact patient's cardiologist, Dr Josue Hector, to discuss patient's medication therapy for his heart failure and blood pressure.  [name], PharmD Clinical Pharmacist [name] [facility] [phone]

## 2015-01-02 NOTE — Telephone Encounter (Signed)
He is not on ARB and "allergic" to lisinopril BP meds appropriate Lasix adjusted by [facility]/ Renal

## 2015-01-02 NOTE — Telephone Encounter (Signed)
Spoke with pharmacist and she needs to discuss pts meds. States she has consulted pts renal dr and would like to discuss with Dr [name] for changes. I told her  Dr [name] is working in the hospital today, I will message him to return the call and if needs to make contact sooner she may call the hospital and page him. She said she would also in box message him.

## 2015-01-02 NOTE — Telephone Encounter (Signed)
MAR was adjusted. Scripts sent.

## 2015-01-02 NOTE — Telephone Encounter (Addendum)
Received a call from Pharm D with [facility] and she has been corresponding back and forth with Dr [name] and says Isosorbide mononitrate will be the cheaper alternative for the patient.  I have tried calling Dr [name] to change  He sent both Pharm D And Altha Harm a message and I have changed the medication.  I will call the patient and let him know.  I called patient and left him a message that  Imdur 30 mg daily was sent into his pharmacy as it is a cheaper alternative than Isordil

## 2015-01-02 NOTE — Patient Outreach (Signed)
[facility]) Care Management  01/02/2015  MONTGOMERY FAVOR 1955/09/08 [id number]   Contacted Mr. Roger's cardiologist, Dr. [name] through an inBasket message regarding patient's heart failure medication regimen. Sent the following message:  Dr. [name],   I am a clinical pharmacist with [facility]. I had the opportunity to meet with Mr. Mose in his home this past Wednesday, 12/31/14 to assist him in completing a low income subsidy application to help reduce the cost of his medications and to review his medications with him. Based on my medication review, I noted the following:   ACE-inhibitor/ARB therapy continues to be contraindicated in this patient due to continued renal dysfunction. Most recent labs from [facility] from 12/25/14 indicate an eGFR of 33 mL/min.   Patient is on beta blocker therapy, but not a beta blocker with trial demonstrated mortality benefit data in patients with heart failure. Current labetalol therapy is a tier 2, while carvedilol is a tier 1 medication through the patient's insurance.   Patient is on hydralazine without isosorbide. The combination of these two agents is recommended for patients having heart failure with reduced ejection fraction for mortality benefit.   Diltiazem not shown to provide mortality benefit in patients having heart failure with reduced ejection fraction and may have the potential to contribute to exacerbations. Note that diltiazem was previously noted to be discontinued at patient's last hospital discharge, 12/08/14.    Would recommend a change in patient's beta blocker therapy from labetalol to carvedilol for both cost reduction and for potential trial demonstrated mortality reduction benefit. In addition, if appropriate in this patient, recommend addition of isosorbide therapy. Finally, I would like to clarify if patient is to be continuing to take diltiazem or if it was to be stopped  follow patient's hospital discharge on 12/08/14.    Thank you for your time. I look forward to speaking with you about Mr. Goeller.    Dr. [name] responded:  ----- Message -----   From: Josue Hector, MD   Sent: 01/02/2015 11:24 AM    To: Vella Raring, RPH  Subject: RE: Medication Management             Thanks for your thoughts   Ok to add isordil 10 tid  Ok to change labatolol to coreg 6.25 bid  Ok to d/c cardizem   I believe he has a f/u with CHF clinic    Replied to Dr. [name]   From: Vella Raring, [facility]   Sent: 01/02/2015 12:28 PM    To: Josue Hector, MD  Subject: RE: Medication Management             Dr. [name],   Thank you for your response.   Regarding the isordil, while the isosorbide dinitrate taken three times daily is the form of isosorbide studied to demonstrate mortality benefit, I am wondering if you might instead consider using isosorbide mononitrate extended release in this patient, as this is the form covered as a tier 1 generic on the patient's insurance. The patient is currently having difficulty affording his medications.   Regarding the conversion of the labetalol 300 mg twice daily to carvedilol, I did want to confirm the dose of 6.25 mg twice daily, as this would be a relative beta blocker dose reduction.   Thank you again.   [name], PharmD  Clinical Pharmacist  [facility]  [phone]    Dr. [name] responded:   From: Collier Salina  Tommy Rainwater, MD   Sent: 01/02/2015  2:13 PM    To: Vella Raring, RPH  Subject: RE: Medication Management             That's fine Again thanks for thinking about his his care so much    Forwarded to my nurse to make nitrate  Imdur 30 mg daily instead of isordil 10 tid    I will follow up with patient's pharmacy to make sure that this medication change was received and then with the patient to make sure that he is  aware of the changes.  [name], PharmD Clinical Pharmacist [place] [facility] [phone]

## 2015-01-05 ENCOUNTER — Other Ambulatory Visit: Payer: Self-pay | Admitting: *Deleted

## 2015-01-05 ENCOUNTER — Encounter (HOSPITAL_COMMUNITY): Payer: Self-pay

## 2015-01-05 ENCOUNTER — Other Ambulatory Visit: Payer: Self-pay | Admitting: Pharmacist

## 2015-01-05 ENCOUNTER — Ambulatory Visit (HOSPITAL_COMMUNITY)
Admission: RE | Admit: 2015-01-05 | Discharge: 2015-01-05 | Disposition: A | Discharge status: Home / Self Care | Payer: PPO | Source: Ambulatory Visit | Attending: Cardiology | Admitting: Cardiology

## 2015-01-05 VITALS — BP 158/84 | HR 86 | Wt 206.8 lb

## 2015-01-05 DIAGNOSIS — E1140 Type 2 diabetes mellitus with diabetic neuropathy, unspecified: Secondary | ICD-10-CM | POA: Insufficient documentation

## 2015-01-05 DIAGNOSIS — I5022 Chronic systolic (congestive) heart failure: Secondary | ICD-10-CM | POA: Diagnosis not present

## 2015-01-05 DIAGNOSIS — I429 Cardiomyopathy, unspecified: Secondary | ICD-10-CM | POA: Insufficient documentation

## 2015-01-05 DIAGNOSIS — N183 Chronic kidney disease, stage 3 (moderate): Secondary | ICD-10-CM | POA: Diagnosis not present

## 2015-01-05 DIAGNOSIS — I10 Essential (primary) hypertension: Secondary | ICD-10-CM

## 2015-01-05 DIAGNOSIS — D649 Anemia, unspecified: Secondary | ICD-10-CM | POA: Insufficient documentation

## 2015-01-05 DIAGNOSIS — I5042 Chronic combined systolic (congestive) and diastolic (congestive) heart failure: Secondary | ICD-10-CM | POA: Diagnosis not present

## 2015-01-05 DIAGNOSIS — Z79899 Other long term (current) drug therapy: Secondary | ICD-10-CM | POA: Diagnosis not present

## 2015-01-05 DIAGNOSIS — I252 Old myocardial infarction: Secondary | ICD-10-CM | POA: Diagnosis not present

## 2015-01-05 DIAGNOSIS — I129 Hypertensive chronic kidney disease with stage 1 through stage 4 chronic kidney disease, or unspecified chronic kidney disease: Secondary | ICD-10-CM | POA: Insufficient documentation

## 2015-01-05 DIAGNOSIS — Z8673 Personal history of transient ischemic attack (TIA), and cerebral infarction without residual deficits: Secondary | ICD-10-CM | POA: Insufficient documentation

## 2015-01-05 DIAGNOSIS — Z7902 Long term (current) use of antithrombotics/antiplatelets: Secondary | ICD-10-CM | POA: Diagnosis not present

## 2015-01-05 DIAGNOSIS — Z794 Long term (current) use of insulin: Secondary | ICD-10-CM | POA: Diagnosis not present

## 2015-01-05 DIAGNOSIS — E785 Hyperlipidemia, unspecified: Secondary | ICD-10-CM | POA: Insufficient documentation

## 2015-01-05 MED ORDER — CARVEDILOL 12.5 MG PO TABS: 12.5000 mg | ORAL_TABLET | Freq: Two times a day (BID) | ORAL | Status: DC

## 2015-01-05 MED ORDER — ISOSORBIDE MONONITRATE ER 60 MG PO TB24: 60.0000 mg | ORAL_TABLET | Freq: Every day | ORAL | Status: DC

## 2015-01-05 NOTE — Patient Outreach (Addendum)
Called to follow up with Charles Hart about how he is doing with the medication changes that were made by his cardiologist last Friday. Patient reports that he is doing well and had no trouble getting or taking the medications. Patient to be seen at Heart Failure Clinic this afternoon.   Patient does report that he has had some on-going diarrhea since Friday. This is prior to patient's medication changes, new medications started on Saturday. Advised patient to contact his PCP. Patient reports that he will speak with his PCP when he goes for his appointment afternoon as his PCP's office is in the same place.  Followed up with patient about his use of his new medbox and phone alarm to improve adherence to his mid-day dose of hydralazine. Patient reports that he has been using both and reports no missed doses   Patient has no further medication questions at this time. Have asked patient to follow up with me once he receives a response for social security about his extra help application. If have not heard from patient by 02/02/15, will follow up with him at that time.   [name], PharmD Clinical Pharmacist [facility] [facility] [phone]

## 2015-01-05 NOTE — Patient Instructions (Signed)
INCREASE Imdur to 60 mg daily INCREASE Coreg to 12.5 mg , one tab twice a day  You have been referred to [facility]/Hematology    Your physician recommends that you schedule a follow-up appointment in: 4 weeks  Do the following things EVERYDAY: 1) Weigh yourself in the morning before breakfast. Write it down and keep it in a log. 2) Take your medicines as prescribed 3) Eat low salt foods-Limit salt (sodium) to 2000 mg per day.  4) Stay as active as you can everyday 5) Limit all fluids for the day to less than 2 liters 6)

## 2015-01-05 NOTE — Progress Notes (Signed)
Patient ID: Charles Hart, male   DOB: 11-03-1955, 59 y.o.   MRN: [id number] PCP:Dr Hoffman  Primary Cardiologist: Dr [name]  Nephrology: Dr Lorrene Reid [facility]   HPI: [name] is a 59 year old  AA with poorly controlled HTN, CVA 2013 on plavix,  Hyperlipidemia, MI , heart caths 2004 and 2008 with normal cors, and DM complicated by CRF. History of CHF with depressed EF in the past, but ECHO  August 2014 showed recovery with EF 55-60% and Grade II DD but in April 2016 EF was back down to 25-30%.  He is not on ace due to allergy and CKD.    Admitted the end of April 2016 with HTN urgency and worsening renal function. He was on NTG drip short term. Creatinine 2.46 most recently but as high as 5 on admission.    He was referred to the HF clinic by Dr [name] for newly reduced EF and worsening renal failure. Today he is feeling pretty good. Denies SOB/PND/Orthopnea. Not weighing at home.  Has a scale. Following low diet. Drinking > 2 liters. In the past he would stop taking medications due to cost.   Labs 12/08/2014: K 3.8 Creatinine 2.46 hgb 7.1  SH: Wife helps him at home. 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 Does not smoke or drink alcohol.   FH:- Sister and brother heart attacks.   ROS: All systems negative except as listed in HPI, PMH and Problem List.  SH:  History   Social History  . Marital Status: Married    Spouse Name: N/A  . Number of Children: N/A  . Years of Education: N/A   Occupational History  . Not on file.   Social History Main Topics  . Smoking status: Never Smoker   . Smokeless tobacco: Never Used  . Alcohol Use: No     Comment: Wine occasionally (no more than 2 glasses per month)  . Drug Use: No  . Sexual Activity: Not Currently   Other Topics Concern  . Not on file   Social History Narrative    FH:  Family History  Problem Relation Age of Onset  . Colon cancer Sister   . Aneurysm Father 67    died of rupture    Past Medical History  Diagnosis Date   . Dermatitis   . CHF (congestive heart failure)     LV function improved from 2004 to 2008.  Historically, moderately dilated LV with EF 30-40% by 2D echo 08/14/2002.  Mild CAD with severe LV dysfunction by cardiac cath 09/2002.  Normal coronary arteries and normal LV function by cardiac cath 09/19/2006.  A 2-D echo on 04/01/2009 showed mild concentric hypertrophy and normal systolic (LVEF  123456) and doppler C/W with grade 1 diastolic dysfunction..   . Hypertension   . Hyperlipidemia   . Hearing loss in right ear   . Cardiomyopathy     LV function improved from 2004 to 2008.  Historically, moderately dilated LV with EF 30-40% by 2D echo 08/14/2002.  Mild CAD with severe LV dysfunction by cardiac cath 09/2002.  Normal coronary arteries and normal LV function by cardiac cath 09/19/2006.  A 2-D echo on 04/01/2009 showed mild concentric hypertrophy and normal systolic (LVEF  123456) and doppler C/W with grade 1 diastolic dysfunction.  . DM neuropathy, painful   . CVA (cerebral vascular accident) 07/04/2012    MRI of the brain 07/04/2012 showed an acute infarct in the right basal ganglia involving the anterior putamen,  anterior limb internal capsule, and head of the caudate; this measured approximately 2.5 cm in diameter.     . Hypertensive crisis 07/28/2012  . Myocardial infarction   . Diabetes mellitus     type 2  . Hypertensive urgency 08/20/2014  . Nephrotic syndrome 02/18/2013    A 24-hour urine collection 03/04/2013 showed total protein of 5,460 g and creatinine clearance of 80 mL/minute.  Patient was seen by Seward Meth at [facility] and a repeat 24-hour urine showed 10,407 mg protein.  Patient underwent kidney biopsy on 05/30/2013; pathology showed advanced diffuse and nodular diabetic nephropathy with vascular changes consistent with long-standing difficult to control hypertension.     . Adenomatous colon polyp 07/02/2011    Last colonoscopy May 06, 2011 by Dr.  Owens Loffler, who recommended repeat colonoscopy in 5 years.   [name] DIABETIC PERIPHERAL NEUROPATHY 08/03/2007    Qualifier: Diagnosis of  By: Charles Hart, Charles Hart    . Background diabetic retinopathy 04/20/2012    Patient is followed by Dr. Katy Fitch   . Chronic combined systolic and diastolic congestive heart failure 05/21/2010    LV function improved from 2004 to 2008.  Historically, moderately dilated LV with EF 30-40% by 2D echo 08/14/2002.  Mild CAD with severe LV dysfunction by cardiac cath 09/2002.  Normal coronary arteries and normal LV function by cardiac cath 09/19/2006.  A 2-D echo on 04/01/2009 showed mild concentric hypertrophy and normal systolic (LVEF  123456) and doppler parameters consistent with abnormal left     Current Outpatient Prescriptions  Medication Sig Dispense Refill  . ACCU-CHEK FASTCLIX LANCETS MISC Use to check blood sugar as directed 3 times a day. Dx code E11.65, on insulin. 102 each 2  . ACCU-CHEK SMARTVIEW test strip CHECK BLOOD SUGAR LEVELS UP TO THREE TIMES DAILY AS DIRECTED 100 each 5  . acetaminophen (TYLENOL) 500 MG tablet Take 500 mg by mouth every 6 (six) hours as needed (pain).     . calcium-vitamin D (OSCAL WITH D) 500-200 MG-UNIT per tablet Take 1 tablet by mouth 2 (two) times daily. (Patient not taking: Reported on 12/25/2014) 60 tablet 0  . carvedilol (COREG) 6.25 MG tablet Take 1 tablet (6.25 mg total) by mouth 2 (two) times daily. 60 tablet 6  . cloNIDine (CATAPRES) 0.3 MG tablet Take 1 tablet (0.3 mg total) by mouth 2 (two) times daily. 180 tablet 1  . clopidogrel (PLAVIX) 75 MG tablet Take 1 tablet (75 mg total) by mouth daily. 90 tablet 3  . fluticasone (FLONASE) 50 MCG/ACT nasal spray Place 2 sprays into both nostrils daily as needed for allergies or rhinitis.    . furosemide (LASIX) 40 MG tablet TAKE 1 TABLET BY MOUTH TWICE DAILY 60 tablet 0  . gabapentin (NEURONTIN) 300 MG capsule Take 1 capsule (300 mg total) by mouth every morning. (Patient taking  differently: Take 300 mg by mouth at bedtime. ) 90 capsule 1  . hydrALAZINE (APRESOLINE) 50 MG tablet Take 100 mg by mouth 3 (three) times daily.    . Insulin Pen Needle 31G X 4 MM MISC 1 Units by Does not apply route 4 (four) times daily. 100 each 11  . isosorbide mononitrate (IMDUR) 30 MG 24 hr tablet Take 1 tablet (30 mg total) by mouth daily. 90 tablet 3  . LANTUS SOLOSTAR 100 UNIT/ML Solostar Pen Inject 30 Units into the skin daily.   0  . Multiple Vitamin (MULITIVITAMIN WITH MINERALS) TABS Take 1 tablet by mouth every morning.     [name]  NOVOLOG FLEXPEN 100 UNIT/ML FlexPen Inject 2-7 Units into the skin daily.   9  . pravastatin (PRAVACHOL) 40 MG tablet Take 1 tablet (40 mg total) by mouth daily. 90 tablet 3   No current facility-administered medications for this encounter.    Filed Vitals:   01/05/15 1437  BP: 158/84  Pulse: 86  Weight: 206 lb 12.8 oz (93.804 kg)  SpO2: 96%    PHYSICAL EXAM:  General:  Well appearing. No resp difficulty HEENT: normal Neck: supple. JVP flat. Carotids 2+ bilaterally; no bruits. No lymphadenopathy or thryomegaly appreciated. Cor: PMI normal. Regular rate & rhythm. No rubs or murmurs.  +S4.  Lungs: clear Abdomen: soft, nontender, nondistended. No hepatosplenomegaly. No bruits or masses. Good bowel sounds. Extremities: no cyanosis, clubbing, rash, edema Neuro: alert & orientedx3, cranial nerves grossly intact. Moves all 4 extremities w/o difficulty. Affect pleasant.  ASSESSMENT & PLAN:  1. Combined Systolic Heart Failure: Low EF in the past, thought to be due to poorly controlled BP.  LHC in 2004 and 2008 with no significant CAD.  Recovery of function by 2014 with EF up to 55-60%.  However, in 4/16, EF back down to 25-30%.  He had been off his BP meds at that time. Nonischemic cardiomyopathy.  NYHA class II symptoms currently, not volume overloaded.   - Continue current diuretic regimen, Lasix 40 mg bid.  - Increase carvedilol 12.5 mg twice a day -  Continue current dose of hydralazine. Increase Imdur to 60 mg daily. - Plan to repeat ECHO in August /Sept to look for functional recovery.  Hopefully, EF will improve with BP control (it did so in the past).  2. CKD Stage III: Followed by Dr Lorrene Reid . Not on ACEI.   3. HTN: BP remains elevated. Increase carvedilol and Imdur as above.  4. Anemia:  Last Hgb 7.1 on April 25th. Had colonoscopy a few years ago. Looking back, hemoglobin has been stably low.  ?anemia of renal disease.  Refer to hematology => may benefit from erythropoeitin.  5. CVA: on plavix , bb, and statin  Follow up in 4 weeks and continue to increase BB.   Darrick Grinder, NP 01/05/2015  Patient seen with NP, agree with the above note.  Patient developed recurrence of cardiomyopathy in the setting of uncontrolled BP off his meds.  Meds restarted, titrating up.  Hopefully, with BP control, EF will improve.  This was the pattern in the past.  As above, today will increase Coreg to 12.5 mg bid and Imdur to 60 daily. Refer to hematology with persistently low hemoglobin, ?anemia of renal disease.  Loralie Champagne 01/06/2015

## 2015-01-06 ENCOUNTER — Encounter: Payer: Self-pay | Admitting: *Deleted

## 2015-01-06 ENCOUNTER — Other Ambulatory Visit (HOSPITAL_COMMUNITY): Payer: Self-pay | Admitting: *Deleted

## 2015-01-06 NOTE — Patient Outreach (Signed)
[facility]) Care Management  [facility] Social Work  01/06/2015  Charles Hart Jun 23, 1956 [name]  Subjective:    Patient does not have Advanced Directives ([place] documents in place.  Current Medications:  Current Outpatient Prescriptions  Medication Sig Dispense Refill  . ACCU-CHEK FASTCLIX LANCETS MISC Use to check blood sugar as directed 3 times a day. Dx code E11.65, on insulin. 102 each 2  . ACCU-CHEK SMARTVIEW test strip CHECK BLOOD SUGAR LEVELS UP TO THREE TIMES DAILY AS DIRECTED 100 each 5  . acetaminophen (TYLENOL) 500 MG tablet Take 500 mg by mouth every 6 (six) hours as needed (pain).     . calcium-vitamin D (OSCAL WITH D) 500-200 MG-UNIT per tablet Take 1 tablet by mouth 2 (two) times daily. (Patient not taking: Reported on 01/05/2015) 60 tablet 0  . carvedilol (COREG) 12.5 MG tablet Take 1 tablet (12.5 mg total) by mouth 2 (two) times daily. 60 tablet 6  . cloNIDine (CATAPRES) 0.3 MG tablet Take 1 tablet (0.3 mg total) by mouth 2 (two) times daily. 180 tablet 1  . clopidogrel (PLAVIX) 75 MG tablet Take 1 tablet (75 mg total) by mouth daily. 90 tablet 3  . fluticasone (FLONASE) 50 MCG/ACT nasal spray Place 2 sprays into both nostrils daily as needed for allergies or rhinitis.    . furosemide (LASIX) 40 MG tablet TAKE 1 TABLET BY MOUTH TWICE DAILY 60 tablet 0  . gabapentin (NEURONTIN) 300 MG capsule Take 1 capsule (300 mg total) by mouth every morning. (Patient taking differently: Take 300 mg by mouth at bedtime. ) 90 capsule 1  . hydrALAZINE (APRESOLINE) 50 MG tablet Take 100 mg by mouth 3 (three) times daily.    . Insulin Pen Needle 31G X 4 MM MISC 1 Units by Does not apply route 4 (four) times daily. 100 each 11  . isosorbide mononitrate (IMDUR) 60 MG 24 hr tablet Take 1 tablet (60 mg total) by mouth daily. 30 tablet 3  . LANTUS SOLOSTAR 100 UNIT/ML Solostar Pen Inject 30 Units into the skin daily.   0  . Multiple  Vitamin (MULITIVITAMIN WITH MINERALS) TABS Take 1 tablet by mouth every morning.     [name] NOVOLOG FLEXPEN 100 UNIT/ML FlexPen Inject 2-7 Units into the skin daily.   9  . pravastatin (PRAVACHOL) 40 MG tablet Take 1 tablet (40 mg total) by mouth daily. 90 tablet 3   No current facility-administered medications for this visit.    Functional Status:  In your present state of health, do you have any difficulty performing the following activities: 12/25/2014 12/25/2014  Hearing? N Y  Vision? N N  Difficulty concentrating or making decisions? N N  Walking or climbing stairs? N N  Dressing or bathing? N N  Doing errands, shopping? N N  Preparing Food and eating ? N -  Using the Toilet? N -  In the past six months, have you accidently leaked urine? N -  Do you have problems with loss of bowel control? N -  Managing your Medications? N -  Managing your Finances? N -  Housekeeping or managing your Housekeeping? N -    Fall/Depression Screening:  PHQ 2/9 Scores 12/25/2014 12/25/2014 12/05/2014 12/03/2014 11/26/2014 11/04/2014 10/10/2014  PHQ - 2 Score 0 0 0 0 0 0 0    Assessment:   CSW was able to make contact with patient today to perform the initial phone assessment, as well as assess and assist with  social work needs and services.  CSW introduced self, explained role and types of services provided through [facility] CM).  CSW further explained to patient that CSW received a referral on patient from Charles Hart, Telephonic Nurse Case Manager, also with [place] then explained the reason for the call, indicating that CSW is aware that he and his wife, Charles Hart are both interested in completing their Advanced Directives ([place] documents).  Patient voiced understanding. CSW was able to obtain verbal consent from patient to converse with CSW, and also got patient to recite two HIPAA compliant identifiers, which included  his name and date of birth.  Patient indicated that his wife's last day at work will be on Friday, June 3rd, requesting to meet for the initial home visit, anytime after that date.  CSW and patient agreed to meet on Tuesday, June 7th at 10:00am, as patient reported that this is a convenient time for them.  CSW explained to patient that CSW will provide the Advanced Directives packets, as well as converse with he and Mrs. Anthes about any other concerns that they may have at present. CSW is aware that patient is currently not employed, staying at home during the day while Mrs. Hampson works, to care for 5-year-old twins and a 71 month old baby.  CSW is also aware that patient applied for Adult Medicaid with the [place] in September of 2015, but was denied based on their combined income and assets.  CSW agreed to revisit this subject with patient, if agreeable.  A pharmacy consult has already been initiated for patient to assist with medication assistance.  CSW will complete an FAF (Financial Assessment Form) with patient to determine whether or not he qualifies for any type of financial assistance through [facility] CM.  Plan:   CSW will meet with patient and patient's wife, Charles Hart for an initial home visit on Tuesday, June 7th at 10:00am to assist with completion of Advanced Directives ([place] documents), as well as assess and assist with any additional social work needs and services. CSW will fax a correspondence letter to patient's Primary Care Physician, Dr. Joni Hart to ensure that Dr. Heber Oceana is aware of CSW's involvement with patient.  Nat Christen, BSW, MSW, [facility] [place], [address], [place] [address] Di [email or web address] [phone]

## 2015-01-07 ENCOUNTER — Encounter (HOSPITAL_COMMUNITY)
Admission: RE | Admit: 2015-01-07 | Discharge: 2015-01-07 | Disposition: A | Discharge status: Home / Self Care | Payer: PPO | Source: Ambulatory Visit | Attending: Nephrology | Admitting: Nephrology

## 2015-01-07 DIAGNOSIS — D509 Iron deficiency anemia, unspecified: Secondary | ICD-10-CM | POA: Diagnosis not present

## 2015-01-07 MED ORDER — FERUMOXYTOL INJECTION 510 MG/17 ML
510.0000 mg | Freq: Once | INTRAVENOUS | Status: AC
  Administered 2015-01-07: 510 mg via INTRAVENOUS
  Filled 2015-01-07: qty 17

## 2015-01-07 NOTE — Discharge Instructions (Signed)

## 2015-01-09 ENCOUNTER — Other Ambulatory Visit: Payer: Self-pay | Admitting: *Deleted

## 2015-01-20 ENCOUNTER — Encounter: Payer: Self-pay | Admitting: *Deleted

## 2015-01-20 ENCOUNTER — Other Ambulatory Visit: Payer: Self-pay | Admitting: *Deleted

## 2015-01-20 NOTE — Patient Outreach (Signed)
[facility]) Care Management  [facility] Social Work  01/20/2015  Charles Hart Mar 13, 1956 [id number]    Current Medications:  Current Outpatient Prescriptions  Medication Sig Dispense Refill  . ACCU-CHEK FASTCLIX LANCETS MISC Use to check blood sugar as directed 3 times a day. Dx code E11.65, on insulin. 102 each 2  . ACCU-CHEK SMARTVIEW test strip CHECK BLOOD SUGAR LEVELS UP TO THREE TIMES DAILY AS DIRECTED 100 each 5  . acetaminophen (TYLENOL) 500 MG tablet Take 500 mg by mouth every 6 (six) hours as needed (pain).     . calcium-vitamin D (OSCAL WITH D) 500-200 MG-UNIT per tablet Take 1 tablet by mouth 2 (two) times daily. (Patient not taking: Reported on 01/05/2015) 60 tablet 0  . carvedilol (COREG) 12.5 MG tablet Take 1 tablet (12.5 mg total) by mouth 2 (two) times daily. 60 tablet 6  . cloNIDine (CATAPRES) 0.3 MG tablet Take 1 tablet (0.3 mg total) by mouth 2 (two) times daily. 180 tablet 1  . clopidogrel (PLAVIX) 75 MG tablet Take 1 tablet (75 mg total) by mouth daily. 90 tablet 3  . fluticasone (FLONASE) 50 MCG/ACT nasal spray Place 2 sprays into both nostrils daily as needed for allergies or rhinitis.    . furosemide (LASIX) 40 MG tablet TAKE 1 TABLET BY MOUTH TWICE DAILY 60 tablet 0  . gabapentin (NEURONTIN) 300 MG capsule Take 1 capsule (300 mg total) by mouth every morning. (Patient taking differently: Take 300 mg by mouth at bedtime. ) 90 capsule 1  . hydrALAZINE (APRESOLINE) 50 MG tablet Take 100 mg by mouth 3 (three) times daily.    . Insulin Pen Needle 31G X 4 MM MISC 1 Units by Does not apply route 4 (four) times daily. 100 each 11  . isosorbide mononitrate (IMDUR) 60 MG 24 hr tablet Take 1 tablet (60 mg total) by mouth daily. 30 tablet 3  . LANTUS SOLOSTAR 100 UNIT/ML Solostar Pen Inject 30 Units into the skin daily.   0  . Multiple Vitamin (MULITIVITAMIN WITH MINERALS) TABS Take 1 tablet by mouth every morning.     [name] NOVOLOG FLEXPEN 100 UNIT/ML FlexPen  Inject 2-7 Units into the skin daily.   9  . pravastatin (PRAVACHOL) 40 MG tablet Take 1 tablet (40 mg total) by mouth daily. 90 tablet 3   No current facility-administered medications for this visit.    Functional Status:  In your present state of health, do you have any difficulty performing the following activities: 01/20/2015 01/07/2015  Hearing? - N  Vision? - N  Difficulty concentrating or making decisions? - N  Walking or climbing stairs? - N  Dressing or bathing? - N  Doing errands, shopping? - Facilities manager and eating ? N -  Using the Toilet? N -  In the past six months, have you accidently leaked urine? N -  Do you have problems with loss of bowel control? N -  Managing your Medications? N -  Managing your Finances? N -  Housekeeping or managing your Housekeeping? N -    Fall/Depression Screening:  PHQ 2/9 Scores 01/06/2015 12/25/2014 12/25/2014 12/05/2014 12/03/2014 11/26/2014 11/04/2014  PHQ - 2 Score 0 0 0 0 0 0 0    Assessment:   CSW was able to meet with patient briefly today to perform the initial home visit.  Patient was in the process of walking out the door when CSW arrived.  Patient admitted that he had forgotten all about the appointment, and  was getting ready to drive to [facility] to pick up an odd job.  Patient apologized profusely, agreeing to meet with CSW since CSW had made the drive.  Patient was alone in the home, free of his wife, Charles Hart and three small grandchildren (twins aged 8 and 74-monthold baby), with whom currently reside with patient and Charles Hart  CSW noted that patient's home was clean, free of clutter and nicely decorated.  CSW also noted that patient and Mrs. RShackettlive in a nice home, with a Jaguar and newer SUV parked in the driveway.  Along the street was another vehicle, which patient referred to as his own.  CSW inquired as to whether or not patient was still interested in completing an Adult Medicaid application, through the  [place]  Patient denied, reporting that he had just applied in September of 2015 and was found ineligible.  Apparently, patient and [name] income exceeds the [place] 2015[place], in addition to them having too many assets.  CSW was able to ensure that all three of patient's grandchildren are currently receiving Medicaid coverage. CSW then provided patient with the two Advanced Directives packets that CSW was requesting to provide to patient and Charles Hart  CSW was able to briefly review the Living Will and [place] with patient.  CSW also provide patient with a list of community agencies and resources that will be able to notarize the documents for patient and Charles Hart free of charge.  CSW inquired as to whether or not [name] be interested in having CSW schedule another home visit with he and his wife to assist with completion of the documents.  Patient admitted that the information sounded pretty self-explanatory, believing that he and Mrs. RKlosinskiwould be able to complete the documents with ease.  CSW reminded patient that patient has CSW's contact information, encouraging patient to contact CSW directly if assistance is needed in the future, for Advanced Directives purposes, of otherwise.  Patient voiced understanding and was agreeable to this plan.  No additional social work needs have been identified at this time.  Plan:   CSW will perform a case closure on patient, as all goals of treatment have been met and no additional social work needs identified at this time. CSW will notify CMariann Hart Telephonic Nurse Case Manager with [facility],  of CSW's plans to close patient's case.   CSW will fax a correspondence letter to patient's Primary Care Physician, Dr. EJoni Reiningto ensure that Dr. HHeber Carolinais aware of CSW's involvement with  patient. CSW will submit a case closure request to LLurline Del Care Management Assistant with [facility], in the form of an In [employer]  Charles Hart BSW, MSW, [place] [id number] [email or web address] [phone]

## 2015-01-22 ENCOUNTER — Other Ambulatory Visit: Payer: Self-pay
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
## 2015-01-22 NOTE — Patient Outreach (Signed)
[facility]) Care Management  01/22/2015  Charles Hart 1956-08-03 [id number]  In-basket note sent to primary MD on 01/22/15:   Hi Dr. Heber Hart:   Please flag for further attention and assessment on patients next office visit.   Weight down 35.4 lbs over the past month. 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 Most recent admission 12/05/14 - 12/08/14. Patient states reason for admission was due to MD at the time changed his medications and within 3-4 days, patient started having acute symptoms.  Patient unable to state weight at discharge but states the following:  weight on 12/25/14 MD appt was 215.  weight on 12/08/2014 admission was 206 lb 14.4 oz] [Per Epic  Weight at home on 01/22/2015: 179.6 (down 35.4 pounds over the past month).  Scales in the home. Patient reported on 12/25/2014 contact call he has not been weighing daily but planned to start.  Patient has completed follow-up appt today 12/25/14. Next follow-up scheduled for 3 months (03/2015).  Patient confirms he is only weighing about twice a week and not logging.  RN CM praised patient for weight loss but assessed further due to 35.4 weight loss over the past month.  Patient denies any change in appetite or loss of appetite. Patient laughed and stated "I guess it is because my wife is not feeding me." Wife, Charles Hart is present by patient's side.   RN CM advised patient while weight management is important; it is also important to maintain healthy weight loss.  RN CM will notify Primary MD of this change to flag for further attention on next office visit.  RN CM instructed patient again that daily weights are important with disease management of CHF.   RN CM instructed that Heart Failure Clinic will expect patient to put his best  effort into daily weights and logging and will be impressed with patient's self management when he takes his log sheet to his appt's.  RN CM provided self management intervention to place log, calendar or notebook (anything that works) in bathroom, empty bladder after arising from bed in the mornings and weight. Advised that this will become 2nd nature after a while and patient will not have to even attempt to remember to perform this task.   Thanks,  Charles Morin, RN, BSN, [name], [place] Management Coordinator [phone] Office [phone] Direct [phone] Cell

## 2015-01-23 ENCOUNTER — Other Ambulatory Visit: Payer: Self-pay | Admitting: Pharmacist

## 2015-01-23 NOTE — Patient Outreach (Signed)
Called to follow up with Charles Hart about whether he has yet received a response from social security about his extra help application. Reports that he has not yet received a response.   Reports that he is doing well, feeling good. Reports that he is continuing to be seen by the heart failure clinic. Reports that he has no further questions for me at this time.  Asked Charles Hart to give me a call when he receives a response about the Extra Help application. If have not heard from patient by 02/02/15, will follow up with him at that time.   [name], PharmD Clinical Pharmacist [place] Management [phone]

## 2015-01-29 ENCOUNTER — Other Ambulatory Visit: Payer: Self-pay

## 2015-01-29 DIAGNOSIS — I5042 Chronic combined systolic (congestive) and diastolic (congestive) heart failure: Secondary | ICD-10-CM
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
## 2015-02-02 ENCOUNTER — Other Ambulatory Visit: Payer: Self-pay | Admitting: *Deleted

## 2015-02-02 ENCOUNTER — Other Ambulatory Visit: Payer: Self-pay

## 2015-02-02 ENCOUNTER — Encounter (HOSPITAL_COMMUNITY): Payer: PPO

## 2015-02-02 NOTE — Patient Outreach (Signed)
[place]) Care Management  02/02/2015  WAEL CLEMON 12-16-1955 [name]   Notification from [name], RN to assign Community RN, assigned Erenest Rasher, RN.  [name]. [place], [place] Management [place] Assistant Phone: [phone] Fax: [phone]

## 2015-02-03 MED ORDER — FUROSEMIDE 40 MG PO TABS: 40.0000 mg | ORAL_TABLET | Freq: Two times a day (BID) | ORAL | Status: DC

## 2015-02-03 NOTE — Patient Outreach (Addendum)
This RNCM was successful in making telephone contact with patient. Patient was agreeable to discuss his case management needs with patient at a later day this week. Patient stated he had something to do, would feel better like talking after he gets finished.  RNCM case management call made to assess for further case management needs as he was referred from telephonic case management.  Plan: Telephone contact to assess further case management needs on Friday, June 24.

## 2015-02-04 ENCOUNTER — Other Ambulatory Visit: Payer: Self-pay | Admitting: Pharmacist

## 2015-02-04 NOTE — Patient Outreach (Signed)
Received another call from Charles Hart who states that he spoke with social services and was informed by them that his disability check amount has decreased because he owns too many cars. Reports that he needs help understanding this situation, as he is now feeling like he will have to go back to work.  Suggested that Charles Hart speak with social worker Nat Christen who has previously met with him. Provided Charles Hart with her phone number.  Will follow up with Charles Hart once he has received a response about the Extra Help application from [employer].  [name], PharmD Clinical Pharmacist [place] Management [phone]

## 2015-02-04 NOTE — Patient Outreach (Signed)
After providing Charles Hart with contact information for social worker Charles Hart, found that she is currently out of the office. Contacted covering social worker Charles Hart. Relayed to Charles Hart that Charles Hart was concerned after he spoke with social services and was informed by them that his disability check amount has decreased because he owns too many cars.   Charles Hart stated that she would give the patient a call.   [name], PharmD Clinical Pharmacist [place] Management [phone]

## 2015-02-04 NOTE — Patient Outreach (Signed)
Called to follow up with Charles Hart about whether he has yet received a response from social security about his extra help application. Reports that he has not yet received a response.   Advised Charles Hart to give social security a call about this application, as I would have expected him to have received a response by now. Charles Hart reports that he no longer has the paper receipt of the application that we submitted, so I provide him with the assistance phone number from the social security website, [phone]. He said that he will give them a call and then call me back. If have not heard from patient by the end of the day, will follow up with him at that time.   [name], PharmD Clinical Pharmacist [place] Management [phone]

## 2015-02-04 NOTE — Patient Outreach (Signed)
Received a call back from Charles Hart. Reports that he called the social security office number and that he was informed that his application is still being processed. Reports that the representative said that they are waiting on information from the IRS before they can give him a response.  Charles Hart said that he will give me a call back when he gets that response. If I have not heard back from him by 02/18/15, will call him back at that time.  [name], PharmD Clinical Pharmacist [place] Management [phone]

## 2015-02-05 ENCOUNTER — Other Ambulatory Visit: Payer: Self-pay | Admitting: *Deleted

## 2015-02-05 ENCOUNTER — Other Ambulatory Visit: Payer: Self-pay

## 2015-02-05 NOTE — Patient Outreach (Signed)
[place] [facility]) Care Management  02/05/2015  ETHANALEXANDER WIDMARK February 28, 1956 [name]   Phone call to patient to follow up on questions that he might have regarding his medicaid coverage.  Per patient, his Medicaid application was denied because he did not send in the needed documents to re-certify in time.  Per patient, as the social services worker looked into is case a little bit more, there was a statement made that he may no qualify due to this amount of assets.  Per patient, he is not willing to spend down some of his assets in  order to qualify for full medicaid benefitse.  Patient, however plans to reapply for Medicaid.  It was suggested by this social worker that he consult legal counsel to obtain information regarding his assets before re-applying for medicaid.  Per patient, his wife has pre-paid legal services and will consider consulting them before re-applying for Medicaid.   Sheralyn Boatman [facility] Care Management [phone]

## 2015-02-05 NOTE — Patient Outreach (Signed)
[facility]) Care Management  02/05/2015  Charles Hart [birth year] [name]   Call made to reschedule today's initial assessment due to network difficulty and not being able to access patient's contact information.    Patient agreed to reschedule this visit until Tuesday, June 28 at 10am and to a brief assessment via telephone for community case management assessment.  Patient was referred to [place] Case Management for CHF, assessment of community needs.   Plan: Initial home visit on Tuesday, June 28 for assessment of community case management

## 2015-02-10 ENCOUNTER — Other Ambulatory Visit: Payer: Self-pay

## 2015-02-10 NOTE — Patient Outreach (Signed)
[place] [facility]) Care Management  02/10/2015  REI HABERKORN [birth year] [id number]      This RNCM arrived at patient's home for initial home visit for community care coordination. This RNCM ranged door bell but no answer.    Telephone contact with patient to reschedule home visit. Patient states he forgot about the appointment, requested a rescheduling of appointment. This RNCM and to home visit on Thursday, July 7 at [place]: Home visit on Thursday, July 7.

## 2015-02-12 ENCOUNTER — Encounter: Payer: PPO | Admitting: Internal Medicine

## 2015-02-12 ENCOUNTER — Encounter: Payer: Self-pay | Admitting: Internal Medicine

## 2015-02-18 ENCOUNTER — Other Ambulatory Visit: Payer: Self-pay | Admitting: Pharmacist

## 2015-02-18 NOTE — Patient Outreach (Signed)
Calling to follow up with Mr.  Charles Hart about the outcome of his Extra Help application. Left a HIPAA compliant message on the patient's voicemail. If have not heard from patient by 02/20/15, will give him another call at that time.  [name], PharmD Clinical Pharmacist [place] [facility] [phone]

## 2015-02-19 ENCOUNTER — Other Ambulatory Visit: Payer: Self-pay

## 2015-02-19 NOTE — Patient Outreach (Signed)
[facility]) Care Management  02/19/2015  [name] Aug 03, 1956 [name]

## 2015-02-20 NOTE — Patient Outreach (Addendum)
[place] [facility]) Care Management  February 19, 2015  Charles Hart [birth year] [name]   This RNCM contacted patient via telephone to remind him of today's home visit. Patient identified by satisying HIPPA identifiers.  This RNCM explained the purpose of the call was to remind him of the home visit scheduled for today. Patient stated he remembers and agreed to be available.  However, when this RNCM arrived at patient's home, ranged the door bell several times. This RNCM also called patient while standing on his front porch but did not get an answer.  This RNCM left her business card on door.  Plan: Call patient on Monday, February 23, 2015 to follow up

## 2015-02-23 ENCOUNTER — Ambulatory Visit: Payer: PPO

## 2015-02-23 ENCOUNTER — Other Ambulatory Visit: Payer: Self-pay | Admitting: Pharmacist

## 2015-02-23 NOTE — Patient Outreach (Signed)
Calling to follow up with [name] about the outcome of his Extra Help application. Call attempt #2. Left a HIPAA compliant message on the patient's voicemail. If have not heard from patient by 02/25/15, will give him another call at that time.  [name], PharmD Clinical Pharmacist [place] [facility] [phone]

## 2015-02-25 ENCOUNTER — Encounter: Payer: Self-pay | Admitting: Pharmacist

## 2015-02-25 ENCOUNTER — Other Ambulatory Visit: Payer: Self-pay | Admitting: Pharmacist

## 2015-02-25 NOTE — Patient Outreach (Signed)
Calling to follow up with Mr. [name] about the outcome of his Extra Help application. Call attempt #3. Left a HIPAA compliant message on the patient's voicemail.   I will send patient outreach letter to attempt contact. If I have not heard back from Mr. Chromy within 10 days, will close pharmacy episode.  [name], PharmD Clinical Pharmacist [place] [facility] [phone]

## 2015-03-02 ENCOUNTER — Other Ambulatory Visit: Payer: Self-pay | Admitting: Pharmacist

## 2015-03-02 NOTE — Patient Outreach (Signed)
Received a call from Charles Hart letting me know that he received a letter back about his Extra Help Application today. Reports that the letter states that he was approved and will receive extra help with his medication copays.  Patient reports that he has no further medication questions for me at this time. Let patient know that pharmacy will stop following him for now.  Charles Hart, PharmD Clinical Pharmacist [place] [facility] [phone]

## 2015-03-19 ENCOUNTER — Encounter: Payer: PPO | Admitting: Internal Medicine

## 2015-03-25 ENCOUNTER — Telehealth: Payer: Self-pay | Admitting: Internal Medicine

## 2015-03-25 NOTE — Telephone Encounter (Signed)
Call to patient to confirm appointment for 03/26/15 at 1:45 [facility]

## 2015-03-26 ENCOUNTER — Encounter: Payer: PPO | Admitting: Internal Medicine

## 2015-03-26 ENCOUNTER — Encounter: Payer: Self-pay | Admitting: Internal Medicine

## 2015-03-26 ENCOUNTER — Ambulatory Visit (INDEPENDENT_AMBULATORY_CARE_PROVIDER_SITE_OTHER): Payer: PPO | Admitting: Internal Medicine

## 2015-03-26 VITALS — BP 139/71 | HR 75 | Temp 97.5°F | Ht 69.0 in | Wt 222.6 lb

## 2015-03-26 DIAGNOSIS — E1122 Type 2 diabetes mellitus with diabetic chronic kidney disease: Secondary | ICD-10-CM | POA: Diagnosis not present

## 2015-03-26 DIAGNOSIS — E1129 Type 2 diabetes mellitus with other diabetic kidney complication: Secondary | ICD-10-CM | POA: Diagnosis not present

## 2015-03-26 DIAGNOSIS — IMO0002 Reserved for concepts with insufficient information to code with codable children: Secondary | ICD-10-CM

## 2015-03-26 DIAGNOSIS — I5042 Chronic combined systolic (congestive) and diastolic (congestive) heart failure: Secondary | ICD-10-CM | POA: Diagnosis not present

## 2015-03-26 DIAGNOSIS — I10 Essential (primary) hypertension: Secondary | ICD-10-CM

## 2015-03-26 DIAGNOSIS — N183 Chronic kidney disease, stage 3 unspecified: Secondary | ICD-10-CM

## 2015-03-26 DIAGNOSIS — E1165 Type 2 diabetes mellitus with hyperglycemia: Principal | ICD-10-CM

## 2015-03-26 LAB — GLUCOSE, CAPILLARY: GLUCOSE-CAPILLARY: 237 mg/dL — AB (ref 65–99)

## 2015-03-26 LAB — POCT GLYCOSYLATED HEMOGLOBIN (HGB A1C): Hemoglobin A1C: 5.9

## 2015-03-26 MED ORDER — INSULIN PEN NEEDLE 31G X 4 MM MISC: 1.0000 [IU] | Freq: Four times a day (QID) | Status: DC

## 2015-03-26 NOTE — Patient Instructions (Signed)
General Instructions:  Keep up the good work and see you cardiologist on Monday.  Thank you for bringing your medicines today. This helps [name] keep you safe from mistakes.   Progress Toward Treatment Goals:  Treatment Goal 03/26/2015  Hemoglobin A1C at goal  Blood pressure at goal    Self Care Goals & Plans:  Self Care Goal 03/26/2015  Manage my medications take my medicines as prescribed; bring my medications to every visit; refill my medications on time  Monitor my health keep track of my blood glucose; bring my glucose meter and log to each visit; keep track of my blood pressure; check my feet daily  Eat healthy foods drink diet soda or water instead of juice or soda; eat more vegetables; eat foods that are low in salt  Be physically active take a walk every day  Meeting treatment goals -    Home Blood Glucose Monitoring 03/26/2015  Check my blood sugar 2 times a day  When to check my blood sugar before breakfast; before dinner     Care Management & Community Referrals:  Referral 03/26/2015  Referrals made for care management support none needed  Referrals made to community resources -

## 2015-03-26 NOTE — Progress Notes (Signed)
[facility] Subjective:   Patient ID: Charles Hart male   DOB: 1955-12-07 59 y.o.   MRN: [name]  HPI: [name] is a 59 y.o. male with a PMH detailed below who presents for routine follow up     Past Medical History  Diagnosis Date  . Dermatitis   . CHF (congestive heart failure)     LV function improved from 2004 to 2008.  Historically, moderately dilated LV with EF 30-40% by 2D echo 08/14/2002.  Mild CAD with severe LV dysfunction by cardiac cath 09/2002.  Normal coronary arteries and normal LV function by cardiac cath 09/19/2006.  A 2-D echo on 04/01/2009 showed mild concentric hypertrophy and normal systolic (LVEF  123456) and doppler C/W with grade 1 diastolic dysfunction..   . Hypertension   . Hyperlipidemia   . Hearing loss in right ear   . Cardiomyopathy     LV function improved from 2004 to 2008.  Historically, moderately dilated LV with EF 30-40% by 2D echo 08/14/2002.  Mild CAD with severe LV dysfunction by cardiac cath 09/2002.  Normal coronary arteries and normal LV function by cardiac cath 09/19/2006.  A 2-D echo on 04/01/2009 showed mild concentric hypertrophy and normal systolic (LVEF  123456) and doppler C/W with grade 1 diastolic dysfunction.  . DM neuropathy, painful   . CVA (cerebral vascular accident) 07/04/2012    MRI of the brain 07/04/2012 showed an acute infarct in the right basal ganglia involving the anterior putamen, anterior limb internal capsule, and head of the caudate; this measured approximately 2.5 cm in diameter.     . Hypertensive crisis 07/28/2012  . Myocardial infarction   . Diabetes mellitus     type 2  . Hypertensive urgency 08/20/2014  . Nephrotic syndrome 02/18/2013    A 24-hour urine collection 03/04/2013 showed total protein of 5,460 g and creatinine clearance of 80 mL/minute.  Patient was seen by Seward Meth at [place] and a repeat 24-hour urine showed 10,407 mg protein.   Patient underwent kidney biopsy on 05/30/2013; pathology showed advanced diffuse and nodular diabetic nephropathy with vascular changes consistent with long-standing difficult to control hypertension.     . Adenomatous colon polyp 07/02/2011    Last colonoscopy May 06, 2011 by Dr. Owens Loffler, who recommended repeat colonoscopy in 5 years.   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 Background diabetic retinopathy 04/20/2012    Patient is followed by Dr. Katy Fitch   . Chronic combined systolic and diastolic congestive heart failure 05/21/2010    LV function improved from 2004 to 2008.  Historically, moderately dilated LV with EF 30-40% by 2D echo 08/14/2002.  Mild CAD with severe LV dysfunction by cardiac cath 09/2002.  Normal coronary arteries and normal LV function by cardiac cath 09/19/2006.  A 2-D echo on 04/01/2009 showed mild concentric hypertrophy and normal systolic (LVEF  123456) and doppler parameters consistent with abnormal left    Current Outpatient Prescriptions  Medication Sig Dispense Refill  . carvedilol (COREG) 12.5 MG tablet Take 1 tablet (12.5 mg total) by mouth 2 (two) times daily. 60 tablet 6  . cloNIDine (CATAPRES) 0.3 MG tablet Take 1 tablet (0.3 mg total) by mouth 2 (two) times daily. 180 tablet 1  . clopidogrel (PLAVIX) 75 MG tablet Take 1 tablet (75 mg total) by mouth daily. 90 tablet 3  . fluticasone (FLONASE) 50 MCG/ACT nasal  spray Place 2 sprays into both nostrils daily as needed for allergies or rhinitis.    . furosemide (LASIX) 40 MG tablet Take 1 tablet (40 mg total) by mouth 2 (two) times daily. 180 tablet 3  . gabapentin (NEURONTIN) 300 MG capsule Take 1 capsule (300 mg total) by mouth every morning. (Patient taking differently: Take 300 mg by mouth at bedtime. ) 90 capsule 1  . hydrALAZINE (APRESOLINE) 50 MG tablet Take 100 mg by mouth 3 (three) times daily.    . isosorbide mononitrate (IMDUR) 60 MG 24 hr  tablet Take 1 tablet (60 mg total) by mouth daily. 30 tablet 3  . LANTUS SOLOSTAR 100 UNIT/ML Solostar Pen Inject 30 Units into the skin daily.   0  . Multiple Vitamin (MULITIVITAMIN WITH MINERALS) TABS Take 1 tablet by mouth every morning.     [name] NOVOLOG FLEXPEN 100 UNIT/ML FlexPen Inject 2-7 Units into the skin daily.   9  . pravastatin (PRAVACHOL) 40 MG tablet Take 1 tablet (40 mg total) by mouth daily. 90 tablet 3  . ACCU-CHEK FASTCLIX LANCETS MISC Use to check blood sugar as directed 3 times a day. Dx code E11.65, on insulin. 102 each 2  . ACCU-CHEK SMARTVIEW test strip CHECK BLOOD SUGAR LEVELS UP TO THREE TIMES DAILY AS DIRECTED 100 each 5  . acetaminophen (TYLENOL) 500 MG tablet Take 500 mg by mouth every 6 (six) hours as needed (pain).     . calcium-vitamin D (OSCAL WITH D) 500-200 MG-UNIT per tablet Take 1 tablet by mouth 2 (two) times daily. (Patient not taking: Reported on 01/05/2015) 60 tablet 0  . Insulin Pen Needle 31G X 4 MM MISC 1 Units by Does not apply route 4 (four) times daily. 100 each 11   No current facility-administered medications for this visit.   Family History  Problem Relation Age of Onset  . Colon cancer Sister   . Aneurysm Father 18    died of rupture   Social History   Social History  . Marital Status: Married    Spouse Name: N/A  . Number of Children: N/A  . Years of Education: N/A   Social History Main Topics  . Smoking status: Never Smoker   . Smokeless tobacco: Never Used  . Alcohol Use: No     Comment: Wine occasionally (no more than 2 glasses per month)  . Drug Use: No  . Sexual Activity: Not Currently   Other Topics Concern  . None   Social History Narrative   Review of Systems: Review of Systems  Constitutional: Negative for fever, chills, weight loss and malaise/fatigue.  Eyes: Negative for blurred vision.  Respiratory: Negative for cough.   Cardiovascular: Negative for chest pain.  Gastrointestinal: Negative for heartburn and  abdominal pain.  Genitourinary: Negative for dysuria.  Musculoskeletal: Negative for myalgias.  Neurological: Negative for dizziness and headaches.  Psychiatric/Behavioral: Negative for depression, suicidal ideas and substance abuse.     Objective:  Physical Exam: Filed Vitals:   03/26/15 1411 03/26/15 1447  BP: 161/73 139/71  Pulse: 79 75  Temp: 97.5 F (36.4 C)   TempSrc: Oral   Height: 5\' 9"  (1.753 m)   Weight: 222 lb 9.6 oz (100.971 kg)   SpO2: 79% 100%  Suspect Vitals may not be accurate, I repeated them myself and were notable for O2 sat of 100% and BP of 139/71 Physical Exam  Constitutional: He is oriented to person, place, and time and well-developed, well-nourished, and in no distress.  HENT:  Head: Normocephalic and atraumatic.  Eyes: EOM are normal.  Neck: Normal range of motion. Neck supple.  Cardiovascular: Normal rate and regular rhythm.   Pulmonary/Chest: Effort normal and breath sounds normal. He has no wheezes. He has no rales.  Abdominal: Soft. Bowel sounds are normal.  Musculoskeletal: He exhibits edema (trace bilateral).  Neurological: He is alert and oriented to person, place, and time.  Skin: Skin is warm and dry.  Psychiatric: Mood and affect normal.  Nursing note and vitals reviewed.   Wt Readings from Last 5 Encounters:  03/26/15 222 lb 9.6 oz (100.971 kg)  01/07/15 206 lb (93.441 kg)  01/05/15 206 lb 12.8 oz (93.804 kg)  12/25/14 215 lb 11.2 oz (97.841 kg)  12/24/14 213 lb 6.4 oz (96.798 kg)    Assessment & Plan:  Case discussed with Dr. Daryll Drown  Essential hypertension Bp well controlled on recheck. Continue IMDUR 60, Hydral 100 TID, Clonidine 0.3 BID and Coreg 12.5 Bid  Chronic combined systolic and diastolic congestive heart failure His weight has increased since his last visit to 222.  However his O2 sat is 100%, lungs are CTA and has minimal pedal edema.  He reports that he is very compliant with his medications and is taking lasix 40mg  2  or 3 times a day depending on his edema.  He feels very well and reports he is even doing work and does not get SOB.  He notes he has follow up with the CHF clinic on Monday and has no active complaints. -Will continue his current regimen.  DM (diabetes mellitus), type 2, uncontrolled, with renal complications Lab Results  Component Value Date   HGBA1C 5.9 03/26/2015   HGBA1C 7.7 11/04/2014   HGBA1C 6.4 09/02/2014    He reports he is taking Lantus 30 units a day,  For his novolog he notes he is usually taking 7 units TID with meals.  He denies any hypoglycemic episodes.  He does bring his meter but unfourtnately it only shows 9 tests within the last month.  Those tests range between 176 and 116 which is great.  Assessment: Diabetes control: good control (HgbA1C at goal) Progress toward A1C goal:  at goal Comments: His A1c has improved to 5.9 which may be too well controlled  Plan: Medications:  continue current medications Home glucose monitoring: Frequency: 2 times a day Timing: before breakfast, before dinner Instruction/counseling given: discussed foot care and discussed diet Educational resources provided:  (REFUSED) Self management tools provided: copy of home glucose meter download Other plans: I asked that at a minimum he check his sugars once a day, if possible check twice.  He will bring in his meter at the next visit.     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  SCr appears stable at the boarder of CKD 3-4    Medications Ordered Meds ordered this encounter  Medications  . Insulin Pen Needle 31G X 4 MM MISC    Sig: 1 Units by Does not apply route 4 (four) times daily.    Dispense:  100 each    Refill:  11    The patient is insulin requiring, ICD 10 code E11.29. The patient injects insulin 4 times per day.   Other Orders Orders Placed This Encounter  Procedures  . Glucose,  capillary  . POCT glycosylated hemoglobin (Hb A1C)   Follow Up: Return in about 3  months (around 06/26/2015).

## 2015-03-28 NOTE — Assessment & Plan Note (Signed)
He is followed closely with nephrology,  Reviewed renal note and recent labs.  SCr appears stable at the boarder of CKD 3-4

## 2015-03-28 NOTE — Assessment & Plan Note (Signed)
Bp well controlled on recheck. Continue IMDUR 60, Hydral 100 TID, Clonidine 0.3 BID and Coreg 12.5 Bid

## 2015-03-28 NOTE — Assessment & Plan Note (Signed)
His weight has increased since his last visit to 222.  However his O2 sat is 100%, lungs are CTA and has minimal pedal edema.  He reports that he is very compliant with his medications and is taking lasix 40mg  2 or 3 times a day depending on his edema.  He feels very well and reports he is even doing work and does not get SOB.  He notes he has follow up with the CHF clinic on Monday and has no active complaints. -Will continue his current regimen.

## 2015-03-28 NOTE — Assessment & Plan Note (Signed)
Lab Results  Component Value Date   HGBA1C 5.9 03/26/2015   HGBA1C 7.7 11/04/2014   HGBA1C 6.4 09/02/2014    He reports he is taking Lantus 30 units a day,  For his novolog he notes he is usually taking 7 units TID with meals.  He denies any hypoglycemic episodes.  He does bring his meter but unfourtnately it only shows 9 tests within the last month.  Those tests range between 176 and 116 which is great.  Assessment: Diabetes control: good control (HgbA1C at goal) Progress toward A1C goal:  at goal Comments: His A1c has improved to 5.9 which may be too well controlled  Plan: Medications:  continue current medications Home glucose monitoring: Frequency: 2 times a day Timing: before breakfast, before dinner Instruction/counseling given: discussed foot care and discussed diet Educational resources provided:  (REFUSED) Self management tools provided: copy of home glucose meter download Other plans: I asked that at a minimum he check his sugars once a day, if possible check twice.  He will bring in his meter at the next visit.

## 2015-03-30 ENCOUNTER — Ambulatory Visit (INDEPENDENT_AMBULATORY_CARE_PROVIDER_SITE_OTHER): Payer: PPO | Admitting: Cardiovascular Disease

## 2015-03-30 ENCOUNTER — Encounter: Payer: Self-pay | Admitting: Cardiovascular Disease

## 2015-03-30 VITALS — BP 140/88 | HR 77 | Ht 69.0 in | Wt 222.1 lb

## 2015-03-30 DIAGNOSIS — I420 Dilated cardiomyopathy: Secondary | ICD-10-CM | POA: Diagnosis not present

## 2015-03-30 NOTE — Patient Instructions (Addendum)
Medication Instructions:  NO CHANGES  Labwork: NONE  Testing/Procedures: Your physician has requested that you have an echocardiogram. Echocardiography is a painless test that uses sound waves to create images of your heart. It provides your doctor with information about the size and shape of your heart and how well your heart's chambers and valves are working. This procedure takes approximately one hour. There are no restrictions for this procedure.  END OF  MONTH   Follow-Up: Your physician wants you to follow-up in:    4 MONTHS  WITH  DR [name]     1  MONTH   WITH  CHF  CLINIC   MAKE  SURE  ECHO  IS  DONE  BEFORE You will receive a reminder letter in the mail two months in advance. If you don't receive a letter, please call our office to schedule the follow-up appointment.  Any Other Special Instructions Will Be Listed Below (If Applicable).

## 2015-03-30 NOTE — Progress Notes (Signed)
Internal Medicine Clinic Attending  Case discussed with Dr. Hoffman soon after the resident saw the patient.  We reviewed the resident's history and exam and pertinent patient test results.  I agree with the assessment, diagnosis, and plan of care documented in the resident's note. 

## 2015-03-30 NOTE — Progress Notes (Signed)
Patient ID: Charles Hart, male   DOB: October 31, 1955, 59 y.o.   MRN: [name] PCP:Dr Hoffman  Primary Cardiologist: Dr [name]  Nephrology: Dr Lorrene Reid [facility]   HPI: Charles Hart is a 59 year old  AA with poorly controlled HTN, CVA 2013 on plavix,  Hyperlipidemia, MI , heart caths 2004 and 2008 with normal cors, and DM complicated by CRF. History of CHF with depressed EF in the past, but ECHO  August 2014 showed recovery with EF 55-60% and Grade II DD but in April 2016 EF was back down to 25-30%.  He is not on ace due to allergy and CKD.    Admitted the end of April 2016 with HTN urgency and worsening renal function. He was on NTG drip short term. Creatinine 2.46 most recently but as high as 5 on admission.    Last seen by CHF clinic in May  Today he is feeling pretty good. Denies SOB/PND/Orthopnea. Not weighing at home.  Has a scale. Following low diet. Drinking > 2 liters. In the past he would stop taking medications due to cost.   Labs 12/08/2014: K 3.8 Creatinine 2.46 hgb 7.1  SH: Wife helps him at home. 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 Does not smoke or drink alcohol.   FH:- Sister and brother heart attacks.   ROS: All systems negative except as listed in HPI, PMH and Problem List.  SH:  Social History   Social History  . Marital Status: Married    Spouse Name: N/A  . Number of Children: N/A  . Years of Education: N/A   Occupational History  . Not on file.   Social History Main Topics  . Smoking status: Never Smoker   . Smokeless tobacco: Never Used  . Alcohol Use: No     Comment: Wine occasionally (no more than 2 glasses per month)  . Drug Use: No  . Sexual Activity: Not Currently   Other Topics Concern  . Not on file   Social History Narrative    FH:  Family History  Problem Relation Age of Onset  . Colon cancer Sister   . Aneurysm Father 60    died of rupture    Past Medical History  Diagnosis Date  . Dermatitis   . CHF (congestive heart failure)    LV function improved from 2004 to 2008.  Historically, moderately dilated LV with EF 30-40% by 2D echo 08/14/2002.  Mild CAD with severe LV dysfunction by cardiac cath 09/2002.  Normal coronary arteries and normal LV function by cardiac cath 09/19/2006.  A 2-D echo on 04/01/2009 showed mild concentric hypertrophy and normal systolic (LVEF  123456) and doppler C/W with grade 1 diastolic dysfunction..   . Hypertension   . Hyperlipidemia   . Hearing loss in right ear   . Cardiomyopathy     LV function improved from 2004 to 2008.  Historically, moderately dilated LV with EF 30-40% by 2D echo 08/14/2002.  Mild CAD with severe LV dysfunction by cardiac cath 09/2002.  Normal coronary arteries and normal LV function by cardiac cath 09/19/2006.  A 2-D echo on 04/01/2009 showed mild concentric hypertrophy and normal systolic (LVEF  123456) and doppler C/W with grade 1 diastolic dysfunction.  . DM neuropathy, painful   . CVA (cerebral vascular accident) 07/04/2012    MRI of the brain 07/04/2012 showed an acute infarct in the right basal ganglia involving the anterior putamen, anterior limb internal capsule, and head of the caudate; this measured  approximately 2.5 cm in diameter.     . Hypertensive crisis 07/28/2012  . Myocardial infarction   . Diabetes mellitus     type 2  . Hypertensive urgency 08/20/2014  . Nephrotic syndrome 02/18/2013    A 24-hour urine collection 03/04/2013 showed total protein of 5,460 g and creatinine clearance of 80 mL/minute.  Patient was seen by Seward Meth at [facility] and a repeat 24-hour urine showed 10,407 mg protein.  Patient underwent kidney biopsy on 05/30/2013; pathology showed advanced diffuse and nodular diabetic nephropathy with vascular changes consistent with long-standing difficult to control hypertension.     . Adenomatous colon polyp 07/02/2011    Last colonoscopy May 06, 2011 by Dr. Owens Loffler, who recommended repeat colonoscopy in 5  years.   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 Background diabetic retinopathy 04/20/2012    Patient is followed by Dr. Katy Fitch   . Chronic combined systolic and diastolic congestive heart failure 05/21/2010    LV function improved from 2004 to 2008.  Historically, moderately dilated LV with EF 30-40% by 2D echo 08/14/2002.  Mild CAD with severe LV dysfunction by cardiac cath 09/2002.  Normal coronary arteries and normal LV function by cardiac cath 09/19/2006.  A 2-D echo on 04/01/2009 showed mild concentric hypertrophy and normal systolic (LVEF  123456) and doppler parameters consistent with abnormal left     Current Outpatient Prescriptions  Medication Sig Dispense Refill  . ACCU-CHEK FASTCLIX LANCETS MISC Use to check blood sugar as directed 3 times a day. Dx code E11.65, on insulin. 102 each 2  . ACCU-CHEK SMARTVIEW test strip CHECK BLOOD SUGAR LEVELS UP TO THREE TIMES DAILY AS DIRECTED 100 each 5  . acetaminophen (TYLENOL) 500 MG tablet Take 500 mg by mouth every 6 (six) hours as needed (pain).     . carvedilol (COREG) 12.5 MG tablet Take 1 tablet (12.5 mg total) by mouth 2 (two) times daily. 60 tablet 6  . cloNIDine (CATAPRES) 0.3 MG tablet Take 1 tablet (0.3 mg total) by mouth 2 (two) times daily. 180 tablet 1  . clopidogrel (PLAVIX) 75 MG tablet Take 1 tablet (75 mg total) by mouth daily. 90 tablet 3  . fluticasone (FLONASE) 50 MCG/ACT nasal spray Place 2 sprays into both nostrils daily as needed for allergies or rhinitis.    . furosemide (LASIX) 40 MG tablet Take 1 tablet (40 mg total) by mouth 2 (two) times daily. 180 tablet 3  . gabapentin (NEURONTIN) 300 MG capsule Take 1 capsule (300 mg total) by mouth every morning. (Patient taking differently: Take 300 mg by mouth at bedtime. ) 90 capsule 1  . hydrALAZINE (APRESOLINE) 50 MG tablet Take 100 mg by mouth 3 (three) times daily.    . Insulin Pen Needle 31G X 4 MM MISC 1 Units by Does  not apply route 4 (four) times daily. 100 each 11  . isosorbide mononitrate (IMDUR) 60 MG 24 hr tablet Take 1 tablet (60 mg total) by mouth daily. 30 tablet 3  . LANTUS SOLOSTAR 100 UNIT/ML Solostar Pen Inject 30 Units into the skin daily.   0  . Multiple Vitamin (MULITIVITAMIN WITH MINERALS) TABS Take 1 tablet by mouth every morning.     [name] NOVOLOG FLEXPEN 100 UNIT/ML FlexPen Inject 2-7 Units into the skin daily.   9  . pravastatin (PRAVACHOL) 40 MG tablet Take 1 tablet (40 mg total) by mouth  daily. 90 tablet 3   No current facility-administered medications for this visit.    Filed Vitals:   03/30/15 1607  BP: 170/90  Pulse: 77  Height: 5\' 9"  (1.753 m)  Weight: 100.753 kg (222 lb 1.9 oz)  SpO2: 96%    PHYSICAL EXAM:  General:  Well appearing. No resp difficulty HEENT: normal Neck: supple. JVP flat. Carotids 2+ bilaterally; no bruits. No lymphadenopathy or thryomegaly appreciated. Cor: PMI normal. Regular rate & rhythm. No rubs or murmurs.  +S4.  Lungs: clear Abdomen: soft, nontender, nondistended. No hepatosplenomegaly. No bruits or masses. Good bowel sounds. Extremities: no cyanosis, clubbing, rash, edema Neuro: alert & orientedx3, cranial nerves grossly intact. Moves all 4 extremities w/o difficulty. Affect pleasant.  ASSESSMENT & PLAN:  1. Combined Systolic Heart Failure: Low EF in the past, thought to be due to poorly controlled BP.  LHC in 2004 and 2008 with no significant CAD.  Recovery of function by 2014 with EF up to 55-60%.  However, in 4/16, EF back down to 25-30%.  He had been off his BP meds at that time. Nonischemic cardiomyopathy.  NYHA class II symptoms currently, not volume overloaded.   - Continue current diuretic regimen, Lasix 40 mg bid.  - Continue carvedilol 12.5 mg twice a day and hydralazine  - Continue current dose of hydralazine. Increase Imdur to 60 mg daily. - Plan echo at end of this month before next CHF visit  2. CKD Stage III: Followed by Dr Lorrene Reid  . Not on ACEI.  Due to low GFR 3. HTN: BP improved   4. Anemia:  Last Hgb 7.1 on April 25th. Had colonoscopy a few years ago. Looking back, hemoglobin has been stably low.  ?anemia of renal disease.  Refer to hematology => may benefit from erythropoeitin.  5. CVA: on plavix , bb, and statin  F/U in CHF clinic in a month and me in 4 months     [name] 03/30/2015

## 2015-04-12 ENCOUNTER — Other Ambulatory Visit: Payer: Self-pay | Admitting: Internal Medicine

## 2015-04-14 ENCOUNTER — Other Ambulatory Visit (HOSPITAL_COMMUNITY): Payer: PPO

## 2015-04-22 ENCOUNTER — Telehealth (HOSPITAL_COMMUNITY): Payer: Self-pay | Admitting: *Deleted

## 2015-04-22 ENCOUNTER — Inpatient Hospital Stay (HOSPITAL_COMMUNITY): Admission: RE | Admit: 2015-04-22 | Payer: PPO | Source: Ambulatory Visit

## 2015-04-28 ENCOUNTER — Encounter: Payer: Self-pay | Admitting: Cardiovascular Disease

## 2015-04-29 ENCOUNTER — Other Ambulatory Visit: Payer: Self-pay | Admitting: Internal Medicine

## 2015-04-29 DIAGNOSIS — I5032 Chronic diastolic (congestive) heart failure: Secondary | ICD-10-CM

## 2015-05-03 ENCOUNTER — Other Ambulatory Visit: Payer: Self-pay | Admitting: Internal Medicine

## 2015-05-06 ENCOUNTER — Ambulatory Visit (HOSPITAL_COMMUNITY): Payer: PPO | Attending: Internal Medicine

## 2015-05-06 ENCOUNTER — Other Ambulatory Visit: Payer: Self-pay

## 2015-05-06 DIAGNOSIS — I5032 Chronic diastolic (congestive) heart failure: Secondary | ICD-10-CM | POA: Insufficient documentation

## 2015-05-06 DIAGNOSIS — I77810 Thoracic aortic ectasia: Secondary | ICD-10-CM | POA: Diagnosis not present

## 2015-05-06 DIAGNOSIS — I517 Cardiomegaly: Secondary | ICD-10-CM | POA: Diagnosis not present

## 2015-05-10 ENCOUNTER — Emergency Department (HOSPITAL_COMMUNITY)
Admission: EM | Admit: 2015-05-10 | Discharge: 2015-05-10 | Disposition: A | Discharge status: Home / Self Care | Payer: PPO | Attending: Emergency Medicine | Admitting: Emergency Medicine

## 2015-05-10 ENCOUNTER — Encounter (HOSPITAL_COMMUNITY): Payer: Self-pay | Admitting: Nurse Practitioner

## 2015-05-10 DIAGNOSIS — Z8673 Personal history of transient ischemic attack (TIA), and cerebral infarction without residual deficits: Secondary | ICD-10-CM | POA: Diagnosis not present

## 2015-05-10 DIAGNOSIS — I5042 Chronic combined systolic (congestive) and diastolic (congestive) heart failure: Secondary | ICD-10-CM | POA: Diagnosis not present

## 2015-05-10 DIAGNOSIS — I10 Essential (primary) hypertension: Secondary | ICD-10-CM | POA: Insufficient documentation

## 2015-05-10 DIAGNOSIS — Y998 Other external cause status: Secondary | ICD-10-CM | POA: Insufficient documentation

## 2015-05-10 DIAGNOSIS — E1140 Type 2 diabetes mellitus with diabetic neuropathy, unspecified: Secondary | ICD-10-CM | POA: Insufficient documentation

## 2015-05-10 DIAGNOSIS — S99922A Unspecified injury of left foot, initial encounter: Secondary | ICD-10-CM | POA: Diagnosis not present

## 2015-05-10 DIAGNOSIS — S4992XA Unspecified injury of left shoulder and upper arm, initial encounter: Secondary | ICD-10-CM | POA: Diagnosis present

## 2015-05-10 DIAGNOSIS — W07XXXA Fall from chair, initial encounter: Secondary | ICD-10-CM | POA: Insufficient documentation

## 2015-05-10 DIAGNOSIS — Z7902 Long term (current) use of antithrombotics/antiplatelets: Secondary | ICD-10-CM | POA: Diagnosis not present

## 2015-05-10 DIAGNOSIS — Y9389 Activity, other specified: Secondary | ICD-10-CM | POA: Diagnosis not present

## 2015-05-10 DIAGNOSIS — I252 Old myocardial infarction: Secondary | ICD-10-CM | POA: Diagnosis not present

## 2015-05-10 DIAGNOSIS — S3992XA Unspecified injury of lower back, initial encounter: Secondary | ICD-10-CM | POA: Diagnosis not present

## 2015-05-10 DIAGNOSIS — Z794 Long term (current) use of insulin: Secondary | ICD-10-CM | POA: Insufficient documentation

## 2015-05-10 DIAGNOSIS — Z86018 Personal history of other benign neoplasm: Secondary | ICD-10-CM | POA: Diagnosis not present

## 2015-05-10 DIAGNOSIS — H9191 Unspecified hearing loss, right ear: Secondary | ICD-10-CM | POA: Insufficient documentation

## 2015-05-10 DIAGNOSIS — E785 Hyperlipidemia, unspecified: Secondary | ICD-10-CM | POA: Insufficient documentation

## 2015-05-10 DIAGNOSIS — Z9889 Other specified postprocedural states: Secondary | ICD-10-CM | POA: Diagnosis not present

## 2015-05-10 DIAGNOSIS — Z872 Personal history of diseases of the skin and subcutaneous tissue: Secondary | ICD-10-CM | POA: Insufficient documentation

## 2015-05-10 DIAGNOSIS — Y9289 Other specified places as the place of occurrence of the external cause: Secondary | ICD-10-CM | POA: Diagnosis not present

## 2015-05-10 DIAGNOSIS — Z79899 Other long term (current) drug therapy: Secondary | ICD-10-CM | POA: Insufficient documentation

## 2015-05-10 MED ORDER — ACETAMINOPHEN-CODEINE #3 300-30 MG PO TABS: 1.0000 | ORAL_TABLET | Freq: Four times a day (QID) | ORAL | Status: DC | PRN

## 2015-05-10 NOTE — Discharge Instructions (Signed)
RICE: Routine Care for Injuries The routine care of many injuries includes Rest, Ice, Compression, and Elevation (RICE). HOME CARE INSTRUCTIONS  Rest is needed to allow your body to heal. Routine activities can usually be resumed when comfortable. Injured tendons and bones can take up to 6 weeks to heal. Tendons are the cord-like structures that attach muscle to bone.  Ice following an injury helps keep the swelling down and reduces pain.  Put ice in a plastic bag.  Place a towel between your skin and the bag.  Leave the ice on for 15-20 minutes, 3-4 times a day, or as directed by your health care provider. Do this while awake, for the first 24 to 48 hours. After that, continue as directed by your caregiver.  Compression helps keep swelling down. It also gives support and helps with discomfort. If an elastic bandage has been applied, it should be removed and reapplied every 3 to 4 hours. It should not be applied tightly, but firmly enough to keep swelling down. Watch fingers or toes for swelling, bluish discoloration, coldness, numbness, or excessive pain. If any of these problems occur, remove the bandage and reapply loosely. Contact your caregiver if these problems continue.  Elevation helps reduce swelling and decreases pain. With extremities, such as the arms, hands, legs, and feet, the injured area should be placed near or above the level of the heart, if possible. SEEK IMMEDIATE MEDICAL CARE IF:  You have persistent pain and swelling.  You develop redness, numbness, or unexpected weakness.  Your symptoms are getting worse rather than improving after several days. These symptoms may indicate that further evaluation or further X-rays are needed. Sometimes, X-rays may not show a small broken bone (fracture) until 1 week or 10 days later. Make a follow-up appointment with your caregiver. Ask when your X-ray results will be ready. Make sure you get your X-ray results. Document Released:  11/13/2000 Document Revised: 08/06/2013 Document Reviewed: 12/31/2010 ExitCare Patient Information 2015 [employer]. This information is not intended to replace advice given to you by your health care provider. Make sure you discuss any questions you have with your health care provider.  

## 2015-05-10 NOTE — ED Provider Notes (Signed)
CSN: [id number]     Arrival date & time 05/10/15  1542 History   First MD Initiated Contact with Patient 05/10/15 1611     Chief Complaint  Patient presents with  . Fall     (Consider location/radiation/quality/duration/timing/severity/associated sxs/prior Treatment) HPI   59 year old male with history of CVA currently on Plavix presents for evaluation of a fall. Patient states approximately an hour ago he was standing on a chair to hang blinds when he fell off onto the floor. He denies hitting his head or loss of consciousness. Did report that he landed on his left arm and struck his back. He was able to drive himself to the ED. Currently complaining of sharp pain to his low back, left shoulder, and left foot. Pain is moderate in intensity and nonradiating. No associated numbness. He does not think he has any broken bones. He denies any precipitating symptoms prior to the fall. No specific treatment tried.  Past Medical History  Diagnosis Date  . Dermatitis   . CHF (congestive heart failure)     LV function improved from 2004 to 2008.  Historically, moderately dilated LV with EF 30-40% by 2D echo 08/14/2002.  Mild CAD with severe LV dysfunction by cardiac cath 09/2002.  Normal coronary arteries and normal LV function by cardiac cath 09/19/2006.  A 2-D echo on 04/01/2009 showed mild concentric hypertrophy and normal systolic (LVEF  123456) and doppler C/W with grade 1 diastolic dysfunction..   . Hypertension   . Hyperlipidemia   . Hearing loss in right ear   . Cardiomyopathy     LV function improved from 2004 to 2008.  Historically, moderately dilated LV with EF 30-40% by 2D echo 08/14/2002.  Mild CAD with severe LV dysfunction by cardiac cath 09/2002.  Normal coronary arteries and normal LV function by cardiac cath 09/19/2006.  A 2-D echo on 04/01/2009 showed mild concentric hypertrophy and normal systolic (LVEF  123456) and doppler C/W with grade 1 diastolic dysfunction.  . DM neuropathy, painful    . CVA (cerebral vascular accident) 07/04/2012    MRI of the brain 07/04/2012 showed an acute infarct in the right basal ganglia involving the anterior putamen, anterior limb internal capsule, and head of the caudate; this measured approximately 2.5 cm in diameter.     . Hypertensive crisis 07/28/2012  . Myocardial infarction   . Diabetes mellitus     type 2  . Hypertensive urgency 08/20/2014  . Nephrotic syndrome 02/18/2013    A 24-hour urine collection 03/04/2013 showed total protein of 5,460 g and creatinine clearance of 80 mL/minute.  Patient was seen by Seward Meth at [place] and a repeat 24-hour urine showed 10,407 mg protein.  Patient underwent kidney biopsy on 05/30/2013; pathology showed advanced diffuse and nodular diabetic nephropathy with vascular changes consistent with long-standing difficult to control hypertension.     . Adenomatous colon polyp 07/02/2011    Last colonoscopy May 06, 2011 by Dr. Owens Loffler, who recommended repeat colonoscopy in 5 years.   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 Background diabetic retinopathy 04/20/2012    Patient is followed by Dr. Katy Fitch   . Chronic combined systolic and diastolic congestive heart failure 05/21/2010    LV function improved from 2004 to 2008.  Historically, moderately dilated LV with EF 30-40% by 2D echo 08/14/2002.  Mild CAD with severe LV dysfunction by cardiac cath 09/2002.  Normal  coronary arteries and normal LV function by cardiac cath 09/19/2006.  A 2-D echo on 04/01/2009 showed mild concentric hypertrophy and normal systolic (LVEF  123456) and doppler parameters consistent with abnormal left    Past Surgical History  Procedure Laterality Date  . Colonoscopy    . Polypectomy    . Cardiac catheterization      3 times  . Foot surgery     Family History  Problem Relation Age of Onset  . Colon cancer Sister   . Aneurysm Father  35    died of rupture   Social History  Substance Use Topics  . Smoking status: Never Smoker   . Smokeless tobacco: Never Used  . Alcohol Use: No     Comment: Wine occasionally (no more than 2 glasses per month)    Review of Systems  Constitutional: Negative for fever.  Gastrointestinal: Negative for abdominal pain.  Musculoskeletal: Positive for back pain and arthralgias. Negative for neck pain.  Skin: Negative for rash and wound.  Neurological: Negative for numbness.      Allergies  Amlodipine and Lisinopril  Home Medications   Prior to Admission medications   Medication Sig Start Date End Date Taking? Authorizing Provider  ACCU-CHEK FASTCLIX LANCETS MISC Use to check blood sugar as directed 3 times a day. Dx code E11.65, on insulin. 11/26/14   Bertha Stakes, MD  ACCU-CHEK SMARTVIEW test strip CHECK BLOOD SUGAR LEVELS UP TO THREE TIMES DAILY AS DIRECTED 10/31/14   Bertha Stakes, MD  acetaminophen (TYLENOL) 500 MG tablet Take 500 mg by mouth every 6 (six) hours as needed (pain).     Historical Provider, MD  carvedilol (COREG) 12.5 MG tablet Take 1 tablet (12.5 mg total) by mouth 2 (two) times daily. 01/05/15   Larey Dresser, MD  cloNIDine (CATAPRES) 0.3 MG tablet Take 1 tablet (0.3 mg total) by mouth 2 (two) times daily. 12/25/14   Lucious Groves, DO  clopidogrel (PLAVIX) 75 MG tablet Take 1 tablet (75 mg total) by mouth daily. 11/26/14   Lucious Groves, DO  fluticasone (FLONASE) 50 MCG/ACT nasal spray Place 2 sprays into both nostrils daily as needed for allergies or rhinitis.    Historical Provider, MD  furosemide (LASIX) 40 MG tablet Take 1 tablet (40 mg total) by mouth 2 (two) times daily. 02/03/15   Lucious Groves, DO  gabapentin (NEURONTIN) 300 MG capsule Take 1 capsule (300 mg total) by mouth every morning. Patient taking differently: Take 300 mg by mouth at bedtime.  11/26/14   Lucious Groves, DO  hydrALAZINE (APRESOLINE) 50 MG tablet Take 100 mg by mouth 3 (three) times  daily.    Historical Provider, MD  hydrALAZINE (APRESOLINE) 50 MG tablet TAKE 2 TABLETS BY MOUTH THREE TIMES DAILY 05/04/15   Lucious Groves, DO  Insulin Glargine (LANTUS SOLOSTAR) 100 UNIT/ML Solostar Pen Inject 30 Units into the skin daily at 10 pm. 04/14/15   Lucious Groves, DO  Insulin Pen Needle 31G X 4 MM MISC 1 Units by Does not apply route 4 (four) times daily. 03/26/15   Lucious Groves, DO  isosorbide mononitrate (IMDUR) 60 MG 24 hr tablet Take 1 tablet (60 mg total) by mouth daily. 01/05/15   Larey Dresser, MD  Multiple Vitamin (MULITIVITAMIN WITH MINERALS) TABS Take 1 tablet by mouth every morning.     Historical Provider, MD  NOVOLOG FLEXPEN 100 UNIT/ML FlexPen Inject 2-7 Units into the skin daily.  11/25/14   Historical  Provider, MD  pravastatin (PRAVACHOL) 40 MG tablet Take 1 tablet (40 mg total) by mouth daily. 11/26/14   Lucious Groves, DO   BP 173/86 mmHg  Pulse 77  Temp(Src) 98.6 F (37 C) (Oral)  Resp 17  SpO2 97% Physical Exam  Constitutional: He appears well-developed and well-nourished. No distress.  HENT:  Head: Atraumatic.  Eyes: Conjunctivae are normal.  Neck: Neck supple.  Musculoskeletal: He exhibits tenderness (Left shoulder: Mild generalized tenderness to anterior deltoid with normal shoulder range of motion and no deformity. Normal left elbow. Tenderness to lumbar paraspinal muscle on palpation without crepitus or step-off. ).  Left foot: Tenderness to third and fourth toes on palpation without deformity bruising or warmth.  Neurological: He is alert.  Skin: No rash noted.  Psychiatric: He has a normal mood and affect.  Nursing note and vitals reviewed.   ED Course  Procedures (including critical care time)  Patient here for evaluation of a mechanical fall. Mild tenderness to left shoulder and low back and left foot but no significant sign to suggest fractures or dislocation. Patient denied agrees imaging not indicated at this time. Rice therapy discussed.  Return precaution discussed. Patient able to ambulate. He is neurovascularly intact.    MDM   Final diagnoses:  Fall from chair, initial encounter  Shoulder injury, left, initial encounter  Lower back injury, initial encounter    BP 173/86 mmHg  Pulse 77  Temp(Src) 98.6 F (37 C) (Oral)  Resp 17  SpO2 97%     Domenic Moras, PA-C 05/10/15 [place], MD 05/10/15 2344

## 2015-05-10 NOTE — ED Notes (Signed)
He was standing on a chair to hang blinds and fell off onto floor. He c/o R lower back, L arm pain since. He fell onto his L arm, cms intact, no obvious deformity. Denies any head injury or LOC. He is A&Ox4.

## 2015-05-25 ENCOUNTER — Other Ambulatory Visit (HOSPITAL_COMMUNITY): Payer: Self-pay | Admitting: Cardiology

## 2015-06-12 NOTE — Patient Outreach (Signed)
[facility]) Care Management  06/12/2015  JONIS DEANE January 23, 1956 [name]   Notification from Erenest Rasher, RN to close case due to unable to contact patient for [place] Management services.  Thanks, [name]. [place], [place] Assistant Phone: [phone] Fax: [phone]

## 2015-06-13 ENCOUNTER — Other Ambulatory Visit: Payer: Self-pay | Admitting: Internal Medicine

## 2015-06-14 ENCOUNTER — Other Ambulatory Visit: Payer: Self-pay | Admitting: Internal Medicine

## 2015-06-17 ENCOUNTER — Other Ambulatory Visit: Payer: Self-pay | Admitting: *Deleted

## 2015-06-17 NOTE — Telephone Encounter (Signed)
duplicate

## 2015-06-29 ENCOUNTER — Encounter: Payer: Self-pay | Admitting: Cardiovascular Disease

## 2015-06-29 ENCOUNTER — Encounter: Payer: Self-pay | Admitting: Cardiology

## 2015-07-02 ENCOUNTER — Ambulatory Visit (INDEPENDENT_AMBULATORY_CARE_PROVIDER_SITE_OTHER): Payer: PPO | Admitting: Internal Medicine

## 2015-07-02 ENCOUNTER — Encounter: Payer: Self-pay | Admitting: Internal Medicine

## 2015-07-02 VITALS — BP 152/88 | HR 71 | Temp 97.9°F | Ht 69.0 in | Wt 229.6 lb

## 2015-07-02 DIAGNOSIS — I5042 Chronic combined systolic (congestive) and diastolic (congestive) heart failure: Secondary | ICD-10-CM | POA: Diagnosis not present

## 2015-07-02 DIAGNOSIS — IMO0002 Reserved for concepts with insufficient information to code with codable children: Secondary | ICD-10-CM

## 2015-07-02 DIAGNOSIS — E1165 Type 2 diabetes mellitus with hyperglycemia: Secondary | ICD-10-CM

## 2015-07-02 DIAGNOSIS — I110 Hypertensive heart disease with heart failure: Secondary | ICD-10-CM

## 2015-07-02 DIAGNOSIS — E113299 Type 2 diabetes mellitus with mild nonproliferative diabetic retinopathy without macular edema, unspecified eye: Secondary | ICD-10-CM

## 2015-07-02 DIAGNOSIS — I10 Essential (primary) hypertension: Secondary | ICD-10-CM

## 2015-07-02 DIAGNOSIS — E1122 Type 2 diabetes mellitus with diabetic chronic kidney disease: Secondary | ICD-10-CM

## 2015-07-02 DIAGNOSIS — N183 Chronic kidney disease, stage 3 (moderate): Secondary | ICD-10-CM

## 2015-07-02 DIAGNOSIS — Z794 Long term (current) use of insulin: Secondary | ICD-10-CM

## 2015-07-02 DIAGNOSIS — E11319 Type 2 diabetes mellitus with unspecified diabetic retinopathy without macular edema: Secondary | ICD-10-CM

## 2015-07-02 DIAGNOSIS — E1129 Type 2 diabetes mellitus with other diabetic kidney complication: Secondary | ICD-10-CM

## 2015-07-02 DIAGNOSIS — N289 Disorder of kidney and ureter, unspecified: Secondary | ICD-10-CM

## 2015-07-02 LAB — GLUCOSE, CAPILLARY: Glucose-Capillary: 183 mg/dL — ABNORMAL HIGH (ref 65–99)

## 2015-07-02 LAB — POCT GLYCOSYLATED HEMOGLOBIN (HGB A1C): Hemoglobin A1C: 6.3

## 2015-07-02 MED ORDER — ISOSORBIDE MONONITRATE ER 60 MG PO TB24: ORAL_TABLET | ORAL | Status: DC

## 2015-07-02 MED ORDER — GUAIFENESIN-CODEINE 100-10 MG/5ML PO SYRP: 5.0000 mL | ORAL_SOLUTION | Freq: Three times a day (TID) | ORAL | Status: DC | PRN

## 2015-07-02 MED ORDER — INSULIN GLARGINE 100 UNIT/ML SOLOSTAR PEN: 30.0000 [IU] | PEN_INJECTOR | Freq: Every day | SUBCUTANEOUS | Status: DC

## 2015-07-02 NOTE — Progress Notes (Signed)
[facility] Subjective:   Patient ID: Charles Hart male   DOB: 1956/07/11 59 y.o.   MRN: [name]  HPI: [name] is a 59 y.o. male with a PMH detailed below who presents for routine follow up for his DM and HTN  Since our last visit he has followed up with Charles Charles Hart (Nephrology) and [facility].  Please see A&P below for details on the status of his chronic medical problems.  Past Medical History  Diagnosis Date  . Dermatitis   . CHF (congestive heart failure)     LV function improved from 2004 to 2008.  Historically, moderately dilated LV with EF 30-40% by 2D echo 08/14/2002.  Mild CAD with severe LV dysfunction by cardiac cath 09/2002.  Normal coronary arteries and normal LV function by cardiac cath 09/19/2006.  A 2-D echo on 04/01/2009 showed mild concentric hypertrophy and normal systolic (LVEF  123456) and doppler C/W with grade 1 diastolic dysfunction..   . Hypertension   . Hyperlipidemia   . Hearing loss in right ear   . Cardiomyopathy     LV function improved from 2004 to 2008.  Historically, moderately dilated LV with EF 30-40% by 2D echo 08/14/2002.  Mild CAD with severe LV dysfunction by cardiac cath 09/2002.  Normal coronary arteries and normal LV function by cardiac cath 09/19/2006.  A 2-D echo on 04/01/2009 showed mild concentric hypertrophy and normal systolic (LVEF  123456) and doppler C/W with grade 1 diastolic dysfunction.  . DM neuropathy, painful   . CVA (cerebral vascular accident) 07/04/2012    MRI of the brain 07/04/2012 showed an acute infarct in the right basal ganglia involving the anterior putamen, anterior limb internal capsule, and head of the caudate; this measured approximately 2.5 cm in diameter.     . Hypertensive crisis 07/28/2012  . Myocardial infarction ([place])   . Diabetes mellitus     type 2  . Hypertensive urgency 08/20/2014  . Nephrotic syndrome 02/18/2013    A 24-hour urine collection 03/04/2013 showed total  protein of 5,460 g and creatinine clearance of 80 mL/minute.  Patient was seen by Charles Hart at [place] and a repeat 24-hour urine showed 10,407 mg protein.  Patient underwent kidney biopsy on 05/30/2013; pathology showed advanced diffuse and nodular diabetic nephropathy with vascular changes consistent with long-standing difficult to control hypertension.     . Adenomatous colon polyp 07/02/2011    Last colonoscopy May 06, 2011 by Charles Hart, who recommended repeat colonoscopy in 5 years.   [name] DIABETIC PERIPHERAL NEUROPATHY 08/03/2007    Qualifier: Diagnosis of  By: Charles Hart, Charles Hart    . Background diabetic retinopathy 04/20/2012    Patient is followed by Charles. Katy Hart   . Chronic combined systolic and diastolic congestive heart failure 05/21/2010    LV function improved from 2004 to 2008.  Historically, moderately dilated LV with EF 30-40% by 2D echo 08/14/2002.  Mild CAD with severe LV dysfunction by cardiac cath 09/2002.  Normal coronary arteries and normal LV function by cardiac cath 09/19/2006.  A 2-D echo on 04/01/2009 showed mild concentric hypertrophy and normal systolic (LVEF  123456) and doppler parameters consistent with abnormal left    Current Outpatient Prescriptions  Medication Sig Dispense Refill  . ACCU-CHEK FASTCLIX LANCETS MISC Use to check blood sugar as directed 3 times a day. Dx code E11.65, on insulin. 102 each 2  . ACCU-CHEK SMARTVIEW test strip CHECK BLOOD SUGAR LEVELS  UP TO THREE TIMES DAILY AS DIRECTED 100 each 5  . acetaminophen (TYLENOL) 500 MG tablet Take 500 mg by mouth every 6 (six) hours as needed (pain).     [name] acetaminophen-codeine (TYLENOL #3) 300-30 MG per tablet Take 1-2 tablets by mouth every 6 (six) hours as needed for moderate pain. 15 tablet 0  . BD PEN NEEDLE NANO U/F 32G X 4 MM MISC USE TO INJECT LANTUS ONCE A DAY AND NOVOLOG THREE TIMES DAILY 200 each 2  . carvedilol (COREG) 12.5 MG tablet Take 1 tablet (12.5 mg  total) by mouth 2 (two) times daily. 60 tablet 6  . cloNIDine (CATAPRES) 0.3 MG tablet Take 1 tablet (0.3 mg total) by mouth 2 (two) times daily. 180 tablet 1  . clopidogrel (PLAVIX) 75 MG tablet Take 1 tablet (75 mg total) by mouth daily. 90 tablet 3  . fluticasone (FLONASE) 50 MCG/ACT nasal spray Place 2 sprays into both nostrils daily as needed for allergies or rhinitis.    . furosemide (LASIX) 40 MG tablet Take 1 tablet (40 mg total) by mouth 2 (two) times daily. 180 tablet 3  . gabapentin (NEURONTIN) 300 MG capsule TAKE 1 CAPSULE(300 MG) BY MOUTH EVERY MORNING 90 capsule 2  . hydrALAZINE (APRESOLINE) 50 MG tablet Take 100 mg by mouth 3 (three) times daily.    . hydrALAZINE (APRESOLINE) 50 MG tablet TAKE 2 TABLETS BY MOUTH THREE TIMES DAILY 180 tablet 0  . hydrALAZINE (APRESOLINE) 50 MG tablet TAKE 2 TABLETS BY MOUTH THREE TIMES DAILY 180 tablet 1  . Insulin Glargine (LANTUS SOLOSTAR) 100 UNIT/ML Solostar Pen Inject 30 Units into the skin daily at 10 pm. 15 mL 5  . isosorbide mononitrate (IMDUR) 60 MG 24 hr tablet TAKE 1 TABLET(60 MG) BY MOUTH DAILY 30 tablet 1  . Multiple Vitamin (MULITIVITAMIN WITH MINERALS) TABS Take 1 tablet by mouth every morning.     [name] NOVOLOG FLEXPEN 100 UNIT/ML FlexPen Inject 2-7 Units into the skin daily.   9  . pravastatin (PRAVACHOL) 40 MG tablet Take 1 tablet (40 mg total) by mouth daily. 90 tablet 3   No current facility-administered medications for this visit.   Family History  Problem Relation Age of Onset  . Colon cancer Sister   . Aneurysm Father 71    died of rupture   Social History   Social History  . Marital Status: Married    Spouse Name: N/A  . Number of Children: N/A  . Years of Education: N/A   Social History Main Topics  . Smoking status: Never Smoker   . Smokeless tobacco: Never Used  . Alcohol Use: No     Comment: Wine occasionally (no more than 2 glasses per month)  . Drug Use: No  . Sexual Activity: Not Currently   Other Topics  Concern  . Not on file   Social History Narrative   Review of Systems: Review of Systems  Constitutional: Negative for fever, chills, weight loss and malaise/fatigue.  Respiratory: Negative for cough and shortness of breath.   Cardiovascular: Negative for chest pain and leg swelling.  Gastrointestinal: Negative for heartburn and abdominal pain.  Genitourinary: Negative for dysuria.  Musculoskeletal: Negative for myalgias.  Neurological: Negative for dizziness and headaches.  Psychiatric/Behavioral: Negative for depression, suicidal ideas and substance abuse.     Objective:  Physical Exam: Filed Vitals:   07/02/15 1404 07/02/15 1424  BP: 163/98 152/88  Pulse: 71   Temp: 97.9 F (36.6 C)   TempSrc:  Oral   Height: 5\' 9"  (1.753 m)   Weight: 229 lb 9.6 oz (104.146 kg)   SpO2: 99%    Physical Exam  Constitutional: He is well-developed, well-nourished, and in no distress.  Cardiovascular: Normal rate and regular rhythm.   Pulmonary/Chest: Effort normal and breath sounds normal. He has no rales.  Abdominal: Soft. Bowel sounds are normal. He exhibits no distension.  Musculoskeletal: He exhibits no edema.  Nursing note and vitals reviewed.   Wt Readings from Last 5 Encounters:  03/30/15 222 lb 1.9 oz (100.753 kg)  03/26/15 222 lb 9.6 oz (100.971 kg)  01/07/15 206 lb (93.441 kg)  01/05/15 206 lb 12.8 oz (93.804 kg)  12/25/14 215 lb 11.2 oz (97.841 kg)    Assessment & Plan:  Case discussed with Charles. Dareen Piano  Essential hypertension BP improved on manual recheck, still slightly above goal.  Will have him continue his current medical regimen and I stressed importance of a low salt diet.  Chronic combined systolic and diastolic congestive heart failure Wt Readings from Last 5 Encounters:  07/02/15 229 lb 9.6 oz (104.146 kg)  03/30/15 222 lb 1.9 oz (100.753 kg)  03/26/15 222 lb 9.6 oz (100.971 kg)  01/07/15 206 lb (93.441 kg)  01/05/15 206 lb 12.8 oz (93.804 kg)   His weight  is slightly increased from his last visit but he has no SOB, LE edema and appears overall euvolemic.   DM (diabetes mellitus), type 2, uncontrolled, with renal complications He reports he is doing very well managing his DM.  He has been using 30u of lantus and 7units TIDAC of novolog.  He has had no hypoglycemic episodes.  -A1c returned at goal - Continue current insulin regimen. - Reminded patient to see opthomologist    Background diabetic retinopathy [facility]) Patient is due for follow up with Charles Charles Hart    Medications Ordered Meds ordered this encounter  Medications  . isosorbide mononitrate (IMDUR) 60 MG 24 hr tablet    Sig: TAKE 1 TABLET(60 MG) BY MOUTH DAILY    Dispense:  30 tablet    Refill:  1  . guaiFENesin-codeine (ROBITUSSIN AC) 100-10 MG/5ML syrup    Sig: Take 5 mLs by mouth 3 (three) times daily as needed for cough.    Dispense:  120 mL    Refill:  1  . Insulin Glargine (LANTUS SOLOSTAR) 100 UNIT/ML Solostar Pen    Sig: Inject 30 Units into the skin daily at 10 pm.    Dispense:  15 mL    Refill:  5   Other Orders Orders Placed This Encounter  Procedures  . Glucose, capillary  . POC Hbg A1C   Follow Up: Return in about 3 months (around 10/02/2015).

## 2015-07-05 NOTE — Assessment & Plan Note (Signed)
Patient is due for follow up with Dr Katy Fitch

## 2015-07-05 NOTE — Assessment & Plan Note (Signed)
Wt Readings from Last 5 Encounters:  07/02/15 229 lb 9.6 oz (104.146 kg)  03/30/15 222 lb 1.9 oz (100.753 kg)  03/26/15 222 lb 9.6 oz (100.971 kg)  01/07/15 206 lb (93.441 kg)  01/05/15 206 lb 12.8 oz (93.804 kg)   His weight is slightly increased from his last visit but he has no SOB, LE edema and appears overall euvolemic.

## 2015-07-05 NOTE — Assessment & Plan Note (Signed)
He reports he is doing very well managing his DM.  He has been using 30u of lantus and 7units TIDAC of novolog.  He has had no hypoglycemic episodes.  -A1c returned at goal - Continue current insulin regimen. - Reminded patient to see opthomologist

## 2015-07-05 NOTE — Assessment & Plan Note (Signed)
BP improved on manual recheck, still slightly above goal.  Will have him continue his current medical regimen and I stressed importance of a low salt diet.

## 2015-07-06 NOTE — Progress Notes (Signed)
Internal Medicine Clinic Attending  Case discussed with Dr. Hoffman soon after the resident saw the patient.  We reviewed the resident's history and exam and pertinent patient test results.  I agree with the assessment, diagnosis, and plan of care documented in the resident's note. 

## 2015-07-15 ENCOUNTER — Other Ambulatory Visit (HOSPITAL_COMMUNITY): Payer: Self-pay | Admitting: Cardiology

## 2015-07-15 ENCOUNTER — Other Ambulatory Visit: Payer: Self-pay | Admitting: Internal Medicine

## 2015-07-15 NOTE — Progress Notes (Signed)
Patient ID: Charles Hart, male   DOB: 1956/02/16, 59 y.o.   MRN: [name] PCP:Dr Hoffman  Primary Cardiologist: Dr [name]  Nephrology: Dr Lorrene Reid [facility]   HPI:  Charles Hart is a 59 y.o.   AA with poorly controlled HTN, CVA 2013 on plavix,  Hyperlipidemia, MI , heart caths 2004 and 2008 with normal cors, and DM complicated by CRF. History of CHF with depressed EF in the past, but ECHO  August 2014 showed recovery with EF 55-60% and Grade II DD but in April 2016 EF was back down to 25-30%.  He is not on ace due to allergy and CKD.    Admitted the end of April 2016 with HTN urgency and worsening renal function. He was on NTG drip short term. Creatinine 2.46 most recently but as high as 5 on admission.    Last seen by CHF clinic in May  Today he is feeling pretty good. Denies SOB/PND/Orthopnea. Not weighing at home.  Has a scale. Following low diet. Drinking > 2 liters. In the past he would stop taking medications due to cost.   Labs 05/2015 : K 3.5 Creatinine 2.68   Hct 35.7   SH: Wife helps him at home. 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 Does not smoke or drink alcohol.   FH:- Sister and brother heart attacks.   ROS: All systems negative except as listed in HPI, PMH and Problem List.  Echo 05/06/15 Reviewed  Study Conclusions  - Procedure narrative: Transthoracic echocardiography for left ventricular function evaluation. Image quality was suboptimal. The study was technically difficult, as a result of poor acoustic windows, poor patient compliance, and body habitus. - Left ventricle: The cavity size was normal. Wall thickness was increased in a pattern of moderate LVH. Systolic function was mildly to moderately reduced. The estimated ejection fraction was in the range of 40% to 45%. There may be inferoapical hypokinesis. Doppler parameters are consistent with abnormal left ventricular relaxation (grade 1 diastolic dysfunction). The E/e&' ratio is >15,  suggesting elevated LV filling pressure. - Aorta: Aortic root dimension: 42 mm (ED). Ascending aortic diameter: 38 mm (S). - Aortic root: The aortic root is dilated. - Ascending aorta: The ascending aorta was normal in size. - Left atrium: The atrium was mildly dilated. - Right ventricle: The cavity size was normal. Wall thickness was normal. Systolic function was normal. - Right atrium: The atrium was mildly dilated.  Impressions:  - Compared to a prior echo in 11/2014, the EF has improved to 40-45%. There may be mild inferoapical hypokinesis, LV filling pressure is elevated. 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  SH:  Social History   Social History  . Marital Status: Married    Spouse Name: N/A  . Number of Children: N/A  . Years of Education: N/A   Occupational History  . Not on file.   Social History Main Topics  . Smoking status: Never Smoker   . Smokeless tobacco: Never Used  . Alcohol Use: No     Comment: Wine occasionally (no more than 2 glasses per month)  . Drug Use: No  . Sexual Activity: Not Currently   Other Topics Concern  . Not on file   Social History Narrative    FH:  Family History  Problem Relation Age of Onset  . Colon cancer Sister   . Aneurysm Father 47    died of rupture    Past Medical History  Diagnosis Date  . Dermatitis   .  CHF (congestive heart failure) (HCC)     LV function improved from 2004 to 2008.  Historically, moderately dilated LV with EF 30-40% by 2D echo 08/14/2002.  Mild CAD with severe LV dysfunction by cardiac cath 09/2002.  Normal coronary arteries and normal LV function by cardiac cath 09/19/2006.  A 2-D echo on 04/01/2009 showed mild concentric hypertrophy and normal systolic (LVEF  123456) and doppler C/W with grade 1 diastolic dysfunction..   . Hypertension   . Hyperlipidemia   . Hearing loss in right ear   . Cardiomyopathy     LV function improved from 2004 to 2008.  Historically, moderately dilated LV with  EF 30-40% by 2D echo 08/14/2002.  Mild CAD with severe LV dysfunction by cardiac cath 09/2002.  Normal coronary arteries and normal LV function by cardiac cath 09/19/2006.  A 2-D echo on 04/01/2009 showed mild concentric hypertrophy and normal systolic (LVEF  123456) and doppler C/W with grade 1 diastolic dysfunction.  . DM neuropathy, painful ([place])   . CVA (cerebral vascular accident) ([place]) 07/04/2012    MRI of the brain 07/04/2012 showed an acute infarct in the right basal ganglia involving the anterior putamen, anterior limb internal capsule, and head of the caudate; this measured approximately 2.5 cm in diameter.     . Hypertensive crisis 07/28/2012  . Myocardial infarction ([place])   . Diabetes mellitus     type 2  . Hypertensive urgency 08/20/2014  . Nephrotic syndrome 02/18/2013    A 24-hour urine collection 03/04/2013 showed total protein of 5,460 g and creatinine clearance of 80 mL/minute.  Patient was seen by Seward Meth at [place] and a repeat 24-hour urine showed 10,407 mg protein.  Patient underwent kidney biopsy on 05/30/2013; pathology showed advanced diffuse and nodular diabetic nephropathy with vascular changes consistent with long-standing difficult to control hypertension.     . Adenomatous colon polyp 07/02/2011    Last colonoscopy May 06, 2011 by Dr. Owens Loffler, who recommended repeat colonoscopy in 5 years.   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 Background diabetic retinopathy 04/20/2012    Patient is followed by Dr. Katy Fitch   . Chronic combined systolic and diastolic congestive heart failure ([place]) 05/21/2010    LV function improved from 2004 to 2008.  Historically, moderately dilated LV with EF 30-40% by 2D echo 08/14/2002.  Mild CAD with severe LV dysfunction by cardiac cath 09/2002.  Normal coronary arteries and normal LV function by cardiac cath 09/19/2006.  A 2-D echo on 04/01/2009  showed mild concentric hypertrophy and normal systolic (LVEF  123456) and doppler parameters consistent with abnormal left     Current Outpatient Prescriptions  Medication Sig Dispense Refill  . ACCU-CHEK FASTCLIX LANCETS MISC Use to check blood sugar as directed 3 times a day. Dx code E11.65, on insulin. 102 each 2  . ACCU-CHEK SMARTVIEW test strip CHECK BLOOD SUGAR LEVELS UP TO THREE TIMES DAILY AS DIRECTED 100 each 5  . acetaminophen (TYLENOL) 500 MG tablet Take 500 mg by mouth every 6 (six) hours as needed (pain).     . BD PEN NEEDLE NANO U/F 32G X 4 MM MISC USE TO INJECT LANTUS ONCE A DAY AND NOVOLOG THREE TIMES DAILY 200 each 2  . carvedilol (COREG) 12.5 MG tablet Take 1 tablet (12.5 mg total) by mouth 2 (two) times daily. 60 tablet 6  . cloNIDine (CATAPRES) 0.3 MG tablet Take  1 tablet (0.3 mg total) by mouth 2 (two) times daily. 180 tablet 1  . clopidogrel (PLAVIX) 75 MG tablet Take 1 tablet (75 mg total) by mouth daily. 90 tablet 3  . fluticasone (FLONASE) 50 MCG/ACT nasal spray Place 2 sprays into both nostrils daily as needed for allergies or rhinitis.    . furosemide (LASIX) 40 MG tablet Take 1 tablet (40 mg total) by mouth 2 (two) times daily. 180 tablet 3  . gabapentin (NEURONTIN) 300 MG capsule TAKE 1 CAPSULE(300 MG) BY MOUTH EVERY MORNING 90 capsule 2  . guaiFENesin-codeine (ROBITUSSIN AC) 100-10 MG/5ML syrup Take 5 mLs by mouth 3 (three) times daily as needed for cough. 120 mL 1  . hydrALAZINE (APRESOLINE) 50 MG tablet Take 100 mg by mouth 3 (three) times daily.    . Insulin Glargine (LANTUS SOLOSTAR) 100 UNIT/ML Solostar Pen Inject 30 Units into the skin daily at 10 pm. 15 mL 5  . isosorbide mononitrate (IMDUR) 60 MG 24 hr tablet TAKE 1 TABLET(60 MG) BY MOUTH DAILY 30 tablet 1  . Multiple Vitamin (MULITIVITAMIN WITH MINERALS) TABS Take 1 tablet by mouth every morning.     [name] NOVOLOG FLEXPEN 100 UNIT/ML FlexPen Inject 2-7 Units into the skin daily.   9  . pravastatin (PRAVACHOL)  40 MG tablet Take 1 tablet (40 mg total) by mouth daily. 90 tablet 3   No current facility-administered medications for this visit.    Filed Vitals:   07/16/15 1531  BP: 172/86  Pulse: 76  Height: 6' (1.829 m)  Weight: 105.144 kg (231 lb 12.8 oz)    PHYSICAL EXAM:  General:  Well appearing. No resp difficulty HEENT: normal Neck: supple. JVP flat. Carotids 2+ bilaterally; no bruits. No lymphadenopathy or thryomegaly appreciated. Cor: PMI normal. Regular rate & rhythm. No rubs or murmurs.  +S4.  Lungs: clear Abdomen: soft, nontender, nondistended. No hepatosplenomegaly. No bruits or masses. Good bowel sounds. Extremities: no cyanosis, clubbing, rash, edema Neuro: alert & orientedx3, cranial nerves grossly intact. Moves all 4 extremities w/o difficulty. Affect pleasant.   ECG:  12/09/14  SR rate 93 LVH with chronic T wave changes inverted in lateral leads  ASSESSMENT & PLAN:  1. Combined Systolic Heart Failure: Low EF in the past, thought to be due to poorly controlled BP.  LHC in 2004 and 2008 with no significant CAD.  Recovery of function by 2014 with EF up to 55-60%.  However, in 4/16, EF back down to 25-30%.  He had been off his BP meds at that time. On meds September EF 40-45%  Nonischemic cardiomyopathy.  NYHA class II symptoms currently, not volume overloaded.   - Continue current diuretic regimen, Lasix 40 mg bid.  - Continue carvedilol 12.5 mg twice a day and hydralazine  - Continue current dose of hydralazine. Increase Imdur to 60 mg daily.  2. CKD Stage III: Followed by Dr Lorrene Reid . Not on ACEI.  Due to low GFR He has some unrealistic expectations and "refuses" to be on dialysis  3. HTN: BP improved   4. Anemia:  Improved not on erythropoitin at this time  5. CVA: on plavix , bb, and statin   Valentina Shaggy 07/16/2015

## 2015-07-16 ENCOUNTER — Ambulatory Visit (INDEPENDENT_AMBULATORY_CARE_PROVIDER_SITE_OTHER): Payer: PPO | Admitting: Cardiovascular Disease

## 2015-07-16 ENCOUNTER — Ambulatory Visit: Payer: PPO | Admitting: Cardiovascular Disease

## 2015-07-16 ENCOUNTER — Encounter: Payer: Self-pay | Admitting: Cardiovascular Disease

## 2015-07-16 VITALS — BP 150/80 | HR 76 | Ht 72.0 in | Wt 231.8 lb

## 2015-07-16 DIAGNOSIS — I10 Essential (primary) hypertension: Secondary | ICD-10-CM

## 2015-07-16 NOTE — Patient Instructions (Signed)

## 2015-07-22 LAB — HM DIABETES EYE EXAM

## 2015-08-24 ENCOUNTER — Other Ambulatory Visit: Payer: Self-pay | Admitting: Internal Medicine

## 2015-08-27 ENCOUNTER — Other Ambulatory Visit: Payer: Self-pay

## 2015-09-09 DIAGNOSIS — I129 Hypertensive chronic kidney disease with stage 1 through stage 4 chronic kidney disease, or unspecified chronic kidney disease: Secondary | ICD-10-CM | POA: Diagnosis not present

## 2015-09-09 DIAGNOSIS — I5040 Unspecified combined systolic (congestive) and diastolic (congestive) heart failure: Secondary | ICD-10-CM | POA: Diagnosis not present

## 2015-09-09 DIAGNOSIS — D649 Anemia, unspecified: Secondary | ICD-10-CM | POA: Diagnosis not present

## 2015-09-09 DIAGNOSIS — N183 Chronic kidney disease, stage 3 (moderate): Secondary | ICD-10-CM | POA: Diagnosis not present

## 2015-09-09 DIAGNOSIS — E1129 Type 2 diabetes mellitus with other diabetic kidney complication: Secondary | ICD-10-CM | POA: Diagnosis not present

## 2015-09-09 DIAGNOSIS — E785 Hyperlipidemia, unspecified: Secondary | ICD-10-CM | POA: Diagnosis not present

## 2015-09-10 DIAGNOSIS — H2511 Age-related nuclear cataract, right eye: Secondary | ICD-10-CM | POA: Diagnosis not present

## 2015-09-10 DIAGNOSIS — E113592 Type 2 diabetes mellitus with proliferative diabetic retinopathy without macular edema, left eye: Secondary | ICD-10-CM | POA: Diagnosis not present

## 2015-09-10 DIAGNOSIS — E113591 Type 2 diabetes mellitus with proliferative diabetic retinopathy without macular edema, right eye: Secondary | ICD-10-CM | POA: Diagnosis not present

## 2015-09-10 DIAGNOSIS — H4312 Vitreous hemorrhage, left eye: Secondary | ICD-10-CM | POA: Diagnosis not present

## 2015-09-10 DIAGNOSIS — H3561 Retinal hemorrhage, right eye: Secondary | ICD-10-CM | POA: Diagnosis not present

## 2015-09-19 ENCOUNTER — Inpatient Hospital Stay (HOSPITAL_COMMUNITY)
Admission: EM | Admit: 2015-09-19 | Discharge: 2015-09-21 | DRG: 304 | Disposition: A | Discharge status: Home / Self Care | Payer: PPO | Attending: Internal Medicine | Admitting: Internal Medicine

## 2015-09-19 ENCOUNTER — Inpatient Hospital Stay (HOSPITAL_COMMUNITY): Payer: PPO

## 2015-09-19 ENCOUNTER — Encounter (HOSPITAL_COMMUNITY): Payer: Self-pay

## 2015-09-19 ENCOUNTER — Emergency Department (HOSPITAL_COMMUNITY): Payer: PPO

## 2015-09-19 DIAGNOSIS — N183 Chronic kidney disease, stage 3 unspecified: Secondary | ICD-10-CM

## 2015-09-19 DIAGNOSIS — I252 Old myocardial infarction: Secondary | ICD-10-CM | POA: Diagnosis not present

## 2015-09-19 DIAGNOSIS — Z800 Family history of malignant neoplasm of digestive organs: Secondary | ICD-10-CM

## 2015-09-19 DIAGNOSIS — I5043 Acute on chronic combined systolic (congestive) and diastolic (congestive) heart failure: Secondary | ICD-10-CM | POA: Diagnosis not present

## 2015-09-19 DIAGNOSIS — I5042 Chronic combined systolic (congestive) and diastolic (congestive) heart failure: Secondary | ICD-10-CM | POA: Diagnosis present

## 2015-09-19 DIAGNOSIS — H9191 Unspecified hearing loss, right ear: Secondary | ICD-10-CM | POA: Diagnosis not present

## 2015-09-19 DIAGNOSIS — J9601 Acute respiratory failure with hypoxia: Secondary | ICD-10-CM

## 2015-09-19 DIAGNOSIS — E1121 Type 2 diabetes mellitus with diabetic nephropathy: Secondary | ICD-10-CM | POA: Diagnosis not present

## 2015-09-19 DIAGNOSIS — I2510 Atherosclerotic heart disease of native coronary artery without angina pectoris: Secondary | ICD-10-CM | POA: Diagnosis present

## 2015-09-19 DIAGNOSIS — E1165 Type 2 diabetes mellitus with hyperglycemia: Secondary | ICD-10-CM | POA: Diagnosis not present

## 2015-09-19 DIAGNOSIS — Z794 Long term (current) use of insulin: Secondary | ICD-10-CM | POA: Diagnosis not present

## 2015-09-19 DIAGNOSIS — E1142 Type 2 diabetes mellitus with diabetic polyneuropathy: Secondary | ICD-10-CM | POA: Diagnosis not present

## 2015-09-19 DIAGNOSIS — Z7902 Long term (current) use of antithrombotics/antiplatelets: Secondary | ICD-10-CM | POA: Diagnosis not present

## 2015-09-19 DIAGNOSIS — I161 Hypertensive emergency: Principal | ICD-10-CM | POA: Diagnosis present

## 2015-09-19 DIAGNOSIS — R0602 Shortness of breath: Secondary | ICD-10-CM | POA: Diagnosis not present

## 2015-09-19 DIAGNOSIS — E785 Hyperlipidemia, unspecified: Secondary | ICD-10-CM | POA: Diagnosis not present

## 2015-09-19 DIAGNOSIS — I130 Hypertensive heart and chronic kidney disease with heart failure and stage 1 through stage 4 chronic kidney disease, or unspecified chronic kidney disease: Secondary | ICD-10-CM | POA: Diagnosis not present

## 2015-09-19 DIAGNOSIS — J111 Influenza due to unidentified influenza virus with other respiratory manifestations: Secondary | ICD-10-CM | POA: Diagnosis present

## 2015-09-19 DIAGNOSIS — J811 Chronic pulmonary edema: Secondary | ICD-10-CM

## 2015-09-19 DIAGNOSIS — N19 Unspecified kidney failure: Secondary | ICD-10-CM

## 2015-09-19 DIAGNOSIS — Z9114 Patient's other noncompliance with medication regimen: Secondary | ICD-10-CM

## 2015-09-19 DIAGNOSIS — J9691 Respiratory failure, unspecified with hypoxia: Secondary | ICD-10-CM | POA: Diagnosis present

## 2015-09-19 DIAGNOSIS — R069 Unspecified abnormalities of breathing: Secondary | ICD-10-CM | POA: Diagnosis not present

## 2015-09-19 DIAGNOSIS — J101 Influenza due to other identified influenza virus with other respiratory manifestations: Secondary | ICD-10-CM | POA: Diagnosis present

## 2015-09-19 DIAGNOSIS — E11319 Type 2 diabetes mellitus with unspecified diabetic retinopathy without macular edema: Secondary | ICD-10-CM | POA: Diagnosis present

## 2015-09-19 DIAGNOSIS — N179 Acute kidney failure, unspecified: Secondary | ICD-10-CM | POA: Diagnosis not present

## 2015-09-19 DIAGNOSIS — I10 Essential (primary) hypertension: Secondary | ICD-10-CM

## 2015-09-19 DIAGNOSIS — G4733 Obstructive sleep apnea (adult) (pediatric): Secondary | ICD-10-CM | POA: Diagnosis present

## 2015-09-19 DIAGNOSIS — Z79899 Other long term (current) drug therapy: Secondary | ICD-10-CM | POA: Diagnosis not present

## 2015-09-19 DIAGNOSIS — E1122 Type 2 diabetes mellitus with diabetic chronic kidney disease: Secondary | ICD-10-CM | POA: Diagnosis not present

## 2015-09-19 DIAGNOSIS — E872 Acidosis: Secondary | ICD-10-CM | POA: Diagnosis not present

## 2015-09-19 DIAGNOSIS — J810 Acute pulmonary edema: Secondary | ICD-10-CM | POA: Diagnosis not present

## 2015-09-19 DIAGNOSIS — Z8673 Personal history of transient ischemic attack (TIA), and cerebral infarction without residual deficits: Secondary | ICD-10-CM

## 2015-09-19 LAB — BASIC METABOLIC PANEL
Anion gap: 19 — ABNORMAL HIGH (ref 5–15)
Anion gap: 19 — ABNORMAL HIGH (ref 5–15)
BUN: 60 mg/dL — AB (ref 6–20)
BUN: 65 mg/dL — AB (ref 6–20)
CALCIUM: 8.7 mg/dL — AB (ref 8.9–10.3)
CHLORIDE: 100 mmol/L — AB (ref 101–111)
CO2: 20 mmol/L — ABNORMAL LOW (ref 22–32)
CO2: 21 mmol/L — AB (ref 22–32)
CREATININE: 3.66 mg/dL — AB (ref 0.61–1.24)
Calcium: 8 mg/dL — ABNORMAL LOW (ref 8.9–10.3)
Chloride: 100 mmol/L — ABNORMAL LOW (ref 101–111)
Creatinine, Ser: 3.51 mg/dL — ABNORMAL HIGH (ref 0.61–1.24)
GFR calc Af Amer: 21 mL/min — ABNORMAL LOW (ref >60)
GFR calc Af Amer: 20 mL/min — ABNORMAL LOW (ref >60)
GFR calc non Af Amer: 17 mL/min — ABNORMAL LOW (ref >60)
GFR, EST NON AFRICAN AMERICAN: 18 mL/min — AB (ref >60)
GLUCOSE: 271 mg/dL — AB (ref 65–99)
Glucose, Bld: 286 mg/dL — ABNORMAL HIGH (ref 65–99)
POTASSIUM: 3.4 mmol/L — AB (ref 3.5–5.1)
Potassium: 3.9 mmol/L (ref 3.5–5.1)
Sodium: 139 mmol/L (ref 135–145)
Sodium: 140 mmol/L (ref 135–145)

## 2015-09-19 LAB — URINALYSIS, ROUTINE W REFLEX MICROSCOPIC
BILIRUBIN URINE: NEGATIVE
GLUCOSE, UA: 100 mg/dL — AB
HGB URINE DIPSTICK: NEGATIVE
KETONES UR: NEGATIVE mg/dL
Leukocytes, UA: NEGATIVE
Nitrite: NEGATIVE
Specific Gravity, Urine: 1.012 (ref 1.005–1.030)
pH: 5 (ref 5.0–8.0)

## 2015-09-19 LAB — I-STAT TROPONIN, ED: Troponin i, poc: 0.34 ng/mL (ref 0.00–0.08)

## 2015-09-19 LAB — HEPATIC FUNCTION PANEL
ALBUMIN: 2.8 g/dL — AB (ref 3.5–5.0)
ALT: 15 U/L — ABNORMAL LOW (ref 17–63)
AST: 31 U/L (ref 15–41)
Alkaline Phosphatase: 41 U/L (ref 38–126)
BILIRUBIN TOTAL: 0.9 mg/dL (ref 0.3–1.2)
Bilirubin, Direct: 0.3 mg/dL (ref 0.1–0.5)
Indirect Bilirubin: 0.6 mg/dL (ref 0.3–0.9)
Total Protein: 6.1 g/dL — ABNORMAL LOW (ref 6.5–8.1)

## 2015-09-19 LAB — CBC WITH DIFFERENTIAL/PLATELET
BASOS ABS: 0 10*3/uL (ref 0.0–0.1)
Basophils Relative: 0 %
EOS PCT: 0 %
Eosinophils Absolute: 0 10*3/uL (ref 0.0–0.7)
HCT: 40.9 % (ref 39.0–52.0)
Hemoglobin: 13.6 g/dL (ref 13.0–17.0)
LYMPHS PCT: 9 %
Lymphs Abs: 0.8 10*3/uL (ref 0.7–4.0)
MCH: 28.6 pg (ref 26.0–34.0)
MCHC: 33.3 g/dL (ref 30.0–36.0)
MCV: 85.9 fL (ref 78.0–100.0)
MONO ABS: 0.3 10*3/uL (ref 0.1–1.0)
Monocytes Relative: 4 %
Neutro Abs: 7.9 10*3/uL — ABNORMAL HIGH (ref 1.7–7.7)
Neutrophils Relative %: 87 %
PLATELETS: 185 10*3/uL (ref 150–400)
RBC: 4.76 MIL/uL (ref 4.22–5.81)
RDW: 12.7 % (ref 11.5–15.5)
WBC: 9 10*3/uL (ref 4.0–10.5)

## 2015-09-19 LAB — EXPECTORATED SPUTUM ASSESSMENT W GRAM STAIN, RFLX TO RESP C

## 2015-09-19 LAB — MAGNESIUM: MAGNESIUM: 1.5 mg/dL — AB (ref 1.7–2.4)

## 2015-09-19 LAB — I-STAT CG4 LACTIC ACID, ED: Lactic Acid, Venous: 2.56 mmol/L (ref 0.5–2.0)

## 2015-09-19 LAB — GLUCOSE, CAPILLARY
GLUCOSE-CAPILLARY: 176 mg/dL — AB (ref 65–99)
GLUCOSE-CAPILLARY: 151 mg/dL — AB (ref 65–99)
Glucose-Capillary: 269 mg/dL — ABNORMAL HIGH (ref 65–99)
Glucose-Capillary: 116 mg/dL — ABNORMAL HIGH (ref 65–99)

## 2015-09-19 LAB — RAPID URINE DRUG SCREEN, HOSP PERFORMED
Amphetamines: NOT DETECTED
BARBITURATES: NOT DETECTED
BENZODIAZEPINES: NOT DETECTED
COCAINE: NOT DETECTED
Opiates: POSITIVE — AB
TETRAHYDROCANNABINOL: NOT DETECTED

## 2015-09-19 LAB — I-STAT ARTERIAL BLOOD GAS, ED
Acid-base deficit: 1 mmol/L (ref 0.0–2.0)
BICARBONATE: 23.5 meq/L (ref 20.0–24.0)
O2 Saturation: 95 %
PCO2 ART: 38.9 mmHg (ref 35.0–45.0)
PO2 ART: 78 mmHg — AB (ref 80.0–100.0)
Patient temperature: 99
TCO2: 25 mmol/L (ref 0–100)
pH, Arterial: 7.39 (ref 7.350–7.450)

## 2015-09-19 LAB — URINE MICROSCOPIC-ADD ON: SQUAMOUS EPITHELIAL / LPF: NONE SEEN

## 2015-09-19 LAB — PROCALCITONIN: PROCALCITONIN: 0.55 ng/mL

## 2015-09-19 LAB — INFLUENZA PANEL BY PCR (TYPE A & B)
H1N1FLUPCR: NOT DETECTED
INFLAPCR: NEGATIVE
INFLBPCR: POSITIVE — AB

## 2015-09-19 LAB — LACTIC ACID, PLASMA: Lactic Acid, Venous: 2.1 mmol/L (ref 0.5–2.0)

## 2015-09-19 LAB — EXPECTORATED SPUTUM ASSESSMENT W REFEX TO RESP CULTURE

## 2015-09-19 LAB — TROPONIN I
Troponin I: 0.29 ng/mL — ABNORMAL HIGH (ref <0.031)
Troponin I: 0.31 ng/mL — ABNORMAL HIGH (ref <0.031)

## 2015-09-19 LAB — PHOSPHORUS: Phosphorus: 4.6 mg/dL (ref 2.5–4.6)

## 2015-09-19 LAB — MRSA PCR SCREENING: MRSA BY PCR: NEGATIVE

## 2015-09-19 LAB — BRAIN NATRIURETIC PEPTIDE: B Natriuretic Peptide: 1844 pg/mL — ABNORMAL HIGH (ref 0.0–100.0)

## 2015-09-19 MED ORDER — HYDRALAZINE HCL 20 MG/ML IJ SOLN: 10.0000 mg | INTRAMUSCULAR | Status: DC | PRN

## 2015-09-19 MED ORDER — ONDANSETRON HCL 4 MG/2ML IJ SOLN
4.0000 mg | Freq: Once | INTRAMUSCULAR | Status: AC
  Administered 2015-09-19: 4 mg via INTRAVENOUS
  Filled 2015-09-19: qty 2

## 2015-09-19 MED ORDER — ONDANSETRON HCL 4 MG/2ML IJ SOLN
INTRAMUSCULAR | Status: AC
  Administered 2015-09-19: 4 mg via INTRAVENOUS
  Filled 2015-09-19: qty 2

## 2015-09-19 MED ORDER — ISOSORBIDE MONONITRATE ER 60 MG PO TB24: 60.0000 mg | ORAL_TABLET | Freq: Every day | ORAL | Status: DC

## 2015-09-19 MED ORDER — ENOXAPARIN SODIUM 30 MG/0.3ML ~~LOC~~ SOLN
30.0000 mg | Freq: Every day | SUBCUTANEOUS | Status: DC
  Administered 2015-09-19 – 2015-09-21 (×3): 30 mg via SUBCUTANEOUS
  Filled 2015-09-19 (×3): qty 0.3

## 2015-09-19 MED ORDER — ACETAMINOPHEN 325 MG PO TABS: 650.0000 mg | ORAL_TABLET | Freq: Four times a day (QID) | ORAL | Status: DC | PRN

## 2015-09-19 MED ORDER — CLONIDINE HCL 0.2 MG PO TABS
0.3000 mg | ORAL_TABLET | Freq: Three times a day (TID) | ORAL | Status: DC
  Administered 2015-09-19 – 2015-09-20 (×2): 0.3 mg via ORAL
  Filled 2015-09-19: qty 3
  Filled 2015-09-19 (×2): qty 1

## 2015-09-19 MED ORDER — INSULIN ASPART 100 UNIT/ML ~~LOC~~ SOLN
0.0000 [IU] | Freq: Three times a day (TID) | SUBCUTANEOUS | Status: DC
  Administered 2015-09-19: 11 [IU] via SUBCUTANEOUS
  Administered 2015-09-19: 4 [IU] via SUBCUTANEOUS
  Administered 2015-09-20 (×2): 3 [IU] via SUBCUTANEOUS

## 2015-09-19 MED ORDER — FUROSEMIDE 10 MG/ML IJ SOLN
40.0000 mg | Freq: Once | INTRAMUSCULAR | Status: AC
  Administered 2015-09-19: 40 mg via INTRAVENOUS
  Filled 2015-09-19: qty 4

## 2015-09-19 MED ORDER — DEXTROSE 5 % IV SOLN
1.0000 g | Freq: Once | INTRAVENOUS | Status: AC
  Administered 2015-09-19: 1 g via INTRAVENOUS
  Filled 2015-09-19: qty 10

## 2015-09-19 MED ORDER — INSULIN GLARGINE 100 UNIT/ML ~~LOC~~ SOLN: 20.0000 [IU] | Freq: Every day | SUBCUTANEOUS | Status: DC

## 2015-09-19 MED ORDER — FUROSEMIDE 10 MG/ML IJ SOLN
80.0000 mg | Freq: Three times a day (TID) | INTRAMUSCULAR | Status: DC
  Administered 2015-09-19 – 2015-09-20 (×4): 80 mg via INTRAVENOUS
  Filled 2015-09-19 (×4): qty 8

## 2015-09-19 MED ORDER — PROMETHAZINE HCL 25 MG/ML IJ SOLN: 12.5000 mg | Freq: Four times a day (QID) | INTRAMUSCULAR | Status: DC | PRN

## 2015-09-19 MED ORDER — INSULIN GLARGINE 100 UNIT/ML ~~LOC~~ SOLN
25.0000 [IU] | Freq: Every day | SUBCUTANEOUS | Status: DC
  Administered 2015-09-19 – 2015-09-20 (×2): 25 [IU] via SUBCUTANEOUS
  Filled 2015-09-19 (×4): qty 0.25

## 2015-09-19 MED ORDER — SENNOSIDES-DOCUSATE SODIUM 8.6-50 MG PO TABS: 1.0000 | ORAL_TABLET | Freq: Every evening | ORAL | Status: DC | PRN

## 2015-09-19 MED ORDER — CARVEDILOL 12.5 MG PO TABS
12.5000 mg | ORAL_TABLET | Freq: Two times a day (BID) | ORAL | Status: DC
Start: 2015-09-19 — End: 2015-09-21
  Administered 2015-09-19 – 2015-09-21 (×6): 12.5 mg via ORAL
  Filled 2015-09-19 (×6): qty 1

## 2015-09-19 MED ORDER — NITROGLYCERIN IN D5W 200-5 MCG/ML-% IV SOLN
10.0000 ug/min | INTRAVENOUS | Status: DC
  Administered 2015-09-19: 10 ug/min via INTRAVENOUS
  Administered 2015-09-19: 200 ug/min via INTRAVENOUS
  Filled 2015-09-19 (×2): qty 250

## 2015-09-19 MED ORDER — CLOPIDOGREL BISULFATE 75 MG PO TABS
75.0000 mg | ORAL_TABLET | Freq: Every day | ORAL | Status: DC
  Administered 2015-09-19 – 2015-09-21 (×3): 75 mg via ORAL
  Filled 2015-09-19 (×3): qty 1

## 2015-09-19 MED ORDER — DEXTROSE 5 % IV SOLN
500.0000 mg | Freq: Once | INTRAVENOUS | Status: AC
  Administered 2015-09-19: 500 mg via INTRAVENOUS
  Filled 2015-09-19: qty 500

## 2015-09-19 MED ORDER — CETYLPYRIDINIUM CHLORIDE 0.05 % MT LIQD
7.0000 mL | Freq: Two times a day (BID) | OROMUCOSAL | Status: DC
  Administered 2015-09-19 – 2015-09-21 (×4): 7 mL via OROMUCOSAL

## 2015-09-19 MED ORDER — POTASSIUM CHLORIDE 10 MEQ/100ML IV SOLN
10.0000 meq | INTRAVENOUS | Status: AC
  Administered 2015-09-19 (×3): 10 meq via INTRAVENOUS
  Filled 2015-09-19 (×3): qty 100

## 2015-09-19 MED ORDER — ISOSORBIDE MONONITRATE ER 60 MG PO TB24
60.0000 mg | ORAL_TABLET | Freq: Every day | ORAL | Status: DC
  Administered 2015-09-20 – 2015-09-21 (×2): 60 mg via ORAL
  Filled 2015-09-19 (×2): qty 1

## 2015-09-19 MED ORDER — ONDANSETRON HCL 4 MG/2ML IJ SOLN
4.0000 mg | Freq: Once | INTRAMUSCULAR | Status: AC
  Administered 2015-09-19: 4 mg via INTRAVENOUS

## 2015-09-19 MED ORDER — OSELTAMIVIR PHOSPHATE 75 MG PO CAPS: 75.0000 mg | ORAL_CAPSULE | Freq: Every day | ORAL | Status: DC

## 2015-09-19 MED ORDER — OSELTAMIVIR PHOSPHATE 30 MG PO CAPS
30.0000 mg | ORAL_CAPSULE | Freq: Every day | ORAL | Status: DC
  Administered 2015-09-19 – 2015-09-21 (×3): 30 mg via ORAL
  Filled 2015-09-19 (×3): qty 1

## 2015-09-19 MED ORDER — ACETAMINOPHEN 650 MG RE SUPP: 650.0000 mg | Freq: Four times a day (QID) | RECTAL | Status: DC | PRN

## 2015-09-19 MED ORDER — SODIUM CHLORIDE 0.9% FLUSH
3.0000 mL | Freq: Two times a day (BID) | INTRAVENOUS | Status: DC
  Administered 2015-09-19 – 2015-09-21 (×5): 3 mL via INTRAVENOUS

## 2015-09-19 MED ORDER — PRAVASTATIN SODIUM 40 MG PO TABS
40.0000 mg | ORAL_TABLET | Freq: Every day | ORAL | Status: DC
  Administered 2015-09-19 – 2015-09-21 (×3): 40 mg via ORAL
  Filled 2015-09-19 (×3): qty 1

## 2015-09-19 MED ORDER — CLONIDINE HCL 0.1 MG PO TABS
0.3000 mg | ORAL_TABLET | Freq: Two times a day (BID) | ORAL | Status: DC
  Administered 2015-09-19: 0.3 mg via ORAL
  Filled 2015-09-19: qty 3

## 2015-09-19 MED ORDER — HYDRALAZINE HCL 50 MG PO TABS
100.0000 mg | ORAL_TABLET | Freq: Three times a day (TID) | ORAL | Status: DC
  Administered 2015-09-19 – 2015-09-20 (×4): 100 mg via ORAL
  Filled 2015-09-19 (×6): qty 2

## 2015-09-19 MED ORDER — AMLODIPINE BESYLATE 5 MG PO TABS: 5.0000 mg | ORAL_TABLET | Freq: Every day | ORAL | Status: DC

## 2015-09-19 MED ORDER — MAGNESIUM SULFATE 2 GM/50ML IV SOLN
2.0000 g | Freq: Once | INTRAVENOUS | Status: AC
  Administered 2015-09-19: 2 g via INTRAVENOUS
  Filled 2015-09-19: qty 50

## 2015-09-19 NOTE — Progress Notes (Signed)
PULMONARY / CRITICAL CARE MEDICINE HISTORY AND PHYSICAL EXAMINATION   Name: Charles Hart MRN: [name] DOB: [birth year]    ADMISSION DATE:  09/19/2015  PRIMARY SERVICE: [name]  CHIEF COMPLAINT:  dyspnea  BRIEF PATIENT DESCRIPTION: 60 y/o M w/ hx of CHF presenting with hypertensive emergency and CHF exacerbation  SIGNIFICANT EVENTS / STUDIES:  BiPAP given in ED  LINES / TUBES: BiPAP 2/4 >> PIV   CULTURES: Blood 2/4 >> Lower Resp Cx 2/4 >> (not yet collected)  ANTIBIOTICS: Ceftriaxone 2/4 >> Azithromycin 2/4 >>   HISTORY OF PRESENT ILLNESS:   See IM H&P for details, but briefly, this is a 60 y/o man with a history of cardiomyopathy and severe hypertension who presented with dyspnea and hypertensive emergency. He had a prodrome of nausea, and stopped taking some of his blood pressure medications, but he's not sure which. He was given lasix in the ED and started on BiPAP, as well a nitroglycerin gtt. [name] was admitted as he had not stabilized after three hours.  SUBJECTIVE: No events overnight.  VITAL SIGNS: Temp:  [99 F (37.2 C)] 99 F (37.2 C) (02/04 0257) Pulse Rate:  [93-107] 93 (02/04 0815) Resp:  [17-35] 32 (02/04 0815) BP: (171-235)/(98-172) 171/98 mmHg (02/04 1011) SpO2:  [85 %-100 %] 86 % (02/04 0815) Weight:  [104.781 kg (231 lb)] 104.781 kg (231 lb) (02/04 0257) HEMODYNAMICS:   VENTILATOR SETTINGS:   INTAKE / OUTPUT: Intake/Output      02/03 0701 - 02/04 0700 02/04 0701 - 02/05 0700   I.V. (mL/kg)  169.6 (1.6)   IV Piggyback  350   Total Intake(mL/kg)  519.6 (5)   Urine (mL/kg/hr)  350 (0.9)   Total Output   350   Net   +169.6          PHYSICAL EXAMINATION: General:  Mildly agitated man lying in bed with venturi mask on. Neuro:  Awake, alert, follow all commands HEENT:  BiPAP mask in place, edentulus. Neck: Elevated JVD Cardiovascular:  Heart sounds dual and normal Lungs: Coarse breath sounds throughout. Abdomen:  soft Musculoskeletal:  No  swollen joints Skin:  No rashes.  LABS:  CBC  Recent Labs Lab 09/19/15 0305  WBC 9.0  HGB 13.6  HCT 40.9  PLT 185   Coag's No results for input(s): APTT, INR in the last 168 hours. BMET  Recent Labs Lab 09/19/15 0305  NA 139  K 3.4*  CL 100*  CO2 20*  BUN 60*  CREATININE 3.51*  GLUCOSE 271*   Electrolytes  Recent Labs Lab 09/19/15 0305 09/19/15 0620  CALCIUM 8.7*  --   MG  --  1.5*  PHOS  --  4.6   Sepsis Markers  Recent Labs Lab 09/19/15 0336 09/19/15 0620  LATICACIDVEN 2.56* 2.1*  PROCALCITON  --  0.55   ABG  Recent Labs Lab 09/19/15 0315  PHART 7.390  PCO2ART 38.9  PO2ART 78.0*   Liver Enzymes  Recent Labs Lab 09/19/15 0620  AST 31  ALT 15*  ALKPHOS 41  BILITOT 0.9  ALBUMIN 2.8*   Cardiac Enzymes No results for input(s): TROPONINI, PROBNP in the last 168 hours. Glucose  Recent Labs Lab 09/19/15 0815  GLUCAP 269*    Imaging Dg Chest Port 1 View  09/19/2015  CLINICAL DATA:  Flash pulmonary edema. EXAM: PORTABLE CHEST 1 VIEW COMPARISON:  09/19/2015 at 0304 hours FINDINGS: The cardiac silhouette remains enlarged. Patchy bilateral perihilar and basilar lung opacities and confluent opacity in the right upper lung  are stable to minimally improved. No sizable pleural effusion or pneumothorax is identified. IMPRESSION: Stable to minimally improved lung opacities which may reflect edema or infection. Electronically Signed   By: Logan Bores M.D.   On: 09/19/2015 07:37   Dg Chest Portable 1 View  09/19/2015  CLINICAL DATA:  Shortness of breath tonight. EXAM: PORTABLE CHEST 1 VIEW COMPARISON:  12/07/2014 FINDINGS: Cardiomegaly and mediastinal contours are unchanged. Development of multifocal bilateral pulmonary opacities. Ill-defined patchy opacities in the perihilar regions, with the more confluent right upper lung zone opacity. No large pleural effusion. No pneumothorax. IMPRESSION: Multifocal bilateral pulmonary opacities. Pulmonary edema  versus infection, or combination of both. Electronically Signed   By: Jeb Levering M.D.   On: 09/19/2015 03:27    EKG: sinus tach w/ LVH CXR: Diffuse bilateral airspace disease consistent with pulmonary edema.  ASSESSMENT / PLAN:  Principal Problem:   Hypertensive emergency Active Problems:   DM (diabetes mellitus), type 2, uncontrolled, with renal complications (HCC)   Hyperlipidemia   Acute on chronic combined systolic (congestive) and diastolic (congestive) heart failure (HCC)   History of stroke   Obstructive sleep apnea   Acute worsening of stage 3 chronic kidney disease   Acute pulmonary edema (HCC)   Respiratory failure with hypoxia (HCC)   Flash pulmonary edema (HCC)   PULMONARY A: Pulmonary Edema Hypoxic respiratory failure P:   S/p lasix, still requiring venturi mask. All appears to be cardiogenic given lack of other s/sx of inflammatory / infectious process.  Titrate O2 for sat of 88-92%.  CARDIOVASCULAR A: Hypertensive Emergency P:   Restart all home meds, nitroglycerin gtt. Increase clonidine to q8 hours. Add norvasc 5 mg with holding parameters.  RENAL A: AKI on CKD Hypomagnesemia P:   Replete lytes, expect improvement in renal fxn w/ diuresis. BMET in AM.  GASTROINTESTINAL A: Nausea P:   Begin diet. PPI  HEMATOLOGIC A: No active issues P:   Maintain plavix for stroke risk  INFECTIOUS A: No active issues P:   Unlikely to be infectious process driving clinical picture.  ENDOCRINE A: T2DM P:   SSI Carb modified diet.  NEUROLOGIC A: No active issues P:   Minimize sedating drugs. Check UDS to insure no cocaine on board.  TODAY'S SUMMARY: 60 y/o man with HTN emergency, skipped taking some of his anti-HTN and decompensated.  Will add norvasc, increase clonidine to q8 and will titrate NTG down.  Begin diet as well.  Hold in the ICU until off NTG.  The patient is critically ill with multiple organ systems failure and requires  high complexity decision making for assessment and support, frequent evaluation and titration of therapies, application of advanced monitoring technologies and extensive interpretation of multiple databases.   Critical Care Time devoted to patient care services described in this note is  35  Minutes. This time reflects time of care of this signee Dr Jennet Maduro. This critical care time does not reflect procedure time, or teaching time or supervisory time of PA/NP/Med student/Med Resident etc but could involve care discussion time.  Rush Farmer, M.D. [facility] Pulmonary/Critical Care Medicine. Pager: [phone]. After hours pager: [phone].  09/19/2015, 10:55 AM

## 2015-09-19 NOTE — H&P (Signed)
PULMONARY / CRITICAL CARE MEDICINE HISTORY AND PHYSICAL EXAMINATION   Name: Charles Hart MRN: [name] DOB: [birth year]    ADMISSION DATE:  09/19/2015  PRIMARY SERVICE: [name]  CHIEF COMPLAINT:  dyspnea  BRIEF PATIENT DESCRIPTION: 60 y/o M w/ hx of CHF presenting with hypertensive emergency and CHF exacerbation  SIGNIFICANT EVENTS / STUDIES:  BiPAP given in ED  LINES / TUBES: BiPAP 2/4 >> PIV   CULTURES: Blood 2/4 >> Lower Resp Cx 2/4 >> (not yet collected)  ANTIBIOTICS: Ceftriaxone 2/4 >> Azithromycin 2/4 >>   HISTORY OF PRESENT ILLNESS:   See IM H&P for details, but briefly, this is a 60 y/o man with a history of cardiomyopathy and severe hypertension who presented with dyspnea and hypertensive emergency. He had a prodrome of nausea, and stopped taking some of his blood pressure medications, but he's not sure which. He was given lasix in the ED and started on BiPAP, as well a nitroglycerin gtt. [name] was admitted as he had not stabilized after three hours.  PAST MEDICAL HISTORY :  Past Medical History  Diagnosis Date  . Dermatitis   . CHF (congestive heart failure) (HCC)     LV function improved from 2004 to 2008.  Historically, moderately dilated LV with EF 30-40% by 2D echo 08/14/2002.  Mild CAD with severe LV dysfunction by cardiac cath 09/2002.  Normal coronary arteries and normal LV function by cardiac cath 09/19/2006.  A 2-D echo on 04/01/2009 showed mild concentric hypertrophy and normal systolic (LVEF  123456) and doppler C/W with grade 1 diastolic dysfunction..   . Hypertension   . Hyperlipidemia   . Hearing loss in right ear   . Cardiomyopathy     LV function improved from 2004 to 2008.  Historically, moderately dilated LV with EF 30-40% by 2D echo 08/14/2002.  Mild CAD with severe LV dysfunction by cardiac cath 09/2002.  Normal coronary arteries and normal LV function by cardiac cath 09/19/2006.  A 2-D echo on 04/01/2009 showed mild concentric hypertrophy and normal  systolic (LVEF  123456) and doppler C/W with grade 1 diastolic dysfunction.  . DM neuropathy, painful ([place])   . CVA (cerebral vascular accident) ([place]) 07/04/2012    MRI of the brain 07/04/2012 showed an acute infarct in the right basal ganglia involving the anterior putamen, anterior limb internal capsule, and head of the caudate; this measured approximately 2.5 cm in diameter.     . Hypertensive crisis 07/28/2012  . Myocardial infarction ([place])   . Diabetes mellitus     type 2  . Hypertensive urgency 08/20/2014  . Adenomatous colon polyp 07/02/2011    Last colonoscopy May 06, 2011 by Dr. Owens Loffler, who recommended repeat colonoscopy in 5 years.   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 Background diabetic retinopathy 04/20/2012    Patient is followed by Dr. Katy Fitch   . Chronic combined systolic and diastolic congestive heart failure ([place]) 05/21/2010    LV function improved from 2004 to 2008.  Historically, moderately dilated LV with EF 30-40% by 2D echo 08/14/2002.  Mild CAD with severe LV dysfunction by cardiac cath 09/2002.  Normal coronary arteries and normal LV function by cardiac cath 09/19/2006.  A 2-D echo on 04/01/2009 showed mild concentric hypertrophy and normal systolic (LVEF  123456) and doppler parameters consistent with abnormal left   . Nephrotic syndrome 02/18/2013    A 24-hour urine collection 03/04/2013 showed total protein of  5,460 g and creatinine clearance of 80 mL/minute.  Patient was seen by Seward Meth at [facility] and a repeat 24-hour urine showed 10,407 mg protein.  Patient underwent kidney biopsy on 05/30/2013; pathology showed advanced diffuse and nodular diabetic nephropathy with vascular changes consistent with long-standing difficult to control hypertension.      Past Surgical History  Procedure Laterality Date  . Colonoscopy    . Polypectomy    . Cardiac catheterization       3 times  . Foot surgery     Prior to Admission medications   Medication Sig Start Date End Date Taking? Authorizing Provider  acetaminophen (TYLENOL) 500 MG tablet Take 500 mg by mouth every 6 (six) hours as needed (pain).     Historical Provider, MD  BD PEN NEEDLE NANO U/F 32G X 4 MM MISC USE TO INJECT LANTUS ONCE A DAY AND NOVOLOG THREE TIMES DAILY 06/16/15   Lucious Groves, DO  carvedilol (COREG) 12.5 MG tablet Take 1 tablet (12.5 mg total) by mouth 2 (two) times daily. 01/05/15   Larey Dresser, MD  cloNIDine (CATAPRES) 0.3 MG tablet Take 1 tablet (0.3 mg total) by mouth 2 (two) times daily. 12/25/14   Lucious Groves, DO  clopidogrel (PLAVIX) 75 MG tablet Take 1 tablet (75 mg total) by mouth daily. 11/26/14   Lucious Groves, DO  fluticasone (FLONASE) 50 MCG/ACT nasal spray Place 2 sprays into both nostrils daily as needed for allergies or rhinitis.    Historical Provider, MD  furosemide (LASIX) 40 MG tablet Take 1 tablet (40 mg total) by mouth 2 (two) times daily. 02/03/15   Lucious Groves, DO  gabapentin (NEURONTIN) 300 MG capsule TAKE 1 CAPSULE(300 MG) BY MOUTH EVERY MORNING 06/16/15   Lucious Groves, DO  guaiFENesin-codeine (ROBITUSSIN AC) 100-10 MG/5ML syrup Take 5 mLs by mouth 3 (three) times daily as needed for cough. 07/02/15   Lucious Groves, DO  hydrALAZINE (APRESOLINE) 50 MG tablet TAKE 2 TABLETS BY MOUTH THREE TIMES DAILY 08/24/15   Sid Falcon, MD  Insulin Glargine (LANTUS SOLOSTAR) 100 UNIT/ML Solostar Pen Inject 30 Units into the skin daily at 10 pm. 07/02/15   Lucious Groves, DO  isosorbide mononitrate (IMDUR) 60 MG 24 hr tablet TAKE 1 TABLET(60 MG) BY MOUTH DAILY 07/02/15   Lucious Groves, DO  Multiple Vitamin (MULITIVITAMIN WITH MINERALS) TABS Take 1 tablet by mouth every morning.     Historical Provider, MD  NOVOLOG FLEXPEN 100 UNIT/ML FlexPen Inject 2-7 Units into the skin daily.  11/25/14   Historical Provider, MD  ONE TOUCH ULTRA TEST test strip USE TO TEST BLOOD SUGAR  THREE TIMES DAILY 07/16/15   Lucious Groves, DO  ONETOUCH DELICA LANCETS 99991111 MISC USE TO TEST BLOOD SUGAR THREE TIMES DAILY 07/16/15   Lucious Groves, DO  pravastatin (PRAVACHOL) 40 MG tablet Take 1 tablet (40 mg total) by mouth daily. 11/26/14   Lucious Groves, DO   Allergies  Allergen Reactions  . Amlodipine Swelling  . Lisinopril Cough    FAMILY HISTORY:  Family History  Problem Relation Age of Onset  . Colon cancer Sister   . Aneurysm Father 28    died of rupture   SOCIAL HISTORY:  reports that he has never smoked. He has never used smokeless tobacco. He reports that he does not drink alcohol or use illicit drugs.  REVIEW OF SYSTEMS:  Per HPI  SUBJECTIVE:  VITAL SIGNS: Temp:  [99 F (37.2 C)] 99 F (37.2 C) (02/04 0257) Pulse Rate:  [96-107] 96 (02/04 0640) Resp:  [22-35] 33 (02/04 0640) BP: (176-235)/(113-172) 204/133 mmHg (02/04 0545) SpO2:  [85 %-100 %] 96 % (02/04 0640) Weight:  [231 lb (104.781 kg)] 231 lb (104.781 kg) (02/04 0257) HEMODYNAMICS:   VENTILATOR SETTINGS:   INTAKE / OUTPUT: Intake/Output    None     PHYSICAL EXAMINATION: General:  Mildly agitated man lying in gurney with BiPAP mask on. Neuro:  Awake, alert, follow all commands HEENT:  BiPAP mask in place, edentulus. Neck: Elevated JVD Cardiovascular:  Heart sounds dual and normal Lungs: Coarse breath sounds throughout. Abdomen:  soft Musculoskeletal:  No swollen joints Skin:  No rashes.  LABS:  CBC  Recent Labs Lab 09/19/15 0305  WBC 9.0  HGB 13.6  HCT 40.9  PLT 185   Coag's No results for input(s): APTT, INR in the last 168 hours. BMET  Recent Labs Lab 09/19/15 0305  NA 139  K 3.4*  CL 100*  CO2 20*  BUN 60*  CREATININE 3.51*  GLUCOSE 271*   Electrolytes  Recent Labs Lab 09/19/15 0305  CALCIUM 8.7*   Sepsis Markers  Recent Labs Lab 09/19/15 0336  LATICACIDVEN 2.56*   ABG  Recent Labs Lab 09/19/15 0315  PHART 7.390  PCO2ART 38.9  PO2ART 78.0*    Liver Enzymes No results for input(s): AST, ALT, ALKPHOS, BILITOT, ALBUMIN in the last 168 hours. Cardiac Enzymes No results for input(s): TROPONINI, PROBNP in the last 168 hours. Glucose No results for input(s): GLUCAP in the last 168 hours.  Imaging Dg Chest Portable 1 View  09/19/2015  CLINICAL DATA:  Shortness of breath tonight. EXAM: PORTABLE CHEST 1 VIEW COMPARISON:  12/07/2014 FINDINGS: Cardiomegaly and mediastinal contours are unchanged. Development of multifocal bilateral pulmonary opacities. Ill-defined patchy opacities in the perihilar regions, with the more confluent right upper lung zone opacity. No large pleural effusion. No pneumothorax. IMPRESSION: Multifocal bilateral pulmonary opacities. Pulmonary edema versus infection, or combination of both. Electronically Signed   By: Jeb Levering M.D.   On: 09/19/2015 03:27    EKG: sinus tach w/ LVH CXR: Diffuse bilateral airspace disease consistent with pulmonary edema.  ASSESSMENT / PLAN:  Principal Problem:   Hypertensive emergency Active Problems:   DM (diabetes mellitus), type 2, uncontrolled, with renal complications (HCC)   Hyperlipidemia   Acute on chronic combined systolic (congestive) and diastolic (congestive) heart failure (HCC)   History of stroke   Obstructive sleep apnea   Acute worsening of stage 3 chronic kidney disease   Acute pulmonary edema (HCC)   Respiratory failure with hypoxia (HCC)   Flash pulmonary edema (HCC)   PULMONARY A: Pulmonary Edema Hypoxic respiratory failure P:   S/p lasix, still requiring BIPAP. All appears to be cardiogenic given lack of other s/sx of inflammatory / infectious process. Will get BP control and diurese and again attempt to liberate from BiPAP.  CARDIOVASCULAR A: Hypertensive Emergency P:   Restart all home meds, nitroglycerin gtt.  RENAL A: AKI on CKD Hypomagnesemia P:   Replete lytes, expect improvement in renal fxn w/  diuresis.  GASTROINTESTINAL A: Nausea P:   May be due to HTN. Expect to see improvement with BP control. Supportive care   HEMATOLOGIC A: No active issues P:   Maintain plavix for stroke risk  INFECTIOUS A: No active issues P:   Unlikely to be infectious process driving clinical picture.  ENDOCRINE A: T2DM P:  SSI  NEUROLOGIC A: No active issues P:    BEST PRACTICE / DISPOSITION Level of Care:  ICU Primary Service:  [name] Consultants:  None Code Status:  Full Diet:  NPO DVT Px:  Lovenox (low GFR) GI Px:  None Skin Integrity:  Intact Social / Family:  None, not necessary  TODAY'S SUMMARY: 60 y/o man with HTN emergency  I have personally obtained a history, examined the patient, evaluated laboratory and imaging results, formulated the assessment and plan and placed orders.  CRITICAL CARE: The patient is critically ill with multiple organ systems failure and requires high complexity decision making for assessment and support, frequent evaluation and titration of therapies, application of advanced monitoring technologies and extensive interpretation of multiple databases. Critical Care Time devoted to patient care services described in this note is 30 minutes.   Luz Brazen, MD Pulmonary and [place] Pager: [phone]   09/19/2015, 6:52 AM

## 2015-09-19 NOTE — ED Notes (Signed)
Pt comes from home via [facility], called out fro flu like symptoms, pt was very SOB with stats in at 76%, hx of CHF, placed on CPAP, pt given 4mg  of zofan IM PTA.

## 2015-09-19 NOTE — H&P (Signed)
Date: 09/19/2015               Patient Name:  Charles Hart MRN: [id number]  DOB: 12-11-1955 Age / Sex: 60 y.o., male   PCP: Lucious Groves, DO         Medical Service: Internal Medicine Teaching Service         Attending Physician: Dr. Oval Linsey, MD    First Contact: Dr. Juleen China Pager: [phone]  Second Contact: Dr. Posey Pronto Pager: [phone]       After Hours (After 5p/  First Contact Pager: [phone]  weekends / holidays): Second Contact Pager: [phone]   Chief Complaint: Shortness of breath  History of Present Illness: 60 y/o man with past medical history of combined diastolic/systolic congestive heart failure, CAD, CVA of right basal ganglia 2014, diabetes, CKD stage III, hypertension with history of hypertensive emergency presented to the emergency department via EMS after calling from home due to worsening shortness of breath. He was feeling usual health until approximately 2-3 days ago when he developed cough, nausea, and vomiting that worsened over the interval. He was able to tolerate fluid intake but not eat. He also felt a gradual onset and worsening shortness of breath as well as a mostly nonproductive cough.  On arrival EMS noted him to be hypoxic to 70%s and partially improved with cPAP to 80s for transport to ED. In Ed he was placed on Bipap with 50% FiO2 with improvement in saturation. Blood pressured noted to be [id number] systolic and he was placed on nitro infusion. CXR was obtained showing diffuse bilateral infiltrates and started on initial dose of IV abtx and lasix.  He denies chest pain, or fevers/chills during this time. He does not report any recent change to his medications or inability to take them as directed. He does not recall any other precipitating events and denies other sick contacts at home.  Meds: Current Facility-Administered Medications  Medication Dose Route Frequency Provider Last Rate Last Dose  . acetaminophen (TYLENOL) tablet 650 mg  650 mg Oral Q6H  PRN Juluis Mire, MD       Or  . acetaminophen (TYLENOL) suppository 650 mg  650 mg Rectal Q6H PRN Marjan Rabbani, MD      . azithromycin (ZITHROMAX) 500 mg in dextrose 5 % 250 mL IVPB  500 mg Intravenous Once Ripley Fraise, MD 250 mL/hr at 09/19/15 0510 500 mg at 09/19/15 0510  . cloNIDine (CATAPRES) tablet 0.3 mg  0.3 mg Oral BID Marjan Rabbani, MD      . clopidogrel (PLAVIX) tablet 75 mg  75 mg Oral Daily Marjan Rabbani, MD      . enoxaparin (LOVENOX) injection 30 mg  30 mg Subcutaneous Q24H Marjan Rabbani, MD      . furosemide (LASIX) injection 80 mg  80 mg Intravenous TID Marjan Rabbani, MD      . hydrALAZINE (APRESOLINE) tablet 100 mg  100 mg Oral TID Juluis Mire, MD      . isosorbide mononitrate (IMDUR) 24 hr tablet 60 mg  60 mg Oral Daily Marjan Rabbani, MD      . nitroGLYCERIN 50 mg in dextrose 5 % 250 mL (0.2 mg/mL) infusion  10-200 mcg/min Intravenous Titrated Ripley Fraise, MD 36 mL/hr at 09/19/15 0509 120 mcg/min at 09/19/15 0509  . potassium chloride 10 mEq in 100 mL IVPB  10 mEq Intravenous Q1 Hr x 3 Marjan Rabbani, MD      . promethazine (PHENERGAN) injection 12.5 mg  12.5 mg  Intravenous Q6H PRN Marjan Rabbani, MD      . senna-docusate (Senokot-S) tablet 1 tablet  1 tablet Oral QHS PRN Marjan Rabbani, MD      . sodium chloride flush (NS) 0.9 % injection 3 mL  3 mL Intravenous Q12H Juluis Mire, MD       Current Outpatient Prescriptions  Medication Sig Dispense Refill  . acetaminophen (TYLENOL) 500 MG tablet Take 500 mg by mouth every 6 (six) hours as needed (pain).     . BD PEN NEEDLE NANO U/F 32G X 4 MM MISC USE TO INJECT LANTUS ONCE A DAY AND NOVOLOG THREE TIMES DAILY 200 each 2  . carvedilol (COREG) 12.5 MG tablet Take 1 tablet (12.5 mg total) by mouth 2 (two) times daily. 60 tablet 6  . cloNIDine (CATAPRES) 0.3 MG tablet Take 1 tablet (0.3 mg total) by mouth 2 (two) times daily. 180 tablet 1  . clopidogrel (PLAVIX) 75 MG tablet Take 1 tablet (75 mg total) by mouth  daily. 90 tablet 3  . fluticasone (FLONASE) 50 MCG/ACT nasal spray Place 2 sprays into both nostrils daily as needed for allergies or rhinitis.    . furosemide (LASIX) 40 MG tablet Take 1 tablet (40 mg total) by mouth 2 (two) times daily. 180 tablet 3  . gabapentin (NEURONTIN) 300 MG capsule TAKE 1 CAPSULE(300 MG) BY MOUTH EVERY MORNING 90 capsule 2  . guaiFENesin-codeine (ROBITUSSIN AC) 100-10 MG/5ML syrup Take 5 mLs by mouth 3 (three) times daily as needed for cough. 120 mL 1  . hydrALAZINE (APRESOLINE) 50 MG tablet TAKE 2 TABLETS BY MOUTH THREE TIMES DAILY 180 tablet 0  . Insulin Glargine (LANTUS SOLOSTAR) 100 UNIT/ML Solostar Pen Inject 30 Units into the skin daily at 10 pm. 15 mL 5  . isosorbide mononitrate (IMDUR) 60 MG 24 hr tablet TAKE 1 TABLET(60 MG) BY MOUTH DAILY 30 tablet 1  . Multiple Vitamin (MULITIVITAMIN WITH MINERALS) TABS Take 1 tablet by mouth every morning.     [name] NOVOLOG FLEXPEN 100 UNIT/ML FlexPen Inject 2-7 Units into the skin daily.   9  . ONE TOUCH ULTRA TEST test strip USE TO TEST BLOOD SUGAR THREE TIMES DAILY 100 each 11  . ONETOUCH DELICA LANCETS 99991111 MISC USE TO TEST BLOOD SUGAR THREE TIMES DAILY 100 each 11  . pravastatin (PRAVACHOL) 40 MG tablet Take 1 tablet (40 mg total) by mouth daily. 90 tablet 3    Allergies: Allergies as of 09/19/2015 - Review Complete 09/19/2015  Allergen Reaction Noted  . Amlodipine Swelling 07/10/2013  . Lisinopril Cough 06/19/2013   Past Medical History  Diagnosis Date  . Dermatitis   . CHF (congestive heart failure) (HCC)     LV function improved from 2004 to 2008.  Historically, moderately dilated LV with EF 30-40% by 2D echo 08/14/2002.  Mild CAD with severe LV dysfunction by cardiac cath 09/2002.  Normal coronary arteries and normal LV function by cardiac cath 09/19/2006.  A 2-D echo on 04/01/2009 showed mild concentric hypertrophy and normal systolic (LVEF  123456) and doppler C/W with grade 1 diastolic dysfunction..   . Hypertension    . Hyperlipidemia   . Hearing loss in right ear   . Cardiomyopathy     LV function improved from 2004 to 2008.  Historically, moderately dilated LV with EF 30-40% by 2D echo 08/14/2002.  Mild CAD with severe LV dysfunction by cardiac cath 09/2002.  Normal coronary arteries and normal LV function by cardiac cath 09/19/2006.  A 2-D  echo on 04/01/2009 showed mild concentric hypertrophy and normal systolic (LVEF  123456) and doppler C/W with grade 1 diastolic dysfunction.  . DM neuropathy, painful ([place])   . CVA (cerebral vascular accident) ([place]) 07/04/2012    MRI of the brain 07/04/2012 showed an acute infarct in the right basal ganglia involving the anterior putamen, anterior limb internal capsule, and head of the caudate; this measured approximately 2.5 cm in diameter.     . Hypertensive crisis 07/28/2012  . Myocardial infarction ([place])   . Diabetes mellitus     type 2  . Hypertensive urgency 08/20/2014  . Nephrotic syndrome 02/18/2013    A 24-hour urine collection 03/04/2013 showed total protein of 5,460 g and creatinine clearance of 80 mL/minute.  Patient was seen by Seward Meth at [place] and a repeat 24-hour urine showed 10,407 mg protein.  Patient underwent kidney biopsy on 05/30/2013; pathology showed advanced diffuse and nodular diabetic nephropathy with vascular changes consistent with long-standing difficult to control hypertension.     . Adenomatous colon polyp 07/02/2011    Last colonoscopy May 06, 2011 by Dr. Owens Loffler, who recommended repeat colonoscopy in 5 years.   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 Background diabetic retinopathy 04/20/2012    Patient is followed by Dr. Katy Fitch   . Chronic combined systolic and diastolic congestive heart failure ([place]) 05/21/2010    LV function improved from 2004 to 2008.  Historically, moderately dilated LV with EF 30-40% by 2D echo 08/14/2002.  Mild  CAD with severe LV dysfunction by cardiac cath 09/2002.  Normal coronary arteries and normal LV function by cardiac cath 09/19/2006.  A 2-D echo on 04/01/2009 showed mild concentric hypertrophy and normal systolic (LVEF  123456) and doppler parameters consistent with abnormal left    Past Surgical History  Procedure Laterality Date  . Colonoscopy    . Polypectomy    . Cardiac catheterization      3 times  . Foot surgery     Family History  Problem Relation Age of Onset  . Colon cancer Sister   . Aneurysm Father 59    died of rupture   Social History   Social History  . Marital Status: Married    Spouse Name: N/A  . Number of Children: N/A  . Years of Education: N/A   Occupational History  . Not on file.   Social History Main Topics  . Smoking status: Never Smoker   . Smokeless tobacco: Never Used  . Alcohol Use: No     Comment: Wine occasionally (no more than 2 glasses per month)  . Drug Use: No  . Sexual Activity: Not Currently   Other Topics Concern  . Not on file   Social History Narrative    Review of Systems: Review of Systems  Constitutional: Negative for fever and chills.  HENT: Negative for sore throat.   Eyes: Negative for blurred vision.  Respiratory: Positive for cough and shortness of breath. Negative for sputum production.   Cardiovascular: Positive for leg swelling. Negative for chest pain.  Gastrointestinal: Positive for nausea, vomiting and abdominal pain.  Musculoskeletal: Negative for myalgias and joint pain.  Neurological: Negative for dizziness and headaches.  The other systems in the 10 point ROS were negative   Physical Exam: Blood pressure 214/136, pulse 101, temperature 99 F (37.2 C), resp. rate 29, height 6' (1.829 m), weight 104.781 kg (231  lb), SpO2 100 %. GENERAL- alert, uncomfortable in bipap HEENT- Atraumatic, PERRL, no cervical lymphadenopathy, JVD not appreciated but patient upright and lrge beefy neck CARDIAC- RRR, no murmurs,  rubs or gallops. RESP- Coarse breath sounds across b/l lung fields, no wheezes. ABDOMEN- Obese, mild R sided TTP, no guarding or rebound tenderness, BS+ throughout BACK- No CVA tenderness. NEURO- No obvious Cr N abnormality, strength upper and lower extremities- 5/5, symmetric neuropathy of b/l feet EXTREMITIES- 1+ pitting pedal edema b/l SKIN- Warm, dry, No rash or lesion. PSYCH- Agitated and uncomfortable   Lab results: Basic Metabolic Panel:  Recent Labs  09/19/15 0305  NA 139  K 3.4*  CL 100*  CO2 20*  GLUCOSE 271*  BUN 60*  CREATININE 3.51*  CALCIUM 8.7*   Liver Function Tests: No results for input(s): AST, ALT, ALKPHOS, BILITOT, PROT, ALBUMIN in the last 72 hours. No results for input(s): LIPASE, AMYLASE in the last 72 hours. No results for input(s): AMMONIA in the last 72 hours. CBC:  Recent Labs  09/19/15 0305  WBC 9.0  NEUTROABS 7.9*  HGB 13.6  HCT 40.9  MCV 85.9  PLT 185   Cardiac Enzymes: No results for input(s): CKTOTAL, CKMB, CKMBINDEX, TROPONINI in the last 72 hours. BNP: No results for input(s): PROBNP in the last 72 hours. D-Dimer: No results for input(s): DDIMER in the last 72 hours. CBG: No results for input(s): GLUCAP in the last 72 hours. Hemoglobin A1C: No results for input(s): HGBA1C in the last 72 hours. Fasting Lipid Panel: No results for input(s): CHOL, HDL, LDLCALC, TRIG, CHOLHDL, LDLDIRECT in the last 72 hours. Thyroid Function Tests: No results for input(s): TSH, T4TOTAL, FREET4, T3FREE, THYROIDAB in the last 72 hours. Anemia Panel: No results for input(s): VITAMINB12, FOLATE, FERRITIN, TIBC, IRON, RETICCTPCT in the last 72 hours. Coagulation: No results for input(s): LABPROT, INR in the last 72 hours. Urine Drug Screen: Drugs of Abuse     Component Value Date/Time   LABOPIA NONE DETECTED 07/03/2012 2346   COCAINSCRNUR NONE DETECTED 07/03/2012 2346   LABBENZ NONE DETECTED 07/03/2012 2346   AMPHETMU NONE DETECTED 07/03/2012  2346   THCU NONE DETECTED 07/03/2012 2346   LABBARB NONE DETECTED 07/03/2012 2346    Alcohol Level: No results for input(s): ETH in the last 72 hours. Urinalysis: No results for input(s): COLORURINE, LABSPEC, PHURINE, GLUCOSEU, HGBUR, BILIRUBINUR, KETONESUR, PROTEINUR, UROBILINOGEN, NITRITE, LEUKOCYTESUR in the last 72 hours.  Invalid input(s): APPERANCEUR   Imaging results:  Dg Chest Portable 1 View  09/19/2015  CLINICAL DATA:  Shortness of breath tonight. EXAM: PORTABLE CHEST 1 VIEW COMPARISON:  12/07/2014 FINDINGS: Cardiomegaly and mediastinal contours are unchanged. Development of multifocal bilateral pulmonary opacities. Ill-defined patchy opacities in the perihilar regions, with the more confluent right upper lung zone opacity. No large pleural effusion. No pneumothorax. IMPRESSION: Multifocal bilateral pulmonary opacities. Pulmonary edema versus infection, or combination of both. Electronically Signed   By: Jeb Levering M.D.   On: 09/19/2015 03:27    Other results: EKG: L axis deviation, ST segment elevation and reciprocal T wave inversions noted but also present on previous tracings from 2016  Assessment & Plan by Problem: Hypertensive emergency Blood pressure in [id number] systolic in ED requiring repeated titration up of nitro drip for response. History of past episode of hypertensive emergency. Not entirely clear what precipitating factor is at this time, maybe viral infection? Does have history of medication noncompliance and refusal of therapies in the past. -Nitro gtt  -Lasix IV -Consult [name] for  evaluation and further management recommendations  Acute hypoxic respiratory failure Patient presenting with most likely respiratory failure from pulmonary edema 2/2 hypertensive emergency. His overall volume status is difficult to assess but does have some peripheral edema and weight and BNP are above baseline. Picture does not fit that well with a bacterial pneumonia but may have  viral syndrome with his preceding history of cough, nausea, vomiting. Also possible aspiration with history of nausea/vomiting? No fever or leukocytosis. -Continuous Bipap -Lasix 80mg  IV TID -Continuous pulse Ox -Repeat 2 view CXR later today -Respiratory virus panel, influenza PCR -Repeat CBC, check PCT -1 dose abtx in ED azithro/CTX, hold further cont if febrile, productive sputum, etc or labs indicate  Combined mild systolic/grade I diastolic CHF Presenting with pulm edema, mild peripheral edema, severe hypertension, not sure of trigger for exacerbation. BNP 1844 higher than past levels. Trops 0.34 LA 2.56 likely demand ischemia and hypoxia. -Resume home coreg, clonidine, hydralazine -Hold imdur while on nitro gtt, transition once weaning with improvement -Repeat EKG -Lactic acidosis 2.56  AKI on CKD SCr 3.51 with low UOP at presentation baseline appears ~2.4 range. Probably from HTN but consider intravascular depletion if failure to improve/worsening with diuresis and lowering BP. -F/U repeat chem panel, Mag, Phos -K-Cl x3 runs  T2DM On lantus 30U qhs at home. Last A1cs in 5-6% range. CBGs in 200s at presentation. -Decreased lantus dose w/ NPO, AKI -SSI-R  Nausea, vomiting, abdominal pain Possibly viral infectious etiology. QTc 485 on EKG. -Phenergan q6hr PRN -Senna-docusate qhs  Hx of CVA 2014 R basal ganglia -Plavix -Pravastatin  Diet: NPO VTE ppx: [place] enoxparin FULL CODE  Dispo: Disposition is deferred at this time, awaiting improvement of current medical problems. Anticipated discharge in approximately 2-4 day(s).   The patient does have a current PCP Lucious Groves, DO) and does need an [facility] hospital follow-up appointment after discharge.  The patient does have transportation limitations that hinder transportation to clinic appointments.  Signed: Collier Salina, MD 09/19/2015, 5:14 AM

## 2015-09-19 NOTE — ED Notes (Signed)
Pt refuses rectal temp

## 2015-09-19 NOTE — ED Provider Notes (Signed)
CSN: [id number]     Arrival date & time 09/19/15  0253 History  By signing my name below, I, Randa Evens, attest that this documentation has been prepared under the direction and in the presence of Ripley Fraise, MD. Electronically Signed: Randa Evens, ED Scribe. 09/19/2015. 2:54 AM.    Chief Complaint  Patient presents with  . Respiratory Distress   The history is provided by the EMS personnel. No language interpreter was used.   HPI Comments: Level 5 Caveat: acuity of condition  Charles Hart is a 60 y.o. male brought in by ambulance, who presents to the Emergency Department complaining of SOB onset PTA. Pt intially called EMS for flu like symptoms. Ems states that on arrival his O2 was 71%. Ems states that he was placed on CPAP at 7.5L and O2 improved to around 80%. Ems states that pt reported nausea in route and was given zofran that provided relief. Ems reports elevated BP of 220/146.  No other details are known at this time.   Past Medical History  Diagnosis Date  . Dermatitis   . CHF (congestive heart failure) (HCC)     LV function improved from 2004 to 2008.  Historically, moderately dilated LV with EF 30-40% by 2D echo 08/14/2002.  Mild CAD with severe LV dysfunction by cardiac cath 09/2002.  Normal coronary arteries and normal LV function by cardiac cath 09/19/2006.  A 2-D echo on 04/01/2009 showed mild concentric hypertrophy and normal systolic (LVEF  123456) and doppler C/W with grade 1 diastolic dysfunction..   . Hypertension   . Hyperlipidemia   . Hearing loss in right ear   . Cardiomyopathy     LV function improved from 2004 to 2008.  Historically, moderately dilated LV with EF 30-40% by 2D echo 08/14/2002.  Mild CAD with severe LV dysfunction by cardiac cath 09/2002.  Normal coronary arteries and normal LV function by cardiac cath 09/19/2006.  A 2-D echo on 04/01/2009 showed mild concentric hypertrophy and normal systolic (LVEF  123456) and doppler C/W with grade 1  diastolic dysfunction.  . DM neuropathy, painful ([place])   . CVA (cerebral vascular accident) ([place]) 07/04/2012    MRI of the brain 07/04/2012 showed an acute infarct in the right basal ganglia involving the anterior putamen, anterior limb internal capsule, and head of the caudate; this measured approximately 2.5 cm in diameter.     . Hypertensive crisis 07/28/2012  . Myocardial infarction ([place])   . Diabetes mellitus     type 2  . Hypertensive urgency 08/20/2014  . Nephrotic syndrome 02/18/2013    A 24-hour urine collection 03/04/2013 showed total protein of 5,460 g and creatinine clearance of 80 mL/minute.  Patient was seen by Seward Meth at [facility] and a repeat 24-hour urine showed 10,407 mg protein.  Patient underwent kidney biopsy on 05/30/2013; pathology showed advanced diffuse and nodular diabetic nephropathy with vascular changes consistent with long-standing difficult to control hypertension.     . Adenomatous colon polyp 07/02/2011    Last colonoscopy May 06, 2011 by Dr. Owens Loffler, who recommended repeat colonoscopy in 5 years.   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 Background diabetic retinopathy 04/20/2012    Patient is followed by Dr. Katy Fitch   . Chronic combined systolic and diastolic congestive heart failure ([place]) 05/21/2010    LV function improved from 2004 to 2008.  Historically, moderately dilated LV with  EF 30-40% by 2D echo 08/14/2002.  Mild CAD with severe LV dysfunction by cardiac cath 09/2002.  Normal coronary arteries and normal LV function by cardiac cath 09/19/2006.  A 2-D echo on 04/01/2009 showed mild concentric hypertrophy and normal systolic (LVEF  123456) and doppler parameters consistent with abnormal left    Past Surgical History  Procedure Laterality Date  . Colonoscopy    . Polypectomy    . Cardiac catheterization      3 times  . Foot surgery     Family History   Problem Relation Age of Onset  . Colon cancer Sister   . Aneurysm Father 24    died of rupture   Social History  Substance Use Topics  . Smoking status: Never Smoker   . Smokeless tobacco: Never Used  . Alcohol Use: No     Comment: Wine occasionally (no more than 2 glasses per month)    Review of Systems  Unable to perform ROS: Acuity of condition      Allergies  Amlodipine and Lisinopril  Home Medications   Prior to Admission medications   Medication Sig Start Date End Date Taking? Authorizing Provider  acetaminophen (TYLENOL) 500 MG tablet Take 500 mg by mouth every 6 (six) hours as needed (pain).     Historical Provider, MD  BD PEN NEEDLE NANO U/F 32G X 4 MM MISC USE TO INJECT LANTUS ONCE A DAY AND NOVOLOG THREE TIMES DAILY 06/16/15   Lucious Groves, DO  carvedilol (COREG) 12.5 MG tablet Take 1 tablet (12.5 mg total) by mouth 2 (two) times daily. 01/05/15   Larey Dresser, MD  cloNIDine (CATAPRES) 0.3 MG tablet Take 1 tablet (0.3 mg total) by mouth 2 (two) times daily. 12/25/14   Lucious Groves, DO  clopidogrel (PLAVIX) 75 MG tablet Take 1 tablet (75 mg total) by mouth daily. 11/26/14   Lucious Groves, DO  fluticasone (FLONASE) 50 MCG/ACT nasal spray Place 2 sprays into both nostrils daily as needed for allergies or rhinitis.    Historical Provider, MD  furosemide (LASIX) 40 MG tablet Take 1 tablet (40 mg total) by mouth 2 (two) times daily. 02/03/15   Lucious Groves, DO  gabapentin (NEURONTIN) 300 MG capsule TAKE 1 CAPSULE(300 MG) BY MOUTH EVERY MORNING 06/16/15   Lucious Groves, DO  guaiFENesin-codeine (ROBITUSSIN AC) 100-10 MG/5ML syrup Take 5 mLs by mouth 3 (three) times daily as needed for cough. 07/02/15   Lucious Groves, DO  hydrALAZINE (APRESOLINE) 50 MG tablet TAKE 2 TABLETS BY MOUTH THREE TIMES DAILY 08/24/15   Sid Falcon, MD  Insulin Glargine (LANTUS SOLOSTAR) 100 UNIT/ML Solostar Pen Inject 30 Units into the skin daily at 10 pm. 07/02/15   Lucious Groves, DO   isosorbide mononitrate (IMDUR) 60 MG 24 hr tablet TAKE 1 TABLET(60 MG) BY MOUTH DAILY 07/02/15   Lucious Groves, DO  Multiple Vitamin (MULITIVITAMIN WITH MINERALS) TABS Take 1 tablet by mouth every morning.     Historical Provider, MD  NOVOLOG FLEXPEN 100 UNIT/ML FlexPen Inject 2-7 Units into the skin daily.  11/25/14   Historical Provider, MD  ONE TOUCH ULTRA TEST test strip USE TO TEST BLOOD SUGAR THREE TIMES DAILY 07/16/15   Lucious Groves, DO  ONETOUCH DELICA LANCETS 99991111 MISC USE TO TEST BLOOD SUGAR THREE TIMES DAILY 07/16/15   Lucious Groves, DO  pravastatin (PRAVACHOL) 40 MG tablet Take 1 tablet (40 mg total) by mouth daily. 11/26/14  Lucious Groves, DO   BP 219/128 mmHg  Pulse 102  Temp(Src) 99 F (37.2 C)  Resp 25  Ht 6' (1.829 m)  Wt 231 lb (104.781 kg)  BMI 31.32 kg/m2  SpO2 97%   Physical Exam CONSTITUTIONAL: ill appearing, agitated HEAD: Normocephalic/atraumatic ENMT: CPAP mask in place  NECK: supple no meningeal signs SPINE/BACK:entire spine nontender CV: S1/S2 noted, no murmurs/rubs/gallops noted LUNGS: tachypneic and crackles bilaterally  ABDOMEN: soft, nontender, no rebound or guarding, bowel sounds noted throughout abdomen NEURO: Pt is awake/alert/appropriate, moves all extremitiesx4. EXTREMITIES: pulses normal/equal, full ROM SKIN: warm, color normal PSYCH: unable to assess  ED Course  Procedures  CRITICAL CARE Performed by: Sharyon Cable Total critical care time: 35 minutes Critical care time was exclusive of separately billable procedures and treating other patients. Critical care was necessary to treat or prevent imminent or life-threatening deterioration. Critical care was time spent personally by me on the following activities: development of treatment plan with patient and/or surrogate as well as nursing, discussions with consultants, evaluation of patient's response to treatment, examination of patient, obtaining history from patient or surrogate,  ordering and performing treatments and interventions, ordering and review of laboratory studies, ordering and review of radiographic studies, pulse oximetry and re-evaluation of patient's condition. PATIENT IS ON BIPAP, HE HAS RESPIRATORY FAILURE AND HE IS ON NITROGLYCERIN DRIP DIAGNOSTIC STUDIES: Oxygen Saturation is 97% on [place], normal by my interpretation.    COORDINATION OF CARE: 2:59 AM-Discussed treatment plan with pt at bedside and pt agreed to plan.   4:23 AM  Pt with hypertensive emergency He also has respiratory failure requiring bipap.  HE HAS COMBINATION OF PNEUMONIA AND ALSO CHF/PULMONARY EDEMA He is improved He is on NTG drip Will also add on antibiotics for possible pneumonia and blood cultures ordered D/w internal medicine, will admit to stepdown unit   Labs Review Labs Reviewed  BASIC METABOLIC PANEL - Abnormal; Notable for the following:    Potassium 3.4 (*)    Chloride 100 (*)    CO2 20 (*)    Glucose, Bld 271 (*)    BUN 60 (*)    Creatinine, Ser 3.51 (*)    Calcium 8.7 (*)    GFR calc non Af Amer 18 (*)    GFR calc Af Amer 21 (*)    Anion gap 19 (*)    All other components within normal limits  CBC WITH DIFFERENTIAL/PLATELET - Abnormal; Notable for the following:    Neutro Abs 7.9 (*)    All other components within normal limits  I-STAT TROPOININ, ED - Abnormal; Notable for the following:    Troponin i, poc 0.34 (*)    All other components within normal limits  I-STAT ARTERIAL BLOOD GAS, ED - Abnormal; Notable for the following:    pO2, Arterial 78.0 (*)    All other components within normal limits  I-STAT CG4 LACTIC ACID, ED - Abnormal; Notable for the following:    Lactic Acid, Venous 2.56 (*)    All other components within normal limits  CULTURE, BLOOD (ROUTINE X 2)  CULTURE, BLOOD (ROUTINE X 2)  BRAIN NATRIURETIC PEPTIDE  INFLUENZA PANEL BY PCR (TYPE A & B, H1N1)    Imaging Review Dg Chest Portable 1 View  09/19/2015  CLINICAL DATA:  Shortness  of breath tonight. EXAM: PORTABLE CHEST 1 VIEW COMPARISON:  12/07/2014 FINDINGS: Cardiomegaly and mediastinal contours are unchanged. Development of multifocal bilateral pulmonary opacities. Ill-defined patchy opacities in the perihilar regions, with the more  confluent right upper lung zone opacity. No large pleural effusion. No pneumothorax. IMPRESSION: Multifocal bilateral pulmonary opacities. Pulmonary edema versus infection, or combination of both. Electronically Signed   By: Jeb Levering M.D.   On: 09/19/2015 03:27      EKG Interpretation   Date/Time:  Saturday September 19 2015 03:14:00 EST Ventricular Rate:  102 PR Interval:  146 QRS Duration: 116 QT Interval:  372 QTC Calculation: 485 R Axis:   25 Text Interpretation:  Sinus tachycardia Multiple premature complexes, vent  & supraven Probable left atrial enlargement LVH with IVCD and secondary  repol abnrm Anterior ST elevation, probably due to LVH Borderline  prolonged QT interval Abnormal ekg No significant change since last  tracing Confirmed by Christy Gentles  MD, Elenore Rota ([id number]) on 09/19/2015 3:26:12 AM     Medications  nitroGLYCERIN 50 mg in dextrose 5 % 250 mL (0.2 mg/mL) infusion (50 mcg/min Intravenous Rate/Dose Verify 09/19/15 0414)  cefTRIAXone (ROCEPHIN) 1 g in dextrose 5 % 50 mL IVPB (not administered)  azithromycin (ZITHROMAX) 500 mg in dextrose 5 % 250 mL IVPB (not administered)  furosemide (LASIX) injection 40 mg (40 mg Intravenous Given 09/19/15 0311)  ondansetron (ZOFRAN) injection 4 mg (4 mg Intravenous Given 09/19/15 0310)  ondansetron (ZOFRAN) injection 4 mg (4 mg Intravenous Given 09/19/15 0330)    MDM   Final diagnoses:  Acute respiratory failure with hypoxia (HCC)  Hypertensive emergency  Renal failure  Acute pulmonary edema ([place])      Nursing notes including past medical history and social history reviewed and considered in documentation xrays/imaging reviewed by myself and considered during  evaluation Labs/vital reviewed myself and considered during evaluation   I personally performed the services described in this documentation, which was scribed in my presence. The recorded information has been reviewed and is accurate.       Ripley Fraise, MD 09/19/15 [phone]

## 2015-09-19 NOTE — ED Notes (Signed)
Attempted report, stepdown unable to take pt due to nitro drip and unstable BP, admitting paged.

## 2015-09-19 NOTE — ED Notes (Signed)
Spoke with admitting, pt to go to ICU.

## 2015-09-19 NOTE — Progress Notes (Signed)
MD took pt off of BiPAP, RT to monitor as needed

## 2015-09-20 ENCOUNTER — Inpatient Hospital Stay (HOSPITAL_COMMUNITY): Payer: PPO

## 2015-09-20 ENCOUNTER — Other Ambulatory Visit: Payer: Self-pay | Admitting: Internal Medicine

## 2015-09-20 DIAGNOSIS — I161 Hypertensive emergency: Secondary | ICD-10-CM | POA: Diagnosis not present

## 2015-09-20 DIAGNOSIS — I5043 Acute on chronic combined systolic (congestive) and diastolic (congestive) heart failure: Secondary | ICD-10-CM | POA: Diagnosis not present

## 2015-09-20 DIAGNOSIS — J9601 Acute respiratory failure with hypoxia: Secondary | ICD-10-CM | POA: Diagnosis not present

## 2015-09-20 DIAGNOSIS — J101 Influenza due to other identified influenza virus with other respiratory manifestations: Secondary | ICD-10-CM | POA: Diagnosis present

## 2015-09-20 DIAGNOSIS — J811 Chronic pulmonary edema: Secondary | ICD-10-CM | POA: Diagnosis not present

## 2015-09-20 DIAGNOSIS — N179 Acute kidney failure, unspecified: Secondary | ICD-10-CM | POA: Diagnosis not present

## 2015-09-20 LAB — RENAL FUNCTION PANEL
Albumin: 2.8 g/dL — ABNORMAL LOW (ref 3.5–5.0)
Anion gap: 13 (ref 5–15)
BUN: 80 mg/dL — ABNORMAL HIGH (ref 6–20)
CALCIUM: 7.6 mg/dL — AB (ref 8.9–10.3)
CO2: 27 mmol/L (ref 22–32)
CREATININE: 4.13 mg/dL — AB (ref 0.61–1.24)
Chloride: 96 mmol/L — ABNORMAL LOW (ref 101–111)
GFR, EST AFRICAN AMERICAN: 17 mL/min — AB (ref >60)
GFR, EST NON AFRICAN AMERICAN: 15 mL/min — AB (ref >60)
Glucose, Bld: 122 mg/dL — ABNORMAL HIGH (ref 65–99)
Phosphorus: 5 mg/dL — ABNORMAL HIGH (ref 2.5–4.6)
Potassium: 3.1 mmol/L — ABNORMAL LOW (ref 3.5–5.1)
SODIUM: 136 mmol/L (ref 135–145)

## 2015-09-20 LAB — CBC WITH DIFFERENTIAL/PLATELET
BASOS ABS: 0 10*3/uL (ref 0.0–0.1)
BASOS PCT: 0 %
EOS ABS: 0 10*3/uL (ref 0.0–0.7)
Eosinophils Relative: 0 %
HCT: 29.5 % — ABNORMAL LOW (ref 39.0–52.0)
HEMOGLOBIN: 10 g/dL — AB (ref 13.0–17.0)
Lymphocytes Relative: 15 %
Lymphs Abs: 0.8 10*3/uL (ref 0.7–4.0)
MCH: 28.8 pg (ref 26.0–34.0)
MCHC: 33.9 g/dL (ref 30.0–36.0)
MCV: 85 fL (ref 78.0–100.0)
Monocytes Absolute: 0.4 10*3/uL (ref 0.1–1.0)
Monocytes Relative: 7 %
NEUTROS PCT: 77 %
Neutro Abs: 4.1 10*3/uL (ref 1.7–7.7)
Platelets: 156 10*3/uL (ref 150–400)
RBC: 3.47 MIL/uL — AB (ref 4.22–5.81)
RDW: 12.9 % (ref 11.5–15.5)
WBC: 5.4 10*3/uL (ref 4.0–10.5)

## 2015-09-20 LAB — EXPECTORATED SPUTUM ASSESSMENT W REFEX TO RESP CULTURE

## 2015-09-20 LAB — MAGNESIUM: MAGNESIUM: 2.2 mg/dL (ref 1.7–2.4)

## 2015-09-20 LAB — EXPECTORATED SPUTUM ASSESSMENT W GRAM STAIN, RFLX TO RESP C

## 2015-09-20 LAB — GLUCOSE, CAPILLARY
GLUCOSE-CAPILLARY: 130 mg/dL — AB (ref 65–99)
GLUCOSE-CAPILLARY: 95 mg/dL (ref 65–99)
Glucose-Capillary: 93 mg/dL (ref 65–99)

## 2015-09-20 MED ORDER — POTASSIUM CHLORIDE CRYS ER 20 MEQ PO TBCR
40.0000 meq | EXTENDED_RELEASE_TABLET | Freq: Two times a day (BID) | ORAL | Status: DC
  Administered 2015-09-20: 40 meq via ORAL
  Filled 2015-09-20: qty 2

## 2015-09-20 MED ORDER — HYDRALAZINE HCL 50 MG PO TABS
50.0000 mg | ORAL_TABLET | Freq: Three times a day (TID) | ORAL | Status: DC
  Administered 2015-09-20 – 2015-09-21 (×4): 50 mg via ORAL
  Filled 2015-09-20 (×3): qty 1

## 2015-09-20 MED ORDER — CLONIDINE HCL 0.2 MG PO TABS
0.2000 mg | ORAL_TABLET | Freq: Two times a day (BID) | ORAL | Status: DC
  Administered 2015-09-20 – 2015-09-21 (×2): 0.2 mg via ORAL
  Filled 2015-09-20 (×2): qty 1

## 2015-09-20 MED ORDER — CLONIDINE HCL 0.2 MG PO TABS: 0.3000 mg | ORAL_TABLET | Freq: Two times a day (BID) | ORAL | Status: DC

## 2015-09-20 NOTE — Discharge Summary (Signed)
Name: Charles Hart MRN: [name] DOB: 1956/05/03 60 y.o. PCP: Lucious Groves, DO  Date of Admission: 09/19/2015  2:54 AM Date of Discharge: 09/21/2015 Attending Physician: Thayer Headings, MD  Discharge Diagnosis: 1. Hypertensive emergency likely from rebound hypertension from missed clonidine 2. Acute hypoxic respiratory failure from influenza  Discharge Medications:   Medication List    STOP taking these medications        guaiFENesin-codeine 100-10 MG/5ML syrup  Commonly known as:  ROBITUSSIN AC     multivitamin with minerals Tabs tablet      TAKE these medications        acetaminophen 500 MG tablet  Commonly known as:  TYLENOL  Take 1,000 mg by mouth every 6 (six) hours as needed (pain).     BD PEN NEEDLE NANO U/F 32G X 4 MM Misc  Generic drug:  Insulin Pen Needle  USE TO INJECT LANTUS ONCE A DAY AND NOVOLOG THREE TIMES DAILY     CARTIA XT 180 MG 24 hr capsule  Generic drug:  diltiazem  Take 180 mg by mouth daily.     carvedilol 12.5 MG tablet  Commonly known as:  COREG  Take 1 tablet (12.5 mg total) by mouth 2 (two) times daily.     cloNIDine 0.1 MG tablet  Commonly known as:  CATAPRES  Take 2 tabs daily for 3 days, then 1 tab daily until we see you in clinic     clopidogrel 75 MG tablet  Commonly known as:  PLAVIX  Take 1 tablet (75 mg total) by mouth daily.     fluticasone 50 MCG/ACT nasal spray  Commonly known as:  FLONASE  Place 2 sprays into both nostrils daily as needed for allergies or rhinitis.     furosemide 40 MG tablet  Commonly known as:  LASIX  Take 1 tablet ONLY if your legs start to swell OR your weight increases by 2 pounds     gabapentin 300 MG capsule  Commonly known as:  NEURONTIN  TAKE 1 CAPSULE(300 MG) BY MOUTH EVERY MORNING     hydrALAZINE 50 MG tablet  Commonly known as:  APRESOLINE  TAKE 2 TABLETS BY MOUTH THREE TIMES DAILY     Insulin Glargine 100 UNIT/ML Solostar Pen  Commonly known as:  LANTUS SOLOSTAR  Inject 30  Units into the skin daily at 10 pm.     isosorbide mononitrate 60 MG 24 hr tablet  Commonly known as:  IMDUR  TAKE 1 TABLET(60 MG) BY MOUTH DAILY     NOVOLOG FLEXPEN 100 UNIT/ML FlexPen  Generic drug:  insulin aspart  Inject 7 Units into the skin 3 (three) times daily with meals.     ONE TOUCH ULTRA TEST test strip  Generic drug:  glucose blood  USE TO TEST BLOOD SUGAR THREE TIMES DAILY     ONETOUCH DELICA LANCETS 99991111 Misc  USE TO TEST BLOOD SUGAR THREE TIMES DAILY     oseltamivir 30 MG capsule  Commonly known as:  TAMIFLU  Take 1 capsule daily until 2/8     pravastatin 40 MG tablet  Commonly known as:  PRAVACHOL  Take 1 tablet (40 mg total) by mouth daily.     senna-docusate 8.6-50 MG tablet  Commonly known as:  Senokot-S  Take 1 tablet by mouth at bedtime as needed for mild constipation.        Disposition and follow-up:   Charles Hart was discharged from [facility] in  Good condition.  At the hospital follow up visit please address:  1. Check a BMP at next clinic visit to assess renal function  2. Try to taper off clonidine if he is doing well on 0.1mg  daily on discharge  3. Consider starting ACEi if his creatinine stabilizes given his HFrEF    Follow-up Appointments:  Follow-up in our clininc on 2/12 at 1015 with Dr. Heber [place]  Consultations:  Treatment Team:  Md [name], MD  Procedures Performed:  Dg Chest 2 View  09/20/2015  CLINICAL DATA:  Pulmonary edema EXAM: CHEST  2 VIEW COMPARISON:  09/19/2015 FINDINGS: Normal cardiac silhouette. There is improvement in fine airspace disease seen on prior. No infiltrate. No pneumothorax. IMPRESSION: Improvement in pulmonary edema pattern. Electronically Signed   By: Suzy Bouchard M.D.   On: 09/20/2015 08:38   Dg Chest Port 1 View  09/19/2015  CLINICAL DATA:  Flash pulmonary edema. EXAM: PORTABLE CHEST 1 VIEW COMPARISON:  09/19/2015 at 0304 hours FINDINGS: The cardiac silhouette remains enlarged.  Patchy bilateral perihilar and basilar lung opacities and confluent opacity in the right upper lung are stable to minimally improved. No sizable pleural effusion or pneumothorax is identified. IMPRESSION: Stable to minimally improved lung opacities which may reflect edema or infection. Electronically Signed   By: Logan Bores M.D.   On: 09/19/2015 07:37   Dg Chest Portable 1 View  09/19/2015  CLINICAL DATA:  Shortness of breath tonight. EXAM: PORTABLE CHEST 1 VIEW COMPARISON:  12/07/2014 FINDINGS: Cardiomegaly and mediastinal contours are unchanged. Development of multifocal bilateral pulmonary opacities. Ill-defined patchy opacities in the perihilar regions, with the more confluent right upper lung zone opacity. No large pleural effusion. No pneumothorax. IMPRESSION: Multifocal bilateral pulmonary opacities. Pulmonary edema versus infection, or combination of both. Electronically Signed   By: Jeb Levering M.D.   On: 09/19/2015 03:27    Admission HPI:   60 y/o man with past medical history of combined diastolic/systolic congestive heart failure, CAD, CVA of right basal ganglia 2014, diabetes, CKD stage III, hypertension with history of hypertensive emergency presented to the emergency department via EMS after calling from home due to worsening shortness of breath. He was feeling usual health until approximately 2-3 days ago when he developed cough, nausea, and vomiting that worsened over the interval. He was able to tolerate fluid intake but not eat. He also felt a gradual onset and worsening shortness of breath as well as a mostly nonproductive cough.  On arrival EMS noted him to be hypoxic to 70%s and partially improved with cPAP to 80s for transport to ED. In Ed he was placed on Bipap with 50% FiO2 with improvement in saturation. Blood pressured noted to be [id number] systolic and he was placed on nitro infusion. CXR was obtained showing diffuse bilateral infiltrates and started on initial dose of IV abtx  and lasix.  He denies chest pain, or fevers/chills during this time. He does not report any recent change to his medications or inability to take them as directed. He does not recall any other precipitating events and denies other sick contacts at home.  Hospital Course by problem list:   1. Hypertensive emergency likely from rebound hypertension from missed clonidine: He presented with blood pressures greater than 220 in the emergency room with a jump in his creatinine from 2.5-3.5. The 2 days prior, he had not been taking any of his antihypertensives, including his clonidine. He was started on a nitroglycerin drip that was weaned off the following day. He  was then normotensive on the above regimen upon discharge. I sent him home on a slow clonidine taper from 0.2mg  daily for 3 days (dosed daily given his poor renal function), then 0.1mg  daily until he is seen in clinic, to hopefully avoid rebound hypertension but get the ball rolling on tapering off.   2. Acute hypoxic respiratory failure from influenza: He presented with a 2 day history of cough, myalgias, and malaise, and was found to be influenza positive. He was started on BiPAP overnight, and the following day was oxygenating well on room air. He was continued on oseltamivir renally dosed at 30 mg daily until 2/10.   3. Acute kidney injury from aggressive diuresis: On presentation, there was concern for heart failure exacerbation so he was aggressively diuresed and his creatinine rose frmo 3.5 to 4.5 He was gently re-hydrated and his creatinine improved slightly back to 3.5. He was discharged on furosemide 40mg  daily as needed for leg swelling or weight gain. He'll need another BMP checked in clinic next week.  Discharge Vitals:   BP 138/68 mmHg  Pulse 72  Temp(Src) 97.7 F (36.5 C) (Oral)  Resp 16  Ht 6' (1.829 m)  Wt 223 lb 12.3 oz (101.5 kg)  BMI 30.34 kg/m2  SpO2 96%  Discharge Labs:  Results for orders placed or performed during  the hospital encounter of 09/19/15 (from the past 24 hour(s))  Glucose, capillary     Status: Abnormal   Collection Time: 09/21/15 11:44 AM  Result Value Ref Range   Glucose-Capillary 106 (H) 65 - 99 mg/dL   Comment 1 Notify RN    Comment 2 Document in Chart   Basic metabolic panel     Status: Abnormal   Collection Time: 09/21/15 12:21 PM  Result Value Ref Range   Sodium 136 135 - 145 mmol/L   Potassium 3.2 (L) 3.5 - 5.1 mmol/L   Chloride 97 (L) 101 - 111 mmol/L   CO2 25 22 - 32 mmol/L   Glucose, Bld 125 (H) 65 - 99 mg/dL   BUN 83 (H) 6 - 20 mg/dL   Creatinine, Ser 3.84 (H) 0.61 - 1.24 mg/dL   Calcium 7.7 (L) 8.9 - 10.3 mg/dL   GFR calc non Af Amer 16 (L) >60 mL/min   GFR calc Af Amer 18 (L) >60 mL/min   Anion gap 14 5 - 15    Signed: [name], MD 09/22/2015, 11:33 AM

## 2015-09-20 NOTE — Progress Notes (Signed)
Patient ID: Charles Hart, male   DOB: [birth year], 60 y.o.   MRN: [name]   Subjective: Charles Hart is a 60 year old man being transferred back to our service today from the ICU. On 2/4, he presented to the emergency room in acute hypoxic respiratory failure and hypertensive emergency. In the ICU, he was started on a nitroglycerin drip, and his blood pressures have since normalized. He was also placed on BiPap for the night but is now oxygenating well on 2L oxygen. He was positive for influenza, and his hypertensive emergency was likely precipitated by not taking his antihypertensives, including clonidine, for the past few days because he was feeling unwell.  This morning, he still has a slightly productive cough but feels drastically better than two days ago. He feels comfortable going home and can get his clonidine pills this morning. His grandson who is 58 weeks old as having open heart surgery in [place], and he would like to be home for the procedure.   Objective: Vital signs in last 24 hours: Filed Vitals:   09/19/15 2001 09/19/15 2215 09/19/15 2316 09/20/15 0432  BP: 107/61 133/71 130/65 139/75  Pulse: 66  70 78  Temp: 97.7 F (36.5 C)  97.8 F (36.6 C) 97.7 F (36.5 C)  TempSrc: Axillary  Axillary Axillary  Resp: 24  35 22  Height:      Weight:    101.1 kg (222 lb 14.2 oz)  SpO2: 94%  92% 98%   Physical exam: General: very nice man resting in bed comfortably HEENT: arcus senilis, oropharynx without lesions Cardiac: regular rate and rhythm, no rubs, murmurs or gallops Pulm: breathing well, clear to auscultation bilaterally Abd: bowel sounds normal, soft, nondistended, non-tender Ext: warm and well perfused, with trace pedal edema Lymph: no cervical or supraclavicular lymphadenopathy  Lab Results: Basic Metabolic Panel:  Recent Labs Lab 09/19/15 0620 09/19/15 0927 09/20/15 0436  NA  --  140 136  K  --  3.9 3.1*  CL  --  100* 96*  CO2  --  21* 27  GLUCOSE  --   286* 122*  BUN  --  65* 80*  CREATININE  --  3.66* 4.13*  CALCIUM  --  8.0* 7.6*  MG 1.5*  --  2.2  PHOS 4.6  --  5.0*   Liver Function Tests:  Recent Labs Lab 09/19/15 0620 09/20/15 0436  AST 31  --   ALT 15*  --   ALKPHOS 41  --   BILITOT 0.9  --   PROT 6.1*  --   ALBUMIN 2.8* 2.8*   CBC:  Recent Labs Lab 09/19/15 0305 09/20/15 0436  WBC 9.0 5.4  NEUTROABS 7.9* 4.1  HGB 13.6 10.0*  HCT 40.9 29.5*  MCV 85.9 85.0  PLT 185 156   Cardiac Enzymes:  Recent Labs Lab 09/19/15 0927 09/19/15 1522  TROPONINI 0.29* 0.31*   CBG:  Recent Labs Lab 09/19/15 0815 09/19/15 1133 09/19/15 1728 09/19/15 2134  GLUCAP 269* 176* 116* 151*   Urine Drug Screen: Drugs of Abuse     Component Value Date/Time   LABOPIA POSITIVE* 09/19/2015 1550   COCAINSCRNUR NONE DETECTED 09/19/2015 1550   LABBENZ NONE DETECTED 09/19/2015 1550   AMPHETMU NONE DETECTED 09/19/2015 1550   THCU NONE DETECTED 09/19/2015 1550   LABBARB NONE DETECTED 09/19/2015 1550    Urinalysis:  Recent Labs Lab 09/19/15 [place]  LABSPEC 1.012  PHURINE 5.0  GLUCOSEU [place]  NEGATIVE  KETONESUR NEGATIVE  PROTEINUR >300*  NITRITE NEGATIVE  LEUKOCYTESUR NEGATIVE    Micro Results: Recent Results (from the past 240 hour(s))  MRSA PCR Screening     Status: None   Collection Time: 09/19/15  7:19 AM  Result Value Ref Range Status   MRSA by PCR NEGATIVE NEGATIVE Final    Comment:        The GeneXpert MRSA Assay (FDA approved for NASAL specimens only), is one component of a comprehensive MRSA colonization surveillance program. It is not intended to diagnose MRSA infection nor to guide or monitor treatment for MRSA infections.   Culture, expectorated sputum-assessment     Status: None   Collection Time: 09/19/15  1:40 PM  Result Value Ref Range Status   Specimen Description SPUTUM  Final   Special Requests NONE  Final   Sputum evaluation   Final     MICROSCOPIC FINDINGS SUGGEST THAT THIS SPECIMEN IS NOT REPRESENTATIVE OF LOWER RESPIRATORY SECRETIONS. PLEASE RECOLLECT. Gram Stain Report Called to,Read Back By and Verified With: R BARBER,RN AT [id number] 09/19/15 BY L BENFIELD    Report Status 09/19/2015 FINAL  Final  Culture, expectorated sputum-assessment     Status: None   Collection Time: 09/19/15 10:20 PM  Result Value Ref Range Status   Specimen Description SPUTUM  Final   Special Requests NONE  Final   Sputum evaluation   Final    THIS SPECIMEN IS ACCEPTABLE. RESPIRATORY CULTURE REPORT TO FOLLOW.   Report Status 09/20/2015 FINAL  Final   Studies/Results: Dg Chest Port 1 View  09/19/2015  CLINICAL DATA:  Flash pulmonary edema. EXAM: PORTABLE CHEST 1 VIEW COMPARISON:  09/19/2015 at 0304 hours FINDINGS: The cardiac silhouette remains enlarged. Patchy bilateral perihilar and basilar lung opacities and confluent opacity in the right upper lung are stable to minimally improved. No sizable pleural effusion or pneumothorax is identified. IMPRESSION: Stable to minimally improved lung opacities which may reflect edema or infection. Electronically Signed   By: Logan Bores M.D.   On: 09/19/2015 07:37   Dg Chest Portable 1 View  09/19/2015  CLINICAL DATA:  Shortness of breath tonight. EXAM: PORTABLE CHEST 1 VIEW COMPARISON:  12/07/2014 FINDINGS: Cardiomegaly and mediastinal contours are unchanged. Development of multifocal bilateral pulmonary opacities. Ill-defined patchy opacities in the perihilar regions, with the more confluent right upper lung zone opacity. No large pleural effusion. No pneumothorax. IMPRESSION: Multifocal bilateral pulmonary opacities. Pulmonary edema versus infection, or combination of both. Electronically Signed   By: Jeb Levering M.D.   On: 09/19/2015 03:27   Medications: I have reviewed the patient's current medications. Scheduled Meds: . antiseptic oral rinse  7 mL Mouth Rinse BID  . carvedilol  12.5 mg Oral BID  .  cloNIDine  0.3 mg Oral BID  . clopidogrel  75 mg Oral Daily  . enoxaparin (LOVENOX) injection  30 mg Subcutaneous Daily  . furosemide  80 mg Intravenous TID  . hydrALAZINE  100 mg Oral TID  . insulin aspart  0-20 Units Subcutaneous TID WC  . insulin glargine  25 Units Subcutaneous QHS  . isosorbide mononitrate  60 mg Oral Daily  . oseltamivir  30 mg Oral Daily  . potassium chloride  40 mEq Oral BID  . pravastatin  40 mg Oral q1800  . sodium chloride flush  3 mL Intravenous Q12H   Continuous Infusions: . nitroGLYCERIN Stopped (09/19/15 1135)   PRN Meds:.acetaminophen **OR** acetaminophen, promethazine, senna-docusate   Assessment/Plan:  Acute hypoxic respiratory failure from influenza:  This was likely precipitated by influenza and has resolved. He's feeling well and oxygenating well on room air. We'll hold his diuretics today as he appears euvolemic and his creatinine bumped overnight with heavy diuresis yesterday. We will have him walk and check his saturations today; if he continues to feel okay and oxygenates well, he may be able to go home today and follow in our clinic next week. -Check oxygenation while walking today -Stopped furosemide 80mg  IV three times daily -Holding diuretics today -Will resume home furosemide 40mg  twice daily upon discharge -He'll need to follow in clinic next week to re-check creatinine and volume status -Continue oseltamivir 30mg  daily, stop date 2/10  Hypertensive emergency: This was likely rebound hypertension from not taking his clonidine for 2 days. He is now weaned off the nitroglycerin drip, and is normotensive on a slightly tweaked home regimen. -Continue carvedilol 12.5 mg twice daily -Continue clonidine 0.3 mg twice daily -Continue hydralazine 100mg  three times daily -Continue Imdur 60mg  daily  Acute on chronic kidney disease stage 3: Creatinine 4.1 today, up from 3.7 yesterday, likely in the setting of over diuresis with IV Lasix. -Resume  home Lasix 40 mg twice daily once his creatinine decreases  Dispo: Disposition is deferred at this time, awaiting improvement of current medical problems.  Anticipated discharge in approximately 0-1 day(s).   The patient does have a current PCP Lucious Groves, DO) and does need an [facility] hospital follow-up appointment after discharge.  The patient does not know have transportation limitations that hinder transportation to clinic appointments.  .Services Needed at time of discharge: Y = Yes, Blank = No PT:   OT:   RN:   Equipment:   Other:     LOS: 1 day   [name], MD 09/20/2015, 8:09 AM

## 2015-09-20 NOTE — Progress Notes (Signed)
At approximately 0915 a doctor with a red dot in his forehead, stuck his head in the room of my patient in room 3s 02.  He stated he had just seen this patient and took him "off of oxygen, he doesn't need it, he is doing fine without it; and, he needed help getting to the recliner to eat breakfast".  Upon entry into room, patient was found on his left side, with the oxygen turned off, and the pulse ox reading on the room monitor of 79% with a correlating wave form.  Patient was promptly placed back on oxygen via nasal cannula at 2 l/min.  Sats returned to 97% with a correlating waveform.  Patient assisted to recliner for breakfast.

## 2015-09-20 NOTE — Progress Notes (Signed)
Dr. Melburn Hake contacted at [phone].  He was given the last two BPs and asked about the dosing of apresoline, as well as, the cardiac monitoring.  New orders received and enacted.

## 2015-09-20 NOTE — Progress Notes (Signed)
Pt ambulated for second time today, 196ft without difficulty.  Pulse ox sats this time maintained 95% during walk.  Upon return to bed for rest, sats down to upper 80's.  Oxygen restarted at 1 l/m via nasal cannula.  Sats 93-94%.

## 2015-09-20 NOTE — Progress Notes (Signed)
Patient ambulated without assistive devices 150 ft and returned to room to chair.  Upon return to room, patient's pulse ox sat on room air was 85% with a correlating wave form.  Pt placed on nasal cannula oxygen at 1 l/m.  Sat up to 96% with correlating wave form.  Pt denied pain, lightheadedness, or shortness of breath during and after walking.

## 2015-09-21 ENCOUNTER — Inpatient Hospital Stay (HOSPITAL_COMMUNITY): Payer: PPO

## 2015-09-21 DIAGNOSIS — J111 Influenza due to unidentified influenza virus with other respiratory manifestations: Secondary | ICD-10-CM

## 2015-09-21 DIAGNOSIS — N179 Acute kidney failure, unspecified: Secondary | ICD-10-CM | POA: Diagnosis not present

## 2015-09-21 DIAGNOSIS — J9601 Acute respiratory failure with hypoxia: Secondary | ICD-10-CM

## 2015-09-21 DIAGNOSIS — I161 Hypertensive emergency: Secondary | ICD-10-CM | POA: Diagnosis not present

## 2015-09-21 LAB — GLUCOSE, CAPILLARY
GLUCOSE-CAPILLARY: 76 mg/dL (ref 65–99)
Glucose-Capillary: 137 mg/dL — ABNORMAL HIGH (ref 65–99)
Glucose-Capillary: 106 mg/dL — ABNORMAL HIGH (ref 65–99)

## 2015-09-21 LAB — BASIC METABOLIC PANEL
ANION GAP: 13 (ref 5–15)
ANION GAP: 14 (ref 5–15)
BUN: 85 mg/dL — ABNORMAL HIGH (ref 6–20)
BUN: 83 mg/dL — ABNORMAL HIGH (ref 6–20)
CALCIUM: 7.8 mg/dL — AB (ref 8.9–10.3)
CALCIUM: 7.7 mg/dL — AB (ref 8.9–10.3)
CHLORIDE: 96 mmol/L — AB (ref 101–111)
CHLORIDE: 97 mmol/L — AB (ref 101–111)
CO2: 27 mmol/L (ref 22–32)
CO2: 25 mmol/L (ref 22–32)
CREATININE: 4.35 mg/dL — AB (ref 0.61–1.24)
CREATININE: 3.84 mg/dL — AB (ref 0.61–1.24)
GFR calc non Af Amer: 14 mL/min — ABNORMAL LOW (ref >60)
GFR calc non Af Amer: 16 mL/min — ABNORMAL LOW (ref >60)
GFR, EST AFRICAN AMERICAN: 16 mL/min — AB (ref >60)
GFR, EST AFRICAN AMERICAN: 18 mL/min — AB (ref >60)
Glucose, Bld: 102 mg/dL — ABNORMAL HIGH (ref 65–99)
Glucose, Bld: 125 mg/dL — ABNORMAL HIGH (ref 65–99)
Potassium: 3.5 mmol/L (ref 3.5–5.1)
Potassium: 3.2 mmol/L — ABNORMAL LOW (ref 3.5–5.1)
SODIUM: 136 mmol/L (ref 135–145)
SODIUM: 136 mmol/L (ref 135–145)

## 2015-09-21 MED ORDER — SENNOSIDES-DOCUSATE SODIUM 8.6-50 MG PO TABS: 1.0000 | ORAL_TABLET | Freq: Every evening | ORAL | Status: DC | PRN

## 2015-09-21 MED ORDER — POTASSIUM CHLORIDE CRYS ER 20 MEQ PO TBCR
40.0000 meq | EXTENDED_RELEASE_TABLET | Freq: Once | ORAL | Status: AC
  Administered 2015-09-21: 40 meq via ORAL
  Filled 2015-09-21: qty 2

## 2015-09-21 MED ORDER — FUROSEMIDE 40 MG PO TABS: ORAL_TABLET | ORAL | Status: DC

## 2015-09-21 MED ORDER — OSELTAMIVIR PHOSPHATE 30 MG PO CAPS: ORAL_CAPSULE | ORAL | Status: DC

## 2015-09-21 MED ORDER — CLONIDINE HCL 0.1 MG PO TABS: ORAL_TABLET | ORAL | Status: DC

## 2015-09-21 MED ORDER — SODIUM CHLORIDE 0.9 % IV SOLN
INTRAVENOUS | Status: AC
  Administered 2015-09-21: 150 mL/h via INTRAVENOUS

## 2015-09-21 NOTE — Progress Notes (Signed)
Patient ID: Charles Hart, male   DOB: [birth year], 60 y.o.   MRN: [name]   Subjective: [name] is feeling much better today and is eager to go home. He denies any shortness of breath of chest pain. His grandbaby is having open heart surgery in [place] this week and he promised to drive him.  Objective: Vital signs in last 24 hours: Filed Vitals:   09/21/15 0434 09/21/15 0500 09/21/15 0609 09/21/15 0743  BP:    150/81  Pulse:      Temp:    98.3 F (36.8 C)  TempSrc:    Oral  Resp: 9 7 12    Height:      Weight:      SpO2: 88% 84% 98%    General: very friendly man resting in bed comfortably, appropriately conversational Cardiac: regular rate and rhythm, no rubs, murmurs or gallops Pulm: breathing well, clear to auscultation bilaterally Abd: bowel sounds normal, soft, nondistended, non-tender Ext: warm and well perfused, without pedal edema  Lab Results: Basic Metabolic Panel:  Recent Labs Lab 09/19/15 0620  09/20/15 0436 09/21/15 0215  NA  --   < > 136 136  K  --   < > 3.1* 3.5  CL  --   < > 96* 96*  CO2  --   < > 27 27  GLUCOSE  --   < > 122* 102*  BUN  --   < > 80* 85*  CREATININE  --   < > 4.13* 4.35*  CALCIUM  --   < > 7.6* 7.8*  MG 1.5*  --  2.2  --   PHOS 4.6  --  5.0*  --   < > = values in this interval not displayed.   Studies/Results: Dg Chest 2 View  09/20/2015  CLINICAL DATA:  Pulmonary edema EXAM: CHEST  2 VIEW COMPARISON:  09/19/2015 FINDINGS: Normal cardiac silhouette. There is improvement in fine airspace disease seen on prior. No infiltrate. No pneumothorax. IMPRESSION: Improvement in pulmonary edema pattern. Electronically Signed   By: Suzy Bouchard M.D.   On: 09/20/2015 08:38   Medications: I have reviewed the patient's current medications. Scheduled Meds: . antiseptic oral rinse  7 mL Mouth Rinse BID  . carvedilol  12.5 mg Oral BID  . cloNIDine  0.2 mg Oral BID  . clopidogrel  75 mg Oral Daily  . enoxaparin (LOVENOX) injection  30  mg Subcutaneous Daily  . hydrALAZINE  50 mg Oral TID  . insulin aspart  0-20 Units Subcutaneous TID WC  . insulin glargine  25 Units Subcutaneous QHS  . isosorbide mononitrate  60 mg Oral Daily  . oseltamivir  30 mg Oral Daily  . pravastatin  40 mg Oral q1800  . sodium chloride flush  3 mL Intravenous Q12H   Continuous Infusions: . sodium chloride 150 mL/hr (09/21/15 0806)   PRN Meds:.acetaminophen **OR** acetaminophen, promethazine, senna-docusate   Assessment/Plan:  Acute hypoxic respiratory failure from influenza: This was likely precipitated by influenza and has resolved. He's feeling well and oxygenating well on room air. We will have him walk and check his saturations today; if he continues to feel okay and oxygenates well, can go home; if he requires oxygen, we can arrange that as well.  -Check oxygenation while walking today -Continue oseltamivir 30mg  daily, stop date 2/10  Acute on chronic kidney disease stage 3: Creatinine up to 4.5 today after getting heavily diuresed. -Will re-hydrate and check another BMP this afternoon at 1pm [facility]  resume home furosemide 40mg  daily as needed for swelling upon discharge  -Will get outpatient BMP next week in our clinic  Hypertensive emergency from missing clonidine doses: This was likely rebound hypertension from not taking his clonidine for 2 days. He is now weaned off the nitroglycerin drip, and is normotensive on a slightly tweaked home regimen. -Continue carvedilol 12.5 mg twice daily -Continue clonidine 0.3 mg twice daily -Continue hydralazine 100mg  three times daily -Continue Imdur 60mg  daily  Dispo: Discharge to home today after we check oxygenation while ambulating (if he needs oxygen) and he gets some fluids.  The patient does have a current PCP Charles Groves, DO) and our secretaries will schedule him an appointment early next week.  The patient does have transportation limitations that hinder transportation to clinic  appointments.  .Services Needed at time of discharge: Y = Yes, Blank = No PT:   OT:   RN:   Equipment:   Other:     LOS: 2 days   [name], MD 09/21/2015, 10:21 AM

## 2015-09-21 NOTE — Progress Notes (Signed)
Patient desats while sleeping, bouncing between the 80-89% range.  Patient is currently on 3L [place] with an O2 saturation of 82%.  [facility] is aware of this situation.  Will continue to monitor.

## 2015-09-21 NOTE — Progress Notes (Signed)
Pt taken out via wheelchair to be taken home by wife and children.

## 2015-09-21 NOTE — Care Management Note (Signed)
Case Management Note  Patient Details  Name: Charles Hart MRN: [name] Date of Birth: December 02, 1955  Subjective/Objective:    Date: 09/21/15 Spoke with patient at the bedside.  Introduced self as Tourist information centre manager and explained role in discharge planning and how to be reached.  Verified patient lives in town, with spouse, pta indep. Expressed potential need for no other DME.  Verified patient anticipates to go home with family, at time of discharge and will have full-time supervision by family at this time to best of their knowledge. Patient denied needing help with their medication.  Patient drives and  is driven by wife to MD appointments.  Verified patient has PCP [name].   Plan: CM will continue to follow for discharge planning and [facility] resources.                 Action/Plan:   Expected Discharge Date:  09/20/15               Expected Discharge Plan:  Home/Self Care  In-House Referral:     Discharge planning Services  CM Consult  Post Acute Care Choice:    Choice offered to:     DME Arranged:    DME Agency:     HH Arranged:    HH Agency:     Status of Service:  Completed, signed off  Medicare Important Message Given:    Date Medicare IM Given:    Medicare IM give by:    Date Additional Medicare IM Given:    Additional Medicare Important Message give by:     If discussed at [place] of Stay Meetings, dates discussed:    Additional Comments:  Zenon Mayo, RN 09/21/2015, 2:56 PM

## 2015-09-21 NOTE — Progress Notes (Signed)
Discharge instructions reviewed with patient.  IVs removed.  Heart Failure book obtained for patient for d/c.  All belongings returned to patient.

## 2015-09-21 NOTE — Progress Notes (Signed)
Living Better with Heart Failure booklet obtained and provided to patient for his review.

## 2015-09-21 NOTE — Progress Notes (Signed)
Offered twice to set patient up so he could bathe himself.  Stated he will "wait til my wife gets here, or I go home."

## 2015-09-21 NOTE — Progress Notes (Signed)
Patient walked 150 ft without oxygen, and the lowest pulse ox sat I saw was 93% and that was very brief.  Otherwise the sat was 96 - 97%.

## 2015-09-22 LAB — CULTURE, RESPIRATORY W GRAM STAIN

## 2015-09-22 LAB — CULTURE, RESPIRATORY: CULTURE: NORMAL

## 2015-09-24 LAB — CULTURE, BLOOD (ROUTINE X 2)
CULTURE: NO GROWTH
Culture: NO GROWTH

## 2015-09-28 ENCOUNTER — Encounter: Payer: Self-pay | Admitting: Internal Medicine

## 2015-09-28 ENCOUNTER — Ambulatory Visit (INDEPENDENT_AMBULATORY_CARE_PROVIDER_SITE_OTHER): Payer: PPO | Admitting: Internal Medicine

## 2015-09-28 VITALS — BP 133/65 | HR 71 | Temp 98.0°F | Resp 18 | Ht 72.0 in | Wt 230.5 lb

## 2015-09-28 DIAGNOSIS — E1142 Type 2 diabetes mellitus with diabetic polyneuropathy: Secondary | ICD-10-CM | POA: Diagnosis not present

## 2015-09-28 DIAGNOSIS — Z79899 Other long term (current) drug therapy: Secondary | ICD-10-CM | POA: Diagnosis not present

## 2015-09-28 DIAGNOSIS — E1122 Type 2 diabetes mellitus with diabetic chronic kidney disease: Secondary | ICD-10-CM

## 2015-09-28 DIAGNOSIS — Z794 Long term (current) use of insulin: Secondary | ICD-10-CM

## 2015-09-28 DIAGNOSIS — I10 Essential (primary) hypertension: Secondary | ICD-10-CM

## 2015-09-28 DIAGNOSIS — N183 Chronic kidney disease, stage 3 (moderate): Secondary | ICD-10-CM | POA: Diagnosis not present

## 2015-09-28 NOTE — Progress Notes (Signed)
Last eye exam 07/21/15, has follow-up on 11/25/15.  Notes requested from Dr. Katy Fitch.

## 2015-09-28 NOTE — Progress Notes (Signed)
[facility] Subjective:   Patient ID: Charles Hart male   DOB: 10/26/1955 60 y.o.   MRN: [name]  HPI: Charles Hart is a 60 y.o. male with a PMH detailed below who presents for hospital follow up.  He was recently hospitalized for acute respiratory failure due to influenza and hypertensive emergency due to missed doses of clonidine.  He was discharged on 09/21/15 and reports he is doing very well.  Please see problem based charting below for the status of his chronic medical problems.    Past Medical History  Diagnosis Date  . Dermatitis   . CHF (congestive heart failure) (HCC)     LV function improved from 2004 to 2008.  Historically, moderately dilated LV with EF 30-40% by 2D echo 08/14/2002.  Mild CAD with severe LV dysfunction by cardiac cath 09/2002.  Normal coronary arteries and normal LV function by cardiac cath 09/19/2006.  A 2-D echo on 04/01/2009 showed mild concentric hypertrophy and normal systolic (LVEF  123456) and doppler C/W with grade 1 diastolic dysfunction..   . Hypertension   . Hyperlipidemia   . Hearing loss in right ear   . Cardiomyopathy     LV function improved from 2004 to 2008.  Historically, moderately dilated LV with EF 30-40% by 2D echo 08/14/2002.  Mild CAD with severe LV dysfunction by cardiac cath 09/2002.  Normal coronary arteries and normal LV function by cardiac cath 09/19/2006.  A 2-D echo on 04/01/2009 showed mild concentric hypertrophy and normal systolic (LVEF  123456) and doppler C/W with grade 1 diastolic dysfunction.  . DM neuropathy, painful ([place])   . CVA (cerebral vascular accident) ([place]) 07/04/2012    MRI of the brain 07/04/2012 showed an acute infarct in the right basal ganglia involving the anterior putamen, anterior limb internal capsule, and head of the caudate; this measured approximately 2.5 cm in diameter.     . Hypertensive crisis 07/28/2012  . Myocardial infarction ([place])   . Diabetes mellitus     type 2   . Hypertensive urgency 08/20/2014  . Adenomatous colon polyp 07/02/2011    Last colonoscopy May 06, 2011 by Dr. Owens Loffler, who recommended repeat colonoscopy in 5 years.   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 Background diabetic retinopathy 04/20/2012    Patient is followed by Dr. Katy Fitch   . Chronic combined systolic and diastolic congestive heart failure ([place]) 05/21/2010    LV function improved from 2004 to 2008.  Historically, moderately dilated LV with EF 30-40% by 2D echo 08/14/2002.  Mild CAD with severe LV dysfunction by cardiac cath 09/2002.  Normal coronary arteries and normal LV function by cardiac cath 09/19/2006.  A 2-D echo on 04/01/2009 showed mild concentric hypertrophy and normal systolic (LVEF  123456) and doppler parameters consistent with abnormal left   . Nephrotic syndrome 02/18/2013    A 24-hour urine collection 03/04/2013 showed total protein of 5,460 g and creatinine clearance of 80 mL/minute.  Patient was seen by Seward Meth at [facility] and a repeat 24-hour urine showed 10,407 mg protein.  Patient underwent kidney biopsy on 05/30/2013; pathology showed advanced diffuse and nodular diabetic nephropathy with vascular changes consistent with long-standing difficult to control hypertension.      Current Outpatient Prescriptions  Medication Sig Dispense Refill  . acetaminophen (TYLENOL) 500 MG tablet Take 1,000 mg by mouth every 6 (six) hours as  needed (pain).     . BD PEN NEEDLE NANO U/F 32G X 4 MM MISC USE TO INJECT LANTUS ONCE A DAY AND NOVOLOG THREE TIMES DAILY 200 each 2  . carvedilol (COREG) 12.5 MG tablet Take 1 tablet (12.5 mg total) by mouth 2 (two) times daily. 60 tablet 6  . cloNIDine (CATAPRES) 0.1 MG tablet Take 2 tabs daily for 3 days, then 1 tab daily until we see you in clinic 30 tablet 0  . clopidogrel (PLAVIX) 75 MG tablet Take 1 tablet (75 mg total) by mouth daily.  90 tablet 3  . diltiazem (CARTIA XT) 180 MG 24 hr capsule Take 180 mg by mouth daily.    . fluticasone (FLONASE) 50 MCG/ACT nasal spray Place 2 sprays into both nostrils daily as needed for allergies or rhinitis.    . furosemide (LASIX) 40 MG tablet Take 1 tablet ONLY if your legs start to swell OR your weight increases by 2 pounds 180 tablet 3  . gabapentin (NEURONTIN) 300 MG capsule TAKE 1 CAPSULE(300 MG) BY MOUTH EVERY MORNING 90 capsule 2  . hydrALAZINE (APRESOLINE) 50 MG tablet TAKE 2 TABLETS BY MOUTH THREE TIMES DAILY 180 tablet 0  . insulin aspart (NOVOLOG FLEXPEN) 100 UNIT/ML FlexPen Inject 7 Units into the skin 3 (three) times daily with meals.    . Insulin Glargine (LANTUS SOLOSTAR) 100 UNIT/ML Solostar Pen Inject 30 Units into the skin daily at 10 pm. (Patient taking differently: Inject 30 Units into the skin at bedtime. ) 15 mL 5  . isosorbide mononitrate (IMDUR) 60 MG 24 hr tablet TAKE 1 TABLET(60 MG) BY MOUTH DAILY (Patient taking differently: Take 60 mg by mouth daily. ) 30 tablet 1  . ONE TOUCH ULTRA TEST test strip USE TO TEST BLOOD SUGAR THREE TIMES DAILY 100 each 11  . ONETOUCH DELICA LANCETS 99991111 MISC USE TO TEST BLOOD SUGAR THREE TIMES DAILY 100 each 11  . oseltamivir (TAMIFLU) 30 MG capsule Take 1 capsule daily until 2/8 2 capsule 0  . pravastatin (PRAVACHOL) 40 MG tablet Take 1 tablet (40 mg total) by mouth daily. 90 tablet 3  . senna-docusate (SENOKOT-S) 8.6-50 MG tablet Take 1 tablet by mouth at bedtime as needed for mild constipation. 30 tablet 0   No current facility-administered medications for this visit.   Family History  Problem Relation Age of Onset  . Colon cancer Sister   . Aneurysm Father 56    died of rupture   Social History   Social History  . Marital Status: Married    Spouse Name: N/A  . Number of Children: N/A  . Years of Education: N/A   Social History Main Topics  . Smoking status: Never Smoker   . Smokeless tobacco: Never Used  . Alcohol  Use: No     Comment: Wine occasionally (no more than 2 glasses per month)  . Drug Use: No  . Sexual Activity: Not Currently   Other Topics Concern  . None   Social History Narrative   Review of Systems: Review of Systems  Constitutional: Negative for fever, chills and malaise/fatigue.  HENT: Negative for congestion.   Respiratory: Negative for cough and shortness of breath.   Cardiovascular: Negative for chest pain.  Gastrointestinal: Negative for abdominal pain.  Genitourinary: Negative for dysuria.  Musculoskeletal: Negative for myalgias.  Neurological: Negative for dizziness and headaches.     Objective:  Physical Exam: Filed Vitals:   09/28/15 1035  BP: 133/65  Pulse: 71  Temp:  98 F (36.7 C)  TempSrc: Oral  Resp: 18  Height: 6' (1.829 m)  Weight: 230 lb 8 oz (104.554 kg)  SpO2: 98%   Physical Exam  Constitutional: He is well-developed, well-nourished, and in no distress.  Cardiovascular: Normal rate and regular rhythm.   Pulmonary/Chest: Effort normal and breath sounds normal.  Abdominal: Soft. Bowel sounds are normal.  Musculoskeletal: He exhibits no edema.  Skin: Skin is warm and dry.  Nursing note and vitals reviewed.   Assessment & Plan:  Case discussed with Dr. Daryll Drown  Essential hypertension HPI:  Since discharge he has been taking Coreg 12.5mg  BID, IMDUR 60mg  daily, Hydralazone 100mg  TID, and Diltiazem 180mg  dialy, as well as Clonidine 0.1mg  BID.  He has not had any trouble with this regimen and reports that he is feeling great.  A: Essential HTN  P: I discussed that given his previous missed doses and resultant ICU admission I really do want to get him off Clonidine.  Will have him decrease to 0.1mg  daily for 2 weeks then off.  He is scheduled to follow up with me in 3 weeks.  Diabetic peripheral neuropathy associated with type 2 diabetes mellitus (HCC) HPI: He has been taking Lantus 30 units QHS and Novolog 7units TID.  HE reports that his blood  sugars have been well controlled and he has not had any other issues.  He denies any hypoglycemia.  He denies any polyuria or polydispia.  A: Controlled Type 2 DM with Peripheral neuropathy  P: Continue current regimen of insulin, continue Gabapentin 300mg  daily    Medications Ordered No orders of the defined types were placed in this encounter.   Other Orders Orders Placed This Encounter  Procedures  . BMP8+Anion Gap   Follow Up: Return in about 1 month (around 10/26/2015).

## 2015-09-28 NOTE — Patient Instructions (Addendum)
General Instructions:  I want you to start taking Clonidine once a day for 2 weeks, then stop clonidine and I will see you in 1 month.   Please bring your medicines with you each time you come to clinic.  Medicines may include prescription medications, over-the-counter medications, herbal remedies, eye drops, vitamins, or other pills.   Progress Toward Treatment Goals:  Treatment Goal 07/02/2015  Hemoglobin A1C at goal  Blood pressure improved    Self Care Goals & Plans:  Self Care Goal 07/02/2015  Manage my medications take my medicines as prescribed; bring my medications to every visit; refill my medications on time  Monitor my health keep track of my blood glucose; bring my glucose meter and log to each visit; check my feet daily  Eat healthy foods drink diet soda or water instead of juice or soda; eat more vegetables; eat foods that are low in salt; eat smaller portions  Be physically active take a walk every day    Home Blood Glucose Monitoring 07/02/2015  Check my blood sugar 2 times a day  When to check my blood sugar before breakfast; before dinner     Care Management & Community Referrals:  Referral 07/02/2015  Referrals made for care management support none needed

## 2015-09-29 ENCOUNTER — Encounter: Payer: Self-pay | Admitting: *Deleted

## 2015-09-29 LAB — BMP8+ANION GAP
Anion Gap: 21 mmol/L — ABNORMAL HIGH (ref 10.0–18.0)
BUN / CREAT RATIO: 20 (ref 9–20)
BUN: 50 mg/dL — ABNORMAL HIGH (ref 6–24)
CO2: 20 mmol/L (ref 18–29)
CREATININE: 2.55 mg/dL — AB (ref 0.76–1.27)
Calcium: 8.2 mg/dL — ABNORMAL LOW (ref 8.7–10.2)
Chloride: 98 mmol/L (ref 96–106)
GFR calc Af Amer: 31 mL/min/{1.73_m2} — ABNORMAL LOW (ref >59)
GFR, EST NON AFRICAN AMERICAN: 27 mL/min/{1.73_m2} — AB (ref >59)
Glucose: 210 mg/dL — ABNORMAL HIGH (ref 65–99)
Potassium: 3.9 mmol/L (ref 3.5–5.2)
SODIUM: 139 mmol/L (ref 134–144)

## 2015-10-04 NOTE — Assessment & Plan Note (Signed)
HPI:  Since discharge he has been taking Coreg 12.5mg  BID, IMDUR 60mg  daily, Hydralazone 100mg  TID, and Diltiazem 180mg  dialy, as well as Clonidine 0.1mg  BID.  He has not had any trouble with this regimen and reports that he is feeling great.  A: Essential HTN  P: I discussed that given his previous missed doses and resultant ICU admission I really do want to get him off Clonidine.  Will have him decrease to 0.1mg  daily for 2 weeks then off.  He is scheduled to follow up with me in 3 weeks.

## 2015-10-04 NOTE — Assessment & Plan Note (Signed)
HPI: He has been taking Lantus 30 units QHS and Novolog 7units TID.  HE reports that his blood sugars have been well controlled and he has not had any other issues.  He denies any hypoglycemia.  He denies any polyuria or polydispia.  A: Controlled Type 2 DM with Peripheral neuropathy  P: Continue current regimen of insulin, continue Gabapentin 300mg  daily

## 2015-10-05 NOTE — Progress Notes (Signed)
Internal Medicine Clinic Attending  Case discussed with Dr. Hoffman at the time of the visit.  We reviewed the resident's history and exam and pertinent patient test results.  I agree with the assessment, diagnosis, and plan of care documented in the resident's note.  

## 2015-10-05 NOTE — Addendum Note (Signed)
Addended by: Gilles Chiquito B on: 10/05/2015 08:57 AM   Modules accepted: Level of Service

## 2015-10-07 ENCOUNTER — Encounter: Payer: Self-pay | Admitting: Internal Medicine

## 2015-10-07 DIAGNOSIS — H2513 Age-related nuclear cataract, bilateral: Secondary | ICD-10-CM | POA: Insufficient documentation

## 2015-10-08 ENCOUNTER — Telehealth: Payer: Self-pay | Admitting: Pharmacist

## 2015-10-12 ENCOUNTER — Encounter: Payer: Self-pay | Admitting: Pharmacist

## 2015-10-12 NOTE — Telephone Encounter (Signed)
Unable to reach patient for post-discharge medication review. [facility] pharmacy history shows fair adherence, but patient misses some fills and is currently out of refills for carvedilol, clopidogrel, hydralazine, insulin aspart, isosorbide mononitrate, and pravastatin. Will notify PCP.

## 2015-10-14 ENCOUNTER — Other Ambulatory Visit: Payer: Self-pay | Admitting: Internal Medicine

## 2015-10-15 ENCOUNTER — Ambulatory Visit (INDEPENDENT_AMBULATORY_CARE_PROVIDER_SITE_OTHER): Payer: PPO | Admitting: Internal Medicine

## 2015-10-15 VITALS — BP 161/74 | HR 84 | Temp 97.5°F | Wt 232.7 lb

## 2015-10-15 DIAGNOSIS — N289 Disorder of kidney and ureter, unspecified: Secondary | ICD-10-CM

## 2015-10-15 DIAGNOSIS — IMO0002 Reserved for concepts with insufficient information to code with codable children: Secondary | ICD-10-CM

## 2015-10-15 DIAGNOSIS — N189 Chronic kidney disease, unspecified: Secondary | ICD-10-CM | POA: Diagnosis not present

## 2015-10-15 DIAGNOSIS — I130 Hypertensive heart and chronic kidney disease with heart failure and stage 1 through stage 4 chronic kidney disease, or unspecified chronic kidney disease: Secondary | ICD-10-CM

## 2015-10-15 DIAGNOSIS — I5043 Acute on chronic combined systolic (congestive) and diastolic (congestive) heart failure: Secondary | ICD-10-CM | POA: Diagnosis not present

## 2015-10-15 DIAGNOSIS — E1122 Type 2 diabetes mellitus with diabetic chronic kidney disease: Secondary | ICD-10-CM | POA: Diagnosis not present

## 2015-10-15 DIAGNOSIS — I10 Essential (primary) hypertension: Secondary | ICD-10-CM

## 2015-10-15 DIAGNOSIS — N183 Chronic kidney disease, stage 3 (moderate): Principal | ICD-10-CM

## 2015-10-15 DIAGNOSIS — Z9114 Patient's other noncompliance with medication regimen: Secondary | ICD-10-CM

## 2015-10-15 DIAGNOSIS — E1165 Type 2 diabetes mellitus with hyperglycemia: Principal | ICD-10-CM

## 2015-10-15 DIAGNOSIS — Z794 Long term (current) use of insulin: Secondary | ICD-10-CM | POA: Diagnosis not present

## 2015-10-15 DIAGNOSIS — Z9989 Dependence on other enabling machines and devices: Secondary | ICD-10-CM

## 2015-10-15 DIAGNOSIS — G4733 Obstructive sleep apnea (adult) (pediatric): Secondary | ICD-10-CM

## 2015-10-15 DIAGNOSIS — Z79899 Other long term (current) drug therapy: Secondary | ICD-10-CM

## 2015-10-15 DIAGNOSIS — E1129 Type 2 diabetes mellitus with other diabetic kidney complication: Secondary | ICD-10-CM

## 2015-10-15 DIAGNOSIS — I110 Hypertensive heart disease with heart failure: Secondary | ICD-10-CM

## 2015-10-15 LAB — POCT GLYCOSYLATED HEMOGLOBIN (HGB A1C): Hemoglobin A1C: 6.1

## 2015-10-15 LAB — GLUCOSE, CAPILLARY: GLUCOSE-CAPILLARY: 109 mg/dL — AB (ref 65–99)

## 2015-10-15 MED ORDER — ISOSORBIDE MONONITRATE ER 60 MG PO TB24: ORAL_TABLET | ORAL | Status: DC

## 2015-10-15 MED ORDER — CARVEDILOL 12.5 MG PO TABS: 12.5000 mg | ORAL_TABLET | Freq: Two times a day (BID) | ORAL | Status: DC

## 2015-10-15 MED ORDER — CLOPIDOGREL BISULFATE 75 MG PO TABS: 75.0000 mg | ORAL_TABLET | Freq: Every day | ORAL | Status: DC

## 2015-10-15 MED ORDER — FUROSEMIDE 80 MG PO TABS: 80.0000 mg | ORAL_TABLET | Freq: Two times a day (BID) | ORAL | Status: DC

## 2015-10-15 MED ORDER — INSULIN ASPART 100 UNIT/ML FLEXPEN: 7.0000 [IU] | PEN_INJECTOR | Freq: Three times a day (TID) | SUBCUTANEOUS | Status: DC

## 2015-10-15 MED ORDER — HYDRALAZINE HCL 100 MG PO TABS: 100.0000 mg | ORAL_TABLET | Freq: Three times a day (TID) | ORAL | Status: DC

## 2015-10-15 MED ORDER — PRAVASTATIN SODIUM 40 MG PO TABS: 40.0000 mg | ORAL_TABLET | Freq: Every day | ORAL | Status: DC

## 2015-10-15 NOTE — Progress Notes (Signed)
Patient was not able to verbalize his medication regimen and did not bring med bottles with him today. Advised patient to bring meds to upcoming visit.

## 2015-10-15 NOTE — Patient Instructions (Addendum)
I want you to increase taking your lasix to twice a day (80mg ) for the next week.  Then come back to see me.  STOP taking Hydrochlorothiazide.  General Instructions:   Please bring your medicines with you each time you come to clinic.  Medicines may include prescription medications, over-the-counter medications, herbal remedies, eye drops, vitamins, or other pills.   Progress Toward Treatment Goals:  Treatment Goal 07/02/2015  Hemoglobin A1C at goal  Blood pressure improved    Self Care Goals & Plans:  Self Care Goal 07/02/2015  Manage my medications take my medicines as prescribed; bring my medications to every visit; refill my medications on time  Monitor my health keep track of my blood glucose; bring my glucose meter and log to each visit; check my feet daily  Eat healthy foods drink diet soda or water instead of juice or soda; eat more vegetables; eat foods that are low in salt; eat smaller portions  Be physically active take a walk every day    Home Blood Glucose Monitoring 07/02/2015  Check my blood sugar 2 times a day  When to check my blood sugar before breakfast; before dinner     Care Management & Community Referrals:  Referral 07/02/2015  Referrals made for care management support none needed

## 2015-10-15 NOTE — Assessment & Plan Note (Signed)
HPI:  Patient was supposed to be weaning down and off clonidine, his blood pressure at his last visit about 2-3 weeks ago was actually good.  However today his initial blood pressure was 203/106 which did improve some on recheck.  It is very difficult to gauge what exactly Charles Hart is taking as he seems more confused about his medication regimen.  He reports that he is taking Clonidine 0.3mg  once daily, HCTZ 25mg  daily, Lasix (40mg  at one point he said daily and another he said twice daily), IMDUR 60mg  daily and coreg 12.5mg  BID.  He reports that he recently ran out of hydralazine and needs a refill.  He reports that he does have diltiazem at home but has not been taking it.  A: Uncontrolled essential HTN  P: I asked our clinical pharmacist to become involved again with his medication adherence. I have asked him to STOP HCTZ. Increase Lasix to 80mg  TWICE daily for the next week Continue Hydralazine 100mg  TID Restart Diltiazem 180mg  daily Continue Coreg 12.5mg  BID Continue IMDUR 60mg  daily Continue current dose of clonidine (?0.3mg  daily) will work to get this off over the next few visits.  I suspect we will need weekly visits to get him under control.  Will check BMP today and again at follow up in 1 week.  I have asked that he call if he has questions in between.

## 2015-10-15 NOTE — Assessment & Plan Note (Signed)
HPI: He reports he was told by [facility] nurse that he needed a repeat sleep study.  He is not regularly using CPAP and when he is he has turned it down to 66mmg Hg  A: Severe OSA  P:  I want to focus on his heart failure and medication compliance right now, I don't feel a repeat sleep study will be as beneficial right now as simply trying to get him to use the CPAP at a higher level.  I have asked him to try to increase the pressure of his CPAP and will follow up on this next week.

## 2015-10-15 NOTE — Assessment & Plan Note (Signed)
HPI: He complains of increase DOE, he reports orthopnea is stable at 2 pillows.  I am unsure what dose of lasix he is truly taking. Wt Readings from Last 5 Encounters:  10/15/15 232 lb 11.2 oz (105.552 kg)  09/28/15 230 lb 8 oz (104.554 kg)  09/21/15 223 lb 12.3 oz (101.5 kg)  07/16/15 231 lb 12.8 oz (105.144 kg)  07/02/15 229 lb 9.6 oz (104.146 kg)    A: AoC combined CHF  P: His weight is trending up, I supect 40mg  of lasix is not enough for him with his level of renal dysfunction. I will increase him to 80mg  of Lasix BID, and have asked him to weight himself daily Check BMP today and again for follow up in 1 week. May need to get [employer] more involved.

## 2015-10-15 NOTE — Assessment & Plan Note (Signed)
HPI: He reports he is taking his insulin and denies hypoglycemia.  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

## 2015-10-15 NOTE — Progress Notes (Signed)
[facility] Subjective:   Patient ID: Charles Hart male   DOB: 10/03/1955 60 y.o.   MRN: [name]  HPI: [name] is a 60 y.o. male with a PMH detailed below who presents for 2 week follow up of HTN.  Please see problem based charting below for the status of his chronic medical problems.    Past Medical History  Diagnosis Date  . Dermatitis   . CHF (congestive heart failure) (HCC)     LV function improved from 2004 to 2008.  Historically, moderately dilated LV with EF 30-40% by 2D echo 08/14/2002.  Mild CAD with severe LV dysfunction by cardiac cath 09/2002.  Normal coronary arteries and normal LV function by cardiac cath 09/19/2006.  A 2-D echo on 04/01/2009 showed mild concentric hypertrophy and normal systolic (LVEF  123456) and doppler C/W with grade 1 diastolic dysfunction..   . Hypertension   . Hyperlipidemia   . Hearing loss in right ear   . Cardiomyopathy     LV function improved from 2004 to 2008.  Historically, moderately dilated LV with EF 30-40% by 2D echo 08/14/2002.  Mild CAD with severe LV dysfunction by cardiac cath 09/2002.  Normal coronary arteries and normal LV function by cardiac cath 09/19/2006.  A 2-D echo on 04/01/2009 showed mild concentric hypertrophy and normal systolic (LVEF  123456) and doppler C/W with grade 1 diastolic dysfunction.  . DM neuropathy, painful ([place])   . CVA (cerebral vascular accident) ([place]) 07/04/2012    MRI of the brain 07/04/2012 showed an acute infarct in the right basal ganglia involving the anterior putamen, anterior limb internal capsule, and head of the caudate; this measured approximately 2.5 cm in diameter.     . Hypertensive crisis 07/28/2012  . Myocardial infarction ([place])   . Diabetes mellitus     type 2  . Hypertensive urgency 08/20/2014  . Adenomatous colon polyp 07/02/2011    Last colonoscopy May 06, 2011 by Dr. Owens Loffler, who recommended repeat colonoscopy in 5 years.   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 Background diabetic retinopathy 04/20/2012    Patient is followed by Dr. Katy Fitch   . Chronic combined systolic and diastolic congestive heart failure ([place]) 05/21/2010    LV function improved from 2004 to 2008.  Historically, moderately dilated LV with EF 30-40% by 2D echo 08/14/2002.  Mild CAD with severe LV dysfunction by cardiac cath 09/2002.  Normal coronary arteries and normal LV function by cardiac cath 09/19/2006.  A 2-D echo on 04/01/2009 showed mild concentric hypertrophy and normal systolic (LVEF  123456) and doppler parameters consistent with abnormal left   . Nephrotic syndrome 02/18/2013    A 24-hour urine collection 03/04/2013 showed total protein of 5,460 g and creatinine clearance of 80 mL/minute.  Patient was seen by Seward Meth at [place] and a repeat 24-hour urine showed 10,407 mg protein.  Patient underwent kidney biopsy on 05/30/2013; pathology showed advanced diffuse and nodular diabetic nephropathy with vascular changes consistent with long-standing difficult to control hypertension.      Current Outpatient Prescriptions  Medication Sig Dispense Refill  . acetaminophen (TYLENOL) 500 MG tablet Take 1,000 mg by mouth every 6 (six) hours as needed (pain).     . BD PEN NEEDLE NANO U/F 32G X 4 MM MISC USE TO INJECT LANTUS ONCE A DAY AND NOVOLOG THREE TIMES DAILY 200 each  2  . carvedilol (COREG) 12.5 MG tablet Take 1 tablet (12.5 mg total) by mouth 2 (two) times daily. 60 tablet 6  . clopidogrel (PLAVIX) 75 MG tablet Take 1 tablet (75 mg total) by mouth daily. 90 tablet 3  . furosemide (LASIX) 80 MG tablet Take 1 tablet (80 mg total) by mouth 2 (two) times daily. 60 tablet 2  . isosorbide mononitrate (IMDUR) 60 MG 24 hr tablet TAKE 1 TABLET(60 MG) BY MOUTH DAILY 30 tablet 1  . diltiazem (CARTIA XT) 180 MG 24 hr capsule Take 180 mg by mouth daily. Reported on 10/15/2015    .  fluticasone (FLONASE) 50 MCG/ACT nasal spray Place 2 sprays into both nostrils daily as needed for allergies or rhinitis.    [name] gabapentin (NEURONTIN) 300 MG capsule TAKE 1 CAPSULE(300 MG) BY MOUTH EVERY MORNING 90 capsule 2  . hydrALAZINE (APRESOLINE) 100 MG tablet Take 1 tablet (100 mg total) by mouth 3 (three) times daily. 90 tablet 11  . insulin aspart (NOVOLOG FLEXPEN) 100 UNIT/ML FlexPen Inject 7 Units into the skin 3 (three) times daily with meals. 15 mL 5  . Insulin Glargine (LANTUS SOLOSTAR) 100 UNIT/ML Solostar Pen Inject 30 Units into the skin daily at 10 pm. (Patient taking differently: Inject 30 Units into the skin at bedtime. ) 15 mL 5  . ONE TOUCH ULTRA TEST test strip USE TO TEST BLOOD SUGAR THREE TIMES DAILY 100 each 11  . ONETOUCH DELICA LANCETS 99991111 MISC USE TO TEST BLOOD SUGAR THREE TIMES DAILY 100 each 11  . pravastatin (PRAVACHOL) 40 MG tablet Take 1 tablet (40 mg total) by mouth daily. 90 tablet 3  . senna-docusate (SENOKOT-S) 8.6-50 MG tablet Take 1 tablet by mouth at bedtime as needed for mild constipation. 30 tablet 0   No current facility-administered medications for this visit.   Family History  Problem Relation Age of Onset  . Colon cancer Sister   . Aneurysm Father 68    died of rupture   Social History   Social History  . Marital Status: Married    Spouse Name: N/A  . Number of Children: N/A  . Years of Education: N/A   Social History Main Topics  . Smoking status: Never Smoker   . Smokeless tobacco: Never Used  . Alcohol Use: No     Comment: Wine occasionally (no more than 2 glasses per month)  . Drug Use: No  . Sexual Activity: Not Currently   Other Topics Concern  . Not on file   Social History Narrative   Review of Systems: Review of Systems  Constitutional: Negative for fever, chills and weight loss.  Eyes: Negative for blurred vision.  Respiratory: Positive for shortness of breath. Negative for cough.   Cardiovascular: Positive for  orthopnea (2 pillow, unchanged).  Musculoskeletal: Negative for falls.  Neurological: Negative for dizziness and headaches.     Objective:  Physical Exam: Filed Vitals:   10/15/15 1454 10/15/15 1522  BP: 203/106 161/74  Pulse: 94 84  Temp: 97.5 F (36.4 C)   TempSrc: Oral   Weight: 232 lb 11.2 oz (105.552 kg)   SpO2: 99%   Physical Exam  Constitutional: He is well-developed, well-nourished, and in no distress.  Pulmonary/Chest: Effort normal and breath sounds normal. He has no rales.  Abdominal: Soft. Bowel sounds are normal.  Musculoskeletal: He exhibits edema (1+ bilateral lower leg).  Nursing note and vitals reviewed.   Assessment & Plan:  Case discussed with Dr. Dareen Piano  Essential  hypertension HPI:  Patient was supposed to be weaning down and off clonidine, his blood pressure at his last visit about 2-3 weeks ago was actually good.  However today his initial blood pressure was 203/106 which did improve some on recheck.  It is very difficult to gauge what exactly Mr Daughdrill is taking as he seems more confused about his medication regimen.  He reports that he is taking Clonidine 0.3mg  once daily, HCTZ 25mg  daily, Lasix (40mg  at one point he said daily and another he said twice daily), IMDUR 60mg  daily and coreg 12.5mg  BID.  He reports that he recently ran out of hydralazine and needs a refill.  He reports that he does have diltiazem at home but has not been taking it.  A: Uncontrolled essential HTN  P: I asked our clinical pharmacist to become involved again with his medication adherence. I have asked him to STOP HCTZ. Increase Lasix to 80mg  TWICE daily for the next week Continue Hydralazine 100mg  TID Restart Diltiazem 180mg  daily Continue Coreg 12.5mg  BID Continue IMDUR 60mg  daily Continue current dose of clonidine (?0.3mg  daily) will work to get this off over the next few visits.  I suspect we will need weekly visits to get him under control.  Will check BMP today and  again at follow up in 1 week.  I have asked that he call if he has questions in between.  Acute on chronic combined systolic (congestive) and diastolic (congestive) heart failure (HCC) HPI: He complains of increase DOE, he reports orthopnea is stable at 2 pillows.  I am unsure what dose of lasix he is truly taking. Wt Readings from Last 5 Encounters:  10/15/15 232 lb 11.2 oz (105.552 kg)  09/28/15 230 lb 8 oz (104.554 kg)  09/21/15 223 lb 12.3 oz (101.5 kg)  07/16/15 231 lb 12.8 oz (105.144 kg)  07/02/15 229 lb 9.6 oz (104.146 kg)    A: AoC combined CHF  P: His weight is trending up, I supect 40mg  of lasix is not enough for him with his level of renal dysfunction. I will increase him to 80mg  of Lasix BID, and have asked him to weight himself daily Check BMP today and again for follow up in 1 week. May need to get [facility] more involved.    Obstructive sleep apnea HPI: He reports he was told by [facility] nurse that he needed a repeat sleep study.  He is not regularly using CPAP and when he is he has turned it down to 37mmg Hg  A: Severe OSA  P:  I want to focus on his heart failure and medication compliance right now, I don't feel a repeat sleep study will be as beneficial right now as simply trying to get him to use the CPAP at a higher level.  I have asked him to try to increase the pressure of his CPAP and will follow up on this next week.  DM (diabetes mellitus), type 2 with renal complications (HCC) HPI: He reports he is taking his insulin and denies hypoglycemia.  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    Medications Ordered Meds ordered this encounter  Medications  . isosorbide mononitrate (IMDUR) 60 MG 24 hr tablet    Sig: TAKE 1 TABLET(60 MG) BY MOUTH DAILY    Dispense:  30 tablet    Refill:  1  . insulin aspart (NOVOLOG FLEXPEN) 100 UNIT/ML FlexPen  Sig: Inject 7 Units into the skin 3 (three)  times daily with meals.    Dispense:  15 mL    Refill:  5  . clopidogrel (PLAVIX) 75 MG tablet    Sig: Take 1 tablet (75 mg total) by mouth daily.    Dispense:  90 tablet    Refill:  3  . carvedilol (COREG) 12.5 MG tablet    Sig: Take 1 tablet (12.5 mg total) by mouth 2 (two) times daily.    Dispense:  60 tablet    Refill:  6    Please cancel all previous orders for current medication. Change in dosage or pill size.  . pravastatin (PRAVACHOL) 40 MG tablet    Sig: Take 1 tablet (40 mg total) by mouth daily.    Dispense:  90 tablet    Refill:  3  . hydrALAZINE (APRESOLINE) 100 MG tablet    Sig: Take 1 tablet (100 mg total) by mouth 3 (three) times daily.    Dispense:  90 tablet    Refill:  11  . furosemide (LASIX) 80 MG tablet    Sig: Take 1 tablet (80 mg total) by mouth 2 (two) times daily.    Dispense:  60 tablet    Refill:  2   Other Orders Orders Placed This Encounter  Procedures  . Glucose, capillary  . BMP8+Anion Gap  . POCT HgB A1C (CPT [phone])   Follow Up: Return in about 1 week (around 10/22/2015).

## 2015-10-16 LAB — BMP8+ANION GAP
Anion Gap: 22 mmol/L — ABNORMAL HIGH (ref 10.0–18.0)
BUN / CREAT RATIO: 12 (ref 9–20)
BUN: 36 mg/dL — AB (ref 6–24)
CO2: 18 mmol/L (ref 18–29)
CREATININE: 2.94 mg/dL — AB (ref 0.76–1.27)
Calcium: 8.3 mg/dL — ABNORMAL LOW (ref 8.7–10.2)
Chloride: 104 mmol/L (ref 96–106)
GFR calc Af Amer: 26 mL/min/{1.73_m2} — ABNORMAL LOW (ref >59)
GFR, EST NON AFRICAN AMERICAN: 22 mL/min/{1.73_m2} — AB (ref >59)
Glucose: 112 mg/dL — ABNORMAL HIGH (ref 65–99)
Potassium: 3.7 mmol/L (ref 3.5–5.2)
Sodium: 144 mmol/L (ref 134–144)

## 2015-10-19 ENCOUNTER — Other Ambulatory Visit: Payer: Self-pay | Admitting: Pharmacist

## 2015-10-19 NOTE — Patient Outreach (Signed)
Charles Hart is a 60 y.o. male referred to pharmacy by [facility] for medication adherence. Also note that per the patient's most recent visit with his PCP, Dr. Heber , on 10/15/15 the provider referred the patient to the internal medicine clinic clinical pharmacist for assistance with medication adherence.  Placed a coordination of care phone call and sent an InBasket message to [place] Pharmacist Flossie Dibble. If have not heard back from Dr. Maudie Mercury by 10/21/15, will follow up with her again at that time.  [name], PharmD Clinical Pharmacist [place] [facility] [phone]

## 2015-10-19 NOTE — Progress Notes (Signed)
Internal Medicine Clinic Attending  Case discussed with Dr. Hoffman at the time of the visit.  We reviewed the resident's history and exam and pertinent patient test results.  I agree with the assessment, diagnosis, and plan of care documented in the resident's note.  

## 2015-10-21 ENCOUNTER — Other Ambulatory Visit: Payer: Self-pay | Admitting: Pharmacist

## 2015-10-21 NOTE — Patient Outreach (Signed)
Received an Google back from [place] Pharmacist Flossie Dibble letting me know that she has an appointment scheduled with Mr. Gochnour for tomorrow, 10/22/15, to address medication adherence.   Will go ahead and close this pharmacy episode for now, as medication adherence is already being addressed.    [name], PharmD Clinical Pharmacist [facility] [place] [facility] [phone]

## 2015-10-22 ENCOUNTER — Ambulatory Visit: Payer: PPO | Admitting: Internal Medicine

## 2015-10-22 ENCOUNTER — Ambulatory Visit: Payer: PPO | Admitting: Pharmacist

## 2015-10-22 DIAGNOSIS — H4312 Vitreous hemorrhage, left eye: Secondary | ICD-10-CM | POA: Diagnosis not present

## 2015-10-22 DIAGNOSIS — E113592 Type 2 diabetes mellitus with proliferative diabetic retinopathy without macular edema, left eye: Secondary | ICD-10-CM | POA: Diagnosis not present

## 2015-10-22 DIAGNOSIS — H2511 Age-related nuclear cataract, right eye: Secondary | ICD-10-CM | POA: Diagnosis not present

## 2015-10-22 DIAGNOSIS — E113591 Type 2 diabetes mellitus with proliferative diabetic retinopathy without macular edema, right eye: Secondary | ICD-10-CM | POA: Diagnosis not present

## 2015-10-28 ENCOUNTER — Ambulatory Visit: Payer: PPO | Admitting: Internal Medicine

## 2015-10-28 ENCOUNTER — Ambulatory Visit: Payer: PPO | Admitting: Pharmacist

## 2015-10-28 DIAGNOSIS — Z0000 Encounter for general adult medical examination without abnormal findings: Secondary | ICD-10-CM

## 2015-10-28 NOTE — Progress Notes (Signed)
S: Charles Hart is a 60 y.o. male reports to clinical pharmacist appointment for medication assistance. Patient did bring medication bottles. Patient presents unaccompanied to visit and complains of flu-like symptoms. Was scheduled for physician visit today but missed appointment.   O:    Component Value Date/Time   CHOL 164 11/04/2014 1202   HDL 35* 11/04/2014 1202   TRIG 189* 11/04/2014 1202   AST 31 09/19/2015 0620   ALT 15* 09/19/2015 0620   NA 144 10/15/2015 1548   NA 136 09/21/2015 1221   K 3.7 10/15/2015 1548   CL 104 10/15/2015 1548   CO2 18 10/15/2015 1548   GLUCOSE 112* 10/15/2015 1548   GLUCOSE 125* 09/21/2015 1221   HGBA1C 6.1 10/15/2015 1622   HGBA1C 8.2* 07/04/2012 0155   BUN 36* 10/15/2015 1548   BUN 83* 09/21/2015 1221   CREATININE 2.94* 10/15/2015 1548   CREATININE 3.06* 11/26/2014 1201   CREATININE 1.35 03/04/2013 0745   CALCIUM 8.3* 10/15/2015 1548   GFRNONAA 22* 10/15/2015 1548   GFRNONAA 22* 11/26/2014 1201   GFRAA 26* 10/15/2015 1548   GFRAA 25* 11/26/2014 1201   WBC 5.4 09/20/2015 0436   HGB 10.0* 09/20/2015 0436   HCT 29.5* 09/20/2015 0436   PLT 156 09/20/2015 0436   TSH 2.782 07/04/2012 0142   Ht Readings from Last 2 Encounters:  09/28/15 6' (1.829 m)  09/19/15 6' (1.829 m)   Wt Readings from Last 2 Encounters:  10/15/15 232 lb 11.2 oz (105.552 kg)  09/28/15 230 lb 8 oz (104.554 kg)   There is no weight on file to calculate BMI. BP Readings from Last 3 Encounters:  10/15/15 161/74  09/28/15 133/65  09/21/15 138/68     A/P: A drug regimen assessment was performed, including review of allergies, interactions, disease-state management, and dosing. Patient seemingly had a solid grasp on medications, intended use and dosing regimen. Was aware of currently prescribed doses even in regard to differing instructions on medication bottles. Offered to organize patient's medications using a pillbox but reports using a pill box at home to help  keep track. Misses doses on occasion. Patient's pharmacy called to verify fill history and has been filling regularly. Reports no issues with obtaining medications. Medications were reviewed with the patient, including name, instructions, indication, goals of therapy, potential side effects, importance of adherence, and safe use.   Pt provided direct number to Internal Medicine Pharmacy office if any questions or concerns arise regarding medications. Will follow up as needed.   The patient verbalized understanding of information provided by repeating back concepts discussed.   20 minutes spent face-to-face with the patient during the encounter. 50% of time spent on education. 50% of time was spent on assessment and plan.

## 2015-10-29 NOTE — Progress Notes (Signed)
Patient was seen in clinic by Stephens November, PharmD, PGY1 pharmacy resident. I agree with the assessment and plan of care documented by Dr. Evette Doffing.

## 2015-10-30 ENCOUNTER — Ambulatory Visit (INDEPENDENT_AMBULATORY_CARE_PROVIDER_SITE_OTHER): Payer: PPO | Admitting: Internal Medicine

## 2015-10-30 ENCOUNTER — Encounter: Payer: Self-pay | Admitting: Internal Medicine

## 2015-10-30 VITALS — BP 136/65 | HR 66 | Temp 98.1°F | Ht 72.0 in | Wt 232.6 lb

## 2015-10-30 DIAGNOSIS — I110 Hypertensive heart disease with heart failure: Secondary | ICD-10-CM | POA: Diagnosis not present

## 2015-10-30 DIAGNOSIS — J069 Acute upper respiratory infection, unspecified: Secondary | ICD-10-CM | POA: Insufficient documentation

## 2015-10-30 DIAGNOSIS — R05 Cough: Secondary | ICD-10-CM

## 2015-10-30 DIAGNOSIS — I10 Essential (primary) hypertension: Secondary | ICD-10-CM

## 2015-10-30 DIAGNOSIS — I5043 Acute on chronic combined systolic (congestive) and diastolic (congestive) heart failure: Secondary | ICD-10-CM

## 2015-10-30 DIAGNOSIS — R0981 Nasal congestion: Secondary | ICD-10-CM

## 2015-10-30 LAB — GLUCOSE, CAPILLARY: Glucose-Capillary: 133 mg/dL — ABNORMAL HIGH (ref 65–99)

## 2015-10-30 MED ORDER — GUAIFENESIN ER 600 MG PO TB12: 600.0000 mg | ORAL_TABLET | Freq: Two times a day (BID) | ORAL | Status: DC

## 2015-10-30 NOTE — Progress Notes (Signed)
Subjective:   Patient ID: Charles Hart male   DOB: 02-09-1956 60 y.o.   MRN: [name]  HPI: [name] is a 60 y.o. male w/ PMHx of HTN, HLD, DM type II, CHF, and h/o CVA, presents to the clinic today for a follow-up visit. Patient was last seen on 10/15/15 for increasing weight and uncontrolled HTN. At that time, his Lasix was increased to 80 mg bid. Today his weight is stable. No SOB, chest pain, dizziness, or lightheadedness. BP well controlled. Taking his medications as prescribed.   Has had a cold recently with sinus congestion and very mild dry cough. Thinks his symptoms are improving. No fever, chills, nausea, or vomiting. No dyspnea.    Current Outpatient Prescriptions  Medication Sig Dispense Refill  . acetaminophen (TYLENOL) 500 MG tablet Take 1,000 mg by mouth every 6 (six) hours as needed (pain).     . BD PEN NEEDLE NANO U/F 32G X 4 MM MISC USE TO INJECT LANTUS ONCE A DAY AND NOVOLOG THREE TIMES DAILY 200 each 2  . carvedilol (COREG) 12.5 MG tablet Take 1 tablet (12.5 mg total) by mouth 2 (two) times daily. 60 tablet 6  . clopidogrel (PLAVIX) 75 MG tablet Take 1 tablet (75 mg total) by mouth daily. 90 tablet 3  . diltiazem (CARTIA XT) 180 MG 24 hr capsule Take 180 mg by mouth daily. Reported on 10/15/2015    . fluticasone (FLONASE) 50 MCG/ACT nasal spray Place 2 sprays into both nostrils daily as needed for allergies or rhinitis.    . furosemide (LASIX) 80 MG tablet Take 1 tablet (80 mg total) by mouth 2 (two) times daily. 60 tablet 2  . gabapentin (NEURONTIN) 300 MG capsule TAKE 1 CAPSULE(300 MG) BY MOUTH EVERY MORNING 90 capsule 2  . hydrALAZINE (APRESOLINE) 100 MG tablet Take 1 tablet (100 mg total) by mouth 3 (three) times daily. 90 tablet 11  . insulin aspart (NOVOLOG FLEXPEN) 100 UNIT/ML FlexPen Inject 7 Units into the skin 3 (three) times daily with meals. 15 mL 5  . Insulin Glargine (LANTUS SOLOSTAR) 100 UNIT/ML Solostar Pen Inject 30 Units into the skin  daily at 10 pm. (Patient taking differently: Inject 30 Units into the skin at bedtime. ) 15 mL 5  . isosorbide mononitrate (IMDUR) 60 MG 24 hr tablet TAKE 1 TABLET(60 MG) BY MOUTH DAILY 30 tablet 1  . ONE TOUCH ULTRA TEST test strip USE TO TEST BLOOD SUGAR THREE TIMES DAILY 100 each 11  . ONETOUCH DELICA LANCETS 99991111 MISC USE TO TEST BLOOD SUGAR THREE TIMES DAILY 100 each 11  . pravastatin (PRAVACHOL) 40 MG tablet Take 1 tablet (40 mg total) by mouth daily. 90 tablet 3  . senna-docusate (SENOKOT-S) 8.6-50 MG tablet Take 1 tablet by mouth at bedtime as needed for mild constipation. 30 tablet 0   No current facility-administered medications for this visit.    Family History  Problem Relation Age of Onset  . Colon cancer Sister   . Aneurysm Father 56    died of rupture    Social History   Social History  . Marital Status: Married    Spouse Name: N/A  . Number of Children: N/A  . Years of Education: N/A   Social History Main Topics  . Smoking status: Never Smoker   . Smokeless tobacco: Never Used  . Alcohol Use: No     Comment: Wine rarely.  . Drug Use: No  . Sexual Activity: Not Asked  Other Topics Concern  . None   Social History Narrative    Review of Systems  General: Denies fever, diaphoresis, appetite change, and fatigue.  Respiratory: Denies SOB, cough, and wheezing.   Cardiovascular: Denies chest pain and palpitations.  Gastrointestinal: Denies nausea, vomiting, abdominal pain, and diarrhea Musculoskeletal: Denies myalgias, arthralgias, back pain, and gait problem.  Neurological: Denies dizziness, syncope, weakness, lightheadedness, and headaches.  Psychiatric/Behavioral: Denies mood changes, sleep disturbance, and agitation.   Objective:   Physical Exam: Filed Vitals:   10/30/15 1537  BP: 136/65  Pulse: 66  Temp: 98.1 F (36.7 C)  TempSrc: Oral  Height: 6' (1.829 m)  Weight: 232 lb 9.6 oz (105.507 kg)  SpO2: 98%   General: Alert, cooperative,  NAD. HEENT: PERRL, EOMI. Moist mucus membranes Neck: Full range of motion without pain, supple, no lymphadenopathy or carotid bruits. No obvious JVD.  Lungs: Clear to ascultation bilaterally, normal work of respiration, no wheezes, rales, rhonchi Heart: RRR, no murmurs, gallops, or rubs Abdomen: Soft, non-tender, non-distended, BS + Extremities: No cyanosis, clubbing. Trace pitting edema.  Neurologic: Alert & oriented X3, cranial nerves II-XII intact, strength grossly intact, sensation intact to light touch   Assessment & Plan:   Please see problem based assessment and plan.

## 2015-10-30 NOTE — Patient Instructions (Signed)
1. Please make a follow up appointment in 2 weeks.   2. Please take all medications as previously prescribed with the following changes:  Continue Lasix 80 mg twice daily.   Someone will call you if we need to change your Lasix dose.   3. If you have worsening of your symptoms or new symptoms arise, please call the clinic [phone] or go to the ER immediately if symptoms are severe.

## 2015-10-31 ENCOUNTER — Telehealth: Payer: Self-pay | Admitting: Pulmonary Disease

## 2015-10-31 LAB — BMP8+ANION GAP
Anion Gap: 16 mmol/L (ref 10.0–18.0)
BUN/Creatinine Ratio: 18 (ref 9–20)
BUN: 62 mg/dL — ABNORMAL HIGH (ref 6–24)
CO2: 28 mmol/L (ref 18–29)
Calcium: 8.6 mg/dL — ABNORMAL LOW (ref 8.7–10.2)
Chloride: 95 mmol/L — ABNORMAL LOW (ref 96–106)
Creatinine, Ser: 3.42 mg/dL — ABNORMAL HIGH (ref 0.76–1.27)
GFR calc Af Amer: 22 mL/min/{1.73_m2} — ABNORMAL LOW (ref >59)
GFR, EST NON AFRICAN AMERICAN: 19 mL/min/{1.73_m2} — AB (ref >59)
Glucose: 136 mg/dL — ABNORMAL HIGH (ref 65–99)
Potassium: 3.9 mmol/L (ref 3.5–5.2)
SODIUM: 139 mmol/L (ref 134–144)

## 2015-10-31 NOTE — Telephone Encounter (Signed)
  Reason for call:   I placed an outgoing call to [name] at 12:10 PM regarding his lab results.   Assessment/ Plan:   He had stepped out, but his emergency contact, Charles Hart was available to speak. She confirmed his date of birth. She informed me that she manages his medications. She confirmed that he is getting furosemide 80mg  twice a day. I asked her to change his does to 80mg  daily. She would like to do 0.5 tablets (40mg ) twice a day. I agreed to this change.  As always, pt is advised that if symptoms worsen or new symptoms arise, they should go to an urgent care facility or to to ER for further evaluation.   Milagros Loll, MD   10/31/2015, 12:09 PM

## 2015-11-02 NOTE — Assessment & Plan Note (Addendum)
Recently had Lasix dose increased to 80 mg bid. Weight stable. Appears to be generally euvolemic. No apparent JVD, lungs sound clear, trace pitting edema. Cr checked, up to 3.42, increased from baseline (~3.0).  -Decrease Lasix to 40 mg bid (for continued BP effect) -Continue dilt, hydralazine, imdur, coreg -RTC in 2 weeks for follow up with PCP -May need higher dose of Lasix bid at baseline even though Cr may increase. Will have to determine at next visit.

## 2015-11-02 NOTE — Assessment & Plan Note (Signed)
BP controlled.  -Continue current meds.  -Decreased Lasix to 40 mg bid because of increased Cr. Will continue bid dosing for added BP control.

## 2015-11-02 NOTE — Assessment & Plan Note (Signed)
Patient with symptoms of viral URI. No SOB, fever, chills, nausea, or vomiting. Only sinus congestion and mild cough. Patient feels this is slowly resolving.  -Give Rx for Mucinex.

## 2015-11-03 NOTE — Progress Notes (Signed)
Internal Medicine Clinic Attending  Case discussed with Dr. Jones soon after the resident saw the patient.  We reviewed the resident's history and exam and pertinent patient test results.  I agree with the assessment, diagnosis, and plan of care documented in the resident's note. 

## 2015-11-13 ENCOUNTER — Ambulatory Visit (INDEPENDENT_AMBULATORY_CARE_PROVIDER_SITE_OTHER): Payer: PPO | Admitting: Internal Medicine

## 2015-11-13 ENCOUNTER — Encounter: Payer: Self-pay | Admitting: Internal Medicine

## 2015-11-13 VITALS — BP 123/62 | HR 65 | Temp 97.9°F | Ht 72.0 in | Wt 230.1 lb

## 2015-11-13 DIAGNOSIS — E1122 Type 2 diabetes mellitus with diabetic chronic kidney disease: Secondary | ICD-10-CM | POA: Diagnosis not present

## 2015-11-13 DIAGNOSIS — N183 Chronic kidney disease, stage 3 (moderate): Secondary | ICD-10-CM

## 2015-11-13 DIAGNOSIS — E785 Hyperlipidemia, unspecified: Secondary | ICD-10-CM

## 2015-11-13 DIAGNOSIS — Z8709 Personal history of other diseases of the respiratory system: Secondary | ICD-10-CM

## 2015-11-13 DIAGNOSIS — J069 Acute upper respiratory infection, unspecified: Secondary | ICD-10-CM

## 2015-11-13 DIAGNOSIS — M25662 Stiffness of left knee, not elsewhere classified: Secondary | ICD-10-CM

## 2015-11-13 DIAGNOSIS — I10 Essential (primary) hypertension: Secondary | ICD-10-CM | POA: Diagnosis not present

## 2015-11-13 DIAGNOSIS — M25661 Stiffness of right knee, not elsewhere classified: Secondary | ICD-10-CM

## 2015-11-13 DIAGNOSIS — Z79899 Other long term (current) drug therapy: Secondary | ICD-10-CM

## 2015-11-13 DIAGNOSIS — I129 Hypertensive chronic kidney disease with stage 1 through stage 4 chronic kidney disease, or unspecified chronic kidney disease: Secondary | ICD-10-CM | POA: Diagnosis not present

## 2015-11-13 DIAGNOSIS — M2560 Stiffness of unspecified joint, not elsewhere classified: Secondary | ICD-10-CM | POA: Insufficient documentation

## 2015-11-13 NOTE — Progress Notes (Signed)
Subjective:   Patient ID: Charles Hart male   DOB: [birth year] 60 y.o.   MRN: [id number]  HPI: Charles Hart is a 60 y.o. male w/ PMHx of HTN, HLD, DM type II, CHF, and h/o CVA, presents to the clinic today for a follow-up visit regarding his HTN and CHF. Patient is doing well today, weight is stable (actually down 2 lbs) and BP controlled. He is currently taking Lasix 80 mg once daily (as opposed to 40 mg bid as was previously specified). Making good urine, no complaints of chest pain, SOB, PND, or orthopnea. Says he goes to the gym regularly these days, trying to lose weight.   Current Outpatient Prescriptions  Medication Sig Dispense Refill  . acetaminophen (TYLENOL) 500 MG tablet Take 1,000 mg by mouth every 6 (six) hours as needed (pain).     . BD PEN NEEDLE NANO U/F 32G X 4 MM MISC USE TO INJECT LANTUS ONCE A DAY AND NOVOLOG THREE TIMES DAILY 200 each 2  . carvedilol (COREG) 12.5 MG tablet Take 1 tablet (12.5 mg total) by mouth 2 (two) times daily. 60 tablet 6  . clopidogrel (PLAVIX) 75 MG tablet Take 1 tablet (75 mg total) by mouth daily. 90 tablet 3  . diltiazem (CARTIA XT) 180 MG 24 hr capsule Take 180 mg by mouth daily. Reported on 10/15/2015    . fluticasone (FLONASE) 50 MCG/ACT nasal spray Place 2 sprays into both nostrils daily as needed for allergies or rhinitis.    . furosemide (LASIX) 80 MG tablet Take 1 tablet (80 mg total) by mouth 2 (two) times daily. 60 tablet 2  . gabapentin (NEURONTIN) 300 MG capsule TAKE 1 CAPSULE(300 MG) BY MOUTH EVERY MORNING 90 capsule 2  . guaiFENesin (MUCINEX) 600 MG 12 hr tablet Take 1 tablet (600 mg total) by mouth 2 (two) times daily. 30 tablet 0  . hydrALAZINE (APRESOLINE) 100 MG tablet Take 1 tablet (100 mg total) by mouth 3 (three) times daily. 90 tablet 11  . insulin aspart (NOVOLOG FLEXPEN) 100 UNIT/ML FlexPen Inject 7 Units into the skin 3 (three) times daily with meals. 15 mL 5  . Insulin Glargine (LANTUS SOLOSTAR) 100 UNIT/ML  Solostar Pen Inject 30 Units into the skin daily at 10 pm. (Patient taking differently: Inject 30 Units into the skin at bedtime. ) 15 mL 5  . isosorbide mononitrate (IMDUR) 60 MG 24 hr tablet TAKE 1 TABLET(60 MG) BY MOUTH DAILY 30 tablet 1  . ONE TOUCH ULTRA TEST test strip USE TO TEST BLOOD SUGAR THREE TIMES DAILY 100 each 11  . ONETOUCH DELICA LANCETS 99991111 MISC USE TO TEST BLOOD SUGAR THREE TIMES DAILY 100 each 11  . pravastatin (PRAVACHOL) 40 MG tablet Take 1 tablet (40 mg total) by mouth daily. 90 tablet 3  . senna-docusate (SENOKOT-S) 8.6-50 MG tablet Take 1 tablet by mouth at bedtime as needed for mild constipation. 30 tablet 0   No current facility-administered medications for this visit.    Review of Systems  General: Denies fever, diaphoresis, appetite change, and fatigue.  Respiratory: Denies SOB, cough, and wheezing.   Cardiovascular: Denies chest pain and palpitations.  Gastrointestinal: Denies nausea, vomiting, abdominal pain, and diarrhea Musculoskeletal: Denies myalgias, arthralgias, back pain, and gait problem.  Neurological: Denies dizziness, syncope, weakness, lightheadedness, and headaches.  Psychiatric/Behavioral: Denies mood changes, sleep disturbance, and agitation.   Objective:   Physical Exam: Filed Vitals:   11/13/15 1540  BP: 123/62  Pulse: 65  Temp:  97.9 F (36.6 C)  TempSrc: Oral  Height: 6' (1.829 m)  Weight: 230 lb 1.6 oz (104.373 kg)  SpO2: 99%     General: Overweight male, alert, cooperative, NAD. HEENT: PERRL, EOMI. Moist mucus membranes Neck: Full range of motion without pain, supple, no lymphadenopathy or carotid bruits. No obvious JVD.  Lungs: Clear to ascultation bilaterally, normal work of respiration, no wheezes, rales, rhonchi Heart: RRR, no murmurs, gallops, or rubs Abdomen: Soft, non-tender, non-distended, BS + Extremities: No cyanosis, clubbing. Trace pitting edema.  Neurologic: Alert & oriented X3, cranial nerves II-XII intact,  strength grossly intact, sensation intact to light touch   Assessment & Plan:   Please see problem based assessment and plan.

## 2015-11-13 NOTE — Patient Instructions (Signed)
1. Please make a follow up appointment for 4-6 weeks.   2. Please take all medications as previously prescribed with the following changes:  Continue Lasix 40 mg twice daily. You can take 80 mg once daily until you run out of this dose.   Please follow up with Dr. Lorrene Reid.   Someone will call you for Physical Therapy.   3. If you have worsening of your symptoms or new symptoms arise, please call the clinic [phone] or go to the ER immediately if symptoms are severe.

## 2015-11-14 LAB — BMP8+ANION GAP
Anion Gap: 18 mmol/L (ref 10.0–18.0)
BUN/Creatinine Ratio: 16 (ref 9–20)
BUN: 49 mg/dL — AB (ref 6–24)
CALCIUM: 8.7 mg/dL (ref 8.7–10.2)
CHLORIDE: 100 mmol/L (ref 96–106)
CO2: 24 mmol/L (ref 18–29)
Creatinine, Ser: 3.02 mg/dL — ABNORMAL HIGH (ref 0.76–1.27)
GFR calc Af Amer: 25 mL/min/{1.73_m2} — ABNORMAL LOW (ref >59)
GFR calc non Af Amer: 22 mL/min/{1.73_m2} — ABNORMAL LOW (ref >59)
Glucose: 91 mg/dL (ref 65–99)
Potassium: 4 mmol/L (ref 3.5–5.2)
Sodium: 142 mmol/L (ref 134–144)

## 2015-11-14 LAB — LIPID PANEL
CHOLESTEROL TOTAL: 146 mg/dL (ref 100–199)
Chol/HDL Ratio: 4.6 ratio units (ref 0.0–5.0)
HDL: 32 mg/dL — AB (ref >39)
LDL Calculated: 85 mg/dL (ref 0–99)
TRIGLYCERIDES: 145 mg/dL (ref 0–149)
VLDL CHOLESTEROL CAL: 29 mg/dL (ref 5–40)

## 2015-11-16 NOTE — Assessment & Plan Note (Signed)
BP Readings from Last 3 Encounters:  11/13/15 123/62  10/30/15 136/65  10/15/15 161/74    Lab Results  Component Value Date   NA 142 11/13/2015   K 4.0 11/13/2015   CREATININE 3.02* 11/13/2015    Assessment: Blood pressure control:  Controlled  Progress toward BP goal:   At goal Comments: Taking Imdur 60 mg daily, Hydralazine 100 mg tid, Lasix 80 mg once daily (once he runs out will take 40 mg bid), Coreg 12.5 mg bid, and Cardizem CD 180 mg daily.   Plan: Medications:  continue current medications Other plans: Cr stable, close to baseline. Will follow up with Dr. Lorrene Reid soon (per patient).

## 2015-11-17 NOTE — Progress Notes (Signed)
Internal Medicine Clinic Attending  Case discussed with Dr. Jones at the time of the visit.  We reviewed the resident's history and exam and pertinent patient test results.  I agree with the assessment, diagnosis, and plan of care documented in the resident's note.  

## 2015-11-17 NOTE — Assessment & Plan Note (Signed)
Lipid panel checked today. Continue Pravachol

## 2015-11-17 NOTE — Assessment & Plan Note (Signed)
Resolved

## 2015-11-17 NOTE — Assessment & Plan Note (Signed)
Patient complaining of some joint stiffness, mostly in his knees. Going to the gym. Think he would benefit from PT. -Order outpatient PT

## 2015-11-17 NOTE — Assessment & Plan Note (Signed)
Cr stable. Close to baseline. Will follow up with Dr. Lorrene Reid regarding his kidney function.

## 2015-11-24 DIAGNOSIS — H3562 Retinal hemorrhage, left eye: Secondary | ICD-10-CM | POA: Diagnosis not present

## 2015-11-24 DIAGNOSIS — H4312 Vitreous hemorrhage, left eye: Secondary | ICD-10-CM | POA: Diagnosis not present

## 2015-11-24 DIAGNOSIS — E113591 Type 2 diabetes mellitus with proliferative diabetic retinopathy without macular edema, right eye: Secondary | ICD-10-CM | POA: Diagnosis not present

## 2015-11-24 DIAGNOSIS — E113592 Type 2 diabetes mellitus with proliferative diabetic retinopathy without macular edema, left eye: Secondary | ICD-10-CM | POA: Diagnosis not present

## 2015-11-24 DIAGNOSIS — H43812 Vitreous degeneration, left eye: Secondary | ICD-10-CM | POA: Diagnosis not present

## 2015-12-01 DIAGNOSIS — H2511 Age-related nuclear cataract, right eye: Secondary | ICD-10-CM | POA: Diagnosis not present

## 2015-12-01 DIAGNOSIS — H2512 Age-related nuclear cataract, left eye: Secondary | ICD-10-CM | POA: Diagnosis not present

## 2015-12-22 ENCOUNTER — Other Ambulatory Visit: Payer: Self-pay | Admitting: Internal Medicine

## 2015-12-22 NOTE — Telephone Encounter (Signed)
I'm having a hard time finding out who prescribed this. Please advise.

## 2015-12-22 NOTE — Telephone Encounter (Signed)
Ok to refill 

## 2015-12-23 NOTE — Telephone Encounter (Signed)
Clonidine refill request. Per your note, patient is to wean off this medicine.

## 2015-12-24 NOTE — Telephone Encounter (Signed)
Agree with Clonidine off.

## 2015-12-31 DIAGNOSIS — H2512 Age-related nuclear cataract, left eye: Secondary | ICD-10-CM | POA: Diagnosis not present

## 2016-01-07 ENCOUNTER — Ambulatory Visit (INDEPENDENT_AMBULATORY_CARE_PROVIDER_SITE_OTHER): Payer: PPO | Admitting: Internal Medicine

## 2016-01-07 ENCOUNTER — Encounter: Payer: Self-pay | Admitting: Internal Medicine

## 2016-01-07 VITALS — BP 164/70 | HR 67 | Temp 98.2°F | Ht 72.0 in | Wt 234.3 lb

## 2016-01-07 DIAGNOSIS — M545 Low back pain, unspecified: Secondary | ICD-10-CM

## 2016-01-07 DIAGNOSIS — M1712 Unilateral primary osteoarthritis, left knee: Secondary | ICD-10-CM | POA: Diagnosis not present

## 2016-01-07 DIAGNOSIS — N183 Chronic kidney disease, stage 3 (moderate): Secondary | ICD-10-CM

## 2016-01-07 DIAGNOSIS — E1122 Type 2 diabetes mellitus with diabetic chronic kidney disease: Secondary | ICD-10-CM

## 2016-01-07 DIAGNOSIS — I10 Essential (primary) hypertension: Secondary | ICD-10-CM | POA: Diagnosis not present

## 2016-01-07 DIAGNOSIS — Z794 Long term (current) use of insulin: Secondary | ICD-10-CM | POA: Diagnosis not present

## 2016-01-07 DIAGNOSIS — N529 Male erectile dysfunction, unspecified: Secondary | ICD-10-CM

## 2016-01-07 MED ORDER — ISOSORBIDE MONONITRATE ER 60 MG PO TB24: ORAL_TABLET | ORAL | Status: DC

## 2016-01-07 MED ORDER — CLONIDINE HCL 0.1 MG PO TABS: ORAL_TABLET | ORAL | Status: DC

## 2016-01-07 NOTE — Patient Instructions (Signed)
I want you to take Clonidine 0.2mg  twice a day for 1 week Then go down to taking 0.1mg  twice a day for 1 week Then stop taking clonidine  Go back to taking IMDUR 60mg  daily.

## 2016-01-08 LAB — MICROALBUMIN / CREATININE URINE RATIO
Creatinine, Urine: 42 mg/dL
MICROALB/CREAT RATIO: 2836.7 mg/g{creat} — AB (ref 0.0–30.0)
MICROALBUM., U, RANDOM: 1191.4 ug/mL

## 2016-01-12 NOTE — Assessment & Plan Note (Signed)
A: HTN, uncontrolled  P: Return IMDUR to 60mg  daily Will still work to wean off clondine>> again discussed in detail this plan, will have him go down to 0.2mg  twice daily x 1 week then 0.1mg  twice daily x 1 week then off.

## 2016-01-12 NOTE — Progress Notes (Signed)
Charles Hart Subjective:   Patient ID: Charles Hart male   DOB: March 15, 1956 60 y.o.   MRN: [id number]  HPI: Charles Hart is a 60 y.o. male with a PMH detailed below who presents for 2 month follow up of HTN.  Charles Hart has difficult to control HTN and has had to be admitted on multiple occasions for HTN urgency/emergency.  After the last episode in Jan we have worked to get him off clonidine as this is the likely culprit for his admissions.  I however was concerned when he requested another refill from the pharmacy for clonidine and called him in for a visit. He reports that he has been taking clonidine 0.3 mg twice a day, when he ran out last week he started using 0.1mg  tablets that I had previously prescribed (for weaning off) taking 2 tablets twice a day. He does bring his medications today, another issue that was noted is that he has a bottle of IMDUR 30mg  instead of 60 prescribed by his cardiologist.  He is not sure why the pharmacy gave him the 30mg  tablets.   Please see problem based charting below for the status of his chronic medical problems   Past Medical History  Diagnosis Date  . Dermatitis   . CHF (congestive heart failure) (HCC)     LV function improved from 2004 to 2008.  Historically, moderately dilated LV with EF 30-40% by 2D echo 08/14/2002.  Mild CAD with severe LV dysfunction by cardiac cath 09/2002.  Charles coronary arteries and Charles LV function by cardiac cath 09/19/2006.  A 2-D echo on 04/01/2009 showed mild concentric hypertrophy and Charles systolic (LVEF  123456) and doppler C/W with grade 1 diastolic dysfunction..   . Hypertension   . Hyperlipidemia   . Hearing loss in right ear   . Cardiomyopathy     LV function improved from 2004 to 2008.  Historically, moderately dilated LV with EF 30-40% by 2D echo 08/14/2002.  Mild CAD with severe LV dysfunction by cardiac cath 09/2002.  Charles coronary arteries and Charles LV function by cardiac  cath 09/19/2006.  A 2-D echo on 04/01/2009 showed mild concentric hypertrophy and Charles systolic (LVEF  123456) and doppler C/W with grade 1 diastolic dysfunction.  . DM neuropathy, painful ([place])   . CVA (cerebral vascular accident) ([place]) 07/04/2012    MRI of the brain 07/04/2012 showed an acute infarct in the right basal ganglia involving the anterior putamen, anterior limb internal capsule, and head of the caudate; this measured approximately 2.5 cm in diameter.     . Hypertensive crisis 07/28/2012  . Myocardial infarction ([place])   . Diabetes mellitus     type 2  . Hypertensive urgency 08/20/2014  . Adenomatous colon polyp 07/02/2011    Last colonoscopy May 06, 2011 by Dr. Owens Loffler, who recommended repeat colonoscopy in 5 years.   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 Background diabetic retinopathy 04/20/2012    Patient is followed by Dr. Katy Fitch   . Chronic combined systolic and diastolic congestive heart failure ([place]) 05/21/2010    LV function improved from 2004 to 2008.  Historically, moderately dilated LV with EF 30-40% by 2D echo 08/14/2002.  Mild CAD with severe LV dysfunction by cardiac cath 09/2002.  Charles coronary arteries and Charles LV function by cardiac cath 09/19/2006.  A 2-D echo on 04/01/2009 showed mild concentric hypertrophy and Charles systolic (  LVEF  60-65%) and doppler parameters consistent with abnormal left   . Nephrotic syndrome 02/18/2013    A 24-hour urine collection 03/04/2013 showed total protein of 5,460 g and creatinine clearance of 80 mL/minute.  Patient was seen by Seward Meth at [place] and a repeat 24-hour urine showed 10,407 mg protein.  Patient underwent kidney biopsy on 05/30/2013; pathology showed advanced diffuse and nodular diabetic nephropathy with vascular changes consistent with long-standing difficult to control hypertension.      Current Outpatient  Prescriptions  Medication Sig Dispense Refill  . acetaminophen (TYLENOL) 500 MG tablet Take 1,000 mg by mouth every 6 (six) hours as needed (pain).     . BD PEN NEEDLE NANO U/F 32G X 4 MM MISC USE TO INJECT LANTUS ONCE A DAY AND NOVOLOG THREE TIMES DAILY 200 each 2  . CARTIA XT 180 MG 24 hr capsule TAKE 1 CAPSULE(180 MG) BY MOUTH DAILY 90 capsule 0  . carvedilol (COREG) 12.5 MG tablet Take 1 tablet (12.5 mg total) by mouth 2 (two) times daily. 60 tablet 6  . cloNIDine (CATAPRES) 0.1 MG tablet Take 2 tabs twice a day for 1 week, then 1 tablet once a day for 1 week then stop. 42 tablet 0  . clopidogrel (PLAVIX) 75 MG tablet Take 1 tablet (75 mg total) by mouth daily. 90 tablet 3  . fluticasone (FLONASE) 50 MCG/ACT nasal spray Place 2 sprays into both nostrils daily as needed for allergies or rhinitis.    . furosemide (LASIX) 80 MG tablet Take 1 tablet (80 mg total) by mouth 2 (two) times daily. 60 tablet 2  . gabapentin (NEURONTIN) 300 MG capsule TAKE 1 CAPSULE(300 MG) BY MOUTH EVERY MORNING 90 capsule 2  . guaiFENesin (MUCINEX) 600 MG 12 hr tablet Take 1 tablet (600 mg total) by mouth 2 (two) times daily. 30 tablet 0  . hydrALAZINE (APRESOLINE) 100 MG tablet Take 1 tablet (100 mg total) by mouth 3 (three) times daily. 90 tablet 11  . insulin aspart (NOVOLOG FLEXPEN) 100 UNIT/ML FlexPen Inject 7 Units into the skin 3 (three) times daily with meals. 15 mL 5  . Insulin Glargine (LANTUS SOLOSTAR) 100 UNIT/ML Solostar Pen Inject 30 Units into the skin daily at 10 pm. (Patient taking differently: Inject 30 Units into the skin at bedtime. ) 15 mL 5  . isosorbide mononitrate (IMDUR) 60 MG 24 hr tablet TAKE 1 TABLET(60 MG) BY MOUTH DAILY 30 tablet 5  . ONE TOUCH ULTRA TEST test strip USE TO TEST BLOOD SUGAR THREE TIMES DAILY 100 each 11  . ONETOUCH DELICA LANCETS 99991111 MISC USE TO TEST BLOOD SUGAR THREE TIMES DAILY 100 each 11  . pravastatin (PRAVACHOL) 40 MG tablet Take 1 tablet (40 mg total) by mouth daily.  90 tablet 3  . senna-docusate (SENOKOT-S) 8.6-50 MG tablet Take 1 tablet by mouth at bedtime as needed for mild constipation. 30 tablet 0   No current facility-administered medications for this visit.   Family History  Problem Relation Age of Onset  . Colon cancer Sister   . Aneurysm Father 56    died of rupture   Social History   Social History  . Marital Status: Married    Spouse Name: N/A  . Number of Children: N/A  . Years of Education: N/A   Social History Main Topics  . Smoking status: Never Smoker   . Smokeless tobacco: Never Used  . Alcohol Use: No     Comment: Wine  rarely.  . Drug Use: No  . Sexual Activity: Not Asked   Other Topics Concern  . None   Social History Narrative   Review of Systems: Review of Systems  Constitutional: Negative for fever and chills.  Respiratory: Negative for cough, shortness of breath and wheezing.   Cardiovascular: Negative for chest pain.  Gastrointestinal: Negative for heartburn and abdominal pain.  Genitourinary: Negative for dysuria.  Musculoskeletal: Negative for myalgias and falls.  Neurological: Negative for focal weakness and headaches.     Objective:  Physical Exam: Filed Vitals:   01/07/16 1530  BP: 164/70  Pulse: 67  Temp: 98.2 F (36.8 C)  TempSrc: Oral  Height: 6' (1.829 m)  Weight: 234 lb 4.8 oz (106.278 kg)  SpO2: 100%   Physical Exam  Constitutional: He is well-developed, well-nourished, and in no distress.  Cardiovascular: Charles rate and regular rhythm.   Pulmonary/Chest: Effort Charles and breath sounds Charles.  Abdominal: Soft. Bowel sounds are Charles.  Musculoskeletal: He exhibits no edema.       Left knee: He exhibits Charles range of motion, no swelling, no effusion, no LCL laxity, Charles meniscus and no MCL laxity. Tenderness found. Medial joint line tenderness noted.       Lumbar back: He exhibits Charles range of motion, no tenderness, no swelling, no pain and no spasm.  SLR negative  bilaterally  Nursing note and vitals reviewed.    Assessment & Plan:  Case discussed with Dr. Eppie Gibson  Essential hypertension A: HTN, uncontrolled  P: Return IMDUR to 60mg  daily Will still work to wean off clondine>> again discussed in detail this plan, will have him go down to 0.2mg  twice daily x 1 week then 0.1mg  twice daily x 1 week then off.  ED (erectile dysfunction) S: He reports difficulty maintaing errections, wants to know if he can have a medication for this  A: ED  P: This may be due to his DM (cardiovascular or neurologic) or due to medications, currently he is on a long acting nitrate and not a candidate for PDE inhibitors.  Will first see how this improves with stopping the clondine.  Low back pain S: He reports low back pain that is typically worse at the end of the day or after a long day while laying down.  Patient denies warning symptoms of back pain including: fecal incontinence, urinary retention or overflow incontinence, night sweats, waking from sleep with back pain, unexplained fevers or weight loss, h/o cancer, IVDU, recent trauma.  Tylenol helps some however he is taking the max dose and not having sufficient relief. He is requesting a back brace.  A: Low back pain without sciatica.  P: Continue tylenol May try back brace to see if it helps, completed paperwork that patient brought.   Primary osteoarthritis of left knee S: Patient complaints of left knee pain, has been going on for years, tylenol helps some but not enough.  15 minutes of joint stiffness in am. Patient wants knee brace, feels it will help.  A: Primary OA of left knee  P: Offer steroid injection, patient declined as he does not like needles Completed paperwork for knee brace.    Medications Ordered Meds ordered this encounter  Medications  . cloNIDine (CATAPRES) 0.1 MG tablet    Sig: Take 2 tabs twice a day for 1 week, then 1 tablet once a day for 1 week then stop.    Dispense:  42  tablet    Refill:  0  .  isosorbide mononitrate (IMDUR) 60 MG 24 hr tablet    Sig: TAKE 1 TABLET(60 MG) BY MOUTH DAILY    Dispense:  30 tablet    Refill:  5   Other Orders Orders Placed This Encounter  Procedures  . Microalbumin / Creatinine Urine Ratio   Follow Up: Return in about 1 month (around 02/07/2016).

## 2016-01-12 NOTE — Assessment & Plan Note (Signed)
S: He reports low back pain that is typically worse at the end of the day or after a long day while laying down.  Patient denies warning symptoms of back pain including: fecal incontinence, urinary retention or overflow incontinence, night sweats, waking from sleep with back pain, unexplained fevers or weight loss, h/o cancer, IVDU, recent trauma.  Tylenol helps some however he is taking the max dose and not having sufficient relief. He is requesting a back brace.  A: Low back pain without sciatica.  P: Continue tylenol May try back brace to see if it helps, completed paperwork that patient brought.

## 2016-01-12 NOTE — Assessment & Plan Note (Signed)
S: He reports difficulty maintaing errections, wants to know if he can have a medication for this  A: ED  P: This may be due to his DM (cardiovascular or neurologic) or due to medications, currently he is on a long acting nitrate and not a candidate for PDE inhibitors.  Will first see how this improves with stopping the clondine.

## 2016-01-12 NOTE — Assessment & Plan Note (Signed)
S: Patient complaints of left knee pain, has been going on for years, tylenol helps some but not enough.  15 minutes of joint stiffness in am. Patient wants knee brace, feels it will help.  A: Primary OA of left knee  P: Offer steroid injection, patient declined as he does not like needles Completed paperwork for knee brace.

## 2016-01-13 NOTE — Progress Notes (Signed)
Case discussed with Dr. Hoffman at the time of the visit.  We reviewed the resident's history and exam and pertinent patient test results.  I agree with the assessment, diagnosis and plan of care documented in the resident's note. 

## 2016-01-13 NOTE — Addendum Note (Signed)
Addended by: Oval Linsey D on: 01/13/2016 08:54 AM   Modules accepted: Level of Service

## 2016-01-29 DIAGNOSIS — E669 Obesity, unspecified: Secondary | ICD-10-CM | POA: Diagnosis not present

## 2016-01-29 DIAGNOSIS — E785 Hyperlipidemia, unspecified: Secondary | ICD-10-CM | POA: Diagnosis not present

## 2016-01-29 DIAGNOSIS — E1322 Other specified diabetes mellitus with diabetic chronic kidney disease: Secondary | ICD-10-CM | POA: Diagnosis not present

## 2016-01-29 DIAGNOSIS — I129 Hypertensive chronic kidney disease with stage 1 through stage 4 chronic kidney disease, or unspecified chronic kidney disease: Secondary | ICD-10-CM | POA: Diagnosis not present

## 2016-01-29 DIAGNOSIS — N2581 Secondary hyperparathyroidism of renal origin: Secondary | ICD-10-CM | POA: Diagnosis not present

## 2016-01-29 DIAGNOSIS — E1129 Type 2 diabetes mellitus with other diabetic kidney complication: Secondary | ICD-10-CM | POA: Diagnosis not present

## 2016-01-29 DIAGNOSIS — D649 Anemia, unspecified: Secondary | ICD-10-CM | POA: Diagnosis not present

## 2016-01-29 DIAGNOSIS — N184 Chronic kidney disease, stage 4 (severe): Secondary | ICD-10-CM | POA: Diagnosis not present

## 2016-01-29 DIAGNOSIS — I5040 Unspecified combined systolic (congestive) and diastolic (congestive) heart failure: Secondary | ICD-10-CM | POA: Diagnosis not present

## 2016-02-02 DIAGNOSIS — E113592 Type 2 diabetes mellitus with proliferative diabetic retinopathy without macular edema, left eye: Secondary | ICD-10-CM | POA: Diagnosis not present

## 2016-02-02 DIAGNOSIS — E113591 Type 2 diabetes mellitus with proliferative diabetic retinopathy without macular edema, right eye: Secondary | ICD-10-CM | POA: Diagnosis not present

## 2016-02-02 DIAGNOSIS — H43812 Vitreous degeneration, left eye: Secondary | ICD-10-CM | POA: Diagnosis not present

## 2016-02-02 DIAGNOSIS — H4312 Vitreous hemorrhage, left eye: Secondary | ICD-10-CM | POA: Diagnosis not present

## 2016-02-08 ENCOUNTER — Encounter: Payer: Self-pay | Admitting: Internal Medicine

## 2016-02-08 ENCOUNTER — Ambulatory Visit: Payer: PPO | Admitting: Pharmacist

## 2016-02-08 ENCOUNTER — Ambulatory Visit (INDEPENDENT_AMBULATORY_CARE_PROVIDER_SITE_OTHER): Payer: PPO | Admitting: Internal Medicine

## 2016-02-08 VITALS — BP 181/99 | HR 75 | Temp 98.4°F | Ht 72.0 in | Wt 234.5 lb

## 2016-02-08 DIAGNOSIS — I5032 Chronic diastolic (congestive) heart failure: Secondary | ICD-10-CM | POA: Diagnosis not present

## 2016-02-08 DIAGNOSIS — Z794 Long term (current) use of insulin: Secondary | ICD-10-CM

## 2016-02-08 DIAGNOSIS — N184 Chronic kidney disease, stage 4 (severe): Secondary | ICD-10-CM

## 2016-02-08 DIAGNOSIS — N183 Chronic kidney disease, stage 3 unspecified: Secondary | ICD-10-CM

## 2016-02-08 DIAGNOSIS — E1122 Type 2 diabetes mellitus with diabetic chronic kidney disease: Secondary | ICD-10-CM | POA: Diagnosis not present

## 2016-02-08 DIAGNOSIS — I130 Hypertensive heart and chronic kidney disease with heart failure and stage 1 through stage 4 chronic kidney disease, or unspecified chronic kidney disease: Secondary | ICD-10-CM

## 2016-02-08 DIAGNOSIS — I429 Cardiomyopathy, unspecified: Secondary | ICD-10-CM

## 2016-02-08 DIAGNOSIS — I10 Essential (primary) hypertension: Secondary | ICD-10-CM

## 2016-02-08 DIAGNOSIS — N2581 Secondary hyperparathyroidism of renal origin: Secondary | ICD-10-CM

## 2016-02-08 DIAGNOSIS — Z79899 Other long term (current) drug therapy: Secondary | ICD-10-CM

## 2016-02-08 LAB — GLUCOSE, CAPILLARY: Glucose-Capillary: 150 mg/dL — ABNORMAL HIGH (ref 65–99)

## 2016-02-08 LAB — POCT GLYCOSYLATED HEMOGLOBIN (HGB A1C): HEMOGLOBIN A1C: 6.4

## 2016-02-08 NOTE — Progress Notes (Signed)
Patient states he needs refills on medications. Contacted [facility] and patient is past-due on furosemide, hydralazine, carvedilol, and insulin glargine (all of these were last filled 12/30/15 for 30-day supplies).  Medication Samples have been provided to the patient. Drug name: Insulin glargine (Lantus)       Strength: 100 units/mL        Qty: 1  LOT: [id number]  Exp.Date: 01/2018 Dosing instructions: inject 30 units daily  The patient has been instructed regarding the correct time, dose, and frequency of taking this medication, including desired effects and most common side effects.   Patient advised to bring in all medications to next visit and offered to have a medication visit with him. Patient stated he will plan to do this at his follow up.  Kim,Jennifer J 9:47 AM 02/08/2016

## 2016-02-08 NOTE — Patient Instructions (Addendum)
General Instructions:  Please take you medications every day as directed.  I would also like you to get a blood pressure cuff and monitor your blood pressure at home.  The goal is less than 140 on the top number and less than 90 on the bottom number.  Please bring your medicines with you each time you come to clinic.  Medicines may include prescription medications, over-the-counter medications, herbal remedies, eye drops, vitamins, or other pills.   Progress Toward Treatment Goals:  Treatment Goal 02/08/2016  Hemoglobin A1C at goal  Blood pressure deteriorated    Self Care Goals & Plans:  Self Care Goal 02/08/2016  Manage my medications take my medicines as prescribed; bring my medications to every visit; refill my medications on time  Monitor my health bring my glucose meter and log to each visit; keep track of my blood pressure; bring my blood pressure log to each visit  Eat healthy foods eat more vegetables; eat foods that are low in salt; eat baked foods instead of fried foods  Be physically active find an activity I enjoy  Meeting treatment goals maintain the current self-care plan    Home Blood Glucose Monitoring 02/08/2016  Check my blood sugar 2 times a day  When to check my blood sugar before breakfast; before dinner     Care Management & Community Referrals:  Referral 02/08/2016  Referrals made for care management support pharmacy clinic; [facility] case management program

## 2016-02-08 NOTE — Progress Notes (Signed)
[facility] Subjective:   Patient ID: Charles Hart male   DOB: 21-Jul-1956 60 y.o.   MRN: [name]  HPI: [name] is a 60 y.o. male with a PMH detailed below who presents for 1 month follow up for HTN.  He is concerned about starting Dialysis, he saw his nephrologist last week and she brought up discussions about vascular access planning,  He notes that he does not want to die and if needed he would start dialysis but only when he absolutely has to.  He reports to me that he has not taken his blood pressure medications today or yesterday.  He reports he forgot.  He denies any HA, blurry vision, chest pain, abdominal pain, urinary frequency or polydipsia.  His urine is sometimes frothy.  Past Medical History  Diagnosis Date  . Dermatitis   . CHF (congestive heart failure) (HCC)     LV function improved from 2004 to 2008.  Historically, moderately dilated LV with EF 30-40% by 2D echo 08/14/2002.  Mild CAD with severe LV dysfunction by cardiac cath 09/2002.  Normal coronary arteries and normal LV function by cardiac cath 09/19/2006.  A 2-D echo on 04/01/2009 showed mild concentric hypertrophy and normal systolic (LVEF  123456) and doppler C/W with grade 1 diastolic dysfunction..   . Hypertension   . Hyperlipidemia   . Hearing loss in right ear   . Cardiomyopathy     LV function improved from 2004 to 2008.  Historically, moderately dilated LV with EF 30-40% by 2D echo 08/14/2002.  Mild CAD with severe LV dysfunction by cardiac cath 09/2002.  Normal coronary arteries and normal LV function by cardiac cath 09/19/2006.  A 2-D echo on 04/01/2009 showed mild concentric hypertrophy and normal systolic (LVEF  123456) and doppler C/W with grade 1 diastolic dysfunction.  . DM neuropathy, painful ([place])   . CVA (cerebral vascular accident) ([place]) 07/04/2012    MRI of the brain 07/04/2012 showed an acute infarct in the right basal ganglia involving the anterior putamen,  anterior limb internal capsule, and head of the caudate; this measured approximately 2.5 cm in diameter.     . Hypertensive crisis 07/28/2012  . Myocardial infarction ([place])   . Diabetes mellitus     type 2  . Hypertensive urgency 08/20/2014  . Adenomatous colon polyp 07/02/2011    Last colonoscopy May 06, 2011 by Dr. Owens Loffler, who recommended repeat colonoscopy in 5 years.   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 Background diabetic retinopathy 04/20/2012    Patient is followed by Dr. Katy Fitch   . Chronic combined systolic and diastolic congestive heart failure ([place]) 05/21/2010    LV function improved from 2004 to 2008.  Historically, moderately dilated LV with EF 30-40% by 2D echo 08/14/2002.  Mild CAD with severe LV dysfunction by cardiac cath 09/2002.  Normal coronary arteries and normal LV function by cardiac cath 09/19/2006.  A 2-D echo on 04/01/2009 showed mild concentric hypertrophy and normal systolic (LVEF  123456) and doppler parameters consistent with abnormal left   . Nephrotic syndrome 02/18/2013    A 24-hour urine collection 03/04/2013 showed total protein of 5,460 g and creatinine clearance of 80 mL/minute.  Patient was seen by Charles Hart at [facility] and a repeat 24-hour urine showed 10,407 mg protein.  Patient underwent kidney biopsy on 05/30/2013; pathology showed advanced diffuse and nodular diabetic  nephropathy with vascular changes consistent with long-standing difficult to control hypertension.      Current Outpatient Prescriptions  Medication Sig Dispense Refill  . acetaminophen (TYLENOL) 500 MG tablet Take 1,000 mg by mouth every 6 (six) hours as needed (pain).     . BD PEN NEEDLE NANO U/F 32G X 4 MM MISC USE TO INJECT LANTUS ONCE A DAY AND NOVOLOG THREE TIMES DAILY 200 each 2  . CARTIA XT 180 MG 24 hr capsule TAKE 1 CAPSULE(180 MG) BY MOUTH DAILY 90 capsule 0  . carvedilol (COREG)  12.5 MG tablet Take 1 tablet (12.5 mg total) by mouth 2 (two) times daily. 60 tablet 6  . clopidogrel (PLAVIX) 75 MG tablet Take 1 tablet (75 mg total) by mouth daily. 90 tablet 3  . fluticasone (FLONASE) 50 MCG/ACT nasal spray Place 2 sprays into both nostrils daily as needed for allergies or rhinitis.    . furosemide (LASIX) 80 MG tablet Take 1 tablet (80 mg total) by mouth 2 (two) times daily. 60 tablet 2  . gabapentin (NEURONTIN) 300 MG capsule TAKE 1 CAPSULE(300 MG) BY MOUTH EVERY MORNING 90 capsule 2  . guaiFENesin (MUCINEX) 600 MG 12 hr tablet Take 1 tablet (600 mg total) by mouth 2 (two) times daily. 30 tablet 0  . hydrALAZINE (APRESOLINE) 100 MG tablet Take 1 tablet (100 mg total) by mouth 3 (three) times daily. 90 tablet 11  . insulin aspart (NOVOLOG FLEXPEN) 100 UNIT/ML FlexPen Inject 7 Units into the skin 3 (three) times daily with meals. 15 mL 5  . Insulin Glargine (LANTUS SOLOSTAR) 100 UNIT/ML Solostar Pen Inject 30 Units into the skin daily at 10 pm. (Patient taking differently: Inject 30 Units into the skin at bedtime. ) 15 mL 5  . isosorbide mononitrate (IMDUR) 60 MG 24 hr tablet TAKE 1 TABLET(60 MG) BY MOUTH DAILY 30 tablet 5  . ONE TOUCH ULTRA TEST test strip USE TO TEST BLOOD SUGAR THREE TIMES DAILY 100 each 11  . ONETOUCH DELICA LANCETS [id number] MISC USE TO TEST BLOOD SUGAR THREE TIMES DAILY 100 each 11  . pravastatin (PRAVACHOL) 40 MG tablet Take 1 tablet (40 mg total) by mouth daily. 90 tablet 3  . senna-docusate (SENOKOT-S) 8.6-50 MG tablet Take 1 tablet by mouth at bedtime as needed for mild constipation. 30 tablet 0   No current facility-administered medications for this visit.   Family History  Problem Relation Age of Onset  . Colon cancer Sister   . Aneurysm Father 80    died of rupture   Social History   Social History  . Marital Status: Married    Spouse Name: N/A  . Number of Children: N/A  . Years of Education: N/A   Social History Main Topics  . Smoking  status: Never Smoker   . Smokeless tobacco: Never Used  . Alcohol Use: No     Comment: Wine rarely.  . Drug Use: No  . Sexual Activity: Not Asked   Other Topics Concern  . None   Social History Narrative   Review of Systems: Per HPI  Objective:  Physical Exam: Filed Vitals:   02/08/16 0910  BP: 181/99  Pulse: 75  Temp: 98.4 F (36.9 C)  TempSrc: Oral  Height: 6' (1.829 m)  Weight: 234 lb 8 oz (106.369 kg)  SpO2: 100%  Physical Exam  Constitutional: He is well-developed, well-nourished, and in no distress.  HENT:  Head: Normocephalic and atraumatic.  Cardiovascular: Normal rate, regular rhythm and normal  heart sounds.   Pulmonary/Chest: Effort normal and breath sounds normal.  Abdominal: Soft. Bowel sounds are normal.  Musculoskeletal: He exhibits edema (1+ bilateral lower extremity).  Neurological: He is alert.  Skin: Skin is warm and dry.  Psychiatric: Affect normal.  Nursing note and vitals reviewed.   Assessment & Plan:  Case discussed with Dr. Lynnae January  Essential hypertension A: Essential HTN  P: His blood pressure has been very difficult to control due I believe in large part to varying adherence with his blood pressure medications,  He does not appear to grasp the importance of blood pressure control to preserving kidney function,  I did try to spend time today discussing the importance of blood pressure control to delay the start of dialysis but I am unsure how effective this is. I have also ask our clinical pharmacist to see him and assist in following along to try to increase adherence with his medications.  I am glad that he is now off clonidine as I think this is too risky of a medication with his variable adherence. Today will will continue Cartia 180mg  daily, Coreg 12.5mg  BID, Lasix 80mg  BID, Hydralazine 100mg  TID, and IMDUR 60mg  daily.  His blood pressure was recently well controlled at his nephrology visit one week ago when I suspect he was taking at  least more of his medications.  Cardiomyopathy A: Chronic diastolic CHF  P: Appears stable but will benefit from better blood pressure control.  DM (diabetes mellitus), type 2 with renal complications (HCC) A: Type 2 DM with CKD stage 4  P: A1c is at goal but with his CKD this may be falsely low, unfortunately he does not have his meter for review which would likely be more accurate.  In the absence of other information will continue his current dose of Novolog at 7 units TID and Lantus at 30 units daily.  CKD stage 4 due to type 2 diabetes mellitus ([place]) A: CKD stage 4 due to DM  P: I have reviewed the note of Dr Lorrene Reid from 01/29/16.  Mr Linsey has a degree of denial about his renal function and trouble accepting that dialysis is in his near future.  I did discuss the need for vascular access planning with Mr Estrela and preparation for dialysis,  I stressed that increased compliance with his blood pressure medication is the way to delay the start of dialysis. I have also placed a repeat referral to [facility] to see if they can assist with some of the medication adherence issues.  Secondary hyperparathyroidism, renal ([place]) PTH of 98 at nephrology visit, plan to hold off and monitor for now    Medications Ordered No orders of the defined types were placed in this encounter.   Other Orders Orders Placed This Encounter  Procedures  . Glucose, capillary  . AMB Referral to [place] [facility] [facility]    Referral Priority:  Routine    Referral Type:  Consultation    Referral Reason:  [facility]    Number of Visits Requested:  1  . POCT HgB A1C (CPT [phone])   Follow Up: Return in about 1 month (around 03/09/2016).

## 2016-02-08 NOTE — Progress Notes (Signed)
Physician pharmacist co-visit

## 2016-02-09 ENCOUNTER — Other Ambulatory Visit: Payer: Self-pay

## 2016-02-09 DIAGNOSIS — N2581 Secondary hyperparathyroidism of renal origin: Secondary | ICD-10-CM | POA: Insufficient documentation

## 2016-02-09 NOTE — Assessment & Plan Note (Signed)
A: CKD stage 4 due to DM  P: I have reviewed the note of Dr Lorrene Reid from 01/29/16.  Charles Hart has a degree of denial about his renal function and trouble accepting that dialysis is in his near future.  I did discuss the need for vascular access planning with Charles Hart and preparation for dialysis,  I stressed that increased compliance with his blood pressure medication is the way to delay the start of dialysis. I have also placed a repeat referral to [facility] to see if they can assist with some of the medication adherence issues.

## 2016-02-09 NOTE — Assessment & Plan Note (Signed)
A: Type 2 DM with CKD stage 4  P: A1c is at goal but with his CKD this may be falsely low, unfortunately he does not have his meter for review which would likely be more accurate.  In the absence of other information will continue his current dose of Novolog at 7 units TID and Lantus at 30 units daily.

## 2016-02-09 NOTE — Assessment & Plan Note (Signed)
A: Chronic diastolic CHF  P: Appears stable but will benefit from better blood pressure control.

## 2016-02-09 NOTE — Progress Notes (Signed)
Internal Medicine Clinic Attending  Case discussed with Dr. Hoffman soon after the resident saw the patient.  We reviewed the resident's history and exam and pertinent patient test results.  I agree with the assessment, diagnosis, and plan of care documented in the resident's note. 

## 2016-02-09 NOTE — Patient Outreach (Signed)
[facility]) Care Management  02/09/2016  DEANDREW SEANEY [birth year] [name]   Telephone Screen  Referral Date: 02/08/16 Referral Source: MD office Referral Reason: " Uncontrolled blood pressure, diagnosis of kidney failure, diabetes and heart failure"   Outreach attempt #1 to patient. A male answered the phone but when asked to speak with patient hung up.   Plan: RN CM will make outreach attempt to patient within a week.   Enzo Montgomery, RN,BSN,CCM [place] [facility] Telephonic Care Management Coordinator Direct Phone: [phone] Toll Free: [phone] Fax: [phone]

## 2016-02-09 NOTE — Assessment & Plan Note (Signed)
PTH of 98 at nephrology visit, plan to hold off and monitor for now

## 2016-02-09 NOTE — Assessment & Plan Note (Addendum)
A: Essential HTN  P: His blood pressure has been very difficult to control due I believe in large part to varying adherence with his blood pressure medications,  He does not appear to grasp the importance of blood pressure control to preserving kidney function,  I did try to spend time today discussing the importance of blood pressure control to delay the start of dialysis but I am unsure how effective this is. I have also ask our clinical pharmacist to see him and assist in following along to try to increase adherence with his medications.  I am glad that he is now off clonidine as I think this is too risky of a medication with his variable adherence. Today will will continue Cartia 180mg  daily, Coreg 12.5mg  BID, Lasix 80mg  BID, Hydralazine 100mg  TID, and IMDUR 60mg  daily.  His blood pressure was recently well controlled at his nephrology visit one week ago when I suspect he was taking at least more of his medications.

## 2016-02-15 ENCOUNTER — Other Ambulatory Visit: Payer: Self-pay

## 2016-02-15 DIAGNOSIS — I10 Essential (primary) hypertension: Secondary | ICD-10-CM

## 2016-02-15 NOTE — Patient Outreach (Signed)
[facility]) Care Management  02/15/2016  Charles Hart 1956-01-13 [name]   Telephone Screen  Referral Date: 02/08/16 Referral Source: MD office Referral Reason: " Uncontrolled blood pressure, diagnosis of kidney failure, diabetes and heart failure"  Outreach attempt #2 to patient. Spoke with patient. Screening completed.  Social: Patient resides in his home with spouse. He is independent with ADLs/IADLs. He drives himself to medical appts. He denies any falls. DME in the home include cbg meter and BP monitor.    Conditions: Patient has h/o: uncontrolled HTN DM, CHF, CKD, HLD, CVA, MI and cardiomyopathy. Patient states that he checks BP at home "ometimes." he reports that BP runs in the 0000000 systolic. He denies being symptomatic with elevated BP. He voices checking cbgs infrequently. He reports that cbgs normally run in the 150s. He is taking insulin 3x/day.   Medications: Patient voices that he is taking more than15 meds. He reports difficulty affording meds at times. Denies being out of meds at this time.   Appointments: He saw PCP on 02/09/16. He is also followed by Dr. Barbra Sarks) and Dr. Nishan(cardiologist).  Consent: Patient gave verbal consent for [facility] services.   Plan: RN CM will notify [facility] administrative assistant of case status. RN CM will send [facility] community referral for further in home eval and assessment of care needs. RN CM will send [facility] pharmacy referral for polypharmacy and possible med assistance.  Enzo Montgomery, RN,BSN,CCM [place] [facility] Telephonic Care Management Coordinator Direct Phone: [phone] Toll Free: [phone] Fax: [phone]

## 2016-02-17 ENCOUNTER — Telehealth: Payer: Self-pay | Admitting: *Deleted

## 2016-02-17 ENCOUNTER — Emergency Department (HOSPITAL_COMMUNITY): Payer: PPO

## 2016-02-17 ENCOUNTER — Emergency Department (HOSPITAL_COMMUNITY)
Admission: EM | Admit: 2016-02-17 | Discharge: 2016-02-17 | Disposition: A | Discharge status: Home / Self Care | Payer: PPO | Attending: Emergency Medicine | Admitting: Emergency Medicine

## 2016-02-17 ENCOUNTER — Ambulatory Visit: Payer: PPO

## 2016-02-17 ENCOUNTER — Other Ambulatory Visit: Payer: Self-pay

## 2016-02-17 ENCOUNTER — Encounter (HOSPITAL_COMMUNITY): Payer: Self-pay | Admitting: Emergency Medicine

## 2016-02-17 ENCOUNTER — Telehealth: Payer: Self-pay

## 2016-02-17 DIAGNOSIS — I252 Old myocardial infarction: Secondary | ICD-10-CM | POA: Insufficient documentation

## 2016-02-17 DIAGNOSIS — Z794 Long term (current) use of insulin: Secondary | ICD-10-CM | POA: Insufficient documentation

## 2016-02-17 DIAGNOSIS — Z79899 Other long term (current) drug therapy: Secondary | ICD-10-CM | POA: Diagnosis not present

## 2016-02-17 DIAGNOSIS — Z8673 Personal history of transient ischemic attack (TIA), and cerebral infarction without residual deficits: Secondary | ICD-10-CM | POA: Insufficient documentation

## 2016-02-17 DIAGNOSIS — I509 Heart failure, unspecified: Secondary | ICD-10-CM | POA: Insufficient documentation

## 2016-02-17 DIAGNOSIS — E1121 Type 2 diabetes mellitus with diabetic nephropathy: Secondary | ICD-10-CM | POA: Diagnosis not present

## 2016-02-17 DIAGNOSIS — R0602 Shortness of breath: Secondary | ICD-10-CM | POA: Diagnosis not present

## 2016-02-17 DIAGNOSIS — I110 Hypertensive heart disease with heart failure: Secondary | ICD-10-CM | POA: Insufficient documentation

## 2016-02-17 LAB — BASIC METABOLIC PANEL
Anion gap: 7 (ref 5–15)
BUN: 40 mg/dL — ABNORMAL HIGH (ref 6–20)
CALCIUM: 8.3 mg/dL — AB (ref 8.9–10.3)
CO2: 23 mmol/L (ref 22–32)
CREATININE: 3.17 mg/dL — AB (ref 0.61–1.24)
Chloride: 108 mmol/L (ref 101–111)
GFR calc non Af Amer: 20 mL/min — ABNORMAL LOW (ref >60)
GFR, EST AFRICAN AMERICAN: 23 mL/min — AB (ref >60)
Glucose, Bld: 148 mg/dL — ABNORMAL HIGH (ref 65–99)
Potassium: 3.9 mmol/L (ref 3.5–5.1)
SODIUM: 138 mmol/L (ref 135–145)

## 2016-02-17 LAB — I-STAT TROPONIN, ED
TROPONIN I, POC: 0.04 ng/mL (ref 0.00–0.08)
TROPONIN I, POC: 0.04 ng/mL (ref 0.00–0.08)

## 2016-02-17 LAB — CBC
HCT: 32.7 % — ABNORMAL LOW (ref 39.0–52.0)
Hemoglobin: 10.6 g/dL — ABNORMAL LOW (ref 13.0–17.0)
MCH: 28.3 pg (ref 26.0–34.0)
MCHC: 32.4 g/dL (ref 30.0–36.0)
MCV: 87.2 fL (ref 78.0–100.0)
PLATELETS: 282 10*3/uL (ref 150–400)
RBC: 3.75 MIL/uL — AB (ref 4.22–5.81)
RDW: 12.8 % (ref 11.5–15.5)
WBC: 4.4 10*3/uL (ref 4.0–10.5)

## 2016-02-17 LAB — BRAIN NATRIURETIC PEPTIDE: B Natriuretic Peptide: 434.7 pg/mL — ABNORMAL HIGH (ref 0.0–100.0)

## 2016-02-17 MED ORDER — ASPIRIN 325 MG PO TABS
325.0000 mg | ORAL_TABLET | Freq: Once | ORAL | Status: AC
  Administered 2016-02-17: 325 mg via ORAL
  Filled 2016-02-17: qty 1

## 2016-02-17 MED ORDER — FUROSEMIDE 10 MG/ML IJ SOLN
80.0000 mg | Freq: Once | INTRAMUSCULAR | Status: AC
  Administered 2016-02-17: 80 mg via INTRAVENOUS
  Filled 2016-02-17: qty 8

## 2016-02-17 NOTE — ED Notes (Signed)
Pt sts increased SOB x 4 days; pt sts thinks could be because he is not taking his clonidine; pt denies increased swelling

## 2016-02-17 NOTE — ED Provider Notes (Signed)
CSN: [id number]     Arrival date & time 02/17/16  1344 History   First MD Initiated Contact with Patient 02/17/16 1405     Chief Complaint  Patient presents with  . Shortness of Breath     (Consider location/radiation/quality/duration/timing/severity/associated sxs/prior Treatment) Patient is a 60 y.o. male presenting with shortness of breath.  Shortness of Breath Severity:  Moderate Duration:  5 days Timing:  Constant Progression:  Worsening Chronicity:  New Context: not activity and not pollens   Relieved by:  None tried Worsened by:  Nothing tried Ineffective treatments:  None tried Associated symptoms: cough     Past Medical History  Diagnosis Date  . Dermatitis   . CHF (congestive heart failure) (HCC)     LV function improved from 2004 to 2008.  Historically, moderately dilated LV with EF 30-40% by 2D echo 08/14/2002.  Mild CAD with severe LV dysfunction by cardiac cath 09/2002.  Normal coronary arteries and normal LV function by cardiac cath 09/19/2006.  A 2-D echo on 04/01/2009 showed mild concentric hypertrophy and normal systolic (LVEF  123456) and doppler C/W with grade 1 diastolic dysfunction..   . Hypertension   . Hyperlipidemia   . Hearing loss in right ear   . Cardiomyopathy     LV function improved from 2004 to 2008.  Historically, moderately dilated LV with EF 30-40% by 2D echo 08/14/2002.  Mild CAD with severe LV dysfunction by cardiac cath 09/2002.  Normal coronary arteries and normal LV function by cardiac cath 09/19/2006.  A 2-D echo on 04/01/2009 showed mild concentric hypertrophy and normal systolic (LVEF  123456) and doppler C/W with grade 1 diastolic dysfunction.  . DM neuropathy, painful ([place])   . CVA (cerebral vascular accident) ([place]) 07/04/2012    MRI of the brain 07/04/2012 showed an acute infarct in the right basal ganglia involving the anterior putamen, anterior limb internal capsule, and head of the caudate; this measured approximately 2.5 cm in diameter.      . Hypertensive crisis 07/28/2012  . Myocardial infarction ([place])   . Diabetes mellitus     type 2  . Hypertensive urgency 08/20/2014  . Adenomatous colon polyp 07/02/2011    Last colonoscopy May 06, 2011 by Dr. Owens Loffler, who recommended repeat colonoscopy in 5 years.   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 Background diabetic retinopathy 04/20/2012    Patient is followed by Dr. Katy Fitch   . Chronic combined systolic and diastolic congestive heart failure ([place]) 05/21/2010    LV function improved from 2004 to 2008.  Historically, moderately dilated LV with EF 30-40% by 2D echo 08/14/2002.  Mild CAD with severe LV dysfunction by cardiac cath 09/2002.  Normal coronary arteries and normal LV function by cardiac cath 09/19/2006.  A 2-D echo on 04/01/2009 showed mild concentric hypertrophy and normal systolic (LVEF  123456) and doppler parameters consistent with abnormal left   . Nephrotic syndrome 02/18/2013    A 24-hour urine collection 03/04/2013 showed total protein of 5,460 g and creatinine clearance of 80 mL/minute.  Patient was seen by Seward Meth at [facility] and a repeat 24-hour urine showed 10,407 mg protein.  Patient underwent kidney biopsy on 05/30/2013; pathology showed advanced diffuse and nodular diabetic nephropathy with vascular changes consistent with long-standing difficult to control hypertension.      Past Surgical History  Procedure Laterality Date  . Colonoscopy    .  Polypectomy    . Cardiac catheterization      3 times  . Foot surgery     Family History  Problem Relation Age of Onset  . Colon cancer Sister   . Aneurysm Father 10    died of rupture   Social History  Substance Use Topics  . Smoking status: Never Smoker   . Smokeless tobacco: Never Used  . Alcohol Use: No     Comment: Wine rarely.    Review of Systems  Respiratory: Positive for cough and shortness of  breath (PND).   All other systems reviewed and are negative.     Allergies  Amlodipine; Ivp dye; Lisinopril; and Red dye  Home Medications   Prior to Admission medications   Medication Sig Start Date End Date Taking? Authorizing Provider  acetaminophen (TYLENOL) 500 MG tablet Take 1,000 mg by mouth every 6 (six) hours as needed (pain).    Yes Historical Provider, MD  BD PEN NEEDLE NANO U/F 32G X 4 MM MISC USE TO INJECT LANTUS ONCE A DAY AND NOVOLOG THREE TIMES DAILY 06/16/15  Yes Lucious Groves, DO  CARTIA XT 180 MG 24 hr capsule TAKE 1 CAPSULE(180 MG) BY MOUTH DAILY 12/22/15  Yes Lucious Groves, DO  carvedilol (COREG) 12.5 MG tablet Take 1 tablet (12.5 mg total) by mouth 2 (two) times daily. 10/15/15  Yes Lucious Groves, DO  clopidogrel (PLAVIX) 75 MG tablet Take 1 tablet (75 mg total) by mouth daily. 10/15/15  Yes Lucious Groves, DO  furosemide (LASIX) 80 MG tablet Take 1 tablet (80 mg total) by mouth 2 (two) times daily. Patient taking differently: Take 80 mg by mouth every morning. And may take an additional 40 mg later in the day as a second dose for marked swelling/edema 10/15/15  Yes Lucious Groves, DO  gabapentin (NEURONTIN) 300 MG capsule TAKE 1 CAPSULE(300 MG) BY MOUTH EVERY MORNING Patient taking differently: Take 300 mg by mouth at bedtime 06/16/15  Yes Lucious Groves, DO  hydrALAZINE (APRESOLINE) 100 MG tablet Take 1 tablet (100 mg total) by mouth 3 (three) times daily. 10/15/15 10/14/16 Yes Lucious Groves, DO  insulin aspart (NOVOLOG FLEXPEN) 100 UNIT/ML FlexPen Inject 7 Units into the skin 3 (three) times daily with meals. Patient taking differently: Inject 7 Units into the skin 3 (three) times daily with meals. Or sliding scale is used when patient has access to glucometer: If BGL is 150 or greater, 1 unit is given for every 50 points (above a reading of 150) 10/15/15  Yes Lucious Groves, DO  Insulin Glargine (LANTUS SOLOSTAR) 100 UNIT/ML Solostar Pen Inject 30 Units into the skin daily at  10 pm. Patient taking differently: Inject 30 Units into the skin at bedtime.  07/02/15  Yes Lucious Groves, DO  isosorbide mononitrate (IMDUR) 60 MG 24 hr tablet TAKE 1 TABLET(60 MG) BY MOUTH DAILY 01/07/16  Yes Lucious Groves, DO  Multiple Vitamins-Minerals (ONE-A-DAY MENS 50+ ADVANTAGE PO) Take 1 tablet by mouth daily with breakfast.   Yes Historical Provider, MD  ONE TOUCH ULTRA TEST test strip USE TO TEST BLOOD SUGAR THREE TIMES DAILY 07/16/15  Yes Lucious Groves, DO  ONETOUCH DELICA LANCETS 99991111 MISC USE TO TEST BLOOD SUGAR THREE TIMES DAILY 07/16/15  Yes Lucious Groves, DO  pravastatin (PRAVACHOL) 40 MG tablet Take 1 tablet (40 mg total) by mouth daily. 10/15/15  Yes Lucious Groves, DO   BP 116/73 mmHg  Pulse  69  Temp(Src) 98.4 F (36.9 C)  Resp 18  SpO2 96% Physical Exam  Constitutional: He is oriented to person, place, and time. He appears well-developed and well-nourished.  HENT:  Head: Normocephalic and atraumatic.  Neck: Normal range of motion.  Cardiovascular: Normal rate.   Pulmonary/Chest: Effort normal. No respiratory distress.  Abdominal: Soft. He exhibits no distension. There is no tenderness.  Musculoskeletal: Normal range of motion. He exhibits no edema or tenderness.  Neurological: He is alert and oriented to person, place, and time.  Skin: Skin is warm and dry. No rash noted. No erythema.  Nursing note and vitals reviewed.   ED Course  Procedures (including critical care time) Labs Review Labs Reviewed  BASIC METABOLIC PANEL - Abnormal; Notable for the following:    Glucose, Bld 148 (*)    BUN 40 (*)    Creatinine, Ser 3.17 (*)    Calcium 8.3 (*)    GFR calc non Af Amer 20 (*)    GFR calc Af Amer 23 (*)    All other components within normal limits  CBC - Abnormal; Notable for the following:    RBC 3.75 (*)    Hemoglobin 10.6 (*)    HCT 32.7 (*)    All other components within normal limits  BRAIN NATRIURETIC PEPTIDE - Abnormal; Notable for the following:     B Natriuretic Peptide 434.7 (*)    All other components within normal limits  I-STAT TROPOININ, ED  Randolm Idol, ED    Imaging Review Dg Chest 2 View  02/17/2016  CLINICAL DATA:  Shortness of breath for a week EXAM: CHEST  2 VIEW COMPARISON:  09/20/2015 FINDINGS: Stable borderline cardiomegaly. Negative aortic and hilar contours. There is no edema, consolidation, effusion, or pneumothorax. Prominent thoracic spondylosis. No acute osseous finding. IMPRESSION: No active cardiopulmonary disease. Electronically Signed   By: Monte Fantasia M.D.   On: 02/17/2016 14:11   I have personally reviewed and evaluated these images and lab results as part of my medical decision-making.   EKG Interpretation   Date/Time:  Wednesday February 17 2016 16:58:38 EDT Ventricular Rate:  64 PR Interval:  164 QRS Duration: 108 QT Interval:  451 QTC Calculation: 466 R Axis:   2 Text Interpretation:  Sinus rhythm Left atrial enlargement LVH with  secondary repolarization abnormality Tall R wave in V2, consider RVH or  PMI Anterior ST elevation, probably due to LVH no significant change from  multiple previous ecg's today Confirmed by [facility] MD, Corene Cornea [phone]) on  02/17/2016 5:03:36 PM      MDM   Final diagnoses:  Acute on chronic congestive heart failure, unspecified congestive heart failure type (HCC)   Dyspnea likely 2/2 chf exacerbation, but mild. Has LVH with likely repol abnormalities on ECG, doubt active ischemia but will check delta troponins. No chest pain.    delta troponins negative, ecg unchanged without evidence of ischemia, all making ACS unlikely.  Low Well's Score, doubt PE.  No e/o Pneumonia, no indication for antibiotics.  Based on H&P, low concern for dissection, pneumothorax, pericarditis, boerhaves or other emergent causes for their symptoms at this time.   Will follow up with cardiologist in a few days for further medication changes/reevaluation, otherwise will return ehre for for  worsening symptoms.   New Prescriptions: New Prescriptions   No medications on file     I have personally and contemperaneously reviewed labs and imaging and used in my decision making as above.   A medical screening  exam was performed and I feel the patient has had an appropriate workup for their chief complaint at this time and likelihood of emergent condition existing is low and thus workup can continue on an outpatient basis.. Their vital signs are stable. They have been counseled on decision, discharge, follow up and which symptoms necessitate immediate return to the emergency department.  They verbally stated understanding and agreement with plan and discharged in stable condition.       Merrily Pew, MD 02/17/16 [phone]

## 2016-02-17 NOTE — Telephone Encounter (Signed)
Patient walk-in complaining of SOB. Informed patient of our walk-in policy, and that he should go to ED for evaluation. Informed patient that we should call EMS to take him over to ED. Patient stated I can take myself over. Informed patient that I could not stop him from driving over to the ED, but I would not advise it. Patient signed a [facility] against medical advice. Called ED to let them know patient was coming over.

## 2016-02-17 NOTE — Discharge Instructions (Signed)
Please double your Lasix for the next 3 days and follow-up with your cardiologist next week to ensure improvement. After those 3 days start to give Lasix once a day as you are now.

## 2016-02-19 ENCOUNTER — Other Ambulatory Visit: Payer: Self-pay | Admitting: Pharmacist

## 2016-02-19 DIAGNOSIS — Z599 Problem related to housing and economic circumstances, unspecified: Secondary | ICD-10-CM

## 2016-02-19 DIAGNOSIS — Z598 Other problems related to housing and economic circumstances: Secondary | ICD-10-CM

## 2016-02-19 NOTE — Patient Outreach (Addendum)
[name] was referred to pharmacy for medication assistance. Note previously met with Mr. Maniaci regarding medication assistance on 12/31/14 to complete the extra help application. Patient reports that he is again having trouble with affording all of his medications.  Per the Check enrollment tool on the Medicare.gov website, patient currently has full extra help.  Patient reports that he currently has all of his medications. Reports that he just picked them up and that they cost him about $35-40 for all. Talk with patient about setting aside $40 each month when he receives his social security paycheck. Patient reports that he is having trouble with managing all of his medical costs and other expenses. Reports that with medical visit copays, eye glasses and other expenses, he is having difficulty with not running out of money each month. Patient expresses interest in speaking with a social worker about strategies for managing his expenses.  Note that this patient continues to be followed by Flossie Dibble, Clinical Pharmacist with the [facility].  Patient reports that he has no further medication questions/concerns at this time. Patient confirms that he has my phone number.  PLAN:  1) Will send a referral for social work to direct patient to resources for helping to manage his expenses.  2) Will close pharmacy episode at this time.  [name], PharmD Clinical Pharmacist [facility] [phone]

## 2016-02-22 ENCOUNTER — Other Ambulatory Visit: Payer: Self-pay | Admitting: Licensed Clinical Social Worker

## 2016-02-22 NOTE — Patient Outreach (Signed)
[facility]) Care Management  02/22/2016  Charles Hart February 14, 1956 [id number]   Assessment- CSW completed outreach on 02/22/16 after receiving referral today for financial resource education to help patient with budgeting. CSW introduced self, reason for call and of [facility] social work services. Patient provided HIPPA verifications. Patient reports having difficulty affording medical co pays, eye glasses and other expenses. Patient states that he has applied for Medicaid twice and has been denied. He reports his monthly incomes is $875.00 but that he as two cars in his name which he states caused him to be denied. Patient reports that he is looking to work again. He states that his wife works and that he income is $1,500 per month. CSW educated patient on [employer] and their eligibility requirements. Patient does not receive any help from [employer] so he would not qualify for program. Patient was denied food stamps. Patient denies any other social work needs such as transportation.  CSW educated patient on available financial assistance resources. CSW questioned if he would like resources mailed to him with follow up education call or a home visit and he with like for CSW to complete home visit to provide resources and education.  Plan-CSW will complete home visit on 02/26/16.  Eula Fried, BSW, MSW, [email or web address] Phone: [phone] Fax: [phone]

## 2016-02-23 DIAGNOSIS — M545 Low back pain: Secondary | ICD-10-CM | POA: Diagnosis not present

## 2016-02-24 ENCOUNTER — Other Ambulatory Visit: Payer: Self-pay

## 2016-02-24 DIAGNOSIS — M1712 Unilateral primary osteoarthritis, left knee: Secondary | ICD-10-CM | POA: Diagnosis not present

## 2016-02-24 NOTE — Patient Outreach (Signed)
unsuccessful attempt made to contact patient for initial telephone assessment. Unable to leave HIPPA compliant message because there was no message recording pickup.  This RNCM will make another attempt to contact patient later this week.

## 2016-02-26 ENCOUNTER — Other Ambulatory Visit: Payer: Self-pay

## 2016-02-26 ENCOUNTER — Encounter: Payer: Self-pay | Admitting: Licensed Clinical Social Worker

## 2016-02-26 ENCOUNTER — Other Ambulatory Visit: Payer: Self-pay | Admitting: Licensed Clinical Social Worker

## 2016-02-26 NOTE — Patient Outreach (Addendum)
[facility]) Care Management  [facility] Social Work  02/26/2016  ANDRA MATSUO 15-Aug-1956 [id number]   Encounter Medications:  Outpatient Encounter Prescriptions as of 02/26/2016  Medication Sig Note  . acetaminophen (TYLENOL) 500 MG tablet Take 1,000 mg by mouth every 6 (six) hours as needed (pain).    . BD PEN NEEDLE NANO U/F 32G X 4 MM MISC USE TO INJECT LANTUS ONCE A DAY AND NOVOLOG THREE TIMES DAILY   . CARTIA XT 180 MG 24 hr capsule TAKE 1 CAPSULE(180 MG) BY MOUTH DAILY   . carvedilol (COREG) 12.5 MG tablet Take 1 tablet (12.5 mg total) by mouth 2 (two) times daily.   . clopidogrel (PLAVIX) 75 MG tablet Take 1 tablet (75 mg total) by mouth daily.   . furosemide (LASIX) 80 MG tablet Take 1 tablet (80 mg total) by mouth 2 (two) times daily. (Patient taking differently: Take 80 mg by mouth every morning. And may take an additional 40 mg later in the day as a second dose for marked swelling/edema)   . gabapentin (NEURONTIN) 300 MG capsule TAKE 1 CAPSULE(300 MG) BY MOUTH EVERY MORNING (Patient taking differently: Take 300 mg by mouth at bedtime)   . hydrALAZINE (APRESOLINE) 100 MG tablet Take 1 tablet (100 mg total) by mouth 3 (three) times daily.   . insulin aspart (NOVOLOG FLEXPEN) 100 UNIT/ML FlexPen Inject 7 Units into the skin 3 (three) times daily with meals. (Patient taking differently: Inject 7 Units into the skin 3 (three) times daily with meals. Or sliding scale is used when patient has access to glucometer: If BGL is 150 or greater, 1 unit is given for every 50 points (above a reading of 150)) 02/17/2016: Dosage verified by patient and wife Or sliding scale is used when patient has access to glucometer: If BGL is 150 or greater, 1 unit is given for every 50 points (above a reading of 150)  . Insulin Glargine (LANTUS SOLOSTAR) 100 UNIT/ML Solostar Pen Inject 30 Units into the skin daily at 10 pm. (Patient taking differently: Inject 30 Units into the skin at bedtime. )  02/17/2016: Dosage verified by patient and wife  . isosorbide mononitrate (IMDUR) 60 MG 24 hr tablet TAKE 1 TABLET(60 MG) BY MOUTH DAILY   . Multiple Vitamins-Minerals (ONE-A-DAY MENS 50+ ADVANTAGE PO) Take 1 tablet by mouth daily with breakfast.   . ONE TOUCH ULTRA TEST test strip USE TO TEST BLOOD SUGAR THREE TIMES DAILY   . ONETOUCH DELICA LANCETS 35W MISC USE TO TEST BLOOD SUGAR THREE TIMES DAILY   . pravastatin (PRAVACHOL) 40 MG tablet Take 1 tablet (40 mg total) by mouth daily.    No facility-administered encounter medications on file as of 02/26/2016.    Functional Status:  In your present state of health, do you have any difficulty performing the following activities: 02/08/2016 01/07/2016  Hearing? Tempie Donning  Vision? Y Y  Difficulty concentrating or making decisions? N N  Walking or climbing stairs? N N  Dressing or bathing? N N  Doing errands, shopping? N N    Fall/Depression Screening:  PHQ 2/9 Scores 02/26/2016 02/15/2016 02/08/2016 01/07/2016 11/13/2015 10/30/2015 09/28/2015  PHQ - 2 Score 0 0 0 0 0 0 0    Assessment: CSW completed home visit today on 02/26/16 with patient present. Patient is having financial difficulties and wishes to have social work involvement to assist with learning how to budget and gain resources. Patient has been denied both food stamps and Medicaid. Patient denies  any depressive symptoms. Patient has stable transportation and drives himself to medical appointments. Patient reports that his joint income with his spouse is $47,375 with his spouse earning $1,500 per month working full time and his income being $69 from [place]. CSW provided patient with three different budget worksheets and assisted him with completing one which outlined his incomes and subtracted his bills and medical expenses. Patient states that his rent is $1350. Patient reports that his family has three cars: one paid for in full, one is $300 per month and the last one is $505 per month. Patient states that  he pays $600 in car insurance. Patient reports that one of his cars is almost paid off. Patient shares that he does not know how much his electric, water, cable, internet, etc is as his spouse usually pays for those. CSW is unable to complete budget worksheet due to the lack of financial information. Family is paying $805 dollars in car payments and $600 in car insurance. CSW questioned if he felt like he could liquidate possessions such as a car but he shares he does not think that there is anything that the family can live without. Patient reports that family has three cars in case one car were to break down. Patient provided hand copies of financial resources, consumer credit counseling information, food pantries, where to gain a free meal daily in the community information, medicaid eligibility information, medical bill assistance resources and senior resources. CSW completed extensive review of all resources. Patient strongly encouraged to go to credit consumer counseling. Patient reports that he would like to eventual work again as he feels he is physically able to. Patient shares that he was apart a program called [employer] [facility] [employer] which helped to employ him when he resided in [place]. Patient plans to attend a monthly meeting for this next week in order to re-enroll in services. CSW provided information on [employer] technologist. CSW also provided EMMI documents in regards to his health diagnoses and ways to implement healthy eating habits.   [facility] CM Care Plan Problem One        Most Recent Value   Care Plan Problem One  Lack of understanding in terms of financial resources   Role Documenting the Problem One  Clinical Social Worker   Care Plan for Problem One  Active   [facility] CM Short Term Goal #1 (0-30 days)  Patient willl be able to identify three financial resources that he can use within one week    [facility] CM Short Term Goal #1 Start Date   02/22/16   [facility] CM Short Term Goal #1 Met Date  02/26/16   Interventions for Short Term Goal #1  Patient was able to successfully read back financial resource information of three agencies that had been previously provided to him during initial phone call.   [facility] CM Short Term Goal #2 (0-30 days)  In 30 days, patient will complete budget form provided to him by CSW as evidenced by his desire to learn how to budget due to financial concerns with medical expenses.   [facility] CM Short Term Goal #2 Start Date  02/26/16   Interventions for Short Term Goal #2  CSW has completed home visit and has provided three budget forms and assisted him in starting document. CSW provided resources for him to use. CSW will monitor this process.    [facility] CM Short Term Goal #3 (0-30 days)  In  30 days patient will discuss financial resources as evidenced by his financial concerns with medical bills and spouse was not present during home visit   [facility] CM Short Term Goal #3 Start Date  02/26/16   Interventions for Short Tern Goal #3  CSW completed home visit and spent extensive amount of time educating him on financial resources that can aide him. CSW will monitor this process and educate him as needed.      Plan: CSW will route encounter to PCP. CSW will send involvement letter to PCP. CSW will follow up with patient in one month.  Eula Fried, BSW, MSW, [email or web address] Phone: [phone] Fax: [phone]

## 2016-02-26 NOTE — Patient Outreach (Signed)
This RNCM was successful in making contact with patient to schedule home visit. Patient identified himself by providing date of birth and address.  Patient reports a knowledge deficit related to diabetes and heart failure management. Patient and RNCM collaborated to formulate his case management care plan with goals to target increasing his knowledge base related to the management of the previously stated chronic illnesses.  Plan: Home visit later this month for Heart Failure and Diabetes Education.

## 2016-03-03 ENCOUNTER — Ambulatory Visit (INDEPENDENT_AMBULATORY_CARE_PROVIDER_SITE_OTHER): Payer: PPO | Admitting: Internal Medicine

## 2016-03-03 ENCOUNTER — Encounter: Payer: Self-pay | Admitting: Internal Medicine

## 2016-03-03 ENCOUNTER — Telehealth: Payer: Self-pay

## 2016-03-03 VITALS — BP 142/70 | HR 77 | Temp 98.2°F | Ht 72.0 in | Wt 227.9 lb

## 2016-03-03 DIAGNOSIS — M25551 Pain in right hip: Secondary | ICD-10-CM | POA: Diagnosis not present

## 2016-03-03 DIAGNOSIS — I10 Essential (primary) hypertension: Secondary | ICD-10-CM

## 2016-03-03 DIAGNOSIS — G5701 Lesion of sciatic nerve, right lower limb: Secondary | ICD-10-CM | POA: Diagnosis not present

## 2016-03-03 DIAGNOSIS — E1122 Type 2 diabetes mellitus with diabetic chronic kidney disease: Secondary | ICD-10-CM

## 2016-03-03 DIAGNOSIS — Z79899 Other long term (current) drug therapy: Secondary | ICD-10-CM

## 2016-03-03 DIAGNOSIS — I130 Hypertensive heart and chronic kidney disease with heart failure and stage 1 through stage 4 chronic kidney disease, or unspecified chronic kidney disease: Secondary | ICD-10-CM

## 2016-03-03 DIAGNOSIS — I5042 Chronic combined systolic (congestive) and diastolic (congestive) heart failure: Secondary | ICD-10-CM

## 2016-03-03 DIAGNOSIS — N189 Chronic kidney disease, unspecified: Secondary | ICD-10-CM

## 2016-03-03 DIAGNOSIS — M62838 Other muscle spasm: Secondary | ICD-10-CM | POA: Diagnosis not present

## 2016-03-03 DIAGNOSIS — N184 Chronic kidney disease, stage 4 (severe): Secondary | ICD-10-CM

## 2016-03-03 DIAGNOSIS — Z794 Long term (current) use of insulin: Secondary | ICD-10-CM

## 2016-03-03 LAB — GLUCOSE, CAPILLARY: Glucose-Capillary: 192 mg/dL — ABNORMAL HIGH (ref 65–99)

## 2016-03-03 MED ORDER — CYCLOBENZAPRINE HCL 10 MG PO TABS: 10.0000 mg | ORAL_TABLET | Freq: Three times a day (TID) | ORAL | Status: AC | PRN

## 2016-03-03 NOTE — Telephone Encounter (Signed)
Is this for the same reason he presented>> Hip pain and shoulder pain? Or for a different reason?

## 2016-03-03 NOTE — Assessment & Plan Note (Addendum)
A; Piriformis syndrome of right side likely exacerbated by large wallet  P: Provided stretching exercises Flexeril 10mg  TIDPRN Advised to use small wallet in front pocket

## 2016-03-03 NOTE — Assessment & Plan Note (Signed)
A: Chronic combined CHF  P: Appears grossly euvolemic, cautioned about salt intake Appreciate [facility] help, would be benefitial if we could get him a scale for weights Will continue his current medications.

## 2016-03-03 NOTE — Telephone Encounter (Signed)
Pt calls and would like to go to OP PT, [facility] [address] is good for him, states he forgot to ask you about it this am at his visit

## 2016-03-03 NOTE — Assessment & Plan Note (Signed)
A: Essential HTN not at goal  P: BP improved on manual recheck after sitting, however he is not at goal. We have worked to get him off clonidine and I am glad to see he is now off.  I am not going to add a medication today which he is happy about, I have asked him to really focus on his diet and reducing salt intake as the best way to get to the goal without adding more medications.  Is will work on this. Continue cartia, coreg, hydralazine, imdur, and lasix.

## 2016-03-03 NOTE — Progress Notes (Signed)
  Subjective:   Patient ID: Charles Hart male   DOB: 07/20/1956 60 y.o.   MRN: [name]  HPI: [name] is a 60 y.o. presenting for right hip pain. He reports right hip pain, located at , it has been going on for 2-3 weeks, he rates the pain as a 9/10, he reports that it does radiate down to his right knee.  He notes that certain positions make the pain worse, tylenol helps the pain some.  He has had no trauma to the area. Of note he has a very large overfilled wallet in his left back pocket, when I asked him about it he reports that he normally keeps it on the right back pocket but due to the pain he had to move it. He also reports 5/10 left shoulder pain.  Pain is located at the posterior aspect of the shoulder and neck, it does not radiate.  It is worsened by movement and sleeping on that side which he has had to due since his hip pain started.  Tylenol is not effective.  He has not found anything that is effective at reducing the pain.  For his HTN he has been taking his medications. He confims today that he is off clonidine.  He denies any HA.  CHF: Went to ED about 3 weeks ago, Dx with AoC CHF but did not follow up, when we called he reproted he felt better,  He is currently taking lasix 80mg  once daily.  His weight is back down about 7 lbs since that visit.  He denies SOB or orthopnea.   Review of Systems: Per HPI  Objective:  Physical Exam: Filed Vitals:   03/03/16 1024  BP: 180/81  Pulse: 77  Temp: 98.2 F (36.8 C)  TempSrc: Oral  Height: 6' (1.829 m)  Weight: 227 lb 14.4 oz (103.375 kg)  SpO2: 100%   Physical Exam  Constitutional: He is oriented to person, place, and time and well-developed, well-nourished, and in no distress.  HENT:  Head: Normocephalic and atraumatic.  Eyes: Conjunctivae are normal.  Cardiovascular: Normal rate, regular rhythm and normal heart sounds.   Pulmonary/Chest: Effort normal and breath sounds normal. He has no wheezes. He has  no rales.  Abdominal: Soft. Bowel sounds are normal. He exhibits no distension.  Musculoskeletal: He exhibits edema (1+ bilaterally).       Right shoulder: He exhibits normal range of motion and no tenderness.       Left shoulder: He exhibits decreased range of motion (mildly decreased ROM 2/2 pain, able to Abduct past 120 degress) and tenderness (left trapizeus). He exhibits normal strength.       Right hip: He exhibits tenderness (acute tenderness and ropiness of left piriformus). He exhibits normal range of motion and normal strength.  Neurological: He is alert and oriented to person, place, and time.  Nursing note and vitals reviewed.   Assessment & Plan:  See encounters tab for problem based Assessment and plan

## 2016-03-03 NOTE — Telephone Encounter (Signed)
Requesting to speak with a nurse regarding personal problem.

## 2016-03-03 NOTE — Assessment & Plan Note (Signed)
A: DM type 2 well controlled by A1c standards but likely this is overestimating his control given his CKD  P: For now will continue his current dose of insulin, will need more data from his meter at next visit.

## 2016-03-03 NOTE — Assessment & Plan Note (Signed)
A: Muscle spasm of left shoulder likely exacerbated by sleeping on left side.  P:  Stretching Flexeril 10mg  TIDPRN

## 2016-03-03 NOTE — Patient Instructions (Addendum)
Piriformis Syndrome With Rehab Piriformis syndrome is a condition the affects the nervous system in the area of the hip, and is characterized by pain and possibly a loss of feeling in the backside (posterior) thigh that may extend down the entire length of the leg. The symptoms are caused by an increase in pressure on the sciatic nerve by the piriformis muscle, which is on the back of the hip and is responsible for externally rotating the hip. The sciatic nerve and its branches connect to much of the leg. Normally the sciatic nerve runs between the piriformis muscle and other muscles. However, in certain individuals the nerve runs through the muscle, which causes an increase in pressure on the nerve and results in the symptoms of piriformis syndrome. SYMPTOMS   Pain, tingling, numbness, or burning in the back of the thigh that may also extend down the entire leg.  Occasionally, tenderness in the buttock.  Loss of function of the leg.  Pain that worsens when using the piriformis muscle (running, jumping, or stairs).  Pain that increases with prolonged sitting.  Pain that is lessened by lying flat on the back. CAUSES   Piriformis syndrome is the result of an increase in pressure placed on the sciatic nerve. Oftentimes, piriformis syndrome is an overuse injury.  Stress placed on the nerve from a sudden increase in the intensity, frequency, or duration of training.  Compensation of other extremity injuries. RISK INCREASES WITH:  Sports that involve the piriformis muscle (running, walking, or jumping).  You are born with (congenital) a defect in which the sciatic nerve passes through the muscle. PREVENTION  Warm up and stretch properly before activity.  Allow for adequate recovery between workouts.  Maintain physical fitness:  Strength, flexibility, and endurance.  Cardiovascular fitness. PROGNOSIS  If treated properly, the symptoms of piriformis syndrome usually resolve in 2 to 6  weeks. RELATED COMPLICATIONS   Persistent and possibly permanent pain and numbness in the lower extremity.  Weakness of the extremity that may progress to disability and inability to compete. TREATMENT  The most effective treatment for piriformis syndrome is rest from any activities that aggravate the symptoms. Ice and pain medication may help reduce pain and inflammation. The use of strengthening and stretching exercises may help reduce pain with activity. These exercises may be performed at home or with a therapist. A referral to a therapist may be given for further evaluation and treatment, such as ultrasound. Corticosteroid injections may be given to reduce inflammation that is causing pressure to be placed on the sciatic nerve. If nonsurgical (conservative) treatment is unsuccessful, then surgery may be recommended.  MEDICATION   If pain medication is necessary, then nonsteroidal anti-inflammatory medications, such as aspirin and ibuprofen, or other minor pain relievers, such as acetaminophen, are often recommended.  Do not take pain medication for 7 days before surgery.  Prescription pain relievers may be given if deemed necessary by your caregiver. Use only as directed and only as much as you need.  Corticosteroid injections may be given by your caregiver. These injections should be reserved for the most serious cases, because they may only be given a certain number of times. HEAT AND COLD:   Cold treatment (icing) relieves pain and reduces inflammation. Cold treatment should be applied for 10 to 15 minutes every 2 to 3 hours for inflammation and pain and immediately after any activity that aggravates your symptoms. Use ice packs or massage the area with a piece of ice (ice massage).  Heat  treatment may be used prior to performing the stretching and strengthening activities prescribed by your caregiver, physical therapist, or athletic trainer. Use a heat pack or soak the injury in warm  water. SEEK IMMEDIATE MEDICAL CARE IF:  Treatment seems to offer no benefit, or the condition worsens.  Any medications produce adverse side effects. EXERCISES RANGE OF MOTION (ROM) AND STRETCHING EXERCISES - Piriformis Syndrome These exercises may help you when beginning to rehabilitate your injury. Your symptoms may resolve with or without further involvement from your physician, physical therapist, or athletic trainer. While completing these exercises, remember:   Restoring tissue flexibility helps normal motion to return to the joints. This allows healthier, less painful movement and activity.  An effective stretch should be held for at least 30 seconds.  A stretch should never be painful. You should only feel a gentle lengthening or release in the stretched tissue. STRETCH - Hip Rotators  Lie on your back on a firm surface. Grasp your right / left knee with your right / left hand and your ankle with your opposite hand.  Keeping your hips and shoulders firmly planted, gently pull your right / left knee and rotate your lower leg toward your opposite shoulder until you feel a stretch in your buttocks.  Hold this stretch for __________ seconds. Repeat this stretch __________ times. Complete this stretch __________ times per day. STRETCH - Iliotibial Band  On the floor or bed, lie on your side so your right / left leg is on top. Bend your knee and grab your ankle.  Slowly bring your knee back so that your thigh is in line with your trunk. Keep your heel at your buttocks and gently arch your back so your head, shoulders, and hips line up.  Slowly lower your leg so that your knee approaches the floor/bed until you feel a gentle stretch on the outside of your right / left thigh. If you do not feel a stretch and your knee will not fall farther, place the heel of your opposite foot on top of your knee and pull your thigh down farther.  Hold this stretch for __________ seconds. Repeat  __________ times. Complete __________ times per day. STRENGTHENING EXERCISES - Piriformis Syndrome  These are some of the caregiver again or until your symptoms are resolved. Remember:   Strong muscles with good endurance tolerate stress better.  Do the exercises as initially prescribed by your caregiver. Progress slowly with each exercise, gradually increasing the number of repetitions and weight used under their guidance. STRENGTH - Hip Abductors, Straight Leg Raises Be aware of your form throughout the entire exercise so that you exercise the correct muscles. Sloppy form means that you are not strengthening the correct muscles.  Lie on your side so that your head, shoulders, knee, and hip line up. You may bend your lower knee to help maintain your balance. Your right / left leg should be on top.  Roll your hips slightly forward, so that your hips are stacked directly over each other and your right / left knee is facing forward.  Lift your top leg up 4-6 inches, leading with your heel. Be sure that your foot does not drift forward or that your knee does not roll toward the ceiling.  Hold this position for __________ seconds. You should feel the muscles in your outer hip lifting (you may not notice this until your leg begins to tire).  Slowly lower your leg to the starting position. Allow the muscles to fully  relax before beginning the next repetition. Repeat __________ times. Complete this exercise __________ times per day.  STRENGTH - Hip Abductors, Quadruped  On a firm, lightly padded surface, position yourself on your hands and knees. Your hands should be directly below your shoulders and your knees should be directly below your hips.  Keeping your right / left knee bent, lift your leg out to the side. Keep your legs level and in line with your shoulders.  Position yourself on your hands and knees.  Hold for __________ seconds.  Keeping your trunk steady and your hips level, slowly  lower your leg to the starting position. Repeat __________ times. Complete this exercise __________ times per day.  STRENGTH - Hip Abductors, Standing  Tie one end of a rubber exercise band/tubing to a secure surface (table, pole) and tie a loop at the other end.  Place the loop around your right / left ankle. Keeping your ankle with the band directly opposite of the secured end, step away until there is tension in the tube/band.  Hold onto a chair as needed for balance.  Keeping your back upright, your shoulders over your hips, and your toes pointing forward, lift your right / left leg out to your side. Be sure to lift your leg with your hip muscles. Do not "throw" your leg or tip your body to lift your leg.  Slowly and with control, return to the starting position. Repeat exercise __________ times. Complete this exercise __________ times per day.    This information is not intended to replace advice given to you by your health care provider. Make sure you discuss any questions you have with your health care provider.   Document Released: 08/01/2005 Document Revised: 12/16/2014 Document Reviewed: 11/13/2008 Elsevier Interactive Patient Education 2016 Elsevier Inc.   Low-Sodium Eating Plan Sodium raises blood pressure and causes water to be held in the body. Getting less sodium from food will help lower your blood pressure, reduce any swelling, and protect your heart, liver, and kidneys. We get sodium by adding salt (sodium chloride) to food. Most of our sodium comes from canned, boxed, and frozen foods. Restaurant foods, fast foods, and pizza are also very high in sodium. Even if you take medicine to lower your blood pressure or to reduce fluid in your body, getting less sodium from your food is important. WHAT IS MY PLAN? Most people should limit their sodium intake to 2,300 mg a day. Your health care provider recommends that you limit your sodium intake to __________ a day.  WHAT DO I  NEED TO KNOW ABOUT THIS EATING PLAN? For the low-sodium eating plan, you will follow these general guidelines:  Choose foods with a % Daily Value for sodium of less than 5% (as listed on the food label).   Use salt-free seasonings or herbs instead of table salt or sea salt.   Check with your health care provider or pharmacist before using salt substitutes.   Eat fresh foods.  Eat more vegetables and fruits.  Limit canned vegetables. If you do use them, rinse them well to decrease the sodium.   Limit cheese to 1 oz (28 g) per day.   Eat lower-sodium products, often labeled as "lower sodium" or "no salt added."  Avoid foods that contain monosodium glutamate (MSG). MSG is sometimes added to [place] food and some canned foods.  Check food labels (Nutrition Facts labels) on foods to learn how much sodium is in one serving.  Eat more home-cooked food  and less restaurant, buffet, and fast food.  When eating at a restaurant, ask that your food be prepared with less salt, or no salt if possible.  HOW DO I READ FOOD LABELS FOR SODIUM INFORMATION? The Nutrition Facts label lists the amount of sodium in one serving of the food. If you eat more than one serving, you must multiply the listed amount of sodium by the number of servings. Food labels may also identify foods as:  Sodium free--Less than 5 mg in a serving.  Very low sodium--35 mg or less in a serving.  Low sodium--140 mg or less in a serving.  Light in sodium--50% less sodium in a serving. For example, if a food that usually has 300 mg of sodium is changed to become light in sodium, it will have 150 mg of sodium.  Reduced sodium--25% less sodium in a serving. For example, if a food that usually has 400 mg of sodium is changed to reduced sodium, it will have 300 mg of sodium. WHAT FOODS CAN I EAT? Grains Low-sodium cereals, including oats, puffed wheat and rice, and shredded wheat cereals. Low-sodium crackers. Unsalted  rice and pasta. Lower-sodium bread.  Vegetables Frozen or fresh vegetables. Low-sodium or reduced-sodium canned vegetables. Low-sodium or reduced-sodium tomato sauce and paste. Low-sodium or reduced-sodium tomato and vegetable juices.  Fruits Fresh, frozen, and canned fruit. Fruit juice.  Meat and Other Protein Products Low-sodium canned tuna and salmon. Fresh or frozen meat, poultry, seafood, and fish. Lamb. Unsalted nuts. Dried beans, peas, and lentils without added salt. Unsalted canned beans. Homemade soups without salt. Eggs.  Dairy Milk. Soy milk. Ricotta cheese. Low-sodium or reduced-sodium cheeses. Yogurt.  Condiments Fresh and dried herbs and spices. Salt-free seasonings. Onion and garlic powders. Low-sodium varieties of mustard and ketchup. Fresh or refrigerated horseradish. Lemon juice.  Fats and Oils Reduced-sodium salad dressings. Unsalted butter.  Other Unsalted popcorn and pretzels.  The items listed above may not be a complete list of recommended foods or beverages. Contact your dietitian for more options. WHAT FOODS ARE NOT RECOMMENDED? Grains Instant hot cereals. Bread stuffing, pancake, and biscuit mixes. Croutons. Seasoned rice or pasta mixes. Noodle soup cups. Boxed or frozen macaroni and cheese. Self-rising flour. Regular salted crackers. Vegetables Regular canned vegetables. Regular canned tomato sauce and paste. Regular tomato and vegetable juices. Frozen vegetables in sauces. Salted [place] fries. Olives. Angie Fava. Relishes. Sauerkraut. Salsa. Meat and Other Protein Products Salted, canned, smoked, spiced, or pickled meats, seafood, or fish. Bacon, ham, sausage, hot dogs, corned beef, chipped beef, and packaged luncheon meats. Salt pork. Jerky. Pickled herring. Anchovies, regular canned tuna, and sardines. Salted nuts. Dairy Processed cheese and cheese spreads. Cheese curds. Blue cheese and cottage cheese. Buttermilk.  Condiments Onion and garlic salt,  seasoned salt, table salt, and sea salt. Canned and packaged gravies. Worcestershire sauce. Tartar sauce. Barbecue sauce. Teriyaki sauce. Soy sauce, including reduced sodium. Steak sauce. Fish sauce. Oyster sauce. Cocktail sauce. Horseradish that you find on the shelf. Regular ketchup and mustard. Meat flavorings and tenderizers. Bouillon cubes. Hot sauce. Tabasco sauce. Marinades. Taco seasonings. Relishes. Fats and Oils Regular salad dressings. Salted butter. Margarine. Ghee. Bacon fat.  Other Potato and tortilla chips. Corn chips and puffs. Salted popcorn and pretzels. Canned or dried soups. Pizza. Frozen entrees and pot pies.  The items listed above may not be a complete list of foods and beverages to avoid. Contact your dietitian for more information.   This information is not intended to replace advice given to  you by your health care provider. Make sure you discuss any questions you have with your health care provider.   Document Released: 01/21/2002 Document Revised: 08/22/2014 Document Reviewed: 06/05/2013 Elsevier Interactive Patient Education [employer].

## 2016-03-04 NOTE — Telephone Encounter (Signed)
For the same

## 2016-03-08 NOTE — Telephone Encounter (Signed)
I gave [name] a call, he reports the medication and stretches I gave him at the visit have worked and he is doing much better,  We will HOLD OFF on PT referral for now, he will let me know if this changes.

## 2016-03-10 ENCOUNTER — Encounter: Payer: PPO | Admitting: Internal Medicine

## 2016-03-10 DIAGNOSIS — E113592 Type 2 diabetes mellitus with proliferative diabetic retinopathy without macular edema, left eye: Secondary | ICD-10-CM | POA: Diagnosis not present

## 2016-03-11 ENCOUNTER — Other Ambulatory Visit: Payer: Self-pay

## 2016-03-12 NOTE — Patient Outreach (Signed)
[facility]) Care Management   03/12/2016  ALADINO COURY [birth year] [id number]  CARDEN BYCE is an 60 y.o. male  Subjective:  I don't have a  Scale I have not had any diabetic or heart failure education. .  Objective:   ROS  Physical Exam  Encounter Medications:   Outpatient Encounter Prescriptions as of 03/11/2016  Medication Sig Note  . acetaminophen (TYLENOL) 500 MG tablet Take 1,000 mg by mouth every 6 (six) hours as needed (pain).    . BD PEN NEEDLE NANO U/F 32G X 4 MM MISC USE TO INJECT LANTUS ONCE A DAY AND NOVOLOG THREE TIMES DAILY   . CARTIA XT 180 MG 24 hr capsule TAKE 1 CAPSULE(180 MG) BY MOUTH DAILY   . carvedilol (COREG) 12.5 MG tablet Take 1 tablet (12.5 mg total) by mouth 2 (two) times daily.   . clopidogrel (PLAVIX) 75 MG tablet Take 1 tablet (75 mg total) by mouth daily.   . cyclobenzaprine (FLEXERIL) 10 MG tablet Take 1 tablet (10 mg total) by mouth 3 (three) times daily as needed for muscle spasms.   . furosemide (LASIX) 80 MG tablet Take 1 tablet (80 mg total) by mouth 2 (two) times daily. (Patient taking differently: Take 80 mg by mouth every morning. And may take an additional 40 mg later in the day as a second dose for marked swelling/edema) 03/03/2016: Patient only taking 80mg  once a day  . gabapentin (NEURONTIN) 300 MG capsule TAKE 1 CAPSULE(300 MG) BY MOUTH EVERY MORNING (Patient taking differently: Take 300 mg by mouth at bedtime)   . hydrALAZINE (APRESOLINE) 100 MG tablet Take 1 tablet (100 mg total) by mouth 3 (three) times daily.   . insulin aspart (NOVOLOG FLEXPEN) 100 UNIT/ML FlexPen Inject 7 Units into the skin 3 (three) times daily with meals. (Patient taking differently: Inject 7 Units into the skin 3 (three) times daily with meals. Or sliding scale is used when patient has access to glucometer: If BGL is 150 or greater, 1 unit is given for every 50 points (above a reading of 150)) 02/17/2016: Dosage verified by patient and wife Or  sliding scale is used when patient has access to glucometer: If BGL is 150 or greater, 1 unit is given for every 50 points (above a reading of 150)  . Insulin Glargine (LANTUS SOLOSTAR) 100 UNIT/ML Solostar Pen Inject 30 Units into the skin daily at 10 pm. (Patient taking differently: Inject 30 Units into the skin at bedtime. ) 02/17/2016: Dosage verified by patient and wife  . isosorbide mononitrate (IMDUR) 60 MG 24 hr tablet TAKE 1 TABLET(60 MG) BY MOUTH DAILY   . Multiple Vitamins-Minerals (ONE-A-DAY MENS 50+ ADVANTAGE PO) Take 1 tablet by mouth daily with breakfast.   . ONE TOUCH ULTRA TEST test strip USE TO TEST BLOOD SUGAR THREE TIMES DAILY   . ONETOUCH DELICA LANCETS 99991111 MISC USE TO TEST BLOOD SUGAR THREE TIMES DAILY   . pravastatin (PRAVACHOL) 40 MG tablet Take 1 tablet (40 mg total) by mouth daily.    No facility-administered encounter medications on file as of 03/11/2016.     Functional Status:   In your present state of health, do you have any difficulty performing the following activities: 03/11/2016 03/03/2016  Hearing? Tempie Donning  Vision? Y Y  Difficulty concentrating or making decisions? N N  Walking or climbing stairs? Y -  Dressing or bathing? N N  Doing errands, shopping? N N  Preparing Food and eating ? N -  Using the Toilet? N -  In the past six months, have you accidently leaked urine? N -  Do you have problems with loss of bowel control? N -  Managing your Medications? N -  Managing your Finances? N -  Housekeeping or managing your Housekeeping? N -  Some recent data might be hidden    Fall/Depression Screening:    PHQ 2/9 Scores 03/11/2016 03/03/2016 02/26/2016 02/26/2016 02/15/2016 02/08/2016 01/07/2016  PHQ - 2 Score 0 0 0 0 0 0 0    Assessment:   Patient had knowledge deficit related to diabetes and heart failure.    Patient lives independently in a home a 2 story home with his wife and 3 foster children ranging from ages 45 months to 69 years.   Wife works full time  outside the home. Patient reports having 3 grown children living outside the home.    Patient reports management of his medicatios as prescribed and being independent with ADLs and IADLs.   Patient is able to drive and has relable transportation.  Patient has glucometer but reports not being consistent in measuring his blood glucose daily. Patient reports compliance with attendance to medical appointments.   Patient reports never having diabetic or heart failure   Plan:  Home visit in August for heart failure and diabetes education. Refer patient for free scale offered through [facility] heart failure program.

## 2016-03-21 ENCOUNTER — Other Ambulatory Visit: Payer: Self-pay | Admitting: Licensed Clinical Social Worker

## 2016-03-21 ENCOUNTER — Encounter: Payer: Self-pay | Admitting: Licensed Clinical Social Worker

## 2016-03-21 NOTE — Patient Outreach (Signed)
[facility]) Care Management  03/21/2016  Charles Hart 03-Sep-1955 [id number]   Assessment- CSW completed outreach to patient successfully. Patient reports that "Not much has changed with my finances." Patient reports that he is aware of the financial resources that CSW provided and reviewed with him and can read back these resources but has not made any progress with implement any of the resources. CSW encouraged him to gain credit counseling services with [employer] Credit [employer]ing program. He reports that he will consider this. CSW reviewed all financial resources again with patient. Patient has not used any food pantries. Patient reports that he rather focus on gaining a new job than using these resources. CSW provided vocational rehabilitation resources to patient. Patient reports that all the numbers he had for vocational rehabilitation were not active working numbers. CSW provided the correct contact information, address and numbers. Patient has not finished his budget work sheets that were provided by CSW but he reports that he knows how to use it when he is ready. Patient appreciative of this information. Patient denies any further social work needs at this time and reports that he will use financial resources when he is ready. CSW will complete social work discharge at this time. [facility] RNCM is still involved with case.  Plan-CSW will update RNCM and PCP.  [facility] CM Care Plan Problem One   Flowsheet Row Most Recent Value  [facility] CM Short Term Goal #1 (0-30 days)  Patient willl be able to identify three financial resources that he can use within one week   [facility] CM Short Term Goal #1 Start Date  02/22/16  [facility] CM Short Term Goal #1 Met Date  02/26/16  Interventions for Short Term Goal #1  Patient was able to successfully read back financial resource information of three agencies that had been previously provided to him during initial phone call.  [facility] CM Short Term Goal #2 (0-30  days)  In 30 days, patient will complete budget form provided to him by CSW as evidenced by his desire to learn how to budget due to financial concerns with medical expenses.  [facility] CM Short Term Goal #2 Start Date  02/26/16  [facility] CM Short Term Goal #2 Met Date  03/21/16  Interventions for Short Term Goal #2  Goal met.  [facility] CM Short Term Goal #3 (0-30 days)  In 30 days patient will discuss financial resources as evidenced by his financial concerns with medical bills and spouse was not present during home visit  [facility] CM Short Term Goal #3 Start Date  02/26/16  [facility] CM Short Term Goal #3 Met Date  03/21/16  Interventions for Short Tern Goal #3  Goal met      Eula Fried, [place], MSW, [email or web address] Phone: [phone] Fax: [phone]

## 2016-03-23 DIAGNOSIS — Z961 Presence of intraocular lens: Secondary | ICD-10-CM | POA: Diagnosis not present

## 2016-03-24 NOTE — Telephone Encounter (Signed)
reviewed

## 2016-03-31 ENCOUNTER — Ambulatory Visit: Payer: Self-pay

## 2016-04-19 ENCOUNTER — Other Ambulatory Visit: Payer: Self-pay | Admitting: *Deleted

## 2016-04-19 MED ORDER — GABAPENTIN 300 MG PO CAPS: ORAL_CAPSULE | ORAL | 0 refills | Status: DC

## 2016-04-19 MED ORDER — CARTIA XT 180 MG PO CP24: ORAL_CAPSULE | ORAL | 0 refills | Status: DC

## 2016-05-17 DIAGNOSIS — E113591 Type 2 diabetes mellitus with proliferative diabetic retinopathy without macular edema, right eye: Secondary | ICD-10-CM | POA: Diagnosis not present

## 2016-05-17 DIAGNOSIS — E113592 Type 2 diabetes mellitus with proliferative diabetic retinopathy without macular edema, left eye: Secondary | ICD-10-CM | POA: Diagnosis not present

## 2016-05-17 DIAGNOSIS — H4312 Vitreous hemorrhage, left eye: Secondary | ICD-10-CM | POA: Diagnosis not present

## 2016-05-17 DIAGNOSIS — H43812 Vitreous degeneration, left eye: Secondary | ICD-10-CM | POA: Diagnosis not present

## 2016-05-17 LAB — HM DIABETES EYE EXAM

## 2016-05-25 ENCOUNTER — Telehealth: Payer: Self-pay | Admitting: Internal Medicine

## 2016-05-25 NOTE — Telephone Encounter (Signed)
Pt does not want to talk to the nurse he wants dr Heber [place] to call him direct, please call him at [phone]

## 2016-05-25 NOTE — Telephone Encounter (Signed)
Needs to talk to nurse about personal issue would not state what

## 2016-05-25 NOTE — Telephone Encounter (Signed)
I called back Charles Hart, he is concerned about ED, he was unable to maintain an erection last night and it is causing marital stress.  I discussed with him again that we have to avoid medications like cialis and viagra as he is on a long acting nitrate.  I recommended also that he follow up with his cardiologist because he is overdue and make an appointment with me and we can discuss this is more detail at that visit.

## 2016-05-31 DIAGNOSIS — I5040 Unspecified combined systolic (congestive) and diastolic (congestive) heart failure: Secondary | ICD-10-CM | POA: Diagnosis not present

## 2016-05-31 DIAGNOSIS — D649 Anemia, unspecified: Secondary | ICD-10-CM | POA: Diagnosis not present

## 2016-05-31 DIAGNOSIS — N2581 Secondary hyperparathyroidism of renal origin: Secondary | ICD-10-CM | POA: Diagnosis not present

## 2016-05-31 DIAGNOSIS — E1322 Other specified diabetes mellitus with diabetic chronic kidney disease: Secondary | ICD-10-CM | POA: Diagnosis not present

## 2016-05-31 DIAGNOSIS — E1129 Type 2 diabetes mellitus with other diabetic kidney complication: Secondary | ICD-10-CM | POA: Diagnosis not present

## 2016-05-31 DIAGNOSIS — E785 Hyperlipidemia, unspecified: Secondary | ICD-10-CM | POA: Diagnosis not present

## 2016-05-31 DIAGNOSIS — E669 Obesity, unspecified: Secondary | ICD-10-CM | POA: Diagnosis not present

## 2016-05-31 DIAGNOSIS — N184 Chronic kidney disease, stage 4 (severe): Secondary | ICD-10-CM | POA: Diagnosis not present

## 2016-05-31 DIAGNOSIS — I129 Hypertensive chronic kidney disease with stage 1 through stage 4 chronic kidney disease, or unspecified chronic kidney disease: Secondary | ICD-10-CM | POA: Diagnosis not present

## 2016-06-07 ENCOUNTER — Other Ambulatory Visit: Payer: Self-pay | Admitting: Vascular Surgery

## 2016-06-07 DIAGNOSIS — Z01810 Encounter for preprocedural cardiovascular examination: Secondary | ICD-10-CM

## 2016-06-09 ENCOUNTER — Encounter: Payer: Self-pay | Admitting: Internal Medicine

## 2016-06-09 ENCOUNTER — Ambulatory Visit (INDEPENDENT_AMBULATORY_CARE_PROVIDER_SITE_OTHER): Payer: PPO | Admitting: Internal Medicine

## 2016-06-09 VITALS — BP 148/68 | HR 74 | Temp 98.2°F | Ht 72.0 in | Wt 223.6 lb

## 2016-06-09 DIAGNOSIS — Z794 Long term (current) use of insulin: Secondary | ICD-10-CM | POA: Diagnosis not present

## 2016-06-09 DIAGNOSIS — I10 Essential (primary) hypertension: Secondary | ICD-10-CM

## 2016-06-09 DIAGNOSIS — I130 Hypertensive heart and chronic kidney disease with heart failure and stage 1 through stage 4 chronic kidney disease, or unspecified chronic kidney disease: Secondary | ICD-10-CM | POA: Diagnosis not present

## 2016-06-09 DIAGNOSIS — I5042 Chronic combined systolic (congestive) and diastolic (congestive) heart failure: Secondary | ICD-10-CM | POA: Diagnosis not present

## 2016-06-09 DIAGNOSIS — G4733 Obstructive sleep apnea (adult) (pediatric): Secondary | ICD-10-CM

## 2016-06-09 DIAGNOSIS — N184 Chronic kidney disease, stage 4 (severe): Secondary | ICD-10-CM | POA: Diagnosis not present

## 2016-06-09 DIAGNOSIS — E1122 Type 2 diabetes mellitus with diabetic chronic kidney disease: Secondary | ICD-10-CM | POA: Diagnosis not present

## 2016-06-09 DIAGNOSIS — G5701 Lesion of sciatic nerve, right lower limb: Secondary | ICD-10-CM

## 2016-06-09 DIAGNOSIS — N529 Male erectile dysfunction, unspecified: Secondary | ICD-10-CM

## 2016-06-09 DIAGNOSIS — Z79899 Other long term (current) drug therapy: Secondary | ICD-10-CM

## 2016-06-09 LAB — GLUCOSE, CAPILLARY: Glucose-Capillary: 164 mg/dL — ABNORMAL HIGH (ref 65–99)

## 2016-06-09 LAB — POCT GLYCOSYLATED HEMOGLOBIN (HGB A1C): HEMOGLOBIN A1C: 5.4

## 2016-06-09 NOTE — Progress Notes (Signed)
[facility] Subjective:  HPI: [name] is a 60 y.o. male who presents for Follow-up of his diabetes hypertension CK D.  Please see problem discharging below for the status of his chronic medical problems.     Review of Systems: Review of Systems  Constitutional: Negative for fever and weight loss.  Respiratory: Negative for cough.   Cardiovascular: Negative for chest pain.  Gastrointestinal: Negative for abdominal pain.  Genitourinary: Negative for frequency.  Musculoskeletal: Negative for falls.  Neurological: Negative for headaches.  Endo/Heme/Allergies: Negative for polydipsia.    Objective:  Physical Exam: Vitals:   06/09/16 0903 06/09/16 0959  BP: (!) 172/81 (!) 148/68  Pulse: 74   Temp: 98.2 F (36.8 C)   TempSrc: Oral   SpO2: 100%   Weight: 223 lb 9.6 oz (101.4 kg)   Height: 6' (1.829 m)    Physical Exam  Constitutional: He is well-developed, well-nourished, and in no distress.  Cardiovascular: Normal rate and regular rhythm.   Pulmonary/Chest: Effort normal and breath sounds normal.  Abdominal: Soft. Bowel sounds are normal. There is no tenderness.  Musculoskeletal: He exhibits no edema.  Nursing note and vitals reviewed.  Assessment & Plan:  Essential hypertension History of present illness: He reports compliance with his blood pressure medications with the exception of Imdur which he ran out of 3 days ago he reports this is due to cost. He notes that the cost of Imdur is $4 however he simply does not have the cash for this but hopes to in a few days. He denies any headache visual changes or chest pain.  Assessment: Essential hypertension mildly elevated  Plan Continue Cardura 180 mg daily Continue Coreg 12.5 mg twice a day Continue hydralazine 100 mg 3 times a day Continue Imdur 60 mg daily   Chronic combined systolic and diastolic congestive heart failure (HCC) History of present illness: He denies orthopnea he  does have some shortness of breath with walking long distances. He has not had any swelling of his ankles lately. He has been taking his Lasix 80 mg every morning however he has been taking a 40 mg dose in the p.m. on days which he thinks he needs. He has received a scale from [facility] and has been monitoring his weights.  Assessment: Chronic combined congestive heart failure  Plan Continue current medications Continue weight monitoring Patient has follow-up with cardiology in January.  Obstructive sleep apnea History of present illness: He still does not use CPAP with regularity. He reports this is due to the tubing and mask being dirty been very difficult to clean.  Assessment: Severe OSA  Plan: I encouraged him to use his CPAP as directed and also to call [facility] about his CPAP supplies.  DM (diabetes mellitus), type 2 with renal complications [facility]) Lab Results  Component Value Date   HGBA1C 5.4 06/09/2016   History of present illness: He reports no issues with his diabetes blood sugars been well controlled no hypoglycemia. He's been using between 25 and 30 units of Lantus as well as sliding scale of NovoLog with a correction factor of 50.  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 He has stopped using a large follow-up and reports great improvement in his pain.  ED (erectile dysfunction) History of present illness: He is  very concerned about erectile dysfunction. He reports this is causing stress on his marriage. He notes that he does have sexual desire he is able to have nocturnal and morning erections and obtain erection before sex however he is having trouble maintaining an erection for successful intercourse. He has been on Imdur for a long period of time he denies any chest pain and notes he has not  used nitroglycerin tablets for the last few years.  Assessment: Erectile dysfunction  Plan I discussed with him again that we cannot use the first line of therapy oral medications due to his use of nitrates and coronary artery disease. Given his blood pressure is not well controlled on the very inclined to discontinue the Imdur at this time. I did offer a referral to urology to discuss other therapies however he reports that he has an appointment with his cardiologist he will bring up the issue and see if his cardiologist feels that the nitrates can be safely discontinued.   Medications Ordered No orders of the defined types were placed in this encounter.  Other Orders Orders Placed This Encounter  Procedures  . Glucose, capillary  . POCT HgB A1C (CPT [phone])   Follow Up: Return in about 3 months (around 09/09/2016).

## 2016-06-09 NOTE — Patient Instructions (Signed)
Watch your salt intake as we talked about.  I think it will be great to start exercising.

## 2016-06-10 NOTE — Assessment & Plan Note (Signed)
History of present illness: He is very concerned about erectile dysfunction. He reports this is causing stress on his marriage. He notes that he does have sexual desire he is able to have nocturnal and morning erections and obtain erection before sex however he is having trouble maintaining an erection for successful intercourse. He has been on Imdur for a long period of time he denies any chest pain and notes he has not used nitroglycerin tablets for the last few years.  Assessment: Erectile dysfunction  Plan I discussed with him again that we cannot use the first line of therapy oral medications due to his use of nitrates and coronary artery disease. Given his blood pressure is not well controlled on the very inclined to discontinue the Imdur at this time. I did offer a referral to urology to discuss other therapies however he reports that he has an appointment with his cardiologist he will bring up the issue and see if his cardiologist feels that the nitrates can be safely discontinued.

## 2016-06-10 NOTE — Assessment & Plan Note (Signed)
He did very well with the exercises I gave him at his last visit. He has stopped using a large follow-up and reports great improvement in his pain.

## 2016-06-10 NOTE — Assessment & Plan Note (Signed)
Lab Results  Component Value Date   HGBA1C 5.4 06/09/2016   History of present illness: He reports no issues with his diabetes blood sugars been well controlled no hypoglycemia. He's been using between 25 and 30 units of Lantus as well as sliding scale of NovoLog with a correction factor of 50.  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

## 2016-06-10 NOTE — Assessment & Plan Note (Signed)
History of present illness: He still does not use CPAP with regularity. He reports this is due to the tubing and mask being dirty been very difficult to clean.  Assessment: Severe OSA  Plan: I encouraged him to use his CPAP as directed and also to call [facility] about his CPAP supplies.

## 2016-06-10 NOTE — Assessment & Plan Note (Addendum)
History of present illness: He reports compliance with his blood pressure medications with the exception of Imdur which he ran out of 3 days ago he reports this is due to cost. He notes that the cost of Imdur is $4 however he simply does not have the cash for this but hopes to in a few days. He denies any headache visual changes or chest pain.  Assessment: Essential hypertension mildly elevated  Plan Continue Cardura 180 mg daily Continue Coreg 12.5 mg twice a day Continue hydralazine 100 mg 3 times a day Continue Imdur 60 mg daily

## 2016-06-10 NOTE — Assessment & Plan Note (Signed)
History of present illness: He denies orthopnea he does have some shortness of breath with walking long distances. He has not had any swelling of his ankles lately. He has been taking his Lasix 80 mg every morning however he has been taking a 40 mg dose in the p.m. on days which he thinks he needs. He has received a scale from [facility] and has been monitoring his weights.  Assessment: Chronic combined congestive heart failure  Plan Continue current medications Continue weight monitoring Patient has follow-up with cardiology in January.

## 2016-06-13 ENCOUNTER — Encounter: Payer: Self-pay | Admitting: *Deleted

## 2016-06-14 ENCOUNTER — Encounter: Payer: Self-pay | Admitting: Internal Medicine

## 2016-06-29 ENCOUNTER — Encounter: Payer: Self-pay | Admitting: Vascular Surgery

## 2016-07-12 ENCOUNTER — Encounter (HOSPITAL_COMMUNITY): Payer: PPO

## 2016-07-12 ENCOUNTER — Encounter: Payer: PPO | Admitting: Vascular Surgery

## 2016-07-12 ENCOUNTER — Other Ambulatory Visit (HOSPITAL_COMMUNITY): Payer: PPO

## 2016-08-09 ENCOUNTER — Encounter: Payer: Self-pay | Admitting: *Deleted

## 2016-08-11 ENCOUNTER — Other Ambulatory Visit: Payer: Self-pay

## 2016-08-11 ENCOUNTER — Telehealth: Payer: Self-pay | Admitting: Cardiovascular Disease

## 2016-08-11 NOTE — Telephone Encounter (Signed)
New Message   Calling to get patients results from most recent echo.

## 2016-08-16 ENCOUNTER — Other Ambulatory Visit: Payer: Self-pay | Admitting: *Deleted

## 2016-08-16 MED ORDER — FUROSEMIDE 80 MG PO TABS: 80.0000 mg | ORAL_TABLET | Freq: Two times a day (BID) | ORAL | 2 refills | Status: DC

## 2016-08-16 MED ORDER — INSULIN GLARGINE 100 UNIT/ML SOLOSTAR PEN: 30.0000 [IU] | PEN_INJECTOR | Freq: Every day | SUBCUTANEOUS | 5 refills | Status: DC

## 2016-08-16 MED ORDER — GABAPENTIN 300 MG PO CAPS: ORAL_CAPSULE | ORAL | 0 refills | Status: DC

## 2016-08-16 MED ORDER — CARVEDILOL 12.5 MG PO TABS: 12.5000 mg | ORAL_TABLET | Freq: Two times a day (BID) | ORAL | 6 refills | Status: DC

## 2016-08-16 MED ORDER — CARTIA XT 180 MG PO CP24: ORAL_CAPSULE | ORAL | 0 refills | Status: DC

## 2016-08-22 ENCOUNTER — Other Ambulatory Visit: Payer: Self-pay | Admitting: *Deleted

## 2016-08-22 MED ORDER — ISOSORBIDE MONONITRATE ER 60 MG PO TB24: ORAL_TABLET | ORAL | 5 refills | Status: DC

## 2016-08-25 NOTE — Progress Notes (Signed)
Patient ID: MATTHEWS FRANKS, male   DOB: August 10, 1956, 61 y.o.   MRN: [id number] PCP:Dr Hoffman  Primary Cardiologist: Dr [name]  Nephrology: Dr Lorrene Reid [facility]   HPI:  Mr Mouhamad is a 61 y.o.   AA with poorly controlled HTN, CVA 2013 on plavix,  Hyperlipidemia, MI , heart caths 2004 and 2008 with normal cors, and DM complicated by CRF. History of CHF with depressed EF in the past, but ECHO  August 2014 showed recovery with EF 55-60% and Grade II DD but in April 2016 EF was back down to 25-30%.  He is not on ace due to allergy and CKD.    Admitted the end of April 2016 with HTN urgency and worsening renal function. He was on NTG drip short term. Creatinine 2.46 most recently but as high as 5 on admission.    Seen in ER 02/17/16:  Dyspnea BNP only 434 r/o CXR ok d/c home Seen by primary Hoffman and labs done October with Cr 4.1 BUN 49  Lots of questions about dialysis He is willing to entertain now Urine flow has decreased But no other uremic signs. 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 Wants to stop imdur So he can take viagra  He has no documented CAD   SH: Wife helps him at home. 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 Does not smoke or drink alcohol.   FH:- Sister and brother heart attacks.   ROS: All systems negative except as listed in HPI, PMH and Problem List.  Echo 05/06/15 Reviewed  Study Conclusions  - Procedure narrative: Transthoracic echocardiography for left ventricular function evaluation. Image quality was suboptimal. The study was technically difficult, as a result of poor acoustic windows, poor patient compliance, and body habitus. - Left ventricle: The cavity size was normal. Wall thickness was increased in a pattern of moderate LVH. Systolic function was mildly to moderately reduced. The estimated ejection fraction was in the range of 40% to 45%. There may be inferoapical hypokinesis. Doppler parameters are consistent with abnormal left ventricular  relaxation (grade 1 diastolic dysfunction). The E/e&' ratio is >15, suggesting elevated LV filling pressure. - Aorta: Aortic root dimension: 42 mm (ED). Ascending aortic diameter: 38 mm (S). - Aortic root: The aortic root is dilated. - Ascending aorta: The ascending aorta was normal in size. - Left atrium: The atrium was mildly dilated. - Right ventricle: The cavity size was normal. Wall thickness was normal. Systolic function was normal. - Right atrium: The atrium was mildly dilated.  Impressions:  - Compared to a prior echo in 11/2014, the EF has improved to 40-45%. There may be mild inferoapical hypokinesis, LV filling pressure is elevated. 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  SH:  Social History   Social History  . Marital status: Married    Spouse name: N/A  . Number of children: N/A  . Years of education: N/A   Occupational History  . Not on file.   Social History Main Topics  . Smoking status: Never Smoker  . Smokeless tobacco: Never Used  . Alcohol use No     Comment: Wine rarely.  . Drug use: No  . Sexual activity: Not on file   Other Topics Concern  . Not on file   Social History Narrative  . No narrative on file    FH:  Family History  Problem Relation Age of Onset  . Aneurysm Father 23    died of rupture  . Colon cancer Sister  Past Medical History:  Diagnosis Date  . Adenomatous colon polyp 07/02/2011   Last colonoscopy May 06, 2011 by Dr. Owens Loffler, who recommended repeat colonoscopy in 5 years.   . Background diabetic retinopathy 04/20/2012   Patient is followed by Dr. Katy Fitch   . Cardiomyopathy    LV function improved from 2004 to 2008.  Historically, moderately dilated LV with EF 30-40% by 2D echo 08/14/2002.  Mild CAD with severe LV dysfunction by cardiac cath 09/2002.  Normal coronary arteries and normal LV function by cardiac cath 09/19/2006.  A 2-D echo on 04/01/2009 showed mild concentric hypertrophy and normal systolic  (LVEF  70-01%) and doppler C/W with grade 1 diastolic dysfunction.  . CHF (congestive heart failure) ([place])    LV function improved from 2004 to 2008.  Historically, moderately dilated LV with EF 30-40% by 2D echo 08/14/2002.  Mild CAD with severe LV dysfunction by cardiac cath 09/2002.  Normal coronary arteries and normal LV function by cardiac cath 09/19/2006.  A 2-D echo on 04/01/2009 showed mild concentric hypertrophy and normal systolic (LVEF  74-94%) and doppler C/W with grade 1 diastolic dysfunction..   . Chronic combined systolic and diastolic congestive heart failure ([place]) 05/21/2010   LV function improved from 2004 to 2008.  Historically, moderately dilated LV with EF 30-40% by 2D echo 08/14/2002.  Mild CAD with severe LV dysfunction by cardiac cath 09/2002.  Normal coronary arteries and normal LV function by cardiac cath 09/19/2006.  A 2-D echo on 04/01/2009 showed mild concentric hypertrophy and normal systolic (LVEF  49-67%) and doppler parameters consistent with abnormal left   . CVA (cerebral vascular accident) ([place]) 07/04/2012   MRI of the brain 07/04/2012 showed an acute infarct in the right basal ganglia involving the anterior putamen, anterior limb internal capsule, and head of the caudate; this measured approximately 2.5 cm in diameter.     . Dermatitis   . Diabetes mellitus    type 2  . DIABETIC PERIPHERAL NEUROPATHY 08/03/2007   Qualifier: Diagnosis of  By: Marinda Elk MD, Sonia Side    . DM neuropathy, painful ([place])   . Hearing loss in right ear   . Hyperlipidemia   . Hypertension   . Hypertensive crisis 07/28/2012  . Hypertensive urgency 08/20/2014  . Myocardial infarction   . Nephrotic syndrome 02/18/2013   A 24-hour urine collection 03/04/2013 showed total protein of 5,460 g and creatinine clearance of 80 mL/minute.  Patient was seen by Seward Meth at [place] and a repeat 24-hour urine showed 10,407 mg protein.  Patient underwent kidney biopsy on 05/30/2013;  pathology showed advanced diffuse and nodular diabetic nephropathy with vascular changes consistent with long-standing difficult to control hypertension.       Current Outpatient Prescriptions  Medication Sig Dispense Refill  . acetaminophen (TYLENOL) 500 MG tablet Take 1,000 mg by mouth every 6 (six) hours as needed (pain).     . BD PEN NEEDLE NANO U/F 32G X 4 MM MISC USE TO INJECT LANTUS ONCE A DAY AND NOVOLOG THREE TIMES DAILY 200 each 2  . CARTIA XT 180 MG 24 hr capsule TAKE 1 CAPSULE(180 MG) BY MOUTH DAILY 90 capsule 0  . carvedilol (COREG) 12.5 MG tablet Take 1 tablet (12.5 mg total) by mouth 2 (two) times daily. 60 tablet 6  . clopidogrel (PLAVIX) 75 MG tablet Take 1 tablet (75 mg total) by mouth daily. 90 tablet 3  . furosemide (LASIX) 40 MG tablet Take 40 mg by mouth daily  as needed. If ot is holding fluid he takes a 40 with the 80 in the pm    . furosemide (LASIX) 80 MG tablet Take 1 tablet (80 mg total) by mouth 2 (two) times daily. 60 tablet 2  . gabapentin (NEURONTIN) 300 MG capsule Take 300 mg by mouth at bedtime 90 capsule 0  . hydrALAZINE (APRESOLINE) 100 MG tablet Take 1 tablet (100 mg total) by mouth 3 (three) times daily. 90 tablet 11  . insulin aspart (NOVOLOG FLEXPEN) 100 UNIT/ML FlexPen Inject 7 Units into the skin 3 (three) times daily with meals. (Patient taking differently: Inject 7 Units into the skin 3 (three) times daily with meals. Or sliding scale is used when patient has access to glucometer: If BGL is 150 or greater, 1 unit is given for every 50 points (above a reading of 150)) 15 mL 5  . Insulin Glargine (LANTUS SOLOSTAR) 100 UNIT/ML Solostar Pen Inject 30 Units into the skin at bedtime. 15 mL 5  . Multiple Vitamins-Minerals (ONE-A-DAY MENS 50+ ADVANTAGE PO) Take 1 tablet by mouth daily with breakfast.    . ONE TOUCH ULTRA TEST test strip USE TO TEST BLOOD SUGAR THREE TIMES DAILY 100 each 11  . ONETOUCH DELICA LANCETS 16X MISC USE TO TEST BLOOD SUGAR THREE TIMES  DAILY 100 each 11  . pravastatin (PRAVACHOL) 40 MG tablet Take 1 tablet (40 mg total) by mouth daily. 90 tablet 3   No current facility-administered medications for this visit.     Vitals:   08/26/16 0935  BP: (!) 150/74  Pulse: 72  SpO2: 96%  Weight: 235 lb 12.8 oz (107 kg)  Height: 6' (1.829 m)    PHYSICAL EXAM:  General:  Well appearing. No resp difficulty HEENT: normal Neck: supple. JVP flat. Carotids 2+ bilaterally; no bruits. No lymphadenopathy or thryomegaly appreciated. Cor: PMI normal. Regular rate & rhythm. No rubs or murmurs.  +S4.  Lungs: clear Abdomen: soft, nontender, nondistended. No hepatosplenomegaly. No bruits or masses. Good bowel sounds. Extremities: no cyanosis, clubbing, rash, edema Neuro: alert & orientedx3, cranial nerves grossly intact. Moves all 4 extremities w/o difficulty. Affect pleasant.   ECG:  12/09/14  SR rate 93 LVH with chronic T wave changes inverted in lateral leads  ASSESSMENT & PLAN:  1. Combined Systolic Heart Failure: Low EF in the past, thought to be due to poorly controlled BP.  LHC in 2004 and 2008 with no significant CAD.  Recovery of function by 2014 with  On meds May 06 2015  EF 40-45%  Nonischemic cardiomyopathy.  NYHA class II symptoms currently, not volume overloaded.   - Continue current diuretic regimen, Lasix 40 mg bid.  - Continue carvedilol 12.5 mg twice a day and hydralazine  - Continue current dose of hydralazine.   2. CKD Stage III: Followed by Dr Lorrene Reid . Not on ACEI.  Due to low GFR Better insight today into his need for dialysis . F/U Dr Lorrene Reid should probably have fistula placed in LUE at this point Not clear If he is too old for transplant told him to discuss with Dr Lorrene Reid   3. HTN: BP improved   4. Anemia:  Improved not on erythropoitin at this time  5. CVA: on plavix , bb, and statin 6. ED: ok to stop imdur and get Viagra from Dr [place] [name] 08/26/2016

## 2016-08-26 ENCOUNTER — Encounter: Payer: Self-pay | Admitting: Cardiovascular Disease

## 2016-08-26 ENCOUNTER — Ambulatory Visit (INDEPENDENT_AMBULATORY_CARE_PROVIDER_SITE_OTHER): Payer: PPO | Admitting: Cardiovascular Disease

## 2016-08-26 ENCOUNTER — Telehealth: Payer: Self-pay

## 2016-08-26 VITALS — BP 150/74 | HR 72 | Ht 72.0 in | Wt 235.8 lb

## 2016-08-26 DIAGNOSIS — I5022 Chronic systolic (congestive) heart failure: Secondary | ICD-10-CM | POA: Diagnosis not present

## 2016-08-26 NOTE — Telephone Encounter (Signed)
Needs to speak with nurse regarding meds.

## 2016-08-26 NOTE — Patient Instructions (Addendum)
**Note De-Identified Hiram Mciver Obfuscation** Medication Instructions:  Stop taking Imdur. All other medications remain the same.  Labwork: None  Testing/Procedures: None  Follow-Up: Your physician wants you to follow-up in: 6 months.You will receive a reminder letter in the mail two months in advance. If you don't receive a letter, please call our office to schedule the follow-up appointment.        If you need a refill on your cardiac medications before your next appointment, please call your pharmacy.

## 2016-08-30 NOTE — Telephone Encounter (Signed)
Have called and lm for rtc, today no answer, cuts off when it rings several times

## 2016-09-13 ENCOUNTER — Telehealth: Payer: Self-pay

## 2016-09-13 NOTE — Telephone Encounter (Signed)
Called pt about rescheduling appt. Number 1 did not work. LVM on Number 2. Mailed letter about rescheduled appt.

## 2016-09-14 ENCOUNTER — Telehealth: Payer: Self-pay | Admitting: Internal Medicine

## 2016-09-14 NOTE — Telephone Encounter (Signed)
APT. REMINDER CALL, LMTCB °

## 2016-09-15 ENCOUNTER — Encounter: Payer: Self-pay | Admitting: Internal Medicine

## 2016-09-15 ENCOUNTER — Ambulatory Visit (INDEPENDENT_AMBULATORY_CARE_PROVIDER_SITE_OTHER): Payer: PPO | Admitting: Internal Medicine

## 2016-09-15 VITALS — BP 141/71 | HR 65 | Temp 98.2°F | Ht 72.0 in | Wt 234.8 lb

## 2016-09-15 DIAGNOSIS — N529 Male erectile dysfunction, unspecified: Secondary | ICD-10-CM

## 2016-09-15 DIAGNOSIS — I10 Essential (primary) hypertension: Secondary | ICD-10-CM | POA: Diagnosis not present

## 2016-09-15 DIAGNOSIS — Z8249 Family history of ischemic heart disease and other diseases of the circulatory system: Secondary | ICD-10-CM

## 2016-09-15 DIAGNOSIS — Z79899 Other long term (current) drug therapy: Secondary | ICD-10-CM | POA: Diagnosis not present

## 2016-09-15 MED ORDER — SILDENAFIL CITRATE 50 MG PO TABS: 100.0000 mg | ORAL_TABLET | ORAL | 2 refills | Status: DC | PRN

## 2016-09-15 NOTE — Progress Notes (Signed)
[facility] Subjective:  HPI: [name] is a 61 y.o. male who presents to discuss erectile dysfunction. Please see problem based charting below for further details of his chronic medical problems     Review of Systems: Per history of present illness Objective:  Physical Exam: Vitals:   09/15/16 1115  BP: (!) 141/71  Pulse: 65  Temp: 98.2 F (36.8 C)  TempSrc: Oral  SpO2: 98%  Weight: 234 lb 12.8 oz (106.5 kg)  Height: 6' (1.829 m)   Physical Exam  Constitutional: He is well-developed, well-nourished, and in no distress. No distress.  HENT:  Head: Normocephalic and atraumatic.  Eyes: Conjunctivae are normal.  Cardiovascular: Normal rate, regular rhythm, normal heart sounds and intact distal pulses.   No murmur heard. Pulmonary/Chest: Effort normal and breath sounds normal. No respiratory distress. He has no wheezes. He has no rales.  Abdominal: Soft. Bowel sounds are normal. He exhibits no distension. There is no tenderness.  Musculoskeletal: He exhibits no edema.  Skin: Skin is warm and dry. He is not diaphoretic.  Psychiatric: Affect and judgment normal.  Nursing note and vitals reviewed.   Assessment & Plan:  ED (erectile dysfunction) History of present illness: Since her last visit he is seen his cardiologist Dr. [name] he has a history of nonischemic cardiomyopathy and thus allowed his Imdur to be discontinued assistance only for blood pressure control and reported that he may start a PDE 5 inhibitor.  Mr. Sees reports that he still is having difficulty obtaining and maintaining erections. He thinks he would be sexually active 1-2 times a week.  Assessment erectile dysfunction  Plan start Viagra 50 mg when necessary patient may self titrated up to 100 mg if needed. Follow-up 2 months.   Essential hypertension HPI: She reports she is taking his blood pressure medications as directed. He has stopped Imdur. He has no  complaints.  Assessment: Essential hypertension above goal  Plan continue hydralazine 100 mg 3 times a day, Lasix 80 mg twice a day, carvedilol 12.5 mg twice a day, Cartia 180 mg daily. Discussed low-salt diet, his blood pressure still above goal at next visit we'll consider increasingCartia.   Medications Ordered Meds ordered this encounter  Medications  . sildenafil (VIAGRA) 50 MG tablet    Sig: Take 2 tablets (100 mg total) by mouth as needed for erectile dysfunction.    Dispense:  20 tablet    Refill:  2    Patient no longer taking IMDUR, please ensure no refills of IMDUR   Other Orders No orders of the defined types were placed in this encounter.  Follow Up: Return in about 2 months (around 11/13/2016).

## 2016-09-15 NOTE — Patient Instructions (Signed)
Sildenafil tablets (Viagra) What is this medicine? SILDENAFIL (sil DEN a fil) is used to treat erection problems in men. This medicine may be used for other purposes; ask your health care provider or pharmacist if you have questions. COMMON BRAND NAME(S): Viagra What should I tell my health care provider before I take this medicine? They need to know if you have any of these conditions: -bleeding disorders -eye or vision problems, including a rare inherited eye disease called retinitis pigmentosa -anatomical deformation of the penis, Peyronie's disease, or history of priapism (painful and prolonged erection) -heart disease, angina, a history of heart attack, irregular heart beats, or other heart problems -high or low blood pressure -history of blood diseases, like sickle cell anemia or leukemia -history of stomach bleeding -kidney disease -liver disease -stroke -an unusual or allergic reaction to sildenafil, other medicines, foods, dyes, or preservatives -pregnant or trying to get pregnant -breast-feeding How should I use this medicine? Take this medicine by mouth with a glass of water. Follow the directions on the prescription label. The dose is usually taken 1 hour before sexual activity. You should not take the dose more than once per day. Do not take your medicine more often than directed. Talk to your pediatrician regarding the use of this medicine in children. This medicine is not used in children for this condition. Overdosage: If you think you have taken too much of this medicine contact a poison control center or emergency room at once. NOTE: This medicine is only for you. Do not share this medicine with others. What if I miss a dose? This does not apply. Do not take double or extra doses. What may interact with this medicine? Do not take this medicine with any of the following medications: -cisapride -nitrates like amyl nitrite, isosorbide dinitrate, isosorbide mononitrate,  nitroglycerin -riociguat This medicine may also interact with the following medications: -antiviral medicines for HIV or AIDS -bosentan -certain medicines for benign prostatic hyperplasia (BPH) -certain medicines for blood pressure -certain medicines for fungal infections like ketoconazole and itraconazole -cimetidine -erythromycin -rifampin This list may not describe all possible interactions. Give your health care provider a list of all the medicines, herbs, non-prescription drugs, or dietary supplements you use. Also tell them if you smoke, drink alcohol, or use illegal drugs. Some items may interact with your medicine. What should I watch for while using this medicine? If you notice any changes in your vision while taking this drug, call your doctor or health care professional as soon as possible. Stop using this medicine and call your health care provider right away if you have a loss of sight in one or both eyes. Contact your doctor or health care professional right away if you have an erection that lasts longer than 4 hours or if it becomes painful. This may be a sign of a serious problem and must be treated right away to prevent permanent damage. If you experience symptoms of nausea, dizziness, chest pain or arm pain upon initiation of sexual activity after taking this medicine, you should refrain from further activity and call your doctor or health care professional as soon as possible. Do not drink alcohol to excess (examples, 5 glasses of wine or 5 shots of whiskey) when taking this medicine. When taken in excess, alcohol can increase your chances of getting a headache or getting dizzy, increasing your heart rate or lowering your blood pressure. Using this medicine does not protect you or your partner against HIV infection (the virus that causes  AIDS) or other sexually transmitted diseases. What side effects may I notice from receiving this medicine? Side effects that you should report  to your doctor or health care professional as soon as possible: -allergic reactions like skin rash, itching or hives, swelling of the face, lips, or tongue -breathing problems -changes in hearing -changes in vision -chest pain -fast, irregular heartbeat -prolonged or painful erection -seizures Side effects that usually do not require medical attention (report to your doctor or health care professional if they continue or are bothersome): -back pain -dizziness -flushing -headache -indigestion -muscle aches -nausea -stuffy or runny nose This list may not describe all possible side effects. Call your doctor for medical advice about side effects. You may report side effects to FDA at 1-800-FDA-1088. Where should I keep my medicine? Keep out of reach of children. Store at room temperature between 15 and 30 degrees C (59 and 86 degrees F). Throw away any unused medicine after the expiration date. NOTE: This sheet is a summary. It may not cover all possible information. If you have questions about this medicine, talk to your doctor, pharmacist, or health care provider.  2017 Elsevier/Gold Standard (2015-07-15 12:00:25)

## 2016-09-21 NOTE — Assessment & Plan Note (Signed)
HPI: She reports she is taking his blood pressure medications as directed. He has stopped Imdur. He has no complaints.  Assessment: Essential hypertension above goal  Plan continue hydralazine 100 mg 3 times a day, Lasix 80 mg twice a day, carvedilol 12.5 mg twice a day, Cartia 180 mg daily. Discussed low-salt diet, his blood pressure still above goal at next visit we'll consider increasingCartia.

## 2016-09-21 NOTE — Assessment & Plan Note (Addendum)
History of present illness: Since her last visit he is seen his cardiologist Dr. [name] he has a history of nonischemic cardiomyopathy and thus allowed his Imdur to be discontinued assistance only for blood pressure control and reported that he may start a PDE 5 inhibitor.  Charles Hart reports that he still is having difficulty obtaining and maintaining erections. He thinks he would be sexually active 1-2 times a week.  Assessment erectile dysfunction  Plan start Viagra 50 mg when necessary patient may self titrated up to 100 mg if needed. Follow-up 2 months.

## 2016-09-23 ENCOUNTER — Other Ambulatory Visit (HOSPITAL_COMMUNITY): Payer: PPO

## 2016-09-23 ENCOUNTER — Encounter (HOSPITAL_COMMUNITY): Payer: PPO

## 2016-09-23 ENCOUNTER — Encounter: Payer: PPO | Admitting: Vascular Surgery

## 2016-10-21 ENCOUNTER — Encounter: Payer: Self-pay | Admitting: Vascular Surgery

## 2016-10-23 ENCOUNTER — Emergency Department (HOSPITAL_COMMUNITY)
Admission: EM | Admit: 2016-10-23 | Discharge: 2016-10-24 | Disposition: A | Discharge status: Home / Self Care | Payer: PPO | Attending: Physician Assistant | Admitting: Physician Assistant

## 2016-10-23 ENCOUNTER — Encounter (HOSPITAL_COMMUNITY): Payer: Self-pay

## 2016-10-23 ENCOUNTER — Emergency Department (HOSPITAL_COMMUNITY): Payer: PPO

## 2016-10-23 DIAGNOSIS — I5042 Chronic combined systolic (congestive) and diastolic (congestive) heart failure: Secondary | ICD-10-CM | POA: Insufficient documentation

## 2016-10-23 DIAGNOSIS — E1122 Type 2 diabetes mellitus with diabetic chronic kidney disease: Secondary | ICD-10-CM | POA: Insufficient documentation

## 2016-10-23 DIAGNOSIS — I130 Hypertensive heart and chronic kidney disease with heart failure and stage 1 through stage 4 chronic kidney disease, or unspecified chronic kidney disease: Secondary | ICD-10-CM | POA: Insufficient documentation

## 2016-10-23 DIAGNOSIS — E1140 Type 2 diabetes mellitus with diabetic neuropathy, unspecified: Secondary | ICD-10-CM | POA: Diagnosis not present

## 2016-10-23 DIAGNOSIS — Z8673 Personal history of transient ischemic attack (TIA), and cerebral infarction without residual deficits: Secondary | ICD-10-CM | POA: Insufficient documentation

## 2016-10-23 DIAGNOSIS — R109 Unspecified abdominal pain: Secondary | ICD-10-CM | POA: Diagnosis not present

## 2016-10-23 DIAGNOSIS — I252 Old myocardial infarction: Secondary | ICD-10-CM | POA: Insufficient documentation

## 2016-10-23 DIAGNOSIS — N184 Chronic kidney disease, stage 4 (severe): Secondary | ICD-10-CM | POA: Insufficient documentation

## 2016-10-23 LAB — BASIC METABOLIC PANEL
ANION GAP: 12 (ref 5–15)
BUN: 52 mg/dL — ABNORMAL HIGH (ref 6–20)
CO2: 26 mmol/L (ref 22–32)
Calcium: 7.5 mg/dL — ABNORMAL LOW (ref 8.9–10.3)
Chloride: 100 mmol/L — ABNORMAL LOW (ref 101–111)
Creatinine, Ser: 4.97 mg/dL — ABNORMAL HIGH (ref 0.61–1.24)
GFR calc Af Amer: 13 mL/min — ABNORMAL LOW (ref >60)
GFR, EST NON AFRICAN AMERICAN: 12 mL/min — AB (ref >60)
GLUCOSE: 128 mg/dL — AB (ref 65–99)
POTASSIUM: 3.4 mmol/L — AB (ref 3.5–5.1)
SODIUM: 138 mmol/L (ref 135–145)

## 2016-10-23 LAB — URINALYSIS, ROUTINE W REFLEX MICROSCOPIC
Bacteria, UA: NONE SEEN
Bilirubin Urine: NEGATIVE
GLUCOSE, UA: NEGATIVE mg/dL
Hgb urine dipstick: NEGATIVE
Ketones, ur: NEGATIVE mg/dL
Leukocytes, UA: NEGATIVE
Nitrite: NEGATIVE
Protein, ur: >=300 mg/dL — AB
Specific Gravity, Urine: 1.012 (ref 1.005–1.030)
Squamous Epithelial / LPF: NONE SEEN
pH: 6 (ref 5.0–8.0)

## 2016-10-23 LAB — CBC
HEMATOCRIT: 31.1 % — AB (ref 39.0–52.0)
HEMOGLOBIN: 10.1 g/dL — AB (ref 13.0–17.0)
MCH: 27.7 pg (ref 26.0–34.0)
MCHC: 32.5 g/dL (ref 30.0–36.0)
MCV: 85.4 fL (ref 78.0–100.0)
Platelets: 269 10*3/uL (ref 150–400)
RBC: 3.64 MIL/uL — ABNORMAL LOW (ref 4.22–5.81)
RDW: 12.3 % (ref 11.5–15.5)
WBC: 4.5 10*3/uL (ref 4.0–10.5)

## 2016-10-23 NOTE — ED Triage Notes (Signed)
Pt endorses left sided flank pain x 3 days. Pt states "i have kidney failure but not on dialysis" pt denies any associate urinary sx, n/v, or fever.

## 2016-10-23 NOTE — ED Notes (Signed)
ED Provider at bedside. 

## 2016-10-23 NOTE — ED Notes (Signed)
Patient transported to CT 

## 2016-10-23 NOTE — ED Notes (Signed)
Pt did not want to have IV placed.  Provider informed.

## 2016-10-23 NOTE — ED Provider Notes (Signed)
[facility] DEPT Provider Note   CSN: [id number] Arrival date & time: 10/23/16  1931     History   Chief Complaint Chief Complaint  Patient presents with  . Flank Pain    HPI KORBY RATAY is a 61 y.o. male.  HPI   Patient is a 61 -year-old male with history of ischemic cardiomyopathy, CHF, CVA, chronic kidney disease, nephrotic syndrome, MI, diabetes, hypertension presenting today with left CVA pain. Patient reports he feels dehydrated and like his left kidney. He reports the pain is worse on his left side but is on both sides. It is not worse with movement. He reports it comes and goes.  Patient reports he follows up with Dr. Lorrene Reid nephrology in [place].  Past Medical History:  Diagnosis Date  . Adenomatous colon polyp 07/02/2011   Last colonoscopy May 06, 2011 by Dr. Owens Loffler, who recommended repeat colonoscopy in 5 years.   . Background diabetic retinopathy 04/20/2012   Patient is followed by Dr. Katy Fitch   . Cardiomyopathy    LV function improved from 2004 to 2008.  Historically, moderately dilated LV with EF 30-40% by 2D echo 08/14/2002.  Mild CAD with severe LV dysfunction by cardiac cath 09/2002.  Normal coronary arteries and normal LV function by cardiac cath 09/19/2006.  A 2-D echo on 04/01/2009 showed mild concentric hypertrophy and normal systolic (LVEF  62-70%) and doppler C/W with grade 1 diastolic dysfunction.  . CHF (congestive heart failure) ([place])    LV function improved from 2004 to 2008.  Historically, moderately dilated LV with EF 30-40% by 2D echo 08/14/2002.  Mild CAD with severe LV dysfunction by cardiac cath 09/2002.  Normal coronary arteries and normal LV function by cardiac cath 09/19/2006.  A 2-D echo on 04/01/2009 showed mild concentric hypertrophy and normal systolic (LVEF  35-00%) and doppler C/W with grade 1 diastolic dysfunction..   . Chronic combined systolic and diastolic congestive heart failure ([place]) 05/21/2010   LV function improved  from 2004 to 2008.  Historically, moderately dilated LV with EF 30-40% by 2D echo 08/14/2002.  Mild CAD with severe LV dysfunction by cardiac cath 09/2002.  Normal coronary arteries and normal LV function by cardiac cath 09/19/2006.  A 2-D echo on 04/01/2009 showed mild concentric hypertrophy and normal systolic (LVEF  93-81%) and doppler parameters consistent with abnormal left   . CVA (cerebral vascular accident) ([place]) 07/04/2012   MRI of the brain 07/04/2012 showed an acute infarct in the right basal ganglia involving the anterior putamen, anterior limb internal capsule, and head of the caudate; this measured approximately 2.5 cm in diameter.     . Dermatitis   . Diabetes mellitus    type 2  . DIABETIC PERIPHERAL NEUROPATHY 08/03/2007   Qualifier: Diagnosis of  By: Marinda Elk MD, Sonia Side    . DM neuropathy, painful ([place])   . Hearing loss in right ear   . Hyperlipidemia   . Hypertension   . Hypertensive crisis 07/28/2012  . Hypertensive urgency 08/20/2014  . Myocardial infarction   . Nephrotic syndrome 02/18/2013   A 24-hour urine collection 03/04/2013 showed total protein of 5,460 g and creatinine clearance of 80 mL/minute.  Patient was seen by Seward Meth at [facility] and a repeat 24-hour urine showed 10,407 mg protein.  Patient underwent kidney biopsy on 05/30/2013; pathology showed advanced diffuse and nodular diabetic nephropathy with vascular changes consistent with long-standing difficult to control hypertension.       Patient Active Problem List  Diagnosis Date Noted  . Piriformis syndrome of right side 03/03/2016  . Muscle spasm of left shoulder area 03/03/2016  . Secondary hyperparathyroidism, renal ([place]) 02/09/2016  . Primary osteoarthritis of left knee 01/07/2016  . Low back pain 01/07/2016  . Joint stiffness 11/13/2015  . Age-related nuclear cataract of both eyes 10/07/2015  . Normocytic anemia 08/22/2014  . CKD stage 4 due to type 2 diabetes mellitus  ([place]) 06/03/2014  . Health care maintenance 05/16/2014  . Nephrotic syndrome 02/18/2013  . Obstructive sleep apnea 07/26/2012  . History of stroke 07/04/2012  . 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 ED (erectile dysfunction) 04/18/2012  . Adenomatous colon polyp 07/02/2011  . Chronic combined systolic and diastolic congestive heart failure ([place]) 05/21/2010  . CAROTID BRUIT, RIGHT 04/01/2009  . Diabetic peripheral neuropathy associated with type 2 diabetes mellitus ([place]) 08/03/2007  . HEARING LOSS, RIGHT EAR 07/30/2007  . DM (diabetes mellitus), type 2 with renal complications ([place]) 88/89/1694  . Hyperlipidemia 06/01/2006  . Essential hypertension 06/01/2006    Past Surgical History:  Procedure Laterality Date  . CARDIAC CATHETERIZATION     3 times  . COLONOSCOPY    . FOOT SURGERY    . POLYPECTOMY         Home Medications    Prior to Admission medications   Medication Sig Start Date End Date Taking? Authorizing Provider  CARTIA XT 180 MG 24 hr capsule TAKE 1 CAPSULE(180 MG) BY MOUTH DAILY 08/16/16  Yes Lucious Groves, DO  carvedilol (COREG) 12.5 MG tablet Take 1 tablet (12.5 mg total) by mouth 2 (two) times daily. 08/16/16  Yes Lucious Groves, DO  clopidogrel (PLAVIX) 75 MG tablet Take 1 tablet (75 mg total) by mouth daily. 10/15/15  Yes Lucious Groves, DO  furosemide (LASIX) 80 MG tablet Take 1 tablet (80 mg total) by mouth 2 (two) times daily. 08/16/16  Yes Lucious Groves, DO  gabapentin (NEURONTIN) 300 MG capsule Take 300 mg by mouth at bedtime 08/16/16  Yes Lucious Groves, DO  hydrALAZINE (APRESOLINE) 100 MG tablet Take 1 tablet (100 mg total) by mouth 3 (three) times daily. 10/15/15 10/23/16 Yes Lucious Groves, DO  insulin aspart (NOVOLOG FLEXPEN) 100 UNIT/ML FlexPen Inject 7 Units into the skin 3 (three) times daily with meals. Patient taking differently:  Inject 7 Units into the skin 3 (three) times daily with meals. Or sliding scale is used when patient has access to glucometer: If BGL is 150 or greater, 1 unit is given for every 50 points (above a reading of 150) 10/15/15  Yes Lucious Groves, DO  Insulin Glargine (LANTUS SOLOSTAR) 100 UNIT/ML Solostar Pen Inject 30 Units into the skin at bedtime. 08/16/16  Yes Lucious Groves, DO  Multiple Vitamins-Minerals (ONE-A-DAY MENS 50+ ADVANTAGE PO) Take 1 tablet by mouth daily with breakfast.   Yes Historical Provider, MD  pravastatin (PRAVACHOL) 40 MG tablet Take 1 tablet (40 mg total) by mouth daily. 10/15/15  Yes Lucious Groves, DO  sildenafil (VIAGRA) 50 MG tablet Take 2 tablets (100 mg total) by mouth as needed for erectile dysfunction. 09/15/16 09/15/17 Yes Lucious Groves, DO  BD PEN NEEDLE NANO U/F 32G X 4 MM MISC USE TO INJECT LANTUS ONCE A DAY AND NOVOLOG THREE TIMES DAILY 06/16/15   Lucious Groves, DO  ONE TOUCH ULTRA TEST test strip USE TO  TEST BLOOD SUGAR THREE TIMES DAILY 07/16/15   Lucious Groves, DO  [facility] DELICA LANCETS 96Q MISC USE TO TEST BLOOD SUGAR THREE TIMES DAILY 07/16/15   Lucious Groves, DO    Family History Family History  Problem Relation Age of Onset  . Aneurysm Father 59    died of rupture  . Colon cancer Sister     Social History Social History  Substance Use Topics  . Smoking status: Never Smoker  . Smokeless tobacco: Never Used  . Alcohol use No     Comment: Wine rarely.     Allergies   Amlodipine; Ivp dye [iodinated diagnostic agents]; Lisinopril; and Red dye   Review of Systems Review of Systems  Constitutional: Negative for activity change.  Respiratory: Negative for shortness of breath.   Cardiovascular: Negative for chest pain.  Gastrointestinal: Negative for abdominal pain.  Genitourinary: Negative for difficulty urinating and dysuria.  Musculoskeletal: Positive for back pain.  All other systems reviewed and are negative.    Physical Exam Updated Vital  Signs BP 153/89 (BP Location: Right Arm)   Pulse 78   Temp 98.6 F (37 C) (Oral)   Resp 24   Ht 6' (1.829 m)   Wt 230 lb (104.3 kg)   SpO2 98%   BMI 31.19 kg/m   Physical Exam  Constitutional: He is oriented to person, place, and time. He appears well-nourished.  HENT:  Head: Normocephalic.  Eyes: Conjunctivae are normal. Right eye exhibits no discharge. Left eye exhibits no discharge.  Cardiovascular: Normal rate and regular rhythm.   No murmur heard. Pulmonary/Chest: Effort normal and breath sounds normal. No respiratory distress.  Abdominal: Soft. There is no tenderness.  + CVA tendernes on left  Neurological: He is oriented to person, place, and time.  Skin: Skin is warm and dry. He is not diaphoretic.  Psychiatric: He has a normal mood and affect. His behavior is normal.     ED Treatments / Results  Labs (all labs ordered are listed, but only abnormal results are displayed) Labs Reviewed  URINALYSIS, ROUTINE W REFLEX MICROSCOPIC - Abnormal; Notable for the following:       Result Value   Protein, ur >=300 (*)    All other components within normal limits  BASIC METABOLIC PANEL - Abnormal; Notable for the following:    Potassium 3.4 (*)    Chloride 100 (*)    Glucose, Bld 128 (*)    BUN 52 (*)    Creatinine, Ser 4.97 (*)    Calcium 7.5 (*)    GFR calc non Af Amer 12 (*)    GFR calc Af Amer 13 (*)    All other components within normal limits  CBC - Abnormal; Notable for the following:    RBC 3.64 (*)    Hemoglobin 10.1 (*)    HCT 31.1 (*)    All other components within normal limits    EKG  EKG Interpretation None       Radiology Ct Renal Stone Study  Result Date: 10/23/2016 CLINICAL DATA:  Left flank pain.  History of nephrotic syndrome. EXAM: CT ABDOMEN AND PELVIS WITHOUT CONTRAST TECHNIQUE: Multidetector CT imaging of the abdomen and pelvis was performed following the standard protocol without IV contrast. COMPARISON:  11/13/2014 FINDINGS: Lower  chest: Lung bases are clear. Previous left basal infiltrate is resolved. Calcified granuloma in the right lung base. Hepatobiliary: Trans would Pancreas: Unremarkable. No pancreatic ductal dilatation or surrounding inflammatory changes. Spleen: Normal in size  without focal abnormality. Adrenals/Urinary Tract: No adrenal gland nodules. No hydronephrosis or hydroureter. No renal, ureteral, or bladder stones identified. Bladder is decompressed. Somewhat nodular appearance of the anterior lower pole of the left kidney. I am suspicious of a solid mass in this area measuring about 3.6 cm diameter. Suggest further evaluation with CT abdomen and pelvis with contrast or MRI with contrast. There is also a small cyst in the lower pole of the left kidney measuring about 1.7 cm diameter. Stomach/Bowel: Stomach is within normal limits. Appendix appears normal. No evidence of bowel wall thickening, distention, or inflammatory changes. Vascular/Lymphatic: No significant vascular findings are present. No enlarged abdominal or pelvic lymph nodes. Reproductive: Prostate is unremarkable. Other: No abdominal wall hernia or abnormality. No abdominopelvic ascites. Musculoskeletal: Degenerative changes in the spine and hips. No destructive bone lesions. IMPRESSION: No renal or ureteral stone or obstruction demonstrated. Probable solid mass lesion in the lower pole left kidney measuring 3.6 cm diameter. Suggest follow-up with either contrast-enhanced CT or MRI. Electronically Signed   By: Lucienne Capers M.D.   On: 10/23/2016 23:02    Procedures Procedures (including critical care time)  Medications Ordered in ED Medications - No data to display   Initial Impression / Assessment and Plan / ED Course  I have reviewed the triage vital signs and the nursing notes.  Pertinent labs & imaging results that were available during my care of the patient were reviewed by me and considered in my medical decision making (see chart for  details).    Patient is a 59 -year-old male with history of ischemic cardiomyopathy, CHF, CVA, chronic kidney disease, nephrotic syndrome, MI, diabetes, hypertension presenting today with left CVA pain..  Patient has AKI. Patient's last kidney function here was 3.5. Now 4.6. Patient's potassium doesn't require dialysis this time. However patient does have worsening creatinine function, could use IV fluids and recheck.  Will check with nephrology. (we cant see [facility] notes)   11:29 PM Discussed with Dr. Maia Plan coverage. With only a 1 point increase in Cr from 3 to 4 over a year, they don't recommend admission.  CT shows mass- will refer to renal since he has close relationship with Lorrene Reid for further imaging and follow up as outpatient. Patietn understands ad will calll for follow up in the am.   Final Clinical Impressions(s) / ED Diagnoses   Final diagnoses:  None    New Prescriptions New Prescriptions   No medications on file     Elpidio Thielen Julio Alm, MD 10/23/16 2330

## 2016-10-23 NOTE — ED Notes (Signed)
Pt informed RN that he was allergic to the IV dye, RN verified w/ CT, no dye used in CT Renal study.  Informed Pt.

## 2016-10-24 NOTE — Discharge Instructions (Signed)
You were seen today for bilateral back pain. Your creatinine shows an increase from before. In addition you were found to have something on the upper pole of the left kidney that needs further evaluation as an outpatient. Please tell Dr. Lorrene Reid that you need either an outpatient CT with contrast or MRI to better evaluate this lesion.

## 2016-10-28 ENCOUNTER — Encounter: Payer: PPO | Admitting: Vascular Surgery

## 2016-10-28 ENCOUNTER — Other Ambulatory Visit (HOSPITAL_COMMUNITY): Payer: PPO

## 2016-10-28 ENCOUNTER — Encounter (HOSPITAL_COMMUNITY): Payer: PPO

## 2016-11-15 ENCOUNTER — Other Ambulatory Visit: Payer: Self-pay

## 2016-11-16 ENCOUNTER — Telehealth: Payer: Self-pay | Admitting: Internal Medicine

## 2016-11-16 MED ORDER — GABAPENTIN 300 MG PO CAPS: ORAL_CAPSULE | ORAL | 3 refills | Status: DC

## 2016-11-16 MED ORDER — HYDRALAZINE HCL 100 MG PO TABS: 100.0000 mg | ORAL_TABLET | Freq: Three times a day (TID) | ORAL | 3 refills | Status: DC

## 2016-11-16 MED ORDER — CARTIA XT 180 MG PO CP24: ORAL_CAPSULE | ORAL | 3 refills | Status: DC

## 2016-11-16 NOTE — Telephone Encounter (Signed)
Calling to confirm appt for 11/17/16 at 8:45 lmtcb

## 2016-11-17 ENCOUNTER — Ambulatory Visit: Payer: PPO | Admitting: Internal Medicine

## 2016-12-07 ENCOUNTER — Encounter: Payer: Self-pay | Admitting: Vascular Surgery

## 2016-12-16 ENCOUNTER — Encounter: Payer: PPO | Admitting: Vascular Surgery

## 2016-12-16 ENCOUNTER — Inpatient Hospital Stay (HOSPITAL_COMMUNITY): Admission: RE | Admit: 2016-12-16 | Payer: PPO | Source: Ambulatory Visit

## 2016-12-16 ENCOUNTER — Encounter (HOSPITAL_COMMUNITY): Payer: PPO

## 2016-12-29 ENCOUNTER — Other Ambulatory Visit: Payer: Self-pay | Admitting: Nephrology

## 2016-12-29 DIAGNOSIS — N2889 Other specified disorders of kidney and ureter: Secondary | ICD-10-CM

## 2017-01-04 ENCOUNTER — Encounter: Payer: Self-pay | Admitting: Vascular Surgery

## 2017-01-10 ENCOUNTER — Encounter (HOSPITAL_COMMUNITY): Payer: PPO

## 2017-01-10 ENCOUNTER — Other Ambulatory Visit (HOSPITAL_COMMUNITY): Payer: PPO

## 2017-01-11 DIAGNOSIS — N2581 Secondary hyperparathyroidism of renal origin: Secondary | ICD-10-CM | POA: Diagnosis not present

## 2017-01-11 DIAGNOSIS — N184 Chronic kidney disease, stage 4 (severe): Secondary | ICD-10-CM | POA: Diagnosis not present

## 2017-01-11 DIAGNOSIS — D631 Anemia in chronic kidney disease: Secondary | ICD-10-CM | POA: Diagnosis not present

## 2017-01-13 ENCOUNTER — Encounter: Payer: PPO | Admitting: Vascular Surgery

## 2017-01-15 ENCOUNTER — Ambulatory Visit
Admission: RE | Admit: 2017-01-15 | Discharge: 2017-01-15 | Disposition: A | Discharge status: Home / Self Care | Payer: PPO | Source: Ambulatory Visit | Attending: Nephrology | Admitting: Nephrology

## 2017-01-15 DIAGNOSIS — N2889 Other specified disorders of kidney and ureter: Secondary | ICD-10-CM

## 2017-01-15 DIAGNOSIS — N281 Cyst of kidney, acquired: Secondary | ICD-10-CM | POA: Diagnosis not present

## 2017-01-17 ENCOUNTER — Other Ambulatory Visit: Payer: Self-pay | Admitting: *Deleted

## 2017-01-17 MED ORDER — FUROSEMIDE 80 MG PO TABS: 80.0000 mg | ORAL_TABLET | Freq: Two times a day (BID) | ORAL | 1 refills | Status: DC

## 2017-01-17 MED ORDER — PRAVASTATIN SODIUM 40 MG PO TABS: 40.0000 mg | ORAL_TABLET | Freq: Every day | ORAL | 3 refills | Status: DC

## 2017-01-17 MED ORDER — CLOPIDOGREL BISULFATE 75 MG PO TABS: 75.0000 mg | ORAL_TABLET | Freq: Every day | ORAL | 3 refills | Status: DC

## 2017-01-25 ENCOUNTER — Encounter: Payer: Self-pay | Admitting: Vascular Surgery

## 2017-01-31 DIAGNOSIS — N2889 Other specified disorders of kidney and ureter: Secondary | ICD-10-CM | POA: Diagnosis not present

## 2017-01-31 DIAGNOSIS — E1129 Type 2 diabetes mellitus with other diabetic kidney complication: Secondary | ICD-10-CM | POA: Diagnosis not present

## 2017-01-31 DIAGNOSIS — N2581 Secondary hyperparathyroidism of renal origin: Secondary | ICD-10-CM | POA: Diagnosis not present

## 2017-01-31 DIAGNOSIS — E1322 Other specified diabetes mellitus with diabetic chronic kidney disease: Secondary | ICD-10-CM | POA: Diagnosis not present

## 2017-01-31 DIAGNOSIS — I5040 Unspecified combined systolic (congestive) and diastolic (congestive) heart failure: Secondary | ICD-10-CM | POA: Diagnosis not present

## 2017-01-31 DIAGNOSIS — I129 Hypertensive chronic kidney disease with stage 1 through stage 4 chronic kidney disease, or unspecified chronic kidney disease: Secondary | ICD-10-CM | POA: Diagnosis not present

## 2017-01-31 DIAGNOSIS — E669 Obesity, unspecified: Secondary | ICD-10-CM | POA: Diagnosis not present

## 2017-01-31 DIAGNOSIS — D631 Anemia in chronic kidney disease: Secondary | ICD-10-CM | POA: Diagnosis not present

## 2017-01-31 DIAGNOSIS — E785 Hyperlipidemia, unspecified: Secondary | ICD-10-CM | POA: Diagnosis not present

## 2017-01-31 DIAGNOSIS — N184 Chronic kidney disease, stage 4 (severe): Secondary | ICD-10-CM | POA: Diagnosis not present

## 2017-02-02 ENCOUNTER — Ambulatory Visit (HOSPITAL_COMMUNITY)
Admission: RE | Admit: 2017-02-02 | Discharge: 2017-02-02 | Disposition: A | Discharge status: Home / Self Care | Payer: PPO | Source: Ambulatory Visit | Attending: Vascular Surgery | Admitting: Vascular Surgery

## 2017-02-02 ENCOUNTER — Ambulatory Visit (INDEPENDENT_AMBULATORY_CARE_PROVIDER_SITE_OTHER)
Admission: RE | Admit: 2017-02-02 | Discharge: 2017-02-02 | Disposition: A | Discharge status: Home / Self Care | Payer: PPO | Source: Ambulatory Visit | Attending: Vascular Surgery | Admitting: Vascular Surgery

## 2017-02-02 DIAGNOSIS — Z01810 Encounter for preprocedural cardiovascular examination: Secondary | ICD-10-CM

## 2017-02-07 ENCOUNTER — Encounter: Payer: Self-pay | Admitting: Vascular Surgery

## 2017-02-07 ENCOUNTER — Ambulatory Visit (INDEPENDENT_AMBULATORY_CARE_PROVIDER_SITE_OTHER): Payer: PPO | Admitting: Vascular Surgery

## 2017-02-07 VITALS — BP 156/87 | HR 75 | Temp 97.7°F | Resp 20 | Ht 72.0 in | Wt 220.0 lb

## 2017-02-07 DIAGNOSIS — N184 Chronic kidney disease, stage 4 (severe): Secondary | ICD-10-CM | POA: Diagnosis not present

## 2017-02-07 NOTE — Progress Notes (Signed)
[facility] [place]  Patient name: Charles Hart MRN: [id number] DOB: 08/31/1955 Sex: male  REASON FOR CONSULT: Discussed access for hemodialysis  HPI: Charles Hart is a 61 y.o. male, who is seen today for discussion of access for hemodialysis. He has progression of chronic renal insufficiency. Is not stage of needing hemodialysis currently and we have been requested to place an AV fistula possible not wait for placement of AV graft. Patient has very poor understanding of issues regarding renal failure. Has questions regarding frequency of hemodialysis and duration of hemodialysis. Questions regarding renal transplant. Questions regarding the ability to work while on hemodialysis. I answered these as best I could.  Past Medical History:  Diagnosis Date  . Adenomatous colon polyp 07/02/2011   Last colonoscopy May 06, 2011 by Dr. Owens Loffler, who recommended repeat colonoscopy in 5 years.   . Background diabetic retinopathy 04/20/2012   Patient is followed by Dr. Katy Fitch   . Cardiomyopathy    LV function improved from 2004 to 2008.  Historically, moderately dilated LV with EF 30-40% by 2D echo 08/14/2002.  Mild CAD with severe LV dysfunction by cardiac cath 09/2002.  Normal coronary arteries and normal LV function by cardiac cath 09/19/2006.  A 2-D echo on 04/01/2009 showed mild concentric hypertrophy and normal systolic (LVEF  38-10%) and doppler C/W with grade 1 diastolic dysfunction.  . CHF (congestive heart failure) ([place])    LV function improved from 2004 to 2008.  Historically, moderately dilated LV with EF 30-40% by 2D echo 08/14/2002.  Mild CAD with severe LV dysfunction by cardiac cath 09/2002.  Normal coronary arteries and normal LV function by cardiac cath 09/19/2006.  A 2-D echo on 04/01/2009 showed mild concentric hypertrophy and normal systolic (LVEF  17-51%) and doppler C/W with grade 1 diastolic dysfunction..   .  Chronic combined systolic and diastolic congestive heart failure ([place]) 05/21/2010   LV function improved from 2004 to 2008.  Historically, moderately dilated LV with EF 30-40% by 2D echo 08/14/2002.  Mild CAD with severe LV dysfunction by cardiac cath 09/2002.  Normal coronary arteries and normal LV function by cardiac cath 09/19/2006.  A 2-D echo on 04/01/2009 showed mild concentric hypertrophy and normal systolic (LVEF  02-58%) and doppler parameters consistent with abnormal left   . CVA (cerebral vascular accident) ([place]) 07/04/2012   MRI of the brain 07/04/2012 showed an acute infarct in the right basal ganglia involving the anterior putamen, anterior limb internal capsule, and head of the caudate; this measured approximately 2.5 cm in diameter.     . Dermatitis   . Diabetes mellitus    type 2  . DIABETIC PERIPHERAL NEUROPATHY 08/03/2007   Qualifier: Diagnosis of  By: Marinda Elk MD, Sonia Side    . DM neuropathy, painful ([place])   . Hearing loss in right ear   . Hyperlipidemia   . Hypertension   . Hypertensive crisis 07/28/2012  . Hypertensive urgency 08/20/2014  . Myocardial infarction ([place])   . Nephrotic syndrome 02/18/2013   A 24-hour urine collection 03/04/2013 showed total protein of 5,460 g and creatinine clearance of 80 mL/minute.  Patient was seen by Seward Meth at [place] and a repeat 24-hour urine showed 10,407 mg protein.  Patient underwent kidney biopsy on 05/30/2013; pathology showed advanced diffuse and nodular diabetic nephropathy with vascular changes consistent with long-standing difficult to control hypertension.       Family History  Problem Relation Age of Onset  .  Aneurysm Father 52       died of rupture  . Colon cancer Sister     SOCIAL HISTORY: Social History   Social History  . Marital status: Married    Spouse name: N/A  . Number of children: N/A  . Years of education: N/A   Occupational History  . Not on file.   Social History Main  Topics  . Smoking status: Never Smoker  . Smokeless tobacco: Never Used  . Alcohol use No     Comment: Wine rarely.  . Drug use: No  . Sexual activity: Not on file   Other Topics Concern  . Not on file   Social History Narrative  . No narrative on file    Allergies  Allergen Reactions  . Amlodipine Swelling  . Ivp Dye [Iodinated Diagnostic Agents] Other (See Comments)    Per patient's Nephrologist, he doesn't want the patient exposed to ANY dye because of issues with his kidneys  . Lisinopril Cough  . Red Dye Other (See Comments)    NO dye of any kind (issues with his kidneys)    Current Outpatient Prescriptions  Medication Sig Dispense Refill  . BD PEN NEEDLE NANO U/F 32G X 4 MM MISC USE TO INJECT LANTUS ONCE A DAY AND NOVOLOG THREE TIMES DAILY 200 each 2  . CARTIA XT 180 MG 24 hr capsule TAKE 1 CAPSULE(180 MG) BY MOUTH DAILY 90 capsule 3  . carvedilol (COREG) 12.5 MG tablet Take 1 tablet (12.5 mg total) by mouth 2 (two) times daily. 60 tablet 6  . clopidogrel (PLAVIX) 75 MG tablet Take 1 tablet (75 mg total) by mouth daily. 90 tablet 3  . furosemide (LASIX) 80 MG tablet Take 1 tablet (80 mg total) by mouth 2 (two) times daily. 180 tablet 1  . gabapentin (NEURONTIN) 300 MG capsule Take 300 mg by mouth at bedtime 90 capsule 3  . hydrALAZINE (APRESOLINE) 100 MG tablet Take 1 tablet (100 mg total) by mouth 3 (three) times daily. 270 tablet 3  . insulin aspart (NOVOLOG FLEXPEN) 100 UNIT/ML FlexPen Inject 7 Units into the skin 3 (three) times daily with meals. (Patient taking differently: Inject 7 Units into the skin 3 (three) times daily with meals. Or sliding scale is used when patient has access to glucometer: If BGL is 150 or greater, 1 unit is given for every 50 points (above a reading of 150)) 15 mL 5  . Insulin Glargine (LANTUS SOLOSTAR) 100 UNIT/ML Solostar Pen Inject 30 Units into the skin at bedtime. 15 mL 5  . Multiple Vitamins-Minerals (ONE-A-DAY MENS 50+ ADVANTAGE PO)  Take 1 tablet by mouth daily with breakfast.    . ONE TOUCH ULTRA TEST test strip USE TO TEST BLOOD SUGAR THREE TIMES DAILY 100 each 11  . ONETOUCH DELICA LANCETS 62B MISC USE TO TEST BLOOD SUGAR THREE TIMES DAILY 100 each 11  . pravastatin (PRAVACHOL) 40 MG tablet Take 1 tablet (40 mg total) by mouth daily. 90 tablet 3  . sildenafil (VIAGRA) 50 MG tablet Take 2 tablets (100 mg total) by mouth as needed for erectile dysfunction. 20 tablet 2   No current facility-administered medications for this visit.     REVIEW OF SYSTEMS:  [X]  denotes positive finding, [ ]  denotes negative finding Cardiac  Comments:  Chest pain or chest pressure:    Shortness of breath upon exertion:    Short of breath when lying flat: x   Irregular heart rhythm:  Vascular    Pain in calf, thigh, or hip brought on by ambulation:    Pain in feet at night that wakes you up from your sleep:     Blood clot in your veins:    Leg swelling:         Pulmonary    Oxygen at home:    Productive cough:     Wheezing:         Neurologic    Sudden weakness in arms or legs:     Sudden numbness in arms or legs:     Sudden onset of difficulty speaking or slurred speech:    Temporary loss of vision in one eye:     Problems with dizziness:         Gastrointestinal    Blood in stool:     Vomited blood:         Genitourinary    Burning when urinating:     Blood in urine:        Psychiatric    Major depression:         Hematologic    Bleeding problems:    Problems with blood clotting too easily:        Skin    Rashes or ulcers:        Constitutional    Fever or chills:      PHYSICAL EXAM: Vitals:   02/07/17 1149  BP: (!) 156/87  Pulse: 75  Resp: 20  Temp: 97.7 F (36.5 C)  TempSrc: Oral  SpO2: 97%  Weight: 220 lb (99.8 kg)  Height: 6' (1.829 m)    GENERAL: The patient is a well-nourished male, in no acute distress. The vital signs are documented above. CARDIOVASCULAR: 2+ radial pulses  bilaterally. Very small surface veins bilaterally PULMONARY: There is good air exchange  ABDOMEN: Soft and non-tender  MUSCULOSKELETAL: There are no major deformities or cyanosis. NEUROLOGIC: No focal weakness or paresthesias are detected. SKIN: There are no ulcers or rashes noted. PSYCHIATRIC: The patient has a normal affect.  DATA:  Noninvasive studies of his upper extremities revealed normal triphasic waveforms in his brachial radial and ulnar arteries bilaterally.  Vein map showed very small cephalic and basilic veins bilaterally  MEDICAL ISSUES: I reimaged his veins with SonoSite ultrasound and concur with the vein map from our vascular lab. He has very small cephalic and basilic veins throughout his forearm and upper arm. Feel he is a fistula candidate. I explained the use of hemodialysis tunneled catheters, AV fistulas and AV graft. Explained that we would recommend deferring plus patient of graft as Dr. Lorrene Reid has suggested until he is near need for access. He will not have active follow-up with [name]. We will see him again if he progresses to need an AV graft for hemodialysis.   Rosetta Posner, MD FACS [facility] Tel [phone] Pager [phone]

## 2017-02-10 DIAGNOSIS — D3002 Benign neoplasm of left kidney: Secondary | ICD-10-CM | POA: Diagnosis not present

## 2017-02-10 DIAGNOSIS — N5201 Erectile dysfunction due to arterial insufficiency: Secondary | ICD-10-CM | POA: Diagnosis not present

## 2017-02-14 ENCOUNTER — Other Ambulatory Visit: Payer: Self-pay | Admitting: Urology

## 2017-02-14 DIAGNOSIS — N2889 Other specified disorders of kidney and ureter: Secondary | ICD-10-CM

## 2017-02-23 ENCOUNTER — Other Ambulatory Visit (HOSPITAL_COMMUNITY): Payer: Self-pay | Admitting: Nephrology

## 2017-02-23 ENCOUNTER — Ambulatory Visit (HOSPITAL_BASED_OUTPATIENT_CLINIC_OR_DEPARTMENT_OTHER)
Admission: RE | Admit: 2017-02-23 | Discharge: 2017-02-23 | Disposition: A | Discharge status: Home / Self Care | Payer: PPO | Source: Ambulatory Visit

## 2017-02-23 DIAGNOSIS — Z01818 Encounter for other preprocedural examination: Secondary | ICD-10-CM

## 2017-02-23 DIAGNOSIS — N185 Chronic kidney disease, stage 5: Secondary | ICD-10-CM | POA: Diagnosis not present

## 2017-02-23 DIAGNOSIS — Z91048 Other nonmedicinal substance allergy status: Secondary | ICD-10-CM | POA: Diagnosis not present

## 2017-02-23 DIAGNOSIS — I371 Nonrheumatic pulmonary valve insufficiency: Secondary | ICD-10-CM | POA: Insufficient documentation

## 2017-02-23 DIAGNOSIS — I429 Cardiomyopathy, unspecified: Secondary | ICD-10-CM | POA: Insufficient documentation

## 2017-02-23 DIAGNOSIS — Z794 Long term (current) use of insulin: Secondary | ICD-10-CM | POA: Diagnosis not present

## 2017-02-23 DIAGNOSIS — Z7902 Long term (current) use of antithrombotics/antiplatelets: Secondary | ICD-10-CM | POA: Diagnosis not present

## 2017-02-23 DIAGNOSIS — I255 Ischemic cardiomyopathy: Secondary | ICD-10-CM | POA: Diagnosis not present

## 2017-02-23 DIAGNOSIS — I110 Hypertensive heart disease with heart failure: Secondary | ICD-10-CM | POA: Insufficient documentation

## 2017-02-23 DIAGNOSIS — I5023 Acute on chronic systolic (congestive) heart failure: Secondary | ICD-10-CM | POA: Diagnosis not present

## 2017-02-23 DIAGNOSIS — N184 Chronic kidney disease, stage 4 (severe): Secondary | ICD-10-CM | POA: Diagnosis not present

## 2017-02-23 DIAGNOSIS — I340 Nonrheumatic mitral (valve) insufficiency: Secondary | ICD-10-CM | POA: Diagnosis not present

## 2017-02-23 DIAGNOSIS — I509 Heart failure, unspecified: Secondary | ICD-10-CM | POA: Insufficient documentation

## 2017-02-23 DIAGNOSIS — I252 Old myocardial infarction: Secondary | ICD-10-CM | POA: Insufficient documentation

## 2017-02-23 DIAGNOSIS — I132 Hypertensive heart and chronic kidney disease with heart failure and with stage 5 chronic kidney disease, or end stage renal disease: Secondary | ICD-10-CM | POA: Diagnosis not present

## 2017-02-23 DIAGNOSIS — D3002 Benign neoplasm of left kidney: Secondary | ICD-10-CM

## 2017-02-23 DIAGNOSIS — Z79899 Other long term (current) drug therapy: Secondary | ICD-10-CM | POA: Diagnosis not present

## 2017-02-23 DIAGNOSIS — E1122 Type 2 diabetes mellitus with diabetic chronic kidney disease: Secondary | ICD-10-CM | POA: Diagnosis not present

## 2017-02-23 DIAGNOSIS — R0602 Shortness of breath: Secondary | ICD-10-CM | POA: Diagnosis not present

## 2017-02-23 DIAGNOSIS — E119 Type 2 diabetes mellitus without complications: Secondary | ICD-10-CM | POA: Insufficient documentation

## 2017-02-23 DIAGNOSIS — E785 Hyperlipidemia, unspecified: Secondary | ICD-10-CM

## 2017-02-23 DIAGNOSIS — I161 Hypertensive emergency: Secondary | ICD-10-CM | POA: Diagnosis not present

## 2017-02-23 DIAGNOSIS — E11319 Type 2 diabetes mellitus with unspecified diabetic retinopathy without macular edema: Secondary | ICD-10-CM | POA: Diagnosis not present

## 2017-02-23 DIAGNOSIS — I5043 Acute on chronic combined systolic (congestive) and diastolic (congestive) heart failure: Secondary | ICD-10-CM | POA: Diagnosis not present

## 2017-02-23 DIAGNOSIS — I130 Hypertensive heart and chronic kidney disease with heart failure and stage 1 through stage 4 chronic kidney disease, or unspecified chronic kidney disease: Secondary | ICD-10-CM | POA: Diagnosis not present

## 2017-02-23 DIAGNOSIS — Z888 Allergy status to other drugs, medicaments and biological substances status: Secondary | ICD-10-CM | POA: Diagnosis not present

## 2017-02-23 DIAGNOSIS — Z91040 Latex allergy status: Secondary | ICD-10-CM | POA: Diagnosis not present

## 2017-02-23 DIAGNOSIS — E1142 Type 2 diabetes mellitus with diabetic polyneuropathy: Secondary | ICD-10-CM | POA: Diagnosis not present

## 2017-02-23 DIAGNOSIS — Z8673 Personal history of transient ischemic attack (TIA), and cerebral infarction without residual deficits: Secondary | ICD-10-CM | POA: Diagnosis not present

## 2017-02-23 NOTE — Progress Notes (Signed)
  Echocardiogram 2D Echocardiogram has been performed.  Charles Hart 02/23/2017, 9:08 AM

## 2017-02-24 ENCOUNTER — Other Ambulatory Visit: Payer: Self-pay | Admitting: *Deleted

## 2017-02-24 DIAGNOSIS — E1165 Type 2 diabetes mellitus with hyperglycemia: Principal | ICD-10-CM

## 2017-02-24 DIAGNOSIS — Z794 Long term (current) use of insulin: Principal | ICD-10-CM

## 2017-02-24 DIAGNOSIS — IMO0002 Reserved for concepts with insufficient information to code with codable children: Secondary | ICD-10-CM

## 2017-02-24 DIAGNOSIS — E1122 Type 2 diabetes mellitus with diabetic chronic kidney disease: Secondary | ICD-10-CM

## 2017-02-24 DIAGNOSIS — N183 Chronic kidney disease, stage 3 (moderate): Principal | ICD-10-CM

## 2017-02-24 MED ORDER — INSULIN ASPART 100 UNIT/ML FLEXPEN: 7.0000 [IU] | PEN_INJECTOR | Freq: Three times a day (TID) | SUBCUTANEOUS | 5 refills | Status: DC

## 2017-02-25 ENCOUNTER — Emergency Department (HOSPITAL_COMMUNITY): Payer: PPO

## 2017-02-25 ENCOUNTER — Inpatient Hospital Stay (HOSPITAL_COMMUNITY)
Admission: EM | Admit: 2017-02-25 | Discharge: 2017-02-27 | DRG: 291 | Disposition: A | Discharge status: Home / Self Care | Payer: PPO | Attending: Internal Medicine | Admitting: Internal Medicine

## 2017-02-25 ENCOUNTER — Encounter (HOSPITAL_COMMUNITY): Payer: Self-pay

## 2017-02-25 DIAGNOSIS — E113593 Type 2 diabetes mellitus with proliferative diabetic retinopathy without macular edema, bilateral: Secondary | ICD-10-CM

## 2017-02-25 DIAGNOSIS — Z7902 Long term (current) use of antithrombotics/antiplatelets: Secondary | ICD-10-CM

## 2017-02-25 DIAGNOSIS — I5043 Acute on chronic combined systolic (congestive) and diastolic (congestive) heart failure: Secondary | ICD-10-CM | POA: Diagnosis present

## 2017-02-25 DIAGNOSIS — Z888 Allergy status to other drugs, medicaments and biological substances status: Secondary | ICD-10-CM | POA: Diagnosis not present

## 2017-02-25 DIAGNOSIS — E785 Hyperlipidemia, unspecified: Secondary | ICD-10-CM | POA: Diagnosis present

## 2017-02-25 DIAGNOSIS — E1122 Type 2 diabetes mellitus with diabetic chronic kidney disease: Secondary | ICD-10-CM | POA: Diagnosis present

## 2017-02-25 DIAGNOSIS — I252 Old myocardial infarction: Secondary | ICD-10-CM | POA: Diagnosis not present

## 2017-02-25 DIAGNOSIS — Z8673 Personal history of transient ischemic attack (TIA), and cerebral infarction without residual deficits: Secondary | ICD-10-CM | POA: Diagnosis not present

## 2017-02-25 DIAGNOSIS — I130 Hypertensive heart and chronic kidney disease with heart failure and stage 1 through stage 4 chronic kidney disease, or unspecified chronic kidney disease: Secondary | ICD-10-CM | POA: Diagnosis not present

## 2017-02-25 DIAGNOSIS — E11319 Type 2 diabetes mellitus with unspecified diabetic retinopathy without macular edema: Secondary | ICD-10-CM | POA: Diagnosis present

## 2017-02-25 DIAGNOSIS — I255 Ischemic cardiomyopathy: Secondary | ICD-10-CM | POA: Diagnosis present

## 2017-02-25 DIAGNOSIS — N185 Chronic kidney disease, stage 5: Secondary | ICD-10-CM | POA: Diagnosis present

## 2017-02-25 DIAGNOSIS — E1142 Type 2 diabetes mellitus with diabetic polyneuropathy: Secondary | ICD-10-CM | POA: Diagnosis present

## 2017-02-25 DIAGNOSIS — I132 Hypertensive heart and chronic kidney disease with heart failure and with stage 5 chronic kidney disease, or end stage renal disease: Principal | ICD-10-CM | POA: Diagnosis present

## 2017-02-25 DIAGNOSIS — Z91040 Latex allergy status: Secondary | ICD-10-CM | POA: Diagnosis not present

## 2017-02-25 DIAGNOSIS — Z79899 Other long term (current) drug therapy: Secondary | ICD-10-CM

## 2017-02-25 DIAGNOSIS — Z91048 Other nonmedicinal substance allergy status: Secondary | ICD-10-CM | POA: Diagnosis not present

## 2017-02-25 DIAGNOSIS — N184 Chronic kidney disease, stage 4 (severe): Secondary | ICD-10-CM | POA: Diagnosis present

## 2017-02-25 DIAGNOSIS — Z794 Long term (current) use of insulin: Secondary | ICD-10-CM | POA: Diagnosis not present

## 2017-02-25 DIAGNOSIS — I5023 Acute on chronic systolic (congestive) heart failure: Secondary | ICD-10-CM | POA: Diagnosis present

## 2017-02-25 DIAGNOSIS — I161 Hypertensive emergency: Secondary | ICD-10-CM

## 2017-02-25 DIAGNOSIS — R0602 Shortness of breath: Secondary | ICD-10-CM | POA: Diagnosis present

## 2017-02-25 LAB — BASIC METABOLIC PANEL
Anion gap: 9 (ref 5–15)
BUN: 58 mg/dL — AB (ref 6–20)
CO2: 23 mmol/L (ref 22–32)
CREATININE: 4.87 mg/dL — AB (ref 0.61–1.24)
Calcium: 7.9 mg/dL — ABNORMAL LOW (ref 8.9–10.3)
Chloride: 107 mmol/L (ref 101–111)
GFR calc Af Amer: 14 mL/min — ABNORMAL LOW (ref >60)
GFR calc non Af Amer: 12 mL/min — ABNORMAL LOW (ref >60)
Glucose, Bld: 74 mg/dL (ref 65–99)
POTASSIUM: 3.4 mmol/L — AB (ref 3.5–5.1)
Sodium: 139 mmol/L (ref 135–145)

## 2017-02-25 LAB — I-STAT TROPONIN, ED: Troponin i, poc: 0.08 ng/mL (ref 0.00–0.08)

## 2017-02-25 LAB — HEPATIC FUNCTION PANEL
ALBUMIN: 3.5 g/dL (ref 3.5–5.0)
ALT: 9 U/L — AB (ref 17–63)
AST: 13 U/L — ABNORMAL LOW (ref 15–41)
Alkaline Phosphatase: 62 U/L (ref 38–126)
BILIRUBIN TOTAL: 0.5 mg/dL (ref 0.3–1.2)
Bilirubin, Direct: <0.1 mg/dL — ABNORMAL LOW (ref 0.1–0.5)
Total Protein: 6.6 g/dL (ref 6.5–8.1)

## 2017-02-25 LAB — BRAIN NATRIURETIC PEPTIDE: B Natriuretic Peptide: 1101.4 pg/mL — ABNORMAL HIGH (ref 0.0–100.0)

## 2017-02-25 LAB — CBC
HEMATOCRIT: 28.9 % — AB (ref 39.0–52.0)
Hemoglobin: 9.3 g/dL — ABNORMAL LOW (ref 13.0–17.0)
MCH: 27.2 pg (ref 26.0–34.0)
MCHC: 32.2 g/dL (ref 30.0–36.0)
MCV: 84.5 fL (ref 78.0–100.0)
PLATELETS: 240 10*3/uL (ref 150–400)
RBC: 3.42 MIL/uL — AB (ref 4.22–5.81)
RDW: 12.9 % (ref 11.5–15.5)
WBC: 4.8 10*3/uL (ref 4.0–10.5)

## 2017-02-25 MED ORDER — ACETAMINOPHEN 325 MG PO TABS: 650.0000 mg | ORAL_TABLET | Freq: Four times a day (QID) | ORAL | Status: DC | PRN

## 2017-02-25 MED ORDER — CARVEDILOL 12.5 MG PO TABS
12.5000 mg | ORAL_TABLET | Freq: Two times a day (BID) | ORAL | Status: DC
  Administered 2017-02-26 – 2017-02-27 (×3): 12.5 mg via ORAL
  Filled 2017-02-25 (×4): qty 1

## 2017-02-25 MED ORDER — SODIUM CHLORIDE 0.9% FLUSH
3.0000 mL | Freq: Two times a day (BID) | INTRAVENOUS | Status: DC
  Administered 2017-02-25 – 2017-02-27 (×4): 3 mL via INTRAVENOUS

## 2017-02-25 MED ORDER — NITROGLYCERIN 2 % TD OINT
1.0000 [in_us] | TOPICAL_OINTMENT | Freq: Once | TRANSDERMAL | Status: AC
  Administered 2017-02-25: 1 [in_us] via TOPICAL
  Filled 2017-02-25: qty 1

## 2017-02-25 MED ORDER — INSULIN ASPART 100 UNIT/ML ~~LOC~~ SOLN
0.0000 [IU] | Freq: Three times a day (TID) | SUBCUTANEOUS | Status: DC
  Administered 2017-02-26: 1 [IU] via SUBCUTANEOUS

## 2017-02-25 MED ORDER — PRAVASTATIN SODIUM 40 MG PO TABS
40.0000 mg | ORAL_TABLET | Freq: Every day | ORAL | Status: DC
  Administered 2017-02-26: 40 mg via ORAL
  Filled 2017-02-25 (×2): qty 1

## 2017-02-25 MED ORDER — ENOXAPARIN SODIUM 30 MG/0.3ML ~~LOC~~ SOLN
30.0000 mg | SUBCUTANEOUS | Status: DC
  Filled 2017-02-25 (×2): qty 0.3

## 2017-02-25 MED ORDER — POTASSIUM CHLORIDE 20 MEQ/15ML (10%) PO SOLN
40.0000 meq | Freq: Once | ORAL | Status: AC
  Administered 2017-02-25: 40 meq via ORAL
  Filled 2017-02-25: qty 30

## 2017-02-25 MED ORDER — FUROSEMIDE 10 MG/ML IJ SOLN
40.0000 mg | Freq: Two times a day (BID) | INTRAMUSCULAR | Status: DC
  Administered 2017-02-25 – 2017-02-27 (×4): 40 mg via INTRAVENOUS
  Filled 2017-02-25 (×4): qty 4

## 2017-02-25 MED ORDER — INSULIN GLARGINE 100 UNIT/ML ~~LOC~~ SOLN
15.0000 [IU] | Freq: Every day | SUBCUTANEOUS | Status: DC
  Administered 2017-02-26: 15 [IU] via SUBCUTANEOUS
  Filled 2017-02-25 (×2): qty 0.15

## 2017-02-25 MED ORDER — HYDRALAZINE HCL 25 MG PO TABS
100.0000 mg | ORAL_TABLET | Freq: Once | ORAL | Status: AC
  Administered 2017-02-25: 100 mg via ORAL
  Filled 2017-02-25: qty 4

## 2017-02-25 MED ORDER — HYDRALAZINE HCL 20 MG/ML IJ SOLN
10.0000 mg | INTRAMUSCULAR | Status: DC | PRN
  Administered 2017-02-26: 10 mg via INTRAVENOUS
  Filled 2017-02-25: qty 1

## 2017-02-25 MED ORDER — NITROGLYCERIN 0.4 MG SL SUBL
0.4000 mg | SUBLINGUAL_TABLET | Freq: Once | SUBLINGUAL | Status: AC
  Administered 2017-02-25: 0.4 mg via SUBLINGUAL
  Filled 2017-02-25: qty 1

## 2017-02-25 MED ORDER — HYDRALAZINE HCL 50 MG PO TABS: 100.0000 mg | ORAL_TABLET | Freq: Three times a day (TID) | ORAL | Status: DC

## 2017-02-25 MED ORDER — GABAPENTIN 300 MG PO CAPS
300.0000 mg | ORAL_CAPSULE | Freq: Every day | ORAL | Status: DC
  Administered 2017-02-25 – 2017-02-26 (×2): 300 mg via ORAL
  Filled 2017-02-25 (×2): qty 1

## 2017-02-25 MED ORDER — NITROGLYCERIN 0.4 MG SL SUBL: 0.4000 mg | SUBLINGUAL_TABLET | Freq: Once | SUBLINGUAL | Status: DC

## 2017-02-25 MED ORDER — ACETAMINOPHEN 650 MG RE SUPP: 650.0000 mg | Freq: Four times a day (QID) | RECTAL | Status: DC | PRN

## 2017-02-25 MED ORDER — CLOPIDOGREL BISULFATE 75 MG PO TABS
75.0000 mg | ORAL_TABLET | Freq: Every day | ORAL | Status: DC
  Administered 2017-02-26 – 2017-02-27 (×2): 75 mg via ORAL
  Filled 2017-02-25 (×2): qty 1

## 2017-02-25 NOTE — ED Notes (Signed)
Admitting team at bedside.

## 2017-02-25 NOTE — ED Triage Notes (Signed)
Pt presents to the ed with complaints of shortness of breath x 3 days. Reports having congestive heart failure. Pts breathing is equal and unlabored at this time. Denies any swelling.

## 2017-02-25 NOTE — H&P (Signed)
Date: 02/25/2017               Patient Name:  Charles Hart MRN: [id number]  DOB: 05/05/1956 Age / Sex: 61 y.o., male   PCP: Lucious Groves, DO         Medical Service: Internal Medicine Teaching Service         Attending Physician: Dr. Bartholomew Crews, MD    First Contact: Dr. Tarri Abernethy  Pager: [phone]  Second Contact: Dr. Marlowe Sax Pager: [phone]       After Hours (After 5p/  First Contact Pager: [phone]  weekends / holidays): Second Contact Pager: [phone]   Chief Complaint: Shortness of breath  History of Present Illness: Charles Hart is a 61 yo M with a history of ischemic cardiomyopathy, MI, HFrEF (25-30%), CKD, DM, CVA who presented to the ED with SOB.   His SOB began three days when he noticed increased orthopnea at night (needs 3 pillows at baseline) and DOE. His sx have persisted and he has been experiencing dyspnea at rest today, also has slight increase in LE swelling. He reports adherence to his medications, no increase in salt or fluid intake, he has not checked his weight recently. Notes a slight cough, no production, no wheezing, feels it is due to allergies. Denies chest pain, fever, problems with urination. He has had a history of similar episodes with admission in the past.    In the ED, initial vitals HR 74, BP 179/100, RR 20, O2 97% on RA. Labs remarkable for Cr 4.87, Hgb 9.3, BNP 1,101.4, troponin negative. He received hydralazine and nitroglycerin to decrease BP. CXR showed cardiomegaly with vascular congestion. He was admitted to the IMTS for management of HF exacerbation.      Meds:  Current Meds  Medication Sig  . CARTIA XT 180 MG 24 hr capsule TAKE 1 CAPSULE(180 MG) BY MOUTH DAILY  . carvedilol (COREG) 25 MG tablet Take 25 mg by mouth 2 (two) times daily.  . clopidogrel (PLAVIX) 75 MG tablet Take 1 tablet (75 mg total) by mouth daily.  . furosemide (LASIX) 80 MG tablet Take 1 tablet (80 mg total) by mouth 2 (two) times daily.  [name] gabapentin  (NEURONTIN) 300 MG capsule Take 300 mg by mouth at bedtime  . hydrALAZINE (APRESOLINE) 100 MG tablet Take 1 tablet (100 mg total) by mouth 3 (three) times daily.  . insulin aspart (NOVOLOG FLEXPEN) 100 UNIT/ML FlexPen Inject 7 Units into the skin 3 (three) times daily with meals.  . Insulin Glargine (LANTUS SOLOSTAR) 100 UNIT/ML Solostar Pen Inject 30 Units into the skin at bedtime.  . Multiple Vitamins-Minerals (ONE-A-DAY MENS 50+ ADVANTAGE PO) Take 1 tablet by mouth daily with breakfast.  . pravastatin (PRAVACHOL) 40 MG tablet Take 1 tablet (40 mg total) by mouth daily.     Allergies: Allergies as of 02/25/2017 - Review Complete 02/25/2017  Allergen Reaction Noted  . Amlodipine Swelling 07/10/2013  . Ivp dye [iodinated diagnostic agents] Other (See Comments) 02/17/2016  . Lisinopril Cough 06/19/2013  . Red dye Other (See Comments) 02/17/2016   Past Medical History:  Diagnosis Date  . Adenomatous colon polyp 07/02/2011   Last colonoscopy May 06, 2011 by Dr. Owens Loffler, who recommended repeat colonoscopy in 5 years.   . Background diabetic retinopathy 04/20/2012   Patient is followed by Dr. Katy Fitch   . Cardiomyopathy    LV function improved from 2004 to 2008.  Historically, moderately dilated LV with EF 30-40% by  2D echo 08/14/2002.  Mild CAD with severe LV dysfunction by cardiac cath 09/2002.  Normal coronary arteries and normal LV function by cardiac cath 09/19/2006.  A 2-D echo on 04/01/2009 showed mild concentric hypertrophy and normal systolic (LVEF  79-02%) and doppler C/W with grade 1 diastolic dysfunction.  . CHF (congestive heart failure) ([place])    LV function improved from 2004 to 2008.  Historically, moderately dilated LV with EF 30-40% by 2D echo 08/14/2002.  Mild CAD with severe LV dysfunction by cardiac cath 09/2002.  Normal coronary arteries and normal LV function by cardiac cath 09/19/2006.  A 2-D echo on 04/01/2009 showed mild concentric hypertrophy and normal systolic (LVEF   40-97%) and doppler C/W with grade 1 diastolic dysfunction..   . Chronic combined systolic and diastolic congestive heart failure ([place]) 05/21/2010   LV function improved from 2004 to 2008.  Historically, moderately dilated LV with EF 30-40% by 2D echo 08/14/2002.  Mild CAD with severe LV dysfunction by cardiac cath 09/2002.  Normal coronary arteries and normal LV function by cardiac cath 09/19/2006.  A 2-D echo on 04/01/2009 showed mild concentric hypertrophy and normal systolic (LVEF  35-32%) and doppler parameters consistent with abnormal left   . CVA (cerebral vascular accident) ([place]) 07/04/2012   MRI of the brain 07/04/2012 showed an acute infarct in the right basal ganglia involving the anterior putamen, anterior limb internal capsule, and head of the caudate; this measured approximately 2.5 cm in diameter.     . Dermatitis   . Diabetes mellitus    type 2  . DIABETIC PERIPHERAL NEUROPATHY 08/03/2007   Qualifier: Diagnosis of  By: Marinda Elk MD, Sonia Side    . DM neuropathy, painful ([place])   . Hearing loss in right ear   . Hyperlipidemia   . Hypertension   . Hypertensive crisis 07/28/2012  . Hypertensive urgency 08/20/2014  . Myocardial infarction ([place])   . Nephrotic syndrome 02/18/2013   A 24-hour urine collection 03/04/2013 showed total protein of 5,460 g and creatinine clearance of 80 mL/minute.  Patient was seen by Seward Meth at [place] and a repeat 24-hour urine showed 10,407 mg protein.  Patient underwent kidney biopsy on 05/30/2013; pathology showed advanced diffuse and nodular diabetic nephropathy with vascular changes consistent with long-standing difficult to control hypertension.       Family History:  Family History  Problem Relation Age of Onset  . Aneurysm Father 27       died of rupture  . Colon cancer Sister      Social History:  Social History  Substance Use Topics  . Smoking status: Never Smoker  . Smokeless tobacco: Never Used  . Alcohol  use No     Comment: Wine rarely.     Review of Systems: A complete ROS was negative except as per HPI.  Physical Exam: Blood pressure (!) 178/96, pulse 81, temperature 98 F (36.7 C), temperature source Oral, resp. rate 20, height 6' (1.829 m), weight 220 lb (99.8 kg), SpO2 96 %. Physical Exam  Constitutional: He is oriented to person, place, and time. He appears well-developed and well-nourished.  HENT:  Head: Normocephalic and atraumatic.  Mouth/Throat: Oropharynx is clear and moist.  Eyes: Pupils are equal, round, and reactive to light. EOM are normal.  Neck:  Slight jugular distension with hepatojugular reflex   Cardiovascular: Normal rate, regular rhythm and intact distal pulses.  Exam reveals no gallop.   No murmur heard. Pulmonary/Chest:  Slightly increased respiratory effort, no  distress, no wheezes, no crackles appreciated   Abdominal: Soft. He exhibits no distension. There is no tenderness. There is no rebound and no guarding.  Musculoskeletal:  Minimal bilateral LE edema, 5/5 strength in all extremities   Neurological: He is alert and oriented to person, place, and time. No cranial nerve deficit or sensory deficit.  Skin: Skin is warm and dry. Capillary refill takes less than 2 seconds.  Psychiatric: He has a normal mood and affect.    EKG: personally reviewed my interpretation is normal rate, sinus rhythm, evidence of LV hypertrophy, no evidence of ischemia   CXR: personally reviewed my interpretation is cardiomegaly with prominent vascular markings.   Assessment & Plan by Problem: Active Problems:   Acute on chronic combined systolic and diastolic CHF (congestive heart failure) (HCC) CHF related to ischemic cardiomyopathy with recent echo (02/23/17) showing EF 25-30% presents with increasing orthopnea and dyspnea, elevated BNP consistent with acute HF exacerbation. He reports being adherent with his medication regimen. Management of his fluid status is complicated by  his CKD. His current Cr 4.87 (last 4.97, 3.17) which may represent his new baseline or improve with resolution of HF exacerbation.  Plan: -IV Lasix 40 mg, assess response -Continue home carvedilol 12.5 mg BID -Hydralazine 10 mg IV for sys BP>180, dia BP>110, consider switching to oral home regimen once better controlled -Continue home clopidogrel 75 mg daily  -Hold home diltiazem -Trend troponins -BMP, Mg  -Daily weights, strict I/Os, Fluid restriction     Chronic Kidney Disease, stage IV -Daily weights, strict I/Os, fluid restriction -Monitor electrolytes  -Renal/carb modified diet   Type 2 Diabetes Mellitus  -Lantus 15 U qhs, Novolog SSI   Dispo: Admit patient to Observation with expected length of stay less than 2 midnights.  Signed: Tawny Asal, MD 02/25/2017, 11:24 PM  Pager: [phone]

## 2017-02-25 NOTE — ED Provider Notes (Signed)
[facility] DEPT Provider Note   CSN: [id number] Arrival date & time: 02/25/17  1615     History   Chief Complaint Chief Complaint  Patient presents with  . Shortness of Breath    HPI Charles Hart is a 61 y.o. male.  The history is provided by the patient.  Shortness of Breath  This is a recurrent problem. Duration: 3 days. The problem occurs continuously.The problem has been gradually worsening. Associated symptoms include cough. Pertinent negatives include no fever, no sputum production and no leg swelling. Associated medical issues include heart failure (on 160mg  lasix daily) and past MI. Associated medical issues do not include PE or DVT.    Past Medical History:  Diagnosis Date  . Adenomatous colon polyp 07/02/2011   Last colonoscopy May 06, 2011 by Dr. Owens Loffler, who recommended repeat colonoscopy in 5 years.   . Background diabetic retinopathy 04/20/2012   Patient is followed by Dr. Katy Fitch   . Cardiomyopathy    LV function improved from 2004 to 2008.  Historically, moderately dilated LV with EF 30-40% by 2D echo 08/14/2002.  Mild CAD with severe LV dysfunction by cardiac cath 09/2002.  Normal coronary arteries and normal LV function by cardiac cath 09/19/2006.  A 2-D echo on 04/01/2009 showed mild concentric hypertrophy and normal systolic (LVEF  53-61%) and doppler C/W with grade 1 diastolic dysfunction.  . CHF (congestive heart failure) ([place])    LV function improved from 2004 to 2008.  Historically, moderately dilated LV with EF 30-40% by 2D echo 08/14/2002.  Mild CAD with severe LV dysfunction by cardiac cath 09/2002.  Normal coronary arteries and normal LV function by cardiac cath 09/19/2006.  A 2-D echo on 04/01/2009 showed mild concentric hypertrophy and normal systolic (LVEF  44-31%) and doppler C/W with grade 1 diastolic dysfunction..   . Chronic combined systolic and diastolic congestive heart failure ([place]) 05/21/2010   LV function improved from 2004 to 2008.   Historically, moderately dilated LV with EF 30-40% by 2D echo 08/14/2002.  Mild CAD with severe LV dysfunction by cardiac cath 09/2002.  Normal coronary arteries and normal LV function by cardiac cath 09/19/2006.  A 2-D echo on 04/01/2009 showed mild concentric hypertrophy and normal systolic (LVEF  54-00%) and doppler parameters consistent with abnormal left   . CVA (cerebral vascular accident) ([place]) 07/04/2012   MRI of the brain 07/04/2012 showed an acute infarct in the right basal ganglia involving the anterior putamen, anterior limb internal capsule, and head of the caudate; this measured approximately 2.5 cm in diameter.     . Dermatitis   . Diabetes mellitus    type 2  . DIABETIC PERIPHERAL NEUROPATHY 08/03/2007   Qualifier: Diagnosis of  By: Marinda Elk MD, Sonia Side    . DM neuropathy, painful ([place])   . Hearing loss in right ear   . Hyperlipidemia   . Hypertension   . Hypertensive crisis 07/28/2012  . Hypertensive urgency 08/20/2014  . Myocardial infarction ([place])   . Nephrotic syndrome 02/18/2013   A 24-hour urine collection 03/04/2013 showed total protein of 5,460 g and creatinine clearance of 80 mL/minute.  Patient was seen by Seward Meth at [facility] and a repeat 24-hour urine showed 10,407 mg protein.  Patient underwent kidney biopsy on 05/30/2013; pathology showed advanced diffuse and nodular diabetic nephropathy with vascular changes consistent with long-standing difficult to control hypertension.       Patient Active Problem List   Diagnosis Date Noted  . Piriformis  syndrome of right side 03/03/2016  . Muscle spasm of left shoulder area 03/03/2016  . Secondary hyperparathyroidism, renal ([place]) 02/09/2016  . Primary osteoarthritis of left knee 01/07/2016  . Low back pain 01/07/2016  . Joint stiffness 11/13/2015  . Age-related nuclear cataract of both eyes 10/07/2015  . Normocytic anemia 08/22/2014  . CKD stage 4 due to type 2 diabetes mellitus ([place])  06/03/2014  . Health care maintenance 05/16/2014  . Nephrotic syndrome 02/18/2013  . Obstructive sleep apnea 07/26/2012  . History of stroke 07/04/2012  . Controlled type 2 diabetes mellitus with both eyes affected by proliferative retinopathy without macular edema, with long-term current use of insulin ([place]) 04/20/2012  . ED (erectile dysfunction) 04/18/2012  . Adenomatous colon polyp 07/02/2011  . Chronic combined systolic and diastolic congestive heart failure ([place]) 05/21/2010  . CAROTID BRUIT, RIGHT 04/01/2009  . Diabetic peripheral neuropathy associated with type 2 diabetes mellitus ([place]) 08/03/2007  . HEARING LOSS, RIGHT EAR 07/30/2007  . DM (diabetes mellitus), type 2 with renal complications ([place]) 16/05/9603  . Hyperlipidemia 06/01/2006  . Essential hypertension 06/01/2006    Past Surgical History:  Procedure Laterality Date  . CARDIAC CATHETERIZATION     3 times  . COLONOSCOPY    . FOOT SURGERY    . POLYPECTOMY         Home Medications    Prior to Admission medications   Medication Sig Start Date End Date Taking? Authorizing Provider  CARTIA XT 180 MG 24 hr capsule TAKE 1 CAPSULE(180 MG) BY MOUTH DAILY 11/16/16  Yes Joni Reining C, DO  carvedilol (COREG) 25 MG tablet Take 25 mg by mouth 2 (two) times daily. 02/22/17  Yes [provider]  clopidogrel (PLAVIX) 75 MG tablet Take 1 tablet (75 mg total) by mouth daily. 01/17/17  Yes Lucious Groves, DO  furosemide (LASIX) 80 MG tablet Take 1 tablet (80 mg total) by mouth 2 (two) times daily. 01/17/17  Yes Lucious Groves, DO  gabapentin (NEURONTIN) 300 MG capsule Take 300 mg by mouth at bedtime 11/16/16  Yes Lucious Groves, DO  hydrALAZINE (APRESOLINE) 100 MG tablet Take 1 tablet (100 mg total) by mouth 3 (three) times daily. 11/16/16 11/25/17 Yes Lucious Groves, DO  insulin aspart (NOVOLOG FLEXPEN) 100 UNIT/ML FlexPen Inject 7 Units into the skin 3 (three) times daily with meals. 02/24/17  Yes Lucious Groves, DO  Insulin  Glargine (LANTUS SOLOSTAR) 100 UNIT/ML Solostar Pen Inject 30 Units into the skin at bedtime. 08/16/16  Yes Lucious Groves, DO  Multiple Vitamins-Minerals (ONE-A-DAY MENS 50+ ADVANTAGE PO) Take 1 tablet by mouth daily with breakfast.   Yes [provider]  pravastatin (PRAVACHOL) 40 MG tablet Take 1 tablet (40 mg total) by mouth daily. 01/17/17  Yes Lucious Groves, DO  carvedilol (COREG) 12.5 MG tablet Take 1 tablet (12.5 mg total) by mouth 2 (two) times daily. Patient not taking: Reported on 02/25/2017 08/16/16   Lucious Groves, DO  sildenafil (VIAGRA) 50 MG tablet Take 2 tablets (100 mg total) by mouth as needed for erectile dysfunction. Patient not taking: Reported on 02/25/2017 09/15/16 09/15/17  Lucious Groves, DO    Family History Family History  Problem Relation Age of Onset  . Aneurysm Father 45       died of rupture  . Colon cancer Sister     Social History Social History  Substance Use Topics  . Smoking status: Never Smoker  . Smokeless tobacco: Never Used  .  Alcohol use No     Comment: Wine rarely.     Allergies   Amlodipine; Ivp dye [iodinated diagnostic agents]; Lisinopril; and Red dye   Review of Systems Review of Systems  Constitutional: Negative for fever.  Respiratory: Positive for cough and shortness of breath. Negative for sputum production.   Cardiovascular: Negative for leg swelling.  All other systems are reviewed and are negative for acute change except as noted in the HPI    Physical Exam Updated Vital Signs BP (!) 190/98   Pulse 81   Temp 98 F (36.7 C) (Oral)   Resp (!) 24   SpO2 94%   Physical Exam  Constitutional: He is oriented to person, place, and time. He appears well-developed and well-nourished. No distress.  HENT:  Head: Normocephalic and atraumatic.  Nose: Nose normal.  Eyes: Pupils are equal, round, and reactive to light. Conjunctivae and EOM are normal. Right eye exhibits no discharge. Left eye exhibits no discharge. No  scleral icterus.  Neck: Normal range of motion. Neck supple. JVD present.  Cardiovascular: Normal rate and regular rhythm.  Exam reveals no gallop and no friction rub.   No murmur heard. Pulmonary/Chest: Effort normal and breath sounds normal. No stridor. No respiratory distress. He has no rales.  Abdominal: Soft. He exhibits no distension. There is no tenderness.  Musculoskeletal: He exhibits no edema or tenderness.  Mild 1+ BLE pitting edema   Neurological: He is alert and oriented to person, place, and time.  Skin: Skin is warm and dry. No rash noted. He is not diaphoretic. No erythema.  Psychiatric: He has a normal mood and affect.  Vitals reviewed.    ED Treatments / Results  Labs (all labs ordered are listed, but only abnormal results are displayed) Labs Reviewed  BASIC METABOLIC PANEL - Abnormal; Notable for the following:       Result Value   Potassium 3.4 (*)    BUN 58 (*)    Creatinine, Ser 4.87 (*)    Calcium 7.9 (*)    GFR calc non Af Amer 12 (*)    GFR calc Af Amer 14 (*)    All other components within normal limits  CBC - Abnormal; Notable for the following:    RBC 3.42 (*)    Hemoglobin 9.3 (*)    HCT 28.9 (*)    All other components within normal limits  HEPATIC FUNCTION PANEL - Abnormal; Notable for the following:    AST 13 (*)    ALT 9 (*)    Bilirubin, Direct <0.1 (*)    All other components within normal limits  BRAIN NATRIURETIC PEPTIDE - Abnormal; Notable for the following:    B Natriuretic Peptide 1,101.4 (*)    All other components within normal limits  I-STAT TROPOININ, ED    EKG  EKG Interpretation  Date/Time:  Saturday February 25 2017 16:19:45 EDT Ventricular Rate:  75 PR Interval:  158 QRS Duration: 110 QT Interval:  442 QTC Calculation: 493 R Axis:   17 Text Interpretation:  Normal sinus rhythm Possible Left atrial enlargement Left ventricular hypertrophy with repolarization abnormality Prolonged QT Abnormal ECG Reconfirmed by Addison Lank [phone]) on 02/25/2017 6:47:44 PM       Radiology Dg Chest 2 View  Result Date: 02/25/2017 CLINICAL DATA:  Shortness of Breath EXAM: CHEST  2 VIEW COMPARISON:  02/17/2016 FINDINGS: Cardiomegaly with vascular congestion. No confluent opacities, effusions or edema. No acute bony abnormality. IMPRESSION: Cardiomegaly, vascular congestion. Electronically Signed  By: Rolm Baptise M.D.   On: 02/25/2017 16:53    Procedures Procedures (including critical care time) CRITICAL CARE Performed by: Grayce Sessions Sequoia Mincey Total critical care time: 35 minutes Critical care time was exclusive of separately billable procedures and treating other patients. Critical care was necessary to treat or prevent imminent or life-threatening deterioration. Critical care was time spent personally by me on the following activities: development of treatment plan with patient and/or surrogate as well as nursing, discussions with consultants, evaluation of patient's response to treatment, examination of patient, obtaining history from patient or surrogate, ordering and performing treatments and interventions, ordering and review of laboratory studies, ordering and review of radiographic studies, pulse oximetry and re-evaluation of patient's condition.   Medications Ordered in ED Medications  hydrALAZINE (APRESOLINE) tablet 100 mg (100 mg Oral Given 02/25/17 1924)  nitroGLYCERIN (NITROGLYN) 2 % ointment 1 inch (1 inch Topical Given 02/25/17 2137)  nitroGLYCERIN (NITROSTAT) SL tablet 0.4 mg (0.4 mg Sublingual Given 02/25/17 2137)     Initial Impression / Assessment and Plan / ED Course  I have reviewed the triage vital signs and the nursing notes.  Pertinent labs & imaging results that were available during my care of the patient were reviewed by me and considered in my medical decision making (see chart for details).     Workup consistent with CHF exacerbation in the setting of hypertensive emergency. No chest  pain and troponin negative. EKG without acute ischemic changes. Chest x-ray with cardiomegaly and pulmonary vascular congestion however no overt edema. Patient is satting well on room air. Patient given hydralazine and nitroglycerin, resulting in improving blood pressure. Given patient's renal insufficiency will hold on Lasix at this time and defer to inpatient team.  Case discussed with internal medicine who will admit the patient for further management.  Final Clinical Impressions(s) / ED Diagnoses   Final diagnoses:  Hypertensive emergency  Acute on chronic systolic congestive heart failure (HCC)      Pattiann Solanki, Grayce Sessions, MD 02/25/17 2201

## 2017-02-26 ENCOUNTER — Encounter (HOSPITAL_COMMUNITY): Payer: Self-pay | Admitting: *Deleted

## 2017-02-26 ENCOUNTER — Other Ambulatory Visit: Payer: Self-pay

## 2017-02-26 DIAGNOSIS — I130 Hypertensive heart and chronic kidney disease with heart failure and stage 1 through stage 4 chronic kidney disease, or unspecified chronic kidney disease: Secondary | ICD-10-CM

## 2017-02-26 LAB — GLUCOSE, CAPILLARY
GLUCOSE-CAPILLARY: 104 mg/dL — AB (ref 65–99)
GLUCOSE-CAPILLARY: 94 mg/dL (ref 65–99)
GLUCOSE-CAPILLARY: 123 mg/dL — AB (ref 65–99)
Glucose-Capillary: 152 mg/dL — ABNORMAL HIGH (ref 65–99)
Glucose-Capillary: 88 mg/dL (ref 65–99)

## 2017-02-26 LAB — TROPONIN I
TROPONIN I: 0.07 ng/mL — AB (ref <0.03)
Troponin I: 0.07 ng/mL (ref <0.03)
Troponin I: 0.06 ng/mL (ref <0.03)

## 2017-02-26 LAB — BASIC METABOLIC PANEL
ANION GAP: 7 (ref 5–15)
BUN: 57 mg/dL — ABNORMAL HIGH (ref 6–20)
CALCIUM: 7.8 mg/dL — AB (ref 8.9–10.3)
CO2: 24 mmol/L (ref 22–32)
CREATININE: 4.57 mg/dL — AB (ref 0.61–1.24)
Chloride: 108 mmol/L (ref 101–111)
GFR, EST AFRICAN AMERICAN: 15 mL/min — AB (ref >60)
GFR, EST NON AFRICAN AMERICAN: 13 mL/min — AB (ref >60)
GLUCOSE: 94 mg/dL (ref 65–99)
Potassium: 3.3 mmol/L — ABNORMAL LOW (ref 3.5–5.1)
Sodium: 139 mmol/L (ref 135–145)

## 2017-02-26 LAB — CBC
HEMATOCRIT: 29 % — AB (ref 39.0–52.0)
Hemoglobin: 9.4 g/dL — ABNORMAL LOW (ref 13.0–17.0)
MCH: 27.3 pg (ref 26.0–34.0)
MCHC: 32.4 g/dL (ref 30.0–36.0)
MCV: 84.3 fL (ref 78.0–100.0)
PLATELETS: 243 10*3/uL (ref 150–400)
RBC: 3.44 MIL/uL — ABNORMAL LOW (ref 4.22–5.81)
RDW: 13 % (ref 11.5–15.5)
WBC: 5.6 10*3/uL (ref 4.0–10.5)

## 2017-02-26 LAB — MAGNESIUM: MAGNESIUM: 1.6 mg/dL — AB (ref 1.7–2.4)

## 2017-02-26 MED ORDER — DILTIAZEM HCL ER COATED BEADS 180 MG PO CP24
180.0000 mg | ORAL_CAPSULE | Freq: Every day | ORAL | Status: DC
  Administered 2017-02-26 – 2017-02-27 (×2): 180 mg via ORAL
  Filled 2017-02-26 (×2): qty 1

## 2017-02-26 MED ORDER — MAGNESIUM OXIDE 400 (241.3 MG) MG PO TABS
800.0000 mg | ORAL_TABLET | Freq: Two times a day (BID) | ORAL | Status: AC
  Administered 2017-02-26 (×2): 800 mg via ORAL
  Filled 2017-02-26 (×2): qty 2

## 2017-02-26 MED ORDER — POLYVINYL ALCOHOL 1.4 % OP SOLN
2.0000 [drp] | OPHTHALMIC | Status: DC | PRN
  Administered 2017-02-26: 2 [drp] via OPHTHALMIC
  Filled 2017-02-26: qty 15

## 2017-02-26 MED ORDER — POTASSIUM CHLORIDE CRYS ER 20 MEQ PO TBCR
30.0000 meq | EXTENDED_RELEASE_TABLET | Freq: Once | ORAL | Status: AC
  Administered 2017-02-26: 30 meq via ORAL
  Filled 2017-02-26: qty 1

## 2017-02-26 NOTE — Progress Notes (Signed)
   02/26/17 2122  Vitals  Temp 98.4 F (36.9 C)  Temp Source Oral  BP (!) 171/90  BP Location Left Arm  BP Method Automatic  Patient Position (if appropriate) Lying  Pulse Rate 68  Pulse Rate Source Dinamap  Resp 18  Oxygen Therapy  SpO2 100 %  O2 Device Room Air  pt's BP elevated, pt resting in bed. Pt on several BP meds at home that is not ordered here. IMTS on call notified, awaiting call back.

## 2017-02-26 NOTE — Progress Notes (Signed)
Living with heart failure booklet provided to pt and teaching provided at bedside   [place]

## 2017-02-26 NOTE — Progress Notes (Signed)
Pt arrived to the unit at 0100 am. A/O x4, SOB on exertion. Denies any kind of pain. BP trending down. MD was notified of elevated troponin, no new orders received. Will continue monitoring.

## 2017-02-26 NOTE — Progress Notes (Signed)
IMTS on call paged again for pt's elevated BP, MD on call returned page, no new orders made. Will continue to monitor.

## 2017-02-26 NOTE — Progress Notes (Signed)
   Subjective: Charles Hart is doing well this AM. His breathing is much improved and returned to baseline over the interval. He continues to have good urine output. He takes his medications daily, watches his salt intake but does not follow a fluid restriction. He does not acknowledge any recent fevers, chills, chest pain, diarrhea, N/V, or any other factors that may have contributed to this exacerbation.   Objective: Vital signs in last 24 hours: Vitals:   02/26/17 0030 02/26/17 0107 02/26/17 0530 02/26/17 0900  BP: (!) 177/107 (!) 173/92 (!) 176/86 (!) 181/79  Pulse: 78 88 72 80  Resp: 18 20  20   Temp:  98.7 F (37.1 C) 98.3 F (36.8 C) 98.5 F (36.9 C)  TempSrc:  Oral Oral Oral  SpO2: 97% 99% 98% 95%  Weight:  205 lb 0.4 oz (93 kg)    Height:  6' (1.829 m)     Physical Exam  Constitutional: He is oriented to person, place, and time. He appears well-developed and well-nourished. No distress.  HENT:  Head: Normocephalic and atraumatic.  Eyes: Pupils are equal, round, and reactive to light. Conjunctivae are normal.  Cardiovascular: Normal rate, regular rhythm, normal heart sounds and intact distal pulses.  Exam reveals no friction rub.   No murmur heard. + Hepatojugular reflex  Pulmonary/Chest: Effort normal.  Crackles in the right lower lung fields.   Abdominal: Soft. Bowel sounds are normal. There is no tenderness. There is no guarding.  Musculoskeletal: He exhibits edema (Right leg trace edema > left leg). He exhibits no tenderness.  Neurological: He is alert and oriented to person, place, and time.  Skin:  Darker, dry patch on the right lower extremity    Assessment/Plan:  Active Problems:   Acute on chronic combined systolic and diastolic CHF (congestive heart failure) ([place])  1. Acute on Chronic HFrEF - Last echo on 02/23/17 EF of 25-30% - Troponin: 0.08 -> 0.07 -> 0.07 - Repeat ECG sinus rhythm with LEA and LVH, no ischemic changes  - Continue home carvedilol 12.5 mg  BID - Net fluid: -1850 mL over the interval, continue fluid restriction of 1560mL - Symptoms improved over the interval, continue diuresis with Lasix 40 mg IV BID - Mg 1.6 will replace  - Will restart home diltiazem   2. CKD Stage IV  - Denies uremic symptoms, K 3.3, Hgb 9.4, Ca 7.8  - Replacing K and Mg - Renal panel tomorrow AM - Follows with outpatient nephrology   3. Type 2 Diabtetes  - Lantus 15 units HS + meal time SSI  Dispo: Anticipated discharge in approximately 1 day(s).   Ina Homes, MD 02/26/2017, 9:36 AM My Pager: [phone]

## 2017-02-26 NOTE — Progress Notes (Signed)
Talked to phlembotomy, lab at bedside now collecting troponin   Charles Hart

## 2017-02-26 NOTE — Progress Notes (Signed)
Called phlebotomy regarding pt scheduled troponin. No answer will try again later   Charles Hart Leory Plowman

## 2017-02-27 ENCOUNTER — Other Ambulatory Visit: Payer: Self-pay

## 2017-02-27 ENCOUNTER — Telehealth: Payer: Self-pay

## 2017-02-27 DIAGNOSIS — Z91048 Other nonmedicinal substance allergy status: Secondary | ICD-10-CM

## 2017-02-27 DIAGNOSIS — Z794 Long term (current) use of insulin: Secondary | ICD-10-CM

## 2017-02-27 DIAGNOSIS — Z888 Allergy status to other drugs, medicaments and biological substances status: Secondary | ICD-10-CM

## 2017-02-27 DIAGNOSIS — N184 Chronic kidney disease, stage 4 (severe): Secondary | ICD-10-CM

## 2017-02-27 DIAGNOSIS — I5043 Acute on chronic combined systolic (congestive) and diastolic (congestive) heart failure: Secondary | ICD-10-CM

## 2017-02-27 DIAGNOSIS — Z91040 Latex allergy status: Secondary | ICD-10-CM

## 2017-02-27 DIAGNOSIS — E1122 Type 2 diabetes mellitus with diabetic chronic kidney disease: Secondary | ICD-10-CM

## 2017-02-27 DIAGNOSIS — Z79899 Other long term (current) drug therapy: Secondary | ICD-10-CM

## 2017-02-27 LAB — RENAL FUNCTION PANEL
ANION GAP: 11 (ref 5–15)
Albumin: 3.4 g/dL — ABNORMAL LOW (ref 3.5–5.0)
BUN: 66 mg/dL — ABNORMAL HIGH (ref 6–20)
CALCIUM: 7.8 mg/dL — AB (ref 8.9–10.3)
CO2: 25 mmol/L (ref 22–32)
Chloride: 103 mmol/L (ref 101–111)
Creatinine, Ser: 4.72 mg/dL — ABNORMAL HIGH (ref 0.61–1.24)
GFR calc Af Amer: 14 mL/min — ABNORMAL LOW (ref >60)
GFR calc non Af Amer: 12 mL/min — ABNORMAL LOW (ref >60)
Glucose, Bld: 84 mg/dL (ref 65–99)
Phosphorus: 4.9 mg/dL — ABNORMAL HIGH (ref 2.5–4.6)
Potassium: 3.3 mmol/L — ABNORMAL LOW (ref 3.5–5.1)
SODIUM: 139 mmol/L (ref 135–145)

## 2017-02-27 LAB — GLUCOSE, CAPILLARY
Glucose-Capillary: 89 mg/dL (ref 65–99)
Glucose-Capillary: 79 mg/dL (ref 65–99)

## 2017-02-27 LAB — HIV ANTIBODY (ROUTINE TESTING W REFLEX): HIV Screen 4th Generation wRfx: NONREACTIVE

## 2017-02-27 LAB — TROPONIN I: Troponin I: 0.05 ng/mL (ref <0.03)

## 2017-02-27 MED ORDER — FUROSEMIDE 80 MG PO TABS: 80.0000 mg | ORAL_TABLET | Freq: Two times a day (BID) | ORAL | Status: DC

## 2017-02-27 MED ORDER — HYDRALAZINE HCL 50 MG PO TABS: 100.0000 mg | ORAL_TABLET | Freq: Three times a day (TID) | ORAL | Status: DC

## 2017-02-27 MED ORDER — POTASSIUM CHLORIDE CRYS ER 10 MEQ PO TBCR
30.0000 meq | EXTENDED_RELEASE_TABLET | Freq: Once | ORAL | Status: AC
  Administered 2017-02-27: 30 meq via ORAL
  Filled 2017-02-27: qty 1

## 2017-02-27 MED ORDER — HYDRALAZINE HCL 50 MG PO TABS
50.0000 mg | ORAL_TABLET | Freq: Three times a day (TID) | ORAL | Status: DC
  Administered 2017-02-27: 50 mg via ORAL
  Filled 2017-02-27: qty 1

## 2017-02-27 NOTE — Progress Notes (Signed)
   Subjective: Charles Hart is doing well this AM. Breathing is much improved over the interval. He does not follow a fluid restriction at home, drinking about 1 gallon of tea/water per day. Also voices concerns about paying for his medication to [place]. Denies chest pain (at rest and with exertion), soa, palpitations, diaphoresis, arm or jaw pain. Discussed the plan for another ECG this AM due to some ST elevation on telemetry monitor overnight. If normal will discharge home.   Objective: Vital signs in last 24 hours: Vitals:   02/27/17 0017 02/27/17 0409 02/27/17 0416 02/27/17 0500  BP: (!) 151/88 (!) 165/69 (!) 177/89   Pulse: 63 65    Resp:      Temp: 97.8 F (36.6 C) 97.7 F (36.5 C)    TempSrc: Oral Oral    SpO2: 100% 100%    Weight:    212 lb 11.2 oz (96.5 kg)  Height:       Physical Exam  Constitutional: He is oriented to person, place, and time. He appears well-developed and well-nourished.  HENT:  Head: Normocephalic and atraumatic.  Eyes: Pupils are equal, round, and reactive to light. Conjunctivae are normal.  Cardiovascular: Normal rate, regular rhythm, normal heart sounds and intact distal pulses.  Exam reveals no friction rub.   No murmur heard. Pulmonary/Chest: Effort normal and breath sounds normal. He has no wheezes. He has no rales.  Abdominal: Soft. Bowel sounds are normal. There is no tenderness. There is no guarding.  Musculoskeletal: He exhibits no edema.  Neurological: He is alert and oriented to person, place, and time.  Skin: Skin is warm and dry.   Assessment/Plan:  Active Problems:   Acute on chronic combined systolic and diastolic CHF (congestive heart failure) ([place])  1. Acute on Chronic HFrEF - Last echo on 02/23/17 EF of 25-30% - Troponin: 0.08 -> 0.07 -> 0.07 - Repeat ECG this AM  - Continue home carvedilol 12.5 mg BID, Diltiazem 180 mg QD, Lasix 80 mg BID - Hydralazine restarted at 50 mg TID, worry that he may not be taking all his BP medications at  home due to cost.  - Net fluid: - 2,190 mL over the interval, continue fluid restriction of 1566mL - Symptoms improved over the interval, will switch to home medications  - Discussed need for fluid restriction at home, case management consulted for help obtaining outpatient medications.  2. CKD Stage IV  - Denies uremic symptoms, K 3.3, Hgb 9.4, Ca 7.8, PO4 4.9, Mg 1.6 - Replacing K and Mg - Follows with outpatient nephrology   3. Type 2 Diabtetes  - Lantus 15 units HS + meal time SSI  Dispo: Anticipated discharge in approximately 0 day(s).   Ina Homes, MD 02/27/2017, 8:21 AM Pager: My Pager: [phone]

## 2017-02-27 NOTE — Telephone Encounter (Signed)
Hospital TOC per Dr Marlowe Sax, discharge 02/27/2017, appt 03/06/2017.

## 2017-02-27 NOTE — Discharge Summary (Signed)
Name: Charles Hart MRN: [id number] DOB: 1956-02-17 61 y.o. PCP: Lucious Groves, DO  Date of Admission: 02/25/2017  6:17 PM Date of Discharge: 02/27/2017 Attending Physician: Aldine Contes, MD  Discharge Diagnosis: 1. HFrEF, Acute exacerbation  2. CKD Stage IV  3. Type 2 Diabetes   Active Problems:   CKD stage 4 due to type 2 diabetes mellitus (HCC)   Acute on chronic combined systolic and diastolic CHF (congestive heart failure) ([place])  Discharge Medications: Allergies as of 02/27/2017      Reactions   Amlodipine Swelling   Ivp Dye [iodinated Diagnostic Agents] Other (See Comments)   Per patient's Nephrologist, he doesn't want the patient exposed to ANY dye because of issues with his kidneys   Lisinopril Cough   Red Dye Other (See Comments)   NO dye of any kind (issues with his kidneys)      Medication List    TAKE these medications   CARTIA XT 180 MG 24 hr capsule Generic drug:  diltiazem TAKE 1 CAPSULE(180 MG) BY MOUTH DAILY   carvedilol 12.5 MG tablet Commonly known as:  COREG Take 1 tablet (12.5 mg total) by mouth 2 (two) times daily. What changed:  Another medication with the same name was removed. Continue taking this medication, and follow the directions you see here.   clopidogrel 75 MG tablet Commonly known as:  PLAVIX Take 1 tablet (75 mg total) by mouth daily.   furosemide 80 MG tablet Commonly known as:  LASIX Take 1 tablet (80 mg total) by mouth 2 (two) times daily.   gabapentin 300 MG capsule Commonly known as:  NEURONTIN Take 300 mg by mouth at bedtime   hydrALAZINE 100 MG tablet Commonly known as:  APRESOLINE Take 1 tablet (100 mg total) by mouth 3 (three) times daily.   insulin aspart 100 UNIT/ML FlexPen Commonly known as:  NOVOLOG FLEXPEN Inject 7 Units into the skin 3 (three) times daily with meals.   Insulin Glargine 100 UNIT/ML Solostar Pen Commonly known as:  LANTUS SOLOSTAR Inject 30 Units into the skin at bedtime.     ONE-A-DAY MENS 50+ ADVANTAGE PO Take 1 tablet by mouth daily with breakfast.   pravastatin 40 MG tablet Commonly known as:  PRAVACHOL Take 1 tablet (40 mg total) by mouth daily.   sildenafil 50 MG tablet Commonly known as:  VIAGRA Take 2 tablets (100 mg total) by mouth as needed for erectile dysfunction.      Disposition and follow-up:   Charles Hart was discharged from [facility] in Good condition.  At the hospital follow up visit please address:  1.   -- Have you limited your fluid intake? -- Are you having trouble affording your medications? -- Have you been able to take all your medications?  2.  Labs / imaging needed at time of follow-up: N/A  3.  Pending labs/ test needing follow-up: N/A  Follow-up Appointments: Follow-up Information    Lucious Groves, DO. Go on 03/06/2017.   Specialty:  Internal Medicine Why:  Appointment at 9:45 am.  Contact information: [place] [address] [phone]        Josue Hector, MD. Go on 03/22/2017.   Specialty:  Cardiology Why:  Appointment at 11:15 am.  Contact information: [address] [address] [place] Hospital Course by problem list: Active Problems:   CKD stage 4 due  to type 2 diabetes mellitus (HCC)   Acute on chronic combined systolic and diastolic CHF (congestive heart failure) (HCC)   1. HFrEF, Acute exacerbation. Mr. Leino presented to the ED on 02/26/2016 with progressive DOE of three days duration. Initial work-up in the ED was remarkable for a BNP >1,000 and cardiomegaly with vascular congestion on CXR. He has been compliant with his medications but does not follow a fluid restriction. He drinks a gallon of tea/water per day. ECG unchanged from prior and troponin x 4 were at baseline of 0.07. He diuresed well with IV Lasix 40 mg BID (net: - 5613mL). Home medications were resumed, symptoms improved significantly.  Discussed the need to decrease fluid intake. Follow-up with Dr. Heber [place] and Dr. [name] as an outpatient. Resume home BP medications on discharge including, Carvedilol 12.5 mg BID, Hydralazine 100 mg TID, Diltiazem 180 mg QD, and Lasix 80 mg BID.     2. CKD Stage IV. Throughout Mr. Radigan hospitalization his creatinine remained at or near his baseline of 4.9. Renal panel monitored throughout stay, replaced K and Mg as needed. No acute indications for dialysis. Not endorsing uremic symptoms. Will follow-up with outpatient nephrologist.   3. Type 2 Diabetes. Well controlled during the hospitalization with Lantus 15 units HS and SSI. Resumed home medication on discharge.    Discharge Vitals:   BP (!) 156/87   Pulse 65   Temp 97.7 F (36.5 C) (Oral)   Resp 18   Ht 6' (1.829 m)   Wt 212 lb 11.2 oz (96.5 kg)   SpO2 100%   BMI 28.85 kg/m   Pertinent Labs, Studies, and Procedures:  Chest X-Ray  IMPRESSION: Cardiomegaly, vascular congestion.  Discharge Instructions: Discharge Instructions    AMB Referral to [place] Management    Complete by:  As directed    Reason for consult:  HF exacerbation, multiple social issues, needs for follow up; Medication management follow up   Diagnoses of:  Heart Failure   Expected date of contact:  1-3 days (reserved for hospital discharges)   [employer] member:  Please assign patient for [facility] with [place] Coordinator. Assign, patient to [place] for resources and housing needs, recently separated - needs to reapply for resources [place] for medications assistance with management and affordability needs. Natividad Brood, RN BSN [facility] Liaison  [phone] business mobile phone Toll free office [phone]   Diet - low sodium heart healthy    Complete by:  As directed    Increase activity slowly    Complete by:  As directed      Signed: Ina Homes, MD 02/27/2017, 1:07 PM   Pager: My  Pager: [phone]

## 2017-02-27 NOTE — Progress Notes (Signed)
Internal Medicine Attending:   I saw and examined the patient. I reviewed the resident's note and I agree with the resident's findings and plan as documented in the resident's note.  Patient feels well this morning with no new complaints. He was initially admitted for worsening shortness of breath and was found to have acute on chronic systolic heart failure exacerbation. On exam today patient noted to have lungs which were clear to auscultation bilaterally and no peripheral edema was noted. We will continue with his home medications for now. Patient has diuresed approximately 2.1 L since admission and is much improved. We will transition him to oral Lasix today. I feel that he would benefit from being on a nitrate at home in addition to his hydralazine but this is not possible as he takes sildenafil for erectile dysfunction. Would consider adding spironolactone to his heart failure regimen. Patient is not on an ARB secondary to his poor renal function. We will have him follow-up with his cardiologist as an outpatient. Of note, patient was noted to have some EKG abnormalities on telemetry overnight (possible ST changes). Repeat EKG done today is similar to prior with no acute changes. I believe that some of the ST changes seen on his EKG and telemetry are repolarization changes second his LVH. He is asymptomatic and has no complaints this time. We will check his troponin today. Ibis near his baseline we'll discharge home.

## 2017-02-27 NOTE — Clinical Social Work Note (Signed)
CSW acknowledges consult, "Pt verbalized that he doesn't have money to get his medications sometimes." CSW will notify RNCM in morning progression.  CSW signing off. Consult again if any social work needs arise.  Dayton Scrape, [place]

## 2017-02-27 NOTE — Consult Note (Signed)
   [facility] Inpatient Consult   02/27/2017  Charles Hart [birth year] [id number]  Patient was assessed for [place] Management for community services in the [employer] [place]. Patient was previously active with [place] Management with [place], [facility] Social worker, and [place] around 1 year ago..  Met with patient at bedside regarding being restarted with [facility] services. Patient states, "I am going through a separation with my wife right now.  I feel like all of this stress it what has caused me to be sick and my blood pressure being high."  Patient verbalizing need of support for medications affording them and social work to assist in finding an affordable place/housing. His primary care provider is Dr. Joni Reining.   New Consent form signed with new emergency contact is his sister [name] at [phone]. His cell phone number confirmed to be [phone]. Patient given a folder with [place] Management information given.  Patient also states he needs his new HTA card.  Encouraged him to call the conceirge on his card for assistance with that.  Explained to the patient [facility] care managers can also meet with him at his primary care provider or to make other arrangements.   Of note, [facility] Care Management services does not replace or interfere with any services that are arranged by inpatient case management or social work. For additional questions or referrals please contact:   Natividad Brood, RN BSN [facility] Liaison  [phone] business mobile phone Toll free office [phone]

## 2017-02-27 NOTE — Care Management Note (Signed)
Case Management Note  Patient Details  Name: Charles Hart MRN: [id number] Date of Birth: [birth year]  Subjective/Objective:  Admitted with CHF                 Action/Plan: Patient has recently separated from his spouse and is tearful at time and is currently staying with his Doristine Bosworth; PCP Lucious Groves, DO; has private insurance with [employer] with prescription drug coverage; pharmacy of choice is [facility]. Patient stated that he is on a fixed income/ Disability and is having financial issues due to the marital separation. CM cannot assist patient with any medication due to patient has private insurance. Emotional support given.  Expected Discharge Date:  Possibly 7/20/208             Expected Discharge Plan:  Home/Self Care  In-House Referral:   [facility]  Discharge planning Services  CM Consult   Status of Service:  In process, will continue to follow  Sherrilyn Rist [phone] 02/27/2017, 11:10 AM

## 2017-02-27 NOTE — Progress Notes (Signed)
Pt has orders to be discharged. Discharge instructions given and pt has no additional questions at this time. Medication regimen reviewed and pt educated. Pt verbalized understanding and has no additional questions. Telemetry box removed. IV removed and site in good condition. Pt stable and waiting for transportation.  Arneda S[place] RN 

## 2017-02-27 NOTE — Discharge Instructions (Signed)
Continue to take your medications, limit your salt intake, and restrict your fluid intake.   Follow-up with the clinic and your heart doctor.   Heart Failure Heart failure means your heart has trouble pumping blood. This makes it hard for your body to work well. Heart failure is usually a long-term (chronic) condition. You must take good care of yourself and follow your doctor's treatment plan. Follow these instructions at home:  Take your heart medicine as told by your doctor. ? Do not stop taking medicine unless your doctor tells you to. ? Do not skip any dose of medicine. ? Refill your medicines before they run out. ? Take other medicines only as told by your doctor or pharmacist.  Stay active if told by your doctor. The elderly and people with severe heart failure should talk with a doctor about physical activity.  Eat heart-healthy foods. Choose foods that are without trans fat and are low in saturated fat, cholesterol, and salt (sodium). This includes fresh or frozen fruits and vegetables, fish, lean meats, fat-free or low-fat dairy foods, whole grains, and high-fiber foods. Lentils and dried peas and beans (legumes) are also good choices.  Limit salt if told by your doctor.  Cook in a healthy way. Roast, grill, broil, bake, poach, steam, or stir-fry foods.  Limit fluids as told by your doctor.  Weigh yourself every morning. Do this after you pee (urinate) and before you eat breakfast. Write down your weight to give to your doctor.  Take your blood pressure and write it down if your doctor tells you to.  Ask your doctor how to check your pulse. Check your pulse as told.  Lose weight if told by your doctor.  Stop smoking or chewing tobacco. Do not use gum or patches that help you quit without your doctor's approval.  Schedule and go to doctor visits as told.  Nonpregnant women should have no more than 1 drink a day. Men should have no more than 2 drinks a day. Talk to your  doctor about drinking alcohol.  Stop illegal drug use.  Stay current with shots (immunizations).  Manage your health conditions as told by your doctor.  Learn to manage your stress.  Rest when you are tired.  If it is really hot outside: ? Avoid intense activities. ? Use air conditioning or fans, or get in a cooler place. ? Avoid caffeine and alcohol. ? Wear loose-fitting, lightweight, and light-colored clothing.  If it is really cold outside: ? Avoid intense activities. ? Layer your clothing. ? Wear mittens or gloves, a hat, and a scarf when going outside. ? Avoid alcohol.  Learn about heart failure and get support as needed.  Get help to maintain or improve your quality of life and your ability to care for yourself as needed. Contact a doctor if:  You gain weight quickly.  You are more short of breath than usual.  You cannot do your normal activities.  You tire easily.  You cough more than normal, especially with activity.  You have any or more puffiness (swelling) in areas such as your hands, feet, ankles, or belly (abdomen).  You cannot sleep because it is hard to breathe.  You feel like your heart is beating fast (palpitations).  You get dizzy or light-headed when you stand up. Get help right away if:  You have trouble breathing.  There is a change in mental status, such as becoming less alert or not being able to focus.  You have  chest pain or discomfort.  You faint. This information is not intended to replace advice given to you by your health care provider. Make sure you discuss any questions you have with your health care provider. Document Released: 05/10/2008 Document Revised: 01/07/2016 Document Reviewed: 09/17/2012 Elsevier Interactive Patient Education  2017 [employer].

## 2017-02-28 ENCOUNTER — Other Ambulatory Visit: Payer: Self-pay | Admitting: Licensed Clinical Social Worker

## 2017-02-28 NOTE — Patient Outreach (Signed)
[facility]) Care Management  02/28/2017  Charles Hart 1955/10/18 [id number]   Assessment-CSW completed initial outreach attempt today after receiving new referral on patient on 02/27/17. CSW unable to reach patient successfully. CSW left a HIPPA compliant voice message encouraging patient to return call once available.  Plan-CSW will await return call or complete an additional outreach if needed.  Eula Fried, BSW, MSW, [email or web address] Phone: [phone] Fax: [phone]

## 2017-03-01 ENCOUNTER — Other Ambulatory Visit: Payer: Self-pay | Admitting: Pharmacist

## 2017-03-01 ENCOUNTER — Other Ambulatory Visit: Payer: Self-pay | Admitting: Licensed Clinical Social Worker

## 2017-03-01 ENCOUNTER — Other Ambulatory Visit: Payer: Self-pay

## 2017-03-01 NOTE — Patient Outreach (Signed)
[facility]) Care Management  03/01/2017  Charles Hart 1956/08/01 [id number]  Assessment- CSW received incoming call from patient. CSW answered and introduced self, reason for call yesterday and of [place]. Patient is agreeable to social work involvement. Patient states "I'm just going through a lot right now. I no longer have Medicaid anymore and I'm not sure why." CSW informed him that he either did no re-certify or now makes too much money to qualify. Patient states "I'm pretty sure it's because I didn't re-certify. CSW informed him that he will need to go to DSS and reapply for Medicaid. CSW questioned if she could complete home visit in order to provide: financial resources, information on how to apply for Medicaid, housing and senior resources. Patient reports that he currently residing at his pastor's home and would prefer to meet CSW at the [facility] office. Directions were provided.   CSW received incoming call from [place] and was notified that case will not be opened at this time.  Plan-CSW will send involvement letter to PCP. CSW will complete office visit tomorrow.  Eula Fried, BSW, MSW, [email or web address] Phone: [phone] Fax: [phone]

## 2017-03-01 NOTE — Patient Outreach (Signed)
   Unsuccessful attempts  x 2 made today to contact patient for this transition of care.  Plan: Make another attempt in the next 21 days to contact patient

## 2017-03-02 ENCOUNTER — Other Ambulatory Visit: Payer: Self-pay | Admitting: Licensed Clinical Social Worker

## 2017-03-02 ENCOUNTER — Other Ambulatory Visit: Payer: Self-pay

## 2017-03-02 NOTE — Patient Outreach (Signed)
[facility]) Care Management  [facility] Social Work  03/02/2017  Charles Hart 11-21-1955 [id number]   Encounter Medications:  Outpatient Encounter Prescriptions as of 03/02/2017  Medication Sig  . CARTIA XT 180 MG 24 hr capsule TAKE 1 CAPSULE(180 MG) BY MOUTH DAILY  . carvedilol (COREG) 12.5 MG tablet Take 1 tablet (12.5 mg total) by mouth 2 (two) times daily. (Patient not taking: Reported on 02/25/2017)  . clopidogrel (PLAVIX) 75 MG tablet Take 1 tablet (75 mg total) by mouth daily.  . furosemide (LASIX) 80 MG tablet Take 1 tablet (80 mg total) by mouth 2 (two) times daily.  [name] gabapentin (NEURONTIN) 300 MG capsule Take 300 mg by mouth at bedtime  . hydrALAZINE (APRESOLINE) 100 MG tablet Take 1 tablet (100 mg total) by mouth 3 (three) times daily.  . insulin aspart (NOVOLOG FLEXPEN) 100 UNIT/ML FlexPen Inject 7 Units into the skin 3 (three) times daily with meals.  . Insulin Glargine (LANTUS SOLOSTAR) 100 UNIT/ML Solostar Pen Inject 30 Units into the skin at bedtime.  . Multiple Vitamins-Minerals (ONE-A-DAY MENS 50+ ADVANTAGE PO) Take 1 tablet by mouth daily with breakfast.  . pravastatin (PRAVACHOL) 40 MG tablet Take 1 tablet (40 mg total) by mouth daily.  . sildenafil (VIAGRA) 50 MG tablet Take 2 tablets (100 mg total) by mouth as needed for erectile dysfunction. (Patient not taking: Reported on 02/25/2017)   No facility-administered encounter medications on file as of 03/02/2017.     Functional Status:  In your present state of health, do you have any difficulty performing the following activities: 02/26/2017 09/15/2016  Hearing? Y N  Vision? N N  Difficulty concentrating or making decisions? N N  Walking or climbing stairs? N N  Dressing or bathing? N N  Doing errands, shopping? N N  Preparing Food and eating ? - -  Using the Toilet? - -  In the past six months, have you accidently leaked urine? - -  Do you have problems with loss of bowel control? - -  Managing  your Medications? - -  Managing your Finances? - -  Housekeeping or managing your Housekeeping? - -  Some recent data might be hidden    Fall/Depression Screening:  PHQ 2/9 Scores 03/02/2017 09/15/2016 06/09/2016 03/11/2016 03/03/2016 02/26/2016 02/26/2016  PHQ - 2 Score 1 0 0 0 0 0 0    Assessment: CSW completed initial office visit with patient on 03/02/17. Patient has his own car and has stable transportation. Patient has excellent mobility and is able to get around very well. Patient has several stressors: a recent separation from spouse after 15 years, homelessness, patient lost Medicaid after not re-certifying. Patient reports "It's been a nightmare since I split from my wife." Patient states that he is trying to stay positive and implements appropriate coping methods such as: going to church, talking to pastor and daughter. Patient reports "I went to DSS last week and I think I already reapplied for Medicaid and Food Stamps." CSW asked for additional information but patient was unable to provide. CSW informed him that DSS usually communicates through mail and since he did not provide an accurate address, he may not be able to follow up with their requests. Patient is agreeable to gong to DSS today at 12:00 pm in order to make sure that he has provided all needed information in order to gain Food Stamps and Medicaid. CSW provided handout on "Helpful tips for applying for Medicaid." CSW provided education on the application process  for Medicaid. Patient reports that his monthly income is "around 800 per month." Patient shares that he is actively trying to find a part time job. CSW provided employment resource information. CSW also provided patient with: [place], [place], Pensions consultant, [place], Section 8 Housing, Development worker, community and [employer]. CSW completed review of all resources. Patient was encouraged to go to [employer] and meet with a housing Social worker. CSW also successfully placed patient on the wait list for [place] and [employer]. Patient understands that both wait list are up to 1 year so he will need to pursue other sources of houses in the meantime. Confirmation [id number]. CSW questioned if he patient would like to complete advance directives. Patient shares that he would like to take this home and discuss with his daughter. CSW provided education on how to complete form and filled out the demographic information for him. CSW provided emotional support over martial conflicts. CSW gave a worksheet on appropriate coping skills. Patient was appreciative.   [facility] CM Care Plan Problem One     Most Recent Value  Care Plan Problem One  Lack of overall support at this time and lack of community resources  Role Documenting the Problem One  Clinical Social Worker  Care Plan for Problem One  Active  [facility] Long Term Goal   Patient will reapply for Medicaid within 90 days  [facility] Long Term Goal Start Date  03/01/17  Interventions for Problem One Long Term Goal  CSW will complete office visit and will review and provide handouts on how to reapply for Medicaid as well as what documents to bring. CSW will monitor this goal and provide support as needed.  [facility] CM Short Term Goal #1   Patient will apply for food stamps within 30 days  [facility] CM Short Term Goal #1 Start Date  03/02/17  Interventions for Short Term Goal #1  Patient states that he thinks he went to DSS to apply for food stamps but hasn't heard back. CSW encouraged patient to go back to DSS to make sure he applied and if he wishes for CSW to complete food stamps application then she can. CSW will provide support and assistance when needed.     Plan: CSW will route encounter to PCP and follow up within two weeks.  Eula Fried, BSW, MSW, [email or web address] Phone:  [phone] Fax: [phone]

## 2017-03-02 NOTE — Patient Outreach (Signed)
    Unsuccessful attempt made to contact patient for transition of care contact. Unsuccessful attempt also made on 03/01/2017.  Plan: Make another attempt in the next 21 days

## 2017-03-02 NOTE — Patient Outreach (Addendum)
[place] [facility]) Care Management  [place]   03/02/2017  Charles Hart [birth year] [id number]  Late entry for 03/01/17.    Subjective:  Patient was referred to [place] Pharmacist by [facility] Liaison, Charles Hart for patient reported concerns with medication cost.    Patient's past medical history is significant for hypertension, heart failure, diabetes with renal complications, chronic kidney disease, hyperlipidemia.  Successful phone outreach to patient, HIPAA details verified.  Patient reports he was able to review medications over the phone.  He reports he doesn't know if he has SSA Extra Help---per review of notes in chart, appears Charles Hart has in the past tried to help patient with medication assistance and patient has previously been receiving SSA Extra Help, per 03/02/15 note.   He was admitted at [facility] 7/14-7/16/18 with heart failure exacerbation.    Objective:   Current Medications: Current Outpatient Prescriptions  Medication Sig Dispense Refill  . CARTIA XT 180 MG 24 hr capsule TAKE 1 CAPSULE(180 MG) BY MOUTH DAILY 90 capsule 3  . carvedilol (COREG) 12.5 MG tablet Take 1 tablet (12.5 mg total) by mouth 2 (two) times daily. 60 tablet 6  . clopidogrel (PLAVIX) 75 MG tablet Take 1 tablet (75 mg total) by mouth daily. 90 tablet 3  . furosemide (LASIX) 80 MG tablet Take 1 tablet (80 mg total) by mouth 2 (two) times daily. 180 tablet 1  . gabapentin (NEURONTIN) 300 MG capsule Take 300 mg by mouth at bedtime 90 capsule 3  . hydrALAZINE (APRESOLINE) 100 MG tablet Take 1 tablet (100 mg total) by mouth 3 (three) times daily. 270 tablet 3  . insulin aspart (NOVOLOG FLEXPEN) 100 UNIT/ML FlexPen Inject 7 Units into the skin 3 (three) times daily with meals. 15 mL 5  . Insulin Glargine (LANTUS SOLOSTAR) 100 UNIT/ML Solostar Pen Inject 30 Units into the skin at bedtime. 15 mL 5  . Multiple Vitamins-Minerals (ONE-A-DAY MENS 50+ ADVANTAGE PO) Take  1 tablet by mouth daily with breakfast.    . pravastatin (PRAVACHOL) 40 MG tablet Take 1 tablet (40 mg total) by mouth daily. 90 tablet 3  . sildenafil (VIAGRA) 50 MG tablet Take 2 tablets (100 mg total) by mouth as needed for erectile dysfunction. (Patient not taking: Reported on Charles Hart) 20 tablet 2   No current facility-administered medications for this visit.     Functional Status: In your present state of health, do you have any difficulty performing the following activities: 02/26/2017 09/15/2016  Hearing? Y N  Vision? N N  Difficulty concentrating or making decisions? N N  Walking or climbing stairs? N N  Dressing or bathing? N N  Doing errands, shopping? N N  Preparing Food and eating ? - -  Using the Toilet? - -  In the past six months, have you accidently leaked urine? - -  Do you have problems with loss of bowel control? - -  Managing your Medications? - -  Managing your Finances? - -  Housekeeping or managing your Housekeeping? - -  Some recent data might be hidden    Fall/Depression Screening: Fall Risk  09/15/2016 06/09/2016 03/11/2016  Falls in the past year? No No No  Number falls in past yr: - - -  Injury with Fall? - - -  Risk for fall due to : - - Impaired balance/gait;Impaired vision  Risk for fall due to (comments): - - -  Follow up - - -   PHQ 2/9 Scores  03/02/2017 09/15/2016 06/09/2016 03/11/2016 03/03/2016 02/26/2016 02/26/2016  PHQ - 2 Score 1 0 0 0 0 0 0    Assessment:  Medication review per patient report and review of medication discharge summary.  Patient was recently discharged from hospital and all medications have been reviewed.  Drugs sorted by system:  Neurologic/Psychologic: -gabapentin   Cardiovascular: -carvedilol -clopidogrel  -diltiazem -furosemide -hydralazine  -pravastatin   Endocrine: -insulin aspart (Novolog)  -insulin glargine (Lantus)  Miscellaneous: -multivitamin  -sildenafil   Medication assistance:  Placed call to  [facility] where patient fills his medications---was told Novolog co-pay was $3.70 and Lantus co-pay was $8.35---this indicates patient may still have some sort of SSA Extra Help.    Called patient back---he did not have his Medicare card available to verify his SSA Extra Help status.   Explained to patient if he has Extra Help, further patient assistance for medications will be limited.    He would have cost savings if he obtained 90 day supplies and was encouraged to do this for his maintenance medications.   He was encouraged to contact [facility] LCSW regarding Medicaid questions he has as [facility] LCSW was unsuccessful reaching him.    Plan:  Will route note to PCP.    Will close patient's case as he denies pharmacy related concerns and was encouraged to obtain 90 day supplies for cost savings.   Karrie Meres, PharmD, [place] [phone]

## 2017-03-06 ENCOUNTER — Encounter: Payer: Self-pay | Admitting: Internal Medicine

## 2017-03-06 ENCOUNTER — Ambulatory Visit (INDEPENDENT_AMBULATORY_CARE_PROVIDER_SITE_OTHER): Payer: PPO | Admitting: Internal Medicine

## 2017-03-06 DIAGNOSIS — I5042 Chronic combined systolic (congestive) and diastolic (congestive) heart failure: Secondary | ICD-10-CM | POA: Diagnosis not present

## 2017-03-06 DIAGNOSIS — Z79899 Other long term (current) drug therapy: Secondary | ICD-10-CM | POA: Diagnosis not present

## 2017-03-06 NOTE — Patient Instructions (Addendum)
Charles Hart,   It was a pleasure to meet you today  Please keep your fluid intake to less than 2 liters per day, and salt to less than 3 grams per day.  When you have received a scale, weigh yourself daily and call the clinic if you gain greater than 3 pounds in 24 hours or 5 pounds in one week   Schedule a follow up appointment to see Dr. Heber Valatie in 2 months You have an office visit scheduled with the cardiologist on august 8th.    Fluid Restriction Some health conditions may require you to restrict your fluid intake. This means that you need to limit the amount of fluid you drink each day. When you have a fluid restriction, you must carefully measure and keep track of the amount of fluid you drink. Your health care provider will identify the specific amount of fluid you are allowed each day. This amount may depend on several things, such as:  The amount of urine you produce in a day.  How much fluid you are keeping (retaining) in your body.  Your blood pressure.  What is my plan? Your health care provider recommends that you limit your fluid intake to 2 liters per day. What counts toward my fluid intake? Your fluid intake includes all liquids that you drink, as well as any foods that become liquid at room temperature. The following are examples of some fluids that you will have to restrict:  Tea, coffee, soda, lemonade, milk, water, juice, sport drinks, and nutritional supplement beverages.  Alcoholic beverages.  Cream.  Gravy.  Ice cubes.  Soup and broth.  The following are examples of foods that become liquid at room temperature. These foods will also count toward your fluid intake.  Ice cream and ice milk.  Frozen yogurt and sherbet.  Frozen ice pops.  Flavored gelatin.  How do I keep track of my fluid intake? Each morning, fill a jug with the amount of water that equals the amount of fluid you are allowed for the day. You can use this water as a guideline for  fluid allowance. Each time you take in any form of fluid, including ice cubes and foods that become liquid at room temperature, pour an equal amount of water out of the container. This helps you to see how much fluid you are taking in. It also helps you to see how much of your fluid intake is left for the rest of the day. The following conversions may also be helpful in measuring your fluid intake:  1 cup equals 8 oz (240 mL).   cup equals 6 oz (180 mL).  ? cup equals 5? oz (160 mL).   cup equals 4 oz (120 mL).  ? cup equals 2? oz (80 mL).   cup equals 2 oz (60 mL).  2 Tbsp equals 1 oz (30 mL).  What home care instructions should I follow while restricting fluids?  Make sure that you stay within the recommended limit each day. Always measure and keep track of your fluids, as well as any foods that turn liquid at room temperature.  Use small cups and glasses and learn to sip fluids slowly.  Add a slice of fresh lemon or lemon juice to water or ice. This helps to satisfy your thirst.  Freeze fruit juice or water in an ice cube tray. Use this as part of your fluid allowance. These cubes are useful for quenching your thirst. Measure the amount of liquid in  each ice cube prior to freezing so you can subtract this amount from your day's allowance when you consume each frozen cube.  Try frozen fruits between meals, such as grapes or strawberries.  Swallow your pills along with meals or soft foods, such as applesauce or mashed potatoes. This helps you to save your fluid allowance for something that you enjoy.  Weigh yourself every day. Keeping track of your daily weight can help you and your health care provider to notice as soon as possible if you are retaining too much fluid in your body. ? Weigh yourself every morning after you urinate but before you eat breakfast. ? Wear the same amount of clothing each time you weigh yourself. ? Write down your daily weight. Give this weight record  to your health care provider. If your weight is going up, you may be retaining too much fluid. Every 2 cups (480 mL) of fluid retained in the body becomes an extra 1 lb (0.45 kg) of body weight.  Avoid salty foods. These foods make you thirsty and make fluid control more difficult.  Brush your teeth often or rinse your mouth with mouthwash to help your dry mouth. Lemon wedges, hard sour candies, chewing gum, or breath spray may also help to moisten your mouth.  Keep the temperature in your home at a cooler level. Dry air increases thirst, so keep the air in your home as humid as possible.  Avoid being out in the hot sun, which can cause you to sweat and become thirsty. What are some signs that I may be taking in too much fluid? You may be taking in too much fluid if:  Your weight increases. Contact your health care provider if your weight increases 3 lb or more in a day or if it increases 5 lb or more in a week.  Your face, hands, legs, feet, and belly (abdomen) start to swell.  You have trouble breathing.  This information is not intended to replace advice given to you by your health care provider. Make sure you discuss any questions you have with your health care provider. Document Released: 05/29/2007 Document Revised: 01/07/2016 Document Reviewed: 12/31/2013 Elsevier Interactive Patient Education  [employer].

## 2017-03-06 NOTE — Progress Notes (Signed)
CC: hospital follow up for acute exacerbation of systolic heart failure   HPI:  [name] is a 61 y.o. with PMH as listed below who presents for follow up after hospitalization for acute heart failure exacerbation. Please see the assessment and plans for the status of the patient chronic medical problems.   Past Medical History:  Diagnosis Date  . Adenomatous colon polyp 07/02/2011   Last colonoscopy May 06, 2011 by Dr. Owens Loffler, who recommended repeat colonoscopy in 5 years.   . Background diabetic retinopathy 04/20/2012   Patient is followed by Dr. Katy Fitch   . Cardiomyopathy    LV function improved from 2004 to 2008.  Historically, moderately dilated LV with EF 30-40% by 2D echo 08/14/2002.  Mild CAD with severe LV dysfunction by cardiac cath 09/2002.  Normal coronary arteries and normal LV function by cardiac cath 09/19/2006.  A 2-D echo on 04/01/2009 showed mild concentric hypertrophy and normal systolic (LVEF  91-47%) and doppler C/W with grade 1 diastolic dysfunction.  . CHF (congestive heart failure) ([place])    LV function improved from 2004 to 2008.  Historically, moderately dilated LV with EF 30-40% by 2D echo 08/14/2002.  Mild CAD with severe LV dysfunction by cardiac cath 09/2002.  Normal coronary arteries and normal LV function by cardiac cath 09/19/2006.  A 2-D echo on 04/01/2009 showed mild concentric hypertrophy and normal systolic (LVEF  82-95%) and doppler C/W with grade 1 diastolic dysfunction..   . Chronic combined systolic and diastolic congestive heart failure ([place]) 05/21/2010   LV function improved from 2004 to 2008.  Historically, moderately dilated LV with EF 30-40% by 2D echo 08/14/2002.  Mild CAD with severe LV dysfunction by cardiac cath 09/2002.  Normal coronary arteries and normal LV function by cardiac cath 09/19/2006.  A 2-D echo on 04/01/2009 showed mild concentric hypertrophy and normal systolic (LVEF  62-13%) and doppler parameters consistent with abnormal  left   . CVA (cerebral vascular accident) ([place]) 07/04/2012   MRI of the brain 07/04/2012 showed an acute infarct in the right basal ganglia involving the anterior putamen, anterior limb internal capsule, and head of the caudate; this measured approximately 2.5 cm in diameter.     . Dermatitis   . Diabetes mellitus    type 2  . DIABETIC PERIPHERAL NEUROPATHY 08/03/2007   Qualifier: Diagnosis of  By: Marinda Elk MD, Sonia Side    . DM neuropathy, painful ([place])   . Dyspnea   . Hearing loss in right ear   . Hyperlipidemia   . Hypertension   . Hypertensive crisis 07/28/2012  . Hypertensive urgency 08/20/2014  . Myocardial infarction ([place])   . Nephrotic syndrome 02/18/2013   A 24-hour urine collection 03/04/2013 showed total protein of 5,460 g and creatinine clearance of 80 mL/minute.  Patient was seen by Seward Meth at [facility] and a repeat 24-hour urine showed 10,407 mg protein.  Patient underwent kidney biopsy on 05/30/2013; pathology showed advanced diffuse and nodular diabetic nephropathy with vascular changes consistent with long-standing difficult to control hypertension.      Review of Systems: Refer to history of present illness and assessment and plans for pertinent review of systems, all others reviewed and negative  Physical Exam:  Vitals:   03/06/17 1030  BP: 118/62  Pulse: (!) 57  Temp: 98.1 F (36.7 C)  TempSrc: Oral  SpO2: 100%  Weight: 223 lb 4.8 oz (101.3 kg)   Physical Exam  Constitutional: He is well-developed, well-nourished, and  in no distress. No distress.  Cardiovascular: Normal rate and regular rhythm.   No murmur heard. 1+ pitting edema bilateral lower extremities   Pulmonary/Chest: Effort normal and breath sounds normal. He has no wheezes. He has no rales.  Abdominal: Soft. He exhibits no distension.  Skin: Skin is warm and dry. He is not diaphoretic.   Assessment & Plan:   Chronic combined congestive heart failure   Patient was  recently hospitalized for acute exacerbation of CHF. He presented with progressive DOE and found to have BNP >1000 with vascular congestion on CXR. He reported good compliance with home medications but drinking a gallon of water or tea daily and the acute worsening was decided to be secondary to fluid indiscretion. Today he denies DOE or othopnea. Has not he has a hard time quantifying how much water and tea he has been drinking but believes it's about 3/4 gallon. Today we had further discussion on guidelines for daily weight monitoring and salt restrictions. He does not have access to a scale at this time, he is going through a separation and has been staying with a friend. He is a [facility] patient so hopefully can be provided another scale through their services, I will contact them today.  - continue carvedilol 12.5 BID, Lasix 80 mg BID, hydralazine 100 mg TID, Dilatiazem 180 mg daily   - enforce better fluid restriction   See Encounters Tab for problem based charting.  Patient discussed with Dr. Lynnae January

## 2017-03-07 ENCOUNTER — Other Ambulatory Visit: Payer: Self-pay

## 2017-03-08 NOTE — Patient Outreach (Signed)
[place] [facility]) Care Management   03/08/2017  Charles Hart 29-Sep-1955 [id number]  Charles Hart is an 61 y.o. male with a recent hospitalization for heart failure  Subjective:  I have been better  I am basically living from place to place. I do remember a lot of what I learned before about heart  Objective:   ROS  Well dressed, groomed elderly gentleman. Patient listened intensely during Heart Failure Education  Physical Exam  ROS  Encounter Medications:   Outpatient Encounter Prescriptions as of 03/07/2017  Medication Sig  . CARTIA XT 180 MG 24 hr capsule TAKE 1 CAPSULE(180 MG) BY MOUTH DAILY  . carvedilol (COREG) 12.5 MG tablet Take 1 tablet (12.5 mg total) by mouth 2 (two) times daily.  . clopidogrel (PLAVIX) 75 MG tablet Take 1 tablet (75 mg total) by mouth daily.  . furosemide (LASIX) 80 MG tablet Take 1 tablet (80 mg total) by mouth 2 (two) times daily.  [name] gabapentin (NEURONTIN) 300 MG capsule Take 300 mg by mouth at bedtime  . hydrALAZINE (APRESOLINE) 100 MG tablet Take 1 tablet (100 mg total) by mouth 3 (three) times daily.  . insulin aspart (NOVOLOG FLEXPEN) 100 UNIT/ML FlexPen Inject 7 Units into the skin 3 (three) times daily with meals.  . Insulin Glargine (LANTUS SOLOSTAR) 100 UNIT/ML Solostar Pen Inject 30 Units into the skin at bedtime.  . Multiple Vitamins-Minerals (ONE-A-DAY MENS 50+ ADVANTAGE PO) Take 1 tablet by mouth daily with breakfast.  . pravastatin (PRAVACHOL) 40 MG tablet Take 1 tablet (40 mg total) by mouth daily.  . sildenafil (VIAGRA) 50 MG tablet Take 2 tablets (100 mg total) by mouth as needed for erectile dysfunction. (Patient not taking: Reported on 02/25/2017)   No facility-administered encounter medications on file as of 03/07/2017.     Functional Status:   In your present state of health, do you have any difficulty performing the following activities: 03/07/2017 02/26/2017  Hearing? N Y  Vision? Y N  Difficulty  concentrating or making decisions? N N  Walking or climbing stairs? Y N  Dressing or bathing? N N  Doing errands, shopping? N N  Preparing Food and eating ? N -  Using the Toilet? N -  In the past six months, have you accidently leaked urine? N -  Do you have problems with loss of bowel control? N -  Managing your Medications? N -  Managing your Finances? N -  Housekeeping or managing your Housekeeping? N -  Some recent data might be hidden    Fall/Depression Screening:    Fall Risk  03/07/2017 09/15/2016 06/09/2016  Falls in the past year? No No No  Number falls in past yr: - - -  Injury with Fall? - - -  Risk for fall due to : Impaired vision - -  Risk for fall due to (comments): - - -  Follow up - - -   PHQ 2/9 Scores 03/07/2017 03/02/2017 09/15/2016 06/09/2016 03/11/2016 03/03/2016 02/26/2016  PHQ - 2 Score 0 1 0 0 0 0 0   [facility] CM Care Plan Problem One     Most Recent Value  Care Plan Problem One  patient had a recent hospitalization for heart failure   Role Documenting the Problem One  Care Management [place] for Problem One  Active  [facility] Long Term Goal   patient will have no acute care admissions in the next 31 days  [facility] Long Term Goal Start Date  03/01/17  Interventions for Problem One Long Term Goal  7/24 initial viist with patient for assessment of his case management goals.  Patient advises he has not had any acute care advmissions since our last telephonic encounter  [facility] CM Short Term Goal #1   Patient will meet with this RNCM for Heart Failure Education in the next 28 days  [facility] CM Short Term Goal #1 Start Date  03/01/17  [facility] CM Short Term Goal #1 Met Date  03/07/17  Interventions for Short Term Goal #1  7/24 Patient met with [facility] RNCM for Heart Failure Education.  Patient advises the information was helpful and he remembered most of the content of the EMMI Heart Failure Bundle     Assessment:   Patient endorses daily weights and compliance with medication  regimen. Patient also endorses compliance with low sodium diet. Patient is currently separated from his wife, explains things are the worse they can be. Patient listened and interacted with Heart Failure Education, asked questions during sessions.   Plan:  Telephone contact with patient next week to assess patients progression in meeting his case management goals

## 2017-03-09 NOTE — Assessment & Plan Note (Signed)
Patient was recently hospitalized for acute exacerbation of CHF. He presented with progressive DOE and found to have BNP >1000 with vascular congestion on CXR. He reported good compliance with home medications but drinking a gallon of water or tea daily and the acute worsening was decided to be secondary to fluid indiscretion. Today he denies DOE or othopnea. Has not he has a hard time quantifying how much water and tea he has been drinking but believes it's about 3/4 gallon. Today we had further discussion on guidelines for daily weight monitoring and salt restrictions. He does not have access to a scale at this time, he is going through a separation and has been staying with a friend. He is a [facility] patient so hopefully can be provided another scale through their services, I will contact them today.  - continue carvedilol 12.5 BID, Lasix 80 mg BID, hydralazine 100 mg TID, Dilatiazem 180 mg daily   - enforce better fluid restriction

## 2017-03-10 ENCOUNTER — Other Ambulatory Visit: Payer: Self-pay | Admitting: Licensed Clinical Social Worker

## 2017-03-10 NOTE — Patient Outreach (Signed)
[place] [facility]) Care Management  03/10/2017  Charles Hart 04-28-1956 [id number]  Assessment-CSW completed outreach attempt today to follow up on whether patient was able to go to DSS and inquire about his food stamps and Medicaid. CSW unable to reach patient successfully. CSW left a HIPPA compliant voice message encouraging patient to return call once available.  Plan-CSW will await return call or complete an additional outreach if needed within 1-2 weeks.   Eula Fried, BSW, MSW, [email or web address] Phone: [phone] Fax: [phone]

## 2017-03-13 ENCOUNTER — Other Ambulatory Visit: Payer: Self-pay

## 2017-03-13 NOTE — Progress Notes (Signed)
Internal Medicine Clinic Attending  I saw and evaluated the patient.  I personally confirmed the key portions of the history and exam documented by Dr. Blum and I reviewed pertinent patient test results.  The assessment, diagnosis, and plan were formulated together and I agree with the documentation in the resident's note. 

## 2017-03-13 NOTE — Patient Outreach (Signed)
    Telephone call made to patient to advise him [facility] would provide him with a scale for daily weighing, however, the scale has to be ordered and delivery would take 7-10 days of ordering. Charles Mix, Freda Jackson, contacted and asked to order scales for patient.  Plan: Notify patient to pick up scale once scales are delivered to [facility] office.

## 2017-03-14 ENCOUNTER — Other Ambulatory Visit: Payer: Self-pay

## 2017-03-14 ENCOUNTER — Other Ambulatory Visit: Payer: Self-pay | Admitting: Licensed Clinical Social Worker

## 2017-03-14 NOTE — Patient Outreach (Signed)
[facility]) Care Management  03/14/2017   MARE LUDTKE 26-Jan-1956 [id number]  Subjective:  I am doing okay, I am looking at apartments right now.  Objective:  Patient is currently in transiton, looking for permanent housing.  Current Medications:  Current Outpatient Prescriptions  Medication Sig Dispense Refill  . CARTIA XT 180 MG 24 hr capsule TAKE 1 CAPSULE(180 MG) BY MOUTH DAILY 90 capsule 3  . carvedilol (COREG) 12.5 MG tablet Take 1 tablet (12.5 mg total) by mouth 2 (two) times daily. 60 tablet 6  . clopidogrel (PLAVIX) 75 MG tablet Take 1 tablet (75 mg total) by mouth daily. 90 tablet 3  . furosemide (LASIX) 80 MG tablet Take 1 tablet (80 mg total) by mouth 2 (two) times daily. 180 tablet 1  . gabapentin (NEURONTIN) 300 MG capsule Take 300 mg by mouth at bedtime 90 capsule 3  . hydrALAZINE (APRESOLINE) 100 MG tablet Take 1 tablet (100 mg total) by mouth 3 (three) times daily. 270 tablet 3  . insulin aspart (NOVOLOG FLEXPEN) 100 UNIT/ML FlexPen Inject 7 Units into the skin 3 (three) times daily with meals. 15 mL 5  . Insulin Glargine (LANTUS SOLOSTAR) 100 UNIT/ML Solostar Pen Inject 30 Units into the skin at bedtime. 15 mL 5  . Multiple Vitamins-Minerals (ONE-A-DAY MENS 50+ ADVANTAGE PO) Take 1 tablet by mouth daily with breakfast.    . pravastatin (PRAVACHOL) 40 MG tablet Take 1 tablet (40 mg total) by mouth daily. 90 tablet 3  . sildenafil (VIAGRA) 50 MG tablet Take 2 tablets (100 mg total) by mouth as needed for erectile dysfunction. (Patient not taking: Reported on 02/25/2017) 20 tablet 2   No current facility-administered medications for this visit.     Functional Status:  In your present state of health, do you have any difficulty performing the following activities: 03/07/2017 02/26/2017  Hearing? N Y  Comment - Difficulty that started about 5 years ago per pt  Vision? Y N  Difficulty concentrating or making decisions? N N  Walking or climbing  stairs? Y N  Dressing or bathing? N N  Doing errands, shopping? N N  Preparing Food and eating ? N -  Using the Toilet? N -  In the past six months, have you accidently leaked urine? N -  Do you have problems with loss of bowel control? N -  Managing your Medications? N -  Managing your Finances? N -  Housekeeping or managing your Housekeeping? N -  Some recent data might be hidden    Fall/Depression Screening: Fall Risk  03/07/2017 09/15/2016 06/09/2016  Falls in the past year? No No No  Number falls in past yr: - - -  Injury with Fall? - - -  Risk for fall due to : Impaired vision - -  Risk for fall due to: Comment - - -  Follow up - - -   PHQ 2/9 Scores 03/07/2017 03/02/2017 09/15/2016 06/09/2016 03/11/2016 03/03/2016 02/26/2016  PHQ - 2 Score 0 1 0 0 0 0 0   THN CM Care Plan Problem One     Most Recent Value  Care Plan Problem One  patient had a recent hospitalization for heart failure   Role Documenting the Problem One  Care Management [place] for Problem One  Active  THN Long Term Goal   patient will have no acute care admissions in the next 31 days  THN Long Term Goal Start Date  03/01/17  Interventions for Problem  One Long Term Goal  7/31 telephone contact  to assess progress iin reaching her case management goals  THN CM Short Term Goal #1   Patient will meet with this RNCM for Heart Failure Education in the next 28 days  THN CM Short Term Goal #1 Start Date  03/01/17  [facility] CM Short Term Goal #1 Met Date  03/07/17  Interventions for Short Term Goal #1  7/24 Patient met with [facility] RNCM for Heart Failure Education.  Patient advises the information was helpful and he remembered most of the content of the EMMI Heart Failure Bundle     Assessment:  Patient advised that [facility] will be providing him with a scale through the Heart Failure Management Program. Patient agrees to pick up scales from office when scales arrive. Patent endorses intent to comply with daily weights  once he gets the scales.  Plan:  Contact with patient in the next 28 days for heart failure education, assessment of community care coordination

## 2017-03-14 NOTE — Patient Outreach (Signed)
[facility]) Care Management  03/14/2017  KANYON SEIBOLD 08-01-1956 [id number]  Assessment- CSW completed call to patient and he answered. Patient shares that he is doing "very well" and that is "feeling great." CSW questioned if patient followed through with CSW's suggestions and went to DSS. Patient confirms that he went to DSS and "I was granted both Medicaid and food stamps." CSW questioned if he provided DSS with his pastor's address so that they could mail him his EBT card. Patient declined and stated "I picked up the EBT card there instead." CSW shares that he has looked at a few housing options but has not found anything yet but will continue his search. CSW reviewed housing resources with patient. Patient appreciative of call and social work support.  Plan-CSW will follow up within three weeks. Patient confirms that he has both Medicaid and Food Stamps now.   Eula Fried, BSW, MSW, [email or web address] Phone: [phone] Fax: [phone]

## 2017-03-15 ENCOUNTER — Other Ambulatory Visit: Payer: PPO

## 2017-03-16 ENCOUNTER — Ambulatory Visit: Payer: PPO | Admitting: Pharmacist

## 2017-03-20 ENCOUNTER — Other Ambulatory Visit: Payer: Self-pay

## 2017-03-20 DIAGNOSIS — G4733 Obstructive sleep apnea (adult) (pediatric): Secondary | ICD-10-CM | POA: Diagnosis not present

## 2017-03-20 NOTE — Patient Outreach (Signed)
    Telephone contact with patient for quality documentation. Patient reports progress in finding stable housing, states he has been looking for apartments. Patient denies signs and symptoms of CHF exacerbation.   Plan: Contact with patient in the next 21 days to assess his progress in meeting his case management goals

## 2017-03-21 ENCOUNTER — Other Ambulatory Visit: Payer: Self-pay | Admitting: Licensed Clinical Social Worker

## 2017-03-21 ENCOUNTER — Other Ambulatory Visit: Payer: Self-pay

## 2017-03-21 NOTE — Patient Outreach (Addendum)
     This RNCM made telephone call to patient to advise him the scales ordered and provided by [facility] are ready for him to pick up for his use. Patient advised this RNCM he would pick up the scales, stated he remembers the locations of the location of the office as he has met this RNCM there before for community care coordination. RNCM advised patient he would be provided with a book bag for his convenience to carry his scale as his housing is unstable at this time.   Plan: Telephone contact with patient in the next 21 days.

## 2017-03-21 NOTE — Patient Outreach (Signed)
[facility]) Care Management  03/21/2017  DANYL DEEMS May 01, 1956 [id number]  Assessment-CSW completed outreach attempt today. CSW unable to reach patient successfully. CSW left a HIPPA compliant voice message encouraging patient to return call once available to see if an office visit appointment could be scheduled since he has to pick up scales from office.   Plan-CSW will await return call or complete an additional outreach if needed.  Eula Fried, BSW, MSW, [email or web address] Phone: [phone] Fax: [phone]

## 2017-03-21 NOTE — Progress Notes (Signed)
Patient ID: Charles Hart, male   DOB: 1956/03/09, 61 y.o.   MRN: [id number] PCP:Charles Charles Hart  Primary Cardiologist: Charles Charles Hart  Nephrology: Charles Charles Hart Charles Hart   HPI:  [name] is a 61 y.o.   AA with poorly controlled HTN, CVA 2013 on plavix,  Hyperlipidemia , heart caths 2004 and 2008 with normal cors, and DM complicated by CRF. History of CHF with depressed EF in the past, but ECHO  August 2014 showed recovery with EF 55-60% Most recent echo 02/27/17  EF was back down to 25-30%.  He is not on ace due to allergy and CKD.  Baseline Cr is 4.7 most recently checked 02/27/17   Indicates seen by [facility] and no fistula planned going through a divorce after 15 years of marriage Which has blind sided him some   ROS: All systems negative except as listed in HPI, PMH and Problem List.  Echo  02/23/17 Study Conclusions  - Left ventricle: The cavity size was severely dilated. Wall   thickness was increased in a pattern of moderate LVH. Systolic   function was severely reduced. The estimated ejection fraction   was in the range of 25% to 30%. Diffuse hypokinesis. Features are   consistent with a pseudonormal left ventricular filling pattern,   with concomitant abnormal relaxation and increased filling   pressure (grade 2 diastolic dysfunction). Doppler parameters are   consistent with high ventricular filling pressure. - Aortic valve: There was trivial regurgitation. - Aortic root: The aortic root was mildly dilated. - Mitral valve: Calcified annulus. There was mild regurgitation. - Left atrium: The atrium was moderately dilated. - Right atrium: The atrium was moderately dilated.  Impressions:  - Severe global reduction in LV systolic function; severe LVE;   moderate LVH; moderate diastolic dysfunction with elevated LV   filling pressure; mildly dilated aortic root; trace AI; mild MR;   biatrial enlargement.   SH:  Social History   Social History  . Marital status: Married    Spouse name:  N/A  . Number of children: N/A  . Years of education: N/A   Occupational History  . Not on file.   Social History Main Topics  . Smoking status: Never Smoker  . Smokeless tobacco: Never Used  . Alcohol use No     Comment: Wine rarely.  . Drug use: No  . Sexual activity: Not on file   Other Topics Concern  . Not on file   Social History Narrative  . No narrative on file    FH:  Family History  Problem Relation Age of Onset  . Aneurysm Father 29       died of rupture  . Colon cancer Sister     Past Medical History:  Diagnosis Date  . Adenomatous colon polyp 07/02/2011   Last colonoscopy May 06, 2011 by Charles. Owens Hart, who recommended repeat colonoscopy in 5 years.   . Background diabetic retinopathy 04/20/2012   Patient is followed by Charles. Katy Hart   . Cardiomyopathy    LV function improved from 2004 to 2008.  Historically, moderately dilated LV with EF 30-40% by 2D echo 08/14/2002.  Mild CAD with severe LV dysfunction by cardiac cath 09/2002.  Normal coronary arteries and normal LV function by cardiac cath 09/19/2006.  A 2-D echo on 04/01/2009 showed mild concentric hypertrophy and normal systolic (LVEF  09-73%) and doppler C/W with grade 1 diastolic dysfunction.  . CHF (congestive heart failure) ([place])    LV function improved from 2004  to 2008.  Historically, moderately dilated LV with EF 30-40% by 2D echo 08/14/2002.  Mild CAD with severe LV dysfunction by cardiac cath 09/2002.  Normal coronary arteries and normal LV function by cardiac cath 09/19/2006.  A 2-D echo on 04/01/2009 showed mild concentric hypertrophy and normal systolic (LVEF  70-62%) and doppler C/W with grade 1 diastolic dysfunction..   . Chronic combined systolic and diastolic congestive heart failure ([place]) 05/21/2010   LV function improved from 2004 to 2008.  Historically, moderately dilated LV with EF 30-40% by 2D echo 08/14/2002.  Mild CAD with severe LV dysfunction by cardiac cath 09/2002.  Normal coronary  arteries and normal LV function by cardiac cath 09/19/2006.  A 2-D echo on 04/01/2009 showed mild concentric hypertrophy and normal systolic (LVEF  37-62%) and doppler parameters consistent with abnormal left   . CVA (cerebral vascular accident) ([place]) 07/04/2012   MRI of the brain 07/04/2012 showed an acute infarct in the right basal ganglia involving the anterior putamen, anterior limb internal capsule, and head of the caudate; this measured approximately 2.5 cm in diameter.     . Dermatitis   . Diabetes mellitus    type 2  . DIABETIC PERIPHERAL NEUROPATHY 08/03/2007   Qualifier: Diagnosis of  By: Charles Elk MD, Charles Hart    . DM neuropathy, painful ([place])   . Dyspnea   . Hearing loss in right ear   . Hyperlipidemia   . Hypertension   . Hypertensive crisis 07/28/2012  . Hypertensive urgency 08/20/2014  . Myocardial infarction ([place])   . Nephrotic syndrome 02/18/2013   A 24-hour urine collection 03/04/2013 showed total protein of 5,460 g and creatinine clearance of 80 mL/minute.  Patient was seen by Charles Hart at [facility] and a repeat 24-hour urine showed 10,407 mg protein.  Patient underwent kidney biopsy on 05/30/2013; pathology showed advanced diffuse and nodular diabetic nephropathy with vascular changes consistent with long-standing difficult to control hypertension.       Current Outpatient Prescriptions  Medication Sig Dispense Refill  . CARTIA XT 180 MG 24 hr capsule TAKE 1 CAPSULE(180 MG) BY MOUTH DAILY 90 capsule 3  . carvedilol (COREG) 12.5 MG tablet Take 1 tablet (12.5 mg total) by mouth 2 (two) times daily. 60 tablet 6  . clopidogrel (PLAVIX) 75 MG tablet Take 1 tablet (75 mg total) by mouth daily. 90 tablet 3  . furosemide (LASIX) 80 MG tablet Take 1 tablet (80 mg total) by mouth 2 (two) times daily. 180 tablet 1  . gabapentin (NEURONTIN) 300 MG capsule Take 300 mg by mouth at bedtime 90 capsule 3  . hydrALAZINE (APRESOLINE) 100 MG tablet Take 1 tablet (100  mg total) by mouth 3 (three) times daily. 270 tablet 3  . insulin aspart (NOVOLOG FLEXPEN) 100 UNIT/ML FlexPen Inject 7 Units into the skin 3 (three) times daily with meals. 15 mL 5  . Insulin Glargine (LANTUS SOLOSTAR) 100 UNIT/ML Solostar Pen Inject 30 Units into the skin at bedtime. 15 mL 5  . Multiple Vitamins-Minerals (ONE-A-DAY MENS 50+ ADVANTAGE PO) Take 1 tablet by mouth daily with breakfast.    . pravastatin (PRAVACHOL) 40 MG tablet Take 1 tablet (40 mg total) by mouth daily. 90 tablet 3   No current facility-administered medications for this visit.     Vitals:   03/22/17 1108  BP: 138/78  Pulse: 69  SpO2: 95%  Weight: 228 lb (103.4 kg)  Height: 6' (1.829 m)    PHYSICAL EXAM: BP 138/78  Pulse 69   Ht 6' (1.829 m)   Wt 228 lb (103.4 kg)   SpO2 95%   BMI 30.92 kg/m   General:  Well appearing. No resp difficulty Affect appropriate Overweight black male HEENT: normal Neck supple with no adenopathy JVP normal bilateral  bruits no thyromegaly Lungs clear with no wheezing and good diaphragmatic motion Heart:  S1/S2 SEM murmur, no rub, gallop or click PMI normal Abdomen: benighn, BS positve, no tenderness, no AAA no bruit.  No HSM or HJR Distal pulses intact with no bruits No edema Neuro non-focal Skin warm and dry No muscular weakness    ECG:  12/09/14  SR rate 93 LVH with chronic T wave changes inverted in lateral leads  ASSESSMENT & PLAN:  1. Combined Systolic Heart Failure: Low EF in the past, thought to be due to poorly controlled BP.  LHC in 2004 and 2008 with no significant CAD.  Echo 02/23/17 with EF back down to 25-30% Cannot take entresto/ARC/ARB due to stage 4 renal failure  2. CKD Stage III: Followed by Charles Charles Hart . Not on ACEI.  Due to low GFR Better insight today into his need for dialysis . F/U Charles Charles Hart should probably have fistula placed in LUE at this point Not clear If he is too old for transplant told him to discuss with Charles Charles Hart   3. HTN: BP  improved   4. Anemia:  Improved not on erythropoitin at this time  5. CVA: on plavix , bb, and statin 6. ED: stop viagra given need for nitrates to Rx low EF/CHF 7. Bruit:  F/u carotid duplex ASA   Valentina Shaggy 03/22/2017

## 2017-03-21 NOTE — Telephone Encounter (Signed)
This encounter was created in error - please disregard.

## 2017-03-22 ENCOUNTER — Ambulatory Visit (INDEPENDENT_AMBULATORY_CARE_PROVIDER_SITE_OTHER): Payer: PPO | Admitting: Cardiovascular Disease

## 2017-03-22 ENCOUNTER — Ambulatory Visit (INDEPENDENT_AMBULATORY_CARE_PROVIDER_SITE_OTHER): Payer: PPO | Admitting: Pharmacist

## 2017-03-22 ENCOUNTER — Encounter: Payer: Self-pay | Admitting: Cardiovascular Disease

## 2017-03-22 ENCOUNTER — Encounter: Payer: Self-pay | Admitting: Pharmacist

## 2017-03-22 VITALS — BP 138/78 | HR 69 | Ht 72.0 in | Wt 228.0 lb

## 2017-03-22 DIAGNOSIS — Z794 Long term (current) use of insulin: Secondary | ICD-10-CM | POA: Diagnosis not present

## 2017-03-22 DIAGNOSIS — E1142 Type 2 diabetes mellitus with diabetic polyneuropathy: Secondary | ICD-10-CM | POA: Diagnosis not present

## 2017-03-22 DIAGNOSIS — D649 Anemia, unspecified: Secondary | ICD-10-CM

## 2017-03-22 DIAGNOSIS — R0989 Other specified symptoms and signs involving the circulatory and respiratory systems: Secondary | ICD-10-CM | POA: Diagnosis not present

## 2017-03-22 DIAGNOSIS — N184 Chronic kidney disease, stage 4 (severe): Secondary | ICD-10-CM | POA: Diagnosis not present

## 2017-03-22 DIAGNOSIS — E113593 Type 2 diabetes mellitus with proliferative diabetic retinopathy without macular edema, bilateral: Secondary | ICD-10-CM

## 2017-03-22 DIAGNOSIS — E1122 Type 2 diabetes mellitus with diabetic chronic kidney disease: Secondary | ICD-10-CM | POA: Diagnosis not present

## 2017-03-22 DIAGNOSIS — E1129 Type 2 diabetes mellitus with other diabetic kidney complication: Secondary | ICD-10-CM

## 2017-03-22 NOTE — Progress Notes (Signed)
Freestyle Libre Pro CGM sensor placed and started. Patient was educated about wearing sensor, keeping food, activity and medication log and when to call office. Follow up was arranged with the patient.   

## 2017-03-22 NOTE — Patient Instructions (Signed)
Medication Instructions:  Your physician recommends that you continue on your current medications as directed. Please refer to the Current Medication list given to you today.   Labwork: None ordered  Testing/Procedures: Your physician has requested that you have a carotid duplex. This test is an ultrasound of the carotid arteries in your neck. It looks at blood flow through these arteries that supply the brain with blood. Allow one hour for this exam. There are no restrictions or special instructions.    Follow-Up: Your physician wants you to follow-up in: 6 months with Dr. [name]. You will receive a reminder letter in the mail two months in advance. If you don't receive a letter, please call our office to schedule the follow-up appointment.   Any Other Special Instructions Will Be Listed Below (If Applicable).     If you need a refill on your cardiac medications before your next appointment, please call your pharmacy.

## 2017-03-22 NOTE — Patient Instructions (Addendum)
Please record the time, amount and what food drinks and activities you have while wearing the continuous glucose monitor(CGM) in the folder provided.  Bring the folder with you to follow up appointments  Do not have a CT or an MRI while wearing the CGM.   Please make an appointment for 1 week with me and a doctor for the first of two CGM downloads..   You will also return in 2 weeks to have your second download and the CGM removed.  

## 2017-03-29 ENCOUNTER — Ambulatory Visit: Payer: PPO | Admitting: Pharmacist

## 2017-03-29 ENCOUNTER — Encounter: Payer: Self-pay | Admitting: Pharmacist

## 2017-03-29 ENCOUNTER — Ambulatory Visit (INDEPENDENT_AMBULATORY_CARE_PROVIDER_SITE_OTHER): Payer: PPO | Admitting: Pharmacist

## 2017-03-29 DIAGNOSIS — Z794 Long term (current) use of insulin: Secondary | ICD-10-CM | POA: Diagnosis not present

## 2017-03-29 DIAGNOSIS — E1129 Type 2 diabetes mellitus with other diabetic kidney complication: Secondary | ICD-10-CM | POA: Diagnosis not present

## 2017-03-29 NOTE — Progress Notes (Addendum)
Patient was seen in clinic with Danella Penton, PharmD candidate. I agree with the assessment and plan of care documented.  Patient was instructed to reduce Lantus to 20 units daily, track his diet and BG in a daily log, and to skip Novolog if he skips a meal. Patient repeated back for understanding.  Placed order with [place] for a personal [place]estyle [place] [phone])

## 2017-03-29 NOTE — Patient Instructions (Signed)
Patient educated about medication as defined in this encounter and verbalized understanding by repeating back instructions provided.   

## 2017-03-29 NOTE — Telephone Encounter (Signed)
Came to [facility]

## 2017-03-29 NOTE — Progress Notes (Signed)
S: Charles Hart is a 61 y.o. male reports to clinical pharmacist appointment for diabetes management.  Allergies  Allergen Reactions  . Amlodipine Swelling  . Ivp Dye [Iodinated Diagnostic Agents] Other (See Comments)    Per patient's Nephrologist, he doesn't want the patient exposed to ANY dye because of issues with his kidneys  . Lisinopril Cough  . Red Dye Other (See Comments)    NO dye of any kind (issues with his kidneys)   Prior to Admission medications   Medication Sig  CARTIA XT 180 MG 24 hr capsule TAKE 1 CAPSULE(180 MG) BY MOUTH DAILY  carvedilol (COREG) 12.5 MG tablet Take 1 tablet (12.5 mg total) by mouth 2 (two) times daily.  clopidogrel (PLAVIX) 75 MG tablet Take 1 tablet (75 mg total) by mouth daily.  furosemide (LASIX) 80 MG tablet Take 1 tablet (80 mg total) by mouth 2 (two) times daily.  gabapentin (NEURONTIN) 300 MG capsule Take 300 mg by mouth at bedtime  hydrALAZINE (APRESOLINE) 100 MG tablet Take 1 tablet (100 mg total) by mouth 3 (three) times daily.  insulin aspart (NOVOLOG FLEXPEN) 100 UNIT/ML FlexPen Inject 7 Units into the skin 3 (three) times daily with meals.  Insulin Glargine (LANTUS SOLOSTAR) 100 UNIT/ML Solostar Pen Inject 30 Units into the skin at bedtime.  Multiple Vitamins-Minerals (ONE-A-DAY MENS 50+ ADVANTAGE PO) Take 1 tablet by mouth daily with breakfast.  pravastatin (PRAVACHOL) 40 MG tablet Take 1 tablet (40 mg total) by mouth daily.   Past Medical History:  Diagnosis Date  . Adenomatous colon polyp 07/02/2011   Last colonoscopy May 06, 2011 by Dr. Owens Loffler, who recommended repeat colonoscopy in 5 years.   . Background diabetic retinopathy 04/20/2012   Patient is followed by Dr. Katy Fitch   . Cardiomyopathy    LV function improved from 2004 to 2008.  Historically, moderately dilated LV with EF 30-40% by 2D echo 08/14/2002.  Mild CAD with severe LV dysfunction by cardiac cath 09/2002.  Normal coronary arteries and normal LV function by  cardiac cath 09/19/2006.  A 2-D echo on 04/01/2009 showed mild concentric hypertrophy and normal systolic (LVEF  31-49%) and doppler C/W with grade 1 diastolic dysfunction.  . CHF (congestive heart failure) ([place])    LV function improved from 2004 to 2008.  Historically, moderately dilated LV with EF 30-40% by 2D echo 08/14/2002.  Mild CAD with severe LV dysfunction by cardiac cath 09/2002.  Normal coronary arteries and normal LV function by cardiac cath 09/19/2006.  A 2-D echo on 04/01/2009 showed mild concentric hypertrophy and normal systolic (LVEF  70-26%) and doppler C/W with grade 1 diastolic dysfunction..   . Chronic combined systolic and diastolic congestive heart failure ([place]) 05/21/2010   LV function improved from 2004 to 2008.  Historically, moderately dilated LV with EF 30-40% by 2D echo 08/14/2002.  Mild CAD with severe LV dysfunction by cardiac cath 09/2002.  Normal coronary arteries and normal LV function by cardiac cath 09/19/2006.  A 2-D echo on 04/01/2009 showed mild concentric hypertrophy and normal systolic (LVEF  37-85%) and doppler parameters consistent with abnormal left   . CVA (cerebral vascular accident) ([place]) 07/04/2012   MRI of the brain 07/04/2012 showed an acute infarct in the right basal ganglia involving the anterior putamen, anterior limb internal capsule, and head of the caudate; this measured approximately 2.5 cm in diameter.     . Dermatitis   . Diabetes mellitus    type 2  . DIABETIC PERIPHERAL NEUROPATHY 08/03/2007  Qualifier: Diagnosis of  By: Marinda Elk MD, Sonia Side    . DM neuropathy, painful ([place])   . Dyspnea   . Hearing loss in right ear   . Hyperlipidemia   . Hypertension   . Hypertensive crisis 07/28/2012  . Hypertensive urgency 08/20/2014  . Myocardial infarction ([place])   . Nephrotic syndrome 02/18/2013   A 24-hour urine collection 03/04/2013 showed total protein of 5,460 g and creatinine clearance of 80 mL/minute.  Patient was seen by Seward Meth at [facility] and a repeat 24-hour urine showed 10,407 mg protein.  Patient underwent kidney biopsy on 05/30/2013; pathology showed advanced diffuse and nodular diabetic nephropathy with vascular changes consistent with long-standing difficult to control hypertension.      Social History   Social History  . Marital status: Married    Spouse name: N/A  . Number of children: N/A  . Years of education: N/A   Social History Main Topics  . Smoking status: Never Smoker  . Smokeless tobacco: Never Used  . Alcohol use No     Comment: Wine rarely.  . Drug use: No  . Sexual activity: Not on file   Other Topics Concern  . Not on file   Social History Narrative  . No narrative on file   Family History  Problem Relation Age of Onset  . Aneurysm Father 40       died of rupture  . Colon cancer Sister    O:    Component Value Date/Time   CHOL 146 11/13/2015 1601   HDL 32 (L) 11/13/2015 1601   TRIG 145 11/13/2015 1601   AST 13 (L) 02/25/2017 1632   ALT 9 (L) 02/25/2017 1632   NA 139 02/27/2017 0428   NA 142 11/13/2015 1601   K 3.3 (L) 02/27/2017 0428   CL 103 02/27/2017 0428   CO2 25 02/27/2017 0428   GLUCOSE 84 02/27/2017 0428   HGBA1C 5.4 06/09/2016 0927   HGBA1C 8.2 (H) 07/04/2012 0155   BUN 66 (H) 02/27/2017 0428   BUN 49 (H) 11/13/2015 1601   CREATININE 4.72 (H) 02/27/2017 0428   CREATININE 3.06 (H) 11/26/2014 1201   CALCIUM 7.8 (L) 02/27/2017 0428   GFRNONAA 12 (L) 02/27/2017 0428   GFRNONAA 22 (L) 11/26/2014 1201   GFRAA 14 (L) 02/27/2017 0428   GFRAA 25 (L) 11/26/2014 1201   WBC 5.6 02/26/2017 0611   HGB 9.4 (L) 02/26/2017 0611   HCT 29.0 (L) 02/26/2017 0611   PLT 243 02/26/2017 0611   TSH 2.782 07/04/2012 0142   Ht Readings from Last 2 Encounters:  03/22/17 6' (1.829 m)  03/07/17 6' (1.829 m)   Wt Readings from Last 2 Encounters:  03/22/17 228 lb (103.4 kg)  03/07/17 223 lb (101.2 kg)   There is no height or weight on file to calculate BMI. BP  Readings from Last 3 Encounters:  03/22/17 138/78  03/07/17 118/62  03/06/17 118/62     A/P:  Patient was in clinic for follow-up on professional CGM placement (placed on 8/8)   Summary of CGM results: ? 7-day average BG of 99using professional [employer] (no change since last visit) ? Within range 72% of the time ? Standard deviation 39.2 with 18 hypoglycemic events  Patient was advised to decrease insulin. Education was provided on the importance of achieving glycemic targets and goals were reviewed with patient. ? Decrease Lantus to 20 units, take in the morning with breakfast ? Keep Novalog at 7  units three times daily with meals  Patient was educated on hypoglycemia risk and what to do in times of hypoglycemia  An after visit summary was provided and patient advised to follow up in 1 weeks orif any changes in condition or questions regarding medications arise.  An after visit summary was provided and patient advised to follow up if any changes in condition or questions regarding medications arise.  The patient verbalized understanding of information provided by repeating back concepts discussed.  30 minutes spent face-to-face with the patient during the encounter. 80% of time spent on education. 20% of time was spent on coordination of care, assessment, and plan.  Danella Penton, PharmD Student

## 2017-03-30 ENCOUNTER — Ambulatory Visit (HOSPITAL_COMMUNITY)
Admission: RE | Admit: 2017-03-30 | Discharge: 2017-03-30 | Disposition: A | Discharge status: Home / Self Care | Payer: PPO | Source: Ambulatory Visit | Attending: Cardiovascular Disease | Admitting: Cardiovascular Disease

## 2017-03-30 DIAGNOSIS — R0989 Other specified symptoms and signs involving the circulatory and respiratory systems: Secondary | ICD-10-CM | POA: Diagnosis not present

## 2017-03-30 DIAGNOSIS — I6523 Occlusion and stenosis of bilateral carotid arteries: Secondary | ICD-10-CM | POA: Insufficient documentation

## 2017-03-30 NOTE — Progress Notes (Signed)
Discussed plan with pharmacy team and agree with plan.

## 2017-03-31 ENCOUNTER — Ambulatory Visit: Payer: Self-pay

## 2017-03-31 ENCOUNTER — Telehealth: Payer: Self-pay

## 2017-03-31 NOTE — Telephone Encounter (Signed)
Called/talked to pt - asked if he could come to the clinic now; stated he feels "a lot better", ate a sandwich. Informed if "light-headedness" occurs again over the w/e, go to UC or ED and give [name] a call Monday morning - stated he would.

## 2017-03-31 NOTE — Telephone Encounter (Signed)
Returned pt's call - stated he feels "light-headed". BP - 124/60 P- 59; checked his blood sugar about 1 hr ago - 158. He ate breakfast then had a snack @1215  PM and ready to eat something else.  Saw Dr [name] on 8/8. Stated he took his am meds. Please advise.

## 2017-03-31 NOTE — Telephone Encounter (Signed)
Please call patient about BP/ Heart rate.

## 2017-03-31 NOTE — Telephone Encounter (Signed)
Can he come in to [facility] today?

## 2017-03-31 NOTE — Telephone Encounter (Signed)
Thank you Glenda.. I agree with the plan

## 2017-04-04 ENCOUNTER — Ambulatory Visit (INDEPENDENT_AMBULATORY_CARE_PROVIDER_SITE_OTHER): Payer: PPO | Admitting: Pharmacist

## 2017-04-04 ENCOUNTER — Encounter: Payer: Self-pay | Admitting: Pharmacist

## 2017-04-04 ENCOUNTER — Telehealth: Payer: Self-pay | Admitting: Cardiovascular Disease

## 2017-04-04 DIAGNOSIS — Z794 Long term (current) use of insulin: Secondary | ICD-10-CM

## 2017-04-04 DIAGNOSIS — N184 Chronic kidney disease, stage 4 (severe): Secondary | ICD-10-CM

## 2017-04-04 DIAGNOSIS — Z7189 Other specified counseling: Secondary | ICD-10-CM | POA: Diagnosis not present

## 2017-04-04 DIAGNOSIS — E1122 Type 2 diabetes mellitus with diabetic chronic kidney disease: Secondary | ICD-10-CM

## 2017-04-04 DIAGNOSIS — E785 Hyperlipidemia, unspecified: Secondary | ICD-10-CM

## 2017-04-04 DIAGNOSIS — R0989 Other specified symptoms and signs involving the circulatory and respiratory systems: Secondary | ICD-10-CM

## 2017-04-04 MED ORDER — INSULIN PEN NEEDLE 32G X 4 MM MISC: 1.0000 | Freq: Every day | 3 refills | Status: DC

## 2017-04-04 MED ORDER — LIRAGLUTIDE 18 MG/3ML ~~LOC~~ SOPN: 1.2000 mg | PEN_INJECTOR | Freq: Every day | SUBCUTANEOUS | 3 refills | Status: DC

## 2017-04-04 NOTE — Patient Instructions (Signed)
Patient educated about medication as defined in this encounter and verbalized understanding by repeating back instructions provided.   

## 2017-04-04 NOTE — Telephone Encounter (Signed)
Left message for patient to call back  

## 2017-04-04 NOTE — Telephone Encounter (Signed)
New message   Pt calling and states he receieved a phone call yesterday with no message and thinks it may be for his carotid results. Requests a call back

## 2017-04-04 NOTE — Telephone Encounter (Signed)
Patient aware of carotid results. Per Dr. [name], 62-69% LICA stenosis.  F/U duplex in one year. Patient verbalized understanding and will put in recall for carotid test.

## 2017-04-04 NOTE — Progress Notes (Addendum)
S: Charles Hart is a 61 y.o. male reports to clinical pharmacist appointment for diabetes management  Allergies  Allergen Reactions  . Amlodipine Swelling  . Ivp Dye [Iodinated Diagnostic Agents] Other (See Comments)    Per patient's Nephrologist, he doesn't want the patient exposed to ANY dye because of issues with his kidneys  . Lisinopril Cough  . Red Dye Other (See Comments)    NO dye of any kind (issues with his kidneys)   Medication Sig  CARTIA XT 180 MG 24 hr capsule TAKE 1 CAPSULE(180 MG) BY MOUTH DAILY  carvedilol (COREG) 12.5 MG tablet Take 1 tablet (12.5 mg total) by mouth 2 (two) times daily.  clopidogrel (PLAVIX) 75 MG tablet Take 1 tablet (75 mg total) by mouth daily.  furosemide (LASIX) 80 MG tablet Take 1 tablet (80 mg total) by mouth 2 (two) times daily.  gabapentin (NEURONTIN) 300 MG capsule Take 300 mg by mouth at bedtime  hydrALAZINE (APRESOLINE) 100 MG tablet Take 1 tablet (100 mg total) by mouth 3 (three) times daily.  insulin aspart (NOVOLOG FLEXPEN) 100 UNIT/ML FlexPen Inject 7 Units into the skin 3 (three) times daily with meals.  Insulin Glargine (LANTUS SOLOSTAR) 100 UNIT/ML Solostar Pen Inject 30 Units into the skin at bedtime.  liraglutide (VICTOZA) 18 MG/3ML SOPN Inject 0.2 mLs (1.2 mg total) into the skin daily.  Multiple Vitamins-Minerals (ONE-A-DAY MENS 50+ ADVANTAGE PO) Take 1 tablet by mouth daily with breakfast.  pravastatin (PRAVACHOL) 40 MG tablet Take 1 tablet (40 mg total) by mouth daily.   Past Medical History:  Diagnosis Date  . Adenomatous colon polyp 07/02/2011   Last colonoscopy May 06, 2011 by Dr. Owens Loffler, who recommended repeat colonoscopy in 5 years.   . Background diabetic retinopathy 04/20/2012   Patient is followed by Dr. Katy Fitch   . Cardiomyopathy    LV function improved from 2004 to 2008.  Historically, moderately dilated LV with EF 30-40% by 2D echo 08/14/2002.  Mild CAD with severe LV dysfunction by cardiac cath  09/2002.  Normal coronary arteries and normal LV function by cardiac cath 09/19/2006.  A 2-D echo on 04/01/2009 showed mild concentric hypertrophy and normal systolic (LVEF  16-94%) and doppler C/W with grade 1 diastolic dysfunction.  . CHF (congestive heart failure) ([place])    LV function improved from 2004 to 2008.  Historically, moderately dilated LV with EF 30-40% by 2D echo 08/14/2002.  Mild CAD with severe LV dysfunction by cardiac cath 09/2002.  Normal coronary arteries and normal LV function by cardiac cath 09/19/2006.  A 2-D echo on 04/01/2009 showed mild concentric hypertrophy and normal systolic (LVEF  50-38%) and doppler C/W with grade 1 diastolic dysfunction..   . Chronic combined systolic and diastolic congestive heart failure ([place]) 05/21/2010   LV function improved from 2004 to 2008.  Historically, moderately dilated LV with EF 30-40% by 2D echo 08/14/2002.  Mild CAD with severe LV dysfunction by cardiac cath 09/2002.  Normal coronary arteries and normal LV function by cardiac cath 09/19/2006.  A 2-D echo on 04/01/2009 showed mild concentric hypertrophy and normal systolic (LVEF  88-28%) and doppler parameters consistent with abnormal left   . CVA (cerebral vascular accident) ([place]) 07/04/2012   MRI of the brain 07/04/2012 showed an acute infarct in the right basal ganglia involving the anterior putamen, anterior limb internal capsule, and head of the caudate; this measured approximately 2.5 cm in diameter.     . Dermatitis   . Diabetes mellitus  type 2  . DIABETIC PERIPHERAL NEUROPATHY 08/03/2007   Qualifier: Diagnosis of  By: Marinda Elk MD, Sonia Side    . DM neuropathy, painful ([place])   . Dyspnea   . Hearing loss in right ear   . Hyperlipidemia   . Hypertension   . Hypertensive crisis 07/28/2012  . Hypertensive urgency 08/20/2014  . Myocardial infarction ([place])   . Nephrotic syndrome 02/18/2013   A 24-hour urine collection 03/04/2013 showed total protein of 5,460 g and creatinine clearance of 80  mL/minute.  Patient was seen by Seward Meth at [place] and a repeat 24-hour urine showed 10,407 mg protein.  Patient underwent kidney biopsy on 05/30/2013; pathology showed advanced diffuse and nodular diabetic nephropathy with vascular changes consistent with long-standing difficult to control hypertension.      Social History   Social History  . Marital status: Married    Spouse name: N/A  . Number of children: N/A  . Years of education: N/A   Social History Main Topics  . Smoking status: Never Smoker  . Smokeless tobacco: Never Used  . Alcohol use No     Comment: Wine rarely.  . Drug use: No  . Sexual activity: Not Asked   Other Topics Concern  . None   Social History Narrative  . None   Family History  Problem Relation Age of Onset  . Aneurysm Father 25       died of rupture  . Colon cancer Sister    O: Component Value Date/Time   CHOL 146 11/13/2015 1601   HDL 32 (L) 11/13/2015 1601   TRIG 145 11/13/2015 1601   AST 13 (L) 02/25/2017 1632   ALT 9 (L) 02/25/2017 1632   NA 139 02/27/2017 0428   NA 142 11/13/2015 1601   K 3.3 (L) 02/27/2017 0428   CL 103 02/27/2017 0428   CO2 25 02/27/2017 0428   GLUCOSE 84 02/27/2017 0428   HGBA1C 5.4 06/09/2016 0927   HGBA1C 8.2 (H) 07/04/2012 0155   BUN 66 (H) 02/27/2017 0428   BUN 49 (H) 11/13/2015 1601   CREATININE 4.72 (H) 02/27/2017 0428   CREATININE 3.06 (H) 11/26/2014 1201   CALCIUM 7.8 (L) 02/27/2017 0428   GFRAA 14 (L) 02/27/2017 0428   GFRAA 25 (L) 11/26/2014 1201   WBC 5.6 02/26/2017 0611   HGB 9.4 (L) 02/26/2017 0611   HCT 29.0 (L) 02/26/2017 0611   PLT 243 02/26/2017 0611   TSH 2.782 07/04/2012 0142   Ht Readings from Last 2 Encounters:  03/22/17 6' (1.829 m)  03/07/17 6' (1.829 m)   Wt Readings from Last 2 Encounters:  03/22/17 228 lb (103.4 kg)  03/07/17 223 lb (101.2 kg)   There is no height or weight on file to calculate BMI. BP Readings from Last 3 Encounters:   03/22/17 138/78  03/07/17 118/62  03/06/17 118/62   A/P:  Patient was in clinic for follow-up on professional CGM placement (placed on 8/8)   Summary of CGM results: ? 7-day average BG of 92using professional [employer] (average of 99 on 03/29/17) ? Within range 67% of the time ? Hypoglycemia 30% of the time  Patient states correct insulin dosing Lantus to 20 units, take in the morning with breakfast, Novalog at 7 units three times daily with meals. Patient did not bring log of meals/insulin doses.  Discussed with attending and decided to hold insulin for now, with possibility to consider re-initiating in the future if needed  Initiate liraglutide (  Victoza) considering potential for cardio/renal benefit in addition to possible weight loss  Patient education was provided on Victoza and patient repeated back information as discussed  Called pharmacy to void insulin prescriptions  Freestyle Libre Pro CGM sensor was removed. Ordered a personal CGM for patient, but he has not yet coordinated with the company. Advised patient to continue using home BG and logging BG and meals.  An after visit summary was provided and patient advised to follow up in 1-2 weeks orif any changes in condition or questions regarding medications arise. The patientverbalizedunderstanding of information provided by.  78minutes spent face-to-face with the patient during the encounter. 80% of time spent on education. 20% of time was spent on coordination of care, assessment, and plan.

## 2017-04-05 ENCOUNTER — Telehealth: Payer: Self-pay | Admitting: *Deleted

## 2017-04-05 ENCOUNTER — Other Ambulatory Visit: Payer: Self-pay | Admitting: Licensed Clinical Social Worker

## 2017-04-05 NOTE — Telephone Encounter (Signed)
It appears from Dr Julianne Rice note yesterday that the plan is to hold insulin completely for now and to just start Victoza.

## 2017-04-05 NOTE — Patient Outreach (Signed)
[facility]) Care Management  04/05/2017  KASHIF POOLER 1955-11-28 [id number]  Assessment- CSW completed outreach call to patient and he answered. HIPPA verifications provided. Patient shares that he is doing well. He confirms that he now has both Food stamps and Medicaid as he successfully went to DSS and applied for services. Patient has a Medicaid number as well. Patient states that he now receives $65.00 in food stamps per month. Patient reports that he continues to seek stable housing. Patient has applied to live in an income based apartment complex and has already completed paperwork. He is waiting to hear back to see if he was approved so that he can relocate. He states that rent would be $225.00 per month. Patient continues to reside with his church pastor and reports being safe and stable there. He shares that he still has list of housing resources that CSW provided to him during home visit. CSW completed review of housing resources including [employer]. Patient reports that he went there but would prefer to find housing on his own because they did not deem him as being homless. He shares that if he is unable to get apartment at the place he has applied for then he has another apartment complex that he is looking at that he will apply for too. CSW reminded patient that he is also on the wait list for both [place] through [employer] but this could take up to a year. CSW questioned if he needed assistance with completing advance directives and he declined. Patient denies having any further social work needs and is very Patent attorney of social work services and assistance. Patient is agreeable to social work discharge and is agreeable to keep CSW's number in case he has any future needs or concerns.  [facility] CM Care Plan Problem One     Most Recent Value  Care Plan Problem One  Lack of overall support at this time  and lack of community resources  Role Documenting the Problem One  Clinical Social Worker  Care Plan for Problem One  Active  [facility] Long Term Goal   Patient will reapply for Medicaid within 90 days  [facility] Long Term Goal Start Date  03/01/17  [facility] Long Term Goal Met Date  04/05/17  Interventions for Problem One Long Term Goal  Goal met. Patient now has Medicaid again.  [facility] CM Short Term Goal #1   Patient will apply for food stamps within 30 days  [facility] CM Short Term Goal #1 Start Date  03/02/17  [facility] CM Short Term Goal #1 Met Date  04/05/17  Interventions for Short Term Goal #1  Goal met. Patient now receives $65.00 in food stamps per month     Plan-CSW will complete social work discharge as all goals have been met. CSW will update [facility] RNCM of discharge and notify PCP as well.  Eula Fried, BSW, MSW, [email or web address] Phone: [phone] Fax: [phone]

## 2017-04-05 NOTE — Telephone Encounter (Signed)
Called pt, informed him to continue victoza

## 2017-04-05 NOTE — Telephone Encounter (Signed)
Pt calls and states that his fasting blood sugar this am was 113, he has only had a sandwich today. He states he is suppose to check his bloodsugar at least 3 times daily but only checks it one time daily, he checked it just now and was 110. He has NOT taken any insulin today. Wants to know should he take the insulin? Please advise

## 2017-04-06 ENCOUNTER — Other Ambulatory Visit: Payer: Self-pay

## 2017-04-06 NOTE — Patient Outreach (Signed)
[facility]) Care Management  04/06/2017   Charles Hart Sep 14, 1955 [id number]  Subjective:  I have been approved for medicaid and food stamps. I am waiting to hear about the apartment I applied for.  Objective:  Patient recently separated from his wife of many years. Patient has history of heart failure  Current Medications:  Current Outpatient Prescriptions  Medication Sig Dispense Refill  . CARTIA XT 180 MG 24 hr capsule TAKE 1 CAPSULE(180 MG) BY MOUTH DAILY 90 capsule 3  . carvedilol (COREG) 12.5 MG tablet Take 1 tablet (12.5 mg total) by mouth 2 (two) times daily. 60 tablet 6  . clopidogrel (PLAVIX) 75 MG tablet Take 1 tablet (75 mg total) by mouth daily. 90 tablet 3  . furosemide (LASIX) 80 MG tablet Take 1 tablet (80 mg total) by mouth 2 (two) times daily. 180 tablet 1  . gabapentin (NEURONTIN) 300 MG capsule Take 300 mg by mouth at bedtime 90 capsule 3  . hydrALAZINE (APRESOLINE) 100 MG tablet Take 1 tablet (100 mg total) by mouth 3 (three) times daily. 270 tablet 3  . Insulin Pen Needle (NOVOFINE PLUS) 32G X 4 MM MISC 1 Dose by Does not apply route daily. For Victoza, substitution ok for cheaper brand 30 each 3  . liraglutide (VICTOZA) 18 MG/3ML SOPN Inject 0.2 mLs (1.2 mg total) into the skin daily. 6 mL 3  . Multiple Vitamins-Minerals (ONE-A-DAY MENS 50+ ADVANTAGE PO) Take 1 tablet by mouth daily with breakfast.    . pravastatin (PRAVACHOL) 40 MG tablet Take 1 tablet (40 mg total) by mouth daily. 90 tablet 3   No current facility-administered medications for this visit.     Functional Status:  In your present state of health, do you have any difficulty performing the following activities: 03/07/2017 02/26/2017  Hearing? N Y  Comment - Difficulty that started about 5 years ago per pt  Vision? Y N  Difficulty concentrating or making decisions? N N  Walking or climbing stairs? Y N  Dressing or bathing? N N  Doing errands, shopping? N N  Preparing  Food and eating ? N -  Using the Toilet? N -  In the past six months, have you accidently leaked urine? N -  Do you have problems with loss of bowel control? N -  Managing your Medications? N -  Managing your Finances? N -  Housekeeping or managing your Housekeeping? N -  Some recent data might be hidden    Fall/Depression Screening: Fall Risk  03/07/2017 09/15/2016 06/09/2016  Falls in the past year? No No No  Number falls in past yr: - - -  Injury with Fall? - - -  Risk for fall due to : Impaired vision - -  Risk for fall due to: Comment - - -  Follow up - - -   PHQ 2/9 Scores 03/07/2017 03/02/2017 09/15/2016 06/09/2016 03/11/2016 03/03/2016 02/26/2016  PHQ - 2 Score 0 1 0 0 0 0 0   [facility] CM Care Plan Problem One     Most Recent Value  Care Plan Problem One  Lack of overall support at this time and lack of community resources  Role Documenting the Problem One  Clinical Social Worker  Care Plan for Problem One  Active  [facility] Long Term Goal   Patient will reapply for Medicaid within 90 days  [facility] Long Term Goal Start Date  03/01/17  [facility] Long Term Goal Met Date  04/06/17  Interventions for Problem One  Long Term Goal  Goal met. Patient now has Medicaid again.  [facility] CM Short Term Goal #1   Patient will apply for food stamps within 30 days  [facility] CM Short Term Goal #1 Start Date  03/02/17  [facility] CM Short Term Goal #1 Met Date  04/06/17  Interventions for Short Term Goal #1  Goal met. Patient now receives $65.00 in food stamps per month     Assessment:  Patient continues to progress towards meeting his case management goals.  Plan:  Telephone contact in the next 28 days for community care coordination.

## 2017-04-11 ENCOUNTER — Telehealth: Payer: Self-pay | Admitting: Pharmacist

## 2017-04-11 ENCOUNTER — Inpatient Hospital Stay: Admission: RE | Admit: 2017-04-11 | Payer: PPO | Source: Ambulatory Visit

## 2017-04-11 NOTE — Telephone Encounter (Signed)
Great, thank you!

## 2017-04-11 NOTE — Progress Notes (Signed)
Patient called to clarify increasing liraglutide to 1.2 mg daily starting tomorrow. Provided education on dosing, patient reports no side effects after starting liraglutide last week and home BG have been in the 130s. Appointment scheduled 04/18/17 and confirmed with patient. Advised him to contact clinic if any questions or concerns.

## 2017-04-18 ENCOUNTER — Ambulatory Visit: Payer: PPO | Admitting: Pharmacist

## 2017-04-27 ENCOUNTER — Other Ambulatory Visit: Payer: PPO

## 2017-04-28 ENCOUNTER — Other Ambulatory Visit: Payer: Self-pay

## 2017-04-28 ENCOUNTER — Ambulatory Visit: Payer: Self-pay

## 2017-04-28 ENCOUNTER — Telehealth: Payer: Self-pay | Admitting: *Deleted

## 2017-04-28 NOTE — Patient Outreach (Signed)
[facility]) Care Management  04/28/2017  Charles Hart 12-Sep-1955 [id number]  Subjective: "I am doing pretty good. I got a lot of stress off of me".  Objective: none-telephonic call.  Assessment: 61 year old with history of heart failure, diabetes, hyperlipidemia, HTN, OSA, CKD, hearing loss right ear.  RNCM received referral on 04/28/17. Charles Hart reports he is doing well.  Recent move: per chart, Recent separation from wife. He reports he has moved to [place], but states he is going to continue with his current doctors. Charles Hart reports he drives therefore he has transportation.   Missed appointment: client reports he is scheduled for an appointment today, but is not going to be able to make it due to the weather. RNCM noted missed appointment on 04/18/17. Client states he is going to call to reschedule his appointment today.   History of heart failure. Client reports he picked up his scales from [facility] [employer] office. He states he is weighing every day. Client states his weights are stable at 216-217 pounds. RNCM reinforced parameters for when to call the doctor-weight gain of more than three pounds in a day or 5 pounds in a week.   Life line services-Client reports he is alone now and is interested in lifeline services.   Plan: RNCM will refer to [facility] who services the [place] area.  [name], RN, MSN, [place] Coordinator Cell: [phone]

## 2017-04-28 NOTE — Patient Outreach (Signed)
  [facility]) Care Management  04/28/2017  Charles Hart [birth year] [id number]   Care coordination  Reassignment of Referrals           [facility] Cm received referral from [place] since Charles Hart has now relocated to [facility])   "I just assessed Charles. Hart and he now lives in [place]. Can you change address to the following:   [address]  [place] [address]. "  [facility] CM reviewed Charles Hart last [facility] CM note indicating follow up within 28 days from 04/06/17 (05/04/17) plus the contact assessment on 04/28/17 form [name] was meeting his [facility] goals and may need further resources    [name] at his home number to include this [facility] CM mobile number.   Plans Pending a return call  [facility] CM will attempt to contact Charles Hart again next week if no return call today for re assignment [facility] CM services, introduction, assessment andw follow up    [place]. Lavina Hamman, RN, BSN, [place] Care Management [phone]

## 2017-05-03 ENCOUNTER — Encounter (HOSPITAL_COMMUNITY): Payer: Self-pay

## 2017-05-03 ENCOUNTER — Emergency Department (HOSPITAL_COMMUNITY): Payer: PPO

## 2017-05-03 ENCOUNTER — Emergency Department (HOSPITAL_COMMUNITY)
Admission: EM | Admit: 2017-05-03 | Discharge: 2017-05-03 | Disposition: A | Discharge status: Home / Self Care | Payer: PPO | Attending: Emergency Medicine | Admitting: Emergency Medicine

## 2017-05-03 DIAGNOSIS — N2889 Other specified disorders of kidney and ureter: Secondary | ICD-10-CM | POA: Insufficient documentation

## 2017-05-03 DIAGNOSIS — Z7901 Long term (current) use of anticoagulants: Secondary | ICD-10-CM | POA: Diagnosis not present

## 2017-05-03 DIAGNOSIS — I130 Hypertensive heart and chronic kidney disease with heart failure and stage 1 through stage 4 chronic kidney disease, or unspecified chronic kidney disease: Secondary | ICD-10-CM | POA: Insufficient documentation

## 2017-05-03 DIAGNOSIS — I5042 Chronic combined systolic (congestive) and diastolic (congestive) heart failure: Secondary | ICD-10-CM | POA: Insufficient documentation

## 2017-05-03 DIAGNOSIS — E1122 Type 2 diabetes mellitus with diabetic chronic kidney disease: Secondary | ICD-10-CM | POA: Insufficient documentation

## 2017-05-03 DIAGNOSIS — R1110 Vomiting, unspecified: Secondary | ICD-10-CM | POA: Diagnosis not present

## 2017-05-03 DIAGNOSIS — R112 Nausea with vomiting, unspecified: Secondary | ICD-10-CM | POA: Insufficient documentation

## 2017-05-03 DIAGNOSIS — R1111 Vomiting without nausea: Secondary | ICD-10-CM | POA: Diagnosis not present

## 2017-05-03 DIAGNOSIS — Z79899 Other long term (current) drug therapy: Secondary | ICD-10-CM | POA: Insufficient documentation

## 2017-05-03 DIAGNOSIS — Z794 Long term (current) use of insulin: Secondary | ICD-10-CM | POA: Insufficient documentation

## 2017-05-03 DIAGNOSIS — N184 Chronic kidney disease, stage 4 (severe): Secondary | ICD-10-CM | POA: Diagnosis not present

## 2017-05-03 DIAGNOSIS — I10 Essential (primary) hypertension: Secondary | ICD-10-CM | POA: Diagnosis not present

## 2017-05-03 LAB — URINALYSIS, ROUTINE W REFLEX MICROSCOPIC
BACTERIA UA: NONE SEEN
Bilirubin Urine: NEGATIVE
GLUCOSE, UA: NEGATIVE mg/dL
Hgb urine dipstick: NEGATIVE
Ketones, ur: NEGATIVE mg/dL
Leukocytes, UA: NEGATIVE
Nitrite: NEGATIVE
PH: 7 (ref 5.0–8.0)
Protein, ur: >=300 mg/dL — AB
SQUAMOUS EPITHELIAL / LPF: NONE SEEN
Specific Gravity, Urine: 1.012 (ref 1.005–1.030)

## 2017-05-03 LAB — COMPREHENSIVE METABOLIC PANEL
ALBUMIN: 4.6 g/dL (ref 3.5–5.0)
ALK PHOS: 56 U/L (ref 38–126)
ALT: 11 U/L — ABNORMAL LOW (ref 17–63)
AST: 20 U/L (ref 15–41)
Anion gap: 16 — ABNORMAL HIGH (ref 5–15)
BUN: 67 mg/dL — AB (ref 6–20)
CHLORIDE: 100 mmol/L — AB (ref 101–111)
CO2: 23 mmol/L (ref 22–32)
CREATININE: 4.79 mg/dL — AB (ref 0.61–1.24)
Calcium: 8.3 mg/dL — ABNORMAL LOW (ref 8.9–10.3)
GFR calc non Af Amer: 12 mL/min — ABNORMAL LOW (ref >60)
GFR, EST AFRICAN AMERICAN: 14 mL/min — AB (ref >60)
GLUCOSE: 82 mg/dL (ref 65–99)
Potassium: 3.4 mmol/L — ABNORMAL LOW (ref 3.5–5.1)
Sodium: 139 mmol/L (ref 135–145)
Total Bilirubin: 1.1 mg/dL (ref 0.3–1.2)
Total Protein: 8.2 g/dL — ABNORMAL HIGH (ref 6.5–8.1)

## 2017-05-03 LAB — CBC
HCT: 34.5 % — ABNORMAL LOW (ref 39.0–52.0)
Hemoglobin: 11.7 g/dL — ABNORMAL LOW (ref 13.0–17.0)
MCH: 28.3 pg (ref 26.0–34.0)
MCHC: 33.9 g/dL (ref 30.0–36.0)
MCV: 83.3 fL (ref 78.0–100.0)
PLATELETS: 261 10*3/uL (ref 150–400)
RBC: 4.14 MIL/uL — ABNORMAL LOW (ref 4.22–5.81)
RDW: 12.4 % (ref 11.5–15.5)
WBC: 4.5 10*3/uL (ref 4.0–10.5)

## 2017-05-03 LAB — CBG MONITORING, ED: Glucose-Capillary: 81 mg/dL (ref 65–99)

## 2017-05-03 LAB — TROPONIN I: TROPONIN I: 0.03 ng/mL — AB (ref <0.03)

## 2017-05-03 LAB — LIPASE, BLOOD: LIPASE: 83 U/L — AB (ref 11–51)

## 2017-05-03 MED ORDER — ONDANSETRON HCL 4 MG/2ML IJ SOLN
4.0000 mg | INTRAMUSCULAR | Status: DC | PRN
  Administered 2017-05-03: 4 mg via INTRAVENOUS
  Filled 2017-05-03: qty 2

## 2017-05-03 MED ORDER — POTASSIUM CHLORIDE CRYS ER 20 MEQ PO TBCR
40.0000 meq | EXTENDED_RELEASE_TABLET | Freq: Once | ORAL | Status: AC
  Administered 2017-05-03: 40 meq via ORAL
  Filled 2017-05-03: qty 2

## 2017-05-03 MED ORDER — SODIUM CHLORIDE 0.9 % IV BOLUS (SEPSIS)
500.0000 mL | Freq: Once | INTRAVENOUS | Status: AC
  Administered 2017-05-03: 500 mL via INTRAVENOUS

## 2017-05-03 MED ORDER — ONDANSETRON 4 MG PO TBDP: 4.0000 mg | ORAL_TABLET | Freq: Three times a day (TID) | ORAL | 0 refills | Status: DC | PRN

## 2017-05-03 MED ORDER — SODIUM CHLORIDE 0.9 % IV SOLN
INTRAVENOUS | Status: DC
  Administered 2017-05-03: 100 mL/h via INTRAVENOUS

## 2017-05-03 NOTE — ED Provider Notes (Signed)
[facility] DEPT Provider Note   CSN: [id number] Arrival date & time: 05/03/17  1319     History   Chief Complaint Chief Complaint  Patient presents with  . Emesis    HPI Charles Hart is a 61 y.o. male.  HPI  Pt was seen at 1605. Per pt, c/o gradual onset and persistence of multiple intermittent episodes of N/V that began this morning at 0830. Pt has been unable to tol PO due to N/V.  Denies diarrhea, no CP/SOB, no back pain, no fevers, no black or blood in stools or emesis.    Past Medical History:  Diagnosis Date  . Adenomatous colon polyp 07/02/2011   Last colonoscopy May 06, 2011 by Dr. Owens Loffler, who recommended repeat colonoscopy in 5 years.   . Background diabetic retinopathy 04/20/2012   Patient is followed by Dr. Katy Fitch   . Cardiomyopathy    LV function improved from 2004 to 2008.  Historically, moderately dilated LV with EF 30-40% by 2D echo 08/14/2002.  Mild CAD with severe LV dysfunction by cardiac cath 09/2002.  Normal coronary arteries and normal LV function by cardiac cath 09/19/2006.  A 2-D echo on 04/01/2009 showed mild concentric hypertrophy and normal systolic (LVEF  52-77%) and doppler C/W with grade 1 diastolic dysfunction.  . CHF (congestive heart failure) ([place])    LV function improved from 2004 to 2008.  Historically, moderately dilated LV with EF 30-40% by 2D echo 08/14/2002.  Mild CAD with severe LV dysfunction by cardiac cath 09/2002.  Normal coronary arteries and normal LV function by cardiac cath 09/19/2006.  A 2-D echo on 04/01/2009 showed mild concentric hypertrophy and normal systolic (LVEF  82-42%) and doppler C/W with grade 1 diastolic dysfunction..   . Chronic combined systolic and diastolic congestive heart failure ([place]) 05/21/2010   LV function improved from 2004 to 2008.  Historically, moderately dilated LV with EF 30-40% by 2D echo 08/14/2002.  Mild CAD with severe LV dysfunction by cardiac cath 09/2002.  Normal coronary arteries and  normal LV function by cardiac cath 09/19/2006.  A 2-D echo on 04/01/2009 showed mild concentric hypertrophy and normal systolic (LVEF  35-36%) and doppler parameters consistent with abnormal left   . CVA (cerebral vascular accident) ([place]) 07/04/2012   MRI of the brain 07/04/2012 showed an acute infarct in the right basal ganglia involving the anterior putamen, anterior limb internal capsule, and head of the caudate; this measured approximately 2.5 cm in diameter.     . Dermatitis   . Diabetes mellitus    type 2  . DIABETIC PERIPHERAL NEUROPATHY 08/03/2007   Qualifier: Diagnosis of  By: Marinda Elk MD, Sonia Side    . DM neuropathy, painful ([place])   . Dyspnea   . Hearing loss in right ear   . Hyperlipidemia   . Hypertension   . Hypertensive crisis 07/28/2012  . Hypertensive urgency 08/20/2014  . Myocardial infarction ([place])   . Nephrotic syndrome 02/18/2013   A 24-hour urine collection 03/04/2013 showed total protein of 5,460 g and creatinine clearance of 80 mL/minute.  Patient was seen by Seward Meth at [place] and a repeat 24-hour urine showed 10,407 mg protein.  Patient underwent kidney biopsy on 05/30/2013; pathology showed advanced diffuse and nodular diabetic nephropathy with vascular changes consistent with long-standing difficult to control hypertension.       Patient Active Problem List   Diagnosis Date Noted  . Acute on chronic combined systolic and diastolic CHF (congestive heart failure) ([place])  02/25/2017  . Piriformis syndrome of right side 03/03/2016  . Muscle spasm of left shoulder area 03/03/2016  . Secondary hyperparathyroidism, renal ([place]) 02/09/2016  . Primary osteoarthritis of left knee 01/07/2016  . Low back pain 01/07/2016  . Joint stiffness 11/13/2015  . Age-related nuclear cataract of both eyes 10/07/2015  . Normocytic anemia 08/22/2014  . CKD stage 4 due to type 2 diabetes mellitus ([place]) 06/03/2014  . Health care maintenance 05/16/2014  .  Nephrotic syndrome 02/18/2013  . Obstructive sleep apnea 07/26/2012  . History of stroke 07/04/2012  . Controlled type 2 diabetes mellitus with both eyes affected by proliferative retinopathy without macular edema, with long-term current use of insulin ([place]) 04/20/2012  . ED (erectile dysfunction) 04/18/2012  . Adenomatous colon polyp 07/02/2011  . Chronic combined systolic and diastolic congestive heart failure ([place]) 05/21/2010  . Carotid bruit 04/01/2009  . Diabetic peripheral neuropathy associated with type 2 diabetes mellitus ([place]) 08/03/2007  . HEARING LOSS, RIGHT EAR 07/30/2007  . DM (diabetes mellitus), type 2 with renal complications ([place]) 20/25/4270  . Hyperlipidemia 06/01/2006  . Essential hypertension 06/01/2006    Past Surgical History:  Procedure Laterality Date  . CARDIAC CATHETERIZATION     3 times  . COLONOSCOPY    . FOOT SURGERY    . POLYPECTOMY         Home Medications    Prior to Admission medications   Medication Sig Start Date End Date Taking? Authorizing Provider  CARTIA XT 180 MG 24 hr capsule TAKE 1 CAPSULE(180 MG) BY MOUTH DAILY 11/16/16   Lucious Groves, DO  carvedilol (COREG) 12.5 MG tablet Take 1 tablet (12.5 mg total) by mouth 2 (two) times daily. 08/16/16   Lucious Groves, DO  clopidogrel (PLAVIX) 75 MG tablet Take 1 tablet (75 mg total) by mouth daily. 01/17/17   Lucious Groves, DO  furosemide (LASIX) 80 MG tablet Take 1 tablet (80 mg total) by mouth 2 (two) times daily. 01/17/17   Lucious Groves, DO  gabapentin (NEURONTIN) 300 MG capsule Take 300 mg by mouth at bedtime 11/16/16   Lucious Groves, DO  hydrALAZINE (APRESOLINE) 100 MG tablet Take 1 tablet (100 mg total) by mouth 3 (three) times daily. 11/16/16 11/25/17  Lucious Groves, DO  Insulin Pen Needle (NOVOFINE PLUS) 32G X 4 MM MISC 1 Dose by Does not apply route daily. For Victoza, substitution ok for cheaper brand 04/04/17   Joni Reining C, DO  liraglutide (VICTOZA) 18 MG/3ML SOPN Inject 0.2 mLs  (1.2 mg total) into the skin daily. 04/04/17   Bartholomew Crews, MD  Multiple Vitamins-Minerals (ONE-A-DAY MENS 50+ ADVANTAGE PO) Take 1 tablet by mouth daily with breakfast.    [provider]  pravastatin (PRAVACHOL) 40 MG tablet Take 1 tablet (40 mg total) by mouth daily. 01/17/17   Lucious Groves, DO    Family History Family History  Problem Relation Age of Onset  . Aneurysm Father 32       died of rupture  . Colon cancer Sister     Social History Social History  Substance Use Topics  . Smoking status: Never Smoker  . Smokeless tobacco: Never Used  . Alcohol use No     Comment: Wine rarely.     Allergies   Ivp dye [iodinated diagnostic agents]; Amlodipine; Lisinopril; and Red dye   Review of Systems Review of Systems ROS: Statement: All systems negative except as marked or noted in the HPI; Constitutional: Negative  for fever and chills. ; ; Eyes: Negative for eye pain, redness and discharge. ; ; ENMT: Negative for ear pain, hoarseness, nasal congestion, sinus pressure and sore throat. ; ; Cardiovascular: Negative for chest pain, palpitations, diaphoresis, dyspnea and peripheral edema. ; ; Respiratory: Negative for cough, wheezing and stridor. ; ; Gastrointestinal: +N/V. Negative for diarrhea, abdominal pain, blood in stool, hematemesis, jaundice and rectal bleeding. . ; ; Genitourinary: Negative for dysuria, flank pain and hematuria. ; ; Musculoskeletal: Negative for back pain and neck pain. Negative for swelling and trauma.; ; Skin: Negative for pruritus, rash, abrasions, blisters, bruising and skin lesion.; ; Neuro: Negative for headache, lightheadedness and neck stiffness. Negative for weakness, altered level of consciousness, altered mental status, extremity weakness, paresthesias, involuntary movement, seizure and syncope.       Physical Exam Updated Vital Signs BP (!) 178/93 (BP Location: Right Arm)   Pulse 86   Temp 97.9 F (36.6 C) (Oral)   Resp 20   Ht  6' (1.829 m)   Wt 98.4 kg (217 lb)   SpO2 99%   BMI 29.43 kg/m   Physical Exam 1610: Physical examination:  Nursing notes reviewed; Vital signs and O2 SAT reviewed;  Constitutional: Well developed, Well nourished, Well hydrated, In no acute distress; Head:  Normocephalic, atraumatic; Eyes: EOMI, PERRL, No scleral icterus; ENMT: Mouth and pharynx normal, Mucous membranes moist; Neck: Supple, Full range of motion, No lymphadenopathy; Cardiovascular: Regular rate and rhythm, No gallop; Respiratory: Breath sounds clear & equal bilaterally, No wheezes.  Speaking full sentences with ease, Normal respiratory effort/excursion; Chest: Nontender, Movement normal; Abdomen: Soft, +mild diffuse tenderness to palp. No rebound or guarding. Nondistended, Normal bowel sounds; Genitourinary: No CVA tenderness; Extremities: Pulses normal, No tenderness, No edema, No calf edema or asymmetry.; Neuro: AA&Ox3, Major CN grossly intact.  Speech clear. No gross focal motor or sensory deficits in extremities.; Skin: Color normal, Warm, Dry.   ED Treatments / Results  Labs (all labs ordered are listed, but only abnormal results are displayed)   EKG  EKG Interpretation  Date/Time:  Wednesday May 03 2017 17:40:37 EDT Ventricular Rate:  78 PR Interval:    QRS Duration: 117 QT Interval:  459 QTC Calculation: 523 R Axis:   20 Text Interpretation:  Sinus rhythm Probable left atrial enlargement LVH with IVCD and secondary repol abnrm Prolonged QT interval When compared with ECG of 02/26/2017 No significant change was found Confirmed by Francine Graven [phone]) on 05/03/2017 6:05:00 PM       Radiology   Procedures Procedures (including critical care time)  Medications Ordered in ED Medications  0.9 %  sodium chloride infusion (not administered)  sodium chloride 0.9 % bolus 500 mL (not administered)  ondansetron (ZOFRAN) injection 4 mg (not administered)     Initial Impression / Assessment and Plan / ED  Course  I have reviewed the triage vital signs and the nursing notes.  Pertinent labs & imaging results that were available during my care of the patient were reviewed by me and considered in my medical decision making (see chart for details).  MDM Reviewed: previous chart, nursing note and vitals Reviewed previous: ECG and labs Interpretation: labs, ECG, x-ray and CT scan    Results for orders placed or performed during the hospital encounter of 05/03/17  Lipase, blood  Result Value Ref Range   Lipase 83 (H) 11 - 51 U/L  Comprehensive metabolic panel  Result Value Ref Range   Sodium 139 135 - 145 mmol/L  Potassium 3.4 (L) 3.5 - 5.1 mmol/L   Chloride 100 (L) 101 - 111 mmol/L   CO2 23 22 - 32 mmol/L   Glucose, Bld 82 65 - 99 mg/dL   BUN 67 (H) 6 - 20 mg/dL   Creatinine, Ser 4.79 (H) 0.61 - 1.24 mg/dL   Calcium 8.3 (L) 8.9 - 10.3 mg/dL   Total Protein 8.2 (H) 6.5 - 8.1 g/dL   Albumin 4.6 3.5 - 5.0 g/dL   AST 20 15 - 41 U/L   ALT 11 (L) 17 - 63 U/L   Alkaline Phosphatase 56 38 - 126 U/L   Total Bilirubin 1.1 0.3 - 1.2 mg/dL   GFR calc non Af Amer 12 (L) >60 mL/min   GFR calc Af Amer 14 (L) >60 mL/min   Anion gap 16 (H) 5 - 15  CBC  Result Value Ref Range   WBC 4.5 4.0 - 10.5 K/uL   RBC 4.14 (L) 4.22 - 5.81 MIL/uL   Hemoglobin 11.7 (L) 13.0 - 17.0 g/dL   HCT 34.5 (L) 39.0 - 52.0 %   MCV 83.3 78.0 - 100.0 fL   MCH 28.3 26.0 - 34.0 pg   MCHC 33.9 30.0 - 36.0 g/dL   RDW 12.4 11.5 - 15.5 %   Platelets 261 150 - 400 K/uL  Urinalysis, Routine w reflex microscopic  Result Value Ref Range   Color, Urine YELLOW YELLOW   APPearance CLEAR CLEAR   Specific Gravity, Urine 1.012 1.005 - 1.030   pH 7.0 5.0 - 8.0   Glucose, UA NEGATIVE NEGATIVE mg/dL   Hgb urine dipstick NEGATIVE NEGATIVE   Bilirubin Urine NEGATIVE NEGATIVE   Ketones, ur NEGATIVE NEGATIVE mg/dL   Protein, ur >=300 (A) NEGATIVE mg/dL   Nitrite NEGATIVE NEGATIVE   Leukocytes, UA NEGATIVE NEGATIVE   RBC / HPF 0-5  0 - 5 RBC/hpf   WBC, UA 0-5 0 - 5 WBC/hpf   Bacteria, UA NONE SEEN NONE SEEN   Squamous Epithelial / LPF NONE SEEN NONE SEEN  Troponin I  Result Value Ref Range   Troponin I 0.03 (HH) <0.03 ng/mL  POC CBG, ED  Result Value Ref Range   Glucose-Capillary 81 65 - 99 mg/dL   Ct Abdomen Pelvis Wo Contrast Result Date: 05/03/2017 CLINICAL DATA:  Acute nausea and vomiting EXAM: CT ABDOMEN AND PELVIS WITHOUT CONTRAST TECHNIQUE: Multidetector CT imaging of the abdomen and pelvis was performed following the standard protocol without IV contrast. COMPARISON:  10/23/2016, 01/15/2017 FINDINGS: Lower chest: No acute abnormality. Hepatobiliary: Stable indeterminate 16 mm hypodensity in the posterior right liver, better characterized by the comparison MR. No intrahepatic biliary dilatation. Gallbladder and biliary system unremarkable for noncontrast imaging. Pancreas: Unremarkable. No pancreatic ductal dilatation or surrounding inflammatory changes. Spleen: Normal in size without focal abnormality. Adrenals/Urinary Tract: Normal adrenal glands. No renal obstruction, hydronephrosis, hydroureter, or obstructing ureteral calculus. Bladder is underdistended. Renal lesions by MRI are not as well demonstrated without contrast. The dominant left renal mass in the lower pole does appear slightly larger measuring 5.5 x 4.1 cm, previously 3.6 x 4.8 cm. This lesion is concerning for underlying renal cell carcinoma when compared to the prior MRI. Stomach/Bowel: Negative for bowel obstruction, significant dilatation, ileus, or free air. Normal appendix demonstrated. Scattered colonic diverticulosis. No acute inflammatory process. No fluid collection or abscess. Vascular/Lymphatic: Negative for aneurysm. Atherosclerotic changes noted. No adenopathy. Reproductive: Prominent prostate gland. Seminal vesicles are symmetric. No acute finding by noncontrast imaging. Other: No inguinal or abdominal hernia.  Musculoskeletal: Degenerative  changes noted of the spine. It osseous finding. IMPRESSION: No acute finding in the abdomen and pelvis by noncontrast imaging. Slight enlargement of the largest left renal mass measuring up to 5.5 x 4.1 cm by noncontrast CT. This lesion appears concerning for renal cell carcinoma with compared to the MRI without contrast. Other hepatic and renal lesions are grossly stable by noncontrast imaging. Colonic diverticulosis Abdominal atherosclerosis Electronically Signed   By: Jerilynn Mages.  Shick M.D.   On: 05/03/2017 17:01   Dg Chest 2 View Result Date: 05/03/2017 CLINICAL DATA:  started vomiting approx 0830 this morning. -- vomiting clear liquid. States he cannot hold liquids down. Pt has vomited x 4 EXAM: CHEST  2 VIEW COMPARISON:  Chest x-rays dated 02/25/2017 and 02/17/2016. FINDINGS: Heart size and mediastinal contours are stable. Lungs are clear. No pleural effusion or pneumothorax seen. Degenerative spurring again noted within the thoracic spine, mild to moderate in degree. No acute or suspicious osseous finding. IMPRESSION: No active cardiopulmonary disease. No evidence of pneumonia or pulmonary edema. Electronically Signed   By: Franki Cabot M.D.   On: 05/03/2017 16:45     1935:  Labs per baseline. CT with known renal mass. Pt has tol PO well while in the ED without N/V.  No stooling while in the ED.  Abd benign, VSS. Feels better and wants to go home now.  Tx symptomatically at this time. Dx and testing d/w pt.  Questions answered.  Verb understanding, agreeable to d/c home with outpt f/u.      Final Clinical Impressions(s) / ED Diagnoses   Final diagnoses:  None    New Prescriptions New Prescriptions   No medications on file     Francine Graven, DO 05/08/17 1210

## 2017-05-03 NOTE — ED Notes (Signed)
Pt denies need for Zofran at this time.

## 2017-05-03 NOTE — ED Notes (Signed)
Pt tolerating po fluids at this time

## 2017-05-03 NOTE — Discharge Instructions (Signed)
Take the prescription as directed.  Increase your fluid intake (ie:  Gatoraide) for the next few days, as discussed.  Eat a bland diet and advance to your regular diet slowly as you can tolerate it.  Call your regular medical doctor tomorrow to schedule a follow up appointment in the next 3 days.  Return to the Emergency Department immediately if not improving (or even worsening) despite taking the medicines as prescribed, any black or bloody stool or vomit, if you develop a fever over "101," or for any other concerns.

## 2017-05-03 NOTE — ED Triage Notes (Signed)
Pt reports that he he started vomiting approx 0830 this morning. Pt denies abdominal pain and diarrhea. Pt states he is vomiting clear liquid. States he cannot hold liquids down. Pt has vomited x 4

## 2017-05-04 ENCOUNTER — Telehealth: Payer: Self-pay | Admitting: *Deleted

## 2017-05-04 MED ORDER — PROMETHAZINE HCL 25 MG PO TABS: 25.0000 mg | ORAL_TABLET | Freq: Four times a day (QID) | ORAL | 0 refills | Status: DC | PRN

## 2017-05-04 NOTE — Patient Outreach (Signed)
Charles Hart) Care Management  05/04/2017  Charles Hart Jan 04, 1956 [id number]   Care Coordination   [facility] CM left voice message at his mobile number which is the only number listed for him in EPIC for including [facility] CM mobile number for a return call.  [facility] CM attempting to schedule [facility] CM home visit    Plans:   [facility] CM will attempt to contact again this week   [place]ber[place] L. Lavina Hamman, RN, BSN, [place] Care Management [phone]

## 2017-05-04 NOTE — ED Provider Notes (Signed)
Pt returned this am with zofran script stating cannot afford and not covered by his insurance.  He was given phenergan 25 mg #12 prescription in its place.   Evalee Jefferson, PA-C 05/04/17 [place], [place], DO 05/08/17 1211

## 2017-05-05 ENCOUNTER — Telehealth: Payer: Self-pay | Admitting: *Deleted

## 2017-05-05 NOTE — Patient Outreach (Signed)
  [place] [facility]) Care Management  05/05/2017  Charles Hart 09/08/1955 [id number]   THis [facility] CM telephone note opened in error   [name], RN, BSN, [place] Care Management [phone]

## 2017-06-09 ENCOUNTER — Telehealth: Payer: Self-pay | Admitting: *Deleted

## 2017-06-09 NOTE — Patient Outreach (Signed)
[facility]) Care Management  06/09/2017  TRACEY STEWART [birth year] [id number]   Care coordination   Unsuccessful outreach letter sent after no responses from Mr Makarewicz after 3 voice messages left by [facility] CM  Plans Pend out for 10 business days pending response   Kimberly L. Lavina Hamman, RN, BSN, [place] Care Management [phone]

## 2017-06-30 ENCOUNTER — Telehealth: Payer: Self-pay | Admitting: *Deleted

## 2017-06-30 NOTE — Patient Outreach (Signed)
[facility]) Care Management  06/30/2017  TUCKER MINTER 1955-12-28 [id number]   Case closure/care coordination  Mr Fjelstad agreed to participate with another [facility] CM who assessed him and transferred him to this Cm related him relocating to [facility]. He did not respond to follow-up call attempts/contacts nor the [facility] outreach letter after 10 + business days per [facility] work flow from this [facility] CM.  Epic indicates Mr Chavarin has not been in hospital, assisted living facility nor SNF since July 2018 .     Plans case closure for Consumer has withdrawn from program  Letters sent to Mr [employer] and his pcp, Dr Heber Manzanola [facility] CMA updated via in basket  [place]. Lavina Hamman, RN, BSN, [place] Care Management [phone]

## 2017-07-01 ENCOUNTER — Other Ambulatory Visit: Payer: Self-pay

## 2017-07-01 ENCOUNTER — Encounter (HOSPITAL_COMMUNITY): Payer: Self-pay | Admitting: Emergency Medicine

## 2017-07-01 ENCOUNTER — Emergency Department (HOSPITAL_COMMUNITY)
Admission: EM | Admit: 2017-07-01 | Discharge: 2017-07-01 | Disposition: A | Discharge status: Home / Self Care | Payer: PPO | Attending: Emergency Medicine | Admitting: Emergency Medicine

## 2017-07-01 DIAGNOSIS — E1140 Type 2 diabetes mellitus with diabetic neuropathy, unspecified: Secondary | ICD-10-CM | POA: Insufficient documentation

## 2017-07-01 DIAGNOSIS — E11319 Type 2 diabetes mellitus with unspecified diabetic retinopathy without macular edema: Secondary | ICD-10-CM | POA: Insufficient documentation

## 2017-07-01 DIAGNOSIS — Z79899 Other long term (current) drug therapy: Secondary | ICD-10-CM | POA: Insufficient documentation

## 2017-07-01 DIAGNOSIS — I130 Hypertensive heart and chronic kidney disease with heart failure and stage 1 through stage 4 chronic kidney disease, or unspecified chronic kidney disease: Secondary | ICD-10-CM | POA: Diagnosis not present

## 2017-07-01 DIAGNOSIS — I129 Hypertensive chronic kidney disease with stage 1 through stage 4 chronic kidney disease, or unspecified chronic kidney disease: Secondary | ICD-10-CM | POA: Diagnosis not present

## 2017-07-01 DIAGNOSIS — N184 Chronic kidney disease, stage 4 (severe): Secondary | ICD-10-CM | POA: Insufficient documentation

## 2017-07-01 DIAGNOSIS — I5042 Chronic combined systolic (congestive) and diastolic (congestive) heart failure: Secondary | ICD-10-CM | POA: Diagnosis not present

## 2017-07-01 DIAGNOSIS — R0981 Nasal congestion: Secondary | ICD-10-CM

## 2017-07-01 DIAGNOSIS — I10 Essential (primary) hypertension: Secondary | ICD-10-CM

## 2017-07-01 DIAGNOSIS — Z794 Long term (current) use of insulin: Secondary | ICD-10-CM | POA: Insufficient documentation

## 2017-07-01 LAB — CBG MONITORING, ED: Glucose-Capillary: 72 mg/dL (ref 65–99)

## 2017-07-01 MED ORDER — FLUTICASONE PROPIONATE 50 MCG/ACT NA SUSP
NASAL | Status: AC
  Filled 2017-07-01: qty 16

## 2017-07-01 MED ORDER — FLUTICASONE PROPIONATE 50 MCG/ACT NA SUSP
1.0000 | Freq: Every day | NASAL | Status: DC
  Administered 2017-07-01: 1 via NASAL
  Filled 2017-07-01: qty 16

## 2017-07-01 MED ORDER — HYDRALAZINE HCL 50 MG PO TABS
100.0000 mg | ORAL_TABLET | ORAL | Status: AC
  Administered 2017-07-01: 100 mg via ORAL
  Filled 2017-07-01: qty 2

## 2017-07-01 MED ORDER — DILTIAZEM HCL ER COATED BEADS 180 MG PO CP24
180.0000 mg | ORAL_CAPSULE | Freq: Every day | ORAL | Status: AC
  Administered 2017-07-01: 180 mg via ORAL
  Filled 2017-07-01 (×2): qty 1

## 2017-07-01 MED ORDER — CARVEDILOL 12.5 MG PO TABS
25.0000 mg | ORAL_TABLET | ORAL | Status: AC
  Administered 2017-07-01: 25 mg via ORAL
  Filled 2017-07-01: qty 2

## 2017-07-01 MED ORDER — HYDRALAZINE HCL 25 MG PO TABS
ORAL_TABLET | ORAL | Status: AC
  Filled 2017-07-01: qty 4

## 2017-07-01 MED ORDER — FLUTICASONE PROPIONATE 50 MCG/ACT NA SUSP: 2.0000 | Freq: Every day | NASAL | 2 refills | Status: DC

## 2017-07-01 NOTE — ED Provider Notes (Signed)
[facility] EMERGENCY DEPARTMENT Provider Note   CSN: [id number] Arrival date & time: 07/01/17  1655     History   Chief Complaint Chief Complaint  Patient presents with  . Nasal Congestion    HPI Charles Hart is a 61 y.o. male.  HPI  The patient is a 61 year old male, he has a known history of congestive heart failure, stroke, hypertension and diabetes, he presents to the hospital today with a complaint of nasal congestion sneezing and drainage from his nose which is been ongoing for the past 24 hours after being exposed to somebody who has been sick at his work with similar symptoms.  No fevers, no body aches or myalgias, no swelling of the legs, no shortness of breath and no coughing.  He has taken no medications for this prior to arrival and denies any fevers.  He states that he has not taken his blood pressure medication in the last 2 days and when asked why he states I do not have a reason.  He has states that he does have medication at home and has not run out.  Past Medical History:  Diagnosis Date  . Adenomatous colon polyp 07/02/2011   Last colonoscopy May 06, 2011 by Dr. Owens Loffler, who recommended repeat colonoscopy in 5 years.   . Background diabetic retinopathy 04/20/2012   Patient is followed by Dr. Katy Fitch   . Cardiomyopathy    LV function improved from 2004 to 2008.  Historically, moderately dilated LV with EF 30-40% by 2D echo 08/14/2002.  Mild CAD with severe LV dysfunction by cardiac cath 09/2002.  Normal coronary arteries and normal LV function by cardiac cath 09/19/2006.  A 2-D echo on 04/01/2009 showed mild concentric hypertrophy and normal systolic (LVEF  03-00%) and doppler C/W with grade 1 diastolic dysfunction.  . CHF (congestive heart failure) ([place])    LV function improved from 2004 to 2008.  Historically, moderately dilated LV with EF 30-40% by 2D echo 08/14/2002.  Mild CAD with severe LV dysfunction by cardiac cath 09/2002.  Normal coronary  arteries and normal LV function by cardiac cath 09/19/2006.  A 2-D echo on 04/01/2009 showed mild concentric hypertrophy and normal systolic (LVEF  92-33%) and doppler C/W with grade 1 diastolic dysfunction..   . Chronic combined systolic and diastolic congestive heart failure ([place]) 05/21/2010   LV function improved from 2004 to 2008.  Historically, moderately dilated LV with EF 30-40% by 2D echo 08/14/2002.  Mild CAD with severe LV dysfunction by cardiac cath 09/2002.  Normal coronary arteries and normal LV function by cardiac cath 09/19/2006.  A 2-D echo on 04/01/2009 showed mild concentric hypertrophy and normal systolic (LVEF  00-76%) and doppler parameters consistent with abnormal left   . CVA (cerebral vascular accident) ([place]) 07/04/2012   MRI of the brain 07/04/2012 showed an acute infarct in the right basal ganglia involving the anterior putamen, anterior limb internal capsule, and head of the caudate; this measured approximately 2.5 cm in diameter.     . Dermatitis   . Diabetes mellitus    type 2  . DIABETIC PERIPHERAL NEUROPATHY 08/03/2007   Qualifier: Diagnosis of  By: Marinda Elk MD, Sonia Side    . DM neuropathy, painful ([place])   . Dyspnea   . Hearing loss in right ear   . Hyperlipidemia   . Hypertension   . Hypertensive crisis 07/28/2012  . Hypertensive urgency 08/20/2014  . Myocardial infarction ([place])   . Nephrotic syndrome 02/18/2013   A 24-hour urine  collection 03/04/2013 showed total protein of 5,460 g and creatinine clearance of 80 mL/minute.  Patient was seen by Seward Meth at [facility] and a repeat 24-hour urine showed 10,407 mg protein.  Patient underwent kidney biopsy on 05/30/2013; pathology showed advanced diffuse and nodular diabetic nephropathy with vascular changes consistent with long-standing difficult to control hypertension.       Patient Active Problem List   Diagnosis Date Noted  . Acute on chronic combined systolic and diastolic CHF (congestive  heart failure) ([place]) 02/25/2017  . Piriformis syndrome of right side 03/03/2016  . Muscle spasm of left shoulder area 03/03/2016  . Secondary hyperparathyroidism, renal ([place]) 02/09/2016  . Primary osteoarthritis of left knee 01/07/2016  . Low back pain 01/07/2016  . Joint stiffness 11/13/2015  . Age-related nuclear cataract of both eyes 10/07/2015  . Normocytic anemia 08/22/2014  . CKD stage 4 due to type 2 diabetes mellitus ([place]) 06/03/2014  . Health care maintenance 05/16/2014  . Nephrotic syndrome 02/18/2013  . Obstructive sleep apnea 07/26/2012  . History of stroke 07/04/2012  . Controlled type 2 diabetes mellitus with both eyes affected by proliferative retinopathy without macular edema, with long-term current use of insulin ([place]) 04/20/2012  . ED (erectile dysfunction) 04/18/2012  . Adenomatous colon polyp 07/02/2011  . Chronic combined systolic and diastolic congestive heart failure ([place]) 05/21/2010  . Carotid bruit 04/01/2009  . Diabetic peripheral neuropathy associated with type 2 diabetes mellitus ([place]) 08/03/2007  . HEARING LOSS, RIGHT EAR 07/30/2007  . DM (diabetes mellitus), type 2 with renal complications ([place]) 37/85/8850  . Hyperlipidemia 06/01/2006  . Essential hypertension 06/01/2006    Past Surgical History:  Procedure Laterality Date  . CARDIAC CATHETERIZATION     3 times  . COLONOSCOPY    . FOOT SURGERY    . POLYPECTOMY         Home Medications    Prior to Admission medications   Medication Sig Start Date End Date Taking? Authorizing Provider  calcitRIOL (ROCALTROL) 0.25 MCG capsule Take 0.25 mcg by mouth every Monday, Wednesday, and Friday.  03/25/17   [provider]  CARTIA XT 180 MG 24 hr capsule TAKE 1 CAPSULE(180 MG) BY MOUTH DAILY 11/16/16   Joni Reining C, DO  carvedilol (COREG) 25 MG tablet TAKE ONE-HALF TABLET BY MOUTH TWICE DAILY (12.5MG  TWICE DAILY) 03/25/17   [provider]  clopidogrel (PLAVIX) 75 MG tablet Take 1 tablet  (75 mg total) by mouth daily. 01/17/17   Lucious Groves, DO  fluticasone (FLONASE) 50 MCG/ACT nasal spray Place 2 sprays daily into both nostrils. 07/01/17   Noemi Chapel, MD  furosemide (LASIX) 80 MG tablet Take 1 tablet (80 mg total) by mouth 2 (two) times daily. 01/17/17   Lucious Groves, DO  gabapentin (NEURONTIN) 300 MG capsule Take 300 mg by mouth at bedtime Patient taking differently: Take 300 mg by mouth at bedtime.  11/16/16   Lucious Groves, DO  hydrALAZINE (APRESOLINE) 100 MG tablet Take 1 tablet (100 mg total) by mouth 3 (three) times daily. 11/16/16 11/25/17  Lucious Groves, DO  Insulin Pen Needle (NOVOFINE PLUS) 32G X 4 MM MISC 1 Dose by Does not apply route daily. For Victoza, substitution ok for cheaper brand 04/04/17   Joni Reining C, DO  liraglutide (VICTOZA) 18 MG/3ML SOPN Inject 0.2 mLs (1.2 mg total) into the skin daily. 04/04/17   Bartholomew Crews, MD  Multiple Vitamins-Minerals (ONE-A-DAY MENS 50+ ADVANTAGE PO) Take 1 tablet  by mouth daily with breakfast.    [provider]  ondansetron (ZOFRAN ODT) 4 MG disintegrating tablet Take 1 tablet (4 mg total) by mouth every 8 (eight) hours as needed for nausea or vomiting. 05/03/17   Francine Graven, DO  pravastatin (PRAVACHOL) 40 MG tablet Take 1 tablet (40 mg total) by mouth daily. 01/17/17   Lucious Groves, DO  promethazine (PHENERGAN) 25 MG tablet Take 1 tablet (25 mg total) by mouth every 6 (six) hours as needed for nausea or vomiting. 05/04/17   Evalee Jefferson, PA-C    Family History Family History  Problem Relation Age of Onset  . Aneurysm Father 5       died of rupture  . Colon cancer Sister     Social History Social History   Tobacco Use  . Smoking status: Never Smoker  . Smokeless tobacco: Never Used  Substance Use Topics  . Alcohol use: No    Alcohol/week: 0.0 oz    Comment: Wine rarely.  . Drug use: No     Allergies   Ivp dye [iodinated diagnostic agents]; Amlodipine; Lisinopril; and Red  dye   Review of Systems Review of Systems  Constitutional: Negative for fever.  HENT: Positive for postnasal drip, rhinorrhea and sneezing. Negative for ear pain, facial swelling and sore throat.   Eyes: Negative for pain, discharge and redness.  Respiratory: Negative for cough, chest tightness and shortness of breath.   Cardiovascular: Negative for chest pain and leg swelling.     Physical Exam Updated Vital Signs BP (!) 217/131 (BP Location: Right Arm)   Pulse 98   Temp 99.2 F (37.3 C) (Oral)   Resp 18   Ht 6' (1.829 m)   Wt 97.5 kg (215 lb)   SpO2 (!) 84%   BMI 29.16 kg/m   Physical Exam  Constitutional: He appears well-developed and well-nourished. No distress.  HENT:  Head: Normocephalic and atraumatic.  Mouth/Throat: Oropharynx is clear and moist. No oropharyngeal exudate.  Slightly swollen turbinates clear oropharynx.  Eyes: Conjunctivae and EOM are normal. Pupils are equal, round, and reactive to light. Right eye exhibits no discharge. Left eye exhibits no discharge. No scleral icterus.  Neck: Normal range of motion. Neck supple. No JVD present. No thyromegaly present.  No trismus or torticollis, no lymphadenopathy of the anterior or posterior cervical chains  Cardiovascular: Normal rate, regular rhythm, normal heart sounds and intact distal pulses. Exam reveals no gallop and no friction rub.  No murmur heard. No tachycardia, no murmur, normal pulses, no JVD or edema  Pulmonary/Chest: Effort normal and breath sounds normal. No respiratory distress. He has no wheezes. He has no rales.  Lungs clear  Abdominal: Soft. Bowel sounds are normal. He exhibits no distension and no mass. There is no tenderness.  Non tender  Musculoskeletal: Normal range of motion. He exhibits no edema or tenderness.  Lymphadenopathy:    He has no cervical adenopathy.  Neurological: He is alert. Coordination normal.  Skin: Skin is warm and dry. No rash noted. No erythema.  Psychiatric: He  has a normal mood and affect. His behavior is normal.  Nursing note and vitals reviewed.    ED Treatments / Results  Labs (all labs ordered are listed, but only abnormal results are displayed) Labs Reviewed  CBG MONITORING, ED    Radiology No results found.  Procedures Procedures (including critical care time)  Medications Ordered in ED Medications  diltiazem (CARDIZEM CD) 24 hr capsule 180 mg (180 mg Oral  Given 07/01/17 1954)  carvedilol (COREG) tablet 25 mg (25 mg Oral Given 07/01/17 1952)  hydrALAZINE (APRESOLINE) tablet 100 mg (100 mg Oral Given 07/01/17 1953)     Initial Impression / Assessment and Plan / ED Course  I have reviewed the triage vital signs and the nursing notes.  Pertinent labs & imaging results that were available during my care of the patient were reviewed by me and considered in my medical decision making (see chart for details).    The pt has URI sx - likely viral Has not had BP meds and not suprisingly his BP is elevated though he is asymptomatic Will give dose of his home meds  The pt has no symtpoms of CP, SOB, HA or weakness / numbness BP is high but is likley chronically elevated BP meds given here Pt instructed on meds to use at home - Rx for flonase given  Needs f/u in 2 weeks for pressure checks - reaffirms that he has his home meds for BP at home.  Final Clinical Impressions(s) / ED Diagnoses   Final diagnoses:  Essential hypertension  Nasal congestion    ED Discharge Orders        Ordered    fluticasone (FLONASE) 50 MCG/ACT nasal spray  Daily     07/01/17 2019       Noemi Chapel, MD 07/01/17 2020

## 2017-07-01 NOTE — ED Triage Notes (Signed)
Pt reports having sinus drainage, sneezing, and aching.  Started last night.

## 2017-07-01 NOTE — ED Notes (Signed)
Pt reports that he has not had his HTN meds in several day- hydralazine and some others per his report    HB suggests that the pt needs to be moved to the acute side that he may be monitored

## 2017-07-01 NOTE — ED Notes (Signed)
ED Provider at bedside. 

## 2017-07-01 NOTE — Discharge Instructions (Signed)
You should take daily Zyrtec Daily Flonase - in the morning DO NOT TAKE ANY MEDICINES CONTAINING SUDAFED (PSEUDOEPHEDRINE) AS THIS WILL CAUSE YOUR BLOOD PRESSURE TO GO UP.  YOUR DOCTOR NEEDS TO RECHECK YOUR BLOOD PRESSURE IN 2 WEEKS  YOU WILL LIKELY HAVE CONGESTION AND SNEEZING AND MAY HAVE COUGHING AND SORE THROAT - THIS IS A VIRUS - READ THE ATTACHED INSTRUCTIONS  ER FOR WORSENING SYMPTOMS.

## 2017-07-01 NOTE — ED Notes (Signed)
In to d/c pt and pt states he cannot get his prescription for flonase filled before Wednesday. Notified Dr Sabra Heck who requested that I give it to him to go. Order carried out accordingly.

## 2017-07-01 NOTE — ED Notes (Signed)
Pt reports flu like sx for the last 48 hours  Also has not taken his HTN meds

## 2017-07-11 ENCOUNTER — Telehealth: Payer: Self-pay | Admitting: Internal Medicine

## 2017-07-11 NOTE — Telephone Encounter (Signed)
Patient has a Scientist, water quality to discuss with nurse/physician.

## 2017-07-11 NOTE — Telephone Encounter (Signed)
Lm for rtc 

## 2017-07-12 ENCOUNTER — Emergency Department (HOSPITAL_COMMUNITY)
Admission: EM | Admit: 2017-07-12 | Discharge: 2017-07-12 | Disposition: A | Discharge status: Home / Self Care | Payer: PPO | Attending: Emergency Medicine | Admitting: Emergency Medicine

## 2017-07-12 ENCOUNTER — Encounter: Payer: Self-pay | Admitting: *Deleted

## 2017-07-12 ENCOUNTER — Other Ambulatory Visit: Payer: Self-pay

## 2017-07-12 ENCOUNTER — Encounter (HOSPITAL_COMMUNITY): Payer: Self-pay | Admitting: Emergency Medicine

## 2017-07-12 ENCOUNTER — Ambulatory Visit (INDEPENDENT_AMBULATORY_CARE_PROVIDER_SITE_OTHER): Payer: PPO | Admitting: Internal Medicine

## 2017-07-12 DIAGNOSIS — I5042 Chronic combined systolic (congestive) and diastolic (congestive) heart failure: Secondary | ICD-10-CM | POA: Diagnosis not present

## 2017-07-12 DIAGNOSIS — Z7902 Long term (current) use of antithrombotics/antiplatelets: Secondary | ICD-10-CM | POA: Insufficient documentation

## 2017-07-12 DIAGNOSIS — N184 Chronic kidney disease, stage 4 (severe): Secondary | ICD-10-CM | POA: Diagnosis not present

## 2017-07-12 DIAGNOSIS — Z794 Long term (current) use of insulin: Secondary | ICD-10-CM | POA: Diagnosis not present

## 2017-07-12 DIAGNOSIS — Z79899 Other long term (current) drug therapy: Secondary | ICD-10-CM | POA: Diagnosis not present

## 2017-07-12 DIAGNOSIS — E1122 Type 2 diabetes mellitus with diabetic chronic kidney disease: Secondary | ICD-10-CM | POA: Diagnosis not present

## 2017-07-12 DIAGNOSIS — N472 Paraphimosis: Secondary | ICD-10-CM | POA: Insufficient documentation

## 2017-07-12 DIAGNOSIS — N5089 Other specified disorders of the male genital organs: Secondary | ICD-10-CM | POA: Diagnosis not present

## 2017-07-12 DIAGNOSIS — I130 Hypertensive heart and chronic kidney disease with heart failure and stage 1 through stage 4 chronic kidney disease, or unspecified chronic kidney disease: Secondary | ICD-10-CM | POA: Insufficient documentation

## 2017-07-12 DIAGNOSIS — N4889 Other specified disorders of penis: Secondary | ICD-10-CM | POA: Diagnosis present

## 2017-07-12 LAB — COMPREHENSIVE METABOLIC PANEL
ALBUMIN: 3.7 g/dL (ref 3.5–5.0)
ALT: 10 U/L — ABNORMAL LOW (ref 17–63)
AST: 16 U/L (ref 15–41)
Alkaline Phosphatase: 59 U/L (ref 38–126)
Anion gap: 11 (ref 5–15)
BUN: 76 mg/dL — AB (ref 6–20)
CHLORIDE: 103 mmol/L (ref 101–111)
CO2: 26 mmol/L (ref 22–32)
Calcium: 7.2 mg/dL — ABNORMAL LOW (ref 8.9–10.3)
Creatinine, Ser: 5.61 mg/dL — ABNORMAL HIGH (ref 0.61–1.24)
GFR calc Af Amer: 12 mL/min — ABNORMAL LOW (ref >60)
GFR calc non Af Amer: 10 mL/min — ABNORMAL LOW (ref >60)
GLUCOSE: 97 mg/dL (ref 65–99)
POTASSIUM: 3.5 mmol/L (ref 3.5–5.1)
Sodium: 140 mmol/L (ref 135–145)
Total Bilirubin: 0.5 mg/dL (ref 0.3–1.2)
Total Protein: 7.2 g/dL (ref 6.5–8.1)

## 2017-07-12 LAB — URINALYSIS, ROUTINE W REFLEX MICROSCOPIC
BACTERIA UA: NONE SEEN
Bilirubin Urine: NEGATIVE
GLUCOSE, UA: NEGATIVE mg/dL
Hgb urine dipstick: NEGATIVE
KETONES UR: NEGATIVE mg/dL
LEUKOCYTES UA: NEGATIVE
Nitrite: NEGATIVE
PROTEIN: 100 mg/dL — AB
SQUAMOUS EPITHELIAL / LPF: NONE SEEN
Specific Gravity, Urine: 1.01 (ref 1.005–1.030)
pH: 6 (ref 5.0–8.0)

## 2017-07-12 LAB — CBC WITH DIFFERENTIAL/PLATELET
BASOS ABS: 0 10*3/uL (ref 0.0–0.1)
BASOS PCT: 0 %
EOS PCT: 4 %
Eosinophils Absolute: 0.2 10*3/uL (ref 0.0–0.7)
HCT: 29.7 % — ABNORMAL LOW (ref 39.0–52.0)
Hemoglobin: 9.7 g/dL — ABNORMAL LOW (ref 13.0–17.0)
Lymphocytes Relative: 24 %
Lymphs Abs: 1.3 10*3/uL (ref 0.7–4.0)
MCH: 28.5 pg (ref 26.0–34.0)
MCHC: 32.7 g/dL (ref 30.0–36.0)
MCV: 87.4 fL (ref 78.0–100.0)
MONO ABS: 0.4 10*3/uL (ref 0.1–1.0)
Monocytes Relative: 8 %
NEUTROS ABS: 3.3 10*3/uL (ref 1.7–7.7)
Neutrophils Relative %: 64 %
PLATELETS: 280 10*3/uL (ref 150–400)
RBC: 3.4 MIL/uL — ABNORMAL LOW (ref 4.22–5.81)
RDW: 12.8 % (ref 11.5–15.5)
WBC: 5.3 10*3/uL (ref 4.0–10.5)

## 2017-07-12 LAB — CBG MONITORING, ED: Glucose-Capillary: 87 mg/dL (ref 65–99)

## 2017-07-12 MED ORDER — ONDANSETRON HCL 4 MG/2ML IJ SOLN
4.0000 mg | Freq: Once | INTRAMUSCULAR | Status: AC
  Administered 2017-07-12: 4 mg via INTRAVENOUS
  Filled 2017-07-12: qty 2

## 2017-07-12 MED ORDER — HYDROMORPHONE HCL 1 MG/ML IJ SOLN
1.0000 mg | Freq: Once | INTRAMUSCULAR | Status: AC
  Administered 2017-07-12: 1 mg via INTRAVENOUS
  Filled 2017-07-12: qty 1

## 2017-07-12 MED ORDER — SODIUM CHLORIDE 0.9 % IV BOLUS (SEPSIS)
1000.0000 mL | Freq: Once | INTRAVENOUS | Status: AC
  Administered 2017-07-12: 1000 mL via INTRAVENOUS

## 2017-07-12 MED ORDER — SODIUM CHLORIDE 0.9 % IV BOLUS (SEPSIS)
500.0000 mL | Freq: Once | INTRAVENOUS | Status: AC
  Administered 2017-07-12: 500 mL via INTRAVENOUS

## 2017-07-12 NOTE — Assessment & Plan Note (Addendum)
He presents with a 2-3 day history of penile pain that is severe in intensity. This has gotten worse and is exacerbated by posture changes or bearing down. He has been able to urinate with some discomfort but denies blood or discoloration. There is no pain in the abdomen or surrounding changes in his groin or perineum. He denies recent sexual activity, straining with activity or exercise, or activities like bicycling that apply pressure to the groin. He has never had a similar problem in the past.  He was somewhat guarded in his discussion of the problem so there may be other contributors to this not disclosed to me.  Assessment Penile pain with possible injury of the shaft of the penis. This could also be related to phimosis as he is uncircumcised. The severity of pain in the distal penis concerns me for risk of ischemia or tissue damage so I think an urgent evaluation is needed. Plan He will go to the ED directly from clinic for evaluation

## 2017-07-12 NOTE — Progress Notes (Signed)
Internal Medicine Clinic Attending  I saw and evaluated the patient.  I personally confirmed the key portions of the history and exam documented by Dr. Benjamine Mola and I reviewed pertinent patient test results.  The assessment, diagnosis, and plan were formulated together and I agree with the documentation in the resident's note. Foreskin retracted behind glans, patient with pain to touch of the area. Consistent with Paraphimosis. We have sent him to the ED for further evaluation and treatment.

## 2017-07-12 NOTE — ED Notes (Signed)
Checked patient cbg it was 87 notified RN of blood sugar patient is resting with call bell in reach 

## 2017-07-12 NOTE — ED Notes (Signed)
Urology at bedside.

## 2017-07-12 NOTE — Progress Notes (Addendum)
CC: Genital pain and swelling  HPI:  [name] is a 61 y.o. male with PMHx detailed below presenting with pain in his penis starting Sunday.  See problem based assessment and plan below for additional details.  Pain of male genitalia He presents with a 2-3 day history of penile pain that is severe in intensity. This has gotten worse and is exacerbated by posture changes or bearing down. He has been able to urinate with some discomfort but denies blood or discoloration. There is no pain in the abdomen or surrounding changes in his groin or perineum. He denies recent sexual activity, straining with activity or exercise, or activities like bicycling that apply pressure to the groin. He has never had a similar problem in the past.  He was somewhat guarded in his discussion of the problem so there may be other contributors to this not disclosed to me.  Assessment Penile pain with possible injury of the shaft of the penis. This could also be related to phimosis as he is uncircumcised. The severity of pain in the distal penis concerns me for risk of ischemia or tissue damage so I think an urgent evaluation is needed. Plan He will go to the ED directly from clinic for evaluation    Past Medical History:  Diagnosis Date  . Adenomatous colon polyp 07/02/2011   Last colonoscopy May 06, 2011 by Dr. Owens Loffler, who recommended repeat colonoscopy in 5 years.   . Background diabetic retinopathy 04/20/2012   Patient is followed by Dr. Katy Fitch   . Cardiomyopathy    LV function improved from 2004 to 2008.  Historically, moderately dilated LV with EF 30-40% by 2D echo 08/14/2002.  Mild CAD with severe LV dysfunction by cardiac cath 09/2002.  Normal coronary arteries and normal LV function by cardiac cath 09/19/2006.  A 2-D echo on 04/01/2009 showed mild concentric hypertrophy and normal systolic (LVEF  35-59%) and doppler C/W with grade 1 diastolic dysfunction.  . CHF (congestive heart  failure) ([place])    LV function improved from 2004 to 2008.  Historically, moderately dilated LV with EF 30-40% by 2D echo 08/14/2002.  Mild CAD with severe LV dysfunction by cardiac cath 09/2002.  Normal coronary arteries and normal LV function by cardiac cath 09/19/2006.  A 2-D echo on 04/01/2009 showed mild concentric hypertrophy and normal systolic (LVEF  74-16%) and doppler C/W with grade 1 diastolic dysfunction..   . Chronic combined systolic and diastolic congestive heart failure ([place]) 05/21/2010   LV function improved from 2004 to 2008.  Historically, moderately dilated LV with EF 30-40% by 2D echo 08/14/2002.  Mild CAD with severe LV dysfunction by cardiac cath 09/2002.  Normal coronary arteries and normal LV function by cardiac cath 09/19/2006.  A 2-D echo on 04/01/2009 showed mild concentric hypertrophy and normal systolic (LVEF  38-45%) and doppler parameters consistent with abnormal left   . CVA (cerebral vascular accident) ([place]) 07/04/2012   MRI of the brain 07/04/2012 showed an acute infarct in the right basal ganglia involving the anterior putamen, anterior limb internal capsule, and head of the caudate; this measured approximately 2.5 cm in diameter.     . Dermatitis   . Diabetes mellitus    type 2  . DIABETIC PERIPHERAL NEUROPATHY 08/03/2007   Qualifier: Diagnosis of  By: Marinda Elk MD, Sonia Side    . DM neuropathy, painful ([place])   . Dyspnea   . Hearing loss in right ear   . Hyperlipidemia   . Hypertension   .  Hypertensive crisis 07/28/2012  . Hypertensive urgency 08/20/2014  . Myocardial infarction ([place])   . Nephrotic syndrome 02/18/2013   A 24-hour urine collection 03/04/2013 showed total protein of 5,460 g and creatinine clearance of 80 mL/minute.  Patient was seen by Seward Meth at [facility] and a repeat 24-hour urine showed 10,407 mg protein.  Patient underwent kidney biopsy on 05/30/2013; pathology showed advanced diffuse and nodular diabetic nephropathy with  vascular changes consistent with long-standing difficult to control hypertension.       Review of Systems: Review of Systems  Constitutional: Negative for chills and fever.  Respiratory: Negative for shortness of breath.   Cardiovascular: Negative for leg swelling.  Gastrointestinal: Negative for abdominal pain, nausea and vomiting.  Genitourinary: Positive for dysuria. Negative for frequency and hematuria.  Musculoskeletal: Negative for falls.  Skin: Negative for rash.  Neurological: Negative for sensory change.  Endo/Heme/Allergies: Does not bruise/bleed easily.     Physical Exam: Vitals:   07/12/17 1002  BP: (!) 188/101  Pulse: 80  Temp: 97.9 F (36.6 C)  TempSrc: Oral  SpO2: 100%  Weight: 217 lb (98.4 kg)  Height: 6' (1.829 m)   GENERAL- alert, co-operative, uncomfortable but NAD CARDIAC- RRR, no murmurs, rubs or gallops. RESP- CTAB, no wheezes or crackles. ABDOMEN- Soft, nontender, no guarding or rebound, no palpable masses GENITOURINARY- Penis appears twisted with rightward deviation, swelling, with tight restriction around the shaft of the penis, distal skin changes and peeling, tenderness to touch There is no inguinal adenopathy or scrotal edema, pain is enhanced with vagal maneuver or posture changes PSYCH- Somewhat guarded affect and speech during conversation    Assessment & Plan:   See encounters tab for problem based medical decision making.   Patient seen with Dr. Angelia Mould

## 2017-07-12 NOTE — ED Provider Notes (Signed)
  Physical Exam  BP (!) 177/98   Pulse 70   Temp 98.5 F (36.9 C) (Oral)   Resp (!) 24   SpO2 94%   Physical Exam  ED Course  Procedures  MDM Pain improved after paraphimosis reduced by Dr. Alinda Money.  Discharge home.       Davonna Belling, MD 07/12/17 [phone]

## 2017-07-12 NOTE — Telephone Encounter (Signed)
Requesting to speak with a nurse about private matter. Also states he is in pain. Please call pt back.

## 2017-07-12 NOTE — Telephone Encounter (Signed)
Called pt - no answer; left message to give us a call back. 

## 2017-07-12 NOTE — Consult Note (Signed)
Urology Consult   Physician requesting consult: Dr. Roderic Palau  Reason for consult: Paraphimosis  History of Present Illness: Charles Hart is a 61 y.o. uncircumcised male who presented with penile pain and was noted to have paraphimosis on exam by Dr. Roderic Palau.  The patient states that this has been present since Sunday.  He has been voiding.  This has not happened to him before.  He does not describe a tight foreskin or symptoms that would suggest phimosis at baseline.  He is diabetic.  Attempts to reduce the paraphimosis by Dr. Roderic Palau were not successful.  Past Medical History:  Diagnosis Date  . Adenomatous colon polyp 07/02/2011   Last colonoscopy May 06, 2011 by Dr. Owens Loffler, who recommended repeat colonoscopy in 5 years.   . Background diabetic retinopathy 04/20/2012   Patient is followed by Dr. Katy Fitch   . Cardiomyopathy    LV function improved from 2004 to 2008.  Historically, moderately dilated LV with EF 30-40% by 2D echo 08/14/2002.  Mild CAD with severe LV dysfunction by cardiac cath 09/2002.  Normal coronary arteries and normal LV function by cardiac cath 09/19/2006.  A 2-D echo on 04/01/2009 showed mild concentric hypertrophy and normal systolic (LVEF  34-19%) and doppler C/W with grade 1 diastolic dysfunction.  . CHF (congestive heart failure) ([place])    LV function improved from 2004 to 2008.  Historically, moderately dilated LV with EF 30-40% by 2D echo 08/14/2002.  Mild CAD with severe LV dysfunction by cardiac cath 09/2002.  Normal coronary arteries and normal LV function by cardiac cath 09/19/2006.  A 2-D echo on 04/01/2009 showed mild concentric hypertrophy and normal systolic (LVEF  62-22%) and doppler C/W with grade 1 diastolic dysfunction..   . Chronic combined systolic and diastolic congestive heart failure ([place]) 05/21/2010   LV function improved from 2004 to 2008.  Historically, moderately dilated LV with EF 30-40% by 2D echo 08/14/2002.  Mild CAD with severe LV  dysfunction by cardiac cath 09/2002.  Normal coronary arteries and normal LV function by cardiac cath 09/19/2006.  A 2-D echo on 04/01/2009 showed mild concentric hypertrophy and normal systolic (LVEF  97-98%) and doppler parameters consistent with abnormal left   . CVA (cerebral vascular accident) ([place]) 07/04/2012   MRI of the brain 07/04/2012 showed an acute infarct in the right basal ganglia involving the anterior putamen, anterior limb internal capsule, and head of the caudate; this measured approximately 2.5 cm in diameter.     . Dermatitis   . Diabetes mellitus    type 2  . DIABETIC PERIPHERAL NEUROPATHY 08/03/2007   Qualifier: Diagnosis of  By: Marinda Elk MD, Sonia Side    . DM neuropathy, painful ([place])   . Dyspnea   . Hearing loss in right ear   . Hyperlipidemia   . Hypertension   . Hypertensive crisis 07/28/2012  . Hypertensive urgency 08/20/2014  . Myocardial infarction ([place])   . Nephrotic syndrome 02/18/2013   A 24-hour urine collection 03/04/2013 showed total protein of 5,460 g and creatinine clearance of 80 mL/minute.  Patient was seen by Seward Meth at [facility] and a repeat 24-hour urine showed 10,407 mg protein.  Patient underwent kidney biopsy on 05/30/2013; pathology showed advanced diffuse and nodular diabetic nephropathy with vascular changes consistent with long-standing difficult to control hypertension.       Past Surgical History:  Procedure Laterality Date  . CARDIAC CATHETERIZATION     3 times  . COLONOSCOPY    . FOOT  SURGERY    . POLYPECTOMY      Medications:  Home meds:  Current Meds  Medication Sig  . calcitRIOL (ROCALTROL) 0.25 MCG capsule Take 0.25 mcg by mouth every Monday, Wednesday, and Friday.   [name] CARTIA XT 180 MG 24 hr capsule TAKE 1 CAPSULE(180 MG) BY MOUTH DAILY  . carvedilol (COREG) 12.5 MG tablet Take 12.5 mg by mouth 2 (two) times daily with a meal.  . clopidogrel (PLAVIX) 75 MG tablet Take 1 tablet (75 mg total) by mouth  daily.  . fluticasone (FLONASE) 50 MCG/ACT nasal spray Place 2 sprays daily into both nostrils.  . furosemide (LASIX) 80 MG tablet Take 1 tablet (80 mg total) by mouth 2 (two) times daily.  [name] gabapentin (NEURONTIN) 300 MG capsule Take 300 mg by mouth at bedtime (Patient taking differently: Take 300 mg by mouth at bedtime. )  . hydrALAZINE (APRESOLINE) 100 MG tablet Take 1 tablet (100 mg total) by mouth 3 (three) times daily.  . Insulin Pen Needle (NOVOFINE PLUS) 32G X 4 MM MISC 1 Dose by Does not apply route daily. For Victoza, substitution ok for cheaper brand  . liraglutide (VICTOZA) 18 MG/3ML SOPN Inject 0.2 mLs (1.2 mg total) into the skin daily.  . Multiple Vitamins-Minerals (ONE-A-DAY MENS 50+ ADVANTAGE PO) Take 1 tablet by mouth daily with breakfast.  . ondansetron (ZOFRAN ODT) 4 MG disintegrating tablet Take 1 tablet (4 mg total) by mouth every 8 (eight) hours as needed for nausea or vomiting.  . pravastatin (PRAVACHOL) 40 MG tablet Take 1 tablet (40 mg total) by mouth daily.  . promethazine (PHENERGAN) 25 MG tablet Take 1 tablet (25 mg total) by mouth every 6 (six) hours as needed for nausea or vomiting.    Scheduled Meds: Continuous Infusions: PRN Meds:.  Allergies:  Allergies  Allergen Reactions  . Ivp Dye [Iodinated Diagnostic Agents] Other (See Comments)    Per patient's Nephrologist, he doesn't want the patient exposed to ANY dye because of issues with his kidneys  . Amlodipine Swelling  . Lisinopril Cough  . Red Dye Other (See Comments)    NO dye of any kind (issues with his kidneys)    Family History  Problem Relation Age of Onset  . Aneurysm Father 29       died of rupture  . Colon cancer Sister     Social History:  reports that  has never smoked. he has never used smokeless tobacco. He reports that he does not drink alcohol or use drugs.  ROS: A complete review of systems was performed.  All systems are negative except for pertinent findings as  noted.  Physical Exam:  Vital signs in last 24 hours: Temp:  [97.9 F (36.6 C)-98.5 F (36.9 C)] 98.5 F (36.9 C) (11/28 1041) Pulse Rate:  [70-80] 70 (11/28 1745) Resp:  [12-24] 24 (11/28 1745) BP: (174-198)/(89-113) 177/98 (11/28 1745) SpO2:  [90 %-100 %] 94 % (11/28 1745) Weight:  [98.4 kg (217 lb)] 98.4 kg (217 lb) (11/28 1002) Constitutional:  Alert and oriented, No acute distress Cardiovascular: Regular rate and rhythm, No JVD Respiratory: Normal respiratory effort GI: Abdomen is soft, nontender, nondistended, no abdominal masses Genitourinary: No CVAT. The penis is edematous with a swollen glans penis and the foreskin behind the glans consistent with paraphimosis. Testes are descended bilaterally and non-tender and without masses, scrotum is normal in appearance without lesions or masses, perineum is normal on inspection. Lymphatic: No lymphadenopathy Neurologic: Grossly intact, no focal deficits Psychiatric: Normal  mood and affect  Laboratory Data:  Recent Labs    07/12/17 1242  WBC 5.3  HGB 9.7*  HCT 29.7*  PLT 280    Recent Labs    07/12/17 1242  NA 140  K 3.5  CL 103  GLUCOSE 97  BUN 76*  CALCIUM 7.2*  CREATININE 5.61*     Results for orders placed or performed during the hospital encounter of 07/12/17 (from the past 24 hour(s))  CBC with Differential/Platelet     Status: Abnormal   Collection Time: 07/12/17 12:42 PM  Result Value Ref Range   WBC 5.3 4.0 - 10.5 K/uL   RBC 3.40 (L) 4.22 - 5.81 MIL/uL   Hemoglobin 9.7 (L) 13.0 - 17.0 g/dL   HCT 29.7 (L) 39.0 - 52.0 %   MCV 87.4 78.0 - 100.0 fL   MCH 28.5 26.0 - 34.0 pg   MCHC 32.7 30.0 - 36.0 g/dL   RDW 12.8 11.5 - 15.5 %   Platelets 280 150 - 400 K/uL   Neutrophils Relative % 64 %   Neutro Abs 3.3 1.7 - 7.7 K/uL   Lymphocytes Relative 24 %   Lymphs Abs 1.3 0.7 - 4.0 K/uL   Monocytes Relative 8 %   Monocytes Absolute 0.4 0.1 - 1.0 K/uL   Eosinophils Relative 4 %   Eosinophils Absolute 0.2 0.0  - 0.7 K/uL   Basophils Relative 0 %   Basophils Absolute 0.0 0.0 - 0.1 K/uL  Comprehensive metabolic panel     Status: Abnormal   Collection Time: 07/12/17 12:42 PM  Result Value Ref Range   Sodium 140 135 - 145 mmol/L   Potassium 3.5 3.5 - 5.1 mmol/L   Chloride 103 101 - 111 mmol/L   CO2 26 22 - 32 mmol/L   Glucose, Bld 97 65 - 99 mg/dL   BUN 76 (H) 6 - 20 mg/dL   Creatinine, Ser 5.61 (H) 0.61 - 1.24 mg/dL   Calcium 7.2 (L) 8.9 - 10.3 mg/dL   Total Protein 7.2 6.5 - 8.1 g/dL   Albumin 3.7 3.5 - 5.0 g/dL   AST 16 15 - 41 U/L   ALT 10 (L) 17 - 63 U/L   Alkaline Phosphatase 59 38 - 126 U/L   Total Bilirubin 0.5 0.3 - 1.2 mg/dL   GFR calc non Af Amer 10 (L) >60 mL/min   GFR calc Af Amer 12 (L) >60 mL/min   Anion gap 11 5 - 15  Urinalysis, Routine w reflex microscopic     Status: Abnormal   Collection Time: 07/12/17  2:20 PM  Result Value Ref Range   Color, Urine STRAW (A) YELLOW   APPearance CLEAR CLEAR   Specific Gravity, Urine 1.010 1.005 - 1.030   pH 6.0 5.0 - 8.0   Glucose, UA NEGATIVE NEGATIVE mg/dL   Hgb urine dipstick NEGATIVE NEGATIVE   Bilirubin Urine NEGATIVE NEGATIVE   Ketones, ur NEGATIVE NEGATIVE mg/dL   Protein, ur 100 (A) NEGATIVE mg/dL   Nitrite NEGATIVE NEGATIVE   Leukocytes, UA NEGATIVE NEGATIVE   RBC / HPF 0-5 0 - 5 RBC/hpf   WBC, UA 0-5 0 - 5 WBC/hpf   Bacteria, UA NONE SEEN NONE SEEN   Squamous Epithelial / LPF NONE SEEN NONE SEEN  CBG monitoring, ED     Status: None   Collection Time: 07/12/17  2:31 PM  Result Value Ref Range   Glucose-Capillary 87 65 - 99 mg/dL   No results found for this or any  previous visit (from the past 240 hour(s)).  Renal Function: Recent Labs    07/12/17 1242  CREATININE 5.61*   Estimated Creatinine Clearance: 17 mL/min (A) (by C-G formula based on SCr of 5.61 mg/dL (H)).  Procedure: The patient was administered 1 mg of hydromorphone prior to the procedure.  I then squeezed the glans penis to expel the distal edema.   I was then able to manually reduce the paraphimosis.  Impression/Recommendation Paraphimosis: He was instructed to keep his foreskin pulled over the glans and not to retract it for any reason until all of the swelling has resolved.  He was also instructed to make sure he does not leave his foreskin retracted behind the glans for any length of time in the future.  He will follow up as needed if he has further concerns or develops worsening phimosis in the future.  Mathhew Buysse,LES 07/12/2017, 6:16 PM    Pryor Curia. MD  CC: Dr. Roderic Palau

## 2017-07-12 NOTE — ED Triage Notes (Signed)
Pt here with c/o penis pain , pt has some swelling to his penis , pt is uncircumcised , pt states that this started Sunday

## 2017-07-12 NOTE — ED Provider Notes (Signed)
[facility] EMERGENCY DEPARTMENT Provider Note   CSN: [id number] Arrival date & time: 07/12/17  1030     History   Chief Complaint Chief Complaint  Patient presents with  . Groin Swelling    HPI Charles Hart is a 61 y.o. male.  Patient complains of pain in his penis.  This started on Sunday   The history is provided by the patient. No language interpreter was used.  Illness  This is a new problem. The current episode started more than 2 days ago. The problem occurs constantly. The problem has not changed since onset.Pertinent negatives include no chest pain, no abdominal pain and no headaches. Nothing aggravates the symptoms. Nothing relieves the symptoms. He has tried nothing for the symptoms. The treatment provided no relief.    Past Medical History:  Diagnosis Date  . Adenomatous colon polyp 07/02/2011   Last colonoscopy May 06, 2011 by Dr. Owens Loffler, who recommended repeat colonoscopy in 5 years.   . Background diabetic retinopathy 04/20/2012   Patient is followed by Dr. Katy Fitch   . Cardiomyopathy    LV function improved from 2004 to 2008.  Historically, moderately dilated LV with EF 30-40% by 2D echo 08/14/2002.  Mild CAD with severe LV dysfunction by cardiac cath 09/2002.  Normal coronary arteries and normal LV function by cardiac cath 09/19/2006.  A 2-D echo on 04/01/2009 showed mild concentric hypertrophy and normal systolic (LVEF  86-76%) and doppler C/W with grade 1 diastolic dysfunction.  . CHF (congestive heart failure) ([place])    LV function improved from 2004 to 2008.  Historically, moderately dilated LV with EF 30-40% by 2D echo 08/14/2002.  Mild CAD with severe LV dysfunction by cardiac cath 09/2002.  Normal coronary arteries and normal LV function by cardiac cath 09/19/2006.  A 2-D echo on 04/01/2009 showed mild concentric hypertrophy and normal systolic (LVEF  19-50%) and doppler C/W with grade 1 diastolic dysfunction..   . Chronic combined  systolic and diastolic congestive heart failure ([place]) 05/21/2010   LV function improved from 2004 to 2008.  Historically, moderately dilated LV with EF 30-40% by 2D echo 08/14/2002.  Mild CAD with severe LV dysfunction by cardiac cath 09/2002.  Normal coronary arteries and normal LV function by cardiac cath 09/19/2006.  A 2-D echo on 04/01/2009 showed mild concentric hypertrophy and normal systolic (LVEF  93-26%) and doppler parameters consistent with abnormal left   . CVA (cerebral vascular accident) ([facility]) 07/04/2012   MRI of the brain 07/04/2012 showed an acute infarct in the right basal ganglia involving the anterior putamen, anterior limb internal capsule, and head of the caudate; this measured approximately 2.5 cm in diameter.     . Dermatitis   . Diabetes mellitus    type 2  . DIABETIC PERIPHERAL NEUROPATHY 08/03/2007   Qualifier: Diagnosis of  By: Marinda Elk MD, Sonia Side    . DM neuropathy, painful ([place])   . Dyspnea   . Hearing loss in right ear   . Hyperlipidemia   . Hypertension   . Hypertensive crisis 07/28/2012  . Hypertensive urgency 08/20/2014  . Myocardial infarction ([place])   . Nephrotic syndrome 02/18/2013   A 24-hour urine collection 03/04/2013 showed total protein of 5,460 g and creatinine clearance of 80 mL/minute.  Patient was seen by Seward Meth at [place] and a repeat 24-hour urine showed 10,407 mg protein.  Patient underwent kidney biopsy on 05/30/2013; pathology showed advanced diffuse and nodular diabetic nephropathy with vascular changes  consistent with long-standing difficult to control hypertension.       Patient Active Problem List   Diagnosis Date Noted  . Pain of male genitalia 07/12/2017  . Acute on chronic combined systolic and diastolic CHF (congestive heart failure) ([place]) 02/25/2017  . Piriformis syndrome of right side 03/03/2016  . Muscle spasm of left shoulder area 03/03/2016  . Secondary hyperparathyroidism, renal ([place]) 02/09/2016  .  Primary osteoarthritis of left knee 01/07/2016  . Low back pain 01/07/2016  . Joint stiffness 11/13/2015  . Age-related nuclear cataract of both eyes 10/07/2015  . Normocytic anemia 08/22/2014  . CKD stage 4 due to type 2 diabetes mellitus ([place]) 06/03/2014  . Health care maintenance 05/16/2014  . Nephrotic syndrome 02/18/2013  . Obstructive sleep apnea 07/26/2012  . History of stroke 07/04/2012  . Controlled type 2 diabetes mellitus with both eyes affected by proliferative retinopathy without macular edema, with long-term current use of insulin ([place]) 04/20/2012  . ED (erectile dysfunction) 04/18/2012  . Adenomatous colon polyp 07/02/2011  . Chronic combined systolic and diastolic congestive heart failure ([place]) 05/21/2010  . Carotid bruit 04/01/2009  . Diabetic peripheral neuropathy associated with type 2 diabetes mellitus ([place]) 08/03/2007  . HEARING LOSS, RIGHT EAR 07/30/2007  . DM (diabetes mellitus), type 2 with renal complications ([place]) 83/72/9021  . Hyperlipidemia 06/01/2006  . Essential hypertension 06/01/2006    Past Surgical History:  Procedure Laterality Date  . CARDIAC CATHETERIZATION     3 times  . COLONOSCOPY    . FOOT SURGERY    . POLYPECTOMY         Home Medications    Prior to Admission medications   Medication Sig Start Date End Date Taking? Authorizing Provider  calcitRIOL (ROCALTROL) 0.25 MCG capsule Take 0.25 mcg by mouth every Monday, Wednesday, and Friday.  03/25/17  Yes [provider]  CARTIA XT 180 MG 24 hr capsule TAKE 1 CAPSULE(180 MG) BY MOUTH DAILY 11/16/16  Yes Joni Reining C, DO  carvedilol (COREG) 12.5 MG tablet Take 12.5 mg by mouth 2 (two) times daily with a meal.   Yes [provider]  clopidogrel (PLAVIX) 75 MG tablet Take 1 tablet (75 mg total) by mouth daily. 01/17/17  Yes Lucious Groves, DO  fluticasone (FLONASE) 50 MCG/ACT nasal spray Place 2 sprays daily into both nostrils. 07/01/17  Yes Noemi Chapel, MD  furosemide  (LASIX) 80 MG tablet Take 1 tablet (80 mg total) by mouth 2 (two) times daily. 01/17/17  Yes Lucious Groves, DO  gabapentin (NEURONTIN) 300 MG capsule Take 300 mg by mouth at bedtime Patient taking differently: Take 300 mg by mouth at bedtime.  11/16/16  Yes Lucious Groves, DO  hydrALAZINE (APRESOLINE) 100 MG tablet Take 1 tablet (100 mg total) by mouth 3 (three) times daily. 11/16/16 11/25/17 Yes Lucious Groves, DO  Insulin Pen Needle (NOVOFINE PLUS) 32G X 4 MM MISC 1 Dose by Does not apply route daily. For Victoza, substitution ok for cheaper brand 04/04/17  Yes Hoffman, Erik C, DO  liraglutide (VICTOZA) 18 MG/3ML SOPN Inject 0.2 mLs (1.2 mg total) into the skin daily. 04/04/17  Yes Bartholomew Crews, MD  Multiple Vitamins-Minerals (ONE-A-DAY MENS 50+ ADVANTAGE PO) Take 1 tablet by mouth daily with breakfast.   Yes [provider]  ondansetron (ZOFRAN ODT) 4 MG disintegrating tablet Take 1 tablet (4 mg total) by mouth every 8 (eight) hours as needed for nausea or vomiting. 05/03/17  Yes Francine Graven, DO  pravastatin (  PRAVACHOL) 40 MG tablet Take 1 tablet (40 mg total) by mouth daily. 01/17/17  Yes Lucious Groves, DO  promethazine (PHENERGAN) 25 MG tablet Take 1 tablet (25 mg total) by mouth every 6 (six) hours as needed for nausea or vomiting. 05/04/17  Yes Idol, Almyra Free, PA-C    Family History Family History  Problem Relation Age of Onset  . Aneurysm Father 40       died of rupture  . Colon cancer Sister     Social History Social History   Tobacco Use  . Smoking status: Never Smoker  . Smokeless tobacco: Never Used  Substance Use Topics  . Alcohol use: No    Alcohol/week: 0.0 oz    Comment: Wine rarely.  . Drug use: No     Allergies   Ivp dye [iodinated diagnostic agents]; Amlodipine; Lisinopril; and Red dye   Review of Systems Review of Systems  Constitutional: Negative for appetite change and fatigue.  HENT: Negative for congestion, ear discharge and sinus  pressure.   Eyes: Negative for discharge.  Respiratory: Negative for cough.   Cardiovascular: Negative for chest pain.  Gastrointestinal: Negative for abdominal pain and diarrhea.  Genitourinary: Positive for penile pain. Negative for frequency and hematuria.  Musculoskeletal: Negative for back pain.  Skin: Negative for rash.  Neurological: Negative for seizures and headaches.  Psychiatric/Behavioral: Negative for hallucinations.     Physical Exam Updated Vital Signs BP (!) 198/99   Pulse 78   Temp 98.5 F (36.9 C) (Oral)   Resp (!) 23   SpO2 98%   Physical Exam  Constitutional: He is oriented to person, place, and time. He appears well-developed.  HENT:  Head: Normocephalic.  Eyes: Conjunctivae and EOM are normal. No scleral icterus.  Neck: Neck supple. No thyromegaly present.  Cardiovascular: Normal rate and regular rhythm. Exam reveals no gallop and no friction rub.  No murmur heard. Pulmonary/Chest: No stridor. He has no wheezes. He has no rales. He exhibits no tenderness.  Abdominal: He exhibits no distension. There is no tenderness. There is no rebound.  Genitourinary:  Genitourinary Comments: Penis severely swollen distally.  Patient has a paraphimosis.  Musculoskeletal: Normal range of motion. He exhibits no edema.  Lymphadenopathy:    He has no cervical adenopathy.  Neurological: He is oriented to person, place, and time. He exhibits normal muscle tone. Coordination normal.  Skin: No rash noted. No erythema.  Psychiatric: He has a normal mood and affect. His behavior is normal.     ED Treatments / Results  Labs (all labs ordered are listed, but only abnormal results are displayed) Labs Reviewed  CBC WITH DIFFERENTIAL/PLATELET - Abnormal; Notable for the following components:      Result Value   RBC 3.40 (*)    Hemoglobin 9.7 (*)    HCT 29.7 (*)    All other components within normal limits  COMPREHENSIVE METABOLIC PANEL - Abnormal; Notable for the  following components:   BUN 76 (*)    Creatinine, Ser 5.61 (*)    Calcium 7.2 (*)    ALT 10 (*)    GFR calc non Af Amer 10 (*)    GFR calc Af Amer 12 (*)    All other components within normal limits  URINALYSIS, ROUTINE W REFLEX MICROSCOPIC - Abnormal; Notable for the following components:   Color, Urine STRAW (*)    Protein, ur 100 (*)    All other components within normal limits  CBG MONITORING, ED  EKG  EKG Interpretation None       Radiology No results found.  Procedures Procedures (including critical care time)  Medications Ordered in ED Medications  HYDROmorphone (DILAUDID) injection 1 mg (1 mg Intravenous Given 07/12/17 1250)  ondansetron (ZOFRAN) injection 4 mg (4 mg Intravenous Given 07/12/17 1250)  sodium chloride 0.9 % bolus 1,000 mL (0 mLs Intravenous Stopped 07/12/17 1523)  sodium chloride 0.9 % bolus 500 mL (500 mLs Intravenous New Bag/Given 07/12/17 1529)  HYDROmorphone (DILAUDID) injection 1 mg (1 mg Intravenous Given 07/12/17 1531)     Initial Impression / Assessment and Plan / ED Course  I have reviewed the triage vital signs and the nursing notes.  Pertinent labs & imaging results that were available during my care of the patient were reviewed by me and considered in my medical decision making (see chart for details).    Paraphimosis for 3 days.  I am unable to relieve this.  Urology to see patient Final Clinical Impressions(s) / ED Diagnoses   Final diagnoses:  None    ED Discharge Orders    None       [name], MD 07/12/17 1540

## 2017-07-14 NOTE — Telephone Encounter (Signed)
Called pt, lm for rtc 

## 2017-07-14 NOTE — Telephone Encounter (Signed)
Will close this note as call has been returned 3 times

## 2017-09-04 DIAGNOSIS — G4733 Obstructive sleep apnea (adult) (pediatric): Secondary | ICD-10-CM | POA: Diagnosis not present

## 2017-09-06 DIAGNOSIS — Z85528 Personal history of other malignant neoplasm of kidney: Secondary | ICD-10-CM | POA: Insufficient documentation

## 2017-09-07 ENCOUNTER — Ambulatory Visit (INDEPENDENT_AMBULATORY_CARE_PROVIDER_SITE_OTHER): Payer: PPO | Admitting: Internal Medicine

## 2017-09-07 ENCOUNTER — Encounter: Payer: Self-pay | Admitting: Internal Medicine

## 2017-09-07 ENCOUNTER — Other Ambulatory Visit: Payer: Self-pay

## 2017-09-07 ENCOUNTER — Other Ambulatory Visit: Payer: Self-pay | Admitting: *Deleted

## 2017-09-07 VITALS — BP 145/64 | HR 75 | Temp 97.7°F | Ht 72.0 in | Wt 213.0 lb

## 2017-09-07 DIAGNOSIS — N2889 Other specified disorders of kidney and ureter: Secondary | ICD-10-CM | POA: Diagnosis not present

## 2017-09-07 DIAGNOSIS — I5042 Chronic combined systolic (congestive) and diastolic (congestive) heart failure: Secondary | ICD-10-CM | POA: Diagnosis not present

## 2017-09-07 DIAGNOSIS — I10 Essential (primary) hypertension: Secondary | ICD-10-CM | POA: Diagnosis not present

## 2017-09-07 DIAGNOSIS — N185 Chronic kidney disease, stage 5: Secondary | ICD-10-CM

## 2017-09-07 DIAGNOSIS — Z794 Long term (current) use of insulin: Secondary | ICD-10-CM

## 2017-09-07 DIAGNOSIS — I132 Hypertensive heart and chronic kidney disease with heart failure and with stage 5 chronic kidney disease, or end stage renal disease: Secondary | ICD-10-CM | POA: Diagnosis not present

## 2017-09-07 DIAGNOSIS — N2581 Secondary hyperparathyroidism of renal origin: Secondary | ICD-10-CM | POA: Diagnosis not present

## 2017-09-07 DIAGNOSIS — E1122 Type 2 diabetes mellitus with diabetic chronic kidney disease: Secondary | ICD-10-CM

## 2017-09-07 DIAGNOSIS — R011 Cardiac murmur, unspecified: Secondary | ICD-10-CM

## 2017-09-07 DIAGNOSIS — N184 Chronic kidney disease, stage 4 (severe): Secondary | ICD-10-CM | POA: Diagnosis not present

## 2017-09-07 DIAGNOSIS — Z79899 Other long term (current) drug therapy: Secondary | ICD-10-CM | POA: Diagnosis not present

## 2017-09-07 LAB — GLUCOSE, CAPILLARY: GLUCOSE-CAPILLARY: 157 mg/dL — AB (ref 65–99)

## 2017-09-07 LAB — POCT GLYCOSYLATED HEMOGLOBIN (HGB A1C): HEMOGLOBIN A1C: 4.5

## 2017-09-07 MED ORDER — PRAVASTATIN SODIUM 40 MG PO TABS: 40.0000 mg | ORAL_TABLET | Freq: Every day | ORAL | 3 refills | Status: DC

## 2017-09-07 MED ORDER — CLOPIDOGREL BISULFATE 75 MG PO TABS: 75.0000 mg | ORAL_TABLET | Freq: Every day | ORAL | 3 refills | Status: DC

## 2017-09-07 MED ORDER — FUROSEMIDE 80 MG PO TABS: 80.0000 mg | ORAL_TABLET | Freq: Two times a day (BID) | ORAL | 1 refills | Status: DC

## 2017-09-07 MED ORDER — HYDRALAZINE HCL 100 MG PO TABS: 100.0000 mg | ORAL_TABLET | Freq: Three times a day (TID) | ORAL | 3 refills | Status: DC

## 2017-09-07 MED ORDER — CARVEDILOL 12.5 MG PO TABS: 12.5000 mg | ORAL_TABLET | Freq: Two times a day (BID) | ORAL | 3 refills | Status: DC

## 2017-09-07 MED ORDER — CARTIA XT 180 MG PO CP24: ORAL_CAPSULE | ORAL | 3 refills | Status: DC

## 2017-09-07 NOTE — Patient Instructions (Addendum)
I will call you with your lab results.  I want you to call Dr Sanda Klein office for a follow up appointment.  It is important for you to follow up with Dr Diona Fanti about your kidney mass.

## 2017-09-07 NOTE — Progress Notes (Signed)
Patient requested transfers for diltiazem, carvedilol, and hydralazine to [facility] in [place]. Prescriptions sent

## 2017-09-07 NOTE — Progress Notes (Signed)
Subjective:  HPI: Charles Hart is a 62 y.o. male who presents for overdue followup of multiple medical issues including HTN, CHF, CKD.  Please see Assessment and Plan below for the status of his chronic medical problems.  Review of Systems: Review of Systems  Constitutional: Negative for chills, fever and malaise/fatigue.  Respiratory: Negative for cough and shortness of breath.   Cardiovascular: Negative for chest pain.  Gastrointestinal: Negative for abdominal pain and diarrhea.    Objective:  Physical Exam: Vitals:   09/07/17 1031  BP: (!) 145/64  Pulse: 75  Temp: 97.7 F (36.5 C)  TempSrc: Oral  SpO2: 99%  Weight: 213 lb (96.6 kg)  Height: 6' (1.829 m)   Physical Exam  Constitutional: He is oriented to person, place, and time. No distress.  Cardiovascular: Normal rate and regular rhythm.  Murmur (2/6 systolic) heard. Pulmonary/Chest: Effort normal and breath sounds normal.  Abdominal: Soft. Bowel sounds are normal.  Musculoskeletal: He exhibits edema (1+ pedal).  Neurological: He is alert and oriented to person, place, and time.  No asterixis   Psychiatric: Memory normal.  Affect slightly depressed  Nursing note and vitals reviewed.  Assessment & Plan:  Chronic combined systolic and diastolic congestive heart failure (HCC) HPI: no specific complaints.    A: Chronic combined CHF  P: No ACE/ARB due to CKD Continue Carvedilol- per Pharmacy reconcilation this has now been increased to 25mg  BID (from 12.5mg  BID) and he filled yesterday. Lasix 80mg  BID  HTN (hypertension) HPI: No complaints  A: Essential HTN above goal  P: I had our pharmacist call his pharmacy, has not consistently refilled medications since August.  BP not as elevated today as it has been in the past.  Will likely need ongoing close follow up as he restarts these medications. - Current regimen Coreg 25mg  BID - Lasix 80mg  BID - Cartia XT 180mg  daily (this likely should be fist to go  if not needed due to sCHF) -Hydralazine 100mg  TID  Secondary hyperparathyroidism, renal ([place]) -Has not filled calcitriol per pharmacy records. - Checked labs; phos 4.2, calcium 7.6, PTH 215, vit D 15. -Needs to start calcitriol 0.82mcg three times weekly.  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 For his CKD stage 5, discussed need for better BP control as well as regular follow up with his nephrologist Dr Lorrene Reid whom he his overdue for follow up.  He is agreeable to call her office to schedule follow up. He did see [facility], no good candidate for fistula, will likely need graft when he goes onto dialysis.  Left renal mass HPI: Found to have a left renal mass on u/s last year, was followed up with MRI wo contrast> very concerning for RCC.  Followed up with Dr Luberta Robertson- urology on 02/10/17, I reviewed his note, recommended IR biopsy by Dr Kathlene Cote. I discussed this all with Mr Mccalla at length today. He understands he may have cancer.  He reports to me that social stressors have been the reason he has not followed up citing his divorce and move to [place].  He asks a number of questions, notably if resect ing the cancer will "cure" his kidney disease, I informed him that it will not as his kidney disease is due to years  of uncontrolled DM and HTN and likely contributed to the cancer.  A: Left renal mass concerning for RCC  P: As noted above a we had a long concersation about previous workup.  At the end he was still uncertain if he wanted to proceed with IR biopsy as had been recommended.  He was willing to see Dr Diona Fanti again to discuss options.  Our staff assisted him in making a follow up appointment.  I spent 40 minutes face to face with the patient.  Greater than 50% of the time was spent on counseling and coordination of care, which is detailed in the A&P.   Medications Ordered Meds ordered this encounter  Medications  . DISCONTD: carvedilol (COREG) 12.5 MG tablet    Sig: Take 1 tablet (12.5 mg total) by mouth 2 (two) times daily with a meal.    Dispense:  180 tablet    Refill:  3  . CARTIA XT 180 MG 24 hr capsule    Sig: TAKE 1 CAPSULE(180 MG) BY MOUTH DAILY    Dispense:  90 capsule    Refill:  3  . hydrALAZINE (APRESOLINE) 100 MG tablet    Sig: Take 1 tablet (100 mg total) by mouth 3 (three) times daily.    Dispense:  270 tablet    Refill:  3   Other Orders Orders Placed This Encounter  Procedures  . Renal function panel  . Parathyroid Hormone, Intact w/Ca  . CBC with Diff  . Vitamin D (25 hydroxy)  . Glucose, capillary  . Lipid Profile  . POC Hbg A1C   Follow Up: Return in about 3 months (around 12/06/2017).

## 2017-09-08 ENCOUNTER — Other Ambulatory Visit: Payer: Self-pay | Admitting: Internal Medicine

## 2017-09-08 DIAGNOSIS — E1122 Type 2 diabetes mellitus with diabetic chronic kidney disease: Secondary | ICD-10-CM

## 2017-09-08 DIAGNOSIS — N184 Chronic kidney disease, stage 4 (severe): Secondary | ICD-10-CM

## 2017-09-08 DIAGNOSIS — Z794 Long term (current) use of insulin: Secondary | ICD-10-CM

## 2017-09-08 LAB — CBC WITH DIFFERENTIAL/PLATELET
Basophils Absolute: 0 10*3/uL (ref 0.0–0.2)
Basos: 1 %
EOS (ABSOLUTE): 0.2 10*3/uL (ref 0.0–0.4)
Eos: 6 %
Hematocrit: 29.7 % — ABNORMAL LOW (ref 37.5–51.0)
Hemoglobin: 10 g/dL — ABNORMAL LOW (ref 13.0–17.7)
Immature Grans (Abs): 0 10*3/uL (ref 0.0–0.1)
Immature Granulocytes: 1 %
LYMPHS ABS: 0.7 10*3/uL (ref 0.7–3.1)
Lymphs: 19 %
MCH: 28.5 pg (ref 26.6–33.0)
MCHC: 33.7 g/dL (ref 31.5–35.7)
MCV: 85 fL (ref 79–97)
MONOS ABS: 0.2 10*3/uL (ref 0.1–0.9)
Monocytes: 6 %
NEUTROS ABS: 2.4 10*3/uL (ref 1.4–7.0)
Neutrophils: 67 %
PLATELETS: 261 10*3/uL (ref 150–379)
RBC: 3.51 x10E6/uL — ABNORMAL LOW (ref 4.14–5.80)
RDW: 13.3 % (ref 12.3–15.4)
WBC: 3.6 10*3/uL (ref 3.4–10.8)

## 2017-09-08 LAB — PTH, INTACT AND CALCIUM
CALCIUM: 7.5 mg/dL — AB (ref 8.6–10.2)
PTH: 216 pg/mL — AB (ref 15–65)

## 2017-09-08 LAB — LIPID PANEL
CHOL/HDL RATIO: 4.1 ratio (ref 0.0–5.0)
Cholesterol, Total: 140 mg/dL (ref 100–199)
HDL: 34 mg/dL — ABNORMAL LOW (ref >39)
LDL CALC: 85 mg/dL (ref 0–99)
TRIGLYCERIDES: 107 mg/dL (ref 0–149)
VLDL Cholesterol Cal: 21 mg/dL (ref 5–40)

## 2017-09-08 LAB — RENAL FUNCTION PANEL
Albumin: 4.3 g/dL (ref 3.6–4.8)
BUN/Creatinine Ratio: 13 (ref 10–24)
BUN: 62 mg/dL — ABNORMAL HIGH (ref 8–27)
CALCIUM: 7.6 mg/dL — AB (ref 8.6–10.2)
CHLORIDE: 99 mmol/L (ref 96–106)
CO2: 21 mmol/L (ref 20–29)
CREATININE: 4.87 mg/dL — AB (ref 0.76–1.27)
GFR calc Af Amer: 14 mL/min/{1.73_m2} — ABNORMAL LOW (ref >59)
GFR calc non Af Amer: 12 mL/min/{1.73_m2} — ABNORMAL LOW (ref >59)
Glucose: 162 mg/dL — ABNORMAL HIGH (ref 65–99)
PHOSPHORUS: 4.2 mg/dL (ref 2.5–4.5)
Potassium: 3.3 mmol/L — ABNORMAL LOW (ref 3.5–5.2)
Sodium: 141 mmol/L (ref 134–144)

## 2017-09-08 LAB — VITAMIN D 25 HYDROXY (VIT D DEFICIENCY, FRACTURES): Vit D, 25-Hydroxy: 15 ng/mL — ABNORMAL LOW (ref 30.0–100.0)

## 2017-09-08 MED ORDER — INSULIN PEN NEEDLE 32G X 4 MM MISC: 1.0000 | Freq: Every day | 3 refills | Status: DC

## 2017-09-08 MED ORDER — LIRAGLUTIDE 18 MG/3ML ~~LOC~~ SOPN: 0.6000 mg | PEN_INJECTOR | Freq: Every day | SUBCUTANEOUS | 3 refills | Status: DC

## 2017-09-08 NOTE — Assessment & Plan Note (Addendum)
HPI: No complaints, not checking sugars frequently.  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 For his CKD stage 5, discussed need for better BP control as well as regular follow up with his nephrologist Dr Lorrene Reid whom he his overdue for follow up.  He is agreeable to call her office to schedule follow up. He did see [facility], no good candidate for fistula, will likely need graft when he goes onto dialysis.

## 2017-09-08 NOTE — Assessment & Plan Note (Signed)
HPI: no specific complaints.    A: Chronic combined CHF  P: No ACE/ARB due to CKD Continue Carvedilol- per Pharmacy reconcilation this has now been increased to 25mg  BID (from 12.5mg  BID) and he filled yesterday. Lasix 80mg  BID

## 2017-09-08 NOTE — Assessment & Plan Note (Signed)
-  Has not filled calcitriol per pharmacy records. - Checked labs; phos 4.2, calcium 7.6, PTH 215, vit D 15. -Needs to start calcitriol 0.56mcg three times weekly.

## 2017-09-08 NOTE — Assessment & Plan Note (Signed)
HPI: Found to have a left renal mass on u/s last year, was followed up with MRI wo contrast> very concerning for RCC.  Followed up with Dr Luberta Robertson- urology on 02/10/17, I reviewed his note, recommended IR biopsy by Dr Kathlene Cote. I discussed this all with Charles Hart at length today. He understands he may have cancer.  He reports to me that social stressors have been the reason he has not followed up citing his divorce and move to [place].  He asks a number of questions, notably if resect ing the cancer will "cure" his kidney disease, I informed him that it will not as his kidney disease is due to years of uncontrolled DM and HTN and likely contributed to the cancer.  A: Left renal mass concerning for RCC  P: As noted above a we had a long concersation about previous workup.  At the end he was still uncertain if he wanted to proceed with IR biopsy as had been recommended.  He was willing to see Dr Diona Fanti again to discuss options.  Our staff assisted him in making a follow up appointment.

## 2017-09-08 NOTE — Assessment & Plan Note (Signed)
HPI: No complaints  A: Essential HTN above goal  P: I had our pharmacist call his pharmacy, has not consistently refilled medications since August.  BP not as elevated today as it has been in the past.  Will likely need ongoing close follow up as he restarts these medications. - Current regimen Coreg 25mg  BID - Lasix 80mg  BID - Cartia XT 180mg  daily (this likely should be fist to go if not needed due to sCHF) -Hydralazine 100mg  TID

## 2017-09-29 DIAGNOSIS — H40003 Preglaucoma, unspecified, bilateral: Secondary | ICD-10-CM | POA: Diagnosis not present

## 2017-09-29 DIAGNOSIS — E113593 Type 2 diabetes mellitus with proliferative diabetic retinopathy without macular edema, bilateral: Secondary | ICD-10-CM | POA: Diagnosis not present

## 2017-09-29 DIAGNOSIS — Z961 Presence of intraocular lens: Secondary | ICD-10-CM | POA: Diagnosis not present

## 2017-09-29 DIAGNOSIS — H2511 Age-related nuclear cataract, right eye: Secondary | ICD-10-CM | POA: Diagnosis not present

## 2017-09-29 LAB — HM DIABETES EYE EXAM

## 2017-11-08 ENCOUNTER — Encounter (HOSPITAL_COMMUNITY): Payer: Self-pay

## 2017-11-08 ENCOUNTER — Emergency Department (HOSPITAL_COMMUNITY): Payer: PPO

## 2017-11-08 ENCOUNTER — Observation Stay (HOSPITAL_COMMUNITY): Payer: PPO

## 2017-11-08 ENCOUNTER — Other Ambulatory Visit: Payer: Self-pay

## 2017-11-08 ENCOUNTER — Observation Stay (HOSPITAL_COMMUNITY)
Admission: EM | Admit: 2017-11-08 | Discharge: 2017-11-09 | Disposition: A | Discharge status: Home / Self Care | Payer: PPO | Attending: Nephrology | Admitting: Nephrology

## 2017-11-08 DIAGNOSIS — R0982 Postnasal drip: Secondary | ICD-10-CM | POA: Insufficient documentation

## 2017-11-08 DIAGNOSIS — Z8673 Personal history of transient ischemic attack (TIA), and cerebral infarction without residual deficits: Secondary | ICD-10-CM | POA: Insufficient documentation

## 2017-11-08 DIAGNOSIS — Z794 Long term (current) use of insulin: Secondary | ICD-10-CM | POA: Diagnosis not present

## 2017-11-08 DIAGNOSIS — N289 Disorder of kidney and ureter, unspecified: Secondary | ICD-10-CM | POA: Diagnosis not present

## 2017-11-08 DIAGNOSIS — N189 Chronic kidney disease, unspecified: Secondary | ICD-10-CM | POA: Diagnosis not present

## 2017-11-08 DIAGNOSIS — R0981 Nasal congestion: Secondary | ICD-10-CM | POA: Diagnosis not present

## 2017-11-08 DIAGNOSIS — Z79899 Other long term (current) drug therapy: Secondary | ICD-10-CM | POA: Insufficient documentation

## 2017-11-08 DIAGNOSIS — I252 Old myocardial infarction: Secondary | ICD-10-CM | POA: Diagnosis not present

## 2017-11-08 DIAGNOSIS — I10 Essential (primary) hypertension: Secondary | ICD-10-CM

## 2017-11-08 DIAGNOSIS — R638 Other symptoms and signs concerning food and fluid intake: Secondary | ICD-10-CM | POA: Insufficient documentation

## 2017-11-08 DIAGNOSIS — I130 Hypertensive heart and chronic kidney disease with heart failure and stage 1 through stage 4 chronic kidney disease, or unspecified chronic kidney disease: Secondary | ICD-10-CM | POA: Diagnosis not present

## 2017-11-08 DIAGNOSIS — E1140 Type 2 diabetes mellitus with diabetic neuropathy, unspecified: Secondary | ICD-10-CM | POA: Insufficient documentation

## 2017-11-08 DIAGNOSIS — E1122 Type 2 diabetes mellitus with diabetic chronic kidney disease: Secondary | ICD-10-CM | POA: Diagnosis not present

## 2017-11-08 DIAGNOSIS — R531 Weakness: Secondary | ICD-10-CM | POA: Diagnosis not present

## 2017-11-08 DIAGNOSIS — R0602 Shortness of breath: Secondary | ICD-10-CM | POA: Diagnosis not present

## 2017-11-08 DIAGNOSIS — R0600 Dyspnea, unspecified: Secondary | ICD-10-CM | POA: Diagnosis present

## 2017-11-08 DIAGNOSIS — I5042 Chronic combined systolic (congestive) and diastolic (congestive) heart failure: Secondary | ICD-10-CM | POA: Diagnosis not present

## 2017-11-08 DIAGNOSIS — E876 Hypokalemia: Secondary | ICD-10-CM

## 2017-11-08 DIAGNOSIS — Z7902 Long term (current) use of antithrombotics/antiplatelets: Secondary | ICD-10-CM | POA: Diagnosis not present

## 2017-11-08 DIAGNOSIS — R0609 Other forms of dyspnea: Secondary | ICD-10-CM | POA: Diagnosis present

## 2017-11-08 HISTORY — DX: Disorder of kidney and ureter, unspecified: N28.9

## 2017-11-08 LAB — COMPREHENSIVE METABOLIC PANEL
ALK PHOS: 66 U/L (ref 38–126)
ALT: 9 U/L — AB (ref 17–63)
AST: 15 U/L (ref 15–41)
Albumin: 3.9 g/dL (ref 3.5–5.0)
Anion gap: 16 — ABNORMAL HIGH (ref 5–15)
BILIRUBIN TOTAL: 0.9 mg/dL (ref 0.3–1.2)
BUN: 66 mg/dL — AB (ref 6–20)
CO2: 24 mmol/L (ref 22–32)
CREATININE: 5.41 mg/dL — AB (ref 0.61–1.24)
Calcium: 7.3 mg/dL — ABNORMAL LOW (ref 8.9–10.3)
Chloride: 98 mmol/L — ABNORMAL LOW (ref 101–111)
GFR calc Af Amer: 12 mL/min — ABNORMAL LOW (ref >60)
GFR calc non Af Amer: 10 mL/min — ABNORMAL LOW (ref >60)
GLUCOSE: 139 mg/dL — AB (ref 65–99)
POTASSIUM: 3 mmol/L — AB (ref 3.5–5.1)
Sodium: 138 mmol/L (ref 135–145)
TOTAL PROTEIN: 7.5 g/dL (ref 6.5–8.1)

## 2017-11-08 LAB — BLOOD GAS, ARTERIAL
Acid-Base Excess: 3.4 mmol/L — ABNORMAL HIGH (ref 0.0–2.0)
Bicarbonate: 27.7 mmol/L (ref 20.0–28.0)
Drawn by: 382351
FIO2: 21
O2 Saturation: 95.8 %
PCO2 ART: 36 mmHg (ref 32.0–48.0)
PO2 ART: 77.9 mmHg — AB (ref 83.0–108.0)
Patient temperature: 37
pH, Arterial: 7.485 — ABNORMAL HIGH (ref 7.350–7.450)

## 2017-11-08 LAB — CBC WITH DIFFERENTIAL/PLATELET
BASOS PCT: 1 %
Basophils Absolute: 0 10*3/uL (ref 0.0–0.1)
Eosinophils Absolute: 0.2 10*3/uL (ref 0.0–0.7)
Eosinophils Relative: 6 %
HEMATOCRIT: 27.8 % — AB (ref 39.0–52.0)
Hemoglobin: 9.1 g/dL — ABNORMAL LOW (ref 13.0–17.0)
Lymphocytes Relative: 21 %
Lymphs Abs: 0.8 10*3/uL (ref 0.7–4.0)
MCH: 28.6 pg (ref 26.0–34.0)
MCHC: 32.7 g/dL (ref 30.0–36.0)
MCV: 87.4 fL (ref 78.0–100.0)
MONO ABS: 0.3 10*3/uL (ref 0.1–1.0)
MONOS PCT: 8 %
NEUTROS ABS: 2.5 10*3/uL (ref 1.7–7.7)
Neutrophils Relative %: 64 %
Platelets: 171 10*3/uL (ref 150–400)
RBC: 3.18 MIL/uL — ABNORMAL LOW (ref 4.22–5.81)
RDW: 12.7 % (ref 11.5–15.5)
WBC: 3.8 10*3/uL — ABNORMAL LOW (ref 4.0–10.5)

## 2017-11-08 LAB — TROPONIN I
TROPONIN I: 0.05 ng/mL — AB (ref <0.03)
Troponin I: 0.05 ng/mL (ref <0.03)

## 2017-11-08 LAB — MAGNESIUM: Magnesium: 1.6 mg/dL — ABNORMAL LOW (ref 1.7–2.4)

## 2017-11-08 LAB — D-DIMER, QUANTITATIVE: D-Dimer, Quant: 0.74 ug/mL-FEU — ABNORMAL HIGH (ref 0.00–0.50)

## 2017-11-08 MED ORDER — METOPROLOL TARTRATE 5 MG/5ML IV SOLN: 2.5000 mg | Freq: Three times a day (TID) | INTRAVENOUS | Status: DC | PRN

## 2017-11-08 MED ORDER — FLUTICASONE PROPIONATE 50 MCG/ACT NA SUSP
2.0000 | Freq: Every day | NASAL | Status: DC
  Administered 2017-11-09: 2 via NASAL
  Filled 2017-11-08 (×2): qty 16

## 2017-11-08 MED ORDER — ONDANSETRON HCL 4 MG/2ML IJ SOLN: 4.0000 mg | Freq: Four times a day (QID) | INTRAMUSCULAR | Status: DC | PRN

## 2017-11-08 MED ORDER — ACETAMINOPHEN 325 MG PO TABS: 650.0000 mg | ORAL_TABLET | Freq: Four times a day (QID) | ORAL | Status: DC | PRN

## 2017-11-08 MED ORDER — PRAVASTATIN SODIUM 40 MG PO TABS
40.0000 mg | ORAL_TABLET | Freq: Every day | ORAL | Status: DC
  Administered 2017-11-09: 40 mg via ORAL
  Filled 2017-11-08: qty 1

## 2017-11-08 MED ORDER — DILTIAZEM HCL ER COATED BEADS 180 MG PO CP24
180.0000 mg | ORAL_CAPSULE | Freq: Every day | ORAL | Status: DC
  Administered 2017-11-09: 180 mg via ORAL
  Filled 2017-11-08: qty 1

## 2017-11-08 MED ORDER — CARVEDILOL 12.5 MG PO TABS
25.0000 mg | ORAL_TABLET | Freq: Two times a day (BID) | ORAL | Status: DC
  Administered 2017-11-09 (×2): 25 mg via ORAL
  Filled 2017-11-08 (×3): qty 2

## 2017-11-08 MED ORDER — FUROSEMIDE 80 MG PO TABS
80.0000 mg | ORAL_TABLET | Freq: Two times a day (BID) | ORAL | Status: DC
  Administered 2017-11-09 (×2): 80 mg via ORAL
  Filled 2017-11-08 (×3): qty 1

## 2017-11-08 MED ORDER — HEPARIN (PORCINE) IN NACL 100-0.45 UNIT/ML-% IJ SOLN
1600.0000 [IU]/h | INTRAMUSCULAR | Status: DC
  Administered 2017-11-09 (×2): 1600 [IU]/h via INTRAVENOUS
  Filled 2017-11-08 (×2): qty 250

## 2017-11-08 MED ORDER — POTASSIUM CHLORIDE CRYS ER 20 MEQ PO TBCR
30.0000 meq | EXTENDED_RELEASE_TABLET | Freq: Once | ORAL | Status: AC
  Administered 2017-11-08: 30 meq via ORAL
  Filled 2017-11-08: qty 2

## 2017-11-08 MED ORDER — ACETAMINOPHEN 650 MG RE SUPP: 650.0000 mg | Freq: Four times a day (QID) | RECTAL | Status: DC | PRN

## 2017-11-08 MED ORDER — CALCITRIOL 0.25 MCG PO CAPS
0.2500 ug | ORAL_CAPSULE | ORAL | Status: DC
Start: 2017-11-10 — End: 2017-11-09

## 2017-11-08 MED ORDER — SODIUM CHLORIDE 0.9 % IV SOLN
INTRAVENOUS | Status: AC
  Administered 2017-11-09: 09:00:00 via INTRAVENOUS

## 2017-11-08 MED ORDER — ALBUTEROL SULFATE (2.5 MG/3ML) 0.083% IN NEBU: 2.5000 mg | INHALATION_SOLUTION | RESPIRATORY_TRACT | Status: DC | PRN

## 2017-11-08 MED ORDER — CLOPIDOGREL BISULFATE 75 MG PO TABS
75.0000 mg | ORAL_TABLET | Freq: Every day | ORAL | Status: DC
  Administered 2017-11-09: 75 mg via ORAL
  Filled 2017-11-08: qty 1

## 2017-11-08 MED ORDER — ONDANSETRON HCL 4 MG PO TABS: 4.0000 mg | ORAL_TABLET | Freq: Four times a day (QID) | ORAL | Status: DC | PRN

## 2017-11-08 MED ORDER — HYDRALAZINE HCL 25 MG PO TABS
100.0000 mg | ORAL_TABLET | Freq: Three times a day (TID) | ORAL | Status: DC
  Administered 2017-11-09 (×3): 100 mg via ORAL
  Filled 2017-11-08 (×3): qty 4

## 2017-11-08 MED ORDER — POTASSIUM CHLORIDE 10 MEQ/100ML IV SOLN
10.0000 meq | INTRAVENOUS | Status: AC
  Administered 2017-11-08 – 2017-11-09 (×4): 10 meq via INTRAVENOUS
  Filled 2017-11-08 (×4): qty 100

## 2017-11-08 MED ORDER — HEPARIN BOLUS VIA INFUSION
5000.0000 [IU] | Freq: Once | INTRAVENOUS | Status: AC
  Administered 2017-11-09: 5000 [IU] via INTRAVENOUS
  Filled 2017-11-08: qty 5000

## 2017-11-08 NOTE — ED Triage Notes (Signed)
Pt reports sinus congestion and drainage Sunday night.  Reports generalized weakness, fever, and sob.  Denies cough or congestion.  Denies any pain.

## 2017-11-08 NOTE — ED Notes (Signed)
Date and time results received: 11/08/17 2010 (use smartphrase ".now" to insert current time)  Test: troponin Critical Value: 0.05  Name of Provider Notified: Dr Alvino Chapel  Orders Received? Or Actions Taken?: Actions Taken: no orders received.

## 2017-11-08 NOTE — ED Notes (Signed)
Pt given a meal tray for dinner.

## 2017-11-08 NOTE — ED Provider Notes (Signed)
[facility] EMERGENCY DEPARTMENT Provider Note   CSN: [id number] Arrival date & time: 11/08/17  1737     History   Chief Complaint Chief Complaint  Patient presents with  . Shortness of Breath    HPI Charles Hart is a 62 y.o. male.  HPI Patient presents generalized weakness shortness of breath.  Has had nasal drainage that is clear.  Some congestion.  No chest pain.  Has had a decreased appetite.  No fevers or chills.  Does not feel as if he is volume overloaded no chest pain.  No real abdominal pain.  Has had some shortness of breath. Past Medical History:  Diagnosis Date  . Adenomatous colon polyp 07/02/2011   Last colonoscopy May 06, 2011 by Dr. Owens Loffler, who recommended repeat colonoscopy in 5 years.   . Background diabetic retinopathy 04/20/2012   Patient is followed by Dr. Katy Fitch   . Cardiomyopathy    LV function improved from 2004 to 2008.  Historically, moderately dilated LV with EF 30-40% by 2D echo 08/14/2002.  Mild CAD with severe LV dysfunction by cardiac cath 09/2002.  Normal coronary arteries and normal LV function by cardiac cath 09/19/2006.  A 2-D echo on 04/01/2009 showed mild concentric hypertrophy and normal systolic (LVEF  99-35%) and doppler C/W with grade 1 diastolic dysfunction.  . CHF (congestive heart failure) ([place])    LV function improved from 2004 to 2008.  Historically, moderately dilated LV with EF 30-40% by 2D echo 08/14/2002.  Mild CAD with severe LV dysfunction by cardiac cath 09/2002.  Normal coronary arteries and normal LV function by cardiac cath 09/19/2006.  A 2-D echo on 04/01/2009 showed mild concentric hypertrophy and normal systolic (LVEF  70-17%) and doppler C/W with grade 1 diastolic dysfunction..   . Chronic combined systolic and diastolic congestive heart failure ([place]) 05/21/2010   LV function improved from 2004 to 2008.  Historically, moderately dilated LV with EF 30-40% by 2D echo 08/14/2002.  Mild CAD with severe LV dysfunction by  cardiac cath 09/2002.  Normal coronary arteries and normal LV function by cardiac cath 09/19/2006.  A 2-D echo on 04/01/2009 showed mild concentric hypertrophy and normal systolic (LVEF  79-39%) and doppler parameters consistent with abnormal left   . CVA (cerebral vascular accident) ([place]) 07/04/2012   MRI of the brain 07/04/2012 showed an acute infarct in the right basal ganglia involving the anterior putamen, anterior limb internal capsule, and head of the caudate; this measured approximately 2.5 cm in diameter.     . Dermatitis   . Diabetes mellitus    type 2  . DIABETIC PERIPHERAL NEUROPATHY 08/03/2007   Qualifier: Diagnosis of  By: Marinda Elk MD, Sonia Side    . DM neuropathy, painful ([place])   . Dyspnea   . Hearing loss in right ear   . Hyperlipidemia   . Hypertension   . Hypertensive crisis 07/28/2012  . Hypertensive urgency 08/20/2014  . Myocardial infarction ([place])   . Nephrotic syndrome 02/18/2013   A 24-hour urine collection 03/04/2013 showed total protein of 5,460 g and creatinine clearance of 80 mL/minute.  Patient was seen by Seward Meth at [facility] and a repeat 24-hour urine showed 10,407 mg protein.  Patient underwent kidney biopsy on 05/30/2013; pathology showed advanced diffuse and nodular diabetic nephropathy with vascular changes consistent with long-standing difficult to control hypertension.       Patient Active Problem List   Diagnosis Date Noted  . Left renal mass 09/06/2017  .  Acute on chronic combined systolic and diastolic CHF (congestive heart failure) ([place]) 02/25/2017  . Piriformis syndrome of right side 03/03/2016  . Muscle spasm of left shoulder area 03/03/2016  . Secondary hyperparathyroidism, renal ([place]) 02/09/2016  . Primary osteoarthritis of left knee 01/07/2016  . Low back pain 01/07/2016  . Joint stiffness 11/13/2015  . Age-related nuclear cataract of both eyes 10/07/2015  . Normocytic anemia 08/22/2014  . Health care maintenance  05/16/2014  . Nephrotic syndrome 02/18/2013  . Obstructive sleep apnea 07/26/2012  . History of stroke 07/04/2012  . Controlled type 2 diabetes mellitus with both eyes affected by proliferative retinopathy without macular edema, with long-term current use of insulin ([place]) 04/20/2012  . ED (erectile dysfunction) 04/18/2012  . Adenomatous colon polyp 07/02/2011  . Chronic combined systolic and diastolic congestive heart failure ([place]) 05/21/2010  . Carotid bruit 04/01/2009  . Diabetic peripheral neuropathy associated with type 2 diabetes mellitus ([place]) 08/03/2007  . HEARING LOSS, RIGHT EAR 07/30/2007  . Controlled type 2 diabetes mellitus with chronic kidney disease ([place]) 09/15/2006  . Hyperlipidemia 06/01/2006  . HTN (hypertension) 06/01/2006    Past Surgical History:  Procedure Laterality Date  . CARDIAC CATHETERIZATION     3 times  . COLONOSCOPY    . FOOT SURGERY    . POLYPECTOMY          Home Medications    Prior to Admission medications   Medication Sig Start Date End Date Taking? Authorizing Provider  calcitRIOL (ROCALTROL) 0.25 MCG capsule Take 0.25 mcg by mouth every Monday, Wednesday, and Friday.  03/25/17  Yes [provider]  CARTIA XT 180 MG 24 hr capsule TAKE 1 CAPSULE(180 MG) BY MOUTH DAILY 09/07/17  Yes Lucious Groves, DO  carvedilol (COREG) 25 MG tablet Take 25 mg by mouth 2 (two) times daily with a meal.  09/06/17  Yes [provider]  clopidogrel (PLAVIX) 75 MG tablet Take 1 tablet (75 mg total) by mouth daily. 09/07/17  Yes Lucious Groves, DO  fluticasone (FLONASE) 50 MCG/ACT nasal spray Place 2 sprays daily into both nostrils. 07/01/17  Yes Noemi Chapel, MD  furosemide (LASIX) 80 MG tablet Take 1 tablet (80 mg total) by mouth 2 (two) times daily. 09/07/17  Yes Lucious Groves, DO  gabapentin (NEURONTIN) 300 MG capsule Take 300 mg by mouth at bedtime Patient taking differently: Take 300 mg by mouth at bedtime.  11/16/16  Yes Lucious Groves, DO    hydrALAZINE (APRESOLINE) 100 MG tablet Take 1 tablet (100 mg total) by mouth 3 (three) times daily. 09/07/17 09/16/18 Yes Lucious Groves, DO  liraglutide (VICTOZA) 18 MG/3ML SOPN Inject 0.1 mLs (0.6 mg total) into the skin daily. 09/08/17  Yes Lucious Groves, DO  pravastatin (PRAVACHOL) 40 MG tablet Take 1 tablet (40 mg total) by mouth daily. 09/07/17  Yes Lucious Groves, DO  promethazine (PHENERGAN) 25 MG tablet Take 1 tablet (25 mg total) by mouth every 6 (six) hours as needed for nausea or vomiting. 05/04/17  Yes Idol, Almyra Free, PA-C  Insulin Pen Needle (NOVOFINE PLUS) 32G X 4 MM MISC 1 Dose by Does not apply route daily. For Victoza, substitution ok for cheaper brand 09/08/17   Lucious Groves, DO    Family History Family History  Problem Relation Age of Onset  . Aneurysm Father 44       died of rupture  . Colon cancer Sister     Social History Social History   Tobacco Use  .  Smoking status: Never Smoker  . Smokeless tobacco: Never Used  Substance Use Topics  . Alcohol use: No    Alcohol/week: 0.0 oz    Comment: Wine rarely.  . Drug use: No     Allergies   Ivp dye [iodinated diagnostic agents]; Amlodipine; Lisinopril; and Red dye   Review of Systems Review of Systems  Constitutional: Positive for appetite change and fatigue.  HENT: Positive for congestion. Negative for sore throat.   Respiratory: Positive for shortness of breath.   Gastrointestinal: Negative for abdominal pain.  Genitourinary: Negative for enuresis.  Musculoskeletal: Negative for back pain.  Skin: Negative for rash.  Neurological: Negative for numbness.  Psychiatric/Behavioral: Negative for confusion.     Physical Exam Updated Vital Signs BP (!) 160/107   Pulse 73   Temp 98.3 F (36.8 C) (Oral)   Resp (!) 31   Ht 6' (1.829 m)   Wt 96.6 kg (213 lb)   SpO2 100%   BMI 28.89 kg/m   Physical Exam  Constitutional: He appears well-developed.  HENT:  Head: Atraumatic.  Eyes: Pupils are equal,  round, and reactive to light.  Neck: Neck supple.  Cardiovascular: Regular rhythm.  Pulmonary/Chest: Effort normal.  Abdominal: Soft. He exhibits no distension. There is no tenderness.  Musculoskeletal:  Mild bilateral lower extremity pitting edema.  Neurological: He is alert.  Skin: Skin is warm. Capillary refill takes less than 2 seconds.  Psychiatric: He has a normal mood and affect.     ED Treatments / Results  Labs (all labs ordered are listed, but only abnormal results are displayed) Labs Reviewed  COMPREHENSIVE METABOLIC PANEL - Abnormal; Notable for the following components:      Result Value   Potassium 3.0 (*)    Chloride 98 (*)    Glucose, Bld 139 (*)    BUN 66 (*)    Creatinine, Ser 5.41 (*)    Calcium 7.3 (*)    ALT 9 (*)    GFR calc non Af Amer 10 (*)    GFR calc Af Amer 12 (*)    Anion gap 16 (*)    All other components within normal limits  TROPONIN I - Abnormal; Notable for the following components:   Troponin I 0.05 (*)    All other components within normal limits  CBC WITH DIFFERENTIAL/PLATELET - Abnormal; Notable for the following components:   WBC 3.8 (*)    RBC 3.18 (*)    Hemoglobin 9.1 (*)    HCT 27.8 (*)    All other components within normal limits    EKG EKG Interpretation  Date/Time:  Wednesday November 08 2017 17:49:33 EDT Ventricular Rate:  80 PR Interval:    QRS Duration: 119 QT Interval:  460 QTC Calculation: 531 R Axis:   27 Text Interpretation:  Sinus rhythm Left atrial enlargement LVH with IVCD and secondary repol abnrm Anterior ST elevation, probably due to LVH Prolonged QT interval No significant change since last tracing Confirmed by Davonna Belling [phone]) on 11/08/2017 6:05:10 PM   Radiology Dg Chest 2 View  Result Date: 11/08/2017 CLINICAL DATA:  Shortness of breath.  Congestion fever. EXAM: CHEST - 2 VIEW COMPARISON:  Two-view chest x-ray 05/03/2017 FINDINGS: Heart size is exaggerated by patient positioning and low lung  volumes. There is no edema or effusion. No focal airspace disease is present. Degenerative changes of the thoracic spine are stable. IMPRESSION: 1. Low lung volumes. 2. No acute cardiopulmonary disease. Electronically Signed   By: Harrell Gave  Mattern M.D.   On: 11/08/2017 18:30    Procedures Procedures (including critical care time)  Medications Ordered in ED Medications - No data to display   Initial Impression / Assessment and Plan / ED Course  I have reviewed the triage vital signs and the nursing notes.  Pertinent labs & imaging results that were available during my care of the patient were reviewed by me and considered in my medical decision making (see chart for details).     Patient presents with generalized weakness and shortness of breath.  Afebrile.  However is hypertensive and tachypneic.  Has worsening renal function.  History of hypertension with questionable compliance.  Creatinine is now gone up to 5.7.  Troponin also mildly elevated.  With worsening renal function and the hypertension I feels the patient benefit from admission.  Patient's primary care is the internal medicine residents but I think patient would likely benefit from being seen in the hospital here for further evaluation and treatment and set of an immediate transfer per  Final Clinical Impressions(s) / ED Diagnoses   Final diagnoses:  Hypertension, unspecified type  Renal insufficiency  Hypokalemia    ED Discharge Orders    None       Davonna Belling, MD 11/08/17 2050

## 2017-11-08 NOTE — Progress Notes (Signed)
ANTICOAGULATION CONSULT NOTE - Preliminary  Pharmacy Consult for heparin Indication: pulmonary embolus  Allergies  Allergen Reactions  . Ivp Dye [Iodinated Diagnostic Agents] Other (See Comments)    Per patient's Nephrologist, he doesn't want the patient exposed to ANY dye because of issues with his kidneys  . Amlodipine Swelling  . Lisinopril Cough  . Red Dye Other (See Comments)    NO dye of any kind (issues with his kidneys)    Patient Measurements: Height: 6' (182.9 cm) Weight: 213 lb (96.6 kg) IBW/kg (Calculated) : 77.6 HEPARIN DW (KG): 96.6   Vital Signs: Temp: 98.3 F (36.8 C) (03/27 1747) Temp Source: Oral (03/27 1747) BP: 158/93 (03/27 2300) Pulse Rate: 68 (03/27 2315)  Labs: Recent Labs    11/08/17 1810 11/08/17 2146  HGB 9.1*  --   HCT 27.8*  --   PLT 171  --   CREATININE 5.41*  --   TROPONINI 0.05* 0.05*   Estimated Creatinine Clearance: 17.5 mL/min (A) (by C-G formula based on SCr of 5.41 mg/dL (H)).  Medical History: Past Medical History:  Diagnosis Date  . Adenomatous colon polyp 07/02/2011   Last colonoscopy May 06, 2011 by Dr. Owens Loffler, who recommended repeat colonoscopy in 5 years.   . Background diabetic retinopathy 04/20/2012   Patient is followed by Dr. Katy Fitch   . Cardiomyopathy    LV function improved from 2004 to 2008.  Historically, moderately dilated LV with EF 30-40% by 2D echo 08/14/2002.  Mild CAD with severe LV dysfunction by cardiac cath 09/2002.  Normal coronary arteries and normal LV function by cardiac cath 09/19/2006.  A 2-D echo on 04/01/2009 showed mild concentric hypertrophy and normal systolic (LVEF  51-02%) and doppler C/W with grade 1 diastolic dysfunction.  . CHF (congestive heart failure) ([place])    LV function improved from 2004 to 2008.  Historically, moderately dilated LV with EF 30-40% by 2D echo 08/14/2002.  Mild CAD with severe LV dysfunction by cardiac cath 09/2002.  Normal coronary arteries and normal LV function  by cardiac cath 09/19/2006.  A 2-D echo on 04/01/2009 showed mild concentric hypertrophy and normal systolic (LVEF  58-52%) and doppler C/W with grade 1 diastolic dysfunction..   . Chronic combined systolic and diastolic congestive heart failure ([place]) 05/21/2010   LV function improved from 2004 to 2008.  Historically, moderately dilated LV with EF 30-40% by 2D echo 08/14/2002.  Mild CAD with severe LV dysfunction by cardiac cath 09/2002.  Normal coronary arteries and normal LV function by cardiac cath 09/19/2006.  A 2-D echo on 04/01/2009 showed mild concentric hypertrophy and normal systolic (LVEF  77-82%) and doppler parameters consistent with abnormal left   . CVA (cerebral vascular accident) ([place]) 07/04/2012   MRI of the brain 07/04/2012 showed an acute infarct in the right basal ganglia involving the anterior putamen, anterior limb internal capsule, and head of the caudate; this measured approximately 2.5 cm in diameter.     . Dermatitis   . Diabetes mellitus    type 2  . DIABETIC PERIPHERAL NEUROPATHY 08/03/2007   Qualifier: Diagnosis of  By: Marinda Elk MD, Sonia Side    . DM neuropathy, painful ([place])   . Dyspnea   . Hearing loss in right ear   . Hyperlipidemia   . Hypertension   . Hypertensive crisis 07/28/2012  . Hypertensive urgency 08/20/2014  . Myocardial infarction ([place])   . Nephrotic syndrome 02/18/2013   A 24-hour urine collection 03/04/2013 showed total protein of 5,460 g and creatinine  clearance of 80 mL/minute.  Patient was seen by Seward Meth at [facility] and a repeat 24-hour urine showed 10,407 mg protein.  Patient underwent kidney biopsy on 05/30/2013; pathology showed advanced diffuse and nodular diabetic nephropathy with vascular changes consistent with long-standing difficult to control hypertension.       Medications:  Scheduled:  . [START ON 11/10/2017] calcitRIOL  0.25 mcg Oral Q M,W,F  . carvedilol  25 mg Oral BID WC  . clopidogrel  75 mg Oral Daily   . diltiazem  180 mg Oral Daily  . fluticasone  2 spray Each Nare Daily  . furosemide  80 mg Oral BID  . heparin  5,000 Units Intravenous Once  . hydrALAZINE  100 mg Oral TID  . pravastatin  40 mg Oral Daily   Infusions:  . sodium chloride    . heparin    . potassium chloride 10 mEq (11/08/17 2206)   PRN: acetaminophen **OR** acetaminophen, albuterol, metoprolol tartrate, ondansetron **OR** ondansetron (ZOFRAN) IV  Assessment: 62 yo male presented to ED with SOB with presumed PE. Starting heparin.   Goal of Therapy:  Heparin level 0.3-0.7 units/ml   Plan:  Give 5000 units bolus x 1 Start heparin infusion at 1600 units/hr Check anti-Xa level in 8 hours and daily while on heparin Continue to monitor H&H and platelets Preliminary review of pertinent patient information completed.  Forestine Na clinical pharmacist will complete review during morning rounds to assess the patient and finalize treatment regimen.  Esther Bradstreet, Magdalene Molly, RPH 11/08/2017,11:51 PM

## 2017-11-08 NOTE — H&P (Signed)
[facility] History and Physical  Charles Hart RDE:[id number] DOB: 12-13-1955 DOA: 11/08/2017  Referring physician:  PCP: Lucious Groves, DO   Chief Complaint: "I had trouble breathing."  HPI: Charles Hart is a 62 y.o. male past medical history significant for heart failure, diabetes, dyspnea, heart attack, untreated renal tumor that is possible malignancy.  Presented to emergency room with shortness of breath.  Patient denies bleeding volume overloaded.  States he is compliant with all medications.  States his dry weight is 213 pounds.  Has sudden onset of shortness of breath that was severe.  No history of past episodes.  No history of blood clots.  Denies any trauma. No recent URI.  ED Course:  CXR neg. Labs showed AKI on CKD. Mild trop elevation. Hospitalist consulted for admit.     Review of Systems:  As per HPI otherwise 10 point review of systems negative.    Past Medical History:  Diagnosis Date  . Adenomatous colon polyp 07/02/2011   Last colonoscopy May 06, 2011 by Dr. Owens Loffler, who recommended repeat colonoscopy in 5 years.   . Background diabetic retinopathy 04/20/2012   Patient is followed by Dr. Katy Fitch   . Cardiomyopathy    LV function improved from 2004 to 2008.  Historically, moderately dilated LV with EF 30-40% by 2D echo 08/14/2002.  Mild CAD with severe LV dysfunction by cardiac cath 09/2002.  Normal coronary arteries and normal LV function by cardiac cath 09/19/2006.  A 2-D echo on 04/01/2009 showed mild concentric hypertrophy and normal systolic (LVEF  63-14%) and doppler C/W with grade 1 diastolic dysfunction.  . CHF (congestive heart failure) ([place])    LV function improved from 2004 to 2008.  Historically, moderately dilated LV with EF 30-40% by 2D echo 08/14/2002.  Mild CAD with severe LV dysfunction by cardiac cath 09/2002.  Normal coronary arteries and normal LV function by cardiac cath 09/19/2006.  A 2-D echo on 04/01/2009 showed mild  concentric hypertrophy and normal systolic (LVEF  97-02%) and doppler C/W with grade 1 diastolic dysfunction..   . Chronic combined systolic and diastolic congestive heart failure ([place]) 05/21/2010   LV function improved from 2004 to 2008.  Historically, moderately dilated LV with EF 30-40% by 2D echo 08/14/2002.  Mild CAD with severe LV dysfunction by cardiac cath 09/2002.  Normal coronary arteries and normal LV function by cardiac cath 09/19/2006.  A 2-D echo on 04/01/2009 showed mild concentric hypertrophy and normal systolic (LVEF  63-78%) and doppler parameters consistent with abnormal left   . CVA (cerebral vascular accident) ([place]) 07/04/2012   MRI of the brain 07/04/2012 showed an acute infarct in the right basal ganglia involving the anterior putamen, anterior limb internal capsule, and head of the caudate; this measured approximately 2.5 cm in diameter.     . Dermatitis   . Diabetes mellitus    type 2  . DIABETIC PERIPHERAL NEUROPATHY 08/03/2007   Qualifier: Diagnosis of  By: Marinda Elk MD, Sonia Side    . DM neuropathy, painful ([place])   . Dyspnea   . Hearing loss in right ear   . Hyperlipidemia   . Hypertension   . Hypertensive crisis 07/28/2012  . Hypertensive urgency 08/20/2014  . Myocardial infarction ([place])   . Nephrotic syndrome 02/18/2013   A 24-hour urine collection 03/04/2013 showed total protein of 5,460 g and creatinine clearance of 80 mL/minute.  Patient was seen by Seward Meth at [place] and a repeat 24-hour urine showed 10,407  mg protein.  Patient underwent kidney biopsy on 05/30/2013; pathology showed advanced diffuse and nodular diabetic nephropathy with vascular changes consistent with long-standing difficult to control hypertension.      Past Surgical History:  Procedure Laterality Date  . CARDIAC CATHETERIZATION     3 times  . COLONOSCOPY    . FOOT SURGERY    . POLYPECTOMY     Social History:  reports that he has never smoked. He has never used  smokeless tobacco. He reports that he does not drink alcohol or use drugs.  Allergies  Allergen Reactions  . Ivp Dye [Iodinated Diagnostic Agents] Other (See Comments)    Per patient's Nephrologist, he doesn't want the patient exposed to ANY dye because of issues with his kidneys  . Amlodipine Swelling  . Lisinopril Cough  . Red Dye Other (See Comments)    NO dye of any kind (issues with his kidneys)    Family History  Problem Relation Age of Onset  . Aneurysm Father 90       died of rupture  . Colon cancer Sister      Prior to Admission medications   Medication Sig Start Date End Date Taking? Authorizing Provider  calcitRIOL (ROCALTROL) 0.25 MCG capsule Take 0.25 mcg by mouth every Monday, Wednesday, and Friday.  03/25/17  Yes [provider]  CARTIA XT 180 MG 24 hr capsule TAKE 1 CAPSULE(180 MG) BY MOUTH DAILY 09/07/17  Yes Lucious Groves, DO  carvedilol (COREG) 25 MG tablet Take 25 mg by mouth 2 (two) times daily with a meal.  09/06/17  Yes [provider]  clopidogrel (PLAVIX) 75 MG tablet Take 1 tablet (75 mg total) by mouth daily. 09/07/17  Yes Lucious Groves, DO  fluticasone (FLONASE) 50 MCG/ACT nasal spray Place 2 sprays daily into both nostrils. 07/01/17  Yes Noemi Chapel, MD  furosemide (LASIX) 80 MG tablet Take 1 tablet (80 mg total) by mouth 2 (two) times daily. 09/07/17  Yes Lucious Groves, DO  gabapentin (NEURONTIN) 300 MG capsule Take 300 mg by mouth at bedtime Patient taking differently: Take 300 mg by mouth at bedtime.  11/16/16  Yes Lucious Groves, DO  hydrALAZINE (APRESOLINE) 100 MG tablet Take 1 tablet (100 mg total) by mouth 3 (three) times daily. 09/07/17 09/16/18 Yes Lucious Groves, DO  liraglutide (VICTOZA) 18 MG/3ML SOPN Inject 0.1 mLs (0.6 mg total) into the skin daily. 09/08/17  Yes Lucious Groves, DO  pravastatin (PRAVACHOL) 40 MG tablet Take 1 tablet (40 mg total) by mouth daily. 09/07/17  Yes Lucious Groves, DO  promethazine (PHENERGAN) 25  MG tablet Take 1 tablet (25 mg total) by mouth every 6 (six) hours as needed for nausea or vomiting. 05/04/17  Yes Idol, Almyra Free, PA-C  Insulin Pen Needle (NOVOFINE PLUS) 32G X 4 MM MISC 1 Dose by Does not apply route daily. For Victoza, substitution ok for cheaper brand 09/08/17   Lucious Groves, DO   Physical Exam: Vitals:   11/08/17 2115 11/08/17 2130 11/08/17 2145 11/08/17 2200  BP:  (!) 168/100  (!) 164/87  Pulse: 79 78 73 73  Resp: (!) 24 16 (!) 23 (!) 24  Temp:      TempSrc:      SpO2: 95% 94% 94% 99%  Weight:      Height:        Wt Readings from Last 3 Encounters:  11/08/17 96.6 kg (213 lb)  09/07/17 96.6 kg (213 lb)  07/12/17  98.4 kg (217 lb)    General:  Appears calm and comfortable; A&Ox3 Eyes:  PERRL, EOMI, normal lids, iris ENT:  grossly normal hearing, lips & tongue Neck:  no LAD, masses or thyromegaly Cardiovascular:  RRR, no m/r/g. No LE edema. Symmetric pulses Rad, DP, PT Respiratory:  CTA bilaterally, no w/r/r. Tachypnea and use of accessory muscles. Abdomen:  soft, ntnd Skin:  no rash or induration seen on limited exam Musculoskeletal:  grossly normal tone BUE/BLE Psychiatric:  grossly normal mood and affect, speech fluent and appropriate Neurologic:  CN 2-12 grossly intact, moves all extremities in coordinated fashion.          Labs on Admission:  Basic Metabolic Panel: Recent Labs  Lab 11/08/17 1810 11/08/17 2146  NA 138  --   K 3.0*  --   CL 98*  --   CO2 24  --   GLUCOSE 139*  --   BUN 66*  --   CREATININE 5.41*  --   CALCIUM 7.3*  --   MG  --  1.6*   Liver Function Tests: Recent Labs  Lab 11/08/17 1810  AST 15  ALT 9*  ALKPHOS 66  BILITOT 0.9  PROT 7.5  ALBUMIN 3.9   No results for input(s): LIPASE, AMYLASE in the last 168 hours. No results for input(s): AMMONIA in the last 168 hours. CBC: Recent Labs  Lab 11/08/17 1810  WBC 3.8*  NEUTROABS 2.5  HGB 9.1*  HCT 27.8*  MCV 87.4  PLT 171   Cardiac Enzymes: Recent Labs  Lab  11/08/17 1810 11/08/17 2146  TROPONINI 0.05* 0.05*    BNP (last 3 results) Recent Labs    02/25/17 1632  BNP 1,101.4*    ProBNP (last 3 results) No results for input(s): PROBNP in the last 8760 hours.   Serum creatinine: 5.41 mg/dL (H) 11/08/17 1810 Estimated creatinine clearance: 17.5 mL/min (A)  CBG: No results for input(s): GLUCAP in the last 168 hours.  Radiological Exams on Admission: Dg Chest 2 View  Result Date: 11/08/2017 CLINICAL DATA:  Shortness of breath.  Congestion fever. EXAM: CHEST - 2 VIEW COMPARISON:  Two-view chest x-ray 05/03/2017 FINDINGS: Heart size is exaggerated by patient positioning and low lung volumes. There is no edema or effusion. No focal airspace disease is present. Degenerative changes of the thoracic spine are stable. IMPRESSION: 1. Low lung volumes. 2. No acute cardiopulmonary disease. Electronically Signed   By: San Morelle M.D.   On: 11/08/2017 18:30    EKG: Independently reviewed. NSR, no stemi TWI V4-V6, 1, aVL  Assessment/Plan Active Problems:   Dyspnea  Dyspnea at rest CT appears clear on my read, CXR clear; if neg will give lovenox and VQ scan in AM Mild trop elevation in picture of AKI likely from demand, pt wants nothing with dye, so no ASA Will trend ECHO complete ordered Prn albuterol  AKI on CKD Baseline Cr 4.8, Cr on admit 5.41 0 of normal saline given in the emergency room Gentle hydration overnight Checking magnesium and phosphorus Cont calcitrol  Hypertension Cont hydralazine  DM Hold home meds  CHF/Abn rhythm, Cont cartia, coreg, plavix, lasix  Allergies Cont flonase  Low K Replace and recheck  Chronic pain Hold neurontin  Hyperlipidemia Continue statin   Code Status: FC  DVT Prophylaxis: heparin Family Communication: dgtr at bedside Disposition Plan: Pending Improvement  Status: tele inpt  Elwin Mocha, MD Family Medicine [facility] [email or web address] Password [email or web address]

## 2017-11-08 NOTE — ED Notes (Signed)
Mickie Bail from [facility] notified of pt's order for VQ Lung Scan.

## 2017-11-09 ENCOUNTER — Other Ambulatory Visit: Payer: Self-pay

## 2017-11-09 ENCOUNTER — Encounter (HOSPITAL_COMMUNITY): Payer: Self-pay

## 2017-11-09 ENCOUNTER — Observation Stay (HOSPITAL_COMMUNITY): Payer: PPO

## 2017-11-09 DIAGNOSIS — N289 Disorder of kidney and ureter, unspecified: Secondary | ICD-10-CM | POA: Diagnosis not present

## 2017-11-09 DIAGNOSIS — N184 Chronic kidney disease, stage 4 (severe): Secondary | ICD-10-CM | POA: Diagnosis not present

## 2017-11-09 DIAGNOSIS — Z8673 Personal history of transient ischemic attack (TIA), and cerebral infarction without residual deficits: Secondary | ICD-10-CM | POA: Diagnosis not present

## 2017-11-09 DIAGNOSIS — I10 Essential (primary) hypertension: Secondary | ICD-10-CM

## 2017-11-09 DIAGNOSIS — E876 Hypokalemia: Secondary | ICD-10-CM | POA: Diagnosis not present

## 2017-11-09 DIAGNOSIS — R0602 Shortness of breath: Secondary | ICD-10-CM | POA: Diagnosis not present

## 2017-11-09 DIAGNOSIS — R0600 Dyspnea, unspecified: Secondary | ICD-10-CM | POA: Diagnosis not present

## 2017-11-09 DIAGNOSIS — I130 Hypertensive heart and chronic kidney disease with heart failure and stage 1 through stage 4 chronic kidney disease, or unspecified chronic kidney disease: Secondary | ICD-10-CM | POA: Diagnosis not present

## 2017-11-09 LAB — HEPARIN LEVEL (UNFRACTIONATED): Heparin Unfractionated: 0.64 IU/mL (ref 0.30–0.70)

## 2017-11-09 LAB — CBC
HEMATOCRIT: 30.4 % — AB (ref 39.0–52.0)
HEMOGLOBIN: 9.9 g/dL — AB (ref 13.0–17.0)
MCH: 28.2 pg (ref 26.0–34.0)
MCHC: 32.6 g/dL (ref 30.0–36.0)
MCV: 86.6 fL (ref 78.0–100.0)
Platelets: 244 10*3/uL (ref 150–400)
RBC: 3.51 MIL/uL — ABNORMAL LOW (ref 4.22–5.81)
RDW: 12.7 % (ref 11.5–15.5)
WBC: 5.9 10*3/uL (ref 4.0–10.5)

## 2017-11-09 LAB — GLUCOSE, CAPILLARY
GLUCOSE-CAPILLARY: 94 mg/dL (ref 65–99)
GLUCOSE-CAPILLARY: 110 mg/dL — AB (ref 65–99)
GLUCOSE-CAPILLARY: 111 mg/dL — AB (ref 65–99)

## 2017-11-09 LAB — APTT: aPTT: 32 seconds (ref 24–36)

## 2017-11-09 LAB — PROTIME-INR
INR: 1.14
Prothrombin Time: 14.6 seconds (ref 11.4–15.2)

## 2017-11-09 MED ORDER — TECHNETIUM TO 99M ALBUMIN AGGREGATED
4.0000 | Freq: Once | INTRAVENOUS | Status: AC | PRN
  Administered 2017-11-09: 4.4 via INTRAVENOUS

## 2017-11-09 MED ORDER — TECHNETIUM TC 99M DIETHYLENETRIAME-PENTAACETIC ACID
30.0000 | Freq: Once | INTRAVENOUS | Status: AC | PRN
  Administered 2017-11-09: 33 via RESPIRATORY_TRACT

## 2017-11-09 NOTE — Progress Notes (Signed)
ANTICOAGULATION CONSULT NOTE - Follow Up Consult  Pharmacy Consult for heparin Indication: pulmonary embolus  Allergies  Allergen Reactions  . Ivp Dye [Iodinated Diagnostic Agents] Other (See Comments)    Per patient's Nephrologist, he doesn't want the patient exposed to ANY dye because of issues with his kidneys  . Amlodipine Swelling  . Lisinopril Cough  . Red Dye Other (See Comments)    NO dye of any kind (issues with his kidneys)    Patient Measurements: Height: 6' (182.9 cm) Weight: 209 lb 3.5 oz (94.9 kg) IBW/kg (Calculated) : 77.6 Heparin Dosing Weight: 96.6 kg  Vital Signs: Temp: 98.2 F (36.8 C) (03/28 0814) Temp Source: Oral (03/28 0814) BP: 163/89 (03/28 0814) Pulse Rate: 79 (03/28 0814)  Labs: Recent Labs    11/08/17 1810 11/08/17 2146 11/09/17 0018 11/09/17 0548 11/09/17 1029  HGB 9.1*  --   --  9.9*  --   HCT 27.8*  --   --  30.4*  --   PLT 171  --   --  244  --   APTT  --   --  32  --   --   LABPROT  --   --  14.6  --   --   INR  --   --  1.14  --   --   HEPARINUNFRC  --   --   --   --  0.64  CREATININE 5.41*  --   --   --   --   TROPONINI 0.05* 0.05*  --   --   --     Estimated Creatinine Clearance: 17.4 mL/min (A) (by C-G formula based on SCr of 5.41 mg/dL (H)).   Medications:  Medications Prior to Admission  Medication Sig Dispense Refill Last Dose  . calcitRIOL (ROCALTROL) 0.25 MCG capsule Take 0.25 mcg by mouth every Monday, Wednesday, and Friday.   4 11/08/2017 at Unknown time  . CARTIA XT 180 MG 24 hr capsule TAKE 1 CAPSULE(180 MG) BY MOUTH DAILY 90 capsule 3 11/07/2017 at Unknown time  . carvedilol (COREG) 25 MG tablet Take 25 mg by mouth 2 (two) times daily with a meal.    11/08/2017 at 8-1000a  . clopidogrel (PLAVIX) 75 MG tablet Take 1 tablet (75 mg total) by mouth daily. 90 tablet 3 11/08/2017 at Unknown time  . fluticasone (FLONASE) 50 MCG/ACT nasal spray Place 2 sprays daily into both nostrils. 16 g 2 11/07/2017 at Unknown time  .  furosemide (LASIX) 80 MG tablet Take 1 tablet (80 mg total) by mouth 2 (two) times daily. 180 tablet 1 11/08/2017 at Unknown time  . gabapentin (NEURONTIN) 300 MG capsule Take 300 mg by mouth at bedtime (Patient taking differently: Take 300 mg by mouth at bedtime. ) 90 capsule 3 11/07/2017 at Unknown time  . hydrALAZINE (APRESOLINE) 100 MG tablet Take 1 tablet (100 mg total) by mouth 3 (three) times daily. 270 tablet 3 11/08/2017 at Unknown time  . liraglutide (VICTOZA) 18 MG/3ML SOPN Inject 0.1 mLs (0.6 mg total) into the skin daily. 6 mL 3 Past Week at Unknown time  . pravastatin (PRAVACHOL) 40 MG tablet Take 1 tablet (40 mg total) by mouth daily. 90 tablet 3 11/08/2017 at Unknown time  . promethazine (PHENERGAN) 25 MG tablet Take 1 tablet (25 mg total) by mouth every 6 (six) hours as needed for nausea or vomiting. 12 tablet 0 Past Week at Unknown time  . Insulin Pen Needle (NOVOFINE PLUS) 32G X 4 MM MISC  1 Dose by Does not apply route daily. For Victoza, substitution ok for cheaper brand 30 each 3     Assessment: 62 yo male presented to ED with SOB with presumed PE. Starting heparin. Heparin level therapeutic at 0.64.    Goal of Therapy:  Heparin level 0.3-0.7 units/ml Monitor platelets by anticoagulation protocol: Yes   Plan:  Continue heparin infusion @ 1600 units/hr Check anti-Xa level daily while on heparin Continue to monitor H&H and platelets  Revonda Standard Azha Constantin 11/09/2017,11:26 AM

## 2017-11-09 NOTE — Care Management Obs Status (Signed)
[place] NOTIFICATION   Patient Details  Name: Charles Hart MRN: [id number] Date of Birth: 03-22-1956   Medicare Observation Status Notification Given:  Yes    Sherald Barge, RN 11/09/2017, 2:29 PM

## 2017-11-09 NOTE — Progress Notes (Signed)
RN reviewed discharge instructions with pt verb understanding. Pt left via wheelchair in NAD with all belongings.

## 2017-11-09 NOTE — Discharge Summary (Signed)
Physician Discharge Summary  Patient ID: Charles Hart MRN: [id number] DOB/AGE: 10/22/1955 62 y.o.  Admit date: 11/08/2017 Discharge date: 11/09/2017  Admission Diagnoses:   Dyspnea   CKD stage IV   HTN   DM2   Hx CVA   Chronic pain syndrome   HL   Chronic systolic CHF     Discharge Diagnoses:  Active Problems:   Dyspnea   CKD stage IV   HTN   DM2   Hx CVA   Chronic pain syndrome   HL   Chronic systolic CHF   Discharged Condition: good  Hospital Course:  Charles Hart is a 62 y.o. male past medical history significant for heart failure, diabetes, dyspnea, heart attack, untreated renal tumor that is possible malignancy.  Presented to emergency room with shortness of breath.  Patient denies bleeding volume overloaded.  States he is compliant with all medications.  States his dry weight is 213 pounds.  Has sudden onset of shortness of breath that was severe.  No history of past episodes.  No history of blood clots.  Denies any trauma. No recent URI. ED Course: CXR neg. Labs showed AKI on CKD. Minimal trop elevation. Hospitalist consulted for admit.  Home meds: - cardizem 24 hr 180 qd/ coreg 25 bid/ lasix 80 bid/ hydralazine 100 tid - liraglutide 0.6 mg sq qd - rocaltrol/ plavix/ flonase/ neurontin 300 hs/ pravachol/ phenergan prn     Impression/Plan:  Active Problems:   Dyspnea  1) SOB/ dyspnea: with exertion mostly.  We did a complete pulm w/u which was negative including a normal CXR, normal noncon chest CT, and neg VQ scan. Has known dec'd LVEF 20- 25% but not vol overloaded here on exam or by imaging.  No sig trop elevation.  Not having CP.  Had heart cath x 2 in 2002 and 2008 w/ normal cors, f/b Dr [name], last seen Aug 2018.  Ambulating SpO2 just now is 100% at rest and 98- 99% walking in the halls.  OK for dc.  If pt's symptoms persist or dont' resolve I suggested he check in with his heart doctor (low EF symptoms, and/or new CAD w/o chest pain).  Pt agrees and  is ready for dc today.   - cont home meds - f/u cardiologist if not improving  - come back to hospital if symptoms are severe  2) Hx CKD stage IV: creat not far off from baseline 4.5, no uremic symptoms. Has f/u with his nephrologist Dr Lorrene Reid.   3)  Hx CVA 2013 on plavix  4) HTN : cont BP meds x 3 + lasix  5)  DM2 : on Victoza shots only at home  6)  CHF chronic systolic: as above, compensated  7)  Low K: replacing  8)  Chronic pain: cont meds   Discharge Exam: Blood pressure 131/70, pulse 66, temperature 98.1 F (36.7 C), resp. rate 20, height 6' (1.829 m), weight 94.9 kg (209 lb 3.5 oz), SpO2 97 %. Exam:  General:  Appears calm and comfortable; A&Ox3  Eyes:  PERRL, EOMI, normal lids, iris  ENT:  grossly normal hearing, lips & tongue  Neck:  no LAD, masses or thyromegaly, no JVD  Cardiovascular:  RRR, no m/r/g. No LE edema. Symmetric pulses Rad, DP, PT  Respiratory:  CTA bilaterally, no w/r/r, no ^wob, calm, on 2L [place]  Abdomen:  soft, ntnd, mod obese, no ascites or hsm  Skin:  no rash or induration seen on limited exam  Musculoskeletal:  grossly normal tone BUE/BLE  Psychiatric:  grossly normal mood and affect, speech fluent and appropriate  Neurologic:  CN 2-12 grossly intact, moves all extremities in coordinated fashion.    Disposition: Discharge disposition: 01-Home or Self Care     Home or Self-Care   Allergies as of 11/09/2017      Reactions   Ivp Dye [iodinated Diagnostic Agents] Other (See Comments)   Per patient's Nephrologist, he doesn't want the patient exposed to ANY dye because of issues with his kidneys   Amlodipine Swelling   Lisinopril Cough   Red Dye Other (See Comments)   NO dye of any kind (issues with his kidneys)      Medication List    TAKE these medications   calcitRIOL 0.25 MCG capsule Commonly known as:  ROCALTROL Take 0.25 mcg by mouth every Monday, Wednesday, and Friday.   CARTIA XT 180 MG 24 hr capsule Generic drug:   diltiazem TAKE 1 CAPSULE(180 MG) BY MOUTH DAILY   carvedilol 25 MG tablet Commonly known as:  COREG Take 25 mg by mouth 2 (two) times daily with a meal.   clopidogrel 75 MG tablet Commonly known as:  PLAVIX Take 1 tablet (75 mg total) by mouth daily.   fluticasone 50 MCG/ACT nasal spray Commonly known as:  FLONASE Place 2 sprays daily into both nostrils.   furosemide 80 MG tablet Commonly known as:  LASIX Take 1 tablet (80 mg total) by mouth 2 (two) times daily.   gabapentin 300 MG capsule Commonly known as:  NEURONTIN Take 300 mg by mouth at bedtime What changed:    how much to take  how to take this  when to take this  additional instructions   hydrALAZINE 100 MG tablet Commonly known as:  APRESOLINE Take 1 tablet (100 mg total) by mouth 3 (three) times daily.   Insulin Pen Needle 32G X 4 MM Misc Commonly known as:  NOVOFINE PLUS 1 Dose by Does not apply route daily. For Victoza, substitution ok for cheaper brand   liraglutide 18 MG/3ML Sopn Commonly known as:  VICTOZA Inject 0.1 mLs (0.6 mg total) into the skin daily.   pravastatin 40 MG tablet Commonly known as:  PRAVACHOL Take 1 tablet (40 mg total) by mouth daily.   promethazine 25 MG tablet Commonly known as:  PHENERGAN Take 1 tablet (25 mg total) by mouth every 6 (six) hours as needed for nausea or vomiting.        Signed: Sandy Salaam Leibish Mcgregor 11/09/2017, 5:28 PM

## 2017-11-09 NOTE — Progress Notes (Signed)
SATURATION QUALIFICATIONS: (This note is used to comply with regulatory documentation for home oxygen)  Patient Saturations on Room Air at Rest = 100%  Patient Saturations on Room Air while Ambulating = 100%  Patient Saturations on N/A Liters of oxygen while Ambulating = N/A  Please briefly explain why patient needs home oxygen:

## 2017-11-13 DIAGNOSIS — D631 Anemia in chronic kidney disease: Secondary | ICD-10-CM | POA: Diagnosis not present

## 2017-11-13 DIAGNOSIS — E1122 Type 2 diabetes mellitus with diabetic chronic kidney disease: Secondary | ICD-10-CM | POA: Diagnosis not present

## 2017-11-13 DIAGNOSIS — N189 Chronic kidney disease, unspecified: Secondary | ICD-10-CM | POA: Diagnosis not present

## 2017-11-13 DIAGNOSIS — I5040 Unspecified combined systolic (congestive) and diastolic (congestive) heart failure: Secondary | ICD-10-CM | POA: Diagnosis not present

## 2017-11-13 DIAGNOSIS — N184 Chronic kidney disease, stage 4 (severe): Secondary | ICD-10-CM | POA: Diagnosis not present

## 2017-11-13 DIAGNOSIS — I129 Hypertensive chronic kidney disease with stage 1 through stage 4 chronic kidney disease, or unspecified chronic kidney disease: Secondary | ICD-10-CM | POA: Diagnosis not present

## 2017-11-13 DIAGNOSIS — N2581 Secondary hyperparathyroidism of renal origin: Secondary | ICD-10-CM | POA: Diagnosis not present

## 2017-11-13 DIAGNOSIS — E1322 Other specified diabetes mellitus with diabetic chronic kidney disease: Secondary | ICD-10-CM | POA: Diagnosis not present

## 2017-11-13 DIAGNOSIS — E785 Hyperlipidemia, unspecified: Secondary | ICD-10-CM | POA: Diagnosis not present

## 2017-11-13 DIAGNOSIS — N2889 Other specified disorders of kidney and ureter: Secondary | ICD-10-CM | POA: Diagnosis not present

## 2017-11-13 DIAGNOSIS — E669 Obesity, unspecified: Secondary | ICD-10-CM | POA: Diagnosis not present

## 2017-11-16 ENCOUNTER — Telehealth: Payer: Self-pay | Admitting: Pharmacist

## 2017-11-16 NOTE — Progress Notes (Signed)
Transition Care Management Follow-up Telephone Call   Date discharged? 11/09/17   How have you been since you were released from the hospital? Feeling well, no concerns at this time   Do you understand why you were in the hospital? yes   Do you understand the discharge instructions? yes   Where were you discharged to? home  Items Reviewed:  Medications reviewed: yes, however patient is not aware of calcitriol, patient may benefit from help with medications, will schedule [facility] appointment  Allergies reviewed: yes  Dietary changes reviewed: yes  Referrals reviewed: yes  Functional Questionnaire:   Activities of Daily Living (ADLs):   He states they are independent in the following: all activities States they require assistance with the following: none   Any transportation issues/concerns?: no   Any patient concerns? no   Confirmed importance and date/time of follow-up visits scheduled yes  Provider Appointment---will send not to front desk for scheduling  Confirmed with patient if condition begins to worsen call PCP or go to the ER.  Patient was given the office number and encouraged to call back with question or concerns.  : yes

## 2017-11-21 ENCOUNTER — Ambulatory Visit (INDEPENDENT_AMBULATORY_CARE_PROVIDER_SITE_OTHER): Payer: PPO | Admitting: Vascular Surgery

## 2017-11-21 ENCOUNTER — Other Ambulatory Visit: Payer: Self-pay

## 2017-11-21 ENCOUNTER — Encounter: Payer: Self-pay | Admitting: Vascular Surgery

## 2017-11-21 ENCOUNTER — Encounter: Payer: Self-pay | Admitting: *Deleted

## 2017-11-21 ENCOUNTER — Other Ambulatory Visit: Payer: Self-pay | Admitting: *Deleted

## 2017-11-21 VITALS — BP 178/80 | HR 66 | Resp 20 | Ht 72.0 in | Wt 223.0 lb

## 2017-11-21 DIAGNOSIS — N184 Chronic kidney disease, stage 4 (severe): Secondary | ICD-10-CM

## 2017-11-21 NOTE — Progress Notes (Signed)
Vascular and Vein Specialist of [place]  Patient name: Charles [facility] MRN: [id number] DOB: 03-23-1956 Sex: male  REASON FOR VISIT: Discuss access for hemodialysis  HPI: Charles [facility] is a 62 y.o. male here today for discussion of access for hemodialysis.  I had seen him in June 2018 for initial consultation.  At that time his noninvasive studies showed that his native veins were felt to be too small for access.  He now has had progressive renal insufficiency and is approaching need for hemodialysis.  We have been asked to see him and place a new access as soon as possible.  He does report prior stroke leaving him with some tingling in his left hand but has had no ischemic symptoms.  Past Medical History:  Diagnosis Date  . Adenomatous colon polyp 07/02/2011   Last colonoscopy May 06, 2011 by Dr. Owens Loffler, who recommended repeat colonoscopy in 5 years.   . Background diabetic retinopathy 04/20/2012   Patient is followed by Dr. Katy Fitch   . Cardiomyopathy    LV function improved from 2004 to 2008.  Historically, moderately dilated LV with EF 30-40% by 2D echo 08/14/2002.  Mild CAD with severe LV dysfunction by cardiac cath 09/2002.  Normal coronary arteries and normal LV function by cardiac cath 09/19/2006.  A 2-D echo on 04/01/2009 showed mild concentric hypertrophy and normal systolic (LVEF  82-99%) and doppler C/W with grade 1 diastolic dysfunction.  . CHF (congestive heart failure) ([place])    LV function improved from 2004 to 2008.  Historically, moderately dilated LV with EF 30-40% by 2D echo 08/14/2002.  Mild CAD with severe LV dysfunction by cardiac cath 09/2002.  Normal coronary arteries and normal LV function by cardiac cath 09/19/2006.  A 2-D echo on 04/01/2009 showed mild concentric hypertrophy and normal systolic (LVEF  37-16%) and doppler C/W with grade 1 diastolic dysfunction..   . Chronic combined systolic and diastolic congestive heart  failure ([place]) 05/21/2010   LV function improved from 2004 to 2008.  Historically, moderately dilated LV with EF 30-40% by 2D echo 08/14/2002.  Mild CAD with severe LV dysfunction by cardiac cath 09/2002.  Normal coronary arteries and normal LV function by cardiac cath 09/19/2006.  A 2-D echo on 04/01/2009 showed mild concentric hypertrophy and normal systolic (LVEF  96-78%) and doppler parameters consistent with abnormal left   . CVA (cerebral vascular accident) ([place]) 07/04/2012   MRI of the brain 07/04/2012 showed an acute infarct in the right basal ganglia involving the anterior putamen, anterior limb internal capsule, and head of the caudate; this measured approximately 2.5 cm in diameter.     . Dermatitis   . Diabetes mellitus    type 2  . DIABETIC PERIPHERAL NEUROPATHY 08/03/2007   Qualifier: Diagnosis of  By: Marinda Elk MD, Sonia Side    . DM neuropathy, painful ([place])   . Dyspnea   . Hearing loss in right ear   . Hyperlipidemia   . Hypertension   . Hypertensive crisis 07/28/2012  . Hypertensive urgency 08/20/2014  . Myocardial infarction ([place])   . Nephrotic syndrome 02/18/2013   A 24-hour urine collection 03/04/2013 showed total protein of 5,460 g and creatinine clearance of 80 mL/minute.  Patient was seen by Seward Meth at [facility] and a repeat 24-hour urine showed 10,407 mg protein.  Patient underwent kidney biopsy on 05/30/2013; pathology showed advanced diffuse and nodular diabetic nephropathy with vascular changes consistent with long-standing difficult to control hypertension.     [name]  Renal insufficiency     Family History  Problem Relation Age of Onset  . Aneurysm Father 55       died of rupture  . Colon cancer Sister     SOCIAL HISTORY: Social History   Tobacco Use  . Smoking status: Never Smoker  . Smokeless tobacco: Never Used  Substance Use Topics  . Alcohol use: No    Alcohol/week: 0.0 oz    Comment: Wine rarely.    Allergies  Allergen  Reactions  . Ivp Dye [Iodinated Diagnostic Agents] Other (See Comments)    Per patient's Nephrologist, he doesn't want the patient exposed to ANY dye because of issues with his kidneys  . Amlodipine Swelling  . Lisinopril Cough  . Red Dye Other (See Comments)    NO dye of any kind (issues with his kidneys)    Current Outpatient Medications  Medication Sig Dispense Refill  . calcitRIOL (ROCALTROL) 0.25 MCG capsule Take 0.25 mcg by mouth every Monday, Wednesday, and Friday.   4  . CARTIA XT 180 MG 24 hr capsule TAKE 1 CAPSULE(180 MG) BY MOUTH DAILY 90 capsule 3  . carvedilol (COREG) 25 MG tablet Take 25 mg by mouth 2 (two) times daily with a meal.     . clopidogrel (PLAVIX) 75 MG tablet Take 1 tablet (75 mg total) by mouth daily. 90 tablet 3  . fluticasone (FLONASE) 50 MCG/ACT nasal spray Place 2 sprays daily into both nostrils. 16 g 2  . furosemide (LASIX) 80 MG tablet Take 1 tablet (80 mg total) by mouth 2 (two) times daily. 180 tablet 1  . gabapentin (NEURONTIN) 300 MG capsule Take 300 mg by mouth at bedtime (Patient taking differently: Take 300 mg by mouth at bedtime. ) 90 capsule 3  . hydrALAZINE (APRESOLINE) 100 MG tablet Take 1 tablet (100 mg total) by mouth 3 (three) times daily. 270 tablet 3  . Insulin Pen Needle (NOVOFINE PLUS) 32G X 4 MM MISC 1 Dose by Does not apply route daily. For Victoza, substitution ok for cheaper brand 30 each 3  . liraglutide (VICTOZA) 18 MG/3ML SOPN Inject 0.1 mLs (0.6 mg total) into the skin daily. 6 mL 3  . pravastatin (PRAVACHOL) 40 MG tablet Take 1 tablet (40 mg total) by mouth daily. 90 tablet 3  . promethazine (PHENERGAN) 25 MG tablet Take 1 tablet (25 mg total) by mouth every 6 (six) hours as needed for nausea or vomiting. 12 tablet 0   No current facility-administered medications for this visit.     REVIEW OF SYSTEMS:  [X]  denotes positive finding, [ ]  denotes negative finding Cardiac  Comments:  Chest pain or chest pressure:    Shortness of  breath upon exertion:    Short of breath when lying flat:    Irregular heart rhythm:        Vascular    Pain in calf, thigh, or hip brought on by ambulation:    Pain in feet at night that wakes you up from your sleep:     Blood clot in your veins:    Leg swelling:           PHYSICAL EXAM: Vitals:   11/21/17 1502  BP: (!) 178/80  Pulse: 66  Resp: 20  SpO2: 98%  Weight: 223 lb (101.2 kg)  Height: 6' (1.829 m)    GENERAL: The patient is a well-nourished male, in no acute distress. The vital signs are documented above. CARDIOVASCULAR: 2+ radial and 2+ ulnar pulses bilaterally.  I do see his cephalic veins bilaterally at the forearm and wrist.  They are relatively superficial but appear to be small PULMONARY: There is good air exchange  MUSCULOSKELETAL: There are no major deformities or cyanosis. NEUROLOGIC: No focal weakness or paresthesias are detected. SKIN: There are no ulcers or rashes noted. PSYCHIATRIC: The patient has a normal affect.  DATA:  Reviewed his prior duplex from June 2018 showing a small surface veins but normal arterial flow in his arms bilaterally  MEDICAL ISSUES: I have recommended placement of a left arm AV graft since he is right-handed.  I did explain the risk of steal symptoms and also the slight risk of bleeding and infection.  I did also explain the ongoing maintenance required with graft thrombosis typically occurring.  Wished to proceed with outpatient surgery.  I explained that we are available as Vania Rosero as tomorrow or Friday to schedule.  He will determine his arrangements and let us know when he is was willing to proceed    Rosetta Posner, MD [facility] V[facility] and Vein Specialists of [facility] Tel [phone] Pager [phone]

## 2017-11-21 NOTE — H&P (View-Only) (Signed)
Vascular and Vein Specialist of [place]  Patient name: [facility] MRN: [id number] DOB: Nov 03, 1955 Sex: male  REASON FOR VISIT: Discuss access for hemodialysis  HPI: [facility] is a 62 y.o. male here today for discussion of access for hemodialysis.  I had seen him in June 2018 for initial consultation.  At that time his noninvasive studies showed that his native veins were felt to be too small for access.  He now has had progressive renal insufficiency and is approaching need for hemodialysis.  We have been asked to see him and place a new access as soon as possible.  He does report prior stroke leaving him with some tingling in his left hand but has had no ischemic symptoms.  Past Medical History:  Diagnosis Date  . Adenomatous colon polyp 07/02/2011   Last colonoscopy May 06, 2011 by Dr. Owens Loffler, who recommended repeat colonoscopy in 5 years.   . Background diabetic retinopathy 04/20/2012   Patient is followed by Dr. Katy Fitch   . Cardiomyopathy    LV function improved from 2004 to 2008.  Historically, moderately dilated LV with EF 30-40% by 2D echo 08/14/2002.  Mild CAD with severe LV dysfunction by cardiac cath 09/2002.  Normal coronary arteries and normal LV function by cardiac cath 09/19/2006.  A 2-D echo on 04/01/2009 showed mild concentric hypertrophy and normal systolic (LVEF  24-23%) and doppler C/W with grade 1 diastolic dysfunction.  . CHF (congestive heart failure) ([place])    LV function improved from 2004 to 2008.  Historically, moderately dilated LV with EF 30-40% by 2D echo 08/14/2002.  Mild CAD with severe LV dysfunction by cardiac cath 09/2002.  Normal coronary arteries and normal LV function by cardiac cath 09/19/2006.  A 2-D echo on 04/01/2009 showed mild concentric hypertrophy and normal systolic (LVEF  53-61%) and doppler C/W with grade 1 diastolic dysfunction..   . Chronic combined systolic and diastolic congestive heart  failure ([place]) 05/21/2010   LV function improved from 2004 to 2008.  Historically, moderately dilated LV with EF 30-40% by 2D echo 08/14/2002.  Mild CAD with severe LV dysfunction by cardiac cath 09/2002.  Normal coronary arteries and normal LV function by cardiac cath 09/19/2006.  A 2-D echo on 04/01/2009 showed mild concentric hypertrophy and normal systolic (LVEF  44-31%) and doppler parameters consistent with abnormal left   . CVA (cerebral vascular accident) ([place]) 07/04/2012   MRI of the brain 07/04/2012 showed an acute infarct in the right basal ganglia involving the anterior putamen, anterior limb internal capsule, and head of the caudate; this measured approximately 2.5 cm in diameter.     . Dermatitis   . Diabetes mellitus    type 2  . DIABETIC PERIPHERAL NEUROPATHY 08/03/2007   Qualifier: Diagnosis of  By: Marinda Elk MD, Sonia Side    . DM neuropathy, painful ([place])   . Dyspnea   . Hearing loss in right ear   . Hyperlipidemia   . Hypertension   . Hypertensive crisis 07/28/2012  . Hypertensive urgency 08/20/2014  . Myocardial infarction ([place])   . Nephrotic syndrome 02/18/2013   A 24-hour urine collection 03/04/2013 showed total protein of 5,460 g and creatinine clearance of 80 mL/minute.  Patient was seen by Seward Meth at [place] and a repeat 24-hour urine showed 10,407 mg protein.  Patient underwent kidney biopsy on 05/30/2013; pathology showed advanced diffuse and nodular diabetic nephropathy with vascular changes consistent with long-standing difficult to control hypertension.     [name]  Renal insufficiency     Family History  Problem Relation Age of Onset  . Aneurysm Father 54       died of rupture  . Colon cancer Sister     SOCIAL HISTORY: Social History   Tobacco Use  . Smoking status: Never Smoker  . Smokeless tobacco: Never Used  Substance Use Topics  . Alcohol use: No    Alcohol/week: 0.0 oz    Comment: Wine rarely.    Allergies  Allergen  Reactions  . Ivp Dye [Iodinated Diagnostic Agents] Other (See Comments)    Per patient's Nephrologist, he doesn't want the patient exposed to ANY dye because of issues with his kidneys  . Amlodipine Swelling  . Lisinopril Cough  . Red Dye Other (See Comments)    NO dye of any kind (issues with his kidneys)    Current Outpatient Medications  Medication Sig Dispense Refill  . calcitRIOL (ROCALTROL) 0.25 MCG capsule Take 0.25 mcg by mouth every Monday, Wednesday, and Friday.   4  . CARTIA XT 180 MG 24 hr capsule TAKE 1 CAPSULE(180 MG) BY MOUTH DAILY 90 capsule 3  . carvedilol (COREG) 25 MG tablet Take 25 mg by mouth 2 (two) times daily with a meal.     . clopidogrel (PLAVIX) 75 MG tablet Take 1 tablet (75 mg total) by mouth daily. 90 tablet 3  . fluticasone (FLONASE) 50 MCG/ACT nasal spray Place 2 sprays daily into both nostrils. 16 g 2  . furosemide (LASIX) 80 MG tablet Take 1 tablet (80 mg total) by mouth 2 (two) times daily. 180 tablet 1  . gabapentin (NEURONTIN) 300 MG capsule Take 300 mg by mouth at bedtime (Patient taking differently: Take 300 mg by mouth at bedtime. ) 90 capsule 3  . hydrALAZINE (APRESOLINE) 100 MG tablet Take 1 tablet (100 mg total) by mouth 3 (three) times daily. 270 tablet 3  . Insulin Pen Needle (NOVOFINE PLUS) 32G X 4 MM MISC 1 Dose by Does not apply route daily. For Victoza, substitution ok for cheaper brand 30 each 3  . liraglutide (VICTOZA) 18 MG/3ML SOPN Inject 0.1 mLs (0.6 mg total) into the skin daily. 6 mL 3  . pravastatin (PRAVACHOL) 40 MG tablet Take 1 tablet (40 mg total) by mouth daily. 90 tablet 3  . promethazine (PHENERGAN) 25 MG tablet Take 1 tablet (25 mg total) by mouth every 6 (six) hours as needed for nausea or vomiting. 12 tablet 0   No current facility-administered medications for this visit.     REVIEW OF SYSTEMS:  [X]  denotes positive finding, [ ]  denotes negative finding Cardiac  Comments:  Chest pain or chest pressure:    Shortness of  breath upon exertion:    Short of breath when lying flat:    Irregular heart rhythm:        Vascular    Pain in calf, thigh, or hip brought on by ambulation:    Pain in feet at night that wakes you up from your sleep:     Blood clot in your veins:    Leg swelling:           PHYSICAL EXAM: Vitals:   11/21/17 1502  BP: (!) 178/80  Pulse: 66  Resp: 20  SpO2: 98%  Weight: 223 lb (101.2 kg)  Height: 6' (1.829 m)    GENERAL: The patient is a well-nourished male, in no acute distress. The vital signs are documented above. CARDIOVASCULAR: 2+ radial and 2+ ulnar pulses bilaterally.  I do see his cephalic veins bilaterally at the forearm and wrist.  They are relatively superficial but appear to be small PULMONARY: There is good air exchange  MUSCULOSKELETAL: There are no major deformities or cyanosis. NEUROLOGIC: No focal weakness or paresthesias are detected. SKIN: There are no ulcers or rashes noted. PSYCHIATRIC: The patient has a normal affect.  DATA:  Reviewed his prior duplex from June 2018 showing a small surface veins but normal arterial flow in his arms bilaterally  MEDICAL ISSUES: I have recommended placement of a left arm AV graft since he is right-handed.  I did explain the risk of steal symptoms and also the slight risk of bleeding and infection.  I did also explain the ongoing maintenance required with graft thrombosis typically occurring.  Wished to proceed with outpatient surgery.  I explained that we are available as Nico Syme as tomorrow or Friday to schedule.  He will determine his arrangements and let us know when he is was willing to proceed    Rosetta Posner, MD [facility] Vascular and Vein Specialists of [facility] Tel [phone] Pager [phone]

## 2017-11-23 ENCOUNTER — Other Ambulatory Visit: Payer: Self-pay

## 2017-11-23 ENCOUNTER — Encounter (HOSPITAL_COMMUNITY): Payer: Self-pay | Admitting: *Deleted

## 2017-11-23 NOTE — Progress Notes (Signed)
Anesthesia Chart Review:  Pt is a same day work up.   Pt is a 62 year old male scheduled for insertion of L arm AV gore-tex graft on 11/24/2017 with Curt Jews, MD  - PCP is Joni Reining, DO - Cardiologist is [name], MD.  Last office visit 03/22/17; 6 month f/u recommended  - Nephrologist is [name], MD  PMH includes:  CHF, cardiomyopathy (EF 25-30%), CVA (2013), HTN, DM, hyperlipidemia, anemia, OSA, CKD (not yet on dialysis), left renal mass (suspicious for malignancy, pt has not decided if he wishes to pursue work up). BMI 30  - Hospitalized 3/27-3/28/19 for dyspnea (cause not identified- normal CXR, normal noncon chest CT, and neg VQ scan; no significant troponin elevation, no chest pain).   Medications include: Cartia XT, carvedilol, Plavix, Lasix, hydralazine, Victoza, pravastatin. Last dose plavix 11/21/17.   Labs will be obtained day of surgery.  - Baseline Cr in 4-5 range  CXR 11/08/17:  1. Low lung volumes. 2. No acute cardiopulmonary disease.  EKG 11/08/17: Sinus rhythm. LA enlargement. LVH with IVCD and secondary repol abnrm. Anterior ST elevation, probably due to LVH. Prolonged QT interval   CT chest 11/08/17:  1. Mild mosaic attenuation of the lungs, probably representing air trapping and small airways disease. 2. No consolidation, effusion, or pneumothorax. 3. Mild cardiomegaly. 4. Coronary artery and aortic calcific atherosclerosis.  CT abdomen/pelvis 05/03/17:  - No acute finding in the abdomen and pelvis by noncontrast imaging. - Slight enlargement of the largest left renal mass measuring up to 5.5 x 4.1 cm by noncontrast CT. This lesion appears concerning for renal cell carcinoma with compared to the MRI without contrast. - Other hepatic and renal lesions are grossly stable by noncontrast imaging. - Colonic diverticulosis - Abdominal atherosclerosis  Carotid duplex 03/30/17:  - Technically challenging study. - Heterogeneous plaque, bilaterally. - Stable  1-39% RICA stenosis. - Progression of LICA disease with higher velocities compared to prior exam, now in 40-59% range of stenosis. - >50% RECA stenosis. - Elevated velocities in the right subclavian artery. - Normal left subclavian artery. - Patent vertebral arteries with antegrade flow.  Echo 02/23/17:  - Left ventricle: The cavity size was severely dilated. Wall thickness was increased in a pattern of moderate LVH. Systolic function was severely reduced. The estimated ejection fraction was in the range of 25% to 30%. Diffuse hypokinesis. Features are consistent with a pseudonormal left ventricular filling pattern, with concomitant abnormal relaxation and increased filling pressure (grade 2 diastolic dysfunction). Doppler parameters are consistent with high ventricular filling pressure. - Aortic valve: There was trivial regurgitation. - Aortic root: The aortic root was mildly dilated. - Mitral valve: Calcified annulus. There was mild regurgitation. - Left atrium: The atrium was moderately dilated. - Right atrium: The atrium was moderately dilated. - Impressions: Severe global reduction in LV systolic function; severe LVE; moderate LVH; moderate diastolic dysfunction with elevated LV filling pressure; mildly dilated aortic root; trace AI; mild MR; biatrial enlargement.  Cardiac cath 09/19/06:  - The patient does not have significant coronary disease (normal coronaries).  He has normal left ventricular function  If no acute CV symptoms and labs acceptable day of surgery, I anticipate pt can proceed with surgery as scheduled.   Willeen Cass, FNP-BC [facility]/Anesthesiology Phone: [phone] 11/23/2017 11:11 AM

## 2017-11-23 NOTE — Progress Notes (Signed)
Spoke with pt for pre-op call. Pt has hx of CAD with an MI "several years ago". Dr. [name] is his cardiologist. Pt denies any recent chest pain or sob. Pt is on Plavix (history of stroke) was instructed to hold it prior to surgery by Dr. Donnetta Hutching. Last dose was 11/21/17.  Pt is a type 2 diabetic, last A1C was 4.5 on 09/07/17. States he does not check his blood sugar at home.  Pt states he does not have anyone that can take him home and be with him the 24 hours after surgery. I called the office and spoke with Jacqlyn Larsen, RN to notify her that he would need to stay the night.

## 2017-11-24 ENCOUNTER — Ambulatory Visit (HOSPITAL_COMMUNITY): Payer: PPO | Admitting: Emergency Medicine

## 2017-11-24 ENCOUNTER — Encounter (HOSPITAL_COMMUNITY): Payer: Self-pay | Admitting: *Deleted

## 2017-11-24 ENCOUNTER — Observation Stay (HOSPITAL_COMMUNITY)
Admission: RE | Admit: 2017-11-24 | Discharge: 2017-11-25 | Disposition: A | Discharge status: Home / Self Care | Payer: PPO | Source: Ambulatory Visit | Attending: Vascular Surgery | Admitting: Vascular Surgery

## 2017-11-24 ENCOUNTER — Encounter (HOSPITAL_COMMUNITY)
Admission: RE | Disposition: A | Discharge status: Home / Self Care | Payer: Self-pay | Source: Ambulatory Visit | Attending: Vascular Surgery

## 2017-11-24 DIAGNOSIS — E11319 Type 2 diabetes mellitus with unspecified diabetic retinopathy without macular edema: Secondary | ICD-10-CM | POA: Insufficient documentation

## 2017-11-24 DIAGNOSIS — Z91041 Radiographic dye allergy status: Secondary | ICD-10-CM | POA: Insufficient documentation

## 2017-11-24 DIAGNOSIS — I252 Old myocardial infarction: Secondary | ICD-10-CM | POA: Diagnosis not present

## 2017-11-24 DIAGNOSIS — I429 Cardiomyopathy, unspecified: Secondary | ICD-10-CM | POA: Insufficient documentation

## 2017-11-24 DIAGNOSIS — D631 Anemia in chronic kidney disease: Secondary | ICD-10-CM | POA: Diagnosis not present

## 2017-11-24 DIAGNOSIS — I132 Hypertensive heart and chronic kidney disease with heart failure and with stage 5 chronic kidney disease, or end stage renal disease: Secondary | ICD-10-CM | POA: Diagnosis not present

## 2017-11-24 DIAGNOSIS — E1142 Type 2 diabetes mellitus with diabetic polyneuropathy: Secondary | ICD-10-CM | POA: Insufficient documentation

## 2017-11-24 DIAGNOSIS — Z9889 Other specified postprocedural states: Secondary | ICD-10-CM | POA: Diagnosis present

## 2017-11-24 DIAGNOSIS — H9191 Unspecified hearing loss, right ear: Secondary | ICD-10-CM | POA: Insufficient documentation

## 2017-11-24 DIAGNOSIS — Z86010 Personal history of colonic polyps: Secondary | ICD-10-CM | POA: Diagnosis not present

## 2017-11-24 DIAGNOSIS — I5042 Chronic combined systolic (congestive) and diastolic (congestive) heart failure: Secondary | ICD-10-CM | POA: Insufficient documentation

## 2017-11-24 DIAGNOSIS — Z8249 Family history of ischemic heart disease and other diseases of the circulatory system: Secondary | ICD-10-CM | POA: Diagnosis not present

## 2017-11-24 DIAGNOSIS — Z794 Long term (current) use of insulin: Secondary | ICD-10-CM | POA: Diagnosis not present

## 2017-11-24 DIAGNOSIS — Z800 Family history of malignant neoplasm of digestive organs: Secondary | ICD-10-CM | POA: Diagnosis not present

## 2017-11-24 DIAGNOSIS — E785 Hyperlipidemia, unspecified: Secondary | ICD-10-CM | POA: Insufficient documentation

## 2017-11-24 DIAGNOSIS — Z992 Dependence on renal dialysis: Secondary | ICD-10-CM

## 2017-11-24 DIAGNOSIS — Z888 Allergy status to other drugs, medicaments and biological substances status: Secondary | ICD-10-CM | POA: Insufficient documentation

## 2017-11-24 DIAGNOSIS — I2510 Atherosclerotic heart disease of native coronary artery without angina pectoris: Secondary | ICD-10-CM | POA: Diagnosis not present

## 2017-11-24 DIAGNOSIS — E1122 Type 2 diabetes mellitus with diabetic chronic kidney disease: Secondary | ICD-10-CM | POA: Diagnosis not present

## 2017-11-24 DIAGNOSIS — Z8673 Personal history of transient ischemic attack (TIA), and cerebral infarction without residual deficits: Secondary | ICD-10-CM | POA: Insufficient documentation

## 2017-11-24 DIAGNOSIS — Z79899 Other long term (current) drug therapy: Secondary | ICD-10-CM | POA: Diagnosis not present

## 2017-11-24 DIAGNOSIS — N186 End stage renal disease: Secondary | ICD-10-CM | POA: Diagnosis present

## 2017-11-24 DIAGNOSIS — N185 Chronic kidney disease, stage 5: Secondary | ICD-10-CM | POA: Diagnosis not present

## 2017-11-24 HISTORY — DX: End stage renal disease: N18.6

## 2017-11-24 HISTORY — PX: AV FISTULA PLACEMENT: SHX1204

## 2017-11-24 HISTORY — DX: Pneumonia, unspecified organism: J18.9

## 2017-11-24 HISTORY — DX: Anemia, unspecified: D64.9

## 2017-11-24 HISTORY — DX: Sleep apnea, unspecified: G47.30

## 2017-11-24 LAB — POCT I-STAT 4, (NA,K, GLUC, HGB,HCT)
Glucose, Bld: 123 mg/dL — ABNORMAL HIGH (ref 65–99)
HEMATOCRIT: 28 % — AB (ref 39.0–52.0)
HEMOGLOBIN: 9.5 g/dL — AB (ref 13.0–17.0)
Potassium: 3.8 mmol/L (ref 3.5–5.1)
Sodium: 142 mmol/L (ref 135–145)

## 2017-11-24 LAB — GLUCOSE, CAPILLARY
GLUCOSE-CAPILLARY: 122 mg/dL — AB (ref 65–99)
Glucose-Capillary: 128 mg/dL — ABNORMAL HIGH (ref 65–99)
Glucose-Capillary: 159 mg/dL — ABNORMAL HIGH (ref 65–99)
Glucose-Capillary: 152 mg/dL — ABNORMAL HIGH (ref 65–99)

## 2017-11-24 SURGERY — INSERTION OF ARTERIOVENOUS (AV) GORE-TEX GRAFT ARM
Anesthesia: Monitor Anesthesia Care | Site: Arm Lower | Laterality: Left

## 2017-11-24 MED ORDER — MIDAZOLAM HCL 2 MG/2ML IJ SOLN
INTRAMUSCULAR | Status: AC
  Filled 2017-11-24: qty 2

## 2017-11-24 MED ORDER — FENTANYL CITRATE (PF) 250 MCG/5ML IJ SOLN
INTRAMUSCULAR | Status: AC
  Filled 2017-11-24: qty 5

## 2017-11-24 MED ORDER — HYDRALAZINE HCL 50 MG PO TABS
100.0000 mg | ORAL_TABLET | Freq: Three times a day (TID) | ORAL | Status: DC
  Administered 2017-11-24 – 2017-11-25 (×3): 100 mg via ORAL
  Filled 2017-11-24 (×3): qty 2

## 2017-11-24 MED ORDER — LIDOCAINE HCL (CARDIAC) 20 MG/ML IV SOLN
INTRAVENOUS | Status: DC | PRN
  Administered 2017-11-24: 100 mg via INTRAVENOUS

## 2017-11-24 MED ORDER — PROPOFOL 500 MG/50ML IV EMUL
INTRAVENOUS | Status: DC | PRN
  Administered 2017-11-24: 50 ug/kg/min via INTRAVENOUS

## 2017-11-24 MED ORDER — ONDANSETRON HCL 4 MG/2ML IJ SOLN
INTRAMUSCULAR | Status: DC | PRN
  Administered 2017-11-24: 4 mg via INTRAVENOUS

## 2017-11-24 MED ORDER — PROPOFOL 10 MG/ML IV BOLUS
INTRAVENOUS | Status: AC
  Filled 2017-11-24: qty 20

## 2017-11-24 MED ORDER — PRAVASTATIN SODIUM 40 MG PO TABS
40.0000 mg | ORAL_TABLET | Freq: Every day | ORAL | Status: DC
  Administered 2017-11-24 – 2017-11-25 (×2): 40 mg via ORAL
  Filled 2017-11-24 (×2): qty 1

## 2017-11-24 MED ORDER — HYDROCODONE-ACETAMINOPHEN 5-325 MG PO TABS: 1.0000 | ORAL_TABLET | Freq: Four times a day (QID) | ORAL | 0 refills | Status: DC | PRN

## 2017-11-24 MED ORDER — 0.9 % SODIUM CHLORIDE (POUR BTL) OPTIME
TOPICAL | Status: DC | PRN
  Administered 2017-11-24: 1000 mL

## 2017-11-24 MED ORDER — LIDOCAINE-EPINEPHRINE 0.5 %-1:200000 IJ SOLN
INTRAMUSCULAR | Status: DC | PRN
  Administered 2017-11-24: 13 mL

## 2017-11-24 MED ORDER — OXYCODONE HCL 5 MG PO TABS: 5.0000 mg | ORAL_TABLET | Freq: Once | ORAL | Status: DC | PRN

## 2017-11-24 MED ORDER — LABETALOL HCL 5 MG/ML IV SOLN
10.0000 mg | INTRAVENOUS | Status: DC | PRN
  Filled 2017-11-24: qty 4

## 2017-11-24 MED ORDER — SODIUM CHLORIDE 0.9 % IV SOLN
INTRAVENOUS | Status: AC
  Filled 2017-11-24: qty 1.2

## 2017-11-24 MED ORDER — OXYCODONE HCL 5 MG/5ML PO SOLN: 5.0000 mg | Freq: Once | ORAL | Status: DC | PRN

## 2017-11-24 MED ORDER — CALCITRIOL 0.25 MCG PO CAPS
0.2500 ug | ORAL_CAPSULE | ORAL | Status: DC
  Administered 2017-11-24: 0.25 ug via ORAL
  Filled 2017-11-24: qty 1

## 2017-11-24 MED ORDER — LIDOCAINE-EPINEPHRINE 0.5 %-1:200000 IJ SOLN
INTRAMUSCULAR | Status: AC
  Filled 2017-11-24: qty 1

## 2017-11-24 MED ORDER — DEXAMETHASONE SODIUM PHOSPHATE 10 MG/ML IJ SOLN
INTRAMUSCULAR | Status: DC | PRN
  Administered 2017-11-24: 5 mg via INTRAVENOUS

## 2017-11-24 MED ORDER — FLUTICASONE PROPIONATE 50 MCG/ACT NA SUSP
2.0000 | Freq: Every day | NASAL | Status: DC
  Administered 2017-11-25: 2 via NASAL
  Filled 2017-11-24: qty 16

## 2017-11-24 MED ORDER — OXYCODONE-ACETAMINOPHEN 5-325 MG PO TABS
1.0000 | ORAL_TABLET | ORAL | Status: DC | PRN
  Administered 2017-11-24: 1 via ORAL
  Filled 2017-11-24: qty 1

## 2017-11-24 MED ORDER — FUROSEMIDE 80 MG PO TABS
80.0000 mg | ORAL_TABLET | Freq: Two times a day (BID) | ORAL | Status: DC
  Administered 2017-11-25: 80 mg via ORAL
  Filled 2017-11-24: qty 1

## 2017-11-24 MED ORDER — INSULIN ASPART 100 UNIT/ML ~~LOC~~ SOLN
0.0000 [IU] | Freq: Three times a day (TID) | SUBCUTANEOUS | Status: DC
  Administered 2017-11-25: 2 [IU] via SUBCUTANEOUS

## 2017-11-24 MED ORDER — PROPOFOL 1000 MG/100ML IV EMUL
INTRAVENOUS | Status: AC
Start: 2017-11-24 — End: ?
  Filled 2017-11-24: qty 100

## 2017-11-24 MED ORDER — HYDRALAZINE HCL 20 MG/ML IJ SOLN: 5.0000 mg | INTRAMUSCULAR | Status: DC | PRN

## 2017-11-24 MED ORDER — PROMETHAZINE HCL 25 MG PO TABS
25.0000 mg | ORAL_TABLET | Freq: Four times a day (QID) | ORAL | Status: DC | PRN
Start: 2017-11-24 — End: 2017-11-25

## 2017-11-24 MED ORDER — HYDROMORPHONE HCL 1 MG/ML IJ SOLN: 0.2500 mg | INTRAMUSCULAR | Status: DC | PRN

## 2017-11-24 MED ORDER — GABAPENTIN 300 MG PO CAPS
300.0000 mg | ORAL_CAPSULE | Freq: Every day | ORAL | Status: DC
  Administered 2017-11-24: 300 mg via ORAL
  Filled 2017-11-24: qty 1

## 2017-11-24 MED ORDER — ONDANSETRON HCL 4 MG/2ML IJ SOLN: 4.0000 mg | Freq: Four times a day (QID) | INTRAMUSCULAR | Status: DC | PRN

## 2017-11-24 MED ORDER — FENTANYL CITRATE (PF) 100 MCG/2ML IJ SOLN
INTRAMUSCULAR | Status: DC | PRN
  Administered 2017-11-24 (×3): 50 ug via INTRAVENOUS

## 2017-11-24 MED ORDER — DILTIAZEM HCL ER COATED BEADS 180 MG PO CP24
180.0000 mg | ORAL_CAPSULE | Freq: Every day | ORAL | Status: DC
  Administered 2017-11-24 – 2017-11-25 (×2): 180 mg via ORAL
  Filled 2017-11-24 (×2): qty 1

## 2017-11-24 MED ORDER — MIDAZOLAM HCL 5 MG/5ML IJ SOLN
INTRAMUSCULAR | Status: DC | PRN
  Administered 2017-11-24 (×2): 1 mg via INTRAVENOUS

## 2017-11-24 MED ORDER — CEFAZOLIN SODIUM-DEXTROSE 2-4 GM/100ML-% IV SOLN
2.0000 g | INTRAVENOUS | Status: AC
  Administered 2017-11-24: 2 g via INTRAVENOUS
  Filled 2017-11-24: qty 100

## 2017-11-24 MED ORDER — METOPROLOL TARTRATE 5 MG/5ML IV SOLN: 2.0000 mg | INTRAVENOUS | Status: DC | PRN

## 2017-11-24 MED ORDER — SODIUM CHLORIDE 0.9 % IV SOLN
INTRAVENOUS | Status: DC
  Administered 2017-11-24: 08:00:00 via INTRAVENOUS

## 2017-11-24 MED ORDER — SODIUM CHLORIDE 0.9 % IV SOLN
INTRAVENOUS | Status: DC | PRN
  Administered 2017-11-24: 11:00:00

## 2017-11-24 MED ORDER — PANTOPRAZOLE SODIUM 40 MG PO TBEC
40.0000 mg | DELAYED_RELEASE_TABLET | Freq: Every day | ORAL | Status: DC
  Administered 2017-11-24 – 2017-11-25 (×2): 40 mg via ORAL
  Filled 2017-11-24 (×2): qty 1

## 2017-11-24 MED ORDER — CARVEDILOL 25 MG PO TABS
25.0000 mg | ORAL_TABLET | Freq: Two times a day (BID) | ORAL | Status: DC
  Administered 2017-11-25: 25 mg via ORAL
  Filled 2017-11-24: qty 1

## 2017-11-24 MED ORDER — CLOPIDOGREL BISULFATE 75 MG PO TABS
75.0000 mg | ORAL_TABLET | Freq: Every day | ORAL | Status: DC
  Administered 2017-11-24 – 2017-11-25 (×2): 75 mg via ORAL
  Filled 2017-11-24 (×2): qty 1

## 2017-11-24 SURGICAL SUPPLY — 30 items
ARMBAND PINK RESTRICT EXTREMIT (MISCELLANEOUS) ×4 IMPLANT
CANISTER SUCT 3000ML PPV (MISCELLANEOUS) ×2 IMPLANT
CANNULA VESSEL 3MM 2 BLNT TIP (CANNULA) ×2 IMPLANT
CLIP LIGATING EXTRA MED SLVR (CLIP) ×2 IMPLANT
CLIP LIGATING EXTRA SM BLUE (MISCELLANEOUS) ×2 IMPLANT
DECANTER SPIKE VIAL GLASS SM (MISCELLANEOUS) ×2 IMPLANT
DERMABOND ADVANCED (GAUZE/BANDAGES/DRESSINGS) ×1
DERMABOND ADVANCED .7 DNX12 (GAUZE/BANDAGES/DRESSINGS) ×1 IMPLANT
ELECT REM PT RETURN 9FT ADLT (ELECTROSURGICAL) ×2
ELECTRODE REM PT RTRN 9FT ADLT (ELECTROSURGICAL) ×1 IMPLANT
GAUZE SPONGE 4X4 12PLY STRL (GAUZE/BANDAGES/DRESSINGS) ×2 IMPLANT
GLOVE BIO SURGEON STRL SZ 6.5 (GLOVE) ×6 IMPLANT
GLOVE SS BIOGEL STRL SZ 7.5 (GLOVE) ×1 IMPLANT
GLOVE SUPERSENSE BIOGEL SZ 7.5 (GLOVE) ×1
GOWN STRL REUS W/ TWL LRG LVL3 (GOWN DISPOSABLE) ×4 IMPLANT
GOWN STRL REUS W/TWL LRG LVL3 (GOWN DISPOSABLE) ×4
GRAFT GORETEX STRT 4-7X45 (Vascular Products) ×2 IMPLANT
KIT BASIN OR (CUSTOM PROCEDURE TRAY) ×2 IMPLANT
KIT TURNOVER KIT B (KITS) ×2 IMPLANT
NS IRRIG 1000ML POUR BTL (IV SOLUTION) ×2 IMPLANT
PACK CV ACCESS (CUSTOM PROCEDURE TRAY) ×2 IMPLANT
PAD ARMBOARD 7.5X6 YLW CONV (MISCELLANEOUS) ×4 IMPLANT
SUT PROLENE 6 0 CC (SUTURE) ×4 IMPLANT
SUT SILK 2 0 PERMA HAND 18 BK (SUTURE) IMPLANT
SUT VIC AB 3-0 SH 27 (SUTURE) ×2
SUT VIC AB 3-0 SH 27X BRD (SUTURE) ×2 IMPLANT
SYR TOOMEY 50ML (SYRINGE) IMPLANT
TOWEL GREEN STERILE (TOWEL DISPOSABLE) ×2 IMPLANT
UNDERPAD 30X30 (UNDERPADS AND DIAPERS) ×2 IMPLANT
WATER STERILE IRR 1000ML POUR (IV SOLUTION) ×2 IMPLANT

## 2017-11-24 NOTE — Anesthesia Procedure Notes (Signed)
Procedure Name: MAC Date/Time: 11/24/2017 9:42 AM Performed by: Scheryl Darter, CRNA Pre-anesthesia Checklist: Patient identified, Emergency Drugs available, Suction available and Patient being monitored Patient Re-evaluated:Patient Re-evaluated prior to induction Oxygen Delivery Method: Simple face mask Placement Confirmation: positive ETCO2

## 2017-11-24 NOTE — Progress Notes (Signed)
Pt. States that he does not have anyone to stay with him tonight. Per Dr. Donnetta Hutching he will be admitted overnight.

## 2017-11-24 NOTE — Discharge Instructions (Signed)
° °  Vascular and Vein Specialists of [place] ° °Discharge Instructions ° °AV Fistula or Graft Surgery for Dialysis Access ° °Please refer to the following instructions for your post-procedure care. Your surgeon or physician assistant will discuss any changes with you. ° °Activity ° °You may drive the day following your surgery, if you are comfortable and no longer taking prescription pain medication. Resume full activity as the soreness in your incision resolves. ° °Bathing/Showering ° °You may shower after you go home. Keep your incision dry for 48 hours. Do not soak in a bathtub, hot tub, or swim until the incision heals completely. You may not shower if you have a hemodialysis catheter. ° °Incision Care ° °Clean your incision with mild soap and water after 48 hours. Pat the area dry with a clean towel. You do not need a bandage unless otherwise instructed. Do not apply any ointments or creams to your incision. You may have skin glue on your incision. Do not peel it off. It will come off on its own in about one week. Your arm may swell a bit after surgery. To reduce swelling use pillows to elevate your arm so it is above your heart. Your doctor will tell you if you need to lightly wrap your arm with an ACE bandage. ° °Diet ° °Resume your normal diet. There are not special food restrictions following this procedure. In order to heal from your surgery, it is CRITICAL to get adequate nutrition. Your body requires vitamins, minerals, and protein. Vegetables are the best source of vitamins and minerals. Vegetables also provide the perfect balance of protein. Processed food has little nutritional value, so try to avoid this. ° °Medications ° °Resume taking all of your medications. If your incision is causing pain, you may take over-the counter pain relievers such as acetaminophen (Tylenol). If you were prescribed a stronger pain medication, please be aware these medications can cause nausea and constipation. Prevent  nausea by taking the medication with a snack or meal. Avoid constipation by drinking plenty of fluids and eating foods with high amount of fiber, such as fruits, vegetables, and grains. Do not take Tylenol if you are taking prescription pain medications. ° ° ° ° °Follow up °Your surgeon may want to see you in the office following your access surgery. If so, this will be arranged at the time of your surgery. ° °Please call us immediately for any of the following conditions: ° °Increased pain, redness, drainage (pus) from your incision site °Fever of 101 degrees or higher °Severe or worsening pain at your incision site °Hand pain or numbness. ° °Reduce your risk of vascular disease: ° °Stop smoking. If you would like help, call [facility] at 1-800-QUIT-NOW (1-800-784-8669) or [name] at 336-586-4000 ° °Manage your cholesterol °Maintain a desired weight °Control your diabetes °Keep your blood pressure down ° °Dialysis ° °It will take several weeks to several months for your new dialysis access to be ready for use. Your surgeon will determine when it is OK to use it. Your nephrologist will continue to direct your dialysis. You can continue to use your Permcath until your new access is ready for use. ° °If you have any questions, please call the office at [phone]. ° °

## 2017-11-24 NOTE — Transfer of Care (Signed)
Immediate Anesthesia Transfer of Care Note  Patient: [name]  Procedure(s) Performed: INSERTION OF ARTERIOVENOUS (AV) GORE-TEX GRAFT LEFT LOWER ARM (Left Arm Lower)  Patient Location: PACU  Anesthesia Type:MAC  Level of Consciousness: awake, alert , oriented and sedated  Airway & Oxygen Therapy: Patient Spontanous Breathing  Post-op Assessment: Report given to RN, Post -op Vital signs reviewed and stable and Patient moving all extremities  Post vital signs: Reviewed and stable  Last Vitals:  Vitals Value Taken Time  BP 148/81 11/24/2017 11:07 AM  Temp    Pulse 72 11/24/2017 11:07 AM  Resp 21 11/24/2017 11:07 AM  SpO2 95 % 11/24/2017 11:07 AM  Vitals shown include unvalidated device data.  Last Pain:  Vitals:   11/24/17 0745  TempSrc:   PainSc: 0-No pain      Patients Stated Pain Goal: 2 (59/74/16 3845)  Complications: No apparent anesthesia complications

## 2017-11-24 NOTE — Anesthesia Postprocedure Evaluation (Signed)
Anesthesia Post Note  Patient: Charles Hart  Procedure(s) Performed: INSERTION OF ARTERIOVENOUS (AV) GORE-TEX GRAFT LEFT LOWER ARM (Left Arm Lower)     Patient location during evaluation: PACU Anesthesia Type: MAC Level of consciousness: awake and alert Pain management: pain level controlled Vital Signs Assessment: post-procedure vital signs reviewed and stable Respiratory status: spontaneous breathing, nonlabored ventilation, respiratory function stable and patient connected to nasal cannula oxygen Cardiovascular status: stable and blood pressure returned to baseline Postop Assessment: no apparent nausea or vomiting Anesthetic complications: no    Last Vitals:  Vitals:   11/24/17 1140 11/24/17 1155  BP: (!) 156/78 (!) 145/83  Pulse: 66 65  Resp: (!) 24 17  Temp:    SpO2: 94% 94%    Last Pain:  Vitals:   11/24/17 1155  TempSrc:   PainSc: 0-No pain                 Icarus Partch,JAMES TERRILL

## 2017-11-24 NOTE — Interval H&P Note (Signed)
History and Physical Interval Note:  11/24/2017 7:19 AM  Charles Hart  has presented today for surgery, with the diagnosis of END STAGE RENAL DISEASE STAGE IV FOR HEMODIALYSIS ACCESS  The various methods of treatment have been discussed with the patient and family. After consideration of risks, benefits and other options for treatment, the patient has consented to  Procedure(s): INSERTION OF ARTERIOVENOUS (AV) GORE-TEX GRAFT ARM LEFT ARM (Left) as a surgical intervention .  The patient's history has been reviewed, patient examined, no change in status, stable for surgery.  I have reviewed the patient's chart and labs.  Questions were answered to the patient's satisfaction.     Curt Jews

## 2017-11-24 NOTE — Anesthesia Preprocedure Evaluation (Signed)
Anesthesia Evaluation  Patient identified by MRN, date of birth, ID band Patient awake    Reviewed: Allergy & Precautions, NPO status , Patient's Chart, lab work & pertinent test results  Airway Mallampati: II   Neck ROM: Full    Dental  (+) Edentulous Upper, Edentulous Lower   Pulmonary shortness of breath, sleep apnea ,    breath sounds clear to auscultation       Cardiovascular hypertension, + CAD, + Past MI and +CHF   Rhythm:Regular Rate:Normal     Neuro/Psych  Neuromuscular disease CVA    GI/Hepatic   Endo/Other  diabetes, Poorly Controlled  Renal/GU CRF and ESRFRenal disease     Musculoskeletal  (+) Arthritis ,   Abdominal   Peds  Hematology  (+) anemia ,   Anesthesia Other Findings   Reproductive/Obstetrics                             Anesthesia Physical Anesthesia Plan  ASA: III  Anesthesia Plan: MAC   Post-op Pain Management:    Induction: Intravenous  PONV Risk Score and Plan: 2 and Treatment may vary due to age or medical condition, Ondansetron and Dexamethasone  Airway Management Planned: Natural Airway and Simple Face Mask  Additional Equipment:   Intra-op Plan:   Post-operative Plan:   Informed Consent: I have reviewed the patients History and Physical, chart, labs and discussed the procedure including the risks, benefits and alternatives for the proposed anesthesia with the patient or authorized representative who has indicated his/her understanding and acceptance.   Dental advisory given  Plan Discussed with: CRNA  Anesthesia Plan Comments:         Anesthesia Quick Evaluation

## 2017-11-24 NOTE — Op Note (Signed)
    OPERATIVE REPORT  DATE OF SURGERY: 11/24/2017  PATIENT: Charles Hart, 62 y.o. male MRN: [id number]  DOB: Feb 02, 1956  PRE-OPERATIVE DIAGNOSIS: Chronic renal insufficiency  POST-OPERATIVE DIAGNOSIS:  Same  PROCEDURE: Left forearm loop AV Gore-Tex graft  SURGEON:  Curt Jews, M.D.  PHYSICIAN ASSISTANT: Matt Eveland PA-C  ANESTHESIA: Local with sedation  EBL: Minimal ml  Total I/O In: 400 [I.V.:400] Out: -   BLOOD ADMINISTERED: None  DRAINS: None  SPECIMEN: None  COUNTS CORRECT:  YES  PLAN OF CARE: PACU  PATIENT DISPOSITION:  PACU - hemodynamically stable  PROCEDURE DETAILS: The patient was taken to the operating placed supine position where the area of the left arm was prepped and draped in usual sterile fashion.  SonoSite ultrasound was used to reimage the veins.  The patient had extremely small cephalic and basilic veins.  Incision was made longitudinally at the antecubital space over the brachial artery.  The patient did have a moderate to large brachial vein alongside the brachial artery.  For this reason decision was made to place a forearm loop graft.  Separate incision was made over the distal forearm in a loop configuration tunnel was created.  A 7-4 taper Gore-Tex graft was brought through the tunnel.  The brachial artery was occluded proximally and distally and was opened with an 11 blade and sent along Chinle with Potts scissors.  A small arteriotomy was created.  The graft was spatulated at approximately 5 mm position and was sewn end-to-side to the artery with a running 6-0 Prolene suture.  The graft was occluded and clamps were removed from the artery and good anastomosis was encountered.  The graft was flushed with heparinized saline and reoccluded.  Next the brachial vein was occluded proximally distally and was opened with 11 blade and sent longitudinally.  The graft was cut to the appropriate length and was spatulated and sewn end-to-side to the brachial  vein with a running 6-0 Prolene suture.  Clamps were removed and excellent thrill was noted.  The patient continued to have a 2+ radial pulse.  The wounds were irrigated with saline.  Hemostasis obtained with cautery.  Wounds were closed with 3-0 Vicryl in the subcutaneous and subcuticular tissue.  Sterile dressing was applied and the patient was transferred to the recovery room in stable condition   Rosetta Posner, M.D., [facility] 11/24/2017 10:56 AM

## 2017-11-25 DIAGNOSIS — E1122 Type 2 diabetes mellitus with diabetic chronic kidney disease: Secondary | ICD-10-CM | POA: Diagnosis not present

## 2017-11-25 LAB — COMPREHENSIVE METABOLIC PANEL
ALK PHOS: 53 U/L (ref 38–126)
ALT: 9 U/L — AB (ref 17–63)
ANION GAP: 15 (ref 5–15)
AST: 16 U/L (ref 15–41)
Albumin: 3.4 g/dL — ABNORMAL LOW (ref 3.5–5.0)
BILIRUBIN TOTAL: 0.6 mg/dL (ref 0.3–1.2)
BUN: 71 mg/dL — ABNORMAL HIGH (ref 6–20)
CALCIUM: 7.2 mg/dL — AB (ref 8.9–10.3)
CO2: 21 mmol/L — ABNORMAL LOW (ref 22–32)
CREATININE: 5.71 mg/dL — AB (ref 0.61–1.24)
Chloride: 99 mmol/L — ABNORMAL LOW (ref 101–111)
GFR calc non Af Amer: 10 mL/min — ABNORMAL LOW (ref >60)
GFR, EST AFRICAN AMERICAN: 11 mL/min — AB (ref >60)
Glucose, Bld: 181 mg/dL — ABNORMAL HIGH (ref 65–99)
Potassium: 4.1 mmol/L (ref 3.5–5.1)
Sodium: 135 mmol/L (ref 135–145)
TOTAL PROTEIN: 6.7 g/dL (ref 6.5–8.1)

## 2017-11-25 LAB — CBC
HEMATOCRIT: 29.5 % — AB (ref 39.0–52.0)
HEMOGLOBIN: 9.4 g/dL — AB (ref 13.0–17.0)
MCH: 28.1 pg (ref 26.0–34.0)
MCHC: 31.9 g/dL (ref 30.0–36.0)
MCV: 88.3 fL (ref 78.0–100.0)
Platelets: 236 10*3/uL (ref 150–400)
RBC: 3.34 MIL/uL — AB (ref 4.22–5.81)
RDW: 12.9 % (ref 11.5–15.5)
WBC: 9.3 10*3/uL (ref 4.0–10.5)

## 2017-11-25 LAB — GLUCOSE, CAPILLARY: GLUCOSE-CAPILLARY: 121 mg/dL — AB (ref 65–99)

## 2017-11-25 NOTE — Discharge Summary (Signed)
Physician Discharge Summary       Patient ID: Charles Hart MRN: [id number] DOB/AGE: 09-09-1955 62 y.o.  Admit date: 11/24/2017 Discharge date: 11/25/2017  Admission Diagnosis: Chronic kidney disease  Discharge Diagnoses:  Same  Secondary Diagnoses: Active Problems:   CKD (chronic kidney disease)   S/P arteriovenous (AV) fistula creation   Procedures: Left forearm AV loop graft  Discharged Condition: good  Hospital Course: Patient was admitted following left forearm AV loop graft for overnight observation.  The next day he was noted to be feeling well without any evidence of steal and had a palpable thrill in his graft.  He was at that time thought to be stable for discharge.    Significant Diagnostic Studies: CBC CBC Latest Ref Rng & Units 11/25/2017 11/24/2017 11/09/2017  WBC 4.0 - 10.5 K/uL 9.3 - 5.9  Hemoglobin 13.0 - 17.0 g/dL 9.4(L) 9.5(L) 9.9(L)  Hematocrit 39.0 - 52.0 % 29.5(L) 28.0(L) 30.4(L)  Platelets 150 - 400 K/uL 236 - 244     In the ICU.  COAG Lab Results  Component Value Date   INR 1.14 11/09/2017   INR 1.04 07/10/2013   INR 1.1 RATIO 09/15/2006   No results found for: PTT  Disposition: Discharge disposition: 01-Home or Self Care       Discharge Instructions    Call MD for:  redness, tenderness, or signs of infection (pain, swelling, bleeding, redness, odor or green/yellow discharge around incision site)   Complete by:  As directed    Call MD for:  severe or increased pain, loss or decreased feeling  in affected limb(s)   Complete by:  As directed    Call MD for:  temperature >100.5   Complete by:  As directed    Resume previous diet   Complete by:  As directed      Allergies as of 11/25/2017      Reactions   Ivp Dye [iodinated Diagnostic Agents] Other (See Comments)   Per patient's Nephrologist, he doesn't want the patient exposed to ANY dye because of issues with his kidneys   Amlodipine Swelling   Lisinopril Cough   Red  Dye Other (See Comments)   NO dye of any kind (issues with his kidneys)      Medication List    TAKE these medications   calcitRIOL 0.25 MCG capsule Commonly known as:  ROCALTROL Take 0.25 mcg by mouth every Monday, Wednesday, and Friday.   CARTIA XT 180 MG 24 hr capsule Generic drug:  diltiazem TAKE 1 CAPSULE(180 MG) BY MOUTH DAILY   carvedilol 25 MG tablet Commonly known as:  COREG Take 25 mg by mouth 2 (two) times daily with a meal.   clopidogrel 75 MG tablet Commonly known as:  PLAVIX Take 1 tablet (75 mg total) by mouth daily.   fluticasone 50 MCG/ACT nasal spray Commonly known as:  FLONASE Place 2 sprays daily into both nostrils.   furosemide 80 MG tablet Commonly known as:  LASIX Take 1 tablet (80 mg total) by mouth 2 (two) times daily.   gabapentin 300 MG capsule Commonly known as:  NEURONTIN Take 300 mg by mouth at bedtime What changed:    how much to take  how to take this  when to take this  additional instructions   hydrALAZINE 100 MG tablet Commonly known as:  APRESOLINE Take 1 tablet (100 mg total) by mouth 3 (three) times daily.   HYDROcodone-acetaminophen 5-325 MG tablet Commonly known as:  NORCO Take 1 tablet by  mouth every 6 (six) hours as needed for moderate pain.   Insulin Pen Needle 32G X 4 MM Misc Commonly known as:  NOVOFINE PLUS 1 Dose by Does not apply route daily. For Victoza, substitution ok for cheaper brand   liraglutide 18 MG/3ML Sopn Commonly known as:  VICTOZA Inject 0.1 mLs (0.6 mg total) into the skin daily.   pravastatin 40 MG tablet Commonly known as:  PRAVACHOL Take 1 tablet (40 mg total) by mouth daily.   promethazine 25 MG tablet Commonly known as:  PHENERGAN Take 1 tablet (25 mg total) by mouth every 6 (six) hours as needed for nausea or vomiting.      Follow-up Information    Early, Arvilla Meres, MD Follow up.   Specialties:  Vascular Surgery, Cardiology Why:  follow up as needed Contact information: [address] [place] [address] [phone]           Signed:  Eda Paschal. Donzetta Matters, MD [facility] Office: [phone] Pager: [phone] Patient was seen.  After patient pain medications noted  11/25/2017, 11:14 AM

## 2017-11-27 ENCOUNTER — Encounter (HOSPITAL_COMMUNITY): Payer: Self-pay | Admitting: Vascular Surgery

## 2017-12-05 ENCOUNTER — Encounter: Payer: Self-pay | Admitting: *Deleted

## 2017-12-06 NOTE — Progress Notes (Signed)
  Subjective:  HPI: [name] is a 62 y.o. male who presents for f/u DM  Please see Assessment and Plan below for the status of his chronic medical problems.  Review of Systems: Review of Systems  Cardiovascular: Negative for chest pain and leg swelling.  Skin: Positive for itching (at graft site). Negative for rash.  Neurological: Negative for dizziness.    Objective:  Physical Exam: Vitals:   12/07/17 0901 12/07/17 1008  BP: (!) 148/65 (!) 147/67  Pulse: 66 63  Temp: 98.3 F (36.8 C)   TempSrc: Oral   SpO2: 99%   Weight: 223 lb 1.6 oz (101.2 kg)   Height: 6' (1.829 m)    Physical Exam  Constitutional: No distress.  HENT:  Head: Normocephalic and atraumatic.  Cardiovascular: Normal rate and regular rhythm.  Pulmonary/Chest: Effort normal and breath sounds normal.  Abdominal: Soft.  Musculoskeletal: He exhibits no edema.  + bruit over left forarm  Nursing note and vitals reviewed.  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  A: Controlled Type 2 DM with CKD stage 5 P: Continue Victoza 0.6mg  daily  Aortic atherosclerosis (HCC) -Noted on recent CT. 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   Medications Ordered Meds ordered this encounter  Medications  . promethazine (PHENERGAN) 25 MG tablet    Sig: Take 1 tablet (25 mg total) by mouth every 6 (six) hours as needed for nausea or vomiting.    Dispense:  30 tablet    Refill:  0   Other Orders Orders Placed This Encounter  Procedures  . Glucose, capillary  . Microalbumin / Creatinine Urine Ratio  .  Ambulatory referral to Gastroenterology    Referral Priority:   Routine    Referral Type:   Consultation    Referral Reason:   Specialty Services Required    Number of Visits Requested:   1  . Ambulatory referral to Podiatry    Referral Priority:   Routine    Referral Type:   Consultation    Referral Reason:   Specialty Services Required    Requested Specialty:   Podiatry    Number of Visits Requested:   1  . POC Hbg A1C   Follow Up: Return in about 3 months (around 03/08/2018).

## 2017-12-07 ENCOUNTER — Ambulatory Visit: Payer: PPO | Admitting: Pharmacist

## 2017-12-07 ENCOUNTER — Encounter: Payer: Self-pay | Admitting: Internal Medicine

## 2017-12-07 ENCOUNTER — Encounter: Payer: Self-pay | Admitting: Gastroenterology

## 2017-12-07 ENCOUNTER — Ambulatory Visit (INDEPENDENT_AMBULATORY_CARE_PROVIDER_SITE_OTHER): Payer: PPO | Admitting: Internal Medicine

## 2017-12-07 ENCOUNTER — Telehealth: Payer: Self-pay | Admitting: Gastroenterology

## 2017-12-07 ENCOUNTER — Other Ambulatory Visit: Payer: Self-pay

## 2017-12-07 VITALS — BP 147/67 | HR 63 | Temp 98.3°F | Ht 72.0 in | Wt 223.1 lb

## 2017-12-07 DIAGNOSIS — I700 Atherosclerosis of aorta: Secondary | ICD-10-CM | POA: Insufficient documentation

## 2017-12-07 DIAGNOSIS — N185 Chronic kidney disease, stage 5: Secondary | ICD-10-CM

## 2017-12-07 DIAGNOSIS — E11311 Type 2 diabetes mellitus with unspecified diabetic retinopathy with macular edema: Secondary | ICD-10-CM

## 2017-12-07 DIAGNOSIS — Z794 Long term (current) use of insulin: Secondary | ICD-10-CM

## 2017-12-07 DIAGNOSIS — Z0000 Encounter for general adult medical examination without abnormal findings: Secondary | ICD-10-CM

## 2017-12-07 DIAGNOSIS — E113593 Type 2 diabetes mellitus with proliferative diabetic retinopathy without macular edema, bilateral: Secondary | ICD-10-CM

## 2017-12-07 DIAGNOSIS — N184 Chronic kidney disease, stage 4 (severe): Secondary | ICD-10-CM | POA: Diagnosis not present

## 2017-12-07 DIAGNOSIS — E1122 Type 2 diabetes mellitus with diabetic chronic kidney disease: Secondary | ICD-10-CM | POA: Diagnosis not present

## 2017-12-07 DIAGNOSIS — L299 Pruritus, unspecified: Secondary | ICD-10-CM

## 2017-12-07 DIAGNOSIS — Z79899 Other long term (current) drug therapy: Secondary | ICD-10-CM | POA: Diagnosis not present

## 2017-12-07 DIAGNOSIS — L602 Onychogryphosis: Secondary | ICD-10-CM

## 2017-12-07 DIAGNOSIS — N2889 Other specified disorders of kidney and ureter: Secondary | ICD-10-CM

## 2017-12-07 LAB — GLUCOSE, CAPILLARY: GLUCOSE-CAPILLARY: 131 mg/dL — AB (ref 65–99)

## 2017-12-07 LAB — POCT GLYCOSYLATED HEMOGLOBIN (HGB A1C): Hemoglobin A1C: 5.4

## 2017-12-07 MED ORDER — PROMETHAZINE HCL 25 MG PO TABS: 25.0000 mg | ORAL_TABLET | Freq: Four times a day (QID) | ORAL | 0 refills | Status: DC | PRN

## 2017-12-07 MED ORDER — LIRAGLUTIDE 18 MG/3ML ~~LOC~~ SOPN: 0.6000 mg | PEN_INJECTOR | Freq: Every day | SUBCUTANEOUS | 3 refills | Status: DC

## 2017-12-07 NOTE — Assessment & Plan Note (Signed)
Recently saw Dr Katy Fitch, obtained and reviewed note, he needs to follow up with Dr Zadie Rhine for his diabetic retinopathy

## 2017-12-07 NOTE — Assessment & Plan Note (Addendum)
HPI: no complaints, using victoza.  A: Controlled Type 2 DM with CKD stage 5 P: Continue Victoza 0.6mg  daily

## 2017-12-07 NOTE — Progress Notes (Signed)
Patient was seen in clinic with Wynell Balloon, PharmD, PGY1 pharmacy resident. I agree with the assessment and plan of care documented. Patient had insulin discontinued in August 2018 and is now stable on Victoza 0.6 mg daily (will re-send a 90-day prescription for cost savings per pharmacy, $3 copay for 90 days supply). Patient reports no questions/concerns today.

## 2017-12-07 NOTE — Progress Notes (Signed)
S: Charles Hart is a 62 y.o. male reports to clinical pharmacist appointment for follow up on medication assistance. Patient did not bring medication bottles.   A/P: [name] was successful in getting medicare extra help which is making his medications more affordable. He endorses compliance and denies any trouble with his medicine. Denies any hypoglycemic events.  Hgb A1c was 5.4 during today's visit. Will not change the dose of his Victoza at this time. Provided a sample of the medication and confirmed with his pharmacy that they have the Rx and is affordable (~$3).   [name] PharmD PGY1 Pharmacy Practice Resident 12/07/2017 10:17 AM Pager: [phone]    15 minutes spent face-to-face with the patient during the encounter. 25% of time spent on education. 75% of time was spent on contacting his pharmacy, discussing his medication regimen, and documentation.

## 2017-12-07 NOTE — Telephone Encounter (Signed)
The pt is due now.  He is on plavix will need appt with app or Dr Ardis Hughs.  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  These are pre-cancerous polyps that may have grown into cancers if they had not been removed.  Based on current nationally recognized surveillance guidelines, I recommend that you have a repeat colonoscopy in 5 years.   We will therefore put your information in our reminder system and will contact you in 5 years to schedule a repeat procedure.   If you have any questions or concerns, please don't hesitate to call.  Sincerely,    Owens Loffler, MD

## 2017-12-07 NOTE — Assessment & Plan Note (Signed)
-  Noted on recent CT. -Cotninue pravastatin 40mg  daily

## 2017-12-08 ENCOUNTER — Encounter: Payer: Self-pay | Admitting: *Deleted

## 2017-12-08 LAB — MICROALBUMIN / CREATININE URINE RATIO
CREATININE, UR: 28.8 mg/dL
MICROALBUM., U, RANDOM: 455.1 ug/mL
Microalb/Creat Ratio: 1580.2 mg/g creat — ABNORMAL HIGH (ref 0.0–30.0)

## 2017-12-08 NOTE — Assessment & Plan Note (Signed)
Reviewed Nephrology note from 11/13/17, they note calling Urology to ascertain status of possible biopsy of left renal mass.

## 2017-12-11 ENCOUNTER — Telehealth: Payer: Self-pay

## 2017-12-11 NOTE — Telephone Encounter (Signed)
Spoke w/ pharmacist, reiterated present script for victoza

## 2017-12-11 NOTE — Telephone Encounter (Signed)
Charles Hart with [facility] needs to speak with a nurse about liraglutide (Solvay) 18 MG/3ML SOPN.

## 2017-12-17 ENCOUNTER — Encounter (HOSPITAL_COMMUNITY): Payer: Self-pay | Admitting: Emergency Medicine

## 2017-12-17 ENCOUNTER — Emergency Department (HOSPITAL_COMMUNITY)
Admission: EM | Admit: 2017-12-17 | Discharge: 2017-12-17 | Disposition: A | Discharge status: Home / Self Care | Payer: 59 | Attending: Emergency Medicine | Admitting: Emergency Medicine

## 2017-12-17 ENCOUNTER — Other Ambulatory Visit: Payer: Self-pay

## 2017-12-17 ENCOUNTER — Emergency Department (HOSPITAL_COMMUNITY): Payer: 59

## 2017-12-17 DIAGNOSIS — Z7902 Long term (current) use of antithrombotics/antiplatelets: Secondary | ICD-10-CM | POA: Insufficient documentation

## 2017-12-17 DIAGNOSIS — E1140 Type 2 diabetes mellitus with diabetic neuropathy, unspecified: Secondary | ICD-10-CM | POA: Insufficient documentation

## 2017-12-17 DIAGNOSIS — E785 Hyperlipidemia, unspecified: Secondary | ICD-10-CM | POA: Insufficient documentation

## 2017-12-17 DIAGNOSIS — S5012XA Contusion of left forearm, initial encounter: Secondary | ICD-10-CM | POA: Insufficient documentation

## 2017-12-17 DIAGNOSIS — N184 Chronic kidney disease, stage 4 (severe): Secondary | ICD-10-CM | POA: Diagnosis not present

## 2017-12-17 DIAGNOSIS — I130 Hypertensive heart and chronic kidney disease with heart failure and stage 1 through stage 4 chronic kidney disease, or unspecified chronic kidney disease: Secondary | ICD-10-CM | POA: Diagnosis not present

## 2017-12-17 DIAGNOSIS — Z79899 Other long term (current) drug therapy: Secondary | ICD-10-CM | POA: Insufficient documentation

## 2017-12-17 DIAGNOSIS — I429 Cardiomyopathy, unspecified: Secondary | ICD-10-CM | POA: Diagnosis not present

## 2017-12-17 DIAGNOSIS — Y929 Unspecified place or not applicable: Secondary | ICD-10-CM | POA: Diagnosis not present

## 2017-12-17 DIAGNOSIS — I5042 Chronic combined systolic (congestive) and diastolic (congestive) heart failure: Secondary | ICD-10-CM | POA: Insufficient documentation

## 2017-12-17 DIAGNOSIS — I252 Old myocardial infarction: Secondary | ICD-10-CM | POA: Insufficient documentation

## 2017-12-17 DIAGNOSIS — Z8673 Personal history of transient ischemic attack (TIA), and cerebral infarction without residual deficits: Secondary | ICD-10-CM | POA: Diagnosis not present

## 2017-12-17 DIAGNOSIS — W19XXXA Unspecified fall, initial encounter: Secondary | ICD-10-CM | POA: Diagnosis not present

## 2017-12-17 DIAGNOSIS — Y939 Activity, unspecified: Secondary | ICD-10-CM | POA: Insufficient documentation

## 2017-12-17 DIAGNOSIS — Z794 Long term (current) use of insulin: Secondary | ICD-10-CM | POA: Insufficient documentation

## 2017-12-17 DIAGNOSIS — Y998 Other external cause status: Secondary | ICD-10-CM | POA: Diagnosis not present

## 2017-12-17 DIAGNOSIS — S59912A Unspecified injury of left forearm, initial encounter: Secondary | ICD-10-CM | POA: Diagnosis present

## 2017-12-17 NOTE — ED Triage Notes (Signed)
Patient c/o left arm pain. Per patient fell out of bed while sleeping and landed on left arm. Patient receives dialysis, fistula in left arm-thrill felt. Patient states "feels like pins in arm," Full ROM notices. Patient also reports hitting head on chair, waking him immediately. Denies any nausea, vomiting, headache, or blurred vision. Patient does report taking blood thinner, Plavix.

## 2017-12-17 NOTE — ED Notes (Signed)
Pt returned from xray

## 2017-12-17 NOTE — ED Notes (Signed)
Pt reports falling out of bed. States he hit left side of head and arm. Pt is concerned for left arm due to maturing AV graft.

## 2017-12-17 NOTE — Discharge Instructions (Addendum)
Take Tylenol for pain and follow-up with your doctor if any problems °

## 2017-12-17 NOTE — ED Provider Notes (Signed)
[facility] EMERGENCY DEPARTMENT Provider Note   CSN: [id number] Arrival date & time: 12/17/17  1712     History   Chief Complaint Chief Complaint  Patient presents with  . Fall    HPI Charles Hart is a 62 y.o. male.  Patient fell onto his left arm.  The history is provided by the patient. No language interpreter was used.  Fall  This is a new problem. The current episode started 12 to 24 hours ago. The problem occurs rarely. The problem has been resolved. Pertinent negatives include no chest pain, no abdominal pain and no headaches. Nothing aggravates the symptoms.    Past Medical History:  Diagnosis Date  . Adenomatous colon polyp 07/02/2011   Last colonoscopy May 06, 2011 by Dr. Owens Loffler, who recommended repeat colonoscopy in 5 years.   . Anemia   . Background diabetic retinopathy 04/20/2012   Patient is followed by Dr. Katy Fitch   . Cardiomyopathy    LV function improved from 2004 to 2008.  Historically, moderately dilated LV with EF 30-40% by 2D echo 08/14/2002.  Mild CAD with severe LV dysfunction by cardiac cath 09/2002.  Normal coronary arteries and normal LV function by cardiac cath 09/19/2006.  A 2-D echo on 04/01/2009 showed mild concentric hypertrophy and normal systolic (LVEF  62-13%) and doppler C/W with grade 1 diastolic dysfunction.  . CHF (congestive heart failure) ([place])    LV function improved from 2004 to 2008.  Historically, moderately dilated LV with EF 30-40% by 2D echo 08/14/2002.  Mild CAD with severe LV dysfunction by cardiac cath 09/2002.  Normal coronary arteries and normal LV function by cardiac cath 09/19/2006.  A 2-D echo on 04/01/2009 showed mild concentric hypertrophy and normal systolic (LVEF  08-65%) and doppler C/W with grade 1 diastolic dysfunction..   . Chronic combined systolic and diastolic congestive heart failure ([place]) 05/21/2010   LV function improved from 2004 to 2008.  Historically, moderately dilated LV with EF 30-40% by 2D echo  08/14/2002.  Mild CAD with severe LV dysfunction by cardiac cath 09/2002.  Normal coronary arteries and normal LV function by cardiac cath 09/19/2006.  A 2-D echo on 04/01/2009 showed mild concentric hypertrophy and normal systolic (LVEF  78-46%) and doppler parameters consistent with abnormal left   . CVA (cerebral vascular accident) ([place]) 07/04/2012   MRI of the brain 07/04/2012 showed an acute infarct in the right basal ganglia involving the anterior putamen, anterior limb internal capsule, and head of the caudate; this measured approximately 2.5 cm in diameter.     . Dermatitis   . Diabetes mellitus    type 2  . DIABETIC PERIPHERAL NEUROPATHY 08/03/2007   Qualifier: Diagnosis of  By: Marinda Elk MD, Sonia Side    . DM neuropathy, painful ([place])   . Dyspnea   . ESRD (end stage renal disease) ([place])    Stage 4  . Hearing loss in right ear   . Hyperlipidemia   . Hypertension   . Hypertensive crisis 07/28/2012  . Hypertensive urgency 08/20/2014  . Myocardial infarction ([place])   . Nephrotic syndrome 02/18/2013   A 24-hour urine collection 03/04/2013 showed total protein of 5,460 g and creatinine clearance of 80 mL/minute.  Patient was seen by Seward Meth at [place] and a repeat 24-hour urine showed 10,407 mg protein.  Patient underwent kidney biopsy on 05/30/2013; pathology showed advanced diffuse and nodular diabetic nephropathy with vascular changes consistent with long-standing difficult to control hypertension.     [name]  Pneumonia   . Renal insufficiency   . Sleep apnea    uses cpap    Patient Active Problem List   Diagnosis Date Noted  . Aortic atherosclerosis ([place]) 12/07/2017  . CKD (chronic kidney disease) 11/24/2017  . S/P arteriovenous (AV) fistula creation 11/24/2017  . Dyspnea 11/08/2017  . Left renal mass 09/06/2017  . Acute on chronic combined systolic and diastolic CHF (congestive heart failure) ([place]) 02/25/2017  . Piriformis syndrome of right side 03/03/2016   . Muscle spasm of left shoulder area 03/03/2016  . Secondary hyperparathyroidism, renal ([place]) 02/09/2016  . Primary osteoarthritis of left knee 01/07/2016  . Low back pain 01/07/2016  . Joint stiffness 11/13/2015  . Age-related nuclear cataract of both eyes 10/07/2015  . Normocytic anemia 08/22/2014  . Health care maintenance 05/16/2014  . Nephrotic syndrome in diseases classified elsewhere 02/18/2013  . Obstructive sleep apnea 07/26/2012  . History of stroke 07/04/2012  . 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 ED (erectile dysfunction) 04/18/2012  . Benign neoplasm of colon 07/02/2011  . Chronic combined systolic and diastolic congestive heart failure ([place]) 05/21/2010  . Carotid bruit 04/01/2009  . Type 2 diabetes mellitus with diabetic neuropathy ([place]) 08/03/2007  . Hearing loss 07/30/2007  . Controlled type 2 diabetes mellitus with chronic kidney disease ([place]) 09/15/2006  . Other and unspecified hyperlipidemia 06/01/2006  . Essential (primary) hypertension 06/01/2006    Past Surgical History:  Procedure Laterality Date  . AV FISTULA PLACEMENT Left 11/24/2017   Procedure: INSERTION OF ARTERIOVENOUS (AV) GORE-TEX GRAFT LEFT LOWER ARM;  Surgeon: Rosetta Posner, MD;  Location: [facility];  Service: Vascular;  Laterality: Left;  . CARDIAC CATHETERIZATION     3 times  . COLONOSCOPY    . FOOT SURGERY    . POLYPECTOMY          Home Medications    Prior to Admission medications   Medication Sig Start Date End Date Taking? Authorizing Provider  calcitRIOL (ROCALTROL) 0.25 MCG capsule Take 0.25 mcg by mouth every Monday, Wednesday, and Friday.  03/25/17   [provider]  CARTIA XT 180 MG 24 hr capsule TAKE 1 CAPSULE(180 MG) BY MOUTH DAILY 09/07/17   Lucious Groves, DO  carvedilol (COREG) 25 MG tablet Take 25 mg by mouth 2 (two) times daily with a meal.  09/06/17   [provider]  clopidogrel (PLAVIX) 75 MG tablet Take 1  tablet (75 mg total) by mouth daily. 09/07/17   Lucious Groves, DO  fluticasone (FLONASE) 50 MCG/ACT nasal spray Place 2 sprays daily into both nostrils. 07/01/17   Noemi Chapel, MD  furosemide (LASIX) 80 MG tablet Take 1 tablet (80 mg total) by mouth 2 (two) times daily. 09/07/17   Lucious Groves, DO  gabapentin (NEURONTIN) 300 MG capsule Take 300 mg by mouth at bedtime Patient taking differently: Take 300 mg by mouth at bedtime.  11/16/16   Lucious Groves, DO  hydrALAZINE (APRESOLINE) 100 MG tablet Take 1 tablet (100 mg total) by mouth 3 (three) times daily. 09/07/17 09/16/18  Lucious Groves, DO  Insulin Pen Needle (NOVOFINE PLUS) 32G X 4 MM MISC 1 Dose by Does not apply route daily. For Victoza, substitution ok for cheaper brand 09/08/17   Lucious Groves, DO  liraglutide (VICTOZA) 18 MG/3ML SOPN Inject 0.1 mLs (0.6 mg total) into the skin daily. 12/07/17   Lucious Groves, DO  pravastatin (PRAVACHOL) 40 MG tablet Take 1  tablet (40 mg total) by mouth daily. 09/07/17   Lucious Groves, DO  promethazine (PHENERGAN) 25 MG tablet Take 1 tablet (25 mg total) by mouth every 6 (six) hours as needed for nausea or vomiting. 12/07/17   Lucious Groves, DO    Family History Family History  Problem Relation Age of Onset  . Aneurysm Father 65       died of rupture  . Colon cancer Sister     Social History Social History   Tobacco Use  . Smoking status: Never Smoker  . Smokeless tobacco: Never Used  Substance Use Topics  . Alcohol use: No    Alcohol/week: 0.0 oz  . Drug use: No     Allergies   Ivp dye [iodinated diagnostic agents]; Amlodipine; Lisinopril; and Red dye   Review of Systems Review of Systems  Constitutional: Negative for appetite change and fatigue.  HENT: Negative for congestion, ear discharge and sinus pressure.   Eyes: Negative for discharge.  Respiratory: Negative for cough.   Cardiovascular: Negative for chest pain.  Gastrointestinal: Negative for abdominal pain and  diarrhea.  Genitourinary: Negative for frequency and hematuria.  Musculoskeletal: Negative for back pain.       Left arm pain  Skin: Negative for rash.  Neurological: Negative for seizures and headaches.  Psychiatric/Behavioral: Negative for hallucinations.     Physical Exam Updated Vital Signs BP (!) 152/69 (BP Location: Right Arm)   Pulse 66   Temp 98.3 F (36.8 C) (Oral)   Resp 18   Ht 6' (1.829 m)   Wt 101.2 kg (223 lb) Comment: Simultaneous filing. User may not have seen previous data.  SpO2 98%   BMI 30.24 kg/m   Physical Exam  Constitutional: He is oriented to person, place, and time. He appears well-developed.  HENT:  Head: Normocephalic.  Eyes: Conjunctivae and EOM are normal. No scleral icterus.  Neck: Neck supple. No thyromegaly present.  Cardiovascular: Normal rate and regular rhythm. Exam reveals no gallop and no friction rub.  No murmur heard. Pulmonary/Chest: No stridor. He has no wheezes. He has no rales. He exhibits no tenderness.  Abdominal: He exhibits no distension. There is no tenderness. There is no rebound.  Musculoskeletal: Normal range of motion. He exhibits no edema.  Patient has minimal tenderness to left forearm.  Patient has a dialysis graft there with a good thrill  Lymphadenopathy:    He has no cervical adenopathy.  Neurological: He is oriented to person, place, and time. He exhibits normal muscle tone. Coordination normal.  Skin: No rash noted. No erythema.  Psychiatric: He has a normal mood and affect. His behavior is normal.     ED Treatments / Results  Labs (all labs ordered are listed, but only abnormal results are displayed) Labs Reviewed - No data to display  EKG None  Radiology Dg Forearm Left  Result Date: 12/17/2017 CLINICAL DATA:  FALL, PATIENT STATES " HE FELL AND LANDED ON LEFT ARM", LEFT FOREARM PAIN, PATIENT POINT TO LEFT POSTERIOR MID SHAFT FOREARM PAIN, STATES " HE ALSO JUST HAD A AV FISTULA SHUNT PLACED IN LEFT  FOREARM FOR DIALYSIS " WEAKNESS IN E.*comment was truncated* EXAM: LEFT FOREARM - 2 VIEW COMPARISON:  None. FINDINGS: No fracture of the radius or ulna. The elbow joint and the wrist joint appear normal on two views. Dialysis shunt noted. IMPRESSION: No fracture or dislocation. Electronically Signed   By: Suzy Bouchard M.D.   On: 12/17/2017 18:50    Procedures  Procedures (including critical care time)  Medications Ordered in ED Medications - No data to display   Initial Impression / Assessment and Plan / ED Course  I have reviewed the triage vital signs and the nursing notes.  Pertinent labs & imaging results that were available during my care of the patient were reviewed by me and considered in my medical decision making (see chart for details).    X-ray left forearm negative.  Patient with contusion to left arm Final Clinical Impressions(s) / ED Diagnoses   Final diagnoses:  Fall, initial encounter    ED Discharge Orders    None       [name], MD 12/17/17 1911

## 2017-12-18 ENCOUNTER — Encounter (HOSPITAL_COMMUNITY): Payer: Self-pay | Admitting: Emergency Medicine

## 2017-12-18 ENCOUNTER — Other Ambulatory Visit: Payer: Self-pay

## 2017-12-18 ENCOUNTER — Emergency Department (HOSPITAL_COMMUNITY)
Admission: EM | Admit: 2017-12-18 | Discharge: 2017-12-18 | Disposition: A | Discharge status: Home / Self Care | Payer: 59 | Attending: Emergency Medicine | Admitting: Emergency Medicine

## 2017-12-18 DIAGNOSIS — I5042 Chronic combined systolic (congestive) and diastolic (congestive) heart failure: Secondary | ICD-10-CM | POA: Insufficient documentation

## 2017-12-18 DIAGNOSIS — I132 Hypertensive heart and chronic kidney disease with heart failure and with stage 5 chronic kidney disease, or end stage renal disease: Secondary | ICD-10-CM | POA: Insufficient documentation

## 2017-12-18 DIAGNOSIS — Y999 Unspecified external cause status: Secondary | ICD-10-CM | POA: Diagnosis not present

## 2017-12-18 DIAGNOSIS — Y929 Unspecified place or not applicable: Secondary | ICD-10-CM | POA: Diagnosis not present

## 2017-12-18 DIAGNOSIS — Y939 Activity, unspecified: Secondary | ICD-10-CM | POA: Diagnosis not present

## 2017-12-18 DIAGNOSIS — Z794 Long term (current) use of insulin: Secondary | ICD-10-CM | POA: Insufficient documentation

## 2017-12-18 DIAGNOSIS — Z79899 Other long term (current) drug therapy: Secondary | ICD-10-CM | POA: Diagnosis not present

## 2017-12-18 DIAGNOSIS — S59912A Unspecified injury of left forearm, initial encounter: Secondary | ICD-10-CM | POA: Diagnosis present

## 2017-12-18 DIAGNOSIS — S51812A Laceration without foreign body of left forearm, initial encounter: Secondary | ICD-10-CM | POA: Insufficient documentation

## 2017-12-18 DIAGNOSIS — N186 End stage renal disease: Secondary | ICD-10-CM | POA: Insufficient documentation

## 2017-12-18 DIAGNOSIS — Z992 Dependence on renal dialysis: Secondary | ICD-10-CM | POA: Insufficient documentation

## 2017-12-18 DIAGNOSIS — W06XXXA Fall from bed, initial encounter: Secondary | ICD-10-CM | POA: Insufficient documentation

## 2017-12-18 NOTE — ED Provider Notes (Signed)
[facility] EMERGENCY DEPARTMENT Provider Note   CSN: [id number] Arrival date & time: 12/18/17  1806     History   Chief Complaint Chief Complaint  Patient presents with  . Laceration    HPI Charles Hart is a 62 y.o. male.  Patient is a 62 year old male who presents to the emergency department with a complaint of laceration to the left arm.  The patient states that 2 weeks ago he had a shunt put in his left arm for dialysis.  He has not had dialysis up to this point.  On yesterday he fell out of the bed, and injured the left arm.  He was evaluated, and x-ray was negative for acute problem.  He states that 1 of the areas where he had the operation he had been picking at the glue, and when he fell that a part of the area opened up and he has been having problems with bleeding on 2 occasions today.  He presents to the emergency department now for evaluation of this laceration area.  The history is provided by the patient.  Laceration      Past Medical History:  Diagnosis Date  . Adenomatous colon polyp 07/02/2011   Last colonoscopy May 06, 2011 by Dr. Owens Loffler, who recommended repeat colonoscopy in 5 years.   . Anemia   . Background diabetic retinopathy 04/20/2012   Patient is followed by Dr. Katy Fitch   . Cardiomyopathy    LV function improved from 2004 to 2008.  Historically, moderately dilated LV with EF 30-40% by 2D echo 08/14/2002.  Mild CAD with severe LV dysfunction by cardiac cath 09/2002.  Normal coronary arteries and normal LV function by cardiac cath 09/19/2006.  A 2-D echo on 04/01/2009 showed mild concentric hypertrophy and normal systolic (LVEF  60-63%) and doppler C/W with grade 1 diastolic dysfunction.  . CHF (congestive heart failure) ([place])    LV function improved from 2004 to 2008.  Historically, moderately dilated LV with EF 30-40% by 2D echo 08/14/2002.  Mild CAD with severe LV dysfunction by cardiac cath 09/2002.  Normal coronary arteries and normal LV  function by cardiac cath 09/19/2006.  A 2-D echo on 04/01/2009 showed mild concentric hypertrophy and normal systolic (LVEF  01-60%) and doppler C/W with grade 1 diastolic dysfunction..   . Chronic combined systolic and diastolic congestive heart failure ([place]) 05/21/2010   LV function improved from 2004 to 2008.  Historically, moderately dilated LV with EF 30-40% by 2D echo 08/14/2002.  Mild CAD with severe LV dysfunction by cardiac cath 09/2002.  Normal coronary arteries and normal LV function by cardiac cath 09/19/2006.  A 2-D echo on 04/01/2009 showed mild concentric hypertrophy and normal systolic (LVEF  10-93%) and doppler parameters consistent with abnormal left   . CVA (cerebral vascular accident) ([place]) 07/04/2012   MRI of the brain 07/04/2012 showed an acute infarct in the right basal ganglia involving the anterior putamen, anterior limb internal capsule, and head of the caudate; this measured approximately 2.5 cm in diameter.     . Dermatitis   . Diabetes mellitus    type 2  . DIABETIC PERIPHERAL NEUROPATHY 08/03/2007   Qualifier: Diagnosis of  By: Marinda Elk MD, Sonia Side    . DM neuropathy, painful ([place])   . Dyspnea   . ESRD (end stage renal disease) ([place])    Stage 4  . Hearing loss in right ear   . Hyperlipidemia   . Hypertension   . Hypertensive crisis 07/28/2012  .  Hypertensive urgency 08/20/2014  . Myocardial infarction ([place])   . Nephrotic syndrome 02/18/2013   A 24-hour urine collection 03/04/2013 showed total protein of 5,460 g and creatinine clearance of 80 mL/minute.  Patient was seen by Seward Meth at [place] and a repeat 24-hour urine showed 10,407 mg protein.  Patient underwent kidney biopsy on 05/30/2013; pathology showed advanced diffuse and nodular diabetic nephropathy with vascular changes consistent with long-standing difficult to control hypertension.     . Pneumonia   . Renal insufficiency   . Sleep apnea    uses cpap    Patient Active Problem  List   Diagnosis Date Noted  . Aortic atherosclerosis ([place]) 12/07/2017  . CKD (chronic kidney disease) 11/24/2017  . S/P arteriovenous (AV) fistula creation 11/24/2017  . Dyspnea 11/08/2017  . Left renal mass 09/06/2017  . Acute on chronic combined systolic and diastolic CHF (congestive heart failure) ([place]) 02/25/2017  . Piriformis syndrome of right side 03/03/2016  . Muscle spasm of left shoulder area 03/03/2016  . Secondary hyperparathyroidism, renal ([place]) 02/09/2016  . Primary osteoarthritis of left knee 01/07/2016  . Low back pain 01/07/2016  . Joint stiffness 11/13/2015  . Age-related nuclear cataract of both eyes 10/07/2015  . Normocytic anemia 08/22/2014  . Health care maintenance 05/16/2014  . Nephrotic syndrome in diseases classified elsewhere 02/18/2013  . Obstructive sleep apnea 07/26/2012  . History of stroke 07/04/2012  . 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 ED (erectile dysfunction) 04/18/2012  . Benign neoplasm of colon 07/02/2011  . Chronic combined systolic and diastolic congestive heart failure ([place]) 05/21/2010  . Carotid bruit 04/01/2009  . Type 2 diabetes mellitus with diabetic neuropathy ([place]) 08/03/2007  . Hearing loss 07/30/2007  . Controlled type 2 diabetes mellitus with chronic kidney disease ([place]) 09/15/2006  . Other and unspecified hyperlipidemia 06/01/2006  . Essential (primary) hypertension 06/01/2006    Past Surgical History:  Procedure Laterality Date  . AV FISTULA PLACEMENT Left 11/24/2017   Procedure: INSERTION OF ARTERIOVENOUS (AV) GORE-TEX GRAFT LEFT LOWER ARM;  Surgeon: Rosetta Posner, MD;  Location: [facility];  Service: Vascular;  Laterality: Left;  . CARDIAC CATHETERIZATION     3 times  . COLONOSCOPY    . FOOT SURGERY    . POLYPECTOMY          Home Medications    Prior to Admission medications   Medication Sig Start Date End Date Taking? Authorizing Provider  calcitRIOL (ROCALTROL) 0.25  MCG capsule Take 0.25 mcg by mouth every Monday, Wednesday, and Friday.  03/25/17   [provider]  CARTIA XT 180 MG 24 hr capsule TAKE 1 CAPSULE(180 MG) BY MOUTH DAILY 09/07/17   Lucious Groves, DO  carvedilol (COREG) 25 MG tablet Take 25 mg by mouth 2 (two) times daily with a meal.  09/06/17   [provider]  clopidogrel (PLAVIX) 75 MG tablet Take 1 tablet (75 mg total) by mouth daily. 09/07/17   Lucious Groves, DO  fluticasone (FLONASE) 50 MCG/ACT nasal spray Place 2 sprays daily into both nostrils. 07/01/17   Noemi Chapel, MD  furosemide (LASIX) 80 MG tablet Take 1 tablet (80 mg total) by mouth 2 (two) times daily. 09/07/17   Lucious Groves, DO  gabapentin (NEURONTIN) 300 MG capsule Take 300 mg by mouth at bedtime Patient taking differently: Take 300 mg by mouth at bedtime.  11/16/16   Lucious Groves, DO  hydrALAZINE (APRESOLINE) 100  MG tablet Take 1 tablet (100 mg total) by mouth 3 (three) times daily. 09/07/17 09/16/18  Lucious Groves, DO  Insulin Pen Needle (NOVOFINE PLUS) 32G X 4 MM MISC 1 Dose by Does not apply route daily. For Victoza, substitution ok for cheaper brand 09/08/17   Lucious Groves, DO  liraglutide (VICTOZA) 18 MG/3ML SOPN Inject 0.1 mLs (0.6 mg total) into the skin daily. 12/07/17   Lucious Groves, DO  pravastatin (PRAVACHOL) 40 MG tablet Take 1 tablet (40 mg total) by mouth daily. 09/07/17   Lucious Groves, DO  promethazine (PHENERGAN) 25 MG tablet Take 1 tablet (25 mg total) by mouth every 6 (six) hours as needed for nausea or vomiting. 12/07/17   Lucious Groves, DO    Family History Family History  Problem Relation Age of Onset  . Aneurysm Father 62       died of rupture  . Colon cancer Sister     Social History Social History   Tobacco Use  . Smoking status: Never Smoker  . Smokeless tobacco: Never Used  Substance Use Topics  . Alcohol use: No    Alcohol/week: 0.0 oz  . Drug use: No     Allergies   Ivp dye [iodinated diagnostic agents];  Amlodipine; Lisinopril; and Red dye   Review of Systems Review of Systems  Constitutional: Negative for activity change.       All ROS Neg except as noted in HPI  HENT: Negative for nosebleeds.   Eyes: Negative for photophobia and discharge.  Respiratory: Negative for cough, shortness of breath and wheezing.   Cardiovascular: Negative for chest pain and palpitations.  Gastrointestinal: Negative for abdominal pain and blood in stool.  Genitourinary: Negative for dysuria, frequency and hematuria.  Musculoskeletal: Negative for arthralgias, back pain and neck pain.       Shallow laceration of the arm  Skin: Negative.   Neurological: Negative for dizziness, seizures and speech difficulty.  Psychiatric/Behavioral: Negative for confusion and hallucinations.     Physical Exam Updated Vital Signs BP (!) 169/78 (BP Location: Right Arm)   Pulse 78   Temp 97.7 F (36.5 C) (Oral)   Resp 15   Ht 6' (1.829 m)   Wt 101.2 kg (223 lb)   SpO2 99%   BMI 30.24 kg/m   Physical Exam  Constitutional: He is oriented to person, place, and time. He appears well-developed and well-nourished.  Non-toxic appearance.  HENT:  Head: Normocephalic.  Right Ear: Tympanic membrane and external ear normal.  Left Ear: Tympanic membrane and external ear normal.  Eyes: Pupils are equal, round, and reactive to light. EOM and lids are normal.  Neck: Normal range of motion. Neck supple. Carotid bruit is not present.  Cardiovascular: Normal rate, regular rhythm, normal heart sounds, intact distal pulses and normal pulses.  Pulmonary/Chest: Breath sounds normal. No respiratory distress.  Abdominal: Soft. Bowel sounds are normal. There is no tenderness. There is no guarding.  Musculoskeletal: Normal range of motion.       Left forearm: He exhibits laceration.       Arms: No active bleeding at this time.  1 cm laceration at the edge of a surgical site.  Patient recently had shunt put in the left arm for dialysis.   1 of the surgery areas is slightly open.  Good thrill over the dialysis shunt area.  Lymphadenopathy:       Head (right side): No submandibular adenopathy present.  Head (left side): No submandibular adenopathy present.    He has no cervical adenopathy.  Neurological: He is alert and oriented to person, place, and time. He has normal strength. No cranial nerve deficit or sensory deficit.  Skin: Skin is warm and dry.  Psychiatric: He has a normal mood and affect. His speech is normal.  Nursing note and vitals reviewed.    ED Treatments / Results  Labs (all labs ordered are listed, but only abnormal results are displayed) Labs Reviewed - No data to display  EKG None  Radiology Dg Forearm Left  Result Date: 12/17/2017 CLINICAL DATA:  FALL, PATIENT STATES " HE FELL AND LANDED ON LEFT ARM", LEFT FOREARM PAIN, PATIENT POINT TO LEFT POSTERIOR MID SHAFT FOREARM PAIN, STATES " HE ALSO JUST HAD A AV FISTULA SHUNT PLACED IN LEFT FOREARM FOR DIALYSIS " WEAKNESS IN E.*comment was truncated* EXAM: LEFT FOREARM - 2 VIEW COMPARISON:  None. FINDINGS: No fracture of the radius or ulna. The elbow joint and the wrist joint appear normal on two views. Dialysis shunt noted. IMPRESSION: No fracture or dislocation. Electronically Signed   By: Suzy Bouchard M.D.   On: 12/17/2017 18:50    Procedures .Marland KitchenLaceration Repair Date/Time: 12/18/2017 7:02 PM Performed by: Lily Kocher, PA-C Authorized by: Lily Kocher, PA-C   Consent:    Consent obtained:  Verbal   Consent given by:  Patient   Risks discussed:  Infection, poor cosmetic result and poor wound healing Laceration details:    Location:  Shoulder/arm   Shoulder/arm location:  L lower arm   Length (cm):  1 Repair type:    Repair type:  Simple Pre-procedure details:    Preparation:  Patient was prepped and draped in usual sterile fashion Exploration:    Wound exploration: wound explored through full range of motion     Wound extent: no  tendon damage noted and no underlying fracture noted   Treatment:    Wound cleansed with: alcohol.   Amount of cleaning:  Standard Skin repair:    Repair method:  Steri-Strips   Number of Steri-Strips:  3 Approximation:    Approximation:  Loose Post-procedure details:    Dressing:  Non-adherent dressing   Patient tolerance of procedure:  Tolerated well, no immediate complications   (including critical care time)  Medications Ordered in ED Medications - No data to display   Initial Impression / Assessment and Plan / ED Course  I have reviewed the triage vital signs and the nursing notes.  Pertinent labs & imaging results that were available during my care of the patient were reviewed by me and considered in my medical decision making (see chart for details).       Final Clinical Impressions(s) / ED Diagnoses MDM  Vital signs within normal limits, with the exception of the blood pressure being 169/78.  The patient sustained a fall on yesterday and had a previous surgical area of the left arm to open up.  He had bleeding on 2 occasions today.  The wound was cleansed and then repaired with Steri-Strips.  I have asked the patient to let the Steri-Strips come off on their own.  The patient is to notify his dialysis physicians or return to the emergency department if any signs of advancing infection.  Patient is in agreement with this plan.   Final diagnoses:  Laceration of left forearm, initial encounter    ED Discharge Orders    None       Lily Kocher, PA-C  12/18/17 1910    Hayden Rasmussen, MD 12/19/17 [phone]

## 2017-12-18 NOTE — Discharge Instructions (Addendum)
Your laceration was repaired with Steri-Strips.  These will come off in about 4 or 5 days.  Please do not pull on them, allow them to come off on their own.  Please use the Ace bandage loosely to the site for the next 48 hours.  After 48hours you do not have to use the Ace bandage.  Please do not put any lotion, Neosporin, or petroleum products on the Steri-Strips as this will break down the glue.  Please see your primary physician, or return to the emergency department if any pus like drainage coming from the wound, red streaks going up the arm, or signs of advancing infection.

## 2017-12-18 NOTE — ED Triage Notes (Addendum)
Pt state he went to town and go back in his car, saw blood on his arm. Small open wound area to the end of surgical scar. Unsure of what happened. Pt has a new graft for dialysis on left arm.

## 2017-12-19 ENCOUNTER — Other Ambulatory Visit: Payer: Self-pay | Admitting: Internal Medicine

## 2017-12-28 ENCOUNTER — Emergency Department (HOSPITAL_COMMUNITY): Payer: 59

## 2017-12-28 ENCOUNTER — Encounter (HOSPITAL_COMMUNITY): Payer: Self-pay | Admitting: Emergency Medicine

## 2017-12-28 ENCOUNTER — Emergency Department (HOSPITAL_COMMUNITY)
Admission: EM | Admit: 2017-12-28 | Discharge: 2017-12-28 | Disposition: A | Discharge status: Home / Self Care | Payer: 59 | Attending: Emergency Medicine | Admitting: Emergency Medicine

## 2017-12-28 ENCOUNTER — Other Ambulatory Visit: Payer: Self-pay

## 2017-12-28 DIAGNOSIS — R197 Diarrhea, unspecified: Secondary | ICD-10-CM | POA: Insufficient documentation

## 2017-12-28 DIAGNOSIS — I2510 Atherosclerotic heart disease of native coronary artery without angina pectoris: Secondary | ICD-10-CM | POA: Insufficient documentation

## 2017-12-28 DIAGNOSIS — I130 Hypertensive heart and chronic kidney disease with heart failure and stage 1 through stage 4 chronic kidney disease, or unspecified chronic kidney disease: Secondary | ICD-10-CM | POA: Diagnosis not present

## 2017-12-28 DIAGNOSIS — E1122 Type 2 diabetes mellitus with diabetic chronic kidney disease: Secondary | ICD-10-CM | POA: Insufficient documentation

## 2017-12-28 DIAGNOSIS — Z79899 Other long term (current) drug therapy: Secondary | ICD-10-CM | POA: Diagnosis not present

## 2017-12-28 DIAGNOSIS — R358 Other polyuria: Secondary | ICD-10-CM | POA: Diagnosis not present

## 2017-12-28 DIAGNOSIS — N184 Chronic kidney disease, stage 4 (severe): Secondary | ICD-10-CM | POA: Diagnosis not present

## 2017-12-28 DIAGNOSIS — I5042 Chronic combined systolic (congestive) and diastolic (congestive) heart failure: Secondary | ICD-10-CM | POA: Insufficient documentation

## 2017-12-28 DIAGNOSIS — R609 Edema, unspecified: Secondary | ICD-10-CM | POA: Insufficient documentation

## 2017-12-28 DIAGNOSIS — R0600 Dyspnea, unspecified: Secondary | ICD-10-CM | POA: Insufficient documentation

## 2017-12-28 DIAGNOSIS — Z7984 Long term (current) use of oral hypoglycemic drugs: Secondary | ICD-10-CM | POA: Diagnosis not present

## 2017-12-28 DIAGNOSIS — Z7902 Long term (current) use of antithrombotics/antiplatelets: Secondary | ICD-10-CM | POA: Diagnosis not present

## 2017-12-28 LAB — CBC WITH DIFFERENTIAL/PLATELET
Basophils Absolute: 0 10*3/uL (ref 0.0–0.1)
Basophils Relative: 0 %
EOS PCT: 5 %
Eosinophils Absolute: 0.2 10*3/uL (ref 0.0–0.7)
HCT: 28.4 % — ABNORMAL LOW (ref 39.0–52.0)
HEMOGLOBIN: 9.3 g/dL — AB (ref 13.0–17.0)
LYMPHS ABS: 0.8 10*3/uL (ref 0.7–4.0)
Lymphocytes Relative: 18 %
MCH: 28.4 pg (ref 26.0–34.0)
MCHC: 32.7 g/dL (ref 30.0–36.0)
MCV: 86.9 fL (ref 78.0–100.0)
MONOS PCT: 9 %
Monocytes Absolute: 0.4 10*3/uL (ref 0.1–1.0)
Neutro Abs: 3.2 10*3/uL (ref 1.7–7.7)
Neutrophils Relative %: 68 %
PLATELETS: 183 10*3/uL (ref 150–400)
RBC: 3.27 MIL/uL — ABNORMAL LOW (ref 4.22–5.81)
RDW: 12.6 % (ref 11.5–15.5)
WBC: 4.6 10*3/uL (ref 4.0–10.5)

## 2017-12-28 LAB — COMPREHENSIVE METABOLIC PANEL
ALT: 9 U/L — ABNORMAL LOW (ref 17–63)
ANION GAP: 11 (ref 5–15)
AST: 14 U/L — ABNORMAL LOW (ref 15–41)
Albumin: 4.1 g/dL (ref 3.5–5.0)
Alkaline Phosphatase: 53 U/L (ref 38–126)
BILIRUBIN TOTAL: 1 mg/dL (ref 0.3–1.2)
BUN: 65 mg/dL — ABNORMAL HIGH (ref 6–20)
CO2: 23 mmol/L (ref 22–32)
Calcium: 7.8 mg/dL — ABNORMAL LOW (ref 8.9–10.3)
Chloride: 104 mmol/L (ref 101–111)
Creatinine, Ser: 5.51 mg/dL — ABNORMAL HIGH (ref 0.61–1.24)
GFR calc Af Amer: 12 mL/min — ABNORMAL LOW (ref >60)
GFR, EST NON AFRICAN AMERICAN: 10 mL/min — AB (ref >60)
Glucose, Bld: 109 mg/dL — ABNORMAL HIGH (ref 65–99)
POTASSIUM: 3.4 mmol/L — AB (ref 3.5–5.1)
Sodium: 138 mmol/L (ref 135–145)
TOTAL PROTEIN: 7.5 g/dL (ref 6.5–8.1)

## 2017-12-28 LAB — TROPONIN I
TROPONIN I: 0.06 ng/mL — AB (ref <0.03)
Troponin I: 0.06 ng/mL (ref <0.03)

## 2017-12-28 LAB — BRAIN NATRIURETIC PEPTIDE: B NATRIURETIC PEPTIDE 5: 2664 pg/mL — AB (ref 0.0–100.0)

## 2017-12-28 LAB — POC OCCULT BLOOD, ED: Fecal Occult Bld: NEGATIVE

## 2017-12-28 MED ORDER — FUROSEMIDE 10 MG/ML IJ SOLN
80.0000 mg | Freq: Once | INTRAMUSCULAR | Status: AC
  Administered 2017-12-28: 80 mg via INTRAVENOUS
  Filled 2017-12-28: qty 8

## 2017-12-28 NOTE — ED Notes (Signed)
CRITICAL VALUE ALERT  Critical Value:  Trop 0.06  Date & Time Notied:  12/28/17, 1334  Provider Notified: Dr. Lita Mains  Orders Received/Actions taken: no new orders

## 2017-12-28 NOTE — ED Triage Notes (Signed)
Pt c/o SOB especially with exertion or lying back x 2 days. Reports hx of CHF, but has not taken any of his medications for a couple days due to diarrhea. Denies n/v or abdominal pain. Denies lower extremity edema.

## 2017-12-28 NOTE — Discharge Instructions (Addendum)
Take your Lasix as prescribed.  Make an appointment follow-up with your primary physician.

## 2017-12-28 NOTE — ED Notes (Signed)
Pt ambulated well. Denies any sob. 02 sats remained 98%. Hr ranged between 90-107.

## 2017-12-28 NOTE — ED Notes (Signed)
Phlebotomy at bedside.

## 2017-12-28 NOTE — ED Notes (Signed)
Patient transported to X-ray 

## 2017-12-28 NOTE — ED Notes (Signed)
EDP at bedside  

## 2017-12-28 NOTE — ED Notes (Signed)
CRITICAL VALUE ALERT  Critical Value:  Troponin 0.06  Date & Time Notied:  12/28/2017, 0930  Provider Notified: Dr. Lita Mains  Orders Received/Actions taken: see chart

## 2017-12-28 NOTE — ED Provider Notes (Signed)
[facility] EMERGENCY DEPARTMENT Provider Note   CSN: [id number] Arrival date & time: 12/28/17  6433     History   Chief Complaint Chief Complaint  Patient presents with  . Shortness of Breath    HPI Charles Hart is a 62 y.o. male.  HPI She states he has had progressive shortness of breath over the last 2 days.  Worse with lying flat.  Denies chest pain, cough, fever or chills.  Denies any new lower extremity swelling or pain.  States he has had diarrhea for the past 6 days.  Multiple times daily.  No gross blood in the stool.  Has noticed some dark liquidy stool. Past Medical History:  Diagnosis Date  . Adenomatous colon polyp 07/02/2011   Last colonoscopy May 06, 2011 by Dr. Owens Loffler, who recommended repeat colonoscopy in 5 years.   . Anemia   . Background diabetic retinopathy 04/20/2012   Patient is followed by Dr. Katy Fitch   . Cardiomyopathy    LV function improved from 2004 to 2008.  Historically, moderately dilated LV with EF 30-40% by 2D echo 08/14/2002.  Mild CAD with severe LV dysfunction by cardiac cath 09/2002.  Normal coronary arteries and normal LV function by cardiac cath 09/19/2006.  A 2-D echo on 04/01/2009 showed mild concentric hypertrophy and normal systolic (LVEF  29-51%) and doppler C/W with grade 1 diastolic dysfunction.  . CHF (congestive heart failure) ([place])    LV function improved from 2004 to 2008.  Historically, moderately dilated LV with EF 30-40% by 2D echo 08/14/2002.  Mild CAD with severe LV dysfunction by cardiac cath 09/2002.  Normal coronary arteries and normal LV function by cardiac cath 09/19/2006.  A 2-D echo on 04/01/2009 showed mild concentric hypertrophy and normal systolic (LVEF  88-41%) and doppler C/W with grade 1 diastolic dysfunction..   . Chronic combined systolic and diastolic congestive heart failure ([place]) 05/21/2010   LV function improved from 2004 to 2008.  Historically, moderately dilated LV with EF 30-40% by 2D echo 08/14/2002.   Mild CAD with severe LV dysfunction by cardiac cath 09/2002.  Normal coronary arteries and normal LV function by cardiac cath 09/19/2006.  A 2-D echo on 04/01/2009 showed mild concentric hypertrophy and normal systolic (LVEF  66-06%) and doppler parameters consistent with abnormal left   . CVA (cerebral vascular accident) ([place]) 07/04/2012   MRI of the brain 07/04/2012 showed an acute infarct in the right basal ganglia involving the anterior putamen, anterior limb internal capsule, and head of the caudate; this measured approximately 2.5 cm in diameter.     . Dermatitis   . Diabetes mellitus    type 2  . DIABETIC PERIPHERAL NEUROPATHY 08/03/2007   Qualifier: Diagnosis of  By: Marinda Elk MD, Sonia Side    . DM neuropathy, painful ([place])   . Dyspnea   . 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 Hearing loss in right ear   . Hyperlipidemia   . Hypertension   . Hypertensive crisis 07/28/2012  . Hypertensive urgency 08/20/2014  . Myocardial infarction ([place])   . Nephrotic syndrome 02/18/2013   A 24-hour urine collection 03/04/2013 showed total protein of 5,460 g and creatinine clearance of 80 mL/minute.  Patient was seen by Seward Meth at [facility] and a repeat 24-hour urine showed 10,407 mg protein.  Patient underwent kidney biopsy on 05/30/2013; pathology showed advanced diffuse and nodular diabetic nephropathy with vascular changes consistent with long-standing difficult to control  hypertension.     . Pneumonia   . Renal insufficiency   . Sleep apnea    uses cpap    Patient Active Problem List   Diagnosis Date Noted  . Aortic atherosclerosis ([place]) 12/07/2017  . CKD (chronic kidney disease) 11/24/2017  . S/P arteriovenous (AV) fistula creation 11/24/2017  . Dyspnea 11/08/2017  . Left renal mass 09/06/2017  . Acute on chronic combined systolic and diastolic CHF (congestive heart failure) ([place]) 02/25/2017  . Piriformis syndrome of right side 03/03/2016  . Muscle  spasm of left shoulder area 03/03/2016  . Secondary hyperparathyroidism, renal ([place]) 02/09/2016  . Primary osteoarthritis of left knee 01/07/2016  . Low back pain 01/07/2016  . Joint stiffness 11/13/2015  . Age-related nuclear cataract of both eyes 10/07/2015  . Normocytic anemia 08/22/2014  . Health care maintenance 05/16/2014  . Nephrotic syndrome in diseases classified elsewhere 02/18/2013  . Obstructive sleep apnea 07/26/2012  . History of stroke 07/04/2012  . 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 ED (erectile dysfunction) 04/18/2012  . Benign neoplasm of colon 07/02/2011  . Chronic combined systolic and diastolic congestive heart failure ([place]) 05/21/2010  . Carotid bruit 04/01/2009  . Type 2 diabetes mellitus with diabetic neuropathy ([place]) 08/03/2007  . Hearing loss 07/30/2007  . Controlled type 2 diabetes mellitus with chronic kidney disease ([place]) 09/15/2006  . Other and unspecified hyperlipidemia 06/01/2006  . Essential (primary) hypertension 06/01/2006    Past Surgical History:  Procedure Laterality Date  . AV FISTULA PLACEMENT Left 11/24/2017   Procedure: INSERTION OF ARTERIOVENOUS (AV) GORE-TEX GRAFT LEFT LOWER ARM;  Surgeon: Rosetta Posner, MD;  Location: [facility];  Service: Vascular;  Laterality: Left;  . CARDIAC CATHETERIZATION     3 times  . COLONOSCOPY    . FOOT SURGERY    . POLYPECTOMY          Home Medications    Prior to Admission medications   Medication Sig Start Date End Date Taking? Authorizing Provider  calcitRIOL (ROCALTROL) 0.25 MCG capsule Take 0.25 mcg by mouth every Monday, Wednesday, and Friday.  03/25/17  Yes [provider]  calcium acetate, Phos Binder, (PHOSLYRA) 667 MG/5ML SOLN Take 1,334 mg by mouth 3 (three) times daily with meals. 12/15/17  Yes [place], Caren Griffins, MD  CARTIA XT 180 MG 24 hr capsule TAKE 1 CAPSULE(180 MG) BY MOUTH DAILY 09/07/17  Yes Lucious Groves, DO  carvedilol (COREG) 25 MG  tablet Take 25 mg by mouth 2 (two) times daily with a meal.  09/06/17  Yes [provider]  clopidogrel (PLAVIX) 75 MG tablet Take 1 tablet (75 mg total) by mouth daily. 09/07/17  Yes Lucious Groves, DO  fluticasone (FLONASE) 50 MCG/ACT nasal spray Place 2 sprays daily into both nostrils. 07/01/17  Yes Noemi Chapel, MD  furosemide (LASIX) 80 MG tablet Take 1 tablet (80 mg total) by mouth 2 (two) times daily. 09/07/17  Yes Lucious Groves, DO  gabapentin (NEURONTIN) 300 MG capsule Take 300 mg by mouth at bedtime Patient taking differently: Take 300 mg by mouth at bedtime.  11/16/16  Yes Lucious Groves, DO  hydrALAZINE (APRESOLINE) 100 MG tablet Take 1 tablet (100 mg total) by mouth 3 (three) times daily. 09/07/17 09/16/18 Yes Lucious Groves, DO  liraglutide (VICTOZA) 18 MG/3ML SOPN Inject 0.1 mLs (0.6 mg total) into the skin daily. 12/07/17  Yes Lucious Groves, DO  pravastatin (PRAVACHOL) 40 MG tablet Take 1 tablet (  40 mg total) by mouth daily. 09/07/17  Yes Lucious Groves, DO  promethazine (PHENERGAN) 25 MG tablet Take 1 tablet (25 mg total) by mouth every 6 (six) hours as needed for nausea or vomiting. 12/07/17  Yes Lucious Groves, DO  Insulin Pen Needle (NOVOFINE PLUS) 32G X 4 MM MISC 1 Dose by Does not apply route daily. For Victoza, substitution ok for cheaper brand 09/08/17   Lucious Groves, DO    Family History Family History  Problem Relation Age of Onset  . Aneurysm Father 36       died of rupture  . Colon cancer Sister     Social History Social History   Tobacco Use  . Smoking status: Never Smoker  . Smokeless tobacco: Never Used  Substance Use Topics  . Alcohol use: No    Alcohol/week: 0.0 oz  . Drug use: No     Allergies   Ivp dye [iodinated diagnostic agents]; Amlodipine; Lisinopril; and Red dye   Review of Systems Review of Systems  Constitutional: Positive for fatigue. Negative for chills and fever.  HENT: Negative for sore throat and trouble swallowing.     Respiratory: Positive for shortness of breath. Negative for cough and chest tightness.   Cardiovascular: Negative for chest pain, palpitations and leg swelling.  Gastrointestinal: Positive for diarrhea. Negative for abdominal pain, blood in stool, constipation, nausea and vomiting.  Genitourinary: Negative for dysuria, flank pain and frequency.  Musculoskeletal: Negative for back pain, myalgias and neck pain.  Skin: Negative for rash and wound.  Neurological: Negative for dizziness, weakness, light-headedness, numbness and headaches.  All other systems reviewed and are negative.    Physical Exam Updated Vital Signs BP (!) 176/87   Pulse 86   Temp 98.5 F (36.9 C) (Oral)   Resp 20   Ht 6' (1.829 m)   Wt 101.2 kg (223 lb)   SpO2 95%   BMI 30.24 kg/m   Physical Exam  Constitutional: He is oriented to person, place, and time. He appears well-developed and well-nourished. No distress.  HENT:  Head: Normocephalic and atraumatic.  Mouth/Throat: Oropharynx is clear and moist. No oropharyngeal exudate.  Eyes: Pupils are equal, round, and reactive to light. EOM are normal.  Neck: Normal range of motion. Neck supple. No JVD present.  Cardiovascular: Normal rate and regular rhythm. Exam reveals no gallop and no friction rub.  No murmur heard. Pulmonary/Chest: Effort normal.  Diminished breath sounds in the left lung field compared to right.  No respiratory distress.  Abdominal: Soft. Bowel sounds are normal. There is no tenderness. There is no rebound and no guarding.  Musculoskeletal: Normal range of motion. He exhibits no edema or tenderness.  Left upper extremity AV fistula with palpable thrill.  No lower extremity swelling, asymmetry or tenderness.  Lymphadenopathy:    He has no cervical adenopathy.  Neurological: He is alert and oriented to person, place, and time.  Moves all extremities without focal deficit.  Sensation intact.  Skin: Skin is warm and dry. Capillary refill takes  less than 2 seconds. No rash noted. He is not diaphoretic. No erythema.  Psychiatric: He has a normal mood and affect. His behavior is normal.  Nursing note and vitals reviewed.    ED Treatments / Results  Labs (all labs ordered are listed, but only abnormal results are displayed) Labs Reviewed  CBC WITH DIFFERENTIAL/PLATELET - Abnormal; Notable for the following components:      Result Value   RBC 3.27 (*)  Hemoglobin 9.3 (*)    HCT 28.4 (*)    All other components within normal limits  COMPREHENSIVE METABOLIC PANEL - Abnormal; Notable for the following components:   Potassium 3.4 (*)    Glucose, Bld 109 (*)    BUN 65 (*)    Creatinine, Ser 5.51 (*)    Calcium 7.8 (*)    AST 14 (*)    ALT 9 (*)    GFR calc non Af Amer 10 (*)    GFR calc Af Amer 12 (*)    All other components within normal limits  BRAIN NATRIURETIC PEPTIDE - Abnormal; Notable for the following components:   B Natriuretic Peptide 2,664.0 (*)    All other components within normal limits  TROPONIN I - Abnormal; Notable for the following components:   Troponin I 0.06 (*)    All other components within normal limits  TROPONIN I - Abnormal; Notable for the following components:   Troponin I 0.06 (*)    All other components within normal limits  OCCULT BLOOD X 1 CARD TO LAB, STOOL  POC OCCULT BLOOD, ED    EKG EKG Interpretation  Date/Time:  Thursday Dec 28 2017 08:58:22 EDT Ventricular Rate:  85 PR Interval:    QRS Duration: 121 QT Interval:  443 QTC Calculation: 527 R Axis:   26 Text Interpretation:  Sinus rhythm Probable left atrial enlargement LVH with IVCD and secondary repol abnrm Prolonged QT interval Confirmed by Julianne Rice [phone]) on 12/28/2017 9:06:13 AM   Radiology Dg Chest 2 View  Result Date: 12/28/2017 CLINICAL DATA:  Shortness of breath. EXAM: CHEST - 2 VIEW COMPARISON:  Radiographs of November 08, 2017. FINDINGS: Stable cardiomediastinal silhouette. No pneumothorax or pleural  effusion is noted. Both lungs are clear. The visualized skeletal structures are unremarkable. IMPRESSION: No active cardiopulmonary disease. Electronically Signed   By: Marijo Conception, M.D.   On: 12/28/2017 09:54    Procedures Procedures (including critical care time)  Medications Ordered in ED Medications  furosemide (LASIX) injection 80 mg (80 mg Intravenous Given 12/28/17 1110)     Initial Impression / Assessment and Plan / ED Course  I have reviewed the triage vital signs and the nursing notes.  Pertinent labs & imaging results that were available during my care of the patient were reviewed by me and considered in my medical decision making (see chart for details).    She states he has not taken his Lasix for the past 2 days.  Shortness of breath likely due to fluid retention given elevation in BNP.  Will give IV Lasix and reevaluate. Patient with moderate diuresis.  Ambulating without difficulty.  No shortness of breath.  Maintaining oxygen saturation in the high 90s.  Mild elevation of troponin initially likely due to to renal insufficiency.  Repeat 3-hour troponin is unchanged.  Patient is encouraged to take his medication as prescribed and follow-up closely with his primary physician.  Return precautions given.  Final Clinical Impressions(s) / ED Diagnoses   Final diagnoses:  Dyspnea, unspecified type    ED Discharge Orders    None       Julianne Rice, MD 12/28/17 1359

## 2018-01-02 NOTE — Progress Notes (Signed)
Patient ID: Charles Hart, male   DOB: 1956-03-11, 62 y.o.   MRN: [id number] PCP:Dr Hoffman  Primary Cardiologist: Dr [name]  Nephrology: Dr Lorrene Reid [facility]   HPI:  Charles Hart is a 62 y.o.   AA with poorly controlled HTN, CVA 2013 on plavix,  Hyperlipidemia , heart caths 2004 and 2008 with normal cors, and DM complicated by CRF. History of CHF with depressed EF in the past, but ECHO  August 2014 showed recovery with EF 55-60% Most recent echo 02/27/17  EF was back down to 25-30%.  He is not on ace due to allergy and CKD.  Baseline Cr is 4.7 most recently checked 02/27/17   Indicates seen by [facility] and no fistula planned going through a divorce after 15 years of marriage Which has blind sided him some   ROS: All systems negative except as listed in HPI, PMH and Problem List.  Echo  02/23/17 Study Conclusions  - Left ventricle: The cavity size was severely dilated. Wall   thickness was increased in a pattern of moderate LVH. Systolic   function was severely reduced. The estimated ejection fraction   was in the range of 25% to 30%. Diffuse hypokinesis. Features are   consistent with a pseudonormal left ventricular filling pattern,   with concomitant abnormal relaxation and increased filling   pressure (grade 2 diastolic dysfunction). Doppler parameters are   consistent with high ventricular filling pressure. - Aortic valve: There was trivial regurgitation. - Aortic root: The aortic root was mildly dilated. - Mitral valve: Calcified annulus. There was mild regurgitation. - Left atrium: The atrium was moderately dilated. - Right atrium: The atrium was moderately dilated.  Impressions:  - Severe global reduction in LV systolic function; severe LVE;   moderate LVH; moderate diastolic dysfunction with elevated LV   filling pressure; mildly dilated aortic root; trace AI; mild Charles;   biatrial enlargement.  Carotid done 2/44/01 02-72% LICA stenosis   Seen in ER 12/28/17 for dyspnea and  diarrhea At that time Cr was 5.5 BNP 2554 Troponin .06   Given lasix iv and d/c home CXR NAD  SH:  Social History   Socioeconomic History  . Marital status: Married    Spouse name: Not on file  . Number of children: Not on file  . Years of education: Not on file  . Highest education level: Not on file  Occupational History  . Not on file  Social Needs  . Financial resource strain: Not on file  . Food insecurity:    Worry: Not on file    Inability: Not on file  . Transportation needs:    Medical: Not on file    Non-medical: Not on file  Tobacco Use  . Smoking status: Never Smoker  . Smokeless tobacco: Never Used  Substance and Sexual Activity  . Alcohol use: No    Alcohol/week: 0.0 oz  . Drug use: No  . Sexual activity: Not on file  Lifestyle  . Physical activity:    Days per week: Not on file    Minutes per session: Not on file  . Stress: Not on file  Relationships  . Social connections:    Talks on phone: Not on file    Gets together: Not on file    Attends religious service: Not on file    Active member of club or organization: Not on file    Attends meetings of clubs or organizations: Not on file    Relationship status: Not on  file  . Intimate partner violence:    Fear of current or ex partner: Not on file    Emotionally abused: Not on file    Physically abused: Not on file    Forced sexual activity: Not on file  Other Topics Concern  . Not on file  Social History Narrative  . Not on file    FH:  Family History  Problem Relation Age of Onset  . Aneurysm Father 33       died of rupture  . Colon cancer Sister     Past Medical History:  Diagnosis Date  . Adenomatous colon polyp 07/02/2011   Last colonoscopy May 06, 2011 by Dr. Owens Loffler, who recommended repeat colonoscopy in 5 years.   . Anemia   . Background diabetic retinopathy 04/20/2012   Patient is followed by Dr. Katy Fitch   . Cardiomyopathy    LV function improved from 2004 to 2008.   Historically, moderately dilated LV with EF 30-40% by 2D echo 08/14/2002.  Mild CAD with severe LV dysfunction by cardiac cath 09/2002.  Normal coronary arteries and normal LV function by cardiac cath 09/19/2006.  A 2-D echo on 04/01/2009 showed mild concentric hypertrophy and normal systolic (LVEF  07-37%) and doppler C/W with grade 1 diastolic dysfunction.  . CHF (congestive heart failure) ([place])    LV function improved from 2004 to 2008.  Historically, moderately dilated LV with EF 30-40% by 2D echo 08/14/2002.  Mild CAD with severe LV dysfunction by cardiac cath 09/2002.  Normal coronary arteries and normal LV function by cardiac cath 09/19/2006.  A 2-D echo on 04/01/2009 showed mild concentric hypertrophy and normal systolic (LVEF  10-62%) and doppler C/W with grade 1 diastolic dysfunction..   . Chronic combined systolic and diastolic congestive heart failure ([place]) 05/21/2010   LV function improved from 2004 to 2008.  Historically, moderately dilated LV with EF 30-40% by 2D echo 08/14/2002.  Mild CAD with severe LV dysfunction by cardiac cath 09/2002.  Normal coronary arteries and normal LV function by cardiac cath 09/19/2006.  A 2-D echo on 04/01/2009 showed mild concentric hypertrophy and normal systolic (LVEF  69-48%) and doppler parameters consistent with abnormal left   . CVA (cerebral vascular accident) ([place]) 07/04/2012   MRI of the brain 07/04/2012 showed an acute infarct in the right basal ganglia involving the anterior putamen, anterior limb internal capsule, and head of the caudate; this measured approximately 2.5 cm in diameter.     . Dermatitis   . Diabetes mellitus    type 2  . DIABETIC PERIPHERAL NEUROPATHY 08/03/2007   Qualifier: Diagnosis of  By: Marinda Elk MD, Sonia Side    . DM neuropathy, painful ([place])   . Dyspnea   . ESRD (end stage renal disease) ([place])    Stage 4  . Hearing loss in right ear   . Hyperlipidemia   . Hypertension   . Hypertensive crisis 07/28/2012  . Hypertensive urgency  08/20/2014  . Myocardial infarction ([place])   . Nephrotic syndrome 02/18/2013   A 24-hour urine collection 03/04/2013 showed total protein of 5,460 g and creatinine clearance of 80 mL/minute.  Patient was seen by Seward Meth at [facility] and a repeat 24-hour urine showed 10,407 mg protein.  Patient underwent kidney biopsy on 05/30/2013; pathology showed advanced diffuse and nodular diabetic nephropathy with vascular changes consistent with long-standing difficult to control hypertension.     . Pneumonia   . Renal insufficiency   . Sleep apnea  uses cpap    Current Outpatient Medications  Medication Sig Dispense Refill  . calcitRIOL (ROCALTROL) 0.25 MCG capsule Take 0.25 mcg by mouth every Monday, Wednesday, and Friday.   4  . calcium acetate, Phos Binder, (PHOSLYRA) 667 MG/5ML SOLN Take 1,334 mg by mouth 3 (three) times daily with meals.    [name] CARTIA XT 180 MG 24 hr capsule TAKE 1 CAPSULE(180 MG) BY MOUTH DAILY 90 capsule 3  . carvedilol (COREG) 25 MG tablet Take 25 mg by mouth 2 (two) times daily with a meal.     . clopidogrel (PLAVIX) 75 MG tablet Take 1 tablet (75 mg total) by mouth daily. 90 tablet 3  . fluticasone (FLONASE) 50 MCG/ACT nasal spray Place 2 sprays daily into both nostrils. 16 g 2  . furosemide (LASIX) 80 MG tablet Take 1 tablet (80 mg total) by mouth 2 (two) times daily. 180 tablet 1  . gabapentin (NEURONTIN) 300 MG capsule Take 300 mg by mouth at bedtime (Patient taking differently: Take 300 mg by mouth at bedtime. ) 90 capsule 3  . hydrALAZINE (APRESOLINE) 100 MG tablet Take 1 tablet (100 mg total) by mouth 3 (three) times daily. 270 tablet 3  . Insulin Pen Needle (NOVOFINE PLUS) 32G X 4 MM MISC 1 Dose by Does not apply route daily. For Victoza, substitution ok for cheaper brand 30 each 3  . liraglutide (VICTOZA) 18 MG/3ML SOPN Inject 0.1 mLs (0.6 mg total) into the skin daily. 9 mL 3  . pravastatin (PRAVACHOL) 40 MG tablet Take 1 tablet (40 mg  total) by mouth daily. 90 tablet 3  . promethazine (PHENERGAN) 25 MG tablet Take 1 tablet (25 mg total) by mouth every 6 (six) hours as needed for nausea or vomiting. 30 tablet 0   No current facility-administered medications for this visit.     Vitals:   01/04/18 0842  BP: (!) 158/88  Pulse: 70  SpO2: 98%  Weight: 220 lb (99.8 kg)  Height: 6' (1.829 m)    PHYSICAL EXAM: BP (!) 158/88   Pulse 70   Ht 6' (1.829 m)   Wt 220 lb (99.8 kg)   SpO2 98%   BMI 29.84 kg/m   Affect appropriate Overweight black male  HEENT: normal Neck supple with no adenopathy JVP normal bilateral  bruits no thyromegaly Lungs clear with no wheezing and good diaphragmatic motion Heart:  S1/S2 SEM murmur, no rub, gallop or click PMI normal Abdomen: benighn, BS positve, no tenderness, no AAA no bruit.  No HSM or HJR Distal pulses intact with no bruits No edema Neuro non-focal Skin warm and dry No muscular weakness Left forearm AV loop fistula with thrill      ECG:  12/09/14  SR rate 93 LVH with chronic T wave changes inverted in lateral leads  ASSESSMENT & PLAN:  1. Combined Systolic Heart Failure: Low EF in the past, thought to be due to poorly controlled BP.  LHC in 2004 and 2008 with no significant CAD.  Echo 02/23/17 with EF back down to 25-30% Cannot take entresto/ARC/ARB due to stage 4 renal failure  2. CKD Stage 4  Followed by Dr Lorrene Reid . Not on ACEI.  Due to low GFR has an enlarging left renal mass by CT 05/03/17 that needs f/u Had fistula placed by Dr Donzetta Matters 11/25/17 with good thrill He is cleared to have general anesthesia For nephrectomy if needed for renal cancer   3. HTN: Well controlled.  Continue current medications and low sodium  Dash type diet.     4. Anemia:  Improved not on erythropoitin at this time 28.4 in ER 12/28/17  5. CVA: on plavix , bb, and statin  6. ED: stop viagra given need for nitrates to Rx low EF/CHF  7. Bruit:  F/u carotid duplex 10/2669 24-58% LICA ASA     Charles Hart 01/04/2018

## 2018-01-03 ENCOUNTER — Encounter: Payer: Self-pay | Admitting: *Deleted

## 2018-01-04 ENCOUNTER — Encounter: Payer: Self-pay | Admitting: Cardiovascular Disease

## 2018-01-04 ENCOUNTER — Ambulatory Visit (INDEPENDENT_AMBULATORY_CARE_PROVIDER_SITE_OTHER): Payer: 59 | Admitting: Cardiovascular Disease

## 2018-01-04 VITALS — BP 158/88 | HR 70 | Ht 72.0 in | Wt 220.0 lb

## 2018-01-04 DIAGNOSIS — I420 Dilated cardiomyopathy: Secondary | ICD-10-CM

## 2018-01-04 NOTE — Patient Instructions (Signed)

## 2018-01-19 DIAGNOSIS — L309 Dermatitis, unspecified: Secondary | ICD-10-CM | POA: Insufficient documentation

## 2018-01-19 DIAGNOSIS — I639 Cerebral infarction, unspecified: Secondary | ICD-10-CM

## 2018-01-19 DIAGNOSIS — I213 ST elevation (STEMI) myocardial infarction of unspecified site: Secondary | ICD-10-CM

## 2018-01-19 DIAGNOSIS — D509 Iron deficiency anemia, unspecified: Secondary | ICD-10-CM | POA: Insufficient documentation

## 2018-01-19 DIAGNOSIS — I129 Hypertensive chronic kidney disease with stage 1 through stage 4 chronic kidney disease, or unspecified chronic kidney disease: Secondary | ICD-10-CM | POA: Insufficient documentation

## 2018-01-19 DIAGNOSIS — E669 Obesity, unspecified: Secondary | ICD-10-CM | POA: Insufficient documentation

## 2018-01-19 DIAGNOSIS — N186 End stage renal disease: Secondary | ICD-10-CM | POA: Insufficient documentation

## 2018-01-19 DIAGNOSIS — D689 Coagulation defect, unspecified: Secondary | ICD-10-CM | POA: Insufficient documentation

## 2018-01-19 DIAGNOSIS — Z992 Dependence on renal dialysis: Secondary | ICD-10-CM | POA: Insufficient documentation

## 2018-01-19 DIAGNOSIS — G629 Polyneuropathy, unspecified: Secondary | ICD-10-CM | POA: Insufficient documentation

## 2018-01-19 HISTORY — DX: Cerebral infarction, unspecified: I63.9

## 2018-01-19 HISTORY — DX: ST elevation (STEMI) myocardial infarction of unspecified site: I21.3

## 2018-01-26 ENCOUNTER — Ambulatory Visit: Payer: PPO | Admitting: Gastroenterology

## 2018-01-30 ENCOUNTER — Telehealth: Payer: Self-pay

## 2018-01-30 NOTE — Telephone Encounter (Signed)
SENT REFERRAL TO SCHEDULING AND FILED NOTES 

## 2018-02-01 ENCOUNTER — Encounter: Payer: Self-pay | Admitting: *Deleted

## 2018-02-05 NOTE — Addendum Note (Signed)
Addended by: Hulan Fray on: 02/05/2018 05:34 PM   Modules accepted: Orders

## 2018-02-06 ENCOUNTER — Other Ambulatory Visit (HOSPITAL_COMMUNITY): Payer: Self-pay | Admitting: Nephrology

## 2018-02-06 ENCOUNTER — Telehealth: Payer: Self-pay | Admitting: Vascular Surgery

## 2018-02-06 DIAGNOSIS — Z7682 Awaiting organ transplant status: Secondary | ICD-10-CM

## 2018-02-06 NOTE — Telephone Encounter (Signed)
Shirlee Limerick, [name] from [facility], called about patient's back-to-work status after his surgical procedure on 11/24/17.   Is he able to lift 50 Lbs?    Per Dr. Donnetta Hutching, the patient is able to lift 50 Lbs.  Ashleigh E., LPN.

## 2018-02-07 ENCOUNTER — Ambulatory Visit (HOSPITAL_COMMUNITY): Payer: 59 | Attending: Cardiology

## 2018-02-07 ENCOUNTER — Other Ambulatory Visit: Payer: Self-pay

## 2018-02-07 DIAGNOSIS — E1122 Type 2 diabetes mellitus with diabetic chronic kidney disease: Secondary | ICD-10-CM | POA: Diagnosis not present

## 2018-02-07 DIAGNOSIS — Z8673 Personal history of transient ischemic attack (TIA), and cerebral infarction without residual deficits: Secondary | ICD-10-CM | POA: Diagnosis not present

## 2018-02-07 DIAGNOSIS — Z7682 Awaiting organ transplant status: Secondary | ICD-10-CM | POA: Insufficient documentation

## 2018-02-07 DIAGNOSIS — D631 Anemia in chronic kidney disease: Secondary | ICD-10-CM | POA: Insufficient documentation

## 2018-02-07 DIAGNOSIS — I509 Heart failure, unspecified: Secondary | ICD-10-CM | POA: Diagnosis not present

## 2018-02-07 DIAGNOSIS — G4733 Obstructive sleep apnea (adult) (pediatric): Secondary | ICD-10-CM | POA: Insufficient documentation

## 2018-02-07 DIAGNOSIS — I77810 Thoracic aortic ectasia: Secondary | ICD-10-CM | POA: Insufficient documentation

## 2018-02-07 DIAGNOSIS — N189 Chronic kidney disease, unspecified: Secondary | ICD-10-CM | POA: Insufficient documentation

## 2018-02-07 DIAGNOSIS — I130 Hypertensive heart and chronic kidney disease with heart failure and stage 1 through stage 4 chronic kidney disease, or unspecified chronic kidney disease: Secondary | ICD-10-CM | POA: Insufficient documentation

## 2018-02-13 NOTE — Progress Notes (Signed)
CARDIOLOGY OFFICE NOTE  Date:  02/14/2018    Charles Hart Date of Birth: [birth year] Medical Record #[id number]  PCP:  Charles Groves, DO  Cardiologist:  Charles Hart    Chief Complaint  Patient presents with  . Cardiomyopathy    Follow up after echo - seen for Charles Hart    History of Present Illness: Charles Hart is a 62 y.o. male who presents today for a work in visit at the request of Charles Hart. Seen for Charles Hart.   He has a history of poorly controlled HTN, prior CVA in 2013 - on chronic Plavix, HLD, normal coronaries by cath in 2004 and 2003, DM, CKD and prior reduced EF. Echo from 2014 showed recovery with EF of 55 to 60% however in 2018 - EF back down to 25 to 30%. No ACE due to allergy and CKD.   Last seen in May - going thru divorce. Noted concern for renal mass (on CT from 04/2017). Had had his fistula placed by [facility].  Note that he was cleared from cardiac standpoint if needed nephrectomy by Charles Hart.   Comes in today. Here alone. Had been sent back for echo - which has been completed - done in order to determine if EF was higher so that referral for possible kidney transplant could be arranged. He has been started on hemodialysis - has been on for a month or so - has had some issues with his BP. Got low just as of yesterday. No chest pain. Breathing is fine. He says "no one is telling me anything about the spot on my kidney". He tells me he has not been referred for evaluation/treatment. He does not wish to be on dialysis long term. He has seen urology over at [facility] in the past - does not know who he saw.   Past Medical History:  Diagnosis Date  . Adenomatous colon polyp 07/02/2011   Last colonoscopy May 06, 2011 by Dr. Owens Hart, who recommended repeat colonoscopy in 5 years.   . Anemia   . Background diabetic retinopathy 04/20/2012   Patient is followed by Dr. Katy Hart   . Cardiomyopathy    LV function improved from 2004 to 2008.  Historically,  moderately dilated LV with EF 30-40% by 2D echo 08/14/2002.  Mild CAD with severe LV dysfunction by cardiac cath 09/2002.  Normal coronary arteries and normal LV function by cardiac cath 09/19/2006.  A 2-D echo on 04/01/2009 showed mild concentric hypertrophy and normal systolic (LVEF  85-63%) and doppler C/W with grade 1 diastolic dysfunction.  . CHF (congestive heart failure) ([place])    LV function improved from 2004 to 2008.  Historically, moderately dilated LV with EF 30-40% by 2D echo 08/14/2002.  Mild CAD with severe LV dysfunction by cardiac cath 09/2002.  Normal coronary arteries and normal LV function by cardiac cath 09/19/2006.  A 2-D echo on 04/01/2009 showed mild concentric hypertrophy and normal systolic (LVEF  14-97%) and doppler C/W with grade 1 diastolic dysfunction..   . Chronic combined systolic and diastolic congestive heart failure ([place]) 05/21/2010   LV function improved from 2004 to 2008.  Historically, moderately dilated LV with EF 30-40% by 2D echo 08/14/2002.  Mild CAD with severe LV dysfunction by cardiac cath 09/2002.  Normal coronary arteries and normal LV function by cardiac cath 09/19/2006.  A 2-D echo on 04/01/2009 showed mild concentric hypertrophy and normal systolic (LVEF  02-63%) and doppler parameters consistent with abnormal left   .  CVA (cerebral vascular accident) ([place]) 07/04/2012   MRI of the brain 07/04/2012 showed an acute infarct in the right basal ganglia involving the anterior putamen, anterior limb internal capsule, and head of the caudate; this measured approximately 2.5 cm in diameter.     . Dermatitis   . Diabetes mellitus    type 2  . DIABETIC PERIPHERAL NEUROPATHY 08/03/2007   Qualifier: Diagnosis of  By: Charles Hart, Charles Hart    . DM neuropathy, painful ([place])   . Dyspnea   . ESRD (end stage renal disease) ([place])    Stage 4  . Hearing loss in right ear   . Hyperlipidemia   . Hypertension   . Hypertensive crisis 07/28/2012  . Hypertensive urgency 08/20/2014  .  Myocardial infarction ([place])   . Nephrotic syndrome 02/18/2013   A 24-hour urine collection 03/04/2013 showed total protein of 5,460 g and creatinine clearance of 80 mL/minute.  Patient was seen by Seward Meth at [facility] and a repeat 24-hour urine showed 10,407 mg protein.  Patient underwent kidney biopsy on 05/30/2013; pathology showed advanced diffuse and nodular diabetic nephropathy with vascular changes consistent with long-standing difficult to control hypertension.     . Pneumonia   . Renal insufficiency   . Sleep apnea    uses cpap    Past Surgical History:  Procedure Laterality Date  . AV FISTULA PLACEMENT Left 11/24/2017   Procedure: INSERTION OF ARTERIOVENOUS (AV) GORE-TEX GRAFT LEFT LOWER ARM;  Surgeon: Charles Posner, Hart;  Location: [facility];  Service: Vascular;  Laterality: Left;  . CARDIAC CATHETERIZATION     3 times  . COLONOSCOPY    . FOOT SURGERY    . POLYPECTOMY       Medications: Current Meds  Medication Sig  . calcium acetate, Phos Binder, (PHOSLYRA) 667 MG/5ML SOLN Take 1,334 mg by mouth 3 (three) times daily with meals.  [name] CARTIA XT 180 MG 24 hr capsule TAKE 1 CAPSULE(180 MG) BY MOUTH DAILY  . carvedilol (COREG) 25 MG tablet Take 25 mg by mouth 2 (two) times daily with a meal.   . clopidogrel (PLAVIX) 75 MG tablet Take 1 tablet (75 mg total) by mouth daily.  . fluticasone (FLONASE) 50 MCG/ACT nasal spray Place 2 sprays daily into both nostrils.  [name] gabapentin (NEURONTIN) 300 MG capsule Take 300 mg by mouth at bedtime.  . hydrALAZINE (APRESOLINE) 100 MG tablet Take 1 tablet (100 mg total) by mouth 3 (three) times daily.  . Insulin Pen Needle (NOVOFINE PLUS) 32G X 4 MM MISC 1 Dose by Does not apply route daily. For Victoza, substitution ok for cheaper brand  . liraglutide (VICTOZA) 18 MG/3ML SOPN Inject 0.1 mLs (0.6 mg total) into the skin daily.  . pravastatin (PRAVACHOL) 40 MG tablet Take 1 tablet (40 mg total) by mouth daily.  .  promethazine (PHENERGAN) 25 MG tablet Take 1 tablet (25 mg total) by mouth every 6 (six) hours as needed for nausea or vomiting.     Allergies: Allergies  Allergen Reactions  . Ivp Dye [Iodinated Diagnostic Agents] Other (See Comments)    Per patient's Nephrologist, he doesn't want the patient exposed to ANY dye because of issues with his kidneys  . Amlodipine Swelling  . Lisinopril Cough  . Red Dye Other (See Comments)    NO dye of any kind (issues with his kidneys)    Social History: The patient  reports that he has never smoked. He has never used smokeless tobacco.  He reports that he does not drink alcohol or use drugs.   Family History: The patient's family history includes Aneurysm (age of onset: 71) in his father; Colon cancer in his sister.   Review of Systems: Please see the history of present illness.   Otherwise, the review of systems is positive for none.   All other systems are reviewed and negative.   Physical Exam: VS:  BP 138/62 (BP Location: Right Arm, Patient Position: Sitting, Cuff Size: Normal)   Pulse 73   Ht 6' (1.829 m)   Wt 217 lb 12.8 oz (98.8 kg)   SpO2 98% Comment: at rest  BMI 29.54 kg/m  .  BMI Body mass index is 29.54 kg/m.  Wt Readings from Last 3 Encounters:  02/14/18 217 lb 12.8 oz (98.8 kg)  01/04/18 220 lb (99.8 kg)  12/28/17 223 lb (101.2 kg)    General: Alert. Looks older than his stated age. He is in no acute distress.   HEENT: Normal.  Neck: Supple, no JVD, carotid bruits, or masses noted.  Cardiac: Regular rate and rhythm. No murmurs, rubs, or gallops. No edema.  Respiratory:  Lungs are clear to auscultation bilaterally with normal work of breathing.  GI: Soft and nontender.  MS: No deformity or atrophy. Gait and ROM intact.  Skin: Warm and dry. Color is normal.  Neuro:  Strength and sensation are intact and no gross focal deficits noted.  Psych: Alert, appropriate and with normal affect.   LABORATORY DATA:  EKG:  EKG is not  ordered today.  Lab Results  Component Value Date   WBC 4.6 12/28/2017   HGB 9.3 (L) 12/28/2017   HCT 28.4 (L) 12/28/2017   PLT 183 12/28/2017   GLUCOSE 109 (H) 12/28/2017   CHOL 140 09/07/2017   TRIG 107 09/07/2017   HDL 34 (L) 09/07/2017   LDLDIRECT 80 07/18/2012   LDLCALC 85 09/07/2017   ALT 9 (L) 12/28/2017   AST 14 (L) 12/28/2017   NA 138 12/28/2017   K 3.4 (L) 12/28/2017   CL 104 12/28/2017   CREATININE 5.51 (H) 12/28/2017   BUN 65 (H) 12/28/2017   CO2 23 12/28/2017   TSH 2.782 07/04/2012   INR 1.14 11/09/2017   HGBA1C 5.4 12/07/2017   MICROALBUR 148.77 (H) 04/25/2014     BNP (last 3 results) Recent Labs    02/25/17 1632 12/28/17 0858  BNP 1,101.4* 2,664.0*    ProBNP (last 3 results) No results for input(s): PROBNP in the last 8760 hours.   Other Studies Reviewed Today:  Echo Study Conclusions 01/2018  - Left ventricle: The cavity size was mildly dilated. Wall   thickness was increased in a pattern of mild LVH. Systolic   function was moderately reduced. The estimated ejection fraction   was in the range of 35% to 40%. Diffuse hypokinesis. There was no   evidence of elevated ventricular filling pressure by Doppler   parameters. - Aorta: Aortic root dimension: 44 mm (ED). - Ascending aorta: The ascending aorta was mildly dilated. - Left atrium: The atrium was mildly dilated. - Right ventricle: The cavity size was moderately dilated. Wall   thickness was normal. - Right atrium: The atrium was mildly dilated.  Impressions:  - EF has mildly improved when compared to prior (25%).   Echo  02/23/17 Study Conclusions  - Left ventricle: The cavity size was severely dilated. Wall thickness was increased in a pattern of moderate LVH. Systolic function was severely reduced. The estimated ejection fraction  was in the range of 25% to 30%. Diffuse hypokinesis. Features are consistent with a pseudonormal left ventricular filling pattern, with  concomitant abnormal relaxation and increased filling pressure (grade 2 diastolic dysfunction). Doppler parameters are consistent with high ventricular filling pressure. - Aortic valve: There was trivial regurgitation. - Aortic root: The aortic root was mildly dilated. - Mitral valve: Calcified annulus. There was mild regurgitation. - Left atrium: The atrium was moderately dilated. - Right atrium: The atrium was moderately dilated.  Impressions:  - Severe global reduction in LV systolic function; severe LVE; moderate LVH; moderate diastolic dysfunction with elevated LV filling pressure; mildly dilated aortic root; trace AI; mild MR; biatrial enlargement.  Carotid done 08/17/13 94-58% LICA stenosis   Assessment/Plan:  1. Combined Systolic Heart Failure: Low EF in the past, thought to be due to poorly controlled BP.  LHC in 2004 and 2008 with no significant CAD.  Echo 02/23/17 with EF back down to 25-30%  He has previously not been on ACE/ARB/Entresto due to his renal disease - had ACE cough noted in the chart - most recent echo shows EF has mildly improved - now with EF of 35 to 40%. He denies symptoms of shortness of breath. Now on dialysis. Would hold on starting ARB/Entresto given BP lability - may consider in the future.   2. ESRD - has started hemodialysis - BP has been labile.   3. Enlarging left renal mass - worrisome for renal cell carcinoma - I have called over to [facility] - he has an appointment there later this month - he tells me he was not aware of this - but is quite grateful. Charles Hart has already given approval for nephrectomy if needed. Unclear to me, how this diagnosis of possible renal cell carcinoma will affect his eligibility for transplant.   4. HTN - BP ok here today - has had some lability with starting dialysis.    5. Anemia - most likely from CKD/ESRD.   6. Prior CVA - on chronic Plavix.   7. ED: not discussed   8. Carotid disease -  for repeat study later this summer.   Current medicines are reviewed with the patient today.  The patient does not have concerns regarding medicines other than what has been noted above.  The following changes have been made:  See above.  Labs/ tests ordered today include:   No orders of the defined types were placed in this encounter.    Disposition:   FU with Charles Hart as planned.  Patient is agreeable to this plan and will call if any problems develop in the interim.   SignedTruitt Merle, NP  02/14/2018 12:16 PM  [facility] [address] [place], [place]  [address] Phone: [phone] Fax: [phone]

## 2018-02-14 ENCOUNTER — Encounter: Payer: Self-pay | Admitting: Nurse Practitioner

## 2018-02-14 ENCOUNTER — Ambulatory Visit (INDEPENDENT_AMBULATORY_CARE_PROVIDER_SITE_OTHER): Payer: 59 | Admitting: Nurse Practitioner

## 2018-02-14 VITALS — BP 138/62 | HR 73 | Ht 72.0 in | Wt 217.8 lb

## 2018-02-14 DIAGNOSIS — N186 End stage renal disease: Secondary | ICD-10-CM | POA: Diagnosis not present

## 2018-02-14 DIAGNOSIS — I10 Essential (primary) hypertension: Secondary | ICD-10-CM | POA: Diagnosis not present

## 2018-02-14 DIAGNOSIS — I5022 Chronic systolic (congestive) heart failure: Secondary | ICD-10-CM

## 2018-02-14 DIAGNOSIS — N2889 Other specified disorders of kidney and ureter: Secondary | ICD-10-CM

## 2018-02-14 NOTE — Patient Instructions (Signed)
We will be checking the following labs today - NONE   Medication Instructions:    Continue with your current medicines.     Testing/Procedures To Be Arranged:  N/A  Follow-Up:   See Dr. [name] as planned  You have an appointment at [facility] on Wednesday July 24th at 9:45 AM - I would go there a little sooner to do paperwork    Other Special Instructions:   N/A    If you need a refill on your cardiac medications before your next appointment, please call your pharmacy.   Call the [facility] office at [phone] if you have any questions, problems or concerns.

## 2018-02-16 ENCOUNTER — Telehealth: Payer: Self-pay | Admitting: Cardiovascular Disease

## 2018-02-16 NOTE — Telephone Encounter (Signed)
New Message   Kieth Brightly from the [facility] is wanting to know if the pt can get an echo done so that he can get on a transplant list, informed of entering or sending an order and she wants to know if Dr. [name] can put it in. Please call

## 2018-02-16 NOTE — Telephone Encounter (Signed)
Charles Hart needs records of echo and last office visit note. Charles Hart is going to fax a release for records with attention to Medical Records. Will send message to Medical Records to keep a look out for fax.

## 2018-03-01 NOTE — Addendum Note (Signed)
Addended by: Hulan Fray on: 03/01/2018 04:04 PM   Modules accepted: Orders

## 2018-04-02 ENCOUNTER — Emergency Department (HOSPITAL_COMMUNITY): Payer: 59

## 2018-04-02 ENCOUNTER — Encounter (HOSPITAL_COMMUNITY): Payer: Self-pay | Admitting: Emergency Medicine

## 2018-04-02 ENCOUNTER — Emergency Department (HOSPITAL_COMMUNITY)
Admission: EM | Admit: 2018-04-02 | Discharge: 2018-04-02 | Disposition: A | Discharge status: Home / Self Care | Payer: 59 | Attending: Emergency Medicine | Admitting: Emergency Medicine

## 2018-04-02 ENCOUNTER — Other Ambulatory Visit: Payer: Self-pay

## 2018-04-02 DIAGNOSIS — Z79899 Other long term (current) drug therapy: Secondary | ICD-10-CM | POA: Diagnosis not present

## 2018-04-02 DIAGNOSIS — I130 Hypertensive heart and chronic kidney disease with heart failure and stage 1 through stage 4 chronic kidney disease, or unspecified chronic kidney disease: Secondary | ICD-10-CM | POA: Diagnosis not present

## 2018-04-02 DIAGNOSIS — Z7901 Long term (current) use of anticoagulants: Secondary | ICD-10-CM | POA: Diagnosis not present

## 2018-04-02 DIAGNOSIS — Z7984 Long term (current) use of oral hypoglycemic drugs: Secondary | ICD-10-CM | POA: Diagnosis not present

## 2018-04-02 DIAGNOSIS — E1122 Type 2 diabetes mellitus with diabetic chronic kidney disease: Secondary | ICD-10-CM | POA: Insufficient documentation

## 2018-04-02 DIAGNOSIS — N184 Chronic kidney disease, stage 4 (severe): Secondary | ICD-10-CM | POA: Insufficient documentation

## 2018-04-02 DIAGNOSIS — M1611 Unilateral primary osteoarthritis, right hip: Secondary | ICD-10-CM | POA: Diagnosis not present

## 2018-04-02 DIAGNOSIS — M25551 Pain in right hip: Secondary | ICD-10-CM | POA: Diagnosis present

## 2018-04-02 DIAGNOSIS — I5042 Chronic combined systolic (congestive) and diastolic (congestive) heart failure: Secondary | ICD-10-CM | POA: Insufficient documentation

## 2018-04-02 LAB — CBG MONITORING, ED: Glucose-Capillary: 84 mg/dL (ref 70–99)

## 2018-04-02 MED ORDER — HYDROCODONE-ACETAMINOPHEN 5-325 MG PO TABS: 1.0000 | ORAL_TABLET | Freq: Four times a day (QID) | ORAL | 0 refills | Status: DC | PRN

## 2018-04-02 NOTE — ED Provider Notes (Signed)
[facility] EMERGENCY DEPARTMENT Provider Note   CSN: [id number] Arrival date & time: 04/02/18  1208     History   Chief Complaint Chief Complaint  Patient presents with  . Hip Pain    HPI Charles Hart is a 62 y.o. male.  62 year old male presents to the emergency department with a complaint of right hip pain.  Patient states that he has had problems with his hip off and on for some time.  He says it is gotten worse recently.  The patient is a dialysis patient and he says it seems as though since he has been on dialysis this pain is gotten worse.  It has a pain as well as a cramping sensation to it.  He says that he has mentioned this to his dialysis nurses, but has not been examined by his dialysis physicians.  There is been no recent fall or injury.  No recent operation or procedure.  Patient presents now for assistance with this issue.  The history is provided by the patient.  Hip Pain  Pertinent negatives include no chest pain, no abdominal pain and no shortness of breath.    Past Medical History:  Diagnosis Date  . Adenomatous colon polyp 07/02/2011   Last colonoscopy May 06, 2011 by Dr. Owens Loffler, who recommended repeat colonoscopy in 5 years.   . Anemia   . Background diabetic retinopathy 04/20/2012   Patient is followed by Dr. Katy Fitch   . Cardiomyopathy    LV function improved from 2004 to 2008.  Historically, moderately dilated LV with EF 30-40% by 2D echo 08/14/2002.  Mild CAD with severe LV dysfunction by cardiac cath 09/2002.  Normal coronary arteries and normal LV function by cardiac cath 09/19/2006.  A 2-D echo on 04/01/2009 showed mild concentric hypertrophy and normal systolic (LVEF  32-67%) and doppler C/W with grade 1 diastolic dysfunction.  . CHF (congestive heart failure) ([place])    LV function improved from 2004 to 2008.  Historically, moderately dilated LV with EF 30-40% by 2D echo 08/14/2002.  Mild CAD with severe LV dysfunction by cardiac cath  09/2002.  Normal coronary arteries and normal LV function by cardiac cath 09/19/2006.  A 2-D echo on 04/01/2009 showed mild concentric hypertrophy and normal systolic (LVEF  12-45%) and doppler C/W with grade 1 diastolic dysfunction..   . Chronic combined systolic and diastolic congestive heart failure ([place]) 05/21/2010   LV function improved from 2004 to 2008.  Historically, moderately dilated LV with EF 30-40% by 2D echo 08/14/2002.  Mild CAD with severe LV dysfunction by cardiac cath 09/2002.  Normal coronary arteries and normal LV function by cardiac cath 09/19/2006.  A 2-D echo on 04/01/2009 showed mild concentric hypertrophy and normal systolic (LVEF  80-99%) and doppler parameters consistent with abnormal left   . CVA (cerebral vascular accident) ([place]) 07/04/2012   MRI of the brain 07/04/2012 showed an acute infarct in the right basal ganglia involving the anterior putamen, anterior limb internal capsule, and head of the caudate; this measured approximately 2.5 cm in diameter.     . Dermatitis   . Diabetes mellitus    type 2  . DIABETIC PERIPHERAL NEUROPATHY 08/03/2007   Qualifier: Diagnosis of  By: Marinda Elk MD, Sonia Side    . DM neuropathy, painful ([place])   . Dyspnea   . ESRD (end stage renal disease) ([place])    Stage 4  . Hearing loss in right ear   . Hyperlipidemia   . Hypertension   .  Hypertensive crisis 07/28/2012  . Hypertensive urgency 08/20/2014  . Myocardial infarction ([place])   . Nephrotic syndrome 02/18/2013   A 24-hour urine collection 03/04/2013 showed total protein of 5,460 g and creatinine clearance of 80 mL/minute.  Patient was seen by Seward Meth at [place] and a repeat 24-hour urine showed 10,407 mg protein.  Patient underwent kidney biopsy on 05/30/2013; pathology showed advanced diffuse and nodular diabetic nephropathy with vascular changes consistent with long-standing difficult to control hypertension.     . Pneumonia   . Renal insufficiency   . Sleep  apnea    uses cpap    Patient Active Problem List   Diagnosis Date Noted  . Aortic atherosclerosis ([place]) 12/07/2017  . CKD (chronic kidney disease) 11/24/2017  . S/P arteriovenous (AV) fistula creation 11/24/2017  . Dyspnea 11/08/2017  . Left renal mass 09/06/2017  . Acute on chronic combined systolic and diastolic CHF (congestive heart failure) ([place]) 02/25/2017  . Piriformis syndrome of right side 03/03/2016  . Muscle spasm of left shoulder area 03/03/2016  . Secondary hyperparathyroidism, renal ([place]) 02/09/2016  . Primary osteoarthritis of left knee 01/07/2016  . Low back pain 01/07/2016  . Joint stiffness 11/13/2015  . Age-related nuclear cataract of both eyes 10/07/2015  . Normocytic anemia 08/22/2014  . Health care maintenance 05/16/2014  . Nephrotic syndrome in diseases classified elsewhere 02/18/2013  . Obstructive sleep apnea 07/26/2012  . History of stroke 07/04/2012  . 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 ED (erectile dysfunction) 04/18/2012  . Benign neoplasm of colon 07/02/2011  . Chronic combined systolic and diastolic congestive heart failure ([place]) 05/21/2010  . Carotid bruit 04/01/2009  . Type 2 diabetes mellitus with diabetic neuropathy ([place]) 08/03/2007  . Hearing loss 07/30/2007  . Controlled type 2 diabetes mellitus with chronic kidney disease ([place]) 09/15/2006  . Other and unspecified hyperlipidemia 06/01/2006  . Essential (primary) hypertension 06/01/2006    Past Surgical History:  Procedure Laterality Date  . AV FISTULA PLACEMENT Left 11/24/2017   Procedure: INSERTION OF ARTERIOVENOUS (AV) GORE-TEX GRAFT LEFT LOWER ARM;  Surgeon: Rosetta Posner, MD;  Location: [facility];  Service: Vascular;  Laterality: Left;  . CARDIAC CATHETERIZATION     3 times  . COLONOSCOPY    . FOOT SURGERY    . POLYPECTOMY          Home Medications    Prior to Admission medications   Medication Sig Start Date End Date Taking?  Authorizing Provider  calcium acetate, Phos Binder, (PHOSLYRA) 667 MG/5ML SOLN Take 1,334 mg by mouth 3 (three) times daily with meals. 12/15/17   Tye Savoy, MD  CARTIA XT 180 MG 24 hr capsule TAKE 1 CAPSULE(180 MG) BY MOUTH DAILY 09/07/17   Lucious Groves, DO  carvedilol (COREG) 25 MG tablet Take 25 mg by mouth 2 (two) times daily with a meal.  09/06/17   [provider]  clopidogrel (PLAVIX) 75 MG tablet Take 1 tablet (75 mg total) by mouth daily. 09/07/17   Lucious Groves, DO  fluticasone (FLONASE) 50 MCG/ACT nasal spray Place 2 sprays daily into both nostrils. 07/01/17   Noemi Chapel, MD  gabapentin (NEURONTIN) 300 MG capsule Take 300 mg by mouth at bedtime.    [provider]  hydrALAZINE (APRESOLINE) 100 MG tablet Take 1 tablet (100 mg total) by mouth 3 (three) times daily. 09/07/17 09/16/18  Lucious Groves, DO  HYDROcodone-acetaminophen (NORCO/VICODIN) 5-325 MG tablet Take 1  tablet by mouth every 6 (six) hours as needed. 04/02/18   Lily Kocher, PA-C  Insulin Pen Needle (NOVOFINE PLUS) 32G X 4 MM MISC 1 Dose by Does not apply route daily. For Victoza, substitution ok for cheaper brand 09/08/17   Lucious Groves, DO  liraglutide (VICTOZA) 18 MG/3ML SOPN Inject 0.1 mLs (0.6 mg total) into the skin daily. 12/07/17   Lucious Groves, DO  pravastatin (PRAVACHOL) 40 MG tablet Take 1 tablet (40 mg total) by mouth daily. 09/07/17   Lucious Groves, DO  promethazine (PHENERGAN) 25 MG tablet Take 1 tablet (25 mg total) by mouth every 6 (six) hours as needed for nausea or vomiting. 12/07/17   Lucious Groves, DO    Family History Family History  Problem Relation Age of Onset  . Aneurysm Father 71       died of rupture  . Colon cancer Sister     Social History Social History   Tobacco Use  . Smoking status: Never Smoker  . Smokeless tobacco: Never Used  Substance Use Topics  . Alcohol use: No    Alcohol/week: 0.0 standard drinks  . Drug use: No     Allergies   Ivp dye  [iodinated diagnostic agents]; Amlodipine; Lisinopril; and Red dye   Review of Systems Review of Systems  Constitutional: Negative for activity change.       All ROS Neg except as noted in HPI  HENT: Negative for nosebleeds.   Eyes: Negative for photophobia and discharge.  Respiratory: Negative for cough, shortness of breath and wheezing.   Cardiovascular: Negative for chest pain and palpitations.  Gastrointestinal: Negative for abdominal pain and blood in stool.  Genitourinary: Negative for dysuria, frequency and hematuria.  Musculoskeletal: Positive for arthralgias. Negative for back pain and neck pain.  Skin: Negative.   Neurological: Negative for dizziness, seizures and speech difficulty.  Psychiatric/Behavioral: Negative for confusion and hallucinations.     Physical Exam Updated Vital Signs BP (!) 183/86 (BP Location: Right Arm)   Pulse 65   Temp 97.9 F (36.6 C) (Oral)   Resp 16   Ht 6' (1.829 m)   Wt 100.7 kg   SpO2 99%   BMI 30.11 kg/m   Physical Exam  Constitutional: He is oriented to person, place, and time. He appears well-developed and well-nourished.  Non-toxic appearance.  HENT:  Head: Normocephalic.  Right Ear: Tympanic membrane and external ear normal.  Left Ear: Tympanic membrane and external ear normal.  Eyes: Pupils are equal, round, and reactive to light. EOM and lids are normal.  Neck: Normal range of motion. Neck supple. Carotid bruit is not present.  Cardiovascular: Normal rate, regular rhythm, normal heart sounds, intact distal pulses and normal pulses.  Pulmonary/Chest: Breath sounds normal. No respiratory distress.  Abdominal: Soft. Bowel sounds are normal. There is no tenderness. There is no guarding.  Musculoskeletal:       Right hip: He exhibits decreased range of motion and tenderness.  Lymphadenopathy:       Head (right side): No submandibular adenopathy present.       Head (left side): No submandibular adenopathy present.    He has no  cervical adenopathy.  Neurological: He is alert and oriented to person, place, and time. He has normal strength. No cranial nerve deficit or sensory deficit.  Skin: Skin is warm and dry.  Psychiatric: He has a normal mood and affect. His speech is normal.  Nursing note and vitals reviewed.    ED  Treatments / Results  Labs (all labs ordered are listed, but only abnormal results are displayed) Labs Reviewed  CBG MONITORING, ED    EKG None  Radiology Dg Hip Unilat W Or Wo Pelvis 2-3 Views Right  Result Date: 04/02/2018 CLINICAL DATA:  Acute RIGHT hip pain for 1 week. No known injury. Initial encounter. EXAM: DG HIP (WITH OR WITHOUT PELVIS) 2-3V RIGHT COMPARISON:  05/03/2017 abdominopelvic CT FINDINGS: No acute fracture or dislocation. Moderate joint space narrowing and osteophytosis in both hips noted. No focal bony lesions are identified. IMPRESSION: 1. No acute abnormality 2. Moderate degenerative changes in both hips. Electronically Signed   By: Margarette Canada M.D.   On: 04/02/2018 13:08    Procedures Procedures (including critical care time)  Medications Ordered in ED Medications - No data to display   Initial Impression / Assessment and Plan / ED Course  I have reviewed the triage vital signs and the nursing notes.  Pertinent labs & imaging results that were available during my care of the patient were reviewed by me and considered in my medical decision making (see chart for details).       Final Clinical Impressions(s) / ED Diagnoses MDM  Vital signs are within normal limits with exception of the blood pressure being 183/86.  Pulse oximetry is 99% on room air.  The patient has pain with attempted range of motion of the right hip.  There is no dislocation appreciated.  X-ray of the right hip shows moderate joint space narrowing and osteophytosis of both hips.  I have discussed these findings with the patient in terms of which he understands.  Pain medication offered.   The patient states he is driving.  Prescription for hydrocodone 10 tablets given to the patient.  I have asked the patient to discuss this with his primary physician, and/or his dialysis physician team to help him with pain management.   Final diagnoses:  Primary osteoarthritis of right hip    ED Discharge Orders         Ordered    HYDROcodone-acetaminophen (NORCO/VICODIN) 5-325 MG tablet  Every 6 hours PRN     04/02/18 1352           Lily Kocher, PA-C 04/02/18 1403    Maudie Flakes, MD 04/02/18 [phone]

## 2018-04-02 NOTE — Discharge Instructions (Addendum)
Your tests reveal arthritis involving your right hip.  Please discuss your hip pain and cramping with your dialysis physician as well.  Use Tylenol extra strength for mild pain, use Norco for more severe pain.  Heating pad to the area may be helpful.

## 2018-04-02 NOTE — ED Triage Notes (Addendum)
Patient complaining of right hip pain radiating into right knee x 1 week. Denies injury. Patient ambulatory at triage. Denies pain at this time but states "it happens sometimes when I stand up or bend over."

## 2018-04-21 ENCOUNTER — Emergency Department (HOSPITAL_COMMUNITY)
Admission: EM | Admit: 2018-04-21 | Discharge: 2018-04-21 | Disposition: A | Discharge status: Home / Self Care | Payer: 59 | Attending: Emergency Medicine | Admitting: Emergency Medicine

## 2018-04-21 ENCOUNTER — Other Ambulatory Visit: Payer: Self-pay

## 2018-04-21 ENCOUNTER — Encounter (HOSPITAL_COMMUNITY): Payer: Self-pay | Admitting: Emergency Medicine

## 2018-04-21 DIAGNOSIS — Z7902 Long term (current) use of antithrombotics/antiplatelets: Secondary | ICD-10-CM | POA: Insufficient documentation

## 2018-04-21 DIAGNOSIS — I132 Hypertensive heart and chronic kidney disease with heart failure and with stage 5 chronic kidney disease, or end stage renal disease: Secondary | ICD-10-CM | POA: Diagnosis not present

## 2018-04-21 DIAGNOSIS — I252 Old myocardial infarction: Secondary | ICD-10-CM | POA: Insufficient documentation

## 2018-04-21 DIAGNOSIS — M25551 Pain in right hip: Secondary | ICD-10-CM | POA: Diagnosis present

## 2018-04-21 DIAGNOSIS — I5042 Chronic combined systolic (congestive) and diastolic (congestive) heart failure: Secondary | ICD-10-CM | POA: Diagnosis not present

## 2018-04-21 DIAGNOSIS — N186 End stage renal disease: Secondary | ICD-10-CM | POA: Insufficient documentation

## 2018-04-21 DIAGNOSIS — Z992 Dependence on renal dialysis: Secondary | ICD-10-CM | POA: Diagnosis not present

## 2018-04-21 DIAGNOSIS — M1611 Unilateral primary osteoarthritis, right hip: Secondary | ICD-10-CM | POA: Diagnosis not present

## 2018-04-21 DIAGNOSIS — Z8673 Personal history of transient ischemic attack (TIA), and cerebral infarction without residual deficits: Secondary | ICD-10-CM | POA: Insufficient documentation

## 2018-04-21 DIAGNOSIS — E119 Type 2 diabetes mellitus without complications: Secondary | ICD-10-CM | POA: Insufficient documentation

## 2018-04-21 DIAGNOSIS — Z79899 Other long term (current) drug therapy: Secondary | ICD-10-CM | POA: Diagnosis not present

## 2018-04-21 MED ORDER — HYDROCODONE-ACETAMINOPHEN 5-325 MG PO TABS: 1.0000 | ORAL_TABLET | ORAL | 0 refills | Status: DC | PRN

## 2018-04-21 NOTE — ED Triage Notes (Signed)
Pt here with c/o right hip pain that radiates to R knee. Was seen here for same last month. Pt states he was prescribed Neurontin and it helped for "awhile" but thinks his body has "gotten used to the dosage".

## 2018-04-21 NOTE — Discharge Instructions (Addendum)
As discussed, use the medicine prescribed for pain relief - this will make you drowsy - do not drive within 4 hours of taking this medicine.  You may also apply a heating pad to your hip and knee area for 10 minutes maximum at a very low setting.  Frequently heat will help lessen arthritis pain.  Call your doctor as needed if your symptoms persist.  Another treatment you may try is [place] pain relief patches (no prescription) apply to your site of maximum pain per the box instruction.  You are currently taking the maximum safe dose of gabapentin.

## 2018-04-21 NOTE — ED Provider Notes (Signed)
[facility] EMERGENCY DEPARTMENT Provider Note   CSN: [id number] Arrival date & time: 04/21/18  1950     History   Chief Complaint Chief Complaint  Patient presents with  . Leg Pain    HPI Charles Hart is a 62 y.o. male with a history as outlined below, significant for ESRD on hemodialysis, CHF, history of CVA, diabetes, MI and osteoarthritis, with recent x-ray of his right hip confirming moderate degenerative disease with complaints of worsening right hip and knee pain.  He describes pain that radiates between the 2 joints, states he has pain in both joints but worse in the right hip.  Pain is worsened with movement and better at rest.  He denies any new injuries and denies fevers, chills or swelling in either joint.  He was placed on gabapentin about 15 years ago for his arthritis pain which she states he is to be helpful, but believes he needs to be increased to higher dosing.  He currently takes 300 mg daily.  He has not discussed this concern with his PCP.  Additionally he has had no other treatments for his pain and has found no alleviators.  He denies weakness in the extremity, but endorses increased pain with weightbearing.  The history is provided by the patient.    Past Medical History:  Diagnosis Date  . Adenomatous colon polyp 07/02/2011   Last colonoscopy May 06, 2011 by Dr. Owens Loffler, who recommended repeat colonoscopy in 5 years.   . Anemia   . Background diabetic retinopathy 04/20/2012   Patient is followed by Dr. Katy Fitch   . Cardiomyopathy    LV function improved from 2004 to 2008.  Historically, moderately dilated LV with EF 30-40% by 2D echo 08/14/2002.  Mild CAD with severe LV dysfunction by cardiac cath 09/2002.  Normal coronary arteries and normal LV function by cardiac cath 09/19/2006.  A 2-D echo on 04/01/2009 showed mild concentric hypertrophy and normal systolic (LVEF  07-37%) and doppler C/W with grade 1 diastolic dysfunction.  . CHF (congestive heart  failure) ([place])    LV function improved from 2004 to 2008.  Historically, moderately dilated LV with EF 30-40% by 2D echo 08/14/2002.  Mild CAD with severe LV dysfunction by cardiac cath 09/2002.  Normal coronary arteries and normal LV function by cardiac cath 09/19/2006.  A 2-D echo on 04/01/2009 showed mild concentric hypertrophy and normal systolic (LVEF  10-62%) and doppler C/W with grade 1 diastolic dysfunction..   . Chronic combined systolic and diastolic congestive heart failure ([place]) 05/21/2010   LV function improved from 2004 to 2008.  Historically, moderately dilated LV with EF 30-40% by 2D echo 08/14/2002.  Mild CAD with severe LV dysfunction by cardiac cath 09/2002.  Normal coronary arteries and normal LV function by cardiac cath 09/19/2006.  A 2-D echo on 04/01/2009 showed mild concentric hypertrophy and normal systolic (LVEF  69-48%) and doppler parameters consistent with abnormal left   . CVA (cerebral vascular accident) ([place]) 07/04/2012   MRI of the brain 07/04/2012 showed an acute infarct in the right basal ganglia involving the anterior putamen, anterior limb internal capsule, and head of the caudate; this measured approximately 2.5 cm in diameter.     . Dermatitis   . Diabetes mellitus    type 2  . DIABETIC PERIPHERAL NEUROPATHY 08/03/2007   Qualifier: Diagnosis of  By: Marinda Elk MD, Sonia Side    . DM neuropathy, painful ([place])   . Dyspnea   . ESRD (end stage renal disease) (  [place])    Stage 4  . Hearing loss in right ear   . Hyperlipidemia   . Hypertension   . Hypertensive crisis 07/28/2012  . Hypertensive urgency 08/20/2014  . Myocardial infarction ([place])   . Nephrotic syndrome 02/18/2013   A 24-hour urine collection 03/04/2013 showed total protein of 5,460 g and creatinine clearance of 80 mL/minute.  Patient was seen by Seward Meth at [place] and a repeat 24-hour urine showed 10,407 mg protein.  Patient underwent kidney biopsy on 05/30/2013; pathology showed  advanced diffuse and nodular diabetic nephropathy with vascular changes consistent with long-standing difficult to control hypertension.     . Pneumonia   . Renal insufficiency   . Sleep apnea    uses cpap    Patient Active Problem List   Diagnosis Date Noted  . Aortic atherosclerosis ([place]) 12/07/2017  . CKD (chronic kidney disease) 11/24/2017  . S/P arteriovenous (AV) fistula creation 11/24/2017  . Dyspnea 11/08/2017  . Left renal mass 09/06/2017  . Acute on chronic combined systolic and diastolic CHF (congestive heart failure) ([place]) 02/25/2017  . Piriformis syndrome of right side 03/03/2016  . Muscle spasm of left shoulder area 03/03/2016  . Secondary hyperparathyroidism, renal ([place]) 02/09/2016  . Primary osteoarthritis of left knee 01/07/2016  . Low back pain 01/07/2016  . Joint stiffness 11/13/2015  . Age-related nuclear cataract of both eyes 10/07/2015  . Normocytic anemia 08/22/2014  . Health care maintenance 05/16/2014  . Nephrotic syndrome in diseases classified elsewhere 02/18/2013  . Obstructive sleep apnea 07/26/2012  . History of stroke 07/04/2012  . 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 ED (erectile dysfunction) 04/18/2012  . Benign neoplasm of colon 07/02/2011  . Chronic combined systolic and diastolic congestive heart failure ([place]) 05/21/2010  . Carotid bruit 04/01/2009  . Type 2 diabetes mellitus with diabetic neuropathy ([place]) 08/03/2007  . Hearing loss 07/30/2007  . Controlled type 2 diabetes mellitus with chronic kidney disease ([place]) 09/15/2006  . Other and unspecified hyperlipidemia 06/01/2006  . Essential (primary) hypertension 06/01/2006    Past Surgical History:  Procedure Laterality Date  . AV FISTULA PLACEMENT Left 11/24/2017   Procedure: INSERTION OF ARTERIOVENOUS (AV) GORE-TEX GRAFT LEFT LOWER ARM;  Surgeon: Rosetta Posner, MD;  Location: [place];  Service: Vascular;  Laterality: Left;  . CARDIAC  CATHETERIZATION     3 times  . COLONOSCOPY    . FOOT SURGERY    . POLYPECTOMY          Home Medications    Prior to Admission medications   Medication Sig Start Date End Date Taking? Authorizing Provider  calcium acetate, Phos Binder, (PHOSLYRA) 667 MG/5ML SOLN Take 1,334 mg by mouth 3 (three) times daily with meals. 12/15/17   Tye Savoy, MD  CARTIA XT 180 MG 24 hr capsule TAKE 1 CAPSULE(180 MG) BY MOUTH DAILY 09/07/17   Lucious Groves, DO  carvedilol (COREG) 25 MG tablet Take 25 mg by mouth 2 (two) times daily with a meal.  09/06/17   [provider]  clopidogrel (PLAVIX) 75 MG tablet Take 1 tablet (75 mg total) by mouth daily. 09/07/17   Lucious Groves, DO  fluticasone (FLONASE) 50 MCG/ACT nasal spray Place 2 sprays daily into both nostrils. 07/01/17   Noemi Chapel, MD  gabapentin (NEURONTIN) 300 MG capsule Take 300 mg by mouth at bedtime.    [provider]  hydrALAZINE (APRESOLINE) 100 MG tablet Take 1 tablet (  100 mg total) by mouth 3 (three) times daily. 09/07/17 09/16/18  Lucious Groves, DO  HYDROcodone-acetaminophen (NORCO/VICODIN) 5-325 MG tablet Take 1 tablet by mouth every 4 (four) hours as needed. 04/21/18   Evalee Jefferson, PA-C  HYDROcodone-acetaminophen (NORCO/VICODIN) 5-325 MG tablet Take 1 tablet by mouth every 4 (four) hours as needed. 04/21/18   Evalee Jefferson, PA-C  Insulin Pen Needle (NOVOFINE PLUS) 32G X 4 MM MISC 1 Dose by Does not apply route daily. For Victoza, substitution ok for cheaper brand 09/08/17   Lucious Groves, DO  liraglutide (VICTOZA) 18 MG/3ML SOPN Inject 0.1 mLs (0.6 mg total) into the skin daily. 12/07/17   Lucious Groves, DO  pravastatin (PRAVACHOL) 40 MG tablet Take 1 tablet (40 mg total) by mouth daily. 09/07/17   Lucious Groves, DO  promethazine (PHENERGAN) 25 MG tablet Take 1 tablet (25 mg total) by mouth every 6 (six) hours as needed for nausea or vomiting. 12/07/17   Lucious Groves, DO    Family History Family History  Problem  Relation Age of Onset  . Aneurysm Father 89       died of rupture  . Colon cancer Sister     Social History Social History   Tobacco Use  . Smoking status: Never Smoker  . Smokeless tobacco: Never Used  Substance Use Topics  . Alcohol use: No    Alcohol/week: 0.0 standard drinks  . Drug use: No     Allergies   Ivp dye [iodinated diagnostic agents]; Amlodipine; Lisinopril; and Red dye   Review of Systems Review of Systems  Constitutional: Negative for fever.  Musculoskeletal: Positive for arthralgias. Negative for joint swelling and myalgias.  Neurological: Negative for weakness and numbness.     Physical Exam Updated Vital Signs BP (!) 169/87 (BP Location: Right Arm)   Pulse 70   Temp 98.4 F (36.9 C) (Oral)   Resp 16   Ht 6' (1.829 m)   Wt 99.8 kg   SpO2 100%   BMI 29.84 kg/m   Physical Exam  Constitutional: He appears well-developed and well-nourished.  HENT:  Head: Atraumatic.  Neck: Normal range of motion.  Cardiovascular:  Pulses:      Dorsalis pedis pulses are 2+ on the right side, and 2+ on the left side.  Pulses equal bilaterally  Musculoskeletal: He exhibits tenderness. He exhibits no edema or deformity.       Right knee: He exhibits no swelling, no effusion, no erythema, no LCL laxity, normal patellar mobility and no MCL laxity. Tenderness found. Lateral joint line tenderness noted.       Legs: ttp right lateral hip along greater trochanter.  No edema, no erythema.  No increased pain with internal/ext right hip rotation. There is pain with abduction laterally.    Neurological: He is alert. He has normal strength. He displays normal reflexes. No sensory deficit.  Skin: Skin is warm and dry.  Psychiatric: He has a normal mood and affect.     ED Treatments / Results  Labs (all labs ordered are listed, but only abnormal results are displayed) Labs Reviewed - No data to display  EKG None  Radiology No results  found.  Procedures Procedures (including critical care time)  Medications Ordered in ED Medications - No data to display   Initial Impression / Assessment and Plan / ED Course  I have reviewed the triage vital signs and the nursing notes.  Pertinent labs & imaging results that were available during my  care of the patient were reviewed by me and considered in my medical decision making (see chart for details).     Imaging of right hip from visit here 8/19 reviewed and shown pt revealing moderate djd of the joint.  No indication on todays exam or repeat imaging.  He was prescribed hydrocodone. He is at the max safe dose of gabapentin given renal fxn - pt was advised of this.  He was advised f/u with pcp and also referred to ortho for further management of his sx. Discussed heat tx.  No sx suggesting septic joint. No new injuries.   Final Clinical Impressions(s) / ED Diagnoses   Final diagnoses:  Osteoarthritis of right hip, unspecified osteoarthritis type    ED Discharge Orders         Ordered    HYDROcodone-acetaminophen (NORCO/VICODIN) 5-325 MG tablet  Every 4 hours PRN     04/21/18 2151    HYDROcodone-acetaminophen (NORCO/VICODIN) 5-325 MG tablet  Every 4 hours PRN     04/21/18 2157           Evalee Jefferson, PA-C 04/22/18 0052    Davonna Belling, MD 04/22/18 [phone]

## 2018-04-23 MED FILL — Hydrocodone-Acetaminophen Tab 5-325 MG: ORAL | Qty: 6 | Status: AC

## 2018-04-25 ENCOUNTER — Other Ambulatory Visit: Payer: Self-pay

## 2018-04-25 ENCOUNTER — Ambulatory Visit (INDEPENDENT_AMBULATORY_CARE_PROVIDER_SITE_OTHER): Payer: 59 | Admitting: Internal Medicine

## 2018-04-25 VITALS — BP 177/78 | HR 75 | Temp 98.1°F | Wt 216.1 lb

## 2018-04-25 DIAGNOSIS — M7061 Trochanteric bursitis, right hip: Secondary | ICD-10-CM | POA: Insufficient documentation

## 2018-04-25 DIAGNOSIS — I120 Hypertensive chronic kidney disease with stage 5 chronic kidney disease or end stage renal disease: Secondary | ICD-10-CM | POA: Diagnosis not present

## 2018-04-25 DIAGNOSIS — M1611 Unilateral primary osteoarthritis, right hip: Secondary | ICD-10-CM

## 2018-04-25 DIAGNOSIS — N186 End stage renal disease: Secondary | ICD-10-CM | POA: Diagnosis not present

## 2018-04-25 DIAGNOSIS — Z79891 Long term (current) use of opiate analgesic: Secondary | ICD-10-CM

## 2018-04-25 DIAGNOSIS — Z992 Dependence on renal dialysis: Secondary | ICD-10-CM

## 2018-04-25 DIAGNOSIS — I10 Essential (primary) hypertension: Secondary | ICD-10-CM

## 2018-04-25 DIAGNOSIS — Z79899 Other long term (current) drug therapy: Secondary | ICD-10-CM

## 2018-04-25 HISTORY — DX: Trochanteric bursitis, right hip: M70.61

## 2018-04-25 MED ORDER — TRAMADOL HCL 50 MG PO TABS: 50.0000 mg | ORAL_TABLET | Freq: Two times a day (BID) | ORAL | 0 refills | Status: DC | PRN

## 2018-04-25 MED ORDER — LIDOCAINE 5 % EX PTCH: 1.0000 | MEDICATED_PATCH | CUTANEOUS | 0 refills | Status: DC

## 2018-04-25 NOTE — Patient Instructions (Addendum)
Thank you for visiting clinic today. I am sorry that you are hurting, it looks like trochanteric bursitis.  As we discussed the best treatment is to put a steroid injection in that area. Please follow-up next Wednesday morning when Dr. Heber  is here to perform this procedure. Meanwhile I am giving you a pain killer called tramadol, you can take it twice a day. I am also giving you a prescription for lidocaine patch you can apply daily. If your insurance does not pay for that patch you can try over-the-counter Aspercreme or Medi patch as they also have quite similar ingredient and should help with your pain. Please follow-up next Wednesday morning.  Trochanteric Bursitis Trochanteric bursitis is a condition that causes hip pain. Trochanteric bursitis happens when fluid-filled sacs (bursae) in the hip get irritated. Normally these sacs absorb shock and help strong bands of tissue (tendons) in your hip glide smoothly over each other and over your hip bones. What are the causes? This condition results from increased friction between the hip bones and the tendons that go over them. This condition can happen if you:  Have weak hips.  Use your hip muscles too much (overuse).  Get hit in the hip.  What increases the risk? This condition is more likely to develop in:  Women.  Adults who are middle-aged or older.  People with arthritis or a spinal condition.  People with weak buttocks muscles (gluteal muscles).  People who have one leg that is shorter than the other.  People who participate in certain kinds of athletic activities, such as: ? Running sports, especially long-distance running. ? Contact sports, like football or martial arts. ? Sports in which falls may occur, like skiing.  What are the signs or symptoms? The main symptom of this condition is pain and tenderness over the point of your hip. The pain may be:  Sharp and intense.  Dull and achy.  Felt on the outside of  your thigh.  It may increase when you:  Lie on your side.  Walk or run.  Go up on stairs.  Sit.  Stand up after sitting.  Stand for long periods of time.  How is this diagnosed? This condition may be diagnosed based on:  Your symptoms.  Your medical history.  A physical exam.  Imaging tests, such as: ? X-rays to check your bones. ? An MRI or ultrasound to check your tendons and muscles.  During your physical exam, your health care provider will check the movement and strength of your hip. He or she may press on the point of your hip to check for pain. How is this treated? This condition may be treated by:  Resting.  Reducing your activity.  Avoiding activities that cause pain.  Using crutches, a cane, or a walker to decrease the strain on your hip.  Taking medicine to help with swelling.  Having medicine injected into the bursae to help with swelling.  Using ice, heat, and massage therapy for pain relief.  Physical therapy exercises for strength and flexibility.  Surgery (rare).  Follow these instructions at home: Activity  Rest.  Avoid activities that cause pain.  Return to your normal activities as told by your health care provider. Ask your health care provider what activities are safe for you. Managing pain, stiffness, and swelling  Take over-the-counter and prescription medicines only as told by your health care provider.  If directed, apply heat to the injured area as told by your health care provider. ?  Place a towel between your skin and the heat source. ? Leave the heat on for 20-30 minutes. ? Remove the heat if your skin turns bright red. This is especially important if you are unable to feel pain, heat, or cold. You may have a greater risk of getting burned.  If directed, apply ice to the injured area: ? Put ice in a plastic bag. ? Place a towel between your skin and the bag. ? Leave the ice on for 20 minutes, 2-3 times a day. General  instructions  If the affected leg is one that you use for driving, ask your health care provider when it is safe to drive.  Use crutches, a cane, or a walker as told by your health care provider.  If one of your legs is shorter than the other, get fitted for a shoe insert.  Lose weight if you are overweight. How is this prevented?  Wear supportive footwear that is appropriate for your sport.  If you have hip pain, start any new exercise or sport slowly.  Maintain physical fitness, including: ? Strength. ? Flexibility. Contact a health care provider if:  Your pain does not improve with 2-4 weeks. Get help right away if:  You develop severe pain.  You have a fever.  You develop increased redness over your hip.  You have a change in your bowel function or bladder function.  You cannot control the muscles in your feet. This information is not intended to replace advice given to you by your health care provider. Make sure you discuss any questions you have with your health care provider. Document Released: 09/08/2004 Document Revised: 04/06/2016 Document Reviewed: 07/17/2015 Elsevier Interactive Patient Education  [employer].

## 2018-04-25 NOTE — Progress Notes (Signed)
CC: Right hip pain.  HPI:  [name] is a 62 y.o. with past medical history as listed below came to the clinic with complaint of worsening right hip pain for the past 1 month.  Patient was experiencing right lateral hip pain radiating upto the right lateral knee, he was seen in ED twice during the last month for similar complaints, right hip x-ray obtained during those visits shows moderate degenerative disease of the right hip.  Pain get worse with walking and lying on right side.  Improved once he is off from that hip.  He was given Norco from the ED which do help temporarily.  He denies any swelling or redness around that area. He denies any tingling or numbness, no focal weakness. He denies any back pain. He denies any pain in his groin area. He denies any urinary or fecal incontinence. He denies any urinary symptoms. Denies any fever or chills.  Please see assessment and plan for his chronic conditions.  Past Medical History:  Diagnosis Date  . Adenomatous colon polyp 07/02/2011   Last colonoscopy May 06, 2011 by Dr. Owens Loffler, who recommended repeat colonoscopy in 5 years.   . Anemia   . Background diabetic retinopathy 04/20/2012   Patient is followed by Dr. Katy Fitch   . Cardiomyopathy    LV function improved from 2004 to 2008.  Historically, moderately dilated LV with EF 30-40% by 2D echo 08/14/2002.  Mild CAD with severe LV dysfunction by cardiac cath 09/2002.  Normal coronary arteries and normal LV function by cardiac cath 09/19/2006.  A 2-D echo on 04/01/2009 showed mild concentric hypertrophy and normal systolic (LVEF  40-34%) and doppler C/W with grade 1 diastolic dysfunction.  . CHF (congestive heart failure) ([place])    LV function improved from 2004 to 2008.  Historically, moderately dilated LV with EF 30-40% by 2D echo 08/14/2002.  Mild CAD with severe LV dysfunction by cardiac cath 09/2002.  Normal coronary arteries and normal LV function by cardiac cath  09/19/2006.  A 2-D echo on 04/01/2009 showed mild concentric hypertrophy and normal systolic (LVEF  74-25%) and doppler C/W with grade 1 diastolic dysfunction..   . Chronic combined systolic and diastolic congestive heart failure ([place]) 05/21/2010   LV function improved from 2004 to 2008.  Historically, moderately dilated LV with EF 30-40% by 2D echo 08/14/2002.  Mild CAD with severe LV dysfunction by cardiac cath 09/2002.  Normal coronary arteries and normal LV function by cardiac cath 09/19/2006.  A 2-D echo on 04/01/2009 showed mild concentric hypertrophy and normal systolic (LVEF  95-63%) and doppler parameters consistent with abnormal left   . CVA (cerebral vascular accident) ([place]) 07/04/2012   MRI of the brain 07/04/2012 showed an acute infarct in the right basal ganglia involving the anterior putamen, anterior limb internal capsule, and head of the caudate; this measured approximately 2.5 cm in diameter.     . Dermatitis   . Diabetes mellitus    type 2  . DIABETIC PERIPHERAL NEUROPATHY 08/03/2007   Qualifier: Diagnosis of  By: Marinda Elk MD, Sonia Side    . DM neuropathy, painful ([place])   . Dyspnea   . ESRD (end stage renal disease) ([place])    Stage 4  . Hearing loss in right ear   . Hyperlipidemia   . Hypertension   . Hypertensive crisis 07/28/2012  . Hypertensive urgency 08/20/2014  . Myocardial infarction ([place])   . Nephrotic syndrome 02/18/2013   A 24-hour urine collection 03/04/2013 showed total  protein of 5,460 g and creatinine clearance of 80 mL/minute.  Patient was seen by Seward Meth at [place] and a repeat 24-hour urine showed 10,407 mg protein.  Patient underwent kidney biopsy on 05/30/2013; pathology showed advanced diffuse and nodular diabetic nephropathy with vascular changes consistent with long-standing difficult to control hypertension.     . Pneumonia   . Renal insufficiency   . Sleep apnea    uses cpap   Review of Systems: Negative except mentioned in  HPI.  Physical Exam:  Vitals:   04/25/18 1041  BP: (!) 177/78  Pulse: 75  Temp: 98.1 F (36.7 C)  TempSrc: Oral  SpO2: 100%  Weight: 216 lb 1.6 oz (98 kg)    General: Vital signs reviewed.  Patient is well-developed and well-nourished, in no acute distress and cooperative with exam.  Head: Normocephalic and atraumatic. Cardiovascular: RRR, S1 normal, S2 normal, no murmurs, gallops, or rubs. Pulmonary/Chest: Clear to auscultation bilaterally, no wheezes, rales, or rhonchi. Abdominal: Soft, non-tender, non-distended, BS +, Musculoskeletal: Marked tenderness along trochanteric tubercle, restricted range of motion around right hip due to pain, sensations and strength intact. Extremities: No lower extremity edema bilaterally,  pulses symmetric and intact bilaterally. No cyanosis or clubbing. Skin: Warm, dry and intact. No rashes or erythema. Psychiatric: Normal mood and affect. speech and behavior is normal. Cognition and memory are normal.  Assessment & Plan:   See Encounters Tab for problem based charting.  Patient discussed with Dr. Dareen Piano.

## 2018-04-25 NOTE — Assessment & Plan Note (Signed)
His symptoms and exam was more consistent with trochanteric bursitis. He will get benefit with a local steroid injection.  -He was given a prescription of tramadol because of his kidney disease. -He was also given a prescription of lidocaine patch-did give him information about over-the-counter lidocaine patch in case his insurance denied. -Asked the patient to make an appointment for next Wednesday morning when Dr. Heber [place] will be in the clinic to help with steroid injection.

## 2018-04-25 NOTE — Assessment & Plan Note (Signed)
BP Readings from Last 3 Encounters:  04/25/18 (!) 177/78  04/21/18 (!) 169/87  04/02/18 (!) 183/86   His blood pressure was elevated today. Patient did not took his antihypertensives for the past 2 days, stating that he normally does not take any medicine on the day of dialysis and today he was rushing to come to the clinic.  Advised to continue taking his medications regularly. Take his medications after dialysis if his blood pressure remained elevated at the end of dialysis. Reevaluate during next follow-up visit.

## 2018-04-27 NOTE — Progress Notes (Signed)
Internal Medicine Clinic Attending  Case discussed with Dr. Amin at the time of the visit.  We reviewed the resident's history and exam and pertinent patient test results.  I agree with the assessment, diagnosis, and plan of care documented in the resident's note.    

## 2018-05-02 ENCOUNTER — Ambulatory Visit (INDEPENDENT_AMBULATORY_CARE_PROVIDER_SITE_OTHER): Payer: 59 | Admitting: Internal Medicine

## 2018-05-02 ENCOUNTER — Other Ambulatory Visit: Payer: Self-pay

## 2018-05-02 DIAGNOSIS — R252 Cramp and spasm: Secondary | ICD-10-CM

## 2018-05-02 DIAGNOSIS — Z992 Dependence on renal dialysis: Secondary | ICD-10-CM

## 2018-05-02 DIAGNOSIS — I10 Essential (primary) hypertension: Secondary | ICD-10-CM

## 2018-05-02 DIAGNOSIS — Z79899 Other long term (current) drug therapy: Secondary | ICD-10-CM

## 2018-05-02 DIAGNOSIS — M7061 Trochanteric bursitis, right hip: Secondary | ICD-10-CM | POA: Diagnosis not present

## 2018-05-02 MED ORDER — TRAMADOL HCL 50 MG PO TABS: 50.0000 mg | ORAL_TABLET | Freq: Two times a day (BID) | ORAL | 0 refills | Status: DC | PRN

## 2018-05-02 NOTE — Assessment & Plan Note (Signed)
BP Readings from Last 3 Encounters:  05/02/18 140/75  04/25/18 (!) 177/78  04/21/18 (!) 169/87   Blood pressure was within goal.  Continue with current management.

## 2018-05-02 NOTE — Assessment & Plan Note (Signed)
Patient was offered a steroid injection for his right trochanteric bursitis which will help him with quicker relief. Wants to try conservative measures for few weeks before getting an injection. This type of symptoms normally resolved in few weeks.  We will try conservative measures for few more weeks and if his symptoms continue to bother him or get worse he can come back for a steroid injection.

## 2018-05-02 NOTE — Patient Instructions (Signed)
Thank you for visiting clinic today. As we discussed that you have aggravation of 1 of the bursa in your right leg call trochanteric bursitis.  It is a sheet of muscles which attaches to your bone. It can go away in the next few weeks on its own. If you continue to experience worsening symptoms, we can give you a injection in that area which can cause quick relief.  Let us know when you are ready for injection if needed. Giving you another prescription for tramadol you can take it as needed. Follow-up with your PCP according to your scheduled appointment. You can follow-up in acute care clinic if your symptoms get worse and you want to get injection, please let us know before hand so we will give you an appointment according to the availability of our providers who can inject.

## 2018-05-02 NOTE — Progress Notes (Signed)
CC: For follow-up of his trochanteric bursitis. HPI:  [name] is a 62 y.o. with past medical history as listed below came to the clinic for follow-up of his trochanteric bursitis.  Patient continued to experience right lateral hip pain.  He was seen in clinic last week and diagnosed with trochanteric bursitis and was asked to come back today for steroid injection. Patient is not ready for a steroid injection and wants to give conservative measures some time and see if his symptoms improves.  He continued to experience lateral hip pain radiating to lateral knee.  Denies any tingling or numbness or focal weakness.  Patient was also concerned that he was getting some cramps during dialysis.  He was worried that he might be having too much fluid pulled out.  He was reassured that this is common with dialysis due to fluid and electrolyte shift.  His symptoms should improve as he continue with dialysis.  Past Medical History:  Diagnosis Date  . Adenomatous colon polyp 07/02/2011   Last colonoscopy May 06, 2011 by Dr. Owens Loffler, who recommended repeat colonoscopy in 5 years.   . Anemia   . Background diabetic retinopathy 04/20/2012   Patient is followed by Dr. Katy Fitch   . Cardiomyopathy    LV function improved from 2004 to 2008.  Historically, moderately dilated LV with EF 30-40% by 2D echo 08/14/2002.  Mild CAD with severe LV dysfunction by cardiac cath 09/2002.  Normal coronary arteries and normal LV function by cardiac cath 09/19/2006.  A 2-D echo on 04/01/2009 showed mild concentric hypertrophy and normal systolic (LVEF  37-10%) and doppler C/W with grade 1 diastolic dysfunction.  . CHF (congestive heart failure) ([place])    LV function improved from 2004 to 2008.  Historically, moderately dilated LV with EF 30-40% by 2D echo 08/14/2002.  Mild CAD with severe LV dysfunction by cardiac cath 09/2002.  Normal coronary arteries and normal LV function by cardiac cath 09/19/2006.  A 2-D  echo on 04/01/2009 showed mild concentric hypertrophy and normal systolic (LVEF  62-69%) and doppler C/W with grade 1 diastolic dysfunction..   . Chronic combined systolic and diastolic congestive heart failure ([place]) 05/21/2010   LV function improved from 2004 to 2008.  Historically, moderately dilated LV with EF 30-40% by 2D echo 08/14/2002.  Mild CAD with severe LV dysfunction by cardiac cath 09/2002.  Normal coronary arteries and normal LV function by cardiac cath 09/19/2006.  A 2-D echo on 04/01/2009 showed mild concentric hypertrophy and normal systolic (LVEF  48-54%) and doppler parameters consistent with abnormal left   . CVA (cerebral vascular accident) ([place]) 07/04/2012   MRI of the brain 07/04/2012 showed an acute infarct in the right basal ganglia involving the anterior putamen, anterior limb internal capsule, and head of the caudate; this measured approximately 2.5 cm in diameter.     . Dermatitis   . Diabetes mellitus    type 2  . DIABETIC PERIPHERAL NEUROPATHY 08/03/2007   Qualifier: Diagnosis of  By: Marinda Elk MD, Sonia Side    . DM neuropathy, painful ([place])   . Dyspnea   . ESRD (end stage renal disease) ([place])    Stage 4  . Hearing loss in right ear   . Hyperlipidemia   . Hypertension   . Hypertensive crisis 07/28/2012  . Hypertensive urgency 08/20/2014  . Myocardial infarction ([place])   . Nephrotic syndrome 02/18/2013   A 24-hour urine collection 03/04/2013 showed total protein of 5,460 g and creatinine clearance of 80  mL/minute.  Patient was seen by Seward Meth at [place] and a repeat 24-hour urine showed 10,407 mg protein.  Patient underwent kidney biopsy on 05/30/2013; pathology showed advanced diffuse and nodular diabetic nephropathy with vascular changes consistent with long-standing difficult to control hypertension.     . Pneumonia   . Renal insufficiency   . Sleep apnea    uses cpap   Review of Systems: Negative except mentioned in HPI.  Physical  Exam:  Vitals:   05/02/18 0942  BP: 140/75  Pulse: 66  Temp: 98.5 F (36.9 C)  TempSrc: Oral  SpO2: 100%  Weight: 207 lb 6.4 oz (94.1 kg)  Height: 6' (1.829 m)   General: Vital signs reviewed.  Patient is well-developed and well-nourished, in no acute distress and cooperative with exam.  Head: Normocephalic and atraumatic. Cardiovascular: RRR, S1 normal, S2 normal, no murmurs, gallops, or rubs. Pulmonary/Chest: Clear to auscultation bilaterally, no wheezes, rales, or rhonchi. Abdominal: Soft, non-tender, non-distended, BS +ve. Musculoskeletal: Tenderness along trochanteric tubercle, restricted range of motion at right hip due to pain. Extremities: No lower extremity edema bilaterally,  pulses symmetric and intact bilaterally. No cyanosis or clubbing. Skin: Warm, dry and intact. No rashes or erythema. Psychiatric: Normal mood and affect. speech and behavior is normal. Cognition and memory are normal.  Assessment & Plan:   See Encounters Tab for problem based charting.  Patient seen with Dr. Angelia Mould.

## 2018-05-04 NOTE — Progress Notes (Signed)
Internal Medicine Clinic Attending  I saw and evaluated the patient.  I personally confirmed the key portions of the history and exam documented by Dr. Amin and I reviewed pertinent patient test results.  The assessment, diagnosis, and plan were formulated together and I agree with the documentation in the resident's note. 

## 2018-05-07 ENCOUNTER — Other Ambulatory Visit: Payer: Self-pay | Admitting: Urology

## 2018-05-07 ENCOUNTER — Telehealth: Payer: Self-pay | Admitting: Cardiovascular Disease

## 2018-05-07 NOTE — Telephone Encounter (Signed)
New Message       [facility] Pre-operative Risk Assessment    Request for surgical clearance:  1. What type of surgery is being performed? Removal of left kidney   2. When is this surgery scheduled? 05/23/2018   3. What type of clearance is required (medical clearance vs. Pharmacy clearance to hold med vs. Both)? Bothl  4. Are there any medications that need to be held prior to surgery and how long? Plavix one week before   5. Practice name and name of physician performing surgery? [facility] Dr. Alexis Frock   6. What is your office phone number [phone] ext [phone]   7.   What is your office fax number [phone]  8.   Anesthesia type (None, local, MAC, general) ? general   Charles Hart 05/07/2018, 8:31 AM  _________________________________________________________________   (provider comments below)

## 2018-05-07 NOTE — Telephone Encounter (Signed)
   Primary Cardiologist: Charles Rouge, MD  Chart reviewed as part of pre-operative protocol coverage. Patient was contacted 05/07/2018 in reference to pre-operative risk assessment for pending surgery as outlined below.  Charles Hart was last seen on 02/14/18 by Charles Hart. She has history of poorly controlled HTN, prior CVA in 2013 (on chronic Plavix by primary care), HLD, normal coronaries by cath in 2004 and 2003, DM, ESRD and NICM. Remote EF in 30-40% with normal coronaries by cath 2008. Fluctuating EF since that time, 40-45% in 04/2015, 25-30% in 02/2017, and 35-40% in 01/2018. Not on ACEI/ARB/spiro due to CKD; also ACEI allergy. By Dr. Kyla Hart note in 12/2017, "He is cleared to have general anesthesia For nephrectomy if needed for renal cancer." Meds have not been escalated further otherwise given lability of BP. RCRI is >11% so he is at increased risk for complications purely based on medical history. However, he denies any change whatsoever since OV with Dr. Johnsie Hart 12/2017. He is able to complete over 4 METS without any angina or dyspnea. He denies any orthopnea. He does report some leg discomfort which he attributes to dialysis and does not participate in higher levels of activity due to that. Therefore, based on ACC/AHA guidelines, the patient would be at acceptable risk for the planned procedure without further cardiovascular testing.   He is not on Plavix for cardiac reasons. This is managed by his primary care for history of stroke so we would recommend the requesting surgeon contact their office for clearance to come off.   - I will route this recommendation to the requesting party via Epic fax function and remove from pre-op pool. Please call with questions.  - I will route to Dr. Johnsie Hart to make him aware to review and provide any additional recs if needed.  - I will also route to pre-op callback staff to send a staff message to schedulers to call patient to schedule overdue f/u of  carotid duplex (was due 03/2018).  [name], PA-C 05/07/2018, 4:01 PM

## 2018-05-14 ENCOUNTER — Telehealth: Payer: Self-pay

## 2018-05-14 NOTE — Progress Notes (Signed)
EKG 12-28-17 Epic ECHO 02-07-18 [place] 05-07-18 Epic CHEST XRAY 12-28-17 EPIC

## 2018-05-14 NOTE — Patient Instructions (Addendum)
Charles Hart  05/14/2018   Your procedure is scheduled on: 05-30-18  Report to [facility] Main  Entrance  Report to admitting at 930  AM    Call this number if you have problems the morning of surgery [phone]   Remember: Do not eat food After Midnight Monday, CLEAR LIQUIDS ALL DAY Tuesday, NOTHING BY MOUTH AFTER MIDNIGHT Tuesday NIGHT. NIGHT BRUSH YOUR TEETH MORNING OF SURGERY AND RINSE YOUR MOUTH OUT, NO CHEWING GUM CANDY OR MINTS. FOLLOW ALL DR [facility] BOWELPREP INSTRUCTIONS.  Go to dr Tresa [name] and get bowel prep instructions and ask about stopping clopidrogel (plavix)    BRING CPAP MASK AND TUBING CLEAR LIQUID DIET   Foods Allowed                                                                     Foods Excluded  Coffee and tea, regular and decaf                             liquids that you cannot  Plain Jell-O in any flavor                                             see through such as: Fruit ices (not with fruit pulp)                                     milk, soups, orange juice  Iced Popsicles                                    All solid food Carbonated beverages, regular and diet                                    Cranberry, grape and apple juices Sports drinks like Gatorade Lightly seasoned clear broth or consume(fat free) Sugar, honey syrup  Sample Menu Breakfast                                Lunch                                     Supper Cranberry juice                    Beef broth                            Chicken broth Jell-O  Grape juice                           Apple juice Coffee or tea                        Jell-O                                      Popsicle                                                Coffee or tea                        Coffee or tea  _____________________________________________________________________     Take these medicines the morning of surgery with A SIP  OF WATER: CARTIA XT, CARVEDILOL (COREG), HYDRAZALINE, LIDODERM PATCH IF NEEDED,  PRAVASTATIN (PRAVACHOL). TYLENOL AND TRAMADOL IF NEEDED  DO NOT TAKE ANY DIABETIC MEDICATIONS DAY OF YOUR SURGERY                  TAKE VICTOZA AS USUAL DAY BEFORE SURGERY 05-29-18 , DO NOT TAKE VICTOZA DAY OF SURGERY 05-30-18              You may not have any metal on your body including hair pins and              piercings  Do not wear jewelry, make-up, lotions, powders or perfumes, deodorant             Do not wear nail polish.  Do not shave  48 hours prior to surgery.              Men may shave face and neck.   Do not bring valuables to the hospital. [place].  Contacts, dentures or bridgework may not be worn into surgery.  Leave suitcase in the car. After surgery it may be brought to your room.                  Please read over the following fact sheets you were given: _____________________________________________________________________ [facility] - Preparing for Surgery Before surgery, you can play an important role.  Because skin is not sterile, your skin needs to be as free of germs as possible.  You can reduce the number of germs on your skin by washing with CHG (chlorahexidine gluconate) soap before surgery.  CHG is an antiseptic cleaner which kills germs and bonds with the skin to continue killing germs even after washing. Please DO NOT use if you have an allergy to CHG or antibacterial soaps.  If your skin becomes reddened/irritated stop using the CHG and inform your nurse when you arrive at Short Stay. Do not shave (including legs and underarms) for at least 48 hours prior to the first CHG shower.  You may shave your face/neck. Please follow these instructions carefully:  1.  Shower with CHG Soap the night before surgery and the  morning of Surgery.  2.  If you choose to wash your hair, wash your hair first as  usual with your  normal  shampoo.  3.   After you shampoo, rinse your hair and body thoroughly to remove the  shampoo.                           4.  Use CHG as you would any other liquid soap.  You can apply chg directly  to the skin and wash                       Gently with a scrungie or clean washcloth.  5.  Apply the CHG Soap to your body ONLY FROM THE NECK DOWN.   Do not use on face/ open                           Wound or open sores. Avoid contact with eyes, ears mouth and genitals (private parts).                       Wash face,  Genitals (private parts) with your normal soap.             6.  Wash thoroughly, paying special attention to the area where your surgery  will be performed.  7.  Thoroughly rinse your body with warm water from the neck down.  8.  DO NOT shower/wash with your normal soap after using and rinsing off  the CHG Soap.                9.  Pat yourself dry with a clean towel.            10.  Wear clean pajamas.            11.  Place clean sheets on your bed the night of your first shower and do not  sleep with pets. Day of Surgery : Do not apply any lotions/deodorants the morning of surgery.  Please wear clean clothes to the hospital/surgery center.  FAILURE TO FOLLOW THESE INSTRUCTIONS MAY RESULT IN THE CANCELLATION OF YOUR SURGERY PATIENT SIGNATURE_________________________________  NURSE SIGNATURE__________________________________  ________________________________________________________________________

## 2018-05-14 NOTE — Telephone Encounter (Signed)
   [facility] Pre-operative Risk Assessment    Request for surgical clearance:  1. What type of surgery is being performed? Left Lap Radical nephrectomy   2. When is this surgery scheduled?  05/30/18   3. What type of clearance is required (medical clearance vs. Pharmacy clearance to hold med vs. Both)? Medical  4. Are there any medications that need to be held prior to surgery and how long? None   5. Practice name and name of physician performing surgery?  Dr Alexis Frock [facility]   6. What is your office phone number [phone]    7.   What is your office fax number [phone]  8.   Anesthesia type (None, local, MAC, general) ? General   Charles Hart 05/14/2018, 5:35 PM  _________________________________________________________________   (provider comments below)

## 2018-05-15 NOTE — Telephone Encounter (Signed)
     Primary Cardiologist: [name], MD  The patient is already cleared for surgery by Melina Copa 05/07/18 as below:  "Chart reviewed as part of pre-operative protocol coverage. Patient was contacted 05/07/2018 in reference to pre-operative risk assessment for pending surgery as outlined below.  Charles Hart was last seen on 02/14/18 by [name]. She has history of poorly controlled HTN, prior CVA in 2013 (on chronic Plavix by primary care), HLD, normal coronaries by cath in 2004 and 2003, DM, ESRD and NICM. Remote EF in 30-40% with normal coronaries by cath 2008. Fluctuating EF since that time, 40-45% in 04/2015, 25-30% in 02/2017, and 35-40% in 01/2018. Not on ACEI/ARB/spiro due to CKD; also ACEI allergy. By Dr. Kyla Balzarine note in 12/2017, "He is cleared to have general anesthesia For nephrectomy if needed for renal cancer." Meds have not been escalated further otherwise given lability of BP. RCRI is >11% so he is at increased risk for complications purely based on medical history. However, he denies any change whatsoever since OV with Dr. [name] 12/2017. He is able to complete over 4 METS without any angina or dyspnea. He denies any orthopnea. He does report some leg discomfort which he attributes to dialysis and does not participate in higher levels of activity due to that. Therefore, based on ACC/AHA guidelines, the patient would be at acceptable risk for the planned procedure without further cardiovascular testing".   Dr. [name], Patient is due for carotid doppler. Is it okay to proceed with surgery or need to have prior?  Please forward your response to P CV DIV PREOP.   Thank you   Leanor Kail, PA-C

## 2018-05-16 NOTE — Telephone Encounter (Signed)
OK 

## 2018-05-16 NOTE — Telephone Encounter (Signed)
   Primary Cardiologist: [name], MD  Chart reviewed as part of pre-operative protocol coverage.   The patient does not need any further testing.  He may proceed with his surgery as previously outlined.  He does not need to have follow up carotid ultrasound prior to his surgery.  Due to his history, he is at high risk for perioperative major cardiac events. His Plavix is managed by primary care for his history of prior stroke.  He does not have known CAD.  Management of Plavix will be per his primary care provider. Please call with questions.   Call back staff: Please make sure the urology office received the note. I will remove this from the preop pool.   Richardson Dopp, PA-C 05/16/2018, 3:48 PM

## 2018-05-16 NOTE — Telephone Encounter (Signed)
[facility] to see if they received the cardiac clearance and Marlowe Kays confirms that they did receive it.

## 2018-05-18 ENCOUNTER — Other Ambulatory Visit: Payer: Self-pay

## 2018-05-18 ENCOUNTER — Encounter (HOSPITAL_COMMUNITY)
Admission: RE | Admit: 2018-05-18 | Discharge: 2018-05-18 | Disposition: A | Discharge status: Home / Self Care | Payer: 59 | Source: Ambulatory Visit | Attending: Urology | Admitting: Urology

## 2018-05-18 ENCOUNTER — Encounter (HOSPITAL_COMMUNITY): Payer: Self-pay

## 2018-05-18 ENCOUNTER — Telehealth: Payer: Self-pay | Admitting: Internal Medicine

## 2018-05-18 ENCOUNTER — Encounter: Payer: Self-pay | Admitting: Internal Medicine

## 2018-05-18 DIAGNOSIS — Z01812 Encounter for preprocedural laboratory examination: Secondary | ICD-10-CM | POA: Diagnosis present

## 2018-05-18 LAB — GLUCOSE, CAPILLARY: GLUCOSE-CAPILLARY: 83 mg/dL (ref 70–99)

## 2018-05-18 NOTE — Progress Notes (Signed)
LEFT CHART WITH IHECHI EWARUM FOR FOLLOW UP OF HEMAGLOBIN A1C RESULT

## 2018-05-18 NOTE — Progress Notes (Signed)
CALLED AND LEFT MESSAGE WITH [name] PATIENT COMING TO PICK UP BOWEL PREP INSTRUCTIONS AFTER PRE OP.

## 2018-05-18 NOTE — Telephone Encounter (Signed)
Charles Hart has planned Robotic left radical nephrectomy on 05/30/18.  I have been asked by Dr Tresa Moore for surgical clearance and advice about management of plavix.  He is on plavix for history of stroke, it would be fine to hold plavis for 1 week prior to surgery and resume after once bleeding is controlled.  Cardiology has recently evaluated and also has been asked for clearance, as noted he is at increased risk for complications due to his extensive past medical history however has been abl eto complete >4 METS and no further cardiovascular testing is needed.  I do not recommend any other testing at this time.    He may proceed with left nephrectomy.  Charles Groves, DO 4:59 PM 05/18/18

## 2018-05-19 LAB — HEMOGLOBIN A1C
Hgb A1c MFr Bld: 4.8 % (ref 4.8–5.6)
MEAN PLASMA GLUCOSE: 91 mg/dL

## 2018-05-30 ENCOUNTER — Other Ambulatory Visit: Payer: Self-pay

## 2018-05-30 ENCOUNTER — Encounter (HOSPITAL_COMMUNITY): Payer: Self-pay | Admitting: Emergency Medicine

## 2018-05-30 ENCOUNTER — Inpatient Hospital Stay (HOSPITAL_COMMUNITY): Payer: 59 | Admitting: Anesthesiology

## 2018-05-30 ENCOUNTER — Encounter (HOSPITAL_COMMUNITY)
Admission: RE | Disposition: A | Discharge status: Home / Self Care | Payer: Self-pay | Source: Home / Self Care | Attending: Urology

## 2018-05-30 ENCOUNTER — Inpatient Hospital Stay (HOSPITAL_COMMUNITY)
Admission: RE | Admit: 2018-05-30 | Discharge: 2018-06-02 | DRG: 656 | Disposition: A | Discharge status: Home / Self Care | Payer: 59 | Attending: Urology | Admitting: Urology

## 2018-05-30 DIAGNOSIS — G4730 Sleep apnea, unspecified: Secondary | ICD-10-CM | POA: Diagnosis not present

## 2018-05-30 DIAGNOSIS — N186 End stage renal disease: Secondary | ICD-10-CM | POA: Diagnosis present

## 2018-05-30 DIAGNOSIS — H9191 Unspecified hearing loss, right ear: Secondary | ICD-10-CM | POA: Diagnosis present

## 2018-05-30 DIAGNOSIS — E1122 Type 2 diabetes mellitus with diabetic chronic kidney disease: Secondary | ICD-10-CM | POA: Diagnosis present

## 2018-05-30 DIAGNOSIS — C642 Malignant neoplasm of left kidney, except renal pelvis: Secondary | ICD-10-CM | POA: Diagnosis not present

## 2018-05-30 DIAGNOSIS — Z992 Dependence on renal dialysis: Secondary | ICD-10-CM | POA: Diagnosis not present

## 2018-05-30 DIAGNOSIS — N2581 Secondary hyperparathyroidism of renal origin: Secondary | ICD-10-CM | POA: Diagnosis not present

## 2018-05-30 DIAGNOSIS — Z8673 Personal history of transient ischemic attack (TIA), and cerebral infarction without residual deficits: Secondary | ICD-10-CM

## 2018-05-30 DIAGNOSIS — I429 Cardiomyopathy, unspecified: Secondary | ICD-10-CM | POA: Diagnosis present

## 2018-05-30 DIAGNOSIS — I132 Hypertensive heart and chronic kidney disease with heart failure and with stage 5 chronic kidney disease, or end stage renal disease: Secondary | ICD-10-CM | POA: Diagnosis not present

## 2018-05-30 DIAGNOSIS — E1121 Type 2 diabetes mellitus with diabetic nephropathy: Secondary | ICD-10-CM | POA: Diagnosis not present

## 2018-05-30 DIAGNOSIS — C7A093 Malignant carcinoid tumor of the kidney: Secondary | ICD-10-CM | POA: Insufficient documentation

## 2018-05-30 DIAGNOSIS — D631 Anemia in chronic kidney disease: Secondary | ICD-10-CM | POA: Diagnosis present

## 2018-05-30 DIAGNOSIS — E785 Hyperlipidemia, unspecified: Secondary | ICD-10-CM | POA: Diagnosis not present

## 2018-05-30 DIAGNOSIS — N2889 Other specified disorders of kidney and ureter: Secondary | ICD-10-CM | POA: Diagnosis present

## 2018-05-30 DIAGNOSIS — Z800 Family history of malignant neoplasm of digestive organs: Secondary | ICD-10-CM | POA: Diagnosis not present

## 2018-05-30 DIAGNOSIS — I2510 Atherosclerotic heart disease of native coronary artery without angina pectoris: Secondary | ICD-10-CM | POA: Diagnosis not present

## 2018-05-30 DIAGNOSIS — I252 Old myocardial infarction: Secondary | ICD-10-CM | POA: Diagnosis not present

## 2018-05-30 DIAGNOSIS — E1142 Type 2 diabetes mellitus with diabetic polyneuropathy: Secondary | ICD-10-CM | POA: Diagnosis not present

## 2018-05-30 DIAGNOSIS — I5042 Chronic combined systolic (congestive) and diastolic (congestive) heart failure: Secondary | ICD-10-CM | POA: Diagnosis present

## 2018-05-30 HISTORY — DX: End stage renal disease: N18.6

## 2018-05-30 HISTORY — PX: ROBOT ASSISTED LAPAROSCOPIC NEPHRECTOMY: SHX5140

## 2018-05-30 HISTORY — DX: Dependence on renal dialysis: Z99.2

## 2018-05-30 LAB — BASIC METABOLIC PANEL
Anion gap: 13 (ref 5–15)
Anion gap: 12 (ref 5–15)
BUN: 31 mg/dL — AB (ref 8–23)
BUN: 35 mg/dL — ABNORMAL HIGH (ref 8–23)
CALCIUM: 9.4 mg/dL (ref 8.9–10.3)
CALCIUM: 8.5 mg/dL — AB (ref 8.9–10.3)
CO2: 30 mmol/L (ref 22–32)
CO2: 31 mmol/L (ref 22–32)
CREATININE: 5.33 mg/dL — AB (ref 0.61–1.24)
CREATININE: 6.05 mg/dL — AB (ref 0.61–1.24)
Chloride: 98 mmol/L (ref 98–111)
Chloride: 99 mmol/L (ref 98–111)
GFR calc Af Amer: 12 mL/min — ABNORMAL LOW (ref >60)
GFR calc Af Amer: 10 mL/min — ABNORMAL LOW (ref >60)
GFR calc non Af Amer: 9 mL/min — ABNORMAL LOW (ref >60)
GFR, EST NON AFRICAN AMERICAN: 10 mL/min — AB (ref >60)
GLUCOSE: 100 mg/dL — AB (ref 70–99)
GLUCOSE: 137 mg/dL — AB (ref 70–99)
POTASSIUM: 3.3 mmol/L — AB (ref 3.5–5.1)
Potassium: 3.3 mmol/L — ABNORMAL LOW (ref 3.5–5.1)
SODIUM: 141 mmol/L (ref 135–145)
Sodium: 142 mmol/L (ref 135–145)

## 2018-05-30 LAB — CBC
HCT: 39.7 % (ref 39.0–52.0)
HEMOGLOBIN: 13 g/dL (ref 13.0–17.0)
MCH: 28.9 pg (ref 26.0–34.0)
MCHC: 32.7 g/dL (ref 30.0–36.0)
MCV: 88.2 fL (ref 80.0–100.0)
Platelets: 254 10*3/uL (ref 150–400)
RBC: 4.5 MIL/uL (ref 4.22–5.81)
RDW: 12.9 % (ref 11.5–15.5)
WBC: 5.4 10*3/uL (ref 4.0–10.5)
nRBC: 0 % (ref 0.0–0.2)

## 2018-05-30 LAB — HEMOGLOBIN AND HEMATOCRIT, BLOOD
HCT: 38.4 % — ABNORMAL LOW (ref 39.0–52.0)
HEMOGLOBIN: 12.1 g/dL — AB (ref 13.0–17.0)

## 2018-05-30 LAB — TYPE AND SCREEN
ABO/RH(D): A POS
Antibody Screen: NEGATIVE

## 2018-05-30 LAB — GLUCOSE, CAPILLARY
GLUCOSE-CAPILLARY: 123 mg/dL — AB (ref 70–99)
Glucose-Capillary: 101 mg/dL — ABNORMAL HIGH (ref 70–99)
Glucose-Capillary: 140 mg/dL — ABNORMAL HIGH (ref 70–99)

## 2018-05-30 LAB — ABO/RH: ABO/RH(D): A POS

## 2018-05-30 SURGERY — NEPHRECTOMY, RADICAL, ROBOT-ASSISTED, LAPAROSCOPIC, ADULT
Anesthesia: General | Laterality: Left

## 2018-05-30 MED ORDER — MIDAZOLAM HCL 5 MG/5ML IJ SOLN
INTRAMUSCULAR | Status: DC | PRN
  Administered 2018-05-30: 1 mg via INTRAVENOUS

## 2018-05-30 MED ORDER — BUPIVACAINE LIPOSOME 1.3 % IJ SUSP
20.0000 mL | Freq: Once | INTRAMUSCULAR | Status: AC
  Administered 2018-05-30: 266 mg
  Administered 2018-05-30: 20 mL
  Filled 2018-05-30: qty 20

## 2018-05-30 MED ORDER — LIDOCAINE 2% (20 MG/ML) 5 ML SYRINGE
INTRAMUSCULAR | Status: DC | PRN
  Administered 2018-05-30: 60 mg via INTRAVENOUS

## 2018-05-30 MED ORDER — GABAPENTIN 300 MG PO CAPS
300.0000 mg | ORAL_CAPSULE | Freq: Every day | ORAL | Status: DC
  Administered 2018-05-30 – 2018-06-01 (×3): 300 mg via ORAL
  Filled 2018-05-30 (×3): qty 1

## 2018-05-30 MED ORDER — DEXAMETHASONE SODIUM PHOSPHATE 10 MG/ML IJ SOLN
INTRAMUSCULAR | Status: AC
  Filled 2018-05-30: qty 1

## 2018-05-30 MED ORDER — SODIUM CHLORIDE 0.9 % IV SOLN
INTRAVENOUS | Status: DC | PRN
  Administered 2018-05-30: 25 ug/min via INTRAVENOUS

## 2018-05-30 MED ORDER — PROPOFOL 10 MG/ML IV BOLUS
INTRAVENOUS | Status: AC
  Filled 2018-05-30: qty 20

## 2018-05-30 MED ORDER — CEFAZOLIN SODIUM-DEXTROSE 2-4 GM/100ML-% IV SOLN
2.0000 g | INTRAVENOUS | Status: DC
  Filled 2018-05-30: qty 100

## 2018-05-30 MED ORDER — HYDRALAZINE HCL 50 MG PO TABS
100.0000 mg | ORAL_TABLET | Freq: Three times a day (TID) | ORAL | Status: DC
  Administered 2018-05-30: 100 mg via ORAL
  Filled 2018-05-30: qty 2

## 2018-05-30 MED ORDER — ACETAMINOPHEN 500 MG PO TABS
1000.0000 mg | ORAL_TABLET | Freq: Four times a day (QID) | ORAL | Status: AC
  Administered 2018-05-30 – 2018-05-31 (×4): 1000 mg via ORAL
  Filled 2018-05-30 (×5): qty 2

## 2018-05-30 MED ORDER — OXYCODONE HCL 5 MG/5ML PO SOLN: 5.0000 mg | Freq: Once | ORAL | Status: DC | PRN

## 2018-05-30 MED ORDER — LACTATED RINGERS IR SOLN
Status: DC | PRN
  Administered 2018-05-30: 1000 mL

## 2018-05-30 MED ORDER — SODIUM CHLORIDE 0.45 % IV SOLN
INTRAVENOUS | Status: DC
  Administered 2018-05-30: 18:00:00 via INTRAVENOUS

## 2018-05-30 MED ORDER — LIDOCAINE 2% (20 MG/ML) 5 ML SYRINGE
INTRAMUSCULAR | Status: AC
  Filled 2018-05-30: qty 5

## 2018-05-30 MED ORDER — MAGNESIUM CITRATE PO SOLN: 1.0000 | Freq: Once | ORAL | Status: DC

## 2018-05-30 MED ORDER — FENTANYL CITRATE (PF) 100 MCG/2ML IJ SOLN
25.0000 ug | INTRAMUSCULAR | Status: DC | PRN
  Administered 2018-05-30 (×2): 50 ug via INTRAVENOUS

## 2018-05-30 MED ORDER — FUROSEMIDE 40 MG PO TABS
80.0000 mg | ORAL_TABLET | Freq: Two times a day (BID) | ORAL | Status: DC
  Filled 2018-05-30: qty 2

## 2018-05-30 MED ORDER — HYDROMORPHONE HCL 1 MG/ML IJ SOLN
0.5000 mg | INTRAMUSCULAR | Status: DC | PRN
  Administered 2018-05-30 – 2018-06-01 (×9): 1 mg via INTRAVENOUS
  Filled 2018-05-30 (×9): qty 1

## 2018-05-30 MED ORDER — OXYCODONE HCL 5 MG PO TABS: 5.0000 mg | ORAL_TABLET | Freq: Once | ORAL | Status: DC | PRN

## 2018-05-30 MED ORDER — PROMETHAZINE HCL 25 MG/ML IJ SOLN
6.2500 mg | INTRAMUSCULAR | Status: DC | PRN
  Administered 2018-05-30: 6.25 mg via INTRAVENOUS

## 2018-05-30 MED ORDER — CISATRACURIUM BESYLATE 20 MG/10ML IV SOLN
INTRAVENOUS | Status: AC
  Filled 2018-05-30: qty 10

## 2018-05-30 MED ORDER — SODIUM CHLORIDE 0.9 % IV SOLN
INTRAVENOUS | Status: DC
  Administered 2018-05-30: 10:00:00 via INTRAVENOUS

## 2018-05-30 MED ORDER — HYDROCODONE-ACETAMINOPHEN 5-325 MG PO TABS: 1.0000 | ORAL_TABLET | ORAL | 0 refills | Status: DC | PRN

## 2018-05-30 MED ORDER — NEOSTIGMINE METHYLSULFATE 10 MG/10ML IV SOLN
INTRAVENOUS | Status: DC | PRN
  Administered 2018-05-30: 3 mg via INTRAVENOUS

## 2018-05-30 MED ORDER — NEOSTIGMINE METHYLSULFATE 5 MG/5ML IV SOSY
PREFILLED_SYRINGE | INTRAVENOUS | Status: AC
  Filled 2018-05-30: qty 5

## 2018-05-30 MED ORDER — DILTIAZEM HCL ER COATED BEADS 180 MG PO CP24
180.0000 mg | ORAL_CAPSULE | Freq: Every day | ORAL | Status: DC
Start: 2018-05-30 — End: 2018-05-31
  Administered 2018-05-30: 180 mg via ORAL
  Filled 2018-05-30 (×3): qty 1

## 2018-05-30 MED ORDER — PRAVASTATIN SODIUM 40 MG PO TABS
40.0000 mg | ORAL_TABLET | Freq: Every day | ORAL | Status: DC
  Administered 2018-05-31 – 2018-06-01 (×2): 40 mg via ORAL
  Filled 2018-05-30 (×2): qty 1

## 2018-05-30 MED ORDER — ONDANSETRON HCL 4 MG/2ML IJ SOLN
INTRAMUSCULAR | Status: DC | PRN
  Administered 2018-05-30: 4 mg via INTRAVENOUS

## 2018-05-30 MED ORDER — OXYCODONE HCL 5 MG/5ML PO SOLN
5.0000 mg | Freq: Once | ORAL | Status: DC | PRN
  Filled 2018-05-30: qty 5

## 2018-05-30 MED ORDER — DIPHENHYDRAMINE HCL 12.5 MG/5ML PO ELIX
12.5000 mg | ORAL_SOLUTION | Freq: Four times a day (QID) | ORAL | Status: DC | PRN
  Administered 2018-06-01: 25 mg via ORAL
  Filled 2018-05-30: qty 10

## 2018-05-30 MED ORDER — OXYCODONE HCL 5 MG PO TABS
5.0000 mg | ORAL_TABLET | ORAL | Status: DC | PRN
  Administered 2018-05-31 – 2018-06-02 (×4): 5 mg via ORAL
  Filled 2018-05-30 (×4): qty 1

## 2018-05-30 MED ORDER — SUCCINYLCHOLINE CHLORIDE 200 MG/10ML IV SOSY
PREFILLED_SYRINGE | INTRAVENOUS | Status: DC | PRN
  Administered 2018-05-30: 120 mg via INTRAVENOUS

## 2018-05-30 MED ORDER — CARVEDILOL 25 MG PO TABS
25.0000 mg | ORAL_TABLET | Freq: Once | ORAL | Status: AC
  Administered 2018-05-30: 25 mg via ORAL
  Filled 2018-05-30 (×2): qty 1

## 2018-05-30 MED ORDER — FENTANYL CITRATE (PF) 100 MCG/2ML IJ SOLN
INTRAMUSCULAR | Status: AC
  Administered 2018-05-30: 17:00:00
  Filled 2018-05-30: qty 2

## 2018-05-30 MED ORDER — ONDANSETRON HCL 4 MG/2ML IJ SOLN
4.0000 mg | INTRAMUSCULAR | Status: DC | PRN
  Administered 2018-05-30 – 2018-06-01 (×4): 4 mg via INTRAVENOUS
  Filled 2018-05-30 (×4): qty 2

## 2018-05-30 MED ORDER — PROMETHAZINE HCL 25 MG/ML IJ SOLN
INTRAMUSCULAR | Status: AC
  Administered 2018-05-30: 17:00:00
  Filled 2018-05-30: qty 1

## 2018-05-30 MED ORDER — ONDANSETRON HCL 4 MG/2ML IJ SOLN
4.0000 mg | Freq: Once | INTRAMUSCULAR | Status: AC
  Administered 2018-05-30: 4 mg via INTRAVENOUS

## 2018-05-30 MED ORDER — SODIUM CHLORIDE 0.9 % IJ SOLN
INTRAMUSCULAR | Status: AC
  Filled 2018-05-30: qty 20

## 2018-05-30 MED ORDER — SODIUM CHLORIDE 0.9 % IV SOLN
INTRAVENOUS | Status: DC
  Administered 2018-05-30 (×2): via INTRAVENOUS

## 2018-05-30 MED ORDER — ONDANSETRON HCL 4 MG/2ML IJ SOLN: 4.0000 mg | Freq: Once | INTRAMUSCULAR | Status: DC | PRN

## 2018-05-30 MED ORDER — CALCIUM ACETATE (PHOS BINDER) 667 MG PO CAPS
1334.0000 mg | ORAL_CAPSULE | Freq: Three times a day (TID) | ORAL | Status: DC
  Administered 2018-05-31 – 2018-06-02 (×5): 1334 mg via ORAL
  Filled 2018-05-30 (×7): qty 2

## 2018-05-30 MED ORDER — FENTANYL CITRATE (PF) 250 MCG/5ML IJ SOLN
INTRAMUSCULAR | Status: AC
  Filled 2018-05-30: qty 5

## 2018-05-30 MED ORDER — MIDAZOLAM HCL 2 MG/2ML IJ SOLN
INTRAMUSCULAR | Status: AC
  Filled 2018-05-30: qty 2

## 2018-05-30 MED ORDER — ONDANSETRON HCL 4 MG/2ML IJ SOLN
INTRAMUSCULAR | Status: AC
  Filled 2018-05-30: qty 2

## 2018-05-30 MED ORDER — CISATRACURIUM BESYLATE (PF) 10 MG/5ML IV SOLN
INTRAVENOUS | Status: DC | PRN
  Administered 2018-05-30: 4 mg via INTRAVENOUS
  Administered 2018-05-30: 2 mg via INTRAVENOUS
  Administered 2018-05-30: 4 mg via INTRAVENOUS
  Administered 2018-05-30: 10 mg via INTRAVENOUS

## 2018-05-30 MED ORDER — PROPOFOL 10 MG/ML IV BOLUS
INTRAVENOUS | Status: DC | PRN
  Administered 2018-05-30: 140 mg via INTRAVENOUS

## 2018-05-30 MED ORDER — FENTANYL CITRATE (PF) 100 MCG/2ML IJ SOLN: 25.0000 ug | INTRAMUSCULAR | Status: DC | PRN

## 2018-05-30 MED ORDER — CARVEDILOL 25 MG PO TABS
25.0000 mg | ORAL_TABLET | Freq: Two times a day (BID) | ORAL | Status: DC
  Administered 2018-05-30 – 2018-06-01 (×2): 25 mg via ORAL
  Filled 2018-05-30 (×4): qty 1

## 2018-05-30 MED ORDER — DIPHENHYDRAMINE HCL 50 MG/ML IJ SOLN: 12.5000 mg | Freq: Four times a day (QID) | INTRAMUSCULAR | Status: DC | PRN

## 2018-05-30 MED ORDER — SODIUM CHLORIDE 0.9 % IJ SOLN
INTRAMUSCULAR | Status: DC | PRN
  Administered 2018-05-30: 20 mL

## 2018-05-30 MED ORDER — FENTANYL CITRATE (PF) 100 MCG/2ML IJ SOLN
INTRAMUSCULAR | Status: DC | PRN
  Administered 2018-05-30 (×4): 50 ug via INTRAVENOUS

## 2018-05-30 MED ORDER — PHENYLEPHRINE 40 MCG/ML (10ML) SYRINGE FOR IV PUSH (FOR BLOOD PRESSURE SUPPORT)
PREFILLED_SYRINGE | INTRAVENOUS | Status: DC | PRN
  Administered 2018-05-30: 120 ug via INTRAVENOUS
  Administered 2018-05-30: 80 ug via INTRAVENOUS

## 2018-05-30 MED ORDER — CHLORHEXIDINE GLUCONATE CLOTH 2 % EX PADS
6.0000 | MEDICATED_PAD | Freq: Every day | CUTANEOUS | Status: DC
  Administered 2018-05-31: 6 via TOPICAL

## 2018-05-30 MED ORDER — PHENYLEPHRINE HCL 10 MG/ML IJ SOLN
INTRAMUSCULAR | Status: AC
  Filled 2018-05-30: qty 2

## 2018-05-30 MED ORDER — CEFAZOLIN SODIUM-DEXTROSE 2-3 GM-%(50ML) IV SOLR
INTRAVENOUS | Status: DC | PRN
  Administered 2018-05-30: 2 g via INTRAVENOUS

## 2018-05-30 MED ORDER — STERILE WATER FOR IRRIGATION IR SOLN
Status: DC | PRN
  Administered 2018-05-30: 1000 mL

## 2018-05-30 MED ORDER — INSULIN ASPART 100 UNIT/ML ~~LOC~~ SOLN
0.0000 [IU] | Freq: Three times a day (TID) | SUBCUTANEOUS | Status: DC
  Administered 2018-06-01: 2 [IU] via SUBCUTANEOUS

## 2018-05-30 MED ORDER — DEXAMETHASONE SODIUM PHOSPHATE 10 MG/ML IJ SOLN
INTRAMUSCULAR | Status: DC | PRN
  Administered 2018-05-30: 10 mg via INTRAVENOUS

## 2018-05-30 MED ORDER — ONDANSETRON HCL 4 MG/2ML IJ SOLN
INTRAMUSCULAR | Status: AC
  Administered 2018-05-30: 17:00:00
  Filled 2018-05-30: qty 2

## 2018-05-30 MED ORDER — ROCURONIUM BROMIDE 10 MG/ML (PF) SYRINGE
PREFILLED_SYRINGE | INTRAVENOUS | Status: AC
  Filled 2018-05-30: qty 10

## 2018-05-30 MED ORDER — GLYCOPYRROLATE PF 0.2 MG/ML IJ SOSY
PREFILLED_SYRINGE | INTRAMUSCULAR | Status: AC
  Filled 2018-05-30: qty 2

## 2018-05-30 MED ORDER — GLYCOPYRROLATE 0.2 MG/ML IJ SOLN
INTRAMUSCULAR | Status: DC | PRN
  Administered 2018-05-30: 0.4 mg via INTRAVENOUS

## 2018-05-30 SURGICAL SUPPLY — 57 items
BAG LAPAROSCOPIC 12 15 PORT 16 (BASKET) ×1 IMPLANT
BAG RETRIEVAL 12/15 (BASKET) ×2
CHLORAPREP W/TINT 26ML (MISCELLANEOUS) ×2 IMPLANT
CLIP VESOLOCK LG 6/CT PURPLE (CLIP) ×2 IMPLANT
CLIP VESOLOCK MED LG 6/CT (CLIP) ×2 IMPLANT
CLIP VESOLOCK XL 6/CT (CLIP) ×2 IMPLANT
COVER SURGICAL LIGHT HANDLE (MISCELLANEOUS) ×2 IMPLANT
COVER TIP SHEARS 8 DVNC (MISCELLANEOUS) ×1 IMPLANT
COVER TIP SHEARS 8MM DA VINCI (MISCELLANEOUS) ×1
COVER WAND RF STERILE (DRAPES) ×2 IMPLANT
CUTTER ECHEON FLEX ENDO 45 340 (ENDOMECHANICALS) ×2 IMPLANT
DECANTER SPIKE VIAL GLASS SM (MISCELLANEOUS) ×2 IMPLANT
DERMABOND ADVANCED (GAUZE/BANDAGES/DRESSINGS) ×1
DERMABOND ADVANCED .7 DNX12 (GAUZE/BANDAGES/DRESSINGS) ×1 IMPLANT
DRAIN CHANNEL 15F RND FF 3/16 (WOUND CARE) IMPLANT
DRAPE ARM DVNC X/XI (DISPOSABLE) ×4 IMPLANT
DRAPE COLUMN DVNC XI (DISPOSABLE) ×1 IMPLANT
DRAPE DA VINCI XI ARM (DISPOSABLE) ×4
DRAPE DA VINCI XI COLUMN (DISPOSABLE) ×1
DRAPE INCISE IOBAN 66X45 STRL (DRAPES) ×2 IMPLANT
DRAPE LAPAROSCOPIC ABDOMINAL (DRAPES) IMPLANT
DRAPE SHEET LG 3/4 BI-LAMINATE (DRAPES) ×2 IMPLANT
ELECT PENCIL ROCKER SW 15FT (MISCELLANEOUS) ×2 IMPLANT
ELECT REM PT RETURN 15FT ADLT (MISCELLANEOUS) ×2 IMPLANT
EVACUATOR SILICONE 100CC (DRAIN) IMPLANT
GLOVE BIO SURGEON STRL SZ 6.5 (GLOVE) ×2 IMPLANT
GLOVE BIOGEL M STRL SZ7.5 (GLOVE) ×4 IMPLANT
GOWN STRL REUS W/TWL LRG LVL3 (GOWN DISPOSABLE) ×10 IMPLANT
IRRIG SUCT STRYKERFLOW 2 WTIP (MISCELLANEOUS) ×2
IRRIGATION SUCT STRKRFLW 2 WTP (MISCELLANEOUS) ×1 IMPLANT
KIT BASIN OR (CUSTOM PROCEDURE TRAY) ×2 IMPLANT
LOOP VESSEL MAXI BLUE (MISCELLANEOUS) ×2 IMPLANT
NEEDLE INSUFFLATION 14GA 120MM (NEEDLE) ×2 IMPLANT
PORT ACCESS TROCAR AIRSEAL 12 (TROCAR) ×1 IMPLANT
PORT ACCESS TROCAR AIRSEAL 5M (TROCAR) ×1
POSITIONER SURGICAL ARM (MISCELLANEOUS) ×4 IMPLANT
RELOAD STAPLER WHITE 60MM (STAPLE) IMPLANT
SEAL CANN UNIV 5-8 DVNC XI (MISCELLANEOUS) ×4 IMPLANT
SEAL XI 5MM-8MM UNIVERSAL (MISCELLANEOUS) ×4
SET TRI-LUMEN FLTR TB AIRSEAL (TUBING) ×2 IMPLANT
SOLUTION ELECTROLUBE (MISCELLANEOUS) ×2 IMPLANT
SPONGE LAP 4X18 RFD (DISPOSABLE) ×2 IMPLANT
STAPLE ECHEON FLEX 60 POW ENDO (STAPLE) IMPLANT
STAPLE RELOAD 45 WHT (STAPLE) ×7 IMPLANT
STAPLE RELOAD 45MM WHITE (STAPLE) ×7
STAPLER RELOAD WHITE 60MM (STAPLE)
SUT ETHILON 3 0 PS 1 (SUTURE) IMPLANT
SUT MNCRL AB 4-0 PS2 18 (SUTURE) ×4 IMPLANT
SUT PDS AB 1 CT1 27 (SUTURE) ×10 IMPLANT
SUT VICRYL 0 UR6 27IN ABS (SUTURE) IMPLANT
TOWEL OR 17X26 10 PK STRL BLUE (TOWEL DISPOSABLE) ×2 IMPLANT
TOWEL OR NON WOVEN STRL DISP B (DISPOSABLE) ×2 IMPLANT
TRAY FOLEY MTR SLVR 16FR STAT (SET/KITS/TRAYS/PACK) ×2 IMPLANT
TRAY LAPAROSCOPIC (CUSTOM PROCEDURE TRAY) ×2 IMPLANT
TROCAR BLADELESS OPT 5 100 (ENDOMECHANICALS) IMPLANT
TROCAR XCEL 12X100 BLDLESS (ENDOMECHANICALS) ×2 IMPLANT
WATER STERILE IRR 1000ML POUR (IV SOLUTION) IMPLANT

## 2018-05-30 NOTE — Progress Notes (Signed)
Dr. Fransisco Beau notified that pts last dose of carvedilol was last night at 1730. Pt takes carvedilol 25mg  bid and did not take this morning. Verbal order given by Dr. Fransisco Beau to give one dose prior to surgery.

## 2018-05-30 NOTE — Anesthesia Procedure Notes (Signed)
Procedure Name: Intubation Date/Time: 05/30/2018 1:00 PM Performed by: West Pugh, CRNA Pre-anesthesia Checklist: Patient identified, Emergency Drugs available, Suction available, Patient being monitored and Timeout performed Patient Re-evaluated:Patient Re-evaluated prior to induction Oxygen Delivery Method: Circle system utilized Preoxygenation: Pre-oxygenation with 100% oxygen Induction Type: IV induction Ventilation: Mask ventilation without difficulty Laryngoscope Size: Mac and 4 Grade View: Grade I Tube type: Oral Tube size: 7.5 mm Number of attempts: 1 Airway Equipment and Method: Stylet Placement Confirmation: ETT inserted through vocal cords under direct vision,  positive ETCO2,  CO2 detector and breath sounds checked- equal and bilateral Secured at: 21 cm Tube secured with: Tape Dental Injury: Teeth and Oropharynx as per pre-operative assessment

## 2018-05-30 NOTE — Transfer of Care (Signed)
Immediate Anesthesia Transfer of Care Note  Patient: Charles Hart  Procedure(s) Performed: XI ROBOTIC ASSISTED LAPAROSCOPIC NEPHRECTOMY (Left )  Patient Location: PACU  Anesthesia Type:General  Level of Consciousness: awake, alert  and patient cooperative  Airway & Oxygen Therapy: Patient Spontanous Breathing and Patient connected to face mask oxygen  Post-op Assessment: Report given to RN and Post -op Vital signs reviewed and stable  Post vital signs: Reviewed and stable  Last Vitals:  Vitals Value Taken Time  BP 143/74 05/30/2018  3:30 PM  Temp    Pulse 62 05/30/2018  3:33 PM  Resp 16 05/30/2018  3:33 PM  SpO2 96 % 05/30/2018  3:33 PM  Vitals shown include unvalidated device data.  Last Pain:  Vitals:   05/30/18 0957  TempSrc:   PainSc: 2       Patients Stated Pain Goal: 4 (72/82/06 0156)  Complications: No apparent anesthesia complications

## 2018-05-30 NOTE — Discharge Instructions (Signed)
1- Drain Sites - You may have some mild persistent drainage from old drain site for several days, this is normal. This can be covered with cotton gauze for convenience.  2 - Stiches - Your stitches are all dissolvable. You may notice a "loose thread" at your incisions, these are normal and require no intervention. You may cut them flush to the skin with fingernail clippers if needed for comfort.  3 - Diet - No restrictions  4 - Activity - No heavy lifting / straining (any activities that require valsalva or "bearing down") x 4 weeks. Otherwise, no restrictions.  5 - Bathing - You may shower immediately. Do not take a bath or get into swimming pool where incision sites are submersed in water x 4 weeks.   6 - When to Call the Doctor - Call MD for any fever >102, any acute wound problems, or any severe nausea / vomiting. You can call the [facility] [phone]) 24 hours a day 365 days a year. It will roll-over to the answering service and on-call physician after hours.   You may resume aspirin, advil, aleve, vitamins, and supplements 7 days after surgery.  You may resume Plavix 5 days after surgery.

## 2018-05-30 NOTE — Anesthesia Postprocedure Evaluation (Signed)
Anesthesia Post Note  Patient: Charles Hart  Procedure(s) Performed: XI ROBOTIC ASSISTED LAPAROSCOPIC NEPHRECTOMY (Left )     Patient location during evaluation: PACU Anesthesia Type: General Level of consciousness: awake and alert Pain management: pain level controlled Vital Signs Assessment: post-procedure vital signs reviewed and stable Respiratory status: spontaneous breathing, nonlabored ventilation and respiratory function stable Cardiovascular status: blood pressure returned to baseline and stable Postop Assessment: no apparent nausea or vomiting Anesthetic complications: no    Last Vitals:  Vitals:   05/30/18 1630 05/30/18 1655  BP: (!) 156/60 128/60  Pulse: (!) 58 64  Resp: 18 18  Temp: 36.8 C 37.1 C  SpO2: 98% 100%                  Audry Pili

## 2018-05-30 NOTE — Brief Op Note (Signed)
05/30/2018  3:16 PM  PATIENT:  Charles Hart  62 y.o. male  PRE-OPERATIVE DIAGNOSIS:  LEFT RENAL MASS  POST-OPERATIVE DIAGNOSIS:  LEFT RENAL MASS  PROCEDURE:  Procedure(s): XI ROBOTIC ASSISTED LAPAROSCOPIC NEPHRECTOMY (Left)  SURGEON:  Surgeon(s) and Role:    Alexis Frock, MD - Primary  PHYSICIAN ASSISTANT:   ASSISTANTS: Debbrah Alar PA   ANESTHESIA:   local and general  EBL:  160mL   BLOOD ADMINISTERED:none  DRAINS: foley to gravity   LOCAL MEDICATIONS USED:  MARCAINE     SPECIMEN:  Source of Specimen:  left kidney with mass  DISPOSITION OF SPECIMEN:  PATHOLOGY  COUNTS:  YES  TOURNIQUET:  * No tourniquets in log *  DICTATION: .Other Dictation: Dictation Number [id number]   PLAN OF CARE: Admit to inpatient   PATIENT DISPOSITION:  PACU - hemodynamically stable.   Delay start of Pharmacological VTE agent (>24hrs) due to surgical blood loss or risk of bleeding: yes

## 2018-05-30 NOTE — Op Note (Signed)
NAME: Charles, Hart MEDICAL RECORD [id number] ACCOUNT [id number] DATE OF BIRTH:1955-10-14 FACILITY: [facility] LOCATION: [facility] PHYSICIAN:Lachanda Buczek, MD  OPERATIVE REPORT  DATE OF PROCEDURE:  05/30/2018  PREOPERATIVE DIAGNOSIS:   1.  Enlarging left renal mass. 2.  End-stage renal disease.  PROCEDURE PERFORMED:  Robotic-assisted laparoscopic left radical nephrectomy.  ESTIMATED BLOOD LOSS:  100 mL.  COMPLICATIONS:  None.  SPECIMENS:  Left radical nephrectomy.  SURGEON Alexis Frock, MD  ASSISTANT:  Debbrah Alar, PA  FINDINGS:   1.  Five-artery, one-vein, left renal vascular anatomy as anticipated. 2.  Approximately 50% exophytic left lower pole renal mass.  INDICATIONS:  The patient is a pleasant 62 year old gentleman with history of end-stage renal disease.  He is known to have a left renal mass for some time.  He was initially on surveillance was then lost to followup the ureter.  He reappeared several  years later with significant progression of the mass, now 6 cm and left kidney is highly concerning for localized, but nonindolent primary kidney cancer.  Options were discussed for management including surveillance protocols versus ablative therapy  versus surgical extirpation.  We both agreed on left radical nephrectomy being most definitive.  He was notably dialyzed yesterday.  Surgery being performed previously on hemodialysis off today.  Informed consent was obtained and placed in medical  record.  DESCRIPTION OF PROCEDURE:  The patient being identified as Charles Hart, procedure being left radical nephrectomy was confirmed.  Procedure timeout was performed.  Antibiotics administered.  General anesthesia induced.  The patient was laid into the  left side up full flank position, pulling 15 degrees of table flexion, superior arm elevator with  axillary roll.  Sequential compression devices, bottom leg bent, top leg straight.  He was further fastened to the table  using 3-inch tape over foam  padding across the supraxiphoid chest and his pelvis.  He was further fastened to operative table using beanbag.  His left arm fistula was very carefully padded with foam padding circumferentially and sterile field was created prepping and draping the  patient's left flank and abdomen using chlorhexidine gluconate.  Next, a high-flow, low-pressure pneumoperitoneum was obtained using Veress technique, a left lower quadrant, having passed the aspiration and drop test.  An 8 mm robotic camera port was  placed in position approximately a handbreadth superolateral to the umbilicus.  Laparoscopic examination of the peritoneal cavity revealed no significant adhesions, no visceral injury.  Additional ports were placed as follows:  Left subcostal 8 mm  robotic port, left far lateral 8 mm robotic port approximately 4 fingerbreadths superior and medial to the anterior iliac spine, left paramedian inferior robotic port approximately 1 handbreadth superior pubic ramus and two 12 mm assistant port sites in  the midline, one approximately 2 fingerbreadths above the camera port and 1 in the infraumbilical crease.  Robot was docked and passed the electronic checks.  Attention was directed at development of the retroperitoneum.  Incision was made lateral to the  descending colon, the area of the splenic flexure towards the area of the internal ring.  The colon was carefully swept medially.  Lateral splenic attachments were taken down using cautery scissors allowing the spleen to rotate medially away from the  anterior surface of Gerota's fascia.  The lower pole kidney area was identified, placed on gentle lateral traction.  Dissection proceeded medial to this.  The ureter and gonadal vessels were encountered and the psoas musculature visualized in the ureter  and gonadal vessels were placed on  gentle lateral traction.  Dissection proceeded within this triangle towards the renal hilum.  Left renal  vascular anatomy was quite complex as anticipated with a dominant lower pole artery which was controlled using an  extra-large Hem-o-Lok clip proximal, stapler distal.  A small accessory lower pole artery controlled using vascular clip to proximal to distal.  A smaller mid accessory artery controlled using small Hem-o-Lok clip proximal stapler distal.  A dominant  upper renal artery directly behind the renal vein controlled using an extra-large Hem-o-Lok clip proximal stapler distal renal vein was then controlled using vascular stapler and finally an upper accessory artery was controlled using a medium clip  proximal stapler distal.  A plane taken to also resect the left adrenal gland was chosen.  Medial adrenal attachments were taken down between it and the aorta using vascular stapler.  Superior attachments were taken down using cautery scissors as were  the lateral attachments.  The ureter was doubly clipped and ligated.  The gonadal vessels were controlled using vascular stapler.  This completely freed up the left nephrectomy specimen.  It was placed into an extra-large EndoCatch bag for later  retrieval.  All sponge, needle counts were correct.  Hemostasis appeared excellent.  Robot was undocked.  Specimen was retrieved by extending the previous assistant port sites in the midline deairing on the left side of the umbilicus and removed and left  radical nephrectomy specimen was set aside from pathology.  Extraction site was closed with fascia using figure-of-eight PDS x6 followed by reapproximation of Scarpa's with a running Vicryl.  All incision sites were infiltrated with dilute likewise  Marcaine and closed level skin using subcuticular Monocryl and Dermabond.    The patient tolerated the procedure terminated.  The patient tolerated the procedure well.  No immediate complications.  The patient was taken to the postanesthesia care in stable condition with plan for stat labs and a nephrology evaluation  for plan for  postoperative dialysis.  Please note, first assistant Debbrah Alar was crucial for all portions of the procedure today.  She provided invaluable first assistance, retraction, vascular clipping, vascular stapling, and specimen manipulation.  AN/NUANCE  D:05/30/2018 T:05/30/2018 JOB:[id number]

## 2018-05-30 NOTE — Consult Note (Signed)
Renal Service Consult Note [facility]  Charles Hart 05/30/2018 Charles Hart Requesting Physician:  Dr Charles Hart  Reason for Consult:  ESRD pt sp nephrectomy surgery today HPI: The patient is a 62 y.o. year-old with hx of CVA, DM2, HTN, ESRD on HD < 6 mos, hx combined CHF, HL, CAD who was admitted today for elective L nephrectomy for enlargement of known renal mass.   Asked to see for HD tomorrow. Pt seen in pacu, groggy but responsive.  No c/o.  Not providing much history due to sedation.   Patient was started on HD earlier this year.  Cause of ESRD most likely was DM and HTN.     ROS  denies CP  no joint pain   no HA  no blurry vision  no rash  no diarrhea  no N/V   Past Medical History  Past Medical History:  Diagnosis Date  . Adenomatous colon polyp 07/02/2011   Last colonoscopy May 06, 2011 by Dr. Owens Hart, who recommended repeat colonoscopy in 5 years.   . Anemia   . Background diabetic retinopathy 04/20/2012   Patient is followed by Dr. Katy Hart   . Cardiomyopathy    LV function improved from 2004 to 2008.  Historically, moderately dilated LV with EF 30-40% by 2D echo 08/14/2002.  Mild CAD with severe LV dysfunction by cardiac cath 09/2002.  Normal coronary arteries and normal LV function by cardiac cath 09/19/2006.  A 2-D echo on 04/01/2009 showed mild concentric hypertrophy and normal systolic (LVEF  29-52%) and doppler C/W with grade 1 diastolic dysfunction.  . CHF (congestive heart failure) ([place])    LV function improved from 2004 to 2008.  Historically, moderately dilated LV with EF 30-40% by 2D echo 08/14/2002.  Mild CAD with severe LV dysfunction by cardiac cath 09/2002.  Normal coronary arteries and normal LV function by cardiac cath 09/19/2006.  A 2-D echo on 04/01/2009 showed mild concentric hypertrophy and normal systolic (LVEF  84-13%) and doppler C/W with grade 1 diastolic dysfunction..   . Chronic combined systolic and diastolic congestive  heart failure ([place]) 05/21/2010   LV function improved from 2004 to 2008.  Historically, moderately dilated LV with EF 30-40% by 2D echo 08/14/2002.  Mild CAD with severe LV dysfunction by cardiac cath 09/2002.  Normal coronary arteries and normal LV function by cardiac cath 09/19/2006.  A 2-D echo on 04/01/2009 showed mild concentric hypertrophy and normal systolic (LVEF  24-40%) and doppler parameters consistent with abnormal left   . CVA (cerebral vascular accident) ([place]) 07/04/2012   MRI of the brain 07/04/2012 showed an acute infarct in the right basal ganglia involving the anterior putamen, anterior limb internal capsule, and head of the caudate; this measured approximately 2.5 cm in diameter.     . Dermatitis   . Diabetes mellitus    type 2  . DIABETIC PERIPHERAL NEUROPATHY 08/03/2007   Qualifier: Diagnosis of  Charles Hart    . DM neuropathy, painful (HCC)    fingers and right knee  . ESRD (end stage renal disease) ([place])    Stage 4, on hemodailysis x 4 months as of 05-18-18 [place] fresenius, tues thurs sat  . Hearing loss in right ear   . Hyperlipidemia   . Hypertension   . Hypertensive crisis 07/28/2012  . Hypertensive urgency 08/20/2014  . Myocardial infarction [facility])  many yrs ago  . Nephrotic syndrome 02/18/2013   A 24-hour urine collection 03/04/2013 showed total protein of 5,460  g and creatinine clearance of 80 mL/minute.  Patient was seen by Charles Hart at [place] and a repeat 24-hour urine showed 10,407 mg protein.  Patient underwent kidney biopsy on 05/30/2013; pathology showed advanced diffuse and nodular diabetic nephropathy with vascular changes consistent with long-standing difficult to control hypertension.     . Pneumonia 2014 and 2015  . Renal insufficiency   . Sleep apnea    uses cpap "sometimes per pt"   Past Surgical History  Past Surgical History:  Procedure Laterality Date  . AV FISTULA PLACEMENT Left 11/24/2017    Procedure: INSERTION OF ARTERIOVENOUS (AV) GORE-TEX GRAFT LEFT LOWER ARM;  Surgeon: Charles Posner, Hart;  Location: [facility];  Service: Vascular;  Laterality: Left;  . CARDIAC CATHETERIZATION      4 times  . COLONOSCOPY    . FOOT SURGERY    . POLYPECTOMY     Family History  Family History  Problem Relation Age of Onset  . Aneurysm Father 36       died of rupture  . Colon cancer Sister    Social History  reports that he has never smoked. He has never used smokeless tobacco. He reports that he does not drink alcohol or use drugs. Allergies  Allergies  Allergen Reactions  . Ivp Dye [Iodinated Diagnostic Agents] Other (See Comments)    Per patient's Nephrologist, he doesn't want the patient exposed to ANY dye because of issues with his kidneys  . Hydrocodone     Caused hands and feet to peel   . Amlodipine Swelling  . Lisinopril Cough  . Red Dye Other (See Comments)    NO dye of any kind (issues with his kidneys)   Home medications Prior to Admission medications   Medication Sig Start Date End Date Taking? Authorizing Provider  B Complex-C-Folic Acid (DIALYVITE TABLET) TABS Take 1 tablet by mouth daily. 04/27/18  Yes Charles Hart  calcium acetate (PHOSLO) 667 MG capsule Take 1,334 mg by mouth 3 (three) times daily with meals.  12/15/17  Yes Charles Hart  CARTIA XT 180 MG 24 hr capsule TAKE 1 CAPSULE(180 MG) BY MOUTH DAILY Patient taking differently: Take 180 mg by mouth daily. TAKE 1 CAPSULE(180 MG) BY MOUTH DAILY 09/07/17  Yes Charles Hart  carvedilol (COREG) 25 MG tablet Take 25 mg by mouth 2 (two) times daily with a meal.  09/06/17  Yes Charles Hart  clopidogrel (PLAVIX) 75 MG tablet Take 1 tablet (75 mg total) by mouth daily. 09/07/17  Yes Charles Hart  fluticasone (FLONASE) 50 MCG/ACT nasal spray Place 2 sprays daily into both nostrils. Patient taking differently: Place 2 sprays into both nostrils as needed.  07/01/17  Yes Charles Hart   furosemide (LASIX) 80 MG tablet Take 80 mg by mouth 2 (two) times daily.   Yes Charles Hart  gabapentin (NEURONTIN) 300 MG capsule Take 300 mg by mouth at bedtime.   Yes Charles Hart  hydrALAZINE (APRESOLINE) 100 MG tablet Take 1 tablet (100 mg total) by mouth 3 (three) times daily. 09/07/17 09/16/18 Yes Charles Hart  lidocaine (LIDODERM) 5 % Place 1 patch onto the skin daily. Remove & Discard patch within 12 hours or as directed by Hart 04/25/18 04/25/19 Yes Lorella Nimrod, Hart  liraglutide (VICTOZA) 18 MG/3ML SOPN Inject 0.1 mLs (0.6 mg total) into the skin daily. 12/07/17  Yes Charles Hart  Multiple Vitamins-Minerals (MULTIVITAMIN ADULT PO)  Take 1 tablet by mouth daily.    Yes Charles Hart  Omega-3 Fatty Acids (FISH OIL) 1000 MG CAPS Take 1,000 mg by mouth daily.   Yes Charles Hart  OVER THE COUNTER MEDICATION Take 3 capsules by mouth daily before breakfast. Med Name: Nugenix   Yes Charles Hart  pravastatin (PRAVACHOL) 40 MG tablet Take 1 tablet (40 mg total) by mouth daily. 09/07/17  Yes Charles Hart  promethazine (PHENERGAN) 25 MG tablet Take 1 tablet (25 mg total) by mouth every 6 (six) hours as needed for nausea or vomiting. 12/07/17  Yes Charles Hart  traMADol (ULTRAM) 50 MG tablet Take 1 tablet (50 mg total) by mouth every 12 (twelve) hours as needed. 05/02/18 05/02/19 Yes Lorella Nimrod, Hart  HYDROcodone-acetaminophen (NORCO/VICODIN) 5-325 MG tablet Take 1 tablet by mouth every 4 (four) hours as needed. Patient not taking: Reported on 05/11/2018 04/21/18   Evalee Jefferson, PA-C  HYDROcodone-acetaminophen (NORCO/VICODIN) 5-325 MG tablet Take 1-2 tablets by mouth every 4 (four) hours as needed for moderate pain. 05/30/18   Debbrah Alar, PA-C  Insulin Pen Needle (NOVOFINE PLUS) 32G X 4 MM MISC 1 Dose by Does not apply route daily. For Victoza, substitution ok for cheaper brand 09/08/17   Charles Hart   Liver Function  Tests No results for input(s): AST, ALT, ALKPHOS, BILITOT, PROT, ALBUMIN in the last 168 hours. No results for input(s): LIPASE, AMYLASE in the last 168 hours. CBC Recent Labs  Lab 05/30/18 0950 05/30/18 1559  WBC 5.4  --   HGB 13.0 12.1*  HCT 39.7 38.4*  MCV 88.2  --   PLT 254  --    Basic Metabolic Panel Recent Labs  Lab 05/30/18 0950 05/30/18 1559  NA 141 142  K 3.3* 3.3*  CL 98 99  CO2 30 31  GLUCOSE 100* 137*  BUN 31* 35*  CREATININE 5.33* 6.05*  CALCIUM 9.4 8.5*   Iron/TIBC/Ferritin/ %Sat    Component Value Date/Time   IRON 24 (L) 07/12/2013 0522   TIBC 256 07/12/2013 0522   FERRITIN 246 11/04/2014 1202   IRONPCTSAT 9 (L) 07/12/2013 0522    Vitals:   05/30/18 1600 05/30/18 1615 05/30/18 1630 05/30/18 1655  BP: (!) 146/63 (!) 160/61 (!) 156/60 128/60  Pulse: (!) 56 (!) 57 (!) 58 64  Resp: 20 (!) 25 18 18   Temp:   98.2 F (36.8 C) 98.7 F (37.1 C)  TempSrc:    Oral  SpO2: 98% 91% 98% 100%  Weight:      Height:       Exam Gen groggy in postop area,  nauseous No rash, cyanosis or gangrene Sclera anicteric, throat clear  No jvd or bruits Chest clear bilat RRR no MRG Abd soft ntnd no mass or ascites +bs, stapled wound in SP area GU normal male MS no joint effusions or deformity Ext no LE or UE edema, no wounds or ulcers Neuro is alert, Ox 3 , nf L forearm aVG +bruit    Home meds:  - diltiazem xt 180 qd/ carvedilol 25 bid/ furosemide 80 bid/ hydralazine 100 tid  - liraglutide 0.6 mg sq qd  - clopidogrel 75 qd/ pravastatin 40 qd  - gabapentin 300 hs/ prn tramadol / prn norco  - calc acetate tid ac/ prn's / vitamins   Dialysis: TTS [facility]   4h   92.5kg   2/2.5 bath  LFA AVG   Hep 3000   Impression/ Plan: 1.  SP L nephrectomy - for enlarging mass, presume RCC 2. ESRD - on HD TTS. Plan HD tomorrow at [facility]  3. Volume - stable on exam, wt's pending 4. HTN - stable, cont BP meds x 3 5. DM2 - per primary 6. CVA - on plavix 7. Anemia ckd -  Hb > 12, no esa     Kelly Splinter Hart [employer] pager [phone]   05/30/2018, 5:10 PM

## 2018-05-30 NOTE — H&P (Signed)
Charles Hart is an 62 y.o. male.    Chief Complaint: Pre-Op LEFT Radical Nephrectomy  HPI:   1 - Enlarging Left Renal Mass - 3.5cm left mid sold mass by imaging 2018 and lost to follow up. Repeat CT 04/2018 with sig progression to 6cm mid lateral in background of few small cysts. 5 ARTERY (2 upper small accesory, early branging upper man, single lower main, single lower acessory) / 1 vein left renovascular anatomy.   2 - End Stage Renal Disease - on hemosialysis TTS through Left forarm fistula. Follows Charles Mcgregor MD with [facility].   PMH sig for DM2 with neuropathy (A1c now 6's), CAD/MI (medical management by Charles Rouge MD, cards). His PCP is Charles Hart.   Today "Charles Hart" is seen to proceed with LEFT radical nephrectomy. NO interval fevers.    Past Medical History:  Diagnosis Date  . Adenomatous colon polyp 07/02/2011   Last colonoscopy May 06, 2011 by Dr. Owens Loffler, who recommended repeat colonoscopy in 5 years.   . Anemia   . Background diabetic retinopathy 04/20/2012   Patient is followed by Dr. Katy Fitch   . Cardiomyopathy    LV function improved from 2004 to 2008.  Historically, moderately dilated LV with EF 30-40% by 2D echo 08/14/2002.  Mild CAD with severe LV dysfunction by cardiac cath 09/2002.  Normal coronary arteries and normal LV function by cardiac cath 09/19/2006.  A 2-D echo on 04/01/2009 showed mild concentric hypertrophy and normal systolic (LVEF  17-61%) and doppler C/W with grade 1 diastolic dysfunction.  . CHF (congestive heart failure) ([place])    LV function improved from 2004 to 2008.  Historically, moderately dilated LV with EF 30-40% by 2D echo 08/14/2002.  Mild CAD with severe LV dysfunction by cardiac cath 09/2002.  Normal coronary arteries and normal LV function by cardiac cath 09/19/2006.  A 2-D echo on 04/01/2009 showed mild concentric hypertrophy and normal systolic (LVEF  60-73%) and doppler C/W with grade 1 diastolic dysfunction..   . Chronic  combined systolic and diastolic congestive heart failure ([place]) 05/21/2010   LV function improved from 2004 to 2008.  Historically, moderately dilated LV with EF 30-40% by 2D echo 08/14/2002.  Mild CAD with severe LV dysfunction by cardiac cath 09/2002.  Normal coronary arteries and normal LV function by cardiac cath 09/19/2006.  A 2-D echo on 04/01/2009 showed mild concentric hypertrophy and normal systolic (LVEF  71-06%) and doppler parameters consistent with abnormal left   . CVA (cerebral vascular accident) ([place]) 07/04/2012   MRI of the brain 07/04/2012 showed an acute infarct in the right basal ganglia involving the anterior putamen, anterior limb internal capsule, and head of the caudate; this measured approximately 2.5 cm in diameter.     . Dermatitis   . Diabetes mellitus    type 2  . DIABETIC PERIPHERAL NEUROPATHY 08/03/2007   Qualifier: Diagnosis of  By: Charles Elk MD, Charles Hart    . DM neuropathy, painful (HCC)    fingers and right knee  . ESRD (end stage renal disease) ([place])    Stage 4, on hemodailysis x 4 months as of 05-18-18 [place] fresenius, tues thurs sat  . Hearing loss in right ear   . Hyperlipidemia   . Hypertension   . Hypertensive crisis 07/28/2012  . Hypertensive urgency 08/20/2014  . Myocardial infarction [facility])  many yrs ago  . Nephrotic syndrome 02/18/2013   A 24-hour urine collection 03/04/2013 showed total protein of 5,460 g and creatinine clearance of 80 mL/minute.  Patient was seen by Seward Meth at [place] and a repeat 24-hour urine showed 10,407 mg protein.  Patient underwent kidney biopsy on 05/30/2013; pathology showed advanced diffuse and nodular diabetic nephropathy with vascular changes consistent with long-standing difficult to control hypertension.     . Pneumonia 2014 and 2015  . Renal insufficiency   . Sleep apnea    uses cpap "sometimes per pt"    Past Surgical History:  Procedure Laterality Date  . AV FISTULA PLACEMENT Left  11/24/2017   Procedure: INSERTION OF ARTERIOVENOUS (AV) GORE-TEX GRAFT LEFT LOWER ARM;  Surgeon: Charles Posner, MD;  Location: [facility];  Service: Vascular;  Laterality: Left;  . CARDIAC CATHETERIZATION      4 times  . COLONOSCOPY    . FOOT SURGERY    . POLYPECTOMY      Family History  Problem Relation Age of Onset  . Aneurysm Father 40       died of rupture  . Colon cancer Sister    Social History:  reports that he has never smoked. He has never used smokeless tobacco. He reports that he does not drink alcohol or use drugs.  Allergies:  Allergies  Allergen Reactions  . Ivp Dye [Iodinated Diagnostic Agents] Other (See Comments)    Per patient's Nephrologist, he doesn't want the patient exposed to ANY dye because of issues with his kidneys  . Amlodipine Swelling  . Lisinopril Cough  . Red Dye Other (See Comments)    NO dye of any kind (issues with his kidneys)    No medications prior to admission.    No results found for this or any previous visit (from the past 48 hour(s)). No results found.  Review of Systems  Constitutional: Negative.   HENT: Negative.   Eyes: Negative.   Respiratory: Negative.   Cardiovascular: Negative.  Negative for chest pain.  Gastrointestinal: Negative.   Genitourinary: Negative.   Musculoskeletal: Negative.   Skin: Negative.   Neurological: Negative.   Endo/Heme/Allergies: Negative.   Psychiatric/Behavioral: Negative.     There were no vitals taken for this visit. Physical Exam  Constitutional: He appears well-developed.  HENT:  Head: Normocephalic.  Eyes: Pupils are equal, round, and reactive to light.  Neck: Normal range of motion.  Cardiovascular: Normal rate.  Respiratory: Effort normal.  GI: Soft.  Genitourinary:  Genitourinary Comments: No CVAT  Musculoskeletal: Normal range of motion.  Left forearm AVF with thrill  Skin: Skin is warm.  Psychiatric: He has a normal mood and affect.     Assessment/Plan  Proceed as planned  with LEFT radical nephrectomy for enlarging mass. Risks, benefits, alternatives, expected peri-op course discussed previously and reiterated today. Nephrology opinion post-op to determine best post-op dialysis management.   Alexis Frock, MD 05/30/2018, 7:20 AM

## 2018-05-30 NOTE — Anesthesia Preprocedure Evaluation (Addendum)
Anesthesia Evaluation  Patient identified by MRN, date of birth, ID band Patient awake    Reviewed: Allergy & Precautions, NPO status , Patient's Chart, lab work & pertinent test results  History of Anesthesia Complications Negative for: history of anesthetic complications  Airway Mallampati: II  TM Distance: >3 FB Neck ROM: Full    Dental  (+) Edentulous Lower, Edentulous Upper   Pulmonary sleep apnea and Continuous Positive Airway Pressure Ventilation ,    breath sounds clear to auscultation       Cardiovascular hypertension, Pt. on medications and Pt. on home beta blockers (-) angina+ CAD, + Past MI and +CHF   Rhythm:Regular Rate:Normal   '19 TTE - LV cavity size was mildly dilated. Mild LVH. EF 35% to 40%. Diffuse hypokinesis. Aortic root dimension: 44 mm (ED). The ascending aorta was mildly dilated. Left atrium was mildly dilated. RV cavity size was moderately dilated. Right atrium was mildly dilated.  '18 Carotid US - 40-59% left ICAS    Neuro/Psych CVA, No Residual Symptoms negative psych ROS   GI/Hepatic negative GI ROS, Neg liver ROS,   Endo/Other  diabetes, Well Controlled, Type 2  Renal/GU ESRF and DialysisRenal disease     Musculoskeletal  (+) Arthritis ,   Abdominal   Peds  Hematology  (+) anemia ,   Anesthesia Other Findings   Reproductive/Obstetrics                           Anesthesia Physical Anesthesia Plan  ASA: III  Anesthesia Plan: General   Post-op Pain Management:    Induction: Intravenous  PONV Risk Score and Plan: 3 and Treatment may vary due to age or medical condition, Ondansetron and Dexamethasone  Airway Management Planned: Oral ETT  Additional Equipment:   Intra-op Plan:   Post-operative Plan: Extubation in OR  Informed Consent: I have reviewed the patients History and Physical, chart, labs and discussed the procedure including the risks,  benefits and alternatives for the proposed anesthesia with the patient or authorized representative who has indicated his/her understanding and acceptance.   Dental advisory given  Plan Discussed with: CRNA and Anesthesiologist  Anesthesia Plan Comments: (Consented for arterial line, which will be placed intraoperatively as deemed necessary.)       Anesthesia Quick Evaluation

## 2018-05-31 ENCOUNTER — Encounter (HOSPITAL_COMMUNITY): Payer: Self-pay | Admitting: Urology

## 2018-05-31 LAB — BASIC METABOLIC PANEL
ANION GAP: 17 — AB (ref 5–15)
BUN: 49 mg/dL — ABNORMAL HIGH (ref 8–23)
CALCIUM: 8.5 mg/dL — AB (ref 8.9–10.3)
CHLORIDE: 97 mmol/L — AB (ref 98–111)
CO2: 27 mmol/L (ref 22–32)
Creatinine, Ser: 7.66 mg/dL — ABNORMAL HIGH (ref 0.61–1.24)
GFR calc non Af Amer: 7 mL/min — ABNORMAL LOW (ref >60)
GFR, EST AFRICAN AMERICAN: 8 mL/min — AB (ref >60)
Glucose, Bld: 158 mg/dL — ABNORMAL HIGH (ref 70–99)
Potassium: 3.9 mmol/L (ref 3.5–5.1)
Sodium: 141 mmol/L (ref 135–145)

## 2018-05-31 LAB — TYPE AND SCREEN
ABO/RH(D): A POS
ANTIBODY SCREEN: NEGATIVE

## 2018-05-31 LAB — GLUCOSE, CAPILLARY
GLUCOSE-CAPILLARY: 122 mg/dL — AB (ref 70–99)
Glucose-Capillary: 124 mg/dL — ABNORMAL HIGH (ref 70–99)
Glucose-Capillary: 104 mg/dL — ABNORMAL HIGH (ref 70–99)

## 2018-05-31 LAB — HEMOGLOBIN AND HEMATOCRIT, BLOOD
HEMATOCRIT: 34.5 % — AB (ref 39.0–52.0)
Hemoglobin: 10.6 g/dL — ABNORMAL LOW (ref 13.0–17.0)

## 2018-05-31 NOTE — Progress Notes (Addendum)
Urology Progress Note   1 Day Post-Op s/p Left robotic radical nephrectomy  Subjective: Soft Bps to 85 systolic overnight, normal HR, asymptomatic. BP 98/64 this AM UOP expectedly low with ESRD on HD Hgb stable at 12.1 Potassium wnl this morning Pain controlled, tolerated clear liquids  Objective: Vital signs in last 24 hours: Temp:  [98 F (36.7 C)-99.3 F (37.4 C)] 98.7 F (37.1 C) (10/17 0700) Pulse Rate:  [56-92] 84 (10/17 0700) Resp:  [16-25] 20 (10/17 0700) BP: (85-162)/(51-109) 98/64 (10/17 0700) SpO2:  [91 %-100 %] 98 % (10/17 0700) Weight:  [90 kg] 90 kg (10/16 1011)  Intake/Output from previous day: 10/16 0701 - 10/17 0700 In: 1208.6 [P.O.:555; I.V.:603.6; IV Piggyback:50] Out: 50 [Urine:50] Intake/Output this shift: No intake/output data recorded.  Physical Exam:  General: Alert and oriented CV: RRR Lungs: Clear Abdomen: Soft, appropriately tender. Port incisions and larger extraction site incision all clean dry and intact with surgical glue GU: Foley in place draining scant yellow urine Ext: NT, No erythema  Lab Results: Recent Labs    05/30/18 0950 05/30/18 1559 05/31/18 0547  HGB 13.0 12.1* 10.6*  HCT 39.7 38.4* 34.5*   BMET Recent Labs    05/30/18 1559 05/31/18 0547  NA 142 141  K 3.3* 3.9  CL 99 97*  CO2 31 27  GLUCOSE 137* 158*  BUN 35* 49*  CREATININE 6.05* 7.66*  CALCIUM 8.5* 8.5*     Studies/Results: No results found.  Assessment/Plan:  62 y.o. male s/p Left robotic radical nephrectomy on 05/31/18.  Overall doing well post-op.   - medlock and regular diet - Nephrology following. Plan to transfer to [facility] today for HD session, anticipate monitoring in house one more night - Discontinue foley catheter - OOBTC, ambulation and incentive spirometry - Am lab work   Dispo: floor, transfer to [facility] today for HD   LOS: 1 day   Fredricka Bonine 05/31/2018, 7:24 AM   I have seen and examined the patient and agree with Dr.  Wayland Denis plan.  Briefly,  S: POD 1 s/p LEFT nephrectomy for enlarging mass. ESRD vial left arm AVF TTS hemodialysis at baseline.  O: NAD, AOx3, pain controlled Non-labored breathing Reg HR Left arm AVF with thrisll REcent surgical sites c/d/i, scant serosanguins spotting from ingerior edge of extraction site (expected) SCD's in place  K<4, Hgb >10.  A/P: Doing very well POD 1 after nephrectomy despite his significant comorbidity.  SLIV, [facility], and to remian at [facility] [place] home tomorrow based on current progress.

## 2018-05-31 NOTE — Progress Notes (Signed)
[facility] Progress Note  Subjective: in process of transport to [facility], no c/o  Vitals:   05/31/18 0345 05/31/18 0700 05/31/18 1414 05/31/18 1618  BP: 90/60 98/64 (!) 75/45 121/65  Pulse:  84 66 64  Resp:  20 20   Temp:  98.7 F (37.1 C) 98.2 F (36.8 C)   TempSrc:  Oral Oral   SpO2:  98% 94%   Weight:      Height:        Inpatient medications: . calcium acetate  1,334 mg Oral TID WC  . carvedilol  25 mg Oral BID WC  . Chlorhexidine Gluconate Cloth  6 each Topical Q0600  . gabapentin  300 mg Oral QHS  . insulin aspart  0-15 Units Subcutaneous TID WC  . pravastatin  40 mg Oral Daily    diphenhydrAMINE **OR** diphenhydrAMINE, HYDROmorphone (DILAUDID) injection, ondansetron, oxyCODONE  Iron/TIBC/Ferritin/ %Sat    Component Value Date/Time   IRON 24 (L) 07/12/2013 0522   TIBC 256 07/12/2013 0522   FERRITIN 246 11/04/2014 1202   IRONPCTSAT 9 (L) 07/12/2013 0522    Exam: Gen groggy in postop area,  nauseous No rash, cyanosis or gangrene Sclera anicteric, throat clear  No jvd or bruits Chest clear bilat RRR no MRG Abd soft ntnd no mass or ascites +bs, stapled wound in SP area GU normal male MS no joint effusions or deformity Ext no LE or UE edema, no wounds or ulcers Neuro is alert, Ox 3 , nf L forearm aVG +bruit    Home meds:  - diltiazem xt 180 qd/ carvedilol 25 bid/ furosemide 80 bid/ hydralazine 100 tid  - liraglutide 0.6 mg sq qd  - clopidogrel 75 qd/ pravastatin 40 qd  - gabapentin 300 hs/ prn tramadol / prn norco  - calc acetate tid ac/ prn's / vitamins   Dialysis: TTS [place]   4h   92.5kg   2/2.5 bath  LFA AVG   Hep 3000   Impression/ Plan: 1. SP L nephrectomy 05/30/18 - for enlarging mass, presumed RCC 2. ESRD - on HD TTS. For HD tonight at [facility] 3. Volume - no vol excess on exam 4. HTN - bp's dropped due to 3 BP meds.  Have dc'd 2 of 3, cont coreg 25 bid for now 5. DM2 - per primary 6. H/o CVA - on plavix 7. Anemia  ckd - Hb > 12, no esa   Kelly Splinter MD [facility] pager [phone]   05/31/2018, 5:04 PM   Recent Labs  Lab 05/30/18 1559 05/31/18 0547  NA 142 141  K 3.3* 3.9  CL 99 97*  CO2 31 27  GLUCOSE 137* 158*  BUN 35* 49*  CREATININE 6.05* 7.66*  CALCIUM 8.5* 8.5*   No results for input(s): AST, ALT, ALKPHOS, BILITOT, PROT in the last 168 hours. Recent Labs  Lab 05/30/18 0950 05/30/18 1559 05/31/18 0547  WBC 5.4  --   --   HGB 13.0 12.1* 10.6*  HCT 39.7 38.4* 34.5*  MCV 88.2  --   --   PLT 254  --   --

## 2018-05-31 NOTE — Progress Notes (Signed)
Patient blood pressure reading 85/51 manually at 0235. Patient asymptomatic and HR resting in 70s. Urine output 46mls this shift. Provider, Natividad Brood, MD  notified and advised to monitor HR and page if pt becomes symptomatic. Will continue to monitor.

## 2018-05-31 NOTE — Progress Notes (Signed)
New Admission Note:   Arrival Method: Bed PTAR  Mental Orientation: alert and oriented x4  Telemetry: Assessment: Completed Skin: Intact , 5 incision on abdomen  IV: Right hand and right foearm  Pain: 5/10 dilaudid was given before transfer  Tubes: Safety Measures: Safety Fall Prevention Plan has been given, discussed and signed Admission: Completed [facility] Orientation: Patient has been orientated to the room, unit and staff.  Family: Daughter and Friend   Orders have been reviewed and implemented. Will continue to monitor the patient. Call light has been placed within reach and bed alarm has been activated.   Brice Potteiger RN [facility] Phone: [phone]

## 2018-06-01 LAB — RENAL FUNCTION PANEL
ALBUMIN: 3.9 g/dL (ref 3.5–5.0)
ANION GAP: 16 — AB (ref 5–15)
BUN: 58 mg/dL — ABNORMAL HIGH (ref 8–23)
CALCIUM: 8.1 mg/dL — AB (ref 8.9–10.3)
CO2: 27 mmol/L (ref 22–32)
CREATININE: 9.88 mg/dL — AB (ref 0.61–1.24)
Chloride: 91 mmol/L — ABNORMAL LOW (ref 98–111)
GFR calc Af Amer: 6 mL/min — ABNORMAL LOW (ref >60)
GFR calc non Af Amer: 5 mL/min — ABNORMAL LOW (ref >60)
Glucose, Bld: 126 mg/dL — ABNORMAL HIGH (ref 70–99)
PHOSPHORUS: 8.7 mg/dL — AB (ref 2.5–4.6)
Potassium: 3.7 mmol/L (ref 3.5–5.1)
SODIUM: 134 mmol/L — AB (ref 135–145)

## 2018-06-01 LAB — GLUCOSE, CAPILLARY
GLUCOSE-CAPILLARY: 134 mg/dL — AB (ref 70–99)
Glucose-Capillary: 104 mg/dL — ABNORMAL HIGH (ref 70–99)
Glucose-Capillary: 111 mg/dL — ABNORMAL HIGH (ref 70–99)
Glucose-Capillary: 121 mg/dL — ABNORMAL HIGH (ref 70–99)

## 2018-06-01 LAB — CBC
HCT: 28.8 % — ABNORMAL LOW (ref 39.0–52.0)
HEMOGLOBIN: 8.8 g/dL — AB (ref 13.0–17.0)
MCH: 28.1 pg (ref 26.0–34.0)
MCHC: 30.6 g/dL (ref 30.0–36.0)
MCV: 92 fL (ref 80.0–100.0)
NRBC: 0 % (ref 0.0–0.2)
PLATELETS: 185 10*3/uL (ref 150–400)
RBC: 3.13 MIL/uL — AB (ref 4.22–5.81)
RDW: 13.3 % (ref 11.5–15.5)
WBC: 11.8 10*3/uL — AB (ref 4.0–10.5)

## 2018-06-01 MED ORDER — ONDANSETRON HCL 4 MG PO TABS
4.0000 mg | ORAL_TABLET | Freq: Three times a day (TID) | ORAL | Status: DC | PRN
  Administered 2018-06-01: 4 mg via ORAL
  Filled 2018-06-01: qty 1

## 2018-06-01 MED ORDER — SENNA 8.6 MG PO TABS: 1.0000 | ORAL_TABLET | Freq: Two times a day (BID) | ORAL | 0 refills | Status: DC

## 2018-06-01 MED ORDER — HYDROMORPHONE HCL 1 MG/ML IJ SOLN
INTRAMUSCULAR | Status: AC
  Administered 2018-06-01: 1 mg via INTRAVENOUS
  Filled 2018-06-01: qty 1

## 2018-06-01 NOTE — Discharge Summary (Signed)
Physician Discharge Summary  Patient ID: Charles Hart MRN: [id number] DOB/AGE: [birth year] 62 y.o.  Admit date: 05/30/2018 Discharge date: 06/01/2018  Admission Diagnoses:  LEFT Renal Mass  Discharge Diagnoses:  Active Problems:   Renal mass   Discharged Condition: fair  Hospital Course: Pt underwent LEFT robotic radical nephrectomy on 05/30/18, the day of admission, withtou acute complications at [facility]. POD 1 he was transferred to [facility] for scheduled hemodialysis under the care of the nephrology team. By the afternoon of POD 2, the day of discharge, he is ambulatory, pain controlled on PO meds, maintaining PO nutrition, and felt to be adequate for discharge. Final path pending at discharge. DC Hgb > 8.   Consults: nephrology  Significant Diagnostic Studies: labs: as per above  Treatments: surgery: as per above  Discharge Exam: Blood pressure (!) 155/71, pulse 98, temperature 99.2 F (37.3 C), temperature source Oral, resp. rate 18, height 6' (1.829 m), weight 93.3 kg, SpO2 90 %. General appearance: alert, cooperative, appears stated age and family in room  Eyes: negative Nose: Nares normal. Septum midline. Mucosa normal. No drainage or sinus tenderness. Throat: lips, mucosa, and tongue normal; teeth and gums normal Neck: supple, symmetrical, trachea midline Back: symmetric, no curvature. ROM normal. No CVA tenderness. Resp: non-labored on room air Cardio: Nl rate GI: soft, non-tender; bowel sounds normal; no masses,  no organomegaly Extremities: extremities normal, atraumatic, no cyanosis or edema Lymph nodes: Cervical, supraclavicular, and axillary nodes normal. Neurologic: Grossly normal Incision/Wound: recent port and extraction sites c/d/i. No hernias. LUE AVF with thrill.   Disposition:    Allergies as of 06/01/2018      Reactions   Ivp Dye [iodinated Diagnostic Agents] Other (See Comments)   Per patient's Nephrologist, he doesn't want  the patient exposed to ANY dye because of issues with his kidneys   Hydrocodone    Caused hands and feet to peel    Amlodipine Swelling   Lisinopril Cough   Red Dye Other (See Comments)   NO dye of any kind (issues with his kidneys)      Medication List    STOP taking these medications   clopidogrel 75 MG tablet Commonly known as:  PLAVIX   DIALYVITE TABLET Tabs   Fish Oil 1000 MG Caps   MULTIVITAMIN ADULT PO   OVER THE COUNTER MEDICATION   traMADol 50 MG tablet Commonly known as:  ULTRAM     TAKE these medications   calcium acetate 667 MG capsule Commonly known as:  PHOSLO Take 1,334 mg by mouth 3 (three) times daily with meals.   CARTIA XT 180 MG 24 hr capsule Generic drug:  diltiazem TAKE 1 CAPSULE(180 MG) BY MOUTH DAILY What changed:    how much to take  how to take this  when to take this   carvedilol 25 MG tablet Commonly known as:  COREG Take 25 mg by mouth 2 (two) times daily with a meal.   fluticasone 50 MCG/ACT nasal spray Commonly known as:  FLONASE Place 2 sprays daily into both nostrils. What changed:    when to take this  reasons to take this   furosemide 80 MG tablet Commonly known as:  LASIX Take 80 mg by mouth 2 (two) times daily.   gabapentin 300 MG capsule Commonly known as:  NEURONTIN Take 300 mg by mouth at bedtime.   hydrALAZINE 100 MG tablet Commonly known as:  APRESOLINE Take 1 tablet (100 mg total) by mouth 3 (three) times  daily.   HYDROcodone-acetaminophen 5-325 MG tablet Commonly known as:  NORCO/VICODIN Take 1-2 tablets by mouth every 4 (four) hours as needed for moderate pain. What changed:    how much to take  reasons to take this  Another medication with the same name was removed. Continue taking this medication, and follow the directions you see here.   Insulin Pen Needle 32G X 4 MM Misc 1 Dose by Does not apply route daily. For Victoza, substitution ok for cheaper brand   lidocaine 5 % Commonly known  as:  LIDODERM Place 1 patch onto the skin daily. Remove & Discard patch within 12 hours or as directed by MD   liraglutide 18 MG/3ML Sopn Commonly known as:  VICTOZA Inject 0.1 mLs (0.6 mg total) into the skin daily.   pravastatin 40 MG tablet Commonly known as:  PRAVACHOL Take 1 tablet (40 mg total) by mouth daily.   promethazine 25 MG tablet Commonly known as:  PHENERGAN Take 1 tablet (25 mg total) by mouth every 6 (six) hours as needed for nausea or vomiting.   senna 8.6 MG Tabs tablet Commonly known as:  SENOKOT Take 1 tablet (8.6 mg total) by mouth 2 (two) times daily. While taking strong pain meds to prevent constipation      Follow-up Information    Alexis Frock, MD On 06/18/2018.   Specialty:  Urology Why:  at 10:30 AM for MD visit Contact information: [address] [place] [address] [phone]           Signed: Alexis Frock 06/01/2018, 5:04 PM

## 2018-06-01 NOTE — Progress Notes (Signed)
HD tx completed w/o problem other than pain @ 0455, UF goal of keep even met, blood rinsed back, VSS, prn pain med was held during tx as bp readings were all over the place, pt was asymptomatic throughout but I didn't want to chance bottoming out his bp, prn pain med admin @ 0510 prior to decannulation, VSS, report called to Percell Boston, RN

## 2018-06-01 NOTE — Consult Note (Signed)
   [facility] Inpatient Consult   06/01/2018  Charles Hart 05/20/1956 [id number]   Patient was assessed for [place] Management for community services. Patient was previously active with [place] Management.  Patient  Has history with [facility] Care Management. Patient admitted with Left nephrectomy 05/30/18 from a renal mass. Currently receiving HD on Tuesday, Thursday, Saturday schedule.   Will follow for progress and needs.   Chart reviewed for progress. Of note, [facility] Care Management services does not replace or interfere with any services that are arranged by inpatient case management or social work. For additional questions or referrals please contact:  Natividad Brood, RN BSN [facility] Liaison  [phone] business mobile phone Toll free office [phone]

## 2018-06-01 NOTE — Progress Notes (Addendum)
  [facility] Progress Note   Subjective:  Seen in room. S/p HD late last night, no UF. Tolerated without issues. Eating breakfast this morning. No CP/dyspnea.   Objective Vitals:   06/01/18 0430 06/01/18 0455 06/01/18 0526 06/01/18 0726  BP: (!) 151/86 (!) 169/103 129/74 119/61  Pulse: 84 97 83 88  Resp: (!) 31 (!) 25 (!) 26 18  Temp:   99.3 F (37.4 C) 99.2 F (37.3 C)  TempSrc:   Oral Oral  SpO2: 100% 100% 100% 100%  Weight:   93.3 kg   Height:       Physical Exam General: Well appearing man, NAD Heart: RRR; no murmur Lungs: CTA anteriorly Abdomen: soft, non-tender. Closed incisions mid-line and several small incisions L abdomen Extremities: No LE edema Dialysis Access: AVG + bruit  Additional Objective Labs: Basic Metabolic Panel: Recent Labs  Lab 05/30/18 1559 05/31/18 0547 06/01/18 0123  NA 142 141 134*  K 3.3* 3.9 3.7  CL 99 97* 91*  CO2 31 27 27   GLUCOSE 137* 158* 126*  BUN 35* 49* 58*  CREATININE 6.05* 7.66* 9.88*  CALCIUM 8.5* 8.5* 8.1*  PHOS  --   --  8.7*   Liver Function Tests: Recent Labs  Lab 06/01/18 0123  ALBUMIN 3.9   CBC: Recent Labs  Lab 05/30/18 0950 05/30/18 1559 05/31/18 0547 06/01/18 0123  WBC 5.4  --   --  11.8*  HGB 13.0 12.1* 10.6* 8.8*  HCT 39.7 38.4* 34.5* 28.8*  MCV 88.2  --   --  92.0  PLT 254  --   --  185   CBG: Recent Labs  Lab 05/31/18 0806 05/31/18 1208 05/31/18 1834 06/01/18 0725 06/01/18 1114  GLUCAP 122* 124* 104* 104* 134*   Medications:  . calcium acetate  1,334 mg Oral TID WC  . carvedilol  25 mg Oral BID WC  . Chlorhexidine Gluconate Cloth  6 each Topical Q0600  . gabapentin  300 mg Oral QHS  . insulin aspart  0-15 Units Subcutaneous TID WC  . pravastatin  40 mg Oral Daily    Dialysis Orders: Dialysis:TTS [place]  4h 92.5kg 2/2.5 bath LFA AVG Hep 3000 Home meds: - diltiazem xt 180 qd/ carvedilol 25 bid/ furosemide 80 bid/ hydralazine 100 tid - liraglutide  0.6 mg sq qd - clopidogrel 75 qd/ pravastatin 40 qd - gabapentin 300 hs/ prn tramadol / prn norco - calc acetate tid ac/ prn's / vitamins  Assessment/Plan: 1. L renal mass (s/p L nephrectomy 10/16): Suspected RCC , pathology pending. 2. ESRD: Continue HD per TTS schedule, next 10/19 if still inpatient. No heparin x 1 week with HD. 3. HTN/volume: BP ok, no fluid removed with HD last night -- will keep EDW same for now. 4. Anemia: Hgb 8.8, will start ESA with next HD. 5. Secondary hyperparathyroidism: Ca ok, Phos high -- resume home dose binders.  6. Type 2 DM 7. Hx CVA 8. Ok for discharge from renal standpoint once cleared by surgery.  Veneta Penton, PA-C 06/01/2018, 11:50 AM  [facility] Pager: [phone]  Pt seen, examined and agree w A/P as above.  Kelly Splinter MD [employer] pager [phone]   06/01/2018, 12:20 PM

## 2018-06-01 NOTE — Progress Notes (Addendum)
HD tx initiated via 15Gx3 w/ increased difficulty cannulating arterial side of AVG, once accessed established -->pull/push/flush well w/o problem,still having increased AP, after needle repositioned AP came down and BFR increased slowly, VSS, will cont to monitor while on HD tx

## 2018-06-02 LAB — GLUCOSE, CAPILLARY
Glucose-Capillary: 80 mg/dL (ref 70–99)
Glucose-Capillary: 95 mg/dL (ref 70–99)

## 2018-06-02 MED ORDER — ONDANSETRON HCL 4 MG PO TABS: 4.0000 mg | ORAL_TABLET | Freq: Three times a day (TID) | ORAL | Status: DC | PRN

## 2018-06-02 MED ORDER — CHLORHEXIDINE GLUCONATE CLOTH 2 % EX PADS
6.0000 | MEDICATED_PAD | Freq: Every day | CUTANEOUS | Status: DC
  Administered 2018-06-02: 6 via TOPICAL

## 2018-06-02 MED ORDER — OXYCODONE HCL 5 MG PO TABS: 5.0000 mg | ORAL_TABLET | ORAL | 0 refills | Status: DC | PRN

## 2018-06-02 NOTE — Progress Notes (Signed)
Patient discharged per MD order. Discharge instructions reviewed with patient. Patient stated he cannot take hydrocodone d/t causing peeling of his hands and feet. Notified MD for new prescription.  Daughter provided transportation to outpatient hemodialysis. Patient escorted via wheelchair. Patient discharged 1226. Bartholomew Crews, RN

## 2018-06-02 NOTE — Care Management Note (Signed)
Case Management Note  Patient Details  Name: Charles Hart MRN: [id number] Date of Birth: 17-Feb-1956  Subjective/Objective:                    Action/Plan:  RW to be delivered to patient room prior to DC.   Expected Discharge Date:  06/01/18               Expected Discharge Plan:  Home/Self Care  In-House Referral:     Discharge planning Services  CM Consult  Post Acute Care Choice:  Durable Medical Equipment Choice offered to:     DME Arranged:  Walker rolling DME Agency:  [name]:    [facility] Agency:     Status of Service:  Completed, signed off  If discussed at [place] of Stay Meetings, dates discussed:    Additional Comments:  Carles Collet, RN 06/02/2018, 9:31 AM

## 2018-06-02 NOTE — Evaluation (Signed)
Physical Therapy Evaluation & Discharge Patient Details Name: Charles Hart MRN: [id number] DOB: 1955-10-08 Today's Date: 06/02/2018   History of Present Illness  Pt is a 62 y.o. male admitted for L radical nephrectomy on 05/30/18. PMH includes ESRD, DM, CVA, CHF, peripheral neuropathy, HTN.    Clinical Impression  Patient evaluated by Physical Therapy with no further acute PT needs identified. PTA, pt indep and lives alone; will hve PRN assist from family. Today, pt mod indep with RW. All education has been completed and the patient has no further questions. Acute PT is signing off. Thank you for this referral.    Follow Up Recommendations No PT follow up;Supervision - Intermittent    Equipment Recommendations  Rolling walker with 5" wheels    Recommendations for Other Services       Precautions / Restrictions Precautions Precautions: None Restrictions Weight Bearing Restrictions: No      Mobility  Bed Mobility Overal bed mobility: Modified Independent             General bed mobility comments: Increased time and effort; HOB elevated  Transfers Overall transfer level: Modified independent Equipment used: Rolling walker (2 wheeled)                Ambulation/Gait Ambulation/Gait assistance: Modified independent (Device/Increase time) Gait Distance (Feet): 300 Feet Assistive device: Rolling walker (2 wheeled) Gait Pattern/deviations: Step-through pattern;Decreased stride length;Trunk flexed;Antalgic Gait velocity: Decreased Gait velocity interpretation: 1.31 - 2.62 ft/sec, indicative of limited community ambulator General Gait Details: Intermittent cues for upright posture  Stairs            Wheelchair Mobility    Modified Rankin (Stroke Patients Only)       Balance Overall balance assessment: No apparent balance deficits (not formally assessed)                                           Pertinent Vitals/Pain Pain  Assessment: Faces Faces Pain Scale: Hurts little more Pain Location: Abdominal incision Pain Descriptors / Indicators: Sore;Guarding Pain Intervention(s): Monitored during session    Home Living Family/patient expects to be discharged to:: Private residence Living Arrangements: Alone Available Help at Discharge: Family;Available PRN/intermittently Type of Home: Apartment Home Access: Stairs to enter;Elevator   Entrance Stairs-Number of Steps: 5th story apt with elevator Home Layout: One level Home Equipment: None      Prior Function Level of Independence: Independent               Hand Dominance        Extremity/Trunk Assessment   Upper Extremity Assessment Upper Extremity Assessment: Overall WFL for tasks assessed    Lower Extremity Assessment Lower Extremity Assessment: Overall WFL for tasks assessed       Communication   Communication: No difficulties  Cognition Arousal/Alertness: Awake/alert Behavior During Therapy: Flat affect Overall Cognitive Status: Within Functional Limits for tasks assessed                                        General Comments      Exercises     Assessment/Plan    PT Assessment Patent does not need any further PT services  PT Problem List         PT Treatment Interventions      PT  Goals (Current goals can be found in the Care Plan section)  Acute Rehab PT Goals PT Goal Formulation: All assessment and education complete, DC therapy    Frequency     Barriers to discharge        Co-evaluation               AM-PAC PT "6 Clicks" Daily Activity  Outcome Measure Difficulty turning over in bed (including adjusting bedclothes, sheets and blankets)?: A Little Difficulty moving from lying on back to sitting on the side of the bed? : A Little Difficulty sitting down on and standing up from a chair with arms (e.g., wheelchair, bedside commode, etc,.)?: None Help needed moving to and from a bed to  chair (including a wheelchair)?: None Help needed walking in hospital room?: None Help needed climbing 3-5 steps with a railing? : A Little 6 Click Score: 21    End of Session Equipment Utilized During Treatment: Gait belt Activity Tolerance: Patient tolerated treatment well Patient left: with call bell/phone within reach(seated EOB) Nurse Communication: Mobility status PT Visit Diagnosis: Other abnormalities of gait and mobility (R26.89)    Time: 7915-0413 PT Time Calculation (min) (ACUTE ONLY): 18 min   Charges:   PT Evaluation $PT Eval Low Complexity: [place], PT, DPT Acute Rehabilitation Services  Pager [phone] Office [place] 06/02/2018, 9:11 AM

## 2018-06-02 NOTE — Progress Notes (Signed)
Pt refused CPAP for tonight.  

## 2018-06-02 NOTE — Progress Notes (Addendum)
  [facility] Progress Note   Subjective:  Seen in room. No CP/dyspnea. Abdomen sore, btu manageable. Plan is for discharge this morning prior to his outpatient HD.  Objective Vitals:   06/01/18 1632 06/01/18 2122 06/02/18 0641 06/02/18 0923  BP: (!) 155/71 (!) 167/84 128/64 (!) 141/70  Pulse: 98 93 86 80  Resp: 18 18 18 20   Temp: 98 F (36.7 C) 99.6 F (37.6 C) 98.8 F (37.1 C) 98.7 F (37.1 C)  TempSrc: Oral Oral Oral Oral  SpO2: 90% 96% 97% 96%  Weight:      Height:       Physical Exam General: Well appearing man, NAD Heart: RRR; no murmur Lungs: CTA anteriorly Abdomen: soft, non-tender. Closed incisions mid-line and several small incisions L abdomen Extremities: No LE edema Dialysis Access: AVG + bruit  Additional Objective Labs: Basic Metabolic Panel: Recent Labs  Lab 05/30/18 1559 05/31/18 0547 06/01/18 0123  NA 142 141 134*  K 3.3* 3.9 3.7  CL 99 97* 91*  CO2 31 27 27   GLUCOSE 137* 158* 126*  BUN 35* 49* 58*  CREATININE 6.05* 7.66* 9.88*  CALCIUM 8.5* 8.5* 8.1*  PHOS  --   --  8.7*   Liver Function Tests: Recent Labs  Lab 06/01/18 0123  ALBUMIN 3.9   CBC: Recent Labs  Lab 05/30/18 0950 05/30/18 1559 05/31/18 0547 06/01/18 0123  WBC 5.4  --   --  11.8*  HGB 13.0 12.1* 10.6* 8.8*  HCT 39.7 38.4* 34.5* 28.8*  MCV 88.2  --   --  92.0  PLT 254  --   --  185   Medications:  . calcium acetate  1,334 mg Oral TID WC  . carvedilol  25 mg Oral BID WC  . gabapentin  300 mg Oral QHS  . insulin aspart  0-15 Units Subcutaneous TID WC  . pravastatin  40 mg Oral Daily    Dialysis Orders: Dialysis:TTS [facility]  4h 92.5kg 2/2.5 bath LFA AVG Hep 3000 Home meds: - diltiazem xt 180 qd/ carvedilol 25 bid/ furosemide 80 bid/ hydralazine 100 tid - liraglutide 0.6 mg sq qd - clopidogrel 75 qd/ pravastatin 40 qd - gabapentin 300 hs/ prn tramadol / prn norco - calc acetate tid ac/ prn's / vitamins  Assessment/Plan: 1. 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 2. ESRD: Continue HD per TTS schedule, HD today after d/c at his usual HD unit. No heparin. 3. HTN/volume: BP ok, UF as tolerated. 4. Anemia: Hgb 8.8, will start ESA with next HD. 5. Secondary hyperparathyroidism: Ca ok, Phos high -- resume home dose binders.  6. Type 2 DM 7. Hx CVA 8. Ok for discharge from renal standpoint once cleared by surgery.  Veneta Penton, PA-C 06/02/2018, 11:20 AM  [facility] Pager: [phone]  Pt seen, examined and agree w A/P as above.  Kelly Splinter MD [employer] pager [phone]   06/02/2018, 1:40 PM

## 2018-06-03 LAB — RPR: RPR: NONREACTIVE

## 2018-06-05 ENCOUNTER — Encounter: Payer: Self-pay | Admitting: Emergency Medicine

## 2018-06-05 ENCOUNTER — Emergency Department: Payer: 59

## 2018-06-05 ENCOUNTER — Inpatient Hospital Stay
Admission: EM | Admit: 2018-06-05 | Discharge: 2018-06-07 | DRG: 393 | Disposition: A | Discharge status: Home Health | Payer: 59 | Attending: Internal Medicine | Admitting: Internal Medicine

## 2018-06-05 ENCOUNTER — Other Ambulatory Visit: Payer: Self-pay

## 2018-06-05 DIAGNOSIS — Z9119 Patient's noncompliance with other medical treatment and regimen: Secondary | ICD-10-CM | POA: Diagnosis not present

## 2018-06-05 DIAGNOSIS — I132 Hypertensive heart and chronic kidney disease with heart failure and with stage 5 chronic kidney disease, or end stage renal disease: Secondary | ICD-10-CM | POA: Diagnosis present

## 2018-06-05 DIAGNOSIS — E1142 Type 2 diabetes mellitus with diabetic polyneuropathy: Secondary | ICD-10-CM | POA: Diagnosis present

## 2018-06-05 DIAGNOSIS — Z91041 Radiographic dye allergy status: Secondary | ICD-10-CM

## 2018-06-05 DIAGNOSIS — K567 Ileus, unspecified: Secondary | ICD-10-CM | POA: Diagnosis present

## 2018-06-05 DIAGNOSIS — H9191 Unspecified hearing loss, right ear: Secondary | ICD-10-CM | POA: Diagnosis present

## 2018-06-05 DIAGNOSIS — E1122 Type 2 diabetes mellitus with diabetic chronic kidney disease: Secondary | ICD-10-CM | POA: Diagnosis present

## 2018-06-05 DIAGNOSIS — Z9989 Dependence on other enabling machines and devices: Secondary | ICD-10-CM | POA: Diagnosis not present

## 2018-06-05 DIAGNOSIS — K668 Other specified disorders of peritoneum: Secondary | ICD-10-CM

## 2018-06-05 DIAGNOSIS — E785 Hyperlipidemia, unspecified: Secondary | ICD-10-CM | POA: Diagnosis present

## 2018-06-05 DIAGNOSIS — Z8673 Personal history of transient ischemic attack (TIA), and cerebral infarction without residual deficits: Secondary | ICD-10-CM

## 2018-06-05 DIAGNOSIS — I2510 Atherosclerotic heart disease of native coronary artery without angina pectoris: Secondary | ICD-10-CM | POA: Diagnosis present

## 2018-06-05 DIAGNOSIS — Z888 Allergy status to other drugs, medicaments and biological substances status: Secondary | ICD-10-CM

## 2018-06-05 DIAGNOSIS — I10 Essential (primary) hypertension: Secondary | ICD-10-CM

## 2018-06-05 DIAGNOSIS — Z85528 Personal history of other malignant neoplasm of kidney: Secondary | ICD-10-CM | POA: Diagnosis not present

## 2018-06-05 DIAGNOSIS — I252 Old myocardial infarction: Secondary | ICD-10-CM

## 2018-06-05 DIAGNOSIS — D631 Anemia in chronic kidney disease: Secondary | ICD-10-CM | POA: Diagnosis present

## 2018-06-05 DIAGNOSIS — C642 Malignant neoplasm of left kidney, except renal pelvis: Secondary | ICD-10-CM | POA: Diagnosis present

## 2018-06-05 DIAGNOSIS — E11319 Type 2 diabetes mellitus with unspecified diabetic retinopathy without macular edema: Secondary | ICD-10-CM | POA: Diagnosis present

## 2018-06-05 DIAGNOSIS — R112 Nausea with vomiting, unspecified: Secondary | ICD-10-CM | POA: Diagnosis present

## 2018-06-05 DIAGNOSIS — Z992 Dependence on renal dialysis: Secondary | ICD-10-CM

## 2018-06-05 DIAGNOSIS — N186 End stage renal disease: Secondary | ICD-10-CM | POA: Diagnosis present

## 2018-06-05 DIAGNOSIS — G4733 Obstructive sleep apnea (adult) (pediatric): Secondary | ICD-10-CM | POA: Diagnosis present

## 2018-06-05 DIAGNOSIS — N2581 Secondary hyperparathyroidism of renal origin: Secondary | ICD-10-CM | POA: Diagnosis present

## 2018-06-05 DIAGNOSIS — E1121 Type 2 diabetes mellitus with diabetic nephropathy: Secondary | ICD-10-CM | POA: Diagnosis present

## 2018-06-05 DIAGNOSIS — I429 Cardiomyopathy, unspecified: Secondary | ICD-10-CM | POA: Diagnosis present

## 2018-06-05 DIAGNOSIS — Z87441 Personal history of nephrotic syndrome: Secondary | ICD-10-CM | POA: Diagnosis not present

## 2018-06-05 DIAGNOSIS — K9189 Other postprocedural complications and disorders of digestive system: Secondary | ICD-10-CM | POA: Diagnosis present

## 2018-06-05 DIAGNOSIS — Z905 Acquired absence of kidney: Secondary | ICD-10-CM

## 2018-06-05 DIAGNOSIS — Z9102 Food additives allergy status: Secondary | ICD-10-CM

## 2018-06-05 DIAGNOSIS — R948 Abnormal results of function studies of other organs and systems: Secondary | ICD-10-CM | POA: Diagnosis present

## 2018-06-05 DIAGNOSIS — I5042 Chronic combined systolic (congestive) and diastolic (congestive) heart failure: Secondary | ICD-10-CM | POA: Diagnosis present

## 2018-06-05 DIAGNOSIS — Z91048 Other nonmedicinal substance allergy status: Secondary | ICD-10-CM

## 2018-06-05 DIAGNOSIS — Z79899 Other long term (current) drug therapy: Secondary | ICD-10-CM

## 2018-06-05 DIAGNOSIS — Z86010 Personal history of colonic polyps: Secondary | ICD-10-CM

## 2018-06-05 DIAGNOSIS — Z7902 Long term (current) use of antithrombotics/antiplatelets: Secondary | ICD-10-CM

## 2018-06-05 DIAGNOSIS — Z885 Allergy status to narcotic agent status: Secondary | ICD-10-CM

## 2018-06-05 LAB — COMPREHENSIVE METABOLIC PANEL
ALT: 9 U/L (ref 0–44)
AST: 24 U/L (ref 15–41)
Albumin: 4.3 g/dL (ref 3.5–5.0)
Alkaline Phosphatase: 55 U/L (ref 38–126)
Anion gap: 15 (ref 5–15)
BUN: 62 mg/dL — AB (ref 8–23)
CHLORIDE: 97 mmol/L — AB (ref 98–111)
CO2: 25 mmol/L (ref 22–32)
Calcium: 8.7 mg/dL — ABNORMAL LOW (ref 8.9–10.3)
Creatinine, Ser: 11.42 mg/dL — ABNORMAL HIGH (ref 0.61–1.24)
GFR, EST AFRICAN AMERICAN: 5 mL/min — AB (ref >60)
GFR, EST NON AFRICAN AMERICAN: 4 mL/min — AB (ref >60)
Glucose, Bld: 120 mg/dL — ABNORMAL HIGH (ref 70–99)
Potassium: 4 mmol/L (ref 3.5–5.1)
SODIUM: 137 mmol/L (ref 135–145)
Total Bilirubin: 1.4 mg/dL — ABNORMAL HIGH (ref 0.3–1.2)
Total Protein: 7.9 g/dL (ref 6.5–8.1)

## 2018-06-05 LAB — URINALYSIS, COMPLETE (UACMP) WITH MICROSCOPIC
Bilirubin Urine: NEGATIVE
GLUCOSE, UA: NEGATIVE mg/dL
Ketones, ur: NEGATIVE mg/dL
NITRITE: NEGATIVE
PH: 5 (ref 5.0–8.0)
Protein, ur: 100 mg/dL — AB
Specific Gravity, Urine: 1.013 (ref 1.005–1.030)

## 2018-06-05 LAB — CBC
HEMATOCRIT: 31.1 % — AB (ref 39.0–52.0)
HEMOGLOBIN: 10.3 g/dL — AB (ref 13.0–17.0)
MCH: 29.3 pg (ref 26.0–34.0)
MCHC: 33.1 g/dL (ref 30.0–36.0)
MCV: 88.6 fL (ref 80.0–100.0)
NRBC: 0 % (ref 0.0–0.2)
Platelets: 316 10*3/uL (ref 150–400)
RBC: 3.51 MIL/uL — ABNORMAL LOW (ref 4.22–5.81)
RDW: 12.7 % (ref 11.5–15.5)
WBC: 9.2 10*3/uL (ref 4.0–10.5)

## 2018-06-05 LAB — PHOSPHORUS: Phosphorus: 2.9 mg/dL (ref 2.5–4.6)

## 2018-06-05 LAB — LIPASE, BLOOD: LIPASE: 41 U/L (ref 11–51)

## 2018-06-05 LAB — TROPONIN I: Troponin I: 0.06 ng/mL (ref <0.03)

## 2018-06-05 MED ORDER — DILTIAZEM HCL ER COATED BEADS 180 MG PO CP24
180.0000 mg | ORAL_CAPSULE | Freq: Every day | ORAL | Status: DC
  Administered 2018-06-06 – 2018-06-07 (×2): 180 mg via ORAL
  Filled 2018-06-05 (×3): qty 1

## 2018-06-05 MED ORDER — SODIUM CHLORIDE 0.9 % IV SOLN: 250.0000 mL | INTRAVENOUS | Status: DC | PRN

## 2018-06-05 MED ORDER — LIDOCAINE HCL (PF) 1 % IJ SOLN
5.0000 mL | INTRAMUSCULAR | Status: DC | PRN
  Filled 2018-06-05: qty 5

## 2018-06-05 MED ORDER — ONDANSETRON HCL 4 MG PO TABS: 4.0000 mg | ORAL_TABLET | Freq: Four times a day (QID) | ORAL | Status: DC | PRN

## 2018-06-05 MED ORDER — ALTEPLASE 2 MG IJ SOLR: 2.0000 mg | Freq: Once | INTRAMUSCULAR | Status: DC | PRN

## 2018-06-05 MED ORDER — SODIUM CHLORIDE 0.9% FLUSH: 3.0000 mL | INTRAVENOUS | Status: DC | PRN

## 2018-06-05 MED ORDER — CLOPIDOGREL BISULFATE 75 MG PO TABS
75.0000 mg | ORAL_TABLET | Freq: Every day | ORAL | Status: DC
  Administered 2018-06-06: 75 mg via ORAL
  Filled 2018-06-05 (×2): qty 1

## 2018-06-05 MED ORDER — METOPROLOL TARTRATE 5 MG/5ML IV SOLN
5.0000 mg | Freq: Four times a day (QID) | INTRAVENOUS | Status: DC | PRN
  Administered 2018-06-05: 5 mg via INTRAVENOUS
  Filled 2018-06-05 (×2): qty 5

## 2018-06-05 MED ORDER — NEPHRO-VITE 0.8 MG PO TABS
1.0000 | ORAL_TABLET | Freq: Every day | ORAL | Status: DC
  Administered 2018-06-06 – 2018-06-07 (×2): 1 via ORAL
  Filled 2018-06-05 (×5): qty 1

## 2018-06-05 MED ORDER — HYDRALAZINE HCL 50 MG PO TABS
100.0000 mg | ORAL_TABLET | Freq: Three times a day (TID) | ORAL | Status: DC
  Administered 2018-06-05 – 2018-06-06 (×3): 100 mg via ORAL
  Filled 2018-06-05 (×3): qty 2

## 2018-06-05 MED ORDER — ONDANSETRON HCL 4 MG/2ML IJ SOLN
4.0000 mg | Freq: Four times a day (QID) | INTRAMUSCULAR | Status: DC | PRN
  Administered 2018-06-05 – 2018-06-06 (×2): 4 mg via INTRAVENOUS
  Filled 2018-06-05 (×2): qty 2

## 2018-06-05 MED ORDER — SODIUM CHLORIDE 0.9 % IV BOLUS
250.0000 mL | Freq: Once | INTRAVENOUS | Status: AC
  Administered 2018-06-05: 250 mL via INTRAVENOUS

## 2018-06-05 MED ORDER — SODIUM CHLORIDE 0.9 % IV SOLN: 100.0000 mL | INTRAVENOUS | Status: DC | PRN

## 2018-06-05 MED ORDER — LIDOCAINE-PRILOCAINE 2.5-2.5 % EX CREA
1.0000 "application " | TOPICAL_CREAM | CUTANEOUS | Status: DC | PRN
  Filled 2018-06-05: qty 5

## 2018-06-05 MED ORDER — PENTAFLUOROPROP-TETRAFLUOROETH EX AERO
1.0000 "application " | INHALATION_SPRAY | CUTANEOUS | Status: DC | PRN
  Filled 2018-06-05: qty 30

## 2018-06-05 MED ORDER — HEPARIN SODIUM (PORCINE) 5000 UNIT/ML IJ SOLN
5000.0000 [IU] | Freq: Three times a day (TID) | INTRAMUSCULAR | Status: DC
  Filled 2018-06-05 (×2): qty 1

## 2018-06-05 MED ORDER — CALCIUM ACETATE (PHOS BINDER) 667 MG PO CAPS: 1334.0000 mg | ORAL_CAPSULE | Freq: Three times a day (TID) | ORAL | Status: DC

## 2018-06-05 MED ORDER — ONDANSETRON HCL 4 MG/2ML IJ SOLN
4.0000 mg | Freq: Once | INTRAMUSCULAR | Status: AC
  Administered 2018-06-05: 4 mg via INTRAVENOUS
  Filled 2018-06-05: qty 2

## 2018-06-05 MED ORDER — FUROSEMIDE 40 MG PO TABS
80.0000 mg | ORAL_TABLET | Freq: Two times a day (BID) | ORAL | Status: DC
  Administered 2018-06-05 – 2018-06-06 (×3): 80 mg via ORAL
  Filled 2018-06-05 (×3): qty 2

## 2018-06-05 MED ORDER — OXYCODONE HCL 5 MG PO TABS
5.0000 mg | ORAL_TABLET | Freq: Three times a day (TID) | ORAL | Status: DC | PRN
  Administered 2018-06-05: 5 mg via ORAL
  Filled 2018-06-05: qty 1

## 2018-06-05 MED ORDER — PROMETHAZINE HCL 25 MG PO TABS
25.0000 mg | ORAL_TABLET | Freq: Four times a day (QID) | ORAL | Status: DC | PRN
  Administered 2018-06-06 (×2): 25 mg via ORAL
  Filled 2018-06-05 (×2): qty 1

## 2018-06-05 MED ORDER — GABAPENTIN 300 MG PO CAPS
300.0000 mg | ORAL_CAPSULE | Freq: Every day | ORAL | Status: DC
  Administered 2018-06-05 – 2018-06-06 (×2): 300 mg via ORAL
  Filled 2018-06-05 (×2): qty 1

## 2018-06-05 MED ORDER — CHLORHEXIDINE GLUCONATE CLOTH 2 % EX PADS: 6.0000 | MEDICATED_PAD | Freq: Every day | CUTANEOUS | Status: DC

## 2018-06-05 MED ORDER — CARVEDILOL 25 MG PO TABS
25.0000 mg | ORAL_TABLET | Freq: Two times a day (BID) | ORAL | Status: DC
  Administered 2018-06-05 – 2018-06-06 (×3): 25 mg via ORAL
  Filled 2018-06-05 (×3): qty 1

## 2018-06-05 MED ORDER — ACETAMINOPHEN 650 MG RE SUPP: 650.0000 mg | Freq: Four times a day (QID) | RECTAL | Status: DC | PRN

## 2018-06-05 MED ORDER — PRAVASTATIN SODIUM 20 MG PO TABS
40.0000 mg | ORAL_TABLET | Freq: Every day | ORAL | Status: DC
  Administered 2018-06-06 – 2018-06-07 (×2): 40 mg via ORAL
  Filled 2018-06-05 (×3): qty 2

## 2018-06-05 MED ORDER — SODIUM CHLORIDE 0.9% FLUSH: 3.0000 mL | Freq: Two times a day (BID) | INTRAVENOUS | Status: DC

## 2018-06-05 MED ORDER — FLUTICASONE PROPIONATE 50 MCG/ACT NA SUSP
2.0000 | NASAL | Status: DC | PRN
  Filled 2018-06-05: qty 16

## 2018-06-05 MED ORDER — ONDANSETRON HCL 4 MG/2ML IJ SOLN
4.0000 mg | Freq: Once | INTRAMUSCULAR | Status: AC
  Administered 2018-06-05: 4 mg via INTRAVENOUS

## 2018-06-05 MED ORDER — ACETAMINOPHEN 325 MG PO TABS: 650.0000 mg | ORAL_TABLET | Freq: Four times a day (QID) | ORAL | Status: DC | PRN

## 2018-06-05 MED ORDER — METOCLOPRAMIDE HCL 5 MG/ML IJ SOLN
10.0000 mg | Freq: Once | INTRAMUSCULAR | Status: AC
  Administered 2018-06-05: 10 mg via INTRAVENOUS
  Filled 2018-06-05: qty 2

## 2018-06-05 MED ORDER — ONDANSETRON HCL 4 MG/2ML IJ SOLN
INTRAMUSCULAR | Status: AC
  Filled 2018-06-05: qty 2

## 2018-06-05 MED ORDER — HEPARIN SODIUM (PORCINE) 1000 UNIT/ML DIALYSIS
1000.0000 [IU] | INTRAMUSCULAR | Status: DC | PRN
  Filled 2018-06-05: qty 1

## 2018-06-05 NOTE — H&P (Signed)
[facility] NAME: Charles Hart    MR#:  [id number]  DATE OF BIRTH:  [birth year]  DATE OF ADMISSION:  06/05/2018  PRIMARY CARE PHYSICIAN: Lucious Groves, DO   REQUESTING/REFERRING PHYSICIAN:   CHIEF COMPLAINT:   Chief Complaint  Patient presents with  . Nausea    HISTORY OF PRESENT ILLNESS: Charles Hart  is a 61 y.o. male with a known history per below which also includes recent nephrectomy for kidney cancer 1 week ago, known history of end-stage renal disease on hemodialysis Tuesdays/Thursdays/Saturdays-missed treatment on today, presents with acute nausea/emesis, no bowel movement since Sunday, in the emergency room patient was found to be tachypneic, tachycardic, blood pressure greater than 678 systolically, creatinine 11, troponin 0.06, CT abdomen noted for left nephrectomy/free air, KUB noted for free air/possible ileus, patient evaluated in the emergency room, no apparent distress, resting comfortably in bed, denies pain, ED did discuss case with surgery attending Dr. Chauncy Passy surgical intervention recommended, patient is now be admitted for acute probable postoperative ileus, acute accelerated hypertension, chronic end-stage renal disease-missed hemodialysis today, and kidney cancer status post resection 1 week ago.  PAST MEDICAL HISTORY:   Past Medical History:  Diagnosis Date  . Adenomatous colon polyp 07/02/2011   Last colonoscopy May 06, 2011 by Dr. Owens Loffler, who recommended repeat colonoscopy in 5 years.   . Anemia   . Background diabetic retinopathy 04/20/2012   Patient is followed by Dr. Katy Fitch   . Cardiomyopathy    LV function improved from 2004 to 2008.  Historically, moderately dilated LV with EF 30-40% by 2D echo 08/14/2002.  Mild CAD with severe LV dysfunction by cardiac cath 09/2002.  Normal coronary arteries and normal LV function by cardiac cath 09/19/2006.  A 2-D echo on 04/01/2009 showed mild concentric  hypertrophy and normal systolic (LVEF  93-81%) and doppler C/W with grade 1 diastolic dysfunction.  . CHF (congestive heart failure) ([place])    LV function improved from 2004 to 2008.  Historically, moderately dilated LV with EF 30-40% by 2D echo 08/14/2002.  Mild CAD with severe LV dysfunction by cardiac cath 09/2002.  Normal coronary arteries and normal LV function by cardiac cath 09/19/2006.  A 2-D echo on 04/01/2009 showed mild concentric hypertrophy and normal systolic (LVEF  01-75%) and doppler C/W with grade 1 diastolic dysfunction..   . Chronic combined systolic and diastolic congestive heart failure ([place]) 05/21/2010   LV function improved from 2004 to 2008.  Historically, moderately dilated LV with EF 30-40% by 2D echo 08/14/2002.  Mild CAD with severe LV dysfunction by cardiac cath 09/2002.  Normal coronary arteries and normal LV function by cardiac cath 09/19/2006.  A 2-D echo on 04/01/2009 showed mild concentric hypertrophy and normal systolic (LVEF  10-25%) and doppler parameters consistent with abnormal left   . CVA (cerebral vascular accident) ([place]) 07/04/2012   MRI of the brain 07/04/2012 showed an acute infarct in the right basal ganglia involving the anterior putamen, anterior limb internal capsule, and head of the caudate; this measured approximately 2.5 cm in diameter.     . Dermatitis   . Diabetes mellitus    type 2  . DIABETIC PERIPHERAL NEUROPATHY 08/03/2007   Qualifier: Diagnosis of  By: Marinda Elk MD, Sonia Side    . DM neuropathy, painful (HCC)    fingers and right knee  . ESRD (end stage renal disease) ([place])    Stage 4, on hemodailysis x 4 months as of 05-18-18 [facility], tues thurs sat  . ESRD (end stage renal disease) on dialysis ([place])    "TTS; [facility]" (05/31/2018)  . Hearing loss in right ear   . Hyperlipidemia   . Hypertension   . Hypertensive crisis 07/28/2012  . Hypertensive urgency 08/20/2014  . Myocardial infarction [facility])  many yrs ago  . Nephrotic syndrome  02/18/2013   A 24-hour urine collection 03/04/2013 showed total protein of 5,460 g and creatinine clearance of 80 mL/minute.  Patient was seen by Seward Meth at [facility] and a repeat 24-hour urine showed 10,407 mg protein.  Patient underwent kidney biopsy on 05/30/2013; pathology showed advanced diffuse and nodular diabetic nephropathy with vascular changes consistent with long-standing difficult to control hypertension.     . Pneumonia 2014 and 2015  . Renal insufficiency   . Sleep apnea    uses cpap "sometimes per pt"    PAST SURGICAL HISTORY:  Past Surgical History:  Procedure Laterality Date  . AV FISTULA PLACEMENT Left 11/24/2017   Procedure: INSERTION OF ARTERIOVENOUS (AV) GORE-TEX GRAFT LEFT LOWER ARM;  Surgeon: Rosetta Posner, MD;  Location: [facility];  Service: Vascular;  Laterality: Left;  . CARDIAC CATHETERIZATION      4 times  . COLONOSCOPY    . FOOT SURGERY    . POLYPECTOMY    . ROBOT ASSISTED LAPAROSCOPIC NEPHRECTOMY Left 05/30/2018   Procedure: XI ROBOTIC ASSISTED LAPAROSCOPIC NEPHRECTOMY;  Surgeon: Alexis Frock, MD;  Location: [facility];  Service: Urology;  Laterality: Left;    SOCIAL HISTORY:  Social History   Tobacco Use  . Smoking status: Never Smoker  . Smokeless tobacco: Never Used  Substance Use Topics  . Alcohol use: No    Alcohol/week: 0.0 standard drinks    FAMILY HISTORY:  Family History  Problem Relation Age of Onset  . Aneurysm Father 57       died of rupture  . Colon cancer Sister     DRUG ALLERGIES:  Allergies  Allergen Reactions  . Ivp Dye [Iodinated Diagnostic Agents] Other (See Comments)    Per patient's Nephrologist, he doesn't want the patient exposed to ANY dye because of issues with his kidneys  . Hydrocodone     Caused hands and feet to peel   . Amlodipine Swelling  . Lisinopril Cough  . Red Dye Other (See Comments)    NO dye of any kind (issues with his kidneys)    REVIEW OF SYSTEMS:    CONSTITUTIONAL: No fever, +fatigue, weakness.  EYES: No blurred or double vision.  EARS, NOSE, AND THROAT: No tinnitus or ear pain.  RESPIRATORY: No cough, shortness of breath, wheezing or hemoptysis.  CARDIOVASCULAR: No chest pain, orthopnea, edema.  GASTROINTESTINAL: + nausea, vomiting GENITOURINARY: No dysuria, hematuria.  ENDOCRINE: No polyuria, nocturia,  HEMATOLOGY: No anemia, easy bruising or bleeding SKIN: No rash or lesion. MUSCULOSKELETAL: No joint pain or arthritis.   NEUROLOGIC: No tingling, numbness, weakness.  PSYCHIATRY: No anxiety or depression.   MEDICATIONS AT HOME:  Prior to Admission medications   Medication Sig Start Date End Date Taking? Authorizing Provider  calcium acetate (PHOSLO) 667 MG capsule Take 1,334 mg by mouth 3 (three) times daily with meals.  12/15/17  Yes [place], Caren Griffins, MD  CARTIA XT 180 MG 24 hr capsule TAKE 1 CAPSULE(180 MG) BY MOUTH DAILY Patient taking differently: Take 180 mg by mouth daily. TAKE 1 CAPSULE(180 MG) BY MOUTH DAILY 09/07/17  Yes Lucious Groves, DO  carvedilol (COREG) 25 MG tablet  Take 25 mg by mouth 2 (two) times daily with a meal.  09/06/17  Yes [provider]  clopidogrel (PLAVIX) 75 MG tablet Take 75 mg by mouth daily.   Yes [provider]  fluticasone (FLONASE) 50 MCG/ACT nasal spray Place 2 sprays daily into both nostrils. Patient taking differently: Place 2 sprays into both nostrils as needed for allergies.  07/01/17  Yes Noemi Chapel, MD  folic acid-vitamin b complex-vitamin c-selenium-zinc (DIALYVITE) 3 MG TABS tablet Take 1 tablet by mouth daily.   Yes [provider]  furosemide (LASIX) 80 MG tablet Take 80 mg by mouth 2 (two) times daily.   Yes [provider]  gabapentin (NEURONTIN) 300 MG capsule Take 300 mg by mouth at bedtime.   Yes [provider]  hydrALAZINE (APRESOLINE) 100 MG tablet Take 1 tablet (100 mg total) by mouth 3 (three) times daily. 09/07/17 09/16/18 Yes  Lucious Groves, DO  oxyCODONE (OXY IR/ROXICODONE) 5 MG immediate release tablet Take 1 tablet (5 mg total) by mouth every 4 (four) hours as needed for moderate pain. 06/02/18  Yes McKenzie, Candee Furbish, MD  pravastatin (PRAVACHOL) 40 MG tablet Take 1 tablet (40 mg total) by mouth daily. 09/07/17  Yes Lucious Groves, DO  promethazine (PHENERGAN) 25 MG tablet Take 1 tablet (25 mg total) by mouth every 6 (six) hours as needed for nausea or vomiting. 12/07/17  Yes Lucious Groves, DO      PHYSICAL EXAMINATION:   VITAL SIGNS: Blood pressure (!) 144/81, pulse 96, temperature 98.8 F (37.1 C), temperature source Oral, resp. rate (!) 24, height 6' (1.829 m), weight 88.9 kg, SpO2 98 %.  GENERAL:  62 y.o.-year-old patient lying in the bed with no acute distress.  Obese EYES: Pupils equal, round, reactive to light and accommodation. No scleral icterus. Extraocular muscles intact.  HEENT: Head atraumatic, normocephalic. Oropharynx and nasopharynx clear.  NECK:  Supple, no jugular venous distention. No thyroid enlargement, no tenderness.  LUNGS: Normal breath sounds bilaterally, no wheezing, rales,rhonchi or crepitation. No use of accessory muscles of respiration.  CARDIOVASCULAR: S1, S2 normal. No murmurs, rubs, or gallops.  ABDOMEN: Soft, + surgical tenderness, nondistended. Bowel sounds present. No organomegaly or mass.  Healing surgical scar EXTREMITIES: No pedal edema, cyanosis, or clubbing.  NEUROLOGIC: Cranial nerves II through XII are intact. MAES. Gait not checked.  PSYCHIATRIC: The patient is alert and oriented x 3.  SKIN: No obvious rash, lesion, or ulcer.   LABORATORY PANEL:   CBC Recent Labs  Lab 05/30/18 0950 05/30/18 1559 05/31/18 0547 06/01/18 0123 06/05/18 0800  WBC 5.4  --   --  11.8* 9.2  HGB 13.0 12.1* 10.6* 8.8* 10.3*  HCT 39.7 38.4* 34.5* 28.8* 31.1*  PLT 254  --   --  185 316  MCV 88.2  --   --  92.0 88.6  MCH 28.9  --   --  28.1 29.3  MCHC 32.7  --   --  30.6 33.1   RDW 12.9  --   --  13.3 12.7   ------------------------------------------------------------------------------------------------------------------  Chemistries  Recent Labs  Lab 05/30/18 0950 05/30/18 1559 05/31/18 0547 06/01/18 0123 06/05/18 0800  NA 141 142 141 134* 137  K 3.3* 3.3* 3.9 3.7 4.0  CL 98 99 97* 91* 97*  CO2 30 31 27 27 25   GLUCOSE 100* 137* 158* 126* 120*  BUN 31* 35* 49* 58* 62*  CREATININE 5.33* 6.05* 7.66* 9.88* 11.42*  CALCIUM 9.4 8.5* 8.5* 8.1* 8.7*  AST  --   --   --   --  24  ALT  --   --   --   --  9  ALKPHOS  --   --   --   --  55  BILITOT  --   --   --   --  1.4*   ------------------------------------------------------------------------------------------------------------------ estimated creatinine clearance is 7.5 mL/min (A) (by C-G formula based on SCr of 11.42 mg/dL (H)). ------------------------------------------------------------------------------------------------------------------ No results for input(s): TSH, T4TOTAL, T3FREE, THYROIDAB in the last 72 hours.  Invalid input(s): FREET3   Coagulation profile No results for input(s): INR, PROTIME in the last 168 hours. ------------------------------------------------------------------------------------------------------------------- No results for input(s): DDIMER in the last 72 hours. -------------------------------------------------------------------------------------------------------------------  Cardiac Enzymes Recent Labs  Lab 06/05/18 0819  TROPONINI 0.06*   ------------------------------------------------------------------------------------------------------------------ Invalid input(s): POCBNP  ---------------------------------------------------------------------------------------------------------------  Urinalysis    Component Value Date/Time   COLORURINE AMBER (A) 06/05/2018 0807   APPEARANCEUR HAZY (A) 06/05/2018 0807   LABSPEC 1.013 06/05/2018 0807   PHURINE 5.0  06/05/2018 0807   GLUCOSEU NEGATIVE 06/05/2018 0807   HGBUR MODERATE (A) 06/05/2018 0807   BILIRUBINUR NEGATIVE 06/05/2018 0807   KETONESUR NEGATIVE 06/05/2018 0807   PROTEINUR 100 (A) 06/05/2018 0807   UROBILINOGEN 0.2 11/14/2014 1814   NITRITE NEGATIVE 06/05/2018 0807   LEUKOCYTESUR SMALL (A) 06/05/2018 0807     RADIOLOGY: Ct Abdomen Pelvis Wo Contrast  Result Date: 06/05/2018 CLINICAL DATA:  Nausea, vomiting, recent nephrectomy for renal cell carcinoma EXAM: CT ABDOMEN AND PELVIS WITHOUT CONTRAST TECHNIQUE: Multidetector CT imaging of the abdomen and pelvis was performed following the standard protocol without IV contrast. COMPARISON:  CT abdomen and pelvis of 04/04/2018 and abdomen plain films 06/05/2017 FINDINGS: Lower chest: There has been slight increase in bibasilar linear atelectasis. A small nodular opacity posteriorly in the left lower lobe on image 10 series 3 may be due to atelectasis but attention to this area on follow-up chest x-ray is recommended. This nodule was not present on chest x-ray of 04/04/2018 and therefore most likely is inflammatory or infectious in origin. Again free intraperitoneal air is noted scattered throughout the peritoneal cavity. Hepatobiliary: The liver is unremarkable in the unenhanced state. There is a small amount of perihepatic fluid present. A single gallstone is noted within the gallbladder. The gallbladder is not distended and no gallbladder wall thickening is seen. There may be sludge layering within the gallbladder is well. Pancreas: The pancreas is normal in size and the pancreatic duct is not dilated. Spleen: Spleen is unremarkable. However there is perisplenic fluid present. Adrenals/Urinary Tract: The right adrenal gland is unremarkable. The left adrenal adrenal gland is not well seen and may have been resected at the time of left nephrectomy. There is a line of surgical sutures noted within the left retroperitoneum medially. Stomach/Bowel: The  stomach is slightly distended with fluid with no obvious abnormality. No small bowel dilatation or edema is seen. There is a higher attenuation collection adjacent to the junction of the descending colon and proximal sigmoid colon measuring 2.5 x 3.0 cm. This has attenuation of 66 HU and may represent blood. There are also at least 2 other collections of higher attenuation fluid in the left pelvis suspicious for blood as well. If surgery was 1 week ago then blood from that surgical intervention would most likely be lower in attenuation at this point. Etiology of these collections of blood is uncertain and clinical correlation is recommended. At least 2 surgical sutures are noted within the left  pelvis near these collections. The terminal ileum and the appendix are unremarkable. Vascular/Lymphatic: The abdominal aorta is normal caliber with mild abdominal aortic atherosclerosis noted. No adenopathy is seen. Reproductive: The prostate is slightly prominent measuring 4.4 x 5.1 cm. Other: Strandiness is noted in the left lower quadrant subcutaneous soft tissues most likely due to the recent surgery. Musculoskeletal: There are degenerative changes noted in both hips. The lumbar spine are in normal alignment with normal intervertebral disc spaces. Diffuse anterior osteophyte formation is noted. IMPRESSION: 1. In this patient who reportedly underwent left nephrectomy 1 week ago, there are several small collections of higher attenuation fluid within the left lower quadrant-left pelvis. These collections are most consistent with blood which generally would be low in attenuation 1 week after surgery. Clinical correlation is recommended. 2. Single small calcified gallstone. 3. Scattered bubbles of free air throughout the peritoneal and to a lesser degree retroperitoneal spaces most likely due to recent surgery. 4. Some fluid within the peritoneal cavity most likely postoperative as well. Electronically Signed   By: Ivar Drape  M.D.   On: 06/05/2018 13:37   Dg Abd Acute W/chest  Result Date: 06/05/2018 CLINICAL DATA:  Nausea, left nephrectomy a week ago EXAM: DG ABDOMEN ACUTE W/ 1V CHEST COMPARISON:  CT abdomen pelvis of 04/04/2018 and chest x-ray of 12/28/2017 FINDINGS: No active infiltrate or effusion is seen. The does appear to be lucency on the right right hemidiaphragm consistent with free air most likely due to recent surgery. Mediastinal and hilar contours are unremarkable and mild cardiomegaly is stable. There are degenerative changes in the thoracic spine and in both shoulders. Supine and erect views the abdomen show no definite evidence of bowel obstruction. There is bowel gas within primarily small bowel without distension, its may be due to mild ileus pattern. Some bowel gas is present within the colon. There is free intraperitoneal air most likely due to recent surgical intervention. Clinical correlation is recommended. There are degenerative changes in the lower lumbar spine and in both hips. No opaque calculi are seen. IMPRESSION: 1. Free intraperitoneal air. This may well be related to recent surgery, but correlate clinically. 2. Probable ileus.  No definite bowel obstruction. 3. Degenerative change in both hips and in the lower lumbar spine. 4. Suboptimal inspiration but no active lung disease. Electronically Signed   By: Ivar Drape M.D.   On: 06/05/2018 09:24    EKG: Orders placed or performed during the hospital encounter of 06/05/18  . EKG 12-Lead  . EKG 12-Lead    IMPRESSION AND PLAN: *Acute probable postoperative ileus Admit to regular nursing for bed, n.p.o. except for meds, antiemetics PRN, per Dr. Ezequiel Kayser surgery-no intervention recommended, serial abdominal examinations, and continue close medical monitoring  *Chronic end-stage renal disease On hemodialysis Tuesday/Thursday/Saturdays-missed treatment on today Nephrology consulted for hemodialysis needs  *Acute accelerated  hypertension Most likely secondary to inability to tolerate p.o. meds Continue home meds, IV Lopressor as needed systolic blood pressure greater than 160 with hold parameters, vitals per routine, make changes as per necessary  *Status post recent left nephrectomy for kidney cancer 1 week ago Acute abnormal CT of the abdomen noted General surgery consulted for expert opinion    All the records are reviewed and case discussed with ED provider. Management plans discussed with the patient, family and they are in agreement.  CODE STATUS:full Code Status History    Date Active Date Inactive Code Status Order ID Comments User Context   05/30/2018 1702 06/02/2018 1526  Full Code [id number]  Lattie Corns Inpatient   11/24/2017 1334 11/25/2017 1352 Full Code [id number]  Iline Oven Inpatient   11/08/2017 2309 11/09/2017 2239 Full Code [id number]  Elwin Mocha, MD ED   02/25/2017 2301 02/27/2017 1755 Full Code [id number]  Zada Finders, MD ED   09/19/2015 0439 09/21/2015 2010 Full Code [id number]  Juluis Mire, MD ED   12/06/2014 0106 12/08/2014 1953 Full Code [id number]  Jerrye Noble, MD Inpatient   11/13/2014 1451 11/19/2014 1516 Full Code [id number]  Jones Bales, MD Inpatient   08/20/2014 1318 08/23/2014 1758 Full Code [id number]  Corky Sox, MD Inpatient   01/20/2014 2359 01/21/2014 1524 Full Code [id number]  Hester Mates, MD Inpatient   07/10/2013 0512 07/13/2013 1653 Full Code [id number]  Oswald Hillock, MD Inpatient   07/28/2012 2257 07/29/2012 1728 Full Code [id number]  Karlyn Agee, MD Inpatient   07/04/2012 0159 07/05/2012 1607 Full Code [id number]  Karlyn Agee, MD Inpatient       TOTAL TIME TAKING CARE OF THIS PATIENT: 45 minutes.    Avel Peace Salary M.D on 06/05/2018   Between 7am to 6pm - Pager - [phone]  After 6pm go to [email or web address] - password [email or web address] [facility]  Office  [phone]  CC: Primary care physician; Lucious Groves,  DO   Note: This dictation was prepared with Dragon dictation along with smaller phrase technology. Any transcriptional errors that result from this process are unintentional.

## 2018-06-05 NOTE — Progress Notes (Addendum)
Pt arrived from ED. BP noted to be signifanctly elevated in the ED. Systolic currently between 190-200's. Spoke with admitting MD who previously ordered PRN metoprolol which was given. Also, gave daily scheduled medications. BP currently 180/92. MD states that this is acceptable and the patient will need HD today to correct this. Troponin also was elevated this am but it was never checked again. MD states he does not want to order further testing. Also spoke with on call nephrologist who has placed HD orders for this evening.

## 2018-06-05 NOTE — ED Notes (Addendum)
Pt given ginger ale at this time.  

## 2018-06-05 NOTE — ED Notes (Signed)
Dialysis pt, Tu, Thur, Sat. Last dialysis Saturday, 06/02/18.

## 2018-06-05 NOTE — Progress Notes (Signed)
Family Meeting Note  Advance Directive:yes  Today a meeting took place with the Patient.  Patient is able to participate   The following clinical team members were present during this meeting:MD  The following were discussed:Patient's diagnosis: Acute postop ileus, Patient's progosis: Unable to determine and Goals for treatment: Full Code  Additional follow-up to be provided: prn  Time spent during discussion:20 minutes  Gorden Harms, MD

## 2018-06-05 NOTE — Progress Notes (Signed)
HD tx start    06/05/18 2025  Vital Signs  Pulse Rate 85  Pulse Rate Source Monitor  Resp 15  BP (!) 154/73  BP Location Right Arm  BP Method Automatic  Patient Position (if appropriate) Lying  Oxygen Therapy  SpO2 99 %  O2 Device Room Air  During Hemodialysis Assessment  Blood Flow Rate (mL/min) 400 mL/min  Arterial Pressure (mmHg) -140 mmHg  Venous Pressure (mmHg) 220 mmHg  Transmembrane Pressure (mmHg) 70 mmHg  Ultrafiltration Rate (mL/min) 860 mL/min  Dialysate Flow Rate (mL/min) 800 ml/min  Conductivity: Machine  14.1  HD Safety Checks Performed Yes  Dialysis Fluid Bolus Normal Saline  Bolus Amount (mL) 250 mL  Intra-Hemodialysis Comments Tx initiated  Fistula / Graft Left Forearm Arteriovenous vein graft  Placement Date/Time: 11/24/17 1041   Placed prior to admission: No  Orientation: Left  Access Location: Forearm  Access Type: (c) Arteriovenous vein graft  Status Accessed  Needle Size 15

## 2018-06-05 NOTE — ED Notes (Signed)
First Nurse Note: Patient assisted into WC from [place].  States he had a "kidney removed" recently due to cancer.  Uses walker that he brought with him.

## 2018-06-05 NOTE — ED Triage Notes (Signed)
Pt here for nausea, "since released from hospital last week." Pt states he had his kidney removed last Wed at [facility]. Spitting in basin during triage, unable to vomit at this time. Denies pain/cp/sob. Appears in NAD.

## 2018-06-05 NOTE — Progress Notes (Signed)
Pre HD assessment   06/05/18 2012  Vital Signs  Temp 98.7 F (37.1 C)  Temp Source Oral  Pulse Rate 83  Pulse Rate Source Monitor  BP (!) 146/66  BP Location Right Arm  BP Method Automatic  Patient Position (if appropriate) Lying  Oxygen Therapy  SpO2 100 %  O2 Device Room Air  Pain Assessment  Pain Scale 0-10  Pain Score 6  Pain Type Chronic pain  Pain Location Foot  Pain Orientation Right  Pain Descriptors / Indicators Aching  Pain Onset On-going  Pain Intervention(s) RN made aware  Dialysis Weight  Weight 96 kg  Type of Weight Pre-Dialysis  Time-Out for Hemodialysis  What Procedure? HD  Pt Identifiers(min of two) First/Last Name;MRN/Account#  Correct Site? Yes  Correct Side? Yes  Correct Procedure? Yes  Consents Verified? Yes  Rad Studies Available? N/A  Safety Precautions Reviewed? Yes  Engineer, civil (consulting) Number  ([id number])  Station Number 3  UF/Alarm Test Passed  Conductivity: Meter 13.8  Conductivity: Machine  14  pH 7.6  Reverse Osmosis main  Normal Saline Lot Number [id number]  Dialyzer Lot Number [id number]  Disposable Set Lot Number [id number]  Machine Temperature 98.6 F (37 C)  Musician and Audible Yes  Blood Lines Intact and Secured Yes  Pre Treatment Patient Checks  Vascular access used during treatment Graft  Hepatitis B Surface Antigen Results  (unk )  Date Hepatitis B Surface Antigen Drawn 06/05/18  Isolation Initiated Yes  Hepatitis B Surface Antibody  (unk)  Date Hepatitis B Surface Antibody Drawn 06/05/18  Hemodialysis Consent Verified Yes  Hemodialysis Standing Orders Initiated Yes  ECG (Telemetry) Monitor On Yes  Prime Ordered Normal Saline  Length of  DialysisTreatment -hour(s) 3.5 Hour(s)  Dialyzer Elisio 17H NR  Dialysate 2K, 2.5 Ca  Dialysis Anticoagulant None  Dialysate Flow Ordered 800  Blood Flow Rate Ordered 400 mL/min  Ultrafiltration Goal 2.5 Liters  Pre Treatment Labs Phosphorus (PTH, HIV)  Dialysis Blood  Pressure Support Ordered Normal Saline  Education / Care Plan  Dialysis Education Provided Yes  Documented Education in Care Plan Yes  Fistula / Graft Left Forearm Arteriovenous vein graft  Placement Date/Time: 11/24/17 1041   Placed prior to admission: No  Orientation: Left  Access Location: Forearm  Access Type: (c) Arteriovenous vein graft  Site Condition No complications  Fistula / Graft Assessment Present;Thrill;Bruit  Drainage Description None

## 2018-06-05 NOTE — Progress Notes (Signed)
Pre HD assessment    06/05/18 2013  Neurological  Level of Consciousness Alert  Orientation Level Oriented X4  Respiratory  Respiratory Pattern Tachypnea  Chest Assessment Chest expansion symmetrical  Cardiac  ECG Monitor Yes  Cardiac Rhythm NSR  Ectopy Multifocal PVC's  Ectopy Frequency Frequent  Vascular  R Radial Pulse +2  L Radial Pulse +2  Edema Generalized;Right lower extremity;Left lower extremity  Integumentary  Integumentary (WDL) X  Skin Color Appropriate for ethnicity  Skin Integrity Surgical Incision (see LDA)  Musculoskeletal  Musculoskeletal (WDL) X  Generalized Weakness Yes  Assistive Device None  GU Assessment  Genitourinary (WDL) X  Genitourinary Symptoms  (HD)  Psychosocial  Psychosocial (WDL) WDL  Patient Behaviors Cooperative;Calm;Restless;Sad  Needs Expressed Physical  Emotional support given Given to patient

## 2018-06-05 NOTE — ED Provider Notes (Addendum)
[facility] Emergency Department Provider Note ____________________________________________   I have reviewed the triage vital signs and the triage nursing note.  HISTORY  Chief Complaint Nausea   Historian Patient  HPI Charles Hart is a 62 y.o. male presents to the ED for nausea for 24 hours with watery emesis.  No diarrhea.  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  Reports no problems with abdominal pain.  He is had one bowel movement since the surgery.  He is a dialysis patient, last dialyzed on Saturday.  He is reporting 1 urine output in the last 3 days.  No new medications.  No chest pain.  Denies shortness of breath or coughing.  Denies fevers.     Past Medical History:  Diagnosis Date  . Adenomatous colon polyp 07/02/2011   Last colonoscopy May 06, 2011 by Dr. Owens Loffler, who recommended repeat colonoscopy in 5 years.   . Anemia   . Background diabetic retinopathy 04/20/2012   Patient is followed by Dr. Katy Fitch   . Cardiomyopathy    LV function improved from 2004 to 2008.  Historically, moderately dilated LV with EF 30-40% by 2D echo 08/14/2002.  Mild CAD with severe LV dysfunction by cardiac cath 09/2002.  Normal coronary arteries and normal LV function by cardiac cath 09/19/2006.  A 2-D echo on 04/01/2009 showed mild concentric hypertrophy and normal systolic (LVEF  24-40%) and doppler C/W with grade 1 diastolic dysfunction.  . CHF (congestive heart failure) ([place])    LV function improved from 2004 to 2008.  Historically, moderately dilated LV with EF 30-40% by 2D echo 08/14/2002.  Mild CAD with severe LV dysfunction by cardiac cath 09/2002.  Normal coronary arteries and normal LV function by cardiac cath 09/19/2006.  A 2-D echo on 04/01/2009 showed mild concentric hypertrophy and normal systolic (LVEF  10-27%) and doppler C/W with grade 1 diastolic dysfunction..   . Chronic combined  systolic and diastolic congestive heart failure ([place]) 05/21/2010   LV function improved from 2004 to 2008.  Historically, moderately dilated LV with EF 30-40% by 2D echo 08/14/2002.  Mild CAD with severe LV dysfunction by cardiac cath 09/2002.  Normal coronary arteries and normal LV function by cardiac cath 09/19/2006.  A 2-D echo on 04/01/2009 showed mild concentric hypertrophy and normal systolic (LVEF  25-36%) and doppler parameters consistent with abnormal left   . CVA (cerebral vascular accident) ([place]) 07/04/2012   MRI of the brain 07/04/2012 showed an acute infarct in the right basal ganglia involving the anterior putamen, anterior limb internal capsule, and head of the caudate; this measured approximately 2.5 cm in diameter.     . Dermatitis   . Diabetes mellitus    type 2  . DIABETIC PERIPHERAL NEUROPATHY 08/03/2007   Qualifier: Diagnosis of  By: Marinda Elk MD, Sonia Side    . DM neuropathy, painful (HCC)    fingers and right knee  . ESRD (end stage renal disease) ([place])    Stage 4, on hemodailysis x 4 months as of 05-18-18 [facility], tues thurs sat  . ESRD (end stage renal disease) on dialysis ([place])    "TTS; [facility]" (05/31/2018)  . Hearing loss in right ear   . Hyperlipidemia   . Hypertension   . Hypertensive crisis 07/28/2012  . Hypertensive urgency 08/20/2014  . Myocardial infarction [facility])  many yrs ago  . Nephrotic syndrome 02/18/2013   A 24-hour urine collection 03/04/2013 showed  total protein of 5,460 g and creatinine clearance of 80 mL/minute.  Patient was seen by Seward Meth at [place] and a repeat 24-hour urine showed 10,407 mg protein.  Patient underwent kidney biopsy on 05/30/2013; pathology showed advanced diffuse and nodular diabetic nephropathy with vascular changes consistent with long-standing difficult to control hypertension.     . Pneumonia 2014 and 2015  . Renal insufficiency   . Sleep apnea    uses cpap "sometimes per pt"     Patient Active Problem List   Diagnosis Date Noted  . Renal mass 05/30/2018  . Trochanteric bursitis of right hip 04/25/2018  . Aortic atherosclerosis ([place]) 12/07/2017  . ESRD on hemodialysis ([place]) 11/24/2017  . S/P arteriovenous (AV) fistula creation 11/24/2017  . Dyspnea 11/08/2017  . Left renal mass 09/06/2017  . Acute on chronic combined systolic and diastolic CHF (congestive heart failure) ([place]) 02/25/2017  . Piriformis syndrome of right side 03/03/2016  . Muscle spasm of left shoulder area 03/03/2016  . Secondary hyperparathyroidism, renal ([place]) 02/09/2016  . Primary osteoarthritis of left knee 01/07/2016  . Low back pain 01/07/2016  . Joint stiffness 11/13/2015  . Age-related nuclear cataract of both eyes 10/07/2015  . Normocytic anemia 08/22/2014  . Health care maintenance 05/16/2014  . Nephrotic syndrome in diseases classified elsewhere 02/18/2013  . Obstructive sleep apnea 07/26/2012  . History of stroke 07/04/2012  . 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 ED (erectile dysfunction) 04/18/2012  . Benign neoplasm of colon 07/02/2011  . Chronic combined systolic and diastolic congestive heart failure ([place]) 05/21/2010  . Carotid bruit 04/01/2009  . Type 2 diabetes mellitus with diabetic neuropathy ([place]) 08/03/2007  . Hearing loss 07/30/2007  . Controlled type 2 diabetes mellitus with chronic kidney disease ([place]) 09/15/2006  . Other and unspecified hyperlipidemia 06/01/2006  . Essential (primary) hypertension 06/01/2006    Past Surgical History:  Procedure Laterality Date  . AV FISTULA PLACEMENT Left 11/24/2017   Procedure: INSERTION OF ARTERIOVENOUS (AV) GORE-TEX GRAFT LEFT LOWER ARM;  Surgeon: Rosetta Posner, MD;  Location: [facility];  Service: Vascular;  Laterality: Left;  . CARDIAC CATHETERIZATION      4 times  . COLONOSCOPY    . FOOT SURGERY    . POLYPECTOMY    . ROBOT ASSISTED LAPAROSCOPIC NEPHRECTOMY Left 05/30/2018    Procedure: XI ROBOTIC ASSISTED LAPAROSCOPIC NEPHRECTOMY;  Surgeon: Alexis Frock, MD;  Location: [facility];  Service: Urology;  Laterality: Left;    Prior to Admission medications   Medication Sig Start Date End Date Taking? Authorizing Provider  calcium acetate (PHOSLO) 667 MG capsule Take 1,334 mg by mouth 3 (three) times daily with meals.  12/15/17  Yes [place], Caren Griffins, MD  CARTIA XT 180 MG 24 hr capsule TAKE 1 CAPSULE(180 MG) BY MOUTH DAILY Patient taking differently: Take 180 mg by mouth daily. TAKE 1 CAPSULE(180 MG) BY MOUTH DAILY 09/07/17  Yes Lucious Groves, DO  carvedilol (COREG) 25 MG tablet Take 25 mg by mouth 2 (two) times daily with a meal.  09/06/17  Yes [provider]  clopidogrel (PLAVIX) 75 MG tablet Take 75 mg by mouth daily.   Yes [provider]  fluticasone (FLONASE) 50 MCG/ACT nasal spray Place 2 sprays daily into both nostrils. Patient taking differently: Place 2 sprays into both nostrils as needed for allergies.  07/01/17  Yes Noemi Chapel, MD  folic acid-vitamin b complex-vitamin c-selenium-zinc (DIALYVITE) 3 MG TABS tablet Take 1  tablet by mouth daily.   Yes [provider]  furosemide (LASIX) 80 MG tablet Take 80 mg by mouth 2 (two) times daily.   Yes [provider]  gabapentin (NEURONTIN) 300 MG capsule Take 300 mg by mouth at bedtime.   Yes [provider]  hydrALAZINE (APRESOLINE) 100 MG tablet Take 1 tablet (100 mg total) by mouth 3 (three) times daily. 09/07/17 09/16/18 Yes Lucious Groves, DO  oxyCODONE (OXY IR/ROXICODONE) 5 MG immediate release tablet Take 1 tablet (5 mg total) by mouth every 4 (four) hours as needed for moderate pain. 06/02/18  Yes McKenzie, Candee Furbish, MD  pravastatin (PRAVACHOL) 40 MG tablet Take 1 tablet (40 mg total) by mouth daily. 09/07/17  Yes Lucious Groves, DO  promethazine (PHENERGAN) 25 MG tablet Take 1 tablet (25 mg total) by mouth every 6 (six) hours as needed for nausea or vomiting. 12/07/17   Yes Lucious Groves, DO  HYDROcodone-acetaminophen (NORCO/VICODIN) 5-325 MG tablet Take 1-2 tablets by mouth every 4 (four) hours as needed for moderate pain. Patient not taking: Reported on 06/05/2018 05/30/18   Debbrah Alar, PA-C  Insulin Pen Needle (NOVOFINE PLUS) 32G X 4 MM MISC 1 Dose by Does not apply route daily. For Victoza, substitution ok for cheaper brand Patient not taking: Reported on 06/05/2018 09/08/17   Lucious Groves, DO  lidocaine (LIDODERM) 5 % Place 1 patch onto the skin daily. Remove & Discard patch within 12 hours or as directed by MD Patient not taking: Reported on 06/05/2018 04/25/18 04/25/19  Lorella Nimrod, MD  liraglutide (VICTOZA) 18 MG/3ML SOPN Inject 0.1 mLs (0.6 mg total) into the skin daily. Patient not taking: Reported on 06/05/2018 12/07/17   Lucious Groves, DO  senna (SENOKOT) 8.6 MG TABS tablet Take 1 tablet (8.6 mg total) by mouth 2 (two) times daily. While taking strong pain meds to prevent constipation Patient not taking: Reported on 06/05/2018 06/01/18   Alexis Frock, MD    Allergies  Allergen Reactions  . Ivp Dye [Iodinated Diagnostic Agents] Other (See Comments)    Per patient's Nephrologist, he doesn't want the patient exposed to ANY dye because of issues with his kidneys  . Hydrocodone     Caused hands and feet to peel   . Amlodipine Swelling  . Lisinopril Cough  . Red Dye Other (See Comments)    NO dye of any kind (issues with his kidneys)    Family History  Problem Relation Age of Onset  . Aneurysm Father 55       died of rupture  . Colon cancer Sister     Social History Social History   Tobacco Use  . Smoking status: Never Smoker  . Smokeless tobacco: Never Used  Substance Use Topics  . Alcohol use: No    Alcohol/week: 0.0 standard drinks  . Drug use: No    Review of Systems  Constitutional: Negative for fever. Eyes: Negative for visual changes. ENT: Negative for sore throat. Cardiovascular: Negative for chest  pain. Respiratory: Negative for shortness of breath. Gastrointestinal: Negative for abdominal pain. Genitourinary: Negative for dysuria. Musculoskeletal: Negative for back pain. Skin: Negative for rash. Neurological: Negative for headache.  ____________________________________________   PHYSICAL EXAM:  VITAL SIGNS: ED Triage Vitals  Enc Vitals Group     BP 06/05/18 0737 (!) 217/94     Pulse Rate 06/05/18 0736 92     Resp 06/05/18 0736 20     Temp 06/05/18 0736 99.5 F (37.5 C)  Temp Source 06/05/18 0736 Oral     SpO2 06/05/18 0736 99 %     Weight 06/05/18 0736 196 lb (88.9 kg)     Height 06/05/18 0736 6' (1.829 m)     Head Circumference --      Peak Flow --      Pain Score 06/05/18 0736 0     Pain Loc --      Pain Edu? --      Excl. in [place]? --      Constitutional: Alert and oriented.  HEENT      Head: Normocephalic and atraumatic.      Eyes: Conjunctivae are normal. Pupils equal and round.       Ears:         Nose: No congestion/rhinnorhea.      Mouth/Throat: Mucous membranes are moist.  He is spitting up small amounts of spit.     Neck: No stridor. Cardiovascular/Chest: Normal rate, regular rhythm.  No murmurs, rubs, or gallops. Respiratory: Normal respiratory effort without tachypnea nor retractions. Breath sounds are clear and equal bilaterally. No wheezes/rales/rhonchi. Gastrointestinal: Soft. No distention, no guarding, no rebound. Nontender.  Low midline vertical incision that is clean dry and intact. Genitourinary/rectal:Deferred Musculoskeletal: Nontender with normal range of motion in all extremities. No joint effusions.  No lower extremity tenderness.  No edema. Neurologic:  Normal speech and language. No gross or focal neurologic deficits are appreciated. Skin:  Skin is warm, dry and intact. No rash noted. Psychiatric: Mood and affect are normal. Speech and behavior are normal. Patient exhibits appropriate insight and  judgment.   ____________________________________________  LABS (pertinent positives/negatives) I, Lisa Roca, MD the attending physician have reviewed the labs noted below.  Labs Reviewed  COMPREHENSIVE METABOLIC PANEL - Abnormal; Notable for the following components:      Result Value   Chloride 97 (*)    Glucose, Bld 120 (*)    BUN 62 (*)    Creatinine, Ser 11.42 (*)    Calcium 8.7 (*)    Total Bilirubin 1.4 (*)    GFR calc non Af Amer 4 (*)    GFR calc Af Amer 5 (*)    All other components within normal limits  CBC - Abnormal; Notable for the following components:   RBC 3.51 (*)    Hemoglobin 10.3 (*)    HCT 31.1 (*)    All other components within normal limits  URINALYSIS, COMPLETE (UACMP) WITH MICROSCOPIC - Abnormal; Notable for the following components:   Color, Urine AMBER (*)    APPearance HAZY (*)    Hgb urine dipstick MODERATE (*)    Protein, ur 100 (*)    Leukocytes, UA SMALL (*)    Bacteria, UA RARE (*)    All other components within normal limits  TROPONIN I - Abnormal; Notable for the following components:   Troponin I 0.06 (*)    All other components within normal limits  LIPASE, BLOOD  HEPATITIS B SURFACE ANTIGEN  HEPATITIS B SURFACE ANTIBODY, QUANTITATIVE    ____________________________________________    EKG I, Lisa Roca, MD, the attending physician have personally viewed and interpreted all ECGs.  88 bpm.  Normal sinus rhythm.  LVH.  Nonspecific intraventricular conduction delay.  Inferior and lateral ST/T wave abnormalities which are similar to prior. ____________________________________________  RADIOLOGY   Ct abd pel without contrast:  FINDINGS: Lower chest: There has been slight increase in bibasilar linear atelectasis. A small nodular opacity posteriorly in the left lower lobe on  image 10 series 3 may be due to atelectasis but attention to this area on follow-up chest x-ray is recommended. This nodule was not present on chest  x-ray of 04/04/2018 and therefore most likely is inflammatory or infectious in origin. Again free intraperitoneal air is noted scattered throughout the peritoneal cavity.  Hepatobiliary: The liver is unremarkable in the unenhanced state. There is a small amount of perihepatic fluid present. A single gallstone is noted within the gallbladder. The gallbladder is not distended and no gallbladder wall thickening is seen. There may be sludge layering within the gallbladder is well.  Pancreas: The pancreas is normal in size and the pancreatic duct is not dilated.  Spleen: Spleen is unremarkable. However there is perisplenic fluid present.  Adrenals/Urinary Tract: The right adrenal gland is unremarkable. The left adrenal adrenal gland is not well seen and may have been resected at the time of left nephrectomy. There is a line of surgical sutures noted within the left retroperitoneum medially.  Stomach/Bowel: The stomach is slightly distended with fluid with no obvious abnormality. No small bowel dilatation or edema is seen. There is a higher attenuation collection adjacent to the junction of the descending colon and proximal sigmoid colon measuring 2.5 x 3.0 cm. This has attenuation of 66 HU and may represent blood. There are also at least 2 other collections of higher attenuation fluid in the left pelvis suspicious for blood as well. If surgery was 1 week ago then blood from that surgical intervention would most likely be lower in attenuation at this point. Etiology of these collections of blood is uncertain and clinical correlation is recommended. At least 2 surgical sutures are noted within the left pelvis near these collections. The terminal ileum and the appendix are unremarkable.  Vascular/Lymphatic: The abdominal aorta is normal caliber with mild abdominal aortic atherosclerosis noted. No adenopathy is seen.  Reproductive: The prostate is slightly prominent measuring 4.4 x  5.1 cm.  Other: Strandiness is noted in the left lower quadrant subcutaneous soft tissues most likely due to the recent surgery.  Musculoskeletal: There are degenerative changes noted in both hips. The lumbar spine are in normal alignment with normal intervertebral disc spaces. Diffuse anterior osteophyte formation is noted.  IMPRESSION: 1. In this patient who reportedly underwent left nephrectomy 1 week ago, there are several small collections of higher attenuation fluid within the left lower quadrant-left pelvis. These collections are most consistent with blood which generally would be low in attenuation 1 week after surgery. Clinical correlation is recommended. 2. Single small calcified gallstone. 3. Scattered bubbles of free air throughout the peritoneal and to a lesser degree retroperitoneal spaces most likely due to recent surgery. 4. Some fluid within the peritoneal cavity most likely postoperative as well. __________________________________________  PROCEDURES  Procedure(s) performed: None  Procedures  Critical Care performed: None   ____________________________________________  ED COURSE / ASSESSMENT AND PLAN  Pertinent labs & imaging results that were available during my care of the patient were reviewed by me and considered in my medical decision making (see chart for details).     Patient here with ongoing multiple episodes of watery emesis nausea with vomiting.  Patient received multiple doses of Zofran, small IV fluid bolus given concern the patient is a dialysis patient.  Potassium okay in terms of dialysis.  Given the ongoing nausea, concern for the possibility of obstruction as a cause, CT abdomen was obtained, noncontrast due to allergy.  Result negative for obstruction.  No abdominal pain.  I do  not have a concern for postsurgical complication.  Patient continued to have nausea and vomiting, started with IV Reglan at this point.  Will admit to  hospital for observation and treatment of intractable nausea and vomiting.    CONSULTATIONS:  Hospitalist for admission.  Discussed with Dr. Rosana Hoes, reviewed CT scan - small amount pneumoperitoneum, likely still postoperative related, with low concern for clinical complication given no pain, and no elevated wbc ct and no drop in hemoglobin.  Patient / Family / Caregiver informed of clinical course, medical decision-making process, and agree with plan.     ___________________________________________   FINAL CLINICAL IMPRESSION(S) / ED DIAGNOSES   Final diagnoses:  Intractable nausea and vomiting      ___________________________________________         Note: This dictation was prepared with Dragon dictation. Any transcriptional errors that result from this process are unintentional    Lisa Roca, MD 06/05/18 1421    Lisa Roca, MD 06/05/18 [phone]

## 2018-06-06 DIAGNOSIS — K668 Other specified disorders of peritoneum: Secondary | ICD-10-CM

## 2018-06-06 LAB — HEPATITIS B SURFACE ANTIGEN: Hepatitis B Surface Ag: NEGATIVE

## 2018-06-06 LAB — HEPATITIS B SURFACE ANTIBODY, QUANTITATIVE: Hepatitis B-Post: <3.1 m[IU]/mL — ABNORMAL LOW (ref >9.9)

## 2018-06-06 MED ORDER — SODIUM CHLORIDE 0.9% FLUSH
3.0000 mL | Freq: Two times a day (BID) | INTRAVENOUS | Status: DC
  Administered 2018-06-06 (×2): 3 mL via INTRAVENOUS

## 2018-06-06 NOTE — Care Management (Signed)
Charles Hart dialysis liaison notified of admission.    

## 2018-06-06 NOTE — Progress Notes (Signed)
HD tx end    06/06/18 0006  Vital Signs  Pulse Rate 87  Pulse Rate Source Monitor  Resp (!) 23  BP (!) 145/67  BP Location Right Arm  BP Method Automatic  Patient Position (if appropriate) Lying  Oxygen Therapy  SpO2 97 %  O2 Device Room Air  During Hemodialysis Assessment  Dialysis Fluid Bolus Normal Saline  Bolus Amount (mL) 250 mL  Intra-Hemodialysis Comments Tx completed

## 2018-06-06 NOTE — Consult Note (Signed)
[facility]  CONSULT NOTE    Date: 06/06/2018                  Patient Name:  Charles Hart  MRN: [id number]  DOB: 08-30-1955  Age / Sex: 62 y.o., male         PCP: Lucious Groves, DO                 Service Requesting Consult: Dr. Verdell Carmine                 Reason for Consult: End Stage Renal Disease on hemodialysis.             History of Present Illness: [name] is a 62 y.o. black male with end stage renal disease on hemodialysis, obstructive sleep apnea, coronary artery disease, hypertension, hyperlipidemia, diabetes mellitus type II, CVA, congestive heart failure , who was admitted to [facility] on 06/05/2018 for Intractable nausea and vomiting [R11.2]  Patient had left nephrectomy at [facility] on 10/16. He was staying with his sister in [place] until he recovers.   Required hemodialysis treatment overnight. Tolerated treatment well. UF 2242mL.   Tolerating clear liquid diet this morning   Medications: Outpatient medications: Medications Prior to Admission  Medication Sig Dispense Refill Last Dose  . calcium acetate (PHOSLO) 667 MG capsule Take 1,334 mg by mouth 3 (three) times daily with meals.    06/04/2018 at pm  . CARTIA XT 180 MG 24 hr capsule TAKE 1 CAPSULE(180 MG) BY MOUTH DAILY (Patient taking differently: Take 180 mg by mouth daily. TAKE 1 CAPSULE(180 MG) BY MOUTH DAILY) 90 capsule 3 06/04/2018 at am  . carvedilol (COREG) 25 MG tablet Take 25 mg by mouth 2 (two) times daily with a meal.    06/04/2018 at pm  . clopidogrel (PLAVIX) 75 MG tablet Take 75 mg by mouth daily.   06/04/2018 at pm  . fluticasone (FLONASE) 50 MCG/ACT nasal spray Place 2 sprays daily into both nostrils. (Patient taking differently: Place 2 sprays into both nostrils as needed for allergies. ) 16 g 2 unknown at unknown  . folic acid-vitamin b complex-vitamin c-selenium-zinc (DIALYVITE) 3 MG TABS tablet Take 1 tablet by mouth daily.   06/04/2018 at pm  .  furosemide (LASIX) 80 MG tablet Take 80 mg by mouth 2 (two) times daily.   06/04/2018 at pm  . gabapentin (NEURONTIN) 300 MG capsule Take 300 mg by mouth at bedtime.   06/04/2018 at pm  . hydrALAZINE (APRESOLINE) 100 MG tablet Take 1 tablet (100 mg total) by mouth 3 (three) times daily. 270 tablet 3 06/04/2018 at pm  . oxyCODONE (OXY IR/ROXICODONE) 5 MG immediate release tablet Take 1 tablet (5 mg total) by mouth every 4 (four) hours as needed for moderate pain. 30 tablet 0 06/04/2018 at pm  . pravastatin (PRAVACHOL) 40 MG tablet Take 1 tablet (40 mg total) by mouth daily. 90 tablet 3 06/04/2018 at pm  . promethazine (PHENERGAN) 25 MG tablet Take 1 tablet (25 mg total) by mouth every 6 (six) hours as needed for nausea or vomiting. 30 tablet 0 06/04/2018 at pm    Current medications: Current Facility-Administered Medications  Medication Dose Route Frequency Provider Last Rate Last Dose  . acetaminophen (TYLENOL) tablet 650 mg  650 mg Oral Q6H PRN Salary, Montell D, MD       Or  . acetaminophen (TYLENOL) suppository 650 mg  650 mg Rectal Q6H PRN Salary, Montell D,  MD      . b complex-vitamin c-folic acid (NEPHRO-VITE) tablet 1 tablet  1 tablet Oral Daily Salary, Montell D, MD   1 tablet at 06/06/18 1000  . calcium acetate (PHOSLO) capsule 1,334 mg  1,334 mg Oral TID WC Salary, Montell D, MD      . carvedilol (COREG) tablet 25 mg  25 mg Oral BID WC Salary, Montell D, MD   25 mg at 06/06/18 1000  . clopidogrel (PLAVIX) tablet 75 mg  75 mg Oral Daily Salary, Montell D, MD   75 mg at 06/06/18 1000  . diltiazem (CARDIZEM CD) 24 hr capsule 180 mg  180 mg Oral Daily Salary, Montell D, MD   180 mg at 06/06/18 1004  . fluticasone (FLONASE) 50 MCG/ACT nasal spray 2 spray  2 spray Each Nare PRN Salary, Montell D, MD      . furosemide (LASIX) tablet 80 mg  80 mg Oral BID Salary, Montell D, MD   80 mg at 06/06/18 1000  . gabapentin (NEURONTIN) capsule 300 mg  300 mg Oral QHS Salary, Montell D, MD   300 mg at  06/05/18 2053  . heparin injection 5,000 Units  5,000 Units Subcutaneous Q8H Salary, Montell D, MD   Stopped at 06/05/18 1944  . hydrALAZINE (APRESOLINE) tablet 100 mg  100 mg Oral TID Loney Hering D, MD   100 mg at 06/06/18 1131  . metoprolol tartrate (LOPRESSOR) injection 5 mg  5 mg Intravenous Q6H PRN Salary, Montell D, MD   5 mg at 06/05/18 1602  . ondansetron (ZOFRAN) tablet 4 mg  4 mg Oral Q6H PRN Salary, Montell D, MD       Or  . ondansetron (ZOFRAN) injection 4 mg  4 mg Intravenous Q6H PRN Salary, Montell D, MD   4 mg at 06/05/18 2053  . oxyCODONE (Oxy IR/ROXICODONE) immediate release tablet 5 mg  5 mg Oral Q8H PRN Salary, Montell D, MD   5 mg at 06/05/18 1742  . pravastatin (PRAVACHOL) tablet 40 mg  40 mg Oral Daily Salary, Montell D, MD   40 mg at 06/06/18 1000  . promethazine (PHENERGAN) tablet 25 mg  25 mg Oral Q6H PRN Loney Hering D, MD   25 mg at 06/06/18 0119  . sodium chloride flush (NS) 0.9 % injection 3 mL  3 mL Intravenous Q12H Salary, Montell D, MD   3 mL at 06/06/18 1001      Allergies: Allergies  Allergen Reactions  . Ivp Dye [Iodinated Diagnostic Agents] Other (See Comments)    Per patient's Nephrologist, he doesn't want the patient exposed to ANY dye because of issues with his kidneys  . Hydrocodone     Caused hands and feet to peel   . Amlodipine Swelling  . Lisinopril Cough  . Red Dye Other (See Comments)    NO dye of any kind (issues with his kidneys)      Past Medical History: Past Medical History:  Diagnosis Date  . Adenomatous colon polyp 07/02/2011   Last colonoscopy May 06, 2011 by Dr. Owens Loffler, who recommended repeat colonoscopy in 5 years.   . Anemia   . Background diabetic retinopathy 04/20/2012   Patient is followed by Dr. Katy Fitch   . Cardiomyopathy    LV function improved from 2004 to 2008.  Historically, moderately dilated LV with EF 30-40% by 2D echo 08/14/2002.  Mild CAD with severe LV dysfunction by cardiac cath 09/2002.   Normal coronary arteries and normal LV function  by cardiac cath 09/19/2006.  A 2-D echo on 04/01/2009 showed mild concentric hypertrophy and normal systolic (LVEF  95-63%) and doppler C/W with grade 1 diastolic dysfunction.  . CHF (congestive heart failure) ([place])    LV function improved from 2004 to 2008.  Historically, moderately dilated LV with EF 30-40% by 2D echo 08/14/2002.  Mild CAD with severe LV dysfunction by cardiac cath 09/2002.  Normal coronary arteries and normal LV function by cardiac cath 09/19/2006.  A 2-D echo on 04/01/2009 showed mild concentric hypertrophy and normal systolic (LVEF  87-56%) and doppler C/W with grade 1 diastolic dysfunction..   . Chronic combined systolic and diastolic congestive heart failure ([place]) 05/21/2010   LV function improved from 2004 to 2008.  Historically, moderately dilated LV with EF 30-40% by 2D echo 08/14/2002.  Mild CAD with severe LV dysfunction by cardiac cath 09/2002.  Normal coronary arteries and normal LV function by cardiac cath 09/19/2006.  A 2-D echo on 04/01/2009 showed mild concentric hypertrophy and normal systolic (LVEF  43-32%) and doppler parameters consistent with abnormal left   . CVA (cerebral vascular accident) ([place]) 07/04/2012   MRI of the brain 07/04/2012 showed an acute infarct in the right basal ganglia involving the anterior putamen, anterior limb internal capsule, and head of the caudate; this measured approximately 2.5 cm in diameter.     . Dermatitis   . Diabetes mellitus    type 2  . DIABETIC PERIPHERAL NEUROPATHY 08/03/2007   Qualifier: Diagnosis of  By: Marinda Elk MD, Sonia Side    . DM neuropathy, painful (HCC)    fingers and right knee  . ESRD (end stage renal disease) ([place])    Stage 4, on hemodailysis x 4 months as of 05-18-18 [place] [facility], tues thurs sat  . ESRD (end stage renal disease) on dialysis ([place])    "TTS; [facility]" (05/31/2018)  . Hearing loss in right ear   . Hyperlipidemia   . Hypertension   . Hypertensive  crisis 07/28/2012  . Hypertensive urgency 08/20/2014  . Myocardial infarction [facility])  many yrs ago  . Nephrotic syndrome 02/18/2013   A 24-hour urine collection 03/04/2013 showed total protein of 5,460 g and creatinine clearance of 80 mL/minute.  Patient was seen by Seward Meth at [place] and a repeat 24-hour urine showed 10,407 mg protein.  Patient underwent kidney biopsy on 05/30/2013; pathology showed advanced diffuse and nodular diabetic nephropathy with vascular changes consistent with long-standing difficult to control hypertension.     . Pneumonia 2014 and 2015  . Renal insufficiency   . Sleep apnea    uses cpap "sometimes per pt"     Past Surgical History: Past Surgical History:  Procedure Laterality Date  . AV FISTULA PLACEMENT Left 11/24/2017   Procedure: INSERTION OF ARTERIOVENOUS (AV) GORE-TEX GRAFT LEFT LOWER ARM;  Surgeon: Rosetta Posner, MD;  Location: [facility];  Service: Vascular;  Laterality: Left;  . CARDIAC CATHETERIZATION      4 times  . COLONOSCOPY    . FOOT SURGERY    . POLYPECTOMY    . ROBOT ASSISTED LAPAROSCOPIC NEPHRECTOMY Left 05/30/2018   Procedure: XI ROBOTIC ASSISTED LAPAROSCOPIC NEPHRECTOMY;  Surgeon: Alexis Frock, MD;  Location: [facility];  Service: Urology;  Laterality: Left;     Family History: Family History  Problem Relation Age of Onset  . Aneurysm Father 71       died of rupture  . Colon cancer Sister      Social History: Social History  Socioeconomic History  . Marital status: Divorced    Spouse name: Not on file  . Number of children: Not on file  . Years of education: Not on file  . Highest education level: Not on file  Occupational History  . Not on file  Social Needs  . Financial resource strain: Not on file  . Food insecurity:    Worry: Not on file    Inability: Not on file  . Transportation needs:    Medical: Not on file    Non-medical: Not on file  Tobacco Use  . Smoking status: Never Smoker   . Smokeless tobacco: Never Used  Substance and Sexual Activity  . Alcohol use: No    Alcohol/week: 0.0 standard drinks  . Drug use: No  . Sexual activity: Not on file  Lifestyle  . Physical activity:    Days per week: Not on file    Minutes per session: Not on file  . Stress: Not on file  Relationships  . Social connections:    Talks on phone: Not on file    Gets together: Not on file    Attends religious service: Not on file    Active member of club or organization: Not on file    Attends meetings of clubs or organizations: Not on file    Relationship status: Not on file  . Intimate partner violence:    Fear of current or ex partner: Not on file    Emotionally abused: Not on file    Physically abused: Not on file    Forced sexual activity: Not on file  Other Topics Concern  . Not on file  Social History Narrative  . Not on file     Review of Systems: Review of Systems  Constitutional: Negative for chills, diaphoresis, fever, malaise/fatigue and weight loss.  HENT: Negative.  Negative for congestion, ear discharge, ear pain, hearing loss, nosebleeds, sinus pain, sore throat and tinnitus.   Eyes: Negative.  Negative for blurred vision, double vision, photophobia, pain, discharge and redness.  Respiratory: Negative.  Negative for cough, hemoptysis, sputum production, shortness of breath, wheezing and stridor.   Cardiovascular: Negative.  Negative for chest pain, palpitations, orthopnea, claudication, leg swelling and PND.  Gastrointestinal: Positive for abdominal pain, diarrhea, heartburn, nausea and vomiting. Negative for blood in stool, constipation and melena.  Genitourinary: Negative.  Negative for dysuria, flank pain, frequency, hematuria and urgency.  Musculoskeletal: Negative.  Negative for back pain, falls, joint pain, myalgias and neck pain.  Skin: Negative.  Negative for itching and rash.  Neurological: Negative.  Negative for dizziness, tingling, tremors, sensory  change, speech change, focal weakness, seizures, loss of consciousness, weakness and headaches.  Endo/Heme/Allergies: Negative.  Negative for environmental allergies and polydipsia. Does not bruise/bleed easily.  Psychiatric/Behavioral: Negative.  Negative for depression, hallucinations, memory loss, substance abuse and suicidal ideas. The patient is not nervous/anxious and does not have insomnia.     Vital Signs: Blood pressure (!) 147/75, pulse 85, temperature 98.3 F (36.8 C), temperature source Oral, resp. rate 20, height 6' (1.829 m), weight 93.2 kg, SpO2 97 %.  Weight trends: Filed Weights   06/05/18 0736 06/05/18 2012 06/06/18 0017  Weight: 88.9 kg 96 kg 93.2 kg    Physical Exam: General: NAD, sitting up in bed  Head: Normocephalic, atraumatic. Moist oral mucosal membranes  Eyes: Anicteric, PERRL  Neck: Supple, trachea midline  Lungs:  Clear to auscultation  Heart: Regular rate and rhythm  Abdomen:  Soft, nontender, +bowel  sounds  Extremities:  no peripheral edema.  Neurologic: Nonfocal, moving all four extremities  Skin: No lesions  Access: Left AVG     Lab results: Basic Metabolic Panel: Recent Labs  Lab 05/31/18 0547 06/01/18 0123 06/05/18 0800 06/05/18 2042  NA 141 134* 137  --   K 3.9 3.7 4.0  --   CL 97* 91* 97*  --   CO2 27 27 25   --   GLUCOSE 158* 126* 120*  --   BUN 49* 58* 62*  --   CREATININE 7.66* 9.88* 11.42*  --   CALCIUM 8.5* 8.1* 8.7*  --   PHOS  --  8.7*  --  2.9    Liver Function Tests: Recent Labs  Lab 06/01/18 0123 06/05/18 0800  AST  --  24  ALT  --  9  ALKPHOS  --  55  BILITOT  --  1.4*  PROT  --  7.9  ALBUMIN 3.9 4.3   Recent Labs  Lab 06/05/18 0800  LIPASE 41   No results for input(s): AMMONIA in the last 168 hours.  CBC: Recent Labs  Lab 05/31/18 0547 06/01/18 0123 06/05/18 0800  WBC  --  11.8* 9.2  HGB 10.6* 8.8* 10.3*  HCT 34.5* 28.8* 31.1*  MCV  --  92.0 88.6  PLT  --  185 316    Cardiac  Enzymes: Recent Labs  Lab 06/05/18 0819  TROPONINI 0.06*    BNP: Invalid input(s): POCBNP  CBG: Recent Labs  Lab 06/01/18 1114 06/01/18 1632 06/01/18 2127 06/02/18 0800 06/02/18 1159  GLUCAP 134* 111* 121* 51 95    Microbiology: Results for orders placed or performed during the hospital encounter of 09/19/15  Culture, blood (Routine X 2) w Reflex to ID Panel     Status: None   Collection Time: 09/19/15  4:07 AM  Result Value Ref Range Status   Specimen Description BLOOD RIGHT ARM  Final   Special Requests BOTTLES DRAWN AEROBIC AND ANAEROBIC 5ML  Final   Culture NO GROWTH 5 DAYS  Final   Report Status 09/24/2015 FINAL  Final  Culture, blood (Routine X 2) w Reflex to ID Panel     Status: None   Collection Time: 09/19/15  4:07 AM  Result Value Ref Range Status   Specimen Description BLOOD RIGHT HAND  Final   Special Requests BOTTLES DRAWN AEROBIC AND ANAEROBIC 5ML  Final   Culture NO GROWTH 5 DAYS  Final   Report Status 09/24/2015 FINAL  Final  MRSA PCR Screening     Status: None   Collection Time: 09/19/15  7:19 AM  Result Value Ref Range Status   MRSA by PCR NEGATIVE NEGATIVE Final    Comment:        The GeneXpert MRSA Assay (FDA approved for NASAL specimens only), is one component of a comprehensive MRSA colonization surveillance program. It is not intended to diagnose MRSA infection nor to guide or monitor treatment for MRSA infections.   Culture, expectorated sputum-assessment     Status: None   Collection Time: 09/19/15  1:40 PM  Result Value Ref Range Status   Specimen Description SPUTUM  Final   Special Requests NONE  Final   Sputum evaluation   Final    MICROSCOPIC FINDINGS SUGGEST THAT THIS SPECIMEN IS NOT REPRESENTATIVE OF LOWER RESPIRATORY SECRETIONS. PLEASE RECOLLECT. Gram Stain Report Called to,Read Back By and Verified With: R BARBER,RN AT 1751 09/19/15 BY L BENFIELD    Report Status 09/19/2015 FINAL  Final  Culture, expectorated  sputum-assessment     Status: None   Collection Time: 09/19/15 10:20 PM  Result Value Ref Range Status   Specimen Description SPUTUM  Final   Special Requests NONE  Final   Sputum evaluation   Final    THIS SPECIMEN IS ACCEPTABLE. RESPIRATORY CULTURE REPORT TO FOLLOW.   Report Status 09/20/2015 FINAL  Final  Culture, respiratory (NON-Expectorated)     Status: None   Collection Time: 09/19/15 10:20 PM  Result Value Ref Range Status   Specimen Description SPUTUM  Final   Special Requests NONE  Final   Gram Stain   Final    ABUNDANT WBC PRESENT, PREDOMINANTLY PMN RARE SQUAMOUS EPITHELIAL CELLS PRESENT MODERATE GRAM POSITIVE COCCI IN PAIRS IN CLUSTERS FEW GRAM NEGATIVE RODS RARE GRAM POSITIVE RODS THIS SPECIMEN IS ACCEPTABLE FOR SPUTUM CULTURE Performed at [facility]    Culture   Final    NORMAL OROPHARYNGEAL FLORA Performed at [facility]    Report Status 09/22/2015 FINAL  Final    Coagulation Studies: No results for input(s): LABPROT, INR in the last 72 hours.  Urinalysis: Recent Labs    06/05/18 0807  COLORURINE AMBER*  LABSPEC 1.013  PHURINE 5.0  GLUCOSEU NEGATIVE  HGBUR MODERATE*  BILIRUBINUR NEGATIVE  KETONESUR NEGATIVE  PROTEINUR 100*  NITRITE NEGATIVE  LEUKOCYTESUR SMALL*      Imaging: Ct Abdomen Pelvis Wo Contrast  Result Date: 06/05/2018 CLINICAL DATA:  Nausea, vomiting, recent nephrectomy for renal cell carcinoma EXAM: CT ABDOMEN AND PELVIS WITHOUT CONTRAST TECHNIQUE: Multidetector CT imaging of the abdomen and pelvis was performed following the standard protocol without IV contrast. COMPARISON:  CT abdomen and pelvis of 04/04/2018 and abdomen plain films 06/05/2017 FINDINGS: Lower chest: There has been slight increase in bibasilar linear atelectasis. A small nodular opacity posteriorly in the left lower lobe on image 10 series 3 may be due to atelectasis but attention to this area on follow-up chest x-ray is recommended. This nodule was  not present on chest x-ray of 04/04/2018 and therefore most likely is inflammatory or infectious in origin. Again free intraperitoneal air is noted scattered throughout the peritoneal cavity. Hepatobiliary: The liver is unremarkable in the unenhanced state. There is a small amount of perihepatic fluid present. A single gallstone is noted within the gallbladder. The gallbladder is not distended and no gallbladder wall thickening is seen. There may be sludge layering within the gallbladder is well. Pancreas: The pancreas is normal in size and the pancreatic duct is not dilated. Spleen: Spleen is unremarkable. However there is perisplenic fluid present. Adrenals/Urinary Tract: The right adrenal gland is unremarkable. The left adrenal adrenal gland is not well seen and may have been resected at the time of left nephrectomy. There is a line of surgical sutures noted within the left retroperitoneum medially. Stomach/Bowel: The stomach is slightly distended with fluid with no obvious abnormality. No small bowel dilatation or edema is seen. There is a higher attenuation collection adjacent to the junction of the descending colon and proximal sigmoid colon measuring 2.5 x 3.0 cm. This has attenuation of 66 HU and may represent blood. There are also at least 2 other collections of higher attenuation fluid in the left pelvis suspicious for blood as well. If surgery was 1 week ago then blood from that surgical intervention would most likely be lower in attenuation at this point. Etiology of these collections of blood is uncertain and clinical correlation is recommended. At least 2 surgical sutures are  noted within the left pelvis near these collections. The terminal ileum and the appendix are unremarkable. Vascular/Lymphatic: The abdominal aorta is normal caliber with mild abdominal aortic atherosclerosis noted. No adenopathy is seen. Reproductive: The prostate is slightly prominent measuring 4.4 x 5.1 cm. Other: Strandiness is  noted in the left lower quadrant subcutaneous soft tissues most likely due to the recent surgery. Musculoskeletal: There are degenerative changes noted in both hips. The lumbar spine are in normal alignment with normal intervertebral disc spaces. Diffuse anterior osteophyte formation is noted. IMPRESSION: 1. In this patient who reportedly underwent left nephrectomy 1 week ago, there are several small collections of higher attenuation fluid within the left lower quadrant-left pelvis. These collections are most consistent with blood which generally would be low in attenuation 1 week after surgery. Clinical correlation is recommended. 2. Single small calcified gallstone. 3. Scattered bubbles of free air throughout the peritoneal and to a lesser degree retroperitoneal spaces most likely due to recent surgery. 4. Some fluid within the peritoneal cavity most likely postoperative as well. Electronically Signed   By: Ivar Drape M.D.   On: 06/05/2018 13:37   Dg Abd Acute W/chest  Result Date: 06/05/2018 CLINICAL DATA:  Nausea, left nephrectomy a week ago EXAM: DG ABDOMEN ACUTE W/ 1V CHEST COMPARISON:  CT abdomen pelvis of 04/04/2018 and chest x-ray of 12/28/2017 FINDINGS: No active infiltrate or effusion is seen. The does appear to be lucency on the right right hemidiaphragm consistent with free air most likely due to recent surgery. Mediastinal and hilar contours are unremarkable and mild cardiomegaly is stable. There are degenerative changes in the thoracic spine and in both shoulders. Supine and erect views the abdomen show no definite evidence of bowel obstruction. There is bowel gas within primarily small bowel without distension, its may be due to mild ileus pattern. Some bowel gas is present within the colon. There is free intraperitoneal air most likely due to recent surgical intervention. Clinical correlation is recommended. There are degenerative changes in the lower lumbar spine and in both hips. No opaque  calculi are seen. IMPRESSION: 1. Free intraperitoneal air. This may well be related to recent surgery, but correlate clinically. 2. Probable ileus.  No definite bowel obstruction. 3. Degenerative change in both hips and in the lower lumbar spine. 4. Suboptimal inspiration but no active lung disease. Electronically Signed   By: Ivar Drape M.D.   On: 06/05/2018 09:24      Assessment & Plan: Mr. QUINTELL BONNIN is a 62 y.o. black male with end stage renal disease on hemodialysis, obstructive sleep apnea, coronary artery disease, hypertension, hyperlipidemia, diabetes mellitus type II, CVA, congestive heart failure , who was admitted to [facility] on 06/05/2018 for Intractable nausea and vomiting [R11.2]  [facility] [place] left AVG  1. End Stage Renal Disease: TTS. Hemodialysis treatment yesterday. Tolerated treatment well.  - Continue TTS schedule  2. Hypertension: blood pressure at goal.  - carvedilol, diltiazem, furosemide, hydralazine.   3. Anemia of chronic kidney disease: hemoglobin 10.3 - ESA as outpatient.   4. Secondary Hyperparathyroidism:  - 3 calcium acetate with meals.   LOS: 1 Ellissa Ayo 10/23/20194:29 PM

## 2018-06-06 NOTE — Progress Notes (Signed)
[name] NAME: Charles Hart    MR#:  [id number]  DATE OF BIRTH:  01/24/1956  SUBJECTIVE:   Patient is status post recent nephrectomy and presented to the hospital due to abdominal pain nausea and vomiting and noted to have imaging findings suggestive of an ileus.  Patient says his abdominal pain has improved he denies any nausea vomiting this morning.  REVIEW OF SYSTEMS:    Review of Systems  Constitutional: Negative for chills and fever.  HENT: Negative for congestion and tinnitus.   Eyes: Negative for blurred vision and double vision.  Respiratory: Negative for cough, shortness of breath and wheezing.   Cardiovascular: Negative for chest pain, orthopnea and PND.  Gastrointestinal: Positive for abdominal pain (near surgical site). Negative for diarrhea, nausea and vomiting.  Genitourinary: Negative for dysuria and hematuria.  Neurological: Negative for dizziness, sensory change and focal weakness.  All other systems reviewed and are negative.   Nutrition: Clear liquids Tolerating Diet: Yes Tolerating PT: Await Eval  DRUG ALLERGIES:   Allergies  Allergen Reactions  . Ivp Dye [Iodinated Diagnostic Agents] Other (See Comments)    Per patient's Nephrologist, he doesn't want the patient exposed to ANY dye because of issues with his kidneys  . Hydrocodone     Caused hands and feet to peel   . Amlodipine Swelling  . Lisinopril Cough  . Red Dye Other (See Comments)    NO dye of any kind (issues with his kidneys)    VITALS:  Blood pressure (!) 147/75, pulse 85, temperature 98.3 F (36.8 C), temperature source Oral, resp. rate 20, height 6' (1.829 m), weight 93.2 kg, SpO2 97 %.  PHYSICAL EXAMINATION:   Physical Exam  GENERAL:  62 y.o.-year-old patient lying in bed in no acute distress.  EYES: Pupils equal, round, reactive to light and accommodation. No scleral icterus. Extraocular muscles intact.  HEENT: Head  atraumatic, normocephalic. Oropharynx and nasopharynx clear.  NECK:  Supple, no jugular venous distention. No thyroid enlargement, no tenderness.  LUNGS: Normal breath sounds bilaterally, no wheezing, rales, rhonchi. No use of accessory muscles of respiration.  CARDIOVASCULAR: S1, S2 normal. No murmurs, rubs, or gallops.  ABDOMEN: Soft, Tender near surgical site. Mid-abdomen scar, nondistended. Bowel sounds present. No organomegaly or mass.  EXTREMITIES: No cyanosis, clubbing or edema b/l.    NEUROLOGIC: Cranial nerves II through XII are intact. No focal Motor or sensory deficits b/l.   PSYCHIATRIC: The patient is alert and oriented x 3.  SKIN: No obvious rash, lesion, or ulcer.    LABORATORY PANEL:   CBC Recent Labs  Lab 06/05/18 0800  WBC 9.2  HGB 10.3*  HCT 31.1*  PLT 316   ------------------------------------------------------------------------------------------------------------------  Chemistries  Recent Labs  Lab 06/05/18 0800  NA 137  K 4.0  CL 97*  CO2 25  GLUCOSE 120*  BUN 62*  CREATININE 11.42*  CALCIUM 8.7*  AST 24  ALT 9  ALKPHOS 55  BILITOT 1.4*   ------------------------------------------------------------------------------------------------------------------  Cardiac Enzymes Recent Labs  Lab 06/05/18 0819  TROPONINI 0.06*   ------------------------------------------------------------------------------------------------------------------  RADIOLOGY:  Ct Abdomen Pelvis Wo Contrast  Result Date: 06/05/2018 CLINICAL DATA:  Nausea, vomiting, recent nephrectomy for renal cell carcinoma EXAM: CT ABDOMEN AND PELVIS WITHOUT CONTRAST TECHNIQUE: Multidetector CT imaging of the abdomen and pelvis was performed following the standard protocol without IV contrast. COMPARISON:  CT abdomen and pelvis of 04/04/2018 and abdomen plain films 06/05/2017 FINDINGS: Lower chest: There  has been slight increase in bibasilar linear atelectasis. A small nodular opacity  posteriorly in the left lower lobe on image 10 series 3 may be due to atelectasis but attention to this area on follow-up chest x-ray is recommended. This nodule was not present on chest x-ray of 04/04/2018 and therefore most likely is inflammatory or infectious in origin. Again free intraperitoneal air is noted scattered throughout the peritoneal cavity. Hepatobiliary: The liver is unremarkable in the unenhanced state. There is a small amount of perihepatic fluid present. A single gallstone is noted within the gallbladder. The gallbladder is not distended and no gallbladder wall thickening is seen. There may be sludge layering within the gallbladder is well. Pancreas: The pancreas is normal in size and the pancreatic duct is not dilated. Spleen: Spleen is unremarkable. However there is perisplenic fluid present. Adrenals/Urinary Tract: The right adrenal gland is unremarkable. The left adrenal adrenal gland is not well seen and may have been resected at the time of left nephrectomy. There is a line of surgical sutures noted within the left retroperitoneum medially. Stomach/Bowel: The stomach is slightly distended with fluid with no obvious abnormality. No small bowel dilatation or edema is seen. There is a higher attenuation collection adjacent to the junction of the descending colon and proximal sigmoid colon measuring 2.5 x 3.0 cm. This has attenuation of 66 HU and may represent blood. There are also at least 2 other collections of higher attenuation fluid in the left pelvis suspicious for blood as well. If surgery was 1 week ago then blood from that surgical intervention would most likely be lower in attenuation at this point. Etiology of these collections of blood is uncertain and clinical correlation is recommended. At least 2 surgical sutures are noted within the left pelvis near these collections. The terminal ileum and the appendix are unremarkable. Vascular/Lymphatic: The abdominal aorta is normal caliber  with mild abdominal aortic atherosclerosis noted. No adenopathy is seen. Reproductive: The prostate is slightly prominent measuring 4.4 x 5.1 cm. Other: Strandiness is noted in the left lower quadrant subcutaneous soft tissues most likely due to the recent surgery. Musculoskeletal: There are degenerative changes noted in both hips. The lumbar spine are in normal alignment with normal intervertebral disc spaces. Diffuse anterior osteophyte formation is noted. IMPRESSION: 1. In this patient who reportedly underwent left nephrectomy 1 week ago, there are several small collections of higher attenuation fluid within the left lower quadrant-left pelvis. These collections are most consistent with blood which generally would be low in attenuation 1 week after surgery. Clinical correlation is recommended. 2. Single small calcified gallstone. 3. Scattered bubbles of free air throughout the peritoneal and to a lesser degree retroperitoneal spaces most likely due to recent surgery. 4. Some fluid within the peritoneal cavity most likely postoperative as well. Electronically Signed   By: Ivar Drape M.D.   On: 06/05/2018 13:37   Dg Abd Acute W/chest  Result Date: 06/05/2018 CLINICAL DATA:  Nausea, left nephrectomy a week ago EXAM: DG ABDOMEN ACUTE W/ 1V CHEST COMPARISON:  CT abdomen pelvis of 04/04/2018 and chest x-ray of 12/28/2017 FINDINGS: No active infiltrate or effusion is seen. The does appear to be lucency on the right right hemidiaphragm consistent with free air most likely due to recent surgery. Mediastinal and hilar contours are unremarkable and mild cardiomegaly is stable. There are degenerative changes in the thoracic spine and in both shoulders. Supine and erect views the abdomen show no definite evidence of bowel obstruction. There is bowel gas  within primarily small bowel without distension, its may be due to mild ileus pattern. Some bowel gas is present within the colon. There is free intraperitoneal air most  likely due to recent surgical intervention. Clinical correlation is recommended. There are degenerative changes in the lower lumbar spine and in both hips. No opaque calculi are seen. IMPRESSION: 1. Free intraperitoneal air. This may well be related to recent surgery, but correlate clinically. 2. Probable ileus.  No definite bowel obstruction. 3. Degenerative change in both hips and in the lower lumbar spine. 4. Suboptimal inspiration but no active lung disease. Electronically Signed   By: Ivar Drape M.D.   On: 06/05/2018 09:24     ASSESSMENT AND PLAN:   62 year old male with past medical history of end-stage renal disease on hemodialysis, diabetes, hypertension, obstructive sleep apnea, status post recent left nephrectomy who presented to the hospital due to abdominal pain, nausea vomiting.  1.  Abdominal pain/nausea/vomiting-suspected to be secondary to mild ileus postoperatively.  Patient is status post recent left-sided nephrectomy.  Patient CT scan of the abdomen pelvis showed some fluid collections which as per surgery is mostly postoperative/postsurgical imaging findings.  No acute evidence of bowel obstruction or anything that requires surgical intervention. - Patient has no further nausea vomiting this morning.  We will start him on clear liquids and advance as tolerated. -Continue supportive care.  2.  End-stage renal disease on hemodialysis-patient is on a Tuesday Thursday Saturday schedule.  Patient had his dialysis yesterday.  Nephrology has been consulted, continue care as per them.  3.  Essential hypertension-continue hydralazine, Cardizem, carvedilol.  4.  Hyperlipidemia-continue Pravachol.  5.  Neuropathy-continue gabapentin.  6.  Secondary hyperparathyroidism-continue PhosLo    All the records are reviewed and case discussed with Care Management/Social Worker. Management plans discussed with the patient, family and they are in agreement.  CODE STATUS: Full code  DVT  Prophylaxis: Hep SQ  TOTAL TIME TAKING CARE OF THIS PATIENT: 30 minutes.   POSSIBLE D/C IN 1-2 DAYS, DEPENDING ON CLINICAL CONDITION.   Henreitta Leber M.D on 06/06/2018 at 1:55 PM  Between 7am to 6pm - Pager - [phone]  After 6pm go to [email or web address] - [employer] [facility]  Office  [phone]  CC: Primary care physician; Lucious Groves, DO

## 2018-06-06 NOTE — Consult Note (Addendum)
SURGICAL CONSULTATION NOTE (initial) - cpt: [phone]  Patient seen and examined as described below with surgical PA-C, Ardell Isaacs.  Assessment/Plan: (ICD-10's: K66.8) In summary, patient is a 62 y.o. 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   - CT reviewed and discussed with patient, ED physician, and admitting hospitalist   - while we typically expect resolution of post-surgical pneumoperitoneum over 72 hours, it remains the most likely explanation in this patient considering no abdominal pain or distention, normal WBC, and no evidence of GI pathology (colonic or small bowel thickening) on CT  - monitor abdominal exam and bowel function while inpatient, minimize narcotics  - no indication for surgical management, will sign off  - please call if any questions or concerns  - DVT prophylaxis  I have personally reviewed the patient's chart, evaluated/examined the patient, proposed the recommended management, and discussed these recommendations with the patient to his expressed satisfaction as well as with patient's RN and medical + ED physician.  Thank you for the opportunity to participate in this patient's care.  -- Marilynne Drivers Rosana Hoes, MD, [place]: [facility] - Partnering for exceptional care. Office: [phone]    SURGICAL CONSULTATION NOTE (initial) - cpt: [phone]  HISTORY OF PRESENT ILLNESS (HPI):  62 y.o. male presented [facility] ED on 10/22 for evaluation of nausea and emesis. Patient reports that he underwent robot assisted nephrectomy on 10/16 secondary to kidney cancer.Since that time he reports about 5 days of nausea and watery emesis as well as no bowel movement  since the surgery. He does endorse passing flatus yesterday. He denied any abdominal pain, fevers,chills, or peripheral edema. He does have a history of ESRD on HD TThS and missed his Tuesday appointment. Work up in the ED was concerning for residual small amounts of free air in the abdomen in the setting of nausea and emesis.   Surgery is consulted by hospitalist physician physician Dr. Loney Hering, MD in this context for evaluation and management of pneumoperitoneum on CT s/p robotic-assisted nephrectomy.  PAST MEDICAL HISTORY (PMH):  Past Medical History:  Diagnosis Date  . Adenomatous colon polyp 07/02/2011   Last colonoscopy May 06, 2011 by Dr. Owens Loffler, who recommended repeat colonoscopy in 5 years.   . Anemia   . Background diabetic retinopathy 04/20/2012   Patient is followed by Dr. Katy Fitch   . Cardiomyopathy    LV function improved from 2004 to 2008.  Historically, moderately dilated LV with EF 30-40% by 2D echo 08/14/2002.  Mild CAD with severe LV dysfunction by cardiac cath 09/2002.  Normal coronary arteries and normal LV function by cardiac cath 09/19/2006.  A 2-D echo on 04/01/2009 showed mild concentric hypertrophy and normal systolic (LVEF  26-83%) and doppler C/W with grade 1 diastolic dysfunction.  . CHF (congestive heart failure) ([place])    LV function improved from 2004 to 2008.  Historically, moderately dilated LV with EF 30-40% by 2D echo 08/14/2002.  Mild CAD with severe LV dysfunction by cardiac cath 09/2002.  Normal coronary arteries and normal LV function by cardiac cath 09/19/2006.  A 2-D echo on 04/01/2009 showed mild concentric hypertrophy and normal systolic (LVEF  41-96%) and doppler C/W with grade 1 diastolic dysfunction..   . Chronic combined systolic and diastolic  congestive heart failure ([place]) 05/21/2010   LV function improved from 2004 to 2008.  Historically, moderately dilated LV with EF 30-40% by 2D echo 08/14/2002.  Mild CAD with severe LV dysfunction by  cardiac cath 09/2002.  Normal coronary arteries and normal LV function by cardiac cath 09/19/2006.  A 2-D echo on 04/01/2009 showed mild concentric hypertrophy and normal systolic (LVEF  22-97%) and doppler parameters consistent with abnormal left   . CVA (cerebral vascular accident) ([place]) 07/04/2012   MRI of the brain 07/04/2012 showed an acute infarct in the right basal ganglia involving the anterior putamen, anterior limb internal capsule, and head of the caudate; this measured approximately 2.5 cm in diameter.     . Dermatitis   . Diabetes mellitus    type 2  . DIABETIC PERIPHERAL NEUROPATHY 08/03/2007   Qualifier: Diagnosis of  By: Marinda Elk MD, Sonia Side    . DM neuropathy, painful (HCC)    fingers and right knee  . ESRD (end stage renal disease) ([place])    Stage 4, on hemodailysis x 4 months as of 05-18-18 [facility], tues thurs sat  . ESRD (end stage renal disease) on dialysis ([place])    "TTS; [facility]" (05/31/2018)  . Hearing loss in right ear   . Hyperlipidemia   . Hypertension   . Hypertensive crisis 07/28/2012  . Hypertensive urgency 08/20/2014  . Myocardial infarction [facility])  many yrs ago  . Nephrotic syndrome 02/18/2013   A 24-hour urine collection 03/04/2013 showed total protein of 5,460 g and creatinine clearance of 80 mL/minute.  Patient was seen by Seward Meth at [facility] and a repeat 24-hour urine showed 10,407 mg protein.  Patient underwent kidney biopsy on 05/30/2013; pathology showed advanced diffuse and nodular diabetic nephropathy with vascular changes consistent with long-standing difficult to control hypertension.     . Pneumonia 2014 and 2015  . Renal insufficiency   . Sleep apnea    uses cpap "sometimes per pt"    PAST SURGICAL HISTORY [facility]):  Past Surgical History:  Procedure Laterality Date  . AV FISTULA PLACEMENT Left 11/24/2017   Procedure: INSERTION OF ARTERIOVENOUS (AV) GORE-TEX GRAFT LEFT LOWER ARM;  Surgeon: Rosetta Posner, MD;   Location: [facility];  Service: Vascular;  Laterality: Left;  . CARDIAC CATHETERIZATION      4 times  . COLONOSCOPY    . FOOT SURGERY    . POLYPECTOMY    . ROBOT ASSISTED LAPAROSCOPIC NEPHRECTOMY Left 05/30/2018   Procedure: XI ROBOTIC ASSISTED LAPAROSCOPIC NEPHRECTOMY;  Surgeon: Alexis Frock, MD;  Location: [facility];  Service: Urology;  Laterality: Left;    MEDICATIONS:  Prior to Admission medications   Medication Sig Start Date End Date Taking? Authorizing Provider  calcium acetate (PHOSLO) 667 MG capsule Take 1,334 mg by mouth 3 (three) times daily with meals.  12/15/17  Yes Ludlow, Caren Griffins, MD  CARTIA XT 180 MG 24 hr capsule TAKE 1 CAPSULE(180 MG) BY MOUTH DAILY Patient taking differently: Take 180 mg by mouth daily. TAKE 1 CAPSULE(180 MG) BY MOUTH DAILY 09/07/17  Yes Lucious Groves, DO  carvedilol (COREG) 25 MG tablet Take 25 mg by mouth 2 (two) times daily with a meal.  09/06/17  Yes [provider]  clopidogrel (PLAVIX) 75 MG tablet Take 75 mg by mouth daily.   Yes [provider]  fluticasone (FLONASE) 50 MCG/ACT nasal spray Place 2 sprays daily into both nostrils. Patient taking differently: Place 2 sprays into both nostrils as needed for  allergies.  07/01/17  Yes Noemi Chapel, MD  folic acid-vitamin b complex-vitamin c-selenium-zinc (DIALYVITE) 3 MG TABS tablet Take 1 tablet by mouth daily.   Yes [provider]  furosemide (LASIX) 80 MG tablet Take 80 mg by mouth 2 (two) times daily.   Yes [provider]  gabapentin (NEURONTIN) 300 MG capsule Take 300 mg by mouth at bedtime.   Yes [provider]  hydrALAZINE (APRESOLINE) 100 MG tablet Take 1 tablet (100 mg total) by mouth 3 (three) times daily. 09/07/17 09/16/18 Yes Lucious Groves, DO  oxyCODONE (OXY IR/ROXICODONE) 5 MG immediate release tablet Take 1 tablet (5 mg total) by mouth every 4 (four) hours as needed for moderate pain. 06/02/18  Yes McKenzie, Candee Furbish, MD  pravastatin (PRAVACHOL)  40 MG tablet Take 1 tablet (40 mg total) by mouth daily. 09/07/17  Yes Lucious Groves, DO  promethazine (PHENERGAN) 25 MG tablet Take 1 tablet (25 mg total) by mouth every 6 (six) hours as needed for nausea or vomiting. 12/07/17  Yes Lucious Groves, DO    ALLERGIES:  Allergies  Allergen Reactions  . Ivp Dye [Iodinated Diagnostic Agents] Other (See Comments)    Per patient's Nephrologist, he doesn't want the patient exposed to ANY dye because of issues with his kidneys  . Hydrocodone     Caused hands and feet to peel   . Amlodipine Swelling  . Lisinopril Cough  . Red Dye Other (See Comments)    NO dye of any kind (issues with his kidneys)    SOCIAL HISTORY:  Social History   Socioeconomic History  . Marital status: Divorced    Spouse name: Not on file  . Number of children: Not on file  . Years of education: Not on file  . Highest education level: Not on file  Occupational History  . Not on file  Social Needs  . Financial resource strain: Not on file  . Food insecurity:    Worry: Not on file    Inability: Not on file  . Transportation needs:    Medical: Not on file    Non-medical: Not on file  Tobacco Use  . Smoking status: Never Smoker  . Smokeless tobacco: Never Used  Substance and Sexual Activity  . Alcohol use: No    Alcohol/week: 0.0 standard drinks  . Drug use: No  . Sexual activity: Not on file  Lifestyle  . Physical activity:    Days per week: Not on file    Minutes per session: Not on file  . Stress: Not on file  Relationships  . Social connections:    Talks on phone: Not on file    Gets together: Not on file    Attends religious service: Not on file    Active member of club or organization: Not on file    Attends meetings of clubs or organizations: Not on file    Relationship status: Not on file  . Intimate partner violence:    Fear of current or ex partner: Not on file    Emotionally abused: Not on file    Physically abused: Not on file    Forced  sexual activity: Not on file  Other Topics Concern  . Not on file  Social History Narrative  . Not on file    The patient currently resides (home / rehab facility / nursing home): Home The patient normally is (ambulatory / bedbound): Ambulatory   FAMILY HISTORY:  Family History  Problem Relation  Age of Onset  . Aneurysm Father 87       died of rupture  . Colon cancer Sister     REVIEW OF SYSTEMS:  Constitutional: denies weight loss, fever, chills, or sweats  Eyes: denies any other vision changes, history of eye injury  ENT: denies sore throat, hearing problems  Respiratory: denies shortness of breath, wheezing  Cardiovascular: denies chest pain, palpitations  Gastrointestinal: denies abdominal pain, + N/V, denied diarrhea/and bowel function as per HPI Genitourinary: denies burning with urination or urinary frequency Musculoskeletal: denies any other joint pains or cramps  Skin: denies any other rashes or skin discolorations  Neurological: denies any other headache, dizziness, weakness  Psychiatric: denies any other depression, anxiety   All other review of systems were negative   VITAL SIGNS:  Temp:  [98.3 F (36.8 C)-99.4 F (37.4 C)] 99.1 F (37.3 C) (10/23 0443) Pulse Rate:  [77-98] 90 (10/23 0443) Resp:  [15-34] 16 (10/23 0443) BP: (89-206)/(48-145) 161/81 (10/23 0443) SpO2:  [93 %-100 %] 100 % (10/23 0443) Weight:  [93.2 kg-96 kg] 93.2 kg (10/23 0017)     Height: 6' (182.9 cm) Weight: 93.2 kg BMI (Calculated): 27.86   INTAKE/OUTPUT:  This shift: No intake/output data recorded.  Last 2 shifts: @IOLAST2SHIFTS @   PHYSICAL EXAM:  Constitutional:  -- Overweight-appearing body habitus  -- Awake, alert, and oriented x3, no apparent distress Eyes:  -- Pupils equally round and reactive to light  -- No scleral icterus, B/L no occular discharge Ear, nose, throat: -- Neck is FROM WNL Pulmonary:  -- No wheezes or rhales -- Equal breath sounds bilaterally -- Breathing  non-labored at rest Cardiovascular:  -- S1, S2 present  -- No pericardial rubs  Gastrointestinal:  -- Abdomen soft, nontender, non-distended, no guarding or rebound tenderness -- No abdominal masses appreciated, pulsatile or otherwise  -- Laparoscopic incisions are CDI, no erythema, no drainage Musculoskeletal and Integumentary:  -- Wounds or skin discoloration: Laparoscopic Incisions as above -- Extremities: B/L UE and LE FROM, hands and feet warm, no edema  Neurologic:  -- Motor function: Intact and symmetric -- Sensation: Intact and symmetric Psychiatric:  -- Mood and affect WNL  Labs:  CBC Latest Ref Rng & Units 06/05/2018 06/01/2018 05/31/2018  WBC 4.0 - 10.5 K/uL 9.2 11.8(H) -  Hemoglobin 13.0 - 17.0 g/dL 10.3(L) 8.8(L) 10.6(L)  Hematocrit 39.0 - 52.0 % 31.1(L) 28.8(L) 34.5(L)  Platelets 150 - 400 K/uL 316 185 -   CMP Latest Ref Rng & Units 06/05/2018 06/01/2018 05/31/2018  Glucose 70 - 99 mg/dL 120(H) 126(H) 158(H)  BUN 8 - 23 mg/dL 62(H) 58(H) 49(H)  Creatinine 0.61 - 1.24 mg/dL 11.42(H) 9.88(H) 7.66(H)  Sodium 135 - 145 mmol/L 137 134(L) 141  Potassium 3.5 - 5.1 mmol/L 4.0 3.7 3.9  Chloride 98 - 111 mmol/L 97(L) 91(L) 97(L)  CO2 22 - 32 mmol/L 25 27 27   Calcium 8.9 - 10.3 mg/dL 8.7(L) 8.1(L) 8.5(L)  Total Protein 6.5 - 8.1 g/dL 7.9 - -  Total Bilirubin 0.3 - 1.2 mg/dL 1.4(H) - -  Alkaline Phos 38 - 126 U/L 55 - -  AST 15 - 41 U/L 24 - -  ALT 0 - 44 U/L 9 - -   Imaging studies:  CT Abdomen/Pelvis on 10/22:  1. In this patient who reportedly underwent left nephrectomy 1 week ago, there are several small collections of higher attenuation fluid within the left lower quadrant-left pelvis. These collections are most consistent with blood which generally would be low  in attenuation 1 week after surgery. Clinical correlation is recommended. 2. Single small calcified gallstone. 3. Scattered bubbles of free air throughout the peritoneal and to a lesser degree retroperitoneal  spaces most likely due to recent surgery. 4. Some fluid within the peritoneal cavity most likely postoperative as well.  Assessment/Plan: (ICD-10's: R11.0) 62 y.o. male with improved post-operative nausea and emesis with residual free air in the abdomen visualized on CT scan 18/29, complicated by pertinent comorbidities including ESRD on HD T/Th/S, anemia, Cardiomyopathy, CHF, History of CVA, History of MIDM, HTN, HLD, sleep apnea, and obesity.    - Discussed imaging results with Dr.Dylynn Ketner. With no WBC, VSS, and no abdominal pain there is no obvious pathology for the free air appreciated on CT. This is most likely attributed to his recent procedure even though the air is typically expected to be resorbed in 72 hours.   - Start on clears this morning  - monitor ongoing bowel function and serial abdominal exam  - medical management and discharge planning as per primary medical team  - no surgical intervention indicated at this time, will signoff  - please call if any questions or concerns develop  All of the above findings and recommendations were discussed with the patient and all of patient's questions were answered to his expressed satisfaction.  Thank you for the opportunity to participate in this patient's care.   -- Edison Simon, PA-C [facility] 06/06/2018, 7:45 AM [phone] M-F: 7am - 4pm

## 2018-06-06 NOTE — Progress Notes (Signed)
Post HD assessment. Pt tolerated tx well without c/o. Pt experienced a brief non-symptomatic drop in BP, UF off during this period, MD aware. Net UF 2239.     06/06/18 0017  Vital Signs  Temp 98.3 F (36.8 C)  Temp Source Oral  Pulse Rate 88  Pulse Rate Source Monitor  Resp 20  BP (!) 141/61  BP Location Right Arm  BP Method Automatic  Patient Position (if appropriate) Lying  Oxygen Therapy  SpO2 98 %  O2 Device Room Air  Dialysis Weight  Weight 93.2 kg  Type of Weight Post-Dialysis  Post-Hemodialysis Assessment  Rinseback Volume (mL) 250 mL  KECN 80.6 V  Dialyzer Clearance Lightly streaked  Duration of HD Treatment -hour(s) 3.5 hour(s)  Hemodialysis Intake (mL) 500 mL  UF Total -Machine (mL) 2739 mL  Net UF (mL) 2239 mL  Tolerated HD Treatment Yes  AVG/AVF Arterial Site Held (minutes) 10 minutes  AVG/AVF Venous Site Held (minutes) 10 minutes  Education / Care Plan  Dialysis Education Provided Yes  Documented Education in Care Plan Yes  Fistula / Graft Left Forearm Arteriovenous vein graft  Placement Date/Time: 11/24/17 1041   Placed prior to admission: No  Orientation: Left  Access Location: Forearm  Access Type: (c) Arteriovenous vein graft  Site Condition No complications  Fistula / Graft Assessment Present;Thrill;Bruit  Status Deaccessed  Drainage Description None

## 2018-06-06 NOTE — Progress Notes (Signed)
Post HD assessment    06/06/18 0016  Neurological  Level of Consciousness Alert  Orientation Level Oriented X4  Respiratory  Respiratory Pattern Regular;Unlabored  Chest Assessment Chest expansion symmetrical  Cardiac  ECG Monitor Yes  Cardiac Rhythm NSR  Ectopy Multifocal PVC's  Ectopy Frequency Frequent  Vascular  R Radial Pulse +2  L Radial Pulse +2  Edema Generalized;Right lower extremity;Left lower extremity  Integumentary  Integumentary (WDL) X  Skin Color Appropriate for ethnicity  Musculoskeletal  Musculoskeletal (WDL) X  Generalized Weakness Yes  Assistive Device None  GU Assessment  Genitourinary (WDL) X  Psychosocial  Psychosocial (WDL) WDL  Patient Behaviors Cooperative;Calm  Needs Expressed Physical  Emotional support given Given to patient

## 2018-06-07 LAB — RENAL FUNCTION PANEL
ANION GAP: 15 (ref 5–15)
Albumin: 3.7 g/dL (ref 3.5–5.0)
BUN: 54 mg/dL — ABNORMAL HIGH (ref 8–23)
CALCIUM: 8 mg/dL — AB (ref 8.9–10.3)
CO2: 28 mmol/L (ref 22–32)
Chloride: 93 mmol/L — ABNORMAL LOW (ref 98–111)
Creatinine, Ser: 9.82 mg/dL — ABNORMAL HIGH (ref 0.61–1.24)
GFR calc non Af Amer: 5 mL/min — ABNORMAL LOW (ref >60)
GFR, EST AFRICAN AMERICAN: 6 mL/min — AB (ref >60)
Glucose, Bld: 85 mg/dL (ref 70–99)
Phosphorus: 4 mg/dL (ref 2.5–4.6)
Potassium: 3.4 mmol/L — ABNORMAL LOW (ref 3.5–5.1)
SODIUM: 136 mmol/L (ref 135–145)

## 2018-06-07 LAB — PARATHYROID HORMONE, INTACT (NO CA): PTH: 101 pg/mL — AB (ref 15–65)

## 2018-06-07 LAB — CBC
HCT: 26.6 % — ABNORMAL LOW (ref 39.0–52.0)
HEMOGLOBIN: 8.7 g/dL — AB (ref 13.0–17.0)
MCH: 29.3 pg (ref 26.0–34.0)
MCHC: 32.7 g/dL (ref 30.0–36.0)
MCV: 89.6 fL (ref 80.0–100.0)
Platelets: 307 10*3/uL (ref 150–400)
RBC: 2.97 MIL/uL — AB (ref 4.22–5.81)
RDW: 12.9 % (ref 11.5–15.5)
WBC: 7.3 10*3/uL (ref 4.0–10.5)
nRBC: 0 % (ref 0.0–0.2)

## 2018-06-07 LAB — HIV ANTIBODY (ROUTINE TESTING W REFLEX): HIV SCREEN 4TH GENERATION: NONREACTIVE

## 2018-06-07 MED ORDER — SODIUM CHLORIDE 0.9 % IV SOLN: 100.0000 mL | INTRAVENOUS | Status: DC | PRN

## 2018-06-07 MED ORDER — LIDOCAINE HCL (PF) 1 % IJ SOLN
5.0000 mL | INTRAMUSCULAR | Status: DC | PRN
  Filled 2018-06-07: qty 5

## 2018-06-07 MED ORDER — FLUTICASONE PROPIONATE 50 MCG/ACT NA SUSP: 2.0000 | NASAL | Status: DC | PRN

## 2018-06-07 MED ORDER — LIDOCAINE-PRILOCAINE 2.5-2.5 % EX CREA
1.0000 "application " | TOPICAL_CREAM | CUTANEOUS | Status: DC | PRN
  Filled 2018-06-07: qty 5

## 2018-06-07 MED ORDER — ALTEPLASE 2 MG IJ SOLR: 2.0000 mg | Freq: Once | INTRAMUSCULAR | Status: DC | PRN

## 2018-06-07 MED ORDER — PENTAFLUOROPROP-TETRAFLUOROETH EX AERO
1.0000 "application " | INHALATION_SPRAY | CUTANEOUS | Status: DC | PRN
  Filled 2018-06-07: qty 30

## 2018-06-07 MED ORDER — CARTIA XT 180 MG PO CP24: 180.0000 mg | ORAL_CAPSULE | Freq: Every day | ORAL | Status: DC

## 2018-06-07 MED ORDER — HEPARIN SODIUM (PORCINE) 1000 UNIT/ML DIALYSIS
1000.0000 [IU] | INTRAMUSCULAR | Status: DC | PRN
  Filled 2018-06-07: qty 1

## 2018-06-07 NOTE — Progress Notes (Signed)
Post HD assessment    06/07/18 1244  Neurological  Level of Consciousness Alert  Orientation Level Oriented X4  Respiratory  Respiratory Pattern Regular;Unlabored  Chest Assessment Chest expansion symmetrical  Cardiac  Pulse Regular  ECG Monitor Yes  Cardiac Rhythm NSR  Ectopy Multifocal PVC's  Ectopy Frequency Occasional  Vascular  R Radial Pulse +2  L Radial Pulse +2  Edema Generalized;Right lower extremity;Left lower extremity  Integumentary  Integumentary (WDL) X  Skin Color Appropriate for ethnicity  Musculoskeletal  Musculoskeletal (WDL) X  Generalized Weakness Yes  Assistive Device None  GU Assessment  Genitourinary (WDL) X  Genitourinary Symptoms  (HD)  Psychosocial  Psychosocial (WDL) WDL

## 2018-06-07 NOTE — Progress Notes (Signed)
Pre HD assessment    06/07/18 0922  Vital Signs  Temp 98.9 F (37.2 C)  Temp Source Oral  Pulse Rate 75  Pulse Rate Source Monitor  Resp 19  BP (!) 144/73  BP Location Right Arm  BP Method Automatic  Patient Position (if appropriate) Lying  Oxygen Therapy  SpO2 100 %  O2 Device Room Air  Pain Assessment  Pain Scale 0-10  Pain Score 0  Dialysis Weight  Weight 92.9 kg  Type of Weight Pre-Dialysis  Time-Out for Hemodialysis  What Procedure? HD  Pt Identifiers(min of two) First/Last Name;MRN/Account#  Correct Site? Yes  Correct Side? Yes  Correct Procedure? Yes  Consents Verified? Yes  Rad Studies Available? N/A  Safety Precautions Reviewed? Yes  Engineer, civil (consulting) Number  ([id number])  Station Number 1  UF/Alarm Test Passed  Conductivity: Meter 13.6  Conductivity: Machine  13.7  pH 7.6  Reverse Osmosis main  Normal Saline Lot Number [id number]  Dialyzer Lot Number [id number]  Disposable Set Lot Number [id number]  Machine Temperature 98.6 F (37 C)  Musician and Audible Yes  Blood Lines Intact and Secured Yes  Pre Treatment Patient Checks  Vascular access used during treatment Graft  Hepatitis B Surface Antigen Results Negative  Date Hepatitis B Surface Antigen Drawn 06/05/18  Hepatitis B Surface Antibody  (<10)  Date Hepatitis B Surface Antibody Drawn 06/05/18  Hemodialysis Consent Verified Yes  Hemodialysis Standing Orders Initiated Yes  ECG (Telemetry) Monitor On Yes  Prime Ordered Normal Saline  Length of  DialysisTreatment -hour(s) 3 Hour(s)  Dialyzer Elisio 17H NR  Dialysate 3K, 2.5 Ca  Dialysis Anticoagulant None  Dialysate Flow Ordered 600  Blood Flow Rate Ordered 400 mL/min  Ultrafiltration Goal 1 Liters  Pre Treatment Labs Renal panel;CBC  Dialysis Blood Pressure Support Ordered Normal Saline  Education / Care Plan  Dialysis Education Provided Yes  Documented Education in Care Plan Yes  Fistula / Graft Left Forearm Arteriovenous vein graft   Placement Date/Time: 11/24/17 1041   Placed prior to admission: No  Orientation: Left  Access Location: Forearm  Access Type: (c) Arteriovenous vein graft  Site Condition No complications  Fistula / Graft Assessment Present;Thrill;Bruit  Drainage Description None

## 2018-06-07 NOTE — Progress Notes (Signed)
[facility]  ROUNDING NOTE   Subjective:  Patient completed hemodialysis today. His outpatient dialysis in [place] and has been set up temporarily. He normally dialyzes at [facility].   Objective:  Vital signs in last 24 hours:  Temp:  [98.2 F (36.8 C)-98.9 F (37.2 C)] 98.8 F (37.1 C) (10/24 1327) Pulse Rate:  [71-82] 79 (10/24 1327) Resp:  [12-33] 19 (10/24 1248) BP: (91-169)/(53-103) 158/67 (10/24 1327) SpO2:  [91 %-100 %] 100 % (10/24 1327) Weight:  [92.3 kg-92.9 kg] 92.3 kg (10/24 1245)  Weight change:  Filed Weights   06/06/18 0017 06/07/18 0922 06/07/18 1245  Weight: 93.2 kg 92.9 kg 92.3 kg    Intake/Output: I/O last 3 completed shifts: In: -  Out: 2239 [Other:2239]   Intake/Output this shift:  Total I/O In: -  Out: 1001 [Other:1001]  Physical Exam: General: No acute distress  Head: Normocephalic, atraumatic. Moist oral mucosal membranes  Eyes: Anicteric  Neck: Supple, trachea midline  Lungs:  Clear to auscultation, normal effort  Heart: S1S2 no rubs  Abdomen:  Soft, nontender, bowel sounds present  Extremities: Trace peripheral edema.  Neurologic: Awake, alert, following commands  Skin: No lesions  Access: LUE AVG    Basic Metabolic Panel: Recent Labs  Lab 06/01/18 0123 06/05/18 0800 06/05/18 2042 06/07/18 1023  NA 134* 137  --  136  K 3.7 4.0  --  3.4*  CL 91* 97*  --  93*  CO2 27 25  --  28  GLUCOSE 126* 120*  --  85  BUN 58* 62*  --  54*  CREATININE 9.88* 11.42*  --  9.82*  CALCIUM 8.1* 8.7*  --  8.0*  PHOS 8.7*  --  2.9 4.0    Liver Function Tests: Recent Labs  Lab 06/01/18 0123 06/05/18 0800 06/07/18 1023  AST  --  24  --   ALT  --  9  --   ALKPHOS  --  55  --   BILITOT  --  1.4*  --   PROT  --  7.9  --   ALBUMIN 3.9 4.3 3.7   Recent Labs  Lab 06/05/18 0800  LIPASE 41   No results for input(s): AMMONIA in the last 168 hours.  CBC: Recent Labs  Lab 06/01/18 0123 06/05/18 0800  06/07/18 1023  WBC 11.8* 9.2 7.3  HGB 8.8* 10.3* 8.7*  HCT 28.8* 31.1* 26.6*  MCV 92.0 88.6 89.6  PLT 185 316 307    Cardiac Enzymes: Recent Labs  Lab 06/05/18 0819  TROPONINI 0.06*    BNP: Invalid input(s): POCBNP  CBG: Recent Labs  Lab 06/01/18 1114 06/01/18 1632 06/01/18 2127 06/02/18 0800 06/02/18 1159  GLUCAP 134* 111* 121* 26 95    Microbiology: Results for orders placed or performed during the hospital encounter of 09/19/15  Culture, blood (Routine X 2) w Reflex to ID Panel     Status: None   Collection Time: 09/19/15  4:07 AM  Result Value Ref Range Status   Specimen Description BLOOD RIGHT ARM  Final   Special Requests BOTTLES DRAWN AEROBIC AND ANAEROBIC 5ML  Final   Culture NO GROWTH 5 DAYS  Final   Report Status 09/24/2015 FINAL  Final  Culture, blood (Routine X 2) w Reflex to ID Panel     Status: None   Collection Time: 09/19/15  4:07 AM  Result Value Ref Range Status   Specimen Description BLOOD RIGHT HAND  Final   Special Requests BOTTLES DRAWN AEROBIC  AND ANAEROBIC 5ML  Final   Culture NO GROWTH 5 DAYS  Final   Report Status 09/24/2015 FINAL  Final  MRSA PCR Screening     Status: None   Collection Time: 09/19/15  7:19 AM  Result Value Ref Range Status   MRSA by PCR NEGATIVE NEGATIVE Final    Comment:        The GeneXpert MRSA Assay (FDA approved for NASAL specimens only), is one component of a comprehensive MRSA colonization surveillance program. It is not intended to diagnose MRSA infection nor to guide or monitor treatment for MRSA infections.   Culture, expectorated sputum-assessment     Status: None   Collection Time: 09/19/15  1:40 PM  Result Value Ref Range Status   Specimen Description SPUTUM  Final   Special Requests NONE  Final   Sputum evaluation   Final    MICROSCOPIC FINDINGS SUGGEST THAT THIS SPECIMEN IS NOT REPRESENTATIVE OF LOWER RESPIRATORY SECRETIONS. PLEASE RECOLLECT. Gram Stain Report Called to,Read Back By and  Verified With: R BARBER,RN AT 6295 09/19/15 BY L BENFIELD    Report Status 09/19/2015 FINAL  Final  Culture, expectorated sputum-assessment     Status: None   Collection Time: 09/19/15 10:20 PM  Result Value Ref Range Status   Specimen Description SPUTUM  Final   Special Requests NONE  Final   Sputum evaluation   Final    THIS SPECIMEN IS ACCEPTABLE. RESPIRATORY CULTURE REPORT TO FOLLOW.   Report Status 09/20/2015 FINAL  Final  Culture, respiratory (NON-Expectorated)     Status: None   Collection Time: 09/19/15 10:20 PM  Result Value Ref Range Status   Specimen Description SPUTUM  Final   Special Requests NONE  Final   Gram Stain   Final    ABUNDANT WBC PRESENT, PREDOMINANTLY PMN RARE SQUAMOUS EPITHELIAL CELLS PRESENT MODERATE GRAM POSITIVE COCCI IN PAIRS IN CLUSTERS FEW GRAM NEGATIVE RODS RARE GRAM POSITIVE RODS THIS SPECIMEN IS ACCEPTABLE FOR SPUTUM CULTURE Performed at [facility]    Culture   Final    NORMAL OROPHARYNGEAL FLORA Performed at [facility]    Report Status 09/22/2015 FINAL  Final    Coagulation Studies: No results for input(s): LABPROT, INR in the last 72 hours.  Urinalysis: Recent Labs    06/05/18 0807  COLORURINE AMBER*  LABSPEC 1.013  PHURINE 5.0  GLUCOSEU NEGATIVE  HGBUR MODERATE*  BILIRUBINUR NEGATIVE  KETONESUR NEGATIVE  PROTEINUR 100*  NITRITE NEGATIVE  LEUKOCYTESUR SMALL*      Imaging: No results found.   Medications:    . b complex-vitamin c-folic acid  1 tablet Oral Daily  . calcium acetate  1,334 mg Oral TID WC  . carvedilol  25 mg Oral BID WC  . clopidogrel  75 mg Oral Daily  . diltiazem  180 mg Oral Daily  . furosemide  80 mg Oral BID  . gabapentin  300 mg Oral QHS  . heparin  5,000 Units Subcutaneous Q8H  . hydrALAZINE  100 mg Oral TID  . pravastatin  40 mg Oral Daily  . sodium chloride flush  3 mL Intravenous Q12H   acetaminophen **OR** acetaminophen, fluticasone, metoprolol tartrate, ondansetron  **OR** ondansetron (ZOFRAN) IV, oxyCODONE, promethazine  Assessment/ Plan:  62 y.o. male with end stage renal disease on hemodialysis, obstructive sleep apnea, coronary artery disease, hypertension, hyperlipidemia, diabetes mellitus type II, CVA, congestive heart failure , who was admitted to [facility] on 06/05/2018 for Intractable nausea and vomiting   [facility] TTS [facility]  [place] left AVG  1.  ESRD on HD TTS.  Patient completed hemodialysis today.  His next outpatient dialysis will be performed here locally in [place].  HD at [place]. Has been set up.   2.  Anemia chronic kidney disease.  Hemoglobin will need monitoring as an outpatient.  He will likely receive Mircera as an outpt.   3.  Hypertension.  Maintain the patient on diltiazem and hydralazine.  4.  Secondary hyperparathyroidism.  ontinue calcium acetate 2 tablets by mouth 3 times a day with meals.  Phosphorous acceptable at 2.9.   LOS: 2 Ustin Cruickshank 10/24/20194:31 PM

## 2018-06-07 NOTE — Care Management Note (Signed)
Case Management Note  Patient Details  Name: ARAVIND CHRISMER MRN: [id number] Date of Birth: 01-May-1956   Patient to discharge today.  Patient will be staying at his sisters home in [place] thru the weekend.  Normally the patient goes to outpatient HD in .  Patient will be dialyzing in [place] this Saturday.  Per Elvera Bicker HD liaison she has a confirmed center, just not a time.  They will be notifying the patient.  Nephrology and hospitalist have been updated.   Patient agreeable to home health services.  Patient states he does not have a preference of home health agency.  Referral made to [facility] with [place].  Corene Cornea has met with the patient at bedside.  Patient has requested a start of care at his home address in [place] that is listed on the facesheet.  Patient's daughter confirms that she will be able to transport patient to HD  Subjective/Objective:                    Action/Plan:   Expected Discharge Date:  06/07/18               Expected Discharge Plan:  [place]  In-House Referral:     Discharge planning Services  CM Consult  Post Acute Care Choice:  Home Health Choice offered to:  Patient  DME Arranged:    DME Agency:     HH Arranged:  RN, PT, Nurse's Aide [place] Agency:  [place]  Status of Service:  Completed, signed off  If discussed at [place] of Stay Meetings, dates discussed:    Additional Comments:  Beverly Sessions, RN 06/07/2018, 4:25 PM

## 2018-06-07 NOTE — Discharge Summary (Signed)
[place] at [place] NAME: Charles Hart    MR#:  [id number]  DATE OF BIRTH:  [birth year]  DATE OF ADMISSION:  06/05/2018 ADMITTING PHYSICIAN: Gorden Harms, MD  DATE OF DISCHARGE: 06/07/2018   PRIMARY CARE PHYSICIAN: Lucious Groves, DO    ADMISSION DIAGNOSIS:  Intractable nausea and vomiting [R11.2]  DISCHARGE DIAGNOSIS:  Active Problems:   Ileus ([place])   SECONDARY DIAGNOSIS:   Past Medical History:  Diagnosis Date  . Adenomatous colon polyp 07/02/2011   Last colonoscopy May 06, 2011 by Dr. Owens Loffler, who recommended repeat colonoscopy in 5 years.   . Anemia   . Background diabetic retinopathy 04/20/2012   Patient is followed by Dr. Katy Fitch   . Cardiomyopathy    LV function improved from 2004 to 2008.  Historically, moderately dilated LV with EF 30-40% by 2D echo 08/14/2002.  Mild CAD with severe LV dysfunction by cardiac cath 09/2002.  Normal coronary arteries and normal LV function by cardiac cath 09/19/2006.  A 2-D echo on 04/01/2009 showed mild concentric hypertrophy and normal systolic (LVEF  88-32%) and doppler C/W with grade 1 diastolic dysfunction.  . CHF (congestive heart failure) ([place])    LV function improved from 2004 to 2008.  Historically, moderately dilated LV with EF 30-40% by 2D echo 08/14/2002.  Mild CAD with severe LV dysfunction by cardiac cath 09/2002.  Normal coronary arteries and normal LV function by cardiac cath 09/19/2006.  A 2-D echo on 04/01/2009 showed mild concentric hypertrophy and normal systolic (LVEF  54-98%) and doppler C/W with grade 1 diastolic dysfunction..   . Chronic combined systolic and diastolic congestive heart failure ([place]) 05/21/2010   LV function improved from 2004 to 2008.  Historically, moderately dilated LV with EF 30-40% by 2D echo 08/14/2002.  Mild CAD with severe LV dysfunction by cardiac cath 09/2002.  Normal coronary arteries and normal LV function by cardiac cath 09/19/2006.  A 2-D  echo on 04/01/2009 showed mild concentric hypertrophy and normal systolic (LVEF  26-41%) and doppler parameters consistent with abnormal left   . CVA (cerebral vascular accident) ([place]) 07/04/2012   MRI of the brain 07/04/2012 showed an acute infarct in the right basal ganglia involving the anterior putamen, anterior limb internal capsule, and head of the caudate; this measured approximately 2.5 cm in diameter.     . Dermatitis   . Diabetes mellitus    type 2  . DIABETIC PERIPHERAL NEUROPATHY 08/03/2007   Qualifier: Diagnosis of  By: Marinda Elk MD, Sonia Side    . DM neuropathy, painful (HCC)    fingers and right knee  . ESRD (end stage renal disease) ([place])    Stage 4, on hemodailysis x 4 months as of 05-18-18 [facility], tues thurs sat  . ESRD (end stage renal disease) on dialysis ([place])    "TTS; [facility]" (05/31/2018)  . Hearing loss in right ear   . Hyperlipidemia   . Hypertension   . Hypertensive crisis 07/28/2012  . Hypertensive urgency 08/20/2014  . Myocardial infarction [facility])  many yrs ago  . Nephrotic syndrome 02/18/2013   A 24-hour urine collection 03/04/2013 showed total protein of 5,460 g and creatinine clearance of 80 mL/minute.  Patient was seen by Seward Meth at [place] and a repeat 24-hour urine showed 10,407 mg protein.  Patient underwent kidney biopsy on 05/30/2013; pathology showed advanced diffuse and nodular diabetic nephropathy with vascular changes consistent with long-standing difficult to control hypertension.     [name]  Pneumonia 2014 and 2015  . Renal insufficiency   . Sleep apnea    uses cpap "sometimes per pt"    HOSPITAL COURSE:   62 year old male with past medical history of end-stage renal disease on hemodialysis, diabetes, hypertension, obstructive sleep apnea, status post recent left nephrectomy who presented to the hospital due to abdominal pain, nausea vomiting.  1.  Abdominal pain/nausea/vomitingdue to  mild ileus  postoperatively.  Patient is status post recent left-sided nephrectomy.  Patient CT scan of the abdomen pelvis showed some fluid collections which as per surgery is mostly postoperative/postsurgical imaging findings.  No acute evidence of bowel obstruction or anything that requires surgical intervention. He has tolerated his diet.  2.  End-stage renal disease on hemodialysis: Patient will continue dialysis Tuesday, Thursday and Saturday as per his routine schedule.    3.  Essential hypertension;  Continue hydralazine, Cardizem, carvedilol.  4.  Hyperlipidemia" Continue Pravachol.  5.  Neuropathy; continue gabapentin.     DISCHARGE CONDITIONS AND DIET:   Stable Renal diet  CONSULTS OBTAINED:  Treatment Team:  Vickie Epley, MD Anthonette Legato, MD  DRUG ALLERGIES:   Allergies  Allergen Reactions  . Ivp Dye [Iodinated Diagnostic Agents] Other (See Comments)    Per patient's Nephrologist, he doesn't want the patient exposed to ANY dye because of issues with his kidneys  . Hydrocodone     Caused hands and feet to peel   . Amlodipine Swelling  . Lisinopril Cough  . Red Dye Other (See Comments)    NO dye of any kind (issues with his kidneys)    DISCHARGE MEDICATIONS:   Allergies as of 06/07/2018      Reactions   Ivp Dye [iodinated Diagnostic Agents] Other (See Comments)   Per patient's Nephrologist, he doesn't want the patient exposed to ANY dye because of issues with his kidneys   Hydrocodone    Caused hands and feet to peel    Amlodipine Swelling   Lisinopril Cough   Red Dye Other (See Comments)   NO dye of any kind (issues with his kidneys)      Medication List    TAKE these medications   calcium acetate 667 MG capsule Commonly known as:  PHOSLO Take 1,334 mg by mouth 3 (three) times daily with meals.   CARTIA XT 180 MG 24 hr capsule Generic drug:  diltiazem Take 1 capsule (180 mg total) by mouth daily. TAKE 1 CAPSULE(180 MG) BY MOUTH DAILY    carvedilol 25 MG tablet Commonly known as:  COREG Take 25 mg by mouth 2 (two) times daily with a meal.   clopidogrel 75 MG tablet Commonly known as:  PLAVIX Take 75 mg by mouth daily.   fluticasone 50 MCG/ACT nasal spray Commonly known as:  FLONASE Place 2 sprays into both nostrils as needed for allergies.   folic acid-vitamin b complex-vitamin c-selenium-zinc 3 MG Tabs tablet Take 1 tablet by mouth daily.   furosemide 80 MG tablet Commonly known as:  LASIX Take 80 mg by mouth 2 (two) times daily.   gabapentin 300 MG capsule Commonly known as:  NEURONTIN Take 300 mg by mouth at bedtime.   hydrALAZINE 100 MG tablet Commonly known as:  APRESOLINE Take 1 tablet (100 mg total) by mouth 3 (three) times daily.   oxyCODONE 5 MG immediate release tablet Commonly known as:  Oxy IR/ROXICODONE Take 1 tablet (5 mg total) by mouth every 4 (four) hours as needed for moderate pain.   pravastatin 40 MG  tablet Commonly known as:  PRAVACHOL Take 1 tablet (40 mg total) by mouth daily.   promethazine 25 MG tablet Commonly known as:  PHENERGAN Take 1 tablet (25 mg total) by mouth every 6 (six) hours as needed for nausea or vomiting.         Today   CHIEF COMPLAINT:  Doing well seen in HD this am   VITAL SIGNS:  Blood pressure (!) 152/72, pulse 77, temperature 98.9 F (37.2 C), temperature source Oral, resp. rate 13, height 6' (1.829 m), weight 92.9 kg, SpO2 99 %.   REVIEW OF SYSTEMS:  ROS   PHYSICAL EXAMINATION:  GENERAL:  62 y.o.-year-old patient lying in the bed with no acute distress.  NECK:  Supple, no jugular venous distention. No thyroid enlargement, no tenderness.  LUNGS: Normal breath sounds bilaterally, no wheezing, rales,rhonchi  No use of accessory muscles of respiration.  CARDIOVASCULAR: S1, S2 normal. No murmurs, rubs, or gallops.  ABDOMEN: Soft, non-tender, non-distended. Bowel sounds present. No organomegaly or mass.  EXTREMITIES: No pedal edema,  cyanosis, or clubbing.  PSYCHIATRIC: The patient is alert and oriented x 3.  SKIN: No obvious rash, lesion, or ulcer.   DATA REVIEW:   CBC Recent Labs  Lab 06/07/18 1023  WBC 7.3  HGB 8.7*  HCT 26.6*  PLT 307    Chemistries  Recent Labs  Lab 06/05/18 0800 06/07/18 1023  NA 137 136  K 4.0 3.4*  CL 97* 93*  CO2 25 28  GLUCOSE 120* 85  BUN 62* 54*  CREATININE 11.42* 9.82*  CALCIUM 8.7* 8.0*  AST 24  --   ALT 9  --   ALKPHOS 55  --   BILITOT 1.4*  --     Cardiac Enzymes Recent Labs  Lab 06/05/18 0819  TROPONINI 0.06*    Microbiology Results  @[email or web address] @  RADIOLOGY:  Ct Abdomen Pelvis Wo Contrast  Result Date: 06/05/2018 CLINICAL DATA:  Nausea, vomiting, recent nephrectomy for renal cell carcinoma EXAM: CT ABDOMEN AND PELVIS WITHOUT CONTRAST TECHNIQUE: Multidetector CT imaging of the abdomen and pelvis was performed following the standard protocol without IV contrast. COMPARISON:  CT abdomen and pelvis of 04/04/2018 and abdomen plain films 06/05/2017 FINDINGS: Lower chest: There has been slight increase in bibasilar linear atelectasis. A small nodular opacity posteriorly in the left lower lobe on image 10 series 3 may be due to atelectasis but attention to this area on follow-up chest x-ray is recommended. This nodule was not present on chest x-ray of 04/04/2018 and therefore most likely is inflammatory or infectious in origin. Again free intraperitoneal air is noted scattered throughout the peritoneal cavity. Hepatobiliary: The liver is unremarkable in the unenhanced state. There is a small amount of perihepatic fluid present. A single gallstone is noted within the gallbladder. The gallbladder is not distended and no gallbladder wall thickening is seen. There may be sludge layering within the gallbladder is well. Pancreas: The pancreas is normal in size and the pancreatic duct is not dilated. Spleen: Spleen is unremarkable. However there is perisplenic fluid present.  Adrenals/Urinary Tract: The right adrenal gland is unremarkable. The left adrenal adrenal gland is not well seen and may have been resected at the time of left nephrectomy. There is a line of surgical sutures noted within the left retroperitoneum medially. Stomach/Bowel: The stomach is slightly distended with fluid with no obvious abnormality. No small bowel dilatation or edema is seen. There is a higher attenuation collection adjacent to the junction of the descending colon and  proximal sigmoid colon measuring 2.5 x 3.0 cm. This has attenuation of 66 HU and may represent blood. There are also at least 2 other collections of higher attenuation fluid in the left pelvis suspicious for blood as well. If surgery was 1 week ago then blood from that surgical intervention would most likely be lower in attenuation at this point. Etiology of these collections of blood is uncertain and clinical correlation is recommended. At least 2 surgical sutures are noted within the left pelvis near these collections. The terminal ileum and the appendix are unremarkable. Vascular/Lymphatic: The abdominal aorta is normal caliber with mild abdominal aortic atherosclerosis noted. No adenopathy is seen. Reproductive: The prostate is slightly prominent measuring 4.4 x 5.1 cm. Other: Strandiness is noted in the left lower quadrant subcutaneous soft tissues most likely due to the recent surgery. Musculoskeletal: There are degenerative changes noted in both hips. The lumbar spine are in normal alignment with normal intervertebral disc spaces. Diffuse anterior osteophyte formation is noted. IMPRESSION: 1. In this patient who reportedly underwent left nephrectomy 1 week ago, there are several small collections of higher attenuation fluid within the left lower quadrant-left pelvis. These collections are most consistent with blood which generally would be low in attenuation 1 week after surgery. Clinical correlation is recommended. 2. Single small  calcified gallstone. 3. Scattered bubbles of free air throughout the peritoneal and to a lesser degree retroperitoneal spaces most likely due to recent surgery. 4. Some fluid within the peritoneal cavity most likely postoperative as well. Electronically Signed   By: Ivar Drape M.D.   On: 06/05/2018 13:37      Allergies as of 06/07/2018      Reactions   Ivp Dye [iodinated Diagnostic Agents] Other (See Comments)   Per patient's Nephrologist, he doesn't want the patient exposed to ANY dye because of issues with his kidneys   Hydrocodone    Caused hands and feet to peel    Amlodipine Swelling   Lisinopril Cough   Red Dye Other (See Comments)   NO dye of any kind (issues with his kidneys)      Medication List    TAKE these medications   calcium acetate 667 MG capsule Commonly known as:  PHOSLO Take 1,334 mg by mouth 3 (three) times daily with meals.   CARTIA XT 180 MG 24 hr capsule Generic drug:  diltiazem Take 1 capsule (180 mg total) by mouth daily. TAKE 1 CAPSULE(180 MG) BY MOUTH DAILY   carvedilol 25 MG tablet Commonly known as:  COREG Take 25 mg by mouth 2 (two) times daily with a meal.   clopidogrel 75 MG tablet Commonly known as:  PLAVIX Take 75 mg by mouth daily.   fluticasone 50 MCG/ACT nasal spray Commonly known as:  FLONASE Place 2 sprays into both nostrils as needed for allergies.   folic acid-vitamin b complex-vitamin c-selenium-zinc 3 MG Tabs tablet Take 1 tablet by mouth daily.   furosemide 80 MG tablet Commonly known as:  LASIX Take 80 mg by mouth 2 (two) times daily.   gabapentin 300 MG capsule Commonly known as:  NEURONTIN Take 300 mg by mouth at bedtime.   hydrALAZINE 100 MG tablet Commonly known as:  APRESOLINE Take 1 tablet (100 mg total) by mouth 3 (three) times daily.   oxyCODONE 5 MG immediate release tablet Commonly known as:  Oxy IR/ROXICODONE Take 1 tablet (5 mg total) by mouth every 4 (four) hours as needed for moderate pain.    pravastatin 40 MG  tablet Commonly known as:  PRAVACHOL Take 1 tablet (40 mg total) by mouth daily.   promethazine 25 MG tablet Commonly known as:  PHENERGAN Take 1 tablet (25 mg total) by mouth every 6 (six) hours as needed for nausea or vomiting.          Management plans discussed with the patient and he is in agreement. Stable for discharge   Patient should follow up with pcp  CODE STATUS:     Code Status Orders  (From admission, onward)         Start     Ordered   06/05/18 1705  Full code  Continuous     06/05/18 1704        Code Status History    Date Active Date Inactive Code Status Order ID Comments User Context   05/30/2018 1702 06/02/2018 1526 Full Code [id number]  Lattie Corns Inpatient   11/24/2017 1334 11/25/2017 1352 Full Code [id number]  Dagoberto Ligas, PA-C Inpatient   11/08/2017 2309 11/09/2017 2239 Full Code [id number]  Elwin Mocha, MD ED   02/25/2017 2301 02/27/2017 1755 Full Code [id number]  Zada Finders, MD ED   09/19/2015 0439 09/21/2015 2010 Full Code [id number]  Juluis Mire, MD ED   12/06/2014 0106 12/08/2014 1953 Full Code [id number]  Jerrye Noble, MD Inpatient   11/13/2014 1451 11/19/2014 1516 Full Code [id number]  Jones Bales, MD Inpatient   08/20/2014 1318 08/23/2014 1758 Full Code [id number]  Corky Sox, MD Inpatient   01/20/2014 2359 01/21/2014 1524 Full Code [id number]  Hester Mates, MD Inpatient   07/10/2013 0512 07/13/2013 1653 Full Code [id number]  Oswald Hillock, MD Inpatient   07/28/2012 2257 07/29/2012 1728 Full Code [id number]  Karlyn Agee, MD Inpatient   07/04/2012 0159 07/05/2012 1607 Full Code [id number]  Karlyn Agee, MD Inpatient      TOTAL TIME TAKING CARE OF THIS PATIENT: 38 minutes.    Note: This dictation was prepared with Dragon dictation along with smaller phrase technology. Any transcriptional errors that result from this process are unintentional.  Mikaela Hilgeman M.D on 06/07/2018 at 12:14 PM  Between 7am to  6pm - Pager - [phone] After 6pm go to [email or web address] - password [email or web address] [facility]  Office  [phone]  CC: Primary care physician; Lucious Groves, DO

## 2018-06-07 NOTE — Progress Notes (Signed)
Pre HD assessment    06/07/18 0923  Neurological  Level of Consciousness Alert  Orientation Level Oriented X4  Respiratory  Respiratory Pattern Regular;Unlabored  Chest Assessment Chest expansion symmetrical  Cardiac  Pulse Regular  ECG Monitor Yes  Cardiac Rhythm NSR  Ectopy Multifocal PVC's  Ectopy Frequency Occasional  Vascular  R Radial Pulse +2  L Radial Pulse +2  Edema Generalized;Right lower extremity;Left lower extremity  Integumentary  Integumentary (WDL) X  Skin Color Appropriate for ethnicity  Musculoskeletal  Musculoskeletal (WDL) X  Generalized Weakness Yes  Assistive Device None  GU Assessment  Genitourinary (WDL) X  Genitourinary Symptoms  (HD)  Psychosocial  Psychosocial (WDL) WDL  Patient Behaviors Cooperative;Calm  Needs Expressed Physical  Emotional support given Given to patient

## 2018-06-07 NOTE — Progress Notes (Signed)
HD tx start    06/07/18 0932  Vital Signs  Pulse Rate 75  Pulse Rate Source Monitor  Resp 15  BP (!) 153/75  BP Location Right Arm  BP Method Automatic  Patient Position (if appropriate) Lying  Oxygen Therapy  SpO2 99 %  O2 Device Room Air  During Hemodialysis Assessment  Blood Flow Rate (mL/min) 400 mL/min  Arterial Pressure (mmHg) -90 mmHg  Venous Pressure (mmHg) 220 mmHg  Transmembrane Pressure (mmHg) 60 mmHg  Ultrafiltration Rate (mL/min) 500 mL/min  Dialysate Flow Rate (mL/min) 600 ml/min  Conductivity: Machine  13.7  HD Safety Checks Performed Yes  Dialysis Fluid Bolus Normal Saline  Bolus Amount (mL) 250 mL  Intra-Hemodialysis Comments Tx initiated  Fistula / Graft Left Forearm Arteriovenous vein graft  Placement Date/Time: 11/24/17 1041   Placed prior to admission: No  Orientation: Left  Access Location: Forearm  Access Type: (c) Arteriovenous vein graft  Status Accessed  Needle Size 15

## 2018-06-07 NOTE — Progress Notes (Signed)
Post HD assessment. Pt tolerated tx well without c/o or complication. Net UF 1001, goal met.    06/07/18 1245  Vital Signs  Temp 98.3 F (36.8 C)  Temp Source Oral  Pulse Rate 77  Pulse Rate Source Monitor  Resp (!) 25  BP (!) 126/93  BP Location Right Arm  BP Method Automatic  Patient Position (if appropriate) Lying  Oxygen Therapy  SpO2 99 %  O2 Device Room Air  Dialysis Weight  Weight 92.3 kg  Type of Weight Post-Dialysis  Post-Hemodialysis Assessment  Rinseback Volume (mL) 250 mL  KECN 70 V  Dialyzer Clearance Lightly streaked  Duration of HD Treatment -hour(s) 3 hour(s)  Hemodialysis Intake (mL) 500 mL  UF Total -Machine (mL) 1501 mL  Net UF (mL) 1001 mL  Tolerated HD Treatment Yes  AVG/AVF Arterial Site Held (minutes) 10 minutes  AVG/AVF Venous Site Held (minutes) 10 minutes  Education / Care Plan  Dialysis Education Provided Yes  Documented Education in Care Plan Yes  Fistula / Graft Left Forearm Arteriovenous vein graft  Placement Date/Time: 11/24/17 1041   Placed prior to admission: No  Orientation: Left  Access Location: Forearm  Access Type: (c) Arteriovenous vein graft  Site Condition No complications  Fistula / Graft Assessment Present;Thrill;Bruit  Status Deaccessed  Drainage Description None

## 2018-06-07 NOTE — Progress Notes (Signed)
HD tx end    06/07/18 1236  Vital Signs  Pulse Rate 75  Pulse Rate Source Monitor  Resp (!) 23  BP (!) 142/76  BP Location Right Arm  BP Method Automatic  Patient Position (if appropriate) Lying  Oxygen Therapy  SpO2 98 %  O2 Device Room Air  During Hemodialysis Assessment  Dialysis Fluid Bolus Normal Saline  Bolus Amount (mL) 250 mL  Intra-Hemodialysis Comments Tx completed

## 2018-06-08 DIAGNOSIS — K668 Other specified disorders of peritoneum: Secondary | ICD-10-CM

## 2018-06-11 ENCOUNTER — Telehealth: Payer: Self-pay

## 2018-06-11 NOTE — Telephone Encounter (Signed)
Charles Hart with [facility] requesting VO. Please call back.

## 2018-06-12 NOTE — Telephone Encounter (Signed)
Agree with VO

## 2018-06-12 NOTE — Telephone Encounter (Signed)
[facility] - states pt lives alone (no longer with his sister) "hotel - like building"; states home situation not the best.  Requesting verbal orders for " Nursing 2 times a week x 2 then once a week x 1 ; also need for Education officer, museum and home health aide". VO given - if not appropriate, let me know.

## 2018-06-13 ENCOUNTER — Other Ambulatory Visit: Payer: Self-pay

## 2018-06-13 ENCOUNTER — Ambulatory Visit (INDEPENDENT_AMBULATORY_CARE_PROVIDER_SITE_OTHER): Payer: 59 | Admitting: Internal Medicine

## 2018-06-13 ENCOUNTER — Telehealth: Payer: Self-pay | Admitting: Internal Medicine

## 2018-06-13 ENCOUNTER — Encounter: Payer: Self-pay | Admitting: Internal Medicine

## 2018-06-13 VITALS — BP 141/58 | HR 65 | Temp 98.3°F | Ht 72.0 in | Wt 202.5 lb

## 2018-06-13 DIAGNOSIS — K567 Ileus, unspecified: Secondary | ICD-10-CM | POA: Diagnosis not present

## 2018-06-13 DIAGNOSIS — Z905 Acquired absence of kidney: Secondary | ICD-10-CM | POA: Diagnosis not present

## 2018-06-13 DIAGNOSIS — E1140 Type 2 diabetes mellitus with diabetic neuropathy, unspecified: Secondary | ICD-10-CM

## 2018-06-13 DIAGNOSIS — I5042 Chronic combined systolic (congestive) and diastolic (congestive) heart failure: Secondary | ICD-10-CM

## 2018-06-13 DIAGNOSIS — K9189 Other postprocedural complications and disorders of digestive system: Secondary | ICD-10-CM

## 2018-06-13 NOTE — Progress Notes (Signed)
CC: post-op ileus   HPI:  [name] is a 62 y.o. gentleman with PMHx listed below who presents for hospital follow-up after admission for abdominal pain, n/v related to post-op ileus. He is status post radical left nephrectomy on 10/16. 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 Since recent discharge, he denies fevers, abdominal pain, nausea or vomiting. He is having bowel movements which are loose. Patient notes he is significantly weak and deconditioned since his surgery. [facility] has set up home PT for him 2x per week starting Friday.   Past Medical History:  Diagnosis Date  . Adenomatous colon polyp 07/02/2011   Last colonoscopy May 06, 2011 by Dr. Owens Loffler, who recommended repeat colonoscopy in 5 years.   . Anemia   . Background diabetic retinopathy 04/20/2012   Patient is followed by Dr. Katy Fitch   . Cardiomyopathy    LV function improved from 2004 to 2008.  Historically, moderately dilated LV with EF 30-40% by 2D echo 08/14/2002.  Mild CAD with severe LV dysfunction by cardiac cath 09/2002.  Normal coronary arteries and normal LV function by cardiac cath 09/19/2006.  A 2-D echo on 04/01/2009 showed mild concentric hypertrophy and normal systolic (LVEF  40-98%) and doppler C/W with grade 1 diastolic dysfunction.  . CHF (congestive heart failure) ([place])    LV function improved from 2004 to 2008.  Historically, moderately dilated LV with EF 30-40% by 2D echo 08/14/2002.  Mild CAD with severe LV dysfunction by cardiac cath 09/2002.  Normal coronary arteries and normal LV function by cardiac cath 09/19/2006.  A 2-D echo on 04/01/2009 showed mild concentric hypertrophy and normal systolic (LVEF  11-91%) and doppler C/W with grade 1 diastolic dysfunction..   . Chronic combined systolic and diastolic congestive heart failure ([place]) 05/21/2010   LV function improved from 2004 to 2008.  Historically, moderately dilated LV with EF 30-40% by 2D echo 08/14/2002.  Mild CAD with severe  LV dysfunction by cardiac cath 09/2002.  Normal coronary arteries and normal LV function by cardiac cath 09/19/2006.  A 2-D echo on 04/01/2009 showed mild concentric hypertrophy and normal systolic (LVEF  47-82%) and doppler parameters consistent with abnormal left   . CVA (cerebral vascular accident) ([place]) 07/04/2012   MRI of the brain 07/04/2012 showed an acute infarct in the right basal ganglia involving the anterior putamen, anterior limb internal capsule, and head of the caudate; this measured approximately 2.5 cm in diameter.     . Dermatitis   . Diabetes mellitus    type 2  . DIABETIC PERIPHERAL NEUROPATHY 08/03/2007   Qualifier: Diagnosis of  By: Marinda Elk MD, Sonia Side    . DM neuropathy, painful (HCC)    fingers and right knee  . ESRD (end stage renal disease) ([place])    Stage 4, on hemodailysis x 4 months as of 05-18-18 [place] [facility], tues thurs sat  . ESRD (end stage renal disease) on dialysis ([place])    "TTS; [facility]" (05/31/2018)  . Hearing loss in right ear   . Hyperlipidemia   . Hypertension   . Hypertensive crisis 07/28/2012  . Hypertensive urgency 08/20/2014  . Myocardial infarction [facility])  many yrs ago  . Nephrotic syndrome 02/18/2013   A 24-hour urine collection 03/04/2013 showed total protein of 5,460 g and creatinine clearance of 80 mL/minute.  Patient was seen by Seward Meth at [place] and a repeat 24-hour urine showed 10,407 mg protein.  Patient underwent kidney biopsy on 05/30/2013; pathology  showed advanced diffuse and nodular diabetic nephropathy with vascular changes consistent with long-standing difficult to control hypertension.     . Pneumonia 2014 and 2015  . Renal insufficiency   . Sleep apnea    uses cpap "sometimes per pt"   Review of Systems:   Constitutional: negative for weight changes Cardio: negative for chest pain Resp: positive for shortness of breath, particularly with exertion   Physical Exam:  Vitals:   06/13/18  1314  BP: (!) 141/58  Pulse: 65  Temp: 98.3 F (36.8 C)  TempSrc: Oral  SpO2: 100%  Weight: 202 lb 8 oz (91.9 kg)  Height: 6' (1.829 m)   General: alert, pleasant gentleman, appears stated age, NAD CV: RRR; no murmurs, rubs or gallops Pulm: normal respiratory effort, lungs CTA bilaterally Abd: BS+; abdomen is soft, non-tender; well healing laparoscopic scars  MSK: low back without TTP  Est: no edema   Assessment & Plan:   See Encounters Tab for problem based charting.  Patient seen with Dr. Daryll Drown

## 2018-06-13 NOTE — Patient Instructions (Addendum)
Mr. Ayotte,  It was great meeting you! I am glad you are feeling better. I am hopeful that physical therapy will help get your endurance back.  I have placed the orders for a rollator and shower seat.  Please let us know if you have a problem getting these.  Be sure to follow-up with Dr. Tresa Moore on 11/4 at 10:30 am.   You will see Dr. Heber [place] for a follow-up visit in December.   Take care! Dr. Koleen Distance

## 2018-06-13 NOTE — Telephone Encounter (Signed)
Rec'd call from [place]. Requesting VO for PT 2 times a week for 3 weeks for, Strengthening, Personnel officer and to Establish a Exercise Program.

## 2018-06-13 NOTE — Assessment & Plan Note (Signed)
Patient presents for hospital follow-up after admission from 10/22-10/24 for abdominal pain, n/v secondary to postoperative ileus. Since discharge, he has not had recurrence of pain. He is moving his bowels. No n/v.  He has follow-up with urology on 11/4.  Patient does appear significantly deconditioned since 2 hospitalizations. He is set up for for home PT twice a week. Requested DME orders for Rollator and shower seat which I have placed today.   He will follow-up with Dr. Heber [place] in December.

## 2018-06-13 NOTE — Telephone Encounter (Signed)
Agree 

## 2018-06-13 NOTE — Telephone Encounter (Signed)
Return phone call to [place], [place] at [facility].  VO given for PT 2 times / week for 3 weeks for strengthening, gait training, and to establish an exercise program.  Will route to pcp to agreement/denial.  Laurence Compton, RN

## 2018-06-14 NOTE — Progress Notes (Signed)
Internal Medicine Clinic Attending  I saw and evaluated the patient.  I personally confirmed the key portions of the history and exam documented by Dr. Bloomfield and I reviewed pertinent patient test results.  The assessment, diagnosis, and plan were formulated together and I agree with the documentation in the resident's note.  

## 2018-07-02 ENCOUNTER — Ambulatory Visit (HOSPITAL_COMMUNITY)
Admission: RE | Admit: 2018-07-02 | Discharge: 2018-07-02 | Disposition: A | Discharge status: Home / Self Care | Payer: 59 | Source: Ambulatory Visit | Attending: Cardiology | Admitting: Cardiology

## 2018-07-02 DIAGNOSIS — E785 Hyperlipidemia, unspecified: Secondary | ICD-10-CM

## 2018-07-02 DIAGNOSIS — R0989 Other specified symptoms and signs involving the circulatory and respiratory systems: Secondary | ICD-10-CM

## 2018-07-18 ENCOUNTER — Telehealth: Payer: Self-pay

## 2018-07-18 DIAGNOSIS — R0989 Other specified symptoms and signs involving the circulatory and respiratory systems: Secondary | ICD-10-CM

## 2018-07-18 NOTE — Telephone Encounter (Signed)
-----   Message from Josue Hector, MD sent at 07/03/2018  3:01 PM EST ----- Plaque no stenosis f/u duplex in 2 years

## 2018-07-18 NOTE — Telephone Encounter (Signed)
Patient aware of carotid results. Per Dr. [name], plaque no stenosis f/u duplex in 2 years.

## 2018-07-26 ENCOUNTER — Ambulatory Visit: Payer: 59 | Admitting: Internal Medicine

## 2018-09-20 ENCOUNTER — Emergency Department (HOSPITAL_COMMUNITY)
Admission: EM | Admit: 2018-09-20 | Discharge: 2018-09-20 | Disposition: A | Discharge status: Home / Self Care | Payer: 59 | Attending: Emergency Medicine | Admitting: Emergency Medicine

## 2018-09-20 ENCOUNTER — Other Ambulatory Visit: Payer: Self-pay

## 2018-09-20 ENCOUNTER — Encounter (HOSPITAL_COMMUNITY): Payer: Self-pay | Admitting: Emergency Medicine

## 2018-09-20 DIAGNOSIS — I132 Hypertensive heart and chronic kidney disease with heart failure and with stage 5 chronic kidney disease, or end stage renal disease: Secondary | ICD-10-CM | POA: Diagnosis not present

## 2018-09-20 DIAGNOSIS — N189 Chronic kidney disease, unspecified: Secondary | ICD-10-CM | POA: Insufficient documentation

## 2018-09-20 DIAGNOSIS — Z7901 Long term (current) use of anticoagulants: Secondary | ICD-10-CM | POA: Insufficient documentation

## 2018-09-20 DIAGNOSIS — E1122 Type 2 diabetes mellitus with diabetic chronic kidney disease: Secondary | ICD-10-CM | POA: Insufficient documentation

## 2018-09-20 DIAGNOSIS — Z79899 Other long term (current) drug therapy: Secondary | ICD-10-CM | POA: Insufficient documentation

## 2018-09-20 DIAGNOSIS — N186 End stage renal disease: Secondary | ICD-10-CM | POA: Insufficient documentation

## 2018-09-20 DIAGNOSIS — I5042 Chronic combined systolic (congestive) and diastolic (congestive) heart failure: Secondary | ICD-10-CM | POA: Diagnosis not present

## 2018-09-20 DIAGNOSIS — Z992 Dependence on renal dialysis: Secondary | ICD-10-CM | POA: Insufficient documentation

## 2018-09-20 DIAGNOSIS — I10 Essential (primary) hypertension: Secondary | ICD-10-CM

## 2018-09-20 LAB — BASIC METABOLIC PANEL
Anion gap: 13 (ref 5–15)
BUN: 89 mg/dL — AB (ref 8–23)
CALCIUM: 6.7 mg/dL — AB (ref 8.9–10.3)
CO2: 21 mmol/L — ABNORMAL LOW (ref 22–32)
CREATININE: 9.37 mg/dL — AB (ref 0.61–1.24)
Chloride: 102 mmol/L (ref 98–111)
GFR calc Af Amer: 6 mL/min — ABNORMAL LOW (ref >60)
GFR, EST NON AFRICAN AMERICAN: 5 mL/min — AB (ref >60)
Glucose, Bld: 72 mg/dL (ref 70–99)
Potassium: 3.6 mmol/L (ref 3.5–5.1)
SODIUM: 136 mmol/L (ref 135–145)

## 2018-09-20 MED ORDER — CARTIA XT 180 MG PO CP24: 180.0000 mg | ORAL_CAPSULE | Freq: Every day | ORAL | 2 refills | Status: DC

## 2018-09-20 MED ORDER — GABAPENTIN 300 MG PO CAPS: 300.0000 mg | ORAL_CAPSULE | Freq: Every day | ORAL | 2 refills | Status: DC

## 2018-09-20 NOTE — Discharge Instructions (Signed)
Your testing today looks good I have talked to your dialysis center - you will need to get your dialysis on Saturday If you become more short of breath, or have increasing swelling or other concerning symptoms, return to the ER for emergency dialysis

## 2018-09-20 NOTE — ED Triage Notes (Signed)
Pt missed 2 dialysis treatments due to needing to take care of business. Went today, was told he would have to have labs done before they could do his dialysis. Pt denies any symptoms.

## 2018-09-20 NOTE — ED Provider Notes (Signed)
[facility] EMERGENCY DEPARTMENT Provider Note   CSN: [id number] Arrival date & time: 09/20/18  1505     History   Chief Complaint Chief Complaint  Patient presents with  . needs labs    HPI Charles Hart is a 63 y.o. male.  HPI  63 year old male, pleasant, history of stroke, CHF, kidney failure, has known requirement for dialysis ever since April 2019, his fistula access is in his left forearm, he states that he missed dialysis on Tuesday because he was trying to get find a house to rent as he does not like living in the apartments where he is, he was trying to get a job and had other things that kept him from going to dialysis.  When he went today telling them that he could not be there until 130 they told him to come get lab work to make sure he did not have the need for emergent dialysis and that he could not go there first.  The patient denies any other symptoms including fevers, chills, nausea, vomiting, swelling, shortness of breath, headache or any other symptoms.  Past Medical History:  Diagnosis Date  . Adenomatous colon polyp 07/02/2011   Last colonoscopy May 06, 2011 by Dr. Owens Loffler, who recommended repeat colonoscopy in 5 years.   . Anemia   . Background diabetic retinopathy 04/20/2012   Patient is followed by Dr. Katy Fitch   . Cardiomyopathy    LV function improved from 2004 to 2008.  Historically, moderately dilated LV with EF 30-40% by 2D echo 08/14/2002.  Mild CAD with severe LV dysfunction by cardiac cath 09/2002.  Normal coronary arteries and normal LV function by cardiac cath 09/19/2006.  A 2-D echo on 04/01/2009 showed mild concentric hypertrophy and normal systolic (LVEF  46-65%) and doppler C/W with grade 1 diastolic dysfunction.  . CHF (congestive heart failure) ([place])    LV function improved from 2004 to 2008.  Historically, moderately dilated LV with EF 30-40% by 2D echo 08/14/2002.  Mild CAD with severe LV dysfunction by cardiac cath 09/2002.  Normal  coronary arteries and normal LV function by cardiac cath 09/19/2006.  A 2-D echo on 04/01/2009 showed mild concentric hypertrophy and normal systolic (LVEF  99-35%) and doppler C/W with grade 1 diastolic dysfunction..   . Chronic combined systolic and diastolic congestive heart failure ([place]) 05/21/2010   LV function improved from 2004 to 2008.  Historically, moderately dilated LV with EF 30-40% by 2D echo 08/14/2002.  Mild CAD with severe LV dysfunction by cardiac cath 09/2002.  Normal coronary arteries and normal LV function by cardiac cath 09/19/2006.  A 2-D echo on 04/01/2009 showed mild concentric hypertrophy and normal systolic (LVEF  70-17%) and doppler parameters consistent with abnormal left   . CVA (cerebral vascular accident) ([place]) 07/04/2012   MRI of the brain 07/04/2012 showed an acute infarct in the right basal ganglia involving the anterior putamen, anterior limb internal capsule, and head of the caudate; this measured approximately 2.5 cm in diameter.     . Dermatitis   . Diabetes mellitus    type 2  . DIABETIC PERIPHERAL NEUROPATHY 08/03/2007   Qualifier: Diagnosis of  By: Marinda Elk MD, Sonia Side    . DM neuropathy, painful (HCC)    fingers and right knee  . ESRD (end stage renal disease) ([place])    Stage 4, on hemodailysis x 4 months as of 05-18-18 [place] [facility], tues thurs sat  . ESRD (end stage renal disease) on dialysis ([place])    "  TTS; [facility]" (05/31/2018)  . Hearing loss in right ear   . Hyperlipidemia   . Hypertension   . Hypertensive crisis 07/28/2012  . Hypertensive urgency 08/20/2014  . Myocardial infarction [facility])  many yrs ago  . Nephrotic syndrome 02/18/2013   A 24-hour urine collection 03/04/2013 showed total protein of 5,460 g and creatinine clearance of 80 mL/minute.  Patient was seen by Seward Meth at [place] and a repeat 24-hour urine showed 10,407 mg protein.  Patient underwent kidney biopsy on 05/30/2013; pathology showed advanced  diffuse and nodular diabetic nephropathy with vascular changes consistent with long-standing difficult to control hypertension.     . Pneumonia 2014 and 2015  . Renal insufficiency   . Sleep apnea    uses cpap "sometimes per pt"    Patient Active Problem List   Diagnosis Date Noted  . Ileus, postoperative ([place]) 06/13/2018  . Surgical pneumoperitoneum   . Ileus ([place]) 06/05/2018  . Renal mass 05/30/2018  . Trochanteric bursitis of right hip 04/25/2018  . Aortic atherosclerosis ([place]) 12/07/2017  . ESRD on hemodialysis ([place]) 11/24/2017  . S/P arteriovenous (AV) fistula creation 11/24/2017  . Dyspnea 11/08/2017  . Left renal mass 09/06/2017  . Acute on chronic combined systolic and diastolic CHF (congestive heart failure) ([place]) 02/25/2017  . Piriformis syndrome of right side 03/03/2016  . Muscle spasm of left shoulder area 03/03/2016  . Secondary hyperparathyroidism, renal ([place]) 02/09/2016  . Primary osteoarthritis of left knee 01/07/2016  . Low back pain 01/07/2016  . Joint stiffness 11/13/2015  . Age-related nuclear cataract of both eyes 10/07/2015  . Normocytic anemia 08/22/2014  . Health care maintenance 05/16/2014  . Nephrotic syndrome in diseases classified elsewhere 02/18/2013  . Obstructive sleep apnea 07/26/2012  . History of stroke 07/04/2012  . 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 ED (erectile dysfunction) 04/18/2012  . Benign neoplasm of colon 07/02/2011  . Chronic combined systolic and diastolic congestive heart failure ([place]) 05/21/2010  . Carotid bruit 04/01/2009  . Type 2 diabetes mellitus with diabetic neuropathy ([place]) 08/03/2007  . Hearing loss 07/30/2007  . Controlled type 2 diabetes mellitus with chronic kidney disease ([place]) 09/15/2006  . Other and unspecified hyperlipidemia 06/01/2006  . Essential (primary) hypertension 06/01/2006    Past Surgical History:  Procedure Laterality Date  . AV FISTULA PLACEMENT  Left 11/24/2017   Procedure: INSERTION OF ARTERIOVENOUS (AV) GORE-TEX GRAFT LEFT LOWER ARM;  Surgeon: Rosetta Posner, MD;  Location: [facility];  Service: Vascular;  Laterality: Left;  . CARDIAC CATHETERIZATION      4 times  . COLONOSCOPY    . FOOT SURGERY    . POLYPECTOMY    . ROBOT ASSISTED LAPAROSCOPIC NEPHRECTOMY Left 05/30/2018   Procedure: XI ROBOTIC ASSISTED LAPAROSCOPIC NEPHRECTOMY;  Surgeon: Alexis Frock, MD;  Location: [facility];  Service: Urology;  Laterality: Left;        Home Medications    Prior to Admission medications   Medication Sig Start Date End Date Taking? Authorizing Provider  calcium acetate (PHOSLO) 667 MG capsule Take 1,334 mg by mouth 3 (three) times daily with meals.  12/15/17   Tye Savoy, MD  CARTIA XT 180 MG 24 hr capsule Take 1 capsule (180 mg total) by mouth daily. TAKE 1 CAPSULE(180 MG) BY MOUTH DAILY 06/07/18   Bettey Costa, MD  carvedilol (COREG) 25 MG tablet Take 25 mg by mouth 2 (two) times daily with a meal.  09/06/17  [provider]  clopidogrel (PLAVIX) 75 MG tablet Take 75 mg by mouth daily.    [provider]  fluticasone (FLONASE) 50 MCG/ACT nasal spray Place 2 sprays into both nostrils as needed for allergies. 06/07/18   Bettey Costa, MD  folic acid-vitamin b complex-vitamin c-selenium-zinc (DIALYVITE) 3 MG TABS tablet Take 1 tablet by mouth daily.    [provider]  furosemide (LASIX) 80 MG tablet Take 80 mg by mouth 2 (two) times daily.    [provider]  gabapentin (NEURONTIN) 300 MG capsule Take 300 mg by mouth at bedtime.    [provider]  hydrALAZINE (APRESOLINE) 100 MG tablet Take 1 tablet (100 mg total) by mouth 3 (three) times daily. 09/07/17 09/16/18  Lucious Groves, DO  oxyCODONE (OXY IR/ROXICODONE) 5 MG immediate release tablet Take 1 tablet (5 mg total) by mouth every 4 (four) hours as needed for moderate pain. 06/02/18   McKenzie, Candee Furbish, MD  pravastatin (PRAVACHOL) 40 MG tablet Take 1  tablet (40 mg total) by mouth daily. 09/07/17   Lucious Groves, DO  promethazine (PHENERGAN) 25 MG tablet Take 1 tablet (25 mg total) by mouth every 6 (six) hours as needed for nausea or vomiting. 12/07/17   Lucious Groves, DO    Family History Family History  Problem Relation Age of Onset  . Aneurysm Father 32       died of rupture  . Colon cancer Sister     Social History Social History   Tobacco Use  . Smoking status: Never Smoker  . Smokeless tobacco: Never Used  Substance Use Topics  . Alcohol use: No    Alcohol/week: 0.0 standard drinks  . Drug use: No     Allergies   Ivp dye [iodinated diagnostic agents]; Hydrocodone; Amlodipine; Lisinopril; and Red dye   Review of Systems Review of Systems  All other systems reviewed and are negative.    Physical Exam Updated Vital Signs BP (!) 173/88 (BP Location: Right Arm)   Pulse 71   Temp 98.2 F (36.8 C) (Oral)   Resp 18   Ht 1.829 m (6')   Wt 87.1 kg   SpO2 96%   BMI 26.04 kg/m   Physical Exam Vitals signs and nursing note reviewed.  Constitutional:      General: He is not in acute distress.    Appearance: He is well-developed.  HENT:     Head: Normocephalic and atraumatic.     Mouth/Throat:     Pharynx: No oropharyngeal exudate.  Eyes:     General: No scleral icterus.       Right eye: No discharge.        Left eye: No discharge.     Conjunctiva/sclera: Conjunctivae normal.     Pupils: Pupils are equal, round, and reactive to light.  Neck:     Musculoskeletal: Normal range of motion and neck supple.     Thyroid: No thyromegaly.     Vascular: No JVD.  Cardiovascular:     Rate and Rhythm: Normal rate and regular rhythm.     Heart sounds: Normal heart sounds. No murmur. No friction rub. No gallop.      Comments: The fistula has a good thrill, no redness warmth or tenderness Pulmonary:     Effort: Pulmonary effort is normal. No respiratory distress.     Breath sounds: Normal breath sounds. No  wheezing or rales.  Abdominal:     General: Bowel sounds are normal. There  is no distension.     Palpations: Abdomen is soft. There is no mass.     Tenderness: There is no abdominal tenderness.  Musculoskeletal: Normal range of motion.        General: No tenderness.     Right lower leg: Edema present.     Left lower leg: Edema present.     Comments: Scant bilateral minimal pitting edema of the bilateral lower extremities at the mid tibia  Lymphadenopathy:     Cervical: No cervical adenopathy.  Skin:    General: Skin is warm and dry.     Findings: No erythema or rash.  Neurological:     Mental Status: He is alert.     Coordination: Coordination normal.  Psychiatric:        Behavior: Behavior normal.      ED Treatments / Results  Labs (all labs ordered are listed, but only abnormal results are displayed) Labs Reviewed  BASIC METABOLIC PANEL - Abnormal; Notable for the following components:      Result Value   CO2 21 (*)    BUN 89 (*)    Creatinine, Ser 9.37 (*)    Calcium 6.7 (*)    GFR calc non Af Amer 5 (*)    GFR calc Af Amer 6 (*)    All other components within normal limits    EKG None  Radiology No results found.  Procedures Procedures (including critical care time)  Medications Ordered in ED Medications - No data to display   Initial Impression / Assessment and Plan / ED Course  I have reviewed the triage vital signs and the nursing notes.  Pertinent labs & imaging results that were available during my care of the patient were reviewed by me and considered in my medical decision making (see chart for details).  Clinical Course as of Sep 20 1633  Thu Sep 20, 2018  1541 Discussed with the dialysis center nurse, she reports that the patient has missed dialysis on Tuesday, today he was not able to make it by 6:56 and her policy is that he would have to come tomorrow, they recommended that he come to the hospital for evaluation of lab work to make sure he  did not need emergent dialysis.  They can see him on Saturday, he cannot be seen tomorrow, labs pending to make this decision.   [BM]  1630 BMP shows normal K, and expected Cr and BUN, can f/u on Saturday.   [BM]    Clinical Course User Index [BM] Noemi Chapel, MD    The patient appears well, he has no signs or symptoms of any significant fluid overload.  Will check for electrolyte abnormalities and try to arrange close follow-up with dialysis.  Final Clinical Impressions(s) / ED Diagnoses   Final diagnoses:  Chronic kidney disease, unspecified CKD stage    ED Discharge Orders    None       Noemi Chapel, MD 09/20/18 1635

## 2018-09-27 ENCOUNTER — Other Ambulatory Visit: Payer: Self-pay

## 2018-09-27 ENCOUNTER — Ambulatory Visit (INDEPENDENT_AMBULATORY_CARE_PROVIDER_SITE_OTHER): Payer: 59 | Admitting: Internal Medicine

## 2018-09-27 ENCOUNTER — Encounter: Payer: Self-pay | Admitting: Internal Medicine

## 2018-09-27 VITALS — BP 168/74 | HR 70 | Wt 210.2 lb

## 2018-09-27 DIAGNOSIS — I700 Atherosclerosis of aorta: Secondary | ICD-10-CM

## 2018-09-27 DIAGNOSIS — I120 Hypertensive chronic kidney disease with stage 5 chronic kidney disease or end stage renal disease: Secondary | ICD-10-CM

## 2018-09-27 DIAGNOSIS — I10 Essential (primary) hypertension: Secondary | ICD-10-CM

## 2018-09-27 DIAGNOSIS — Z85528 Personal history of other malignant neoplasm of kidney: Secondary | ICD-10-CM

## 2018-09-27 DIAGNOSIS — E1122 Type 2 diabetes mellitus with diabetic chronic kidney disease: Secondary | ICD-10-CM | POA: Diagnosis not present

## 2018-09-27 DIAGNOSIS — R110 Nausea: Secondary | ICD-10-CM

## 2018-09-27 DIAGNOSIS — Z79899 Other long term (current) drug therapy: Secondary | ICD-10-CM

## 2018-09-27 DIAGNOSIS — N186 End stage renal disease: Secondary | ICD-10-CM

## 2018-09-27 DIAGNOSIS — Z85520 Personal history of malignant carcinoid tumor of kidney: Secondary | ICD-10-CM

## 2018-09-27 DIAGNOSIS — Z992 Dependence on renal dialysis: Secondary | ICD-10-CM

## 2018-09-27 DIAGNOSIS — Z905 Acquired absence of kidney: Secondary | ICD-10-CM

## 2018-09-27 DIAGNOSIS — N2581 Secondary hyperparathyroidism of renal origin: Secondary | ICD-10-CM

## 2018-09-27 DIAGNOSIS — I5042 Chronic combined systolic (congestive) and diastolic (congestive) heart failure: Secondary | ICD-10-CM

## 2018-09-27 DIAGNOSIS — N529 Male erectile dysfunction, unspecified: Secondary | ICD-10-CM

## 2018-09-27 MED ORDER — SILDENAFIL CITRATE 50 MG PO TABS: 50.0000 mg | ORAL_TABLET | ORAL | 2 refills | Status: DC | PRN

## 2018-09-27 MED ORDER — CARVEDILOL 25 MG PO TABS: 25.0000 mg | ORAL_TABLET | Freq: Two times a day (BID) | ORAL | 1 refills | Status: DC

## 2018-09-27 MED ORDER — FLUTICASONE PROPIONATE 50 MCG/ACT NA SUSP: 2.0000 | NASAL | 1 refills | Status: DC | PRN

## 2018-09-27 MED ORDER — PROMETHAZINE HCL 25 MG PO TABS: 25.0000 mg | ORAL_TABLET | Freq: Four times a day (QID) | ORAL | 1 refills | Status: DC | PRN

## 2018-09-28 NOTE — Assessment & Plan Note (Signed)
HPI: He reports that he is having difficulty maintaining erections.  We have discussed this at length at multiple previous visits.  I had previously prescribed him sildenafil after he discontinued nitrates he is no longer on any nitrates.  He previously never picked up his prescription of sildenafil due to cost.  He wants a new prescription to try to see if it works for him.  Assessment: Erectile dysfunction  Plan Trial of sildenafil 50 mg as needed

## 2018-09-28 NOTE — Assessment & Plan Note (Signed)
HPI: He is now on hemodialysis Tuesday Thursday Saturday he reports this is going well.  Although it sounds like he may have missed his hemodialysis to come to his appointment today which is not good.  Assessment ESRD on hemodialysis  Plan She had a importance of adherence with hemodialysis schedule.

## 2018-09-28 NOTE — Assessment & Plan Note (Signed)
I do not see that he is on phosphorus binders or calcitriol I suspect he is I simply do not have the updated records we will plan to obtain records from nephrology.

## 2018-09-28 NOTE — Progress Notes (Signed)
Subjective:  HPI: Charles Hart is a 63 y.o. male who presents for f/u DM, ESRD  Please see Assessment and Plan below for the status of his chronic medical problems.  Review of Systems: Review of Systems  Constitutional: Negative for fever.  Eyes: Negative for blurred vision.  Respiratory: Negative for cough.   Cardiovascular: Negative for chest pain.  Gastrointestinal: Positive for nausea (occasional, phennergan helps). Negative for vomiting.  Genitourinary: Negative for dysuria.  Musculoskeletal: Negative for myalgias.    Objective:  Physical Exam: Vitals:   09/27/18 0914 09/27/18 0949  BP: (!) 183/74 (!) 168/74  Pulse: 74 70  SpO2: 98%   Weight: 210 lb 3.2 oz (95.3 kg)    Physical Exam Vitals signs and nursing note reviewed.  Constitutional:      General: He is not in acute distress. HENT:     Head: Normocephalic and atraumatic.  Cardiovascular:     Rate and Rhythm: Normal rate and regular rhythm.  Pulmonary:     Effort: Pulmonary effort is normal.     Breath sounds: Normal breath sounds.  Abdominal:     Palpations: Abdomen is soft.  Musculoskeletal:     Comments: + bruit over left forarm    Assessment & Plan:  Essential (primary) hypertension HPI: He reports he is taking his blood pressure medications as prescribed.  He notes he is adherent with hemodialysis.  He reports he is currently taking Cardura 180 mg daily Lasix 80 mg on nondialysis days carvedilol 25 mg twice daily and hydralazine 100 mg 3 times daily.  No acute complaints.  Assessment essential hypertension above goal  Plan A little hesitant to adjust his blood pressure medications we will try to get updated records from nephrology I suspect that he may be adjusting his blood pressure medications as he gets used to hemodialysis.  Aortic atherosclerosis (HCC) Stable continue pravastatin.  Secondary hyperparathyroidism, renal ([place]) I do not see that he is on phosphorus binders or calcitriol  I suspect he is I simply do not have the updated records we will plan to obtain records from nephrology.  Controlled type 2 diabetes mellitus with chronic kidney disease ([place]) No issues he reports he is now just diet controlled.  He reports to me a recent A1c taken at hemodialysis was 4.8%.  We will plan to continue diet control of his diabetes.  Obtain records from nephrology.  ESRD on hemodialysis [facility]) HPI: He is now on hemodialysis Tuesday Thursday Saturday he reports this is going well.  Although it sounds like he may have missed his hemodialysis to come to his appointment today which is not good.  Assessment ESRD on hemodialysis  Plan She had a importance of adherence with hemodialysis schedule.  ED (erectile dysfunction) HPI: He reports that he is having difficulty maintaining erections.  We have discussed this at length at multiple previous visits.  I had previously prescribed him sildenafil after he discontinued nitrates he is no longer on any nitrates.  He previously never picked up his prescription of sildenafil due to cost.  He wants a new prescription to try to see if it works for him.  Assessment: Erectile dysfunction  Plan Trial of sildenafil 50 mg as needed  History of renal cell carcinoma Underwent left nephrectomy reviewed pathology results it was renal cell carcinoma margins were clear.  He is overall doing well although he did have a postop complication of ileus that is since resolved.   Medications Ordered Meds ordered this encounter  Medications  .  promethazine (PHENERGAN) 25 MG tablet    Sig: Take 1 tablet (25 mg total) by mouth every 6 (six) hours as needed for nausea or vomiting.    Dispense:  30 tablet    Refill:  1  . fluticasone (FLONASE) 50 MCG/ACT nasal spray    Sig: Place 2 sprays into both nostrils as needed for allergies.    Dispense:  16 g    Refill:  1  . carvedilol (COREG) 25 MG tablet    Sig: Take 1 tablet (25 mg total) by mouth 2 (two)  times daily with a meal.    Dispense:  180 tablet    Refill:  1  . sildenafil (VIAGRA) 50 MG tablet    Sig: Take 1 tablet (50 mg total) by mouth as needed for erectile dysfunction.    Dispense:  10 tablet    Refill:  2   Other Orders No orders of the defined types were placed in this encounter.  Follow Up: Return 4-6 months.

## 2018-09-28 NOTE — Assessment & Plan Note (Signed)
Stable continue pravastatin.

## 2018-09-28 NOTE — Assessment & Plan Note (Signed)
No issues he reports he is now just diet controlled.  He reports to me a recent A1c taken at hemodialysis was 4.8%.  We will plan to continue diet control of his diabetes.  Obtain records from nephrology.

## 2018-09-28 NOTE — Assessment & Plan Note (Signed)
HPI: He reports he is taking his blood pressure medications as prescribed.  He notes he is adherent with hemodialysis.  He reports he is currently taking Cardura 180 mg daily Lasix 80 mg on nondialysis days carvedilol 25 mg twice daily and hydralazine 100 mg 3 times daily.  No acute complaints.  Assessment essential hypertension above goal  Plan A little hesitant to adjust his blood pressure medications we will try to get updated records from nephrology I suspect that he may be adjusting his blood pressure medications as he gets used to hemodialysis.

## 2018-09-28 NOTE — Assessment & Plan Note (Signed)
Underwent left nephrectomy reviewed pathology results it was renal cell carcinoma margins were clear.  He is overall doing well although he did have a postop complication of ileus that is since resolved.

## 2018-11-08 ENCOUNTER — Ambulatory Visit: Payer: 59 | Admitting: General Surgery

## 2019-01-03 ENCOUNTER — Ambulatory Visit: Payer: 59 | Admitting: General Surgery

## 2019-01-08 ENCOUNTER — Other Ambulatory Visit: Payer: Self-pay

## 2019-01-16 ENCOUNTER — Encounter: Payer: Self-pay | Admitting: *Deleted

## 2019-01-24 ENCOUNTER — Encounter: Payer: 59 | Admitting: Internal Medicine

## 2019-01-25 NOTE — Progress Notes (Signed)
CARDIOLOGY OFFICE NOTE  Date:  01/25/2019    Charles Hart Date of Birth: Jan 09, 1956 Medical Record #[id number]  PCP:  Lucious Groves, DO  Cardiologist:  [name]    No chief complaint on file.   History of Present Illness: Charles Hart is a 63 y.o. male with history of poorly controlled HTN Complicated by renal failure and dialysis as well as CVA in 2013.  Non ischemic DCM with normal caths in 2012 and 2013 Went through a divorce in 2019.  Renal cell carcinoma found on CT September 2019 and had left radical nephrectomy with no cardiac complications 74/12/87 he gets dialysis on Tues/Thurs/Saturday They adjust his BP meds which can be labile He was turned down at [facility] for renal transplant He indicated one of his children by try to provide one I told him to talk to his nephrologist about wether one could be done with cardiomyopathy even if family member provided. He is functional class one      Past Medical History:  Diagnosis Date  . Adenomatous colon polyp 07/02/2011   Last colonoscopy May 06, 2011 by Dr. Owens Loffler, who recommended repeat colonoscopy in 5 years.   . Anemia   . Background diabetic retinopathy 04/20/2012   Patient is followed by Dr. Katy Fitch   . Cardiomyopathy    LV function improved from 2004 to 2008.  Historically, moderately dilated LV with EF 30-40% by 2D echo 08/14/2002.  Mild CAD with severe LV dysfunction by cardiac cath 09/2002.  Normal coronary arteries and normal LV function by cardiac cath 09/19/2006.  A 2-D echo on 04/01/2009 showed mild concentric hypertrophy and normal systolic (LVEF  86-76%) and doppler C/W with grade 1 diastolic dysfunction.  . CHF (congestive heart failure) ([place])    LV function improved from 2004 to 2008.  Historically, moderately dilated LV with EF 30-40% by 2D echo 08/14/2002.  Mild CAD with severe LV dysfunction by cardiac cath 09/2002.  Normal coronary arteries and normal LV function by cardiac cath 09/19/2006.  A 2-D  echo on 04/01/2009 showed mild concentric hypertrophy and normal systolic (LVEF  72-09%) and doppler C/W with grade 1 diastolic dysfunction..   . Chronic combined systolic and diastolic congestive heart failure ([place]) 05/21/2010   LV function improved from 2004 to 2008.  Historically, moderately dilated LV with EF 30-40% by 2D echo 08/14/2002.  Mild CAD with severe LV dysfunction by cardiac cath 09/2002.  Normal coronary arteries and normal LV function by cardiac cath 09/19/2006.  A 2-D echo on 04/01/2009 showed mild concentric hypertrophy and normal systolic (LVEF  47-09%) and doppler parameters consistent with abnormal left   . CVA (cerebral vascular accident) ([place]) 07/04/2012   MRI of the brain 07/04/2012 showed an acute infarct in the right basal ganglia involving the anterior putamen, anterior limb internal capsule, and head of the caudate; this measured approximately 2.5 cm in diameter.     . Dermatitis   . Diabetes mellitus    type 2  . DIABETIC PERIPHERAL NEUROPATHY 08/03/2007   Qualifier: Diagnosis of  By: Marinda Elk MD, Sonia Side    . DM neuropathy, painful (HCC)    fingers and right knee  . ESRD (end stage renal disease) ([place])    Stage 4, on hemodailysis x 4 months as of 05-18-18 [facility], tues thurs sat  . ESRD (end stage renal disease) on dialysis ([place])    "TTS; [facility]" (05/31/2018)  . Hearing loss in right ear   . Hyperlipidemia   .  Hypertension   . Hypertensive crisis 07/28/2012  . Hypertensive urgency 08/20/2014  . Myocardial infarction [facility])  many yrs ago  . Nephrotic syndrome 02/18/2013   A 24-hour urine collection 03/04/2013 showed total protein of 5,460 g and creatinine clearance of 80 mL/minute.  Patient was seen by Seward Meth at [facility] and a repeat 24-hour urine showed 10,407 mg protein.  Patient underwent kidney biopsy on 05/30/2013; pathology showed advanced diffuse and nodular diabetic nephropathy with vascular changes consistent with  long-standing difficult to control hypertension.     . Pneumonia 2014 and 2015  . Renal insufficiency   . Sleep apnea    uses cpap "sometimes per pt"    Past Surgical History:  Procedure Laterality Date  . AV FISTULA PLACEMENT Left 11/24/2017   Procedure: INSERTION OF ARTERIOVENOUS (AV) GORE-TEX GRAFT LEFT LOWER ARM;  Surgeon: Rosetta Posner, MD;  Location: [facility];  Service: Vascular;  Laterality: Left;  . CARDIAC CATHETERIZATION      4 times  . COLONOSCOPY    . FOOT SURGERY    . POLYPECTOMY    . ROBOT ASSISTED LAPAROSCOPIC NEPHRECTOMY Left 05/30/2018   Procedure: XI ROBOTIC ASSISTED LAPAROSCOPIC NEPHRECTOMY;  Surgeon: Alexis Frock, MD;  Location: [facility];  Service: Urology;  Laterality: Left;     Medications: No outpatient medications have been marked as taking for the 01/31/19 encounter (Appointment) with Josue Hector, MD.     Allergies: Allergies  Allergen Reactions  . Ivp Dye [Iodinated Diagnostic Agents] Other (See Comments)    Per patient's Nephrologist, he doesn't want the patient exposed to ANY dye because of issues with his kidneys  . Hydrocodone     Caused hands and feet to peel   . Amlodipine Swelling  . Lisinopril Cough  . Red Dye Other (See Comments)    NO dye of any kind (issues with his kidneys)    Social History: The patient  reports that he has never smoked. He has never used smokeless tobacco. He reports that he does not drink alcohol or use drugs.   Family History: The patient's family history includes Aneurysm (age of onset: 31) in his father; Colon cancer in his sister.   Review of Systems: Please see the history of present illness.   Otherwise, the review of systems is positive for none.   All other systems are reviewed and negative.   Physical Exam: VS:  There were no vitals taken for this visit. [name]  BMI There is no height or weight on file to calculate BMI.  Wt Readings from Last 3 Encounters:  09/27/18 95.3 kg  09/20/18 87.1 kg  06/13/18  91.9 kg    Affect appropriate Chronically ill black male  HEENT: normal Neck supple with no adenopathy JVP normal no bruits no thyromegaly Lungs clear with no wheezing and good diaphragmatic motion Heart:  S1/S2 no murmur, no rub, gallop or click PMI normal Abdomen: benighn, BS positve, no tenderness, no AAA no bruit.  No HSM or HJR Distal pulses intact with no bruits No edema Neuro non-focal Skin warm and dry No muscular weakness Left arm AV graft with thrill   LABORATORY DATA:  EKG:  NSR LVH with strain 06/08/18   Lab Results  Component Value Date   WBC 7.3 06/07/2018   HGB 8.7 (L) 06/07/2018   HCT 26.6 (L) 06/07/2018   PLT 307 06/07/2018   GLUCOSE 72 09/20/2018   CHOL 140 09/07/2017   TRIG 107 09/07/2017  HDL 34 (L) 09/07/2017   LDLDIRECT 80 07/18/2012   LDLCALC 85 09/07/2017   ALT 9 06/05/2018   AST 24 06/05/2018   NA 136 09/20/2018   K 3.6 09/20/2018   CL 102 09/20/2018   CREATININE 9.37 (H) 09/20/2018   BUN 89 (H) 09/20/2018   CO2 21 (L) 09/20/2018   TSH 2.782 07/04/2012   INR 1.14 11/09/2017   HGBA1C 4.8 05/18/2018   MICROALBUR 148.77 (H) 04/25/2014     BNP (last 3 results) No results for input(s): BNP in the last 8760 hours.  ProBNP (last 3 results) No results for input(s): PROBNP in the last 8760 hours.   Other Studies Reviewed Today:  Echo Study Conclusions 01/2018  - Left ventricle: The cavity size was mildly dilated. Wall   thickness was increased in a pattern of mild LVH. Systolic   function was moderately reduced. The estimated ejection fraction   was in the range of 35% to 40%. Diffuse hypokinesis. There was no   evidence of elevated ventricular filling pressure by Doppler   parameters. - Aorta: Aortic root dimension: 44 mm (ED). - Ascending aorta: The ascending aorta was mildly dilated. - Left atrium: The atrium was mildly dilated. - Right ventricle: The cavity size was moderately dilated. Wall   thickness was normal. - Right  atrium: The atrium was mildly dilated.  Impressions:  - EF has mildly improved when compared to prior (25%).   Echo  02/23/17 Study Conclusions  - Left ventricle: The cavity size was severely dilated. Wall thickness was increased in a pattern of moderate LVH. Systolic function was severely reduced. The estimated ejection fraction was in the range of 25% to 30%. Diffuse hypokinesis. Features are consistent with a pseudonormal left ventricular filling pattern, with concomitant abnormal relaxation and increased filling pressure (grade 2 diastolic dysfunction). Doppler parameters are consistent with high ventricular filling pressure. - Aortic valve: There was trivial regurgitation. - Aortic root: The aortic root was mildly dilated. - Mitral valve: Calcified annulus. There was mild regurgitation. - Left atrium: The atrium was moderately dilated. - Right atrium: The atrium was moderately dilated.  Impressions:  - Severe global reduction in LV systolic function; severe LVE; moderate LVH; moderate diastolic dysfunction with elevated LV filling pressure; mildly dilated aortic root; trace AI; mild MR; biatrial enlargement.  Carotids  07/02/18 :  Plaque no stenosis f/u 2 years   Assessment/Plan:  1. Combined Systolic Heart Failure: EF 35-40% not on ACE/ARB/Entresto due to renal failure continue Beta blocker hydralazine fluid balance via dialysis Although it is improved from 2018 would still likely keep him from having transplant  Will update echo since he is still actively pursuing transplant   2. ESRD - continue dialysis f/u nephrology  Turned down at [facility] for [facility]   3. Renal Cell Cancer:  Post radical left nephrectomy stable   4. HTN - BP ok here today - has had some lability with starting dialysis.    5. Anemia - most likely from CKD/ESRD. Encouraged him to ask renal about erythropoietin Rx  6. Prior CVA - on chronic Plavix. Likely HTN carotids  only plaque due to repeat November 2021  7. ED: not discussed   8. Carotid disease - for repeat study later this summer.   Current medicines are reviewed with the patient today.  The patient does not have concerns regarding medicines other than what has been noted above.  The following changes have been made:  See above.  Labs/ tests ordered today include:  No orders of the defined types were placed in this encounter.    Disposition:   FU with Dr. [name] as planned.  Patient is agreeable to this plan and will call if any problems develop in the interim.   Signed: [name], MD  01/25/2019 3:30 PM  [facility] [address] [place], [place]  [address] Phone: [phone] Fax: [phone]

## 2019-01-31 ENCOUNTER — Other Ambulatory Visit: Payer: Self-pay

## 2019-01-31 ENCOUNTER — Encounter: Payer: Self-pay | Admitting: Cardiovascular Disease

## 2019-01-31 ENCOUNTER — Ambulatory Visit (INDEPENDENT_AMBULATORY_CARE_PROVIDER_SITE_OTHER): Payer: 59 | Admitting: Cardiovascular Disease

## 2019-01-31 VITALS — BP 145/68 | HR 66 | Temp 99.0°F | Ht 71.5 in | Wt 197.0 lb

## 2019-01-31 DIAGNOSIS — I420 Dilated cardiomyopathy: Secondary | ICD-10-CM

## 2019-01-31 DIAGNOSIS — I151 Hypertension secondary to other renal disorders: Secondary | ICD-10-CM | POA: Diagnosis not present

## 2019-01-31 DIAGNOSIS — N2889 Other specified disorders of kidney and ureter: Secondary | ICD-10-CM

## 2019-01-31 NOTE — Patient Instructions (Signed)
Medication Instructions:  Your physician recommends that you continue on your current medications as directed. Please refer to the Current Medication list given to you today.   Labwork: NONE  Testing/Procedures: Your physician has requested that you have an echocardiogram. Echocardiography is a painless test that uses sound waves to create images of your heart. It provides your doctor with information about the size and shape of your heart and how well your heart's chambers and valves are working. This procedure takes approximately one hour. There are no restrictions for this procedure.    Follow-Up: Your physician wants you to follow-up in: 6 MONTHS. You will receive a reminder letter in the mail two months in advance. If you don't receive a letter, please call our office to schedule the follow-up appointment.   Any Other Special Instructions Will Be Listed Below (If Applicable).     If you need a refill on your cardiac medications before your next appointment, please call your pharmacy.

## 2019-02-06 ENCOUNTER — Other Ambulatory Visit: Payer: Self-pay

## 2019-02-06 ENCOUNTER — Ambulatory Visit (HOSPITAL_COMMUNITY)
Admission: RE | Admit: 2019-02-06 | Discharge: 2019-02-06 | Disposition: A | Discharge status: Home / Self Care | Payer: 59 | Source: Ambulatory Visit | Attending: Cardiovascular Disease | Admitting: Cardiovascular Disease

## 2019-02-06 DIAGNOSIS — I420 Dilated cardiomyopathy: Secondary | ICD-10-CM | POA: Diagnosis not present

## 2019-02-06 NOTE — Progress Notes (Signed)
*  PRELIMINARY RESULTS* Echocardiogram 2D Echocardiogram has been performed.  Leavy Cella 02/06/2019, 10:35 AM

## 2019-02-07 ENCOUNTER — Encounter: Payer: Self-pay | Admitting: Internal Medicine

## 2019-02-07 ENCOUNTER — Other Ambulatory Visit: Payer: Self-pay

## 2019-02-07 ENCOUNTER — Ambulatory Visit (INDEPENDENT_AMBULATORY_CARE_PROVIDER_SITE_OTHER): Payer: 59 | Admitting: Internal Medicine

## 2019-02-07 VITALS — BP 193/71 | HR 57 | Temp 98.4°F | Ht 72.0 in | Wt 196.1 lb

## 2019-02-07 DIAGNOSIS — I5042 Chronic combined systolic (congestive) and diastolic (congestive) heart failure: Secondary | ICD-10-CM

## 2019-02-07 DIAGNOSIS — Z9119 Patient's noncompliance with other medical treatment and regimen: Secondary | ICD-10-CM

## 2019-02-07 DIAGNOSIS — E1122 Type 2 diabetes mellitus with diabetic chronic kidney disease: Secondary | ICD-10-CM | POA: Diagnosis not present

## 2019-02-07 DIAGNOSIS — E1140 Type 2 diabetes mellitus with diabetic neuropathy, unspecified: Secondary | ICD-10-CM

## 2019-02-07 DIAGNOSIS — E113593 Type 2 diabetes mellitus with proliferative diabetic retinopathy without macular edema, bilateral: Secondary | ICD-10-CM

## 2019-02-07 DIAGNOSIS — Z992 Dependence on renal dialysis: Secondary | ICD-10-CM

## 2019-02-07 DIAGNOSIS — N186 End stage renal disease: Secondary | ICD-10-CM

## 2019-02-07 DIAGNOSIS — Z79899 Other long term (current) drug therapy: Secondary | ICD-10-CM

## 2019-02-07 DIAGNOSIS — I132 Hypertensive heart and chronic kidney disease with heart failure and with stage 5 chronic kidney disease, or end stage renal disease: Secondary | ICD-10-CM | POA: Diagnosis not present

## 2019-02-07 DIAGNOSIS — G4733 Obstructive sleep apnea (adult) (pediatric): Secondary | ICD-10-CM

## 2019-02-07 DIAGNOSIS — Z794 Long term (current) use of insulin: Secondary | ICD-10-CM

## 2019-02-07 DIAGNOSIS — I10 Essential (primary) hypertension: Secondary | ICD-10-CM

## 2019-02-07 MED ORDER — PRAVASTATIN SODIUM 40 MG PO TABS: 40.0000 mg | ORAL_TABLET | Freq: Every day | ORAL | 3 refills | Status: DC

## 2019-02-07 MED ORDER — AMLODIPINE BESYLATE 5 MG PO TABS: 5.0000 mg | ORAL_TABLET | Freq: Every day | ORAL | 3 refills | Status: DC

## 2019-02-07 NOTE — Assessment & Plan Note (Signed)
HPI he has a history of obstructive sleep apnea and has been prescribed CPAP therapy.  He reports intermittent use of CPAP he does not really think he needs it, no fatigue.  Assessment obstructive sleep apnea  Plan I discussed with him that he should continue his CPAP if he does not feel that he needs it then we should pursue a repeat sleep study to reevaluate this issue.  He will let me know what he would like to do.

## 2019-02-07 NOTE — Assessment & Plan Note (Signed)
HPI: He reports to me that he is taking carvedilol 25 mg twice daily as well as hydralazine 100 mg 3 times daily however he skips his blood pressure medications in the morning because he is instructed to do so by hemodialysis on HD days.  He has not yet taken his medications this morning.  He thinks that he is taking [place] but not sure.  Assessment essential hypertension above goal  Plan As noted above I will change his Cartia to amlodipine at 5 mg dose we will monitor for lower extremity edema. Have him continue carvedilol 25 mg twice daily I will also have him continue hydralazine 100 mg 3 times daily Ongoing adjustment with the assistance of nephrology

## 2019-02-07 NOTE — Assessment & Plan Note (Signed)
HPI: He reports doing well with his diabetes he is currently diet-controlled only.  He has had no hypoglycemic events or hyperglycemic.  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  Continue gabapentin 300 mg daily for diabetic neuropathy. Discussed he needs to follow-up with Dr. Carolynn Sayers for repeat diabetic eye exam.

## 2019-02-07 NOTE — Progress Notes (Signed)
Subjective:  HPI: [name] is a 63 y.o. male who presents for [facility] paperwork and physical, as well as HTN  Please see Assessment and Plan below for the status of his chronic medical problems.  Review of Systems: Review of Systems  Constitutional: Negative for fever, malaise/fatigue and weight loss.  HENT: Negative for hearing loss.   Eyes: Negative for blurred vision.  Respiratory: Negative for cough and shortness of breath.   Cardiovascular: Negative for chest pain and leg swelling.  Gastrointestinal: Negative for abdominal pain.  Musculoskeletal: Negative for myalgias.  Neurological: Negative for dizziness, sensory change, speech change, focal weakness and headaches.  Psychiatric/Behavioral: Negative for depression, substance abuse and suicidal ideas.    Objective:  Physical Exam: Vitals:   02/07/19 0855 02/07/19 1000  BP: (!) 189/78 (!) 193/71  Pulse: 63 (!) 57  Temp: 98.4 F (36.9 C)   TempSrc: Oral   SpO2: 100%   Weight: 196 lb 1.6 oz (89 kg)   Height: 6' (1.829 m)    Body mass index is 26.6 kg/m. Physical Exam Vitals signs and nursing note reviewed.  Constitutional:      Appearance: Normal appearance.  Cardiovascular:     Rate and Rhythm: Normal rate and regular rhythm.     Heart sounds: No murmur.  Pulmonary:     Effort: Pulmonary effort is normal.     Breath sounds: Normal breath sounds. No wheezing, rhonchi or rales.  Abdominal:     General: Abdomen is flat.     Palpations: Abdomen is soft.  Musculoskeletal:     Right lower leg: No edema.     Left lower leg: No edema.  Neurological:     General: No focal deficit present.     Mental Status: He is alert.     Motor: No weakness.     Coordination: Coordination normal.     Gait: Gait normal.  Psychiatric:        Mood and Affect: Mood normal.    Assessment & Plan:  Chronic combined systolic and diastolic congestive heart failure (HCC) HPI: He is recently saw his cardiologist Dr. [name].  He  has chronic combined congestive heart failure he is well compensated.  He tells me that he was recently denied from [facility] for a kidney transplant he was told this was due to his heart failure.  Dr. Alfonse Spruce has repeated a echocardiogram his EF is mildly lower at 30 to 35% but overall appears well compensated.  Assessment chronic combined congestive heart failure  Plan I will have him continue his carvedilol, his fluid is mostly managed by dialysis with occasional use of furosemide.  I will have him stop Cartia, given his low EF and have him switch back to amlodipine.  Of note he previously had some lower extremity swelling at the 10 mg dose.  Given that he has now on hemodialysis I think would be reasonable to try back amlodipine at the 5 mg dose.  He has been having some ongoing adjustment of his antihypertensive by hemodialysis/nephrology.  Essential (primary) hypertension HPI: He reports to me that he is taking carvedilol 25 mg twice daily as well as hydralazine 100 mg 3 times daily however he skips his blood pressure medications in the morning because he is instructed to do so by hemodialysis on HD days.  He has not yet taken his medications this morning.  He thinks that he is taking [place] but not sure.  Assessment essential hypertension above goal  Plan As noted  above I will change his Cartia to amlodipine at 5 mg dose we will monitor for lower extremity edema. Have him continue carvedilol 25 mg twice daily I will also have him continue hydralazine 100 mg 3 times daily Ongoing adjustment with the assistance of nephrology  Obstructive sleep apnea HPI he has a history of obstructive sleep apnea and has been prescribed CPAP therapy.  He reports intermittent use of CPAP he does not really think he needs it, no fatigue.  Assessment obstructive sleep apnea  Plan I discussed with him that he should continue his CPAP if he does not feel that he needs it then we should pursue a repeat sleep  study to reevaluate this issue.  He will let me know what he would like to do.  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  He has had no hypoglycemic events or hyperglycemic.  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  Continue gabapentin 300 mg daily for diabetic neuropathy. Discussed he needs to follow-up with Dr. Carolynn Sayers for repeat diabetic eye exam.   Medications Ordered Meds ordered this encounter  Medications  . pravastatin (PRAVACHOL) 40 MG tablet    Sig: Take 1 tablet (40 mg total) by mouth daily.    Dispense:  90 tablet    Refill:  3  . amLODipine (NORVASC) 5 MG tablet    Sig: Take 1 tablet (5 mg total) by mouth daily.    Dispense:  90 tablet    Refill:  3   Other Orders Orders Placed This Encounter  Procedures  . Lipid Profile    Standing Status:   Future    Number of Occurrences:   1    Standing Expiration Date:   02/07/2020  . Renal function panel    Standing Status:   Future    Number of Occurrences:   1    Standing Expiration Date:   02/07/2020  . Hemoglobin A1c    Standing Status:   Future    Number of Occurrences:   1    Standing Expiration Date:   02/07/2020   Follow Up: Return in about 4 months (around 06/09/2019).

## 2019-02-07 NOTE — Assessment & Plan Note (Signed)
HPI: He is recently saw his cardiologist Dr. [name].  He has chronic combined congestive heart failure he is well compensated.  He tells me that he was recently denied from [facility] for a kidney transplant he was told this was due to his heart failure.  Dr. Alfonse Spruce has repeated a echocardiogram his EF is mildly lower at 30 to 35% but overall appears well compensated.  Assessment chronic combined congestive heart failure  Plan I will have him continue his carvedilol, his fluid is mostly managed by dialysis with occasional use of furosemide.  I will have him stop Cartia, given his low EF and have him switch back to amlodipine.  Of note he previously had some lower extremity swelling at the 10 mg dose.  Given that he has now on hemodialysis I think would be reasonable to try back amlodipine at the 5 mg dose.  He has been having some ongoing adjustment of his antihypertensive by hemodialysis/nephrology.

## 2019-02-11 ENCOUNTER — Telehealth: Payer: Self-pay | Admitting: *Deleted

## 2019-02-11 LAB — HM DIABETES EYE EXAM

## 2019-02-11 NOTE — Telephone Encounter (Signed)
Thank you :)

## 2019-02-11 NOTE — Telephone Encounter (Signed)
Received faxed lab results from [facility] drawn 02/05/2019. They have given patient blood tube to bring to [facility] to run lipid panel as that is not part of their monthly labs. Lab staff made aware. Results placed in PCP's box for review and signature. Hubbard Hartshorn, RN, BSN

## 2019-03-18 ENCOUNTER — Emergency Department: Payer: 59

## 2019-03-18 ENCOUNTER — Emergency Department
Admission: EM | Admit: 2019-03-18 | Discharge: 2019-03-18 | Disposition: A | Discharge status: Home / Self Care | Payer: 59 | Attending: Emergency Medicine | Admitting: Emergency Medicine

## 2019-03-18 ENCOUNTER — Other Ambulatory Visit: Payer: Self-pay

## 2019-03-18 ENCOUNTER — Encounter: Payer: Self-pay | Admitting: Emergency Medicine

## 2019-03-18 DIAGNOSIS — Z992 Dependence on renal dialysis: Secondary | ICD-10-CM | POA: Diagnosis not present

## 2019-03-18 DIAGNOSIS — E1122 Type 2 diabetes mellitus with diabetic chronic kidney disease: Secondary | ICD-10-CM | POA: Diagnosis not present

## 2019-03-18 DIAGNOSIS — I5042 Chronic combined systolic (congestive) and diastolic (congestive) heart failure: Secondary | ICD-10-CM | POA: Insufficient documentation

## 2019-03-18 DIAGNOSIS — Z79899 Other long term (current) drug therapy: Secondary | ICD-10-CM | POA: Insufficient documentation

## 2019-03-18 DIAGNOSIS — N186 End stage renal disease: Secondary | ICD-10-CM | POA: Insufficient documentation

## 2019-03-18 DIAGNOSIS — M7918 Myalgia, other site: Secondary | ICD-10-CM

## 2019-03-18 DIAGNOSIS — I132 Hypertensive heart and chronic kidney disease with heart failure and with stage 5 chronic kidney disease, or end stage renal disease: Secondary | ICD-10-CM | POA: Insufficient documentation

## 2019-03-18 MED ORDER — OXYCODONE HCL 5 MG PO TABS: 5.0000 mg | ORAL_TABLET | Freq: Three times a day (TID) | ORAL | 0 refills | Status: DC | PRN
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
## 2019-03-18 NOTE — Discharge Instructions (Signed)
Follow-up with your primary care provider for symptoms that are not improving over the week.  Return to the emergency department for symptoms of change or worsen if you are unable to schedule an appointment.

## 2019-03-22 NOTE — Addendum Note (Signed)
Addended by: Orson Gear on: 03/22/2019 11:15 AM   Modules accepted: Orders

## 2019-03-30 IMAGING — MR MR ABDOMEN W/O CM
10 series · 48 of 48 positions shown · non-contrast
Comparison: 10/23/2016

CLINICAL DATA: Left renal mass

EXAM:
MRI ABDOMEN WITHOUT CONTRAST
TECHNIQUE: Multiplanar multisequence MR imaging was performed without the
administration of intravenous contrast.

[Series 5: T2 · coronal · 5.0mm · 1.56mm/px · 2 of 36 slices shown (1 of 3)]
[im 1/36]
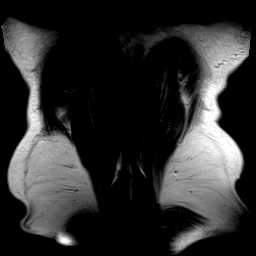
[im 36/36]
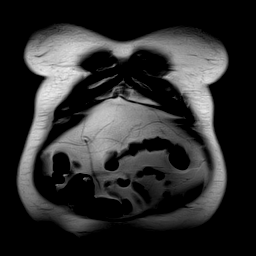

[Series 6: bSSFP · coronal · 5.5mm · 1.39mm/px · 2 of 30 slices shown (1 of 2)]
[im 1/30]
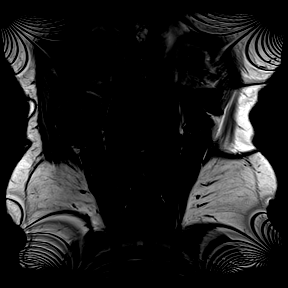
[im 30/30]
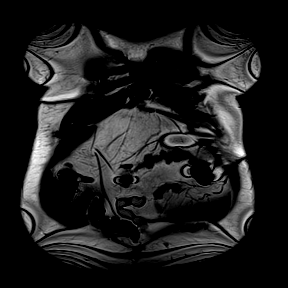

[Series 7: bSSFP · axial · 5.5mm · 1.39mm/px · z∈[-137,+133]mm · 3 of 42 slices shown (2 of 2)]
[im 1/42]
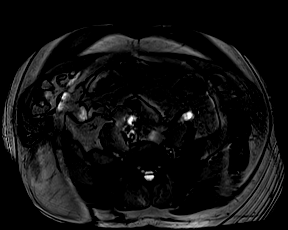
[im 21/42]
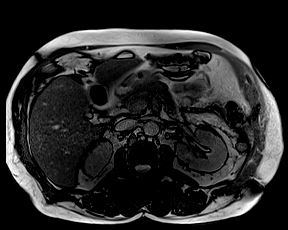
[im 42/42]
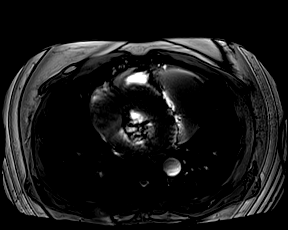

[Series 8: T2 · axial · 6.0mm · 1.22mm/px · z∈[-135,+131]mm · 3 of 38 slices shown (2 of 3)]
[im 1/38]
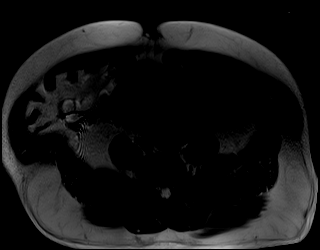
[im 19/38]
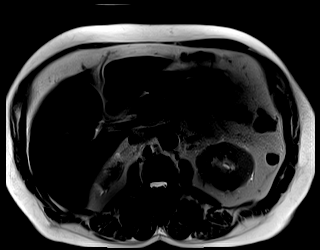
[im 38/38]
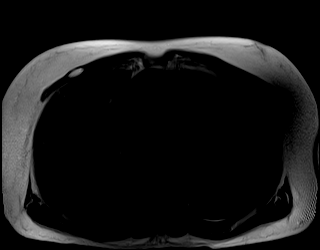

[Series 9: T1 · axial · 3.5mm · 1.19mm/px · z∈[-140,+136]mm · 11 of 160 slices shown]
[im 1/160]
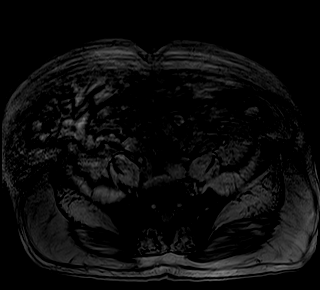
[im 16/160]
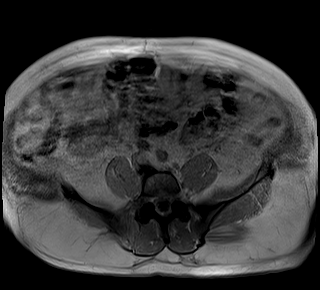
[im 32/160]
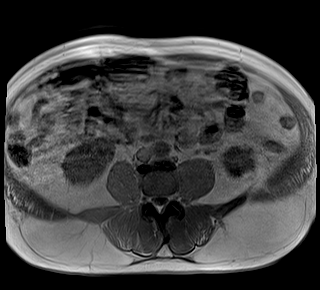
[im 48/160]
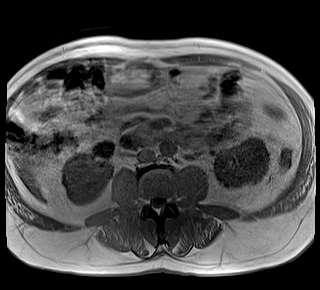
[im 64/160]
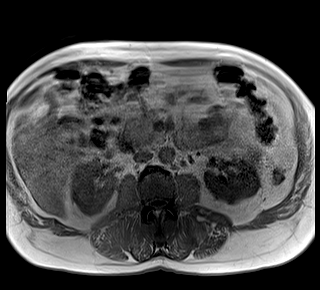
[im 80/160]
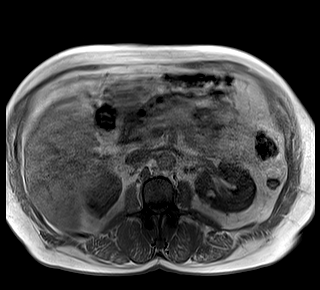
[im 96/160]
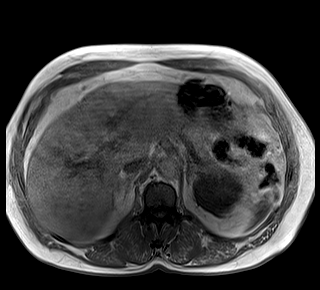
[im 112/160]
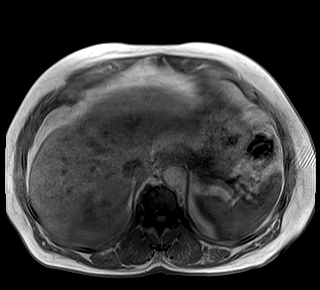
[im 128/160]
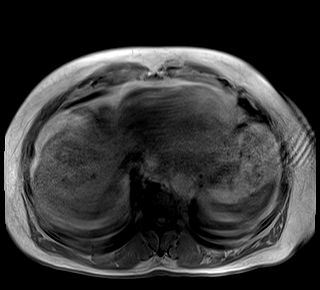
[im 144/160]
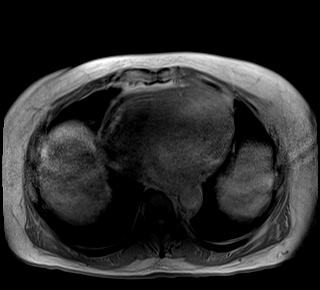
[im 160/160]
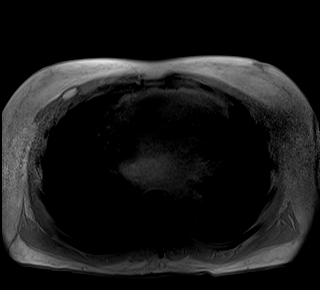

[Series 10: T1 dynamic · coronal · 3.0mm · 1.25mm/px · 5 of 72 slices shown (1 of 2)]
[im 1/72]
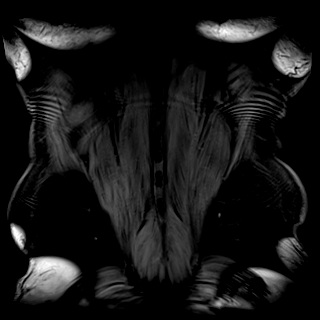
[im 18/72]
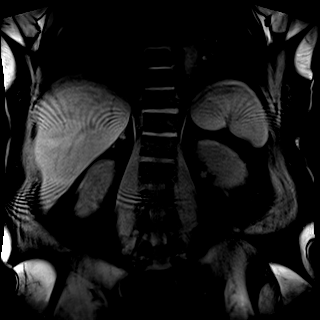
[im 36/72]
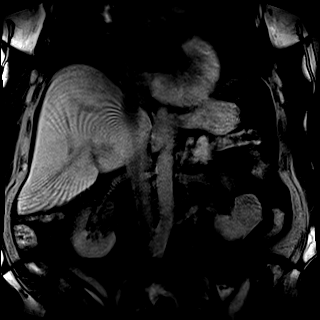
[im 54/72]
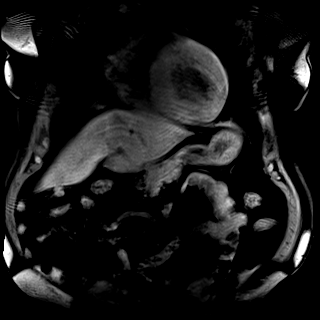
[im 72/72]
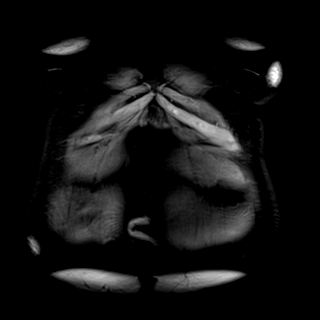

[Series 11: T1 dynamic · axial · non-contrast · 3.0mm · 1.25mm/px · z∈[-145,+140]mm · 7 of 96 slices shown (2 of 2)]
[im 1/96]
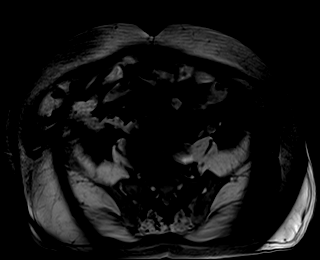
[im 16/96]
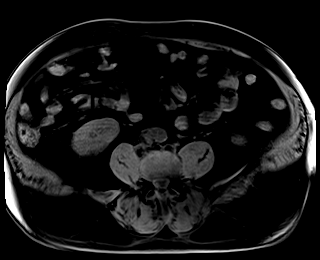
[im 32/96]
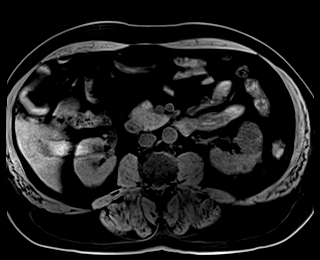
[im 48/96]
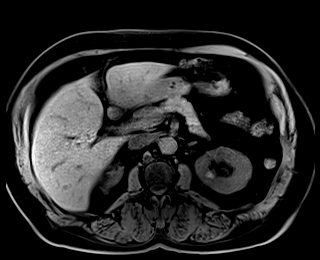
[im 64/96]
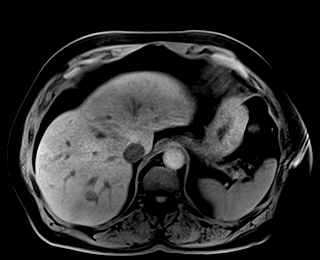
[im 80/96]
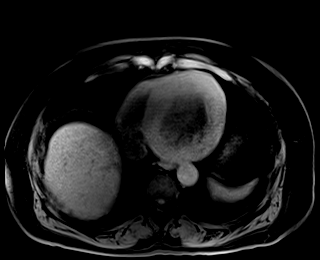
[im 96/96]
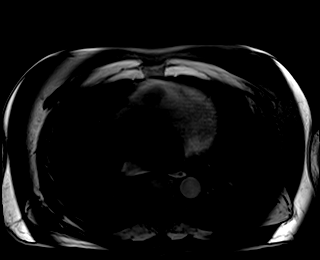

[Series 12: T2 · axial · 5.0mm · 1.48mm/px · z∈[-121,+143]mm · 3 of 45 slices shown (3 of 3)]
[im 1/45]
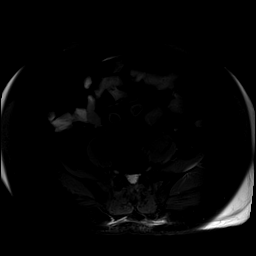
[im 23/45]
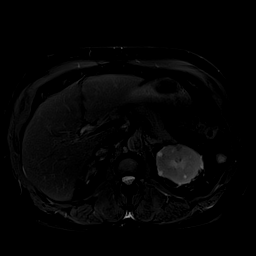
[im 45/45]
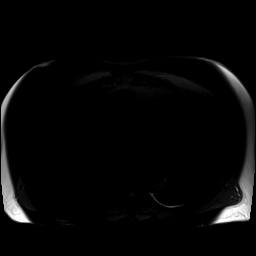

[Series 13: DWI · axial · 5.0mm · 1.42mm/px · z∈[-121,+143]mm · 9 of 135 slices shown (1 of 2)]
[im 1/135]
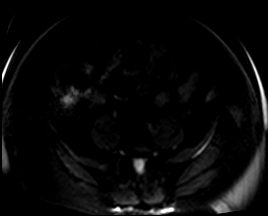
[im 17/135]
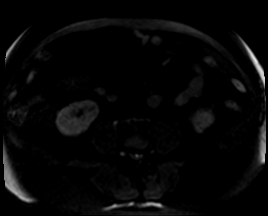
[im 34/135]
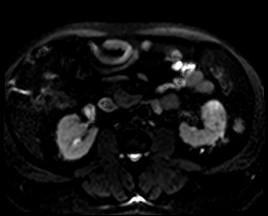
[im 51/135]
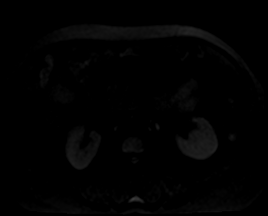
[im 68/135]
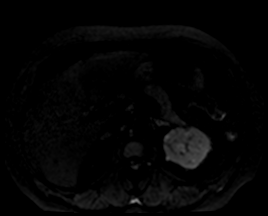
[im 84/135]
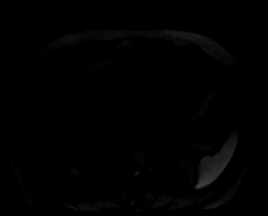
[im 101/135]
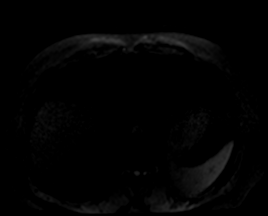
[im 118/135]
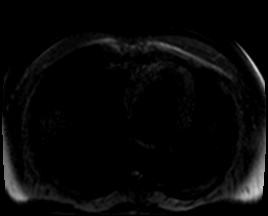
[im 135/135]
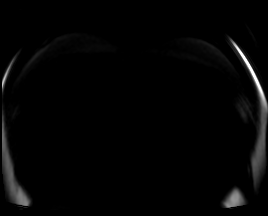

[Series 14: DWI · axial · 5.0mm · 1.42mm/px · z∈[-121,+143]mm · 3 of 45 slices shown (2 of 2)]
[im 1/45]
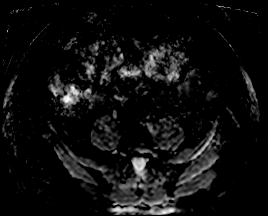
[im 23/45]
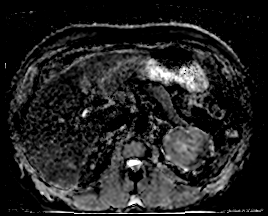
[im 45/45]
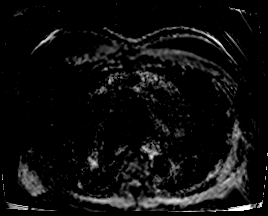

[48 of 48 positions shown; findings below may reference images not displayed]

FINDINGS: Lower chest: Unremarkable.

Hepatobiliary: 17 mm well-defined T1 hypointense, intermediate T2
hyperintense lesion identified posterior right liver as seen on
previous CT. This cannot be definitively characterized on today's
MRI without contrast. Comparing to the CT scan of 10/23/2016 ,
attenuation in this lesion was higher than would be expected for a
simple cyst.

Pancreas: No focal mass lesion. No dilatation of the main duct. No
intraparenchymal cyst. No peripancreatic edema.

Spleen:  No splenomegaly. No focal mass lesion.

Adrenals/Urinary Tract:  No adrenal nodule or mass.

Scattered tiny T2 hyperintensities in the right kidney are likely
cysts. 16 mm T2 hypointense lesion interpolar right kidney is
obscured by motion artifact on the T1 weighted sequences. No right
hydronephrosis.

3.5 x 4.4 cm lesion of heterogeneous intermediate T2 signal is
identified in the anterior aspect of the interpolar left kidney,
corresponding to the area of concern on recent CT scan. This lesion
is largely hypointense on T1 weighted imaging. Scattered tiny
homogeneous T2 hyperintense lesions in the left kidney are
compatible with cyst. Adjacent 11 and 12 mm lesions in the medial
aspect of the interpolar right kidney have variable signal intensity
on T2 weighted imaging but show T1 shortening on the T1 sequences,
suggesting proteinaceous or complex cysts. 2.5 cm homogeneous T1
hypointense, T2 hyperintense lesion lower pole left kidney is
probably a simple cyst.

Stomach/Bowel: Stomach is nondistended. No gastric wall thickening.
No evidence of outlet obstruction. Duodenum is normally positioned
as is the ligament of Treitz. No small bowel or colonic dilatation
within the visualized abdomen.

Vascular/Lymphatic: No abdominal aortic aneurysm. There is no
gastrohepatic or hepatoduodenal ligament lymphadenopathy. No
intraperitoneal or retroperitoneal lymphadenopathy.

Other:  No intraperitoneal free fluid.

Musculoskeletal: No abnormal marrow signal within the visualized
bony anatomy.
IMPRESSION: 1. 3.5 x 4.4 cm lesion interpolar left kidney is not a simple cyst.
Specifically, this is not a Bosniak I or Bosniak II lesion. The
lesion cannot be definitively characterized given the lack of
intravenous contrast on today's study. Given the heterogeneous
signal intensity of this lesion, renal cell carcinoma is a concern.
2. 16 mm T2 hypointense lesion interpolar right kidney cannot be
definitively characterize today. This may be a cyst complicated by
proteinaceous debris or hemorrhage, but follow-up will be required
to ensure stability if imaging with contrast is not possible.
3. Scattered tiny bilateral renal cysts with at least 2 small
lesions in the left kidney suggesting proteinaceous or hemorrhagic
cyst. Follow-up imaging recommended to ensure that these remain
stable. Attention to the 2.5 cm homogeneous probable cyst lower pole
left kidney on those studies recommended.
4. 17 mm lesion posterior right liver with T2 signal intensity not
as intense as typically seen for simple cyst. This cannot be
definitively characterize today but may be a complex cyst or
cavernous hemangioma. In the absence of imaging with contrast ,
follow-up MRI in 3-6 months could be used to ensure stability.

## 2019-04-15 ENCOUNTER — Other Ambulatory Visit: Payer: Self-pay | Admitting: *Deleted

## 2019-04-15 ENCOUNTER — Telehealth: Payer: Self-pay | Admitting: Cardiovascular Disease

## 2019-04-15 NOTE — Telephone Encounter (Signed)
° °  Patient calling to request forms be sent to [facility] to determine his ability to continue driving

## 2019-04-16 MED ORDER — FUROSEMIDE 80 MG PO TABS: 80.0000 mg | ORAL_TABLET | Freq: Two times a day (BID) | ORAL | 0 refills | Status: DC

## 2019-04-16 NOTE — Telephone Encounter (Signed)
I have looked and there are no papers here for the above pt. (not in clinic or front office) I have sent Dr. [name] a staff message asking if he has any or recalls the patient giving him any papers. Awaiting his response.

## 2019-04-16 NOTE — Telephone Encounter (Signed)
Spoke with patient and he confirmed that he did leave the [facility] paperwork at the [place] office during his last visit. Message sent to [place] to follow up on.

## 2019-04-17 NOTE — Telephone Encounter (Signed)
Pt states that he will get another form from [facility] and carry it with him to his appointment on 9/9 @ [facility] office.

## 2019-04-24 ENCOUNTER — Ambulatory Visit (INDEPENDENT_AMBULATORY_CARE_PROVIDER_SITE_OTHER): Payer: 59 | Admitting: Cardiology

## 2019-04-24 ENCOUNTER — Encounter: Payer: Self-pay | Admitting: Cardiology

## 2019-04-24 ENCOUNTER — Other Ambulatory Visit: Payer: Self-pay

## 2019-04-24 VITALS — BP 110/60 | HR 72 | Ht 71.0 in | Wt 198.0 lb

## 2019-04-24 DIAGNOSIS — Z8673 Personal history of transient ischemic attack (TIA), and cerebral infarction without residual deficits: Secondary | ICD-10-CM

## 2019-04-24 DIAGNOSIS — N186 End stage renal disease: Secondary | ICD-10-CM

## 2019-04-24 DIAGNOSIS — Z85528 Personal history of other malignant neoplasm of kidney: Secondary | ICD-10-CM

## 2019-04-24 DIAGNOSIS — I10 Essential (primary) hypertension: Secondary | ICD-10-CM | POA: Diagnosis not present

## 2019-04-24 DIAGNOSIS — E785 Hyperlipidemia, unspecified: Secondary | ICD-10-CM | POA: Diagnosis not present

## 2019-04-24 DIAGNOSIS — D649 Anemia, unspecified: Secondary | ICD-10-CM

## 2019-04-24 DIAGNOSIS — I5042 Chronic combined systolic (congestive) and diastolic (congestive) heart failure: Secondary | ICD-10-CM | POA: Diagnosis not present

## 2019-04-24 DIAGNOSIS — I6523 Occlusion and stenosis of bilateral carotid arteries: Secondary | ICD-10-CM

## 2019-04-24 NOTE — Patient Instructions (Addendum)
Medication Instructions:  Your physician recommends that you continue on your current medications as directed. Please refer to the Current Medication list given to you today.  If you need a refill on your cardiac medications before your next appointment, please call your pharmacy.   Lab work: None   If you have labs (blood work) drawn today and your tests are completely normal, you will receive your results only by: [name] MyChart Message (if you have MyChart) OR . A paper copy in the mail If you have any lab test that is abnormal or we need to change your treatment, we will call you to review the results.  Testing/Procedures: None   Follow-Up: Follow up with Dr. [name] as planned   Any Other Special Instructions Will Be Listed Below (If Applicable).  Lifestyle Modifications to Prevent and Treat Heart Disease -Recommend heart healthy/Mediterranean diet, with whole grains, fruits, vegetables, fish, lean meats, nuts, olive oil and avocado oil.  -Limit salt intake to less than 1500 mg per day.  -Recommend moderate walking, starting slowly with a few minutes and working up to 3-5 times/week for 30-50 minutes each session. Aim for at least 150 minutes.week. Goal should be pace of 3 miles/hours, or walking 1.5 miles in 30 minutes -Recommend avoidance of tobacco products. Avoid excess alcohol. -Keep blood pressure well controlled, ideally less than 130/80.

## 2019-04-24 NOTE — Progress Notes (Signed)
Cardiology Office Note:    Date:  04/24/2019   ID:  COLLEEN DONAHOE, DOB [birth year], MRN [id number]  PCP:  Lucious Groves, DO  Cardiologist:  [name], MD  Referring MD: Lucious Groves, DO   Chief Complaint  Patient presents with  . Congestive Heart Failure    History of Present Illness:    MONTGOMERY FAVOR is a 63 y.o. male with a past medical history significant for poorly controlled hypertension complicated by renal failure and dialysis as well as CVA in 2013.  He is noted to have nonischemic dilated cardiomyopathy with normal cath in 2012 in 2013.  He went through divorce in 2019.  He had renal cell carcinoma found on CT scan in 04/2018 and had left radical nephrectomy with no cardiac complications on 08/65/7846.  He gets hemodialysis on Tuesday/Thursday/Saturday.  They adjust his blood pressure medications for labile blood pressure.  He was turned down at [facility] for renal transplant.  He had indicated that 1 of his children may try to provide a kidney.  Dr. [name] indicated that his cardiomyopathy would need to be considered by the nephrologist.  The patient was last seen in the office on 01/31/2019 by Dr. [name] at which time the patient was functionally doing well.  An echocardiogram was ordered and consideration of the patient's continued interest in pursuing renal transplant.  Echocardiogram on 02/06/2019 was essentially unchanged with continued EF 30-35%.  Pt is here today mainly for filling out DMV paperwork. He had it done by Dr. [name] but it go misplaced by another office.  He denies any chest discomfort, shortness of breath, orthopnea, PND, edema, palpitations, lightheadedness or syncope.  He continues to be stable on current medications.  He still takes Lasix and has some urine output.   Past Medical History:  Diagnosis Date  . Adenomatous colon polyp 07/02/2011   Last colonoscopy May 06, 2011 by Dr. Owens Loffler, who recommended repeat colonoscopy in 5 years.    . Anemia   . Background diabetic retinopathy 04/20/2012   Patient is followed by Dr. Katy Fitch   . Cardiomyopathy    LV function improved from 2004 to 2008.  Historically, moderately dilated LV with EF 30-40% by 2D echo 08/14/2002.  Mild CAD with severe LV dysfunction by cardiac cath 09/2002.  Normal coronary arteries and normal LV function by cardiac cath 09/19/2006.  A 2-D echo on 04/01/2009 showed mild concentric hypertrophy and normal systolic (LVEF  96-29%) and doppler C/W with grade 1 diastolic dysfunction.  . CHF (congestive heart failure) ([place])    LV function improved from 2004 to 2008.  Historically, moderately dilated LV with EF 30-40% by 2D echo 08/14/2002.  Mild CAD with severe LV dysfunction by cardiac cath 09/2002.  Normal coronary arteries and normal LV function by cardiac cath 09/19/2006.  A 2-D echo on 04/01/2009 showed mild concentric hypertrophy and normal systolic (LVEF  52-84%) and doppler C/W with grade 1 diastolic dysfunction..   . Chronic combined systolic and diastolic congestive heart failure ([place]) 05/21/2010   LV function improved from 2004 to 2008.  Historically, moderately dilated LV with EF 30-40% by 2D echo 08/14/2002.  Mild CAD with severe LV dysfunction by cardiac cath 09/2002.  Normal coronary arteries and normal LV function by cardiac cath 09/19/2006.  A 2-D echo on 04/01/2009 showed mild concentric hypertrophy and normal systolic (LVEF  13-24%) and doppler parameters consistent with abnormal left   . CVA (cerebral vascular accident) ([place]) 07/04/2012   MRI of  the brain 07/04/2012 showed an acute infarct in the right basal ganglia involving the anterior putamen, anterior limb internal capsule, and head of the caudate; this measured approximately 2.5 cm in diameter.     . Dermatitis   . Diabetes mellitus    type 2  . DIABETIC PERIPHERAL NEUROPATHY 08/03/2007   Qualifier: Diagnosis of  By: Marinda Elk MD, Sonia Side    . DM neuropathy, painful (HCC)    fingers and right knee  . ESRD (end  stage renal disease) ([place])    Stage 4, on hemodailysis x 4 months as of 05-18-18 [facility], tues thurs sat  . ESRD (end stage renal disease) on dialysis ([place])    "TTS; [facility]" (05/31/2018)  . Hearing loss in right ear   . Hyperlipidemia   . Hypertension   . Hypertensive crisis 07/28/2012  . Hypertensive urgency 08/20/2014  . Myocardial infarction [facility])  many yrs ago  . Nephrotic syndrome 02/18/2013   A 24-hour urine collection 03/04/2013 showed total protein of 5,460 g and creatinine clearance of 80 mL/minute.  Patient was seen by Seward Meth at [place] and a repeat 24-hour urine showed 10,407 mg protein.  Patient underwent kidney biopsy on 05/30/2013; pathology showed advanced diffuse and nodular diabetic nephropathy with vascular changes consistent with long-standing difficult to control hypertension.     . Pneumonia 2014 and 2015  . Renal insufficiency   . Sleep apnea    uses cpap "sometimes per pt"    Past Surgical History:  Procedure Laterality Date  . AV FISTULA PLACEMENT Left 11/24/2017   Procedure: INSERTION OF ARTERIOVENOUS (AV) GORE-TEX GRAFT LEFT LOWER ARM;  Surgeon: Rosetta Posner, MD;  Location: [facility];  Service: Vascular;  Laterality: Left;  . CARDIAC CATHETERIZATION      4 times  . COLONOSCOPY    . FOOT SURGERY    . POLYPECTOMY    . ROBOT ASSISTED LAPAROSCOPIC NEPHRECTOMY Left 05/30/2018   Procedure: XI ROBOTIC ASSISTED LAPAROSCOPIC NEPHRECTOMY;  Surgeon: Alexis Frock, MD;  Location: [facility];  Service: Urology;  Laterality: Left;    Current Medications: Current Meds  Medication Sig  . amLODipine (NORVASC) 5 MG tablet Take 1 tablet (5 mg total) by mouth daily.  . calcium acetate (PHOSLO) 667 MG capsule Take 1,334 mg by mouth 3 (three) times daily with meals.   . carvedilol (COREG) 25 MG tablet Take 1 tablet (25 mg total) by mouth 2 (two) times daily with a meal.  . clopidogrel (PLAVIX) 75 MG tablet Take 75 mg by mouth  daily.  . fluticasone (FLONASE) 50 MCG/ACT nasal spray Place 2 sprays into both nostrils as needed for allergies.  . folic acid-vitamin b complex-vitamin c-selenium-zinc (DIALYVITE) 3 MG TABS tablet Take 1 tablet by mouth daily.  . furosemide (LASIX) 80 MG tablet Take 1 tablet (80 mg total) by mouth 2 (two) times daily.  [name] gabapentin (NEURONTIN) 300 MG capsule Take 1 capsule (300 mg total) by mouth at bedtime for 30 days.  . hydrALAZINE (APRESOLINE) 100 MG tablet Take 1 tablet (100 mg total) by mouth 3 (three) times daily.  [name] oxyCODONE (ROXICODONE) 5 MG immediate release tablet Take 1 tablet (5 mg total) by mouth every 8 (eight) hours as needed for up to 12 doses.  . pravastatin (PRAVACHOL) 40 MG tablet Take 1 tablet (40 mg total) by mouth daily.  . promethazine (PHENERGAN) 25 MG tablet Take 1 tablet (25 mg total) by mouth every 6 (six) hours as needed for nausea  or vomiting.  . sildenafil (VIAGRA) 50 MG tablet Take 1 tablet (50 mg total) by mouth as needed for erectile dysfunction.     Allergies:   Ivp dye [iodinated diagnostic agents], Hydrocodone, Phentermine, Amlodipine, Lisinopril, and Red dye   Social History   Socioeconomic History  . Marital status: Divorced    Spouse name: Not on file  . Number of children: Not on file  . Years of education: Not on file  . Highest education level: Not on file  Occupational History  . Not on file  Social Needs  . Financial resource strain: Not on file  . Food insecurity    Worry: Not on file    Inability: Not on file  . Transportation needs    Medical: Not on file    Non-medical: Not on file  Tobacco Use  . Smoking status: Never Smoker  . Smokeless tobacco: Never Used  Substance and Sexual Activity  . Alcohol use: No    Alcohol/week: 0.0 standard drinks  . Drug use: No  . Sexual activity: Not on file  Lifestyle  . Physical activity    Days per week: Not on file    Minutes per session: Not on file  . Stress: Not on file   Relationships  . Social Herbalist on phone: Not on file    Gets together: Not on file    Attends religious service: Not on file    Active member of club or organization: Not on file    Attends meetings of clubs or organizations: Not on file    Relationship status: Not on file  Other Topics Concern  . Not on file  Social History Narrative  . Not on file     Family History: The patient's family history includes Aneurysm (age of onset: 43) in his father; Colon cancer in his sister. ROS:   Please see the history of present illness.     All other systems reviewed and are negative.  EKGs/Labs/Other Studies Reviewed:    The following studies were reviewed today:  Echocardiogram 02/06/2019 IMPRESSIONS  1. The left ventricle has moderate-severely reduced systolic function, with an ejection fraction of 30-35%. The cavity size was mildly dilated. There is moderate concentric left ventricular hypertrophy. Left ventricular diastolic Doppler parameters are  consistent with impaired relaxation. Indeterminate filling pressures Left ventrical global hypokinesis without regional wall motion abnormalities.  2. The right ventricle has normal systolic function. The cavity was normal. There is no increase in right ventricular wall thickness.  3. The mitral valve is grossly normal. There is mild to moderate mitral annular calcification present.  4. The tricuspid valve is grossly normal.  5. The aortic valve was not well visualized.  6. The aortic root is normal in size and structure.  Echo Study Conclusions 01/2018 - Left ventricle: The cavity size was mildly dilated. Wall thickness was increased in a pattern of mild LVH. Systolic function was moderately reduced. The estimated ejection fraction was in the range of 35% to 40%. Diffuse hypokinesis. There was no evidence of elevated ventricular filling pressure by Doppler parameters. - Aorta: Aortic root dimension: 44 mm (ED). -  Ascending aorta: The ascending aorta was mildly dilated. - Left atrium: The atrium was mildly dilated. - Right ventricle: The cavity size was moderately dilated. Wall thickness was normal. - Right atrium: The atrium was mildly dilated.  Impressions: - EF has mildly improved when compared to prior (25%).   Echo 02/23/17 Study Conclusions  -  Left ventricle: The cavity size was severely dilated. Wall thickness was increased in a pattern of moderate LVH. Systolic function was severely reduced. The estimated ejection fraction was in the range of 25% to 30%. Diffuse hypokinesis. Features are consistent with a pseudonormal left ventricular filling pattern, with concomitant abnormal relaxation and increased filling pressure (grade 2 diastolic dysfunction). Doppler parameters are consistent with high ventricular filling pressure. - Aortic valve: There was trivial regurgitation. - Aortic root: The aortic root was mildly dilated. - Mitral valve: Calcified annulus. There was mild regurgitation. - Left atrium: The atrium was moderately dilated. - Right atrium: The atrium was moderately dilated.  Impressions:  - Severe global reduction in LV systolic function; severe LVE; moderate LVH; moderate diastolic dysfunction with elevated LV filling pressure; mildly dilated aortic root; trace AI; mild MR; biatrial enlargement.  Carotids  07/02/18 :  Plaque no stenosis f/u 2 years   EKG:  EKG is not ordered today.    Recent Labs: 06/05/2018: ALT 9 06/07/2018: Hemoglobin 8.7; Platelets 307 09/20/2018: BUN 89; Creatinine, Ser 9.37; Potassium 3.6; Sodium 136   Recent Lipid Panel    Component Value Date/Time   CHOL 140 09/07/2017 1028   TRIG 107 09/07/2017 1028   HDL 34 (L) 09/07/2017 1028   CHOLHDL 4.1 09/07/2017 1028   CHOLHDL 4.7 11/04/2014 1202   VLDL 38 11/04/2014 1202   LDLCALC 85 09/07/2017 1028   LDLDIRECT 80 07/18/2012 1010    Physical Exam:    VS:   BP 110/60   Pulse 72   Ht 5\' 11"  (1.803 m)   Wt 198 lb (89.8 kg)   SpO2 98%   BMI 27.62 kg/m     Wt Readings from Last 3 Encounters:  04/24/19 198 lb (89.8 kg)  03/18/19 195 lb (88.5 kg)  02/07/19 196 lb 1.6 oz (89 kg)     Physical Exam  Constitutional: He is oriented to person, place, and time. He appears well-developed and well-nourished. No distress.  HENT:  Head: Normocephalic and atraumatic.  Neck: Normal range of motion. Neck supple. No JVD present.  Cardiovascular: Normal rate, regular rhythm, normal heart sounds and intact distal pulses. Exam reveals no gallop and no friction rub.  No murmur heard. Pulmonary/Chest: Effort normal and breath sounds normal. No respiratory distress. He has no wheezes. He has no rales.  Abdominal: Soft. Bowel sounds are normal.  Musculoskeletal: Normal range of motion.        General: No edema.  Neurological: He is alert and oriented to person, place, and time.  Skin: Skin is warm and dry.  Psychiatric: He has a normal mood and affect. His behavior is normal. Judgment and thought content normal.  Vitals reviewed.    ASSESSMENT:    1. Chronic combined systolic and diastolic heart failure ([place])   2. ESRD (end stage renal disease) ([place])   3. Essential hypertension   4. Hyperlipidemia, unspecified hyperlipidemia type   5. History of renal cell carcinoma   6. Normocytic anemia   7. History of stroke   8. Bilateral carotid artery stenosis    PLAN:    In order of problems listed above:  Chronic combined systolic and diastolic heart failure -EF was down to 25-30% in 02/2017.  Patient placed on optimal medical therapy.  EF improved to 35-40% in 01/2018.  Recent echo on 02/06/2019 showed stable cardiac function with EF 30-35% -Patient not on ACE/ARB/Entresto due to renal failure.  He continues on beta-blocker, hydralazine. Also still on lasix 80 mg  BID, still makes some urine. Fluid balance per dialysis. -Patient is currently able to be as  active as he likes.  Appears euvolemic.  ESRD -Patient on hemodialysis Tuesday/Thursday/Saturday. -He was previously turned down for renal transplant at [facility].  Per prior notes patient is hopeful to get a kidney from a family member.  His cardiomyopathy may prohibit this from occurring.  Hypertension -On amlodipine 5 mg, carvedilol 25 mg, hydralazine 100 mg 3 times daily -Patient noted to have some blood pressure lability while on dialysis sessions. -Today blood pressure is very well controlled.  The patient denies any recent very highs or lows.  Hyperlipidemia -On pravastatin 40 mg daily. -Last lipid panel in 08/2017 with LDL 85.  History of renal cell carcinoma -Status post left radical nephrectomy.   Anemia of chronic disease most likely secondary to CKD -No recent hemoglobin levels in epic.  Labs followed by nephrology.  Prior CVA -On chronic Plavix.  Carotid arteries noted with mild plaque, no significant stenosis. -Patient is asking about coming off of Plavix.  I advised him to speak with his neurologist or his primary care provider on this issue.  Carotid artery disease with prior stroke -prior carotid Dopplers in 06/2018 with only mild plaque buildup 1-39% bilateral.  Plan for repeat carotid Dopplers in 2 years, 06/2020.   Medication Adjustments/Labs and Tests Ordered: Current medicines are reviewed at length with the patient today.  Concerns regarding medicines are outlined above. Labs and tests ordered and medication changes are outlined in the patient instructions below:  Patient Instructions  Medication Instructions:  Your physician recommends that you continue on your current medications as directed. Please refer to the Current Medication list given to you today.  If you need a refill on your cardiac medications before your next appointment, please call your pharmacy.   Lab work: None   If you have labs (blood work) drawn today and your tests are completely  normal, you will receive your results only by: [name] MyChart Message (if you have MyChart) OR . A paper copy in the mail If you have any lab test that is abnormal or we need to change your treatment, we will call you to review the results.  Testing/Procedures: None   Follow-Up: Follow up with Dr. [name] as planned   Any Other Special Instructions Will Be Listed Below (If Applicable).  Lifestyle Modifications to Prevent and Treat Heart Disease -Recommend heart healthy/Mediterranean diet, with whole grains, fruits, vegetables, fish, lean meats, nuts, olive oil and avocado oil.  -Limit salt intake to less than 1500 mg per day.  -Recommend moderate walking, starting slowly with a few minutes and working up to 3-5 times/week for 30-50 minutes each session. Aim for at least 150 minutes.week. Goal should be pace of 3 miles/hours, or walking 1.5 miles in 30 minutes -Recommend avoidance of tobacco products. Avoid excess alcohol. -Keep blood pressure well controlled, ideally less than 130/80.      Signed, Daune Perch, NP  04/24/2019 5:41 PM    [facility]

## 2019-05-29 ENCOUNTER — Other Ambulatory Visit: Payer: Self-pay | Admitting: *Deleted

## 2019-05-29 DIAGNOSIS — I10 Essential (primary) hypertension: Secondary | ICD-10-CM

## 2019-05-29 DIAGNOSIS — I5042 Chronic combined systolic (congestive) and diastolic (congestive) heart failure: Secondary | ICD-10-CM

## 2019-05-30 MED ORDER — HYDRALAZINE HCL 100 MG PO TABS: 100.0000 mg | ORAL_TABLET | Freq: Three times a day (TID) | ORAL | 1 refills | Status: DC

## 2019-07-05 ENCOUNTER — Ambulatory Visit (HOSPITAL_COMMUNITY)
Admission: RE | Admit: 2019-07-05 | Discharge: 2019-07-05 | Disposition: A | Discharge status: Home / Self Care | Payer: 59 | Source: Ambulatory Visit | Attending: Urology | Admitting: Urology

## 2019-07-05 ENCOUNTER — Other Ambulatory Visit (HOSPITAL_COMMUNITY): Payer: Self-pay | Admitting: Urology

## 2019-07-05 ENCOUNTER — Other Ambulatory Visit: Payer: Self-pay

## 2019-07-05 DIAGNOSIS — C642 Malignant neoplasm of left kidney, except renal pelvis: Secondary | ICD-10-CM | POA: Diagnosis present

## 2019-07-15 ENCOUNTER — Other Ambulatory Visit: Payer: Self-pay

## 2019-07-16 MED ORDER — FUROSEMIDE 80 MG PO TABS: 80.0000 mg | ORAL_TABLET | Freq: Two times a day (BID) | ORAL | 0 refills | Status: DC

## 2019-08-01 ENCOUNTER — Ambulatory Visit (INDEPENDENT_AMBULATORY_CARE_PROVIDER_SITE_OTHER): Payer: 59 | Admitting: Internal Medicine

## 2019-08-01 ENCOUNTER — Other Ambulatory Visit: Payer: Self-pay

## 2019-08-01 ENCOUNTER — Encounter: Payer: Self-pay | Admitting: Internal Medicine

## 2019-08-01 VITALS — BP 177/73 | HR 68 | Temp 98.1°F | Ht 72.0 in | Wt 218.3 lb

## 2019-08-01 DIAGNOSIS — I5042 Chronic combined systolic (congestive) and diastolic (congestive) heart failure: Secondary | ICD-10-CM | POA: Diagnosis not present

## 2019-08-01 DIAGNOSIS — N186 End stage renal disease: Secondary | ICD-10-CM | POA: Diagnosis not present

## 2019-08-01 DIAGNOSIS — Z992 Dependence on renal dialysis: Secondary | ICD-10-CM | POA: Diagnosis not present

## 2019-08-01 DIAGNOSIS — Z79899 Other long term (current) drug therapy: Secondary | ICD-10-CM

## 2019-08-01 DIAGNOSIS — I700 Atherosclerosis of aorta: Secondary | ICD-10-CM

## 2019-08-01 DIAGNOSIS — I132 Hypertensive heart and chronic kidney disease with heart failure and with stage 5 chronic kidney disease, or end stage renal disease: Secondary | ICD-10-CM

## 2019-08-01 DIAGNOSIS — E1122 Type 2 diabetes mellitus with diabetic chronic kidney disease: Secondary | ICD-10-CM | POA: Diagnosis not present

## 2019-08-01 DIAGNOSIS — I10 Essential (primary) hypertension: Secondary | ICD-10-CM

## 2019-08-01 LAB — POCT GLYCOSYLATED HEMOGLOBIN (HGB A1C): Hemoglobin A1C: 4.8 % (ref 4.0–5.6)

## 2019-08-01 LAB — GLUCOSE, CAPILLARY: Glucose-Capillary: 78 mg/dL (ref 70–99)

## 2019-08-01 MED ORDER — AMLODIPINE BESYLATE 2.5 MG PO TABS: 2.5000 mg | ORAL_TABLET | Freq: Every day | ORAL | 3 refills | Status: DC

## 2019-08-01 NOTE — Assessment & Plan Note (Signed)
Repeat Lipid panel, he reports he is continuing to take pravastatin 40 mg daily

## 2019-08-01 NOTE — Assessment & Plan Note (Signed)
HPI: He reports to me that he is taking carvedilol 3 times a day and hydralazine 100 mg "mostly" 3 times a day.  He stopped taking the amlodipine 5 mg due to side effect of swelling.  He tells me that he has been arguing with his nephrologist because he feels that he is getting too much fluid taken off at dialysis and he feels bad afterwards he thinks that he has gained natural weight.  However he also reports that amlodipine caused some generalized swelling and this is why he stopped.  This is a side effect he previously had and he was changed to [place] especially with his proteinuria.  However for better blood pressure control I changed him back to amlodipine.  I discussed with him the importance of blood pressure control especially if he wants to improve his ejection fraction and try to get on the kidney transplant list which is a goal of his.  Assessment essential hypertension uncontrolled  Plan Continue carvedilol 25 mg twice a day Continue hydralazine 100 mg 3 times a day Restart amlodipine at 2.5 mg daily assess for side effects.

## 2019-08-01 NOTE — Assessment & Plan Note (Signed)
I discussed with him the importance of blood pressure control for his chronic congestive heart failure.  I discussed need to follow recommendations from his nephrologist for volume removal by dialysis and continue his ongoing discussions with

## 2019-08-01 NOTE — Assessment & Plan Note (Signed)
HPI: He does not check his blood sugars he denies any polyuria polydipsia.  He is not on any diabetic medications and his diabetes has been much better controlled and last year or two especially since he has been on hemodialysis.  Assessment controlled type 2 diabetes with ESRD  Plan Recheck A1c continues to be very well controlled.  Continue to monitor on a every 6 month interval. DM foot exam today.

## 2019-08-01 NOTE — Progress Notes (Signed)
  Subjective:  HPI: [name] is a 63 y.o. male who presents for f/u DM, HTN  Please see Assessment and Plan below for the status of his chronic medical problems.  Review of Systems: Review of Systems  Constitutional: Negative for fever and weight loss.  Respiratory: Negative for cough, sputum production and shortness of breath.   Cardiovascular: Negative for chest pain and leg swelling.  Gastrointestinal: Negative for abdominal pain and heartburn.  Musculoskeletal: Negative for myalgias.  Neurological: Negative for dizziness.  Psychiatric/Behavioral: Negative for depression.    Objective:  Physical Exam: Vitals:   08/01/19 0833  BP: (!) 172/66  Pulse: 66  Temp: 98.1 F (36.7 C)  TempSrc: Oral  SpO2: 100%  Weight: 218 lb 4.8 oz (99 kg)  Height: 6' (1.829 m)   Body mass index is 29.61 kg/m. Physical Exam Vitals and nursing note reviewed.  Constitutional:      Appearance: Normal appearance. He is obese.  Cardiovascular:     Rate and Rhythm: Normal rate and regular rhythm.     Pulses: Normal pulses.          Dorsalis pedis pulses are 2+ on the right side and 2+ on the left side.       Posterior tibial pulses are 2+ on the right side and 2+ on the left side.     Heart sounds: Normal heart sounds.  Pulmonary:     Effort: Pulmonary effort is normal.     Breath sounds: Normal breath sounds. No wheezing, rhonchi or rales.  Abdominal:     General: Abdomen is flat. Bowel sounds are normal.     Palpations: Abdomen is soft.  Musculoskeletal:     Right lower leg: No edema.     Left lower leg: No edema.  Neurological:     Mental Status: He is alert.    Assessment & Plan:  See Encounters Tab for problem based charting.  Medications Ordered Meds ordered this encounter  Medications  . amLODipine (NORVASC) 2.5 MG tablet    Sig: Take 1 tablet (2.5 mg total) by mouth daily.    Dispense:  90 tablet    Refill:  3   Other Orders Orders Placed This Encounter    Procedures  . Renal function panel  . Hepatitis C antibody  . Lipid Profile  . Glucose, capillary  . POC Hbg A1C   Follow Up: Return in about 3 months (around 10/30/2019).

## 2019-08-02 LAB — RENAL FUNCTION PANEL
Albumin: 4.4 g/dL (ref 3.8–4.8)
BUN/Creatinine Ratio: 8 — ABNORMAL LOW (ref 10–24)
BUN: 73 mg/dL — ABNORMAL HIGH (ref 8–27)
CO2: 18 mmol/L — ABNORMAL LOW (ref 20–29)
Calcium: 7.8 mg/dL — ABNORMAL LOW (ref 8.6–10.2)
Chloride: 101 mmol/L (ref 96–106)
Creatinine, Ser: 9.6 mg/dL — ABNORMAL HIGH (ref 0.76–1.27)
GFR calc Af Amer: 6 mL/min/{1.73_m2} — ABNORMAL LOW (ref >59)
GFR calc non Af Amer: 5 mL/min/{1.73_m2} — ABNORMAL LOW (ref >59)
Glucose: 80 mg/dL (ref 65–99)
Phosphorus: 6.6 mg/dL — ABNORMAL HIGH (ref 2.8–4.1)
Potassium: 5.2 mmol/L (ref 3.5–5.2)
Sodium: 141 mmol/L (ref 134–144)

## 2019-08-02 LAB — LIPID PANEL
Chol/HDL Ratio: 4.5 ratio (ref 0.0–5.0)
Cholesterol, Total: 148 mg/dL (ref 100–199)
HDL: 33 mg/dL — ABNORMAL LOW (ref >39)
LDL Chol Calc (NIH): 90 mg/dL (ref 0–99)
Triglycerides: 137 mg/dL (ref 0–149)
VLDL Cholesterol Cal: 25 mg/dL (ref 5–40)

## 2019-08-02 LAB — HEPATITIS C ANTIBODY: Hep C Virus Ab: 0.2 s/co ratio (ref 0.0–0.9)

## 2019-08-07 ENCOUNTER — Encounter: Payer: Self-pay | Admitting: Dietician

## 2019-08-19 ENCOUNTER — Encounter: Payer: Self-pay | Admitting: *Deleted

## 2019-10-22 ENCOUNTER — Other Ambulatory Visit: Payer: Self-pay | Admitting: *Deleted

## 2019-10-22 MED ORDER — FUROSEMIDE 80 MG PO TABS: 80.0000 mg | ORAL_TABLET | Freq: Two times a day (BID) | ORAL | 0 refills | Status: DC

## 2019-10-22 NOTE — Telephone Encounter (Signed)
Next appt scheduled 11/14/19 with PCP.

## 2019-11-01 ENCOUNTER — Other Ambulatory Visit: Payer: Self-pay

## 2019-11-01 MED ORDER — SILDENAFIL CITRATE 50 MG PO TABS: 50.0000 mg | ORAL_TABLET | ORAL | 2 refills | Status: DC | PRN

## 2019-11-01 NOTE — Telephone Encounter (Signed)
sildenafil (VIAGRA) 50 MG tablet(Expired), refill request @  [id number] - [place], [place] [phone] (Phone) [phone] (Fax)

## 2019-11-14 ENCOUNTER — Ambulatory Visit (INDEPENDENT_AMBULATORY_CARE_PROVIDER_SITE_OTHER): Payer: 59 | Admitting: Internal Medicine

## 2019-11-14 DIAGNOSIS — N529 Male erectile dysfunction, unspecified: Secondary | ICD-10-CM | POA: Diagnosis not present

## 2019-11-14 DIAGNOSIS — I10 Essential (primary) hypertension: Secondary | ICD-10-CM

## 2019-11-14 DIAGNOSIS — M1712 Unilateral primary osteoarthritis, left knee: Secondary | ICD-10-CM | POA: Diagnosis not present

## 2019-11-14 NOTE — Progress Notes (Signed)
[facility] Internal Medicine Residency Telephone Encounter Continuity Care Appointment  HPI:   This telephone encounter was created for [name] on 11/14/2019 for the following purpose/cc f/u ED, disability placard.   Past Medical History:  Past Medical History:  Diagnosis Date  . Adenomatous colon polyp 07/02/2011   Last colonoscopy May 06, 2011 by Dr. Owens Loffler, who recommended repeat colonoscopy in 5 years.   . Anemia   . Background diabetic retinopathy 04/20/2012   Patient is followed by Dr. Katy Fitch   . Cardiomyopathy    LV function improved from 2004 to 2008.  Historically, moderately dilated LV with EF 30-40% by 2D echo 08/14/2002.  Mild CAD with severe LV dysfunction by cardiac cath 09/2002.  Normal coronary arteries and normal LV function by cardiac cath 09/19/2006.  A 2-D echo on 04/01/2009 showed mild concentric hypertrophy and normal systolic (LVEF  01-09%) and doppler C/W with grade 1 diastolic dysfunction.  . CHF (congestive heart failure) ([place])    LV function improved from 2004 to 2008.  Historically, moderately dilated LV with EF 30-40% by 2D echo 08/14/2002.  Mild CAD with severe LV dysfunction by cardiac cath 09/2002.  Normal coronary arteries and normal LV function by cardiac cath 09/19/2006.  A 2-D echo on 04/01/2009 showed mild concentric hypertrophy and normal systolic (LVEF  32-35%) and doppler C/W with grade 1 diastolic dysfunction..   . Chronic combined systolic and diastolic congestive heart failure ([place]) 05/21/2010   LV function improved from 2004 to 2008.  Historically, moderately dilated LV with EF 30-40% by 2D echo 08/14/2002.  Mild CAD with severe LV dysfunction by cardiac cath 09/2002.  Normal coronary arteries and normal LV function by cardiac cath 09/19/2006.  A 2-D echo on 04/01/2009 showed mild concentric hypertrophy and normal systolic (LVEF  57-32%) and doppler parameters consistent with abnormal left   . CVA (cerebral vascular accident) ([place])  07/04/2012   MRI of the brain 07/04/2012 showed an acute infarct in the right basal ganglia involving the anterior putamen, anterior limb internal capsule, and head of the caudate; this measured approximately 2.5 cm in diameter.     . Dermatitis   . Diabetes mellitus    type 2  . DIABETIC PERIPHERAL NEUROPATHY 08/03/2007   Qualifier: Diagnosis of  By: Marinda Elk MD, Sonia Side    . DM neuropathy, painful (HCC)    fingers and right knee  . ESRD (end stage renal disease) ([place])    Stage 4, on hemodailysis x 4 months as of 05-18-18 [facility], tues thurs sat  . ESRD (end stage renal disease) on dialysis ([place])    "TTS; [facility]" (05/31/2018)  . Hearing loss in right ear   . Hyperlipidemia   . Hypertension   . Hypertensive crisis 07/28/2012  . Hypertensive urgency 08/20/2014  . Myocardial infarction [facility])  many yrs ago  . Nephrotic syndrome 02/18/2013   A 24-hour urine collection 03/04/2013 showed total protein of 5,460 g and creatinine clearance of 80 mL/minute.  Patient was seen by Seward Meth at [place] and a repeat 24-hour urine showed 10,407 mg protein.  Patient underwent kidney biopsy on 05/30/2013; pathology showed advanced diffuse and nodular diabetic nephropathy with vascular changes consistent with long-standing difficult to control hypertension.     . Pneumonia 2014 and 2015  . Renal insufficiency   . Sleep apnea    uses cpap "sometimes per pt"      ROS:      Assessment / Plan /  Recommendations:   Please see A&P under problem oriented charting for assessment of the patient's acute and chronic medical conditions.   As always, pt is advised that if symptoms worsen or new symptoms arise, they should go to an urgent care facility or to to ER for further evaluation.   Consent and Medical Decision Making:   Patient was scheduled for a visit this morning however apparently was called to going to dialysis early which she did he still wanted to  conduct a visit with me and requested front office staff convert to a telehealth visit.  This is a telephone encounter between Charles Hart and Lucious Groves on 11/14/2019 for ED. The visit was conducted with the patient located at home and Lucious Groves at [facility]. The patient's identity was confirmed using their DOB and current address. The patient has consented to being evaluated through a telephone encounter and understands the associated risks (an examination cannot be done and the patient may need to come in for an appointment) / benefits (allows the patient to remain at home, decreasing exposure to coronavirus). I personally spent 8 minutes on medical discussion.     Patient also requesting Handicap parking renewal.  Discussed I could complete paperwork he will need to pick it up next week.

## 2019-11-14 NOTE — Assessment & Plan Note (Signed)
We discussed his blood pressure had been elevated at recent visits I cannot check his blood pressure daily since he is not in office.  He reports to me that they checked at hemodialysis and that it was "perfect."  I discussed he should continue following up with nephrology, and reminded about the portance of continuing blood pressure control.

## 2019-11-14 NOTE — Assessment & Plan Note (Signed)
HPI: He is requesting refill of sildenafil for erectile dysfunction.  Reports this is helpful in the past.  He is no longer nitrates and understands these cannot be combined with sildenafil.  Assessment erectile dysfunction  Plan Refilled sildenafil 50 mg as needed

## 2019-11-14 NOTE — Assessment & Plan Note (Signed)
He is requesting a disability placard this would I discussed I will fill this out and have it ready for him to pick up next week.

## 2020-01-15 ENCOUNTER — Telehealth: Payer: Self-pay | Admitting: Internal Medicine

## 2020-01-15 NOTE — Telephone Encounter (Signed)
Called and spoke with Purcell Nails, he was told by [facility] transplant he will need to have a visit with me, a colonoscopy, and EKG and stress test before proceeding further in transplant evaluation.    I discussed that he should make an appointment with Dr [name] for EKG and stress testing.    We can have the front desk schedule a visit with me to address other issues.

## 2020-01-15 NOTE — Telephone Encounter (Signed)
Pt is requesting that Dr Heber Shipman contact him regarding his transplant [phone]

## 2020-01-17 NOTE — Progress Notes (Deleted)
Cardiology Office Note:    Date:  01/17/2020   ID:  Charles Hart, DOB May 11, 1956, MRN [id number]  PCP:  Lucious Groves, DO  Cardiologist:  [name], MD  Referring MD: Lucious Groves, DO   No chief complaint on file.   History of Present Illness:    Charles Hart is a 64 y.o. male with a past medical history significant for poorly controlled hypertension complicated by renal failure and dialysis as well as CVA in 2013.  He is noted to have nonischemic dilated cardiomyopathy with normal cath in 2012 in 2013.  He went through divorce in 2019.  He had renal cell carcinoma found on CT scan in 04/2018 and had left radical nephrectomy with no cardiac complications on 97/41/6384.  He gets hemodialysis on Tuesday/Thursday/Saturday.  They adjust his blood pressure medications for labile blood pressure.  He was turned down at [facility] for renal transplant.      Echocardiogram on 02/06/2019 was essentially unchanged with continued EF 30-35%.  He denies any chest discomfort, shortness of breath, orthopnea, PND, edema, palpitations, lightheadedness or syncope.  He continues to be stable on current medications.  He still takes Lasix and has some urine output.  Reviewed note from Dr Albertine Patricia Renal He indicated wanting stress echo for renal transplant possibility being evaluated at [facility] now   ***   Past Medical History:  Diagnosis Date  . Adenomatous colon polyp 07/02/2011   Last colonoscopy May 06, 2011 by Dr. Owens Loffler, who recommended repeat colonoscopy in 5 years.   . Anemia   . Background diabetic retinopathy 04/20/2012   Patient is followed by Dr. Katy Fitch   . Cardiomyopathy    LV function improved from 2004 to 2008.  Historically, moderately dilated LV with EF 30-40% by 2D echo 08/14/2002.  Mild CAD with severe LV dysfunction by cardiac cath 09/2002.  Normal coronary arteries and normal LV function by cardiac cath 09/19/2006.  A 2-D echo on 04/01/2009 showed mild concentric  hypertrophy and normal systolic (LVEF  53-64%) and doppler C/W with grade 1 diastolic dysfunction.  . CHF (congestive heart failure) ([place])    LV function improved from 2004 to 2008.  Historically, moderately dilated LV with EF 30-40% by 2D echo 08/14/2002.  Mild CAD with severe LV dysfunction by cardiac cath 09/2002.  Normal coronary arteries and normal LV function by cardiac cath 09/19/2006.  A 2-D echo on 04/01/2009 showed mild concentric hypertrophy and normal systolic (LVEF  68-03%) and doppler C/W with grade 1 diastolic dysfunction..   . Chronic combined systolic and diastolic congestive heart failure ([place]) 05/21/2010   LV function improved from 2004 to 2008.  Historically, moderately dilated LV with EF 30-40% by 2D echo 08/14/2002.  Mild CAD with severe LV dysfunction by cardiac cath 09/2002.  Normal coronary arteries and normal LV function by cardiac cath 09/19/2006.  A 2-D echo on 04/01/2009 showed mild concentric hypertrophy and normal systolic (LVEF  21-22%) and doppler parameters consistent with abnormal left   . CVA (cerebral vascular accident) ([place]) 07/04/2012   MRI of the brain 07/04/2012 showed an acute infarct in the right basal ganglia involving the anterior putamen, anterior limb internal capsule, and head of the caudate; this measured approximately 2.5 cm in diameter.     . Dermatitis   . Diabetes mellitus    type 2  . DIABETIC PERIPHERAL NEUROPATHY 08/03/2007   Qualifier: Diagnosis of  By: Marinda Elk MD, Sonia Side    . DM neuropathy, painful ([place])  fingers and right knee  . ESRD (end stage renal disease) ([place])    Stage 4, on hemodailysis x 4 months as of 05-18-18 [facility], tues thurs sat  . ESRD (end stage renal disease) on dialysis ([place])    "TTS; [facility]" (05/31/2018)  . Hearing loss in right ear   . Hyperlipidemia   . Hypertension   . Hypertensive crisis 07/28/2012  . Hypertensive urgency 08/20/2014  . Myocardial infarction [facility])  many yrs ago  . Nephrotic syndrome  02/18/2013   A 24-hour urine collection 03/04/2013 showed total protein of 5,460 g and creatinine clearance of 80 mL/minute.  Patient was seen by Seward Meth at [place] and a repeat 24-hour urine showed 10,407 mg protein.  Patient underwent kidney biopsy on 05/30/2013; pathology showed advanced diffuse and nodular diabetic nephropathy with vascular changes consistent with long-standing difficult to control hypertension.     . Pneumonia 2014 and 2015  . Renal insufficiency   . Sleep apnea    uses cpap "sometimes per pt"    Past Surgical History:  Procedure Laterality Date  . AV FISTULA PLACEMENT Left 11/24/2017   Procedure: INSERTION OF ARTERIOVENOUS (AV) GORE-TEX GRAFT LEFT LOWER ARM;  Surgeon: Rosetta Posner, MD;  Location: [facility];  Service: Vascular;  Laterality: Left;  . CARDIAC CATHETERIZATION      4 times  . COLONOSCOPY    . FOOT SURGERY    . POLYPECTOMY    . ROBOT ASSISTED LAPAROSCOPIC NEPHRECTOMY Left 05/30/2018   Procedure: XI ROBOTIC ASSISTED LAPAROSCOPIC NEPHRECTOMY;  Surgeon: Alexis Frock, MD;  Location: [facility];  Service: Urology;  Laterality: Left;    Current Medications: No outpatient medications have been marked as taking for the 01/24/20 encounter (Appointment) with Josue Hector, MD.     Allergies:   Ivp dye [iodinated diagnostic agents], Hydrocodone, Phentermine, Amlodipine, Lisinopril, and Red dye   Social History   Socioeconomic History  . Marital status: Divorced    Spouse name: Not on file  . Number of children: Not on file  . Years of education: Not on file  . Highest education level: Not on file  Occupational History  . Not on file  Tobacco Use  . Smoking status: Never Smoker  . Smokeless tobacco: Never Used  Substance and Sexual Activity  . Alcohol use: No    Alcohol/week: 0.0 standard drinks  . Drug use: No  . Sexual activity: Not on file  Other Topics Concern  . Not on file  Social History Narrative  . Not on  file   Social Determinants of Health   Financial Resource Strain:   . Difficulty of Paying Living Expenses:   Food Insecurity:   . Worried About Charity fundraiser in the Last Year:   . Arboriculturist in the Last Year:   Transportation Needs:   . Film/video editor (Medical):   [name] Lack of Transportation (Non-Medical):   Physical Activity:   . Days of Exercise per Week:   . Minutes of Exercise per Session:   Stress:   . Feeling of Stress :   Social Connections:   . Frequency of Communication with Friends and Family:   . Frequency of Social Gatherings with Friends and Family:   . Attends Religious Services:   . Active Member of Clubs or Organizations:   . Attends Archivist Meetings:   [name] Marital Status:      Family History: The patient's family history includes  Aneurysm (age of onset: 35) in his father; Colon cancer in his sister. ROS:   Please see the history of present illness.     All other systems reviewed and are negative.  EKGs/Labs/Other Studies Reviewed:    The following studies were reviewed today:  Echocardiogram 02/06/2019 IMPRESSIONS  1. The left ventricle has moderate-severely reduced systolic function, with an ejection fraction of 30-35%. The cavity size was mildly dilated. There is moderate concentric left ventricular hypertrophy. Left ventricular diastolic Doppler parameters are  consistent with impaired relaxation. Indeterminate filling pressures Left ventrical global hypokinesis without regional wall motion abnormalities.  2. The right ventricle has normal systolic function. The cavity was normal. There is no increase in right ventricular wall thickness.  3. The mitral valve is grossly normal. There is mild to moderate mitral annular calcification present.  4. The tricuspid valve is grossly normal.  5. The aortic valve was not well visualized.  6. The aortic root is normal in size and structure.  Echo Study Conclusions 01/2018 - Left  ventricle: The cavity size was mildly dilated. Wall thickness was increased in a pattern of mild LVH. Systolic function was moderately reduced. The estimated ejection fraction was in the range of 35% to 40%. Diffuse hypokinesis. There was no evidence of elevated ventricular filling pressure by Doppler parameters. - Aorta: Aortic root dimension: 44 mm (ED). - Ascending aorta: The ascending aorta was mildly dilated. - Left atrium: The atrium was mildly dilated. - Right ventricle: The cavity size was moderately dilated. Wall thickness was normal. - Right atrium: The atrium was mildly dilated.  Impressions: - EF has mildly improved when compared to prior (25%).   Echo 02/23/17 Study Conclusions  - Left ventricle: The cavity size was severely dilated. Wall thickness was increased in a pattern of moderate LVH. Systolic function was severely reduced. The estimated ejection fraction was in the range of 25% to 30%. Diffuse hypokinesis. Features are consistent with a pseudonormal left ventricular filling pattern, with concomitant abnormal relaxation and increased filling pressure (grade 2 diastolic dysfunction). Doppler parameters are consistent with high ventricular filling pressure. - Aortic valve: There was trivial regurgitation. - Aortic root: The aortic root was mildly dilated. - Mitral valve: Calcified annulus. There was mild regurgitation. - Left atrium: The atrium was moderately dilated. - Right atrium: The atrium was moderately dilated.  Impressions:  - Severe global reduction in LV systolic function; severe LVE; moderate LVH; moderate diastolic dysfunction with elevated LV filling pressure; mildly dilated aortic root; trace AI; mild MR; biatrial enlargement.  Carotids  07/02/18 :  Plaque no stenosis f/u 2 years   EKG:  EKG is not ordered today.    Recent Labs: 08/01/2019: BUN 73; Creatinine, Ser 9.60; Potassium 5.2; Sodium 141    Recent Lipid Panel    Component Value Date/Time   CHOL 148 08/01/2019 0844   TRIG 137 08/01/2019 0844   HDL 33 (L) 08/01/2019 0844   CHOLHDL 4.5 08/01/2019 0844   CHOLHDL 4.7 11/04/2014 1202   VLDL 38 11/04/2014 1202   LDLCALC 90 08/01/2019 0844   LDLDIRECT 80 07/18/2012 1010    Physical Exam:    VS:  There were no vitals taken for this visit.    Wt Readings from Last 3 Encounters:  08/01/19 218 lb 4.8 oz (99 kg)  04/24/19 198 lb (89.8 kg)  03/18/19 195 lb (88.5 kg)     Affect appropriate Chronically ill black male  HEENT: normal Neck supple with no adenopathy JVP normal no bruits  no thyromegaly Lungs clear with no wheezing and good diaphragmatic motion Heart:  S1/S2 no murmur, no rub, gallop or click PMI enlarged  Abdomen: benighn, BS positve, no tenderness, no AAA no bruit.  No HSM or HJR Distal pulses intact with no bruits No edema Neuro non-focal Skin warm and dry No muscular weakness Gortex fistula in left lower arm with good thrill    ASSESSMENT:    No diagnosis found. PLAN:    In order of problems listed above:  Chronic combined systolic and diastolic heart failure -EF was down to 25-30% in 02/2017.  Patient placed on optimal medical therapy.  EF improved to 35-40% in 01/2018.  Recent echo on 02/06/2019 showed stable cardiac function with EF 30-35% -Patient not on ACE/ARB/Entresto due to renal failure.  He continues on beta-blocker, hydralazine. Also still on lasix 80 mg BID, still makes some urine. Fluid balance per dialysis. -Patient is currently able to be as active as he likes.  Appears euvolemic.  ESRD -Patient on hemodialysis Tuesday/Thursday/Saturday. -He was previously turned down for renal transplant at [facility].  Per prior notes patient is hopeful to get a kidney from a family member.  His cardiomyopathy may prohibit this from occurring.  Hypertension -On amlodipine 5 mg, carvedilol 25 mg, hydralazine 100 mg 3 times daily -Patient noted to  have some blood pressure lability while on dialysis sessions. -Today blood pressure is very well controlled.  The patient denies any recent very highs or lows.  Hyperlipidemia -On pravastatin 40 mg daily.  History of renal cell carcinoma -Status post left radical nephrectomy.   Anemia of chronic disease most likely secondary to CKD -No recent hemoglobin levels in epic.  Labs followed by nephrology.  Prior CVA -On chronic Plavix.  Carotid arteries noted with mild plaque, no significant stenosis. -Patient is asking about coming off of Plavix.  I advised him to speak with his neurologist or his primary care provider on this issue.  Carotid artery disease with prior stroke -prior carotid Dopplers in 06/2018 with only mild plaque buildup 1-39% bilateral.  Plan for repeat carotid Dopplers in 2 years, 06/2020.   Carotid Duplex November 2021 f/u plaque Echo for DCM ordered f/u from June 2020  Stress Echo ordered for transplant evaluation   F/U in 6 months    Signed, [name], MD  01/17/2020 6:14 PM    [facility]

## 2020-01-24 ENCOUNTER — Ambulatory Visit: Payer: 59 | Admitting: Cardiovascular Disease

## 2020-01-27 ENCOUNTER — Other Ambulatory Visit: Payer: Self-pay | Admitting: *Deleted

## 2020-01-28 MED ORDER — FUROSEMIDE 80 MG PO TABS: 80.0000 mg | ORAL_TABLET | Freq: Two times a day (BID) | ORAL | 1 refills | Status: DC

## 2020-01-28 MED ORDER — PRAVASTATIN SODIUM 40 MG PO TABS: 40.0000 mg | ORAL_TABLET | Freq: Every day | ORAL | 1 refills | Status: DC

## 2020-02-12 ENCOUNTER — Ambulatory Visit: Payer: Medicare Other | Admitting: Nurse Practitioner

## 2020-02-20 ENCOUNTER — Telehealth: Payer: Self-pay | Admitting: *Deleted

## 2020-02-20 ENCOUNTER — Encounter: Payer: Medicare Other | Admitting: Internal Medicine

## 2020-02-20 NOTE — Telephone Encounter (Signed)
Pt did not come to his appt this morning. Called pt to re-schedule - no answer;unable to leave a message "call cannot be completed".

## 2020-05-01 ENCOUNTER — Encounter: Payer: Self-pay | Admitting: Nurse Practitioner

## 2020-05-01 NOTE — Progress Notes (Signed)
Cardiology Office Note:    Date:  05/13/2020   ID:  Charles Hart, DOB 1956-01-15, MRN [id number]  PCP:  Charles Groves, DO  Cardiologist:  Charles Rouge, MD  Referring MD: Charles Groves, DO   No chief complaint on file.   History of Present Illness:    Charles Hart is a 64 y.o. male with a past medical history significant for poorly controlled hypertension complicated by renal failure and dialysis as well as CVA in 2013.  He is noted to have nonischemic dilated cardiomyopathy with normal cath in 2012 in 2013.  He went through divorce in 2019.  He had renal cell carcinoma found on CT scan in 04/2018 and had left radical nephrectomy with no cardiac complications on 77/93/9030.  He gets hemodialysis on Tuesday/Thursday/Saturday.  They adjust his blood pressure medications for labile blood pressure.  He was turned down at [name] for renal transplant.  He had indicated that 1 of his children may try to provide a kidney. However I suspect with his age and DCM he would not be a canddiate   Echocardiogram on 02/06/2019 was essentially unchanged with continued EF 30-35%.  Not totally anuric and takes lasix Not on ARB/ACE/Entresto due to renal failure  Has a large ventral hernia from previous nephrectomy May need surgery for this     Past Medical History:  Diagnosis Date  . Adenomatous colon polyp 07/02/2011   Last colonoscopy May 06, 2011 by Dr. Owens Loffler, who recommended repeat colonoscopy in 5 years.   . Anemia   . Background diabetic retinopathy 04/20/2012   Patient is followed by Dr. Katy Hart   . Cardiomyopathy    LV function improved from 2004 to 2008.  Historically, moderately dilated LV with EF 30-40% by 2D echo 08/14/2002.  Mild CAD with severe LV dysfunction by cardiac cath 09/2002.  Normal coronary arteries and normal LV function by cardiac cath 09/19/2006.  A 2-D echo on 04/01/2009 showed mild concentric hypertrophy and normal systolic (LVEF  09-23%) and doppler C/W  with grade 1 diastolic dysfunction.  . CHF (congestive heart failure) ([place])    LV function improved from 2004 to 2008.  Historically, moderately dilated LV with EF 30-40% by 2D echo 08/14/2002.  Mild CAD with severe LV dysfunction by cardiac cath 09/2002.  Normal coronary arteries and normal LV function by cardiac cath 09/19/2006.  A 2-D echo on 04/01/2009 showed mild concentric hypertrophy and normal systolic (LVEF  30-07%) and doppler C/W with grade 1 diastolic dysfunction..   . Chronic combined systolic and diastolic congestive heart failure ([place]) 05/21/2010   LV function improved from 2004 to 2008.  Historically, moderately dilated LV with EF 30-40% by 2D echo 08/14/2002.  Mild CAD with severe LV dysfunction by cardiac cath 09/2002.  Normal coronary arteries and normal LV function by cardiac cath 09/19/2006.  A 2-D echo on 04/01/2009 showed mild concentric hypertrophy and normal systolic (LVEF  62-26%) and doppler parameters consistent with abnormal left   . CVA (cerebral vascular accident) ([place]) 07/04/2012   MRI of the brain 07/04/2012 showed an acute infarct in the right basal ganglia involving the anterior putamen, anterior limb internal capsule, and head of the caudate; this measured approximately 2.5 cm in diameter.     . Dermatitis   . Diabetes mellitus    type 2  . DIABETIC PERIPHERAL NEUROPATHY 08/03/2007   Qualifier: Diagnosis of  By: Charles Elk MD, Charles Hart    . DM neuropathy, painful (HCC)    fingers  and right knee  . ESRD (end stage renal disease) (Charles Hart)    Stage 4, on hemodailysis x 4 months as of 05-18-18 [facility], tues thurs sat  . ESRD (end stage renal disease) on dialysis ([place])    "TTS; [facility]" (05/31/2018)  . Hearing loss in right ear   . Hyperlipidemia   . Hypertension   . Hypertensive crisis 07/28/2012  . Hypertensive urgency 08/20/2014  . Myocardial infarction [facility])  many yrs ago  . Nephrotic syndrome 02/18/2013   A 24-hour urine collection 03/04/2013 showed total protein  of 5,460 g and creatinine clearance of 80 mL/minute.  Patient was seen by Seward Meth at [place] and a repeat 24-hour urine showed 10,407 mg protein.  Patient underwent kidney biopsy on 05/30/2013; pathology showed advanced diffuse and nodular diabetic nephropathy with vascular changes consistent with long-standing difficult to control hypertension.     . Pneumonia 2014 and 2015  . Renal insufficiency   . Sleep apnea    uses cpap "sometimes per pt"    Past Surgical History:  Procedure Laterality Date  . AV FISTULA PLACEMENT Left 11/24/2017   Procedure: INSERTION OF ARTERIOVENOUS (AV) GORE-TEX GRAFT LEFT LOWER ARM;  Surgeon: Rosetta Posner, MD;  Location: [facility];  Service: Vascular;  Laterality: Left;  . CARDIAC CATHETERIZATION      4 times  . COLONOSCOPY    . FOOT SURGERY    . POLYPECTOMY    . ROBOT ASSISTED LAPAROSCOPIC NEPHRECTOMY Left 05/30/2018   Procedure: XI ROBOTIC ASSISTED LAPAROSCOPIC NEPHRECTOMY;  Surgeon: Alexis Frock, MD;  Location: [facility];  Service: Urology;  Laterality: Left;    Current Medications: Current Meds  Medication Sig  . amLODipine (NORVASC) 2.5 MG tablet Take 1 tablet (2.5 mg total) by mouth daily.  . calcium acetate (PHOSLO) 667 MG capsule Take 1,334 mg by mouth 3 (three) times daily with meals.   . carvedilol (COREG) 25 MG tablet Take 1 tablet (25 mg total) by mouth 2 (two) times daily with a meal.  . clopidogrel (PLAVIX) 75 MG tablet Take 75 mg by mouth daily.  . fluticasone (FLONASE) 50 MCG/ACT nasal spray Place 2 sprays into both nostrils as needed for allergies.  . folic acid-vitamin b complex-vitamin c-selenium-zinc (DIALYVITE) 3 MG TABS tablet Take 1 tablet by mouth daily.  . furosemide (LASIX) 80 MG tablet Take 1 tablet (80 mg total) by mouth 2 (two) times daily.  . hydrALAZINE (APRESOLINE) 100 MG tablet Take 1 tablet (100 mg total) by mouth 3 (three) times daily.  . pravastatin (PRAVACHOL) 40 MG tablet Take 1 tablet  (40 mg total) by mouth daily.  . sildenafil (VIAGRA) 50 MG tablet Take 1 tablet (50 mg total) by mouth as needed for erectile dysfunction.     Allergies:   Ivp dye [iodinated diagnostic agents], Hydrocodone, Phentermine, Amlodipine, Lisinopril, and Red dye   Social History   Socioeconomic History  . Marital status: Divorced    Spouse name: Not on file  . Number of children: Not on file  . Years of education: Not on file  . Highest education level: Not on file  Occupational History  . Not on file  Tobacco Use  . Smoking status: Never Smoker  . Smokeless tobacco: Never Used  Vaping Use  . Vaping Use: Never used  Substance and Sexual Activity  . Alcohol use: No    Alcohol/week: 0.0 standard drinks  . Drug use: No  . Sexual activity: Not on file  Other  Topics Concern  . Not on file  Social History Narrative  . Not on file   Social Determinants of Health   Financial Resource Strain:   . Difficulty of Paying Living Expenses: Not on file  Food Insecurity:   . Worried About Charity fundraiser in the Last Year: Not on file  . Ran Out of Food in the Last Year: Not on file  Transportation Needs:   . Lack of Transportation (Medical): Not on file  . Lack of Transportation (Non-Medical): Not on file  Physical Activity:   . Days of Exercise per Week: Not on file  . Minutes of Exercise per Session: Not on file  Stress:   . Feeling of Stress : Not on file  Social Connections:   . Frequency of Communication with Friends and Family: Not on file  . Frequency of Social Gatherings with Friends and Family: Not on file  . Attends Religious Services: Not on file  . Active Member of Clubs or Organizations: Not on file  . Attends Archivist Meetings: Not on file  . Marital Status: Not on file     Family History: The patient's family history includes Aneurysm (age of onset: 81) in his father; Colon cancer in his sister. ROS:   Please see the history of present illness.      All other systems reviewed and are negative.  EKGs/Labs/Other Studies Reviewed:    The following studies were reviewed today:  Echocardiogram 02/06/2019 IMPRESSIONS  1. The left ventricle has moderate-severely reduced systolic function, with an ejection fraction of 30-35%. The cavity size was mildly dilated. There is moderate concentric left ventricular hypertrophy. Left ventricular diastolic Doppler parameters are  consistent with impaired relaxation. Indeterminate filling pressures Left ventrical global hypokinesis without regional wall motion abnormalities.  2. The right ventricle has normal systolic function. The cavity was normal. There is no increase in right ventricular wall thickness.  3. The mitral valve is grossly normal. There is mild to moderate mitral annular calcification present.  4. The tricuspid valve is grossly normal.  5. The aortic valve was not well visualized.  6. The aortic root is normal in size and structure.  Echo Study Conclusions 01/2018 - Left ventricle: The cavity size was mildly dilated. Wall thickness was increased in a pattern of mild LVH. Systolic function was moderately reduced. The estimated ejection fraction was in the range of 35% to 40%. Diffuse hypokinesis. There was no evidence of elevated ventricular filling pressure by Doppler parameters. - Aorta: Aortic root dimension: 44 mm (ED). - Ascending aorta: The ascending aorta was mildly dilated. - Left atrium: The atrium was mildly dilated. - Right ventricle: The cavity size was moderately dilated. Wall thickness was normal. - Right atrium: The atrium was mildly dilated.  Impressions: - EF has mildly improved when compared to prior (25%).   Echo 02/23/17 Study Conclusions  - Left ventricle: The cavity size was severely dilated. Wall thickness was increased in a pattern of moderate LVH. Systolic function was severely reduced. The estimated ejection fraction was in the  range of 25% to 30%. Diffuse hypokinesis. Features are consistent with a pseudonormal left ventricular filling pattern, with concomitant abnormal relaxation and increased filling pressure (grade 2 diastolic dysfunction). Doppler parameters are consistent with high ventricular filling pressure. - Aortic valve: There was trivial regurgitation. - Aortic root: The aortic root was mildly dilated. - Mitral valve: Calcified annulus. There was mild regurgitation. - Left atrium: The atrium was moderately dilated. - Right  atrium: The atrium was moderately dilated.  Impressions:  - Severe global reduction in LV systolic function; severe LVE; moderate LVH; moderate diastolic dysfunction with elevated LV filling pressure; mildly dilated aortic root; trace AI; mild MR; biatrial enlargement.  Carotids  07/02/18 :  Plaque no stenosis f/u 2 years   EKG:  05/12/20 SR rate 69 LVH  Recent Labs: 08/01/2019: BUN 73; Creatinine, Ser 9.60; Potassium 5.2; Sodium 141   Recent Lipid Panel    Component Value Date/Time   CHOL 148 08/01/2019 0844   TRIG 137 08/01/2019 0844   HDL 33 (L) 08/01/2019 0844   CHOLHDL 4.5 08/01/2019 0844   CHOLHDL 4.7 11/04/2014 1202   VLDL 38 11/04/2014 1202   LDLCALC 90 08/01/2019 0844   LDLDIRECT 80 07/18/2012 1010    Physical Exam:    VS:  BP (!) 148/62   Pulse 69   Ht 5\' 9"  (1.753 m)   Wt 209 lb (94.8 kg)   SpO2 99%   BMI 30.86 kg/m     Wt Readings from Last 3 Encounters:  05/13/20 209 lb (94.8 kg)  08/01/19 218 lb 4.8 oz (99 kg)  04/24/19 198 lb (89.8 kg)    Affect appropriate Healthy:  appears stated age HEENT: normal Neck supple with no adenopathy JVP normal no bruits no thyromegaly Lungs clear with no wheezing and good diaphragmatic motion Heart:  S1/S2 no murmur, no rub, gallop or click PMI normal Abdomen: benighn, BS positve, no tenderness, no AAA no bruit.  No HSM or HJR post nephrectomy large ventral hernia  Distal pulses intact  with no bruits No edema Neuro non-focal Skin warm and dry LUE fistula with good thrill     PLAN:    In order of problems listed above:  Chronic combined systolic and diastolic heart failure -EF was down to 25-30% in 02/2017.  Patient placed on optimal medical therapy.  EF improved to 35-40% in 01/2018.  Recent echo on 02/06/2019 showed stable cardiac function with EF 30-35% -Patient not on ACE/ARB/Entresto due to renal failure.  He continues on beta-blocker, hydralazine. Also still on lasix 80 mg BID, still makes some urine. Fluid balance per dialysis. -Patient is currently able to be as active as he likes.  Appears euvolemic.  ESRD -Patient on hemodialysis Tuesday/Thursday/Saturday. -He was previously turned down for renal transplant at [facility].  Per prior notes patient is hopeful to get a kidney from a family member.  His cardiomyopathy may prohibit this from occurring.  Hypertension -On amlodipine 5 mg, carvedilol 25 mg, hydralazine 100 mg 3 times daily -Patient noted to have some blood pressure lability while on dialysis sessions.  Hyperlipidemia -On pravastatin 40 mg daily. -Last lipid panel in December 2020 LDL 90   History of renal cell carcinoma -Status post left radical nephrectomy.   Anemia of chronic disease most likely secondary to CKD -No recent hemoglobin levels in epic.  Labs followed by nephrology.  Prior CVA -On chronic Plavix.  Carotid arteries noted with mild plaque, no significant stenosis. -Patient is asking about coming off of Plavix.  I advised him to speak with his neurologist or his primary care provider on this issue.  Carotid artery disease with prior stroke -prior carotid Dopplers in 06/2018 with only mild plaque buildup 1-39% bilateral.  Plan for repeat carotid Dopplers in 2 years, 06/2020.  Ventral Hernia:  Reducible but large may need referral to general surgery Ok from cardiac perspective to have surgery    Carotid Duplex November 2021  previous  plaque F/U in a year   Signed, Charles Rouge, MD  05/13/2020 9:52 AM    [place]

## 2020-05-13 ENCOUNTER — Other Ambulatory Visit: Payer: Self-pay

## 2020-05-13 ENCOUNTER — Ambulatory Visit (INDEPENDENT_AMBULATORY_CARE_PROVIDER_SITE_OTHER): Payer: Medicare Other | Admitting: Cardiovascular Disease

## 2020-05-13 VITALS — BP 148/62 | HR 69 | Ht 69.0 in | Wt 209.0 lb

## 2020-05-13 DIAGNOSIS — I779 Disorder of arteries and arterioles, unspecified: Secondary | ICD-10-CM

## 2020-05-13 DIAGNOSIS — I5022 Chronic systolic (congestive) heart failure: Secondary | ICD-10-CM | POA: Diagnosis not present

## 2020-05-13 NOTE — Patient Instructions (Addendum)
Medication Instructions:  *If you need a refill on your cardiac medications before your next appointment, please call your pharmacy*  Lab Work: If you have labs (blood work) drawn today and your tests are completely normal, you will receive your results only by: [name] MyChart Message (if you have MyChart) OR . A paper copy in the mail If you have any lab test that is abnormal or we need to change your treatment, we will call you to review the results.  Testing/Procedures: Your physician has requested that you have a carotid duplex in November. This test is an ultrasound of the carotid arteries in your neck. It looks at blood flow through these arteries that supply the brain with blood. Allow one hour for this exam. There are no restrictions or special instructions.  Follow-Up: At [facility], you and your health needs are our priority.  As part of our continuing mission to provide you with exceptional heart care, we have created designated Provider Care Teams.  These Care Teams include your primary Cardiologist (physician) and Advanced Practice Providers (APPs -  Physician Assistants and Nurse Practitioners) who all work together to provide you with the care you need, when you need it.  We recommend signing up for the patient portal called "MyChart".  Sign up information is provided on this After Visit Summary.  MyChart is used to connect with patients for Virtual Visits (Telemedicine).  Patients are able to view lab/test results, encounter notes, upcoming appointments, etc.  Non-urgent messages can be sent to your provider as well.   To learn more about what you can do with MyChart, go to [email or web address].    Your next appointment:   6 month(s)  The format for your next appointment:   In Person  Provider:   You may see [name], MD or one of the following Advanced Practice Providers on your designated Care Team:    [name], NP  Cecilie Kicks, NP  Kathyrn Drown, NP

## 2020-05-18 NOTE — Addendum Note (Signed)
Addended by: Aris Georgia, [place] Mclester L on: 05/18/2020 02:37 PM   Modules accepted: Orders

## 2020-05-20 ENCOUNTER — Encounter: Payer: Self-pay | Admitting: Nurse Practitioner

## 2020-05-20 ENCOUNTER — Ambulatory Visit (INDEPENDENT_AMBULATORY_CARE_PROVIDER_SITE_OTHER): Payer: Medicare Other | Admitting: Nurse Practitioner

## 2020-05-20 VITALS — BP 160/78 | HR 88 | Ht 71.0 in | Wt 214.0 lb

## 2020-05-20 DIAGNOSIS — N186 End stage renal disease: Secondary | ICD-10-CM

## 2020-05-20 DIAGNOSIS — Z992 Dependence on renal dialysis: Secondary | ICD-10-CM | POA: Diagnosis not present

## 2020-05-20 DIAGNOSIS — Z86010 Personal history of colonic polyps: Secondary | ICD-10-CM | POA: Diagnosis not present

## 2020-05-20 DIAGNOSIS — K439 Ventral hernia without obstruction or gangrene: Secondary | ICD-10-CM | POA: Diagnosis not present

## 2020-05-20 NOTE — Progress Notes (Signed)
05/20/2020 Charles Hart [id number] Dec 01, 1955   CHIEF COMPLAINT:  Schedule a colonoscopy required for renal transplant evaluation at [facility]:  Charles Hart. Hagemann is a 64 year old male with a past medical history of hypertension, coronary artery disease s/p MI in the 1990's, cardiomyopathy, CHF with LVEF 30-35%, CVA 2013 carotid artery disease on Plavix, DM II (resolved off insulin), peripheral neuropathy, colon polyps, renal cell carcinoma s/p left radical nephrectomy 05/2018,  ESRD on HD on  T/Th/Saturdays and chronic anemia.  He presents to our office today to schedule a colonoscopy which is required for his renal transplant work up at [facility]. He was turned down by [facility] transplant center. He denies having any upper or lower abdominal pain. He is passing a normal brown formed stools most days. No rectal bleeding or black stools. No dysphagia or heartburn. His appetite is good an weight is stable. No pulmonary disease. He has a large ventral hernia which he stated is painless and developed 3 to 4 months after he had surgery for his renal cancer. He has a history of colon polyps. He underwent a colonoscopy by Dr. Ardis Hughs 05/05/2020, 2 small tubular adenomatous polyps were removed from the colon. Mild diverticulosis to the sigmoid colon was noted. Two sisters with history of colon cancer.   Labs 01/16/2020: Hg 10.5. HCT            01/02/2020: HCT 34.9. Iron 103.   Colonoscopy by Dr. Ardis Hughs 05/06/2011: 2 small tubular adenomatous polyps were removed from the transverse colon Diverticulosis descending and sigmoid colon  Colonoscopy by Dr. Ardis Hughs 02/06/2008: 7 polyps were removed from the ascending, transverse, descending and sigmoid colon, two were 1cm or larger.  1. COLON, ASCENDING, TRANSVERSE AND DESCENDING POLYPS: TUBULAR  ADENOMAS. NO HIGH GRADE DYSPLASIA OR MALIGNANCY IDENTIFIED.   2. COLON, SIGMOID POLYP: TUBULAR ADENOMA WITH FOCAL  HIGH GRADE  GLANDULAR DYSPLASIA. NO EVIDENCE OF INVASIVE CARCINOMA.   COMMENT  2. The sigmoid polyp is tubular adenoma and there is a small  focus with cytologic and architectural atypia consistent with  high grade glandular dysplasia. No evidence of invasive  carcinoma is identified.  ECHO 02/06/2019: 1. The left ventricle has moderate-severely reduced systolic function,  with an ejection fraction of 30-35%. The cavity size was mildly dilated.  There is moderate concentric left ventricular hypertrophy. Left  ventricular diastolic Doppler parameters are  consistent with impaired relaxation. Indeterminate filling pressures Left  ventrical global hypokinesis without regional wall motion abnormalities.  2. The right ventricle has normal systolic function. The cavity was  normal. There is no increase in right ventricular wall thickness.  3. The mitral valve is grossly normal. There is mild to moderate mitral  annular calcification present.  4. The tricuspid valve is grossly normal.  5. The aortic valve was not well visualized.  6. The aortic root is normal in size and structure.  Past Medical History:  Diagnosis Date  . Adenomatous colon polyp 07/02/2011   Last colonoscopy May 06, 2011 by Dr. Owens Loffler, who recommended repeat colonoscopy in 5 years.   . Anemia   . Background diabetic retinopathy 04/20/2012   Patient is followed by Dr. Katy Fitch   . Cardiomyopathy    LV function improved from 2004 to 2008.  Historically, moderately dilated LV with EF 30-40% by 2D echo 08/14/2002.  Mild CAD with severe LV dysfunction by cardiac cath 09/2002.  Normal coronary arteries and normal LV function  by cardiac cath 09/19/2006.  A 2-D echo on 04/01/2009 showed mild concentric hypertrophy and normal systolic (LVEF  20-94%) and doppler C/W with grade 1 diastolic dysfunction.  . CHF (congestive heart failure) ([place])    LV function improved from 2004 to 2008.  Historically, moderately dilated  LV with EF 30-40% by 2D echo 08/14/2002.  Mild CAD with severe LV dysfunction by cardiac cath 09/2002.  Normal coronary arteries and normal LV function by cardiac cath 09/19/2006.  A 2-D echo on 04/01/2009 showed mild concentric hypertrophy and normal systolic (LVEF  70-96%) and doppler C/W with grade 1 diastolic dysfunction..   . Chronic combined systolic and diastolic congestive heart failure ([place]) 05/21/2010   LV function improved from 2004 to 2008.  Historically, moderately dilated LV with EF 30-40% by 2D echo 08/14/2002.  Mild CAD with severe LV dysfunction by cardiac cath 09/2002.  Normal coronary arteries and normal LV function by cardiac cath 09/19/2006.  A 2-D echo on 04/01/2009 showed mild concentric hypertrophy and normal systolic (LVEF  28-36%) and doppler parameters consistent with abnormal left   . CVA (cerebral vascular accident) ([place]) 07/04/2012   MRI of the brain 07/04/2012 showed an acute infarct in the right basal ganglia involving the anterior putamen, anterior limb internal capsule, and head of the caudate; this measured approximately 2.5 cm in diameter.     . Dermatitis   . Diabetes mellitus    type 2  . DIABETIC PERIPHERAL NEUROPATHY 08/03/2007   Qualifier: Diagnosis of  By: Marinda Elk MD, Sonia Side    . DM neuropathy, painful (HCC)    fingers and right knee  . ESRD (end stage renal disease) ([place])    Stage 4, on hemodailysis x 4 months as of 05-18-18 [place] [facility], tues thurs sat  . ESRD (end stage renal disease) on dialysis ([place])    "TTS; [facility]" (05/31/2018)  . Hearing loss in right ear   . Hyperlipidemia   . Hypertension   . Hypertensive crisis 07/28/2012  . Hypertensive urgency 08/20/2014  . Myocardial infarction [facility])  many yrs ago  . Nephrotic syndrome 02/18/2013   A 24-hour urine collection 03/04/2013 showed total protein of 5,460 g and creatinine clearance of 80 mL/minute.  Patient was seen by Seward Meth at [place] and a repeat 24-hour  urine showed 10,407 mg protein.  Patient underwent kidney biopsy on 05/30/2013; pathology showed advanced diffuse and nodular diabetic nephropathy with vascular changes consistent with long-standing difficult to control hypertension.     . Pneumonia 2014 and 2015  . Renal insufficiency   . Sleep apnea    uses cpap "sometimes per pt"   Past Surgical History:  Procedure Laterality Date  . AV FISTULA PLACEMENT Left 11/24/2017   Procedure: INSERTION OF ARTERIOVENOUS (AV) GORE-TEX GRAFT LEFT LOWER ARM;  Surgeon: Rosetta Posner, MD;  Location: [place];  Service: Vascular;  Laterality: Left;  . CARDIAC CATHETERIZATION      4 times  . COLONOSCOPY    . FOOT SURGERY    . POLYPECTOMY    . ROBOT ASSISTED LAPAROSCOPIC NEPHRECTOMY Left 05/30/2018   Procedure: XI ROBOTIC ASSISTED LAPAROSCOPIC NEPHRECTOMY;  Surgeon: Alexis Frock, MD;  [facility];  Service: Urology;  Laterality: Left;    reports that he has never smoked. He has never used smokeless tobacco. He reports that he does not drink alcohol and does not use drugs. family history includes Aneurysm (age of onset: 61) in his father; Colon cancer in his sister. Allergies  Allergen Reactions  . Ivp Dye [Iodinated Diagnostic Agents] Other (See Comments)    Per patient's Nephrologist, he doesn't want the patient exposed to ANY dye because of issues with his kidneys  . Hydrocodone     Caused hands and feet to peel   . Phentermine Swelling  . Amlodipine Swelling  . Lisinopril Cough  . Red Dye Other (See Comments)    NO dye of any kind (issues with his kidneys)      Outpatient Encounter Medications as of 05/20/2020  Medication Sig  . amLODipine (NORVASC) 2.5 MG tablet Take 1 tablet (2.5 mg total) by mouth daily.  . calcium acetate (PHOSLO) 667 MG capsule Take 1,334 mg by mouth 3 (three) times daily with meals.   . carvedilol (COREG) 25 MG tablet Take 1 tablet (25 mg total) by mouth 2 (two) times daily with a meal.  . clopidogrel (PLAVIX) 75  MG tablet Take 75 mg by mouth daily.  . fluticasone (FLONASE) 50 MCG/ACT nasal spray Place 2 sprays into both nostrils as needed for allergies.  . folic acid-vitamin b complex-vitamin c-selenium-zinc (DIALYVITE) 3 MG TABS tablet Take 1 tablet by mouth daily.  . furosemide (LASIX) 80 MG tablet Take 1 tablet (80 mg total) by mouth 2 (two) times daily.  . hydrALAZINE (APRESOLINE) 100 MG tablet Take 1 tablet (100 mg total) by mouth 3 (three) times daily.  . pravastatin (PRAVACHOL) 40 MG tablet Take 1 tablet (40 mg total) by mouth daily.  . sildenafil (VIAGRA) 50 MG tablet Take 1 tablet (50 mg total) by mouth as needed for erectile dysfunction.   No facility-administered encounter medications on file as of 05/20/2020.    REVIEW OF SYSTEMS:  Gen: Denies fever, sweats or chills. No weight loss.  CV: Denies chest pain, palpitations or edema. Resp: Denies cough, shortness of breath of hemoptysis.  GI: See HPI.  GU : ESRD. Produces some urine.  MS: Denies joint pain, muscles aches or weakness. Derm: Denies rash, itchiness, skin lesions or unhealing ulcers. Psych: Denies depression, anxiety or memory loss.  Heme: Denies bruising, bleeding. Neuro:  Denies headaches, dizziness or paresthesias. Endo:  Past DM, no longer on insulin after weight loss s/p left nephrectomy surgery.   PHYSICAL EXAM: BP (!) 160/78   Pulse 88   Ht 5\' 11"  (1.803 m)   Wt 214 lb (97.1 kg)   BMI 29.85 kg/m   General: Well developed  64 year old male in no acute distress. Head: Normocephalic and atraumatic. Eyes:  Sclerae non-icteric, conjunctive pink. Ears: Normal auditory acuity. Mouth: Upper and lower dentrues. No ulcers or lesions.  Neck: Supple, no lymphadenopathy or thyromegaly.  Lungs: Clear bilaterally to auscultation without wheezes, crackles or rhonchi. Heart: Regular rate and rhythm. No murmur, rub or gallop appreciated.  Abdomen: Soft, nontender. Large protruding ventral hernia approximately 8cm in length x  6 cm. No hepatosplenomegaly. Normoactive bowel sounds x 4 quadrants.  Rectal: Deferred.  Musculoskeletal: Symmetrical with no gross deformities. Skin: Warm and dry. No rash or lesions on visible extremities. Extremities: No edema. LUE AV graft with + bruit and thrill.  Neurological: Alert oriented x 4, no focal deficits.  Psychological:  Alert and cooperative. Normal mood and affect.  ASSESSMENT AND PLAN:  45. 64 year old male with a history of renal cell ca, ESRD on HD and tubular adenomatous colon polyps requires a colonoscopy for renal transplant evaluation at [facility]. His last colonoscopy was in 2012, two small tubular adenomatous polyps were  removed at that time. Two sisters with history of colon cancer.  -Colonoscopy benefits and risks discussed including risk with sedation, risk of bleeding, perforation and infection. Colonoscopy not scheduled at this time due to concerns regarding his large ventral hernia. Dr. Ardis Hughs to review today's consult prior to scheduling a colonoscopy.  2. Large ventral hernia -Patient elects to follow up with his PCP for general surgery referral/evaluation   3. History of CAD. CHF with LVEF 30-35% per ECHO 01/2019. Remains on Lasix 80mg  po bid.  Last seen by his cardiologist D. Nishan 05/13/2020  4. Carotid artery disease. Patient reported he recently stopped taking Plavix.   5. Chronic anemia secondary to ESRD. Hg 10.5 01/16/2020. IV Venofer 01/14/2020. -Continue nephrology follow up     CC:  Lucious Groves, DO

## 2020-05-20 NOTE — H&P (View-Only) (Signed)
05/20/2020 Charles Hart [id number] 1956/03/01   CHIEF COMPLAINT:  Schedule a colonoscopy required for renal transplant evaluation at [facility]:  Charles Hart is a 64 year old male with a past medical history of hypertension, coronary artery disease s/p MI in the 1990's, cardiomyopathy, CHF with LVEF 30-35%, CVA 2013 carotid artery disease on Plavix, DM II (resolved off insulin), peripheral neuropathy, colon polyps, renal cell carcinoma s/p left radical nephrectomy 05/2018,  ESRD on HD on  T/Th/Saturdays and chronic anemia.  He presents to our office today to schedule a colonoscopy which is required for his renal transplant work up at [facility]. He was turned down by [facility] transplant center. He denies having any upper or lower abdominal pain. He is passing a normal brown formed stools most days. No rectal bleeding or black stools. No dysphagia or heartburn. His appetite is good an weight is stable. No pulmonary disease. He has a large ventral hernia which he stated is painless and developed 3 to 4 months after he had surgery for his renal cancer. He has a history of colon polyps. He underwent a colonoscopy by Dr. Ardis Hughs 05/05/2020, 2 small tubular adenomatous polyps were removed from the colon. Mild diverticulosis to the sigmoid colon was noted. Two sisters with history of colon cancer.   Labs 01/16/2020: Hg 10.5. HCT            01/02/2020: HCT 34.9. Iron 103.   Colonoscopy by Dr. Ardis Hughs 05/06/2011: 2 small tubular adenomatous polyps were removed from the transverse colon Diverticulosis descending and sigmoid colon  Colonoscopy by Dr. Ardis Hughs 02/06/2008: 7 polyps were removed from the ascending, transverse, descending and sigmoid colon, two were 1cm or larger.  1. COLON, ASCENDING, TRANSVERSE AND DESCENDING POLYPS: TUBULAR  ADENOMAS. NO HIGH GRADE DYSPLASIA OR MALIGNANCY IDENTIFIED.   2. COLON, SIGMOID POLYP: TUBULAR ADENOMA WITH FOCAL  HIGH GRADE  GLANDULAR DYSPLASIA. NO EVIDENCE OF INVASIVE CARCINOMA.   COMMENT  2. The sigmoid polyp is tubular adenoma and there is a small  focus with cytologic and architectural atypia consistent with  high grade glandular dysplasia. No evidence of invasive  carcinoma is identified.  ECHO 02/06/2019: 1. The left ventricle has moderate-severely reduced systolic function,  with an ejection fraction of 30-35%. The cavity size was mildly dilated.  There is moderate concentric left ventricular hypertrophy. Left  ventricular diastolic Doppler parameters are  consistent with impaired relaxation. Indeterminate filling pressures Left  ventrical global hypokinesis without regional wall motion abnormalities.  2. The right ventricle has normal systolic function. The cavity was  normal. There is no increase in right ventricular wall thickness.  3. The mitral valve is grossly normal. There is mild to moderate mitral  annular calcification present.  4. The tricuspid valve is grossly normal.  5. The aortic valve was not well visualized.  6. The aortic root is normal in size and structure.  Past Medical History:  Diagnosis Date  . Adenomatous colon polyp 07/02/2011   Last colonoscopy May 06, 2011 by Dr. Owens Loffler, who recommended repeat colonoscopy in 5 years.   . Anemia   . Background diabetic retinopathy 04/20/2012   Patient is followed by Dr. Katy Fitch   . Cardiomyopathy    LV function improved from 2004 to 2008.  Historically, moderately dilated LV with EF 30-40% by 2D echo 08/14/2002.  Mild CAD with severe LV dysfunction by cardiac cath 09/2002.  Normal coronary arteries and normal LV function  by cardiac cath 09/19/2006.  A 2-D echo on 04/01/2009 showed mild concentric hypertrophy and normal systolic (LVEF  51-76%) and doppler C/W with grade 1 diastolic dysfunction.  . CHF (congestive heart failure) ([place])    LV function improved from 2004 to 2008.  Historically, moderately dilated  LV with EF 30-40% by 2D echo 08/14/2002.  Mild CAD with severe LV dysfunction by cardiac cath 09/2002.  Normal coronary arteries and normal LV function by cardiac cath 09/19/2006.  A 2-D echo on 04/01/2009 showed mild concentric hypertrophy and normal systolic (LVEF  16-07%) and doppler C/W with grade 1 diastolic dysfunction..   . Chronic combined systolic and diastolic congestive heart failure ([place]) 05/21/2010   LV function improved from 2004 to 2008.  Historically, moderately dilated LV with EF 30-40% by 2D echo 08/14/2002.  Mild CAD with severe LV dysfunction by cardiac cath 09/2002.  Normal coronary arteries and normal LV function by cardiac cath 09/19/2006.  A 2-D echo on 04/01/2009 showed mild concentric hypertrophy and normal systolic (LVEF  37-10%) and doppler parameters consistent with abnormal left   . CVA (cerebral vascular accident) ([place]) 07/04/2012   MRI of the brain 07/04/2012 showed an acute infarct in the right basal ganglia involving the anterior putamen, anterior limb internal capsule, and head of the caudate; this measured approximately 2.5 cm in diameter.     . Dermatitis   . Diabetes mellitus    type 2  . DIABETIC PERIPHERAL NEUROPATHY 08/03/2007   Qualifier: Diagnosis of  By: Marinda Elk MD, Sonia Side    . DM neuropathy, painful (HCC)    fingers and right knee  . ESRD (end stage renal disease) ([place])    Stage 4, on hemodailysis x 4 months as of 05-18-18 [place] [facility], tues thurs sat  . ESRD (end stage renal disease) on dialysis ([place])    "TTS; [facility]" (05/31/2018)  . Hearing loss in right ear   . Hyperlipidemia   . Hypertension   . Hypertensive crisis 07/28/2012  . Hypertensive urgency 08/20/2014  . Myocardial infarction [facility])  many yrs ago  . Nephrotic syndrome 02/18/2013   A 24-hour urine collection 03/04/2013 showed total protein of 5,460 g and creatinine clearance of 80 mL/minute.  Patient was seen by Seward Meth at [place] and a repeat 24-hour  urine showed 10,407 mg protein.  Patient underwent kidney biopsy on 05/30/2013; pathology showed advanced diffuse and nodular diabetic nephropathy with vascular changes consistent with long-standing difficult to control hypertension.     . Pneumonia 2014 and 2015  . Renal insufficiency   . Sleep apnea    uses cpap "sometimes per pt"   Past Surgical History:  Procedure Laterality Date  . AV FISTULA PLACEMENT Left 11/24/2017   Procedure: INSERTION OF ARTERIOVENOUS (AV) GORE-TEX GRAFT LEFT LOWER ARM;  Surgeon: Rosetta Posner, MD;  Location: [place];  Service: Vascular;  Laterality: Left;  . CARDIAC CATHETERIZATION      4 times  . COLONOSCOPY    . FOOT SURGERY    . POLYPECTOMY    . ROBOT ASSISTED LAPAROSCOPIC NEPHRECTOMY Left 05/30/2018   Procedure: XI ROBOTIC ASSISTED LAPAROSCOPIC NEPHRECTOMY;  Surgeon: Alexis Frock, MD;  [facility];  Service: Urology;  Laterality: Left;    reports that he has never smoked. He has never used smokeless tobacco. He reports that he does not drink alcohol and does not use drugs. family history includes Aneurysm (age of onset: 99) in his father; Colon cancer in his sister. Allergies  Allergen Reactions  . Ivp Dye [Iodinated Diagnostic Agents] Other (See Comments)    Per patient's Nephrologist, he doesn't want the patient exposed to ANY dye because of issues with his kidneys  . Hydrocodone     Caused hands and feet to peel   . Phentermine Swelling  . Amlodipine Swelling  . Lisinopril Cough  . Red Dye Other (See Comments)    NO dye of any kind (issues with his kidneys)      Outpatient Encounter Medications as of 05/20/2020  Medication Sig  . amLODipine (NORVASC) 2.5 MG tablet Take 1 tablet (2.5 mg total) by mouth daily.  . calcium acetate (PHOSLO) 667 MG capsule Take 1,334 mg by mouth 3 (three) times daily with meals.   . carvedilol (COREG) 25 MG tablet Take 1 tablet (25 mg total) by mouth 2 (two) times daily with a meal.  . clopidogrel (PLAVIX) 75  MG tablet Take 75 mg by mouth daily.  . fluticasone (FLONASE) 50 MCG/ACT nasal spray Place 2 sprays into both nostrils as needed for allergies.  . folic acid-vitamin b complex-vitamin c-selenium-zinc (DIALYVITE) 3 MG TABS tablet Take 1 tablet by mouth daily.  . furosemide (LASIX) 80 MG tablet Take 1 tablet (80 mg total) by mouth 2 (two) times daily.  . hydrALAZINE (APRESOLINE) 100 MG tablet Take 1 tablet (100 mg total) by mouth 3 (three) times daily.  . pravastatin (PRAVACHOL) 40 MG tablet Take 1 tablet (40 mg total) by mouth daily.  . sildenafil (VIAGRA) 50 MG tablet Take 1 tablet (50 mg total) by mouth as needed for erectile dysfunction.   No facility-administered encounter medications on file as of 05/20/2020.    REVIEW OF SYSTEMS:  Gen: Denies fever, sweats or chills. No weight loss.  CV: Denies chest pain, palpitations or edema. Resp: Denies cough, shortness of breath of hemoptysis.  GI: See HPI.  GU : ESRD. Produces some urine.  MS: Denies joint pain, muscles aches or weakness. Derm: Denies rash, itchiness, skin lesions or unhealing ulcers. Psych: Denies depression, anxiety or memory loss.  Heme: Denies bruising, bleeding. Neuro:  Denies headaches, dizziness or paresthesias. Endo:  Past DM, no longer on insulin after weight loss s/p left nephrectomy surgery.   PHYSICAL EXAM: BP (!) 160/78   Pulse 88   Ht 5\' 11"  (1.803 m)   Wt 214 lb (97.1 kg)   BMI 29.85 kg/m   General: Well developed  63 year old male in no acute distress. Head: Normocephalic and atraumatic. Eyes:  Sclerae non-icteric, conjunctive pink. Ears: Normal auditory acuity. Mouth: Upper and lower dentrues. No ulcers or lesions.  Neck: Supple, no lymphadenopathy or thyromegaly.  Lungs: Clear bilaterally to auscultation without wheezes, crackles or rhonchi. Heart: Regular rate and rhythm. No murmur, rub or gallop appreciated.  Abdomen: Soft, nontender. Large protruding ventral hernia approximately 8cm in length x  6 cm. No hepatosplenomegaly. Normoactive bowel sounds x 4 quadrants.  Rectal: Deferred.  Musculoskeletal: Symmetrical with no gross deformities. Skin: Warm and dry. No rash or lesions on visible extremities. Extremities: No edema. LUE AV graft with + bruit and thrill.  Neurological: Alert oriented x 4, no focal deficits.  Psychological:  Alert and cooperative. Normal mood and affect.  ASSESSMENT AND PLAN:  63. 64 year old male with a history of renal cell ca, ESRD on HD and tubular adenomatous colon polyps requires a colonoscopy for renal transplant evaluation at [facility]. His last colonoscopy was in 2012, two small tubular adenomatous polyps were  removed at that time. Two sisters with history of colon cancer.  -Colonoscopy benefits and risks discussed including risk with sedation, risk of bleeding, perforation and infection. Colonoscopy not scheduled at this time due to concerns regarding his large ventral hernia. Dr. Ardis Hughs to review today's consult prior to scheduling a colonoscopy.  2. Large ventral hernia -Patient elects to follow up with his PCP for general surgery referral/evaluation   3. History of CAD. CHF with LVEF 30-35% per ECHO 01/2019. Remains on Lasix 80mg  po bid.  Last seen by his cardiologist D. Nishan 05/13/2020  4. Carotid artery disease. Patient reported he recently stopped taking Plavix.   5. Chronic anemia secondary to ESRD. Hg 10.5 01/16/2020. IV Venofer 01/14/2020. -Continue nephrology follow up     CC:  Lucious Groves, DO

## 2020-05-20 NOTE — Patient Instructions (Signed)
Office will call to schedule colonoscopy after Dr. Ardis Hughs has reviewed today's exam and consult.   Follow-up with your Primary Care Physician regarding referral to General Surgery - Large Ventral Hernia.   If you are age 63 or older, your body mass index should be between 23-30. Your Body mass index is 29.85 kg/m. If this is out of the aforementioned range listed, please consider follow up with your Primary Care Provider.  If you are age 84 or younger, your body mass index should be between 19-25. Your Body mass index is 29.85 kg/m. If this is out of the aformentioned range listed, please consider follow up with your Primary Care Provider.    Thank you for choosing me and [facility].  Carl Best, CRNP

## 2020-05-21 NOTE — Progress Notes (Signed)
I agree with the above note, plan. Given his poor cardiac function this colonoscopy will need to be done at [facility].  Patty, See above.  Thanks.  Ok for my next available [facility] spot

## 2020-05-25 ENCOUNTER — Telehealth: Payer: Self-pay

## 2020-05-25 NOTE — Telephone Encounter (Signed)
I agree with the above note, plan. Given his poor cardiac function this colonoscopy will need to be done at [facility].  Zakkiyya Barno, See above.  Thanks.  Ok for my next available [facility] spot

## 2020-05-25 NOTE — Telephone Encounter (Signed)
-----   Message from Milus Banister, MD sent at 05/21/2020  7:23 AM EDT -----   ----- Message ----- From: Noralyn Pick, NP Sent: 05/20/2020   5:25 PM EDT To: Milus Banister, MD

## 2020-05-26 ENCOUNTER — Encounter: Payer: Self-pay | Admitting: Internal Medicine

## 2020-05-26 ENCOUNTER — Ambulatory Visit (INDEPENDENT_AMBULATORY_CARE_PROVIDER_SITE_OTHER): Payer: Medicare Other | Admitting: Internal Medicine

## 2020-05-26 ENCOUNTER — Other Ambulatory Visit: Payer: Self-pay

## 2020-05-26 VITALS — BP 132/78 | HR 84 | Temp 98.1°F | Ht 72.0 in | Wt 208.3 lb

## 2020-05-26 DIAGNOSIS — K439 Ventral hernia without obstruction or gangrene: Secondary | ICD-10-CM

## 2020-05-26 DIAGNOSIS — N186 End stage renal disease: Secondary | ICD-10-CM

## 2020-05-26 DIAGNOSIS — I132 Hypertensive heart and chronic kidney disease with heart failure and with stage 5 chronic kidney disease, or end stage renal disease: Secondary | ICD-10-CM

## 2020-05-26 DIAGNOSIS — E1122 Type 2 diabetes mellitus with diabetic chronic kidney disease: Secondary | ICD-10-CM

## 2020-05-26 DIAGNOSIS — I5041 Acute combined systolic (congestive) and diastolic (congestive) heart failure: Secondary | ICD-10-CM

## 2020-05-26 NOTE — Patient Instructions (Signed)
Thank you for allowing [name] to provide your care today. Today we discussed your ventral hernia    I have ordered no labs for you. I will call if any are abnormal.    Today we made no changes to your medications.    Please follow-up as needed.    Should you have any questions or concerns please call the internal medicine clinic at [phone].     Hernia, Adult     A hernia is the bulging of an organ or tissue through a weak spot in the muscles of the abdomen (abdominal wall). Hernias develop most often near the belly button (navel) or the area where the leg meets the lower abdomen (groin). Common types of hernias include:  Incisional hernia. This type bulges through a scar from an abdominal surgery.  Umbilical hernia. This type develops near the navel.  Inguinal hernia. This type develops in the groin or scrotum.  Femoral hernia. This type develops under the groin, in the upper thigh area.  Hiatal hernia. This type occurs when part of the stomach slides above the muscle that separates the abdomen from the chest (diaphragm). What are the causes? This condition may be caused by:  Heavy lifting.  Coughing over a long period of time.  Straining to have a bowel movement. Constipation can lead to straining.  An incision made during an abdominal surgery.  A physical problem that is present at birth (congenital defect).  Being overweight or obese.  Smoking.  Excess fluid in the abdomen.  Undescended testicles in males. What are the signs or symptoms? The main symptom is a skin-colored, rounded bulge in the area of the hernia. However, a bulge may not always be present. It may grow bigger or be more visible when you cough or strain (such as when lifting something heavy). A hernia that can be pushed back into the area (is reducible) rarely causes pain. A hernia that cannot be pushed back into the area (is incarcerated) may lose its blood supply (become strangulated). A hernia  that is incarcerated may cause:  Pain.  Fever.  Nausea and vomiting.  Swelling.  Constipation. How is this diagnosed? A hernia may be diagnosed based on:  Your symptoms and medical history.  A physical exam. Your health care provider may ask you to cough or move in certain ways to see if the hernia becomes visible.  Imaging tests, such as: ? X-rays. ? Ultrasound. ? CT scan. How is this treated? A hernia that is small and painless may not need to be treated. A hernia that is large or painful may be treated with surgery. Inguinal hernias may be treated with surgery to prevent incarceration or strangulation. Strangulated hernias are always treated with surgery because a lack of blood supply to the trapped organ or tissue can cause it to die. Surgery to treat a hernia involves pushing the bulge back into place and repairing the weak area of the muscle or abdominal wall. Follow these instructions at home: Activity  Avoid straining.  Do not lift anything that is heavier than 10 lb (4.5 kg), or the limit that you are told, until your health care provider says that it is safe.  When lifting heavy objects, lift with your leg muscles, not your back muscles. Preventing constipation  Take actions to prevent constipation. Constipation leads to straining with bowel movements, which can make a hernia worse or cause a hernia repair to break down. Your health care provider may recommend that you: ?  Drink enough fluid to keep your urine pale yellow. ? Eat foods that are high in fiber, such as fresh fruits and vegetables, whole grains, and beans. ? Limit foods that are high in fat and processed sugars, such as fried or sweet foods. ? Take an over-the-counter or prescription medicine for constipation. General instructions  When coughing, try to cough gently.  You may try to push the hernia back in place by very gently pressing on it while lying down. Do not try to force the bulge back in if  it will not push in easily.  If you are overweight, work with your health care provider to lose weight safely.  Do not use any products that contain nicotine or tobacco, such as cigarettes and e-cigarettes. If you need help quitting, ask your health care provider.  If you are scheduled for hernia repair, watch your hernia for any changes in shape, size, or color. Tell your health care provider about any changes or new symptoms.  Take over-the-counter and prescription medicines only as told by your health care provider.  Keep all follow-up visits as told by your health care provider. This is important. Contact a health care provider if:  You develop new pain, swelling, or redness around your hernia.  You have signs of constipation, such as: ? Fewer bowel movements in a week than normal. ? Difficulty having a bowel movement. ? Stools that are dry, hard, or larger than normal. Get help right away if:  You have a fever.  You have abdomen pain that gets worse.  You feel nauseous or you vomit.  You cannot push the hernia back in place by very gently pressing on it while lying down. Do not try to force the bulge back in if it will not push in easily.  The hernia: ? Changes in shape, size, or color. ? Feels hard or tender. These symptoms may represent a serious problem that is an emergency. Do not wait to see if the symptoms will go away. Get medical help right away. Call your local emergency services (911 in the [place] Summary  A hernia is the bulging of an organ or tissue through a weak spot in the muscles of the abdomen (abdominal wall).  The main symptom is a skin-colored, rounded lump (bulge) in the hernia area. However, a bulge may not always be present. It may grow bigger or more visible when you cough or strain (such as when having a bowel movement).  A hernia that is small and painless may not need to be treated. A hernia that is large or painful may be treated with  surgery.  Surgery to treat a hernia involves pushing the bulge back into place and repairing the weak part of the abdomen. This information is not intended to replace advice given to you by your health care provider. Make sure you discuss any questions you have with your health care provider. Document Revised: 11/22/2018 Document Reviewed: 05/03/2017 Elsevier Patient Education  [place].

## 2020-05-26 NOTE — Assessment & Plan Note (Addendum)
Charles Hart is a 64 yo M w/ PMH of T2DM, ESRD on HD TuThSa, RCC s/p nephrectomy, combined chronic systolic, diastolic heart failure presenting to [facility] w/ complaint of abdominal hernia. He mentions this is chronic issues that has been growing in size since his nephrectomy in 2019. He feels this was caused by the surgery. Denies any abdominal pain, nausea, vomiting, diarrhea,constipation or hematochezia. He mentions he was told he need to get colonoscopy for as part of his pre-renal transplant evaluation but was told he needed to get his hernia fixed prior to the colonoscopy.  A/P Present w/ large reducible ventral hernia. Asymptomatic but needs hernia repair prior getting colonoscopy. Will make referral for general surgery.   Charles Hart has multiple comorbidities that place him in above average risk for surgical complications. However, he is currently able to perform >4 METs (such as climbing a set of stairs) and does not require further cardiac testing prior to surgery. Per ACS, NSQIP risk assessment, he is at 7.1% risk of serious complication and 4.3% risk of any complication.   - Referral to general surgery made - Advised on red-flag symptoms for return to clinic or to go to ED

## 2020-05-26 NOTE — Progress Notes (Signed)
CC: Abdominal hernia  HPI: [name] is a 64 y.o. with PMH listed below presenting with complaint of hernia. Please see problem based assessment and plan for further details.  Past Medical History:  Diagnosis Date  . Adenomatous colon polyp 07/02/2011   Last colonoscopy May 06, 2011 by Dr. Owens Loffler, who recommended repeat colonoscopy in 5 years.   . Anemia   . Background diabetic retinopathy 04/20/2012   Patient is followed by Dr. Katy Fitch   . Cardiomyopathy    LV function improved from 2004 to 2008.  Historically, moderately dilated LV with EF 30-40% by 2D echo 08/14/2002.  Mild CAD with severe LV dysfunction by cardiac cath 09/2002.  Normal coronary arteries and normal LV function by cardiac cath 09/19/2006.  A 2-D echo on 04/01/2009 showed mild concentric hypertrophy and normal systolic (LVEF  14-48%) and doppler C/W with grade 1 diastolic dysfunction.  . CHF (congestive heart failure) ([place])    LV function improved from 2004 to 2008.  Historically, moderately dilated LV with EF 30-40% by 2D echo 08/14/2002.  Mild CAD with severe LV dysfunction by cardiac cath 09/2002.  Normal coronary arteries and normal LV function by cardiac cath 09/19/2006.  A 2-D echo on 04/01/2009 showed mild concentric hypertrophy and normal systolic (LVEF  18-56%) and doppler C/W with grade 1 diastolic dysfunction..   . Chronic combined systolic and diastolic congestive heart failure ([place]) 05/21/2010   LV function improved from 2004 to 2008.  Historically, moderately dilated LV with EF 30-40% by 2D echo 08/14/2002.  Mild CAD with severe LV dysfunction by cardiac cath 09/2002.  Normal coronary arteries and normal LV function by cardiac cath 09/19/2006.  A 2-D echo on 04/01/2009 showed mild concentric hypertrophy and normal systolic (LVEF  31-49%) and doppler parameters consistent with abnormal left   . CVA (cerebral vascular accident) ([place]) 07/04/2012   MRI of the brain 07/04/2012 showed an acute infarct in the  right basal ganglia involving the anterior putamen, anterior limb internal capsule, and head of the caudate; this measured approximately 2.5 cm in diameter.     . Dermatitis   . Diabetes mellitus    type 2  . DIABETIC PERIPHERAL NEUROPATHY 08/03/2007   Qualifier: Diagnosis of  By: Marinda Elk MD, Sonia Side    . DM neuropathy, painful (HCC)    fingers and right knee  . ESRD (end stage renal disease) ([place])    Stage 4, on hemodailysis x 4 months as of 05-18-18 [facility], tues thurs sat  . ESRD (end stage renal disease) on dialysis ([place])    "TTS; [facility]" (05/31/2018)  . Hearing loss in right ear   . Hyperlipidemia   . Hypertension   . Hypertensive crisis 07/28/2012  . Hypertensive urgency 08/20/2014  . Myocardial infarction [facility])  many yrs ago  . Nephrotic syndrome 02/18/2013   A 24-hour urine collection 03/04/2013 showed total protein of 5,460 g and creatinine clearance of 80 mL/minute.  Patient was seen by Seward Meth at [place] and a repeat 24-hour urine showed 10,407 mg protein.  Patient underwent kidney biopsy on 05/30/2013; pathology showed advanced diffuse and nodular diabetic nephropathy with vascular changes consistent with long-standing difficult to control hypertension.     . Pneumonia 2014 and 2015  . Renal insufficiency   . Sleep apnea    uses cpap "sometimes per pt"    Review of Systems: Review of Systems  Constitutional: Negative for chills, fever and malaise/fatigue.  Eyes: Negative for blurred  vision.  Respiratory: Negative for shortness of breath.   Cardiovascular: Negative for chest pain, palpitations and leg swelling.  Gastrointestinal: Negative for abdominal pain, blood in stool, constipation, diarrhea, melena, nausea and vomiting.  Genitourinary: Negative for dysuria.  Neurological: Negative for dizziness.  All other systems reviewed and are negative.    Physical Exam: Vitals:   05/26/20 1417  BP: 132/78  Pulse: 84  Temp:  98.1 F (36.7 C)  TempSrc: Oral  SpO2: 98%  Weight: 208 lb 4.8 oz (94.5 kg)  Height: 6' (1.829 m)    Gen: Well-developed, well nourished, NAD HEENT: NCAT head, poor hearing CV: RRR, S1, S2 normal Pulm: CTAB, No rales, no wheezes Abd: NTND, BS+, Large reducible ventral hernia (~9 by 7cm) without tenderness, edema or warmth near umbilicus Extm: ROM intact, Peripheral pulses intact, LUE fistula covered w/ fresh bandaging Skin: Dry, Warm, normal turgor  Assessment & Plan:   Ventral hernia without obstruction or gangrene Charles Hart is a 64 yo M w/ PMH of T2DM, ESRD on HD TuThSa, RCC s/p nephrectomy, combined chronic systolic, diastolic heart failure presenting to [facility] w/ complaint of abdominal hernia. He mentions this is chronic issues that has been growing in size since his nephrectomy in 2019. He feels this was caused by the surgery. Denies any abdominal pain, nausea, vomiting, diarrhea,constipation or hematochezia. He mentions he was told he need to get colonoscopy for as part of his pre-renal transplant evaluation but was told he needed to get his hernia fixed prior to the colonoscopy.  A/P Present w/ large reducible ventral hernia. Asymptomatic but needs hernia repair prior getting colonoscopy. Will make referral for general surgery.   Charles Hart has multiple comorbidities that place him in above average risk for surgical complications. However, he is currently able to perform >4 METs (such as climbing a set of stairs) and does not require further cardiac testing prior to surgery. Per ACS, NSQIP risk assessment, he is at 7.1% risk of serious complication and 4.3% risk of any complication.   - Referral to general surgery made - Advised on red-flag symptoms for return to clinic or to go to ED     Patient discussed with Dr. Jimmye Norman  -Gilberto Better, [place] Pager: [phone]

## 2020-05-29 NOTE — Progress Notes (Signed)
Internal Medicine Clinic Attending  Case discussed with Dr. Lee  At the time of the visit.  We reviewed the resident's history and exam and pertinent patient test results.  I agree with the assessment, diagnosis, and plan of care documented in the resident's note.    

## 2020-06-02 ENCOUNTER — Other Ambulatory Visit: Payer: Self-pay

## 2020-06-02 DIAGNOSIS — Z86010 Personal history of colonic polyps: Secondary | ICD-10-CM

## 2020-06-02 NOTE — Telephone Encounter (Signed)
Colon was scheduled for 06/18/20 at 1030 am at [facility] with Dr Ardis Hughs.  COVID test on 06/15/20 at 940 am.  All information given to the pt and mailed to the home

## 2020-06-15 ENCOUNTER — Other Ambulatory Visit (HOSPITAL_COMMUNITY)
Admission: RE | Admit: 2020-06-15 | Discharge: 2020-06-15 | Disposition: A | Discharge status: Home / Self Care | Payer: Medicare Other | Source: Ambulatory Visit | Attending: Gastroenterology | Admitting: Gastroenterology

## 2020-06-15 ENCOUNTER — Ambulatory Visit (HOSPITAL_COMMUNITY)
Admission: RE | Admit: 2020-06-15 | Discharge: 2020-06-15 | Disposition: A | Discharge status: Home / Self Care | Payer: Medicare Other | Source: Ambulatory Visit | Attending: Cardiology | Admitting: Cardiology

## 2020-06-15 ENCOUNTER — Other Ambulatory Visit: Payer: Self-pay

## 2020-06-15 DIAGNOSIS — I779 Disorder of arteries and arterioles, unspecified: Secondary | ICD-10-CM | POA: Diagnosis present

## 2020-06-15 DIAGNOSIS — Z20822 Contact with and (suspected) exposure to covid-19: Secondary | ICD-10-CM | POA: Insufficient documentation

## 2020-06-15 DIAGNOSIS — Z01818 Encounter for other preprocedural examination: Secondary | ICD-10-CM | POA: Insufficient documentation

## 2020-06-15 LAB — SARS CORONAVIRUS 2 (TAT 6-24 HRS): SARS Coronavirus 2: NEGATIVE

## 2020-06-17 ENCOUNTER — Telehealth: Payer: Self-pay | Admitting: *Deleted

## 2020-06-17 NOTE — Telephone Encounter (Signed)
Pt request COVID result, states [facility] required him to have the test but told him it would be $30.00 torelease the results to him in writing. Results given to pt.

## 2020-06-18 ENCOUNTER — Ambulatory Visit (HOSPITAL_COMMUNITY): Payer: Medicare Other | Admitting: Certified Registered Nurse Anesthetist

## 2020-06-18 ENCOUNTER — Encounter (HOSPITAL_COMMUNITY)
Admission: RE | Disposition: A | Discharge status: Home / Self Care | Payer: Self-pay | Source: Home / Self Care | Attending: Gastroenterology

## 2020-06-18 ENCOUNTER — Ambulatory Visit (HOSPITAL_COMMUNITY)
Admission: RE | Admit: 2020-06-18 | Discharge: 2020-06-18 | Disposition: A | Discharge status: Home / Self Care | Payer: Medicare Other | Attending: Gastroenterology | Admitting: Gastroenterology

## 2020-06-18 ENCOUNTER — Encounter (HOSPITAL_COMMUNITY): Payer: Self-pay | Admitting: Gastroenterology

## 2020-06-18 DIAGNOSIS — K621 Rectal polyp: Secondary | ICD-10-CM | POA: Diagnosis not present

## 2020-06-18 DIAGNOSIS — Z86010 Personal history of colonic polyps: Secondary | ICD-10-CM | POA: Insufficient documentation

## 2020-06-18 DIAGNOSIS — Z1211 Encounter for screening for malignant neoplasm of colon: Secondary | ICD-10-CM | POA: Diagnosis not present

## 2020-06-18 DIAGNOSIS — D125 Benign neoplasm of sigmoid colon: Secondary | ICD-10-CM | POA: Diagnosis not present

## 2020-06-18 DIAGNOSIS — K635 Polyp of colon: Secondary | ICD-10-CM

## 2020-06-18 DIAGNOSIS — Z01818 Encounter for other preprocedural examination: Secondary | ICD-10-CM | POA: Diagnosis not present

## 2020-06-18 DIAGNOSIS — D124 Benign neoplasm of descending colon: Secondary | ICD-10-CM | POA: Diagnosis not present

## 2020-06-18 DIAGNOSIS — D122 Benign neoplasm of ascending colon: Secondary | ICD-10-CM | POA: Diagnosis not present

## 2020-06-18 DIAGNOSIS — D123 Benign neoplasm of transverse colon: Secondary | ICD-10-CM | POA: Insufficient documentation

## 2020-06-18 DIAGNOSIS — D128 Benign neoplasm of rectum: Secondary | ICD-10-CM | POA: Diagnosis not present

## 2020-06-18 DIAGNOSIS — K5730 Diverticulosis of large intestine without perforation or abscess without bleeding: Secondary | ICD-10-CM | POA: Insufficient documentation

## 2020-06-18 HISTORY — PX: POLYPECTOMY: SHX5525

## 2020-06-18 HISTORY — PX: COLONOSCOPY WITH PROPOFOL: SHX5780

## 2020-06-18 SURGERY — COLONOSCOPY WITH PROPOFOL
Anesthesia: Monitor Anesthesia Care

## 2020-06-18 MED ORDER — SODIUM CHLORIDE 0.9 % IV SOLN: INTRAVENOUS | Status: DC

## 2020-06-18 MED ORDER — PROPOFOL 500 MG/50ML IV EMUL
INTRAVENOUS | Status: DC | PRN
  Administered 2020-06-18: 150 ug/kg/min via INTRAVENOUS

## 2020-06-18 SURGICAL SUPPLY — 21 items

## 2020-06-18 NOTE — Op Note (Addendum)
[facility] Patient Name: Charles Hart Procedure Date: 06/18/2020 MRN: [id number] Attending MD: Milus Banister , MD Date of Birth: 09-Oct-1955 CSN: [id number] Age: 64 Admit Type: Outpatient Procedure:                Colonoscopy Indications:              High risk colon cancer surveillance: Personal                            history of colonic polyps Providers:                Milus Banister, MD, Angus Seller, Elspeth Cho Tech., Technician, Lesia Sago,                            Technician, Herbie Drape, CRNA Referring MD:              Medicines:                Monitored Anesthesia Care Complications:            No immediate complications. Estimated blood loss:                            None. Estimated Blood Loss:     Estimated blood loss: none. Procedure:                Pre-Anesthesia Assessment:                           - Prior to the procedure, a History and Physical                            was performed, and patient medications and                            allergies were reviewed. The patient's tolerance of                            previous anesthesia was also reviewed. The risks                            and benefits of the procedure and the sedation                            options and risks were discussed with the patient.                            All questions were answered, and informed consent                            was obtained. Prior Anticoagulants: The patient has                            taken no previous anticoagulant or  antiplatelet                            agents. ASA Grade Assessment: IV - A patient with                            severe systemic disease that is a constant threat                            to life. After reviewing the risks and benefits,                            the patient was deemed in satisfactory condition to                            undergo the procedure.                            After obtaining informed consent, the colonoscope                            was passed under direct vision. Throughout the                            procedure, the patient's blood pressure, pulse, and                            oxygen saturations were monitored continuously. The                            CF-HQ190L ([id number]) Olympus colonoscope was                            introduced through the anus and advanced to the the                            cecum, identified by appendiceal orifice and                            ileocecal valve. The colonoscopy was performed                            without difficulty. The patient tolerated the                            procedure well. The quality of the bowel                            preparation was good. The ileocecal valve,                            appendiceal orifice, and rectum were photographed. Scope In: 10:28:05 AM Scope Out: 11:08:51 AM Scope Withdrawal Time: 0 hours 33 minutes 22 seconds  Total Procedure Duration: 0 hours 40 minutes 46 seconds  Findings:      Five sessile and semi-pedunculated polyps were found in the rectum,       descending colon, transverse colon and ascending colon. The polyps were       3 to 10 mm in size. These polyps were removed with a cold snare.       Resection and retrieval were complete. jar 1.      Two pedunculated polyps were found in the ascending colon. The polyps       were 10 to 14 mm in size. These polyps were removed with a hot snare.       Resection and retrieval were complete. jar 2      A 19 mm polyp was found in the sigmoid colon. The polyp was       pedunculated. The polyp was removed with a hot snare. Resection and       retrieval were complete. I placed a single long endoclip across the       thick remnant stalk to reduce the risk of delayed polypectomy bleeding.       jar 3      Multiple small and large-mouthed diverticula were found in the left        colon.      No additional abnormalities were found on retroflexion. Impression:               - Five 3 to 10 mm polyps in the rectum, in the                            descending colon, in the transverse colon and in                            the ascending colon, removed with a cold snare.                            Resected and retrieved.                           - Two 10 to 14 mm polyps in the ascending colon,                            both removed with a hot snare. Resected and                            retrieved.                           - One 19 mm polyp in the sigmoid colon, removed                            with a hot snare. I placed a single long endoclip                            across the thick remnant stalk to reduce the risk                            of delayed polypectomy bleeding. Resected and  retrieved.                           - Diverticulosis in the left colon. Moderate Sedation:      Not Applicable - Patient had care per Anesthesia. Recommendation:           - Patient has a contact number available for                            emergencies. The signs and symptoms of potential                            delayed complications were discussed with the                            patient. Return to normal activities tomorrow.                            Written discharge instructions were provided to the                            patient.                           - Resume previous diet.                           - Continue present medications.                           - Await pathology results. Procedure Code(s):        --- Professional ---                           [id number], Colonoscopy, flexible; with removal of                            tumor(s), polyp(s), or other lesion(s) by snare                            technique Diagnosis Code(s):        --- Professional ---                           Z86.010, Personal history of colonic  polyps                           K62.1, Rectal polyp                           K63.5, Polyp of colon                           K57.30, Diverticulosis of large intestine without                            perforation or abscess without bleeding CPT copyright 2019 American Medical Association. All  rights reserved. The codes documented in this report are preliminary and upon coder review may  be revised to meet current compliance requirements. Milus Banister, MD 06/18/2020 11:17:48 AM This report has been signed electronically. Number of Addenda: 0

## 2020-06-18 NOTE — Discharge Instructions (Signed)

## 2020-06-18 NOTE — Interval H&P Note (Signed)
History and Physical Interval Note:  06/18/2020 9:34 AM  Charles Hart  has presented today for surgery, with the diagnosis of colon polyps.  The various methods of treatment have been discussed with the patient and family. After consideration of risks, benefits and other options for treatment, the patient has consented to  Procedure(s): COLONOSCOPY WITH PROPOFOL (N/A) as a surgical intervention.  The patient's history has been reviewed, patient examined, no change in status, stable for surgery.  I have reviewed the patient's chart and labs.  Questions were answered to the patient's satisfaction.     Milus Banister

## 2020-06-18 NOTE — Anesthesia Postprocedure Evaluation (Signed)
Anesthesia Post Note  Patient: Charles Hart  Procedure(s) Performed: COLONOSCOPY WITH PROPOFOL (N/A ) POLYPECTOMY     Patient location during evaluation: PACU Anesthesia Type: MAC Level of consciousness: awake and alert Pain management: pain level controlled Vital Signs Assessment: post-procedure vital signs reviewed and stable Respiratory status: spontaneous breathing, nonlabored ventilation, respiratory function stable and patient connected to nasal cannula oxygen Cardiovascular status: stable and blood pressure returned to baseline Postop Assessment: no apparent nausea or vomiting Anesthetic complications: no   No complications documented.  Last Vitals:  Vitals:   06/18/20 1140 06/18/20 1150  BP: (!) 168/79 (!) 188/74  Pulse: 68 61  Resp: (!) 23 (!) 21  Temp:    SpO2: 96% 97%    Last Pain:  Vitals:   06/18/20 1150  TempSrc:   PainSc: 0-No pain                 Katiejo Gilroy DAVID

## 2020-06-18 NOTE — Transfer of Care (Signed)
Immediate Anesthesia Transfer of Care Note  Patient: Charles Hart  Procedure(s) Performed: COLONOSCOPY WITH PROPOFOL (N/A ) POLYPECTOMY  Patient Location: PACU  Anesthesia Type:MAC  Level of Consciousness: sedated, patient cooperative and responds to stimulation  Airway & Oxygen Therapy: Patient Spontanous Breathing and Patient connected to face mask oxygen  Post-op Assessment: Report given to RN and Post -op Vital signs reviewed and stable  Post vital signs: Reviewed and stable  Last Vitals:  Vitals Value Taken Time  BP 115/50 06/18/20 1116  Temp    Pulse 72 06/18/20 1117  Resp 17 06/18/20 1117  SpO2 96 % 06/18/20 1117  Vitals shown include unvalidated device data.  Last Pain:  Vitals:   06/18/20 1000  TempSrc: Tympanic  PainSc: 0-No pain         Complications: No complications documented.

## 2020-06-18 NOTE — Anesthesia Preprocedure Evaluation (Addendum)
Anesthesia Evaluation  Patient identified by MRN, date of birth, ID band Patient awake    Reviewed: Allergy & Precautions, NPO status , Patient's Chart, lab work & pertinent test results  Airway Mallampati: I  TM Distance: >3 FB Neck ROM: Full    Dental   Pulmonary sleep apnea ,    Pulmonary exam normal        Cardiovascular hypertension, Pt. on medications + Past MI  Normal cardiovascular exam  ECHO 6/20 1. The left ventricle has moderate-severely reduced systolic function,  with an ejection fraction of 30-35%. The cavity size was mildly dilated.  There is moderate concentric left ventricular hypertrophy. Left  ventricular diastolic Doppler parameters are  consistent with impaired relaxation. Indeterminate filling pressures Left  ventrical global hypokinesis without regional wall motion abnormalities.  2. The right ventricle has normal systolic function. The cavity was  normal. There is no increase in right ventricular wall thickness.  3. The mitral valve is grossly normal. There is mild to moderate mitral  annular calcification present.  4. The tricuspid valve is grossly normal.  5. The aortic valve was not well visualized.  6. The aortic root is normal in size and structure.    Neuro/Psych CVA    GI/Hepatic   Endo/Other  diabetes  Renal/GU      Musculoskeletal   Abdominal   Peds  Hematology   Anesthesia Other Findings   Reproductive/Obstetrics                            Anesthesia Physical Anesthesia Plan  ASA: III  Anesthesia Plan: MAC   Post-op Pain Management:    Induction: Intravenous  PONV Risk Score and Plan: 1 and Treatment may vary due to age or medical condition  Airway Management Planned: Nasal Cannula  Additional Equipment:   Intra-op Plan:   Post-operative Plan:   Informed Consent: I have reviewed the patients History and Physical, chart, labs and  discussed the procedure including the risks, benefits and alternatives for the proposed anesthesia with the patient or authorized representative who has indicated his/her understanding and acceptance.       Plan Discussed with: CRNA and Surgeon  Anesthesia Plan Comments:         Anesthesia Quick Evaluation

## 2020-06-19 ENCOUNTER — Encounter (HOSPITAL_COMMUNITY): Payer: Self-pay | Admitting: Gastroenterology

## 2020-06-19 LAB — POCT I-STAT, CHEM 8
BUN: 43 mg/dL — ABNORMAL HIGH (ref 8–23)
Calcium, Ion: 1.11 mmol/L — ABNORMAL LOW (ref 1.15–1.40)
Chloride: 102 mmol/L (ref 98–111)
Creatinine, Ser: 10.3 mg/dL — ABNORMAL HIGH (ref 0.61–1.24)
Glucose, Bld: 90 mg/dL (ref 70–99)
HCT: 33 % — ABNORMAL LOW (ref 39.0–52.0)
Hemoglobin: 11.2 g/dL — ABNORMAL LOW (ref 13.0–17.0)
Potassium: 4.1 mmol/L (ref 3.5–5.1)
Sodium: 140 mmol/L (ref 135–145)
TCO2: 23 mmol/L (ref 22–32)

## 2020-06-19 LAB — SURGICAL PATHOLOGY

## 2020-06-23 ENCOUNTER — Ambulatory Visit: Payer: Self-pay | Admitting: General Surgery

## 2020-06-23 NOTE — H&P (Signed)
History of Present Illness Ralene Ok MD; 06/23/2020 2:40 PM) The patient is a 64 year old male who presents with an incisional hernia. Referred by: Dr. Heber Peggs Chief Complaint: Incisional hernia  Patient is a 64 year old male with a history of hypertension, end-stage renal disease on dialysis Tuesday Thursday Saturday, diabetes, who comes in with an incisional hernia. Patient underwent robotic-assisted left nephrectomy. This was by Dr. Bess Harvest in October 2019. Subsequent patient states that he noticed a bulge in the incision site several months thereafter. He states that he has no pain at the site currently. Patient wishes to be on the transplant list at [facility] in states they're require at the hernia repaired prior to being listed for transplant.  Patient currently works in food delivery. He's had no signs or symptoms of incarceration or strangulation.  Patient has had clearance by the internal medicine clinic.    Allergies (Chanel Teressa Senter, CMA; 06/23/2020 2:22 PM) Iodinated Contrast Media  HYDROcodone Bitartrate *CHEMICALS*  Phentermine HCl *CHEMICALS*  AmLODIPine & Diet Manage Prod *CALCIUM CHANNEL BLOCKERS*  Lisinopril *ANTIHYPERTENSIVES*  Dye FDC Red 40 (Carmine Red) *PHARMACEUTICAL ADJUVANTS*  Allergies Reconciled   Medication History (Chanel Teressa Senter, CMA; 06/23/2020 2:23 PM) Carvedilol (25MG  Tablet, Oral) Active. Furosemide (80MG  Tablet, Oral) Active. hydrALAZINE HCl (100MG  Tablet, Oral) Active. Pravastatin Sodium (40MG  Tablet, Oral) Active. Viagra (50MG  Tablet, Oral) Active. Flonase Allergy Relief (50MCG/ACT Suspension, Nasal) Active. Medications Reconciled    Review of Systems Ralene Ok, MD; 06/23/2020 2:42 PM) All other systems negative  Vitals (Chanel Nolan CMA; 06/23/2020 2:23 PM) 06/23/2020 2:23 PM Weight: 211.13 lb Height: 69in Body Surface Area: 2.11 m Body Mass Index: 31.18 kg/m  Temp.: 98.67F  Pulse: 94 (Regular)  BP:  130/74(Sitting, Left Arm, Standard)       Physical Exam Ralene Ok MD; 06/23/2020 2:41 PM) The physical exam findings are as follows: Note: Constitutional: No acute distress, conversant, appears stated age  Eyes: Anicteric sclerae, moist conjunctiva, no lid lag  Neck: No thyromegaly, trachea midline, no cervical lymphadenopathy  Lungs: Clear to auscultation biilaterally, normal respiratory effot  Cardiovascular: regular rate & rhythm, no murmurs, no peripheal edema, pedal pulses 2+  GI: Soft, no masses or hepatosplenomegaly, non-tender to palpation  MSK: Normal gait, no clubbing cyanosis, edema  Skin: No rashes, palpation reveals normal skin turgor  Psychiatric: Appropriate judgment and insight, oriented to person, place, and time  Abdomen Inspection Hernias - Incisional - Reducible (Approximate 5 cm in width) .    Assessment & Plan Ralene Ok MD; 06/23/2020 2:42 PM) INCISIONAL HERNIA, WITHOUT OBSTRUCTION OR GANGRENE (K43.2) Impression: Patient is a 64 year old male, with history of end-stage renal disease, hypertension, diabetes, comes in with an incisional hernia status post left nephrectomy.  1. The patient will like to proceed to the operating room for open incisional hernia repair with mesh.  2. I discussed with the patient the signs and symptoms of incarceration and strangulation and the need to proceed to the ER should they occur.  3. I discussed with the patient the risks and benefits of the procedure to include but not limited to: Infection, bleeding, damage to surrounding structures, possible need for further surgery, possible nerve pain, and possible recurrence. The patient was understanding and wishes to proceed.  I reviewed the patient's external notes from the referring physicians as well as consulting physician team. Each of the radiologic studies and lab studies were independently reviewed and interpreted. I discussed the results of the above  studies and how they relate to the patient's surgical  problems.

## 2020-07-13 ENCOUNTER — Telehealth: Payer: Self-pay | Admitting: Cardiovascular Disease

## 2020-07-13 DIAGNOSIS — Z01818 Encounter for other preprocedural examination: Secondary | ICD-10-CM

## 2020-07-13 DIAGNOSIS — I5022 Chronic systolic (congestive) heart failure: Secondary | ICD-10-CM

## 2020-07-13 DIAGNOSIS — I420 Dilated cardiomyopathy: Secondary | ICD-10-CM

## 2020-07-13 DIAGNOSIS — I10 Essential (primary) hypertension: Secondary | ICD-10-CM

## 2020-07-13 DIAGNOSIS — E785 Hyperlipidemia, unspecified: Secondary | ICD-10-CM

## 2020-07-13 NOTE — Telephone Encounter (Signed)
New Message  Charles Hart from [facility] is calling, she says she is needing results from a recent Echo and any type of stress test. She says she is going to fax a request for the tests, but wanted me to send a note for the request to his cardiologist as well.   Please advise

## 2020-07-15 NOTE — Telephone Encounter (Signed)
Left detailed message with Barrister's clerk at [facility]. Informed her that test have been ordered. Will send message to scheduling to help get patient an appointment.

## 2020-07-15 NOTE — Telephone Encounter (Signed)
Received fax from [facility] program requesting  Echocardiogram and nuclear medicine stress test to complete patient's evaluation for kidney transplant. Will forward to Dr. [name] for request for orders.

## 2020-07-15 NOTE — Telephone Encounter (Signed)
Ok to order both

## 2020-08-04 ENCOUNTER — Telehealth: Payer: Self-pay

## 2020-08-04 DIAGNOSIS — E785 Hyperlipidemia, unspecified: Secondary | ICD-10-CM

## 2020-08-04 DIAGNOSIS — Z01818 Encounter for other preprocedural examination: Secondary | ICD-10-CM

## 2020-08-04 NOTE — Telephone Encounter (Signed)
-----   Message from Frederic Jericho sent at 08/03/2020 11:45 AM EST ----- Regarding: PENDING ATT Just following up on ATTESTATION to be signed.[name]

## 2020-08-04 NOTE — Telephone Encounter (Signed)
Will forward to Dr. Nishan to sign. 

## 2020-08-12 ENCOUNTER — Other Ambulatory Visit: Payer: Self-pay

## 2020-08-12 DIAGNOSIS — I5042 Chronic combined systolic (congestive) and diastolic (congestive) heart failure: Secondary | ICD-10-CM

## 2020-08-13 ENCOUNTER — Ambulatory Visit (HOSPITAL_BASED_OUTPATIENT_CLINIC_OR_DEPARTMENT_OTHER)
Admission: RE | Admit: 2020-08-13 | Discharge: 2020-08-13 | Disposition: A | Discharge status: Home / Self Care | Payer: Medicare Other | Source: Ambulatory Visit | Attending: Cardiovascular Disease | Admitting: Cardiovascular Disease

## 2020-08-13 ENCOUNTER — Other Ambulatory Visit: Payer: Self-pay

## 2020-08-13 ENCOUNTER — Encounter (HOSPITAL_COMMUNITY)
Admission: RE | Admit: 2020-08-13 | Discharge: 2020-08-13 | Disposition: A | Discharge status: Home / Self Care | Payer: Medicare Other | Source: Ambulatory Visit | Attending: Cardiovascular Disease | Admitting: Cardiovascular Disease

## 2020-08-13 ENCOUNTER — Ambulatory Visit (HOSPITAL_COMMUNITY)
Admission: RE | Admit: 2020-08-13 | Discharge: 2020-08-13 | Disposition: A | Discharge status: Home / Self Care | Payer: Medicare Other | Source: Ambulatory Visit | Attending: Cardiovascular Disease | Admitting: Cardiovascular Disease

## 2020-08-13 ENCOUNTER — Encounter (HOSPITAL_COMMUNITY): Payer: Self-pay

## 2020-08-13 DIAGNOSIS — I5022 Chronic systolic (congestive) heart failure: Secondary | ICD-10-CM

## 2020-08-13 DIAGNOSIS — I420 Dilated cardiomyopathy: Secondary | ICD-10-CM | POA: Diagnosis not present

## 2020-08-13 DIAGNOSIS — Z01818 Encounter for other preprocedural examination: Secondary | ICD-10-CM

## 2020-08-13 DIAGNOSIS — I5042 Chronic combined systolic (congestive) and diastolic (congestive) heart failure: Secondary | ICD-10-CM | POA: Diagnosis not present

## 2020-08-13 DIAGNOSIS — I10 Essential (primary) hypertension: Secondary | ICD-10-CM

## 2020-08-13 DIAGNOSIS — E785 Hyperlipidemia, unspecified: Secondary | ICD-10-CM | POA: Diagnosis not present

## 2020-08-13 HISTORY — DX: Malignant (primary) neoplasm, unspecified: C80.1

## 2020-08-13 LAB — ECHOCARDIOGRAM COMPLETE
Area-P 1/2: 4.15 cm2
MV M vel: 5.36 m/s
MV Peak grad: 114.9 mmHg
S' Lateral: 5.55 cm

## 2020-08-13 LAB — NM MYOCAR MULTI W/SPECT W/WALL MOTION / EF
LV dias vol: 286 mL (ref 62–150)
LV sys vol: 175 mL
Peak HR: 109 {beats}/min
RATE: 0.37
Rest HR: 71 {beats}/min
SDS: 2
SRS: 7
SSS: 9
TID: 1

## 2020-08-13 MED ORDER — REGADENOSON 0.4 MG/5ML IV SOLN
INTRAVENOUS | Status: AC
  Administered 2020-08-13: 0.4 mg via INTRAVENOUS
  Filled 2020-08-13: qty 5

## 2020-08-13 MED ORDER — SODIUM CHLORIDE FLUSH 0.9 % IV SOLN
INTRAVENOUS | Status: AC
  Administered 2020-08-13: 10 mL via INTRAVENOUS
  Filled 2020-08-13: qty 10

## 2020-08-13 MED ORDER — TECHNETIUM TC 99M TETROFOSMIN IV KIT
30.0000 | PACK | Freq: Once | INTRAVENOUS | Status: AC | PRN
  Administered 2020-08-13: 31 via INTRAVENOUS

## 2020-08-13 MED ORDER — TECHNETIUM TC 99M TETROFOSMIN IV KIT
10.0000 | PACK | Freq: Once | INTRAVENOUS | Status: AC | PRN
  Administered 2020-08-13: 11 via INTRAVENOUS

## 2020-08-13 NOTE — Progress Notes (Signed)
*  PRELIMINARY RESULTS* Echocardiogram 2D Echocardiogram has been performed.  Leavy Cella 08/13/2020, 9:43 AM

## 2020-08-17 ENCOUNTER — Other Ambulatory Visit (HOSPITAL_COMMUNITY): Payer: Medicare Other

## 2020-08-17 ENCOUNTER — Encounter (HOSPITAL_COMMUNITY): Payer: Medicare Other

## 2020-08-24 ENCOUNTER — Telehealth: Payer: Self-pay

## 2020-08-24 NOTE — Telephone Encounter (Signed)
Received VM on lab phone from patient asking for return call from nurse. RTC to patient: The patient called to inquire whether he should receive a Covid vaccination.  The patient screened negative for a history of severe allergic reaction to any other vaccine or injectable therapy.  The patient was informed that he is eligible for the vaccine based on published contraindications and precautions.  The patient was encouraged to sign up for a vaccination appointment. SChaplin, RN,BSN

## 2020-08-24 NOTE — Telephone Encounter (Signed)
Agree, I strongly recommend the vaccine for him

## 2020-08-26 ENCOUNTER — Other Ambulatory Visit: Payer: Self-pay | Admitting: Internal Medicine

## 2020-09-04 ENCOUNTER — Telehealth: Payer: Self-pay

## 2020-09-04 NOTE — Telephone Encounter (Signed)
lmom for patient to call back and verify which office they prefer being seen at

## 2020-09-04 NOTE — Telephone Encounter (Signed)
-----   Message from Alvin Critchley sent at 09/01/2020  2:02 PM EST ----- This patient has a recall for [place] and an appt. Scheduled to see Nishan in [place] in March. Please call patient to verify which office he rather be seen in since he lives in [place]. Thanks, [employer]

## 2020-09-06 ENCOUNTER — Telehealth: Payer: Self-pay | Admitting: Cardiology

## 2020-09-06 NOTE — Telephone Encounter (Signed)
Patient called to return a message left by the office on 09/05/2019 concerning where he wanted his OV with Dr. [name] IN March.  I told him to call back during normal office hours to scheduled the appt

## 2020-09-07 NOTE — Telephone Encounter (Signed)
Spoke with patient he prefers [place] office , but there was no opening for [place] in March in [place] that's why he is schedule for [employer] [address]

## 2020-10-21 ENCOUNTER — Ambulatory Visit: Payer: Medicare Other | Admitting: Cardiovascular Disease

## 2020-10-25 NOTE — Progress Notes (Signed)
Cardiology Office Note:    Date:  11/06/2020   ID:  TEX CONROY, DOB 09-Oct-1955, MRN [id number]  PCP:  Lucious Groves, DO  Cardiologist:  [name], MD  Referring MD: Lucious Groves, DO   No chief complaint on file.   History of Present Illness:    Charles Hart is a 65 y.o. male with a past medical history significant for poorly controlled hypertension complicated by renal failure and dialysis as well as CVA in 2013.  He is noted to have nonischemic dilated cardiomyopathy with normal cath in 2012 in 2013.  He went through divorce in 2019.  He had renal cell carcinoma found on CT scan in 04/2018 and had left radical nephrectomy with no cardiac complications on 62/95/2841.  He gets hemodialysis on Tuesday/Thursday/Saturday.  He was turned down at [facility] for renal transplant.  He had indicated that 1 of his children may try to provide a kidney. However I suspect with his age and DCM he would not be a canddiate   Echocardiogram on 08/13/20 EF 35%   Not totally anuric and takes lasix Not on ARB/ACE/Entresto due to renal failure  Has a large ventral hernia from previous nephrectomy Having surgery Monday with Dr Rosendo Gros He is ok from cardiology standpoint to proceed    Past Medical History:  Diagnosis Date  . Adenomatous colon polyp 07/02/2011   Last colonoscopy May 06, 2011 by Dr. Owens Loffler, who recommended repeat colonoscopy in 5 years.   . Anemia   . Background diabetic retinopathy 04/20/2012   Patient is followed by Dr. Katy Fitch   . Cancer ([place])    Kidney  . Cardiomyopathy    LV function improved from 2004 to 2008.  Historically, moderately dilated LV with EF 30-40% by 2D echo 08/14/2002.  Mild CAD with severe LV dysfunction by cardiac cath 09/2002.  Normal coronary arteries and normal LV function by cardiac cath 09/19/2006.  A 2-D echo on 04/01/2009 showed mild concentric hypertrophy and normal systolic (LVEF  32-44%) and doppler C/W with grade 1 diastolic  dysfunction.  . CHF (congestive heart failure) ([place])    LV function improved from 2004 to 2008.  Historically, moderately dilated LV with EF 30-40% by 2D echo 08/14/2002.  Mild CAD with severe LV dysfunction by cardiac cath 09/2002.  Normal coronary arteries and normal LV function by cardiac cath 09/19/2006.  A 2-D echo on 04/01/2009 showed mild concentric hypertrophy and normal systolic (LVEF  01-02%) and doppler C/W with grade 1 diastolic dysfunction..   . Chronic combined systolic and diastolic congestive heart failure ([place]) 05/21/2010   LV function improved from 2004 to 2008.  Historically, moderately dilated LV with EF 30-40% by 2D echo 08/14/2002.  Mild CAD with severe LV dysfunction by cardiac cath 09/2002.  Normal coronary arteries and normal LV function by cardiac cath 09/19/2006.  A 2-D echo on 04/01/2009 showed mild concentric hypertrophy and normal systolic (LVEF  72-53%) and doppler parameters consistent with abnormal left   . CVA (cerebral vascular accident) ([place]) 07/04/2012   MRI of the brain 07/04/2012 showed an acute infarct in the right basal ganglia involving the anterior putamen, anterior limb internal capsule, and head of the caudate; this measured approximately 2.5 cm in diameter.     . Dermatitis   . Diabetes mellitus    type 2  . DIABETIC PERIPHERAL NEUROPATHY 08/03/2007   Qualifier: Diagnosis of  By: Marinda Elk MD, Sonia Side    . DM neuropathy, painful ([place])  fingers and right knee  . ESRD (end stage renal disease) ([place])    Stage 4, on hemodailysis x 4 months as of 05-18-18 [facility], tues thurs sat  . ESRD (end stage renal disease) on dialysis ([place])    "TTS; [facility]" (05/31/2018)  . Hearing loss in right ear   . Hyperlipidemia   . Hypertension   . Hypertensive crisis 07/28/2012  . Hypertensive urgency 08/20/2014  . Myocardial infarction [facility])  many yrs ago  . Nephrotic syndrome 02/18/2013   A 24-hour urine collection 03/04/2013 showed total protein of 5,460 g and  creatinine clearance of 80 mL/minute.  Patient was seen by Seward Meth at [place] and a repeat 24-hour urine showed 10,407 mg protein.  Patient underwent kidney biopsy on 05/30/2013; pathology showed advanced diffuse and nodular diabetic nephropathy with vascular changes consistent with long-standing difficult to control hypertension.     . Pneumonia 2014 and 2015  . Renal insufficiency   . Sleep apnea    uses cpap "sometimes per pt"    Past Surgical History:  Procedure Laterality Date  . AV FISTULA PLACEMENT Left 11/24/2017   Procedure: INSERTION OF ARTERIOVENOUS (AV) GORE-TEX GRAFT LEFT LOWER ARM;  Surgeon: Rosetta Posner, MD;  Location: [facility];  Service: Vascular;  Laterality: Left;  . CARDIAC CATHETERIZATION      4 times  . COLONOSCOPY    . COLONOSCOPY WITH PROPOFOL N/A 06/18/2020   Procedure: COLONOSCOPY WITH PROPOFOL;  Surgeon: Milus Banister, MD;  Location: [facility];  Service: Endoscopy;  Laterality: N/A;  . FOOT SURGERY    . POLYPECTOMY    . POLYPECTOMY  06/18/2020   Procedure: POLYPECTOMY;  Surgeon: Milus Banister, MD;  Location: [facility];  Service: Endoscopy;;  . ROBOT ASSISTED LAPAROSCOPIC NEPHRECTOMY Left 05/30/2018   Procedure: XI ROBOTIC ASSISTED LAPAROSCOPIC NEPHRECTOMY;  Surgeon: Alexis Frock, MD;  Location: [facility];  Service: Urology;  Laterality: Left;    Current Medications: Current Meds  Medication Sig  . calcium acetate (PHOSLO) 667 MG capsule Take 2,001 mg by mouth 3 (three) times daily with meals.   . carvedilol (COREG) 25 MG tablet Take 1 tablet (25 mg total) by mouth 2 (two) times daily with a meal.  . Cyanocobalamin (VITAMIN B-12 PO) Take 2,500 mcg by mouth daily.  . fluticasone (FLONASE) 50 MCG/ACT nasal spray Place 2 sprays into both nostrils as needed for allergies.  . folic acid-vitamin b complex-vitamin c-selenium-zinc (DIALYVITE) 3 MG TABS tablet Take 1 tablet by mouth daily.  . furosemide (LASIX) 80 MG  tablet Take 1 tablet (80 mg total) by mouth 2 (two) times daily.  [name] gabapentin (NEURONTIN) 300 MG capsule Take 300 mg by mouth at bedtime.  . hydrALAZINE (APRESOLINE) 100 MG tablet TAKE 1 TABLET(100 MG) BY MOUTH THREE TIMES DAILY (Patient taking differently: Take 100 mg by mouth 3 (three) times daily.)  . pravastatin (PRAVACHOL) 40 MG tablet Take 1 tablet (40 mg total) by mouth daily.  . SUPER B COMPLEX/C PO Take 1 tablet by mouth daily.     Allergies:   Ivp dye [iodinated diagnostic agents], Hydrocodone, Phentermine, Amlodipine, Lisinopril, and Red dye   Social History   Socioeconomic History  . Marital status: Divorced    Spouse name: Not on file  . Number of children: Not on file  . Years of education: Not on file  . Highest education level: Not on file  Occupational History  . Not on file  Tobacco Use  .  Smoking status: Never Smoker  . Smokeless tobacco: Never Used  Vaping Use  . Vaping Use: Never used  Substance and Sexual Activity  . Alcohol use: No    Alcohol/week: 0.0 standard drinks  . Drug use: No  . Sexual activity: Not on file  Other Topics Concern  . Not on file  Social History Narrative  . Not on file   Social Determinants of Health   Financial Resource Strain: Not on file  Food Insecurity: Not on file  Transportation Needs: Not on file  Physical Activity: Not on file  Stress: Not on file  Social Connections: Not on file     Family History: The patient's family history includes Aneurysm (age of onset: 41) in his father; Colon cancer in his sister. ROS:   Please see the history of present illness.     All other systems reviewed and are negative.  EKGs/Labs/Other Studies Reviewed:    The following studies were reviewed today:  Echocardiogram 02/06/2019 IMPRESSIONS  1. The left ventricle has moderate-severely reduced systolic function, with an ejection fraction of 30-35%. The cavity size was mildly dilated. There is moderate concentric left  ventricular hypertrophy. Left ventricular diastolic Doppler parameters are  consistent with impaired relaxation. Indeterminate filling pressures Left ventrical global hypokinesis without regional wall motion abnormalities.  2. The right ventricle has normal systolic function. The cavity was normal. There is no increase in right ventricular wall thickness.  3. The mitral valve is grossly normal. There is mild to moderate mitral annular calcification present.  4. The tricuspid valve is grossly normal.  5. The aortic valve was not well visualized.  6. The aortic root is normal in size and structure.  Echo Study Conclusions 01/2018 - Left ventricle: The cavity size was mildly dilated. Wall thickness was increased in a pattern of mild LVH. Systolic function was moderately reduced. The estimated ejection fraction was in the range of 35% to 40%. Diffuse hypokinesis. There was no evidence of elevated ventricular filling pressure by Doppler parameters. - Aorta: Aortic root dimension: 44 mm (ED). - Ascending aorta: The ascending aorta was mildly dilated. - Left atrium: The atrium was mildly dilated. - Right ventricle: The cavity size was moderately dilated. Wall thickness was normal. - Right atrium: The atrium was mildly dilated.  Impressions: - EF has mildly improved when compared to prior (25%).   Echo 02/23/17 Study Conclusions  - Left ventricle: The cavity size was severely dilated. Wall thickness was increased in a pattern of moderate LVH. Systolic function was severely reduced. The estimated ejection fraction was in the range of 25% to 30%. Diffuse hypokinesis. Features are consistent with a pseudonormal left ventricular filling pattern, with concomitant abnormal relaxation and increased filling pressure (grade 2 diastolic dysfunction). Doppler parameters are consistent with high ventricular filling pressure. - Aortic valve: There was trivial  regurgitation. - Aortic root: The aortic root was mildly dilated. - Mitral valve: Calcified annulus. There was mild regurgitation. - Left atrium: The atrium was moderately dilated. - Right atrium: The atrium was moderately dilated.  Impressions:  - Severe global reduction in LV systolic function; severe LVE; moderate LVH; moderate diastolic dysfunction with elevated LV filling pressure; mildly dilated aortic root; trace AI; mild MR; biatrial enlargement.  Carotids  07/02/18 :  Plaque no stenosis f/u 2 years   EKG:  05/12/20 SR rate 69 LVH  Recent Labs: 06/18/2020: BUN 43; Creatinine, Ser 10.30; Hemoglobin 11.2; Potassium 4.1; Sodium 140   Recent Lipid Panel    Component  Value Date/Time   CHOL 148 08/01/2019 0844   TRIG 137 08/01/2019 0844   HDL 33 (L) 08/01/2019 0844   CHOLHDL 4.5 08/01/2019 0844   CHOLHDL 4.7 11/04/2014 1202   VLDL 38 11/04/2014 1202   LDLCALC 90 08/01/2019 0844   LDLDIRECT 80 07/18/2012 1010    Physical Exam:    VS:  BP (!) 150/72   Pulse 94   Ht 5\' 9"  (1.753 m)   Wt 98.4 kg   SpO2 98%   BMI 32.05 kg/m     Wt Readings from Last 3 Encounters:  11/06/20 98.4 kg  06/18/20 94.3 kg  05/26/20 94.5 kg    Affect appropriate Healthy:  appears stated age 47: normal Neck supple with no adenopathy JVP normal no bruits no thyromegaly Lungs clear with no wheezing and good diaphragmatic motion Heart:  S1/S2 no murmur, no rub, gallop or click PMI normal Abdomen: benighn, BS positve, no tenderness, no AAA no bruit.  No HSM or HJR post nephrectomy large ventral hernia  Distal pulses intact with no bruits No edema Neuro non-focal Skin warm and dry LUE fistula with good thrill     PLAN:    In order of problems listed above:  CHF: -EF was down to 25-30% in 02/2017.  Patient placed on optimal medical therapy.  EF improved 35% most recent TTE 08/13/20  -Patient not on ACE/ARB/Entresto due to renal failure.  He continues on beta-blocker,  hydralazine. Also still on lasix 80 mg BID, still makes some urine. Fluid balance per dialysis.  ESRD -Patient on hemodialysis Tuesday/Thursday/Saturday. -He was previously turned down for renal transplant at [facility].  Per prior notes patient is hopeful to get a kidney from a family member.  His cardiomyopathy may prohibit this from occurring.  Hypertension -On amlodipine 5 mg, carvedilol 25 mg, hydralazine 100 mg 3 times daily  Hyperlipidemia -On pravastatin 40 mg daily. -Last lipid panel in December 2020 LDL 90   Renal Cell CA -Status post left radical nephrectomy.   Prior CVA -On chronic Plavix.  Carotid arteries noted with mild plaque, no significant stenosis. 06/15/20   Ventral Hernia:  Large less reducible and painful last 3 weeks Clear to have surgery Monday With Dr Rosendo Gros will need dialysis day after surgery He lives alone and willl likely need 4-5 day Hospital stay   F/U in a year   Signed, [name], MD  11/06/2020 9:03 AM    [place]

## 2020-11-06 ENCOUNTER — Encounter (HOSPITAL_COMMUNITY): Payer: Self-pay | Admitting: General Surgery

## 2020-11-06 ENCOUNTER — Ambulatory Visit (INDEPENDENT_AMBULATORY_CARE_PROVIDER_SITE_OTHER): Payer: 59 | Admitting: Cardiovascular Disease

## 2020-11-06 ENCOUNTER — Encounter: Payer: Self-pay | Admitting: Cardiovascular Disease

## 2020-11-06 ENCOUNTER — Other Ambulatory Visit: Payer: Self-pay

## 2020-11-06 ENCOUNTER — Other Ambulatory Visit (HOSPITAL_COMMUNITY)
Admission: RE | Admit: 2020-11-06 | Discharge: 2020-11-06 | Disposition: A | Discharge status: Home / Self Care | Payer: 59 | Source: Ambulatory Visit | Attending: General Surgery | Admitting: General Surgery

## 2020-11-06 VITALS — BP 150/72 | HR 94 | Ht 69.0 in | Wt 217.0 lb

## 2020-11-06 DIAGNOSIS — I5022 Chronic systolic (congestive) heart failure: Secondary | ICD-10-CM | POA: Diagnosis not present

## 2020-11-06 DIAGNOSIS — Z01812 Encounter for preprocedural laboratory examination: Secondary | ICD-10-CM | POA: Insufficient documentation

## 2020-11-06 DIAGNOSIS — Z20822 Contact with and (suspected) exposure to covid-19: Secondary | ICD-10-CM | POA: Insufficient documentation

## 2020-11-06 LAB — SARS CORONAVIRUS 2 (TAT 6-24 HRS): SARS Coronavirus 2: NEGATIVE

## 2020-11-06 NOTE — Patient Instructions (Signed)
Medication Instructions:  *If you need a refill on your cardiac medications before your next appointment, please call your pharmacy*  Lab Work: If you have labs (blood work) drawn today and your tests are completely normal, you will receive your results only by: [name] MyChart Message (if you have MyChart) OR . A paper copy in the mail If you have any lab test that is abnormal or we need to change your treatment, we will call you to review the results.  Follow-Up: At [facility], you and your health needs are our priority.  As part of our continuing mission to provide you with exceptional heart care, we have created designated Provider Care Teams.  These Care Teams include your primary Cardiologist (physician) and Advanced Practice Providers (APPs -  Physician Assistants and Nurse Practitioners) who all work together to provide you with the care you need, when you need it.  We recommend signing up for the patient portal called "MyChart".  Sign up information is provided on this After Visit Summary.  MyChart is used to connect with patients for Virtual Visits (Telemedicine).  Patients are able to view lab/test results, encounter notes, upcoming appointments, etc.  Non-urgent messages can be sent to your provider as well.   To learn more about what you can do with MyChart, go to [email or web address].    Your next appointment:   6 month(s)  The format for your next appointment:   In Person  Provider:   You may see [name], MD or one of the following Advanced Practice Providers on your designated Care Team:    Kathyrn Drown, NP   The doctor scheduled to do your surgery is Dr. Rosendo Gros.

## 2020-11-06 NOTE — Progress Notes (Signed)
Spoke with pt for pre-op call. Pt has a cardiac history, pt's cardiologist is Dr. [name]. Saw Dr. [name] today and cardiac clearance is noted. Pt has hx of type 2 Diabetes. A1C was 4.7 on 01/03/20. He states he does not check his blood sugar at home.  Covid test done today, result pending. Pt states he's been in quarantine since the test was done and understands that he stays in quarantine until he comes to the hospital on Monday. He will go to Dialysis tomorrow, but understands that he needs to wear his mask and social distance.

## 2020-11-07 ENCOUNTER — Ambulatory Visit: Payer: Self-pay | Admitting: General Surgery

## 2020-11-08 NOTE — H&P (Signed)
History of Present Illness Ralene Ok MD; 06/23/2020 2:40 PM) The patient is a 65 year old male who presents with an incisional hernia. Referred by: Dr. Heber [place] Chief Complaint: Incisional hernia  Patient is a 65 year old male with a history of hypertension, end-stage renal disease on dialysis Tuesday Thursday Saturday, diabetes, who comes in with an incisional hernia. Patient underwent robotic-assisted left nephrectomy. This was by Dr. Bess Harvest in October 2019. Subsequent patient states that he noticed a bulge in the incision site several months thereafter. He states that he has no pain at the site currently. Patient wishes to be on the transplant list at [facility] in states they're require at the hernia repaired prior to being listed for transplant.  Patient currently works in food delivery. He's had no signs or symptoms of incarceration or strangulation.  Patient has had clearance by the internal medicine clinic.    Allergies (Chanel Teressa Senter, CMA; 06/23/2020 2:22 PM) Iodinated Contrast Media  HYDROcodone Bitartrate *CHEMICALS*  Phentermine HCl *CHEMICALS*  AmLODIPine & Diet Manage Prod *CALCIUM CHANNEL BLOCKERS*  Lisinopril *ANTIHYPERTENSIVES*  Dye FDC Red 40 (Carmine Red) *PHARMACEUTICAL ADJUVANTS*  Allergies Reconciled   Medication History (Chanel Teressa Senter, CMA; 06/23/2020 2:23 PM) Carvedilol (25MG  Tablet, Oral) Active. Furosemide (80MG  Tablet, Oral) Active. hydrALAZINE HCl (100MG  Tablet, Oral) Active. Pravastatin Sodium (40MG  Tablet, Oral) Active. Viagra (50MG  Tablet, Oral) Active. Flonase Allergy Relief (50MCG/ACT Suspension, Nasal) Active. Medications Reconciled    Review of Systems Ralene Ok, MD; 06/23/2020 2:42 PM) All other systems negative  Vitals (Chanel Nolan CMA; 06/23/2020 2:23 PM) 06/23/2020 2:23 PM Weight: 211.13 lb Height: 69in Body Surface Area: 2.11 m Body Mass Index: 31.18 kg/m  Temp.: 98.37F  Pulse: 94 (Regular)   BP: 130/74(Sitting, Left Arm, Standard)       Physical Exam Ralene Ok MD; 06/23/2020 2:41 PM) The physical exam findings are as follows: Note: Constitutional: No acute distress, conversant, appears stated age  Eyes: Anicteric sclerae, moist conjunctiva, no lid lag  Neck: No thyromegaly, trachea midline, no cervical lymphadenopathy  Lungs: Clear to auscultation biilaterally, normal respiratory effot  Cardiovascular: regular rate & rhythm, no murmurs, no peripheal edema, pedal pulses 2+  GI: Soft, no masses or hepatosplenomegaly, non-tender to palpation  MSK: Normal gait, no clubbing cyanosis, edema  Skin: No rashes, palpation reveals normal skin turgor  Psychiatric: Appropriate judgment and insight, oriented to person, place, and time  Abdomen Inspection Hernias - Incisional - Reducible (Approximate 5 cm in width) .    Assessment & Plan Ralene Ok MD; 06/23/2020 2:42 PM) INCISIONAL HERNIA, WITHOUT OBSTRUCTION OR GANGRENE (K43.2) Impression: Patient is a 65 year old male, with history of end-stage renal disease, hypertension, diabetes, comes in with an incisional hernia status post left nephrectomy.  1. The patient will like to proceed to the operating room for open incisional hernia repair with mesh.  2. I discussed with the patient the signs and symptoms of incarceration and strangulation and the need to proceed to the ER should they occur.  3. I discussed with the patient the risks and benefits of the procedure to include but not limited to: Infection, bleeding, damage to surrounding structures, possible need for further surgery, possible nerve pain, and possible recurrence. The patient was understanding and wishes to proceed.  I reviewed the patient's external notes from the referring physicians as well as consulting physician team. Each of the radiologic studies and lab studies were independently reviewed and interpreted. I  discussed the results of the above studies and how they relate to the patient's surgical  problems.

## 2020-11-08 NOTE — Anesthesia Preprocedure Evaluation (Addendum)
Anesthesia Evaluation  Patient identified by MRN, date of birth, ID band Patient awake    Reviewed: Allergy & Precautions, NPO status , Patient's Chart, lab work & pertinent test results, reviewed documented beta blocker date and time   Airway Mallampati: III  TM Distance: >3 FB Neck ROM: Full    Dental  (+) Edentulous Upper, Edentulous Lower, Dental Advisory Given   Pulmonary sleep apnea (no CPAP) ,    Pulmonary exam normal breath sounds clear to auscultation       Cardiovascular hypertension, Pt. on medications and Pt. on home beta blockers + Past MI and +CHF  Normal cardiovascular exam Rhythm:Regular Rate:Normal  TTE 2021 1. Apical windows are foreshortened. Compared to previous echo report, no  significant change in LV systolic function. . Left ventricular ejection  fraction, by estimation, is 35%. The left ventricle has moderately  decreased function. The left ventricle  demonstrates global hypokinesis. The left ventricular internal cavity size  was severely dilated. There is mild left ventricular hypertrophy. Left  ventricular diastolic parameters are consistent with Grade I diastolic  dysfunction (impaired relaxation).  2. Right ventricular systolic function is normal. The right ventricular  size is normal.  3. The mitral valve is abnormal. Trivial mitral valve regurgitation.  4. The aortic valve is abnormal. Aortic valve regurgitation is not  visualized. Mild aortic valve sclerosis is present, with no evidence of  aortic valve stenosis.  5. Aortic dilatation noted. There is mild dilatation of the aortic root,  measuring 42 mm.  6. The inferior vena cava is normal in size with greater than 50%  respiratory variability, suggesting right atrial pressure of 3 mmHg   Neuro/Psych CVA, No Residual Symptoms negative psych ROS   GI/Hepatic negative GI ROS, Neg liver ROS,   Endo/Other  diabetes (BG 91), Type 2   Renal/GU ESRF and DialysisRenal disease (dialysis TTS, had dialysis on saturday without problems. Left arm fistula)  negative genitourinary   Musculoskeletal negative musculoskeletal ROS (+)   Abdominal   Peds  Hematology negative hematology ROS (+)   Anesthesia Other Findings   Reproductive/Obstetrics                           Anesthesia Physical Anesthesia Plan  ASA: III  Anesthesia Plan: General   Post-op Pain Management:    Induction: Intravenous  PONV Risk Score and Plan: 2 and Midazolam, Dexamethasone and Ondansetron  Airway Management Planned: Oral ETT  Additional Equipment:   Intra-op Plan:   Post-operative Plan: Extubation in OR  Informed Consent: I have reviewed the patients History and Physical, chart, labs and discussed the procedure including the risks, benefits and alternatives for the proposed anesthesia with the patient or authorized representative who has indicated his/her understanding and acceptance.     Dental advisory given  Plan Discussed with: CRNA  Anesthesia Plan Comments:        Anesthesia Quick Evaluation

## 2020-11-09 ENCOUNTER — Ambulatory Visit (HOSPITAL_COMMUNITY): Payer: 59 | Admitting: Anesthesiology

## 2020-11-09 ENCOUNTER — Encounter (HOSPITAL_COMMUNITY): Payer: Self-pay | Admitting: General Surgery

## 2020-11-09 ENCOUNTER — Other Ambulatory Visit: Payer: Self-pay

## 2020-11-09 ENCOUNTER — Encounter (HOSPITAL_COMMUNITY)
Admission: RE | Disposition: A | Discharge status: Skilled Nursing Facility | Payer: Self-pay | Source: Home / Self Care | Attending: General Surgery

## 2020-11-09 ENCOUNTER — Inpatient Hospital Stay (HOSPITAL_COMMUNITY)
Admission: RE | Admit: 2020-11-09 | Discharge: 2020-11-19 | DRG: 353 | Disposition: A | Discharge status: Skilled Nursing Facility | Payer: 59 | Attending: General Surgery | Admitting: General Surgery

## 2020-11-09 DIAGNOSIS — I2510 Atherosclerotic heart disease of native coronary artery without angina pectoris: Secondary | ICD-10-CM | POA: Diagnosis present

## 2020-11-09 DIAGNOSIS — I252 Old myocardial infarction: Secondary | ICD-10-CM

## 2020-11-09 DIAGNOSIS — Z800 Family history of malignant neoplasm of digestive organs: Secondary | ICD-10-CM

## 2020-11-09 DIAGNOSIS — I132 Hypertensive heart and chronic kidney disease with heart failure and with stage 5 chronic kidney disease, or end stage renal disease: Secondary | ICD-10-CM | POA: Diagnosis present

## 2020-11-09 DIAGNOSIS — Z0189 Encounter for other specified special examinations: Secondary | ICD-10-CM

## 2020-11-09 DIAGNOSIS — Z8673 Personal history of transient ischemic attack (TIA), and cerebral infarction without residual deficits: Secondary | ICD-10-CM

## 2020-11-09 DIAGNOSIS — E785 Hyperlipidemia, unspecified: Secondary | ICD-10-CM | POA: Diagnosis present

## 2020-11-09 DIAGNOSIS — I5042 Chronic combined systolic (congestive) and diastolic (congestive) heart failure: Secondary | ICD-10-CM | POA: Diagnosis present

## 2020-11-09 DIAGNOSIS — N2581 Secondary hyperparathyroidism of renal origin: Secondary | ICD-10-CM | POA: Diagnosis present

## 2020-11-09 DIAGNOSIS — N186 End stage renal disease: Secondary | ICD-10-CM | POA: Diagnosis present

## 2020-11-09 DIAGNOSIS — E8889 Other specified metabolic disorders: Secondary | ICD-10-CM | POA: Diagnosis present

## 2020-11-09 DIAGNOSIS — Z87441 Personal history of nephrotic syndrome: Secondary | ICD-10-CM

## 2020-11-09 DIAGNOSIS — M62838 Other muscle spasm: Secondary | ICD-10-CM | POA: Diagnosis present

## 2020-11-09 DIAGNOSIS — Z85528 Personal history of other malignant neoplasm of kidney: Secondary | ICD-10-CM

## 2020-11-09 DIAGNOSIS — D631 Anemia in chronic kidney disease: Secondary | ICD-10-CM | POA: Diagnosis present

## 2020-11-09 DIAGNOSIS — Z20822 Contact with and (suspected) exposure to covid-19: Secondary | ICD-10-CM | POA: Diagnosis present

## 2020-11-09 DIAGNOSIS — K432 Incisional hernia without obstruction or gangrene: Principal | ICD-10-CM | POA: Diagnosis present

## 2020-11-09 DIAGNOSIS — Z9889 Other specified postprocedural states: Secondary | ICD-10-CM

## 2020-11-09 DIAGNOSIS — E11319 Type 2 diabetes mellitus with unspecified diabetic retinopathy without macular edema: Secondary | ICD-10-CM | POA: Diagnosis present

## 2020-11-09 DIAGNOSIS — Z992 Dependence on renal dialysis: Secondary | ICD-10-CM

## 2020-11-09 DIAGNOSIS — Z79899 Other long term (current) drug therapy: Secondary | ICD-10-CM

## 2020-11-09 DIAGNOSIS — E1142 Type 2 diabetes mellitus with diabetic polyneuropathy: Secondary | ICD-10-CM | POA: Diagnosis present

## 2020-11-09 DIAGNOSIS — R0602 Shortness of breath: Secondary | ICD-10-CM

## 2020-11-09 DIAGNOSIS — Z8719 Personal history of other diseases of the digestive system: Secondary | ICD-10-CM

## 2020-11-09 DIAGNOSIS — E1122 Type 2 diabetes mellitus with diabetic chronic kidney disease: Secondary | ICD-10-CM | POA: Diagnosis present

## 2020-11-09 DIAGNOSIS — Z905 Acquired absence of kidney: Secondary | ICD-10-CM

## 2020-11-09 HISTORY — PX: INCISIONAL HERNIA REPAIR: SHX193

## 2020-11-09 HISTORY — PX: INSERTION OF MESH: SHX5868

## 2020-11-09 LAB — POCT I-STAT, CHEM 8
BUN: 49 mg/dL — ABNORMAL HIGH (ref 8–23)
Calcium, Ion: 1.08 mmol/L — ABNORMAL LOW (ref 1.15–1.40)
Chloride: 98 mmol/L (ref 98–111)
Creatinine, Ser: 11.3 mg/dL — ABNORMAL HIGH (ref 0.61–1.24)
Glucose, Bld: 91 mg/dL (ref 70–99)
HCT: 38 % — ABNORMAL LOW (ref 39.0–52.0)
Hemoglobin: 12.9 g/dL — ABNORMAL LOW (ref 13.0–17.0)
Potassium: 4.1 mmol/L (ref 3.5–5.1)
Sodium: 137 mmol/L (ref 135–145)
TCO2: 26 mmol/L (ref 22–32)

## 2020-11-09 LAB — GLUCOSE, CAPILLARY: Glucose-Capillary: 125 mg/dL — ABNORMAL HIGH (ref 70–99)

## 2020-11-09 SURGERY — REPAIR, HERNIA, INCISIONAL
Anesthesia: General | Site: Abdomen

## 2020-11-09 MED ORDER — LACTATED RINGERS IV SOLN: INTRAVENOUS | Status: DC

## 2020-11-09 MED ORDER — ROCURONIUM BROMIDE 10 MG/ML (PF) SYRINGE
PREFILLED_SYRINGE | INTRAVENOUS | Status: AC
  Filled 2020-11-09: qty 10

## 2020-11-09 MED ORDER — VISTASEAL 10 ML SINGLE DOSE KIT
PACK | CUTANEOUS | Status: DC | PRN
  Administered 2020-11-09: 10 mL via TOPICAL

## 2020-11-09 MED ORDER — 0.9 % SODIUM CHLORIDE (POUR BTL) OPTIME
TOPICAL | Status: DC | PRN
  Administered 2020-11-09: 1000 mL

## 2020-11-09 MED ORDER — ONDANSETRON 4 MG PO TBDP: 4.0000 mg | ORAL_TABLET | Freq: Four times a day (QID) | ORAL | Status: DC | PRN

## 2020-11-09 MED ORDER — FLUTICASONE PROPIONATE 50 MCG/ACT NA SUSP
2.0000 | Freq: Every day | NASAL | Status: DC | PRN
  Filled 2020-11-09: qty 16

## 2020-11-09 MED ORDER — ROCURONIUM BROMIDE 10 MG/ML (PF) SYRINGE
PREFILLED_SYRINGE | INTRAVENOUS | Status: DC | PRN
  Administered 2020-11-09: 70 mg via INTRAVENOUS
  Administered 2020-11-09: 30 mg via INTRAVENOUS

## 2020-11-09 MED ORDER — FENTANYL CITRATE (PF) 100 MCG/2ML IJ SOLN
INTRAMUSCULAR | Status: DC | PRN
  Administered 2020-11-09 (×2): 50 ug via INTRAVENOUS

## 2020-11-09 MED ORDER — HYDRALAZINE HCL 50 MG PO TABS
100.0000 mg | ORAL_TABLET | Freq: Three times a day (TID) | ORAL | Status: DC
  Administered 2020-11-09 – 2020-11-16 (×17): 100 mg via ORAL
  Filled 2020-11-09 (×21): qty 2

## 2020-11-09 MED ORDER — CHLORHEXIDINE GLUCONATE CLOTH 2 % EX PADS: 6.0000 | MEDICATED_PAD | Freq: Once | CUTANEOUS | Status: DC

## 2020-11-09 MED ORDER — PROPOFOL 10 MG/ML IV BOLUS
INTRAVENOUS | Status: AC
  Filled 2020-11-09: qty 40

## 2020-11-09 MED ORDER — GABAPENTIN 300 MG PO CAPS
300.0000 mg | ORAL_CAPSULE | Freq: Every day | ORAL | Status: DC
  Administered 2020-11-09 – 2020-11-18 (×9): 300 mg via ORAL
  Filled 2020-11-09 (×10): qty 1

## 2020-11-09 MED ORDER — FENTANYL CITRATE (PF) 100 MCG/2ML IJ SOLN
INTRAMUSCULAR | Status: AC
  Filled 2020-11-09: qty 2

## 2020-11-09 MED ORDER — EPHEDRINE SULFATE-NACL 50-0.9 MG/10ML-% IV SOSY
PREFILLED_SYRINGE | INTRAVENOUS | Status: DC | PRN
  Administered 2020-11-09 (×2): 10 mg via INTRAVENOUS
  Administered 2020-11-09: 7.5 mg via INTRAVENOUS

## 2020-11-09 MED ORDER — ONDANSETRON HCL 4 MG/2ML IJ SOLN
INTRAMUSCULAR | Status: AC
  Filled 2020-11-09: qty 2

## 2020-11-09 MED ORDER — SODIUM CHLORIDE 0.9 % IV SOLN
INTRAVENOUS | Status: DC | PRN
  Administered 2020-11-09: 40 mL

## 2020-11-09 MED ORDER — PRAVASTATIN SODIUM 40 MG PO TABS
40.0000 mg | ORAL_TABLET | Freq: Every day | ORAL | Status: DC
  Administered 2020-11-09 – 2020-11-18 (×9): 40 mg via ORAL
  Filled 2020-11-09 (×9): qty 1

## 2020-11-09 MED ORDER — ONDANSETRON HCL 4 MG/2ML IJ SOLN
INTRAMUSCULAR | Status: DC | PRN
  Administered 2020-11-09: 4 mg via INTRAVENOUS

## 2020-11-09 MED ORDER — VISTASEAL 10 ML SINGLE DOSE KIT
10.0000 mL | PACK | Freq: Once | CUTANEOUS | Status: DC
  Filled 2020-11-09: qty 10

## 2020-11-09 MED ORDER — CARVEDILOL 12.5 MG PO TABS
25.0000 mg | ORAL_TABLET | Freq: Once | ORAL | Status: AC
  Administered 2020-11-09: 25 mg via ORAL
  Filled 2020-11-09: qty 2

## 2020-11-09 MED ORDER — BUPIVACAINE LIPOSOME 1.3 % IJ SUSP
INTRAMUSCULAR | Status: AC
  Filled 2020-11-09: qty 20

## 2020-11-09 MED ORDER — EPHEDRINE 5 MG/ML INJ
INTRAVENOUS | Status: AC
  Filled 2020-11-09: qty 10

## 2020-11-09 MED ORDER — PHENYLEPHRINE HCL-NACL 10-0.9 MG/250ML-% IV SOLN
INTRAVENOUS | Status: DC | PRN
  Administered 2020-11-09: 30 ug/min via INTRAVENOUS

## 2020-11-09 MED ORDER — SUGAMMADEX SODIUM 200 MG/2ML IV SOLN
INTRAVENOUS | Status: DC | PRN
  Administered 2020-11-09: 380 mg via INTRAVENOUS

## 2020-11-09 MED ORDER — OXYCODONE HCL 5 MG PO TABS
5.0000 mg | ORAL_TABLET | ORAL | Status: DC | PRN
  Administered 2020-11-09 (×2): 10 mg via ORAL
  Administered 2020-11-10: 5 mg via ORAL
  Administered 2020-11-10: 10 mg via ORAL
  Administered 2020-11-10: 5 mg via ORAL
  Administered 2020-11-10: 10 mg via ORAL
  Administered 2020-11-11 (×2): 5 mg via ORAL
  Administered 2020-11-11: 10 mg via ORAL
  Administered 2020-11-11: 5 mg via ORAL
  Filled 2020-11-09 (×3): qty 1
  Filled 2020-11-09 (×4): qty 2
  Filled 2020-11-09 (×2): qty 1
  Filled 2020-11-09 (×2): qty 2

## 2020-11-09 MED ORDER — DEXAMETHASONE SODIUM PHOSPHATE 10 MG/ML IJ SOLN
INTRAMUSCULAR | Status: AC
  Filled 2020-11-09: qty 1

## 2020-11-09 MED ORDER — FENTANYL CITRATE (PF) 250 MCG/5ML IJ SOLN
INTRAMUSCULAR | Status: AC
  Filled 2020-11-09: qty 5

## 2020-11-09 MED ORDER — LIDOCAINE 2% (20 MG/ML) 5 ML SYRINGE
INTRAMUSCULAR | Status: DC | PRN
  Administered 2020-11-09: 60 mg via INTRAVENOUS

## 2020-11-09 MED ORDER — ACETAMINOPHEN 10 MG/ML IV SOLN
1000.0000 mg | Freq: Four times a day (QID) | INTRAVENOUS | Status: AC
  Administered 2020-11-09 – 2020-11-10 (×4): 1000 mg via INTRAVENOUS
  Filled 2020-11-09 (×4): qty 100

## 2020-11-09 MED ORDER — CARVEDILOL 25 MG PO TABS
25.0000 mg | ORAL_TABLET | Freq: Two times a day (BID) | ORAL | Status: DC
Start: 2020-11-09 — End: 2020-11-19
  Administered 2020-11-09 – 2020-11-18 (×17): 25 mg via ORAL
  Filled 2020-11-09 (×18): qty 1

## 2020-11-09 MED ORDER — LIDOCAINE 2% (20 MG/ML) 5 ML SYRINGE
INTRAMUSCULAR | Status: AC
  Filled 2020-11-09: qty 5

## 2020-11-09 MED ORDER — PHENYLEPHRINE 40 MCG/ML (10ML) SYRINGE FOR IV PUSH (FOR BLOOD PRESSURE SUPPORT)
PREFILLED_SYRINGE | INTRAVENOUS | Status: DC | PRN
  Administered 2020-11-09: 80 ug via INTRAVENOUS
  Administered 2020-11-09: 120 ug via INTRAVENOUS
  Administered 2020-11-09: 40 ug via INTRAVENOUS
  Administered 2020-11-09: 160 ug via INTRAVENOUS

## 2020-11-09 MED ORDER — DEXAMETHASONE SODIUM PHOSPHATE 10 MG/ML IJ SOLN
INTRAMUSCULAR | Status: DC | PRN
  Administered 2020-11-09: 8 mg via INTRAVENOUS

## 2020-11-09 MED ORDER — ORAL CARE MOUTH RINSE: 15.0000 mL | Freq: Once | OROMUCOSAL | Status: AC

## 2020-11-09 MED ORDER — CHLORHEXIDINE GLUCONATE 0.12 % MT SOLN
15.0000 mL | Freq: Once | OROMUCOSAL | Status: AC
  Administered 2020-11-09: 15 mL via OROMUCOSAL
  Filled 2020-11-09: qty 15

## 2020-11-09 MED ORDER — ACETAMINOPHEN 500 MG PO TABS
1000.0000 mg | ORAL_TABLET | Freq: Once | ORAL | Status: AC
  Administered 2020-11-09: 1000 mg via ORAL
  Filled 2020-11-09: qty 2

## 2020-11-09 MED ORDER — SODIUM CHLORIDE 0.9 % IV SOLN: INTRAVENOUS | Status: DC | PRN

## 2020-11-09 MED ORDER — FENTANYL CITRATE (PF) 100 MCG/2ML IJ SOLN
25.0000 ug | INTRAMUSCULAR | Status: DC | PRN
  Administered 2020-11-09 (×2): 50 ug via INTRAVENOUS
  Administered 2020-11-09: 25 ug via INTRAVENOUS

## 2020-11-09 MED ORDER — MIDAZOLAM HCL 2 MG/2ML IJ SOLN
INTRAMUSCULAR | Status: AC
  Filled 2020-11-09: qty 2

## 2020-11-09 MED ORDER — CEFAZOLIN SODIUM-DEXTROSE 2-4 GM/100ML-% IV SOLN
2.0000 g | INTRAVENOUS | Status: AC
  Administered 2020-11-09: 2 g via INTRAVENOUS
  Filled 2020-11-09: qty 100

## 2020-11-09 MED ORDER — KETAMINE HCL 50 MG/5ML IJ SOSY
PREFILLED_SYRINGE | INTRAMUSCULAR | Status: AC
  Filled 2020-11-09: qty 5

## 2020-11-09 MED ORDER — FUROSEMIDE 80 MG PO TABS
80.0000 mg | ORAL_TABLET | Freq: Two times a day (BID) | ORAL | Status: DC
  Administered 2020-11-09 – 2020-11-10 (×2): 80 mg via ORAL
  Filled 2020-11-09 (×2): qty 1

## 2020-11-09 MED ORDER — KETAMINE HCL 10 MG/ML IJ SOLN
INTRAMUSCULAR | Status: DC | PRN
  Administered 2020-11-09: 20 mg via INTRAVENOUS
  Administered 2020-11-09: 10 mg via INTRAVENOUS

## 2020-11-09 MED ORDER — DEXTROSE-NACL 5-0.9 % IV SOLN: INTRAVENOUS | Status: DC

## 2020-11-09 MED ORDER — MIDAZOLAM HCL 2 MG/2ML IJ SOLN
INTRAMUSCULAR | Status: DC | PRN
  Administered 2020-11-09: 2 mg via INTRAVENOUS

## 2020-11-09 MED ORDER — PROPOFOL 10 MG/ML IV BOLUS
INTRAVENOUS | Status: DC | PRN
  Administered 2020-11-09: 130 mg via INTRAVENOUS

## 2020-11-09 MED ORDER — ONDANSETRON HCL 4 MG/2ML IJ SOLN
4.0000 mg | Freq: Four times a day (QID) | INTRAMUSCULAR | Status: DC | PRN
  Administered 2020-11-09 – 2020-11-13 (×4): 4 mg via INTRAVENOUS
  Filled 2020-11-09 (×4): qty 2

## 2020-11-09 SURGICAL SUPPLY — 47 items
BINDER ABDOMINAL 12 ML 46-62 (SOFTGOODS) ×2 IMPLANT
BLADE CLIPPER SURG (BLADE) ×2 IMPLANT
BLADE SURG 11 STRL SS (BLADE) ×2 IMPLANT
COVER SURGICAL LIGHT HANDLE (MISCELLANEOUS) ×2 IMPLANT
COVER WAND RF STERILE (DRAPES) IMPLANT
DERMABOND ADVANCED (GAUZE/BANDAGES/DRESSINGS) ×1
DERMABOND ADVANCED .7 DNX12 (GAUZE/BANDAGES/DRESSINGS) ×1 IMPLANT
DEVICE TROCAR PUNCTURE CLOSURE (ENDOMECHANICALS) ×2 IMPLANT
DRAIN CHANNEL 19F RND (DRAIN) IMPLANT
DRAPE INCISE IOBAN 66X45 STRL (DRAPES) ×2 IMPLANT
DRAPE LAPAROSCOPIC ABDOMINAL (DRAPES) ×2 IMPLANT
DRSG OPSITE POSTOP 4X8 (GAUZE/BANDAGES/DRESSINGS) IMPLANT
DRSG PAD ABDOMINAL 8X10 ST (GAUZE/BANDAGES/DRESSINGS) ×2 IMPLANT
ELECT REM PT RETURN 9FT ADLT (ELECTROSURGICAL) ×2
ELECTRODE REM PT RTRN 9FT ADLT (ELECTROSURGICAL) ×1 IMPLANT
EVACUATOR SILICONE 100CC (DRAIN) IMPLANT
GLOVE BIO SURGEON STRL SZ7.5 (GLOVE) ×2 IMPLANT
GLOVE SRG 8 PF TXTR STRL LF DI (GLOVE) ×1 IMPLANT
GLOVE SURG UNDER POLY LF SZ8 (GLOVE) ×2
GOWN STRL REUS W/ TWL LRG LVL3 (GOWN DISPOSABLE) ×1 IMPLANT
GOWN STRL REUS W/ TWL XL LVL3 (GOWN DISPOSABLE) ×1 IMPLANT
GOWN STRL REUS W/TWL LRG LVL3 (GOWN DISPOSABLE) ×2
GOWN STRL REUS W/TWL XL LVL3 (GOWN DISPOSABLE) ×2
KIT BASIN OR (CUSTOM PROCEDURE TRAY) ×2 IMPLANT
KIT TURNOVER KIT B (KITS) ×2 IMPLANT
MARKER SKIN DUAL TIP RULER LAB (MISCELLANEOUS) ×2 IMPLANT
MESH PROLENE PML 12X12 (Mesh General) ×2 IMPLANT
NEEDLE HYPO 22GX1.5 SAFETY (NEEDLE) ×2 IMPLANT
NS IRRIG 1000ML POUR BTL (IV SOLUTION) ×2 IMPLANT
PACK GENERAL/GYN (CUSTOM PROCEDURE TRAY) ×2 IMPLANT
PAD ARMBOARD 7.5X6 YLW CONV (MISCELLANEOUS) ×4 IMPLANT
PENCIL SMOKE EVACUATOR (MISCELLANEOUS) ×2 IMPLANT
RETAINER VISCERA MED (MISCELLANEOUS) ×2 IMPLANT
SPONGE LAP 18X18 RF (DISPOSABLE) ×2 IMPLANT
STAPLER VISISTAT 35W (STAPLE) IMPLANT
SUT ETHILON 3 0 FSL (SUTURE) IMPLANT
SUT MNCRL AB 3-0 PS2 18 (SUTURE) ×4 IMPLANT
SUT NOVA NAB GS-21 0 18 T12 DT (SUTURE) ×4 IMPLANT
SUT PDS AB 0 CT 36 (SUTURE) IMPLANT
SUT PDS AB 1 TP1 54 (SUTURE) ×8 IMPLANT
SUT VICRYL AB 2 0 TIES (SUTURE) IMPLANT
SYR CONTROL 10ML LL (SYRINGE) ×2 IMPLANT
TOWEL GREEN STERILE (TOWEL DISPOSABLE) ×2 IMPLANT
TOWEL GREEN STERILE FF (TOWEL DISPOSABLE) ×2 IMPLANT
TRAY FOLEY W/BAG SLVR 16FR (SET/KITS/TRAYS/PACK)
TRAY FOLEY W/BAG SLVR 16FR ST (SET/KITS/TRAYS/PACK) IMPLANT
WATER STERILE IRR 1000ML POUR (IV SOLUTION) ×2 IMPLANT

## 2020-11-09 NOTE — Interval H&P Note (Signed)
History and Physical Interval Note:  11/09/2020 7:22 AM  Charles Hart  has presented today for surgery, with the diagnosis of INCISIONAL HERNIA.  The various methods of treatment have been discussed with the patient and family. After consideration of risks, benefits and other options for treatment, the patient has consented to  Procedure(s): OPEN INCISIONAL HERNIA REPAIR WITH MESH (N/A) as a surgical intervention.  The patient's history has been reviewed, patient examined, no change in status, stable for surgery.  I have reviewed the patient's chart and labs.  Questions were answered to the patient's satisfaction.     Ralene Ok

## 2020-11-09 NOTE — Op Note (Signed)
Operative Report  Pre Operative Diagnosis:  Incisional ventral hernia   Post Operative Diagnosis: Same   Procedure: Open retrorectus hernia repair with mesh   Surgeon: Dr. Ralene Ok  Surgery Resident: Dr. Jerilee Hoh   Anesthesia: GETA   EBL: minimal   Complications: none   Counts: reported as correct x 2  Findings: Midline incisional hernia containing small bowel, easily reducible with minimal adhesions and no evidence of strangulation; repaired using retrorectus hernia repair with prolene mesh   Indications for procedure:  65 y.o. man with history ESRD and robot-assisted nephrectomy who has an incisional ventral hernia. He is being taken to the OR today for open incisional hernia repair with mesh  Procedure details:  After the patient was consented he was taken back to the operating room and placed in the supine position with bilateral SCDs in place. The patient was prepped and draped in usual sterile fashion. Antibiotics were confirmed and timeout was called and all facts verified.  A #10 blade was used to make a midline incision.  I proceeded to use electrocautery to maintain hemostasis and dissection took place in the most superior portion of the wound down to the subcutaneous tissues fat to the midline fascia and hernia sac. This was incised. The fascia was elevated and cautery was used to help with hemostasis.  The peritoneum was entered. There were several loops of bowel in the hernia sac which appeared healthy and easily reducible. There were no significant  interloop adhesions and only minimal adhesions between the small bowel and inferior portion of the hernia sac, which were taken down. The hernia sac was then circumfrentially dissected away. The fascia was incised to the rest of incision.  The fascia was elevated and the posterior rectus fascia was incised. The rectus muscle was then dissected away from the posterior rectus fascia until we reached the perforators  laterally. This was done bilaterally. This easily allowed the posterior rectus sheath to be advanced bilaterally to the midline. The posterior midline fascia was then reapproximated with a 2-0 PDS in a standard running fashion. The area was irrigated out.  Hemostasis was achieved.  The retrorectus space then measured approximately 12 cm x 15 cm. A piece of Ethicon prolene 30cm x30cm mesh was opened and trimmed to fit easily into this space. The mesh was placed into the retrorectus space. #1 Novafils were used in interrupted fashion with an endoclose decive as transfascial sutures x 2 bilaterally. One #1 Novafil suture was placed to tack the mesh down to the posterior midline fascia superiorly. This allowed the mesh to be taught. This area was irrigated out. Vistaseal fibrin glue was used to secure the mesh medially over the midline fascia.  Excess hernia sac was excised and subcutaneous tissue flaps were raised bilaterally. #1 PDS was used in a standard running fashion to approximate the anterior midline fascia with undue tension. The subcutaneous layer was then irrigated out with sterile saline and hemostasis was achieved.   At this time the skin was reapproximated using running monocryl suture and sealed with dermabond. The patient tolerated the procedure well was taken to the recovery room with an abdominal binder in stable condition.

## 2020-11-09 NOTE — Discharge Instructions (Signed)
[facility] _______[facility] ° °HERNIA REPAIR: POST OP INSTRUCTIONS ° °Always review your discharge instruction sheet given to you by the facility where your surgery was performed. °IF YOU HAVE DISABILITY OR FAMILY LEAVE FORMS, YOU MUST BRING THEM TO THE OFFICE FOR PROCESSING.   °DO NOT GIVE THEM TO YOUR DOCTOR. ° °1. A  prescription for pain medication may be given to you upon discharge.  Take your pain medication as prescribed, if needed.  If narcotic pain medicine is not needed, then you may take acetaminophen (Tylenol) or ibuprofen (Advil) as needed. °2. Take your usually prescribed medications unless otherwise directed. °If you need a refill on your pain medication, please contact your pharmacy.  They will contact our office to request authorization. Prescriptions will not be filled after 5 pm or on week-ends. °3. You should follow a light diet the first 24 hours after arrival home, such as soup and crackers, etc.  Be sure to include lots of fluids daily.  Resume your normal diet the day after surgery. °4.Most patients will experience some swelling and bruising around the umbilicus or in the groin and scrotum.  Ice packs and reclining will help.  Swelling and bruising can take several days to resolve.  °6. It is common to experience some constipation if taking pain medication after surgery.  Increasing fluid intake and taking a stool softener (such as Colace) will usually help or prevent this problem from occurring.  A mild laxative (Milk of Magnesia or Miralax) should be taken according to package directions if there are no bowel movements after 48 hours. °7. Unless discharge instructions indicate otherwise, you may remove your bandages 24-48 hours after surgery, and you may shower at that time.  You may have steri-strips (small skin tapes) in place directly over the incision.  These strips should be left on the skin for 7-10 days.  If your surgeon used skin glue on the incision, you may shower in  24 hours.  The glue will flake off over the next 2-3 weeks.  Any sutures or staples will be removed at the office during your follow-up visit. °8. ACTIVITIES:  You may resume regular (light) daily activities beginning the next day--such as daily self-care, walking, climbing stairs--gradually increasing activities as tolerated.  You may have sexual intercourse when it is comfortable.  Refrain from any heavy lifting or straining until approved by your doctor. ° °a.You may drive when you are no longer taking prescription pain medication, you can comfortably wear a seatbelt, and you can safely maneuver your car and apply brakes. °b.RETURN TO WORK:   °_____________________________________________ ° °9.You should see your doctor in the office for a follow-up appointment approximately 2-3 weeks after your surgery.  Make sure that you call for this appointment within a day or two after you arrive home to insure a convenient appointment time. °10.OTHER INSTRUCTIONS: _________________________ °   _____________________________________ ° °WHEN TO CALL YOUR DOCTOR: °1. Fever over 101.0 °2. Inability to urinate °3. Nausea and/or vomiting °4. Extreme swelling or bruising °5. Continued bleeding from incision. °6. Increased pain, redness, or drainage from the incision ° °The clinic staff is available to answer your questions during regular business hours.  Please don’t hesitate to call and ask to speak to one of the nurses for clinical concerns.  If you have a medical emergency, go to the nearest emergency room or call 911.  A surgeon from [facility] is always on call at the hospital ° ° °[address], [address], [place], [place]  [address] ? ° [address] [address], [place], [place]   [address] °[phone] ? [phone] ? FAX [phone] °Web site: [email or web address] ° °

## 2020-11-09 NOTE — Anesthesia Procedure Notes (Signed)
Procedure Name: Intubation Date/Time: 11/09/2020 7:37 AM Performed by: Leonor Liv, CRNA Pre-anesthesia Checklist: Patient identified, Emergency Drugs available, Suction available and Patient being monitored Patient Re-evaluated:Patient Re-evaluated prior to induction Oxygen Delivery Method: Circle System Utilized Preoxygenation: Pre-oxygenation with 100% oxygen Induction Type: IV induction Ventilation: Mask ventilation without difficulty and Oral airway inserted - appropriate to patient size Laryngoscope Size: Mac and 4 Grade View: Grade II Tube type: Oral Tube size: 7.5 mm Number of attempts: 1 Airway Equipment and Method: Stylet and Oral airway Placement Confirmation: ETT inserted through vocal cords under direct vision,  positive ETCO2 and breath sounds checked- equal and bilateral Secured at: 21 cm Tube secured with: Tape Dental Injury: Teeth and Oropharynx as per pre-operative assessment

## 2020-11-09 NOTE — Transfer of Care (Signed)
Immediate Anesthesia Transfer of Care Note  Patient: Charles Hart  Procedure(s) Performed: OPEN INCISIONAL HERNIA REPAIR WITH MESH (N/A Abdomen) INSERTION OF MESH (N/A Abdomen)  Patient Location: PACU  Anesthesia Type:General  Level of Consciousness: drowsy and patient cooperative  Airway & Oxygen Therapy: Patient Spontanous Breathing and Patient connected to nasal cannula oxygen  Post-op Assessment: Report given to RN, Post -op Vital signs reviewed and stable and Patient moving all extremities  Post vital signs: Reviewed and stable  Last Vitals:  Vitals Value Taken Time  BP    Temp    Pulse 68 11/09/20 0932  Resp 27 11/09/20 0932  SpO2 98 % 11/09/20 0932  Vitals shown include unvalidated device data.  Last Pain:  Vitals:   11/09/20 0636  TempSrc: Oral         Complications: No complications documented.

## 2020-11-09 NOTE — Anesthesia Postprocedure Evaluation (Signed)
Anesthesia Post Note  Patient: MAXIMOS ZAYAS  Procedure(s) Performed: OPEN INCISIONAL HERNIA REPAIR WITH MESH (N/A Abdomen) INSERTION OF MESH (N/A Abdomen)     Patient location during evaluation: PACU Anesthesia Type: General Level of consciousness: awake and alert Pain management: pain level controlled Vital Signs Assessment: post-procedure vital signs reviewed and stable Respiratory status: spontaneous breathing, nonlabored ventilation, respiratory function stable and patient connected to nasal cannula oxygen Cardiovascular status: blood pressure returned to baseline and stable Postop Assessment: no apparent nausea or vomiting Anesthetic complications: no   No complications documented.  Last Vitals:  Vitals:   11/09/20 1254 11/09/20 1500  BP: (!) 144/85 (!) 145/84  Pulse: 71 70  Resp: 19   Temp: 36.4 C   SpO2: 96% 97%    Last Pain:  Vitals:   11/09/20 1551  TempSrc:   PainSc: 7                  Rosamary Boudreau L Jayne Peckenpaugh

## 2020-11-09 NOTE — Progress Notes (Signed)
Patient arrived to room 6N2 from PACU via bed. Patient is alert and oriented times 4. VSS. NAD. Belongings and call bell within reach. Bed is in the lowest and locked position with bed rails up times 2. Patient oriented to unit and call bell. Patient updated as to plan of care and all questions addressed at this time. Surgical site checked with PACU RN-Erica. Clean, dry, intact midline incision with 4 lap sites with skin glue.

## 2020-11-10 ENCOUNTER — Encounter (HOSPITAL_COMMUNITY): Payer: Self-pay | Admitting: General Surgery

## 2020-11-10 LAB — CBC
HCT: 34.2 % — ABNORMAL LOW (ref 39.0–52.0)
Hemoglobin: 11.1 g/dL — ABNORMAL LOW (ref 13.0–17.0)
MCH: 30.6 pg (ref 26.0–34.0)
MCHC: 32.5 g/dL (ref 30.0–36.0)
MCV: 94.2 fL (ref 80.0–100.0)
Platelets: 229 10*3/uL (ref 150–400)
RBC: 3.63 MIL/uL — ABNORMAL LOW (ref 4.22–5.81)
RDW: 13.3 % (ref 11.5–15.5)
WBC: 11 10*3/uL — ABNORMAL HIGH (ref 4.0–10.5)
nRBC: 0 % (ref 0.0–0.2)

## 2020-11-10 LAB — BASIC METABOLIC PANEL
Anion gap: 17 — ABNORMAL HIGH (ref 5–15)
BUN: 62 mg/dL — ABNORMAL HIGH (ref 8–23)
CO2: 19 mmol/L — ABNORMAL LOW (ref 22–32)
Calcium: 8.3 mg/dL — ABNORMAL LOW (ref 8.9–10.3)
Chloride: 97 mmol/L — ABNORMAL LOW (ref 98–111)
Creatinine, Ser: 11.93 mg/dL — ABNORMAL HIGH (ref 0.61–1.24)
GFR, Estimated: 4 mL/min — ABNORMAL LOW (ref >60)
Glucose, Bld: 120 mg/dL — ABNORMAL HIGH (ref 70–99)
Potassium: 4.7 mmol/L (ref 3.5–5.1)
Sodium: 133 mmol/L — ABNORMAL LOW (ref 135–145)

## 2020-11-10 LAB — GLUCOSE, CAPILLARY: Glucose-Capillary: 132 mg/dL — ABNORMAL HIGH (ref 70–99)

## 2020-11-10 MED ORDER — CALCITRIOL 0.5 MCG PO CAPS
1.0000 ug | ORAL_CAPSULE | ORAL | Status: DC
  Administered 2020-11-14: 1 ug via ORAL
  Filled 2020-11-10 (×3): qty 2

## 2020-11-10 NOTE — Progress Notes (Signed)
1 Day Post-Op   Subjective/Chief Complaint: Pt doing well this AM Some inc soreness   Objective: Vital signs in last 24 hours: Temp:  [97.4 F (36.3 C)-98.6 F (37 C)] 98.6 F (37 C) (03/29 0510) Pulse Rate:  [64-75] 74 (03/29 0510) Resp:  [16-20] 16 (03/29 0510) BP: (128-150)/(76-88) 143/81 (03/29 0510) SpO2:  [95 %-100 %] 100 % (03/29 0510) Last BM Date: 11/08/20  Intake/Output from previous day: 03/28 0701 - 03/29 0700 In: 1950.5 [P.O.:120; I.V.:1530.5; IV Piggyback:300] Out: 151 [Urine:101; Blood:50] Intake/Output this shift: No intake/output data recorded.  General appearance: alert and cooperative GI: soft, non-tender; bowel sounds normal; no masses,  no organomegaly and inc c d/i  Lab Results:  Recent Labs    11/09/20 0631  HGB 12.9*  HCT 38.0*   BMET Recent Labs    11/09/20 0631  NA 137  K 4.1  CL 98  GLUCOSE 91  BUN 49*  CREATININE 11.30*   Anti-infectives: Anti-infectives (From admission, onward)   Start     Dose/Rate Route Frequency Ordered Stop   11/09/20 0600  ceFAZolin (ANCEF) IVPB 2g/100 mL premix        2 g 200 mL/hr over 30 Minutes Intravenous On call to O.R. 11/09/20 0556 11/09/20 0726      Assessment/Plan: s/p Procedure(s): OPEN INCISIONAL HERNIA REPAIR WITH MESH (N/A) INSERTION OF MESH (N/A) Advance diet as tol Will d/w Nephro to set him up with HD Mobilize in hall Home soon  LOS: 0 days    Ralene Ok 11/10/2020

## 2020-11-10 NOTE — Consult Note (Signed)
[facility] Renal Consultation Note  Indication for Consultation:  Management of ESRD/hemodialysis; anemia, hypertension/volume and secondary hyperparathyroidism  HPI: Charles Hart is a 65 y.o. male with ESRDd/t DM type II nephropathy, started HD 01/24/18 (TTS). HTN, non-ischemic cardiomyopathy (EF 30%), Hx CVA, L renal mass appt [facility] 03/07/18 L renal CA Robotic nephrectomy ,05/30/2018 by [facility].  Now admitted scheduled ventral hernia repair with mesh 11/09/2020. he is compliant with dialysis last dialysis on schedule Saturday 3/27.  He reports no recent chills fever shortness of breath chest pain or problems with his AV fistula  Seen in room sitting up in chair, postop pain controlled with meds.  For dialysis today     Past Medical History:  Diagnosis Date  . Adenomatous colon polyp 07/02/2011   Last colonoscopy May 06, 2011 by Dr. Owens Loffler, who recommended repeat colonoscopy in 5 years.   . Anemia   . Background diabetic retinopathy 04/20/2012   Patient is followed by Dr. Katy Fitch   . Cancer ([place])    Kidney  . Cardiomyopathy    LV function improved from 2004 to 2008.  Historically, moderately dilated LV with EF 30-40% by 2D echo 08/14/2002.  Mild CAD with severe LV dysfunction by cardiac cath 09/2002.  Normal coronary arteries and normal LV function by cardiac cath 09/19/2006.  A 2-D echo on 04/01/2009 showed mild concentric hypertrophy and normal systolic (LVEF  62-69%) and doppler C/W with grade 1 diastolic dysfunction.  . CHF (congestive heart failure) ([place])    LV function improved from 2004 to 2008.  Historically, moderately dilated LV with EF 30-40% by 2D echo 08/14/2002.  Mild CAD with severe LV dysfunction by cardiac cath 09/2002.  Normal coronary arteries and normal LV function by cardiac cath 09/19/2006.  A 2-D echo on 04/01/2009 showed mild concentric hypertrophy and normal systolic (LVEF  48-54%) and doppler C/W with grade 1 diastolic  dysfunction..   . Chronic combined systolic and diastolic congestive heart failure ([place]) 05/21/2010   LV function improved from 2004 to 2008.  Historically, moderately dilated LV with EF 30-40% by 2D echo 08/14/2002.  Mild CAD with severe LV dysfunction by cardiac cath 09/2002.  Normal coronary arteries and normal LV function by cardiac cath 09/19/2006.  A 2-D echo on 04/01/2009 showed mild concentric hypertrophy and normal systolic (LVEF  62-70%) and doppler parameters consistent with abnormal left   . CVA (cerebral vascular accident) ([place]) 07/04/2012   MRI of the brain 07/04/2012 showed an acute infarct in the right basal ganglia involving the anterior putamen, anterior limb internal capsule, and head of the caudate; this measured approximately 2.5 cm in diameter.     . Dermatitis   . Diabetes mellitus    type 2  . DIABETIC PERIPHERAL NEUROPATHY 08/03/2007   Qualifier: Diagnosis of  By: Marinda Elk MD, Sonia Side    . DM neuropathy, painful (HCC)    fingers and right knee  . ESRD (end stage renal disease) ([place])    Stage 4, on hemodailysis x 4 months as of 05-18-18 [place] [facility], tues thurs sat  . ESRD (end stage renal disease) on dialysis ([place])    "TTS; [facility]" (05/31/2018)  . Hearing loss in right ear   . Hyperlipidemia   . Hypertension   . Hypertensive crisis 07/28/2012  . Hypertensive urgency 08/20/2014  . Myocardial infarction [facility])  many yrs ago  . Nephrotic syndrome 02/18/2013   A 24-hour urine collection 03/04/2013 showed total protein of 5,460 g and creatinine clearance of 80  mL/minute.  Patient was seen by Seward Meth at [place] and a repeat 24-hour urine showed 10,407 mg protein.  Patient underwent kidney biopsy on 05/30/2013; pathology showed advanced diffuse and nodular diabetic nephropathy with vascular changes consistent with long-standing difficult to control hypertension.     . Pneumonia 2014 and 2015  . Renal insufficiency   . Sleep apnea     11/06/20 - hasn't used it "in a while"    Past Surgical History:  Procedure Laterality Date  . AV FISTULA PLACEMENT Left 11/24/2017   Procedure: INSERTION OF ARTERIOVENOUS (AV) GORE-TEX GRAFT LEFT LOWER ARM;  Surgeon: Rosetta Posner, MD;  Location: [facility];  Service: Vascular;  Laterality: Left;  . CARDIAC CATHETERIZATION      4 times  . COLONOSCOPY    . COLONOSCOPY WITH PROPOFOL N/A 06/18/2020   Procedure: COLONOSCOPY WITH PROPOFOL;  Surgeon: Milus Banister, MD;  Location: [facility];  Service: Endoscopy;  Laterality: N/A;  . FOOT SURGERY    . POLYPECTOMY    . POLYPECTOMY  06/18/2020   Procedure: POLYPECTOMY;  Surgeon: Milus Banister, MD;  Location: [facility];  Service: Endoscopy;;  . ROBOT ASSISTED LAPAROSCOPIC NEPHRECTOMY Left 05/30/2018   Procedure: XI ROBOTIC ASSISTED LAPAROSCOPIC NEPHRECTOMY;  Surgeon: Alexis Frock, MD;  Location: [facility];  Service: Urology;  Laterality: Left;      Family History  Problem Relation Age of Onset  . Aneurysm Father 77       died of rupture  . Colon cancer Sister       reports that he has never smoked. He has never used smokeless tobacco. He reports that he does not drink alcohol and does not use drugs.   Allergies  Allergen Reactions  . Ivp Dye [Iodinated Diagnostic Agents] Other (See Comments)    Per patient's Nephrologist, he doesn't want the patient exposed to ANY dye because of issues with his kidneys  . Hydrocodone     Caused hands and feet to peel   . Phentermine Swelling  . Amlodipine Swelling  . Lisinopril Cough  . Red Dye Other (See Comments)    NO dye of any kind (issues with his kidneys)    Prior to Admission medications   Medication Sig Start Date End Date Taking? Authorizing Provider  calcium acetate (PHOSLO) 667 MG capsule Take 2,001 mg by mouth 3 (three) times daily with meals.  12/15/17  Yes [place], Caren Griffins, MD  carvedilol (COREG) 25 MG tablet Take 1 tablet (25 mg total) by mouth 2 (two) times daily with a meal.  09/27/18  Yes Lucious Groves, DO  Cyanocobalamin (VITAMIN B-12 PO) Take 2,500 mcg by mouth daily.   Yes [provider]  fluticasone (FLONASE) 50 MCG/ACT nasal spray Place 2 sprays into both nostrils as needed for allergies. 09/27/18  Yes Lucious Groves, DO  folic acid-vitamin b complex-vitamin c-selenium-zinc (DIALYVITE) 3 MG TABS tablet Take 1 tablet by mouth daily.   Yes [provider]  furosemide (LASIX) 80 MG tablet Take 1 tablet (80 mg total) by mouth 2 (two) times daily. 01/28/20  Yes Lucious Groves, DO  gabapentin (NEURONTIN) 300 MG capsule Take 300 mg by mouth at bedtime.   Yes [provider]  hydrALAZINE (APRESOLINE) 100 MG tablet TAKE 1 TABLET(100 MG) BY MOUTH THREE TIMES DAILY Patient taking differently: Take 100 mg by mouth 3 (three) times daily. 08/26/20  Yes Lucious Groves, DO  pravastatin (PRAVACHOL) 40 MG tablet Take 1 tablet (  40 mg total) by mouth daily. 01/28/20  Yes Lucious Groves, DO  sildenafil (VIAGRA) 50 MG tablet Take 1 tablet (50 mg total) by mouth as needed for erectile dysfunction. Patient taking differently: Take 50 mg by mouth daily as needed for erectile dysfunction. 11/01/19 10/31/20 Yes Lucious Groves, DO  SUPER B COMPLEX/C PO Take 1 tablet by mouth daily.   Yes [provider]     Results for orders placed or performed during the hospital encounter of 11/09/20 (from the past 48 hour(s))  I-STAT, chem 8     Status: Abnormal   Collection Time: 11/09/20  6:31 AM  Result Value Ref Range   Sodium 137 135 - 145 mmol/L   Potassium 4.1 3.5 - 5.1 mmol/L   Chloride 98 98 - 111 mmol/L   BUN 49 (H) 8 - 23 mg/dL   Creatinine, Ser 11.30 (H) 0.61 - 1.24 mg/dL   Glucose, Bld 91 70 - 99 mg/dL    Comment: Glucose reference range applies only to samples taken after fasting for at least 8 hours.   Calcium, Ion 1.08 (L) 1.15 - 1.40 mmol/L   TCO2 26 22 - 32 mmol/L   Hemoglobin 12.9 (L) 13.0 - 17.0 g/dL   HCT 38.0 (L) 39.0 - 52.0 %   Glucose, capillary     Status: Abnormal   Collection Time: 11/09/20  9:30 AM  Result Value Ref Range   Glucose-Capillary 125 (H) 70 - 99 mg/dL    Comment: Glucose reference range applies only to samples taken after fasting for at least 8 hours.     ROS: See HPI  Physical Exam: Vitals:   11/09/20 1800 11/10/20 0510  BP: (!) 150/87 (!) 143/81  Pulse: 75 74  Resp: 20 16  Temp: 98.2 F (36.8 C) 98.6 F (37 C)  SpO2: 98% 100%     General: Alert male, NAD HEENT: [place], EOMI, PERRLA, nonicteric, Neck: No JVD supple Heart: RRR no MRG Lungs: CTA nonlabored breathing Abdomen: Bowel sounds normal active postop abdominal binder on Extremities: No pedal edema Skin: No overt rash Neuro: Alert oriented x3 no acute focal deficits appreciated Dialysis Access: Positive bruit left forearm AV Gore-Tex graft  Dialysis Orders: : [place] on TTS, 4 hours.  94.5 kg =EDW ,2K, 2.5 CA bath UF profile 4 L FA  AVGG  Heparin 3000,  Calcitriol 1.0 mics q. Dialysis Mircera 30 mics q 2 weeks last given 10/17/20 last hemoglobin 11.5  On 11/03/2020  Assessment/Plan 1. Incisional ventral hernia status post surgical repair with mesh= as per admit/CCS 2. ESRD -HD TTS, K4.1 preop 3/28, can DC furosemide with ESRD patient who makes very little urine status post left nephrectomy not on his home medicines at outpatient kidney center 3. Hypertension/volume  -BP stable, volume is euvolemic, continue Coreg hydralazine 4. Anemia  -Hgb 12.9 on 3/28.  No current ESA at center follow-up trend 5. Metabolic bone disease -continue p.o. calcitriol phosphate binders with meals 6. Nutrition -renal carb modified, renal vitamin  Ernest Haber, PA-C [place] [phone] 11/10/2020, 9:23 AM

## 2020-11-11 DIAGNOSIS — N186 End stage renal disease: Secondary | ICD-10-CM | POA: Diagnosis present

## 2020-11-11 DIAGNOSIS — E8889 Other specified metabolic disorders: Secondary | ICD-10-CM | POA: Diagnosis present

## 2020-11-11 DIAGNOSIS — Z79899 Other long term (current) drug therapy: Secondary | ICD-10-CM | POA: Diagnosis not present

## 2020-11-11 DIAGNOSIS — Z87441 Personal history of nephrotic syndrome: Secondary | ICD-10-CM | POA: Diagnosis not present

## 2020-11-11 DIAGNOSIS — K432 Incisional hernia without obstruction or gangrene: Secondary | ICD-10-CM | POA: Diagnosis present

## 2020-11-11 DIAGNOSIS — E785 Hyperlipidemia, unspecified: Secondary | ICD-10-CM | POA: Diagnosis present

## 2020-11-11 DIAGNOSIS — Z85528 Personal history of other malignant neoplasm of kidney: Secondary | ICD-10-CM | POA: Diagnosis not present

## 2020-11-11 DIAGNOSIS — N2581 Secondary hyperparathyroidism of renal origin: Secondary | ICD-10-CM | POA: Diagnosis present

## 2020-11-11 DIAGNOSIS — Z20822 Contact with and (suspected) exposure to covid-19: Secondary | ICD-10-CM | POA: Diagnosis present

## 2020-11-11 DIAGNOSIS — E11319 Type 2 diabetes mellitus with unspecified diabetic retinopathy without macular edema: Secondary | ICD-10-CM | POA: Diagnosis present

## 2020-11-11 DIAGNOSIS — Z905 Acquired absence of kidney: Secondary | ICD-10-CM | POA: Diagnosis not present

## 2020-11-11 DIAGNOSIS — I252 Old myocardial infarction: Secondary | ICD-10-CM | POA: Diagnosis not present

## 2020-11-11 DIAGNOSIS — Z8673 Personal history of transient ischemic attack (TIA), and cerebral infarction without residual deficits: Secondary | ICD-10-CM | POA: Diagnosis not present

## 2020-11-11 DIAGNOSIS — I5042 Chronic combined systolic (congestive) and diastolic (congestive) heart failure: Secondary | ICD-10-CM | POA: Diagnosis present

## 2020-11-11 DIAGNOSIS — M62838 Other muscle spasm: Secondary | ICD-10-CM | POA: Diagnosis present

## 2020-11-11 DIAGNOSIS — Z992 Dependence on renal dialysis: Secondary | ICD-10-CM | POA: Diagnosis not present

## 2020-11-11 DIAGNOSIS — E1122 Type 2 diabetes mellitus with diabetic chronic kidney disease: Secondary | ICD-10-CM | POA: Diagnosis present

## 2020-11-11 DIAGNOSIS — I132 Hypertensive heart and chronic kidney disease with heart failure and with stage 5 chronic kidney disease, or end stage renal disease: Secondary | ICD-10-CM | POA: Diagnosis present

## 2020-11-11 DIAGNOSIS — Z800 Family history of malignant neoplasm of digestive organs: Secondary | ICD-10-CM | POA: Diagnosis not present

## 2020-11-11 DIAGNOSIS — E1142 Type 2 diabetes mellitus with diabetic polyneuropathy: Secondary | ICD-10-CM | POA: Diagnosis present

## 2020-11-11 DIAGNOSIS — D631 Anemia in chronic kidney disease: Secondary | ICD-10-CM | POA: Diagnosis present

## 2020-11-11 DIAGNOSIS — I2510 Atherosclerotic heart disease of native coronary artery without angina pectoris: Secondary | ICD-10-CM | POA: Diagnosis present

## 2020-11-11 MED ORDER — DIAZEPAM 5 MG PO TABS
5.0000 mg | ORAL_TABLET | Freq: Four times a day (QID) | ORAL | Status: DC | PRN
  Administered 2020-11-11 (×3): 5 mg via ORAL
  Filled 2020-11-11 (×3): qty 1

## 2020-11-11 NOTE — TOC Initial Note (Signed)
Transition of Care [facility]) - Initial/Assessment Note    Patient Details  Name: Charles Hart MRN: [id number] Date of Birth: Jun 02, 1956  Transition of Care [facility]) CM/SW Contact:    Emeterio Reeve, [place] Phone Number: 11/11/2020, 3:14 PM  Clinical Narrative:                  CSW met with pt at bedside. CSW introduced self and explained her role at the hospital.  Pt reports PTA he was living at home alone. Pt reports he does not have a support system. Pt reports he was independent with ADL's and mobility but did use a cane at times. Pt reports he drives himself to HD on TTS. Pt reports he is covid vaccinated, no booster.   CSW reviewed PT/OT reccs of SNF. Pt is fine with SNF and reports he has not been before. PT has no preferences. CSW shared medicare.gov list. Pt stated he would prefer to stay in [place] and gave CSW permission to fax out to facilities.  Pt asked about CIR. CSW consulted with PT who stated that hes diagnosis probably wont make him eligable for CIR plus he does not have support at him for after DC.    Expected Discharge Plan: Skilled Nursing Facility Barriers to Discharge: Continued Medical Work up   Patient Goals and CMS Choice Patient states their goals for this hospitalization and ongoing recovery are:: to get better CMS Medicare.gov Compare Post Acute Care list provided to:: Patient Choice offered to / list presented to : Patient  Expected Discharge Plan and Services Expected Discharge Plan: [place]       Living arrangements for the past 2 months: Apartment                                      Prior Living Arrangements/Services Living arrangements for the past 2 months: Apartment Lives with:: Self Patient language and need for interpreter reviewed:: Yes Do you feel safe going back to the place where you live?: Yes        Care giver support system in place?: No (comment) Current home services: DME Criminal Activity/Legal  Involvement Pertinent to Current Situation/Hospitalization: No - Comment as needed  Activities of Daily Living Home Assistive Devices/Equipment: Cane (specify quad or straight) ADL Screening (condition at time of admission) Patient's cognitive ability adequate to safely complete daily activities?: Yes Is the patient deaf or have difficulty hearing?: Yes Does the patient have difficulty seeing, even when wearing glasses/contacts?: No Does the patient have difficulty concentrating, remembering, or making decisions?: No Patient able to express need for assistance with ADLs?: Yes Does the patient have difficulty dressing or bathing?: No Independently performs ADLs?: Yes (appropriate for developmental age) Does the patient have difficulty walking or climbing stairs?: No Weakness of Legs: None Weakness of Arms/Hands: None  Permission Sought/Granted Permission sought to share information with : Chartered certified accountant granted to share information with : Yes, Verbal Permission Granted     Permission granted to share info w AGENCY: SNF        Emotional Assessment Appearance:: Appears stated age Attitude/Demeanor/Rapport: Engaged Affect (typically observed): Appropriate Orientation: : Oriented to Self,Oriented to Place,Oriented to  Time Alcohol / Substance Use: Not Applicable Psych Involvement: No (comment)  Admission diagnosis:  S/P hernia repair [N62.952, Z87.19] Patient Active Problem List   Diagnosis Date Noted  . S/P hernia repair 11/09/2020  .  Ventral hernia without obstruction or gangrene 05/26/2020  . Surgical pneumoperitoneum   . Trochanteric bursitis of right hip 04/25/2018  . Aortic atherosclerosis ([place]) 12/07/2017  . ESRD on hemodialysis ([place]) 11/24/2017  . S/P arteriovenous (AV) fistula creation 11/24/2017  . Dyspnea 11/08/2017  . History of renal cell carcinoma 09/06/2017  . Piriformis syndrome of right side 03/03/2016  . Muscle spasm of left  shoulder area 03/03/2016  . Secondary hyperparathyroidism, renal ([place]) 02/09/2016  . Primary osteoarthritis of left knee 01/07/2016  . Low back pain 01/07/2016  . Joint stiffness 11/13/2015  . Age-related nuclear cataract of both eyes 10/07/2015  . Normocytic anemia 08/22/2014  . Health care maintenance 05/16/2014  . Nephrotic syndrome in diseases classified elsewhere 02/18/2013  . Obstructive sleep apnea 07/26/2012  . History of stroke 07/04/2012  . Controlled type 2 diabetes mellitus with both eyes affected by proliferative retinopathy without macular edema, with long-term current use of insulin ([place]) 04/20/2012  . ED (erectile dysfunction) 04/18/2012  . Benign neoplasm of colon 07/02/2011  . Chronic combined systolic and diastolic congestive heart failure ([place]) 05/21/2010  . Carotid bruit 04/01/2009  . Type 2 diabetes mellitus with diabetic neuropathy ([place]) 08/03/2007  . Hearing loss 07/30/2007  . Controlled type 2 diabetes mellitus with chronic kidney disease ([place]) 09/15/2006  . Other and unspecified hyperlipidemia 06/01/2006  . Essential (primary) hypertension 06/01/2006   PCP:  Lucious Groves, DO Pharmacy:   [facility], [address] AT [address] [place] [address] Phone: [phone] Fax: [phone]     Social Determinants of Health (SDOH) Interventions    Readmission Risk Interventions No flowsheet data found.  Emeterio Reeve, Latanya Presser, [place] Social Worker [phone]

## 2020-11-11 NOTE — NC FL2 (Signed)
[place] LEVEL OF CARE SCREENING TOOL     IDENTIFICATION  Patient Name: Charles Hart Birthdate: 01-Mar-1956 Sex: male Admission Date (Current Location): 11/09/2020  [facility] and [place] Number:  Herbalist and Address:  [facility] [place], [place] [address] [place], [place] [address]      Provider Number: [id number]  Attending Physician Name and Address:  Ralene Ok, MD  Relative Name and Phone Number:       Current Level of Care: Hospital Recommended Level of Care: [place] Prior Approval Number:    Date Approved/Denied:   PASRR Number: [id number]  Discharge Plan:      Current Diagnoses: Patient Active Problem List   Diagnosis Date Noted  . S/P hernia repair 11/09/2020  . Ventral hernia without obstruction or gangrene 05/26/2020  . Surgical pneumoperitoneum   . Trochanteric bursitis of right hip 04/25/2018  . Aortic atherosclerosis ([place]) 12/07/2017  . ESRD on hemodialysis ([place]) 11/24/2017  . S/P arteriovenous (AV) fistula creation 11/24/2017  . Dyspnea 11/08/2017  . History of renal cell carcinoma 09/06/2017  . Piriformis syndrome of right side 03/03/2016  . Muscle spasm of left shoulder area 03/03/2016  . Secondary hyperparathyroidism, renal ([place]) 02/09/2016  . Primary osteoarthritis of left knee 01/07/2016  . Low back pain 01/07/2016  . Joint stiffness 11/13/2015  . Age-related nuclear cataract of both eyes 10/07/2015  . Normocytic anemia 08/22/2014  . Health care maintenance 05/16/2014  . Nephrotic syndrome in diseases classified elsewhere 02/18/2013  . Obstructive sleep apnea 07/26/2012  . History of stroke 07/04/2012  . Controlled type 2 diabetes mellitus with both eyes affected by proliferative retinopathy without macular edema, with long-term current use of insulin ([place]) 04/20/2012  . ED (erectile dysfunction) 04/18/2012  . Benign neoplasm of colon 07/02/2011  . Chronic combined systolic  and diastolic congestive heart failure ([place]) 05/21/2010  . Carotid bruit 04/01/2009  . Type 2 diabetes mellitus with diabetic neuropathy ([place]) 08/03/2007  . Hearing loss 07/30/2007  . Controlled type 2 diabetes mellitus with chronic kidney disease ([place]) 09/15/2006  . Other and unspecified hyperlipidemia 06/01/2006  . Essential (primary) hypertension 06/01/2006    Orientation RESPIRATION BLADDER Height & Weight     Self,Time,Place  Normal Continent Weight: (P) 215 lb 9.8 oz (97.8 kg) Height:  5\' 9"  (175.3 cm)  BEHAVIORAL SYMPTOMS/MOOD NEUROLOGICAL BOWEL NUTRITION STATUS      Continent Diet  AMBULATORY STATUS COMMUNICATION OF NEEDS Skin   Extensive Assist   Normal                       Personal Care Assistance Level of Assistance  Feeding,Dressing,Bathing Bathing Assistance: Maximum assistance Feeding assistance: Independent Dressing Assistance: Maximum assistance     Functional Limitations Info  Sight,Hearing,Speech Sight Info: Adequate Hearing Info: Impaired (HOH in right ear) Speech Info: Adequate    SPECIAL CARE FACTORS FREQUENCY  PT (By licensed PT),OT (By licensed OT)     PT Frequency: 5x a week OT Frequency: 5x a week            Contractures Contractures Info: Not present    Additional Factors Info  Code Status,Allergies Code Status Info: Full Allergies Info: Ivp Dye (Iodinated Diagnostic Agents)   Hydrocodone   Phentermine   Amlodipine   Lisinopril   Red Dye           Current Medications (11/11/2020):  This is the current hospital active medication list Current Facility-Administered Medications  Medication Dose Route Frequency Provider Last Rate Last Admin  . [START ON 11/12/2020] calcitRIOL (ROCALTROL) capsule 1 mcg  1 mcg Oral Q T,Th,Sa-HD Zeyfang, David, PA-C      . carvedilol (COREG) tablet 25 mg  25 mg Oral BID WC Ralene Ok, MD   25 mg at 11/11/20 0729  . dextrose 5 %-0.9 % sodium chloride infusion   Intravenous Continuous Ralene Ok, MD 50 mL/hr at 11/11/20 9373 Infusion Verify at 11/11/20 0659  . diazepam (VALIUM) tablet 5 mg  5 mg Oral Q6H PRN Ralene Ok, MD   5 mg at 11/11/20 1029  . fluticasone (FLONASE) 50 MCG/ACT nasal spray 2 spray  2 spray Each Nare Daily PRN Ralene Ok, MD      . gabapentin (NEURONTIN) capsule 300 mg  300 mg Oral QHS Ralene Ok, MD   300 mg at 11/10/20 2255  . hydrALAZINE (APRESOLINE) tablet 100 mg  100 mg Oral TID Ralene Ok, MD   100 mg at 11/11/20 1029  . ondansetron (ZOFRAN-ODT) disintegrating tablet 4 mg  4 mg Oral Q6H PRN Ralene Ok, MD       Or  . ondansetron [facility]) injection 4 mg  4 mg Intravenous Q6H PRN Ralene Ok, MD   4 mg at 11/09/20 2206  . oxyCODONE (Oxy IR/ROXICODONE) immediate release tablet 5-10 mg  5-10 mg Oral Q4H PRN Jillyn Ledger, PA-C   5 mg at 11/11/20 4287  . pravastatin (PRAVACHOL) tablet 40 mg  40 mg Oral q1800 Ralene Ok, MD   40 mg at 11/10/20 1717     Discharge Medications: Please see discharge summary for a list of discharge medications.  Relevant Imaging Results:  Relevant Lab Results:   Additional Information SSN: [id number], covid vaccinated no booster  Emeterio Reeve, LCSWA

## 2020-11-11 NOTE — Progress Notes (Signed)
2 Days Post-Op   Subjective/Chief Complaint: Pt with some soreness today Had HD yesterday Some nausea yesterday and better today   Objective: Vital signs in last 24 hours: Temp:  [98.1 F (36.7 C)-99.9 F (37.7 C)] 99.9 F (37.7 C) (03/30 0609) Pulse Rate:  [75-98] 94 (03/30 0609) Resp:  [16-20] 17 (03/30 0609) BP: (118-149)/(60-95) 119/95 (03/30 0609) SpO2:  [95 %-100 %] 95 % (03/30 0609) Weight:  [97.8 kg-98.8 kg] (P) 97.8 kg (03/29 2157) Last BM Date: 11/08/20  Intake/Output from previous day: 03/29 0701 - 03/30 0700 In: 1324.2 [P.O.:120; I.V.:1204.2] Out: 1075 [Urine:75] Intake/Output this shift: No intake/output data recorded.  General appearance: alert and cooperative GI: soft, non-tender; bowel sounds normal; no masses,  no organomegaly and inc cdi  Lab Results:  Recent Labs    11/09/20 0631 11/10/20 1853  WBC  --  11.0*  HGB 12.9* 11.1*  HCT 38.0* 34.2*  PLT  --  229   BMET Recent Labs    11/09/20 0631 11/10/20 0818  NA 137 133*  K 4.1 4.7  CL 98 97*  CO2  --  19*  GLUCOSE 91 120*  BUN 49* 62*  CREATININE 11.30* 11.93*  CALCIUM  --  8.3*   Assessment/Plan: s/p Procedure(s): OPEN INCISIONAL HERNIA REPAIR WITH MESH (N/A) INSERTION OF MESH (N/A) Advance diet  Will consult PT to help with mobilization Add valium for muscle spasms Home in 1-2d  LOS: 0 days    Ralene Ok 11/11/2020

## 2020-11-11 NOTE — Progress Notes (Signed)
   11/11/20 1344  Assess: MEWS Score  Temp 99.1 F (37.3 C)  BP (!) 94/58  Pulse Rate (!) 115  Resp 18  SpO2 94 %  Assess: MEWS Score  MEWS Temp 0  MEWS Systolic 1  MEWS Pulse 2  MEWS RR 0  MEWS LOC 0  MEWS Score 3  MEWS Score Color Yellow  Assess: if the MEWS score is Yellow or Red  Were vital signs taken at a resting state? Yes  Focused Assessment No change from prior assessment  Early Detection of Sepsis Score *See Row Information* Low  MEWS guidelines implemented *See Row Information* Yes  Treat  MEWS Interventions Escalated (See documentation below)  Pain Score 4  Take Vital Signs  Increase Vital Sign Frequency  Yellow: Q 2hr X 2 then Q 4hr X 2, if remains yellow, continue Q 4hrs  Escalate  MEWS: Escalate Yellow: discuss with charge nurse/RN and consider discussing with provider and RRT  Notify: Charge Nurse/RN  Name of Charge Nurse/RN Notified Hulan Amato  Date Charge Nurse/RN Notified 11/11/20  Time Charge Nurse/RN Notified 1345  Notify: Provider  Provider Name/Title Dr. Rosendo Gros  Date Provider Notified 11/11/20  Time Provider Notified 1400  Notification Type Page  Notification Reason Change in status  Provider response See new orders  Date of Provider Response 11/11/20  Time of Provider Response 1400  Document  Patient Outcome Stabilized after interventions  Progress note created (see row info) Yes

## 2020-11-11 NOTE — Progress Notes (Signed)
Subjective: Sitting up in chair reports daily improving postop pain tolerating dialysis yesterday on schedule  Objective Vital signs in last 24 hours: Vitals:   11/10/20 2157 11/10/20 2240 11/11/20 0249 11/11/20 0609  BP: (P) 112/66 (!) 143/68 134/76 (!) 119/95  Pulse: (P) 98 97 94 94  Resp:   16 17  Temp: (P) 99.1 F (37.3 C) 99.6 F (37.6 C) 99.5 F (37.5 C) 99.9 F (37.7 C)  TempSrc: (P) Oral Oral Oral Oral  SpO2: (P) 100% 95% 96% 95%  Weight: (P) 97.8 kg     Height:       Weight change:   Physical Exam: General: Alert male, NAD Heart: RRR no MRG Lungs: CTA nonlabored breathing Abdomen: Bowel sounds normal active postop abdominal binder on Extremities: No pedal edema Dialysis Access: Positive bruit left forearm AV Gore-Tex graft  Dialysis Orders: : [place] on TTS, 4 hours.  94.5 kg =EDW ,2K, 2.5 CA bath UF profile 4 L FA  AVGG  Heparin 3000,  Calcitriol 1.0 mics q. Dialysis Mircera 30 mics q 2 weeks last given 10/17/20 last hemoglobin 11.5  On 11/03/2020  Problem/Plan 1. Incisional ventral hernia status post surgical repair with mesh= as per admit/CCS 2. ESRD -HD TTS, K4.7 = 3/29, continue HD on schedule  3. Hypertension/volume  -BP stable, volume is euvolemic, continue Coreg hydralazine 4. Anemia  -Hgb 12.9 > 11.1.  No current ESA at center follow-up trend 5. Metabolic bone disease -continue p.o. calcitriol phosphate binders with meals 6. Nutrition -renal carb modified, renal vitamin  Ernest Haber, PA-C [facility] Beeper [phone] 11/11/2020,1:13 PM  LOS: 0 days   Labs: Basic Metabolic Panel: Recent Labs  Lab 11/09/20 0631 11/10/20 0818  NA 137 133*  K 4.1 4.7  CL 98 97*  CO2  --  19*  GLUCOSE 91 120*  BUN 49* 62*  CREATININE 11.30* 11.93*  CALCIUM  --  8.3*   Liver Function Tests: No results for input(s): AST, ALT, ALKPHOS, BILITOT, PROT, ALBUMIN in the last 168 hours. No results for input(s): LIPASE, AMYLASE in the last 168  hours. No results for input(s): AMMONIA in the last 168 hours. CBC: Recent Labs  Lab 11/09/20 0631 11/10/20 1853  WBC  --  11.0*  HGB 12.9* 11.1*  HCT 38.0* 34.2*  MCV  --  94.2  PLT  --  229   Cardiac Enzymes: No results for input(s): CKTOTAL, CKMB, CKMBINDEX, TROPONINI in the last 168 hours. CBG: Recent Labs  Lab 11/09/20 0930 11/10/20 2258  GLUCAP 125* 132*    Studies/Results: No results found. Medications: . dextrose 5 % and 0.9% NaCl 50 mL/hr at 11/11/20 0659   . [START ON 11/12/2020] calcitRIOL  1 mcg Oral Q T,Th,Sa-HD  . carvedilol  25 mg Oral BID WC  . gabapentin  300 mg Oral QHS  . hydrALAZINE  100 mg Oral TID  . pravastatin  40 mg Oral q1800

## 2020-11-11 NOTE — Evaluation (Signed)
Physical Therapy Evaluation Patient Details Name: Charles Hart MRN: [id number] DOB: 1956/02/20 Today's Date: 11/11/2020   History of Present Illness  Pt is a 65 y/o male s/p hernia repair on 3/28. PMH includes kidney cancer, ESRD on HD, CVA, nonischemic cardiomopathy, DM, and HTN.  Clinical Impression  Pt admitted secondary to problem above with deficits below. Pt requiring max A to stand with stedy from lower chair height this session. Was able to perform a few march steps using stedy. Pt reporting increased pain in abdomen. Pt currently lives alone and is having increased difficulty ambulating and caring for himself. Recommending SNF level therapies at d/c. Will continue to follow acutely.      Follow Up Recommendations SNF    Equipment Recommendations  Rolling walker with 5" wheels;3in1 (PT)    Recommendations for Other Services       Precautions / Restrictions Precautions Precautions: Fall Precaution Comments: Abdominal binder Restrictions Weight Bearing Restrictions: No      Mobility  Bed Mobility               General bed mobility comments: In chair upon entry.    Transfers Overall transfer level: Needs assistance   Transfers: Sit to/from Stand Sit to Stand: Max assist         General transfer comment: Max A to stand with use of stedy. Pt with posterior lean in sitting secondary to increased pain. Stood [name] with stedy and had pt march in place.  Ambulation/Gait                Stairs            Wheelchair Mobility    Modified Rankin (Stroke Patients Only)       Balance Overall balance assessment: Needs assistance Sitting-balance support: Feet supported;Bilateral upper extremity supported Sitting balance-Leahy Scale: Poor Sitting balance - Comments: without trunk support, pt having to hold to armrests and with posterior lean.   Standing balance support: Bilateral upper extremity supported Standing balance-Leahy Scale:  Poor Standing balance comment: Reliant on BUE support                             Pertinent Vitals/Pain Pain Assessment: 0-10 Pain Score: 8  Pain Location: abdomen Pain Descriptors / Indicators: Aching;Operative site guarding Pain Intervention(s): Limited activity within patient's tolerance;Monitored during session;Repositioned    Home Living Family/patient expects to be discharged to:: Private residence Living Arrangements: Alone   Type of Home: Apartment Home Access: Stairs to enter;Elevator Entrance Stairs-Rails: Right Entrance Stairs-Number of Steps: has elevator he can use; otherwise has to perform 3 flights Home Layout: One level Home Equipment: Cane - single point      Prior Function Level of Independence: Independent with assistive device(s)         Comments: Occasional use of cane     Hand Dominance        Extremity/Trunk Assessment   Upper Extremity Assessment Upper Extremity Assessment: Defer to OT evaluation    Lower Extremity Assessment Lower Extremity Assessment: Generalized weakness    Cervical / Trunk Assessment Cervical / Trunk Assessment: Other exceptions Cervical / Trunk Exceptions: s/p hernia repair  Communication   Communication: No difficulties  Cognition Arousal/Alertness: Awake/alert Behavior During Therapy: WFL for tasks assessed/performed Overall Cognitive Status: Within Functional Limits for tasks assessed  General Comments      Exercises     Assessment/Plan    PT Assessment Patient needs continued PT services  PT Problem List Decreased strength;Decreased activity tolerance;Decreased balance;Decreased mobility;Decreased knowledge of use of DME;Decreased knowledge of precautions;Pain       PT Treatment Interventions DME instruction;Functional mobility training;Stair training;Gait training;Therapeutic activities;Therapeutic exercise;Balance  training;Patient/family education    PT Goals (Current goals can be found in the Care Plan section)  Acute Rehab PT Goals Patient Stated Goal: to be able to move better before going home PT Goal Formulation: With patient Time For Goal Achievement: 11/25/20 Potential to Achieve Goals: Good    Frequency Min 3X/week   Barriers to discharge Decreased caregiver support      Co-evaluation               AM-PAC PT "6 Clicks" Mobility  Outcome Measure Help needed turning from your back to your side while in a flat bed without using bedrails?: A Little Help needed moving from lying on your back to sitting on the side of a flat bed without using bedrails?: A Lot Help needed moving to and from a bed to a chair (including a wheelchair)?: A Lot Help needed standing up from a chair using your arms (e.g., wheelchair or bedside chair)?: A Lot Help needed to walk in hospital room?: A Lot Help needed climbing 3-5 steps with a railing? : Total 6 Click Score: 12    End of Session Equipment Utilized During Treatment: Gait belt Activity Tolerance: Patient limited by pain Patient left: in chair;with call bell/phone within reach;with chair alarm set Nurse Communication: Mobility status PT Visit Diagnosis: Other abnormalities of gait and mobility (R26.89);Muscle weakness (generalized) (M62.81);Difficulty in walking, not elsewhere classified (R26.2);Pain Pain - part of body:  (abdomen)    Time: 9470-9628 PT Time Calculation (min) (ACUTE ONLY): 19 min   Charges:   PT Evaluation $PT Eval Moderate Complexity: 1 Mod        Reuel Derby, PT, DPT  Acute Rehabilitation Services  Pager: [phone] Office: [phone]   Rudean Hitt 11/11/2020, 12:29 PM

## 2020-11-12 ENCOUNTER — Inpatient Hospital Stay (HOSPITAL_COMMUNITY): Payer: 59

## 2020-11-12 LAB — RENAL FUNCTION PANEL
Albumin: 3.6 g/dL (ref 3.5–5.0)
Anion gap: 19 — ABNORMAL HIGH (ref 5–15)
BUN: 62 mg/dL — ABNORMAL HIGH (ref 8–23)
CO2: 28 mmol/L (ref 22–32)
Calcium: 8.6 mg/dL — ABNORMAL LOW (ref 8.9–10.3)
Chloride: 88 mmol/L — ABNORMAL LOW (ref 98–111)
Creatinine, Ser: 12.3 mg/dL — ABNORMAL HIGH (ref 0.61–1.24)
GFR, Estimated: 4 mL/min — ABNORMAL LOW (ref >60)
Glucose, Bld: 120 mg/dL — ABNORMAL HIGH (ref 70–99)
Phosphorus: 9.7 mg/dL — ABNORMAL HIGH (ref 2.5–4.6)
Potassium: 3.9 mmol/L (ref 3.5–5.1)
Sodium: 135 mmol/L (ref 135–145)

## 2020-11-12 LAB — CBC
HCT: 32.8 % — ABNORMAL LOW (ref 39.0–52.0)
Hemoglobin: 10.5 g/dL — ABNORMAL LOW (ref 13.0–17.0)
MCH: 30.2 pg (ref 26.0–34.0)
MCHC: 32 g/dL (ref 30.0–36.0)
MCV: 94.3 fL (ref 80.0–100.0)
Platelets: 214 10*3/uL (ref 150–400)
RBC: 3.48 MIL/uL — ABNORMAL LOW (ref 4.22–5.81)
RDW: 13.2 % (ref 11.5–15.5)
WBC: 9.5 10*3/uL (ref 4.0–10.5)
nRBC: 0 % (ref 0.0–0.2)

## 2020-11-12 LAB — BLOOD GAS, ARTERIAL
Acid-Base Excess: 3 mmol/L — ABNORMAL HIGH (ref 0.0–2.0)
Bicarbonate: 27.7 mmol/L (ref 20.0–28.0)
Drawn by: 56037
FIO2: 28
O2 Saturation: 94.3 %
Patient temperature: 37.9
pCO2 arterial: 49.7 mmHg — ABNORMAL HIGH (ref 32.0–48.0)
pH, Arterial: 7.369 (ref 7.350–7.450)
pO2, Arterial: 77 mmHg — ABNORMAL LOW (ref 83.0–108.0)

## 2020-11-12 MED ORDER — CALCITRIOL 0.5 MCG PO CAPS
ORAL_CAPSULE | ORAL | Status: AC
  Administered 2020-11-12: 1 ug via ORAL
  Filled 2020-11-12: qty 2

## 2020-11-12 MED ORDER — METOCLOPRAMIDE HCL 5 MG/ML IJ SOLN
5.0000 mg | Freq: Four times a day (QID) | INTRAMUSCULAR | Status: AC
  Administered 2020-11-12 (×4): 5 mg via INTRAVENOUS
  Filled 2020-11-12 (×4): qty 2

## 2020-11-12 MED ORDER — ENOXAPARIN SODIUM 30 MG/0.3ML ~~LOC~~ SOLN
30.0000 mg | SUBCUTANEOUS | Status: DC
  Administered 2020-11-12 – 2020-11-18 (×7): 30 mg via SUBCUTANEOUS
  Filled 2020-11-12 (×7): qty 0.3

## 2020-11-12 MED ORDER — BISACODYL 10 MG RE SUPP
10.0000 mg | Freq: Once | RECTAL | Status: AC
  Administered 2020-11-12: 10 mg via RECTAL
  Filled 2020-11-12: qty 1

## 2020-11-12 NOTE — Progress Notes (Signed)
Subjective: Noted  events overnight needing NG tube, currently seen NG tube in place with drainage no complaints, for dialysis today.  Objective Vital signs in last 24 hours: Vitals:   11/11/20 2152 11/12/20 0133 11/12/20 0158 11/12/20 0437  BP: 132/74 (!) 150/87 (!) 159/83 133/70  Pulse: 83 92 98 96  Resp: 17 18 (!) 25 20  Temp: 99.1 F (37.3 C) 99.1 F (37.3 C) 100.2 F (37.9 C) 99.5 F (37.5 C)  TempSrc: Oral Oral Oral Oral  SpO2: 94% 92% 98% 94%  Weight:      Height:       Weight change:   Physical Exam: General:Alert male, NAD Heart:RRR no MRG Lungs:CTA nonlabored breathing Abdomen:Abdomen slightly distended but bowel sounds normal active, has NG tube in place , soft, surgical incision dry no discharge Extremities:No pedal edema Dialysis Access:Positive bruit left forearm AV Gore-Tex graft  [place]: [place].94.5 kg =EDW,2K, 2.5 CA bath UF profile 4L FA AVGG  Heparin 3000,  Calcitriol 1.0 mics q. Dialysis Mircera30 micsq2 weeks last given 10/17/20 last hemoglobin 11.5On3/22/2022  CXR 11/12/20 at 2:30 AM=Pulmonary hypoinflation. Superimposed multifocal pulmonary infiltrates and retrocardiac consolidation suspicious for superimposed atypical infection or inflammation.  Problem/Plan 1. Incisional ventral hernia status post surgical repair with mesh=as per admit/CCS/required NG tube overnight 2. ESRD -HD TTS,K4.7 = 3/29, continue HD on schedule  3. Hypertension/volume -BP stable, volume is euvolemic on exam, attempt 1.5 to 2 L UF on HD ,continue Coreg hydralazine 4. Anemia -Hgb 12.9 > 11.1. No current ESA at center follow-up trend 5. Metabolic bone disease -continue p.o. calcitriol phosphate binders when back to p.o. meals 6. Nutrition -currently NG T n.p.o. plan per [facility]  Ernest Haber, PA-C [facility] [phone] 11/12/2020,11:57 AM  LOS: 1 day   Labs: Basic Metabolic Panel: Recent Labs   Lab 11/09/20 0631 11/10/20 0818  NA 137 133*  K 4.1 4.7  CL 98 97*  CO2  --  19*  GLUCOSE 91 120*  BUN 49* 62*  CREATININE 11.30* 11.93*  CALCIUM  --  8.3*   Liver Function Tests: No results for input(s): AST, ALT, ALKPHOS, BILITOT, PROT, ALBUMIN in the last 168 hours. No results for input(s): LIPASE, AMYLASE in the last 168 hours. No results for input(s): AMMONIA in the last 168 hours. CBC: Recent Labs  Lab 11/09/20 0631 11/10/20 1853  WBC  --  11.0*  HGB 12.9* 11.1*  HCT 38.0* 34.2*  MCV  --  94.2  PLT  --  229   Cardiac Enzymes: No results for input(s): CKTOTAL, CKMB, CKMBINDEX, TROPONINI in the last 168 hours. CBG: Recent Labs  Lab 11/09/20 0930 11/10/20 2258  GLUCAP 125* 132*    Studies/Results: DG Abd 1 View  Result Date: 11/12/2020 CLINICAL DATA:  Nasogastric tube placement EXAM: ABDOMEN - 1 VIEW COMPARISON:  Chest x-ray 11/12/2020 FINDINGS: Enteric tube with tip and side port overlying the gastric lumen. Gaseous distension of the gastric lumen. No radio-opaque calculi or other significant radiographic abnormality are seen. Partially visualized lungs demonstrate low lung volumes with patchy airspace opacities. IMPRESSION: 1. Enteric tube in good position. 2. Gaseous distension of the stomach. 3. Partially visualized lungs demonstrate low lung volumes with patchy airspace opacities. Followup PA and lateral chest X-ray is recommended in 3-4 weeks following therapy to ensure resolution and exclude underlying malignancy. Electronically Signed   By: Iven Finn M.D.   On: 11/12/2020 03:56   DG CHEST PORT 1 VIEW  Result Date: 11/12/2020  CLINICAL DATA:  Dyspnea EXAM: PORTABLE CHEST 1 VIEW COMPARISON:  07/05/2019 FINDINGS: Lung volumes are small and there is bibasilar atelectasis as well as resultant vascular crowding at the hila. Superimposed patchy pulmonary infiltrates are noted within the mid lung zones bilaterally as well as focal consolidation within the  retrocardiac region is suspicious for superimposed atypical infection or inflammation. No pneumothorax or pleural effusion. Cardiac size is within normal limits when accounting for poor pulmonary insufflation. Similarly, superior mediastinal widening likely relates to pulmonary hypoinflation. No acute bone abnormality. IMPRESSION: Pulmonary hypoinflation. Superimposed multifocal pulmonary infiltrates and retrocardiac consolidation suspicious for superimposed atypical infection or inflammation. Superior mediastinal widening, likely artifactual in nature. Electronically Signed   By: Fidela Salisbury MD   On: 11/12/2020 02:38   Medications:  . calcitRIOL  1 mcg Oral Q T,Th,Sa-HD  . carvedilol  25 mg Oral BID WC  . enoxaparin (LOVENOX) injection  30 mg Subcutaneous Q24H  . gabapentin  300 mg Oral QHS  . hydrALAZINE  100 mg Oral TID  . metoCLOPramide (REGLAN) injection  5 mg Intravenous Q6H  . pravastatin  40 mg Oral q1800

## 2020-11-12 NOTE — Progress Notes (Signed)
Patient started having SOB, RR25. 02 sat 89% room air. Patient was placed to [employer]. initiated 02/2L and 02 went up to 98%/2L. Also, patient vomited x1 with distended abdomen with   hypoactive BS. Writer paged Reather Laurence MD , then called back with new orders( Chest X-ray;Blood Gas; Dulcolax Suppository, and if vomits again, placed NGT)  Will continue to close monitor patient

## 2020-11-12 NOTE — Progress Notes (Signed)
Patient noncompliant with NGT. states 'Im gonna pull it out'. Pt was educated but still Patient says the same thing 'Im gonna pull it out'. States its irritating him. Paged Oliver Barre and awaiting for response.  Will continue to monitor patient.

## 2020-11-12 NOTE — Progress Notes (Signed)
Patient resting quietly in bed, no SOB noted. Writer called lab x2 for STAT lab draw. awaiting

## 2020-11-12 NOTE — Progress Notes (Signed)
3 Days Post-Op   Subjective/Chief Complaint: Overnight events noted NGT output noted Pt sleepy this AM Had BM this AM   Objective: Vital signs in last 24 hours: Temp:  [98.6 F (37 C)-100.2 F (37.9 C)] 99.5 F (37.5 C) (03/31 0437) Pulse Rate:  [71-115] 96 (03/31 0437) Resp:  [17-25] 20 (03/31 0437) BP: (94-159)/(58-93) 133/70 (03/31 0437) SpO2:  [91 %-99 %] 94 % (03/31 0437) Last BM Date: 11/08/20  Intake/Output from previous day: 03/30 0701 - 03/31 0700 In: 760 [P.O.:760] Out: 1400 [Emesis/NG output:1400] Intake/Output this shift: No intake/output data recorded.  General appearance: alert and cooperative GI: soft, non-tender; bowel sounds normal; no masses,  no organomegaly and inc cdi  Lab Results:  Recent Labs    11/10/20 1853  WBC 11.0*  HGB 11.1*  HCT 34.2*  PLT 229   BMET Recent Labs    11/10/20 0818  NA 133*  K 4.7  CL 97*  CO2 19*  GLUCOSE 120*  BUN 62*  CREATININE 11.93*  CALCIUM 8.3*   PT/INR No results for input(s): LABPROT, INR in the last 72 hours. ABG Recent Labs    11/12/20 0330  PHART 7.369  HCO3 27.7    Studies/Results: DG Abd 1 View  Result Date: 11/12/2020 CLINICAL DATA:  Nasogastric tube placement EXAM: ABDOMEN - 1 VIEW COMPARISON:  Chest x-ray 11/12/2020 FINDINGS: Enteric tube with tip and side port overlying the gastric lumen. Gaseous distension of the gastric lumen. No radio-opaque calculi or other significant radiographic abnormality are seen. Partially visualized lungs demonstrate low lung volumes with patchy airspace opacities. IMPRESSION: 1. Enteric tube in good position. 2. Gaseous distension of the stomach. 3. Partially visualized lungs demonstrate low lung volumes with patchy airspace opacities. Followup PA and lateral chest X-ray is recommended in 3-4 weeks following therapy to ensure resolution and exclude underlying malignancy. Electronically Signed   By: Iven Finn M.D.   On: 11/12/2020 03:56   DG CHEST PORT  1 VIEW  Result Date: 11/12/2020 CLINICAL DATA:  Dyspnea EXAM: PORTABLE CHEST 1 VIEW COMPARISON:  07/05/2019 FINDINGS: Lung volumes are small and there is bibasilar atelectasis as well as resultant vascular crowding at the hila. Superimposed patchy pulmonary infiltrates are noted within the mid lung zones bilaterally as well as focal consolidation within the retrocardiac region is suspicious for superimposed atypical infection or inflammation. No pneumothorax or pleural effusion. Cardiac size is within normal limits when accounting for poor pulmonary insufflation. Similarly, superior mediastinal widening likely relates to pulmonary hypoinflation. No acute bone abnormality. IMPRESSION: Pulmonary hypoinflation. Superimposed multifocal pulmonary infiltrates and retrocardiac consolidation suspicious for superimposed atypical infection or inflammation. Superior mediastinal widening, likely artifactual in nature. Electronically Signed   By: Fidela Salisbury MD   On: 11/12/2020 02:38    Anti-infectives: Anti-infectives (From admission, onward)   Start     Dose/Rate Route Frequency Ordered Stop   11/09/20 0600  ceFAZolin (ANCEF) IVPB 2g/100 mL premix        2 g 200 mL/hr over 30 Minutes Intravenous On call to O.R. 11/09/20 0556 11/09/20 0726      Assessment/Plan: s/p Procedure(s): OPEN INCISIONAL HERNIA REPAIR WITH MESH (N/A) INSERTION OF MESH (N/A) -clamp NGT x6h and can resume FLD -mobilize with PT -will need SNF -  LOS: 1 day    Ralene Ok 11/12/2020

## 2020-11-12 NOTE — Progress Notes (Signed)
Paged Lovick,MD about ABG results. Initially ordered BIPAP. Called RT and stated pt needs to be transferred to Progressive unit for BIPAP. Patient's current V/S BP 133/70 PR96 RR20 02sat 94% room air. No SOB noted. NGT output total of 1443ml. Paged Lovick MD is she wanted pt to be transferred and stated to wait for Dr. Rosendo Gros for further assessment prior transfer. Will continue to monitor patient.

## 2020-11-12 NOTE — Progress Notes (Signed)
11/12/20 1200  OT Visit Information  Last OT Received On 11/12/20  Assistance Needed +2  History of Present Illness Pt is a 65 y/o male s/p hernia repair on 3/28. PMH includes kidney cancer, ESRD on HD, CVA, nonischemic cardiomopathy, DM, and HTN.  Precautions  Precautions Fall  Precaution Comments Abdominal binder, NG tube  Restrictions  Weight Bearing Restrictions No  Home Living  Family/patient expects to be discharged to: Private residence  Living Arrangements Alone  Available Help at Discharge Family;Available PRN/intermittently  Type of Home Apartment  Home Access Stairs to enter;Elevator  Entrance Stairs-Number of Steps has elevator he can use; otherwise has to perform 3 flights  Entrance Stairs-Rails Right  Home Layout One level  Bathroom Shower/Tub Tub/shower unit  Research officer, trade union - single point  Prior Function  Level of Independence Independent with assistive device(s)  Comments Occasional use of cane  Communication  Communication No difficulties  Pain Assessment  Pain Assessment Faces  Faces Pain Scale 4  Pain Location abdomen  Pain Descriptors / Indicators Grimacing;Operative site guarding  Pain Intervention(s) Monitored during session;Limited activity within patient's tolerance;Repositioned  Cognition  Arousal/Alertness Awake/alert  Behavior During Therapy Flat affect  Overall Cognitive Status No family/caregiver present to determine baseline cognitive functioning  General Comments minimal verbalizations, A&O x4, very flat, lethargic affect. Following commands appropriately though delayed responses at times.  Upper Extremity Assessment  Upper Extremity Assessment Generalized weakness;Overall [facility] for tasks assessed  Lower Extremity Assessment  Lower Extremity Assessment Defer to PT evaluation  Cervical / Trunk Assessment  Cervical / Trunk Assessment Other exceptions  Cervical / Trunk Exceptions s/p hernia repair  ADL  Overall  ADL's  Needs assistance/impaired  Eating/Feeding NPO  Eating/Feeding Details (indicate cue type and reason) NPO. able to reach hand to mouth without difficulty  Grooming Minimal assistance;Sitting  Upper Body Bathing Minimal assistance;Sitting  Lower Body Bathing Maximal assistance;+2 for safety/equipment;Sit to/from stand  Lower Body Bathing Details (indicate cue type and reason) Stedy  Upper Body Dressing  Minimal assistance;Sitting;Set up  Lower Body Dressing Maximal assistance;+2 for safety/equipment;Sit to/from stand  Lower Body Dressing Details (indicate cue type and reason) Publishing rights manager for safety/equipment;BSC;Comfort height toilet (Stedy)  Armed forces technical officer Details (indicate cue type and reason) Stedy in lieu of ambulation  Toileting- Clothing Manipulation and Hygiene Maximal assistance;+2 for safety/equipment  General ADL Comments Pt competed bed mobility, sat EOB a few minutes with BUE support then completed 4x  sit<>stands from EOB utilizing Stedy. With each standing he took steps in place for a minute or two at a time. BUE support for static standing.  Bed Mobility  Overal bed mobility Needs Assistance  Bed Mobility Rolling;Sidelying to Sit;Sit to Supine  Rolling Mod assist  Sidelying to sit Mod assist  Sit to supine Min assist  General bed mobility comments Discussed log roll and sidelying<>EOB strategy for bed mobility. Pt did well with this coming to EOB needing mod A to full power to sidelying and to stedy/facilitate powering trunk up to EOB position. Pt cued for EOB>sidelying to return to bed at end of session but struggled with this and ultimately completed as sit>supine, min A to fully advance BLE up and onto bed. Pt able to bend both knees to facilitate hip bridging for positioning while in bed.  Transfers  Overall transfer level Needs assistance  Transfer via [place] to/from Stand  Sit to Stand Max assist   General  transfer comment Michaelyn Barter for all sit<>stand transfers per pt preference. Pt pulling up on bar to stand, max A to powerup up and stedy, improved to mod A with repitition. Mod A to control descent. to<>from EOB and Stedy seat.  Balance  Overall balance assessment Needs assistance  Sitting-balance support Feet supported;Bilateral upper extremity supported  Sitting balance-Leahy Scale Poor  Sitting balance - Comments pt using BUE to maintain upright posture sitting EOB  Standing balance support Bilateral upper extremity supported  Standing balance-Leahy Scale Poor  Standing balance comment Reliant on BUE support  OT - End of Session  Equipment Utilized During Treatment Other (comment) Charlaine Dalton)  Activity Tolerance Patient tolerated treatment well  Patient left in bed;with call bell/phone within reach;with bed alarm set  OT Assessment  OT Recommendation/Assessment Patient needs continued OT Services  OT Visit Diagnosis Unsteadiness on feet (R26.81);Muscle weakness (generalized) (M62.81);Pain;Other symptoms and signs involving cognitive function  OT Problem List Decreased strength;Decreased activity tolerance;Impaired balance (sitting and/or standing);Decreased cognition;Decreased knowledge of use of DME or AE;Decreased knowledge of precautions;Pain  OT Plan  OT Frequency (ACUTE ONLY) Min 2X/week  OT Treatment/Interventions (ACUTE ONLY) Self-care/ADL training;Therapeutic exercise;Energy conservation;DME and/or AE instruction;Therapeutic activities;Cognitive remediation/compensation;Patient/family education;Balance training  AM-PAC OT "6 Clicks" Daily Activity Outcome Measure (Version 2)  Help from another person eating meals? 1 (NPO)  Help from another person taking care of personal grooming? 3  Help from another person toileting, which includes using toliet, bedpan, or urinal? 2  Help from another person bathing (including washing, rinsing, drying)? 2  Help from another person to  put on and taking off regular upper body clothing? 3  Help from another person to put on and taking off regular lower body clothing? 2  6 Click Score 13  OT Recommendation  Follow Up Recommendations SNF  OT Equipment Other (comment) (defer to next venue)  Individuals Consulted  Consulted and Agree with Results and Recommendations Patient  Acute Rehab OT Goals  Patient Stated Goal to be able to move better before going home  OT Goal Formulation With patient  Time For Goal Achievement 11/26/20  Potential to Achieve Goals Good  OT Time Calculation  OT Start Time (ACUTE ONLY) 1145  OT Stop Time (ACUTE ONLY) 1220  OT Time Calculation (min) 35 min  OT General Charges  $OT Visit 1 Visit  OT Evaluation  $OT Eval Moderate Complexity 1 Mod  OT Treatments  $Self Care/Home Management  8-22 mins   Pt admitted with the above diagnoses and presents with below problem list. Pt will benefit from continued acute OT to address the below listed deficits and maximize independence with basic ADLs prior to d/c to venue above. At baseline, pt lives alone and is independent with ADLs, endorses occasional use of cane. Pt currently needs max A +2 for safety with LB ADLs, min A with UB ADLs, mod A with bed mobility. Pt with flat, lethargic affect and minimal verbalizations. Tolerated session fairly well, eager to work with therapy to regain independence.  Tyrone Schimke, OT Acute Rehabilitation Services Pager: [phone] Office: [phone]

## 2020-11-12 NOTE — Progress Notes (Signed)
Spoke to Annye English, MD about pt wanting to pull NGT out. No new orders were received at this time. Spoke to pt about what MD said and he was receptive, not commented after the conversation. Pt was assisted to the recliner per his request. Chair alarm is on with call bell within reach. Will continue to close monitor.

## 2020-11-12 NOTE — Progress Notes (Signed)
Patient vomited again x1. Placed 16Fr NGT to R Nare. Abd X-ray confirmed placement. bilious output noted. Paged Rupert Stacks. Will continue to close monitor paitent.

## 2020-11-12 NOTE — Progress Notes (Signed)
Patient  back from HD, vomiting. NGT back to suction.

## 2020-11-13 MED ORDER — CHLORHEXIDINE GLUCONATE CLOTH 2 % EX PADS
6.0000 | MEDICATED_PAD | Freq: Every day | CUTANEOUS | Status: DC
  Administered 2020-11-13 – 2020-11-17 (×3): 6 via TOPICAL

## 2020-11-13 MED ORDER — METOCLOPRAMIDE HCL 5 MG/ML IJ SOLN
5.0000 mg | Freq: Four times a day (QID) | INTRAMUSCULAR | Status: AC
  Administered 2020-11-13 – 2020-11-14 (×4): 5 mg via INTRAVENOUS
  Filled 2020-11-13 (×5): qty 2

## 2020-11-13 NOTE — Progress Notes (Signed)
Physical Therapy Treatment Patient Details Name: Charles Hart MRN: [id number] DOB: 09-03-1955 Today's Date: 11/13/2020    History of Present Illness Pt is a 65 y/o male s/p hernia repair on 3/28. PMH includes kidney cancer, ESRD on HD, CVA, nonischemic cardiomopathy, DM, and HTN.    PT Comments    The pt was seen today for progression of OOB mobility and initiation of gait training. The pt is showing great improvements in capacity for sit-stand transfers, completing with minA of 1 and use of RW by end of session (from [place] of 2 with stedy at prior session), and was able to complete two bouts of ambulation in his room with minA and RW at this time. The pt presents with continued deficits in BLE strength, endurance, and dynamic stability requiring assist for upright mobility at this time. He will continue to benefit from skilled PT to progress functional strength and mobility, and continues to require SNF rehab at d/c due to pt continued need for assist with OOB mobility and pt living alone PTA.     Follow Up Recommendations  SNF     Equipment Recommendations  Rolling walker with 5" wheels;3in1 (PT)    Recommendations for Other Services       Precautions / Restrictions Precautions Precautions: Fall Precaution Comments: Abdominal incision, NG tube Restrictions Weight Bearing Restrictions: No    Mobility  Bed Mobility Overal bed mobility: Needs Assistance             General bed mobility comments: pt OOB in recliner at start and end of session    Transfers Overall transfer level: Needs assistance Equipment used: Rolling walker (2 wheeled) Transfers: Sit to/from Stand Sit to Stand: Mod assist;Min assist         General transfer comment: progressed from [place] with VC from recliner to [place] without need for repeated cues for hand placement by end of session. minA to complete power up and to steady in stance  Ambulation/Gait Ambulation/Gait assistance: Min assist Gait  Distance (Feet): 15 Feet (x2) Assistive device: Rolling walker (2 wheeled) Gait Pattern/deviations: Step-through pattern;Decreased stride length;Trunk flexed;Narrow base of support Gait velocity: decreased Gait velocity interpretation: <1.31 ft/sec, indicative of household ambulator General Gait Details: pt with short, slow strides with increased reliance on UE for support. no LOB, but fatigues (or increased pain? pt unable to verbally clarify) after 15 ft within room and requries seated rest. no LOB         Balance Overall balance assessment: Needs assistance Sitting-balance support: Feet supported;No upper extremity supported Sitting balance-Leahy Scale: Poor Sitting balance - Comments: pt using BUE to maintain upright posture for abdominal comfort   Standing balance support: Bilateral upper extremity supported Standing balance-Leahy Scale: Poor Standing balance comment: Reliant on BUE support                            Cognition Arousal/Alertness: Awake/alert Behavior During Therapy: Flat affect Overall Cognitive Status: No family/caregiver present to determine baseline cognitive functioning                                 General Comments: pt with minimal verbalizations, soft voice, very flat, but able to answer questions when prompted (at times repeated prompts to generate response). following commands with increased time      Exercises General Exercises - Lower Extremity Long Arc Quad: AROM;Both;10 reps;Seated Heel Slides:  AROM;Both;10 reps;Seated    General Comments General comments (skin integrity, edema, etc.): VSS on RA. NG disconnected from suction.      Pertinent Vitals/Pain Pain Assessment: Faces Faces Pain Scale: Hurts even more Pain Location: abdomen Pain Descriptors / Indicators: Grimacing;Operative site guarding Pain Intervention(s): Limited activity within patient's tolerance;Monitored during session;Repositioned            PT Goals (current goals can now be found in the care plan section) Acute Rehab PT Goals Patient Stated Goal: to be able to move better before going home PT Goal Formulation: With patient Time For Goal Achievement: 11/25/20 Potential to Achieve Goals: Good Progress towards PT goals: Progressing toward goals    Frequency    Min 3X/week      PT Plan Current plan remains appropriate       AM-PAC PT "6 Clicks" Mobility   Outcome Measure  Help needed turning from your back to your side while in a flat bed without using bedrails?: A Little Help needed moving from lying on your back to sitting on the side of a flat bed without using bedrails?: A Little Help needed moving to and from a bed to a chair (including a wheelchair)?: A Little Help needed standing up from a chair using your arms (e.g., wheelchair or bedside chair)?: A Little Help needed to walk in hospital room?: A Little Help needed climbing 3-5 steps with a railing? : A Lot 6 Click Score: 17    End of Session Equipment Utilized During Treatment: Gait belt Activity Tolerance: Patient tolerated treatment well;Patient limited by fatigue Patient left: in chair;with call bell/phone within reach;with chair alarm set Nurse Communication: Mobility status PT Visit Diagnosis: Other abnormalities of gait and mobility (R26.89);Muscle weakness (generalized) (M62.81);Difficulty in walking, not elsewhere classified (R26.2);Pain Pain - part of body:  (abdomen)     Time: 1550-1610 PT Time Calculation (min) (ACUTE ONLY): 20 min  Charges:  $Gait Training: 8-22 mins                     Karma Ganja, PT, DPT   Acute Rehabilitation Department Pager #: [phone]   Otho Bellows 11/13/2020, 4:39 PM

## 2020-11-13 NOTE — TOC Progression Note (Signed)
Transition of Care [facility]) - Progression Note    Patient Details  Name: Charles Hart MRN: [id number] Date of Birth: 1956/06/16  Transition of Care [facility]) CM/SW [place], [place] Phone Number: 11/13/2020, 2:44 PM  Clinical Narrative:     CSW gave pt only bed offer of [place]. Pt stated he would look it over. Pt still has NGT and is not medically stable for DC. Insurance Josem Kaufmann will need to be started 24 hours before DC and new covid test will be needed.   Expected Discharge Plan: [place] Barriers to Discharge: Continued Medical Work up  Expected Discharge Plan and Services Expected Discharge Plan: [place] arrangements for the past 2 months: Apartment                                       Social Determinants of Health (SDOH) Interventions    Readmission Risk Interventions No flowsheet data found.  Emeterio Reeve, Latanya Presser, [place] Social Worker [phone]

## 2020-11-13 NOTE — Progress Notes (Signed)
[facility] Progress Note   Subjective:   Pt seen in room. Hoping to get NG tube out soon. Reports some cramping today in legs. Denies SOB, abdominal pain, N/V/D. No stomach pain at present.   Objective Vitals:   11/12/20 2031 11/12/20 2151 11/13/20 0521 11/13/20 0803  BP: 109/83 125/78 124/75 137/70  Pulse: (!) 103 (!) 108 88 87  Resp: 18  15   Temp: 99.2 F (37.3 C)  98.9 F (37.2 C)   TempSrc:      SpO2: 92% 100% 91%   Weight:      Height:       Physical Exam General: WDWN male, alert and in NAD Heart: RRR, no murmurs, rubs or gallops Lungs: CTA bilaterally Abdomen: NG tube present, non-distended, +BS Extremities:No edema b/l lower extremities Dialysis Access:  LUE AVG + bruit  Additional Objective Labs: Basic Metabolic Panel: Recent Labs  Lab 11/09/20 0631 11/10/20 0818 11/12/20 1321  NA 137 133* 135  K 4.1 4.7 3.9  CL 98 97* 88*  CO2  --  19* 28  GLUCOSE 91 120* 120*  BUN 49* 62* 62*  CREATININE 11.30* 11.93* 12.30*  CALCIUM  --  8.3* 8.6*  PHOS  --   --  9.7*   Liver Function Tests: Recent Labs  Lab 11/12/20 1321  ALBUMIN 3.6   CBC: Recent Labs  Lab 11/09/20 0631 11/10/20 1853 11/12/20 1321  WBC  --  11.0* 9.5  HGB 12.9* 11.1* 10.5*  HCT 38.0* 34.2* 32.8*  MCV  --  94.2 94.3  PLT  --  229 214   Blood Culture    Component Value Date/Time   SDES SPUTUM 09/19/2015 2220   SDES SPUTUM 09/19/2015 2220   SPECREQUEST NONE 09/19/2015 2220   SPECREQUEST NONE 09/19/2015 2220   CULT  09/19/2015 2220    NORMAL OROPHARYNGEAL FLORA Performed at [place] 09/20/2015 FINAL 09/19/2015 2220   REPTSTATUS 09/22/2015 FINAL 09/19/2015 2220   CBG: Recent Labs  Lab 11/09/20 0930 11/10/20 2258  GLUCAP 125* 132*    Studies/Results: DG Abd 1 View  Result Date: 11/12/2020 CLINICAL DATA:  Nasogastric tube placement EXAM: ABDOMEN - 1 VIEW COMPARISON:  Chest x-ray 11/12/2020 FINDINGS: Enteric tube with tip and  side port overlying the gastric lumen. Gaseous distension of the gastric lumen. No radio-opaque calculi or other significant radiographic abnormality are seen. Partially visualized lungs demonstrate low lung volumes with patchy airspace opacities. IMPRESSION: 1. Enteric tube in good position. 2. Gaseous distension of the stomach. 3. Partially visualized lungs demonstrate low lung volumes with patchy airspace opacities. Followup PA and lateral chest X-ray is recommended in 3-4 weeks following therapy to ensure resolution and exclude underlying malignancy. Electronically Signed   By: Iven Finn M.D.   On: 11/12/2020 03:56   DG CHEST PORT 1 VIEW  Result Date: 11/12/2020 CLINICAL DATA:  Dyspnea EXAM: PORTABLE CHEST 1 VIEW COMPARISON:  07/05/2019 FINDINGS: Lung volumes are small and there is bibasilar atelectasis as well as resultant vascular crowding at the hila. Superimposed patchy pulmonary infiltrates are noted within the mid lung zones bilaterally as well as focal consolidation within the retrocardiac region is suspicious for superimposed atypical infection or inflammation. No pneumothorax or pleural effusion. Cardiac size is within normal limits when accounting for poor pulmonary insufflation. Similarly, superior mediastinal widening likely relates to pulmonary hypoinflation. No acute bone abnormality. IMPRESSION: Pulmonary hypoinflation. Superimposed multifocal pulmonary infiltrates and retrocardiac consolidation suspicious for superimposed atypical infection or inflammation.  Superior mediastinal widening, likely artifactual in nature. Electronically Signed   By: Fidela Salisbury MD   On: 11/12/2020 02:38   Medications:  . calcitRIOL  1 mcg Oral Q T,Th,Sa-HD  . carvedilol  25 mg Oral BID WC  . enoxaparin (LOVENOX) injection  30 mg Subcutaneous Q24H  . gabapentin  300 mg Oral QHS  . hydrALAZINE  100 mg Oral TID  . metoCLOPramide (REGLAN) injection  5 mg Intravenous Q6H  . pravastatin  40 mg Oral  q1800    [place]patient Dialysis [place], 4 hours.94.5 kg =EDW,2K, 2.5 CA bath UF profile 4L FA AVGG  Heparin 3000,  Calcitriol 1.0 mics q. Dialysis Mircera30 micsq2 weeks last given 10/17/20 last hemoglobin 11.5On3/22/2022  Assessment/Plan:  1. Incisional ventral hernia status post surgical repair with mesh:management per surgery/admitting team. Still with NG tube, to be clamped today per notes 2. ESRD: Tolerating dialysis well. K+ controlled. Continue TTS schedule, next HD tomorrow.  3. Hypertension/volume -BP stable, euvolemic on exam. Some cramping on HD and limited PO intake lately, will decrease UF goal with HD tomorrow. Continue Coreg hydralazine 4. Anemia -Hgb 12.9>11.1 > 10.5. Not due for ESA yet, follow trend,  5. Metabolic bone disease: Calcium controlled, phos elevated. Resume calcitriol and phosphorus binders once tolerating PO intake 6. Nutrition -currently NG T n.p.o. plan per [facility]   Anice Paganini, PA-C 11/13/2020, 8:21 AM  [facility] Pager: [phone]

## 2020-11-13 NOTE — Care Management Important Message (Signed)
Important Message  Patient Details  Name: Charles Hart MRN: [id number] Date of Birth: 1955/08/31   Medicare Important Message Given:  Yes     Orbie Pyo 11/13/2020, 3:58 PM

## 2020-11-13 NOTE — Progress Notes (Signed)
4 Days Post-Op   Subjective/Chief Complaint: Pt with some n/v yesterday States he has n/v at home with meds at time. Asking for NGT out Working with PT Had BM  Objective: Vital signs in last 24 hours: Temp:  [98.5 F (36.9 C)-100.2 F (37.9 C)] 98.9 F (37.2 C) (04/01 0521) Pulse Rate:  [81-108] 88 (04/01 0521) Resp:  [15-18] 15 (04/01 0521) BP: (101-150)/(49-88) 124/75 (04/01 0521) SpO2:  [91 %-100 %] 91 % (04/01 0521) Weight:  [95 kg-97.1 kg] 95 kg (03/31 1650) Last BM Date: 11/08/20  Intake/Output from previous day: 03/31 0701 - 04/01 0700 In: 0  Out: 5900 [Emesis/NG output:3900] Intake/Output this shift: No intake/output data recorded.  General appearance: alert and cooperative GI: soft, non-tender; bowel sounds normal; no masses,  no organomegaly and inc c/d/i  Lab Results:  Recent Labs    11/10/20 1853 11/12/20 1321  WBC 11.0* 9.5  HGB 11.1* 10.5*  HCT 34.2* 32.8*  PLT 229 214   BMET Recent Labs    11/10/20 0818 11/12/20 1321  NA 133* 135  K 4.7 3.9  CL 97* 88*  CO2 19* 28  GLUCOSE 120* 120*  BUN 62* 62*  CREATININE 11.93* 12.30*  CALCIUM 8.3* 8.6*   PT/INR No results for input(s): LABPROT, INR in the last 72 hours. ABG Recent Labs    11/12/20 0330  PHART 7.369  HCO3 27.7    Studies/Results: DG Abd 1 View  Result Date: 11/12/2020 CLINICAL DATA:  Nasogastric tube placement EXAM: ABDOMEN - 1 VIEW COMPARISON:  Chest x-ray 11/12/2020 FINDINGS: Enteric tube with tip and side port overlying the gastric lumen. Gaseous distension of the gastric lumen. No radio-opaque calculi or other significant radiographic abnormality are seen. Partially visualized lungs demonstrate low lung volumes with patchy airspace opacities. IMPRESSION: 1. Enteric tube in good position. 2. Gaseous distension of the stomach. 3. Partially visualized lungs demonstrate low lung volumes with patchy airspace opacities. Followup PA and lateral chest X-ray is recommended in 3-4 weeks  following therapy to ensure resolution and exclude underlying malignancy. Electronically Signed   By: Iven Finn M.D.   On: 11/12/2020 03:56   DG CHEST PORT 1 VIEW  Result Date: 11/12/2020 CLINICAL DATA:  Dyspnea EXAM: PORTABLE CHEST 1 VIEW COMPARISON:  07/05/2019 FINDINGS: Lung volumes are small and there is bibasilar atelectasis as well as resultant vascular crowding at the hila. Superimposed patchy pulmonary infiltrates are noted within the mid lung zones bilaterally as well as focal consolidation within the retrocardiac region is suspicious for superimposed atypical infection or inflammation. No pneumothorax or pleural effusion. Cardiac size is within normal limits when accounting for poor pulmonary insufflation. Similarly, superior mediastinal widening likely relates to pulmonary hypoinflation. No acute bone abnormality. IMPRESSION: Pulmonary hypoinflation. Superimposed multifocal pulmonary infiltrates and retrocardiac consolidation suspicious for superimposed atypical infection or inflammation. Superior mediastinal widening, likely artifactual in nature. Electronically Signed   By: Fidela Salisbury MD   On: 11/12/2020 02:38    Anti-infectives: Anti-infectives (From admission, onward)   Start     Dose/Rate Route Frequency Ordered Stop   11/09/20 0600  ceFAZolin (ANCEF) IVPB 2g/100 mL premix        2 g 200 mL/hr over 30 Minutes Intravenous On call to O.R. 11/09/20 0556 11/09/20 0726      Assessment/Plan: s/p Procedure(s): OPEN INCISIONAL HERNIA REPAIR WITH MESH (N/A) INSERTION OF MESH (N/A) Advance diet to fulls Clamp NGT Will dc pain rx to help prevent n/v SNF per PT  LOS: 2 days  Ralene Ok 11/13/2020

## 2020-11-13 NOTE — Progress Notes (Signed)
Patient wanting to have something to drink but currently NPO. Spoke with Gerald Stabs MD earlier about his diet and stated patient will remain NPO until Rosendo Gros, MD will be back in AM to assess.   Will continue to monitor.

## 2020-11-14 LAB — CBC
HCT: 33.8 % — ABNORMAL LOW (ref 39.0–52.0)
Hemoglobin: 10.8 g/dL — ABNORMAL LOW (ref 13.0–17.0)
MCH: 29.9 pg (ref 26.0–34.0)
MCHC: 32 g/dL (ref 30.0–36.0)
MCV: 93.6 fL (ref 80.0–100.0)
Platelets: 262 10*3/uL (ref 150–400)
RBC: 3.61 MIL/uL — ABNORMAL LOW (ref 4.22–5.81)
RDW: 13.1 % (ref 11.5–15.5)
WBC: 9.6 10*3/uL (ref 4.0–10.5)
nRBC: 0 % (ref 0.0–0.2)

## 2020-11-14 LAB — RENAL FUNCTION PANEL
Albumin: 3.4 g/dL — ABNORMAL LOW (ref 3.5–5.0)
Anion gap: 22 — ABNORMAL HIGH (ref 5–15)
BUN: 79 mg/dL — ABNORMAL HIGH (ref 8–23)
CO2: 30 mmol/L (ref 22–32)
Calcium: 8.1 mg/dL — ABNORMAL LOW (ref 8.9–10.3)
Chloride: 84 mmol/L — ABNORMAL LOW (ref 98–111)
Creatinine, Ser: 12.63 mg/dL — ABNORMAL HIGH (ref 0.61–1.24)
GFR, Estimated: 4 mL/min — ABNORMAL LOW (ref >60)
Glucose, Bld: 110 mg/dL — ABNORMAL HIGH (ref 70–99)
Phosphorus: 11.2 mg/dL — ABNORMAL HIGH (ref 2.5–4.6)
Potassium: 3.7 mmol/L (ref 3.5–5.1)
Sodium: 136 mmol/L (ref 135–145)

## 2020-11-14 LAB — GLUCOSE, CAPILLARY: Glucose-Capillary: 107 mg/dL — ABNORMAL HIGH (ref 70–99)

## 2020-11-14 MED ORDER — CALCITRIOL 0.5 MCG PO CAPS
ORAL_CAPSULE | ORAL | Status: AC
  Filled 2020-11-14: qty 2

## 2020-11-14 NOTE — Progress Notes (Signed)
5 Days Post-Op   Subjective/Chief Complaint: Tolerated dialysis this AM Having bowel movements.  Objective: Vital signs in last 24 hours: Temp:  [97.7 F (36.5 C)-99.2 F (37.3 C)] 99 F (37.2 C) (04/02 1518) Pulse Rate:  [77-131] 123 (04/02 1518) Resp:  [16-18] 18 (04/02 1518) BP: (106-141)/(64-95) 116/78 (04/02 1518) SpO2:  [90 %-100 %] 95 % (04/02 1518) Weight:  [89.1 kg-90.6 kg] 89.1 kg (04/02 1225) Last BM Date: 11/12/20  Intake/Output from previous day: 04/01 0701 - 04/02 0700 In: 240 [P.O.:240] Out: 0  Intake/Output this shift: Total I/O In: 0  Out: 1000 [Other:1000]  General appearance: alert and cooperative GI: soft, non-tender; bowel sounds normal; no masses,  no organomegaly and inc c/d/i  Lab Results:  Recent Labs    11/12/20 1321 11/14/20 0857  WBC 9.5 9.6  HGB 10.5* 10.8*  HCT 32.8* 33.8*  PLT 214 262   BMET Recent Labs    11/12/20 1321 11/14/20 0857  NA 135 136  K 3.9 3.7  CL 88* 84*  CO2 28 30  GLUCOSE 120* 110*  BUN 62* 79*  CREATININE 12.30* 12.63*  CALCIUM 8.6* 8.1*   PT/INR No results for input(s): LABPROT, INR in the last 72 hours. ABG Recent Labs    11/12/20 0330  PHART 7.369  HCO3 27.7    Studies/Results: No results found.  Anti-infectives: Anti-infectives (From admission, onward)   Start     Dose/Rate Route Frequency Ordered Stop   11/09/20 0600  ceFAZolin (ANCEF) IVPB 2g/100 mL premix        2 g 200 mL/hr over 30 Minutes Intravenous On call to O.R. 11/09/20 0556 11/09/20 0726      Assessment/Plan: s/p Procedure(s): OPEN INCISIONAL HERNIA REPAIR WITH MESH (N/A) INSERTION OF MESH (N/A) Advance diet to regular Will dc pain rx to help prevent n/v SNF per PT    LOS: 3 days    Nickola Major Smrithi Pigford 11/14/2020

## 2020-11-14 NOTE — Progress Notes (Signed)
Coreg and Hydaralazine administered . Dose missed due to  HD. Will continue to monitor VS.

## 2020-11-14 NOTE — Progress Notes (Signed)
   11/14/20 1518  Assess: MEWS Score  Temp 99 F (37.2 C)  BP 116/78  Pulse Rate (!) 123  Resp 18  SpO2 95 %  O2 Device Room Air  Assess: MEWS Score  MEWS Temp 0  MEWS Systolic 0  MEWS Pulse 2  MEWS RR 0  MEWS LOC 0  MEWS Score 2  MEWS Score Color Yellow  Treat  MEWS Interventions Administered scheduled meds/treatments  Notify: Provider  Provider Name/Title P . Stechschulte  Date Provider Notified 11/14/20  Time Provider Notified [id number]  Notification Type Rounds  Notification Reason Change in status  Provider response No new orders  Date of Provider Response 11/14/20  Time of Provider Response 1430

## 2020-11-14 NOTE — Progress Notes (Signed)
Back from Dialysis. HR 130 and wants NGT out. Passing gas , BS present, denied nausea and vomitting since yesterday. NGT discontinued .

## 2020-11-14 NOTE — Progress Notes (Signed)
  [facility] Progress Note   Subjective:   Pt seen on HD unit, about to start dialysis. Still has NG tube. Denies SOB, CP, palpitations, dizziness, and abdominal pain.    Objective Vitals:   11/13/20 1324 11/13/20 2212 11/13/20 2212 11/14/20 0507  BP: 123/67 107/64 107/64 113/64  Pulse: 81  80 79  Resp: 16  16 17   Temp: 98.5 F (36.9 C)  99 F (37.2 C) 98.2 F (36.8 C)  TempSrc: Oral  Oral Oral  SpO2: 90%  94% 100%  Weight:      Height:       Physical Exam General: WDWN male, alert and in NAD Heart: RRR, no murmurs, rubs or gallops Lungs: CTA bilaterally Abdomen: NG tube present, non-distended, +BS Extremities:No edema b/l lower extremities Dialysis Access:  LUE AVG + bruit  Additional Objective Labs: Basic Metabolic Panel: Recent Labs  Lab 11/09/20 0631 11/10/20 0818 11/12/20 1321  NA 137 133* 135  K 4.1 4.7 3.9  CL 98 97* 88*  CO2  --  19* 28  GLUCOSE 91 120* 120*  BUN 49* 62* 62*  CREATININE 11.30* 11.93* 12.30*  CALCIUM  --  8.3* 8.6*  PHOS  --   --  9.7*   Liver Function Tests: Recent Labs  Lab 11/12/20 1321  ALBUMIN 3.6   CBC: Recent Labs  Lab 11/09/20 0631 11/10/20 1853 11/12/20 1321  WBC  --  11.0* 9.5  HGB 12.9* 11.1* 10.5*  HCT 38.0* 34.2* 32.8*  MCV  --  94.2 94.3  PLT  --  229 214   Blood Culture    Component Value Date/Time   SDES SPUTUM 09/19/2015 2220   SDES SPUTUM 09/19/2015 2220   SPECREQUEST NONE 09/19/2015 2220   SPECREQUEST NONE 09/19/2015 2220   CULT  09/19/2015 2220    NORMAL OROPHARYNGEAL FLORA Performed at [facility] [place] 09/20/2015 FINAL 09/19/2015 2220   REPTSTATUS 09/22/2015 FINAL 09/19/2015 2220   CBG: Recent Labs  Lab 11/09/20 0930 11/10/20 2258 11/14/20 0652  GLUCAP 125* 132* 107*   Medications:  . calcitRIOL  1 mcg Oral Q T,Th,Sa-HD  . carvedilol  25 mg Oral BID WC  . Chlorhexidine Gluconate Cloth  6 each Topical Q0600  . enoxaparin (LOVENOX) injection  30 mg  Subcutaneous Q24H  . gabapentin  300 mg Oral QHS  . hydrALAZINE  100 mg Oral TID  . pravastatin  40 mg Oral q1800    Outpatient Dialysis Orders: [place], 4 hours.94.5 kg =EDW,2K, 2.5 CA bath UF profile 4L FA AVGG  Heparin 3000,  Calcitriol 1.0 mics q. Dialysis Mircera30 micsq2 weeks last given 10/17/20 last hemoglobin 11.5On3/22/2022  Assessment/Plan: 1. Incisional ventral hernia status post surgical repair with mesh:management per surgery/admitting team. Still with NG tube 2. ESRD: Tolerating dialysis well. K+ controlled. Continue TTS schedule, next HD Tuesday.  3. Hypertension/volume -BP stable, euvolemicon exam. Some cramping on HD and limited PO intake lately, decreased UF goal with HD today. Continue Coreg and hydralazine 4. Anemia -Hgb 12.9>11.1 > 10.5. Not due for ESA yet, follow trend,  5. Metabolic bone disease: Calcium controlled, phos elevated. Resume calcitriol and phosphorus binders once tolerating PO intake 6. Nutrition -currently NGTn.p.o. plan per CCS   Anice Paganini, PA-C 11/14/2020, 8:10 AM  [facility] Pager: [phone]

## 2020-11-15 LAB — GLUCOSE, CAPILLARY
Glucose-Capillary: 116 mg/dL — ABNORMAL HIGH (ref 70–99)
Glucose-Capillary: 103 mg/dL — ABNORMAL HIGH (ref 70–99)
Glucose-Capillary: 135 mg/dL — ABNORMAL HIGH (ref 70–99)

## 2020-11-15 MED ORDER — CALCIUM ACETATE (PHOS BINDER) 667 MG PO CAPS
1334.0000 mg | ORAL_CAPSULE | Freq: Three times a day (TID) | ORAL | Status: DC
Start: 2020-11-15 — End: 2020-11-19
  Administered 2020-11-15 – 2020-11-18 (×10): 1334 mg via ORAL
  Filled 2020-11-15 (×10): qty 2

## 2020-11-15 MED ORDER — ACETAMINOPHEN 325 MG PO TABS: 650.0000 mg | ORAL_TABLET | Freq: Four times a day (QID) | ORAL | Status: DC | PRN

## 2020-11-15 NOTE — Progress Notes (Signed)
  [facility] Progress Note   Subjective:   Pt seen in room, NG tube removed yesterday and he was able to eat. Reports he is feeling much better. Denies SOB, CP, palpitations, dizziness, abdominal pain and nausea. Tolerated HD yesterday with net UF 1L, no cramping.   Objective Vitals:   11/14/20 1720 11/14/20 2155 11/15/20 0115 11/15/20 0657  BP: 122/69 (!) 95/52 96/61 126/73  Pulse: 88 83 86 79  Resp: 18 17 17 17   Temp: 99.6 F (37.6 C) 99.2 F (37.3 C) 99.5 F (37.5 C) 99.5 F (37.5 C)  TempSrc: Oral  Oral Oral  SpO2: 96% 94% 96% 94%  Weight:      Height:       Physical Exam General:WDWN male, alert and in NAD Heart:RRR, no murmurs, rubs or gallops Lungs:CTA bilaterally Abdomen:soft, non-distended, +BS Extremities:No edema b/l lower extremities Dialysis Access:LUE AVG + bruit  Additional Objective Labs: Basic Metabolic Panel: Recent Labs  Lab 11/10/20 0818 11/12/20 1321 11/14/20 0857  NA 133* 135 136  K 4.7 3.9 3.7  CL 97* 88* 84*  CO2 19* 28 30  GLUCOSE 120* 120* 110*  BUN 62* 62* 79*  CREATININE 11.93* 12.30* 12.63*  CALCIUM 8.3* 8.6* 8.1*  PHOS  --  9.7* 11.2*   Liver Function Tests: Recent Labs  Lab 11/12/20 1321 11/14/20 0857  ALBUMIN 3.6 3.4*   CBC: Recent Labs  Lab 11/10/20 1853 11/12/20 1321 11/14/20 0857  WBC 11.0* 9.5 9.6  HGB 11.1* 10.5* 10.8*  HCT 34.2* 32.8* 33.8*  MCV 94.2 94.3 93.6  PLT 229 214 262   Blood Culture    Component Value Date/Time   SDES SPUTUM 09/19/2015 2220   SDES SPUTUM 09/19/2015 2220   SPECREQUEST NONE 09/19/2015 2220   SPECREQUEST NONE 09/19/2015 2220   CULT  09/19/2015 2220    NORMAL OROPHARYNGEAL FLORA Performed at [place] 09/20/2015 FINAL 09/19/2015 2220   REPTSTATUS 09/22/2015 FINAL 09/19/2015 2220   CBG: Recent Labs  Lab 11/09/20 0930 11/10/20 2258 11/14/20 0652 11/15/20 0808  GLUCAP 125* 132* 107* 116*   Medications:  . calcitRIOL  1 mcg  Oral Q T,Th,Sa-HD  . carvedilol  25 mg Oral BID WC  . Chlorhexidine Gluconate Cloth  6 each Topical Q0600  . enoxaparin (LOVENOX) injection  30 mg Subcutaneous Q24H  . gabapentin  300 mg Oral QHS  . hydrALAZINE  100 mg Oral TID  . pravastatin  40 mg Oral q1800     Outpatient Dialysis Orders: [place] [place], 4 hours.94.5 kg =EDW,2K, 2.5 CA bath UF profile 4L FA AVGG  Heparin 3000,  Calcitriol 1.0 mics q. Dialysis Mircera30 micsq2 weeks last given 10/17/20 last hemoglobin 11.5On3/22/2022  Assessment/Plan:  1. Incisional ventral hernia status post surgical repair with mesh:management per surgery/admitting team. Has tolerated some PO intake 2. ESRD: Tolerating dialysis well. K+ controlled. Continue TTS schedule, next HD Tuesday.  3. Hypertension/volume -BP stable, euvolemicon exam. Continue Coreg and hydralazine. Continue volume management with HD 4. Anemia -Hgb 10.8. Last ESA dose was 10/17/20. Hemoglobin remains at goal, follow trend.  5. Metabolic bone disease: Calcium controlled, phos elevated. Continue calcitriol and will resume phosphorus binders now that he is tolerating PO.  6. Nutrition - now on regular diet but phos is quite high, will change to renal diet.      Anice Paganini, PA-C 11/15/2020, 8:39 AM  [facility] Pager: [phone]

## 2020-11-15 NOTE — Progress Notes (Signed)
6 Days Post-Op   Subjective/Chief Complaint: had dialysis yesterday Having bowel movements. No n/v.  sore Objective: Vital signs in last 24 hours: Temp:  [98.3 F (36.8 C)-99.6 F (37.6 C)] 99.5 F (37.5 C) (04/03 0657) Pulse Rate:  [78-131] 79 (04/03 0657) Resp:  [17-18] 17 (04/03 0657) BP: (95-141)/(52-95) 126/73 (04/03 0657) SpO2:  [92 %-96 %] 94 % (04/03 0657) Weight:  [89.1 kg] 89.1 kg (04/02 1225) Last BM Date: 11/14/20  Intake/Output from previous day: 04/02 0701 - 04/03 0700 In: 120 [P.O.:120] Out: 1000  Intake/Output this shift: No intake/output data recorded.  General appearance: alert and cooperative GI: soft, non-tender; bowel sounds normal; no masses,  no organomegaly and inc c/d/i  Lab Results:  Recent Labs    11/12/20 1321 11/14/20 0857  WBC 9.5 9.6  HGB 10.5* 10.8*  HCT 32.8* 33.8*  PLT 214 262   BMET Recent Labs    11/12/20 1321 11/14/20 0857  NA 135 136  K 3.9 3.7  CL 88* 84*  CO2 28 30  GLUCOSE 120* 110*  BUN 62* 79*  CREATININE 12.30* 12.63*  CALCIUM 8.6* 8.1*   PT/INR No results for input(s): LABPROT, INR in the last 72 hours. ABG No results for input(s): PHART, HCO3 in the last 72 hours.  Invalid input(s): PCO2, PO2  Studies/Results: No results found.  Anti-infectives: Anti-infectives (From admission, onward)   Start     Dose/Rate Route Frequency Ordered Stop   11/09/20 0600  ceFAZolin (ANCEF) IVPB 2g/100 mL premix        2 g 200 mL/hr over 30 Minutes Intravenous On call to O.R. 11/09/20 0556 11/09/20 0726      Assessment/Plan: s/p Procedure(s): OPEN INCISIONAL HERNIA REPAIR WITH MESH (N/A) INSERTION OF MESH (N/A) Vitals ok Having bowel function Dietary change per renal SNF per PT Medically stable for dc to snf Appears will need new covid test so will order that & will re-engage TOC  Leighton Ruff. Redmond Pulling, MD, FACS General, Bariatric, & Minimally Invasive Surgery [facility], [name]     LOS: 4 days     Greer Pickerel 11/15/2020

## 2020-11-16 LAB — SARS CORONAVIRUS 2 (TAT 6-24 HRS): SARS Coronavirus 2: NEGATIVE

## 2020-11-16 LAB — GLUCOSE, CAPILLARY
Glucose-Capillary: 104 mg/dL — ABNORMAL HIGH (ref 70–99)
Glucose-Capillary: 141 mg/dL — ABNORMAL HIGH (ref 70–99)
Glucose-Capillary: 178 mg/dL — ABNORMAL HIGH (ref 70–99)
Glucose-Capillary: 158 mg/dL — ABNORMAL HIGH (ref 70–99)

## 2020-11-16 MED ORDER — PROSOURCE PLUS PO LIQD
30.0000 mL | Freq: Two times a day (BID) | ORAL | Status: DC
  Administered 2020-11-16 – 2020-11-18 (×5): 30 mL via ORAL
  Filled 2020-11-16 (×4): qty 30

## 2020-11-16 MED ORDER — HYDROCERIN EX CREA
1.0000 "application " | TOPICAL_CREAM | CUTANEOUS | Status: DC | PRN
  Filled 2020-11-16: qty 113

## 2020-11-16 MED ORDER — HYDRALAZINE HCL 50 MG PO TABS
50.0000 mg | ORAL_TABLET | Freq: Two times a day (BID) | ORAL | Status: DC
  Filled 2020-11-16: qty 1

## 2020-11-16 NOTE — TOC Progression Note (Addendum)
Transition of Care [facility]) - Progression Note    Patient Details  Name: BORUCH MANUELE MRN: [id number] Date of Birth: August 22, 1955  Transition of Care [facility]) CM/SW [place], [place] Phone Number: 11/16/2020, 11:51 AM  Clinical Narrative:    CSW met with pt in room. Pt stated that he feels much better and believes he is able to return home. Pt reports he lives alone. Pt reports he has been independent in his room. Pt is open to HHPT. CSW informed NCM, PT and MD. PT is to re-see pt today.   Pt decided to go to SNF at discharge. [facility] is able to accept pt at [place] started insurance auth, reference number [id number]. Clinicals have been faxed. Covid test was completed on 11/15/20, pt does not need another at this time.   Expected Discharge Plan: [place] Barriers to Discharge: Continued Medical Work up  Expected Discharge Plan and Services Expected Discharge Plan: [place] arrangements for the past 2 months: Apartment                                       Social Determinants of Health (SDOH) Interventions    Readmission Risk Interventions No flowsheet data found.  Emeterio Reeve, Latanya Presser, [place] Social Worker [phone]

## 2020-11-16 NOTE — Progress Notes (Signed)
  [facility] Progress Note   Subjective:  Seen in room - sitting on couch and eating breakfast. No CP/dyspnea or other concerns today. SNF/rehab placement pending per notes.  Objective Vitals:   11/15/20 1432 11/15/20 1500 11/15/20 2159 11/16/20 0431  BP: 100/64 122/77 (!) 99/59 (!) 107/57  Pulse: 79  80 73  Resp: 18  15 15   Temp: 98.7 F (37.1 C)  97.6 F (36.4 C) 98.2 F (36.8 C)  TempSrc: Oral  Oral Oral  SpO2: 95%  96% 98%  Weight:      Height:       Physical Exam General: Well appearing man, NAD. Room air. Heart: RRR; no murmur Lungs: CTAB, no rales Abdomen: soft, non-tender Extremities: No LE edema Dialysis Access: L forearm AVG + bruit  Additional Objective Labs: Basic Metabolic Panel: Recent Labs  Lab 11/10/20 0818 11/12/20 1321 11/14/20 0857  NA 133* 135 136  K 4.7 3.9 3.7  CL 97* 88* 84*  CO2 19* 28 30  GLUCOSE 120* 120* 110*  BUN 62* 62* 79*  CREATININE 11.93* 12.30* 12.63*  CALCIUM 8.3* 8.6* 8.1*  PHOS  --  9.7* 11.2*   Liver Function Tests: Recent Labs  Lab 11/12/20 1321 11/14/20 0857  ALBUMIN 3.6 3.4*   CBC: Recent Labs  Lab 11/10/20 1853 11/12/20 1321 11/14/20 0857  WBC 11.0* 9.5 9.6  HGB 11.1* 10.5* 10.8*  HCT 34.2* 32.8* 33.8*  MCV 94.2 94.3 93.6  PLT 229 214 262   Medications:  . calcitRIOL  1 mcg Oral Q T,Th,Sa-HD  . calcium acetate  1,334 mg Oral TID WC  . carvedilol  25 mg Oral BID WC  . Chlorhexidine Gluconate Cloth  6 each Topical Q0600  . enoxaparin (LOVENOX) injection  30 mg Subcutaneous Q24H  . gabapentin  300 mg Oral QHS  . hydrALAZINE  100 mg Oral TID  . pravastatin  40 mg Oral q1800   Dialysis Orders: TTS at [facility] 4hr, EDW 94.5kg, 2K/2.5Ca, UFP #4, L AVG, heparin 3000 - Calcitriol 26mcg PO q HD - Mircera 44mcg IV q 2 weeks - last 10/17/20.  Assessment/Plan: 1. Incisional Ventral Hernia s/p surgical repair with mesh on 3/28: Per surgery. 2. ESRD: Continue HD per usual TTS schedule - next  4/5. 3. HTN/volume: BP low side - no edema. Below prior EDW. Will reduce hydralazine from 100mg  TID -> 50mg  BID and follow. 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 5. Secondary hyperparathyroidism:  Ca ok, Phos sky high - looks like binders just restarted yesterday afternoon - follow for now. 6. Nutrition: Alb low, will add protein supplement.  Veneta Penton, PA-C 11/16/2020, 11:25 AM  [employer]

## 2020-11-16 NOTE — Progress Notes (Signed)
7 Days Post-Op   Subjective/Chief Complaint: Only complaint is not making much urine (on HD, but usually makes some). No n/v.    Objective: Vital signs in last 24 hours: Temp:  [97.6 F (36.4 C)-98.7 F (37.1 C)] 98.2 F (36.8 C) (04/04 0431) Pulse Rate:  [73-80] 73 (04/04 0431) Resp:  [15-18] 15 (04/04 0431) BP: (99-122)/(57-77) 107/57 (04/04 0431) SpO2:  [95 %-98 %] 98 % (04/04 0431) Last BM Date: 11/14/20  Intake/Output from previous day: 04/03 0701 - 04/04 0700 In: 1210 [P.O.:1210] Out: -  Intake/Output this shift: No intake/output data recorded.  General appearance: alert and cooperative GI: soft, mildly distended, incision c/d/i without erythema or drainage.  approp minimally tender at incision.  No evidence of seroma.   Ext- warm and well perfused.  Some spooning of nails.    Lab Results:  Recent Labs    11/14/20 0857  WBC 9.6  HGB 10.8*  HCT 33.8*  PLT 262   BMET Recent Labs    11/14/20 0857  NA 136  K 3.7  CL 84*  CO2 30  GLUCOSE 110*  BUN 79*  CREATININE 12.63*  CALCIUM 8.1*   PT/INR No results for input(s): LABPROT, INR in the last 72 hours. ABG No results for input(s): PHART, HCO3 in the last 72 hours.  Invalid input(s): PCO2, PO2  Studies/Results: No results found.  Anti-infectives: Anti-infectives (From admission, onward)   Start     Dose/Rate Route Frequency Ordered Stop   11/09/20 0600  ceFAZolin (ANCEF) IVPB 2g/100 mL premix        2 g 200 mL/hr over 30 Minutes Intravenous On call to O.R. 11/09/20 0556 11/09/20 0726      Assessment/Plan: s/p Procedure(s): OPEN INCISIONAL HERNIA REPAIR WITH MESH (N/A) INSERTION OF MESH (N/A) Bowel function OK.  SNF per PT, pt lives alone Medically stable for dc to snf HD T/H/S  Repeat COVID test 11/15/20 negative.     Milus Height, MD FACS Surgical Oncology, General Surgery, Trauma and [facility], [place] for weekday/non holidays Check [email or web address]  for coverage night/weekend/holidays  Do not use SecureChat as it is not reliable for timely patient care.       LOS: 5 days    Stark Klein 11/16/2020

## 2020-11-16 NOTE — Progress Notes (Signed)
Physical Therapy Treatment Patient Details Name: Charles Hart MRN: [id number] DOB: 1956-06-16 Today's Date: 11/16/2020    History of Present Illness Pt is a 65 y/o male s/p hernia repair on 3/28. PMH includes kidney cancer, ESRD on HD, CVA, nonischemic cardiomopathy, DM, and HTN.    PT Comments    Pt supine in bed on arrival.  He required increased time to rouse.  Pt participated in gt training but continues to require min guard to min A.  Will continue to recommend snf as he lives home alone.  Pt would be a great candidate to return home if he had family support but he unfortunately lacks this at this time.     Follow Up Recommendations  SNF     Equipment Recommendations  Rolling walker with 5" wheels;3in1 (PT)    Recommendations for Other Services       Precautions / Restrictions Precautions Precautions: Fall Precaution Comments: Abdominal incision, NG tube Restrictions Weight Bearing Restrictions: No    Mobility  Bed Mobility Overal bed mobility: Needs Assistance Bed Mobility: Supine to Sit     Supine to sit: Supervision     General bed mobility comments: Pt able to move to edge of bed unassisted.  Abdominal binder in place but no assistance to move to edge of bed.    Transfers Overall transfer level: Needs assistance Equipment used: Straight cane Transfers: Sit to/from Stand Sit to Stand: Min guard         General transfer comment: Cues for hand placement to and from seated surface.  Ambulation/Gait Ambulation/Gait assistance: Min guard;Min assist (LOB x1 requiring min assistance to correct.) Gait Distance (Feet): 150 Feet (x2) Assistive device: Straight cane;None Gait Pattern/deviations: Step-through pattern;Decreased stride length;Trunk flexed;Narrow base of support Gait velocity: decreased   General Gait Details: Pt performed 1st trial without device.  Pt performed additional trial with cane.  LOB noted without cane.  Pt required cues for posture  and increasing BOS to improve balance.   Stairs Stairs: Yes Stairs assistance: Min assist Stair Management: One rail Right Number of Stairs: 12 General stair comments: Cues for sequencing and safety this session.  Pt unsteady this session with heavy reliance on rail.   Wheelchair Mobility    Modified Rankin (Stroke Patients Only)       Balance Overall balance assessment: Needs assistance Sitting-balance support: Feet supported;No upper extremity supported Sitting balance-Leahy Scale: Fair       Standing balance-Leahy Scale: Poor                              Cognition Arousal/Alertness: Lethargic;Suspect due to medications Behavior During Therapy: Flat affect Overall Cognitive Status: No family/caregiver present to determine baseline cognitive functioning                                 General Comments: Pt very sleepy on arrival and noted with staggerring and poor safety awareness.      Exercises      General Comments        Pertinent Vitals/Pain Pain Assessment: No/denies pain    Home Living                      Prior Function            PT Goals (current goals can now be found in the care plan section) Acute  Rehab PT Goals Patient Stated Goal: to be able to move better before going home Potential to Achieve Goals: Good Progress towards PT goals: Progressing toward goals    Frequency    Min 3X/week      PT Plan Current plan remains appropriate    Co-evaluation              AM-PAC PT "6 Clicks" Mobility   Outcome Measure  Help needed turning from your back to your side while in a flat bed without using bedrails?: A Little Help needed moving from lying on your back to sitting on the side of a flat bed without using bedrails?: A Little Help needed moving to and from a bed to a chair (including a wheelchair)?: A Little Help needed standing up from a chair using your arms (e.g., wheelchair or bedside  chair)?: A Little Help needed to walk in hospital room?: A Little Help needed climbing 3-5 steps with a railing? : A Little 6 Click Score: 18    End of Session Equipment Utilized During Treatment: Gait belt Activity Tolerance: Patient tolerated treatment well;Patient limited by fatigue Patient left: in chair;with call bell/phone within reach;with chair alarm set Nurse Communication: Mobility status PT Visit Diagnosis: Other abnormalities of gait and mobility (R26.89);Muscle weakness (generalized) (M62.81);Difficulty in walking, not elsewhere classified (R26.2);Pain Pain - part of body:  (abdomen)     Time: 1202-1230 PT Time Calculation (min) (ACUTE ONLY): 28 min  Charges:  $Gait Training: 23-37 mins                     Erasmo Leventhal , PTA Acute Rehabilitation Services Pager [phone] Office [phone]     Charles Hart Charles Hart 11/16/2020, 1:15 PM

## 2020-11-17 LAB — RENAL FUNCTION PANEL
Albumin: 3.1 g/dL — ABNORMAL LOW (ref 3.5–5.0)
Anion gap: 17 — ABNORMAL HIGH (ref 5–15)
BUN: 96 mg/dL — ABNORMAL HIGH (ref 8–23)
CO2: 22 mmol/L (ref 22–32)
Calcium: 8.6 mg/dL — ABNORMAL LOW (ref 8.9–10.3)
Chloride: 89 mmol/L — ABNORMAL LOW (ref 98–111)
Creatinine, Ser: 14.07 mg/dL — ABNORMAL HIGH (ref 0.61–1.24)
GFR, Estimated: 4 mL/min — ABNORMAL LOW (ref >60)
Glucose, Bld: 168 mg/dL — ABNORMAL HIGH (ref 70–99)
Phosphorus: 8.3 mg/dL — ABNORMAL HIGH (ref 2.5–4.6)
Potassium: 3.7 mmol/L (ref 3.5–5.1)
Sodium: 128 mmol/L — ABNORMAL LOW (ref 135–145)

## 2020-11-17 LAB — CBC
HCT: 30 % — ABNORMAL LOW (ref 39.0–52.0)
Hemoglobin: 10 g/dL — ABNORMAL LOW (ref 13.0–17.0)
MCH: 31 pg (ref 26.0–34.0)
MCHC: 33.3 g/dL (ref 30.0–36.0)
MCV: 92.9 fL (ref 80.0–100.0)
Platelets: 250 10*3/uL (ref 150–400)
RBC: 3.23 MIL/uL — ABNORMAL LOW (ref 4.22–5.81)
RDW: 12.9 % (ref 11.5–15.5)
WBC: 8.8 10*3/uL (ref 4.0–10.5)
nRBC: 0 % (ref 0.0–0.2)

## 2020-11-17 LAB — GLUCOSE, CAPILLARY
Glucose-Capillary: 243 mg/dL — ABNORMAL HIGH (ref 70–99)
Glucose-Capillary: 96 mg/dL (ref 70–99)
Glucose-Capillary: 105 mg/dL — ABNORMAL HIGH (ref 70–99)
Glucose-Capillary: 137 mg/dL — ABNORMAL HIGH (ref 70–99)

## 2020-11-17 MED ORDER — CALCITRIOL 0.5 MCG PO CAPS
ORAL_CAPSULE | ORAL | Status: AC
  Administered 2020-11-17: 1 ug via ORAL
  Filled 2020-11-17: qty 2

## 2020-11-17 MED ORDER — HEPARIN SODIUM (PORCINE) 1000 UNIT/ML DIALYSIS: 20.0000 [IU]/kg | INTRAMUSCULAR | Status: DC | PRN

## 2020-11-17 NOTE — Social Work (Addendum)
[facility] is requesting a peer to peer with MD. The number for the MD to call is [phone] option 5. Peer to peer needs to be completed before 9am on 11/18/20. CSW has informed MD via secure chat.    4:30- [facility] requested additional clinicals, CSW faxed them over.   Emeterio Reeve, Latanya Presser, [place] Social Worker [phone]

## 2020-11-17 NOTE — Progress Notes (Signed)
PT Cancellation Note  Patient Details Name: Charles Hart MRN: [id number] DOB: Sep 03, 1955   Cancelled Treatment:    Reason Eval/Treat Not Completed: (P) Patient at procedure or test/unavailable (Pt in HD, will f/u per POC.)   Seema Blum J Deshler 11/17/2020, 10:55 AM  Erasmo Leventhal , PTA Acute Rehabilitation Services Pager [phone] Office [phone]

## 2020-11-17 NOTE — Progress Notes (Addendum)
  [facility] Progress Note   Subjective:  Seen on HD - 1L net UFG and tolerating. No CP or dyspnea. Says hasn't urinated for past 2 days - usually does at home. No abdominal pain, but sometimes with urgency feeling -> d/w patient will ask RN to do bladder scan once gets back to his room.  Objective Vitals:   11/17/20 0344 11/17/20 0720 11/17/20 0726 11/17/20 0731  BP: 111/61 99/60 (!) 118/58 (!) 100/59  Pulse: 72 72 72 73  Resp: 17 18    Temp: 97.7 F (36.5 C) 98 F (36.7 C)    TempSrc: Oral Oral    SpO2: 97% 98%    Weight:  90.5 kg    Height:       Physical Exam General: Well appearing man, NAD. Room air. Heart: RRR; no murmur Lungs: CTAB, no rales Abdomen: soft, non-tender, clean/dry/glued incisions to abdomen Extremities: No LE edema Dialysis Access: L forearm AVG + bruit  Additional Objective Labs: Basic Metabolic Panel: Recent Labs  Lab 11/12/20 1321 11/14/20 0857  NA 135 136  K 3.9 3.7  CL 88* 84*  CO2 28 30  GLUCOSE 120* 110*  BUN 62* 79*  CREATININE 12.30* 12.63*  CALCIUM 8.6* 8.1*  PHOS 9.7* 11.2*   Liver Function Tests: Recent Labs  Lab 11/12/20 1321 11/14/20 0857  ALBUMIN 3.6 3.4*   CBC: Recent Labs  Lab 11/10/20 1853 11/12/20 1321 11/14/20 0857 11/17/20 0748  WBC 11.0* 9.5 9.6 8.8  HGB 11.1* 10.5* 10.8* 10.0*  HCT 34.2* 32.8* 33.8* 30.0*  MCV 94.2 94.3 93.6 92.9  PLT 229 214 262 250   Medications:  . (feeding supplement) PROSource Plus  30 mL Oral BID BM  . calcitRIOL  1 mcg Oral Q T,Th,Sa-HD  . calcium acetate  1,334 mg Oral TID WC  . carvedilol  25 mg Oral BID WC  . Chlorhexidine Gluconate Cloth  6 each Topical Q0600  . enoxaparin (LOVENOX) injection  30 mg Subcutaneous Q24H  . gabapentin  300 mg Oral QHS  . hydrALAZINE  50 mg Oral BID  . pravastatin  40 mg Oral q1800    Dialysis Orders: TTS at [facility] 4hr, EDW 94.5kg, 2K/2.5Ca, UFP #4, L AVG, heparin 3000 - Calcitriol 61mcg PO q HD - Mircera 73mcg IV q 2 weeks  - last 10/17/20.  Assessment/Plan: 1. Incisional Ventral Hernia s/p surgical repair with mesh on 3/28: Per surgery. 2. ESRD: Continue HD per usual TTS schedule - HD now, 1L UFG. 3. HTN/volume: BP low side - no edema. Below prior EDW - will change on d/c. Hydralazine reduced from 100mg  TID -> 50mg  BID yesterday, will d/c today. 4. Anemia of ESRD: Hgb 10 - following without ESA for now. 5. Secondary hyperparathyroidism:  Ca ok, Phos sky high - looks like binders just restarted  - follow for now. 6. Nutrition: Alb low, continue protein supplement. 7. ?Urinary retention: See HPI - bladder scan with I/O cath ordered.  Veneta Penton, PA-C 11/17/2020, 8:35 AM  [employer]

## 2020-11-17 NOTE — Progress Notes (Signed)
Physical Therapy Treatment Patient Details Name: Charles Hart MRN: [id number] DOB: 1955/09/27 Today's Date: 11/17/2020    History of Present Illness Pt is a 65 y/o male s/p hernia repair on 3/28. PMH includes kidney cancer, ESRD on HD, CVA, nonischemic cardiomopathy, DM, and HTN.    PT Comments    Patient seen for gait and balance training with use of single point cane. Patient sleeping on arrival, but despite fatigue wanted to try to walk with cane. Required vc for correct use and pt would revert to "his way" when not cued. Required standing rest break after 120 ft--had dialysis this morning and recently worked with OT. Requested to defer further standing/balance activities on return to room due to fatigue.    Follow Up Recommendations  SNF     Equipment Recommendations  Rolling walker with 5" wheels;3in1 (PT)    Recommendations for Other Services       Precautions / Restrictions Precautions Precautions: Fall Precaution Comments: Abdominal binder Restrictions Weight Bearing Restrictions: No    Mobility  Bed Mobility Overal bed mobility: Needs Assistance Bed Mobility: Rolling;Sidelying to Sit;Sit to Sidelying Rolling: Supervision Sidelying to sit: Supervision (with rail; HOB flat)     Sit to sidelying: Supervision General bed mobility comments: vc for each movement to minimize strain on abdomen    Transfers Overall transfer level: Needs assistance Equipment used: Straight cane Transfers: Sit to/from Stand Sit to Stand: Min guard         General transfer comment: min guard from EOB  Ambulation/Gait Ambulation/Gait assistance: Min guard Gait Distance (Feet): 120 Feet (standing rest (pt request); 120 ft back) Assistive device: Straight cane Gait Pattern/deviations: Step-through pattern;Decreased stride length Gait velocity: decreased   General Gait Details: Utilized cane poorly (tends to place it almost behind him) when he advances cane; educated on proper  placement for incr support and pt return demonstrated for ~4 steps and then reverted to "his way." Practiced rt and left turns without imbalance   Stairs             Wheelchair Mobility    Modified Rankin (Stroke Patients Only)       Balance Overall balance assessment: Needs assistance Sitting-balance support: Feet supported;No upper extremity supported Sitting balance-Leahy Scale: Fair Sitting balance - Comments: ate lunch sitting EOb   Standing balance support: Single extremity supported;Bilateral upper extremity supported;During functional activity Standing balance-Leahy Scale: Poor Standing balance comment: needs at least single UE support                            Cognition Arousal/Alertness: Awake/alert (a bit sleepy but easily arousable) Behavior During Therapy: Flat affect;WFL for tasks assessed/performed Overall Cognitive Status: Within Functional Limits for tasks assessed                                 General Comments: Not specifically challenged      Exercises      General Comments        Pertinent Vitals/Pain Pain Assessment: Faces Faces Pain Scale: Hurts little more Pain Location: abdomen Pain Descriptors / Indicators: Operative site guarding Pain Intervention(s): Limited activity within patient's tolerance    Home Living Family/patient expects to be discharged to:: Private residence Living Arrangements: Alone Available Help at Discharge: Family;Available PRN/intermittently Type of Home: Apartment Home Access: Stairs to enter;Elevator Entrance Stairs-Rails: Right Home Layout: One level Home Equipment: Kasandra Knudsen -  single point      Prior Function Level of Independence: Independent with assistive device(s)      Comments: Occasional use of cane   PT Goals (current goals can now be found in the care plan section) Acute Rehab PT Goals Patient Stated Goal: to be able to move better before going home Time For Goal  Achievement: 11/25/20 Potential to Achieve Goals: Good Progress towards PT goals: Progressing toward goals    Frequency    Min 3X/week      PT Plan Current plan remains appropriate    Co-evaluation              AM-PAC PT "6 Clicks" Mobility   Outcome Measure  Help needed turning from your back to your side while in a flat bed without using bedrails?: A Little Help needed moving from lying on your back to sitting on the side of a flat bed without using bedrails?: A Little Help needed moving to and from a bed to a chair (including a wheelchair)?: A Little Help needed standing up from a chair using your arms (e.g., wheelchair or bedside chair)?: A Little Help needed to walk in hospital room?: A Little Help needed climbing 3-5 steps with a railing? : A Little 6 Click Score: 18    End of Session Equipment Utilized During Treatment: Gait belt Activity Tolerance: Patient limited by fatigue Patient left: with call bell/phone within reach;in bed;with bed alarm set   PT Visit Diagnosis: Other abnormalities of gait and mobility (R26.89);Muscle weakness (generalized) (M62.81);Difficulty in walking, not elsewhere classified (R26.2);Pain Pain - part of body:  (abdomen)     Time: 1540-0867 PT Time Calculation (min) (ACUTE ONLY): 16 min  Charges:  $Gait Training: 8-22 mins                      Arby Barrette, PT Pager [phone]    Rexanne Mano 11/17/2020, 3:04 PM

## 2020-11-17 NOTE — Progress Notes (Signed)
Occupational Therapy Treatment Patient Details Name: Charles Hart MRN: [id number] DOB: 1956/02/24 Today's Date: 11/17/2020    History of present illness Pt is a 66 y/o male s/p hernia repair on 3/28. PMH includes kidney cancer, ESRD on HD, CVA, nonischemic cardiomopathy, DM, and HTN.   OT comments  Pt progressing towards acute OT goals. Mostly min guard with occasional min A to steady during turns while navigating obstacles in the room. Completed house hold distance mobility. Reviewed LB ADL technique. D/c plan remains appropriate.    Follow Up Recommendations  SNF    Equipment Recommendations  Other (comment) (defer to next venue)    Recommendations for Other Services      Precautions / Restrictions Precautions Precautions: Fall Precaution Comments: Abdominal binder Restrictions Weight Bearing Restrictions: No       Mobility Bed Mobility Overal bed mobility: Needs Assistance Bed Mobility: Rolling;Sidelying to Sit;Sit to Sidelying Rolling: Min assist Sidelying to sit: Min assist     Sit to sidelying: Min guard General bed mobility comments: min A to boost hips to full sidelying position. Min A to powerup trunk.    Transfers Overall transfer level: Needs assistance Equipment used: 1 person hand held assist;Rolling walker (2 wheeled) Transfers: Sit to/from Stand Sit to Stand: Min guard         General transfer comment: min guard from EOB, light min A to stand from regular/lower seat surface. assist to stabilize rw coming from lower seat heights as pt pulling up on rw with both hands    Balance Overall balance assessment: Needs assistance Sitting-balance support: Feet supported;No upper extremity supported Sitting balance-Leahy Scale: Fair Sitting balance - Comments: ate lunch sitting EOb   Standing balance support: Single extremity supported;Bilateral upper extremity supported;During functional activity Standing balance-Leahy Scale: Poor Standing balance  comment: needs at least single UE support                           ADL either performed or assessed with clinical judgement   ADL Overall ADL's : Needs assistance/impaired Eating/Feeding: Set up;Sitting Eating/Feeding Details (indicate cue type and reason): ate lunch sitting EOB                     Toilet Transfer: Minimal assistance           Functional mobility during ADLs: Min guard;Minimal assistance General ADL Comments: Mostly close min guard to walk in the room (bathroom/household distances). Light min A 2/2 LOB on turns a couple of times. Pt utilized rw as no cane available. able to stand from regular seat surface with light min A to steady/boost.     Vision       Perception     Praxis      Cognition Arousal/Alertness: Awake/alert (a bit sleepy but easily arousable) Behavior During Therapy: Flat affect;WFL for tasks assessed/performed Overall Cognitive Status: No family/caregiver present to determine baseline cognitive functioning                                 General Comments: Sleepy on arrival of OT slow to gain full alertness. Making jokes today, more alert than last OT session. Cognition appears grossly intact. Answering questions appropriately. Some STM deficits        Exercises     Shoulder Instructions       General Comments      Pertinent Vitals/  Pain       Pain Assessment: Faces Faces Pain Scale: Hurts a little bit Pain Location: abdomen Pain Descriptors / Indicators: Grimacing;Operative site guarding Pain Intervention(s): Monitored during session  Home Living                                          Prior Functioning/Environment              Frequency  Min 2X/week        Progress Toward Goals  OT Goals(current goals can now be found in the care plan section)  Progress towards OT goals: Progressing toward goals  Acute Rehab OT Goals Patient Stated Goal: to be able to move  better before going home OT Goal Formulation: With patient Time For Goal Achievement: 11/26/20 Potential to Achieve Goals: Good ADL Goals Pt Will Perform Grooming: with min guard assist;sitting;with supervision Pt Will Perform Upper Body Dressing: with set-up;sitting;with supervision Pt Will Perform Lower Body Dressing: with mod assist;sit to/from stand Pt Will Transfer to Toilet: with max assist;stand pivot transfer Pt Will Perform Toileting - Clothing Manipulation and hygiene: with max assist;sit to/from stand;with mod assist;sitting/lateral leans Additional ADL Goal #1: Pt will complete bed mobility at min guard level to prepare for EOB/OOB ADLs.  Plan Discharge plan remains appropriate    Co-evaluation                 AM-PAC OT "6 Clicks" Daily Activity     Outcome Measure   Help from another person eating meals?: None Help from another person taking care of personal grooming?: None Help from another person toileting, which includes using toliet, bedpan, or urinal?: A Little Help from another person bathing (including washing, rinsing, drying)?: A Little Help from another person to put on and taking off regular upper body clothing?: A Little Help from another person to put on and taking off regular lower body clothing?: A Little 6 Click Score: 20    End of Session Equipment Utilized During Treatment: Other (comment);Rolling walker (abdominal binder)  OT Visit Diagnosis: Unsteadiness on feet (R26.81);Muscle weakness (generalized) (M62.81);Pain;Other symptoms and signs involving cognitive function   Activity Tolerance Patient tolerated treatment well   Patient Left in bed;with call bell/phone within reach;with bed alarm set   Nurse Communication          Time: 8250-5397 OT Time Calculation (min): 14 min  Charges: OT General Charges $OT Visit: 1 Visit OT Treatments $Self Care/Home Management : 8-22 mins  Tyrone Schimke, OT Acute Rehabilitation  Services Pager: [phone] Office: [phone]    Charles Hart 11/17/2020, 2:08 PM

## 2020-11-17 NOTE — Progress Notes (Signed)
8 Days Post-Op   Subjective/Chief Complaint: Pt with no complaints   Objective: Vital signs in last 24 hours: Temp:  [97.7 F (36.5 C)-98.8 F (37.1 C)] 97.8 F (36.6 C) (04/05 1057) Pulse Rate:  [68-113] 98 (04/05 1057) Resp:  [14-18] 14 (04/05 1057) BP: (90-122)/(51-84) 120/84 (04/05 1057) SpO2:  [95 %-100 %] 97 % (04/05 1057) Weight:  [89.5 kg-90.5 kg] 89.5 kg (04/05 1057) Last BM Date: 11/14/20  Intake/Output from previous day: 04/04 0701 - 04/05 0700 In: 240 [P.O.:240] Out: -  Intake/Output this shift: Total I/O In: -  Out: 1000 [Other:1000]  General appearance: alert and cooperative GI: soft, non-tender; bowel sounds normal; no masses,  no organomegaly and inc  c/d/i  Lab Results:  Recent Labs    11/17/20 0748  WBC 8.8  HGB 10.0*  HCT 30.0*  PLT 250   BMET Recent Labs    11/17/20 0748  NA 128*  K 3.7  CL 89*  CO2 22  GLUCOSE 168*  BUN 96*  CREATININE 14.07*  CALCIUM 8.6*   Assessment/Plan: s/p Procedure(s): OPEN INCISIONAL HERNIA REPAIR WITH MESH (N/A) INSERTION OF MESH (N/A) Doing well Ready for SNF when accepted.  LOS: 6 days    Ralene Ok 11/17/2020

## 2020-11-17 NOTE — Progress Notes (Signed)
Pt was bladder scanned and 15ml was found in his bladder. Patient refused in and out cath

## 2020-11-18 LAB — GLUCOSE, CAPILLARY
Glucose-Capillary: 103 mg/dL — ABNORMAL HIGH (ref 70–99)
Glucose-Capillary: 124 mg/dL — ABNORMAL HIGH (ref 70–99)
Glucose-Capillary: 141 mg/dL — ABNORMAL HIGH (ref 70–99)
Glucose-Capillary: 125 mg/dL — ABNORMAL HIGH (ref 70–99)

## 2020-11-18 MED ORDER — ACETAMINOPHEN 325 MG PO TABS: 650.0000 mg | ORAL_TABLET | Freq: Four times a day (QID) | ORAL | Status: DC | PRN

## 2020-11-18 NOTE — Progress Notes (Signed)
Called to give report

## 2020-11-18 NOTE — Progress Notes (Signed)
  [facility] Progress Note   Subjective:   Seen in room - no new complaints. Insurance not wanting to cover SNF - case manager working on it. Bladder scan from yesterday < 279mL urine.  Objective Vitals:   11/17/20 1201 11/17/20 2126 11/18/20 0430 11/18/20 0900  BP: 122/85 (!) 98/53 95/61 102/63  Pulse: (!) 103 99 76   Resp:  16 17   Temp: (!) 97.4 F (36.3 C) 98 F (36.7 C) 98.4 F (36.9 C)   TempSrc: Oral  Oral   SpO2: 100% 97% 99%   Weight:      Height:       Physical Exam General:Well appearing man, NAD. Room air. Heart:RRR; no murmur Lungs:CTAB, no rales Abdomen:soft, non-tender, clean/dry/glued incisions to abdomen Extremities:No LE edema Dialysis Access:L forearm AVG + bruit  Additional Objective Labs: Basic Metabolic Panel: Recent Labs  Lab 11/12/20 1321 11/14/20 0857 11/17/20 0748  NA 135 136 128*  K 3.9 3.7 3.7  CL 88* 84* 89*  CO2 28 30 22   GLUCOSE 120* 110* 168*  BUN 62* 79* 96*  CREATININE 12.30* 12.63* 14.07*  CALCIUM 8.6* 8.1* 8.6*  PHOS 9.7* 11.2* 8.3*   Liver Function Tests: Recent Labs  Lab 11/12/20 1321 11/14/20 0857 11/17/20 0748  ALBUMIN 3.6 3.4* 3.1*   CBC: Recent Labs  Lab 11/12/20 1321 11/14/20 0857 11/17/20 0748  WBC 9.5 9.6 8.8  HGB 10.5* 10.8* 10.0*  HCT 32.8* 33.8* 30.0*  MCV 94.3 93.6 92.9  PLT 214 262 250   Medications:  . (feeding supplement) PROSource Plus  30 mL Oral BID BM  . calcitRIOL  1 mcg Oral Q T,Th,Sa-HD  . calcium acetate  1,334 mg Oral TID WC  . carvedilol  25 mg Oral BID WC  . Chlorhexidine Gluconate Cloth  6 each Topical Q0600  . enoxaparin (LOVENOX) injection  30 mg Subcutaneous Q24H  . gabapentin  300 mg Oral QHS  . pravastatin  40 mg Oral q1800    Dialysis Orders: TTS at [facility] 4hr, EDW 94.5kg, 2K/2.5Ca, UFP #4, L AVG, heparin 3000 - Calcitriol 58mcg PO q HD - Mircera 45mcg IV q 2 weeks - last 10/17/20.  Assessment/Plan: 1.Incisional Ventral Hernia s/p surgical repair  with mesh on 3/28: Per surgery. 2. ESRD:Continue HD per usual TTS schedule - next HD 4/7. 3.HTN/volume:BP low side - no edema. Below prior EDW - will change on d/c. Hydralazine has been d/c'd.  May need to reduce Coreg dose as well. 4. Anemiaof ESRD:Hgb 10 - following without ESA for now. 5. Secondary hyperparathyroidism:Ca ok, Phos improving with binders being resumed. 6. Nutrition:Alb low, continue protein supplement.   Veneta Penton, PA-C 11/18/2020, 12:24 PM  [facility]

## 2020-11-18 NOTE — Progress Notes (Signed)
Called to give report. Waited on hold 10 minutes. Hung up to get to other patients in need.

## 2020-11-18 NOTE — Discharge Summary (Signed)
[facility] Discharge Summary   Patient ID: Charles Hart MRN: [id number] DOB/AGE: February 11, 1956 65 y.o.  Admit date: 11/09/2020 Discharge date: 11/18/2020  Admitting Diagnosis: Incisional hernia   Discharge Diagnosis Patient Active Problem List   Diagnosis Date Noted  . S/P hernia repair 11/09/2020  . Ventral hernia without obstruction or gangrene 05/26/2020  . Surgical pneumoperitoneum   . Trochanteric bursitis of right hip 04/25/2018  . Aortic atherosclerosis ([place]) 12/07/2017  . ESRD on hemodialysis ([place]) 11/24/2017  . S/P arteriovenous (AV) fistula creation 11/24/2017  . Dyspnea 11/08/2017  . History of renal cell carcinoma 09/06/2017  . Piriformis syndrome of right side 03/03/2016  . Muscle spasm of left shoulder area 03/03/2016  . Secondary hyperparathyroidism, renal ([place]) 02/09/2016  . Primary osteoarthritis of left knee 01/07/2016  . Low back pain 01/07/2016  . Joint stiffness 11/13/2015  . Age-related nuclear cataract of both eyes 10/07/2015  . Normocytic anemia 08/22/2014  . Health care maintenance 05/16/2014  . Nephrotic syndrome in diseases classified elsewhere 02/18/2013  . Obstructive sleep apnea 07/26/2012  . History of stroke 07/04/2012  . 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 ED (erectile dysfunction) 04/18/2012  . Benign neoplasm of colon 07/02/2011  . Chronic combined systolic and diastolic congestive heart failure ([place]) 05/21/2010  . Carotid bruit 04/01/2009  . Type 2 diabetes mellitus with diabetic neuropathy ([place]) 08/03/2007  . Hearing loss 07/30/2007  . Controlled type 2 diabetes mellitus with chronic kidney disease ([place]) 09/15/2006  . Other and unspecified hyperlipidemia 06/01/2006  . Essential (primary) hypertension 06/01/2006    Consultants Nephrology   Imaging: No results found.  Procedures Dr.  Jerilee Hoh (11/09/20) - Open rectorectus hernia repair with mesh   Hospital Course:  Patient is a 65 year old male who presented to [facility] with incisional hernia scheduled for elective hernia repair. Patient was admitted and underwent procedure listed above.  Tolerated procedure well and was transferred to the floor.  Diet was advanced as tolerated.  Nephrology was consulted to help with HD. Patient struggled with mobilization post-operatively and PT/OT were consulted and recommended SNF upon discharge. On POD#9, the patient was voiding well, tolerating diet, ambulating well, pain well controlled, vital signs stable, incisions c/d/i and felt stable for discharge to SNF per PT recommendations.  Patient will follow up in our office and knows to call with questions or concerns.  He will call to confirm appointment date/time.    I was not directly involved in patient's care, information in discharge summary obtained from patient chart.   Allergies as of 11/18/2020      Reactions   Ivp Dye [iodinated Diagnostic Agents] Other (See Comments)   Per patient's Nephrologist, he doesn't want the patient exposed to ANY dye because of issues with his kidneys   Hydrocodone    Caused hands and feet to peel    Phentermine Swelling   Amlodipine Swelling   Lisinopril Cough   Red Dye Other (See Comments)   NO dye of any kind (issues with his kidneys)      Medication List    TAKE these medications   acetaminophen 325 MG tablet Commonly known as: TYLENOL Take 2 tablets (650 mg total) by mouth every 6 (six) hours as needed for mild pain.   calcium acetate 667 MG capsule Commonly known as: PHOSLO Take 2,001 mg by mouth 3 (three) times daily with meals.  carvedilol 25 MG tablet Commonly known as: COREG Take 1 tablet (25 mg total) by mouth 2 (two) times daily with a meal.   fluticasone 50 MCG/ACT nasal spray Commonly known as: FLONASE Place 2 sprays into both nostrils as needed for allergies.   folic  acid-vitamin b complex-vitamin c-selenium-zinc 3 MG Tabs tablet Take 1 tablet by mouth daily.   furosemide 80 MG tablet Commonly known as: LASIX Take 1 tablet (80 mg total) by mouth 2 (two) times daily.   gabapentin 300 MG capsule Commonly known as: NEURONTIN Take 300 mg by mouth at bedtime.   hydrALAZINE 100 MG tablet Commonly known as: APRESOLINE TAKE 1 TABLET(100 MG) BY MOUTH THREE TIMES DAILY What changed: See the new instructions.   pravastatin 40 MG tablet Commonly known as: PRAVACHOL Take 1 tablet (40 mg total) by mouth daily.   sildenafil 50 MG tablet Commonly known as: VIAGRA Take 1 tablet (50 mg total) by mouth as needed for erectile dysfunction. What changed: when to take this   SUPER B COMPLEX/C PO Take 1 tablet by mouth daily.   VITAMIN B-12 PO Take 2,500 mcg by mouth daily.         Follow-up Information    Ralene Ok, MD. Schedule an appointment as soon as possible for a visit in 2 weeks.   Specialty: General Surgery Why: Post op visit Contact information: [place] [phone]               Signed: Norm Parcel , [facility] 11/18/2020, 3:26 PM Please see Amion for pager number during day hours 7:00am-4:30pm

## 2020-11-18 NOTE — Progress Notes (Signed)
9 Days Post-Op   Subjective/Chief Complaint: Doing well S/W insur co peer to peetr yesterday to discuss SNF placement   Objective: Vital signs in last 24 hours: Temp:  [97.4 F (36.3 C)-98.4 F (36.9 C)] 98.4 F (36.9 C) (04/06 0430) Pulse Rate:  [68-103] 76 (04/06 0430) Resp:  [14-18] 17 (04/06 0430) BP: (95-122)/(51-85) 95/61 (04/06 0430) SpO2:  [97 %-100 %] 99 % (04/06 0430) Weight:  [89.5 kg-90.5 kg] 89.5 kg (04/05 1057) Last BM Date: 11/14/20  Intake/Output from previous day: 04/05 0701 - 04/06 0700 In: 120 [P.O.:120] Out: 1000  Intake/Output this shift: Total I/O In: 120 [P.O.:120] Out: -   General appearance: alert and cooperative GI: soft, non-tender; bowel sounds normal; no masses,  no organomegaly  Lab Results:  Recent Labs    11/17/20 0748  WBC 8.8  HGB 10.0*  HCT 30.0*  PLT 250   BMET Recent Labs    11/17/20 0748  NA 128*  K 3.7  CL 89*  CO2 22  GLUCOSE 168*  BUN 96*  CREATININE 14.07*  CALCIUM 8.6*   PT/INR No results for input(s): LABPROT, INR in the last 72 hours. ABG No results for input(s): PHART, HCO3 in the last 72 hours.  Invalid input(s): PCO2, PO2  Studies/Results: No results found.  Anti-infectives: Anti-infectives (From admission, onward)   Start     Dose/Rate Route Frequency Ordered Stop   11/09/20 0600  ceFAZolin (ANCEF) IVPB 2g/100 mL premix        2 g 200 mL/hr over 30 Minutes Intravenous On call to O.R. 11/09/20 0556 11/09/20 0726      Assessment/Plan: s/p Procedure(s): OPEN INCISIONAL HERNIA REPAIR WITH MESH (N/A) INSERTION OF MESH (N/A) SNF vs hoem today based on insur decision  LOS: 7 days    Ralene Ok 11/18/2020

## 2020-11-18 NOTE — TOC Transition Note (Signed)
Transition of Care [facility]) - CM/SW Discharge Note   Patient Details  Name: Charles Hart MRN: [id number] Date of Birth: Oct 27, 1955  Transition of Care [facility]) CM/SW Contact:  Emeterio Reeve, [place] Phone Number: 11/18/2020, 3:34 PM   Clinical Narrative:     Patient will DC to: [facility]  Anticipated DC date: 11/18/20 Family notified: Pt will notify family Transport by: Corey Harold     Per MD patient ready for DC to [facility]. RN, patient, patient's family, and facility notified of DC. Discharge Summary sent to facility. DC packet on chart. Ambulance transport requested for patient.    RN to call report to [phone].  CSW will sign off for now as social work intervention is no longer needed. Please consult [name] again if new needs arise.   Final next level of care: Skilled Nursing Facility Barriers to Discharge: Barriers Resolved   Patient Goals and CMS Choice Patient states their goals for this hospitalization and ongoing recovery are:: to get better CMS Medicare.gov Compare Post Acute Care list provided to:: Patient Choice offered to / list presented to : Patient  Discharge Placement              Patient chooses bed at: Other - please specify in the comment section below: [facility]) Patient to be transferred to facility by: [place] Name of family member notified: Pt notified Patient and family notified of of transfer: 11/18/20  Discharge Plan and Services                                     Social Determinants of Health (SDOH) Interventions     Readmission Risk Interventions No flowsheet data found.   Emeterio Reeve, Latanya Presser, [place] Social Worker [phone]

## 2020-11-19 NOTE — Progress Notes (Signed)
[facility] here to take patient at this time, all discharge instructions given to transport, IV dc'd with pressure drsg applied to right hand. Belongings at side upon discharge.

## 2020-11-20 ENCOUNTER — Telehealth: Payer: Self-pay

## 2020-11-20 NOTE — Telephone Encounter (Signed)
Transition Care Management Unsuccessful Follow-up Telephone Call  Date of discharge and from where:  11/19/2020 from [facility]  Attempts:  1st Attempt  Reason for unsuccessful TCM follow-up call:  Left voice message     

## 2020-11-23 NOTE — Telephone Encounter (Signed)
Transition Care Management Unsuccessful Follow-up Telephone Call  Date of discharge and from where:  11/19/2020 from [facility]  Attempts:  2nd Attempt  Reason for unsuccessful TCM follow-up call:  Unable to reach patient   Pt dc'ed to SNF.

## 2020-11-30 ENCOUNTER — Other Ambulatory Visit: Payer: Self-pay

## 2020-11-30 NOTE — Telephone Encounter (Signed)
Nephrology recommended discontinuation of Lasix during last admission.  D/C summary does not to continue but I agree with Nephrology, lasix not needed given ESRD on HD.

## 2020-12-01 ENCOUNTER — Telehealth: Payer: Self-pay

## 2020-12-01 NOTE — Telephone Encounter (Signed)
Kim with [facility], want to informed the office that [facility] unable to take patient for home health.

## 2020-12-02 ENCOUNTER — Other Ambulatory Visit: Payer: Self-pay

## 2020-12-03 MED ORDER — HYDRALAZINE HCL 100 MG PO TABS: ORAL_TABLET | ORAL | 0 refills | Status: DC

## 2020-12-07 NOTE — H&P (Signed)
History of Present Illness The patient is a 65 year old male who presents with an incisional hernia. Referred by: Dr. Heber Atlantic Chief Complaint: Incisional hernia  Patient is a 65 year old male with a history of hypertension, end-stage renal disease on dialysis Tuesday Thursday Saturday, diabetes, who comes in with an incisional hernia. Patient underwent robotic-assisted left nephrectomy. This was by Dr. Bess Harvest in October 2019. Subsequent patient states that he noticed a bulge in the incision site several months thereafter. He states that he has no pain at the site currently. Patient wishes to be on the transplant list at [facility] in states they're require at the hernia repaired prior to being listed for transplant.  Patient currently works in food delivery. He's had no signs or symptoms of incarceration or strangulation.  Patient has had clearance by the internal medicine clinic.    Allergies Iodinated Contrast Media  HYDROcodone Bitartrate *CHEMICALS*  Phentermine HCl *CHEMICALS*  AmLODIPine &Diet Manage Prod *CALCIUM CHANNEL BLOCKERS*  Lisinopril *ANTIHYPERTENSIVES*  Dye FDC Red 40 (Carmine Red) *PHARMACEUTICAL ADJUVANTS*  Allergies Reconciled   Medication History Carvedilol (25MG  Tablet, Oral) Active. Furosemide (80MG  Tablet, Oral) Active. hydrALAZINE HCl (100MG  Tablet, Oral) Active. Pravastatin Sodium (40MG  Tablet, Oral) Active. Viagra (50MG  Tablet, Oral) Active. Flonase Allergy Relief (50MCG/ACT Suspension, Nasal) Active. Medications Reconciled    Review of Systems All other systems negative     Physical Exam  The physical exam findings are as follows: Note: Constitutional: No acute distress, conversant, appears stated age  Eyes: Anicteric sclerae, moist conjunctiva, no lid lag  Neck: No thyromegaly, trachea midline, no cervical lymphadenopathy  Lungs: Clear to auscultation biilaterally, normal respiratory effot  Cardiovascular:  regular rate & rhythm, no murmurs, no peripheal edema, pedal pulses 2+  GI: Soft, no masses or hepatosplenomegaly, non-tender to palpation  MSK: Normal gait, no clubbing cyanosis, edema  Skin: No rashes, palpation reveals normal skin turgor  Psychiatric: Appropriate judgment and insight, oriented to person, place, and time  Abdomen Inspection Hernias - Incisional - Reducible (Approximate 5 cm in width) .    Assessment & Plan INCISIONAL HERNIA, WITHOUT OBSTRUCTION OR GANGRENE (K43.2) Impression: Patient is a 65 year old male, with history of end-stage renal disease, hypertension, diabetes, comes in with an incisional hernia status post left nephrectomy.  1. The patient will like to proceed to the operating room for open incisional hernia repair with mesh.  2. I discussed with the patient the signs and symptoms of incarceration and strangulation and the need to proceed to the ER should they occur.  3. I discussed with the patient the risks and benefits of the procedure to include but not limited to: Infection, bleeding, damage to surrounding structures, possible need for further surgery, possible nerve pain, and possible recurrence. The patient was understanding and wishes to proceed.  I reviewed the patient's external notes from the referring physicians as well as consulting physician team. Each of the radiologic studies and lab studies were independently reviewed and interpreted. I discussed the results of the above studies and how they relate to the patient's surgical problems.

## 2020-12-12 ENCOUNTER — Other Ambulatory Visit: Payer: Self-pay

## 2020-12-12 ENCOUNTER — Emergency Department (HOSPITAL_COMMUNITY)
Admission: EM | Admit: 2020-12-12 | Discharge: 2020-12-12 | Disposition: A | Discharge status: Home / Self Care | Payer: 59 | Attending: Emergency Medicine | Admitting: Emergency Medicine

## 2020-12-12 ENCOUNTER — Emergency Department (HOSPITAL_COMMUNITY): Payer: 59

## 2020-12-12 ENCOUNTER — Encounter (HOSPITAL_COMMUNITY): Payer: Self-pay

## 2020-12-12 DIAGNOSIS — Z992 Dependence on renal dialysis: Secondary | ICD-10-CM | POA: Insufficient documentation

## 2020-12-12 DIAGNOSIS — R42 Dizziness and giddiness: Secondary | ICD-10-CM | POA: Diagnosis present

## 2020-12-12 DIAGNOSIS — E1140 Type 2 diabetes mellitus with diabetic neuropathy, unspecified: Secondary | ICD-10-CM | POA: Diagnosis not present

## 2020-12-12 DIAGNOSIS — Z85528 Personal history of other malignant neoplasm of kidney: Secondary | ICD-10-CM | POA: Insufficient documentation

## 2020-12-12 DIAGNOSIS — E1122 Type 2 diabetes mellitus with diabetic chronic kidney disease: Secondary | ICD-10-CM | POA: Insufficient documentation

## 2020-12-12 DIAGNOSIS — I132 Hypertensive heart and chronic kidney disease with heart failure and with stage 5 chronic kidney disease, or end stage renal disease: Secondary | ICD-10-CM | POA: Diagnosis not present

## 2020-12-12 DIAGNOSIS — I5042 Chronic combined systolic (congestive) and diastolic (congestive) heart failure: Secondary | ICD-10-CM | POA: Diagnosis not present

## 2020-12-12 DIAGNOSIS — N186 End stage renal disease: Secondary | ICD-10-CM | POA: Diagnosis not present

## 2020-12-12 LAB — COMPREHENSIVE METABOLIC PANEL
ALT: 9 U/L (ref 0–44)
AST: 14 U/L — ABNORMAL LOW (ref 15–41)
Albumin: 4.2 g/dL (ref 3.5–5.0)
Alkaline Phosphatase: 45 U/L (ref 38–126)
Anion gap: 10 (ref 5–15)
BUN: 12 mg/dL (ref 8–23)
CO2: 33 mmol/L — ABNORMAL HIGH (ref 22–32)
Calcium: 8.6 mg/dL — ABNORMAL LOW (ref 8.9–10.3)
Chloride: 93 mmol/L — ABNORMAL LOW (ref 98–111)
Creatinine, Ser: 6.26 mg/dL — ABNORMAL HIGH (ref 0.61–1.24)
GFR, Estimated: 9 mL/min — ABNORMAL LOW (ref >60)
Glucose, Bld: 114 mg/dL — ABNORMAL HIGH (ref 70–99)
Potassium: 3.4 mmol/L — ABNORMAL LOW (ref 3.5–5.1)
Sodium: 136 mmol/L (ref 135–145)
Total Bilirubin: 0.8 mg/dL (ref 0.3–1.2)
Total Protein: 7.7 g/dL (ref 6.5–8.1)

## 2020-12-12 LAB — CBC WITH DIFFERENTIAL/PLATELET
Abs Immature Granulocytes: 0.02 10*3/uL (ref 0.00–0.07)
Basophils Absolute: 0 10*3/uL (ref 0.0–0.1)
Basophils Relative: 1 %
Eosinophils Absolute: 0.2 10*3/uL (ref 0.0–0.5)
Eosinophils Relative: 6 %
HCT: 36.9 % — ABNORMAL LOW (ref 39.0–52.0)
Hemoglobin: 11.7 g/dL — ABNORMAL LOW (ref 13.0–17.0)
Immature Granulocytes: 1 %
Lymphocytes Relative: 39 %
Lymphs Abs: 1.4 10*3/uL (ref 0.7–4.0)
MCH: 29.9 pg (ref 26.0–34.0)
MCHC: 31.7 g/dL (ref 30.0–36.0)
MCV: 94.4 fL (ref 80.0–100.0)
Monocytes Absolute: 0.5 10*3/uL (ref 0.1–1.0)
Monocytes Relative: 14 %
Neutro Abs: 1.5 10*3/uL — ABNORMAL LOW (ref 1.7–7.7)
Neutrophils Relative %: 39 %
Platelets: 200 10*3/uL (ref 150–400)
RBC: 3.91 MIL/uL — ABNORMAL LOW (ref 4.22–5.81)
RDW: 13.4 % (ref 11.5–15.5)
WBC: 3.7 10*3/uL — ABNORMAL LOW (ref 4.0–10.5)
nRBC: 0 % (ref 0.0–0.2)

## 2020-12-12 LAB — MAGNESIUM: Magnesium: 2.1 mg/dL (ref 1.7–2.4)

## 2020-12-12 MED ORDER — MECLIZINE HCL 12.5 MG PO TABS: 12.5000 mg | ORAL_TABLET | Freq: Three times a day (TID) | ORAL | 0 refills | Status: DC | PRN

## 2020-12-12 MED ORDER — MECLIZINE HCL 12.5 MG PO TABS
12.5000 mg | ORAL_TABLET | Freq: Once | ORAL | Status: AC
  Administered 2020-12-12: 12.5 mg via ORAL
  Filled 2020-12-12: qty 1

## 2020-12-12 NOTE — Discharge Instructions (Signed)
You were seen in the emerge department today with vertigo symptoms.  I have called in a prescription to your pharmacy for this medication that improved your symptoms today called meclizine.  I have listed the name of an ear nose and throat doctor and attached some information regarding how to treat this medication at home.  When you return home please take your prescription medicines including her Coreg as prescribed.  Return to the emergency department for any severe vertigo not improving with meclizine, weakness/numbness, confusion, vision change, chest pain, or other severe symptoms.

## 2020-12-12 NOTE — ED Notes (Signed)
Patient vomiting at this time.

## 2020-12-12 NOTE — ED Triage Notes (Signed)
Pt to er, pt states that yesterday after work he was in bed and when he would roll over and turn his head he would get dizzy, states that today after dialysis the room was spinning.  States that he hasn't been feeling right since his hernia repair on the 18th

## 2020-12-12 NOTE — ED Provider Notes (Signed)
Emergency Department Provider Note   I have reviewed the triage vital signs and the nursing notes.   HISTORY  Chief Complaint Dizziness   HPI Charles Hart is a 65 y.o. male with PMH reviewed below including ESRD with last HD today presents to the emergency department for evaluation of vertigo type symptoms.  He states symptoms began yesterday afternoon.  He was laying down after work when he turned his head to either direction he felt a spinning/dizzy feeling.  He notes it feels like room spinning type sensation.  He is not having any tinnitus or ear pain.  No vision changes.  No weakness or numbness.  No speech difficulty.  When at rest his symptoms resolve.  Denies prior history of vertigo.  He went along with his evening and went to dialysis today.  He states after dialysis he felt like the room was spinning and shifting, again with movement.  He felt a little unsteady with initially getting up but that has resolved.  He notes that as Kristal Perl as he keeps his head still he is relatively asymptomatic.  He has had an episode of vomiting since arrival to the emergency department.  He is not having abdominal pain or back pain.  No chest discomfort.    Past Medical History:  Diagnosis Date  . Adenomatous colon polyp 07/02/2011   Last colonoscopy May 06, 2011 by Dr. Owens Loffler, who recommended repeat colonoscopy in 5 years.   . Anemia   . Background diabetic retinopathy 04/20/2012   Patient is followed by Dr. Katy Fitch   . Cancer ([place])    Kidney  . Cardiomyopathy    LV function improved from 2004 to 2008.  Historically, moderately dilated LV with EF 30-40% by 2D echo 08/14/2002.  Mild CAD with severe LV dysfunction by cardiac cath 09/2002.  Normal coronary arteries and normal LV function by cardiac cath 09/19/2006.  A 2-D echo on 04/01/2009 showed mild concentric hypertrophy and normal systolic (LVEF  38-46%) and doppler C/W with grade 1 diastolic dysfunction.  . CHF (congestive heart  failure) ([place])    LV function improved from 2004 to 2008.  Historically, moderately dilated LV with EF 30-40% by 2D echo 08/14/2002.  Mild CAD with severe LV dysfunction by cardiac cath 09/2002.  Normal coronary arteries and normal LV function by cardiac cath 09/19/2006.  A 2-D echo on 04/01/2009 showed mild concentric hypertrophy and normal systolic (LVEF  65-99%) and doppler C/W with grade 1 diastolic dysfunction..   . Chronic combined systolic and diastolic congestive heart failure ([place]) 05/21/2010   LV function improved from 2004 to 2008.  Historically, moderately dilated LV with EF 30-40% by 2D echo 08/14/2002.  Mild CAD with severe LV dysfunction by cardiac cath 09/2002.  Normal coronary arteries and normal LV function by cardiac cath 09/19/2006.  A 2-D echo on 04/01/2009 showed mild concentric hypertrophy and normal systolic (LVEF  35-70%) and doppler parameters consistent with abnormal left   . CVA (cerebral vascular accident) ([place]) 07/04/2012   MRI of the brain 07/04/2012 showed an acute infarct in the right basal ganglia involving the anterior putamen, anterior limb internal capsule, and head of the caudate; this measured approximately 2.5 cm in diameter.     . Dermatitis   . Diabetes mellitus    type 2  . DIABETIC PERIPHERAL NEUROPATHY 08/03/2007   Qualifier: Diagnosis of  By: Marinda Elk MD, Sonia Side    . DM neuropathy, painful (HCC)    fingers and right knee  .  ESRD (end stage renal disease) ([place])    Stage 4, on hemodailysis x 4 months as of 05-18-18 [place] [facility], tues thurs sat  . ESRD (end stage renal disease) on dialysis ([place])    "TTS; [facility]" (05/31/2018)  . Hearing loss in right ear   . Hyperlipidemia   . Hypertension   . Hypertensive crisis 07/28/2012  . Hypertensive urgency 08/20/2014  . Myocardial infarction [facility])  many yrs ago  . Nephrotic syndrome 02/18/2013   A 24-hour urine collection 03/04/2013 showed total protein of 5,460 g and creatinine clearance of 80 mL/minute.  Patient  was seen by Seward Meth at [facility] and a repeat 24-hour urine showed 10,407 mg protein.  Patient underwent kidney biopsy on 05/30/2013; pathology showed advanced diffuse and nodular diabetic nephropathy with vascular changes consistent with Michaelina Blandino-standing difficult to control hypertension.     . Pneumonia 2014 and 2015  . Renal insufficiency   . Sleep apnea    11/06/20 - hasn't used it "in a while"    Patient Active Problem List   Diagnosis Date Noted  . S/P hernia repair 11/09/2020  . Ventral hernia without obstruction or gangrene 05/26/2020  . Surgical pneumoperitoneum   . Trochanteric bursitis of right hip 04/25/2018  . Aortic atherosclerosis ([place]) 12/07/2017  . ESRD on hemodialysis ([place]) 11/24/2017  . S/P arteriovenous (AV) fistula creation 11/24/2017  . Dyspnea 11/08/2017  . History of renal cell carcinoma 09/06/2017  . Piriformis syndrome of right side 03/03/2016  . Muscle spasm of left shoulder area 03/03/2016  . Secondary hyperparathyroidism, renal ([place]) 02/09/2016  . Primary osteoarthritis of left knee 01/07/2016  . Low back pain 01/07/2016  . Joint stiffness 11/13/2015  . Age-related nuclear cataract of both eyes 10/07/2015  . Normocytic anemia 08/22/2014  . Health care maintenance 05/16/2014  . Nephrotic syndrome in diseases classified elsewhere 02/18/2013  . Obstructive sleep apnea 07/26/2012  . History of stroke 07/04/2012  . Controlled type 2 diabetes mellitus with both eyes affected by proliferative retinopathy without macular edema, with Mallory Enriques-term current use of insulin ([place]) 04/20/2012  . ED (erectile dysfunction) 04/18/2012  . Benign neoplasm of colon 07/02/2011  . Chronic combined systolic and diastolic congestive heart failure ([place]) 05/21/2010  . Carotid bruit 04/01/2009  . Type 2 diabetes mellitus with diabetic neuropathy ([place]) 08/03/2007  . Hearing loss 07/30/2007  . Controlled type 2 diabetes mellitus with chronic kidney  disease ([place]) 09/15/2006  . Other and unspecified hyperlipidemia 06/01/2006  . Essential (primary) hypertension 06/01/2006    Past Surgical History:  Procedure Laterality Date  . AV FISTULA PLACEMENT Left 11/24/2017   Procedure: INSERTION OF ARTERIOVENOUS (AV) GORE-TEX GRAFT LEFT LOWER ARM;  Surgeon: Rosetta Posner, MD;  Location: [facility];  Service: Vascular;  Laterality: Left;  . CARDIAC CATHETERIZATION      4 times  . COLONOSCOPY    . COLONOSCOPY WITH PROPOFOL N/A 06/18/2020   Procedure: COLONOSCOPY WITH PROPOFOL;  Surgeon: Milus Banister, MD;  Location: [facility];  Service: Endoscopy;  Laterality: N/A;  . FOOT SURGERY    . INCISIONAL HERNIA REPAIR N/A 11/09/2020   Procedure: OPEN INCISIONAL HERNIA REPAIR WITH MESH;  Surgeon: Ralene Ok, MD;  Location: [place];  Service: General;  Laterality: N/A;  . INSERTION OF MESH N/A 11/09/2020   Procedure: INSERTION OF MESH;  Surgeon: Ralene Ok, MD;  Location: [place];  Service: General;  Laterality: N/A;  . POLYPECTOMY    . POLYPECTOMY  06/18/2020   Procedure: POLYPECTOMY;  Surgeon: Milus Banister, MD;  Location: [facility];  Service: Endoscopy;;  . ROBOT ASSISTED LAPAROSCOPIC NEPHRECTOMY Left 05/30/2018   Procedure: XI ROBOTIC ASSISTED LAPAROSCOPIC NEPHRECTOMY;  Surgeon: Alexis Frock, MD;  Location: [facility];  Service: Urology;  Laterality: Left;    Allergies Ivp dye [iodinated diagnostic agents], Hydrocodone, Phentermine, Amlodipine, Lisinopril, and Red dye  Family History  Problem Relation Age of Onset  . Aneurysm Father 74       died of rupture  . Colon cancer Sister     Social History Social History   Tobacco Use  . Smoking status: Never Smoker  . Smokeless tobacco: Never Used  Vaping Use  . Vaping Use: Never used  Substance Use Topics  . Alcohol use: No    Alcohol/week: 0.0 standard drinks  . Drug use: No    Review of Systems  Constitutional: No fever/chills Eyes: No visual changes. ENT: No sore throat.  Positive vertigo symptoms.  Cardiovascular: Denies chest pain. Respiratory: Denies shortness of breath. Gastrointestinal: No abdominal pain. Positive nausea and vomiting.  No diarrhea.  No constipation. Genitourinary: Negative for dysuria. Musculoskeletal: Negative for back pain. Skin: Negative for rash. Neurological: Negative for headaches, focal weakness or numbness.  10-point ROS otherwise negative.  ____________________________________________   PHYSICAL EXAM:  VITAL SIGNS: ED Triage Vitals [12/12/20 2023]  Enc Vitals Group     BP (!) 140/111     Pulse Rate (!) 113     Resp 18     Temp 98.7 F (37.1 C)     Temp Source Oral     SpO2 97 %     Weight 194 lb (88 kg)     Height 5\' 9"  (1.753 m)   Constitutional: Alert and oriented. Well appearing and in no acute distress. Eyes: Conjunctivae are normal. PERRL. EOMI. Head: Atraumatic. Nose: No congestion/rhinnorhea. Mouth/Throat: Mucous membranes are moist.   Ears: Normal TMs bilaterally without effusions or bulging.  Neck: No stridor.   Cardiovascular: Sinus tachycardia. Good peripheral circulation. Grossly normal heart sounds.   Respiratory: Normal respiratory effort.  No retractions. Lungs CTAB. Gastrointestinal: Soft and nontender. No distention.  Musculoskeletal: No lower extremity tenderness nor edema. No gross deformities of extremities. Neurologic:  Normal speech and language. No gross focal neurologic deficits are appreciated.  No facial asymmetry or numbness.  Patient with 5/5 strength in the bilateral upper and lower extremities without drift.  Normal finger-to-nose testing.  Normal heel-to-shin testing. Skin:  Skin is warm, dry and intact. No rash noted.  ____________________________________________   LABS (all labs ordered are listed, but only abnormal results are displayed)  Labs Reviewed  CBC WITH DIFFERENTIAL/PLATELET - Abnormal; Notable for the following components:      Result Value   WBC 3.7 (*)     RBC 3.91 (*)    Hemoglobin 11.7 (*)    HCT 36.9 (*)    Neutro Abs 1.5 (*)    All other components within normal limits  COMPREHENSIVE METABOLIC PANEL - Abnormal; Notable for the following components:   Potassium 3.4 (*)    Chloride 93 (*)    CO2 33 (*)    Glucose, Bld 114 (*)    Creatinine, Ser 6.26 (*)    Calcium 8.6 (*)    AST 14 (*)    GFR, Estimated 9 (*)    All other components within normal limits  MAGNESIUM   ____________________________________________  EKG   EKG Interpretation  Date/Time:  Saturday December 12 2020 20:37:57 EDT Ventricular Rate:  111 PR Interval:  166 QRS Duration: 114 QT Interval:  365 QTC Calculation: 496 R Axis:   25 Text Interpretation: Ectopic atrial tachycardia, unifocal Abnormal R-wave progression, early transition LVH with IVCD and secondary repol abnrm Borderline prolonged QT interval Confirmed by Nanda Quinton [phone]) on 12/12/2020 9:30:47 PM       ____________________________________________  RADIOLOGY  CT Head Wo Contrast  Result Date: 12/12/2020 CLINICAL DATA:  Dizziness with nausea and vomiting. EXAM: CT HEAD WITHOUT CONTRAST TECHNIQUE: Contiguous axial images were obtained from the base of the skull through the vertex without intravenous contrast. COMPARISON:  November 13, 2014 FINDINGS: Brain: There is mild cerebral atrophy with widening of the extra-axial spaces and ventricular dilatation. There are areas of decreased attenuation within the white matter tracts of the supratentorial brain, consistent with microvascular disease changes. A chronic right basal ganglia infarct is seen. This is present on the prior study. Vascular: No hyperdense vessel or unexpected calcification. Skull: Normal. Negative for fracture or focal lesion. Sinuses/Orbits: No acute finding. Other: None. IMPRESSION: 1. Generalized cerebral atrophy. 2. Chronic right basal ganglia lacunar infarct. 3. No acute intracranial abnormality. Electronically Signed   By: Virgina Norfolk M.D.   On: 12/12/2020 22:00    ____________________________________________   PROCEDURES  Procedure(s) performed:   Procedures  None  ____________________________________________   INITIAL IMPRESSION / ASSESSMENT AND PLAN / ED COURSE  Pertinent labs & imaging results that were available during my care of the patient were reviewed by me and considered in my medical decision making (see chart for details).   Patient presents emergency department for evaluation of vertigo symptoms.  His history and exam seem most consistent with peripheral vertigo.  He does have prior history of stroke and is at elevated risk for central vertigo process.  His neuro exam is intact here.  He is having minimal symptoms without movement although some vomiting here.  Abdomen is soft and nontender.  Plan for screening lab work, CT imaging of the head with symptoms starting yesterday, and reassess after meclizine.  He is well outside any window for tPA or intervention on LVO and exam is not consistent with this process.   On reassessment the patient has had complete resolution of symptoms after meclizine.  CT imaging of the head shows no acute findings.  Lab work is reassuring.  He does have some mild tachycardia here but did miss his dose of Coreg today x 2.  No indication for emergent dialysis here.  No findings on exam to suspect infection.  Plan for meclizine at home with ENT follow-up and continue dialysis as scheduled. Discussed ED return precautions.  ____________________________________________  FINAL CLINICAL IMPRESSION(S) / ED DIAGNOSES  Final diagnoses:  Vertigo     MEDICATIONS GIVEN DURING THIS VISIT:  Medications  meclizine (ANTIVERT) tablet 12.5 mg (12.5 mg Oral Given 12/12/20 2055)     NEW OUTPATIENT MEDICATIONS STARTED DURING THIS VISIT:  Discharge Medication List as of 12/12/2020 10:38 PM    START taking these medications   Details  meclizine (ANTIVERT) 12.5 MG tablet Take 1  tablet (12.5 mg total) by mouth 3 (three) times daily as needed for dizziness., Starting Sat 12/12/2020, Normal        Note:  This document was prepared using Dragon voice recognition software and may include unintentional dictation errors.  Nanda Quinton, MD, [facility]    Harriet Bollen, [facility], MD 12/12/20 [phone]

## 2020-12-14 ENCOUNTER — Telehealth: Payer: Self-pay

## 2020-12-14 NOTE — Telephone Encounter (Signed)
Transition Care Management Unsuccessful Follow-up Telephone Call  Date of discharge and from where:  12/12/2020 from [facility]  Attempts:  1st Attempt  Reason for unsuccessful TCM follow-up call:  Left voice message

## 2020-12-15 NOTE — Telephone Encounter (Signed)
Transition Care Management Unsuccessful Follow-up Telephone Call  Date of discharge and from where:  12/12/2020 from [facility]  Attempts:  2nd Attempt  Reason for unsuccessful TCM follow-up call:  Unable to leave message

## 2020-12-16 NOTE — Telephone Encounter (Signed)
Transition Care Management Unsuccessful Follow-up Telephone Call  Date of discharge and from where:  12/12/2020 from [facility]  Attempts:  3rd Attempt  Reason for unsuccessful TCM follow-up call:  Unable to reach patient

## 2021-02-16 ENCOUNTER — Encounter: Payer: Self-pay | Admitting: *Deleted

## 2021-03-06 ENCOUNTER — Emergency Department (HOSPITAL_COMMUNITY): Payer: 59

## 2021-03-06 ENCOUNTER — Encounter (HOSPITAL_COMMUNITY): Payer: Self-pay | Admitting: Emergency Medicine

## 2021-03-06 ENCOUNTER — Other Ambulatory Visit: Payer: Self-pay

## 2021-03-06 ENCOUNTER — Emergency Department (HOSPITAL_COMMUNITY)
Admission: EM | Admit: 2021-03-06 | Discharge: 2021-03-06 | Disposition: A | Discharge status: Home / Self Care | Payer: 59 | Attending: Emergency Medicine | Admitting: Emergency Medicine

## 2021-03-06 DIAGNOSIS — Z992 Dependence on renal dialysis: Secondary | ICD-10-CM | POA: Diagnosis not present

## 2021-03-06 DIAGNOSIS — Z85038 Personal history of other malignant neoplasm of large intestine: Secondary | ICD-10-CM | POA: Insufficient documentation

## 2021-03-06 DIAGNOSIS — I132 Hypertensive heart and chronic kidney disease with heart failure and with stage 5 chronic kidney disease, or end stage renal disease: Secondary | ICD-10-CM | POA: Diagnosis not present

## 2021-03-06 DIAGNOSIS — E113593 Type 2 diabetes mellitus with proliferative diabetic retinopathy without macular edema, bilateral: Secondary | ICD-10-CM | POA: Insufficient documentation

## 2021-03-06 DIAGNOSIS — T7840XA Allergy, unspecified, initial encounter: Secondary | ICD-10-CM | POA: Diagnosis not present

## 2021-03-06 DIAGNOSIS — E1140 Type 2 diabetes mellitus with diabetic neuropathy, unspecified: Secondary | ICD-10-CM | POA: Insufficient documentation

## 2021-03-06 DIAGNOSIS — E1122 Type 2 diabetes mellitus with diabetic chronic kidney disease: Secondary | ICD-10-CM | POA: Insufficient documentation

## 2021-03-06 DIAGNOSIS — Z79899 Other long term (current) drug therapy: Secondary | ICD-10-CM | POA: Insufficient documentation

## 2021-03-06 DIAGNOSIS — I5042 Chronic combined systolic (congestive) and diastolic (congestive) heart failure: Secondary | ICD-10-CM | POA: Insufficient documentation

## 2021-03-06 DIAGNOSIS — N186 End stage renal disease: Secondary | ICD-10-CM | POA: Insufficient documentation

## 2021-03-06 DIAGNOSIS — R0600 Dyspnea, unspecified: Secondary | ICD-10-CM

## 2021-03-06 DIAGNOSIS — R0602 Shortness of breath: Secondary | ICD-10-CM | POA: Diagnosis present

## 2021-03-06 DIAGNOSIS — Z85528 Personal history of other malignant neoplasm of kidney: Secondary | ICD-10-CM | POA: Diagnosis not present

## 2021-03-06 LAB — CBC WITH DIFFERENTIAL/PLATELET
Abs Immature Granulocytes: 0.02 10*3/uL (ref 0.00–0.07)
Basophils Absolute: 0 10*3/uL (ref 0.0–0.1)
Basophils Relative: 1 %
Eosinophils Absolute: 0.4 10*3/uL (ref 0.0–0.5)
Eosinophils Relative: 6 %
HCT: 31.4 % — ABNORMAL LOW (ref 39.0–52.0)
Hemoglobin: 10.1 g/dL — ABNORMAL LOW (ref 13.0–17.0)
Immature Granulocytes: 0 %
Lymphocytes Relative: 12 %
Lymphs Abs: 0.7 10*3/uL (ref 0.7–4.0)
MCH: 30.3 pg (ref 26.0–34.0)
MCHC: 32.2 g/dL (ref 30.0–36.0)
MCV: 94.3 fL (ref 80.0–100.0)
Monocytes Absolute: 0.5 10*3/uL (ref 0.1–1.0)
Monocytes Relative: 8 %
Neutro Abs: 4.5 10*3/uL (ref 1.7–7.7)
Neutrophils Relative %: 73 %
Platelets: 149 10*3/uL — ABNORMAL LOW (ref 150–400)
RBC: 3.33 MIL/uL — ABNORMAL LOW (ref 4.22–5.81)
RDW: 13.4 % (ref 11.5–15.5)
WBC: 6.2 10*3/uL (ref 4.0–10.5)
nRBC: 0 % (ref 0.0–0.2)

## 2021-03-06 LAB — BASIC METABOLIC PANEL
Anion gap: 8 (ref 5–15)
BUN: 40 mg/dL — ABNORMAL HIGH (ref 8–23)
CO2: 27 mmol/L (ref 22–32)
Calcium: 8.5 mg/dL — ABNORMAL LOW (ref 8.9–10.3)
Chloride: 102 mmol/L (ref 98–111)
Creatinine, Ser: 8.75 mg/dL — ABNORMAL HIGH (ref 0.61–1.24)
GFR, Estimated: 6 mL/min — ABNORMAL LOW (ref >60)
Glucose, Bld: 110 mg/dL — ABNORMAL HIGH (ref 70–99)
Potassium: 4.3 mmol/L (ref 3.5–5.1)
Sodium: 137 mmol/L (ref 135–145)

## 2021-03-06 LAB — TROPONIN I (HIGH SENSITIVITY)
Troponin I (High Sensitivity): 51 ng/L — ABNORMAL HIGH (ref <18)
Troponin I (High Sensitivity): 48 ng/L — ABNORMAL HIGH

## 2021-03-06 NOTE — ED Provider Notes (Signed)
[facility] EMERGENCY DEPARTMENT Provider Note   CSN: [id number] Arrival date & time: 03/06/21  0320     History Chief Complaint  Patient presents with   Allergic Reaction    Charles Hart is a 65 y.o. male.  Patient with a history of ESRD on dialysis, nonischemic cardiomyopathy, diabetes, hypertension, sleep apnea on CPAP presenting with concern for "allergic reaction."  States he took multiple medications altogether including hydralazine, carvedilol, Lasix just before bed.  He normally takes these with a meal and not all at once.  After taking these medications he began to feel short of breath and had to sit up.  He had nausea and one episode of vomiting.  He now feels better.  States this happened before when he took all the medications at once.  These are his usual medications with no change in the doses.  Denies any chest pain.  States he is breathing normally now.  No abdominal pain or nausea or vomiting.  No focal weakness, numbness or tingling.  No visual changes. Due for dialysis this morning.  No missed sessions States his medications at the same as usual but he took them all at once later in the day than he usually does.  Similar symptoms have happened before.   Allergic Reaction Presenting symptoms: no rash       Past Medical History:  Diagnosis Date   Adenomatous colon polyp 07/02/2011   Last colonoscopy May 06, 2011 by Dr. Owens Loffler, who recommended repeat colonoscopy in 5 years.    Anemia    Background diabetic retinopathy 04/20/2012   Patient is followed by Dr. Katy Fitch    Cancer [facility])    Kidney   Cardiomyopathy    LV function improved from 2004 to 2008.  Historically, moderately dilated LV with EF 30-40% by 2D echo 08/14/2002.  Mild CAD with severe LV dysfunction by cardiac cath 09/2002.  Normal coronary arteries and normal LV function by cardiac cath 09/19/2006.  A 2-D echo on 04/01/2009 showed mild concentric hypertrophy and normal systolic (LVEF  70-01%)  and doppler C/W with grade 1 diastolic dysfunction.   CHF (congestive heart failure) (HCC)    LV function improved from 2004 to 2008.  Historically, moderately dilated LV with EF 30-40% by 2D echo 08/14/2002.  Mild CAD with severe LV dysfunction by cardiac cath 09/2002.  Normal coronary arteries and normal LV function by cardiac cath 09/19/2006.  A 2-D echo on 04/01/2009 showed mild concentric hypertrophy and normal systolic (LVEF  74-94%) and doppler C/W with grade 1 diastolic dysfunction..    Chronic combined systolic and diastolic congestive heart failure ([place]) 05/21/2010   LV function improved from 2004 to 2008.  Historically, moderately dilated LV with EF 30-40% by 2D echo 08/14/2002.  Mild CAD with severe LV dysfunction by cardiac cath 09/2002.  Normal coronary arteries and normal LV function by cardiac cath 09/19/2006.  A 2-D echo on 04/01/2009 showed mild concentric hypertrophy and normal systolic (LVEF  49-67%) and doppler parameters consistent with abnormal left    CVA (cerebral vascular accident) ([place]) 07/04/2012   MRI of the brain 07/04/2012 showed an acute infarct in the right basal ganglia involving the anterior putamen, anterior limb internal capsule, and head of the caudate; this measured approximately 2.5 cm in diameter.      Dermatitis    Diabetes mellitus    type 2   DIABETIC PERIPHERAL NEUROPATHY 08/03/2007   Qualifier: Diagnosis of  By: Marinda Elk MD, Sonia Side  DM neuropathy, painful (HCC)    fingers and right knee   ESRD (end stage renal disease) ([place])    Stage 4, on hemodailysis x 4 months as of 05-18-18 [facility], tues thurs sat   ESRD (end stage renal disease) on dialysis ([place])    "TTS; [facility]" (05/31/2018)   Hearing loss in right ear    Hyperlipidemia    Hypertension    Hypertensive crisis 07/28/2012   Hypertensive urgency 08/20/2014   Myocardial infarction [facility])  many yrs ago   Nephrotic syndrome 02/18/2013   A 24-hour urine collection 03/04/2013 showed total protein  of 5,460 g and creatinine clearance of 80 mL/minute.  Patient was seen by Seward Meth at [place] and a repeat 24-hour urine showed 10,407 mg protein.  Patient underwent kidney biopsy on 05/30/2013; pathology showed advanced diffuse and nodular diabetic nephropathy with vascular changes consistent with long-standing difficult to control hypertension.      Pneumonia 2014 and 2015   Renal insufficiency    Sleep apnea    11/06/20 - hasn't used it "in a while"    Patient Active Problem List   Diagnosis Date Noted   S/P hernia repair 11/09/2020   Ventral hernia without obstruction or gangrene 05/26/2020   Surgical pneumoperitoneum    Trochanteric bursitis of right hip 04/25/2018   Aortic atherosclerosis ([place]) 12/07/2017   ESRD on hemodialysis ([place]) 11/24/2017   S/P arteriovenous (AV) fistula creation 11/24/2017   Dyspnea 11/08/2017   History of renal cell carcinoma 09/06/2017   Piriformis syndrome of right side 03/03/2016   Muscle spasm of left shoulder area 03/03/2016   Secondary hyperparathyroidism, renal ([place]) 02/09/2016   Primary osteoarthritis of left knee 01/07/2016   Low back pain 01/07/2016   Joint stiffness 11/13/2015   Age-related nuclear cataract of both eyes 10/07/2015   Normocytic anemia 08/22/2014   Health care maintenance 05/16/2014   Nephrotic syndrome in diseases classified elsewhere 02/18/2013   Obstructive sleep apnea 07/26/2012   History of stroke 07/04/2012   Controlled type 2 diabetes mellitus with both eyes affected by proliferative retinopathy without macular edema, with long-term current use of insulin ([place]) 04/20/2012   ED (erectile dysfunction) 04/18/2012   Benign neoplasm of colon 07/02/2011   Chronic combined systolic and diastolic congestive heart failure ([place]) 05/21/2010   Carotid bruit 04/01/2009   Type 2 diabetes mellitus with diabetic neuropathy ([place]) 08/03/2007   Hearing loss 07/30/2007   Controlled type 2 diabetes  mellitus with chronic kidney disease ([place]) 09/15/2006   Other and unspecified hyperlipidemia 06/01/2006   Essential (primary) hypertension 06/01/2006    Past Surgical History:  Procedure Laterality Date   AV FISTULA PLACEMENT Left 11/24/2017   Procedure: INSERTION OF ARTERIOVENOUS (AV) GORE-TEX GRAFT LEFT LOWER ARM;  Surgeon: Rosetta Posner, MD;  Location: [place];  Service: Vascular;  Laterality: Left;   CARDIAC CATHETERIZATION      4 times   COLONOSCOPY     COLONOSCOPY WITH PROPOFOL N/A 06/18/2020   Procedure: COLONOSCOPY WITH PROPOFOL;  Surgeon: Milus Banister, MD;  Location: [facility];  Service: Endoscopy;  Laterality: N/A;   FOOT SURGERY     INCISIONAL HERNIA REPAIR N/A 11/09/2020   Procedure: OPEN INCISIONAL HERNIA REPAIR WITH MESH;  Surgeon: Ralene Ok, MD;  Location: [place];  Service: General;  Laterality: N/A;   INSERTION OF MESH N/A 11/09/2020   Procedure: INSERTION OF MESH;  Surgeon: Ralene Ok, MD;  Location: [place];  Service: General;  Laterality: N/A;  POLYPECTOMY     POLYPECTOMY  06/18/2020   Procedure: POLYPECTOMY;  Surgeon: Milus Banister, MD;  Location: [facility];  Service: Endoscopy;;   ROBOT ASSISTED LAPAROSCOPIC NEPHRECTOMY Left 05/30/2018   Procedure: XI ROBOTIC ASSISTED LAPAROSCOPIC NEPHRECTOMY;  Surgeon: Alexis Frock, MD;  Location: [facility];  Service: Urology;  Laterality: Left;       Family History  Problem Relation Age of Onset   Aneurysm Father 38       died of rupture   Colon cancer Sister     Social History   Tobacco Use   Smoking status: Never   Smokeless tobacco: Never  Vaping Use   Vaping Use: Never used  Substance Use Topics   Alcohol use: No    Alcohol/week: 0.0 standard drinks   Drug use: No    Home Medications Prior to Admission medications   Medication Sig Start Date End Date Taking? Authorizing Provider  acetaminophen (TYLENOL) 325 MG tablet Take 2 tablets (650 mg total) by mouth every 6 (six) hours as needed for  mild pain. 11/18/20   Norm Parcel, PA-C  acetaminophen (TYLENOL) 500 MG tablet Take 1,000 mg by mouth every 6 (six) hours as needed.    [provider]  calcium acetate (PHOSLO) 667 MG capsule Take 2,001 mg by mouth 3 (three) times daily with meals.  12/15/17   Tye Savoy, MD  carvedilol (COREG) 25 MG tablet Take 1 tablet (25 mg total) by mouth 2 (two) times daily with a meal. 09/27/18   Lucious Groves, DO  Cyanocobalamin (VITAMIN B-12 PO) Take 2,500 mcg by mouth daily. Patient not taking: Reported on 12/12/2020    [provider]  fluticasone (FLONASE) 50 MCG/ACT nasal spray Place 2 sprays into both nostrils as needed for allergies. 09/27/18   Lucious Groves, DO  folic acid-vitamin b complex-vitamin c-selenium-zinc (DIALYVITE) 3 MG TABS tablet Take 1 tablet by mouth daily.    [provider]  furosemide (LASIX) 80 MG tablet Take 1 tablet (80 mg total) by mouth 2 (two) times daily. 01/28/20   Lucious Groves, DO  gabapentin (NEURONTIN) 300 MG capsule Take 300 mg by mouth at bedtime.    [provider]  hydrALAZINE (APRESOLINE) 100 MG tablet TAKE 1 TABLET(100 MG) BY MOUTH THREE TIMES DAILY 12/03/20   Lucious Groves, DO  meclizine (ANTIVERT) 12.5 MG tablet Take 1 tablet (12.5 mg total) by mouth 3 (three) times daily as needed for dizziness. 12/12/20   Long, Wonda Olds, MD  pravastatin (PRAVACHOL) 40 MG tablet Take 1 tablet (40 mg total) by mouth daily. 01/28/20   Lucious Groves, DO  sildenafil (VIAGRA) 50 MG tablet Take 1 tablet (50 mg total) by mouth as needed for erectile dysfunction. Patient taking differently: Take 50 mg by mouth daily as needed for erectile dysfunction. 11/01/19 10/31/20  Lucious Groves, DO  SUPER B COMPLEX/C PO Take 1 tablet by mouth daily.    [provider]    Allergies    Ivp dye [iodinated diagnostic agents], Hydrocodone, Phentermine, Amlodipine, Lisinopril, and Red dye  Review of Systems   Review of Systems  Constitutional:   Negative for activity change, appetite change, fatigue and fever.  HENT:  Negative for congestion and rhinorrhea.   Eyes:  Negative for visual disturbance.  Respiratory:  Positive for shortness of breath. Negative for chest tightness.   Cardiovascular:  Negative for chest pain.  Gastrointestinal:  Positive for nausea and vomiting. Negative for abdominal pain.  Genitourinary:  Negative for dysuria and hematuria.  Musculoskeletal:  Negative for arthralgias and myalgias.  Skin:  Negative for rash.  Neurological:  Negative for dizziness, weakness and headaches.   all other systems are negative except as noted in the HPI and PMH.   Physical Exam Updated Vital Signs BP (!) 145/86   Pulse 90   Temp 98.2 F (36.8 C) (Oral)   Resp (!) 22   Ht 5\' 9"  (1.753 m)   Wt 93 kg   SpO2 96%   BMI 30.27 kg/m   Physical Exam Vitals and nursing note reviewed.  Constitutional:      General: He is not in acute distress.    Appearance: Normal appearance. He is well-developed and normal weight. He is not ill-appearing.  HENT:     Head: Normocephalic and atraumatic.     Mouth/Throat:     Pharynx: No oropharyngeal exudate.     Comments: No tongue or lip swelling. Eyes:     Conjunctiva/sclera: Conjunctivae normal.     Pupils: Pupils are equal, round, and reactive to light.  Neck:     Comments: No meningismus. Cardiovascular:     Rate and Rhythm: Normal rate and regular rhythm.     Heart sounds: Normal heart sounds. No murmur heard. Pulmonary:     Effort: Pulmonary effort is normal. No respiratory distress.     Breath sounds: Normal breath sounds.     Comments: Diminished breath sounds bilateral Abdominal:     Palpations: Abdomen is soft.     Tenderness: There is no abdominal tenderness. There is no guarding or rebound.  Musculoskeletal:        General: No tenderness. Normal range of motion.     Cervical back: Normal range of motion and neck supple.     Right lower leg: No edema.     Left  lower leg: No edema.     Comments: Fistula LUE with thrill  Skin:    General: Skin is warm.  Neurological:     Mental Status: He is alert and oriented to person, place, and time.     Cranial Nerves: No cranial nerve deficit.     Motor: No abnormal muscle tone.     Coordination: Coordination normal.     Comments:  5/5 strength throughout. CN 2-12 intact.Equal grip strength.   Psychiatric:        Behavior: Behavior normal.    ED Results / Procedures / Treatments   Labs (all labs ordered are listed, but only abnormal results are displayed) Labs Reviewed  CBC WITH DIFFERENTIAL/PLATELET - Abnormal; Notable for the following components:      Result Value   RBC 3.33 (*)    Hemoglobin 10.1 (*)    HCT 31.4 (*)    Platelets 149 (*)    All other components within normal limits  BASIC METABOLIC PANEL - Abnormal; Notable for the following components:   Glucose, Bld 110 (*)    BUN 40 (*)    Creatinine, Ser 8.75 (*)    Calcium 8.5 (*)    GFR, Estimated 6 (*)    All other components within normal limits  TROPONIN I (HIGH SENSITIVITY) - Abnormal; Notable for the following components:   Troponin I (High Sensitivity) 51 (*)    All other components within normal limits  TROPONIN I (HIGH SENSITIVITY) - Abnormal; Notable for the following components:   Troponin I (High Sensitivity) 48 (*)    All other components within normal limits  EKG EKG Interpretation  Date/Time:  Saturday March 06 2021 03:34:24 EDT Ventricular Rate:  89 PR Interval:  169 QRS Duration: 117 QT Interval:  420 QTC Calculation: 512 R Axis:   31 Text Interpretation: Sinus rhythm LVH with IVCD and secondary repol abnrm Anterior ST elevation, probably due to LVH Prolonged QT interval No significant change was found Confirmed by Ezequiel Essex [phone]) on 03/06/2021 3:52:43 AM  Radiology DG Chest Portable 1 View  Result Date: 03/06/2021 CLINICAL DATA:  Shortness of breath. EXAM: PORTABLE CHEST 1 VIEW COMPARISON:   11/12/2020 FINDINGS: 0426 hours. The cardio pericardial silhouette is enlarged. Vascular congestion noted with diffuse interstitial prominence raising the question of edema. No substantial pleural effusion. No focal consolidation or pneumothorax. Telemetry leads overlie the chest. IMPRESSION: Enlarged cardiopericardial silhouette with vascular congestion and possible interstitial edema. Electronically Signed   By: Misty Stanley M.D.   On: 03/06/2021 05:14    Procedures Procedures   Medications Ordered in ED Medications - No data to display  ED Course  I have reviewed the triage vital signs and the nursing notes.  Pertinent labs & imaging results that were available during my care of the patient were reviewed by me and considered in my medical decision making (see chart for details).    MDM Rules/Calculators/A&P                           Episode of shortness of breath with nausea and vomiting after taking multiple medications at once.  States this is happened before when he takes his medications at once.  Now feels improved.  No chest pain.  EKG is unchanged.  Chest x-ray shows mild vascular congestion.  He is due for dialysis later this morning.  Labs are reassuring.  Hemoglobin is stable.  No hyperkalemia.  Monitored in the ED without deterioration.  No difficulty breathing or difficulty swallowing.  No chest pain.  No tongue or lip swelling.  Doubt allergic reaction.  Patient states similar symptoms have occurred when he takes all his medications at once and without eating. He feels back to baseline now. Troponin remains flat.  He appears stable to be discharged to dialysis this morning. Final Clinical Impression(s) / ED Diagnoses Final diagnoses:  Dyspnea, unspecified type  ESRD (end stage renal disease) ([place])    Rx / DC Orders ED Discharge Orders     None        Dillyn Menna, Annie Main, MD 03/06/21 [phone]

## 2021-03-06 NOTE — Discharge Instructions (Addendum)
Go directly to dialysis this morning.  Your blood work is reassuring.  Take your medications with dinnertime as you are prescribed.  Return to the ED with difficulty breathing, chest pain or any other concerns.

## 2021-03-06 NOTE — ED Triage Notes (Signed)
Pt states he got off of work and went home and took his Hydralizine and then he states he felt SOB so he had to "sit up" and then he got nauseated. Pt states he feels fine now but wants to make sure his medicine "isn't too strong for him".

## 2021-03-15 ENCOUNTER — Encounter (HOSPITAL_COMMUNITY): Payer: Self-pay | Admitting: Emergency Medicine

## 2021-03-15 ENCOUNTER — Other Ambulatory Visit: Payer: Self-pay

## 2021-03-15 ENCOUNTER — Emergency Department (HOSPITAL_COMMUNITY): Payer: 59

## 2021-03-15 ENCOUNTER — Emergency Department (HOSPITAL_COMMUNITY)
Admission: EM | Admit: 2021-03-15 | Discharge: 2021-03-15 | Disposition: A | Discharge status: Home / Self Care | Payer: 59 | Attending: Emergency Medicine | Admitting: Emergency Medicine

## 2021-03-15 DIAGNOSIS — Z85528 Personal history of other malignant neoplasm of kidney: Secondary | ICD-10-CM | POA: Diagnosis not present

## 2021-03-15 DIAGNOSIS — I428 Other cardiomyopathies: Secondary | ICD-10-CM

## 2021-03-15 DIAGNOSIS — R0602 Shortness of breath: Secondary | ICD-10-CM | POA: Insufficient documentation

## 2021-03-15 DIAGNOSIS — Z992 Dependence on renal dialysis: Secondary | ICD-10-CM | POA: Diagnosis not present

## 2021-03-15 DIAGNOSIS — Z79899 Other long term (current) drug therapy: Secondary | ICD-10-CM | POA: Diagnosis not present

## 2021-03-15 DIAGNOSIS — N186 End stage renal disease: Secondary | ICD-10-CM | POA: Diagnosis not present

## 2021-03-15 DIAGNOSIS — I5042 Chronic combined systolic (congestive) and diastolic (congestive) heart failure: Secondary | ICD-10-CM | POA: Insufficient documentation

## 2021-03-15 DIAGNOSIS — Z20822 Contact with and (suspected) exposure to covid-19: Secondary | ICD-10-CM | POA: Diagnosis not present

## 2021-03-15 DIAGNOSIS — I132 Hypertensive heart and chronic kidney disease with heart failure and with stage 5 chronic kidney disease, or end stage renal disease: Secondary | ICD-10-CM | POA: Diagnosis not present

## 2021-03-15 DIAGNOSIS — E1122 Type 2 diabetes mellitus with diabetic chronic kidney disease: Secondary | ICD-10-CM | POA: Diagnosis not present

## 2021-03-15 LAB — COMPREHENSIVE METABOLIC PANEL
ALT: 12 U/L (ref 0–44)
AST: 13 U/L — ABNORMAL LOW (ref 15–41)
Albumin: 3.9 g/dL (ref 3.5–5.0)
Alkaline Phosphatase: 34 U/L — ABNORMAL LOW (ref 38–126)
Anion gap: 10 (ref 5–15)
BUN: 38 mg/dL — ABNORMAL HIGH (ref 8–23)
CO2: 29 mmol/L (ref 22–32)
Calcium: 9.2 mg/dL (ref 8.9–10.3)
Chloride: 100 mmol/L (ref 98–111)
Creatinine, Ser: 9.14 mg/dL — ABNORMAL HIGH (ref 0.61–1.24)
GFR, Estimated: 6 mL/min — ABNORMAL LOW (ref >60)
Glucose, Bld: 110 mg/dL — ABNORMAL HIGH (ref 70–99)
Potassium: 3.9 mmol/L (ref 3.5–5.1)
Sodium: 139 mmol/L (ref 135–145)
Total Bilirubin: 0.8 mg/dL (ref 0.3–1.2)
Total Protein: 6.9 g/dL (ref 6.5–8.1)

## 2021-03-15 LAB — RESP PANEL BY RT-PCR (FLU A&B, COVID) ARPGX2
Influenza A by PCR: NEGATIVE
Influenza B by PCR: NEGATIVE
SARS Coronavirus 2 by RT PCR: NEGATIVE

## 2021-03-15 LAB — CBC
HCT: 32.1 % — ABNORMAL LOW (ref 39.0–52.0)
Hemoglobin: 10.3 g/dL — ABNORMAL LOW (ref 13.0–17.0)
MCH: 30 pg (ref 26.0–34.0)
MCHC: 32.1 g/dL (ref 30.0–36.0)
MCV: 93.6 fL (ref 80.0–100.0)
Platelets: 202 10*3/uL (ref 150–400)
RBC: 3.43 MIL/uL — ABNORMAL LOW (ref 4.22–5.81)
RDW: 13.3 % (ref 11.5–15.5)
WBC: 4.7 10*3/uL (ref 4.0–10.5)
nRBC: 0 % (ref 0.0–0.2)

## 2021-03-15 LAB — TROPONIN I (HIGH SENSITIVITY): Troponin I (High Sensitivity): 60 ng/L — ABNORMAL HIGH (ref <18)

## 2021-03-15 LAB — BRAIN NATRIURETIC PEPTIDE: B Natriuretic Peptide: 3371 pg/mL — ABNORMAL HIGH (ref 0.0–100.0)

## 2021-03-15 MED ORDER — FUROSEMIDE 40 MG PO TABS
80.0000 mg | ORAL_TABLET | Freq: Once | ORAL | Status: AC
  Administered 2021-03-15: 80 mg via ORAL
  Filled 2021-03-15: qty 2

## 2021-03-15 MED ORDER — FUROSEMIDE 10 MG/ML IJ SOLN: 80.0000 mg | Freq: Once | INTRAMUSCULAR | Status: DC

## 2021-03-15 NOTE — ED Notes (Signed)
Got pt a urinal ?

## 2021-03-15 NOTE — Discharge Instructions (Addendum)
It was our pleasure to provide your ER care today - we hope that you feel better.  We discussed your case with your kidney doctors - he indicates for you to follow up at your next dialysis tomorrow -  he will leave message with your dialysis center to optimize the amount of fluid they take off during dialysis. Make sure to limit salt intake, and eat kidney/dialysis and heart healthy meal plan.   Follow up with primary care doctor in the coming week.  Return to ER if worse, new symptoms, fevers, chest pain, increased trouble breathing, or other concern.

## 2021-03-15 NOTE — ED Triage Notes (Signed)
Pt c/o SOB x 2 weeks; pt reports he was seen here for same last week

## 2021-03-15 NOTE — ED Provider Notes (Addendum)
[facility] EMERGENCY DEPARTMENT Provider Note   CSN: [id number] Arrival date & time: 03/15/21  0755     History Chief Complaint  Patient presents with   Shortness of Breath    Charles Hart is a 65 y.o. male.  Patient w hx ESRD/HD T/TH/S c/o sob in past two weeks. States mainly at night, when lies down to go to sleep, but occasionally other times w exertion. Symptoms acute onset, episodic, mild/moderate, recurrent, persistent. No cough or sore throat. No known ill contacts or known covid exposure. States compliant w normal meds and compliant w dialysis including his normal hd two days ago. Denies increased swelling. No pnd. No fever or chills. Denies any associated chest pain or discomfort. No leg pain or swelling. No fever or chills.   The history is provided by the patient.  Shortness of Breath Associated symptoms: no abdominal pain, no chest pain, no cough, no fever, no headaches, no neck pain, no rash, no sore throat and no vomiting       Past Medical History:  Diagnosis Date   Adenomatous colon polyp 07/02/2011   Last colonoscopy May 06, 2011 by Dr. Owens Loffler, who recommended repeat colonoscopy in 5 years.    Anemia    Background diabetic retinopathy 04/20/2012   Patient is followed by Dr. Katy Fitch    Cancer [facility])    Kidney   Cardiomyopathy    LV function improved from 2004 to 2008.  Historically, moderately dilated LV with EF 30-40% by 2D echo 08/14/2002.  Mild CAD with severe LV dysfunction by cardiac cath 09/2002.  Normal coronary arteries and normal LV function by cardiac cath 09/19/2006.  A 2-D echo on 04/01/2009 showed mild concentric hypertrophy and normal systolic (LVEF  14-48%) and doppler C/W with grade 1 diastolic dysfunction.   CHF (congestive heart failure) (HCC)    LV function improved from 2004 to 2008.  Historically, moderately dilated LV with EF 30-40% by 2D echo 08/14/2002.  Mild CAD with severe LV dysfunction by cardiac cath 09/2002.  Normal coronary  arteries and normal LV function by cardiac cath 09/19/2006.  A 2-D echo on 04/01/2009 showed mild concentric hypertrophy and normal systolic (LVEF  18-56%) and doppler C/W with grade 1 diastolic dysfunction..    Chronic combined systolic and diastolic congestive heart failure ([place]) 05/21/2010   LV function improved from 2004 to 2008.  Historically, moderately dilated LV with EF 30-40% by 2D echo 08/14/2002.  Mild CAD with severe LV dysfunction by cardiac cath 09/2002.  Normal coronary arteries and normal LV function by cardiac cath 09/19/2006.  A 2-D echo on 04/01/2009 showed mild concentric hypertrophy and normal systolic (LVEF  31-49%) and doppler parameters consistent with abnormal left    CVA (cerebral vascular accident) ([place]) 07/04/2012   MRI of the brain 07/04/2012 showed an acute infarct in the right basal ganglia involving the anterior putamen, anterior limb internal capsule, and head of the caudate; this measured approximately 2.5 cm in diameter.      Dermatitis    Diabetes mellitus    type 2   DIABETIC PERIPHERAL NEUROPATHY 08/03/2007   Qualifier: Diagnosis of  By: Marinda Elk MD, Sonia Side     DM neuropathy, painful ([place])    fingers and right knee   ESRD (end stage renal disease) ([place])    Stage 4, on hemodailysis x 4 months as of 05-18-18 [place] [facility], tues thurs sat   ESRD (end stage renal disease) on dialysis ([place])    "TTS; [facility]" (05/31/2018)  Hearing loss in right ear    Hyperlipidemia    Hypertension    Hypertensive crisis 07/28/2012   Hypertensive urgency 08/20/2014   Myocardial infarction [facility])  many yrs ago   Nephrotic syndrome 02/18/2013   A 24-hour urine collection 03/04/2013 showed total protein of 5,460 g and creatinine clearance of 80 mL/minute.  Patient was seen by Seward Meth at [facility] and a repeat 24-hour urine showed 10,407 mg protein.  Patient underwent kidney biopsy on 05/30/2013; pathology showed advanced diffuse and nodular diabetic  nephropathy with vascular changes consistent with long-standing difficult to control hypertension.      Pneumonia 2014 and 2015   Renal insufficiency    Sleep apnea    11/06/20 - hasn't used it "in a while"    Patient Active Problem List   Diagnosis Date Noted   S/P hernia repair 11/09/2020   Ventral hernia without obstruction or gangrene 05/26/2020   Surgical pneumoperitoneum    Trochanteric bursitis of right hip 04/25/2018   Aortic atherosclerosis ([place]) 12/07/2017   ESRD on hemodialysis ([place]) 11/24/2017   S/P arteriovenous (AV) fistula creation 11/24/2017   Dyspnea 11/08/2017   History of renal cell carcinoma 09/06/2017   Piriformis syndrome of right side 03/03/2016   Muscle spasm of left shoulder area 03/03/2016   Secondary hyperparathyroidism, renal ([place]) 02/09/2016   Primary osteoarthritis of left knee 01/07/2016   Low back pain 01/07/2016   Joint stiffness 11/13/2015   Age-related nuclear cataract of both eyes 10/07/2015   Normocytic anemia 08/22/2014   Health care maintenance 05/16/2014   Nephrotic syndrome in diseases classified elsewhere 02/18/2013   Obstructive sleep apnea 07/26/2012   History of stroke 07/04/2012   Controlled type 2 diabetes mellitus with both eyes affected by proliferative retinopathy without macular edema, with long-term current use of insulin ([place]) 04/20/2012   ED (erectile dysfunction) 04/18/2012   Benign neoplasm of colon 07/02/2011   Chronic combined systolic and diastolic congestive heart failure ([place]) 05/21/2010   Carotid bruit 04/01/2009   Type 2 diabetes mellitus with diabetic neuropathy ([place]) 08/03/2007   Hearing loss 07/30/2007   Controlled type 2 diabetes mellitus with chronic kidney disease ([place]) 09/15/2006   Other and unspecified hyperlipidemia 06/01/2006   Essential (primary) hypertension 06/01/2006    Past Surgical History:  Procedure Laterality Date   AV FISTULA PLACEMENT Left 11/24/2017   Procedure: INSERTION OF ARTERIOVENOUS  (AV) GORE-TEX GRAFT LEFT LOWER ARM;  Surgeon: Rosetta Posner, MD;  Location: [facility];  Service: Vascular;  Laterality: Left;   CARDIAC CATHETERIZATION      4 times   COLONOSCOPY     COLONOSCOPY WITH PROPOFOL N/A 06/18/2020   Procedure: COLONOSCOPY WITH PROPOFOL;  Surgeon: Milus Banister, MD;  Location: [facility];  Service: Endoscopy;  Laterality: N/A;   FOOT SURGERY     INCISIONAL HERNIA REPAIR N/A 11/09/2020   Procedure: OPEN INCISIONAL HERNIA REPAIR WITH MESH;  Surgeon: Ralene Ok, MD;  Location: [facility];  Service: General;  Laterality: N/A;   INSERTION OF MESH N/A 11/09/2020   Procedure: INSERTION OF MESH;  Surgeon: Ralene Ok, MD;  Location: [facility] OR;  Service: General;  Laterality: N/A;   POLYPECTOMY     POLYPECTOMY  06/18/2020   Procedure: POLYPECTOMY;  Surgeon: Milus Banister, MD;  Location: [facility] END[facility];  Service: Endoscopy;;   ROBOT ASSISTED LAPAROSCOPIC NEPHRECTOMY Left 05/30/2018   Procedure: XI ROBOTIC ASSISTED LAPAROSCOPIC NEPHRECTOMY;  Surgeon: Alexis Frock, MD;  Location: [facility]S;  Service: Urology;  Laterality: Left;       Family History  Problem Relation Age of Onset   Aneurysm Father 46       died of rupture   Colon cancer Sister     Social History   Tobacco Use   Smoking status: Never   Smokeless tobacco: Never  Vaping Use   Vaping Use: Never used  Substance Use Topics   Alcohol use: No    Alcohol/week: 0.0 standard drinks   Drug use: No    Home Medications Prior to Admission medications   Medication Sig Start Date End Date Taking? Authorizing Provider  acetaminophen (TYLENOL) 325 MG tablet Take 2 tablets (650 mg total) by mouth every 6 (six) hours as needed for mild pain. 11/18/20   Norm Parcel, PA-C  acetaminophen (TYLENOL) 500 MG tablet Take 1,000 mg by mouth every 6 (six) hours as needed.    [provider]  calcium acetate (PHOSLO) 667 MG capsule Take 2,001 mg by mouth 3 (three) times daily with meals.  12/15/17   Tye Savoy, MD  carvedilol (COREG) 25 MG tablet Take 1 tablet (25 mg total) by mouth 2 (two) times daily with a meal. 09/27/18   Lucious Groves, DO  Cyanocobalamin (VITAMIN B-12 PO) Take 2,500 mcg by mouth daily. Patient not taking: Reported on 12/12/2020    [provider]  fluticasone (FLONASE) 50 MCG/ACT nasal spray Place 2 sprays into both nostrils as needed for allergies. 09/27/18   Lucious Groves, DO  folic acid-vitamin b complex-vitamin c-selenium-zinc (DIALYVITE) 3 MG TABS tablet Take 1 tablet by mouth daily.    [provider]  furosemide (LASIX) 80 MG tablet Take 1 tablet (80 mg total) by mouth 2 (two) times daily. 01/28/20   Lucious Groves, DO  gabapentin (NEURONTIN) 300 MG capsule Take 300 mg by mouth at bedtime.    [provider]  hydrALAZINE (APRESOLINE) 100 MG tablet TAKE 1 TABLET(100 MG) BY MOUTH THREE TIMES DAILY 12/03/20   Lucious Groves, DO  meclizine (ANTIVERT) 12.5 MG tablet Take 1 tablet (12.5 mg total) by mouth 3 (three) times daily as needed for dizziness. 12/12/20   Long, Wonda Olds, MD  pravastatin (PRAVACHOL) 40 MG tablet Take 1 tablet (40 mg total) by mouth daily. 01/28/20   Lucious Groves, DO  sildenafil (VIAGRA) 50 MG tablet Take 1 tablet (50 mg total) by mouth as needed for erectile dysfunction. Patient taking differently: Take 50 mg by mouth daily as needed for erectile dysfunction. 11/01/19 10/31/20  Lucious Groves, DO  SUPER B COMPLEX/C PO Take 1 tablet by mouth daily.    [provider]    Allergies    Ivp dye [iodinated diagnostic agents], Hydrocodone, Phentermine, Amlodipine, Lisinopril, and Red dye  Review of Systems   Review of Systems  Constitutional:  Negative for fever.  HENT:  Negative for sore throat.   Eyes:  Negative for redness.  Respiratory:  Positive for shortness of breath. Negative for cough.   Cardiovascular:  Negative for chest pain, palpitations and leg swelling.  Gastrointestinal:  Negative for abdominal  pain, diarrhea and vomiting.  Genitourinary:  Negative for dysuria and flank pain.  Musculoskeletal:  Negative for back pain and neck pain.  Skin:  Negative for rash.  Neurological:  Negative for headaches.  Hematological:  Does not bruise/bleed easily.  Psychiatric/Behavioral:  Negative for confusion.    Physical Exam Updated Vital Signs BP (!) 161/93 (BP Location: Right Arm)   Pulse  98   Temp 98.8 F (37.1 C) (Oral)   Resp 20   Ht 1.753 m (5\' 9" )   Wt 90.7 kg   SpO2 94%   BMI 29.53 kg/m   Physical Exam Vitals and nursing note reviewed.  Constitutional:      Appearance: Normal appearance. He is well-developed.  HENT:     Head: Atraumatic.     Nose: Nose normal.     Mouth/Throat:     Mouth: Mucous membranes are moist.     Pharynx: Oropharynx is clear.  Eyes:     General: No scleral icterus.    Conjunctiva/sclera: Conjunctivae normal.  Neck:     Trachea: No tracheal deviation.  Cardiovascular:     Rate and Rhythm: Normal rate and regular rhythm.     Pulses: Normal pulses.     Heart sounds: Normal heart sounds. No murmur heard.   No friction rub. No gallop.  Pulmonary:     Effort: Pulmonary effort is normal. No accessory muscle usage or respiratory distress.     Breath sounds: Normal breath sounds.  Abdominal:     General: Bowel sounds are normal. There is no distension.     Palpations: Abdomen is soft.     Tenderness: There is no abdominal tenderness.  Genitourinary:    Comments: No cva tenderness. Musculoskeletal:        General: No swelling or tenderness.     Cervical back: Normal range of motion and neck supple. No rigidity.     Right lower leg: No edema.     Left lower leg: No edema.     Comments: LUE HD graft w palp thrill  Skin:    General: Skin is warm and dry.     Findings: No rash.  Neurological:     Mental Status: He is alert.     Comments: Alert, speech clear.   Psychiatric:        Mood and Affect: Mood normal.    ED Results / Procedures /  Treatments   Labs (all labs ordered are listed, but only abnormal results are displayed) Results for orders placed or performed during the hospital encounter of 03/15/21  CBC  Result Value Ref Range   WBC 4.7 4.0 - 10.5 K/uL   RBC 3.43 (L) 4.22 - 5.81 MIL/uL   Hemoglobin 10.3 (L) 13.0 - 17.0 g/dL   HCT 32.1 (L) 39.0 - 52.0 %   MCV 93.6 80.0 - 100.0 fL   MCH 30.0 26.0 - 34.0 pg   MCHC 32.1 30.0 - 36.0 g/dL   RDW 13.3 11.5 - 15.5 %   Platelets 202 150 - 400 K/uL   nRBC 0.0 0.0 - 0.2 %  Comprehensive metabolic panel  Result Value Ref Range   Sodium 139 135 - 145 mmol/L   Potassium 3.9 3.5 - 5.1 mmol/L   Chloride 100 98 - 111 mmol/L   CO2 29 22 - 32 mmol/L   Glucose, Bld 110 (H) 70 - 99 mg/dL   BUN 38 (H) 8 - 23 mg/dL   Creatinine, Ser 9.14 (H) 0.61 - 1.24 mg/dL   Calcium 9.2 8.9 - 10.3 mg/dL   Total Protein 6.9 6.5 - 8.1 g/dL   Albumin 3.9 3.5 - 5.0 g/dL   AST 13 (L) 15 - 41 U/L   ALT 12 0 - 44 U/L   Alkaline Phosphatase 34 (L) 38 - 126 U/L   Total Bilirubin 0.8 0.3 - 1.2 mg/dL   GFR, Estimated 6 (L) >  60 mL/min   Anion gap 10 5 - 15  Brain natriuretic peptide  Result Value Ref Range   B Natriuretic Peptide 3,371.0 (H) 0.0 - 100.0 pg/mL  Troponin I (High Sensitivity)  Result Value Ref Range   Troponin I (High Sensitivity) 60 (H) <18 ng/L   DG Chest Portable 1 View  Result Date: 03/06/2021 CLINICAL DATA:  Shortness of breath. EXAM: PORTABLE CHEST 1 VIEW COMPARISON:  11/12/2020 FINDINGS: 0426 hours. The cardio pericardial silhouette is enlarged. Vascular congestion noted with diffuse interstitial prominence raising the question of edema. No substantial pleural effusion. No focal consolidation or pneumothorax. Telemetry leads overlie the chest. IMPRESSION: Enlarged cardiopericardial silhouette with vascular congestion and possible interstitial edema. Electronically Signed   By: Misty Stanley M.D.   On: 03/06/2021 05:14    EKG EKG Interpretation  Date/Time:  Monday March 15 2021 08:23:11 EDT Ventricular Rate:  98 PR Interval:  163 QRS Duration: 124 QT Interval:  395 QTC Calculation: 505 R Axis:   21 Text Interpretation: Sinus rhythm IVCD, consider atypical LBBB Left ventricular hypertrophy Non-specific ST-t changes Confirmed by Lajean Saver [phone]) on 03/15/2021 8:31:03 AM  Radiology DG Chest Port 1 View  Result Date: 03/15/2021 CLINICAL DATA:  Shortness of breath for 2 weeks. EXAM: PORTABLE CHEST 1 VIEW COMPARISON:  03/06/2021 and CT chest 11/08/2017. FINDINGS: Patient is slightly rotated. Trachea is midline. Heart is enlarged, similar. Mild interstitial prominence and indistinctness. No definite pleural fluid. IMPRESSION: Mild pulmonary edema. Electronically Signed   By: Lorin Picket M.D.   On: 03/15/2021 09:28    Procedures Procedures   Medications Ordered in ED Medications - No data to display  ED Course  I have reviewed the triage vital signs and the nursing notes.  Pertinent labs & imaging results that were available during my care of the patient were reviewed by me and considered in my medical decision making (see chart for details).    MDM Rules/Calculators/A&P                          Iv ns. Stat labs and imaging.   Reviewed nursing notes and prior charts for additional history.   Labs reviewed/interpreted by me - wbc normal.CKD/ESRD.   CXR reviewed/interpreted by me - vascular congestion/mild  edema.   Nephrology consulted - discussed pt with Dr Marval Regal - including symptoms, cxr, k, etc. - he indicates pt known to him, he indicates patient will need to go his next scheduled dialysis, he will leave word with team to optimize fluid taken off at [facility], f/u.   Recheck pt - pt is breathing comfortably, pulse ox 96% room air. K is normal. No chest pain or discomfort.   Will give pt an extra dose his lasix while in ED.   Pt continues to breath comfortably, no distress. No chest pain.   Pt currently appears stable for d/c.       Final Clinical Impression(s) / ED Diagnoses Final diagnoses:  None    Rx / DC Orders ED Discharge Orders     None            Lajean Saver, MD 03/15/21 1330

## 2021-03-26 ENCOUNTER — Encounter: Payer: Self-pay | Admitting: Internal Medicine

## 2021-03-26 ENCOUNTER — Ambulatory Visit (INDEPENDENT_AMBULATORY_CARE_PROVIDER_SITE_OTHER): Payer: 59 | Admitting: Internal Medicine

## 2021-03-26 ENCOUNTER — Other Ambulatory Visit: Payer: Self-pay

## 2021-03-26 DIAGNOSIS — R0600 Dyspnea, unspecified: Secondary | ICD-10-CM | POA: Diagnosis not present

## 2021-03-26 DIAGNOSIS — R058 Other specified cough: Secondary | ICD-10-CM | POA: Insufficient documentation

## 2021-03-26 DIAGNOSIS — R0609 Other forms of dyspnea: Secondary | ICD-10-CM

## 2021-03-26 MED ORDER — PANTOPRAZOLE SODIUM 40 MG PO TBEC: 40.0000 mg | DELAYED_RELEASE_TABLET | Freq: Every day | ORAL | 22 refills | Status: DC

## 2021-03-26 MED ORDER — FAMOTIDINE 20 MG PO TABS: ORAL_TABLET | ORAL | 11 refills | Status: DC

## 2021-03-26 NOTE — Assessment & Plan Note (Signed)
Onset July 2022  - pt not sure whether better or not berfore or after HD as of 03/26/2021  - 03/26/2021 rec max rx for gerd, observe whether reported noct sob correlates with HD - 03/26/2021   Walked on RA x  3  lap(s) =  approx 150 @ moderate pace, stopped due to end of study with  min sob  with lowest 02 sats 97  cxr suggests mild pulmonary edema but not clear if before or after HD so asked pt to observe and f/u for cough as above  Each maintenance medication was reviewed in detail including emphasizing most importantly the difference between maintenance and prns and under what circumstances the prns are to be triggered using an action plan format where appropriate.  Total time for H and P, chart review, counseling,  directly observing portions of ambulatory 02 saturation study/ and generating customized AVS unique to this office visit / same day charting = 50 min

## 2021-03-26 NOTE — Progress Notes (Signed)
Charles Hart, male    DOB: [birth year], 65 y.o.   MRN: [id number]    Brief patient profile:  38 yobm never smoker   referred to pulmonary clinic in [place]  03/26/2021 by Dr    Marval Hart for cough abrupt onset early July 2022 assoc with sob noct but not with adls    History of Present Illness  03/26/2021  Pulmonary/ 1st office eval/ Charles Hart / [facility] Office  Chief Complaint  Patient presents with   Pulmonary Consult    Referred by Charles Hart. Pt c/o non prod cough with SOB x 1 month.    Dyspnea:  MMRC1 = can walk nl pace, flat grade, can't hurry or go uphills or steps s sob   Cough: dry less when asleep  Sleep: sob worse  when lie down - can't tell worse on nights when not HD SABA use: none  Using lots of mints/ menthols due to sense of pnds    No obvious day to day or daytime variability or assoc excess/ purulent sputum or mucus plugs or hemoptysis or cp or chest tightness, subjective wheeze or overt sinus or hb symptoms.     Also denies any obvious fluctuation of symptoms with weather or environmental changes or other aggravating or alleviating factors except as outlined above   No unusual exposure hx or h/o childhood pna/ asthma or knowledge of premature birth.  Current Allergies, Complete Past Medical History, Past Surgical History, Family History, and Social History were reviewed in [employer] record.  ROS  The following are not active complaints unless bolded Hoarseness, sore throat, dysphagia, dental problems, itching, sneezing,  nasal congestion or discharge of excess mucus or purulent secretions, ear ache,   fever, chills, sweats, unintended wt loss or wt gain, classically pleuritic or exertional cp,  orthopnea pnd or arm/hand swelling  or  leg swelling (never has it even before HD), presyncope, palpitations, abdominal pain, anorexia, nausea, vomiting, diarrhea  or change in bowel habits or change in bladder habits, change in stools or  change in urine, dysuria, hematuria,  rash, arthralgias, visual complaints, headache, numbness, weakness or ataxia or problems with walking or coordination,  change in mood or  memory.             Past Medical History:  Diagnosis Date   Adenomatous colon polyp 07/02/2011   Last colonoscopy May 06, 2011 by Charles Hart, who recommended repeat colonoscopy in 5 years.    Anemia    Background diabetic retinopathy 04/20/2012   Patient is followed by Charles Hart    Cancer [facility])    Kidney   Cardiomyopathy    LV function improved from 2004 to 2008.  Historically, moderately dilated LV with EF 30-40% by 2D echo 08/14/2002.  Mild CAD with severe LV dysfunction by cardiac cath 09/2002.  Normal coronary arteries and normal LV function by cardiac cath 09/19/2006.  A 2-D echo on 04/01/2009 showed mild concentric hypertrophy and normal systolic (LVEF  33-29%) and doppler C/W with grade 1 diastolic dysfunction.   CHF (congestive heart failure) (HCC)    LV function improved from 2004 to 2008.  Historically, moderately dilated LV with EF 30-40% by 2D echo 08/14/2002.  Mild CAD with severe LV dysfunction by cardiac cath 09/2002.  Normal coronary arteries and normal LV function by cardiac cath 09/19/2006.  A 2-D echo on 04/01/2009 showed mild concentric hypertrophy and normal systolic (LVEF  51-88%) and doppler C/W with grade 1 diastolic dysfunction.[name]  Chronic combined systolic and diastolic congestive heart failure ([place]) 05/21/2010   LV function improved from 2004 to 2008.  Historically, moderately dilated LV with EF 30-40% by 2D echo 08/14/2002.  Mild CAD with severe LV dysfunction by cardiac cath 09/2002.  Normal coronary arteries and normal LV function by cardiac cath 09/19/2006.  A 2-D echo on 04/01/2009 showed mild concentric hypertrophy and normal systolic (LVEF  43-15%) and doppler parameters consistent with abnormal left    CVA (cerebral vascular accident) ([place]) 07/04/2012   MRI of the brain 07/04/2012  showed an acute infarct in the right basal ganglia involving the anterior putamen, anterior limb internal capsule, and head of the caudate; this measured approximately 2.5 cm in diameter.      Dermatitis    Diabetes mellitus    type 2   DIABETIC PERIPHERAL NEUROPATHY 08/03/2007   Qualifier: Diagnosis of  By: Charles Hart, Charles Hart     DM neuropathy, painful ([place])    fingers and right knee   ESRD (end stage renal disease) ([place])    Stage 4, on hemodailysis x 4 months as of 05-18-18 [facility], tues thurs sat   ESRD (end stage renal disease) on dialysis ([place])    "TTS; [facility]" (05/31/2018)   Hearing loss in right ear    Hyperlipidemia    Hypertension    Hypertensive crisis 07/28/2012   Hypertensive urgency 08/20/2014   Myocardial infarction [facility])  many yrs ago   Nephrotic syndrome 02/18/2013   A 24-hour urine collection 03/04/2013 showed total protein of 5,460 g and creatinine clearance of 80 mL/minute.  Patient was seen by Charles Hart at [place] and a repeat 24-hour urine showed 10,407 mg protein.  Patient underwent kidney biopsy on 05/30/2013; pathology showed advanced diffuse and nodular diabetic nephropathy with vascular changes consistent with long-standing difficult to control hypertension.      Pneumonia 2014 and 2015   Renal insufficiency    Sleep apnea    11/06/20 - hasn't used it "in a while"    Outpatient Medications Prior to Visit  Medication Sig Dispense Refill   acetaminophen (TYLENOL) 325 MG tablet Take 2 tablets (650 mg total) by mouth every 6 (six) hours as needed for mild pain.     acetaminophen (TYLENOL) 500 MG tablet Take 1,000 mg by mouth every 6 (six) hours as needed.     calcium acetate (PHOSLO) 667 MG capsule Take 2,001 mg by mouth 3 (three) times daily with meals.      carvedilol (COREG) 25 MG tablet Take 1 tablet (25 mg total) by mouth 2 (two) times daily with a meal. 180 tablet 1   Cyanocobalamin (VITAMIN B-12 PO) Take 2,500  mcg by mouth daily.     fluticasone (FLONASE) 50 MCG/ACT nasal spray Place 2 sprays into both nostrils as needed for allergies. 16 g 1   folic acid-vitamin b complex-vitamin c-selenium-zinc (DIALYVITE) 3 MG TABS tablet Take 1 tablet by mouth daily.     furosemide (LASIX) 80 MG tablet Take 1 tablet (80 mg total) by mouth 2 (two) times daily. 180 tablet 1   gabapentin (NEURONTIN) 300 MG capsule Take 300 mg by mouth at bedtime.     hydrALAZINE (APRESOLINE) 100 MG tablet TAKE 1 TABLET(100 MG) BY MOUTH THREE TIMES DAILY 270 tablet 0   meclizine (ANTIVERT) 12.5 MG tablet Take 1 tablet (12.5 mg total) by mouth 3 (three) times daily as needed for dizziness. 30 tablet 0   pravastatin (PRAVACHOL) 40 MG tablet Take  1 tablet (40 mg total) by mouth daily. 90 tablet 1   SUPER B COMPLEX/C PO Take 1 tablet by mouth daily.     sildenafil (VIAGRA) 50 MG tablet Take 1 tablet (50 mg total) by mouth as needed for erectile dysfunction. (Patient taking differently: Take 50 mg by mouth daily as needed for erectile dysfunction.) 10 tablet 2   No facility-administered medications prior to visit.     Objective:     BP 132/72 (BP Location: Left Arm, Cuff Size: Normal)   Pulse 85   Temp 98.1 F (36.7 C) (Oral)   Ht 5\' 9"  (1.753 m)   Wt 203 lb (92.1 kg)   SpO2 98% Comment: on RA  BMI 29.98 kg/m   SpO2: 98 % (on RA)  Amb bm unusual affect    HEENT : pt wearing mask not removed for exam due to covid -19 concerns.    NECK :  without JVD/Nodes/TM/ nl carotid upstrokes bilaterally   LUNGS: no acc muscle use,  Nl contour chest which is clear to A and P bilaterally without cough on insp or exp maneuvers   CV:  RRR  no s3 or murmur or increase in P2, and no edema   ABD: mod obese/ soft and nontender with nl inspiratory excursion in the supine position. No bruits or organomegaly appreciated, bowel sounds nl  MS:  Nl gait/ ext warm without deformities, calf tenderness, cyanosis or clubbing No obvious joint  restrictions   SKIN: warm and dry without lesions    NEURO:  alert, approp, nl sensorium with  no motor or cerebellar deficits apparent.      I personally reviewed images and agree with radiology impression as follows:  CXR:   03/15/21 Mild pulmonary edema      Assessment   No problem-specific Assessment & Plan notes found for this encounter.     Christinia Gully, Hart 03/26/2021

## 2021-03-26 NOTE — Assessment & Plan Note (Addendum)
Onset July 2022 rx with mint/menthol - 03/26/2021 rec no mint/menthol and rx with max gerd rx x 6 weeks then ov   Upper airway cough syndrome (previously labeled PNDS),  is so named because it's frequently impossible to sort out how much is  CR/sinusitis with freq throat clearing (which can be related to primary GERD)   vs  causing  secondary (" extra esophageal")  GERD from wide swings in gastric pressure that occur with throat clearing, often  promoting self use of mint and menthol lozenges that reduce the lower esophageal sphincter tone and exacerbate the problem further in a cyclical fashion.   These are the same pts (now being labeled as having "irritable larynx syndrome" by some cough centers) who not infrequently have a history of having failed to tolerate ace inhibitors,  dry powder inhalers or biphosphonates or report having atypical/extraesophageal reflux symptoms that don't respond to standard doses of PPI  and are easily confused as having aecopd or asthma flares by even experienced allergists/ pulmonologists (myself included).   Of the three most common causes of  Sub-acute / recurrent or chronic cough, only one (GERD)  can actually contribute to/ trigger  the other two (asthma and post nasal drip syndrome)  and perpetuate the cylce of cough.  While not intuitively obvious, many patients with chronic low grade reflux do not cough until there is a primary insult that disturbs the protective epithelial barrier and exposes sensitive nerve endings.   This is typically viral but can due to PNDS and  either may apply here.   >>>  The point is that once this occurs, it is difficult to eliminate the cycle  using anything but a maximally effective acid suppression regimen at least in the short run, accompanied by an appropriate diet to address non acid GERD before embarking on additonal w/u.

## 2021-03-26 NOTE — Patient Instructions (Signed)
Pantoprazole (protonix) 40 mg   Take  30-60 min before first meal of the day and Pepcid (famotidine)  20 mg after supper until return to office - this is the best way to tell whether stomach acid is contributing to your problem.    GERD (REFLUX)  is an extremely common cause of respiratory symptoms just like yours , many times with no obvious heartburn at all.    It can be treated with medication, but also with lifestyle changes including elevation of the head of your bed (ideally with 6 -8inch blocks under the headboard of your bed),  Smoking cessation, avoidance of late meals, excessive alcohol, and avoid fatty foods, chocolate, peppermint, colas, red wine, and acidic juices such as orange juice.  NO MINT OR MENTHOL PRODUCTS SO NO COUGH DROPS  USE SUGARLESS CANDY INSTEAD (Jolley ranchers or Stover's or Life Savers) or even ice chips will also do - the key is to swallow to prevent all throat clearing. NO OIL BASED VITAMINS - use powdered substitutes.  Avoid fish oil when coughing.   Pay attention to how you do with your breathing when you lie down on Sat night  vs Sun vs Monday   Please schedule a follow up office visit in 8 weeks, call sooner if needed with all medications /inhalers/ solutions in hand so we can verify exactly what you are taking. This includes all medications from all doctors and over the counters

## 2021-04-23 ENCOUNTER — Institutional Professional Consult (permissible substitution): Payer: 59 | Admitting: Student

## 2021-04-26 ENCOUNTER — Encounter: Payer: Self-pay | Admitting: *Deleted

## 2021-04-26 LAB — HEMOGLOBIN A1C: Hemoglobin-A1c: 5.1

## 2021-05-06 ENCOUNTER — Ambulatory Visit (INDEPENDENT_AMBULATORY_CARE_PROVIDER_SITE_OTHER): Payer: 59 | Admitting: Internal Medicine

## 2021-05-06 ENCOUNTER — Other Ambulatory Visit: Payer: Self-pay

## 2021-05-06 ENCOUNTER — Encounter: Payer: Self-pay | Admitting: Internal Medicine

## 2021-05-06 VITALS — BP 140/78 | HR 94 | Temp 98.0°F | Ht 72.0 in | Wt 205.9 lb

## 2021-05-06 DIAGNOSIS — G4733 Obstructive sleep apnea (adult) (pediatric): Secondary | ICD-10-CM | POA: Diagnosis not present

## 2021-05-06 DIAGNOSIS — E1122 Type 2 diabetes mellitus with diabetic chronic kidney disease: Secondary | ICD-10-CM

## 2021-05-06 DIAGNOSIS — Z992 Dependence on renal dialysis: Secondary | ICD-10-CM

## 2021-05-06 DIAGNOSIS — N186 End stage renal disease: Secondary | ICD-10-CM | POA: Diagnosis not present

## 2021-05-06 DIAGNOSIS — R058 Other specified cough: Secondary | ICD-10-CM | POA: Diagnosis not present

## 2021-05-06 DIAGNOSIS — I10 Essential (primary) hypertension: Secondary | ICD-10-CM | POA: Diagnosis not present

## 2021-05-07 NOTE — Assessment & Plan Note (Signed)
HPI: He notes some increased fatigue.  He attributes this to some poor sleep.  He notes that he has unable to tolerate his BiPAP at the settings he used to be able to.  He does sleep with multiple pillows propped behind him this has been chronic and unchanged.  He reports his dialysis is going well and that he is able to get to his goal dry weight at each session.  Assessment severe obstructive sleep apnea  Plan Given his sleep study was almost 9 years ago and he has had a number of changes in his medical condition since then I will try to repeat a split-night sleep study to reassess his obstructive sleep apnea and assess optimal CPAP pressures.

## 2021-05-07 NOTE — Assessment & Plan Note (Signed)
He is pleased that he has been accepted on the transplant list at [facility].  Although he notes this gives him some anticipation anxiety.

## 2021-05-07 NOTE — Progress Notes (Signed)
  Subjective:  HPI: [name] is a 65 y.o. male who presents for f/u HTN. ESRD, OSA  Please see Assessment and Plan below for the status of his chronic medical problems.  Objective:  Physical Exam: Vitals:   05/06/21 0959  BP: 140/78  Pulse: 94  Temp: 98 F (36.7 C)  TempSrc: Oral  SpO2: 100%  Weight: 205 lb 14.4 oz (93.4 kg)  Height: 6' (1.829 m)   Body mass index is 27.93 kg/m. Physical Exam Vitals and nursing note reviewed.  Cardiovascular:     Rate and Rhythm: Normal rate and regular rhythm.     Pulses: Normal pulses.  Pulmonary:     Effort: Pulmonary effort is normal.     Breath sounds: Normal breath sounds.  Musculoskeletal:     Right lower leg: No edema.     Left lower leg: No edema.   Assessment & Plan:  See Encounters Tab for problem based charting.  Medications Ordered No orders of the defined types were placed in this encounter.  Other Orders Orders Placed This Encounter  Procedures   Split night study    History of Severe Obstructive sleep apnea, now having trouble using his CPAP at previous settings.    Standing Status:   Future    Standing Expiration Date:   05/06/2022    Order Specific Question:   Where should this test be performed:    Answer:   [place]   Follow Up: Return in about 3 months (around 08/05/2021).

## 2021-05-07 NOTE — Assessment & Plan Note (Signed)
He notes since his visit with Dr. Melvyn Novas, he initially noticed no difference in the cough however after about 4 weeks he started to have some improvement.  He does not feel like his cough is fully resolved but has definitely noticed a difference.

## 2021-05-07 NOTE — Assessment & Plan Note (Addendum)
Denies any issues with his medications he reports compliance with medications, BP reasonably wwell controlled today

## 2021-05-07 NOTE — Assessment & Plan Note (Signed)
A1c checked at dialysis and at goal.  He does not currently require any diabetic medication.

## 2021-05-12 ENCOUNTER — Ambulatory Visit: Payer: 59 | Admitting: Cardiovascular Disease

## 2021-05-17 ENCOUNTER — Inpatient Hospital Stay (HOSPITAL_COMMUNITY)
Admission: EM | Admit: 2021-05-17 | Discharge: 2021-05-20 | DRG: 291 | Disposition: A | Discharge status: Home / Self Care | Payer: 59 | Attending: Internal Medicine | Admitting: Internal Medicine

## 2021-05-17 ENCOUNTER — Encounter (HOSPITAL_COMMUNITY): Payer: Self-pay | Admitting: Emergency Medicine

## 2021-05-17 ENCOUNTER — Emergency Department (HOSPITAL_COMMUNITY): Payer: 59

## 2021-05-17 DIAGNOSIS — I132 Hypertensive heart and chronic kidney disease with heart failure and with stage 5 chronic kidney disease, or end stage renal disease: Principal | ICD-10-CM | POA: Diagnosis present

## 2021-05-17 DIAGNOSIS — N186 End stage renal disease: Secondary | ICD-10-CM | POA: Diagnosis present

## 2021-05-17 DIAGNOSIS — Z7682 Awaiting organ transplant status: Secondary | ICD-10-CM

## 2021-05-17 DIAGNOSIS — Z800 Family history of malignant neoplasm of digestive organs: Secondary | ICD-10-CM

## 2021-05-17 DIAGNOSIS — R252 Cramp and spasm: Secondary | ICD-10-CM | POA: Diagnosis present

## 2021-05-17 DIAGNOSIS — Z91041 Radiographic dye allergy status: Secondary | ICD-10-CM

## 2021-05-17 DIAGNOSIS — Z8673 Personal history of transient ischemic attack (TIA), and cerebral infarction without residual deficits: Secondary | ICD-10-CM

## 2021-05-17 DIAGNOSIS — E785 Hyperlipidemia, unspecified: Secondary | ICD-10-CM | POA: Diagnosis present

## 2021-05-17 DIAGNOSIS — E11319 Type 2 diabetes mellitus with unspecified diabetic retinopathy without macular edema: Secondary | ICD-10-CM | POA: Diagnosis present

## 2021-05-17 DIAGNOSIS — R0602 Shortness of breath: Secondary | ICD-10-CM | POA: Diagnosis not present

## 2021-05-17 DIAGNOSIS — E1122 Type 2 diabetes mellitus with diabetic chronic kidney disease: Secondary | ICD-10-CM | POA: Diagnosis present

## 2021-05-17 DIAGNOSIS — E1142 Type 2 diabetes mellitus with diabetic polyneuropathy: Secondary | ICD-10-CM | POA: Diagnosis present

## 2021-05-17 DIAGNOSIS — D631 Anemia in chronic kidney disease: Secondary | ICD-10-CM | POA: Diagnosis present

## 2021-05-17 DIAGNOSIS — Z20822 Contact with and (suspected) exposure to covid-19: Secondary | ICD-10-CM | POA: Diagnosis present

## 2021-05-17 DIAGNOSIS — J810 Acute pulmonary edema: Secondary | ICD-10-CM

## 2021-05-17 DIAGNOSIS — E876 Hypokalemia: Secondary | ICD-10-CM | POA: Diagnosis present

## 2021-05-17 DIAGNOSIS — I5042 Chronic combined systolic (congestive) and diastolic (congestive) heart failure: Secondary | ICD-10-CM | POA: Diagnosis present

## 2021-05-17 DIAGNOSIS — N2581 Secondary hyperparathyroidism of renal origin: Secondary | ICD-10-CM | POA: Diagnosis present

## 2021-05-17 DIAGNOSIS — F419 Anxiety disorder, unspecified: Secondary | ICD-10-CM | POA: Diagnosis present

## 2021-05-17 DIAGNOSIS — I509 Heart failure, unspecified: Secondary | ICD-10-CM

## 2021-05-17 DIAGNOSIS — R000 Tachycardia, unspecified: Secondary | ICD-10-CM | POA: Diagnosis present

## 2021-05-17 DIAGNOSIS — H9191 Unspecified hearing loss, right ear: Secondary | ICD-10-CM | POA: Diagnosis present

## 2021-05-17 DIAGNOSIS — Z992 Dependence on renal dialysis: Secondary | ICD-10-CM

## 2021-05-17 DIAGNOSIS — Z885 Allergy status to narcotic agent status: Secondary | ICD-10-CM

## 2021-05-17 DIAGNOSIS — I429 Cardiomyopathy, unspecified: Secondary | ICD-10-CM | POA: Diagnosis present

## 2021-05-17 DIAGNOSIS — Z905 Acquired absence of kidney: Secondary | ICD-10-CM

## 2021-05-17 DIAGNOSIS — I252 Old myocardial infarction: Secondary | ICD-10-CM

## 2021-05-17 DIAGNOSIS — Z888 Allergy status to other drugs, medicaments and biological substances status: Secondary | ICD-10-CM

## 2021-05-17 DIAGNOSIS — Z87441 Personal history of nephrotic syndrome: Secondary | ICD-10-CM

## 2021-05-17 DIAGNOSIS — I2510 Atherosclerotic heart disease of native coronary artery without angina pectoris: Secondary | ICD-10-CM | POA: Diagnosis present

## 2021-05-17 DIAGNOSIS — Z79899 Other long term (current) drug therapy: Secondary | ICD-10-CM

## 2021-05-17 DIAGNOSIS — G4733 Obstructive sleep apnea (adult) (pediatric): Secondary | ICD-10-CM | POA: Diagnosis present

## 2021-05-17 LAB — CBC WITH DIFFERENTIAL/PLATELET
Abs Immature Granulocytes: 0.02 10*3/uL (ref 0.00–0.07)
Basophils Absolute: 0 10*3/uL (ref 0.0–0.1)
Basophils Relative: 1 %
Eosinophils Absolute: 0.2 10*3/uL (ref 0.0–0.5)
Eosinophils Relative: 3 %
HCT: 35.1 % — ABNORMAL LOW (ref 39.0–52.0)
Hemoglobin: 11 g/dL — ABNORMAL LOW (ref 13.0–17.0)
Immature Granulocytes: 0 %
Lymphocytes Relative: 12 %
Lymphs Abs: 0.7 10*3/uL (ref 0.7–4.0)
MCH: 28.5 pg (ref 26.0–34.0)
MCHC: 31.3 g/dL (ref 30.0–36.0)
MCV: 90.9 fL (ref 80.0–100.0)
Monocytes Absolute: 0.6 10*3/uL (ref 0.1–1.0)
Monocytes Relative: 11 %
Neutro Abs: 3.9 10*3/uL (ref 1.7–7.7)
Neutrophils Relative %: 73 %
Platelets: 188 10*3/uL (ref 150–400)
RBC: 3.86 MIL/uL — ABNORMAL LOW (ref 4.22–5.81)
RDW: 14.9 % (ref 11.5–15.5)
WBC: 5.4 10*3/uL (ref 4.0–10.5)
nRBC: 0 % (ref 0.0–0.2)

## 2021-05-17 LAB — COMPREHENSIVE METABOLIC PANEL
ALT: 27 U/L (ref 0–44)
AST: 15 U/L (ref 15–41)
Albumin: 3.6 g/dL (ref 3.5–5.0)
Alkaline Phosphatase: 46 U/L (ref 38–126)
Anion gap: 14 (ref 5–15)
BUN: 37 mg/dL — ABNORMAL HIGH (ref 8–23)
CO2: 24 mmol/L (ref 22–32)
Calcium: 9 mg/dL (ref 8.9–10.3)
Chloride: 99 mmol/L (ref 98–111)
Creatinine, Ser: 8.73 mg/dL — ABNORMAL HIGH (ref 0.61–1.24)
GFR, Estimated: 6 mL/min — ABNORMAL LOW (ref >60)
Glucose, Bld: 138 mg/dL — ABNORMAL HIGH (ref 70–99)
Potassium: 3.3 mmol/L — ABNORMAL LOW (ref 3.5–5.1)
Sodium: 137 mmol/L (ref 135–145)
Total Bilirubin: 1.3 mg/dL — ABNORMAL HIGH (ref 0.3–1.2)
Total Protein: 6.6 g/dL (ref 6.5–8.1)

## 2021-05-17 LAB — BRAIN NATRIURETIC PEPTIDE: B Natriuretic Peptide: >4500 pg/mL — ABNORMAL HIGH (ref 0.0–100.0)

## 2021-05-17 LAB — TROPONIN I (HIGH SENSITIVITY): Troponin I (High Sensitivity): 85 ng/L — ABNORMAL HIGH (ref <18)

## 2021-05-17 MED ORDER — HYDRALAZINE HCL 50 MG PO TABS
100.0000 mg | ORAL_TABLET | Freq: Three times a day (TID) | ORAL | Status: DC
  Administered 2021-05-17 – 2021-05-20 (×9): 100 mg via ORAL
  Filled 2021-05-17 (×9): qty 2

## 2021-05-17 MED ORDER — PRAVASTATIN SODIUM 40 MG PO TABS
40.0000 mg | ORAL_TABLET | Freq: Every day | ORAL | Status: DC
  Administered 2021-05-17 – 2021-05-20 (×4): 40 mg via ORAL
  Filled 2021-05-17 (×4): qty 1

## 2021-05-17 MED ORDER — FUROSEMIDE 10 MG/ML IJ SOLN
120.0000 mg | Freq: Once | INTRAVENOUS | Status: AC
  Administered 2021-05-17: 120 mg via INTRAVENOUS
  Filled 2021-05-17: qty 2

## 2021-05-17 MED ORDER — HEPARIN SODIUM (PORCINE) 5000 UNIT/ML IJ SOLN
5000.0000 [IU] | Freq: Three times a day (TID) | INTRAMUSCULAR | Status: DC
  Administered 2021-05-17 – 2021-05-20 (×8): 5000 [IU] via SUBCUTANEOUS
  Filled 2021-05-17 (×9): qty 1

## 2021-05-17 NOTE — ED Triage Notes (Signed)
Patient here for evaluation of shortness of breath that started approximately one and a half months ago. Denies pain. States shortness of breath is exacerbated by even minimal exertion, also gets worse at night when laying down. Denies weight gain.

## 2021-05-17 NOTE — ED Notes (Signed)
Pt not responding for vital recheck

## 2021-05-17 NOTE — ED Provider Notes (Signed)
Emergency Medicine Provider Triage Evaluation Note  Charles Hart , a 65 y.o. male  was evaluated in triage.  Pt complains of progressively worsening shortness of breath.  Patient has a history of chronic kidney disease and solitary kidney.  He states that he was told he may need a kidney transplant.  He reports worsening shortness of breath with exertion and a smothering sensation at night over the past couple of months.  He denies weight gain or lower extremity swelling.  No associated chest pain or abdominal pain.  No fever but he does have a cough.  Review of Systems  Positive: Shortness of breath Negative: Leg swelling  Physical Exam  BP (!) 153/91 (BP Location: Right Arm)   Pulse 100   Temp 98.1 F (36.7 C) (Oral)   Resp 17   SpO2 97%  Gen:   Awake, no distress   Resp:  Normal effort  MSK:   Moves extremities without difficulty, no LE edema Other:    Medical Decision Making  Medically screening exam initiated at 2:39 PM.  Appropriate orders placed.  Charles Hart was informed that the remainder of the evaluation will be completed by another provider, this initial triage assessment does not replace that evaluation, and the importance of remaining in the ED until their evaluation is complete.     Charles Cater, PA-C 05/17/21 1442    Charles Ferguson, MD 05/22/21 [id number]

## 2021-05-17 NOTE — H&P (Signed)
Date: 05/17/2021               Patient Name:  Charles Hart MRN: [id number]  DOB: 1956/07/25 Age / Sex: 65 y.o., male   PCP: Lucious Groves, DO         Medical Service: Internal Medicine Teaching Service         Attending Physician: Dr. Jimmye Norman, Elaina Pattee, MD    First Contact: Idamae Schuller MD Pager: Teodora Medici [phone]  Second Contact: Jose Persia, MD Pager: IB [phone]       After Hours (After 5p/  First Contact Pager: [phone]  weekends / holidays): Second Contact Pager: [phone]   SUBJECTIVE   Chief Complaint: Shortness of breath  History of Present Illness: 65 year old male with a history of heart failure and end-stage renal disease on hemodialysis T/R/Sa who presents with complaints of intermittent shortness of breath worsening over the past several months.  He reports that he has noticed increased shortness of breath with increasing dyspnea on exertion.  He reports that previously he was able to walk several miles on his own and is now only able to walk several yards.  He also endorses that he feels more short of breath when he lies back and cannot lie back for very long.  Does endorse that he has noticed some mildly increased swelling in his legs.   He reports good compliance with hemodialysis and denies missing sessions, and he reports that he is pretty good with medication adherence typically only missing 1 dose of medication per week. He is not needing supplemental oxygen.  He does not have symptoms when sitting up in bed.  He denies any current chest pain, headache, or belly pain.  He is on the Lasix at home and reports he does still make urine, urinates roughly 3-4 times a day, however he has noticed decreased output recently.    ED Course: While in the ED CMP showed potassium 3.3 BNP 4500 troponin low at 85.  Chest x-ray showed cardiomegaly with central pulmonary vascular congestion.  EKG showed sinus tach.  Not requiring supplemental oxygen at this time.  Cardiology was consulted.   Admitted for symptomatic volume overload.       Meds:  Current Meds  Medication Sig   acetaminophen (TYLENOL) 500 MG tablet Take 1,000 mg by mouth every 6 (six) hours as needed.   calcium acetate (PHOSLO) 667 MG capsule Take 2,001 mg by mouth 3 (three) times daily with meals.    carvedilol (COREG) 25 MG tablet Take 1 tablet (25 mg total) by mouth 2 (two) times daily with a meal.   famotidine (PEPCID) 20 MG tablet One after supper (Patient taking differently: Take 20 mg by mouth daily after supper.)   fluticasone (FLONASE) 50 MCG/ACT nasal spray Place 2 sprays into both nostrils as needed for allergies.   folic acid-vitamin b complex-vitamin c-selenium-zinc (DIALYVITE) 3 MG TABS tablet Take 1 tablet by mouth daily.   furosemide (LASIX) 80 MG tablet Take 1 tablet (80 mg total) by mouth 2 (two) times daily.   gabapentin (NEURONTIN) 300 MG capsule Take 300 mg by mouth at bedtime.   hydrALAZINE (APRESOLINE) 100 MG tablet TAKE 1 TABLET(100 MG) BY MOUTH THREE TIMES DAILY (Patient taking differently: Take 100 mg by mouth 3 (three) times daily. TAKE 1 TABLET(100 MG) BY MOUTH THREE TIMES DAILY)   meclizine (ANTIVERT) 12.5 MG tablet Take 1 tablet (12.5 mg total) by mouth 3 (three) times daily as needed for dizziness.   pantoprazole (  PROTONIX) 40 MG tablet Take 1 tablet (40 mg total) by mouth daily.   pravastatin (PRAVACHOL) 40 MG tablet Take 1 tablet (40 mg total) by mouth daily. (Patient taking differently: Take 40 mg by mouth at bedtime.)   sildenafil (VIAGRA) 50 MG tablet Take 1 tablet (50 mg total) by mouth as needed for erectile dysfunction. (Patient taking differently: Take 50 mg by mouth daily as needed for erectile dysfunction.)   SUPER B COMPLEX/C PO Take 1 tablet by mouth daily.   [DISCONTINUED] Cyanocobalamin (VITAMIN B-12 PO) Take 2,500 mcg by mouth daily.    Past Medical History:  Diagnosis Date   Adenomatous colon polyp 07/02/2011   Last colonoscopy May 06, 2011 by Dr. Owens Loffler, who recommended repeat colonoscopy in 5 years.    Anemia    Background diabetic retinopathy 04/20/2012   Patient is followed by Dr. Katy Fitch    Cancer [facility])    Kidney   Cardiomyopathy    LV function improved from 2004 to 2008.  Historically, moderately dilated LV with EF 30-40% by 2D echo 08/14/2002.  Mild CAD with severe LV dysfunction by cardiac cath 09/2002.  Normal coronary arteries and normal LV function by cardiac cath 09/19/2006.  A 2-D echo on 04/01/2009 showed mild concentric hypertrophy and normal systolic (LVEF  37-48%) and doppler C/W with grade 1 diastolic dysfunction.   CHF (congestive heart failure) (HCC)    LV function improved from 2004 to 2008.  Historically, moderately dilated LV with EF 30-40% by 2D echo 08/14/2002.  Mild CAD with severe LV dysfunction by cardiac cath 09/2002.  Normal coronary arteries and normal LV function by cardiac cath 09/19/2006.  A 2-D echo on 04/01/2009 showed mild concentric hypertrophy and normal systolic (LVEF  27-07%) and doppler C/W with grade 1 diastolic dysfunction..    Chronic combined systolic and diastolic congestive heart failure ([place]) 05/21/2010   LV function improved from 2004 to 2008.  Historically, moderately dilated LV with EF 30-40% by 2D echo 08/14/2002.  Mild CAD with severe LV dysfunction by cardiac cath 09/2002.  Normal coronary arteries and normal LV function by cardiac cath 09/19/2006.  A 2-D echo on 04/01/2009 showed mild concentric hypertrophy and normal systolic (LVEF  86-75%) and doppler parameters consistent with abnormal left    CVA (cerebral vascular accident) ([place]) 07/04/2012   MRI of the brain 07/04/2012 showed an acute infarct in the right basal ganglia involving the anterior putamen, anterior limb internal capsule, and head of the caudate; this measured approximately 2.5 cm in diameter.      Dermatitis    Diabetes mellitus    type 2   DIABETIC PERIPHERAL NEUROPATHY 08/03/2007   Qualifier: Diagnosis of  By: Marinda Elk MD, Sonia Side      DM neuropathy, painful ([place])    fingers and right knee   ESRD (end stage renal disease) ([place])    Stage 4, on hemodailysis x 4 months as of 05-18-18 [facility], tues thurs sat   ESRD (end stage renal disease) on dialysis ([place])    "TTS; [facility]" (05/31/2018)   Hearing loss in right ear    Hyperlipidemia    Hypertension    Hypertensive crisis 07/28/2012   Hypertensive urgency 08/20/2014   Myocardial infarction [facility])  many yrs ago   Nephrotic syndrome 02/18/2013   A 24-hour urine collection 03/04/2013 showed total protein of 5,460 g and creatinine clearance of 80 mL/minute.  Patient was seen by Seward Meth at [place] and a repeat 24-hour urine  showed 10,407 mg protein.  Patient underwent kidney biopsy on 05/30/2013; pathology showed advanced diffuse and nodular diabetic nephropathy with vascular changes consistent with long-standing difficult to control hypertension.      Pneumonia 2014 and 2015   Renal insufficiency    Sleep apnea    11/06/20 - hasn't used it "in a while"    Past Surgical History:  Procedure Laterality Date   AV FISTULA PLACEMENT Left 11/24/2017   Procedure: INSERTION OF ARTERIOVENOUS (AV) GORE-TEX GRAFT LEFT LOWER ARM;  Surgeon: Rosetta Posner, MD;  Location: [facility];  Service: Vascular;  Laterality: Left;   CARDIAC CATHETERIZATION      4 times   COLONOSCOPY     COLONOSCOPY WITH PROPOFOL N/A 06/18/2020   Procedure: COLONOSCOPY WITH PROPOFOL;  Surgeon: Milus Banister, MD;  Location: [facility];  Service: Endoscopy;  Laterality: N/A;   FOOT SURGERY     INCISIONAL HERNIA REPAIR N/A 11/09/2020   Procedure: OPEN INCISIONAL HERNIA REPAIR WITH MESH;  Surgeon: Ralene Ok, MD;  Location: [facility];  Service: General;  Laterality: N/A;   INSERTION OF MESH N/A 11/09/2020   Procedure: INSERTION OF MESH;  Surgeon: Ralene Ok, MD;  [facility] OR;  Service: General;  Laterality: N/A;   POLYPECTOMY     POLYPECTOMY  06/18/2020    Procedure: POLYPECTOMY;  Surgeon: Milus Banister, MD;  Location: [facility];  Service: Endoscopy;;   ROBOT ASSISTED LAPAROSCOPIC NEPHRECTOMY Left 05/30/2018   Procedure: XI ROBOTIC ASSISTED LAPAROSCOPIC NEPHRECTOMY;  Surgeon: Alexis Frock, MD;  Location: [facility];  Service: Urology;  Laterality: Left;    Social:  Lives With: none Occupation: Support: None Level of Function: Previously independent PCP: [name] Substances: None  Family History: None  Allergies: Allergies as of 05/17/2021 - Review Complete 05/17/2021  Allergen Reaction Noted   Ivp dye [iodinated diagnostic agents] Other (See Comments) 02/17/2016   Hydrocodone  05/30/2018   Phentermine Swelling 04/24/2019   Amlodipine Swelling 07/10/2013   Lisinopril Cough 06/19/2013   Red dye Other (See Comments) 02/17/2016    Review of Systems: A complete ROS was negative except as per HPI.   OBJECTIVE:   Physical Exam: Blood pressure (!) 150/84, pulse 66, temperature 98.1 F (36.7 C), temperature source Oral, resp. rate (!) 33, height 5\' 9"  (1.753 m), weight 90.7 kg, SpO2 96 %.  Constitutional: well-appearing comfortable sitting in bed, in no acute distress HENT: normocephalic atraumatic, mucous membranes moist Eyes: conjunctiva non-erythematous Neck: supple JVD Cardiovascular: JVD noted to angle of the mandible, mild lower extremity edema laterally regular rate and rhythm, no m/r/g Pulmonary/Chest: normal work of breathing on room air, crackles in bilateral lung bases Abdominal: soft, non-tender, non-distended MSK: normal bulk and tone Neurological: alert & oriented x 3, 5/5 strength in bilateral upper and lower extremities Skin: warm and dry Psych: Mood and affect appropriate  Labs: CBC    Component Value Date/Time   WBC 5.4 05/17/2021 1442   RBC 3.86 (L) 05/17/2021 1442   HGB 11.0 (L) 05/17/2021 1442   HGB 10.0 (L) 09/07/2017 1028   HCT 35.1 (L) 05/17/2021 1442   HCT 29.7 (L) 09/07/2017 1028   PLT  188 05/17/2021 1442   PLT 261 09/07/2017 1028   MCV 90.9 05/17/2021 1442   MCV 85 09/07/2017 1028   MCH 28.5 05/17/2021 1442   MCHC 31.3 05/17/2021 1442   RDW 14.9 05/17/2021 1442   RDW 13.3 09/07/2017 1028   LYMPHSABS 0.7 05/17/2021 1442   LYMPHSABS 0.7 09/07/2017 1028  MONOABS 0.6 05/17/2021 1442   EOSABS 0.2 05/17/2021 1442   EOSABS 0.2 09/07/2017 1028   BASOSABS 0.0 05/17/2021 1442   BASOSABS 0.0 09/07/2017 1028     CMP     Component Value Date/Time   NA 137 05/17/2021 1442   NA 141 08/01/2019 0844   K 3.3 (L) 05/17/2021 1442   CL 99 05/17/2021 1442   CO2 24 05/17/2021 1442   GLUCOSE 138 (H) 05/17/2021 1442   BUN 37 (H) 05/17/2021 1442   BUN 73 (H) 08/01/2019 0844   CREATININE 8.73 (H) 05/17/2021 1442   CREATININE 3.06 (H) 11/26/2014 1201   CALCIUM 9.0 05/17/2021 1442   PROT 6.6 05/17/2021 1442   ALBUMIN 3.6 05/17/2021 1442   ALBUMIN 4.4 08/01/2019 0844   AST 15 05/17/2021 1442   ALT 27 05/17/2021 1442   ALKPHOS 46 05/17/2021 1442   BILITOT 1.3 (H) 05/17/2021 1442   GFRNONAA 6 (L) 05/17/2021 1442   GFRNONAA 22 (L) 11/26/2014 1201   GFRAA 6 (L) 08/01/2019 0844   GFRAA 25 (L) 11/26/2014 1201    Imaging: DG Chest 2 View  Result Date: 05/17/2021 CLINICAL DATA:  Shortness of breath with cough. EXAM: CHEST - 2 VIEW COMPARISON:  Chest x-ray 03/15/2021. FINDINGS: Heart is enlarged, unchanged. There is central pulmonary vascular congestion. The lungs are clear. There is no pleural effusion or pneumothorax. No acute fractures are seen. There is a healed right seventh rib fracture. IMPRESSION: 1. Cardiomegaly with central pulmonary vascular congestion. Electronically Signed   By: Ronney Asters M.D.   On: 05/17/2021 16:04    EKG: personally reviewed my interpretation issinus tach   ASSESSMENT & PLAN:    Assessment & Plan by Problem: Active Problems:   Acute exacerbation of CHF (congestive heart failure) ([place])   Charles Hart is a 65 y.o. with pertinent PMH of  chronic combined systolic diastolic congestive heart failure, end-stage renal disease on hemodialysis Tuesday Thursday Saturday, type 2 diabetes with complications and essential hypertension who presented with shortness of breath and admitted for symptomatic volume overload on hospital day 0   #Symptomatic volume overload with elevated blood pressure  Acute on chronic combined systolic diastolic congestive heart failure Dyspnea on exertion and orthopnea.  Not currently requiring any supplemental oxygen.  Breathing comfortably on room air. JVD to angle of mandible and 1+ lower extremity edema bilaterally to the knee - Cardiology was consulted in the emergency department - Continue IV Lasix monitor strict I's and O's Daily weights. -Last echo December 2021 with EF 35%.  Follow-up echo for further reduction in EF - Will hold beta-blocker until echo complete - Follow-up troponin  # ESRD with symptomatic volume overload  Has HD done T/R/Sa . Patient reports he is compliant with hemodialysis goes each scheduled day.   Still makes some urine takes lasix 80 BID, good adherence to medications stating that at most he misses 1 dose of medicine per week. K 3.3 - Nephrology consulted and made aware. - Plan for normal scheduled dialysis Tuesday Thursday Saturday.  Primary hypertension. Elevated blood pressures likely in setting of volume overload.  Anticipate this will improve with Lasix and hemodialysis.  He is asymptomatic currently. Will hold his beta-blocker until echo can be done.  Continue hydralazine for now.  #Hypokalemia 3.3 Will defer supplementation to nephrology when he has HD done  Type 2 diabetes. Sliding scale insulin and CBG checks.  Normocytic anemia Likely related to his chronic kidney disease. Most recent hemoglobin 11. Continue to monitor  Obstructive sleep apnea  Diet: Carb/Renal VTE: Heparin IVF: None,None Code: Full  Prior to Admission Living Arrangement: Home,  living   Anticipated Discharge Location: Home Barriers to Discharge: Pending medical improvement  Dispo: Admit patient to Inpatient with expected length of stay greater than 2 midnights.  Signed: Delene Ruffini, MD Internal Medicine Resident PGY-1 Pager: [phone]  05/17/2021, 9:53 PM

## 2021-05-17 NOTE — ED Provider Notes (Signed)
[facility] EMERGENCY DEPARTMENT Provider Note   CSN: [id number] Arrival date & time: 05/17/21  1314     History Chief Complaint  Patient presents with   Shortness of Breath    Charles Hart is a 65 y.o. male.  HPI This is a 65 year old male with ESRD on dialysis, HFrEF (last EF 35%), diabetes, hypertension, hyperlipidemia, and prior stroke who presents with shortness of breath.  Patient reports 2 months of worsening shortness of breath that is worse when lying flat.  Associated with nonproductive cough.  Denies chest pain, fever, or lower extremity swelling.  Denies any history of blood clots and is not currently on a blood thinner.  Gets dialysis Tuesday Thursday Saturday and denies any missed sessions.  Takes 80 mg of Lasix twice daily    Past Medical History:  Diagnosis Date   Adenomatous colon polyp 07/02/2011   Last colonoscopy May 06, 2011 by Dr. Owens Loffler, who recommended repeat colonoscopy in 5 years.    Anemia    Background diabetic retinopathy 04/20/2012   Patient is followed by Dr. Katy Fitch    Cancer [facility])    Kidney   Cardiomyopathy    LV function improved from 2004 to 2008.  Historically, moderately dilated LV with EF 30-40% by 2D echo 08/14/2002.  Mild CAD with severe LV dysfunction by cardiac cath 09/2002.  Normal coronary arteries and normal LV function by cardiac cath 09/19/2006.  A 2-D echo on 04/01/2009 showed mild concentric hypertrophy and normal systolic (LVEF  95-63%) and doppler C/W with grade 1 diastolic dysfunction.   CHF (congestive heart failure) (HCC)    LV function improved from 2004 to 2008.  Historically, moderately dilated LV with EF 30-40% by 2D echo 08/14/2002.  Mild CAD with severe LV dysfunction by cardiac cath 09/2002.  Normal coronary arteries and normal LV function by cardiac cath 09/19/2006.  A 2-D echo on 04/01/2009 showed mild concentric hypertrophy and normal systolic (LVEF  87-56%) and doppler C/W with grade 1 diastolic  dysfunction..    Chronic combined systolic and diastolic congestive heart failure ([place]) 05/21/2010   LV function improved from 2004 to 2008.  Historically, moderately dilated LV with EF 30-40% by 2D echo 08/14/2002.  Mild CAD with severe LV dysfunction by cardiac cath 09/2002.  Normal coronary arteries and normal LV function by cardiac cath 09/19/2006.  A 2-D echo on 04/01/2009 showed mild concentric hypertrophy and normal systolic (LVEF  43-32%) and doppler parameters consistent with abnormal left    CVA (cerebral vascular accident) ([place]) 07/04/2012   MRI of the brain 07/04/2012 showed an acute infarct in the right basal ganglia involving the anterior putamen, anterior limb internal capsule, and head of the caudate; this measured approximately 2.5 cm in diameter.      Dermatitis    Diabetes mellitus    type 2   DIABETIC PERIPHERAL NEUROPATHY 08/03/2007   Qualifier: Diagnosis of  By: Marinda Elk MD, Sonia Side     DM neuropathy, painful ([place])    fingers and right knee   ESRD (end stage renal disease) ([place])    Stage 4, on hemodailysis x 4 months as of 05-18-18 [place] [facility], tues thurs sat   ESRD (end stage renal disease) on dialysis ([place])    "TTS; [facility]" (05/31/2018)   Hearing loss in right ear    Hyperlipidemia    Hypertension    Hypertensive crisis 07/28/2012   Hypertensive urgency 08/20/2014   Myocardial infarction [facility])  many yrs ago   Nephrotic syndrome  02/18/2013   A 24-hour urine collection 03/04/2013 showed total protein of 5,460 g and creatinine clearance of 80 mL/minute.  Patient was seen by Seward Meth at [facility] and a repeat 24-hour urine showed 10,407 mg protein.  Patient underwent kidney biopsy on 05/30/2013; pathology showed advanced diffuse and nodular diabetic nephropathy with vascular changes consistent with long-standing difficult to control hypertension.      Pneumonia 2014 and 2015   Renal insufficiency    Sleep apnea    11/06/20 - hasn't used  it "in a while"    Patient Active Problem List   Diagnosis Date Noted   Upper airway cough syndrome 03/26/2021   S/P hernia repair 11/09/2020   Ventral hernia without obstruction or gangrene 05/26/2020   Surgical pneumoperitoneum    Trochanteric bursitis of right hip 04/25/2018   Aortic atherosclerosis ([place]) 12/07/2017   ESRD on hemodialysis ([place]) 11/24/2017   S/P arteriovenous (AV) fistula creation 11/24/2017   DOE (dyspnea on exertion) 11/08/2017   History of renal cell carcinoma 09/06/2017   Piriformis syndrome of right side 03/03/2016   Muscle spasm of left shoulder area 03/03/2016   Secondary hyperparathyroidism, renal ([place]) 02/09/2016   Primary osteoarthritis of left knee 01/07/2016   Low back pain 01/07/2016   Joint stiffness 11/13/2015   Age-related nuclear cataract of both eyes 10/07/2015   Normocytic anemia 08/22/2014   Health care maintenance 05/16/2014   Nephrotic syndrome in diseases classified elsewhere 02/18/2013   Obstructive sleep apnea 07/26/2012   History of stroke 07/04/2012   Controlled type 2 diabetes mellitus with both eyes affected by proliferative retinopathy without macular edema, with long-term current use of insulin ([place]) 04/20/2012   ED (erectile dysfunction) 04/18/2012   Benign neoplasm of colon 07/02/2011   Chronic combined systolic and diastolic congestive heart failure ([place]) 05/21/2010   Carotid bruit 04/01/2009   Type 2 diabetes mellitus with diabetic neuropathy ([place]) 08/03/2007   Hearing loss 07/30/2007   Controlled type 2 diabetes mellitus with chronic kidney disease ([place]) 09/15/2006   Other and unspecified hyperlipidemia 06/01/2006   Essential (primary) hypertension 06/01/2006    Past Surgical History:  Procedure Laterality Date   AV FISTULA PLACEMENT Left 11/24/2017   Procedure: INSERTION OF ARTERIOVENOUS (AV) GORE-TEX GRAFT LEFT LOWER ARM;  Surgeon: Rosetta Posner, MD;  Location: [place];  Service: Vascular;  Laterality: Left;   CARDIAC  CATHETERIZATION      4 times   COLONOSCOPY     COLONOSCOPY WITH PROPOFOL N/A 06/18/2020   Procedure: COLONOSCOPY WITH PROPOFOL;  Surgeon: Milus Banister, MD;  Location: [facility] END[facility]COPY;  Service: Endoscopy;  Laterality: N/A;   FOOT SURGERY     INCISIONAL HERNIA REPAIR N/A 11/09/2020   Procedure: OPEN INCISIONAL HERNIA REPAIR WITH MESH;  Surgeon: Ralene Ok, MD;  Location: [place];  Service: General;  Laterality: N/A;   INSERTION OF MESH N/A 11/09/2020   Procedure: INSERTION OF MESH;  Surgeon: Ralene Ok, MD;  Location: [facility] OR;  Service: General;  Laterality: N/A;   POLYPECTOMY     POLYPECTOMY  06/18/2020   Procedure: POLYPECTOMY;  Surgeon: Milus Banister, MD;  Location: [facility] ENDOSCOPY;  Service: Endoscopy;;   ROBOT ASSISTED LAPAROSCOPIC NEPHRECTOMY Left 05/30/2018   Procedure: XI ROBOTIC ASSISTED LAPAROSCOPIC NEPHRECTOMY;  Surgeon: Alexis Frock, MD;  Location: [facility] ORS;  Service: Urology;  Laterality: Left;       Family History  Problem Relation Age of Onset   Aneurysm Father 68  died of rupture   Colon cancer Sister     Social History   Tobacco Use   Smoking status: Never   Smokeless tobacco: Never  Vaping Use   Vaping Use: Never used  Substance Use Topics   Alcohol use: No    Alcohol/week: 0.0 standard drinks   Drug use: No    Home Medications Prior to Admission medications   Medication Sig Start Date End Date Taking? Authorizing Provider  acetaminophen (TYLENOL) 325 MG tablet Take 2 tablets (650 mg total) by mouth every 6 (six) hours as needed for mild pain. 11/18/20   Norm Parcel, PA-C  acetaminophen (TYLENOL) 500 MG tablet Take 1,000 mg by mouth every 6 (six) hours as needed.    [provider]  calcium acetate (PHOSLO) 667 MG capsule Take 2,001 mg by mouth 3 (three) times daily with meals.  12/15/17   Tye Savoy, MD  carvedilol (COREG) 25 MG tablet Take 1 tablet (25 mg total) by mouth 2 (two) times daily with a meal. 09/27/18   Lucious Groves, DO  Cyanocobalamin (VITAMIN B-12 PO) Take 2,500 mcg by mouth daily.    [provider]  famotidine (PEPCID) 20 MG tablet One after supper 03/26/21   Tanda Rockers, MD  fluticasone [facility]) 50 MCG/ACT nasal spray Place 2 sprays into both nostrils as needed for allergies. 09/27/18   Lucious Groves, DO  folic acid-vitamin b complex-vitamin c-selenium-zinc (DIALYVITE) 3 MG TABS tablet Take 1 tablet by mouth daily.    [provider]  furosemide (LASIX) 80 MG tablet Take 1 tablet (80 mg total) by mouth 2 (two) times daily. 01/28/20   Lucious Groves, DO  gabapentin (NEURONTIN) 300 MG capsule Take 300 mg by mouth at bedtime.    [provider]  hydrALAZINE (APRESOLINE) 100 MG tablet TAKE 1 TABLET(100 MG) BY MOUTH THREE TIMES DAILY 12/03/20   Lucious Groves, DO  meclizine (ANTIVERT) 12.5 MG tablet Take 1 tablet (12.5 mg total) by mouth 3 (three) times daily as needed for dizziness. 12/12/20   Long, Wonda Olds, MD  pantoprazole (PROTONIX) 40 MG tablet Take 1 tablet (40 mg total) by mouth daily. 03/26/21   Tanda Rockers, MD  pravastatin (PRAVACHOL) 40 MG tablet Take 1 tablet (40 mg total) by mouth daily. 01/28/20   Lucious Groves, DO  sildenafil (VIAGRA) 50 MG tablet Take 1 tablet (50 mg total) by mouth as needed for erectile dysfunction. Patient taking differently: Take 50 mg by mouth daily as needed for erectile dysfunction. 11/01/19 10/31/20  Lucious Groves, DO  SUPER B COMPLEX/C PO Take 1 tablet by mouth daily.    [provider]    Allergies    Ivp dye [iodinated diagnostic agents], Hydrocodone, Phentermine, Amlodipine, Lisinopril, and Red dye  Review of Systems   Review of Systems  Constitutional:  Negative for chills and fever.  HENT:  Negative for ear pain and sore throat.   Eyes:  Negative for pain and visual disturbance.  Respiratory:  Positive for shortness of breath. Negative for cough.   Cardiovascular:  Negative for chest pain and palpitations.   Gastrointestinal:  Negative for abdominal pain and vomiting.  Genitourinary:  Negative for dysuria and hematuria.  Musculoskeletal:  Negative for arthralgias and back pain.  Skin:  Negative for color change and rash.  Neurological:  Negative for seizures and syncope.  All other systems reviewed and are negative.  Physical Exam Updated Vital Signs BP (!) 148/81 (BP  Location: Right Arm)   Pulse (!) 101   Temp 98.1 F (36.7 C) (Oral)   Resp 19   SpO2 98%   Physical Exam Vitals and nursing note reviewed.  Constitutional:      Appearance: He is well-developed.  HENT:     Head: Normocephalic and atraumatic.  Eyes:     Conjunctiva/sclera: Conjunctivae normal.  Neck:     Vascular: JVD present.  Cardiovascular:     Rate and Rhythm: Normal rate and regular rhythm.     Heart sounds: No murmur heard. Pulmonary:     Effort: Pulmonary effort is normal. No respiratory distress.     Breath sounds: Examination of the right-middle field reveals rales. Examination of the left-middle field reveals rales. Examination of the right-lower field reveals rales. Examination of the left-lower field reveals rales. Rales present.  Abdominal:     Palpations: Abdomen is soft.     Tenderness: There is no abdominal tenderness.  Musculoskeletal:     Cervical back: Neck supple.     Right lower leg: No tenderness. Edema present.     Left lower leg: No tenderness. Edema present.  Skin:    General: Skin is warm and dry.  Neurological:     Mental Status: He is alert.    ED Results / Procedures / Treatments   Labs (all labs ordered are listed, but only abnormal results are displayed) Labs Reviewed  CBC WITH DIFFERENTIAL/PLATELET - Abnormal; Notable for the following components:      Result Value   RBC 3.86 (*)    Hemoglobin 11.0 (*)    HCT 35.1 (*)    All other components within normal limits  COMPREHENSIVE METABOLIC PANEL - Abnormal; Notable for the following components:   Potassium 3.3 (*)     Glucose, Bld 138 (*)    BUN 37 (*)    Creatinine, Ser 8.73 (*)    Total Bilirubin 1.3 (*)    GFR, Estimated 6 (*)    All other components within normal limits  BRAIN NATRIURETIC PEPTIDE - Abnormal; Notable for the following components:   B Natriuretic Peptide >4,500.0 (*)    All other components within normal limits  TROPONIN I (HIGH SENSITIVITY) - Abnormal; Notable for the following components:   Troponin I (High Sensitivity) 85 (*)    All other components within normal limits    EKG EKG Interpretation  Date/Time:  Monday May 17 2021 14:28:54 EDT Ventricular Rate:  101 PR Interval:  188 QRS Duration: 118 QT Interval:  402 QTC Calculation: 521 R Axis:   16 Text Interpretation: Sinus tachycardia Left ventricular hypertrophy with QRS widening and repolarization abnormality ( R in aVL , Sokolow-Lyon , Cornell product ) Prolonged QT Abnormal ECG since last tracing no significant change Confirmed by Noemi Chapel [phone]) on 05/17/2021 6:13:37 PM  Radiology DG Chest 2 View  Result Date: 05/17/2021 CLINICAL DATA:  Shortness of breath with cough. EXAM: CHEST - 2 VIEW COMPARISON:  Chest x-ray 03/15/2021. FINDINGS: Heart is enlarged, unchanged. There is central pulmonary vascular congestion. The lungs are clear. There is no pleural effusion or pneumothorax. No acute fractures are seen. There is a healed right seventh rib fracture. IMPRESSION: 1. Cardiomegaly with central pulmonary vascular congestion. Electronically Signed   By: Ronney Asters M.D.   On: 05/17/2021 16:04    Procedures Procedures   Medications Ordered in ED Medications - No data to display  ED Course  I have reviewed the triage vital signs and the nursing notes.  Pertinent labs & imaging results that were available during my care of the patient were reviewed by me and considered in my medical decision making (see chart for details).    MDM Rules/Calculators/A&P                          Patient is hemodynamically  stable on arrival and well-appearing.  Items on the differential for shortness of breath include ACS, anemia, pneumonia, pneumothorax, CHF exacerbation, or volume overload secondary to ESRD on dialysis.  Low suspicion for PE given no hypoxia, tachycardia, pleuritic chest pain. He has JVD, bibasilar crackles on lung exam, +1 pitting edema on the lower extremities bilaterally.  Chest x-ray with significant pulmonary edema, and without pneumonia, pneumothorax, or other cardiopulmonary abnormalities. BNP elevated greater than 4500. All consistent with likely volume overload, unclear if related to true worsening of CHF or volume overload in the setting of ESRD.  Troponin elevated at 85 but no ongoing chest pain, likely related to decreased renal clearance in the setting of ESRD.  Low suspicion for acute ACS.  CBC with normal WBC and stable hgb. Cr and BUN elevated as expected but CMP otherwise unremarkable. Given severe orthopnea and pulmonary edema on CXR patient will benefit from admission for IV lasix and dialysis to get volume off. Given 120 IV lasix. Spoke with cardiology who agrees to medical admit for improvement of volume status.   Patient admitted to medicine.   Final Clinical Impression(s) / ED Diagnoses Final diagnoses:  Acute pulmonary edema ([place])  Acute on chronic congestive heart failure, unspecified heart failure type [facility])    Rx / DC Orders ED Discharge Orders     None        Coralee Pesa, MD 05/17/21 [id number]    Noemi Chapel, MD 05/19/21 [phone]

## 2021-05-17 NOTE — ED Provider Notes (Signed)
I saw and evaluated the patient, reviewed the resident's note and I agree with the findings and plan.  Pertinent History: t.th.s - dialysis - has not missed - but is SOB - has known CHF, still makes urine - takes lasix 80 bid - urinates daily - was told by dialysis to stop taking lasix b/c of over diuresis.  Here with SOB for 2 months - waxes and wanes, has orthopnea - non productive cough - no COPD.  No swelling or CP.  Pt is on transplant list at [facility] for kidney transplant Follows with Dr. [name] with cardiology. Known EF of 35%  Pertinent Exam findings: no leg swelling, mild tachycardic, lungs with rales, no wheezing - no leg swelling / edema - + JVD.    I was personally present and directly supervised the following procedures:  Medical evaluation.  I personally interpreted the EKG as well as the resident and agree with the interpretation on the resident's chart.  Final diagnoses:  None      Noemi Chapel, MD 05/19/21 1141

## 2021-05-17 NOTE — ED Notes (Signed)
Pt not responding for vital recheck 2x

## 2021-05-17 NOTE — ED Notes (Signed)
T, Th, Sa dialysis pt

## 2021-05-18 ENCOUNTER — Observation Stay (HOSPITAL_COMMUNITY): Payer: 59

## 2021-05-18 DIAGNOSIS — I509 Heart failure, unspecified: Secondary | ICD-10-CM | POA: Diagnosis not present

## 2021-05-18 DIAGNOSIS — N186 End stage renal disease: Secondary | ICD-10-CM

## 2021-05-18 DIAGNOSIS — R0602 Shortness of breath: Secondary | ICD-10-CM | POA: Diagnosis not present

## 2021-05-18 LAB — HIV ANTIBODY (ROUTINE TESTING W REFLEX): HIV Screen 4th Generation wRfx: NONREACTIVE

## 2021-05-18 LAB — ECHOCARDIOGRAM COMPLETE
Area-P 1/2: 5.02 cm2
Calc EF: 37.2 %
Height: 69 in
MV M vel: 4.29 m/s
MV Peak grad: 73.6 mmHg
Radius: 0.4 cm
S' Lateral: 6.4 cm
Single Plane A2C EF: 36 %
Single Plane A4C EF: 35.4 %
Weight: 3200 oz

## 2021-05-18 LAB — BASIC METABOLIC PANEL
Anion gap: 14 (ref 5–15)
BUN: 43 mg/dL — ABNORMAL HIGH (ref 8–23)
CO2: 23 mmol/L (ref 22–32)
Calcium: 8.7 mg/dL — ABNORMAL LOW (ref 8.9–10.3)
Chloride: 99 mmol/L (ref 98–111)
Creatinine, Ser: 9.61 mg/dL — ABNORMAL HIGH (ref 0.61–1.24)
GFR, Estimated: 6 mL/min — ABNORMAL LOW (ref >60)
Glucose, Bld: 106 mg/dL — ABNORMAL HIGH (ref 70–99)
Potassium: 3.1 mmol/L — ABNORMAL LOW (ref 3.5–5.1)
Sodium: 136 mmol/L (ref 135–145)

## 2021-05-18 LAB — HEPATITIS B SURFACE ANTIBODY,QUALITATIVE: Hep B S Ab: REACTIVE — AB

## 2021-05-18 LAB — CBC
HCT: 32.5 % — ABNORMAL LOW (ref 39.0–52.0)
Hemoglobin: 9.9 g/dL — ABNORMAL LOW (ref 13.0–17.0)
MCH: 28 pg (ref 26.0–34.0)
MCHC: 30.5 g/dL (ref 30.0–36.0)
MCV: 92.1 fL (ref 80.0–100.0)
Platelets: 182 10*3/uL (ref 150–400)
RBC: 3.53 MIL/uL — ABNORMAL LOW (ref 4.22–5.81)
RDW: 15 % (ref 11.5–15.5)
WBC: 5.1 10*3/uL (ref 4.0–10.5)
nRBC: 0 % (ref 0.0–0.2)

## 2021-05-18 LAB — RESP PANEL BY RT-PCR (FLU A&B, COVID) ARPGX2
Influenza A by PCR: NEGATIVE
Influenza B by PCR: NEGATIVE
SARS Coronavirus 2 by RT PCR: NEGATIVE

## 2021-05-18 LAB — CBG MONITORING, ED: Glucose-Capillary: 124 mg/dL — ABNORMAL HIGH (ref 70–99)

## 2021-05-18 LAB — HEPATITIS B SURFACE ANTIGEN: Hepatitis B Surface Ag: NONREACTIVE

## 2021-05-18 LAB — TROPONIN I (HIGH SENSITIVITY): Troponin I (High Sensitivity): 92 ng/L — ABNORMAL HIGH (ref <18)

## 2021-05-18 MED ORDER — PANTOPRAZOLE SODIUM 40 MG PO TBEC
40.0000 mg | DELAYED_RELEASE_TABLET | Freq: Every day | ORAL | Status: DC
  Administered 2021-05-19: 40 mg via ORAL
  Filled 2021-05-18: qty 1

## 2021-05-18 MED ORDER — CHLORHEXIDINE GLUCONATE CLOTH 2 % EX PADS
6.0000 | MEDICATED_PAD | Freq: Every day | CUTANEOUS | Status: DC
  Administered 2021-05-19 – 2021-05-20 (×2): 6 via TOPICAL

## 2021-05-18 MED ORDER — SODIUM CHLORIDE 0.9 % IV SOLN: 100.0000 mL | INTRAVENOUS | Status: DC | PRN

## 2021-05-18 MED ORDER — NEPRO/CARBSTEADY PO LIQD
237.0000 mL | Freq: Two times a day (BID) | ORAL | Status: DC
  Administered 2021-05-19: 237 mL via ORAL

## 2021-05-18 MED ORDER — PENTAFLUOROPROP-TETRAFLUOROETH EX AERO
1.0000 "application " | INHALATION_SPRAY | CUTANEOUS | Status: DC | PRN
  Filled 2021-05-18: qty 116

## 2021-05-18 MED ORDER — HEPARIN SODIUM (PORCINE) 1000 UNIT/ML DIALYSIS
2000.0000 [IU] | INTRAMUSCULAR | Status: DC | PRN
  Filled 2021-05-18: qty 2

## 2021-05-18 MED ORDER — LIDOCAINE HCL (PF) 1 % IJ SOLN: 5.0000 mL | INTRAMUSCULAR | Status: DC | PRN

## 2021-05-18 MED ORDER — LIDOCAINE-PRILOCAINE 2.5-2.5 % EX CREA
1.0000 "application " | TOPICAL_CREAM | CUTANEOUS | Status: DC | PRN
  Filled 2021-05-18: qty 5

## 2021-05-18 MED ORDER — FAMOTIDINE 20 MG PO TABS
10.0000 mg | ORAL_TABLET | Freq: Every day | ORAL | Status: DC
  Administered 2021-05-18: 10 mg via ORAL
  Filled 2021-05-18: qty 1

## 2021-05-18 NOTE — ED Notes (Signed)
This RN has asked the unit secretary to order the patient an inpatient hospital bed.

## 2021-05-18 NOTE — Progress Notes (Signed)
  Echocardiogram 2D Echocardiogram has been performed.  Charles Hart 05/18/2021, 9:43 AM

## 2021-05-18 NOTE — Procedures (Signed)
   I was present at this dialysis session, have reviewed the session itself and made  appropriate changes Kelly Splinter MD [place] pager [phone]   05/18/2021, 3:55 PM

## 2021-05-18 NOTE — ED Notes (Signed)
Pt taken to HD.  

## 2021-05-18 NOTE — Progress Notes (Signed)
Hemodialysis- Report given to [place] on 3E. Room currently in process of being cleaned. Notified RN patient is on a hospital bed already.

## 2021-05-18 NOTE — ED Notes (Signed)
Pt placed into a hospital bed.

## 2021-05-18 NOTE — Progress Notes (Signed)
Hemodialysis- Treatment completed without issue. Patient did have frequent cramping requiring uf off. Able to UF 2L. Sats remain stable on 2L [place] at 100%. Patient states not much change in breathing. To be transferred to [facility]. Room is being cleaned currently. Will try back.

## 2021-05-18 NOTE — Progress Notes (Signed)
Pt stated he will call if he decides to wear cpap tonight

## 2021-05-18 NOTE — Consult Note (Signed)
[facility] Renal Consultation Note    Indication for Consultation:  Management of ESRD/hemodialysis, anemia, hypertension/volume, and secondary hyperparathyroidism. PCP:  HPI: Charles Hart is a 65 y.o. male with ESRD, T2DM, HTN, HL, and HFrEF who was admitted with acute respiratory failure/volume overload.  Pt reports intermittent dyspnea, especially with exertion, for the past few weeks which worsened on day of presentation to the ED. No CP, diaphoresis, fever, chills. No nausea or vomiting, but endorses some diarrhea intermittently. In the ED, labs showed K 3.3, Ca 9, BNP > 4500, Trop 60 -> 85 -> 92, WBC 5.4, Hgb 11. CXR showed ^ vascular congestion. EKG without acute ST changes. Echo this morning showed EF 30-35%, biatrial dilation.  Dialyzes on TTS schedule at [facility] clinic. Has been attending all his recent treatments and no recent issues.  Past Medical History:  Diagnosis Date   Adenomatous colon polyp 07/02/2011   Last colonoscopy May 06, 2011 by Dr. Owens Loffler, who recommended repeat colonoscopy in 5 years.    Anemia    Background diabetic retinopathy 04/20/2012   Patient is followed by Dr. Katy Fitch    Cancer [name])    Kidney   Cardiomyopathy    LV function improved from 2004 to 2008.  Historically, moderately dilated LV with EF 30-40% by 2D echo 08/14/2002.  Mild CAD with severe LV dysfunction by cardiac cath 09/2002.  Normal coronary arteries and normal LV function by cardiac cath 09/19/2006.  A 2-D echo on 04/01/2009 showed mild concentric hypertrophy and normal systolic (LVEF  85-27%) and doppler C/W with grade 1 diastolic dysfunction.   CHF (congestive heart failure) (HCC)    LV function improved from 2004 to 2008.  Historically, moderately dilated LV with EF 30-40% by 2D echo 08/14/2002.  Mild CAD with severe LV dysfunction by cardiac cath 09/2002.  Normal coronary arteries and normal LV function by cardiac cath 09/19/2006.  A 2-D echo on  04/01/2009 showed mild concentric hypertrophy and normal systolic (LVEF  78-24%) and doppler C/W with grade 1 diastolic dysfunction..    Chronic combined systolic and diastolic congestive heart failure ([place]) 05/21/2010   LV function improved from 2004 to 2008.  Historically, moderately dilated LV with EF 30-40% by 2D echo 08/14/2002.  Mild CAD with severe LV dysfunction by cardiac cath 09/2002.  Normal coronary arteries and normal LV function by cardiac cath 09/19/2006.  A 2-D echo on 04/01/2009 showed mild concentric hypertrophy and normal systolic (LVEF  23-53%) and doppler parameters consistent with abnormal left    CVA (cerebral vascular accident) ([place]) 07/04/2012   MRI of the brain 07/04/2012 showed an acute infarct in the right basal ganglia involving the anterior putamen, anterior limb internal capsule, and head of the caudate; this measured approximately 2.5 cm in diameter.      Dermatitis    Diabetes mellitus    type 2   DIABETIC PERIPHERAL NEUROPATHY 08/03/2007   Qualifier: Diagnosis of  By: Marinda Elk MD, Sonia Side     DM neuropathy, painful ([place])    fingers and right knee   ESRD (end stage renal disease) ([place])    Stage 4, on hemodailysis x 4 months as of 05-18-18 [facility], tues thurs sat   ESRD (end stage renal disease) on dialysis ([place])    "TTS; [facility]" (05/31/2018)   Hearing loss in right ear    Hyperlipidemia    Hypertension    Hypertensive crisis 07/28/2012   Hypertensive urgency 08/20/2014   Myocardial infarction [facility]  many yrs ago  Nephrotic syndrome 02/18/2013   A 24-hour urine collection 03/04/2013 showed total protein of 5,460 g and creatinine clearance of 80 mL/minute.  Patient was seen by Seward Meth at [place] and a repeat 24-hour urine showed 10,407 mg protein.  Patient underwent kidney biopsy on 05/30/2013; pathology showed advanced diffuse and nodular diabetic nephropathy with vascular changes consistent with long-standing difficult  to control hypertension.      Pneumonia 2014 and 2015   Renal insufficiency    Sleep apnea    11/06/20 - hasn't used it "in a while"   Past Surgical History:  Procedure Laterality Date   AV FISTULA PLACEMENT Left 11/24/2017   Procedure: INSERTION OF ARTERIOVENOUS (AV) GORE-TEX GRAFT LEFT LOWER ARM;  Surgeon: Rosetta Posner, MD;  Location: [facility];  Service: Vascular;  Laterality: Left;   CARDIAC CATHETERIZATION      4 times   COLONOSCOPY     COLONOSCOPY WITH PROPOFOL N/A 06/18/2020   Procedure: COLONOSCOPY WITH PROPOFOL;  Surgeon: Milus Banister, MD;  Location: [facility];  Service: Endoscopy;  Laterality: N/A;   FOOT SURGERY     INCISIONAL HERNIA REPAIR N/A 11/09/2020   Procedure: OPEN INCISIONAL HERNIA REPAIR WITH MESH;  Surgeon: Ralene Ok, MD;  Location: [place];  Service: General;  Laterality: N/A;   INSERTION OF MESH N/A 11/09/2020   Procedure: INSERTION OF MESH;  Surgeon: Ralene Ok, MD;  Location[facility] OR;  Service: General;  Laterality: N/A;   POLYPECTOMY     POLYPECTOMY  06/18/2020   Procedure: POLYPECTOMY;  Surgeon: Milus Banister, MD;  Location: [facility];  Service: Endoscopy;;   ROBOT ASSISTED LAPAROSCOPIC NEPHRECTOMY Left 05/30/2018   Procedure: XI ROBOTIC ASSISTED LAPAROSCOPIC NEPHRECTOMY;  Surgeon: Alexis Frock, MD;  Location: [facility];  Service: Urology;  Laterality: Left;   Family History  Problem Relation Age of Onset   Aneurysm Father 29       died of rupture   Colon cancer Sister    Social History:  reports that he has never smoked. He has never used smokeless tobacco. He reports that he does not drink alcohol and does not use drugs.  ROS: As per HPI otherwise negative.  Physical Exam: Vitals:   05/18/21 1000 05/18/21 1030 05/18/21 1100 05/18/21 1142  BP: 132/62 137/67 137/78 134/84  Pulse: 88 88 86 85  Resp: (!) 30 (!) 30 (!) 31 (!) 32  Temp:      TempSrc:      SpO2: 100% 100% 100% 100%  Weight:    91 kg  Height:         General: Well  developed, well nourished, in no acute distress. Room air. Head: Normocephalic, atraumatic, sclera non-icteric, mucus membranes are moist. Neck: Supple without lymphadenopathy/masses. JVD not elevated. Lungs: Clear bilaterally to auscultation without wheezes, rales, or rhonchi. Breathing is unlabored. Heart: RRR with normal S1, S2. No murmurs, rubs, or gallops appreciated. Abdomen: Soft, non-tender, non-distended with normoactive bowel sounds. No rebound/guarding. No obvious abdominal masses. Musculoskeletal:  Strength and tone appear normal for age. Lower extremities: 1+ BLE edema, no clear wounds Neuro: Alert and oriented X 3. Moves all extremities spontaneously. Psych:  Responds to questions appropriately with a normal affect. Dialysis Access: L AVG + bruit  Allergies  Allergen Reactions   Ivp Dye [Iodinated Diagnostic Agents] Other (See Comments)    Per patient's Nephrologist, he doesn't want the patient exposed to ANY dye because of issues with his kidneys   Hydrocodone  Caused hands and feet to peel    Phentermine Swelling   Amlodipine Swelling   Lisinopril Cough   Red Dye Other (See Comments)    NO dye of any kind (issues with his kidneys)   Prior to Admission medications   Medication Sig Start Date End Date Taking? Authorizing Provider  acetaminophen (TYLENOL) 500 MG tablet Take 1,000 mg by mouth every 6 (six) hours as needed.   Yes [provider]  calcium acetate (PHOSLO) 667 MG capsule Take 2,001 mg by mouth 3 (three) times daily with meals.  12/15/17  Yes [place], Caren Griffins, MD  carvedilol (COREG) 25 MG tablet Take 1 tablet (25 mg total) by mouth 2 (two) times daily with a meal. 09/27/18  Yes Lucious Groves, DO  famotidine (PEPCID) 20 MG tablet One after supper Patient taking differently: Take 20 mg by mouth daily after supper. 03/26/21  Yes Tanda Rockers, MD  fluticasone (FLONASE) 50 MCG/ACT nasal spray Place 2 sprays into both nostrils as needed for allergies.  09/27/18  Yes Lucious Groves, DO  folic acid-vitamin b complex-vitamin c-selenium-zinc (DIALYVITE) 3 MG TABS tablet Take 1 tablet by mouth daily.   Yes [provider]  furosemide (LASIX) 80 MG tablet Take 1 tablet (80 mg total) by mouth 2 (two) times daily. 01/28/20  Yes Lucious Groves, DO  gabapentin (NEURONTIN) 300 MG capsule Take 300 mg by mouth at bedtime.   Yes [provider]  hydrALAZINE (APRESOLINE) 100 MG tablet TAKE 1 TABLET(100 MG) BY MOUTH THREE TIMES DAILY Patient taking differently: Take 100 mg by mouth 3 (three) times daily. TAKE 1 TABLET(100 MG) BY MOUTH THREE TIMES DAILY 12/03/20  Yes Lucious Groves, DO  meclizine (ANTIVERT) 12.5 MG tablet Take 1 tablet (12.5 mg total) by mouth 3 (three) times daily as needed for dizziness. 12/12/20  Yes Long, Wonda Olds, MD  pantoprazole (PROTONIX) 40 MG tablet Take 1 tablet (40 mg total) by mouth daily. 03/26/21  Yes Tanda Rockers, MD  pravastatin (PRAVACHOL) 40 MG tablet Take 1 tablet (40 mg total) by mouth daily. Patient taking differently: Take 40 mg by mouth at bedtime. 01/28/20  Yes Lucious Groves, DO  sildenafil (VIAGRA) 50 MG tablet Take 1 tablet (50 mg total) by mouth as needed for erectile dysfunction. Patient taking differently: Take 50 mg by mouth daily as needed for erectile dysfunction. 11/01/19 05/17/21 Yes Lucious Groves, DO  SUPER B COMPLEX/C PO Take 1 tablet by mouth daily.   Yes [provider]  acetaminophen (TYLENOL) 325 MG tablet Take 2 tablets (650 mg total) by mouth every 6 (six) hours as needed for mild pain. Patient not taking: Reported on 05/17/2021 11/18/20   Norm Parcel, PA-C   Current Facility-Administered Medications  Medication Dose Route Frequency Provider Last Rate Last Admin   0.9 %  sodium chloride infusion  100 mL Intravenous PRN Loren Racer, PA-C       0.9 %  sodium chloride infusion  100 mL Intravenous PRN Loren Racer, PA-C       Chlorhexidine Gluconate Cloth 2 %  PADS 6 each  6 each Topical Q0600 Loren Racer, PA-C       heparin injection 2,000 Units  2,000 Units Dialysis PRN Loren Racer, PA-C       heparin injection 5,000 Units  5,000 Units Subcutaneous Q8H Gaylan Gerold, DO   5,000 Units at 05/18/21 0537   hydrALAZINE (APRESOLINE) tablet 100 mg  100 mg  Oral Q8H Gaylan Gerold, DO   100 mg at 05/18/21 0537   lidocaine (PF) (XYLOCAINE) 1 % injection 5 mL  5 mL Intradermal PRN Loren Racer, PA-C       lidocaine-prilocaine (EMLA) cream 1 application  1 application Topical PRN Loren Racer, PA-C       pentafluoroprop-tetrafluoroeth (GEBAUERS) aerosol 1 application  1 application Topical PRN Loren Racer, PA-C       pravastatin (PRAVACHOL) tablet 40 mg  40 mg Oral Daily Gaylan Gerold, DO   40 mg at 05/18/21 1012   Current Outpatient Medications  Medication Sig Dispense Refill   acetaminophen (TYLENOL) 500 MG tablet Take 1,000 mg by mouth every 6 (six) hours as needed.     calcium acetate (PHOSLO) 667 MG capsule Take 2,001 mg by mouth 3 (three) times daily with meals.      carvedilol (COREG) 25 MG tablet Take 1 tablet (25 mg total) by mouth 2 (two) times daily with a meal. 180 tablet 1   famotidine (PEPCID) 20 MG tablet One after supper (Patient taking differently: Take 20 mg by mouth daily after supper.) 30 tablet 11   fluticasone (FLONASE) 50 MCG/ACT nasal spray Place 2 sprays into both nostrils as needed for allergies. 16 g 1   folic acid-vitamin b complex-vitamin c-selenium-zinc (DIALYVITE) 3 MG TABS tablet Take 1 tablet by mouth daily.     furosemide (LASIX) 80 MG tablet Take 1 tablet (80 mg total) by mouth 2 (two) times daily. 180 tablet 1   gabapentin (NEURONTIN) 300 MG capsule Take 300 mg by mouth at bedtime.     hydrALAZINE (APRESOLINE) 100 MG tablet TAKE 1 TABLET(100 MG) BY MOUTH THREE TIMES DAILY (Patient taking differently: Take 100 mg by mouth 3 (three) times daily. TAKE 1 TABLET(100 MG) BY MOUTH THREE TIMES DAILY) 270  tablet 0   meclizine (ANTIVERT) 12.5 MG tablet Take 1 tablet (12.5 mg total) by mouth 3 (three) times daily as needed for dizziness. 30 tablet 0   pantoprazole (PROTONIX) 40 MG tablet Take 1 tablet (40 mg total) by mouth daily. 30 tablet 22   pravastatin (PRAVACHOL) 40 MG tablet Take 1 tablet (40 mg total) by mouth daily. (Patient taking differently: Take 40 mg by mouth at bedtime.) 90 tablet 1   sildenafil (VIAGRA) 50 MG tablet Take 1 tablet (50 mg total) by mouth as needed for erectile dysfunction. (Patient taking differently: Take 50 mg by mouth daily as needed for erectile dysfunction.) 10 tablet 2   SUPER B COMPLEX/C PO Take 1 tablet by mouth daily.     acetaminophen (TYLENOL) 325 MG tablet Take 2 tablets (650 mg total) by mouth every 6 (six) hours as needed for mild pain. (Patient not taking: Reported on 05/17/2021)     Labs: Basic Metabolic Panel: Recent Labs  Lab 05/17/21 1442 05/18/21 0425  NA 137 136  K 3.3* 3.1*  CL 99 99  CO2 24 23  GLUCOSE 138* 106*  BUN 37* 43*  CREATININE 8.73* 9.61*  CALCIUM 9.0 8.7*   Liver Function Tests: Recent Labs  Lab 05/17/21 1442  AST 15  ALT 27  ALKPHOS 46  BILITOT 1.3*  PROT 6.6  ALBUMIN 3.6   CBC: Recent Labs  Lab 05/17/21 1442 05/18/21 0425  WBC 5.4 5.1  NEUTROABS 3.9  --   HGB 11.0* 9.9*  HCT 35.1* 32.5*  MCV 90.9 92.1  PLT 188 182   CBG: Recent Labs  Lab 05/18/21 0737  GLUCAP 124*  Studies/Results: DG Chest 2 View  Result Date: 05/17/2021 CLINICAL DATA:  Shortness of breath with cough. EXAM: CHEST - 2 VIEW COMPARISON:  Chest x-ray 03/15/2021. FINDINGS: Heart is enlarged, unchanged. There is central pulmonary vascular congestion. The lungs are clear. There is no pleural effusion or pneumothorax. No acute fractures are seen. There is a healed right seventh rib fracture. IMPRESSION: 1. Cardiomegaly with central pulmonary vascular congestion. Electronically Signed   By: Ronney Asters M.D.   On: 05/17/2021 16:04    ECHOCARDIOGRAM COMPLETE  Result Date: 05/18/2021    ECHOCARDIOGRAM REPORT   Patient Name:   Charles Hart Date of Exam: 05/18/2021 Medical Rec #:  [id number]         Height:       69.0 in Accession #:    [id number]        Weight:       200.0 lb Date of Birth:  [birth year]         BSA:          2.066 m Patient Age:    68 years          BP:           161/97 mmHg Patient Gender: M                 HR:           91 bpm. Exam Location:  Inpatient Procedure: 3D Echo, 2D Echo, Cardiac Doppler and Color Doppler Indications:    R06.02 SOB  History:        Patient has prior history of Echocardiogram examinations, most                 recent 08/13/2020. CHF, Signs/Symptoms:Dyspnea; Risk                 Factors:Diabetes, Sleep Apnea and Hypertension. ESRD.  Sonographer:    Roseanna Rainbow RDCS Referring Phys: [place]  Sonographer Comments: Technically difficult study due to poor echo windows. Image acquisition challenging due to uncooperative patient. Patient could not be still during echo IMPRESSIONS  1. EF similar to echo done 08/13/20 . Left ventricular ejection fraction, by estimation, is 30 to 35%. The left ventricle has severely decreased function. The left ventricle demonstrates global hypokinesis. The left ventricular internal cavity size was severely dilated. Left ventricular diastolic parameters were normal.  2. Right ventricular systolic function is mildly reduced. The right ventricular size is mildly enlarged.  3. Left atrial size was moderately dilated.  4. Right atrial size was moderately dilated.  5. The mitral valve is abnormal. Mild to moderate mitral valve regurgitation. No evidence of mitral stenosis.  6. The aortic valve is tricuspid. Aortic valve regurgitation is trivial. Mild aortic valve sclerosis is present, with no evidence of aortic valve stenosis.  7. Aortic dilatation noted. There is mild dilatation of the ascending aorta, measuring 41 mm.  8. The inferior vena cava is normal in size  with greater than 50% respiratory variability, suggesting right atrial pressure of 3 mmHg. FINDINGS  Left Ventricle: EF similar to echo done 08/13/20. Left ventricular ejection fraction, by estimation, is 30 to 35%. The left ventricle has severely decreased function. The left ventricle demonstrates global hypokinesis. The left ventricular internal cavity size was severely dilated. There is no left ventricular hypertrophy. Left ventricular diastolic parameters were normal. Right Ventricle: The right ventricular size is mildly enlarged. Right vetricular wall thickness was not assessed. Right ventricular systolic function is mildly reduced.  Left Atrium: Left atrial size was moderately dilated. Right Atrium: Right atrial size was moderately dilated. Pericardium: There is no evidence of pericardial effusion. Mitral Valve: The mitral valve is abnormal. There is mild thickening of the mitral valve leaflet(s). There is mild calcification of the mitral valve leaflet(s). Mild to moderate mitral valve regurgitation. No evidence of mitral valve stenosis. Tricuspid Valve: The tricuspid valve is normal in structure. Tricuspid valve regurgitation is not demonstrated. No evidence of tricuspid stenosis. Aortic Valve: The aortic valve is tricuspid. Aortic valve regurgitation is trivial. Mild aortic valve sclerosis is present, with no evidence of aortic valve stenosis. Pulmonic Valve: The pulmonic valve was normal in structure. Pulmonic valve regurgitation is not visualized. No evidence of pulmonic stenosis. Aorta: Aortic dilatation noted. There is mild dilatation of the ascending aorta, measuring 41 mm. Venous: The inferior vena cava is normal in size with greater than 50% respiratory variability, suggesting right atrial pressure of 3 mmHg. IAS/Shunts: No atrial level shunt detected by color flow Doppler.  LEFT VENTRICLE PLAX 2D LVIDd:         7.50 cm      Diastology LVIDs:         6.40 cm      LV e' medial:    6.85 cm/s LV PW:          1.30 cm      LV E/e' medial:  14.1 LV IVS:        1.10 cm      LV e' lateral:   9.25 cm/s LVOT diam:     2.40 cm      LV E/e' lateral: 10.4 LV SV:         96 LV SV Index:   46 LVOT Area:     4.52 cm  LV Volumes (MOD) LV vol d, MOD A2C: 228.0 ml LV vol d, MOD A4C: 198.0 ml LV vol s, MOD A2C: 146.0 ml LV vol s, MOD A4C: 128.0 ml LV SV MOD A2C:     82.0 ml LV SV MOD A4C:     198.0 ml LV SV MOD BP:      81.2 ml RIGHT VENTRICLE            IVC RV S prime:     8.59 cm/s  IVC diam: 2.40 cm TAPSE (M-mode): 1.6 cm LEFT ATRIUM             Index       RIGHT ATRIUM           Index LA diam:        3.70 cm 1.79 cm/m  RA Area:     26.90 cm LA Vol (A2C):   62.2 ml 30.11 ml/m RA Volume:   107.00 ml 51.79 ml/m LA Vol (A4C):   48.7 ml 23.57 ml/m LA Biplane Vol: 54.3 ml 26.28 ml/m  AORTIC VALVE LVOT Vmax:   130.00 cm/s LVOT Vmean:  80.700 cm/s LVOT VTI:    0.212 m  AORTA Ao Root diam: 4.20 cm Ao Asc diam:  3.95 cm MITRAL VALVE MV Area (PHT): 5.02 cm      SHUNTS MV Decel Time: 151 msec      Systemic VTI:  0.21 m MR Peak grad:    73.6 mmHg   Systemic Diam: 2.40 cm MR Mean grad:    47.0 mmHg MR Vmax:         429.00 cm/s MR Vmean:        327.0 cm/s MR PISA:  1.01 cm MR PISA Eff ROA: 9 mm MR PISA Radius:  0.40 cm MV E velocity: 96.40 cm/s MV A velocity: 65.00 cm/s MV E/A ratio:  1.48 [name] MD Electronically signed by [name] MD Signature Date/Time: 05/18/2021/9:54:09 AM    Final     Dialysis Orders:  TTS at [facility], 400/500, EDW 89.4kg, 2K/2.5Ca, AVG, heparin 3000 - Mircera 29mcg IV q 4 weeks (last 10/1) - Calcitriol 1.64mcg PO q HD  Assessment/Plan:  CHF exacerbation/volume overload: For HD with 4L UFG today - follow symptoms after.  Elevated troponin: Per primary, ?demand ischemia.  ESRD:  Continue HD per usual TTS schedule - HD today, 4K bath for hypokalemia.  Hypertension/volume: BP stable, but with edema on exam - 4L UFG.  Anemia: Hgb 9.9 - not yet due for ESA.  Metabolic bone disease:  Ca 8.7, Phos pending. Continue home binders.  HFrEF  Veneta Penton, PA-C 05/18/2021, 11:49 AM  [employer]

## 2021-05-18 NOTE — Progress Notes (Signed)
Charles Hart is a 65 y.o. with PMH of combined heart failure, ESRD on HD, htn, OSA, and normocytic anemia admit for shortness of breath on hospital day 0. Subjective:   Overnight: No acute events overnight.   Patient was in dialysis when evaluated. At earlier evaluation attempt, he was getting an echo. State he was having leg cramps while getting dialysis which has become common recently. Denied any other acute compliant. Endorsed increasing anxiety recently. Stated his breathing has been bothering him for 2 months now. Has not been able to use his CPAP for 6 months due to increased pressures. States has another sleep study ordered.   Objective:  Vital signs in last 24 hours: Vitals:   05/18/21 0500 05/18/21 0537 05/18/21 0700 05/18/21 0730  BP: (!) 165/99 (!) 165/99 129/79 140/73  Pulse: 100  93 95  Resp: (!) 25  (!) 21 (!) 34  Temp:      TempSrc:      SpO2: 99%  100% 100%  Weight:      Height:       Physical Exam Constitutional:      General: He is not in acute distress. HENT:     Head: Normocephalic and atraumatic.     Mouth/Throat:     Mouth: Mucous membranes are moist.     Pharynx: Oropharynx is clear.  Neck:     Vascular: JVD present.  Cardiovascular:     Rate and Rhythm: Normal rate and regular rhythm.     Pulses: Normal pulses.  Pulmonary:     Effort: No accessory muscle usage or respiratory distress.     Breath sounds: Examination of the right-lower field reveals rales. Examination of the left-lower field reveals rales. Rales present. No wheezing or rhonchi.  Chest:     Chest wall: No tenderness.  Abdominal:     General: Bowel sounds are normal. There is no distension.     Palpations: Abdomen is soft.  Musculoskeletal:        General: No tenderness. Normal range of motion.     Right lower leg: Edema (2+) present.     Left lower leg: Edema (2+) present.  Skin:    General: Skin is warm and dry.     Capillary Refill: Capillary refill takes less than 2  seconds.     Findings: No lesion.  Neurological:     General: No focal deficit present.     Mental Status: He is alert and oriented to person, place, and time.  Psychiatric:        Mood and Affect: Mood normal.        Behavior: Behavior normal.     Assessment/Plan:  Active Problems:   Acute exacerbation of CHF (congestive heart failure) ([place])  Charles Hart is a 65 y.o. with PMH of combined heart failure, ESRD on HD, htn, OSA, and normocytic anemia admit for shortness of breath on hospital day 0.   #Symptomatic volume overload with elevated blood pressure  Acute on chronic combined systolic diastolic congestive heart failure -Cardiology consulted  -Echo repeated  -EF unchanged from previously with mildly reduced function.  -Nephrology consulted  -They saw patient and made adjustments to HD.  -Continue to hold coreg  ESRD with symptomatic volume overload  -Nephrology consulted  -They saw patient and made adjustments to HD.  -Will assess patient s/p HD -Will assess volume status and titrate lasix as needed -Continue to follow nephrology requirements  Primary hypertension Patient has htn and came in  with elevated bp. He was normotensive this am. We will continue hydralazine but hold metoprolol.  -Continue Lasix 80 mg BID. Add additional doses as needed based on volume status. -Continue 100 mg TID  -Hold Coreg 25 mg BID  Hypokalemia  Patient has hypokalemia. He has known ESRD on HD and is taking lasix 80 mg BID.  -Supplement with HD -Continue to monitor  OSA Patient has hx of OSA but stated has not used CPAP  -Will consult RT for CPAP while pt is here.   DVT prophx: Heparin Diet:Heart/renal Bowel:PRN Code:Full  Prior to Admission Living Arrangement:Home Anticipated Discharge Location:TBD Barriers to Discharge:Medical workup Dispo: Anticipated discharge in approximately 2-3 day(s).   Charles Schuller, MD 05/18/2021, 8:08 AM Pager: [phone] After 5pm on  weekdays and 1pm on weekends:on call pager

## 2021-05-19 DIAGNOSIS — Z7682 Awaiting organ transplant status: Secondary | ICD-10-CM | POA: Diagnosis not present

## 2021-05-19 DIAGNOSIS — R0602 Shortness of breath: Secondary | ICD-10-CM | POA: Diagnosis present

## 2021-05-19 DIAGNOSIS — I429 Cardiomyopathy, unspecified: Secondary | ICD-10-CM | POA: Diagnosis present

## 2021-05-19 DIAGNOSIS — D631 Anemia in chronic kidney disease: Secondary | ICD-10-CM | POA: Diagnosis present

## 2021-05-19 DIAGNOSIS — H9191 Unspecified hearing loss, right ear: Secondary | ICD-10-CM | POA: Diagnosis present

## 2021-05-19 DIAGNOSIS — Z87441 Personal history of nephrotic syndrome: Secondary | ICD-10-CM | POA: Diagnosis not present

## 2021-05-19 DIAGNOSIS — Z79899 Other long term (current) drug therapy: Secondary | ICD-10-CM | POA: Diagnosis not present

## 2021-05-19 DIAGNOSIS — Z8673 Personal history of transient ischemic attack (TIA), and cerebral infarction without residual deficits: Secondary | ICD-10-CM | POA: Diagnosis not present

## 2021-05-19 DIAGNOSIS — E1142 Type 2 diabetes mellitus with diabetic polyneuropathy: Secondary | ICD-10-CM | POA: Diagnosis present

## 2021-05-19 DIAGNOSIS — N186 End stage renal disease: Secondary | ICD-10-CM | POA: Diagnosis present

## 2021-05-19 DIAGNOSIS — I132 Hypertensive heart and chronic kidney disease with heart failure and with stage 5 chronic kidney disease, or end stage renal disease: Secondary | ICD-10-CM | POA: Diagnosis present

## 2021-05-19 DIAGNOSIS — E11319 Type 2 diabetes mellitus with unspecified diabetic retinopathy without macular edema: Secondary | ICD-10-CM | POA: Diagnosis present

## 2021-05-19 DIAGNOSIS — E876 Hypokalemia: Secondary | ICD-10-CM | POA: Diagnosis present

## 2021-05-19 DIAGNOSIS — Z992 Dependence on renal dialysis: Secondary | ICD-10-CM | POA: Diagnosis not present

## 2021-05-19 DIAGNOSIS — I2510 Atherosclerotic heart disease of native coronary artery without angina pectoris: Secondary | ICD-10-CM | POA: Diagnosis present

## 2021-05-19 DIAGNOSIS — E785 Hyperlipidemia, unspecified: Secondary | ICD-10-CM | POA: Diagnosis present

## 2021-05-19 DIAGNOSIS — I252 Old myocardial infarction: Secondary | ICD-10-CM | POA: Diagnosis not present

## 2021-05-19 DIAGNOSIS — N2581 Secondary hyperparathyroidism of renal origin: Secondary | ICD-10-CM | POA: Diagnosis present

## 2021-05-19 DIAGNOSIS — Z905 Acquired absence of kidney: Secondary | ICD-10-CM | POA: Diagnosis not present

## 2021-05-19 DIAGNOSIS — Z20822 Contact with and (suspected) exposure to covid-19: Secondary | ICD-10-CM | POA: Diagnosis present

## 2021-05-19 DIAGNOSIS — Z800 Family history of malignant neoplasm of digestive organs: Secondary | ICD-10-CM | POA: Diagnosis not present

## 2021-05-19 DIAGNOSIS — F419 Anxiety disorder, unspecified: Secondary | ICD-10-CM | POA: Diagnosis present

## 2021-05-19 DIAGNOSIS — I5042 Chronic combined systolic (congestive) and diastolic (congestive) heart failure: Secondary | ICD-10-CM | POA: Diagnosis present

## 2021-05-19 DIAGNOSIS — G4733 Obstructive sleep apnea (adult) (pediatric): Secondary | ICD-10-CM | POA: Diagnosis present

## 2021-05-19 DIAGNOSIS — E1122 Type 2 diabetes mellitus with diabetic chronic kidney disease: Secondary | ICD-10-CM | POA: Diagnosis present

## 2021-05-19 LAB — BASIC METABOLIC PANEL
Anion gap: 10 (ref 5–15)
BUN: 21 mg/dL (ref 8–23)
CO2: 28 mmol/L (ref 22–32)
Calcium: 8.4 mg/dL — ABNORMAL LOW (ref 8.9–10.3)
Chloride: 98 mmol/L (ref 98–111)
Creatinine, Ser: 6.47 mg/dL — ABNORMAL HIGH (ref 0.61–1.24)
GFR, Estimated: 9 mL/min — ABNORMAL LOW (ref >60)
Glucose, Bld: 86 mg/dL (ref 70–99)
Potassium: 3.3 mmol/L — ABNORMAL LOW (ref 3.5–5.1)
Sodium: 136 mmol/L (ref 135–145)

## 2021-05-19 LAB — MAGNESIUM: Magnesium: 1.9 mg/dL (ref 1.7–2.4)

## 2021-05-19 LAB — GLUCOSE, CAPILLARY
Glucose-Capillary: 67 mg/dL — ABNORMAL LOW (ref 70–99)
Glucose-Capillary: 80 mg/dL (ref 70–99)
Glucose-Capillary: 127 mg/dL — ABNORMAL HIGH (ref 70–99)
Glucose-Capillary: 145 mg/dL — ABNORMAL HIGH (ref 70–99)

## 2021-05-19 LAB — HEPATITIS B SURFACE ANTIBODY, QUANTITATIVE: Hep B S AB Quant (Post): 35.7 m[IU]/mL (ref >9.9)

## 2021-05-19 LAB — PHOSPHORUS: Phosphorus: 3.1 mg/dL (ref 2.5–4.6)

## 2021-05-19 LAB — POTASSIUM: Potassium: 3.6 mmol/L (ref 3.5–5.1)

## 2021-05-19 MED ORDER — CARVEDILOL 25 MG PO TABS
25.0000 mg | ORAL_TABLET | Freq: Two times a day (BID) | ORAL | Status: DC
  Administered 2021-05-19 – 2021-05-20 (×2): 25 mg via ORAL
  Filled 2021-05-19 (×2): qty 1

## 2021-05-19 MED ORDER — RENA-VITE PO TABS
1.0000 | ORAL_TABLET | Freq: Every day | ORAL | Status: DC
  Administered 2021-05-19: 1 via ORAL
  Filled 2021-05-19: qty 1

## 2021-05-19 MED ORDER — BISMUTH SUBSALICYLATE 262 MG PO CHEW
524.0000 mg | CHEWABLE_TABLET | ORAL | Status: DC | PRN
  Administered 2021-05-19 – 2021-05-20 (×2): 524 mg via ORAL
  Filled 2021-05-19 (×3): qty 2

## 2021-05-19 MED ORDER — FUROSEMIDE 40 MG PO TABS
80.0000 mg | ORAL_TABLET | Freq: Two times a day (BID) | ORAL | Status: DC
  Administered 2021-05-19 – 2021-05-20 (×3): 80 mg via ORAL
  Filled 2021-05-19 (×3): qty 2

## 2021-05-19 MED ORDER — FUROSEMIDE 10 MG/ML IJ SOLN: 80.0000 mg | Freq: Two times a day (BID) | INTRAMUSCULAR | Status: DC

## 2021-05-19 NOTE — Care Management Obs Status (Signed)
[place] NOTIFICATION   Patient Details  Name: Charles Hart MRN: [id number] Date of Birth: 1956/07/04   Medicare Observation Status Notification Given:  Yes    Carles Collet, RN 05/19/2021, 8:48 AM

## 2021-05-19 NOTE — Progress Notes (Signed)
Charles Hart is a 65 y.o. with PMH of combined heart failure, ESRD on HD, htn, OSA, and normocytic anemia admit for shortness of breath on hospital day 1. Subjective:  Overnight: No acute events overnight. Patient tried CPAP but wasn't able to use it.    Charles Hart reports he is feeling better today, particularly his breathing and cough. He is resting better.   Objective:  Vital signs in last 24 hours: Vitals:   05/18/21 1519 05/18/21 1939 05/19/21 0010 05/19/21 0351  BP: (!) 140/95 125/64 124/70 131/77  Pulse: 93 94 94 94  Resp: 20 20 20 18   Temp: 97.7 F (36.5 C) 99.1 F (37.3 C) 98.5 F (36.9 C) 97.9 F (36.6 C)  TempSrc: Oral Oral Oral Oral  SpO2: 100% 97% 98% 98%  Weight: 89 kg  89.8 kg   Height:        Physical Exam Constitutional:      General: He is not in acute distress. HENT:     Head: Normocephalic and atraumatic.     Mouth/Throat:     Mouth: Mucous membranes are moist.     Pharynx: Oropharynx is clear.  Neck:     Vascular: JVD present.  Cardiovascular:     Rate and Rhythm: Normal rate and regular rhythm.     Pulses: Normal pulses.  Pulmonary:     Effort: No accessory muscle usage or respiratory distress.     Breath sounds: Examination of the right-lower field reveals rales. Examination of the left-lower field reveals rales.  No wheezing or rhonchi.  Chest:     Chest wall: No tenderness.  Abdominal:     General: Bowel sounds are normal. There is no distension.     Palpations: Abdomen is soft.  Musculoskeletal:        General: No tenderness. Normal range of motion.     Right lower leg: Edema (1+) present.     Left lower leg: Edema (1+) present.  Skin:    General: Skin is warm and dry.     Capillary Refill: Capillary refill takes less than 2 seconds.     Findings: No lesion.  Neurological:     General: No focal deficit present.     Mental Status: He is alert and oriented to person, place, and time.  Psychiatric:        Mood and Affect: Mood  normal.        Behavior: Behavior normal.   Assessment/Plan:  Active Problems:   Acute exacerbation of CHF (congestive heart failure) (Charles Hart)  Charles Hart is a 65 y.o. with PMH of combined heart failure, ESRD on HD, htn, OSA, and normocytic anemia admit for shortness of breath on hospital day 1.   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  Only 2 L UF was removed with plans to remove 4 L. Will receive dialysis tomorrow.  -Restart coreg 25 mg BID, continue hydralazine 100 mg TID -Patient not taking Viagra anymore so we will consider BiDil for his CHF.   ESRD with symptomatic volume overload  -Endorsed improvement post dialysis -Nephrology consulted  -They saw patient and made adjustments to HD.  Only 2 L UF was removed with plans to remove 4 L. Will receive dialysis tomorrow.  -Continue Rena-Vit -Continue to follow nephrology recs  Primary hypertension (chronic) Patient has htn and came in with elevated bp. He was normotensive this am.  -Restart Lasix 80 mg BID. Add additional doses as needed based on volume status. Pt normotensive since HD.   -Continue Hydralazine 100 mg TID  -Restart Coreg 25 mg BID  Hypokalemia  Patient has hypokalemia. He has known ESRD on HD and is taking lasix 80 mg BID. Repeat K was 3.6 s/p HD and 3.3 10/05 am. -Supplement with HD -Continue to monitor  OSA Patient has hx of OSA but stated has not used CPAP  -RT gave pt CPAP but he was not able to wear it.  -He states he was told he needed another sleep study.   DVT prophx: Heparin Diet:Heart/renal Bowel:PRN Code:Full  Prior to Admission Living Arrangement:Home Anticipated Discharge Location:Home Barriers to Discharge:Medical workup Dispo: Anticipated discharge in  approximately 1 day(s).   Charles Schuller, MD 05/19/2021, 5:26 AM Pager: [phone] After 5pm on weekdays and 1pm on weekends:on call pager

## 2021-05-19 NOTE — Plan of Care (Signed)
  Problem: Clinical Measurements: Goal: Respiratory complications will improve Outcome: Progressing   

## 2021-05-19 NOTE — Progress Notes (Signed)
Mobility Specialist Progress Note    05/19/21 1417  Mobility  Activity Ambulated in hall  Level of Assistance Modified independent, requires aide device or extra time  Assistive Device [facility] Ambulated (ft) 490 ft  Mobility Ambulated with assistance in hallway  Mobility Response Tolerated well  Mobility performed by Mobility specialist  $Mobility charge 1 Mobility   During Mobility: 103 HR  Pt received in bed and agreeable. Felt some SOB after turning around in hallway. Maintained SpO2 of 95-96%. Returned to bed with call bell in reach.   Hildred Alamin Mobility Specialist  Mobility Specialist Phone: [phone]

## 2021-05-19 NOTE — Progress Notes (Signed)
[facility] Progress Note  Subjective: seen in room, no c/o today  Vitals:   05/18/21 1939 05/19/21 0010 05/19/21 0351 05/19/21 1125  BP: 125/64 124/70 131/77 (!) 115/57  Pulse: 94 94 94 93  Resp: 20 20 18    Temp: 99.1 F (37.3 C) 98.5 F (36.9 C) 97.9 F (36.6 C) (!) 97.4 F (36.3 C)  TempSrc: Oral Oral Oral   SpO2: 97% 98% 98% 100%  Weight:  89.8 kg    Height:        Exam:  alert, nad   no jvd  Chest rales L base ow/ clear  Cor reg no RG  Abd soft ntnd no ascites   Ext no LE edema   Alert, NF, ox3   L AVG +bruit    OP HD: TTS at [place] 4hr, 400/500, EDW 89.4kg, 2K/2.5Ca, AVG, heparin 3000 - Mircera 36mcg IV q 4 weeks (last 10/1) - Calcitriol 1.48mcg PO q HD   Assessment/Plan:  CHF exacerbation/volume overload: had HD yest w/ 4L goal, got 2L off, pt cramped. At dry wt today. Better. Plan further UF w/ HD tomorrow to lower dry wt hopefully.   Elevated troponin: Per pmd.   ESRD: HD TTS.  HD tomorrow.   Hypertension/volume: BP stable, +edema LE's on initial exam  Anemia: Hgb 9.9 - not yet due for ESA.  Metabolic bone disease: Ca 8.7, Phos pending. Continue home binders.  HFrEF - echo here w/ LVEF 30-35%    Charles Hart 05/19/2021, 3:13 PM   Recent Labs  Lab 05/17/21 1442 05/18/21 0425 05/18/21 2300 05/19/21 0553  K 3.3* 3.1* 3.6 3.3*  BUN 37* 43*  --  21  CREATININE 8.73* 9.61*  --  6.47*  CALCIUM 9.0 8.7*  --  8.4*  PHOS  --   --   --  3.1  HGB 11.0* 9.9*  --   --    Inpatient medications:  carvedilol  25 mg Oral BID WC   Chlorhexidine Gluconate Cloth  6 each Topical Q0600   furosemide  80 mg Intravenous BID   heparin  5,000 Units Subcutaneous Q8H   hydrALAZINE  100 mg Oral Q8H   multivitamin  1 tablet Oral QHS   pravastatin  40 mg Oral Daily

## 2021-05-19 NOTE — Progress Notes (Signed)
Nutrition Brief Note  Patient identified on the Malnutrition Screening Tool (MST) Report  Wt Readings from Last 15 Encounters:  05/19/21 89.8 kg  05/06/21 93.4 kg  03/26/21 92.1 kg  03/15/21 90.7 kg  03/06/21 93 kg  12/12/20 88 kg  11/17/20 89.5 kg  11/06/20 98.4 kg  06/18/20 94.3 kg  05/26/20 94.5 kg  05/20/20 97.1 kg  05/13/20 94.8 kg  08/01/19 99 kg  04/24/19 89.8 kg  03/18/19 76.72 kg   65 year old male with a history of heart failure and end-stage renal disease on hemodialysis T/R/Sa who presents with complaints of intermittent shortness of breath worsening over the past several months.  He reports that he has noticed increased shortness of breath with increasing dyspnea on exertion.  He reports that previously he was able to walk several miles on his own and is now only able to walk several yards.  He also endorses that he feels more short of breath when he lies back and cannot lie back for very long.  Does endorse that he has noticed some mildly increased swelling in his legs.   He reports good compliance with hemodialysis and denies missing sessions, and he reports that he is pretty good with medication adherence typically only missing 1 dose of medication per week. He is not needing supplemental oxygen.  He does not have symptoms when sitting up in bed.  He denies any current chest pain, headache, or belly pain.  He is on the Lasix at home and reports he does still make urine, urinates roughly 3-4 times a day, however he has noticed decreased output recently.  Pt admitted with CHF , volume overload, and elevated BP.   Reviewed I/O's: -1.8 L x 24 hours and -1.7 L since admission  Spoke with pt, who was sitting in recliner chair at time of visit. He reports feeling well today. Observed breakfast tray, of which pt consumed 100%.   Pt reports good appetite and consumes 2-3 meals per day. On HD days, he skips breakfast, but consumes lunch and dinner (Lunch: vegetable plate from  [employer]; Dinner: fried oysters from [employer]). Eating pattern is the same on non-days, expect he also eats breakfast (pack of nabs and water). Pt consumes mostly water and [employer] drinks.   Reviewed wt hx; wt has been stable over the past month. Per nephrology notes, EDW 89.4 kg. Pt confirms EDW and states "they are going to lower it because I always still have fluid on me after HD".   Nutrition-Focused physical exam completed. Findings are no fat depletion, no muscle depletion, and no edema.    Lab Results  Component Value Date   HGBA1C 5.1 03/04/2021   PTA DM medications are none.   Labs reviewed: K: 3.3, CBGS: 80 (inpatient orders for glycemic control are none).    Current diet order is renal with 1.2 L fluid restriction, patient is consuming approximately 100% of meals at this time. Labs and medications reviewed.   No nutrition interventions warranted at this time. If nutrition issues arise, please consult RD.   Loistine Chance, RD, LDN, [place] Registered Dietitian II Certified Diabetes Care and Education Specialist Please refer to [facility] for RD and/or RD on-call/weekend/after hours pager

## 2021-05-19 NOTE — Progress Notes (Signed)
RN called regarding pt wanting to try cpap, RT placed pt on cpap on the lowest setting, pt immediatly took it off and stated he couldn't wear it. Pt was placed back on [place].

## 2021-05-20 LAB — GLUCOSE, CAPILLARY: Glucose-Capillary: 91 mg/dL (ref 70–99)

## 2021-05-20 LAB — RENAL FUNCTION PANEL
Albumin: 3 g/dL — ABNORMAL LOW (ref 3.5–5.0)
Anion gap: 12 (ref 5–15)
BUN: 31 mg/dL — ABNORMAL HIGH (ref 8–23)
CO2: 27 mmol/L (ref 22–32)
Calcium: 8.4 mg/dL — ABNORMAL LOW (ref 8.9–10.3)
Chloride: 97 mmol/L — ABNORMAL LOW (ref 98–111)
Creatinine, Ser: 8.88 mg/dL — ABNORMAL HIGH (ref 0.61–1.24)
GFR, Estimated: 6 mL/min — ABNORMAL LOW (ref >60)
Glucose, Bld: 172 mg/dL — ABNORMAL HIGH (ref 70–99)
Phosphorus: 3.1 mg/dL (ref 2.5–4.6)
Potassium: 3.3 mmol/L — ABNORMAL LOW (ref 3.5–5.1)
Sodium: 136 mmol/L (ref 135–145)

## 2021-05-20 LAB — CBC
HCT: 31.7 % — ABNORMAL LOW (ref 39.0–52.0)
Hemoglobin: 9.9 g/dL — ABNORMAL LOW (ref 13.0–17.0)
MCH: 28.4 pg (ref 26.0–34.0)
MCHC: 31.2 g/dL (ref 30.0–36.0)
MCV: 91.1 fL (ref 80.0–100.0)
Platelets: 192 10*3/uL (ref 150–400)
RBC: 3.48 MIL/uL — ABNORMAL LOW (ref 4.22–5.81)
RDW: 15.4 % (ref 11.5–15.5)
WBC: 4.9 10*3/uL (ref 4.0–10.5)
nRBC: 0 % (ref 0.0–0.2)

## 2021-05-20 MED ORDER — HEPARIN SODIUM (PORCINE) 1000 UNIT/ML IJ SOLN
INTRAMUSCULAR | Status: AC
  Administered 2021-05-20: 3000 [IU] via INTRAVENOUS_CENTRAL
  Filled 2021-05-20: qty 3

## 2021-05-20 MED ORDER — HEPARIN SODIUM (PORCINE) 1000 UNIT/ML DIALYSIS: 3000.0000 [IU] | Freq: Once | INTRAMUSCULAR | Status: AC

## 2021-05-20 NOTE — Progress Notes (Signed)
Mobility Specialist Criteria Algorithm Info.  Mobility Team: HOB elevated: Activity: Ambulated in hall; Ambulated to bathroom (in chair before ambulation) Range of motion: Active; All extremities Level of assistance: Modified independent, requires aide device or extra time Assistive device: Cane Minutes sitting in chair:  Minutes stood: No data recorded  Minutes ambulated: 4 minutes Distance ambulated (ft): 525 ft Mobility response: Tolerated well Bed Position: Chair  Patient received in recliner chair agreeable to participate in mobility. Ambulated in hallway modified independent with steady gait using cane for stability. Tolerated ambulating on RA well saturating 94-97% throughout. Returned to room without incident or complaint and was left in restroom with all needs met.   Pre: HR 90 During: HR 104 Post: HR 97  05/20/2021 3:58 PM

## 2021-05-20 NOTE — Discharge Summary (Addendum)
Name: Charles Hart MRN: [id number] DOB: 09-Dec-1955 65 y.o. PCP: Lucious Groves, DO  Date of Admission: 05/17/2021  2:26 PM Date of Discharge: 05/20/2021  7:02 PM Attending Physician: Dorian Pod MD  Discharge Diagnosis: 1. Acute exacerbation of heart failure  2. Chronic systolic and diastolic Heart Failure 3. ESRD on Hemodialysis 4. Hypertension 5. Hypokalemia 6. Obstructive Sleep Apnea 7. Normocytic anemia   Discharge Medications: Allergies as of 05/20/2021       Reactions   Ivp Dye [iodinated Diagnostic Agents] Other (See Comments)   Per patient's Nephrologist, he doesn't want the patient exposed to ANY dye because of issues with his kidneys   Hydrocodone    Caused hands and feet to peel    Phentermine Swelling   Amlodipine Swelling   Lisinopril Cough   Red Dye Other (See Comments)   NO dye of any kind (issues with his kidneys)        Medication List     STOP taking these medications    sildenafil 50 MG tablet Commonly known as: VIAGRA       TAKE these medications    acetaminophen 325 MG tablet Commonly known as: TYLENOL Take 2 tablets (650 mg total) by mouth every 6 (six) hours as needed for mild pain. What changed: Another medication with the same name was removed. Continue taking this medication, and follow the directions you see here.   calcium acetate 667 MG capsule Commonly known as: PHOSLO Take 2,001 mg by mouth 3 (three) times daily with meals.   carvedilol 25 MG tablet Commonly known as: COREG Take 1 tablet (25 mg total) by mouth 2 (two) times daily with a meal.   famotidine 20 MG tablet Commonly known as: Pepcid One after supper What changed:  how much to take how to take this when to take this additional instructions   fluticasone 50 MCG/ACT nasal spray Commonly known as: FLONASE Place 2 sprays into both nostrils as needed for allergies.   folic acid-vitamin b complex-vitamin c-selenium-zinc 3 MG Tabs tablet Take 1  tablet by mouth daily.   furosemide 80 MG tablet Commonly known as: LASIX Take 1 tablet (80 mg total) by mouth 2 (two) times daily.   gabapentin 300 MG capsule Commonly known as: NEURONTIN Take 300 mg by mouth at bedtime.   hydrALAZINE 100 MG tablet Commonly known as: APRESOLINE TAKE 1 TABLET(100 MG) BY MOUTH THREE TIMES DAILY What changed:  how much to take how to take this when to take this   meclizine 12.5 MG tablet Commonly known as: ANTIVERT Take 1 tablet (12.5 mg total) by mouth 3 (three) times daily as needed for dizziness.   pantoprazole 40 MG tablet Commonly known as: Protonix Take 1 tablet (40 mg total) by mouth daily.   pravastatin 40 MG tablet Commonly known as: PRAVACHOL Take 1 tablet (40 mg total) by mouth daily. What changed: when to take this   SUPER B COMPLEX/C PO Take 1 tablet by mouth daily.        Disposition and follow-up:   Mr.Ruffus E Friday was discharged from [facility] in Stable condition.  At the hospital follow up visit please address:  1. Symptoms of heart failure; adherence to medical regimen and low salt diet. 2. Systolic and Diastolic Heart Failure 3. ESRD on Hemodialysis (he does make some urine; lasix is a daily medicine) 4. Hypertension 5. Hypokalemia due to diuretic 6. Obstructive Sleep Apnea - sleep study scheduled 7. Normocytic anemia  2.  Labs / imaging needed at time of follow-up: CBC, CMP  3.  Pending labs/ test needing follow-up: None  Follow-up Appointments: -Please ensure patient follow up with nephrology, pt has upcoming sleep study.    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 Given IV Lasix 120 mg.  -Echo repeated this admission             -EF unchanged from but echo shows bi-atrial dilation and  right ventricle dilation -Nephrology consulted this admission: Adjustments to HD.  Only 2 L UF was removed with plans to remove 4 L on initial dialysis. Next dialysis removed 2.5 L -Coreg 25 mg BID and hydralazine were continued   -Patient not taking Viagra anymore so would advise BiDil for his CHF if not contraindicated (if cost prohibitive, nitrate can be added to existing hydralazine).   Primary hypertension  Patient with elevated bp secondary to hypervolemia. He was normotensive after HD. -Restarted home Lasix 80 mg BID.  -Continued Hydralazine 100 mg TID and Coreg 25 mg BID   Hypokalemia  Patient has hypokalemia. He has known ESRD on HD and is taking lasix 80 mg BID. Repeat K was 3.3 s/p HD  -Nephrology supplemented with HD   OSA Patient has hx of OSA but stated has not used CPAP  -RT gave pt CPAP but he was not able to wear it. He states he was told he needed another sleep study.  Subjective Exam: No acute concerns endorsed.  Discharge Exam:   BP 117/60 (BP Location: Right Arm)   Pulse 91   Temp 98.3 F (36.8 C) (Oral)   Resp 18   Ht 5\' 9"  (1.753 m)   Wt 88.2 kg   SpO2 96%   BMI 28.71 kg/m  Physical Exam General: NAD, pt was receiving HD at time of assessment.  HENT: atraumatic, normocephalic Cardiovascular: normal heart sounds, normal pulses. Minimal JVD Pulmonary: normal work of breathing, CTAB Abdominal: Non TTP, normal bowel sounds MSK: Trace LE edema Neuro: alert and oriented x4 Psych: normal mood and normal affect  Pertinent Labs, Studies, and Procedures:  CBC Latest Ref Rng & Units 05/20/2021 05/18/2021 05/17/2021  WBC 4.0 - 10.5 K/uL 4.9 5.1 5.4  Hemoglobin 13.0 - 17.0 g/dL 9.9(L) 9.9(L) 11.0(L)  Hematocrit 39.0 - 52.0 % 31.7(L) 32.5(L) 35.1(L)  Platelets 150 - 400 K/uL 192 182 188    CMP Latest Ref Rng & Units 05/20/2021 05/19/2021 05/18/2021  Glucose 70 - 99 mg/dL 172(H) 86 -  BUN 8 - 23 mg/dL 31(H) 21 -  Creatinine 0.61 - 1.24 mg/dL 8.88(H) 6.47(H) -   Sodium 135 - 145 mmol/L 136 136 -  Potassium 3.5 - 5.1 mmol/L 3.3(L) 3.3(L) 3.6  Chloride 98 - 111 mmol/L 97(L) 98 -  CO2 22 - 32 mmol/L 27 28 -  Calcium 8.9 - 10.3 mg/dL 8.4(L) 8.4(L) -  Total Protein 6.5 - 8.1 g/dL - - -  Total Bilirubin 0.3 - 1.2 mg/dL - - -  Alkaline Phos 38 - 126 U/L - - -  AST 15 - 41 U/L - - -  ALT 0 - 44 U/L - - -    DG Chest 2 View  Result Date: 05/17/2021 CLINICAL DATA:  Shortness of breath with cough. EXAM: CHEST - 2 VIEW COMPARISON:  Chest x-ray 03/15/2021. FINDINGS: Heart is enlarged,  unchanged. There is central pulmonary vascular congestion. The lungs are clear. There is no pleural effusion or pneumothorax. No acute fractures are seen. There is a healed right seventh rib fracture. IMPRESSION: 1. Cardiomegaly with central pulmonary vascular congestion. Electronically Signed   By: Ronney Asters M.D.   On: 05/17/2021 16:04   ECHOCARDIOGRAM COMPLETE  Result Date: 05/18/2021    ECHOCARDIOGRAM REPORT   Patient Name:   BLESS BELSHE  IMPRESSIONS  1. EF similar to echo done 08/13/20 . Left ventricular ejection fraction, by estimation, is 30 to 35%. The left ventricle has severely decreased function. The left ventricle demonstrates global hypokinesis. The left ventricular internal cavity size was severely dilated. Left ventricular diastolic parameters were normal.  2. Right ventricular systolic function is mildly reduced. The right ventricular size is mildly enlarged.  3. Left atrial size was moderately dilated.  4. Right atrial size was moderately dilated.  5. The mitral valve is abnormal. Mild to moderate mitral valve regurgitation. No evidence of mitral stenosis.  6. The aortic valve is tricuspid. Aortic valve regurgitation is trivial. Mild aortic valve sclerosis is present, with no evidence of aortic valve stenosis.  7. Aortic dilatation noted. There is mild dilatation of the ascending aorta, measuring 41 mm.  8. The inferior vena cava is normal in size with greater  than 50% respiratory variability, suggesting right atrial pressure of 3 mmHg.  Discharge Instructions: Discharge Instructions     Call MD for:  difficulty breathing, headache or visual disturbances   Complete by: As directed    Call MD for:  extreme fatigue   Complete by: As directed    Call MD for:  hives   Complete by: As directed    Call MD for:  persistant dizziness or light-headedness   Complete by: As directed    Call MD for:  persistant nausea and vomiting   Complete by: As directed    Call MD for:  redness, tenderness, or signs of infection (pain, swelling, redness, odor or green/yellow discharge around incision site)   Complete by: As directed    Call MD for:  severe uncontrolled pain   Complete by: As directed    Call MD for:  temperature >100.4   Complete by: As directed    Diet - low sodium heart healthy   Complete by: As directed    Discharge instructions   Complete by: As directed    It was a pleasure taking care of you!   You came to the ED with shortness of breath and fatigue. You were found to have too much fluid. You received dialysis and felt better afterwards. After the second dialysis, you felt even better. Your cough improved and the fluid status improved as well meaning you did not have too much fluid. We recommend following up with your PCP next week and following with nephrology outpatient so they can continue to adjust your dialysis sessions.   When we scanned your heart in a procedure called Echocardiogram we saw you had some more changes in function. Your heart was pumping same as it was previously but it was showing some signs of lower function. We encourage you to wear CPAP daily as you stated you are not able to wear it at home due to high pressures. You stated you have another sleep study scheduled. We advise close follow up with your PCP.   Increase activity slowly   Complete by: As directed        Signed: Idamae Schuller,  MD Tillie Rung. [facility] Internal Medicine Residency, PGY-1  05/20/2021, 8:00 PM   [phone]

## 2021-05-20 NOTE — Progress Notes (Signed)
Discharge paperwork reviewed with patient. All questions answered. PIV removed. No changes to home medication regimen. Follow up appointments set. Pt leaving via wheelchair to be transported to personal vehicle. Will drive self home.

## 2021-05-20 NOTE — Progress Notes (Signed)
  SATURATION QUALIFICATIONS: (This note is used to comply with regulatory documentation for home oxygen)  Patient Saturations on Room Air at Rest = 97%  Patient Saturations on Room Air while Ambulating = 94%  Patient Saturations on 0 Liters of oxygen while Ambulating = n/a%  Please briefly explain why patient needs home oxygen:   05/20/2021 3:58 PM

## 2021-05-20 NOTE — Progress Notes (Addendum)
[facility] Progress Note  Subjective: seen in room, no c/o today  Vitals:   05/20/21 1230 05/20/21 1300 05/20/21 1301 05/20/21 1310  BP: 132/77 131/65 127/70 131/79  Pulse:      Resp: (!) 33 (!) 32 18 (!) 32  Temp:    97.9 F (36.6 C)  TempSrc:    Oral  SpO2:      Weight:      Height:        Exam:  alert, nad   no jvd  Chest rales L base ow/ clear  Cor reg no RG  Abd soft ntnd no ascites   Ext no LE edema   Alert, NF, ox3   L AVG +bruit    OP HD: TTS at [place] 4hr, 400/500, EDW 89.4kg, 2K/2.5Ca, AVG, heparin 3000 - Mircera 79mcg IV q 4 weeks (last 10/1) - Calcitriol 1.63mcg PO q HD   Assessment/Plan:  CHF exacerbation/volume overload: HD this am w/ about 2.5 L off. On nasal O2. SOB "much better", still coughing though. Post HD wt is 1k under dry wt and he is 3kg down since admission. Initial CXR wasn't impressive for edema, so he may have something else going on.   Elevated troponin: Per pmd.   ESRD: HD TTS.  HD today  Hypertension/volume: BP stable, +edema LE's has mostly resolved  Anemia: Hgb 9.9 - not yet due for ESA.  Metabolic bone disease: Ca 8.7, Phos pending. Continue home binders.  HFrEF - echo here w/ LVEF 30-35%    Rob Jamicah Anstead 05/20/2021, 1:37 PM   Recent Labs  Lab 05/18/21 0425 05/18/21 2300 05/19/21 0553 05/20/21 1007 05/20/21 1008  K 3.1*   < > 3.3* 3.3*  --   BUN 43*  --  21 31*  --   CREATININE 9.61*  --  6.47* 8.88*  --   CALCIUM 8.7*  --  8.4* 8.4*  --   PHOS  --   --  3.1 3.1  --   HGB 9.9*  --   --   --  9.9*   < > = values in this interval not displayed.    Inpatient medications:  carvedilol  25 mg Oral BID WC   Chlorhexidine Gluconate Cloth  6 each Topical Q0600   furosemide  80 mg Oral BID   heparin  5,000 Units Subcutaneous Q8H   hydrALAZINE  100 mg Oral Q8H   multivitamin  1 tablet Oral QHS   pravastatin  40 mg Oral Daily

## 2021-05-21 ENCOUNTER — Telehealth (HOSPITAL_COMMUNITY): Payer: Self-pay | Admitting: Nephrology

## 2021-05-21 NOTE — Telephone Encounter (Signed)
Transition of care contact from inpatient facility  Date of discharge: 05/20/21 Date of contact: 05/21/21 Method: Phone Spoke to: Patient  Patient contacted to discuss transition of care from recent inpatient hospitalization. Patient was admitted to [facility] from 10/3 - 05/20/2021 with discharge diagnosis of volume overloaded  Medication changes were reviewed. Still on Lasix BID - doesn't urinate much - told to discuss if still needs with Dr. Marval Regal - doubt needs anymore.  Patient will follow up with his/her outpatient HD unit on: tomorrow.  Dry weight lowered for HD tomorrow - hopefully will do ok with it.  Veneta Penton, PA-C [employer] Pager [phone]

## 2021-05-26 ENCOUNTER — Encounter: Payer: Self-pay | Admitting: Internal Medicine

## 2021-05-26 ENCOUNTER — Ambulatory Visit (INDEPENDENT_AMBULATORY_CARE_PROVIDER_SITE_OTHER): Payer: 59 | Admitting: Internal Medicine

## 2021-05-26 ENCOUNTER — Other Ambulatory Visit: Payer: Self-pay

## 2021-05-26 DIAGNOSIS — R0609 Other forms of dyspnea: Secondary | ICD-10-CM

## 2021-05-26 DIAGNOSIS — R058 Other specified cough: Secondary | ICD-10-CM | POA: Diagnosis not present

## 2021-05-26 NOTE — Patient Instructions (Signed)
Continue Pantoprazole (protonix) 40 mg   Take  30-60 min before first meal of the day and Pepcid (famotidine)  20 mg after supper until return to office - this is the best way to tell whether stomach acid is contributing to your problem.    GERD (REFLUX)  is an extremely common cause of respiratory symptoms just like yours , many times with no obvious heartburn at all.    It can be treated with medication, but also with lifestyle changes including elevation of the head of your bed (ideally with 6 -8inch blocks under the headboard of your bed),  Smoking cessation, avoidance of late meals, excessive alcohol, and avoid fatty foods, chocolate, peppermint, colas, red wine, and acidic juices such as orange juice.  NO MINT OR MENTHOL PRODUCTS SO NO COUGH DROPS  USE SUGARLESS CANDY INSTEAD (Jolley ranchers or Stover's or Life Savers) or even ice chips will also do - the key is to swallow to prevent all throat clearing. NO OIL BASED VITAMINS - use powdered substitutes.  Avoid fish oil when coughing.    Follow up is as needed

## 2021-05-26 NOTE — Assessment & Plan Note (Signed)
Onset July 2022 rx with mint/menthol - 03/26/2021 rec no mint/menthol and rx with max gerd rx x 6 weeks then ov   Improved on diet/ gerd rx / no additional f/u needed

## 2021-05-26 NOTE — Progress Notes (Signed)
Charles Hart, male    DOB: June 24, 1956, 65 y.o.   MRN: [id number]    Brief patient profile:  65yobm never smoker   referred to pulmonary clinic in [place]  03/26/2021 by Dr    Marval Regal for cough abrupt onset early July 2022 assoc with sob noct but not with adls    History of Present Illness  03/26/2021  Pulmonary/ 1st office eval/ Makaiah Terwilliger / [facility]  Chief Complaint  Patient presents with   Pulmonary Consult    Referred by Dr. Donato Heinz. Pt c/o non prod cough with SOB x 1 month.    Dyspnea:  MMRC1 = can walk nl pace, flat grade, can't hurry or go uphills or steps s sob   Cough: dry less when asleep  Sleep: sob worse  when lie down - can't tell worse on nights when not HD SABA use: none  Using lots of mints/ menthols due to sense of pnds  Rec Pantoprazole (protonix) 40 mg   Take  30-60 min before first meal of the day and Pepcid (famotidine)  20 mg after supper until return to office -  GERD diet reviewed, bed blocks rec   Pay attention to how you do with your breathing when you lie down on Sat night  vs Sun vs Monday Please schedule a follow up office visit in 8 weeks, call sooner if needed with all medications /inhalers/ solutions in hand   Date of Admission: 05/17/2021  2:26 PM Date of Discharge: 05/20/2021  7:02 PM   Discharge Diagnosis: 1. Acute exacerbation of heart failure  2. Chronic systolic and diastolic Heart Failure 3. ESRD on Hemodialysis 4. Hypertension 5. Hypokalemia 6. Obstructive Sleep Apnea 7. Normocytic anemia   05/26/2021  f/u ov/[place] office/Mandeep Kiser re: cough x July 2022/noct sob  maint on nothing  Chief Complaint  Patient presents with   Follow-up    Cough has improved. Admitted into hosp 10/3-10/10 for SOB.    Dyspnea:  MMRC1 = can walk nl pace, flat grade, can't hurry or go uphills or steps s sob   Cough: "not a bid deal" ,  clears throat some still s mucus production  Sleeping: flat bed/ 1-2 pillows  DH  T - Th - Sat  SABA  use: none  02: none  Covid status: vax x 2      No obvious day to day or daytime variability or assoc excess/ purulent sputum or mucus plugs or hemoptysis or cp or chest tightness, subjective wheeze or overt sinus or hb symptoms.   Sleeping  without nocturnal  or early am exacerbation  of respiratory  c/o's or need for noct saba. Also denies any obvious fluctuation of symptoms with weather or environmental changes or other aggravating or alleviating factors except as outlined above   No unusual exposure hx or h/o childhood pna/ asthma or knowledge of premature birth.  Current Allergies, Complete Past Medical History, Past Surgical History, Family History, and Social History were reviewed in [employer] record.  ROS  The following are not active complaints unless bolded Hoarseness, sore throat, dysphagia, dental problems, itching, sneezing,  nasal congestion or discharge of excess mucus or purulent secretions, ear ache,   fever, chills, sweats, unintended wt loss or wt gain, classically pleuritic or exertional cp,  orthopnea pnd or arm/hand swelling  or leg swelling, presyncope, palpitations, abdominal pain, anorexia, nausea, vomiting, diarrhea  or change in bowel habits or change in bladder habits, change in stools or change  in urine, dysuria, hematuria,  rash, arthralgias, visual complaints, headache, numbness, weakness or ataxia or problems with walking or coordination,  change in mood or  memory.        Current Meds  Medication Sig   acetaminophen (TYLENOL) 325 MG tablet Take 2 tablets (650 mg total) by mouth every 6 (six) hours as needed for mild pain.   calcium acetate (PHOSLO) 667 MG capsule Take 2,001 mg by mouth 3 (three) times daily with meals.    carvedilol (COREG) 25 MG tablet Take 1 tablet (25 mg total) by mouth 2 (two) times daily with a meal.   famotidine (PEPCID) 20 MG tablet One after supper (Patient taking differently: Take 20 mg by mouth daily after  supper.)   fluticasone (FLONASE) 50 MCG/ACT nasal spray Place 2 sprays into both nostrils as needed for allergies.   folic acid-vitamin b complex-vitamin c-selenium-zinc (DIALYVITE) 3 MG TABS tablet Take 1 tablet by mouth daily.   furosemide (LASIX) 80 MG tablet Take 1 tablet (80 mg total) by mouth 2 (two) times daily.   gabapentin (NEURONTIN) 300 MG capsule Take 300 mg by mouth at bedtime.   hydrALAZINE (APRESOLINE) 100 MG tablet TAKE 1 TABLET(100 MG) BY MOUTH THREE TIMES DAILY (Patient taking differently: Take 100 mg by mouth 3 (three) times daily. TAKE 1 TABLET(100 MG) BY MOUTH THREE TIMES DAILY)   meclizine (ANTIVERT) 12.5 MG tablet Take 1 tablet (12.5 mg total) by mouth 3 (three) times daily as needed for dizziness.   pantoprazole (PROTONIX) 40 MG tablet Take 1 tablet (40 mg total) by mouth daily.   pravastatin (PRAVACHOL) 40 MG tablet Take 1 tablet (40 mg total) by mouth daily. (Patient taking differently: Take 40 mg by mouth at bedtime.)   SUPER B COMPLEX/C PO Take 1 tablet by mouth daily.                Past Medical History:  Diagnosis Date   Adenomatous colon polyp 07/02/2011   Last colonoscopy May 06, 2011 by Dr. Owens Loffler, who recommended repeat colonoscopy in 5 years.    Anemia    Background diabetic retinopathy 04/20/2012   Patient is followed by Dr. Katy Fitch    Cancer [facility])    Kidney   Cardiomyopathy    LV function improved from 2004 to 2008.  Historically, moderately dilated LV with EF 30-40% by 2D echo 08/14/2002.  Mild CAD with severe LV dysfunction by cardiac cath 09/2002.  Normal coronary arteries and normal LV function by cardiac cath 09/19/2006.  A 2-D echo on 04/01/2009 showed mild concentric hypertrophy and normal systolic (LVEF  03-50%) and doppler C/W with grade 1 diastolic dysfunction.   CHF (congestive heart failure) (HCC)    LV function improved from 2004 to 2008.  Historically, moderately dilated LV with EF 30-40% by 2D echo 08/14/2002.  Mild CAD with  severe LV dysfunction by cardiac cath 09/2002.  Normal coronary arteries and normal LV function by cardiac cath 09/19/2006.  A 2-D echo on 04/01/2009 showed mild concentric hypertrophy and normal systolic (LVEF  09-38%) and doppler C/W with grade 1 diastolic dysfunction..    Chronic combined systolic and diastolic congestive heart failure ([place]) 05/21/2010   LV function improved from 2004 to 2008.  Historically, moderately dilated LV with EF 30-40% by 2D echo 08/14/2002.  Mild CAD with severe LV dysfunction by cardiac cath 09/2002.  Normal coronary arteries and normal LV function by cardiac cath 09/19/2006.  A 2-D echo on 04/01/2009 showed mild concentric hypertrophy and  normal systolic (LVEF  99-37%) and doppler parameters consistent with abnormal left    CVA (cerebral vascular accident) ([place]) 07/04/2012   MRI of the brain 07/04/2012 showed an acute infarct in the right basal ganglia involving the anterior putamen, anterior limb internal capsule, and head of the caudate; this measured approximately 2.5 cm in diameter.      Dermatitis    Diabetes mellitus    type 2   DIABETIC PERIPHERAL NEUROPATHY 08/03/2007   Qualifier: Diagnosis of  By: Marinda Elk MD, Sonia Side     DM neuropathy, painful ([place])    fingers and right knee   ESRD (end stage renal disease) ([place])    Stage 4, on hemodailysis x 4 months as of 05-18-18 [place] [facility], tues thurs sat   ESRD (end stage renal disease) on dialysis ([name])    "TTS; [facility]" (05/31/2018)   Hearing loss in right ear    Hyperlipidemia    Hypertension    Hypertensive crisis 07/28/2012   Hypertensive urgency 08/20/2014   Myocardial infarction [facility])  many yrs ago   Nephrotic syndrome 02/18/2013   A 24-hour urine collection 03/04/2013 showed total protein of 5,460 g and creatinine clearance of 80 mL/minute.  Patient was seen by Seward Meth at [place] and a repeat 24-hour urine showed 10,407 mg protein.  Patient underwent kidney biopsy on  05/30/2013; pathology showed advanced diffuse and nodular diabetic nephropathy with vascular changes consistent with long-standing difficult to control hypertension.      Pneumonia 2014 and 2015   Renal insufficiency    Sleep apnea    11/06/20 - hasn't used it "in a while"        Objective:       Wt Readings from Last 3 Encounters:  05/26/21 200 lb 1.9 oz (90.8 kg)  05/20/21 194 lb 7.1 oz (88.2 kg)  05/06/21 205 lb 14.4 oz (93.4 kg)      Vital signs reviewed  05/26/2021  - Note at rest 02 sats  94% on RA   General appearance:    amb bm nad freq throat clearing / wearing flip flops    HEENT : pt wearing mask not removed for exam due to covid -19 concerns.    NECK :  without JVD/Nodes/TM/ nl carotid upstrokes bilaterally   LUNGS: no acc muscle use,  Nl contour chest with minimal insp crackles in bases without cough on insp or exp maneuvers   CV:  RRR  no s3 or murmur or increase in P2, and no edema   ABD:  soft and nontender with nl inspiratory excursion in the supine position. No bruits or organomegaly appreciated, bowel sounds nl  MS:  Nl gait/ ext warm without deformities, calf tenderness, cyanosis or clubbing No obvious joint restrictions   SKIN: warm and dry without lesions    NEURO:  alert, approp, nl sensorium with  no motor or cerebellar deficits apparent.       I personally reviewed images and agree with radiology impression as follows:  CXR:   Pa and lateral 05/17/21 cardiomegaly with central pulmonary vascular congestion.      Assessment

## 2021-05-26 NOTE — Assessment & Plan Note (Addendum)
Onset July 2022  - pt not sure whether better or not berfore or after HD as of 03/26/2021  - 03/26/2021 rec max rx for gerd, observe whether reported noct sob correlates with HD - 03/26/2021   Walked on RA x  3  lap(s) =  approx 150 @ moderate pace, stopped due to end of study with  min sob  with lowest 02 sats 97  Echo 05/18/21 EF similar to echo done 08/13/20 . Left ventricular ejection fraction,  by estimation, is 30 to 35%. The left ventricle has severely decreased  function. The left ventricle demonstrates global hypokinesis. The left  ventricular internal cavity size was  severely dilated. Left ventricular diastolic parameters were normal.  2. Right ventricular systolic function is mildly reduced. The right  ventricular size is mildly enlarged.  3. Left atrial size was moderately dilated.  4. Right atrial size was moderately dilated.  5. The mitral valve is abnormal. Mild to moderate mitral valve  regurgitation. No evidence of mitral stenosis.  6. The aortic valve is tricuspid. Aortic valve regurgitation is trivial.  Mild aortic valve sclerosis is present, with no evidence of aortic valve  stenosis.  Left Atrium: Left atrial size was moderately dilated. - 05/26/2021   Walked on RA  x  3  lap(s) =  approx 450 ft @ fast pace, stopped due to end of study, min sob last lap with lowest 02 sats 97%    Most likely deconditioning plus chf / no additional w/u needed but advised reg paced (submax ex) and monitor sats at peak ex and call if any decline over time  Pulmonary f/u can be prn  Each maintenance medication was reviewed in detail including emphasizing most importantly the difference between maintenance and prns and under what circumstances the prns are to be triggered using an action plan format where appropriate.  Total time for H and P, chart review, counseling,  directly observing portions of ambulatory 02 saturation study/ and generating customized AVS unique to this summary final  office visit / same day charting = 31

## 2021-06-01 ENCOUNTER — Other Ambulatory Visit: Payer: Self-pay

## 2021-06-01 ENCOUNTER — Ambulatory Visit: Payer: 59 | Attending: Internal Medicine | Admitting: Neurology

## 2021-06-01 DIAGNOSIS — G4733 Obstructive sleep apnea (adult) (pediatric): Secondary | ICD-10-CM | POA: Diagnosis not present

## 2021-06-01 DIAGNOSIS — Z79899 Other long term (current) drug therapy: Secondary | ICD-10-CM | POA: Insufficient documentation

## 2021-06-08 NOTE — Procedures (Signed)
Ostrander A. Merlene Laughter, MD     [email or web address]             NOCTURNAL POLYSOMNOGRAPHY   LOCATION: [facility]   Patient Name: Charles, Hart Date: 06/01/2021 Gender: Male D.O.B: [birth year] Age (years): 65 Referring Provider: Lemar Lofty DO Height (inches): 69 Interpreting Physician: Phillips Odor MD, ABSM Weight (lbs): 200 RPSGT: Rosebud Poles BMI: 30 MRN: [id number] Neck Size: 16.50 CLINICAL INFORMATION Sleep Study Type: Split Night CPAP     Indication for sleep study: N/A     Epworth Sleepiness Score: 8  SLEEP STUDY TECHNIQUE As per the AASM Manual for the Scoring of Sleep and Associated Events v2.3 (April 2016) with a hypopnea requiring 4% desaturations.  The channels recorded and monitored were frontal, central and occipital EEG, electrooculogram (EOG), submentalis EMG (chin), nasal and oral airflow, thoracic and abdominal wall motion, anterior tibialis EMG, snore microphone, electrocardiogram, and pulse oximetry. Continuous positive airway pressure (CPAP) was initiated when the patient met split night criteria and was titrated according to treat sleep-disordered breathing.  MEDICATIONS Medications self-administered by patient taken the night of the study : N/A  Current Outpatient Medications:    acetaminophen (TYLENOL) 325 MG tablet, Take 2 tablets (650 mg total) by mouth every 6 (six) hours as needed for mild pain., Disp: , Rfl:    calcium acetate (PHOSLO) 667 MG capsule, Take 2,001 mg by mouth 3 (three) times daily with meals. , Disp: , Rfl:    carvedilol (COREG) 25 MG tablet, Take 1 tablet (25 mg total) by mouth 2 (two) times daily with a meal., Disp: 180 tablet, Rfl: 1   famotidine (PEPCID) 20 MG tablet, One after supper (Patient taking differently: Take 20 mg by mouth daily after supper.), Disp: 30 tablet, Rfl: 11   fluticasone (FLONASE) 50 MCG/ACT nasal spray, Place 2 sprays into both nostrils as needed for allergies., Disp:  16 g, Rfl: 1   folic acid-vitamin b complex-vitamin c-selenium-zinc (DIALYVITE) 3 MG TABS tablet, Take 1 tablet by mouth daily., Disp: , Rfl:    furosemide (LASIX) 80 MG tablet, Take 1 tablet (80 mg total) by mouth 2 (two) times daily., Disp: 180 tablet, Rfl: 1   gabapentin (NEURONTIN) 300 MG capsule, Take 300 mg by mouth at bedtime., Disp: , Rfl:    hydrALAZINE (APRESOLINE) 100 MG tablet, TAKE 1 TABLET(100 MG) BY MOUTH THREE TIMES DAILY (Patient taking differently: Take 100 mg by mouth 3 (three) times daily. TAKE 1 TABLET(100 MG) BY MOUTH THREE TIMES DAILY), Disp: 270 tablet, Rfl: 0   meclizine (ANTIVERT) 12.5 MG tablet, Take 1 tablet (12.5 mg total) by mouth 3 (three) times daily as needed for dizziness., Disp: 30 tablet, Rfl: 0   pantoprazole (PROTONIX) 40 MG tablet, Take 1 tablet (40 mg total) by mouth daily., Disp: 30 tablet, Rfl: 22   pravastatin (PRAVACHOL) 40 MG tablet, Take 1 tablet (40 mg total) by mouth daily. (Patient taking differently: Take 40 mg by mouth at bedtime.), Disp: 90 tablet, Rfl: 1   SUPER B COMPLEX/C PO, Take 1 tablet by mouth daily., Disp: , Rfl:   RESPIRATORY PARAMETERS Diagnostic  Total AHI (/hr): 72.5 RDI (/hr): 72.5 OA Index (/hr): 4.3 CA Index (/hr): 9.1 REM AHI (/hr): 52.7 NREM AHI (/hr): 75.9 Supine AHI (/hr): 80.3 Non-supine AHI (/hr): 69.6 Min O2 Sat (%): 75.00 Mean O2 (%): 89.97 Time below 88% (min): 47.2   Titration  Optimal Pressure (cm):  AHI at Optimal Pressure (/hr): N/A Min O2 at Optimal Pressure (%):  N/A Supine % at Optimal (%): N/A Sleep % at Optimal (%): N/A   SLEEP ARCHITECTURE The recording time for the entire night was 192 minutes.  During a baseline period of 192.0 minutes, the patient slept for 139.0 minutes in REM and nonREM, yielding a sleep efficiency of 72.4%. Sleep onset after lights out was 16.3 minutes with a REM latency of 85.0 minutes. The patient spent 11.15% of the night in stage N1 sleep, 74.10% in stage N2 sleep, 0.00% in stage N3  and 14.8% in REM.     During the titration period of 0.0 minutes, the patient slept for 0.0 minutes in REM and nonREM, yielding a sleep efficiency of N/A%. Sleep onset after CPAP initiation was N/A minutes with a REM latency of N/A minutes. The patient spent N/A% of the night in stage N1 sleep, N/A% in stage N2 sleep, N/A% in stage N3 and 0% in REM.  CARDIAC DATA The 2 lead EKG demonstrated sinus rhythm. The mean heart rate was 97.91 beats per minute. Other EKG findings include: PVCs.  LEG MOVEMENT DATA The total Periodic Limb Movements of Sleep (PLMS) were 0. The PLMS index was 0.00.  IMPRESSIONS  Severe obstructive sleep apnea syndrome is documented with this recording. A trial of auto PAP 8-14 is recommended.  Delano Metz, MD Diplomate, American Board of Sleep Medicine.  ELECTRONICALLY SIGNED ON:  06/08/2021, 11:30 AM [place] PH: [phone]   FX: [phone] [place]

## 2021-06-18 ENCOUNTER — Other Ambulatory Visit: Payer: Self-pay | Admitting: Internal Medicine

## 2021-06-18 DIAGNOSIS — G4733 Obstructive sleep apnea (adult) (pediatric): Secondary | ICD-10-CM

## 2021-06-18 NOTE — Progress Notes (Signed)
Reviewed Sleep study, recommended Auto pap 8-14 setting.  Placed new CPAP order

## 2021-06-21 ENCOUNTER — Encounter: Payer: Self-pay | Admitting: Internal Medicine

## 2021-06-21 ENCOUNTER — Other Ambulatory Visit: Payer: Self-pay

## 2021-06-21 ENCOUNTER — Ambulatory Visit (INDEPENDENT_AMBULATORY_CARE_PROVIDER_SITE_OTHER): Payer: 59 | Admitting: Internal Medicine

## 2021-06-21 ENCOUNTER — Telehealth: Payer: Self-pay | Admitting: *Deleted

## 2021-06-21 VITALS — BP 146/91 | HR 98 | Temp 97.6°F | Ht 69.5 in | Wt 194.4 lb

## 2021-06-21 DIAGNOSIS — K529 Noninfective gastroenteritis and colitis, unspecified: Secondary | ICD-10-CM

## 2021-06-21 DIAGNOSIS — G4733 Obstructive sleep apnea (adult) (pediatric): Secondary | ICD-10-CM

## 2021-06-21 DIAGNOSIS — I5042 Chronic combined systolic (congestive) and diastolic (congestive) heart failure: Secondary | ICD-10-CM

## 2021-06-21 DIAGNOSIS — R0602 Shortness of breath: Secondary | ICD-10-CM | POA: Insufficient documentation

## 2021-06-21 NOTE — Telephone Encounter (Signed)
Patient called in stating he was hospitalized for difficulty breathing and doesn't really notice a difference since d/c. Patient was d/c on 10/6 and has not had a HFU. He is able to speak in full sentences w/o stopping during call; no cough noted. No openings today and patient has HD on Tuesdays. First available appt given for 11/9 at 0915 with Red Team. He is advised not to wait for this appt and head to ED if he feels breathing is getting more difficult. Discussed [facility] ED or [facility].

## 2021-06-21 NOTE — Telephone Encounter (Signed)
Patient called back stating he's also having diarrhea. Appt became available today at 3:15 with [facility] Team

## 2021-06-21 NOTE — Patient Instructions (Addendum)
It was nice seeing you today! Thank you for choosing [facility] for your Primary Care.    Today we talked about:   Breathing difficulties: I suspect this is due to both your heart failure and sleep apnea. We will call you to set up the CPAP  Diarrhea: I suspect this is what we called functional diarrhea or irritable  bowel syndrome. I recommend eating a diet high in fiber. You can take over the counter diarrhea medication as needed but do not take daily.

## 2021-06-21 NOTE — Progress Notes (Signed)
CC: Shortness of breathe; HF; diarrhea  HPI:  [name] is a 65 y.o. with a PMHx as listed below who presents to the clinic for Shortness of breathe; HF; diarrhea.   Please see the Encounters tab for problem-based Assessment & Plan regarding status of patient's acute and chronic conditions.  Past Medical History:  Diagnosis Date   Adenomatous colon polyp 07/02/2011   Last colonoscopy May 06, 2011 by Dr. Owens Loffler, who recommended repeat colonoscopy in 5 years.    Anemia    Background diabetic retinopathy 04/20/2012   Patient is followed by Dr. Katy Fitch    Cancer [facility])    Kidney   Cardiomyopathy    LV function improved from 2004 to 2008.  Historically, moderately dilated LV with EF 30-40% by 2D echo 08/14/2002.  Mild CAD with severe LV dysfunction by cardiac cath 09/2002.  Normal coronary arteries and normal LV function by cardiac cath 09/19/2006.  A 2-D echo on 04/01/2009 showed mild concentric hypertrophy and normal systolic (LVEF  82-95%) and doppler C/W with grade 1 diastolic dysfunction.   CHF (congestive heart failure) (HCC)    LV function improved from 2004 to 2008.  Historically, moderately dilated LV with EF 30-40% by 2D echo 08/14/2002.  Mild CAD with severe LV dysfunction by cardiac cath 09/2002.  Normal coronary arteries and normal LV function by cardiac cath 09/19/2006.  A 2-D echo on 04/01/2009 showed mild concentric hypertrophy and normal systolic (LVEF  62-13%) and doppler C/W with grade 1 diastolic dysfunction..    Chronic combined systolic and diastolic congestive heart failure ([place]) 05/21/2010   LV function improved from 2004 to 2008.  Historically, moderately dilated LV with EF 30-40% by 2D echo 08/14/2002.  Mild CAD with severe LV dysfunction by cardiac cath 09/2002.  Normal coronary arteries and normal LV function by cardiac cath 09/19/2006.  A 2-D echo on 04/01/2009 showed mild concentric hypertrophy and normal systolic (LVEF  08-65%) and doppler parameters  consistent with abnormal left    CVA (cerebral vascular accident) ([place]) 07/04/2012   MRI of the brain 07/04/2012 showed an acute infarct in the right basal ganglia involving the anterior putamen, anterior limb internal capsule, and head of the caudate; this measured approximately 2.5 cm in diameter.      Dermatitis    Diabetes mellitus    type 2   DIABETIC PERIPHERAL NEUROPATHY 08/03/2007   Qualifier: Diagnosis of  By: Marinda Elk MD, Sonia Side     DM neuropathy, painful ([place])    fingers and right knee   ESRD (end stage renal disease) ([place])    Stage 4, on hemodailysis x 4 months as of 05-18-18 [facility], tues thurs sat   ESRD (end stage renal disease) on dialysis ([place])    "TTS; [facility]" (05/31/2018)   Hearing loss in right ear    Hyperlipidemia    Hypertension    Hypertensive crisis 07/28/2012   Hypertensive urgency 08/20/2014   Myocardial infarction [facility])  many yrs ago   Nephrotic syndrome 02/18/2013   A 24-hour urine collection 03/04/2013 showed total protein of 5,460 g and creatinine clearance of 80 mL/minute.  Patient was seen by Seward Meth at [place] and a repeat 24-hour urine showed 10,407 mg protein.  Patient underwent kidney biopsy on 05/30/2013; pathology showed advanced diffuse and nodular diabetic nephropathy with vascular changes consistent with long-standing difficult to control hypertension.      Pneumonia 2014 and 2015   Renal insufficiency    Sleep apnea  11/06/20 - hasn't used it "in a while"   Review of Systems: Review of Systems  Constitutional:  Negative for chills and fever.  Respiratory:  Positive for cough and shortness of breath. Negative for wheezing.   Cardiovascular:  Positive for orthopnea. Negative for chest pain, palpitations and leg swelling.  Gastrointestinal:  Positive for diarrhea. Negative for abdominal pain, blood in stool, melena, nausea and vomiting.  Neurological:  Negative for dizziness, focal weakness,  weakness and headaches.   Physical Exam:  Vitals:   06/21/21 1551  BP: (!) 146/91  Pulse: 98  Temp: 97.6 F (36.4 C)  TempSrc: Oral  SpO2: 98%  Weight: 194 lb 6.4 oz (88.2 kg)  Height: 5' 9.5" (1.765 m)   Physical Exam Vitals and nursing note reviewed.  Constitutional:      General: He is not in acute distress.    Appearance: He is normal weight.  Eyes:     Extraocular Movements: Extraocular movements intact.     Pupils: Pupils are equal, round, and reactive to light.  Cardiovascular:     Rate and Rhythm: Normal rate and regular rhythm.  Pulmonary:     Effort: Pulmonary effort is normal. No respiratory distress.     Breath sounds: Rales (very minimal bibasilar rales) present. No wheezing or rhonchi.     Comments: Short inspiratory and expiratory phase Abdominal:     General: Bowel sounds are normal.  Musculoskeletal:     Right lower leg: No edema.     Left lower leg: No edema.  Skin:    General: Skin is warm and dry.  Neurological:     General: No focal deficit present.     Mental Status: He is alert and oriented to person, place, and time. Mental status is at baseline.     Gait: Gait normal.  Psychiatric:        Mood and Affect: Mood normal.        Behavior: Behavior normal.    Assessment & Plan:   See Encounters Tab for problem based charting.  Patient discussed with Dr. Heber Le Grand

## 2021-06-22 DIAGNOSIS — K529 Noninfective gastroenteritis and colitis, unspecified: Secondary | ICD-10-CM | POA: Insufficient documentation

## 2021-06-22 NOTE — Assessment & Plan Note (Signed)
Repeat sleep study 05/2021 with evidence of persistent severe OSA. CPAP recommendations listed. CPAP ordered several days ago with order pending.

## 2021-06-22 NOTE — Assessment & Plan Note (Signed)
Recent admission for acute HFrEF from 10/3 - 10/6. Patient presented with increased SOB and pulmonary edema on CXR. Repeat TTE showed stable LVEF however new RV reduced systolic function. Patient was diursed with Lasic and additional UF during HD. On admission, patient weighted 200.6 lbs (91 kg). On discharge, he weighed 194.4 lbs (88.2 kg).   Today, Mr. Helbing states he continues to have significant SOB and DOE that is worse than it was a few months ago. He did not feel diuresis changed his symptoms at all. He occasionally feels volume overloaded.   Assessment/Plan:  On examination, no evidence of rales or peripheral edema. Euvolemic overall. He is currently at his dry weight of 88 kg. Although not volume overloaded, symptoms concerning for NYHA class III/IV.   - No medication changes today: Continue Coreg 25 mg BID, Lasix 80 mg BID, Hydralazine 100 mg TID  - I strongly recommend Mr. Doten contact his primary cardiologist office for a follow up appointment.  - If BP allows, consider changing Hydralazine to Bidil in the future

## 2021-06-22 NOTE — Assessment & Plan Note (Addendum)
Charles Hart reports significant SOB and DOE that is worse than prior without significant changes to his health. He feels it started after he was accepted to the kidney transplant list. He is also experiencing a dry cough. He denies any fever, chills, sick contacts.   Assessment/Plan:  Patient's pulmonary examination is unremarkable with no wheezing, rales or rhonchi. No significant or new anemia on recent blood work.  I suspect this is multi-factorial in the setting of HFrEF (although euvolemic), untreated severe OSA and generalized physical deconditioning. We had a long conversation that his symptoms will not be quickly reversible. [name] had a hard time hearing this. Will work to individually address each contributing factor.

## 2021-06-22 NOTE — Assessment & Plan Note (Signed)
Charles Hart endorses a several week history of post-prandial diarrhea that occurs suddenly between 15-20 minutes after eating. Occasionally he does have episodes when he had not eaten. He denies any abdominal pain, nausea, vomiting, hematemesis, hematochezia and melena.   Assessment/Plan:  I suspect patient's symptoms are secondary to IBS, likely functional diarrhea.   - Recommended a FODMAP diet and PRN Imodium.

## 2021-06-23 ENCOUNTER — Encounter: Payer: 59 | Admitting: Internal Medicine

## 2021-06-28 ENCOUNTER — Emergency Department (HOSPITAL_COMMUNITY)
Admission: EM | Admit: 2021-06-28 | Discharge: 2021-06-28 | Disposition: A | Discharge status: Home / Self Care | Payer: 59 | Attending: Emergency Medicine | Admitting: Emergency Medicine

## 2021-06-28 ENCOUNTER — Encounter (HOSPITAL_COMMUNITY): Payer: Self-pay | Admitting: *Deleted

## 2021-06-28 ENCOUNTER — Other Ambulatory Visit: Payer: Self-pay

## 2021-06-28 ENCOUNTER — Emergency Department (HOSPITAL_COMMUNITY): Payer: 59

## 2021-06-28 DIAGNOSIS — Z20822 Contact with and (suspected) exposure to covid-19: Secondary | ICD-10-CM | POA: Diagnosis not present

## 2021-06-28 DIAGNOSIS — Z992 Dependence on renal dialysis: Secondary | ICD-10-CM | POA: Insufficient documentation

## 2021-06-28 DIAGNOSIS — R0602 Shortness of breath: Secondary | ICD-10-CM | POA: Diagnosis present

## 2021-06-28 DIAGNOSIS — I5042 Chronic combined systolic (congestive) and diastolic (congestive) heart failure: Secondary | ICD-10-CM | POA: Diagnosis not present

## 2021-06-28 DIAGNOSIS — I132 Hypertensive heart and chronic kidney disease with heart failure and with stage 5 chronic kidney disease, or end stage renal disease: Secondary | ICD-10-CM | POA: Diagnosis not present

## 2021-06-28 DIAGNOSIS — E1122 Type 2 diabetes mellitus with diabetic chronic kidney disease: Secondary | ICD-10-CM | POA: Insufficient documentation

## 2021-06-28 DIAGNOSIS — Z85828 Personal history of other malignant neoplasm of skin: Secondary | ICD-10-CM | POA: Diagnosis not present

## 2021-06-28 DIAGNOSIS — N186 End stage renal disease: Secondary | ICD-10-CM | POA: Diagnosis not present

## 2021-06-28 LAB — CBC WITH DIFFERENTIAL/PLATELET
Abs Immature Granulocytes: 0.02 10*3/uL (ref 0.00–0.07)
Basophils Absolute: 0.1 10*3/uL (ref 0.0–0.1)
Basophils Relative: 1 %
Eosinophils Absolute: 0.1 10*3/uL (ref 0.0–0.5)
Eosinophils Relative: 2 %
HCT: 32.8 % — ABNORMAL LOW (ref 39.0–52.0)
Hemoglobin: 10.3 g/dL — ABNORMAL LOW (ref 13.0–17.0)
Immature Granulocytes: 0 %
Lymphocytes Relative: 13 %
Lymphs Abs: 0.7 10*3/uL (ref 0.7–4.0)
MCH: 28.1 pg (ref 26.0–34.0)
MCHC: 31.4 g/dL (ref 30.0–36.0)
MCV: 89.4 fL (ref 80.0–100.0)
Monocytes Absolute: 0.6 10*3/uL (ref 0.1–1.0)
Monocytes Relative: 12 %
Neutro Abs: 4 10*3/uL (ref 1.7–7.7)
Neutrophils Relative %: 72 %
Platelets: 163 10*3/uL (ref 150–400)
RBC: 3.67 MIL/uL — ABNORMAL LOW (ref 4.22–5.81)
RDW: 15.7 % — ABNORMAL HIGH (ref 11.5–15.5)
WBC: 5.6 10*3/uL (ref 4.0–10.5)
nRBC: 0 % (ref 0.0–0.2)

## 2021-06-28 LAB — COMPREHENSIVE METABOLIC PANEL
ALT: 14 U/L (ref 0–44)
AST: 12 U/L — ABNORMAL LOW (ref 15–41)
Albumin: 3.4 g/dL — ABNORMAL LOW (ref 3.5–5.0)
Alkaline Phosphatase: 63 U/L (ref 38–126)
Anion gap: 15 (ref 5–15)
BUN: 56 mg/dL — ABNORMAL HIGH (ref 8–23)
CO2: 22 mmol/L (ref 22–32)
Calcium: 8.8 mg/dL — ABNORMAL LOW (ref 8.9–10.3)
Chloride: 100 mmol/L (ref 98–111)
Creatinine, Ser: 8.66 mg/dL — ABNORMAL HIGH (ref 0.61–1.24)
GFR, Estimated: 6 mL/min — ABNORMAL LOW (ref >60)
Glucose, Bld: 97 mg/dL (ref 70–99)
Potassium: 4.3 mmol/L (ref 3.5–5.1)
Sodium: 137 mmol/L (ref 135–145)
Total Bilirubin: 1.6 mg/dL — ABNORMAL HIGH (ref 0.3–1.2)
Total Protein: 6.5 g/dL (ref 6.5–8.1)

## 2021-06-28 LAB — BRAIN NATRIURETIC PEPTIDE: B Natriuretic Peptide: >4500 pg/mL — ABNORMAL HIGH (ref 0.0–100.0)

## 2021-06-28 LAB — RESP PANEL BY RT-PCR (FLU A&B, COVID) ARPGX2
Influenza A by PCR: NEGATIVE
Influenza B by PCR: NEGATIVE
SARS Coronavirus 2 by RT PCR: NEGATIVE

## 2021-06-28 LAB — MAGNESIUM: Magnesium: 2.3 mg/dL (ref 1.7–2.4)

## 2021-06-28 NOTE — ED Triage Notes (Signed)
C/o shortness of breath, states shortness of breath is due to dialysis treatment

## 2021-06-28 NOTE — ED Provider Notes (Signed)
Emergency Department Provider Note   I have reviewed the triage vital signs and the nursing notes.   HISTORY  Chief Complaint Shortness of Breath   HPI Charles Hart is a 65 y.o. male with PMH reviewed below including CHF, DM, prior CVA, and ESRD presents to the ED with SOB symptoms.  Patient last had dialysis on Saturday.  He states that they have been trying to reduce his dry weight which frustrates him and causes him to have some muscle cramping during his session.  He has been going to his sessions but has been feeling short of breath for "a while" but seemed to worsen this evening. Denies any CP. No fever. No cough. Denies abdominal pain. His SOB subjectively worsens when laying flat.    Past Medical History:  Diagnosis Date   Adenomatous colon polyp 07/02/2011   Last colonoscopy May 06, 2011 by Dr. Owens Loffler, who recommended repeat colonoscopy in 5 years.    Anemia    Background diabetic retinopathy 04/20/2012   Patient is followed by Dr. Katy Fitch    Cancer [facility])    Kidney   Cardiomyopathy    LV function improved from 2004 to 2008.  Historically, moderately dilated LV with EF 30-40% by 2D echo 08/14/2002.  Mild CAD with severe LV dysfunction by cardiac cath 09/2002.  Normal coronary arteries and normal LV function by cardiac cath 09/19/2006.  A 2-D echo on 04/01/2009 showed mild concentric hypertrophy and normal systolic (LVEF  44-31%) and doppler C/W with grade 1 diastolic dysfunction.   CHF (congestive heart failure) (HCC)    LV function improved from 2004 to 2008.  Historically, moderately dilated LV with EF 30-40% by 2D echo 08/14/2002.  Mild CAD with severe LV dysfunction by cardiac cath 09/2002.  Normal coronary arteries and normal LV function by cardiac cath 09/19/2006.  A 2-D echo on 04/01/2009 showed mild concentric hypertrophy and normal systolic (LVEF  54-00%) and doppler C/W with grade 1 diastolic dysfunction..    Chronic combined systolic and diastolic  congestive heart failure ([place]) 05/21/2010   LV function improved from 2004 to 2008.  Historically, moderately dilated LV with EF 30-40% by 2D echo 08/14/2002.  Mild CAD with severe LV dysfunction by cardiac cath 09/2002.  Normal coronary arteries and normal LV function by cardiac cath 09/19/2006.  A 2-D echo on 04/01/2009 showed mild concentric hypertrophy and normal systolic (LVEF  86-76%) and doppler parameters consistent with abnormal left    CVA (cerebral vascular accident) ([place]) 07/04/2012   MRI of the brain 07/04/2012 showed an acute infarct in the right basal ganglia involving the anterior putamen, anterior limb internal capsule, and head of the caudate; this measured approximately 2.5 cm in diameter.      Dermatitis    Diabetes mellitus    type 2   DIABETIC PERIPHERAL NEUROPATHY 08/03/2007   Qualifier: Diagnosis of  By: Marinda Elk MD, Sonia Side     DM neuropathy, painful ([place])    fingers and right knee   ESRD (end stage renal disease) ([place])    Stage 4, on hemodailysis x 4 months as of 05-18-18 [place] [facility], tues thurs sat   ESRD (end stage renal disease) on dialysis ([place])    "TTS; [facility]" (05/31/2018)   Hearing loss in right ear    Hyperlipidemia    Hypertension    Hypertensive crisis 07/28/2012   Hypertensive urgency 08/20/2014   Myocardial infarction [facility])  many yrs ago   Nephrotic syndrome 02/18/2013   A 24-hour urine  collection 03/04/2013 showed total protein of 5,460 g and creatinine clearance of 80 mL/minute.  Patient was seen by Seward Meth at [place] and a repeat 24-hour urine showed 10,407 mg protein.  Patient underwent kidney biopsy on 05/30/2013; pathology showed advanced diffuse and nodular diabetic nephropathy with vascular changes consistent with Law [place]-standing difficult to control hypertension.      Pneumonia 2014 and 2015   Renal insufficiency    Sleep apnea    11/06/20 - hasn't used it "in a while"   Surgical pneumoperitoneum     Trochanteric bursitis of right hip 04/25/2018    Patient Active Problem List   Diagnosis Date Noted   Postprandial diarrhea 06/22/2021   Shortness of breath 06/21/2021   Upper airway cough syndrome 03/26/2021   S/P hernia repair 11/09/2020   Ventral hernia without obstruction or gangrene 05/26/2020   Aortic atherosclerosis ([place]) 12/07/2017   ESRD on hemodialysis ([place]) 11/24/2017   S/P arteriovenous (AV) fistula creation 11/24/2017   DOE (dyspnea on exertion) 11/08/2017   History of renal cell carcinoma 09/06/2017   Secondary hyperparathyroidism, renal ([place]) 02/09/2016   Primary osteoarthritis of left knee 01/07/2016   Age-related nuclear cataract of both eyes 10/07/2015   Normocytic anemia 08/22/2014   Health care maintenance 05/16/2014   Nephrotic syndrome in diseases classified elsewhere 02/18/2013   Obstructive sleep apnea 07/26/2012   History of stroke 07/04/2012   Controlled type 2 diabetes mellitus with both eyes affected by proliferative retinopathy without macular edema, with Rickesha Veracruz-term current use of insulin ([place]) 04/20/2012   ED (erectile dysfunction) 04/18/2012   Benign neoplasm of colon 07/02/2011   Chronic combined systolic and diastolic congestive heart failure ([place]) 05/21/2010   Carotid bruit 04/01/2009   Type 2 diabetes mellitus with diabetic neuropathy ([place]) 08/03/2007   Hearing loss 07/30/2007   Controlled type 2 diabetes mellitus with chronic kidney disease ([place]) 09/15/2006   Other and unspecified hyperlipidemia 06/01/2006   Essential (primary) hypertension 06/01/2006    Past Surgical History:  Procedure Laterality Date   AV FISTULA PLACEMENT Left 11/24/2017   Procedure: INSERTION OF ARTERIOVENOUS (AV) GORE-TEX GRAFT LEFT LOWER ARM;  Surgeon: Rosetta Posner, MD;  Location: [place];  Service: Vascular;  Laterality: Left;   CARDIAC CATHETERIZATION      4 times   COLONOSCOPY     COLONOSCOPY WITH PROPOFOL N/A 06/18/2020   Procedure: COLONOSCOPY WITH PROPOFOL;   Surgeon: Milus Banister, MD;  Location: [facility];  Service: Endoscopy;  Laterality: N/A;   FOOT SURGERY     INCISIONAL HERNIA REPAIR N/A 11/09/2020   Procedure: OPEN INCISIONAL HERNIA REPAIR WITH MESH;  Surgeon: Ralene Ok, MD;  Location: [place];  Service: General;  Laterality: N/A;   INSERTION OF MESH N/A 11/09/2020   Procedure: INSERTION OF MESH;  Surgeon: Ralene Ok, MD;  Location[facility];  Service: General;  Laterality: N/A;   POLYPECTOMY     POLYPECTOMY  06/18/2020   Procedure: POLYPECTOMY;  Surgeon: Milus Banister, MD;  Location: [facility];  Service: Endoscopy;;   ROBOT ASSISTED LAPAROSCOPIC NEPHRECTOMY Left 05/30/2018   Procedure: XI ROBOTIC ASSISTED LAPAROSCOPIC NEPHRECTOMY;  Surgeon: Alexis Frock, MD;  Location: [facility];  Service: Urology;  Laterality: Left;    Allergies Ivp dye [iodinated diagnostic agents], Hydrocodone, Phentermine, Amlodipine, Lisinopril, and Red dye  Family History  Problem Relation Age of Onset   Aneurysm Father 64       died of rupture   Colon cancer Sister  Social History Social History   Tobacco Use   Smoking status: Never   Smokeless tobacco: Never  Vaping Use   Vaping Use: Never used  Substance Use Topics   Alcohol use: No    Alcohol/week: 0.0 standard drinks   Drug use: No    Review of Systems  Constitutional: No fever/chills Eyes: No visual changes. ENT: No sore throat. Cardiovascular: Denies chest pain. Respiratory: Positive shortness of breath. Gastrointestinal: No abdominal pain.  No nausea, no vomiting.  No diarrhea.  No constipation. Genitourinary: Negative for dysuria. Musculoskeletal: Negative for back pain. Skin: Negative for rash. Neurological: Negative for headaches, focal weakness or numbness.  10-point ROS otherwise negative.  ____________________________________________   PHYSICAL EXAM:  VITAL SIGNS: ED Triage Vitals  Enc Vitals Group     BP 06/28/21 1815 (!) 133/94     Pulse Rate  06/28/21 1815 (!) 106     Resp 06/28/21 1815 18     Temp 06/28/21 1815 98.9 F (37.2 C)     Temp Source 06/28/21 1815 Oral     SpO2 06/28/21 1815 100 %   Constitutional: Alert and oriented. Well appearing and in no acute distress. Eyes: Conjunctivae are normal. Head: Atraumatic. Nose: No congestion/rhinnorhea. Mouth/Throat: Mucous membranes are moist.   Neck: No stridor.  Cardiovascular: Mild tachycardia. Good peripheral circulation. Grossly normal heart sounds.   Respiratory: Normal respiratory effort.  No retractions. Lungs CTAB. Gastrointestinal: Soft and nontender. No distention.  Musculoskeletal: No lower extremity tenderness nor edema. No gross deformities of extremities. Neurologic:  Normal speech and language. No gross focal neurologic deficits are appreciated.  Skin:  Skin is warm, dry and intact. No rash noted.  ____________________________________________   LABS (all labs ordered are listed, but only abnormal results are displayed)  Labs Reviewed  COMPREHENSIVE METABOLIC PANEL - Abnormal; Notable for the following components:      Result Value   BUN 56 (*)    Creatinine, Ser 8.66 (*)    Calcium 8.8 (*)    Albumin 3.4 (*)    AST 12 (*)    Total Bilirubin 1.6 (*)    GFR, Estimated 6 (*)    All other components within normal limits  BRAIN NATRIURETIC PEPTIDE - Abnormal; Notable for the following components:   B Natriuretic Peptide >4,500.0 (*)    All other components within normal limits  CBC WITH DIFFERENTIAL/PLATELET - Abnormal; Notable for the following components:   RBC 3.67 (*)    Hemoglobin 10.3 (*)    HCT 32.8 (*)    RDW 15.7 (*)    All other components within normal limits  RESP PANEL BY RT-PCR (FLU A&B, COVID) ARPGX2  MAGNESIUM   ____________________________________________  EKG   EKG Interpretation  Date/Time:  Monday June 28 2021 18:22:31 EST Ventricular Rate:  109 PR Interval:  162 QRS Duration: 108 QT Interval:  362 QTC  Calculation: 487 R Axis:   22 Text Interpretation: Sinus tachycardia Left ventricular hypertrophy with repolarization abnormality ( R in aVL , Sokolow-Lyon ) Abnormal ECG Confirmed by Nanda Quinton [phone]) on 06/28/2021 6:47:15 PM        ____________________________________________  RADIOLOGY  DG Chest 2 View  Result Date: 06/28/2021 CLINICAL DATA:  Shortness of breath EXAM: CHEST - 2 VIEW COMPARISON:  05/17/2021 FINDINGS: Cardiomegaly. No confluent airspace opacity or effusion. No acute bony abnormality. IMPRESSION: Cardiomegaly.  No active disease. Electronically Signed   By: Rolm Baptise M.D.   On: 06/28/2021 18:46    ____________________________________________   PROCEDURES  Procedure(s) performed:   Procedures  None  ____________________________________________   INITIAL IMPRESSION / ASSESSMENT AND PLAN / ED COURSE  Pertinent labs & imaging results that were available during my care of the patient were reviewed by me and considered in my medical decision making (see chart for details).   Patient presents to the emergency department for evaluation of shortness of breath symptoms.  He has been compliant with his dialysis and reports a reduction in dry weight at his outpatient sessions.  He looks well here.  He is not having increased work of breathing or altered mental status.  On arrival, his oxygen saturation are 100%.  Plan for screening blood work to rule out the need for emergent dialysis.  Chest x-ray does not show severe pulmonary edema and this correlates with my exam.   10:45 AM  COVID and Flu PCR are negative. Patient is not having O2 desaturation here. BNP elevated as expected but no evidence of pulmonary edema on CXR or significant findings on exam to suggest this. Normal K and other electrolytes. Patient has HD first this tomorrow AM. He plans to make his HD session. Will return with any new/worsening symptoms. Discussed results and plan with the patient who is in  agreement.  ____________________________________________  FINAL CLINICAL IMPRESSION(S) / ED DIAGNOSES  Final diagnoses:  SOB (shortness of breath)     Note:  This document was prepared using Dragon voice recognition software and may include unintentional dictation errors.  Nanda Quinton, MD, [facility] Emergency Medicine    Lochlan Grygiel, [place], MD 06/28/21 2252

## 2021-06-28 NOTE — ED Notes (Signed)
Patient left ED with ABCs intact, alert and oriented x4, respirations even and unlabored. Discharge instructions reviewed and all questions answered.   

## 2021-06-28 NOTE — Discharge Instructions (Signed)
You were seen in the emergency room today after feeling short of breath.  Your lab work here is reassuring but you do need to make your dialysis session tomorrow morning.  Please be sure to make this appointment.  If you develop any new or suddenly worsening symptoms please call 911 and or return to the emergency department for reevaluation.

## 2021-06-28 NOTE — Progress Notes (Signed)
Internal Medicine Clinic Attending  Case discussed with Dr. Basaraba  At the time of the visit.  We reviewed the resident's history and exam and pertinent patient test results.  I agree with the assessment, diagnosis, and plan of care documented in the resident's note.  

## 2021-07-12 ENCOUNTER — Telehealth: Payer: Self-pay | Admitting: Cardiovascular Disease

## 2021-07-12 NOTE — Telephone Encounter (Signed)
New message    Pt walked in stating that he "needs some answers".  He stated that [facility] has taken him off the kidney transplant list because of low ejection fraction of 35%.  Dr Marval Regal is his kidney doctor.  Patient states that he has been told that his heart is "ok". Please advise.

## 2021-07-12 NOTE — Telephone Encounter (Signed)
Attempted phone call to pt to advise of Dr Kyla Balzarine comments.  Left voicemail message to contact triage at [phone].

## 2021-07-12 NOTE — Telephone Encounter (Signed)
Spoke with pt and advised Dr [name] and his nurse are not in the office today.  Pt advised will forward his questions to Dr [name] for review.  Pt states he was told by Dr [name] his heart was doing ok and he did not need to be seen again for a year and yet he has been removed again from the transplant list.  Discussed pt's history of CHF and EF.

## 2021-07-16 NOTE — Telephone Encounter (Signed)
Pt aware of the reasons that Dr [name] gave for denial to be kidney transplant recipient ./cy

## 2021-07-19 ENCOUNTER — Emergency Department (HOSPITAL_COMMUNITY): Payer: 59

## 2021-07-19 ENCOUNTER — Emergency Department (HOSPITAL_COMMUNITY)
Admission: EM | Admit: 2021-07-19 | Discharge: 2021-07-20 | Disposition: A | Discharge status: Home / Self Care | Payer: 59 | Attending: Emergency Medicine | Admitting: Emergency Medicine

## 2021-07-19 ENCOUNTER — Encounter (HOSPITAL_COMMUNITY): Payer: Self-pay | Admitting: *Deleted

## 2021-07-19 ENCOUNTER — Other Ambulatory Visit: Payer: Self-pay

## 2021-07-19 DIAGNOSIS — E1140 Type 2 diabetes mellitus with diabetic neuropathy, unspecified: Secondary | ICD-10-CM | POA: Insufficient documentation

## 2021-07-19 DIAGNOSIS — Z992 Dependence on renal dialysis: Secondary | ICD-10-CM | POA: Insufficient documentation

## 2021-07-19 DIAGNOSIS — I132 Hypertensive heart and chronic kidney disease with heart failure and with stage 5 chronic kidney disease, or end stage renal disease: Secondary | ICD-10-CM | POA: Diagnosis not present

## 2021-07-19 DIAGNOSIS — I2510 Atherosclerotic heart disease of native coronary artery without angina pectoris: Secondary | ICD-10-CM | POA: Insufficient documentation

## 2021-07-19 DIAGNOSIS — Z86018 Personal history of other benign neoplasm: Secondary | ICD-10-CM | POA: Insufficient documentation

## 2021-07-19 DIAGNOSIS — Z20822 Contact with and (suspected) exposure to covid-19: Secondary | ICD-10-CM | POA: Insufficient documentation

## 2021-07-19 DIAGNOSIS — Z79899 Other long term (current) drug therapy: Secondary | ICD-10-CM | POA: Diagnosis not present

## 2021-07-19 DIAGNOSIS — I5042 Chronic combined systolic (congestive) and diastolic (congestive) heart failure: Secondary | ICD-10-CM | POA: Insufficient documentation

## 2021-07-19 DIAGNOSIS — R0602 Shortness of breath: Secondary | ICD-10-CM | POA: Insufficient documentation

## 2021-07-19 DIAGNOSIS — N186 End stage renal disease: Secondary | ICD-10-CM | POA: Insufficient documentation

## 2021-07-19 DIAGNOSIS — J90 Pleural effusion, not elsewhere classified: Secondary | ICD-10-CM | POA: Insufficient documentation

## 2021-07-19 DIAGNOSIS — Z760 Encounter for issue of repeat prescription: Secondary | ICD-10-CM

## 2021-07-19 DIAGNOSIS — R197 Diarrhea, unspecified: Secondary | ICD-10-CM | POA: Diagnosis not present

## 2021-07-19 DIAGNOSIS — Z85528 Personal history of other malignant neoplasm of kidney: Secondary | ICD-10-CM | POA: Insufficient documentation

## 2021-07-19 DIAGNOSIS — E1122 Type 2 diabetes mellitus with diabetic chronic kidney disease: Secondary | ICD-10-CM | POA: Insufficient documentation

## 2021-07-19 DIAGNOSIS — E113593 Type 2 diabetes mellitus with proliferative diabetic retinopathy without macular edema, bilateral: Secondary | ICD-10-CM | POA: Diagnosis not present

## 2021-07-19 LAB — CBC WITH DIFFERENTIAL/PLATELET
Abs Immature Granulocytes: 0.02 10*3/uL (ref 0.00–0.07)
Basophils Absolute: 0.1 10*3/uL (ref 0.0–0.1)
Basophils Relative: 1 %
Eosinophils Absolute: 0.2 10*3/uL (ref 0.0–0.5)
Eosinophils Relative: 4 %
HCT: 37.4 % — ABNORMAL LOW (ref 39.0–52.0)
Hemoglobin: 11.5 g/dL — ABNORMAL LOW (ref 13.0–17.0)
Immature Granulocytes: 0 %
Lymphocytes Relative: 17 %
Lymphs Abs: 0.8 10*3/uL (ref 0.7–4.0)
MCH: 28 pg (ref 26.0–34.0)
MCHC: 30.7 g/dL (ref 30.0–36.0)
MCV: 91.2 fL (ref 80.0–100.0)
Monocytes Absolute: 0.6 10*3/uL (ref 0.1–1.0)
Monocytes Relative: 12 %
Neutro Abs: 3.1 10*3/uL (ref 1.7–7.7)
Neutrophils Relative %: 66 %
Platelets: 162 10*3/uL (ref 150–400)
RBC: 4.1 MIL/uL — ABNORMAL LOW (ref 4.22–5.81)
RDW: 16.4 % — ABNORMAL HIGH (ref 11.5–15.5)
WBC: 4.6 10*3/uL (ref 4.0–10.5)
nRBC: 0 % (ref 0.0–0.2)

## 2021-07-19 LAB — TROPONIN I (HIGH SENSITIVITY): Troponin I (High Sensitivity): 49 ng/L — ABNORMAL HIGH (ref <18)

## 2021-07-19 LAB — RESP PANEL BY RT-PCR (FLU A&B, COVID) ARPGX2
Influenza A by PCR: NEGATIVE
Influenza B by PCR: NEGATIVE
SARS Coronavirus 2 by RT PCR: NEGATIVE

## 2021-07-19 LAB — BASIC METABOLIC PANEL
Anion gap: 12 (ref 5–15)
BUN: 33 mg/dL — ABNORMAL HIGH (ref 8–23)
CO2: 26 mmol/L (ref 22–32)
Calcium: 8.9 mg/dL (ref 8.9–10.3)
Chloride: 98 mmol/L (ref 98–111)
Creatinine, Ser: 8.17 mg/dL — ABNORMAL HIGH (ref 0.61–1.24)
GFR, Estimated: 7 mL/min — ABNORMAL LOW (ref >60)
Glucose, Bld: 112 mg/dL — ABNORMAL HIGH (ref 70–99)
Potassium: 4.3 mmol/L (ref 3.5–5.1)
Sodium: 136 mmol/L (ref 135–145)

## 2021-07-19 NOTE — ED Provider Notes (Signed)
[facility] Provider Note   CSN: [id number] Arrival date & time: 07/19/21  1507     History Chief Complaint  Patient presents with   Shortness of Breath    Charles Hart is a 65 y.o. male with a history significant for cardiomyopathy including CHF,, CVA, type 2 diabetes, end-stage renal disease on dialysis, Tuesday Thursday Saturday who has not missed any treatments, sleep apnea using a CPAP machine presenting for evaluation of shortness of breath.  He states he has been having shortness of breath for the past several months which seems to be fairly persistent without known triggers.  He has observed however that the symptoms started shortly after he was advised that he was excepted on the renal transplant list and wonders if his symptoms are anxiety related due to this potential upcoming surgery.  He denies chest pain, pleuritic pain, fevers or chills, he has had no significant cough, he also denies peripheral edema.  He does endorse chronic orthopnea which is not worsened.  He states that dialysis has lowered his dry weight which he does not like as it causes some muscle cramping when they take off too much fluid.  He has found no alleviators for his symptoms.  The history is provided by the patient.      Past Medical History:  Diagnosis Date   Adenomatous colon polyp 07/02/2011   Last colonoscopy May 06, 2011 by Dr. Owens Loffler, who recommended repeat colonoscopy in 5 years.    Anemia    Background diabetic retinopathy 04/20/2012   Patient is followed by Dr. Katy Fitch    Cancer [facility])    Kidney   Cardiomyopathy    LV function improved from 2004 to 2008.  Historically, moderately dilated LV with EF 30-40% by 2D echo 08/14/2002.  Mild CAD with severe LV dysfunction by cardiac cath 09/2002.  Normal coronary arteries and normal LV function by cardiac cath 09/19/2006.  A 2-D echo on 04/01/2009 showed mild concentric hypertrophy and normal systolic (LVEF  17-00%) and  doppler C/W with grade 1 diastolic dysfunction.   CHF (congestive heart failure) (HCC)    LV function improved from 2004 to 2008.  Historically, moderately dilated LV with EF 30-40% by 2D echo 08/14/2002.  Mild CAD with severe LV dysfunction by cardiac cath 09/2002.  Normal coronary arteries and normal LV function by cardiac cath 09/19/2006.  A 2-D echo on 04/01/2009 showed mild concentric hypertrophy and normal systolic (LVEF  17-49%) and doppler C/W with grade 1 diastolic dysfunction..    Chronic combined systolic and diastolic congestive heart failure ([place]) 05/21/2010   LV function improved from 2004 to 2008.  Historically, moderately dilated LV with EF 30-40% by 2D echo 08/14/2002.  Mild CAD with severe LV dysfunction by cardiac cath 09/2002.  Normal coronary arteries and normal LV function by cardiac cath 09/19/2006.  A 2-D echo on 04/01/2009 showed mild concentric hypertrophy and normal systolic (LVEF  44-96%) and doppler parameters consistent with abnormal left    CVA (cerebral vascular accident) ([place]) 07/04/2012   MRI of the brain 07/04/2012 showed an acute infarct in the right basal ganglia involving the anterior putamen, anterior limb internal capsule, and head of the caudate; this measured approximately 2.5 cm in diameter.      Dermatitis    Diabetes mellitus    type 2   DIABETIC PERIPHERAL NEUROPATHY 08/03/2007   Qualifier: Diagnosis of  By: Marinda Elk MD, Sonia Side     DM neuropathy, painful ([place])  fingers and right knee   ESRD (end stage renal disease) ([place])    Stage 4, on hemodailysis x 4 months as of 05-18-18 [place] [facility], tues thurs sat   ESRD (end stage renal disease) on dialysis ([place])    "TTS; [facility]" (05/31/2018)   Hearing loss in right ear    Hyperlipidemia    Hypertension    Hypertensive crisis 07/28/2012   Hypertensive urgency 08/20/2014   Myocardial infarction [facility])  many yrs ago   Nephrotic syndrome 02/18/2013   A 24-hour urine collection 03/04/2013 showed total protein of  5,460 g and creatinine clearance of 80 mL/minute.  Patient was seen by Seward Meth at [place] and a repeat 24-hour urine showed 10,407 mg protein.  Patient underwent kidney biopsy on 05/30/2013; pathology showed advanced diffuse and nodular diabetic nephropathy with vascular changes consistent with long-standing difficult to control hypertension.      Pneumonia 2014 and 2015   Renal insufficiency    Sleep apnea    11/06/20 - hasn't used it "in a while"   Surgical pneumoperitoneum    Trochanteric bursitis of right hip 04/25/2018    Patient Active Problem List   Diagnosis Date Noted   Postprandial diarrhea 06/22/2021   Shortness of breath 06/21/2021   Upper airway cough syndrome 03/26/2021   S/P hernia repair 11/09/2020   Ventral hernia without obstruction or gangrene 05/26/2020   Aortic atherosclerosis ([place]) 12/07/2017   ESRD on hemodialysis ([place]) 11/24/2017   S/P arteriovenous (AV) fistula creation 11/24/2017   DOE (dyspnea on exertion) 11/08/2017   History of renal cell carcinoma 09/06/2017   Secondary hyperparathyroidism, renal ([place]) 02/09/2016   Primary osteoarthritis of left knee 01/07/2016   Age-related nuclear cataract of both eyes 10/07/2015   Normocytic anemia 08/22/2014   Health care maintenance 05/16/2014   Nephrotic syndrome in diseases classified elsewhere 02/18/2013   Obstructive sleep apnea 07/26/2012   History of stroke 07/04/2012   Controlled type 2 diabetes mellitus with both eyes affected by proliferative retinopathy without macular edema, with long-term current use of insulin ([place]) 04/20/2012   ED (erectile dysfunction) 04/18/2012   Benign neoplasm of colon 07/02/2011   Chronic combined systolic and diastolic congestive heart failure ([place]) 05/21/2010   Carotid bruit 04/01/2009   Type 2 diabetes mellitus with diabetic neuropathy ([place]) 08/03/2007   Hearing loss 07/30/2007   Controlled type 2 diabetes mellitus with chronic kidney  disease ([place]) 09/15/2006   Other and unspecified hyperlipidemia 06/01/2006   Essential (primary) hypertension 06/01/2006    Past Surgical History:  Procedure Laterality Date   AV FISTULA PLACEMENT Left 11/24/2017   Procedure: INSERTION OF ARTERIOVENOUS (AV) GORE-TEX GRAFT LEFT LOWER ARM;  Surgeon: Rosetta Posner, MD;  Location: [facility];  Service: Vascular;  Laterality: Left;   CARDIAC CATHETERIZATION      4 times   COLONOSCOPY     COLONOSCOPY WITH PROPOFOL N/A 06/18/2020   Procedure: COLONOSCOPY WITH PROPOFOL;  Surgeon: Milus Banister, MD;  Location: [facility];  Service: Endoscopy;  Laterality: N/A;   FOOT SURGERY     INCISIONAL HERNIA REPAIR N/A 11/09/2020   Procedure: OPEN INCISIONAL HERNIA REPAIR WITH MESH;  Surgeon: Ralene Ok, MD;  Location: [facility];  Service: General;  Laterality: N/A;   INSERTION OF MESH N/A 11/09/2020   Procedure: INSERTION OF MESH;  Surgeon: Ralene Ok, MD;  Location: [facility];  Service: General;  Laterality: N/A;   POLYPECTOMY     POLYPECTOMY  06/18/2020   Procedure: POLYPECTOMY;  Surgeon: Milus Banister, MD;  Location: [facility];  Service: Endoscopy;;   ROBOT ASSISTED LAPAROSCOPIC NEPHRECTOMY Left 05/30/2018   Procedure: XI ROBOTIC ASSISTED LAPAROSCOPIC NEPHRECTOMY;  Surgeon: Alexis Frock, MD;  Location: [facility];  Service: Urology;  Laterality: Left;       Family History  Problem Relation Age of Onset   Aneurysm Father 8       died of rupture   Colon cancer Sister     Social History   Tobacco Use   Smoking status: Never   Smokeless tobacco: Never  Vaping Use   Vaping Use: Never used  Substance Use Topics   Alcohol use: No    Alcohol/week: 0.0 standard drinks   Drug use: No    Home Medications Prior to Admission medications   Medication Sig Start Date End Date Taking? Authorizing Provider  acetaminophen (TYLENOL) 325 MG tablet Take 2 tablets (650 mg total) by mouth every 6 (six) hours as needed for mild pain. 11/18/20   Norm Parcel, PA-C  calcium acetate (PHOSLO) 667 MG capsule Take 2,001 mg by mouth 3 (three) times daily with meals.  12/15/17   Tye Savoy, MD  carvedilol (COREG) 25 MG tablet Take 1 tablet (25 mg total) by mouth 2 (two) times daily with a meal. 09/27/18   Lucious Groves, DO  famotidine (PEPCID) 20 MG tablet One after supper Patient taking differently: Take 20 mg by mouth daily after supper. 03/26/21   Tanda Rockers, MD  fluticasone (FLONASE) 50 MCG/ACT nasal spray Place 2 sprays into both nostrils as needed for allergies. 09/27/18   Lucious Groves, DO  folic acid-vitamin b complex-vitamin c-selenium-zinc (DIALYVITE) 3 MG TABS tablet Take 1 tablet by mouth daily.    [provider]  furosemide (LASIX) 80 MG tablet Take 1 tablet (80 mg total) by mouth 2 (two) times daily. 01/28/20   Lucious Groves, DO  gabapentin (NEURONTIN) 300 MG capsule Take 1 capsule (300 mg total) by mouth at bedtime. 07/20/21  Yes Breken Nazari, Almyra Free, PA-C  hydrALAZINE (APRESOLINE) 100 MG tablet Take 1 tablet (100 mg total) by mouth 3 (three) times daily. 07/20/21 08/19/21 Yes Jordyn Doane, Almyra Free, PA-C  meclizine (ANTIVERT) 12.5 MG tablet Take 1 tablet (12.5 mg total) by mouth 3 (three) times daily as needed for dizziness. 12/12/20   Long, Wonda Olds, MD  pantoprazole (PROTONIX) 40 MG tablet Take 1 tablet (40 mg total) by mouth daily. 03/26/21   Tanda Rockers, MD  pravastatin (PRAVACHOL) 40 MG tablet Take 1 tablet (40 mg total) by mouth daily. Patient taking differently: Take 40 mg by mouth at bedtime. 01/28/20   Lucious Groves, DO  SUPER B COMPLEX/C PO Take 1 tablet by mouth daily.    [provider]    Allergies    Ivp dye [iodinated diagnostic agents], Hydrocodone, Phentermine, Amlodipine, Lisinopril, and Red dye  Review of Systems   Review of Systems  Constitutional:  Negative for chills and fever.  HENT:  Negative for congestion and sore throat.   Eyes: Negative.   Respiratory:  Positive for shortness of breath.  Negative for chest tightness.   Cardiovascular:  Negative for chest pain, palpitations and leg swelling.  Gastrointestinal:  Positive for diarrhea. Negative for abdominal pain, nausea and vomiting.  Genitourinary: Negative.   Musculoskeletal:  Negative for arthralgias, joint swelling and neck pain.  Skin: Negative.  Negative for rash and wound.  Neurological:  Negative for dizziness, weakness, light-headedness, numbness and headaches.  Psychiatric/Behavioral:  Negative.    All other systems reviewed and are negative.  Physical Exam Updated Vital Signs BP (!) 148/95   Pulse 100   Temp 98.2 F (36.8 C)   Resp 18   SpO2 100%   Physical Exam Vitals and nursing note reviewed.  Constitutional:      Appearance: He is well-developed.  HENT:     Head: Normocephalic and atraumatic.  Eyes:     Conjunctiva/sclera: Conjunctivae normal.  Cardiovascular:     Rate and Rhythm: Normal rate and regular rhythm.     Heart sounds: Normal heart sounds.  Pulmonary:     Effort: Pulmonary effort is normal.     Breath sounds: Examination of the right-lower field reveals rales. Rales present. No decreased breath sounds, wheezing or rhonchi.  Abdominal:     General: Bowel sounds are normal.     Palpations: Abdomen is soft.     Tenderness: There is no abdominal tenderness. There is no guarding.  Musculoskeletal:        General: Normal range of motion.     Cervical back: Normal range of motion.     Right lower leg: No edema.     Left lower leg: No edema.  Skin:    General: Skin is warm and dry.  Neurological:     Mental Status: He is alert.    ED Results / Procedures / Treatments   Labs (all labs ordered are listed, but only abnormal results are displayed) Labs Reviewed  CBC WITH DIFFERENTIAL/PLATELET - Abnormal; Notable for the following components:      Result Value   RBC 4.10 (*)    Hemoglobin 11.5 (*)    HCT 37.4 (*)    RDW 16.4 (*)    All other components within normal limits  BASIC  METABOLIC PANEL - Abnormal; Notable for the following components:   Glucose, Bld 112 (*)    BUN 33 (*)    Creatinine, Ser 8.17 (*)    GFR, Estimated 7 (*)    All other components within normal limits  TROPONIN I (HIGH SENSITIVITY) - Abnormal; Notable for the following components:   Troponin I (High Sensitivity) 49 (*)    All other components within normal limits  RESP PANEL BY RT-PCR (FLU A&B, COVID) [id number]    EKG EKG Interpretation  Date/Time:  Monday July 19 2021 16:03:51 EST Ventricular Rate:  105 PR Interval:  160 QRS Duration: 112 QT Interval:  362 QTC Calculation: 478 R Axis:   29 Text Interpretation: Sinus tachycardia Left ventricular hypertrophy with repolarization abnormality ( R in aVL ) Abnormal ECG since last tracing no significant change Confirmed by Daleen Bo [phone]) on 07/20/2021 12:06:17 AM  Radiology DG Chest 2 View  Result Date: 07/19/2021 CLINICAL DATA:  Shortness of breath. Congestive heart failure. End-stage renal disease. EXAM: CHEST - 2 VIEW COMPARISON:  Two-view chest x-ray 06/28/2021 FINDINGS: Heart is enlarged. Lung volumes are low. Mild pulmonary vascular congestion is slightly increased from prior exam. No focal airspace disease is present. A small right pleural effusion is suspected. Axial skeleton is unremarkable. IMPRESSION: Cardiomegaly with mild pulmonary vascular congestion and small right pleural effusion, compatible with congestive heart failure. Electronically Signed   By: San Morelle M.D.   On: 07/19/2021 17:58    Procedures Procedures   Medications Ordered in ED Medications  loperamide (IMODIUM) capsule 4 mg (4 mg Oral Given 07/20/21 0018)    ED Course  I have reviewed the triage vital signs and the nursing  notes.  Pertinent labs & imaging results that were available during my care of the patient were reviewed by me and considered in my medical decision making (see chart for details).    MDM Rules/Calculators/A&P                            Labs and imaging reviewed and discussed with patient.  He does have a mild pleural effusion, but his vital signs are stable here with the 100% oxygen saturation rate, no tachypnea or respiratory distress.  He does have dialysis tomorrow morning which should resolve this finding.  Prior to discharge he also mentioned to attending that he is having diarrhea, this was not mentioned earlier in his ED visit.  He was given a dose of Imodium here.  No h/o PE, no extremity pain or edema, no chest pain or pleuritic symptoms.  Doubt PE.  His troponin is low today in comparison to prior troponins.  Delta troponin not indicated given chronicity of his symptoms.  Also is out of his gabapentin and hydralazine, refills given.    Final Clinical Impression(s) / ED Diagnoses Final diagnoses:  Shortness of breath  Medication refill    Rx / DC Orders ED Discharge Orders          Ordered    gabapentin (NEURONTIN) 300 MG capsule  Daily at bedtime        07/20/21 0035    hydrALAZINE (APRESOLINE) 100 MG tablet  3 times daily        07/20/21 0035             Evalee Jefferson, PA-C 07/20/21 0050    Daleen Bo, MD 07/20/21 [phone]

## 2021-07-19 NOTE — ED Triage Notes (Signed)
Shortness of breath

## 2021-07-20 ENCOUNTER — Emergency Department (HOSPITAL_COMMUNITY): Payer: 59

## 2021-07-20 ENCOUNTER — Other Ambulatory Visit: Payer: Self-pay

## 2021-07-20 ENCOUNTER — Encounter (HOSPITAL_COMMUNITY): Payer: Self-pay

## 2021-07-20 ENCOUNTER — Emergency Department (HOSPITAL_COMMUNITY)
Admission: EM | Admit: 2021-07-20 | Discharge: 2021-07-20 | Disposition: A | Discharge status: Home / Self Care | Payer: 59 | Source: Home / Self Care | Attending: Emergency Medicine | Admitting: Emergency Medicine

## 2021-07-20 DIAGNOSIS — Z7984 Long term (current) use of oral hypoglycemic drugs: Secondary | ICD-10-CM | POA: Insufficient documentation

## 2021-07-20 DIAGNOSIS — Z7951 Long term (current) use of inhaled steroids: Secondary | ICD-10-CM | POA: Insufficient documentation

## 2021-07-20 DIAGNOSIS — E1142 Type 2 diabetes mellitus with diabetic polyneuropathy: Secondary | ICD-10-CM | POA: Insufficient documentation

## 2021-07-20 DIAGNOSIS — R0602 Shortness of breath: Secondary | ICD-10-CM | POA: Insufficient documentation

## 2021-07-20 DIAGNOSIS — N186 End stage renal disease: Secondary | ICD-10-CM | POA: Insufficient documentation

## 2021-07-20 DIAGNOSIS — Z992 Dependence on renal dialysis: Secondary | ICD-10-CM | POA: Insufficient documentation

## 2021-07-20 DIAGNOSIS — I132 Hypertensive heart and chronic kidney disease with heart failure and with stage 5 chronic kidney disease, or end stage renal disease: Secondary | ICD-10-CM | POA: Insufficient documentation

## 2021-07-20 DIAGNOSIS — Z79899 Other long term (current) drug therapy: Secondary | ICD-10-CM | POA: Insufficient documentation

## 2021-07-20 DIAGNOSIS — Z85528 Personal history of other malignant neoplasm of kidney: Secondary | ICD-10-CM | POA: Insufficient documentation

## 2021-07-20 DIAGNOSIS — I5042 Chronic combined systolic (congestive) and diastolic (congestive) heart failure: Secondary | ICD-10-CM | POA: Insufficient documentation

## 2021-07-20 DIAGNOSIS — E1122 Type 2 diabetes mellitus with diabetic chronic kidney disease: Secondary | ICD-10-CM | POA: Insufficient documentation

## 2021-07-20 LAB — CBC
HCT: 35.4 % — ABNORMAL LOW (ref 39.0–52.0)
Hemoglobin: 11 g/dL — ABNORMAL LOW (ref 13.0–17.0)
MCH: 27.7 pg (ref 26.0–34.0)
MCHC: 31.1 g/dL (ref 30.0–36.0)
MCV: 89.2 fL (ref 80.0–100.0)
Platelets: 154 10*3/uL (ref 150–400)
RBC: 3.97 MIL/uL — ABNORMAL LOW (ref 4.22–5.81)
RDW: 16.3 % — ABNORMAL HIGH (ref 11.5–15.5)
WBC: 4.8 10*3/uL (ref 4.0–10.5)
nRBC: 0 % (ref 0.0–0.2)

## 2021-07-20 LAB — BASIC METABOLIC PANEL
Anion gap: 13 (ref 5–15)
BUN: 39 mg/dL — ABNORMAL HIGH (ref 8–23)
CO2: 22 mmol/L (ref 22–32)
Calcium: 9.1 mg/dL (ref 8.9–10.3)
Chloride: 101 mmol/L (ref 98–111)
Creatinine, Ser: 9.13 mg/dL — ABNORMAL HIGH (ref 0.61–1.24)
GFR, Estimated: 6 mL/min — ABNORMAL LOW (ref >60)
Glucose, Bld: 110 mg/dL — ABNORMAL HIGH (ref 70–99)
Potassium: 4.3 mmol/L (ref 3.5–5.1)
Sodium: 136 mmol/L (ref 135–145)

## 2021-07-20 LAB — BRAIN NATRIURETIC PEPTIDE: B Natriuretic Peptide: >4500 pg/mL — ABNORMAL HIGH (ref 0.0–100.0)

## 2021-07-20 LAB — TROPONIN I (HIGH SENSITIVITY): Troponin I (High Sensitivity): 65 ng/L — ABNORMAL HIGH (ref <18)

## 2021-07-20 MED ORDER — HYDRALAZINE HCL 100 MG PO TABS: 100.0000 mg | ORAL_TABLET | Freq: Three times a day (TID) | ORAL | 0 refills | Status: DC

## 2021-07-20 MED ORDER — LOPERAMIDE HCL 2 MG PO CAPS
2.0000 mg | ORAL_CAPSULE | Freq: Once | ORAL | Status: AC
  Administered 2021-07-20: 2 mg via ORAL
  Filled 2021-07-20: qty 1

## 2021-07-20 MED ORDER — LOPERAMIDE HCL 2 MG PO CAPS
4.0000 mg | ORAL_CAPSULE | ORAL | Status: DC | PRN
  Administered 2021-07-20: 4 mg via ORAL
  Filled 2021-07-20: qty 2

## 2021-07-20 MED ORDER — IOHEXOL 350 MG/ML SOLN
80.0000 mL | Freq: Once | INTRAVENOUS | Status: AC | PRN
  Administered 2021-07-20: 80 mL via INTRAVENOUS

## 2021-07-20 MED ORDER — GABAPENTIN 300 MG PO CAPS: 300.0000 mg | ORAL_CAPSULE | Freq: Every day | ORAL | 0 refills | Status: DC

## 2021-07-20 NOTE — Discharge Instructions (Addendum)
Proceed to dialysis in the morning, your work-up including your chest x-ray and your lab tests tonight are reassuring.  You have been given a dose of Imodium which should help with your diarrhea.  I recommend additional dosages if this first dose does not resolve your symptoms.  You do not need a prescription for this medication.  You have been given refill prescriptions of your hydralazine and your gabapentin.  Continue your other home medications.

## 2021-07-20 NOTE — ED Provider Notes (Signed)
  Face-to-face evaluation   History: He complains of shortness of breath, diarrhea, general weakness.  He states he is out of his hydralazine and gabapentin.  He is otherwise taking his usual medications.  He denies fever, chills, chest pain, focal weakness or paresthesia  Physical exam: Alert, calm, cooperative.  No acute distress.  Nontoxic appearance.  Lungs with good airflow bilaterally no wheezes rales or rhonchi.  Heart regular rate and rhythm without murmur.  Abdomen soft and nontender.  Medical screening examination/treatment/procedure(s) were conducted as a shared visit with non-physician practitioner(s) and myself.  I personally evaluated the patient during the encounter    Daleen Bo, MD 07/20/21 1232

## 2021-07-20 NOTE — ED Provider Notes (Signed)
Care assumed from PA Suella Broad at shift change, please see her note for full details, but in brief Charles Hart is a 65 y.o. male presents for shortness of breath that has been present for 5 to 6 months.  Symptoms are unchanged today, he was seen in the ED at [facility] yesterday for the symptoms and had reassuring work-up and was told to follow-up for his regularly scheduled dialysis today.  Decided to skip dialysis today to come in for further evaluation because patient was concerned he could have a PE.  Lab work today is reassuring, does not suggest that patient needs emergent dialysis, renal coordinator has already orchestrated dialysis at the patient's center for tomorrow.  PE's study is pending at shift change and if this is negative patient will be discharged home to follow-up for dialysis as planned.  BP (!) 154/102   Pulse 98   Temp 98.6 F (37 C) (Oral)   Resp (!) 29   SpO2 96%    ED Course/Procedures   Labs Reviewed  BASIC METABOLIC PANEL - Abnormal; Notable for the following components:      Result Value   Glucose, Bld 110 (*)    BUN 39 (*)    Creatinine, Ser 9.13 (*)    GFR, Estimated 6 (*)    All other components within normal limits  CBC - Abnormal; Notable for the following components:   RBC 3.97 (*)    Hemoglobin 11.0 (*)    HCT 35.4 (*)    RDW 16.3 (*)    All other components within normal limits  BRAIN NATRIURETIC PEPTIDE - Abnormal; Notable for the following components:   B Natriuretic Peptide >4,500.0 (*)    All other components within normal limits  TROPONIN I (HIGH SENSITIVITY) - Abnormal; Notable for the following components:   Troponin I (High Sensitivity) 65 (*)    All other components within normal limits   DG Chest 2 View  Result Date: 07/20/2021 CLINICAL DATA:  SOB EXAM: CHEST - 2 VIEW COMPARISON:  07/19/2021. FINDINGS: Enlarged cardiac silhouette. Pulmonary vascular congestion. No focal consolidation. No visible pleural effusions or  pneumothorax. No acute osseous abnormality. IMPRESSION: Cardiomegaly and pulmonary vascular congestion without overt pulmonary edema. Electronically Signed   By: Margaretha Sheffield M.D.   On: 07/20/2021 13:39   CT Angio Chest PE W/Cm &/Or Wo Cm  Result Date: 07/20/2021 CLINICAL DATA:  Shortness of breath EXAM: CT ANGIOGRAPHY CHEST WITH CONTRAST TECHNIQUE: Multidetector CT imaging of the chest was performed using the standard protocol during bolus administration of intravenous contrast. Multiplanar CT image reconstructions and MIPs were obtained to evaluate the vascular anatomy. CONTRAST:  40mL OMNIPAQUE IOHEXOL 350 MG/ML SOLN COMPARISON:  Chest x-ray 07/20/2021, CT chest 11/08/2017 FINDINGS: Cardiovascular: Satisfactory opacification of the pulmonary arteries to the segmental level. No evidence of pulmonary embolism. Cardiomegaly. No pericardial effusion. Nonaneurysmal aorta. Mild aortic atherosclerosis. Dilated pulmonary trunk up to 3.8 cm. Coronary vascular calcification Mediastinum/Nodes: Midline trachea. No thyroid mass. Mildly prominent AP window lymph node measuring 11 mm. Esophagus within normal limits. Lungs/Pleura: Small right pleural effusion. Diffuse hazy lung density possible edema. No pneumothorax. Upper Abdomen: No acute abnormality. Small amount of ascites in the upper quadrants Musculoskeletal: No chest wall abnormality. No acute or significant osseous findings. Review of the MIP images confirms the above findings. IMPRESSION: 1. Negative for acute pulmonary embolus. 2. Cardiomegaly with small right pleural effusion. Hazy bilateral lung density, suspect mild pulmonary edema. 3. Small amount of abdominal ascites 4.  Dilated pulmonary trunk, possible arterial hypertension Aortic Atherosclerosis (ICD10-I70.0). Electronically Signed   By: Donavan Foil M.D.   On: 07/20/2021 18:24      Procedures  MDM   CTA with no evidence of pulmonary embolus.  Patient with cardiomegaly with small right  pleural effusion and hazy bilateral lung densities in the bases that is likely mild pulmonary edema but with this patient is not hypoxic.  Likely because he needs dialysis but this does not need to be done emergently based on his lab work and the rest of his evaluation today.  Dialysis is already been coordinated for the patient tomorrow and he should report to his dialysis center.  Discussed results and plan with patient.  He expresses understanding and agreement.  Requests a dose of Imodium which he usually takes at home prior to discharge.   At this time there does not appear to be any evidence of an acute emergency medical condition and the patient appears stable for discharge with appropriate outpatient follow up.Diagnosis was discussed with patient who verbalizes understanding and is agreeable to discharge.         Jacqlyn Larsen, PA-C 07/20/21 1832    Valarie Merino, MD 07/21/21 2328

## 2021-07-20 NOTE — Progress Notes (Signed)
Received a call from Mickel Baas, ED PA, with request that pt receive out-pt HD treatment tomorrow at out-pt clinic due to missed treatment today. Spoke to [place], Therapist, sports at [employer] who states pt can receive treatment tomorrow and for pt to call her once d/c from ED for appt time. Spoke to pt via phone to make him aware of this info. Pt to call clinic for time once d/c from ED. Spoke to [place], ED PA to provide update.   Melven Sartorius Renal Navigator [phone]

## 2021-07-20 NOTE — Discharge Instructions (Signed)
Call your dialysis center for an appointment tomorrow. Follow-up with your doctor for further work-up of your shortness of breath.

## 2021-07-20 NOTE — ED Triage Notes (Signed)
Patient  complains of ongoing SOB for some time. Was seen at [facility] ED last night and was told he may have PE. Was not scanned for same. Was to have dialysis today and did not go. Denies pain. Alert and oriented

## 2021-07-20 NOTE — ED Notes (Signed)
PT transported to CT>

## 2021-07-20 NOTE — ED Provider Notes (Signed)
[facility] EMERGENCY DEPARTMENT Provider Note   CSN: [id number] Arrival date & time: 07/20/21  1052     History No chief complaint on file.   Charles Hart is a 65 y.o. male.  65 year old male, history of end-stage renal disease (Tuesday, Thursday, Saturday, last full dialysis was Saturday), additional history listed below.  Patient presents today with complaint of shortness of breath which has been ongoing for the past 5 to 6 months, it is no worse than his usual.  Also states he had some diarrhea yesterday patient went to the [facility] emergency room yesterday, was given Imodium and advised to follow-up with his dialysis center today for his treatment.  Patient is here today stating he is concerned he has a PE.  No history of prior PE or DVT, no lower extremity edema, no changes in the baseline shortness of breath he has been experiencing for the past 5 to 6 months.  No other complaints or concerns.  Patient skipped dialysis today to come to the emergency room for this evaluation.      Past Medical History:  Diagnosis Date   Adenomatous colon polyp 07/02/2011   Last colonoscopy May 06, 2011 by Dr. Owens Loffler, who recommended repeat colonoscopy in 5 years.    Anemia    Background diabetic retinopathy 04/20/2012   Patient is followed by Dr. Katy Fitch    Cancer [facility])    Kidney   Cardiomyopathy    LV function improved from 2004 to 2008.  Historically, moderately dilated LV with EF 30-40% by 2D echo 08/14/2002.  Mild CAD with severe LV dysfunction by cardiac cath 09/2002.  Normal coronary arteries and normal LV function by cardiac cath 09/19/2006.  A 2-D echo on 04/01/2009 showed mild concentric hypertrophy and normal systolic (LVEF  32-99%) and doppler C/W with grade 1 diastolic dysfunction.   CHF (congestive heart failure) (HCC)    LV function improved from 2004 to 2008.  Historically, moderately dilated LV with EF 30-40% by 2D echo 08/14/2002.  Mild CAD with  severe LV dysfunction by cardiac cath 09/2002.  Normal coronary arteries and normal LV function by cardiac cath 09/19/2006.  A 2-D echo on 04/01/2009 showed mild concentric hypertrophy and normal systolic (LVEF  24-26%) and doppler C/W with grade 1 diastolic dysfunction..    Chronic combined systolic and diastolic congestive heart failure ([place]) 05/21/2010   LV function improved from 2004 to 2008.  Historically, moderately dilated LV with EF 30-40% by 2D echo 08/14/2002.  Mild CAD with severe LV dysfunction by cardiac cath 09/2002.  Normal coronary arteries and normal LV function by cardiac cath 09/19/2006.  A 2-D echo on 04/01/2009 showed mild concentric hypertrophy and normal systolic (LVEF  83-41%) and doppler parameters consistent with abnormal left    CVA (cerebral vascular accident) ([place]) 07/04/2012   MRI of the brain 07/04/2012 showed an acute infarct in the right basal ganglia involving the anterior putamen, anterior limb internal capsule, and head of the caudate; this measured approximately 2.5 cm in diameter.      Dermatitis    Diabetes mellitus    type 2   DIABETIC PERIPHERAL NEUROPATHY 08/03/2007   Qualifier: Diagnosis of  By: Marinda Elk MD, Sonia Side     DM neuropathy, painful ([place])    fingers and right knee   ESRD (end stage renal disease) ([place])    Stage 4, on hemodailysis x 4 months as of 05-18-18 [facility], tues thurs sat   ESRD (end stage renal disease)  on dialysis [facility])    "TTS; [facility]" (05/31/2018)   Hearing loss in right ear    Hyperlipidemia    Hypertension    Hypertensive crisis 07/28/2012   Hypertensive urgency 08/20/2014   Myocardial infarction [facility])  many yrs ago   Nephrotic syndrome 02/18/2013   A 24-hour urine collection 03/04/2013 showed total protein of 5,460 g and creatinine clearance of 80 mL/minute.  Patient was seen by Seward Meth at [facility] and a repeat 24-hour urine showed 10,407 mg protein.  Patient underwent kidney biopsy on  05/30/2013; pathology showed advanced diffuse and nodular diabetic nephropathy with vascular changes consistent with long-standing difficult to control hypertension.      Pneumonia 2014 and 2015   Renal insufficiency    Sleep apnea    11/06/20 - hasn't used it "in a while"   Surgical pneumoperitoneum    Trochanteric bursitis of right hip 04/25/2018    Patient Active Problem List   Diagnosis Date Noted   Postprandial diarrhea 06/22/2021   Shortness of breath 06/21/2021   Upper airway cough syndrome 03/26/2021   S/P hernia repair 11/09/2020   Ventral hernia without obstruction or gangrene 05/26/2020   Aortic atherosclerosis ([place]) 12/07/2017   ESRD on hemodialysis ([place]) 11/24/2017   S/P arteriovenous (AV) fistula creation 11/24/2017   DOE (dyspnea on exertion) 11/08/2017   History of renal cell carcinoma 09/06/2017   Secondary hyperparathyroidism, renal ([place]) 02/09/2016   Primary osteoarthritis of left knee 01/07/2016   Age-related nuclear cataract of both eyes 10/07/2015   Normocytic anemia 08/22/2014   Health care maintenance 05/16/2014   Nephrotic syndrome in diseases classified elsewhere 02/18/2013   Obstructive sleep apnea 07/26/2012   History of stroke 07/04/2012   Controlled type 2 diabetes mellitus with both eyes affected by proliferative retinopathy without macular edema, with long-term current use of insulin ([place]) 04/20/2012   ED (erectile dysfunction) 04/18/2012   Benign neoplasm of colon 07/02/2011   Chronic combined systolic and diastolic congestive heart failure ([place]) 05/21/2010   Carotid bruit 04/01/2009   Type 2 diabetes mellitus with diabetic neuropathy ([place]) 08/03/2007   Hearing loss 07/30/2007   Controlled type 2 diabetes mellitus with chronic kidney disease ([place]) 09/15/2006   Other and unspecified hyperlipidemia 06/01/2006   Essential (primary) hypertension 06/01/2006    Past Surgical History:  Procedure Laterality Date   AV FISTULA PLACEMENT Left 11/24/2017    Procedure: INSERTION OF ARTERIOVENOUS (AV) GORE-TEX GRAFT LEFT LOWER ARM;  Surgeon: Rosetta Posner, MD;  Location: [facility];  Service: Vascular;  Laterality: Left;   CARDIAC CATHETERIZATION      4 times   COLONOSCOPY     COLONOSCOPY WITH PROPOFOL N/A 06/18/2020   Procedure: COLONOSCOPY WITH PROPOFOL;  Surgeon: Milus Banister, MD;  Location: [facility];  Service: Endoscopy;  Laterality: N/A;   FOOT SURGERY     INCISIONAL HERNIA REPAIR N/A 11/09/2020   Procedure: OPEN INCISIONAL HERNIA REPAIR WITH MESH;  Surgeon: Ralene Ok, MD;  Location: [facility];  Service: General;  Laterality: N/A;   INSERTION OF MESH N/A 11/09/2020   Procedure: INSERTION OF MESH;  Surgeon: Ralene Ok, MD;  Location: [facility] OR;  Service: General;  Laterality: N/A;   POLYPECTOMY     POLYPECTOMY  06/18/2020   Procedure: POLYPECTOMY;  Surgeon: Milus Banister, MD;  Location: [facility];  Service: Endoscopy;;   ROBOT ASSISTED LAPAROSCOPIC NEPHRECTOMY Left 05/30/2018   Procedure: XI ROBOTIC ASSISTED LAPAROSCOPIC NEPHRECTOMY;  Surgeon: Alexis Frock, MD;  Location: [facility];  Service: Urology;  Laterality: Left;       Family History  Problem Relation Age of Onset   Aneurysm Father 67       died of rupture   Colon cancer Sister     Social History   Tobacco Use   Smoking status: Never   Smokeless tobacco: Never  Vaping Use   Vaping Use: Never used  Substance Use Topics   Alcohol use: No    Alcohol/week: 0.0 standard drinks   Drug use: No    Home Medications Prior to Admission medications   Medication Sig Start Date End Date Taking? Authorizing Provider  acetaminophen (TYLENOL) 325 MG tablet Take 2 tablets (650 mg total) by mouth every 6 (six) hours as needed for mild pain. 11/18/20  Yes Norm Parcel, PA-C  calcium acetate (PHOSLO) 667 MG capsule Take 2,001 mg by mouth 3 (three) times daily with meals.  12/15/17  Yes Ziebach, Caren Griffins, MD  carvedilol (COREG) 25 MG tablet Take 1 tablet (25 mg total) by mouth  2 (two) times daily with a meal. 09/27/18  Yes Lucious Groves, DO  fluticasone (FLONASE) 50 MCG/ACT nasal spray Place 2 sprays into both nostrils as needed for allergies. Patient taking differently: Place 2 sprays into both nostrils daily as needed for allergies. 09/27/18  Yes Lucious Groves, DO  folic acid-vitamin b complex-vitamin c-selenium-zinc (DIALYVITE) 3 MG TABS tablet Take 1 tablet by mouth daily.   Yes [provider]  furosemide (LASIX) 80 MG tablet Take 1 tablet (80 mg total) by mouth 2 (two) times daily. Patient taking differently: Take 80 mg by mouth every other day. 01/28/20  Yes Lucious Groves, DO  gabapentin (NEURONTIN) 300 MG capsule Take 1 capsule (300 mg total) by mouth at bedtime. 07/20/21  Yes Idol, Almyra Free, PA-C  hydrALAZINE (APRESOLINE) 100 MG tablet Take 1 tablet (100 mg total) by mouth 3 (three) times daily. 07/20/21 08/19/21 Yes Idol, Almyra Free, PA-C  meclizine (ANTIVERT) 12.5 MG tablet Take 1 tablet (12.5 mg total) by mouth 3 (three) times daily as needed for dizziness. 12/12/20  Yes Long, Wonda Olds, MD  pravastatin (PRAVACHOL) 40 MG tablet Take 1 tablet (40 mg total) by mouth daily. Patient taking differently: Take 40 mg by mouth at bedtime. 01/28/20  Yes Lucious Groves, DO  SUPER B COMPLEX/C PO Take 1 tablet by mouth daily.   Yes [provider]    Allergies    Ivp dye [iodinated diagnostic agents], Hydrocodone, Phentermine, Amlodipine, Lisinopril, and Red dye  Review of Systems   Review of Systems  Constitutional:  Negative for chills and fever.  Respiratory:  Positive for shortness of breath.   Cardiovascular:  Negative for chest pain, palpitations and leg swelling.  Gastrointestinal:  Positive for diarrhea. Negative for abdominal pain, constipation, nausea and vomiting.  Musculoskeletal:  Negative for arthralgias and myalgias.  Skin:  Negative for rash and wound.  Allergic/Immunologic: Positive for immunocompromised state.  Neurological:  Negative for  weakness.  Hematological:  Negative for adenopathy.  Psychiatric/Behavioral:  Negative for confusion.   All other systems reviewed and are negative.  Physical Exam Updated Vital Signs BP (!) 153/92   Pulse (!) 103   Temp 98.6 F (37 C) (Oral)   Resp 20   SpO2 93%   Physical Exam Vitals and nursing note reviewed.  Constitutional:      General: He is not in acute distress.    Appearance: He is well-developed. He is not diaphoretic.  HENT:  Head: Normocephalic and atraumatic.  Cardiovascular:     Rate and Rhythm: Normal rate and regular rhythm.     Heart sounds: Normal heart sounds.  Pulmonary:     Effort: Pulmonary effort is normal.     Breath sounds: Normal breath sounds.  Abdominal:     Palpations: Abdomen is soft.     Tenderness: There is no abdominal tenderness.  Musculoskeletal:     Right lower leg: No edema.     Left lower leg: No edema.  Skin:    General: Skin is warm and dry.     Findings: No erythema or rash.  Neurological:     Mental Status: He is alert and oriented to person, place, and time.  Psychiatric:        Behavior: Behavior normal.    ED Results / Procedures / Treatments   Labs (all labs ordered are listed, but only abnormal results are displayed) Labs Reviewed  BASIC METABOLIC PANEL - Abnormal; Notable for the following components:      Result Value   Glucose, Bld 110 (*)    BUN 39 (*)    Creatinine, Ser 9.13 (*)    GFR, Estimated 6 (*)    All other components within normal limits  CBC - Abnormal; Notable for the following components:   RBC 3.97 (*)    Hemoglobin 11.0 (*)    HCT 35.4 (*)    RDW 16.3 (*)    All other components within normal limits  BRAIN NATRIURETIC PEPTIDE - Abnormal; Notable for the following components:   B Natriuretic Peptide >4,500.0 (*)    All other components within normal limits  TROPONIN I (HIGH SENSITIVITY) - Abnormal; Notable for the following components:   Troponin I (High Sensitivity) 65 (*)    All  other components within normal limits    EKG EKG Interpretation  Date/Time:  Tuesday July 20 2021 11:53:29 EST Ventricular Rate:  102 PR Interval:  162 QRS Duration: 114 QT Interval:  376 QTC Calculation: 490 R Axis:   4 Text Interpretation: Sinus tachycardia with occasional Premature ventricular complexes Left ventricular hypertrophy ( R in aVL , Sokolow-Lyon , Cornell product ) Confirmed by Lennice Sites ([id number]) on 07/20/2021 12:07:33 PM  Radiology DG Chest 2 View  Result Date: 07/20/2021 CLINICAL DATA:  SOB EXAM: CHEST - 2 VIEW COMPARISON:  07/19/2021. FINDINGS: Enlarged cardiac silhouette. Pulmonary vascular congestion. No focal consolidation. No visible pleural effusions or pneumothorax. No acute osseous abnormality. IMPRESSION: Cardiomegaly and pulmonary vascular congestion without overt pulmonary edema. Electronically Signed   By: Margaretha Sheffield M.D.   On: 07/20/2021 13:39   DG Chest 2 View  Result Date: 07/19/2021 CLINICAL DATA:  Shortness of breath. Congestive heart failure. End-stage renal disease. EXAM: CHEST - 2 VIEW COMPARISON:  Two-view chest x-ray 06/28/2021 FINDINGS: Heart is enlarged. Lung volumes are low. Mild pulmonary vascular congestion is slightly increased from prior exam. No focal airspace disease is present. A small right pleural effusion is suspected. Axial skeleton is unremarkable. IMPRESSION: Cardiomegaly with mild pulmonary vascular congestion and small right pleural effusion, compatible with congestive heart failure. Electronically Signed   By: San Morelle M.D.   On: 07/19/2021 17:58    Procedures Procedures   Medications Ordered in ED Medications - No data to display  ED Course  I have reviewed the triage vital signs and the nursing notes.  Pertinent labs & imaging results that were available during my care of the patient were reviewed by me and considered  in my medical decision making (see chart for details).  Clinical Course as of  07/20/21 1453  Tue Jul 20, 5564  [id number] 65 year old male on dialysis with shortness of breath x5 to 6 months.  Patient's symptoms are unchanged today.  He was seen in the ER yesterday and advised to follow-up for his regularly scheduled dialysis today however patient has come to the emergency room requesting PE study and his dialysis.  He reports he feels at his baseline otherwise with no changes in his chronic baseline complaints today.  He is well-appearing on exam, his lungs are clear to auscultation, he has no lower extremity edema.  No complaints of chest pain today.  No history of PE. Patient's troponin is 65 today, slightly elevated compared to yesterday, likely due to his chronic renal disease.  BMP with normal potassium, CBC without significant changes.  Patient is mildly tachycardic with a heart rate of 103 at triage, O2 sat 96% on room air.  Patient has a contrast listed as an allergy however this is from before he was a dialysis patient, states that his nephrologist told him not to have contrast previously. Plan is for CT chest for further evaluations of patient's of shortness of breath.  Discussed with dialysis coordinator who has arranged for patient to have his dialysis tomorrow at his regular clinic, patient is to contact his clinic to schedule an appointment time. [LM]  [id number] Care signed out to oncoming provider at change of shift pending CT chest to evaluate for patient's concern for PE.  If negative, patient may be discharged to attend dialysis tomorrow. [LM]    Clinical Course User Index [LM] Roque Lias   MDM Rules/Calculators/A&P                           Final Clinical Impression(s) / ED Diagnoses Final diagnoses:  Shortness of breath    Rx / DC Orders ED Discharge Orders     None        Tacy Learn, PA-C 07/20/21 1453    Lennice Sites, DO 07/20/21 1507

## 2021-07-20 NOTE — ED Provider Notes (Signed)
Emergency Medicine Provider Triage Evaluation Note  Charles Hart , a 65 y.o. male  was evaluated in triage.  Pt complains of shortness of breath over the past 5 to 6 months.  Denies any chest pain.  No fevers.  Last dialysis was Saturday for full session.  States he does not make much urine anymore but does make some.  Denies any pain nausea or vomiting.  Was seen at [facility] yesterday, no new symptoms today  Review of Systems  Positive: SOB Negative: Fever  Physical Exam  BP 134/90 (BP Location: Right Arm)   Pulse (!) 103   Temp 98.6 F (37 C) (Oral)   Resp 16   SpO2 96%  Gen:   Awake, no distress   Resp:  Normal effort  MSK:   Moves extremities without difficulty  Other:  Faint crackles in bilateral bases.  Lower extremities without any calf tenderness.  Medical Decision Making  Medically screening exam initiated at 11:46 AM.  Appropriate orders placed.  [name] was informed that the remainder of the evaluation will be completed by another provider, this initial triage assessment does not replace that evaluation, and the importance of remaining in the ED until their evaluation is complete.  Labs and x-ray ordered.   Charles Hart [place], [name] 07/20/21 1147    Wyvonnia Dusky, MD 07/20/21 1622

## 2021-08-12 ENCOUNTER — Telehealth: Payer: Self-pay | Admitting: *Deleted

## 2021-08-12 NOTE — Telephone Encounter (Signed)
Call from patient who spoke with front desk-stated is having problems with CPAP machine.  Is making him short of breath.. Had to be placed on Oxygen during his dialysis treatment.  Unable to speak with patient.  Message to call Clinics today.

## 2021-08-19 ENCOUNTER — Ambulatory Visit (INDEPENDENT_AMBULATORY_CARE_PROVIDER_SITE_OTHER): Payer: 59 | Admitting: Internal Medicine

## 2021-08-19 ENCOUNTER — Encounter: Payer: Self-pay | Admitting: Internal Medicine

## 2021-08-19 ENCOUNTER — Other Ambulatory Visit: Payer: Self-pay

## 2021-08-19 DIAGNOSIS — I5042 Chronic combined systolic (congestive) and diastolic (congestive) heart failure: Secondary | ICD-10-CM | POA: Diagnosis not present

## 2021-08-19 DIAGNOSIS — G4733 Obstructive sleep apnea (adult) (pediatric): Secondary | ICD-10-CM | POA: Diagnosis not present

## 2021-08-19 DIAGNOSIS — Z992 Dependence on renal dialysis: Secondary | ICD-10-CM

## 2021-08-19 DIAGNOSIS — R0609 Other forms of dyspnea: Secondary | ICD-10-CM | POA: Diagnosis not present

## 2021-08-19 DIAGNOSIS — N186 End stage renal disease: Secondary | ICD-10-CM

## 2021-08-19 NOTE — Progress Notes (Signed)
Pt's O2 sat with ambulation is 93%.

## 2021-08-19 NOTE — Patient Instructions (Signed)
I would like you to follow up with Dr Melvyn Novas (Lung Specialist) for your shortness of breath.  You also need to bring your CPAP card to [facility] to see what they may be able to change to see if it can work better for you, their address is  [address] [place], [place] [address] Tel [phone]

## 2021-08-19 NOTE — Telephone Encounter (Signed)
furosemide (LASIX) 80 MG tablet  gabapentin (NEURONTIN) 300 MG capsule, REFILL REQUEST @ [id number] - [place], [place] - [address] AT [place] [address]

## 2021-08-20 MED ORDER — FUROSEMIDE 80 MG PO TABS: 80.0000 mg | ORAL_TABLET | ORAL | 1 refills | Status: DC

## 2021-08-20 MED ORDER — GABAPENTIN 300 MG PO CAPS: 300.0000 mg | ORAL_CAPSULE | Freq: Every day | ORAL | 1 refills | Status: DC

## 2021-08-23 ENCOUNTER — Telehealth: Payer: Self-pay

## 2021-08-23 NOTE — Assessment & Plan Note (Signed)
He presents today requesting supplemental oxygen therapy.  He reports to me that at hemodialysis supplemental oxygen is placed and makes him feel better so that he can sleep through the session.  He was told to follow-up with me so that I may order it for him.  On review he did see pulmonology/Dr. Melvyn Novas in October for chronic cough as well as dyspnea on exertion Dr. Leonides Schanz felt that this was likely a combination of deconditioning and mild heart failure.  At that time he was noted to walk 450 feet at a fast pace and lowest O2 saturation was 97%.  In our office he was ambulated with a O2 nadir of 93%.  I discussed with [place] that I did not think that supplemental oxygen was necessary based on the values that we are receiving but the Dr. Melvyn Novas had encouraged him to follow-up as needed and I recommend him following up with pulmonology.

## 2021-08-23 NOTE — Assessment & Plan Note (Signed)
He is continuing with hemodialysis.  He is requesting some FMLA paperwork but is not too specific on what he needs.    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  He notes that he is usually able to take breaks when he needs to however that it is a long distance from the rest area to the spot that he works.

## 2021-08-23 NOTE — Assessment & Plan Note (Signed)
Overall he appears fairly euvolemic his volume is managed by hemodialysis.  Has some mild lower extremity edema.  He does complain about dyspnea on exertion please see that separate problem.

## 2021-08-23 NOTE — Telephone Encounter (Signed)
NOTES SCANNED TO REFERRAL 

## 2021-08-23 NOTE — Progress Notes (Signed)
°  Subjective:  HPI: Charles Hart is a 66 y.o. male who presents for dyspnea, OSA, FMLA  Please see Assessment and Plan below for the status of his chronic medical problems.  Objective:  Physical Exam: Vitals:   08/19/21 1035  BP: 140/77  Pulse: 93  Temp: 97.8 F (36.6 C)  TempSrc: Oral  SpO2: 100%  Weight: 193 lb 9.6 oz (87.8 kg)  Height: 6' (1.829 m)   Body mass index is 26.26 kg/m. Physical Exam Constitutional:      Appearance: Normal appearance.  Cardiovascular:     Rate and Rhythm: Normal rate and regular rhythm.     Pulses: Normal pulses.  Pulmonary:     Effort: Pulmonary effort is normal.     Breath sounds: No wheezing.     Comments: Mild bibasilar crackles Musculoskeletal:     Right lower leg: Edema (1+) present.     Left lower leg: Edema (1+) present.  Neurological:     Mental Status: He is alert.   Assessment & Plan:  See Encounters Tab for problem based charting.  Medications Ordered No orders of the defined types were placed in this encounter.  Other Orders No orders of the defined types were placed in this encounter.  Follow Up: Return in about 3 months (around 11/17/2021).

## 2021-08-23 NOTE — Assessment & Plan Note (Signed)
He was supposed to follow-up today so that I could confirm adherence to his CPAP therapy.  However he tells me that he has not been able to tolerate CPAP therapy.  He notes that when he puts on his mask he feels comfortable for may be 30 minutes and then has to take off the mask.  He cannot give me an estimate on how many days he has tried to use the mask.  I did call [facility].  He received his CPAP therapy on 1128 on 12 2 there was a mask fitting and he had a good seal.  The representative I spoke with said that she was not able to see his current usage and that it did not appear to be uploading into their system recommended he come by with his CPAP card to be downloaded.  I advised him of this and discussed the importance of treatment for his severe OSA especially as it relates to decreasing the risk of stroke and heart attack.

## 2021-09-16 NOTE — Progress Notes (Signed)
Cardiology Office Note:    Date:  09/23/2021   ID:  Charles Hart, DOB Jan 24, 1956, MRN [id number]  PCP:  Lucious Groves, DO  Cardiologist:  [name], MD  Referring MD: Donato Heinz, MD   No chief complaint on file.   History of Present Illness:    Charles Hart is a 66 y.o. male with a past medical history significant for poorly controlled hypertension complicated by renal failure and dialysis as well as CVA in 2013.  He is noted to have nonischemic dilated cardiomyopathy with normal cath in 2012 in 2013.  He went through divorce in 2019.  He had renal cell carcinoma found on CT scan in 04/2018 and had left radical nephrectomy with no cardiac complications on 25/42/7062.  He gets hemodialysis on Tuesday/Thursday/Saturday.  He was turned down at [facility] for renal transplant.  He had indicated that 1 of his children may try to provide a kidney. However I suspect with his age and DCM he would not be a canddiate   Echocardiogram on 05/18/21 EF 30-35% mild/moderate MR  Not totally anuric and takes lasix Not on ARB/ACE/Entresto due to renal failure  Has severe OSA on CPAP Dr Azzie Roup concern for ongoing dyspnea and orthopnea with dialysis trying to lower EDW Mentioned needing home oxygen but sats ok and Dr Heber [place] suggested f/u with pulmonary Dr Melvyn Novas Only tolerates CPAP for about 30 minutes     Seen in ED 07/20/21 dyspnea that has been going on for months CXR mild cephalization CTA negative for PE  small effusion ? Ascites and dilated MPA BNP > 4500 Troponin 65 Hct 35.4 K 4.3 Cr 9.13 Respiratory panel negative   He seems devastated about being taken off transplant list at [facility] He says his sats are ok at dialysis but he feels better on oxygen  Discussed right and left heart cath with him but he refuses at this time   Past Medical History:  Diagnosis Date   Adenomatous colon polyp 07/02/2011   Last colonoscopy May 06, 2011 by Dr. Owens Loffler, who recommended repeat  colonoscopy in 5 years.    Anemia    Background diabetic retinopathy 04/20/2012   Patient is followed by Dr. Katy Fitch    Cancer [facility])    Kidney   Cardiomyopathy    LV function improved from 2004 to 2008.  Historically, moderately dilated LV with EF 30-40% by 2D echo 08/14/2002.  Mild CAD with severe LV dysfunction by cardiac cath 09/2002.  Normal coronary arteries and normal LV function by cardiac cath 09/19/2006.  A 2-D echo on 04/01/2009 showed mild concentric hypertrophy and normal systolic (LVEF  37-62%) and doppler C/W with grade 1 diastolic dysfunction.   CHF (congestive heart failure) (HCC)    LV function improved from 2004 to 2008.  Historically, moderately dilated LV with EF 30-40% by 2D echo 08/14/2002.  Mild CAD with severe LV dysfunction by cardiac cath 09/2002.  Normal coronary arteries and normal LV function by cardiac cath 09/19/2006.  A 2-D echo on 04/01/2009 showed mild concentric hypertrophy and normal systolic (LVEF  83-15%) and doppler C/W with grade 1 diastolic dysfunction..    Chronic combined systolic and diastolic congestive heart failure ([place]) 05/21/2010   LV function improved from 2004 to 2008.  Historically, moderately dilated LV with EF 30-40% by 2D echo 08/14/2002.  Mild CAD with severe LV dysfunction by cardiac cath 09/2002.  Normal coronary arteries and normal LV function by cardiac cath 09/19/2006.  A 2-D echo on  04/01/2009 showed mild concentric hypertrophy and normal systolic (LVEF  95-62%) and doppler parameters consistent with abnormal left    CVA (cerebral vascular accident) ([place]) 07/04/2012   MRI of the brain 07/04/2012 showed an acute infarct in the right basal ganglia involving the anterior putamen, anterior limb internal capsule, and head of the caudate; this measured approximately 2.5 cm in diameter.      Dermatitis    Diabetes mellitus    type 2   DIABETIC PERIPHERAL NEUROPATHY 08/03/2007   Qualifier: Diagnosis of  By: Marinda Elk MD, Sonia Side     DM neuropathy, painful ([place])     fingers and right knee   ESRD (end stage renal disease) ([place])    Stage 4, on hemodailysis x 4 months as of 05-18-18 [place] [facility], tues thurs sat   ESRD (end stage renal disease) on dialysis ([place])    "TTS; [facility]" (05/31/2018)   Hearing loss in right ear    Hyperlipidemia    Hypertension    Hypertensive crisis 07/28/2012   Hypertensive urgency 08/20/2014   Myocardial infarction [facility])  many yrs ago   Nephrotic syndrome 02/18/2013   A 24-hour urine collection 03/04/2013 showed total protein of 5,460 g and creatinine clearance of 80 mL/minute.  Patient was seen by Seward Meth at [place] and a repeat 24-hour urine showed 10,407 mg protein.  Patient underwent kidney biopsy on 05/30/2013; pathology showed advanced diffuse and nodular diabetic nephropathy with vascular changes consistent with long-standing difficult to control hypertension.      Pneumonia 2014 and 2015   Renal insufficiency    Sleep apnea    11/06/20 - hasn't used it "in a while"   Surgical pneumoperitoneum    Trochanteric bursitis of right hip 04/25/2018    Past Surgical History:  Procedure Laterality Date   AV FISTULA PLACEMENT Left 11/24/2017   Procedure: INSERTION OF ARTERIOVENOUS (AV) GORE-TEX GRAFT LEFT LOWER ARM;  Surgeon: Rosetta Posner, MD;  Location: [place];  Service: Vascular;  Laterality: Left;   CARDIAC CATHETERIZATION      4 times   COLONOSCOPY     COLONOSCOPY WITH PROPOFOL N/A 06/18/2020   Procedure: COLONOSCOPY WITH PROPOFOL;  Surgeon: Milus Banister, MD;  Location: [facility];  Service: Endoscopy;  Laterality: N/A;   FOOT SURGERY     INCISIONAL HERNIA REPAIR N/A 11/09/2020   Procedure: OPEN INCISIONAL HERNIA REPAIR WITH MESH;  Surgeon: Ralene Ok, MD;  Location: [place];  Service: General;  Laterality: N/A;   INSERTION OF MESH N/A 11/09/2020   Procedure: INSERTION OF MESH;  Surgeon: Ralene Ok, MD;  Location[facility] OR;  Service: General;  Laterality: N/A;    POLYPECTOMY     POLYPECTOMY  06/18/2020   Procedure: POLYPECTOMY;  Surgeon: Milus Banister, MD;  Location: [facility];  Service: Endoscopy;;   ROBOT ASSISTED LAPAROSCOPIC NEPHRECTOMY Left 05/30/2018   Procedure: XI ROBOTIC ASSISTED LAPAROSCOPIC NEPHRECTOMY;  Surgeon: Alexis Frock, MD;  Location: [facility] ORS;  Service: Urology;  Laterality: Left;    Current Medications: No outpatient medications have been marked as taking for the 09/23/21 encounter (Appointment) with Josue Hector, MD.     Allergies:   Ivp dye [iodinated contrast media], Hydrocodone, Phentermine, Amlodipine, Lisinopril, and Red dye   Social History   Socioeconomic History   Marital status: Divorced    Spouse name: Not on file   Number of children: Not on file   Years of education: Not on file   Highest education level: Not  on file  Occupational History   Not on file  Tobacco Use   Smoking status: Never   Smokeless tobacco: Never  Vaping Use   Vaping Use: Never used  Substance and Sexual Activity   Alcohol use: No    Alcohol/week: 0.0 standard drinks   Drug use: No   Sexual activity: Not on file  Other Topics Concern   Not on file  Social History Narrative   Not on file   Social Determinants of Health   Financial Resource Strain: Not on file  Food Insecurity: Not on file  Transportation Needs: Not on file  Physical Activity: Not on file  Stress: Not on file  Social Connections: Not on file     Family History: The patient's family history includes Aneurysm (age of onset: 49) in his father; Colon cancer in his sister. ROS:   Please see the history of present illness.     All other systems reviewed and are negative.  EKGs/Labs/Other Studies Reviewed:    The following studies were reviewed today:  Echocardiogram 02/06/2019 IMPRESSIONS   1. The left ventricle has moderate-severely reduced systolic function, with an ejection fraction of 30-35%. The cavity size was mildly dilated. There is moderate  concentric left ventricular hypertrophy. Left ventricular diastolic Doppler parameters are  consistent with impaired relaxation. Indeterminate filling pressures Left ventrical global hypokinesis without regional wall motion abnormalities.  2. The right ventricle has normal systolic function. The cavity was normal. There is no increase in right ventricular wall thickness.  3. The mitral valve is grossly normal. There is mild to moderate mitral annular calcification present.  4. The tricuspid valve is grossly normal.  5. The aortic valve was not well visualized.  6. The aortic root is normal in size and structure.  Echo Study Conclusions 01/2018 - Left ventricle: The cavity size was mildly dilated. Wall   thickness was increased in a pattern of mild LVH. Systolic   function was moderately reduced. The estimated ejection fraction   was in the range of 35% to 40%. Diffuse hypokinesis. There was no   evidence of elevated ventricular filling pressure by Doppler   parameters. - Aorta: Aortic root dimension: 44 mm (ED). - Ascending aorta: The ascending aorta was mildly dilated. - Left atrium: The atrium was mildly dilated. - Right ventricle: The cavity size was moderately dilated. Wall   thickness was normal. - Right atrium: The atrium was mildly dilated.   Impressions: - EF has mildly improved when compared to prior (25%).     Echo  02/23/17 Study Conclusions   - Left ventricle: The cavity size was severely dilated. Wall   thickness was increased in a pattern of moderate LVH. Systolic   function was severely reduced. The estimated ejection fraction   was in the range of 25% to 30%. Diffuse hypokinesis. Features are   consistent with a pseudonormal left ventricular filling pattern,   with concomitant abnormal relaxation and increased filling   pressure (grade 2 diastolic dysfunction). Doppler parameters are   consistent with high ventricular filling pressure. - Aortic valve: There was  trivial regurgitation. - Aortic root: The aortic root was mildly dilated. - Mitral valve: Calcified annulus. There was mild regurgitation. - Left atrium: The atrium was moderately dilated. - Right atrium: The atrium was moderately dilated.   Impressions:   - Severe global reduction in LV systolic function; severe LVE;   moderate LVH; moderate diastolic dysfunction with elevated LV   filling pressure; mildly dilated aortic root;  trace AI; mild MR;   biatrial enlargement.   Carotids  07/02/18 :  Plaque no stenosis f/u 2 years   EKG:  05/12/20 SR rate 69 LVH  Recent Labs: 06/28/2021: ALT 14; Magnesium 2.3 07/20/2021: B Natriuretic Peptide >4,500.0; BUN 39; Creatinine, Ser 9.13; Hemoglobin 11.0; Platelets 154; Potassium 4.3; Sodium 136   Recent Lipid Panel    Component Value Date/Time   CHOL 148 08/01/2019 0844   TRIG 137 08/01/2019 0844   HDL 33 (L) 08/01/2019 0844   CHOLHDL 4.5 08/01/2019 0844   CHOLHDL 4.7 11/04/2014 1202   VLDL 38 11/04/2014 1202   LDLCALC 90 08/01/2019 0844   LDLDIRECT 80 07/18/2012 1010    Physical Exam:    VS:  There were no vitals taken for this visit.    Wt Readings from Last 3 Encounters:  08/19/21 193 lb 9.6 oz (87.8 kg)  06/21/21 194 lb 6.4 oz (88.2 kg)  05/26/21 200 lb 1.9 oz (90.8 kg)    Affect appropriate Healthy:  appears stated age HEENT: normal Neck supple with no adenopathy JVP normal no bruits no thyromegaly Lungs clear with no wheezing and good diaphragmatic motion Heart:  S1/S2 no murmur, no rub, gallop or click PMI normal Abdomen: benighn, BS positve, no tenderness, no AAA no bruit.  No HSM or HJR post nephrectomy large ventral hernia  Distal pulses intact with no bruits No edema Neuro non-focal Skin warm and dry LUE fistula with good thrill     PLAN:    In order of problems listed above:  CHF: -EF was down to 25-30% in 02/2017.  Patient placed on optimal medical therapy.  EF improved 30-35% by TTE 05/18/21 with  mild/mod MR and AV sclerosis  Myovue no ischemia likely diaphragmatic attenuation EF 39% 08/13/20  -Patient not on ACE/ARB/Entresto due to renal failure.  He continues on beta-blocker, hydralazine. Also still on lasix 80 mg BID, still makes some urine. Fluid balance per dialysis.  ESRD -Patient on hemodialysis Tuesday/Thursday/Saturday. -He was previously turned down for renal transplant at [facility].  Per prior notes patient is hopeful to get a kidney from a family member.  His cardiomyopathy may prohibit this from occurring.  Hypertension -On amlodipine 5 mg, carvedilol 25 mg, hydralazine 100 mg 3 times daily  Hyperlipidemia -On pravastatin 40 mg daily. -Last lipid panel in December 2020 LDL 90   Renal Cell CA -Status post left radical nephrectomy.   Prior CVA -On chronic Plavix.  Carotid arteries noted with mild plaque, no significant stenosis. 06/15/20   Ventral Hernia:  Large less reducible and painful last 3 weeks Clear to have surgery Monday With Dr Rosendo Gros will need dialysis day after surgery He lives alone and willl likely need 4-5 day Hospital stay   Dyspnea:  chronic and worsening with multiple ER visits elevated BNP Hct ok Respiratory panel negative CTA with no PE but cephalization small effusion ? Ascites Severe OSA unable to tolerate CPAP.  Feels better on oxygen during dialysis but not prescribed by primary/pulmonary indicate sats ok Concern for refractory CHF and pulmonary HTN Volume trying to be controlled with dialysis He is on coreg,hydralazine and lasix no other GDMT for CHF given CRF. He refuses right and left heart cath at this time   F/U in 3 months    Signed, [name], MD  09/23/2021 1:34 PM    [facility]

## 2021-09-23 ENCOUNTER — Encounter: Payer: Self-pay | Admitting: Cardiovascular Disease

## 2021-09-23 ENCOUNTER — Other Ambulatory Visit: Payer: Self-pay

## 2021-09-23 ENCOUNTER — Ambulatory Visit (INDEPENDENT_AMBULATORY_CARE_PROVIDER_SITE_OTHER): Payer: Medicare Other | Admitting: Cardiovascular Disease

## 2021-09-23 VITALS — BP 138/80 | HR 86 | Ht 72.0 in | Wt 188.2 lb

## 2021-09-23 DIAGNOSIS — I420 Dilated cardiomyopathy: Secondary | ICD-10-CM | POA: Diagnosis not present

## 2021-09-23 DIAGNOSIS — I779 Disorder of arteries and arterioles, unspecified: Secondary | ICD-10-CM

## 2021-09-23 DIAGNOSIS — I10 Essential (primary) hypertension: Secondary | ICD-10-CM

## 2021-09-23 DIAGNOSIS — I5022 Chronic systolic (congestive) heart failure: Secondary | ICD-10-CM | POA: Diagnosis not present

## 2021-09-23 MED ORDER — FUROSEMIDE 80 MG PO TABS: 80.0000 mg | ORAL_TABLET | ORAL | 1 refills | Status: DC

## 2021-09-23 NOTE — Patient Instructions (Signed)
Follow-Up: Follow up with Dr. [name] in 3 months  Any Other Special Instructions Will Be Listed Below (If Applicable).  Call our office with your updated medication list. [phone]   If you need a refill on your cardiac medications before your next appointment, please call your pharmacy.

## 2021-10-19 ENCOUNTER — Encounter: Payer: Self-pay | Admitting: *Deleted

## 2021-10-19 ENCOUNTER — Telehealth: Payer: Self-pay

## 2021-10-19 NOTE — Telephone Encounter (Signed)
RTC to patient states that he needs a letter stating that he cannot wear Rubber boots at work.  Patient said that the boots cause blisters on his feet and prefers to wear the [employer] he is a diabetic.   Ok to do so if his Physician writes him a letter stating that he can wear the [employer] instead.  Letter should be sent to To Whom It May Concern. ?

## 2021-10-19 NOTE — Telephone Encounter (Signed)
Requesting a letter stating he cannot wear his work boot because he is diabetic. Please call pt back.  ?

## 2021-10-19 NOTE — Telephone Encounter (Signed)
Note written and sent to patient via mychart ?

## 2021-10-31 ENCOUNTER — Emergency Department (HOSPITAL_COMMUNITY): Payer: Medicare Other

## 2021-10-31 ENCOUNTER — Emergency Department (HOSPITAL_COMMUNITY)
Admission: EM | Admit: 2021-10-31 | Discharge: 2021-10-31 | Disposition: A | Discharge status: Home / Self Care | Payer: Medicare Other | Attending: Emergency Medicine | Admitting: Emergency Medicine

## 2021-10-31 ENCOUNTER — Other Ambulatory Visit: Payer: Self-pay

## 2021-10-31 ENCOUNTER — Encounter (HOSPITAL_COMMUNITY): Payer: Self-pay | Admitting: Emergency Medicine

## 2021-10-31 DIAGNOSIS — R5383 Other fatigue: Secondary | ICD-10-CM | POA: Insufficient documentation

## 2021-10-31 DIAGNOSIS — I509 Heart failure, unspecified: Secondary | ICD-10-CM | POA: Diagnosis not present

## 2021-10-31 DIAGNOSIS — R0602 Shortness of breath: Secondary | ICD-10-CM | POA: Insufficient documentation

## 2021-10-31 DIAGNOSIS — N186 End stage renal disease: Secondary | ICD-10-CM | POA: Diagnosis not present

## 2021-10-31 DIAGNOSIS — R7989 Other specified abnormal findings of blood chemistry: Secondary | ICD-10-CM | POA: Diagnosis not present

## 2021-10-31 DIAGNOSIS — Z992 Dependence on renal dialysis: Secondary | ICD-10-CM | POA: Diagnosis not present

## 2021-10-31 LAB — COMPREHENSIVE METABOLIC PANEL
ALT: 8 U/L (ref 0–44)
AST: 12 U/L — ABNORMAL LOW (ref 15–41)
Albumin: 3.4 g/dL — ABNORMAL LOW (ref 3.5–5.0)
Alkaline Phosphatase: 55 U/L (ref 38–126)
Anion gap: 10 (ref 5–15)
BUN: 8 mg/dL (ref 8–23)
CO2: 29 mmol/L (ref 22–32)
Calcium: 9.4 mg/dL (ref 8.9–10.3)
Chloride: 98 mmol/L (ref 98–111)
Creatinine, Ser: 4.62 mg/dL — ABNORMAL HIGH (ref 0.61–1.24)
GFR, Estimated: 13 mL/min — ABNORMAL LOW (ref >60)
Glucose, Bld: 89 mg/dL (ref 70–99)
Potassium: 3.5 mmol/L (ref 3.5–5.1)
Sodium: 137 mmol/L (ref 135–145)
Total Bilirubin: 1.5 mg/dL — ABNORMAL HIGH (ref 0.3–1.2)
Total Protein: 6.4 g/dL — ABNORMAL LOW (ref 6.5–8.1)

## 2021-10-31 LAB — CBC WITH DIFFERENTIAL/PLATELET
Abs Immature Granulocytes: 0.01 10*3/uL (ref 0.00–0.07)
Basophils Absolute: 0 10*3/uL (ref 0.0–0.1)
Basophils Relative: 1 %
Eosinophils Absolute: 0.2 10*3/uL (ref 0.0–0.5)
Eosinophils Relative: 5 %
HCT: 33.8 % — ABNORMAL LOW (ref 39.0–52.0)
Hemoglobin: 10.7 g/dL — ABNORMAL LOW (ref 13.0–17.0)
Immature Granulocytes: 0 %
Lymphocytes Relative: 12 %
Lymphs Abs: 0.5 10*3/uL — ABNORMAL LOW (ref 0.7–4.0)
MCH: 30.1 pg (ref 26.0–34.0)
MCHC: 31.7 g/dL (ref 30.0–36.0)
MCV: 94.9 fL (ref 80.0–100.0)
Monocytes Absolute: 0.4 10*3/uL (ref 0.1–1.0)
Monocytes Relative: 10 %
Neutro Abs: 3 10*3/uL (ref 1.7–7.7)
Neutrophils Relative %: 72 %
Platelets: 141 10*3/uL — ABNORMAL LOW (ref 150–400)
RBC: 3.56 MIL/uL — ABNORMAL LOW (ref 4.22–5.81)
RDW: 15.4 % (ref 11.5–15.5)
WBC: 4.1 10*3/uL (ref 4.0–10.5)
nRBC: 0 % (ref 0.0–0.2)

## 2021-10-31 LAB — BRAIN NATRIURETIC PEPTIDE: B Natriuretic Peptide: >4500 pg/mL — ABNORMAL HIGH (ref 0.0–100.0)

## 2021-10-31 LAB — TROPONIN I (HIGH SENSITIVITY)
Troponin I (High Sensitivity): 32 ng/L — ABNORMAL HIGH (ref <18)
Troponin I (High Sensitivity): 34 ng/L — ABNORMAL HIGH (ref <18)

## 2021-10-31 NOTE — ED Provider Triage Note (Signed)
Emergency Medicine Provider Triage Evaluation Note ? ?Charles Hart , a 66 y.o. male  was evaluated in triage.  Pt complains of shortness of breath. He states that same has been ongoing for the past 6 months.  States that he has been here several times with the same complaint without acute findings.  States that dialysis is the only thing that improves his symptoms.  He is a Tuesday, Thursday, Saturday hemodialysis patient.  Completed dialysis yesterday and states that for a few hours following his treatment he felt better, however states that after that he began to feel short of breath again.  States that he has been told to follow-up with his primary care doctor multiple times for continued evaluation management of his symptoms, however states he was unable to get an appointment until this Thursday.  Did not feel like he could wait till then.  Denies any changes in his symptoms over the past 6 months. ? ?Review of Systems  ?Positive:  ?Negative: See above ? ?Physical Exam  ?BP (!) 143/77 (BP Location: Right Arm)   Pulse 86   Temp 98.6 ?F (37 ?C)   Resp 17   SpO2 98%  ?Gen:   Awake, no distress   ?Resp:  Normal effort  ?MSK:   Moves extremities without difficulty  ?Other:   ? ?Medical Decision Making  ?Medically screening exam initiated at 2:36 PM.  Appropriate orders placed.  Charles Hart was informed that the remainder of the evaluation will be completed by another provider, this initial triage assessment does not replace that evaluation, and the importance of remaining in the ED until their evaluation is complete. ? ? ?  ?Bud Face, PA-C ?10/31/21 1439 ? ?

## 2021-10-31 NOTE — Discharge Instructions (Signed)
We have placed a consult with our social worker to see if we can assist with home health or need to come to the house to help you.  In the meantime, if you are looking for long-term care placement, you should call the number above for [facility].  Your primary care doctor's office can also assist you.  The emergency department does not perform patient placement for chronic medical conditions. ? ?You should be contacted by home health agencies within 1-2 business days, to talk to about your eligibility for home health resources. ?

## 2021-10-31 NOTE — Care Management (Signed)
ED RNCM reviewed patient's chart and spoke with the EDP.  ED CM provided information regarding CAPS in home program and long-term placement, CM will also send a message to PCP SW to contact patient to provide assistance with the process. No further ED RNCM needs identified.  ?

## 2021-10-31 NOTE — ED Provider Notes (Signed)
?[place] ?Provider Note ? ? ?CSN: [id number] ?Arrival date & time: 10/31/21  1336 ? ?  ? ?History ? ?Chief Complaint  ?Patient presents with  ? Shortness of Breath  ? ? ?Charles Hart is a 66 y.o. male w/ CHF, ESRD on dialysis here with chronic dyspnea, fatigue.  Patient reports this has been ongoing for months but feels he is reaching the point he cannot care for himself.  He lives alone in [place].  States family does not visit, does not have home health.  He drives to dialysis and has not missed any sessions recently.  Dyspnea is chronic, not on home oxygen (but feels he breathes better with supplemental O2). ? ?HPI ? ?  ? ?Home Medications ?Prior to Admission medications   ?Medication Sig Start Date End Date Taking? Authorizing Provider  ?acetaminophen (TYLENOL) 325 MG tablet Take 2 tablets (650 mg total) by mouth every 6 (six) hours as needed for mild pain. 11/18/20   Norm Parcel, PA-C  ?calcium acetate (PHOSLO) 667 MG capsule Take 2,001 mg by mouth 3 (three) times daily with meals.  12/15/17   Tye Savoy, MD  ?carvedilol (COREG) 25 MG tablet Take 1 tablet (25 mg total) by mouth 2 (two) times daily with a meal. 09/27/18   Lucious Groves, DO  ?Cholecalciferol (VITAMIN D3) 1.25 MG (50000 UT) CAPS Take 1 capsule by mouth once a week. 09/09/21   [provider]  ?fluticasone (FLONASE) 50 MCG/ACT nasal spray Place 2 sprays into both nostrils as needed for allergies. 09/27/18   Lucious Groves, DO  ?folic acid-vitamin b complex-vitamin c-selenium-zinc (DIALYVITE) 3 MG TABS tablet Take 1 tablet by mouth daily.    [provider]  ?furosemide (LASIX) 80 MG tablet Take 1 tablet (80 mg total) by mouth every other day. 09/23/21   Josue Hector, MD  ?gabapentin (NEURONTIN) 300 MG capsule Take 1 capsule (300 mg total) by mouth at bedtime. 08/20/21   Lucious Groves, DO  ?hydrALAZINE (APRESOLINE) 100 MG tablet Take 1 tablet (100 mg total) by mouth 3 (three)  times daily. 07/20/21 08/19/21  Evalee Jefferson, PA-C  ?meclizine (ANTIVERT) 12.5 MG tablet Take 1 tablet (12.5 mg total) by mouth 3 (three) times daily as needed for dizziness. 12/12/20   Long, Wonda Olds, MD  ?pravastatin (PRAVACHOL) 40 MG tablet Take 1 tablet (40 mg total) by mouth daily. 01/28/20   Lucious Groves, DO  ?SUPER B COMPLEX/C PO Take 1 tablet by mouth daily.    [provider]  ?   ? ?Allergies    ?Ivp dye [iodinated contrast media], Hydrocodone, Phentermine, Amlodipine, Lisinopril, and Red dye   ? ?Review of Systems   ?Review of Systems ? ?Physical Exam ?Updated Vital Signs ?BP (!) 149/93   Pulse 93   Temp 98.6 ?F (37 ?C)   Resp 13   SpO2 98%  ?Physical Exam ?Constitutional:   ?   General: He is not in acute distress. ?HENT:  ?   Head: Normocephalic and atraumatic.  ?Eyes:  ?   Conjunctiva/sclera: Conjunctivae normal.  ?   Pupils: Pupils are equal, round, and reactive to light.  ?Cardiovascular:  ?   Rate and Rhythm: Normal rate and regular rhythm.  ?Pulmonary:  ?   Effort: Pulmonary effort is normal. No respiratory distress.  ?Abdominal:  ?   General: There is no distension.  ?   Tenderness: There is no abdominal tenderness.  ?Skin: ?  General: Skin is warm and dry.  ?Neurological:  ?   General: No focal deficit present.  ?   Mental Status: He is alert. Mental status is at baseline.  ?Psychiatric:     ?   Mood and Affect: Mood normal.     ?   Behavior: Behavior normal.  ? ? ?ED Results / Procedures / Treatments   ?Labs ?(all labs ordered are listed, but only abnormal results are displayed) ?Labs Reviewed  ?CBC WITH DIFFERENTIAL/PLATELET - Abnormal; Notable for the following components:  ?    Result Value  ? RBC 3.56 (*)   ? Hemoglobin 10.7 (*)   ? HCT 33.8 (*)   ? Platelets 141 (*)   ? Lymphs Abs 0.5 (*)   ? All other components within normal limits  ?BRAIN NATRIURETIC PEPTIDE - Abnormal; Notable for the following components:  ? B Natriuretic Peptide >4,500.0 (*)   ? All other components within  normal limits  ?COMPREHENSIVE METABOLIC PANEL - Abnormal; Notable for the following components:  ? Creatinine, Ser 4.62 (*)   ? Total Protein 6.4 (*)   ? Albumin 3.4 (*)   ? AST 12 (*)   ? Total Bilirubin 1.5 (*)   ? GFR, Estimated 13 (*)   ? All other components within normal limits  ?TROPONIN I (HIGH SENSITIVITY) - Abnormal; Notable for the following components:  ? Troponin I (High Sensitivity) 32 (*)   ? All other components within normal limits  ?TROPONIN I (HIGH SENSITIVITY) - Abnormal; Notable for the following components:  ? Troponin I (High Sensitivity) 34 (*)   ? All other components within normal limits  ? ? ?EKG ?EKG Interpretation ? ?Date/Time:  Sunday October 31 2021 14:25:13 EDT ?Ventricular Rate:  87 ?PR Interval:  170 ?QRS Duration: 118 ?QT Interval:  424 ?QTC Calculation: 510 ?R Axis:   53 ?Text Interpretation: Normal sinus rhythm Left ventricular hypertrophy with QRS widening and repolarization abnormality ( Sokolow-Lyon ) Prolonged QT Abnormal ECG When compared with ECG of 20-Jul-2021 11:53, PREVIOUS ECG IS PRESENT Confirmed by Octaviano Glow [id number]) on 10/31/2021 6:03:05 PM ? ?Radiology ?DG Chest 2 View ? ?Result Date: 10/31/2021 ?CLINICAL DATA:  Shortness of breath EXAM: CHEST - 2 VIEW COMPARISON:  Chest x-ray 07/20/2021 FINDINGS: Heart is enlarged. Mediastinum appears stable. Pulmonary vasculature within normal limits. No focal consolidation, pleural effusion or pneumothorax identified. IMPRESSION: Cardiomegaly with no acute process identified. Electronically Signed   By: Ofilia Neas M.D.   On: 10/31/2021 15:25   ? ?Procedures ?Procedures  ? ? ?Medications Ordered in ED ?Medications - No data to display ? ?ED Course/ Medical Decision Making/ A&P ?Clinical Course as of 10/31/21 2202  ?Sun Oct 31, 2021  ?[place] contacted will provide resources for home health options, patient will need to contact [MT]  ?  ?Clinical Course User Index ?[MT] Wyvonnia Dusky, MD  ? ?                         ?Medical Decision Making ? ?Chronic dyspnea - likely multifactorial from advanced CHF, dialysis, chronic comorbdities ?Patient breathing comfortably on exam on room air, no hypoxia ?Labs reviewed by myself, K wnl, hgb at baseline, no acute significant anemia, likely anemia of chronic disease, BNP elevated in setting of ESRD ? ?Chest xray personally reviewed, cardiomegaly without focal infiltrate or significant pulm edema ?ECG per my interpretation shows no acute ischemic changes.  Troponin mildly elevated likely 2/2 ESRD.  Low  clinical suspicion for ACS or PE at this time. ? ?SW consulted for home health ?Patient asking to be hospitalized essentially for home care.  I explained there is no emergent indication for hospitalization or dialysis, we can try to assist with home health, and will provide a phone number for him to search for long term care, but he should contact PCP to assist with this as well. ? ? ? ? ? ? ? ?Final Clinical Impression(s) / ED Diagnoses ?Final diagnoses:  ?Shortness of breath  ? ? ?Rx / DC Orders ?ED Discharge Orders   ? ? None  ? ?  ? ? ?  ?Wyvonnia Dusky, MD ?10/31/21 2202 ? ?

## 2021-10-31 NOTE — ED Triage Notes (Signed)
Patient complains of shortness of breath for the last six months, denies history of asthma, reports having multiple negative COVID-19 tests during this time frame. Patient reports the only intervention that improves his shortness of breath is he feels better after dialysis. Patient SpO2 on room air 98%, speaking in complete sentences, is alert, oriented, and in no apparent distress at this time. ?

## 2021-11-29 ENCOUNTER — Emergency Department (HOSPITAL_COMMUNITY)
Admission: EM | Admit: 2021-11-29 | Discharge: 2021-11-30 | Disposition: A | Discharge status: Home / Self Care | Payer: Medicare Other | Attending: Emergency Medicine | Admitting: Emergency Medicine

## 2021-11-29 ENCOUNTER — Encounter (HOSPITAL_COMMUNITY): Payer: Self-pay

## 2021-11-29 ENCOUNTER — Other Ambulatory Visit: Payer: Self-pay

## 2021-11-29 DIAGNOSIS — R110 Nausea: Secondary | ICD-10-CM | POA: Insufficient documentation

## 2021-11-29 DIAGNOSIS — I5042 Chronic combined systolic (congestive) and diastolic (congestive) heart failure: Secondary | ICD-10-CM | POA: Diagnosis not present

## 2021-11-29 DIAGNOSIS — Z992 Dependence on renal dialysis: Secondary | ICD-10-CM | POA: Insufficient documentation

## 2021-11-29 DIAGNOSIS — R197 Diarrhea, unspecified: Secondary | ICD-10-CM | POA: Diagnosis present

## 2021-11-29 DIAGNOSIS — N186 End stage renal disease: Secondary | ICD-10-CM | POA: Diagnosis not present

## 2021-11-29 DIAGNOSIS — I132 Hypertensive heart and chronic kidney disease with heart failure and with stage 5 chronic kidney disease, or end stage renal disease: Secondary | ICD-10-CM | POA: Insufficient documentation

## 2021-11-29 DIAGNOSIS — Z85528 Personal history of other malignant neoplasm of kidney: Secondary | ICD-10-CM | POA: Diagnosis not present

## 2021-11-29 DIAGNOSIS — E1140 Type 2 diabetes mellitus with diabetic neuropathy, unspecified: Secondary | ICD-10-CM | POA: Insufficient documentation

## 2021-11-29 DIAGNOSIS — Z79899 Other long term (current) drug therapy: Secondary | ICD-10-CM | POA: Diagnosis not present

## 2021-11-29 DIAGNOSIS — R0982 Postnasal drip: Secondary | ICD-10-CM | POA: Diagnosis not present

## 2021-11-29 LAB — COMPREHENSIVE METABOLIC PANEL
ALT: 18 U/L (ref 0–44)
AST: 19 U/L (ref 15–41)
Albumin: 4 g/dL (ref 3.5–5.0)
Alkaline Phosphatase: 78 U/L (ref 38–126)
Anion gap: 12 (ref 5–15)
BUN: 26 mg/dL — ABNORMAL HIGH (ref 8–23)
CO2: 26 mmol/L (ref 22–32)
Calcium: 9.1 mg/dL (ref 8.9–10.3)
Chloride: 100 mmol/L (ref 98–111)
Creatinine, Ser: 6.33 mg/dL — ABNORMAL HIGH (ref 0.61–1.24)
GFR, Estimated: 9 mL/min — ABNORMAL LOW (ref >60)
Glucose, Bld: 80 mg/dL (ref 70–99)
Potassium: 3.8 mmol/L (ref 3.5–5.1)
Sodium: 138 mmol/L (ref 135–145)
Total Bilirubin: 1.2 mg/dL (ref 0.3–1.2)
Total Protein: 8 g/dL (ref 6.5–8.1)

## 2021-11-29 LAB — CBC
HCT: 42.3 % (ref 39.0–52.0)
Hemoglobin: 13.1 g/dL (ref 13.0–17.0)
MCH: 30 pg (ref 26.0–34.0)
MCHC: 31 g/dL (ref 30.0–36.0)
MCV: 97 fL (ref 80.0–100.0)
Platelets: 143 10*3/uL — ABNORMAL LOW (ref 150–400)
RBC: 4.36 MIL/uL (ref 4.22–5.81)
RDW: 15.9 % — ABNORMAL HIGH (ref 11.5–15.5)
WBC: 4.7 10*3/uL (ref 4.0–10.5)
nRBC: 0 % (ref 0.0–0.2)

## 2021-11-29 LAB — LIPASE, BLOOD: Lipase: 33 U/L (ref 11–51)

## 2021-11-29 NOTE — ED Triage Notes (Signed)
Patient states he had hernia surgery back in march and has been having diarrhea ever since. Intermittent abdominal pain and states when his sinuses drains into his stomach he get nauseous.  ?

## 2021-11-30 NOTE — ED Provider Notes (Signed)
?[facility] ?Provider Note ? ? ?CSN: [id number] ?Arrival date & time: 11/29/21  1604 ? ?  ? ?History ? ?Chief Complaint  ?Patient presents with  ? Abdominal Pain  ? ? ?Charles Hart is a 66 y.o. male. ? ?HPI ? ?  ?This is a 66 year old male who presents with diarrhea.  Patient reports that he had hernia repair in March.  Since that time he has had ongoing nonbloody diarrhea.  He does report some nausea.  He associates this with postnasal drip related to his sinuses.  He has been taking over-the-counter diarrhea medication which initially seemed to help.  He has not had any fevers.  He reports multiple episodes of loose stools daily.  He states he feels it has gotten worse.  He is not having any significant abdominal pain.  No recent antibiotics. ?Home Medications ?Prior to Admission medications   ?Medication Sig Start Date End Date Taking? Authorizing Provider  ?acetaminophen (TYLENOL) 325 MG tablet Take 2 tablets (650 mg total) by mouth every 6 (six) hours as needed for mild pain. 11/18/20   Norm Parcel, PA-C  ?calcium acetate (PHOSLO) 667 MG capsule Take 2,001 mg by mouth 3 (three) times daily with meals.  12/15/17   Tye Savoy, MD  ?carvedilol (COREG) 25 MG tablet Take 1 tablet (25 mg total) by mouth 2 (two) times daily with a meal. 09/27/18   Lucious Groves, DO  ?Cholecalciferol (VITAMIN D3) 1.25 MG (50000 UT) CAPS Take 1 capsule by mouth once a week. 09/09/21   [provider]  ?fluticasone (FLONASE) 50 MCG/ACT nasal spray Place 2 sprays into both nostrils as needed for allergies. 09/27/18   Lucious Groves, DO  ?folic acid-vitamin b complex-vitamin c-selenium-zinc (DIALYVITE) 3 MG TABS tablet Take 1 tablet by mouth daily.    [provider]  ?furosemide (LASIX) 80 MG tablet Take 1 tablet (80 mg total) by mouth every other day. 09/23/21   Josue Hector, MD  ?gabapentin (NEURONTIN) 300 MG capsule Take 1 capsule (300 mg total) by mouth at bedtime. 08/20/21   Lucious Groves, DO  ?hydrALAZINE (APRESOLINE) 100 MG tablet Take 1 tablet (100 mg total) by mouth 3 (three) times daily. 07/20/21 08/19/21  Evalee Jefferson, PA-C  ?meclizine (ANTIVERT) 12.5 MG tablet Take 1 tablet (12.5 mg total) by mouth 3 (three) times daily as needed for dizziness. 12/12/20   Long, Wonda Olds, MD  ?pravastatin (PRAVACHOL) 40 MG tablet Take 1 tablet (40 mg total) by mouth daily. 01/28/20   Lucious Groves, DO  ?SUPER B COMPLEX/C PO Take 1 tablet by mouth daily.    [provider]  ?   ? ?Allergies    ?Ivp dye [iodinated contrast media], Hydrocodone, Phentermine, Amlodipine, Lisinopril, and Red dye   ? ?Review of Systems   ?Review of Systems  ?Constitutional:  Negative for fever.  ?Respiratory:  Negative for shortness of breath.   ?Cardiovascular:  Negative for chest pain.  ?Gastrointestinal:  Positive for diarrhea and nausea.  ?All other systems reviewed and are negative. ? ?Physical Exam ?Updated Vital Signs ?BP 132/79   Pulse 92   Temp 98.7 ?F (37.1 ?C) (Oral)   Resp 18   Ht 1.829 m (6')   Wt 86 kg   SpO2 95%   BMI 25.71 kg/m?  ?Physical Exam ?Vitals and nursing note reviewed.  ?Constitutional:   ?   Appearance: He is well-developed. He is obese.  ?   Comments: Chronically ill-appearing but  nontoxic, no acute distress  ?HENT:  ?   Head: Normocephalic and atraumatic.  ?Eyes:  ?   Pupils: Pupils are equal, round, and reactive to light.  ?Cardiovascular:  ?   Rate and Rhythm: Normal rate and regular rhythm.  ?   Heart sounds: Normal heart sounds. No murmur heard. ?Pulmonary:  ?   Effort: Pulmonary effort is normal. No respiratory distress.  ?   Breath sounds: Normal breath sounds. No wheezing.  ?Abdominal:  ?   General: Bowel sounds are normal.  ?   Palpations: Abdomen is soft.  ?   Tenderness: There is no abdominal tenderness. There is no rebound.  ?Musculoskeletal:  ?   Cervical back: Neck supple.  ?Lymphadenopathy:  ?   Cervical: No cervical adenopathy.  ?Skin: ?   General: Skin is warm and dry.   ?Neurological:  ?   Mental Status: He is alert and oriented to person, place, and time.  ?Psychiatric:     ?   Mood and Affect: Mood normal.  ? ? ?ED Results / Procedures / Treatments   ?Labs ?(all labs ordered are listed, but only abnormal results are displayed) ?Labs Reviewed  ?COMPREHENSIVE METABOLIC PANEL - Abnormal; Notable for the following components:  ?    Result Value  ? BUN 26 (*)   ? Creatinine, Ser 6.33 (*)   ? GFR, Estimated 9 (*)   ? All other components within normal limits  ?CBC - Abnormal; Notable for the following components:  ? RDW 15.9 (*)   ? Platelets 143 (*)   ? All other components within normal limits  ?C DIFFICILE QUICK SCREEN W PCR REFLEX    ?LIPASE, BLOOD  ?URINALYSIS, ROUTINE W REFLEX MICROSCOPIC  ? ? ?EKG ?None ? ?Radiology ?No results found. ? ?Procedures ?Procedures  ? ? ?Medications Ordered in ED ?Medications - No data to display ? ?ED Course/ Medical Decision Making/ A&P ?Clinical Course as of 11/30/21 0536  ?Tue Nov 30, 2021  ?0535 Patient had a stool here that was formed.  Not amenable for C. difficile testing.  He has no significant leukocytosis.[name]  He has been referred to gastroenterology.  Recommend he keep this appointment. [CH]  ?  ?Clinical Course User Index ?[CH] Carlie Solorzano, Barbette Hair, MD  ? ?                        ?Medical Decision Making ?Amount and/or Complexity of Data Reviewed ?Labs: ordered. ? ? ?This patient presents to the ED for concern of diarrhea, this involves an extensive number of treatment options, and is a complaint that carries with it a high risk of complications and morbidity.  The differential diagnosis includes infectious diarrhea, C. difficile colitis, viral etiology ? ?MDM:   ? ?Patient presents with diarrhea.  He is nontoxic and vital signs are reassuring.  Denies any abdominal pain.  Also reports some nausea.  Recent hernia surgery.  He is nontoxic and vital signs reassuring.  Labs obtained.  Creatinine 6.33..  He is due for dialysis today.  No  hyperkalemia.  No significant leukocytosis.  He denies any recent travel.  He would be at high risk for C. difficile given recent surgery.  However, he provided a stool sample that was well formed.  Lab rejected as they would not test for C. difficile.  It did not appear consistent with C. difficile.  I reviewed his chart.  He has had referral to [facility].  Recommend that he  make and keep this appointment.  He will be discharged to dialysis this morning.  Do not feel he needs advanced imaging at this time as his abdominal exam is benign and symptoms are subacute. ?(Labs, imaging) ? ?Labs: ?I Ordered, and personally interpreted labs.  The pertinent results include: CBC, CMP, lipase ? ?Imaging Studies ordered: ?I ordered imaging studies including none ?I independently visualized and interpreted imaging. ?I agree with the radiologist interpretation ? ?Additional history obtained from chart review.  External records from outside source obtained and reviewed including prior evaluations ? ?Critical Interventions: ?None ? ?Consultations: ?I requested consultation with the NA,  and discussed lab and imaging findings as well as pertinent plan - they recommend: N/A ? ?Cardiac Monitoring: ?The patient was maintained on a cardiac monitor.  I personally viewed and interpreted the cardiac monitored which showed an underlying rhythm of: Normal sinus rhythm ? ?Reevaluation: ?After the interventions noted above, I reevaluated the patient and found that they have :stayed the same ? ? ?Considered admission for: N/A ? ?Social Determinants of Health: ?Lives independently, end-stage renal disease ? ?Disposition: Discharge ? ?Co morbidities that complicate the patient evaluation ? ?Past Medical History:  ?Diagnosis Date  ? Adenomatous colon polyp 07/02/2011  ? Last colonoscopy May 06, 2011 by Dr. Owens Loffler, who recommended repeat colonoscopy in 5 years.   ? Anemia   ? Background diabetic retinopathy 04/20/2012  ? Patient is  followed by Dr. Katy Fitch   ? Cancer [facility])   ? Kidney  ? Cardiomyopathy   ? LV function improved from 2004 to 2008.  Historically, moderately dilated LV with EF 30-40% by 2D echo 08/14/2002.  Mild CAD with severe LV dysfuncti

## 2021-11-30 NOTE — ED Notes (Signed)
Went over d/c papers. Verbalized understanding. Ambulatory to lobby with cane. Denies pain.  ?

## 2021-11-30 NOTE — ED Notes (Signed)
Pt requesting crackers and water. Provided.  ?Denies pain right now. Denies emesis. ?

## 2021-11-30 NOTE — Discharge Instructions (Addendum)
Continue your Imodium at home.  Call gastroenterology for follow-up appointment.  If you have new or worsening symptoms, you should be reevaluated. ?

## 2021-12-20 ENCOUNTER — Encounter: Payer: Self-pay | Admitting: Nephrology

## 2022-01-12 NOTE — Progress Notes (Deleted)
Cardiology Office Note:    Date:  01/12/2022   ID:  TARRON KROLAK, DOB 1956-03-19, MRN [id number]  PCP:  Lucious Groves, DO  Cardiologist:  [name], MD  Referring MD: Lucious Groves, DO   No chief complaint on file.   History of Present Illness:    TYREK LAWHORN is a 66 y.o. male with a past medical history significant for poorly controlled hypertension complicated by renal failure and dialysis as well as CVA in 2013.  He is noted to have nonischemic dilated cardiomyopathy with normal cath in 2012 in 2013.  He went through divorce in 2019.  He had renal cell carcinoma found on CT scan in 04/2018 and had left radical nephrectomy with no cardiac complications on 51/70/0174.  He gets hemodialysis on Tuesday/Thursday/Saturday.  He was turned down at [facility] for renal transplant.  He had indicated that 1 of his children may try to provide a kidney. However I suspect with his age and DCM he would not be a canddiate   Echocardiogram on 05/18/21 EF 30-35% mild/moderate MR  Not totally anuric and takes lasix Not on ARB/ACE/Entresto due to renal failure  Has severe OSA on CPAP Dr Azzie Roup concern for ongoing dyspnea and orthopnea with dialysis trying to lower EDW Mentioned needing home oxygen but sats ok and Dr Heber Nubieber suggested f/u with pulmonary Dr Melvyn Novas Only tolerates CPAP for about 30 minutes     Seen in ED 07/20/21 dyspnea that has been going on for months CXR mild cephalization CTA negative for PE  small effusion ? Ascites and dilated MPA BNP > 4500 Troponin 65 Hct 35.4 K 4.3 Cr 9.13 Respiratory panel negative   He seems devastated about being taken off transplant list at [facility] He says his sats are ok at dialysis but he feels better on oxygen  Discussed right and left heart cath with him but he refuses at this time  Had ventral hernia surgery in March Has had abdominal pain and diarrhea since Seen in ED 11/30/21 and referred to GI Hct 33.8 WBC normal    Past Medical History:   Diagnosis Date   Adenomatous colon polyp 07/02/2011   Last colonoscopy May 06, 2011 by Dr. Owens Loffler, who recommended repeat colonoscopy in 5 years.    Anemia    Background diabetic retinopathy 04/20/2012   Patient is followed by Dr. Katy Fitch    Cancer [facility])    Kidney   Cardiomyopathy    LV function improved from 2004 to 2008.  Historically, moderately dilated LV with EF 30-40% by 2D echo 08/14/2002.  Mild CAD with severe LV dysfunction by cardiac cath 09/2002.  Normal coronary arteries and normal LV function by cardiac cath 09/19/2006.  A 2-D echo on 04/01/2009 showed mild concentric hypertrophy and normal systolic (LVEF  94-49%) and doppler C/W with grade 1 diastolic dysfunction.   CHF (congestive heart failure) (HCC)    LV function improved from 2004 to 2008.  Historically, moderately dilated LV with EF 30-40% by 2D echo 08/14/2002.  Mild CAD with severe LV dysfunction by cardiac cath 09/2002.  Normal coronary arteries and normal LV function by cardiac cath 09/19/2006.  A 2-D echo on 04/01/2009 showed mild concentric hypertrophy and normal systolic (LVEF  67-59%) and doppler C/W with grade 1 diastolic dysfunction..    Chronic combined systolic and diastolic congestive heart failure ([place]) 05/21/2010   LV function improved from 2004 to 2008.  Historically, moderately dilated LV with EF 30-40% by 2D echo 08/14/2002.  Mild CAD with severe LV dysfunction by cardiac cath 09/2002.  Normal coronary arteries and normal LV function by cardiac cath 09/19/2006.  A 2-D echo on 04/01/2009 showed mild concentric hypertrophy and normal systolic (LVEF  41-32%) and doppler parameters consistent with abnormal left    CVA (cerebral vascular accident) ([place]) 07/04/2012   MRI of the brain 07/04/2012 showed an acute infarct in the right basal ganglia involving the anterior putamen, anterior limb internal capsule, and head of the caudate; this measured approximately 2.5 cm in diameter.      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   Qualifier: Diagnosis of  By: Marinda Elk MD, Sonia Side     DM neuropathy, painful ([place])    fingers and right knee   ESRD (end stage renal disease) ([place])    Stage 4, on hemodailysis x 4 months as of 05-18-18 [facility], tues thurs sat   ESRD (end stage renal disease) on dialysis ([place])    "TTS; [facility]" (05/31/2018)   Hearing loss in right ear    Hyperlipidemia    Hypertension    Hypertensive crisis 07/28/2012   Hypertensive urgency 08/20/2014   Myocardial infarction [facility])  many yrs ago   Nephrotic syndrome 02/18/2013   A 24-hour urine collection 03/04/2013 showed total protein of 5,460 g and creatinine clearance of 80 mL/minute.  Patient was seen by Seward Meth at [place] and a repeat 24-hour urine showed 10,407 mg protein.  Patient underwent kidney biopsy on 05/30/2013; pathology showed advanced diffuse and nodular diabetic nephropathy with vascular changes consistent with long-standing difficult to control hypertension.      Pneumonia 2014 and 2015   Renal insufficiency    Sleep apnea    11/06/20 - hasn't used it "in a while"   Surgical pneumoperitoneum    Trochanteric bursitis of right hip 04/25/2018    Past Surgical History:  Procedure Laterality Date   AV FISTULA PLACEMENT Left 11/24/2017   Procedure: INSERTION OF ARTERIOVENOUS (AV) GORE-TEX GRAFT LEFT LOWER ARM;  Surgeon: Rosetta Posner, MD;  Location: [place];  Service: Vascular;  Laterality: Left;   CARDIAC CATHETERIZATION      4 times   COLONOSCOPY     COLONOSCOPY WITH PROPOFOL N/A 06/18/2020   Procedure: COLONOSCOPY WITH PROPOFOL;  Surgeon: Milus Banister, MD;  Location: [facility];  Service: Endoscopy;  Laterality: N/A;   FOOT SURGERY     INCISIONAL HERNIA REPAIR N/A 11/09/2020   Procedure: OPEN INCISIONAL HERNIA REPAIR WITH MESH;  Surgeon: Ralene Ok, MD;  Location: [facility];  Service: General;  Laterality: N/A;   INSERTION OF MESH  N/A 11/09/2020   Procedure: INSERTION OF MESH;  Surgeon: Ralene Ok, MD;  Location: [facility] OR;  Service: General;  Laterality: N/A;   POLYPECTOMY     POLYPECTOMY  06/18/2020   Procedure: POLYPECTOMY;  Surgeon: Milus Banister, MD;  Location: [facility];  Service: Endoscopy;;   ROBOT ASSISTED LAPAROSCOPIC NEPHRECTOMY Left 05/30/2018   Procedure: XI ROBOTIC ASSISTED LAPAROSCOPIC NEPHRECTOMY;  Surgeon: Alexis Frock, MD;  Location: [facility];  Service: Urology;  Laterality: Left;    Current Medications: No outpatient medications have been marked as taking for the 01/18/22 encounter (Appointment) with Josue Hector, MD.     Allergies:   Ivp dye [iodinated contrast media], Hydrocodone, Phentermine, Amlodipine, Lisinopril, and Red dye   Social History   Socioeconomic History   Marital status: Divorced  Spouse name: Not on file   Number of children: Not on file   Years of education: Not on file   Highest education level: Not on file  Occupational History   Not on file  Tobacco Use   Smoking status: Never   Smokeless tobacco: Never  Vaping Use   Vaping Use: Never used  Substance and Sexual Activity   Alcohol use: No    Alcohol/week: 0.0 standard drinks   Drug use: No   Sexual activity: Not on file  Other Topics Concern   Not on file  Social History Narrative   Not on file   Social Determinants of Health   Financial Resource Strain: Not on file  Food Insecurity: Not on file  Transportation Needs: Not on file  Physical Activity: Not on file  Stress: Not on file  Social Connections: Not on file     Family History: The patient's family history includes Aneurysm (age of onset: 55) in his father; Colon cancer in his sister. ROS:   Please see the history of present illness.     All other systems reviewed and are negative.  EKGs/Labs/Other Studies Reviewed:    The following studies were reviewed today:  Echocardiogram 02/06/2019 IMPRESSIONS   1. The left ventricle has  moderate-severely reduced systolic function, with an ejection fraction of 30-35%. The cavity size was mildly dilated. There is moderate concentric left ventricular hypertrophy. Left ventricular diastolic Doppler parameters are  consistent with impaired relaxation. Indeterminate filling pressures Left ventrical global hypokinesis without regional wall motion abnormalities.  2. The right ventricle has normal systolic function. The cavity was normal. There is no increase in right ventricular wall thickness.  3. The mitral valve is grossly normal. There is mild to moderate mitral annular calcification present.  4. The tricuspid valve is grossly normal.  5. The aortic valve was not well visualized.  6. The aortic root is normal in size and structure.  Echo Study Conclusions 01/2018 - Left ventricle: The cavity size was mildly dilated. Wall   thickness was increased in a pattern of mild LVH. Systolic   function was moderately reduced. The estimated ejection fraction   was in the range of 35% to 40%. Diffuse hypokinesis. There was no   evidence of elevated ventricular filling pressure by Doppler   parameters. - Aorta: Aortic root dimension: 44 mm (ED). - Ascending aorta: The ascending aorta was mildly dilated. - Left atrium: The atrium was mildly dilated. - Right ventricle: The cavity size was moderately dilated. Wall   thickness was normal. - Right atrium: The atrium was mildly dilated.   Impressions: - EF has mildly improved when compared to prior (25%).     Echo  02/23/17 Study Conclusions   - Left ventricle: The cavity size was severely dilated. Wall   thickness was increased in a pattern of moderate LVH. Systolic   function was severely reduced. The estimated ejection fraction   was in the range of 25% to 30%. Diffuse hypokinesis. Features are   consistent with a pseudonormal left ventricular filling pattern,   with concomitant abnormal relaxation and increased filling   pressure  (grade 2 diastolic dysfunction). Doppler parameters are   consistent with high ventricular filling pressure. - Aortic valve: There was trivial regurgitation. - Aortic root: The aortic root was mildly dilated. - Mitral valve: Calcified annulus. There was mild regurgitation. - Left atrium: The atrium was moderately dilated. - Right atrium: The atrium was moderately dilated.   Impressions:   -  Severe global reduction in LV systolic function; severe LVE;   moderate LVH; moderate diastolic dysfunction with elevated LV   filling pressure; mildly dilated aortic root; trace AI; mild MR;   biatrial enlargement.   Carotids  07/02/18 :  Plaque no stenosis f/u 2 years   EKG:  05/12/20 SR rate 69 LVH  Recent Labs: 06/28/2021: Magnesium 2.3 10/31/2021: B Natriuretic Peptide >4,500.0 11/29/2021: ALT 18; BUN 26; Creatinine, Ser 6.33; Hemoglobin 13.1; Platelets 143; Potassium 3.8; Sodium 138   Recent Lipid Panel    Component Value Date/Time   CHOL 148 08/01/2019 0844   TRIG 137 08/01/2019 0844   HDL 33 (L) 08/01/2019 0844   CHOLHDL 4.5 08/01/2019 0844   CHOLHDL 4.7 11/04/2014 1202   VLDL 38 11/04/2014 1202   LDLCALC 90 08/01/2019 0844   LDLDIRECT 80 07/18/2012 1010    Physical Exam:    VS:  There were no vitals taken for this visit.    Wt Readings from Last 3 Encounters:  11/29/21 189 lb 9.5 oz (86 kg)  09/23/21 188 lb 3.2 oz (85.4 kg)  08/19/21 193 lb 9.6 oz (87.8 kg)    Affect appropriate Healthy:  appears stated age HEENT: normal Neck supple with no adenopathy JVP normal no bruits no thyromegaly Lungs clear with no wheezing and good diaphragmatic motion Heart:  S1/S2 no murmur, no rub, gallop or click PMI normal Abdomen: benighn, BS positve, no tenderness, no AAA no bruit.  No HSM or HJR post nephrectomy large ventral hernia  Distal pulses intact with no bruits No edema Neuro non-focal Skin warm and dry LUE fistula with good thrill     PLAN:    In order of problems  listed above:  CHF: -EF was down to 25-30% in 02/2017.  Patient placed on optimal medical therapy.  EF improved 30-35% by TTE 05/18/21 with mild/mod MR and AV sclerosis  Myovue no ischemia likely diaphragmatic attenuation EF 39% 08/13/20  -Patient not on ACE/ARB/Entresto due to renal failure.  He continues on beta-blocker, hydralazine. Also still on lasix 80 mg BID, still makes some urine. Fluid balance per dialysis.  ESRD -Patient on hemodialysis Tuesday/Thursday/Saturday. -He was previously turned down for [facility].  Per prior notes patient is hopeful to get a kidney from a family member.  His cardiomyopathy may prohibit this from occurring.  Hypertension -On amlodipine 5 mg, carvedilol 25 mg, hydralazine 100 mg 3 times daily  Hyperlipidemia -On pravastatin 40 mg daily. -Last lipid panel in December 2020 LDL 90   Renal Cell CA -Status post left radical nephrectomy.   Prior CVA -On chronic Plavix.  Carotid arteries noted with mild plaque, no significant stenosis. 06/15/20   Ventral Hernia:  Large less reducible and painful Post surgery March 2023 f/u Dr Owens Shark Since surgery has had some abdominal pain and diarrhea Referred to [facility] and C diff sample rejected   Dyspnea:  chronic and worsening with multiple ER visits elevated BNP Hct ok Respiratory panel negative CTA with no PE but cephalization small effusion ? Ascites Severe OSA unable to tolerate CPAP.  Feels better on oxygen during dialysis but not prescribed by primary/pulmonary indicate sats ok Concern for refractory CHF and pulmonary HTN Volume trying to be controlled with dialysis He is on coreg,hydralazine and lasix no other GDMT for CHF given CRF. He refuses right and left heart cath at this time   F/U in 3 months    Signed, [name], MD  01/12/2022 3:27 PM  [facility]

## 2022-01-18 ENCOUNTER — Ambulatory Visit: Payer: Medicaid Other | Admitting: Cardiovascular Disease

## 2022-01-18 ENCOUNTER — Encounter: Payer: Self-pay | Admitting: Cardiovascular Disease

## 2022-02-08 ENCOUNTER — Telehealth: Payer: Self-pay | Admitting: *Deleted

## 2022-02-09 ENCOUNTER — Ambulatory Visit (INDEPENDENT_AMBULATORY_CARE_PROVIDER_SITE_OTHER): Payer: Medicare Other | Admitting: Internal Medicine

## 2022-02-09 ENCOUNTER — Telehealth: Payer: Self-pay

## 2022-02-09 VITALS — BP 124/62 | HR 88 | Temp 97.6°F | Wt 178.8 lb

## 2022-02-09 DIAGNOSIS — I5042 Chronic combined systolic (congestive) and diastolic (congestive) heart failure: Secondary | ICD-10-CM

## 2022-02-09 DIAGNOSIS — R0609 Other forms of dyspnea: Secondary | ICD-10-CM

## 2022-02-09 MED ORDER — ALBUTEROL SULFATE HFA 108 (90 BASE) MCG/ACT IN AERS: 2.0000 | INHALATION_SPRAY | Freq: Four times a day (QID) | RESPIRATORY_TRACT | 0 refills | Status: DC | PRN

## 2022-02-09 NOTE — Patient Instructions (Signed)
I am going to send you an albuterol inhaler to try however I really want you to see your pulmonologist Dr Melvyn Novas

## 2022-02-09 NOTE — Telephone Encounter (Signed)
Seems odd to have a home visit with a NP who cant prescribe medicine.  Agree he needs to come in for evaluation by [name] to prescribe medicine.

## 2022-02-09 NOTE — Telephone Encounter (Signed)
Patient called with complications of SOB , scheduled patient appointment for this afternoon at 1:45

## 2022-02-09 NOTE — Telephone Encounter (Signed)
  Looks like he did not show up.

## 2022-02-09 NOTE — Assessment & Plan Note (Signed)
Reports adherence to dialysis, his weight is actually down from the last few visits.  This appears stable and I do not think it is directly contributing to his dyspnea on exertion.

## 2022-02-09 NOTE — Progress Notes (Signed)
Established Patient Office Visit  Subjective   Patient ID: Charles Hart, male    DOB: 04/15/1956  Age: 66 y.o. MRN: [id number]  No chief complaint on file.   Patient presents today for evaluation of dyspnea on exertion as well as nocturnal dyspnea.  He notes that a nurse practitioner from his insurance company came out to his home yesterday but could not really do anything for him and noted that his pulse ox was normal.  Background to this is that he has had some progressive dyspnea on exertion without hypoxia for some time now.  He did discuss this with Dr. Melvyn Novas back in October 2022 and he was instructed to follow-up in 8 weeks but he is yet to follow-up.  He notes that he has recently borrowed a family member's albuterol inhaler which has helped as well as obtaining a over-the-counter Primatene Mist inhaler (epinephrine) which she also says helps some.  He has had no worsening of his cough no fevers. He has been adherent to dialysis reports last session was yesterday and notes completing a full session.       Objective:     Pulse 88   Wt 178 lb 12.8 oz (81.1 kg)   SpO2 100%   BMI 24.25 kg/m  BP Readings from Last 3 Encounters:  11/30/21 139/81  10/31/21 (!) 149/93  09/23/21 138/80   SpO2 Readings from Last 3 Encounters:  02/09/22 100%  11/30/21 96%  10/31/21 98%     Wt Readings from Last 5 Encounters:  02/09/22 178 lb 12.8 oz (81.1 kg)  11/29/21 189 lb 9.5 oz (86 kg)  09/23/21 188 lb 3.2 oz (85.4 kg)  08/19/21 193 lb 9.6 oz (87.8 kg)  06/21/21 194 lb 6.4 oz (88.2 kg)     Physical Exam Vitals and nursing note reviewed.  Constitutional:      Appearance: Normal appearance. He is not ill-appearing or toxic-appearing.  Cardiovascular:     Rate and Rhythm: Normal rate and regular rhythm.  Pulmonary:     Effort: Pulmonary effort is normal.     Breath sounds: Normal breath sounds. No wheezing or rhonchi.     Comments: Shallow inspiration, very slight crackles at  the bases bilaterally Neurological:     Mental Status: He is alert.      No results found for any visits on 02/09/22.  Last CBC Lab Results  Component Value Date   WBC 4.7 11/29/2021   HGB 13.1 11/29/2021   HCT 42.3 11/29/2021   MCV 97.0 11/29/2021   MCH 30.0 11/29/2021   RDW 15.9 (H) 11/29/2021   PLT 143 (L) 62/83/6629   Last metabolic panel Lab Results  Component Value Date   GLUCOSE 80 11/29/2021   NA 138 11/29/2021   K 3.8 11/29/2021   CL 100 11/29/2021   CO2 26 11/29/2021   BUN 26 (H) 11/29/2021   CREATININE 6.33 (H) 11/29/2021   GFRNONAA 9 (L) 11/29/2021   CALCIUM 9.1 11/29/2021   PHOS 3.1 05/20/2021   PROT 8.0 11/29/2021   ALBUMIN 4.0 11/29/2021   LABGLOB 3.0 11/18/2014   AGRATIO 0.8 11/18/2014   BILITOT 1.2 11/29/2021   ALKPHOS 78 11/29/2021   AST 19 11/29/2021   ALT 18 11/29/2021   ANIONGAP 12 11/29/2021      The ASCVD Risk score (Arnett DK, et al., 2019) failed to calculate for the following reasons:   The patient has a prior MI or stroke diagnosis    Assessment & Plan:  Problem List Items Addressed This Visit       Cardiovascular and Mediastinum   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  This appears stable and I do not think it is directly contributing to his dyspnea on exertion.        Other   DOE (dyspnea on exertion) - Primary    He does not appear grossly volume overloaded to me.  I do not appreciate any wheezing on exam.  He does have a rather shallow inspiration and I am concerned about deconditioning as a potential cause.  I really encouraged him and will refer him back to pulmonology to help and evaluate in this cause.  I am not sure that there truly is any obstructive pulmonary disease but I think would be a low harm to provide him with an albuterol rescue inhaler for temporary use until he sees  pulmonology.      Relevant Orders   Ambulatory referral to Pulmonology    Return in about 3 months (around 05/12/2022), or if symptoms worsen or fail to improve.    Lucious Groves, DO

## 2022-02-09 NOTE — Assessment & Plan Note (Signed)
He does not appear grossly volume overloaded to me.  I do not appreciate any wheezing on exam.  He does have a rather shallow inspiration and I am concerned about deconditioning as a potential cause.  I really encouraged him and will refer him back to pulmonology to help and evaluate in this cause.  I am not sure that there truly is any obstructive pulmonary disease but I think would be a low harm to provide him with an albuterol rescue inhaler for temporary use until he sees pulmonology.

## 2022-02-28 LAB — HEMOGLOBIN A1C: Hemoglobin A1C: 4.5

## 2022-03-03 ENCOUNTER — Other Ambulatory Visit: Payer: Self-pay

## 2022-03-03 ENCOUNTER — Encounter: Payer: Self-pay | Admitting: *Deleted

## 2022-03-03 MED ORDER — ALBUTEROL SULFATE HFA 108 (90 BASE) MCG/ACT IN AERS: 2.0000 | INHALATION_SPRAY | Freq: Four times a day (QID) | RESPIRATORY_TRACT | 0 refills | Status: DC | PRN

## 2022-03-03 MED ORDER — GABAPENTIN 300 MG PO CAPS: 300.0000 mg | ORAL_CAPSULE | Freq: Every day | ORAL | 1 refills | Status: DC

## 2022-03-30 ENCOUNTER — Telehealth: Payer: Self-pay

## 2022-03-30 NOTE — Telephone Encounter (Signed)
Patient came in to office this morning because he thought he had an appt today.  Pt unable to make appt tomorrow morning because of dialysis. Epic chatted Dr. Melvyn Novas and  was given permission to add patient on to Thursday 8/17 afternoon.  ATC patient LVMTCB

## 2022-03-31 ENCOUNTER — Ambulatory Visit: Payer: Medicaid Other | Admitting: Internal Medicine

## 2022-03-31 ENCOUNTER — Encounter: Payer: Self-pay | Admitting: Internal Medicine

## 2022-03-31 ENCOUNTER — Ambulatory Visit (INDEPENDENT_AMBULATORY_CARE_PROVIDER_SITE_OTHER): Payer: Medicare Other | Admitting: Internal Medicine

## 2022-03-31 DIAGNOSIS — R058 Other specified cough: Secondary | ICD-10-CM | POA: Diagnosis not present

## 2022-03-31 DIAGNOSIS — R0609 Other forms of dyspnea: Secondary | ICD-10-CM

## 2022-03-31 MED ORDER — FAMOTIDINE 20 MG PO TABS: ORAL_TABLET | ORAL | 11 refills | Status: DC

## 2022-03-31 MED ORDER — STIOLTO RESPIMAT 2.5-2.5 MCG/ACT IN AERS: 2.0000 | INHALATION_SPRAY | Freq: Every day | RESPIRATORY_TRACT | 0 refills | Status: DC

## 2022-03-31 MED ORDER — PANTOPRAZOLE SODIUM 40 MG PO TBEC: 40.0000 mg | DELAYED_RELEASE_TABLET | Freq: Every day | ORAL | 2 refills | Status: DC

## 2022-03-31 MED ORDER — PREDNISONE 10 MG PO TABS: ORAL_TABLET | ORAL | 0 refills | Status: DC

## 2022-03-31 MED ORDER — GABAPENTIN 300 MG PO CAPS: 300.0000 mg | ORAL_CAPSULE | Freq: Two times a day (BID) | ORAL | 1 refills | Status: DC

## 2022-03-31 NOTE — Assessment & Plan Note (Addendum)
Onset July 2022  - pt not sure whether better or not berfore or after HD as of 03/26/2021  - 03/26/2021 rec max rx for gerd, observe whether reported noct sob correlates with HD - 03/26/2021   Walked on RA x  3  lap(s) =  approx 150 @ moderate pace, stopped due to end of study with  min sob  with lowest 02 sats 97 - Echo 05/18/21 EF similar to echo done 08/13/20 . Left ventricular ejection fraction, by estimation, is 30 to 35%. The left ventricle has severely decreased  function. The left ventricle demonstrates global hypokinesis. The left  ventricular internal cavity size was  severely dilated. Left ventricular diastolic parameters were normal.  2. Right ventricular systolic function is mildly reduced. The right  ventricular size is mildly enlarged.  3. Left atrial size was moderately dilated.  4. Right atrial size was moderately dilated.  5. The mitral valve is abnormal. Mild to moderate mitral valve  regurgitation. No evidence of mitral stenosis.  6. The aortic valve is tricuspid. Aortic valve regurgitation is trivial.  Mild aortic valve sclerosis is present, with no evidence of aortic valve  stenosis.  Left Atrium: Left atrial size was moderately dilated. - 05/26/2021   Walked on RA  x  3  lap(s) =  approx 450 ft @ fast pace, stopped due to end of study, min sob last lap with lowest 02 sats 97%   - 03/31/2022  After extensive coaching inhaler device,  effectiveness =    75% with SMI > stiolto x 4 week sample  and pred trial and restart gerd rx    Suspect most of his symptoms are related to chf/cri though note Response to albuterol (? Due to "cardiac asthma"  is the only pos response he reports over the last year though I am doubtful it is real based on how poorly he demonstrated hfa technique but actually did better with smi (respimat) so gave him 4 weeks samples of stiolto.  Rec: Request for return for cxr asap See above for specifics of rx  F/u in 4 weeks with all meds in hand using a  trust but verify approach to confirm accurate Medication  Reconciliation The principal here is that until we are certain that the  patients are doing what we've asked, it makes no sense to ask them to do more.

## 2022-03-31 NOTE — Progress Notes (Addendum)
Charles Hart, male    DOB: Jul 18, 1956, 66 y.o.   MRN: [id number]    Brief patient profile:  66 yobm never smoker   referred to pulmonary clinic in [place]  03/26/2021 by Dr    Charles Hart for cough abrupt onset early July 2022 assoc with sob noct but not with adls    History of Present Illness  03/26/2021  Pulmonary/ 1st office eval/ Charles Hart / [facility]  Chief Complaint  Patient presents with   Pulmonary Consult    Referred by Dr. Donato Hart. Pt c/o non prod cough with SOB x 1 month.    Dyspnea:  MMRC1 = can walk nl pace, flat grade, can't hurry or go uphills or steps s sob   Cough: dry less when asleep  Sleep: sob worse  when lie down - can't tell worse on nights when not HD SABA use: none  Using lots of mints/ menthols due to sense of pnds  Rec Pantoprazole (protonix) 40 mg   Take  30-60 min before first meal of the day and Pepcid (famotidine)  20 mg after supper until return to office -  GERD diet reviewed, bed blocks rec   Pay attention to how you do with your breathing when you lie down on Sat night  vs Sun vs Monday Please schedule a follow up office visit in 8 weeks, call sooner if needed with all medications /inhalers/ solutions in hand   Date of Admission: 05/17/2021  2:26 PM Date of Discharge: 05/20/2021  7:02 PM   Discharge Diagnosis: 1. Acute exacerbation of heart failure  2. Chronic systolic and diastolic Heart Failure 3. ESRD on Hemodialysis 4. Hypertension 5. Hypokalemia 6. Obstructive Sleep Apnea 7. Normocytic anemia   05/26/2021  f/u ov/[place] office/Charles Hart re: cough x July 2022/noct sob  maint on nothing  Chief Complaint  Patient presents with   Follow-up    Cough has improved. Admitted into hosp 10/3-10/10 for SOB.    Dyspnea:  MMRC1 = can walk nl pace, flat grade, can't hurry or go uphills or steps s sob   Cough: "not a bid deal" ,  clears throat some still s mucus production  Sleeping: flat bed/ 1-2 pillows  HD T - Th - Sat  SABA  use: none  02: none  Covid status: vax x 2  Rec Continue Pantoprazole (protonix) 40 mg   Take  30-60 min before first meal of the day and Pepcid (famotidine)  20 mg after supper until return to office   GERD diet/ bed blocks    03/31/2022  f/u ov/ office/[name] re: CHF/ ESRF cough x  maint on no rx   Chief Complaint  Patient presents with   Follow-up    Pt states his cough is worse and it is now productive with white sputum.  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    Also denies any obvious fluctuation of symptoms with weather or environmental changes or other aggravating or alleviating factors except as outlined above   No unusual exposure hx or h/o childhood pna/ asthma or knowledge of premature birth.  Current Allergies, Complete Past Medical History, Past Surgical History, Family History, and Social History were reviewed in [employer] record.  ROS  The following are not active complaints unless bolded Hoarseness, sore throat, dysphagia, dental problems, itching, sneezing,  nasal congestion or discharge of excess mucus or purulent secretions, ear ache,   fever, chills, sweats, unintended wt loss or wt gain, classically pleuritic or exertional cp,  orthopnea pnd or arm/hand swelling  or leg swelling, presyncope, palpitations, abdominal pain, anorexia, nausea, vomiting, diarrhea  or change  in bowel habits or change in bladder habits, change in stools or change in urine, dysuria, hematuria,  rash, arthralgias, visual complaints, headache, numbness, weakness or ataxia or problems with walking or coordination,  change in mood or  memory.        Current Meds  - - NOTE:   Unable to verify as accurately reflecting what pt takes    Medication Sig   acetaminophen (TYLENOL) 325 MG tablet Take 2 tablets (650 mg total) by mouth every 6 (six) hours as needed for mild pain.   albuterol (VENTOLIN HFA) 108 (90 Base) MCG/ACT inhaler Inhale 2 puffs into the lungs every 6 (six) hours as needed for wheezing or shortness of breath.   calcium acetate (PHOSLO) 667 MG capsule Take 2,001 mg by mouth 3 (three) times daily with meals.    carvedilol (COREG) 25 MG tablet Says not taking    Cholecalciferol (VITAMIN D3) 1.25 MG (50000 UT) CAPS Take 1 capsule by mouth once a week.   fluticasone (FLONASE) 50 MCG/ACT nasal spray Place 2 sprays into both nostrils as needed for allergies.   folic acid-vitamin b complex-vitamin c-selenium-zinc (DIALYVITE) 3 MG TABS tablet Take 1 tablet by mouth daily.   furosemide (LASIX) 80 MG tablet Take 1 tablet (80 mg total) by mouth every other day.   gabapentin (NEURONTIN) 300 MG capsule Take 1 capsule (300 mg total) by mouth at bedtime.   meclizine (ANTIVERT) 12.5 MG tablet Take 1 tablet (12.5 mg total) by mouth 3 (three) times daily as needed for dizziness.   pravastatin (PRAVACHOL) 40 MG tablet Take 1 tablet (40 mg total) by mouth daily.   SUPER B COMPLEX/C PO Take 1 tablet by mouth daily.               Past Medical History:  Diagnosis Date   Adenomatous colon polyp 07/02/2011   Last colonoscopy May 06, 2011 by Charles Hart, who recommended repeat colonoscopy in 5 years.    Anemia    Background diabetic retinopathy 04/20/2012   Patient is followed by Charles Hart    Cancer [facility])    Kidney   Cardiomyopathy    LV function improved from 2004 to 2008.   Historically, moderately dilated LV with EF 30-40% by 2D echo 08/14/2002.  Mild CAD with severe LV dysfunction by cardiac cath 09/2002.  Normal coronary arteries and normal LV function by cardiac cath 09/19/2006.  A 2-D echo on 04/01/2009 showed mild concentric hypertrophy and normal systolic (LVEF  93-23%) and doppler C/W with grade 1 diastolic dysfunction.   CHF (congestive heart failure) (HCC)    LV function improved from 2004 to 2008.  Historically, moderately dilated LV with EF  30-40% by 2D echo 08/14/2002.  Mild CAD with severe LV dysfunction by cardiac cath 09/2002.  Normal coronary arteries and normal LV function by cardiac cath 09/19/2006.  A 2-D echo on 04/01/2009 showed mild concentric hypertrophy and normal systolic (LVEF  67-61%) and doppler C/W with grade 1 diastolic dysfunction..    Chronic combined systolic and diastolic congestive heart failure ([place]) 05/21/2010   LV function improved from 2004 to 2008.  Historically, moderately dilated LV with EF 30-40% by 2D echo 08/14/2002.  Mild CAD with severe LV dysfunction by cardiac cath 09/2002.  Normal coronary arteries and normal LV function by cardiac cath 09/19/2006.  A 2-D echo on 04/01/2009 showed mild concentric hypertrophy and normal systolic (LVEF  95-09%) and doppler parameters consistent with abnormal left    CVA (cerebral vascular accident) ([place]) 07/04/2012   MRI of the brain 07/04/2012 showed an acute infarct in the right basal ganglia involving the anterior putamen, anterior limb internal capsule, and head of the caudate; this measured approximately 2.5 cm in diameter.      Dermatitis    Diabetes mellitus    type 2   DIABETIC PERIPHERAL NEUROPATHY 08/03/2007   Qualifier: Diagnosis of  By: Marinda Elk MD, Sonia Side     DM neuropathy, painful ([place])    fingers and right knee   ESRD (end stage renal disease) ([place])    Stage 4, on hemodailysis x 4 months as of 05-18-18 [facility], tues thurs sat   ESRD (end stage renal disease) on dialysis  ([place])    "TTS; [facility]" (05/31/2018)   Hearing loss in right ear    Hyperlipidemia    Hypertension    Hypertensive crisis 07/28/2012   Hypertensive urgency 08/20/2014   Myocardial infarction [facility])  many yrs ago   Nephrotic syndrome 02/18/2013   A 24-hour urine collection 03/04/2013 showed total protein of 5,460 g and creatinine clearance of 80 mL/minute.  Patient was seen by Seward Meth at [place] and a repeat 24-hour urine showed 10,407 mg protein.  Patient underwent kidney biopsy on 05/30/2013; pathology showed advanced diffuse and nodular diabetic nephropathy with vascular changes consistent with long-standing difficult to control hypertension.      Pneumonia 2014 and 2015   Renal insufficiency    Sleep apnea    11/06/20 - hasn't used it "in a while"        Objective:       03/31/2022       176   05/26/21 200 lb 1.9 oz (90.8 kg)  05/20/21 194 lb 7.1 oz (88.2 kg)  05/06/21 205 lb 14.4 oz (93.4 kg)    Vital signs reviewed  03/31/2022  - Note at rest 02 sats  97% on RA   General appearance:    amb bm, spont dry sounding cough / nasal tone to voice    HEENT : Oropharynx  clear/ no pnd     Nasal turbinates mild non-specific edema    NECK :  without  apparent JVD/ palpable Nodes/TM    LUNGS: no acc muscle use,  Nl contour chest  with a few insp crackles bases bilaterally without reproducible cough on insp or exp maneuvers   CV:  RRR  no s3 or murmur or increase in P2, and no edema   ABD:  obese soft and nontender with nl inspiratory excursion in the supine position. No bruits or organomegaly appreciated   MS:  Nl gait/ ext warm without deformities Or obvious joint restrictions  calf tenderness,  cyanosis or clubbing    SKIN: warm and dry without lesions    NEURO:  alert, approp, nl sensorium with  no motor or cerebellar deficits apparent.  CXR PA and Lateral:   03/31/2022 :    I personally reviewed images and impression is as follows:      Did not go for cxr as req      Assessment

## 2022-03-31 NOTE — Patient Instructions (Addendum)
Pantoprazole (protonix) 40 mg   Take  30-60 min before first meal of the day and Pepcid (famotidine)  20 mg after supper until return to office - this is the best way to tell whether stomach acid is contributing to your problem.    GERD (REFLUX)  is an extremely common cause of respiratory symptoms just like yours , many times with no obvious heartburn at all.    It can be treated with medication, but also with lifestyle changes including elevation of the head of your bed (ideally with 6 -8inch blocks under the headboard of your bed),  Smoking cessation, avoidance of late meals, excessive alcohol, and avoid fatty foods, chocolate, peppermint, colas, red wine, and acidic juices such as orange juice.  NO MINT OR MENTHOL PRODUCTS SO NO COUGH DROPS  USE SUGARLESS CANDY INSTEAD (Jolley ranchers or Stover's or Life Savers) or even ice chips will also do - the key is to swallow to prevent all throat clearing. NO OIL BASED VITAMINS - use powdered substitutes.  Avoid fish oil when coughing.   Increase gabapentin to 300 mg one  twice daily   Prednisone 10 mg take  4 each am x 2 days,   2 each am x 2 days,  1 each am x 2 days and stop   Please remember to go to the  x-ray department  @  [facility] for your tests - we will call you with the results when they are available     Please schedule a follow up office visit in 4 weeks, sooner if needed  with all medications /inhalers/ solutions in hand so we can verify exactly what you are taking. This includes all medications from all doctors and over the counters

## 2022-03-31 NOTE — Assessment & Plan Note (Addendum)
Onset July 2022 rx with mint/menthol - 03/26/2021 rec no mint/menthol and rx with max gerd rx x 6 weeks then ov > did not follow instructions - 03/31/2022 try again same rx but increase gabapentin to 300 mg bid  Plus >>> also so added 6 day taper off  Prednisone starting at 40 mg per day in case of component of Th-2 driven upper or lower airways inflammation (if cough responds short term only to relapse before return while will on full rx for uacs (as above), then  that would point to allergic rhinitis/ asthma or eos bronchitis as alternative dx)    Upper airway cough syndrome (previously labeled PNDS),  is so named because it's frequently impossible to sort out how much is  CR/sinusitis with freq throat clearing (which can be related to primary GERD)   vs  causing  secondary (" extra esophageal")  GERD from wide swings in gastric pressure that occur with throat clearing, often  promoting self use of mint and menthol lozenges that reduce the lower esophageal sphincter tone and exacerbate the problem further in a cyclical fashion.   These are the same pts (now being labeled as having "irritable larynx syndrome" by some cough centers) who not infrequently have a history of having failed to tolerate ace inhibitors,  dry powder inhalers or biphosphonates or report having atypical/extraesophageal reflux symptoms that don't respond to standard doses of PPI  and are easily confused as having aecopd or asthma flares by even experienced allergists/ pulmonologists (myself included).   F/u q 4 weeks until we have a better handle on these non-specific symptoms         Each maintenance medication was reviewed in detail including emphasizing most importantly the difference between maintenance and prns and under what circumstances the prns are to be triggered using an action plan format where appropriate.  Total time for H and P, chart review, counseling, reviewing hfa/smi device(s) and generating customized AVS  unique to this office visit / same day charting > 30 min for multiple  refractory respiratory  symptoms of uncertain etiology

## 2022-04-02 ENCOUNTER — Other Ambulatory Visit: Payer: Self-pay | Admitting: Internal Medicine

## 2022-04-04 NOTE — Telephone Encounter (Signed)
Just saw dr wert who notes that he changed him to stiolto

## 2022-04-13 ENCOUNTER — Ambulatory Visit: Payer: Medicaid Other | Admitting: Internal Medicine

## 2022-04-15 ENCOUNTER — Telehealth: Payer: Self-pay | Admitting: *Deleted

## 2022-04-15 NOTE — Telephone Encounter (Signed)
Call from [facility] with [facility] who provides in-home services) requesting pt's last A1C which was 7/17 @ [facility] - 4.5.

## 2022-04-26 NOTE — Progress Notes (Deleted)
CC: follow up  HPI:  Mr.Male E Oaxaca is a 66 y.o. with medical history of HTN, HLD, DMII, ESRD on HD, CHF and COPD presenting to [facility] for a follow up.  Please see problem-based list for further details, assessments, and plans.  Past Medical History:  Diagnosis Date   Adenomatous colon polyp 07/02/2011   Last colonoscopy May 06, 2011 by Dr. Owens Loffler, who recommended repeat colonoscopy in 5 years.    Anemia    Background diabetic retinopathy 04/20/2012   Patient is followed by Dr. Katy Fitch    Cancer [facility])    Kidney   Cardiomyopathy    LV function improved from 2004 to 2008.  Historically, moderately dilated LV with EF 30-40% by 2D echo 08/14/2002.  Mild CAD with severe LV dysfunction by cardiac cath 09/2002.  Normal coronary arteries and normal LV function by cardiac cath 09/19/2006.  A 2-D echo on 04/01/2009 showed mild concentric hypertrophy and normal systolic (LVEF  69-48%) and doppler C/W with grade 1 diastolic dysfunction.   CHF (congestive heart failure) (HCC)    LV function improved from 2004 to 2008.  Historically, moderately dilated LV with EF 30-40% by 2D echo 08/14/2002.  Mild CAD with severe LV dysfunction by cardiac cath 09/2002.  Normal coronary arteries and normal LV function by cardiac cath 09/19/2006.  A 2-D echo on 04/01/2009 showed mild concentric hypertrophy and normal systolic (LVEF  54-62%) and doppler C/W with grade 1 diastolic dysfunction..    Chronic combined systolic and diastolic congestive heart failure ([place]) 05/21/2010   LV function improved from 2004 to 2008.  Historically, moderately dilated LV with EF 30-40% by 2D echo 08/14/2002.  Mild CAD with severe LV dysfunction by cardiac cath 09/2002.  Normal coronary arteries and normal LV function by cardiac cath 09/19/2006.  A 2-D echo on 04/01/2009 showed mild concentric hypertrophy and normal systolic (LVEF  70-35%) and doppler parameters consistent with abnormal left    CVA (cerebral vascular accident) ([place])  07/04/2012   MRI of the brain 07/04/2012 showed an acute infarct in the right basal ganglia involving the anterior putamen, anterior limb internal capsule, and head of the caudate; this measured approximately 2.5 cm in diameter.      Dermatitis    Diabetes mellitus    type 2   DIABETIC PERIPHERAL NEUROPATHY 08/03/2007   Qualifier: Diagnosis of  By: Marinda Elk MD, Sonia Side     DM neuropathy, painful ([place])    fingers and right knee   ESRD (end stage renal disease) ([place])    Stage 4, on hemodailysis x 4 months as of 05-18-18 [place] [facility], tues thurs sat   ESRD (end stage renal disease) on dialysis ([place])    "TTS; [facility]" (05/31/2018)   Hearing loss in right ear    Hyperlipidemia    Hypertension    Hypertensive crisis 07/28/2012   Hypertensive urgency 08/20/2014   Myocardial infarction [facility])  many yrs ago   Nephrotic syndrome 02/18/2013   A 24-hour urine collection 03/04/2013 showed total protein of 5,460 g and creatinine clearance of 80 mL/minute.  Patient was seen by Seward Meth at [place] and a repeat 24-hour urine showed 10,407 mg protein.  Patient underwent kidney biopsy on 05/30/2013; pathology showed advanced diffuse and nodular diabetic nephropathy with vascular changes consistent with long-standing difficult to control hypertension.      Pneumonia 2014 and 2015   Renal insufficiency    Sleep apnea    11/06/20 - hasn't used it "in  a while"   Surgical pneumoperitoneum    Trochanteric bursitis of right hip 04/25/2018    Current Outpatient Medications (Endocrine & Metabolic):    predniSONE (DELTASONE) 10 MG tablet, Take  4 each am x 2 days,   2 each am x 2 days,  1 each am x 2 days and stop  Current Outpatient Medications (Cardiovascular):    furosemide (LASIX) 80 MG tablet, Take 1 tablet (80 mg total) by mouth every other day.   hydrALAZINE (APRESOLINE) 100 MG tablet, Take 1 tablet (100 mg total) by mouth 3 (three) times daily.   pravastatin  (PRAVACHOL) 40 MG tablet, Take 1 tablet (40 mg total) by mouth daily.  Current Outpatient Medications (Respiratory):    albuterol (VENTOLIN HFA) 108 (90 Base) MCG/ACT inhaler, Inhale 2 puffs into the lungs every 6 (six) hours as needed for wheezing or shortness of breath.   fluticasone (FLONASE) 50 MCG/ACT nasal spray, Place 2 sprays into both nostrils as needed for allergies.   Tiotropium Bromide-Olodaterol (STIOLTO RESPIMAT) 2.5-2.5 MCG/ACT AERS, Inhale 2 puffs into the lungs daily.   Tiotropium Bromide-Olodaterol (STIOLTO RESPIMAT) 2.5-2.5 MCG/ACT AERS, Inhale 2 puffs into the lungs daily.  Current Outpatient Medications (Analgesics):    acetaminophen (TYLENOL) 325 MG tablet, Take 2 tablets (650 mg total) by mouth every 6 (six) hours as needed for mild pain.   Current Outpatient Medications (Other):    calcium acetate (PHOSLO) 667 MG capsule, Take 2,001 mg by mouth 3 (three) times daily with meals.    Cholecalciferol (VITAMIN D3) 1.25 MG (50000 UT) CAPS, Take 1 capsule by mouth once a week.   famotidine (PEPCID) 20 MG tablet, One after supper   folic acid-vitamin b complex-vitamin c-selenium-zinc (DIALYVITE) 3 MG TABS tablet, Take 1 tablet by mouth daily.   gabapentin (NEURONTIN) 300 MG capsule, Take 1 capsule (300 mg total) by mouth 2 (two) times daily.   meclizine (ANTIVERT) 12.5 MG tablet, Take 1 tablet (12.5 mg total) by mouth 3 (three) times daily as needed for dizziness.   pantoprazole (PROTONIX) 40 MG tablet, Take 1 tablet (40 mg total) by mouth daily. Take 30-60 min before first meal of the day   SUPER B COMPLEX/C PO, Take 1 tablet by mouth daily.  Review of Systems:  Review of system negative unless stated in the problem list or HPI.    Physical Exam:  There were no vitals filed for this visit.  Physical Exam General: NAD HENT: NCAT Lungs: CTAB, no wheeze, rhonchi or rales.  Cardiovascular: Normal heart sounds, no r/m/g, 2+ pulses in all extremities. No LE edema Abdomen:  No TTP, normal bowel sounds MSK: No asymmetry or muscle atrophy.  Skin: no lesions noted on exposed skin Neuro: Alert and oriented x4. CN grossly intact Psych: Normal mood and normal affect   Assessment & Plan:   No problem-specific Assessment & Plan notes found for this encounter.   See Encounters Tab for problem based charting.  Patient discussed with Dr. {NAMES:[id number]::"Guilloud","Hoffman","Mullen","Narendra","Vincent","Machen","Lau","Hatcher"} Idamae Schuller, MD Tillie Rung. [facility] Internal Medicine Residency, PGY-2   Referred to pulmonology.

## 2022-04-27 ENCOUNTER — Encounter: Payer: Medicaid Other | Admitting: Internal Medicine

## 2022-04-29 ENCOUNTER — Ambulatory Visit: Payer: Medicaid Other | Admitting: Internal Medicine

## 2022-04-29 NOTE — Progress Notes (Deleted)
[name], male    DOB: 02/17/1956, 66 y.o.   MRN: [id number]    Brief patient profile:  66 yobm never smoker   referred to pulmonary clinic in [place]  03/26/2021 by Dr    Marval Regal for cough abrupt onset early July 2022 assoc with sob noct but not with adls    History of Present Illness  03/26/2021  Pulmonary/ 1st office eval/ Alissa Pharr / [facility]  Chief Complaint  Patient presents with   Pulmonary Consult    Referred by Dr. Donato Heinz. Pt c/o non prod cough with SOB x 1 month.    Dyspnea:  MMRC1 = can walk nl pace, flat grade, can't hurry or go uphills or steps s sob   Cough: dry less when asleep  Sleep: sob worse  when lie down - can't tell worse on nights when not HD SABA use: none  Using lots of mints/ menthols due to sense of pnds  Rec Pantoprazole (protonix) 40 mg   Take  30-60 min before first meal of the day and Pepcid (famotidine)  20 mg after supper until return to office -  GERD diet reviewed, bed blocks rec   Pay attention to how you do with your breathing when you lie down on Sat night  vs Sun vs Monday Please schedule a follow up office visit in 8 weeks, call sooner if needed with all medications /inhalers/ solutions in hand   Date of Admission: 05/17/2021  2:26 PM Date of Discharge: 05/20/2021  7:02 PM   Discharge Diagnosis: 1. Acute exacerbation of heart failure  2. Chronic systolic and diastolic Heart Failure 3. ESRD on Hemodialysis 4. Hypertension 5. Hypokalemia 6. Obstructive Sleep Apnea 7. Normocytic anemia   05/26/2021  f/u ov/[place] office/Anil Havard re: cough x July 2022/noct sob  maint on nothing  Chief Complaint  Patient presents with   Follow-up    Cough has improved. Admitted into hosp 10/3-10/10 for SOB.    Dyspnea:  MMRC1 = can walk nl pace, flat grade, can't hurry or go uphills or steps s sob   Cough: "not a bid deal" ,  clears throat some still s mucus production  Sleeping: flat bed/ 1-2 pillows  HD T - Th - Sat  SABA  use: none  02: none  Covid status: vax x 2  Rec Continue Pantoprazole (protonix) 40 mg   Take  30-60 min before first meal of the day and Pepcid (famotidine)  20 mg after supper until return to office   GERD diet/ bed blocks    03/31/2022  f/u ov/[place] office/Hafsah Hendler re: CHF/ ESRF cough x  maint on no rx   Chief Complaint  Patient presents with   Follow-up    Pt states his cough is worse and it is now productive with white sputum.  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   Cough: min white / assoc with watery rhinitis since cough  Sleeping: 45 degrees  - any less says can't breath SABA use: has ventolin helps breathing and cough some esp p supper despite hfa nearly 0% effective technique 02: none  Covid status: vax x 2/ never infected  Rec Pantoprazole (protonix) 40 mg   Take  30-60 min before first meal of the day and Pepcid (famotidine)  20 mg after supper until return to  office  GERD reflux  Increase gabapentin to 300 mg one  twice daily  Prednisone 10 mg take  4 each am x 2 days,   2 each am x 2 days,  1 each am x 2 days and stop   Please schedule a follow up office visit  with all medications /inhalers/ solutions in hand     04/29/2022  f/u ov/[place] office/Dwyane Dupree re: *** maint on ***  - needs cxr  No chief complaint on file.   Dyspnea:  *** Cough: *** Sleeping: *** SABA use: *** 02: *** Covid status: *** Lung cancer screening: ***   No obvious day to day or daytime variability or assoc excess/ purulent sputum or mucus plugs or hemoptysis or cp or chest tightness, subjective wheeze or overt sinus or hb symptoms.   *** without nocturnal  or early am exacerbation  of respiratory  c/o's or need for noct saba. Also denies any obvious fluctuation of symptoms with weather or environmental changes or other aggravating or alleviating factors except  as outlined above   No unusual exposure hx or h/o childhood pna/ asthma or knowledge of premature birth.  Current Allergies, Complete Past Medical History, Past Surgical History, Family History, and Social History were reviewed in [employer] record.  ROS  The following are not active complaints unless bolded Hoarseness, sore throat, dysphagia, dental problems, itching, sneezing,  nasal congestion or discharge of excess mucus or purulent secretions, ear ache,   fever, chills, sweats, unintended wt loss or wt gain, classically pleuritic or exertional cp,  orthopnea pnd or arm/hand swelling  or leg swelling, presyncope, palpitations, abdominal pain, anorexia, nausea, vomiting, diarrhea  or change in bowel habits or change in bladder habits, change in stools or change in urine, dysuria, hematuria,  rash, arthralgias, visual complaints, headache, numbness, weakness or ataxia or problems with walking or coordination,  change in mood or  memory.        No outpatient medications have been marked as taking for the 04/29/22 encounter (Appointment) with Tanda Rockers, MD.                 Past Medical History:  Diagnosis Date   Adenomatous colon polyp 07/02/2011   Last colonoscopy May 06, 2011 by Dr. Owens Loffler, who recommended repeat colonoscopy in 5 years.    Anemia    Background diabetic retinopathy 04/20/2012   Patient is followed by Dr. Katy Fitch    Cancer [facility])    Kidney   Cardiomyopathy    LV function improved from 2004 to 2008.  Historically, moderately dilated LV with EF 30-40% by 2D echo 08/14/2002.  Mild CAD with severe LV dysfunction by cardiac cath 09/2002.  Normal coronary arteries and normal LV function by cardiac cath 09/19/2006.  A 2-D echo on 04/01/2009 showed mild concentric hypertrophy and normal systolic (LVEF  11-91%) and doppler C/W with grade 1 diastolic dysfunction.   CHF (congestive heart failure) (HCC)    LV function improved from 2004 to 2008.   Historically, moderately dilated LV with EF 30-40% by 2D echo 08/14/2002.  Mild CAD with severe LV dysfunction by cardiac cath 09/2002.  Normal coronary arteries and normal LV function by cardiac cath 09/19/2006.  A 2-D echo on 04/01/2009 showed mild concentric hypertrophy and normal systolic (LVEF  47-82%) and doppler C/W with grade 1 diastolic dysfunction..    Chronic combined systolic and diastolic congestive heart failure ([place]) 05/21/2010   LV function improved from 2004 to 2008.  Historically, moderately dilated LV with EF 30-40% by 2D echo 08/14/2002.  Mild CAD with severe LV dysfunction by cardiac cath 09/2002.  Normal coronary arteries and normal LV function by cardiac cath 09/19/2006.  A 2-D echo on 04/01/2009 showed mild concentric hypertrophy and normal systolic (LVEF  67-12%) and doppler parameters consistent with abnormal left    CVA (cerebral vascular accident) ([place]) 07/04/2012   MRI of the brain 07/04/2012 showed an acute infarct in the right basal ganglia involving the anterior putamen, anterior limb internal capsule, and head of the caudate; this measured approximately 2.5 cm in diameter.      Dermatitis    Diabetes mellitus    type 2   DIABETIC PERIPHERAL NEUROPATHY 08/03/2007   Qualifier: Diagnosis of  By: Marinda Elk MD, Sonia Side     DM neuropathy, painful ([place])    fingers and right knee   ESRD (end stage renal disease) ([place])    Stage 4, on hemodailysis x 4 months as of 05-18-18 [place] [facility], tues thurs sat   ESRD (end stage renal disease) on dialysis ([place])    "TTS; [facility]" (05/31/2018)   Hearing loss in right ear    Hyperlipidemia    Hypertension    Hypertensive crisis 07/28/2012   Hypertensive urgency 08/20/2014   Myocardial infarction [facility])  many yrs ago   Nephrotic syndrome 02/18/2013   A 24-hour urine collection 03/04/2013 showed total protein of 5,460 g and creatinine clearance of 80 mL/minute.  Patient was seen by Seward Meth at [place] and  a repeat 24-hour urine showed 10,407 mg protein.  Patient underwent kidney biopsy on 05/30/2013; pathology showed advanced diffuse and nodular diabetic nephropathy with vascular changes consistent with long-standing difficult to control hypertension.      Pneumonia 2014 and 2015   Renal insufficiency    Sleep apnea    11/06/20 - hasn't used it "in a while"        Objective:    wts  04/29/2022        ***   03/31/2022       176   05/26/21 200 lb 1.9 oz (90.8 kg)  05/20/21 194 lb 7.1 oz (88.2 kg)  05/06/21 205 lb 14.4 oz (93.4 kg)    Vital signs reviewed  04/29/2022  - Note at rest 02 sats  ***% on ***   General appearance:    ***         CXR PA and Lateral:   03/31/2022 :    I personally reviewed images and impression is as follows:     Did not go for cxr as req      Assessment

## 2022-05-16 ENCOUNTER — Other Ambulatory Visit: Payer: Self-pay

## 2022-05-16 MED ORDER — HYDRALAZINE HCL 100 MG PO TABS
100.0000 mg | ORAL_TABLET | Freq: Three times a day (TID) | ORAL | 2 refills | Status: DC
Start: 2022-05-16 — End: 2022-08-11

## 2022-05-16 MED ORDER — FUROSEMIDE 80 MG PO TABS
80.0000 mg | ORAL_TABLET | ORAL | 1 refills | Status: DC
Start: 2022-05-16 — End: 2022-07-01

## 2022-05-16 NOTE — Telephone Encounter (Addendum)
REFILL REQUEST ON  furosemide (LASIX) 80 MG tablet and   hydrALAZINE (APRESOLINE) 100 MG tablet (Expired).

## 2022-05-17 NOTE — Telephone Encounter (Signed)
Will defer to Dr Melvyn Novas on this refill as dose was recently changed by him

## 2022-05-22 ENCOUNTER — Encounter (HOSPITAL_COMMUNITY): Payer: Self-pay

## 2022-05-22 ENCOUNTER — Emergency Department (HOSPITAL_COMMUNITY)
Admission: EM | Admit: 2022-05-22 | Discharge: 2022-05-22 | Disposition: A | Discharge status: Home / Self Care | Payer: Medicare Other | Attending: Emergency Medicine | Admitting: Emergency Medicine

## 2022-05-22 DIAGNOSIS — N3000 Acute cystitis without hematuria: Secondary | ICD-10-CM | POA: Insufficient documentation

## 2022-05-22 DIAGNOSIS — R300 Dysuria: Secondary | ICD-10-CM | POA: Diagnosis present

## 2022-05-22 DIAGNOSIS — I10 Essential (primary) hypertension: Secondary | ICD-10-CM | POA: Diagnosis not present

## 2022-05-22 LAB — URINALYSIS, ROUTINE W REFLEX MICROSCOPIC
Bilirubin Urine: NEGATIVE
Glucose, UA: NEGATIVE mg/dL
Ketones, ur: NEGATIVE mg/dL
Nitrite: NEGATIVE
Protein, ur: 100 mg/dL — AB
Specific Gravity, Urine: 1.009 (ref 1.005–1.030)
WBC, UA: >50 WBC/hpf — ABNORMAL HIGH (ref 0–5)
pH: 8 (ref 5.0–8.0)

## 2022-05-22 LAB — BASIC METABOLIC PANEL
Anion gap: 11 (ref 5–15)
BUN: 39 mg/dL — ABNORMAL HIGH (ref 8–23)
CO2: 24 mmol/L (ref 22–32)
Calcium: 9 mg/dL (ref 8.9–10.3)
Chloride: 100 mmol/L (ref 98–111)
Creatinine, Ser: 6.93 mg/dL — ABNORMAL HIGH (ref 0.61–1.24)
GFR, Estimated: 8 mL/min — ABNORMAL LOW (ref >60)
Glucose, Bld: 94 mg/dL (ref 70–99)
Potassium: 4.8 mmol/L (ref 3.5–5.1)
Sodium: 135 mmol/L (ref 135–145)

## 2022-05-22 LAB — CBC
HCT: 37.3 % — ABNORMAL LOW (ref 39.0–52.0)
Hemoglobin: 11.9 g/dL — ABNORMAL LOW (ref 13.0–17.0)
MCH: 30.1 pg (ref 26.0–34.0)
MCHC: 31.9 g/dL (ref 30.0–36.0)
MCV: 94.4 fL (ref 80.0–100.0)
Platelets: 123 10*3/uL — ABNORMAL LOW (ref 150–400)
RBC: 3.95 MIL/uL — ABNORMAL LOW (ref 4.22–5.81)
RDW: 14.2 % (ref 11.5–15.5)
WBC: 4.2 10*3/uL (ref 4.0–10.5)
nRBC: 0 % (ref 0.0–0.2)

## 2022-05-22 MED ORDER — CEPHALEXIN 500 MG PO CAPS: 500.0000 mg | ORAL_CAPSULE | Freq: Three times a day (TID) | ORAL | 0 refills | Status: AC

## 2022-05-22 MED ORDER — DOXYCYCLINE HYCLATE 100 MG PO TABS
100.0000 mg | ORAL_TABLET | Freq: Once | ORAL | Status: AC
  Administered 2022-05-22: 100 mg via ORAL
  Filled 2022-05-22: qty 1

## 2022-05-22 NOTE — Discharge Instructions (Signed)
The testing today shows that you have a urinary infection, this involves your bladder, you will need to take the medication called cephalexin 3 times a day for the next 7 days.  If you are developing worsening fevers vomiting pain or any other severe or worsening symptoms you should come back to the emergency department immediately however I do want you to follow-up with your doctor within 48 hours for recheck

## 2022-05-22 NOTE — ED Triage Notes (Signed)
Pt reports pain in his scrotum. Unknown of when it started. Pt states the pain is getting worse and worse. And it is getting harder to urinate.   Last dialysis yesterday

## 2022-05-22 NOTE — ED Provider Notes (Signed)
[facility] EMERGENCY DEPARTMENT Provider Note   CSN: [id number] Arrival date & time: 05/22/22  1628     History  Chief Complaint  Patient presents with   Testicle Pain    Charles Hart is a 66 y.o. male.   Testicle Pain   This patient is a 66 year old male, he is on dialysis Tuesday Thursday and Saturday, reports that he also has hypertension, he has acid reflux, he is not a diabetic.  He reports that he still makes urine and has been making urine for the last couple of years though over time it has been gradually less and less.  He comes to the hospital today complaining of discomfort with urinating but also having discomfort in his testicles which is also intermittent, seems to be left more than right, he feels like it has been going on for several weeks and after discussing his condition with one of his fellow dialysis patients they told him to start taking an over-the-counter prostate medication which she does not think is helping.  He is not having fevers or chills, he is not seeing blood and he has no discharge from his penis.  He has not had a sexual transmitted disease since he was 66 years old.  He denies abdominal pain coughing or shortness of breath    Home Medications Prior to Admission medications   Medication Sig Start Date End Date Taking? Authorizing Provider  acetaminophen (TYLENOL) 325 MG tablet Take 2 tablets (650 mg total) by mouth every 6 (six) hours as needed for mild pain. 11/18/20  Yes Norm Parcel, PA-C  albuterol (VENTOLIN HFA) 108 (90 Base) MCG/ACT inhaler Inhale 2 puffs into the lungs every 6 (six) hours as needed for wheezing or shortness of breath. 03/03/22  Yes Aldine Contes, MD  calcium acetate (PHOSLO) 667 MG capsule Take 2,001 mg by mouth 3 (three) times daily with meals.  12/15/17  Yes La Barge, Caren Griffins, MD  cephALEXin (KEFLEX) 500 MG capsule Take 1 capsule (500 mg total) by mouth 3 (three) times daily for 7 days. 05/22/22 05/29/22 Yes Noemi Chapel, MD  fluticasone [facility]) 50 MCG/ACT nasal spray Place 2 sprays into both nostrils as needed for allergies. 09/27/18  Yes Lucious Groves, DO  folic acid-vitamin b complex-vitamin c-selenium-zinc (DIALYVITE) 3 MG TABS tablet Take 1 tablet by mouth daily.   Yes [provider]  furosemide (LASIX) 80 MG tablet Take 1 tablet (80 mg total) by mouth every other day. Patient taking differently: Take 80 mg by mouth daily. 05/16/22  Yes Lucious Groves, DO  gabapentin (NEURONTIN) 300 MG capsule Take 1 capsule (300 mg total) by mouth 2 (two) times daily. 03/31/22  Yes Tanda Rockers, MD  hydrALAZINE (APRESOLINE) 100 MG tablet Take 1 tablet (100 mg total) by mouth 3 (three) times daily. 05/16/22  Yes Lucious Groves, DO  OVER THE COUNTER MEDICATION Super beta prostate   Yes [provider]  pantoprazole (PROTONIX) 40 MG tablet Take 1 tablet (40 mg total) by mouth daily. Take 30-60 min before first meal of the day 03/31/22  Yes Tanda Rockers, MD  Tiotropium Bromide-Olodaterol (STIOLTO RESPIMAT) 2.5-2.5 MCG/ACT AERS Inhale 2 puffs into the lungs daily. 03/31/22  Yes Tanda Rockers, MD      Allergies    Ivp dye [iodinated contrast media], Hydrocodone, Phentermine, Amlodipine, Lisinopril, and Red dye    Review of Systems   Review of Systems  Genitourinary:  Positive for testicular pain.  All other systems  reviewed and are negative.   Physical Exam Updated Vital Signs BP 133/87 (BP Location: Right Arm)   Pulse 79   Temp 97.7 F (36.5 C) (Oral)   Resp (!) 22   Ht 1.753 m ('5\' 9"'$ )   Wt 79.4 kg   SpO2 100%   BMI 25.84 kg/m  Physical Exam Vitals and nursing note reviewed.  Constitutional:      General: He is not in acute distress.    Appearance: He is well-developed.     Comments: No inguinal lymphadenopathy or tenderness  HENT:     Head: Normocephalic and atraumatic.     Mouth/Throat:     Pharynx: No oropharyngeal exudate.  Eyes:     General: No scleral icterus.        Right eye: No discharge.        Left eye: No discharge.     Conjunctiva/sclera: Conjunctivae normal.     Pupils: Pupils are equal, round, and reactive to light.  Neck:     Thyroid: No thyromegaly.     Vascular: No JVD.  Cardiovascular:     Rate and Rhythm: Normal rate and regular rhythm.     Heart sounds: Normal heart sounds. No murmur heard.    No friction rub. No gallop.     Comments: Fistula right upper extremity in good repair good thrill no redness no fever no tenderness Pulmonary:     Effort: Pulmonary effort is normal. No respiratory distress.     Breath sounds: Normal breath sounds. No wheezing or rales.  Abdominal:     General: Bowel sounds are normal. There is no distension.     Palpations: Abdomen is soft. There is no mass.     Tenderness: There is no abdominal tenderness.     Comments: No inguinal hernias present  Genitourinary:    Comments: Chaperone present for exam, normal-appearing penis, normal-appearing scrotum and testicles however there is tenderness in the bilateral testicles, no obvious masses are palpated, normal cremasteric reflex bilaterally, no discharge at the head of the penis Musculoskeletal:        General: No tenderness. Normal range of motion.     Cervical back: Normal range of motion and neck supple.     Right lower leg: No edema.     Left lower leg: No edema.  Lymphadenopathy:     Cervical: No cervical adenopathy.  Skin:    General: Skin is warm and dry.     Findings: No erythema or rash.  Neurological:     Mental Status: He is alert.     Coordination: Coordination normal.  Psychiatric:        Behavior: Behavior normal.     ED Results / Procedures / Treatments   Labs (all labs ordered are listed, but only abnormal results are displayed) Labs Reviewed  URINALYSIS, ROUTINE W REFLEX MICROSCOPIC - Abnormal; Notable for the following components:      Result Value   APPearance HAZY (*)    Hgb urine dipstick SMALL (*)    Protein, ur 100 (*)     Leukocytes,Ua MODERATE (*)    WBC, UA >50 (*)    Bacteria, UA MANY (*)    All other components within normal limits  CBC - Abnormal; Notable for the following components:   RBC 3.95 (*)    Hemoglobin 11.9 (*)    HCT 37.3 (*)    Platelets 123 (*)    All other components within normal limits  BASIC METABOLIC PANEL -  Abnormal; Notable for the following components:   BUN 39 (*)    Creatinine, Ser 6.93 (*)    GFR, Estimated 8 (*)    All other components within normal limits  URINE CULTURE    EKG None  Radiology No results found.  Procedures Procedures    Medications Ordered in ED Medications  doxycycline (VIBRA-TABS) tablet 100 mg (100 mg Oral Given 05/22/22 1712)    ED Course/ Medical Decision Making/ A&P                           Medical Decision Making Amount and/or Complexity of Data Reviewed Labs: ordered.  Risk Prescription drug management.   Likely has some degree of orchitis or epididymitis, this could also be a hydrocele, this does not appear to be consistent with a torsed testicle especially given the timeframe, it is bilateral in nature and has been going on for a month, there is no redness or swelling and he has a normal cremasteric reflex.  We will check basic labs and urinalysis and some doxycycline just in case it is epididymitis  Labs are pretty unremarkable, expected renal dysfunction, no significant leukocytosis, urinalysis reveals lots of white blood cells and bacteria consistent with a UTI, culture sent, antibiotic started, cephalexin for home  I have discussed with the patient at the bedside the results, and the meaning of these results.  They have expressed her understanding to the need for follow-up with primary care physician        Final Clinical Impression(s) / ED Diagnoses Final diagnoses:  Acute cystitis without hematuria    Rx / DC Orders ED Discharge Orders          Ordered    cephALEXin (KEFLEX) 500 MG capsule  3 times daily         05/22/22 1955              Noemi Chapel, MD 05/22/22 1956

## 2022-05-22 NOTE — ED Notes (Signed)
Pt d/c home per MD order. Discharge summary reviewed, pt verbalizes understanding. No s/s of acute distress noted at discharge.  

## 2022-05-24 LAB — URINE CULTURE

## 2022-05-26 ENCOUNTER — Ambulatory Visit (INDEPENDENT_AMBULATORY_CARE_PROVIDER_SITE_OTHER): Payer: Medicare Other | Admitting: Internal Medicine

## 2022-05-26 ENCOUNTER — Encounter: Payer: Self-pay | Admitting: Internal Medicine

## 2022-05-26 ENCOUNTER — Other Ambulatory Visit: Payer: Self-pay

## 2022-05-26 VITALS — BP 125/54 | HR 84 | Temp 97.5°F | Ht 72.0 in | Wt 198.9 lb

## 2022-05-26 DIAGNOSIS — E1122 Type 2 diabetes mellitus with diabetic chronic kidney disease: Secondary | ICD-10-CM | POA: Diagnosis not present

## 2022-05-26 DIAGNOSIS — I132 Hypertensive heart and chronic kidney disease with heart failure and with stage 5 chronic kidney disease, or end stage renal disease: Secondary | ICD-10-CM

## 2022-05-26 DIAGNOSIS — I5042 Chronic combined systolic (congestive) and diastolic (congestive) heart failure: Secondary | ICD-10-CM | POA: Diagnosis not present

## 2022-05-26 DIAGNOSIS — E1140 Type 2 diabetes mellitus with diabetic neuropathy, unspecified: Secondary | ICD-10-CM

## 2022-05-26 DIAGNOSIS — Z992 Dependence on renal dialysis: Secondary | ICD-10-CM

## 2022-05-26 DIAGNOSIS — N186 End stage renal disease: Secondary | ICD-10-CM

## 2022-05-26 DIAGNOSIS — I10 Essential (primary) hypertension: Secondary | ICD-10-CM

## 2022-05-26 NOTE — Assessment & Plan Note (Addendum)
On diabetic foot exam today and noticed his left leg is noticeably colder than the right we will get point-of-care ABIs here. He reports his last A1c check with HD his A1c was 4.8% thus he is doing well and diet controlled.  IN office ABI ok however he is high risk would like to get U/S ABI to be sure.

## 2022-05-26 NOTE — Assessment & Plan Note (Signed)
Reports he was instructed to increase fluid intake in the emergency department to prevent UTIs.  I told him this is not the case for him given he has ESRD and that he should restrict fluid intake.  Also complaining about his EDW and cramping again reinforced and he needs to follow directions and decreased fluid intake did not have as much taken off.  Also instructed him to decrease cephalexin to twice daily dosing given his ESRD

## 2022-05-26 NOTE — Assessment & Plan Note (Signed)
Has some mild lower extremity edema but reports he is due for dialysis and will go later in the day.

## 2022-05-26 NOTE — Assessment & Plan Note (Signed)
His blood pressure is well controlled today at 125/54 no changes.

## 2022-05-26 NOTE — Progress Notes (Signed)
Established Patient Office Visit  Subjective   Patient ID: Charles Hart, male    DOB: Jun 28, 1956  Age: 66 y.o. MRN: [id number]  Chief Complaint  Patient presents with   Check-up Visit    C/o pinched nerve in his back. ED F/U - Forestine Na; on abx's.   He presented to [facility] emergency department for testicular pain was diagnosed with UTI and discharged on cephalexin 500 3 times daily.  Reports pain has resolved still on antibiotic therapy.  Slipped and fell in August at work.  Has been doing physical therapy. Reports he has a order for an MRI.  On extra light duty right now. Has been seeing a urgent care physician.   Saw Dr Melvyn Novas for follow up, gabapentin dose increased. And changed him to [employer].  No excessive sedation or balance issues on increased dose of gabapentin.  Feels cough is better and SOB is also much better.    Objective:     BP (!) 125/54 (BP Location: Right Arm, Patient Position: Sitting, Cuff Size: Normal)   Pulse 84   Temp (!) 97.5 F (36.4 C) (Oral)   Ht 6' (1.829 m)   Wt 198 lb 14.4 oz (90.2 kg)   SpO2 98% Comment: RA  BMI 26.98 kg/m  BP Readings from Last 3 Encounters:  05/26/22 (!) 125/54  05/22/22 (!) 149/89  03/31/22 124/68   Wt Readings from Last 3 Encounters:  05/26/22 198 lb 14.4 oz (90.2 kg)  05/22/22 175 lb (79.4 kg)  03/31/22 176 lb 9.6 oz (80.1 kg)   SpO2 Readings from Last 3 Encounters:  05/26/22 98%  05/22/22 98%  03/31/22 97%      Physical Exam Vitals and nursing note reviewed.  Constitutional:      Appearance: Normal appearance.  Cardiovascular:     Pulses:          Dorsalis pedis pulses are 1+ on the right side and 1+ on the left side.       Posterior tibial pulses are 2+ on the right side and 1+ on the left side.     Comments: Left leg noticably cooler to touch compared to right, diminished PT and DP pulses in left. Musculoskeletal:     Right lower leg: Edema (1+) present.     Left lower leg: Edema (1+) present.   Neurological:     Mental Status: He is alert.      No results found for any visits on 05/26/22.  Last CBC Lab Results  Component Value Date   WBC 4.2 05/22/2022   HGB 11.9 (L) 05/22/2022   HCT 37.3 (L) 05/22/2022   MCV 94.4 05/22/2022   MCH 30.1 05/22/2022   RDW 14.2 05/22/2022   PLT 123 (L) 78/24/2353   Last metabolic panel Lab Results  Component Value Date   GLUCOSE 94 05/22/2022   NA 135 05/22/2022   K 4.8 05/22/2022   CL 100 05/22/2022   CO2 24 05/22/2022   BUN 39 (H) 05/22/2022   CREATININE 6.93 (H) 05/22/2022   GFRNONAA 8 (L) 05/22/2022   CALCIUM 9.0 05/22/2022   PHOS 3.1 05/20/2021   PROT 8.0 11/29/2021   ALBUMIN 4.0 11/29/2021   LABGLOB 3.0 11/18/2014   AGRATIO 0.8 11/18/2014   BILITOT 1.2 11/29/2021   ALKPHOS 78 11/29/2021   AST 19 11/29/2021   ALT 18 11/29/2021   ANIONGAP 11 05/22/2022      The ASCVD Risk score (Arnett DK, et al., 2019) failed to calculate for  the following reasons:   The patient has a prior MI or stroke diagnosis    Assessment & Plan:   Problem List Items Addressed This Visit       Cardiovascular and Mediastinum   Chronic combined systolic and diastolic congestive heart failure (HCC) (Chronic)    Has some mild lower extremity edema but reports he is due for dialysis and will go later in the day.      Essential (primary) hypertension    His blood pressure is well controlled today at 125/54 no changes.        Endocrine   Type 2 diabetes mellitus with diabetic neuropathy (HCC) - Primary   Relevant Orders   Lipid Profile   POCT ABI Screening Pilot No Charge   Controlled type 2 diabetes mellitus with chronic kidney disease ([place])    On diabetic foot exam today and noticed his left leg is noticeably colder than the right we will get point-of-care ABIs here. He reports his last A1c check with HD his A1c was 4.8% thus he is doing well and diet controlled.        Genitourinary   ESRD on hemodialysis [facility])    Reports he  was instructed to increase fluid intake in the emergency department to prevent UTIs.  I told him this is not the case for him given he has ESRD and that he should restrict fluid intake.  Also complaining about his EDW and cramping again reinforced and he needs to follow directions and decreased fluid intake did not have as much taken off.  Also instructed him to decrease cephalexin to twice daily dosing given his ESRD       Return in about 6 months (around 11/25/2022).    Lucious Groves, DO

## 2022-05-27 LAB — LIPID PANEL
Chol/HDL Ratio: 2.5 ratio (ref 0.0–5.0)
Cholesterol, Total: 129 mg/dL (ref 100–199)
HDL: 51 mg/dL (ref >39)
LDL Chol Calc (NIH): 66 mg/dL (ref 0–99)
Triglycerides: 53 mg/dL (ref 0–149)
VLDL Cholesterol Cal: 12 mg/dL (ref 5–40)

## 2022-06-06 ENCOUNTER — Ambulatory Visit (HOSPITAL_COMMUNITY): Payer: Medicare Other | Attending: Internal Medicine

## 2022-06-10 ENCOUNTER — Ambulatory Visit (INDEPENDENT_AMBULATORY_CARE_PROVIDER_SITE_OTHER): Payer: Medicare Other | Admitting: Internal Medicine

## 2022-06-10 ENCOUNTER — Encounter: Payer: Self-pay | Admitting: Internal Medicine

## 2022-06-10 DIAGNOSIS — R0609 Other forms of dyspnea: Secondary | ICD-10-CM

## 2022-06-10 DIAGNOSIS — R058 Other specified cough: Secondary | ICD-10-CM | POA: Diagnosis not present

## 2022-06-10 MED ORDER — STIOLTO RESPIMAT 2.5-2.5 MCG/ACT IN AERS: INHALATION_SPRAY | RESPIRATORY_TRACT | 11 refills | Status: DC

## 2022-06-10 NOTE — Patient Instructions (Signed)
Start back on stiolto 2 puffs each am x 2 weeks and then stop it to see what difference you notice and if there is none, don't fill the prescription  My office will be contacting you by phone for referral for pfts   - if you don't hear back from my office within one week please call us back or notify us thru MyChart and we'll address it right away.   Pulmonary follow up will be if needed for respiratory problems

## 2022-06-10 NOTE — Progress Notes (Signed)
Charles Hart, male    DOB: 1956-01-15, 66 y.o.   MRN: [id number]    Brief patient profile:  14 yobm never smoker   referred to pulmonary clinic in [place]  03/26/2021 by Dr    Marval Regal for cough abrupt onset early July 2022 assoc with sob noct but not with adls    History of Present Illness  03/26/2021  Pulmonary/ 1st office eval/ Merilyn Pagan / [facility] Office  Chief Complaint  Patient presents with   Pulmonary Consult    Referred by Dr. Donato Heinz. Pt c/o non prod cough with SOB x 1 month.    Dyspnea:  MMRC1 = can walk nl pace, flat grade, can't hurry or go uphills or steps s sob   Cough: dry less when asleep  Sleep: sob worse  when lie down - can't tell worse on nights when not HD SABA use: none  Using lots of mints/ menthols due to sense of pnds  Rec Pantoprazole (protonix) 40 mg   Take  30-60 min before first meal of the day and Pepcid (famotidine)  20 mg after supper until return to office -  GERD diet reviewed, bed blocks rec   Pay attention to how you do with your breathing when you lie down on Sat night  vs Sun vs Monday Please schedule a follow up office visit in 8 weeks, call sooner if needed with all medications /inhalers/ solutions in hand   Date of Admission: 05/17/2021  2:26 PM Date of Discharge: 05/20/2021  7:02 PM   Discharge Diagnosis: 1. Acute exacerbation of heart failure  2. Chronic systolic and diastolic Heart Failure 3. ESRD on Hemodialysis 4. Hypertension 5. Hypokalemia 6. Obstructive Sleep Apnea 7. Normocytic anemia   05/26/2021  f/u ov/[place] office/Vashon Riordan re: cough x July 2022/noct sob  maint on nothing  Chief Complaint  Patient presents with   Follow-up    Cough has improved. Admitted into hosp 10/3-10/10 for SOB.    Dyspnea:  MMRC1 = can walk nl pace, flat grade, can't hurry or go uphills or steps s sob   Cough: "not a bid deal" ,  clears throat some still s mucus production  Sleeping: flat bed/ 1-2 pillows  HD T - Th - Sat  SABA  use: none  02: none  Covid status: vax x 2  Rec Continue Pantoprazole (protonix) 40 mg   Take  30-60 min before first meal of the day and Pepcid (famotidine)  20 mg after supper until return to office   GERD diet/ bed blocks    03/31/2022  f/u ov/[place] office/Atley Scarboro re: CHF/ ESRF cough x  maint on no rx   Chief Complaint  Patient presents with   Follow-up    Pt states his cough is worse and it is now productive with white sputum.  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   Cough: min white / assoc with watery rhinitis since cough  Sleeping: 45 degrees  - any less says can't breath SABA use: has ventolin helps breathing and cough some esp p supper despite hfa nearly 0% effective technique 02: none  Covid status: vax x 2/ never infected  Rec Pantoprazole (protonix) 40 mg   Take  30-60 min before first meal of the day and Pepcid (famotidine)  20 mg after supper until return to  office GERD diet  Increase gabapentin to 300 mg one  twice daily  Prednisone 10 mg take  4 each am x 2 days,   2 each am x 2 days,  1 each am x 2 days and stop  Please remember to go to the  x-ray department > did not go  Please schedule a follow up office visit in 4 weeks, sooner if needed  with all medications /inhalers/ solutions in hand     06/10/2022  f/u ov/[place] office/Shanika Levings re: CHF /ESRF on HD T-Th -SaT maint on stiolto but never filled rx and not sure helped or ever took it Artist Complaint  Patient presents with   Follow-up    Breathing doing better    Dyspnea:  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   Sleeping as above without nocturnal  or early am exacerbation  of respiratory  c/o's or need for noct saba. Also denies any obvious fluctuation of symptoms with weather or environmental changes or other aggravating or alleviating factors except as outlined above   No unusual exposure hx or h/o childhood pna/ asthma or knowledge of premature birth.  Current Allergies, Complete Past Medical History, Past Surgical History, Family History, and Social History were reviewed in [employer] record.  ROS  The following are not active complaints unless bolded Hoarseness, sore throat, dysphagia, dental problems, itching, sneezing,  nasal congestion or discharge of excess mucus or purulent secretions, ear ache,   fever, chills, sweats, unintended wt loss or wt gain, classically pleuritic or exertional cp,  orthopnea pnd or arm/hand swelling  or leg swelling, presyncope, palpitations, abdominal pain, anorexia, nausea, vomiting, diarrhea  or change in bowel habits or change in bladder habits, change in stools or change in urine, dysuria, hematuria,  rash, arthralgias, visual complaints, headache, numbness, weakness or ataxia or problems with walking or coordination,  change in mood or  memory.        Current Meds  Medication Sig   acetaminophen (TYLENOL) 325 MG tablet Take 2 tablets (650 mg total) by mouth every 6 (six) hours as needed for mild pain.   albuterol (VENTOLIN HFA) 108 (90 Base) MCG/ACT inhaler Inhale 2 puffs into the lungs every 6 (six) hours as needed for wheezing or shortness of breath.   calcium acetate (PHOSLO) 667 MG capsule Take 2,001 mg by mouth 3 (three) times daily with meals.    fluticasone (FLONASE) 50 MCG/ACT nasal spray Place 2 sprays into both nostrils as needed for allergies.   folic acid-vitamin b complex-vitamin c-selenium-zinc (DIALYVITE) 3 MG TABS tablet Take 1 tablet by mouth daily.   furosemide (LASIX) 80 MG tablet Take 1  tablet (80 mg total) by mouth every other day. (Patient taking differently: Take 80 mg by mouth daily.)   gabapentin (NEURONTIN) 300 MG capsule Take 1 capsule (300 mg total) by mouth 2 (two) times daily.   hydrALAZINE (APRESOLINE) 100 MG tablet Take 1 tablet (100 mg total) by mouth 3 (three) times daily.   OVER THE COUNTER MEDICATION Super beta prostate   pantoprazole (PROTONIX) 40 MG tablet Take 1 tablet (40 mg total)  by mouth daily. Take 30-60 min before first meal of the day   Tiotropium Bromide-Olodaterol (STIOLTO RESPIMAT) 2.5-2.5 MCG/ACT AERS Inhale 2 puffs into the lungs daily.                 Past Medical History:  Diagnosis Date   Adenomatous colon polyp 07/02/2011   Last colonoscopy May 06, 2011 by Dr. Owens Loffler, who recommended repeat colonoscopy in 5 years.    Anemia    Background diabetic retinopathy 04/20/2012   Patient is followed by Dr. Katy Fitch    Cancer [facility])    Kidney   Cardiomyopathy    LV function improved from 2004 to 2008.  Historically, moderately dilated LV with EF 30-40% by 2D echo 08/14/2002.  Mild CAD with severe LV dysfunction by cardiac cath 09/2002.  Normal coronary arteries and normal LV function by cardiac cath 09/19/2006.  A 2-D echo on 04/01/2009 showed mild concentric hypertrophy and normal systolic (LVEF  25-36%) and doppler C/W with grade 1 diastolic dysfunction.   CHF (congestive heart failure) (HCC)    LV function improved from 2004 to 2008.  Historically, moderately dilated LV with EF 30-40% by 2D echo 08/14/2002.  Mild CAD with severe LV dysfunction by cardiac cath 09/2002.  Normal coronary arteries and normal LV function by cardiac cath 09/19/2006.  A 2-D echo on 04/01/2009 showed mild concentric hypertrophy and normal systolic (LVEF  64-40%) and doppler C/W with grade 1 diastolic dysfunction..    Chronic combined systolic and diastolic congestive heart failure ([place]) 05/21/2010   LV function improved from 2004 to 2008.  Historically, moderately  dilated LV with EF 30-40% by 2D echo 08/14/2002.  Mild CAD with severe LV dysfunction by cardiac cath 09/2002.  Normal coronary arteries and normal LV function by cardiac cath 09/19/2006.  A 2-D echo on 04/01/2009 showed mild concentric hypertrophy and normal systolic (LVEF  34-74%) and doppler parameters consistent with abnormal left    CVA (cerebral vascular accident) ([place]) 07/04/2012   MRI of the brain 07/04/2012 showed an acute infarct in the right basal ganglia involving the anterior putamen, anterior limb internal capsule, and head of the caudate; this measured approximately 2.5 cm in diameter.      Dermatitis    Diabetes mellitus    type 2   DIABETIC PERIPHERAL NEUROPATHY 08/03/2007   Qualifier: Diagnosis of  By: Marinda Elk MD, Sonia Side     DM neuropathy, painful ([place])    fingers and right knee   ESRD (end stage renal disease) ([place])    Stage 4, on hemodailysis x 4 months as of 05-18-18 [facility], tues thurs sat   ESRD (end stage renal disease) on dialysis ([place])    "TTS; [facility]" (05/31/2018)   Hearing loss in right ear    Hyperlipidemia    Hypertension    Hypertensive crisis 07/28/2012   Hypertensive urgency 08/20/2014   Myocardial infarction [facility])  many yrs ago   Nephrotic syndrome 02/18/2013   A 24-hour urine collection 03/04/2013 showed total protein of 5,460 g and creatinine clearance of 80 mL/minute.  Patient was seen by Seward Meth at [place] and a repeat 24-hour urine showed 10,407 mg protein.  Patient underwent kidney biopsy on 05/30/2013; pathology showed advanced diffuse and nodular diabetic nephropathy with vascular changes consistent with long-standing difficult to control hypertension.      Pneumonia 2014 and 2015   Renal insufficiency    Sleep apnea    11/06/20 - hasn't used it "in a while"  Objective:     06/10/2022     190   03/31/2022       176   05/26/21 200 lb 1.9 oz (90.8 kg)  05/20/21 194 lb 7.1 oz (88.2 kg)   05/06/21 205 lb 14.4 oz (93.4 kg)    Vital signs reviewed  06/10/2022  - Note at rest 02 sats  94% on RA   General appearance:    chronically ill amb bm nad    HEENT : Oropharynx  clear/ edentulous      Nasal turbinates nl    NECK :  without  apparent JVD/ palpable Nodes/TM    LUNGS: no acc muscle use,  Nl contour chest which is clear to A and P bilaterally without cough on insp or exp maneuvers   CV:  RRR  no s3 or murmur or increase in P2, and no edema   ABD:  soft and nontender with nl inspiratory excursion in the supine position. No bruits or organomegaly appreciated   MS:  Nl gait/ ext warm without deformities Or obvious joint restrictions  calf tenderness, cyanosis or clubbing    SKIN: warm and dry without lesions    NEURO:  alert, approp, nl sensorium with  no motor or cerebellar deficits apparent.           Assessment

## 2022-06-10 NOTE — Assessment & Plan Note (Signed)
Onset July 2022  - pt not sure whether better or not berfore or after HD as of 03/26/2021  - 03/26/2021 rec max rx for gerd, observe whether reported noct sob correlates with HD - 03/26/2021   Walked on RA x  3  lap(s) =  approx 150 @ moderate pace, stopped due to end of study with  min sob  with lowest 02 sats 97 - Echo 05/18/21 EF similar to echo done 08/13/20 . Left ventricular ejection fraction, by estimation, is 30 to 35%. The left ventricle has severely decreased  function. The left ventricle demonstrates global hypokinesis. The left  ventricular internal cavity size was  severely dilated. Left ventricular diastolic parameters were normal.  2. Right ventricular systolic function is mildly reduced. The right  ventricular size is mildly enlarged.  3. Left atrial size was moderately dilated.  4. Right atrial size was moderately dilated.  5. The mitral valve is abnormal. Mild to moderate mitral valve  regurgitation. No evidence of mitral stenosis.  6. The aortic valve is tricuspid. Aortic valve regurgitation is trivial.  Mild aortic valve sclerosis is present, with no evidence of aortic valve  stenosis.  Left Atrium: Left atrial size was moderately dilated. - 05/26/2021   Walked on RA  x  3  lap(s) =  approx 450 ft @ fast pace, stopped due to end of study, min sob last lap with lowest 02 sats 97%   - 03/31/2022    stiolto and pred trial and restart gerd rx  f/u in 4 weeks with all meds in hand  - 06/10/2022  After extensive coaching inhaler device,  effectiveness =    75% with SMI > rechallenge with stiolto x 2 weeks then try off pending /fu pfts   Not really clear at all he needs stiolto so rx sample only and f/u prn abn pfts or true need for stiolto         Each maintenance medication was reviewed in detail including emphasizing most importantly the difference between maintenance and prns and under what circumstances the prns are to be triggered using an action plan format where  appropriate.  Total time for H and P, chart review, counseling, reviewing smi device(s) and generating customized AVS unique to this office visit / same day charting = 27 min

## 2022-06-10 NOTE — Assessment & Plan Note (Signed)
Onset July 2022 rx with mint/menthol - 03/26/2021 rec no mint/menthol and rx with max gerd rx x 6 weeks then ov > did not follow instructions - 03/31/2022 try again same rx but increase gabapentin to 300 mg bid  and f/u in 4 weeks  Cough has resolved to his satisfaction ? Whether really needs stiolto for either cough or doe so try on x 2 weeks then off and f/u if finds really needs to stay on it for either problem

## 2022-06-15 ENCOUNTER — Ambulatory Visit (HOSPITAL_COMMUNITY): Payer: Medicare Other | Attending: Vascular Surgery

## 2022-06-26 ENCOUNTER — Other Ambulatory Visit: Payer: Self-pay | Admitting: Internal Medicine

## 2022-06-27 ENCOUNTER — Ambulatory Visit (HOSPITAL_COMMUNITY)
Admission: RE | Admit: 2022-06-27 | Discharge: 2022-06-27 | Disposition: A | Discharge status: Home / Self Care | Payer: Medicare Other | Source: Ambulatory Visit | Attending: Internal Medicine | Admitting: Internal Medicine

## 2022-06-27 DIAGNOSIS — E1140 Type 2 diabetes mellitus with diabetic neuropathy, unspecified: Secondary | ICD-10-CM | POA: Insufficient documentation

## 2022-06-29 ENCOUNTER — Encounter: Payer: Self-pay | Admitting: Internal Medicine

## 2022-06-29 DIAGNOSIS — I739 Peripheral vascular disease, unspecified: Secondary | ICD-10-CM | POA: Insufficient documentation

## 2022-06-30 ENCOUNTER — Telehealth: Payer: Self-pay | Admitting: *Deleted

## 2022-06-30 NOTE — Telephone Encounter (Signed)
Patient called in stating he is running out of lasix since he was told to take 1 tab daily. Rx written 10/2 was 1 tab every other day. If patient is to take daily, please send new Rx with updated Sig and qty. Thank you.

## 2022-07-01 MED ORDER — FUROSEMIDE 80 MG PO TABS: 80.0000 mg | ORAL_TABLET | Freq: Every day | ORAL | 2 refills | Status: DC

## 2022-07-14 ENCOUNTER — Other Ambulatory Visit: Payer: Self-pay

## 2022-07-15 MED ORDER — GABAPENTIN 300 MG PO CAPS: 300.0000 mg | ORAL_CAPSULE | Freq: Two times a day (BID) | ORAL | 2 refills | Status: DC

## 2022-07-15 NOTE — Telephone Encounter (Signed)
Refill gabapentin x 3 m then be sure ov before next refill

## 2022-07-15 NOTE — Addendum Note (Signed)
Addended by: Rosana Berger on: 07/15/2022 02:02 PM   Modules accepted: Orders

## 2022-07-15 NOTE — Telephone Encounter (Signed)
Dr Melvyn Novas is prescribing this medication

## 2022-07-27 ENCOUNTER — Telehealth: Payer: Self-pay | Admitting: Internal Medicine

## 2022-07-27 ENCOUNTER — Telehealth: Payer: Self-pay

## 2022-07-27 MED ORDER — PRAVASTATIN SODIUM 40 MG PO TABS: 40.0000 mg | ORAL_TABLET | Freq: Every evening | ORAL | 3 refills | Status: DC

## 2022-07-27 NOTE — Telephone Encounter (Signed)
Patient called he is requesting a work note stating he is supposed to be sitting down and that he is on light duty restrictions. Patient is also requesting a call back

## 2022-07-27 NOTE — Telephone Encounter (Signed)
Able to get a hold of [employer]olt today, discussed results of ABI, recommended resuming pravastatin '40mg'$  daily.  He will do this.  Also let me know he had an injury at work and will drop off  paperwork soon.  Also told him to fill out records request form for MRI and any other doctors he saw after the injury.

## 2022-07-27 NOTE — Telephone Encounter (Signed)
I discussed this with him a little earlier, I really need to get the records of what the other doctors he has been seening for workers comp have said and his MRI of his back

## 2022-07-27 NOTE — Telephone Encounter (Signed)
Pt called back requesting a note for work by Friday.

## 2022-08-11 ENCOUNTER — Other Ambulatory Visit: Payer: Self-pay

## 2022-08-11 MED ORDER — HYDRALAZINE HCL 100 MG PO TABS: 100.0000 mg | ORAL_TABLET | Freq: Three times a day (TID) | ORAL | 2 refills | Status: DC

## 2022-10-01 ENCOUNTER — Other Ambulatory Visit: Payer: Self-pay | Admitting: Internal Medicine

## 2022-10-07 LAB — LAB REPORT - SCANNED: A1c: 4.6

## 2022-10-10 DIAGNOSIS — I5020 Unspecified systolic (congestive) heart failure: Secondary | ICD-10-CM | POA: Insufficient documentation

## 2022-10-15 ENCOUNTER — Other Ambulatory Visit: Payer: Self-pay | Admitting: Student

## 2022-10-17 ENCOUNTER — Other Ambulatory Visit: Payer: Self-pay

## 2022-10-17 MED ORDER — HYDRALAZINE HCL 100 MG PO TABS: 100.0000 mg | ORAL_TABLET | Freq: Three times a day (TID) | ORAL | 2 refills | Status: DC

## 2022-10-18 ENCOUNTER — Encounter: Payer: Self-pay | Admitting: Dietician

## 2022-11-17 ENCOUNTER — Other Ambulatory Visit: Payer: Self-pay

## 2022-11-24 ENCOUNTER — Ambulatory Visit (INDEPENDENT_AMBULATORY_CARE_PROVIDER_SITE_OTHER): Payer: 59

## 2022-11-24 ENCOUNTER — Ambulatory Visit (INDEPENDENT_AMBULATORY_CARE_PROVIDER_SITE_OTHER): Payer: 59 | Admitting: Internal Medicine

## 2022-11-24 ENCOUNTER — Other Ambulatory Visit: Payer: Self-pay

## 2022-11-24 ENCOUNTER — Encounter: Payer: Self-pay | Admitting: Internal Medicine

## 2022-11-24 VITALS — BP 133/64 | HR 81 | Temp 97.8°F | Ht 72.0 in | Wt 199.1 lb

## 2022-11-24 DIAGNOSIS — I10 Essential (primary) hypertension: Secondary | ICD-10-CM

## 2022-11-24 DIAGNOSIS — N2581 Secondary hyperparathyroidism of renal origin: Secondary | ICD-10-CM

## 2022-11-24 DIAGNOSIS — R058 Other specified cough: Secondary | ICD-10-CM

## 2022-11-24 DIAGNOSIS — I779 Disorder of arteries and arterioles, unspecified: Secondary | ICD-10-CM

## 2022-11-24 DIAGNOSIS — N186 End stage renal disease: Secondary | ICD-10-CM

## 2022-11-24 DIAGNOSIS — E1140 Type 2 diabetes mellitus with diabetic neuropathy, unspecified: Secondary | ICD-10-CM

## 2022-11-24 DIAGNOSIS — Z0000 Encounter for general adult medical examination without abnormal findings: Secondary | ICD-10-CM

## 2022-11-24 DIAGNOSIS — I5042 Chronic combined systolic (congestive) and diastolic (congestive) heart failure: Secondary | ICD-10-CM

## 2022-11-24 DIAGNOSIS — E1151 Type 2 diabetes mellitus with diabetic peripheral angiopathy without gangrene: Secondary | ICD-10-CM

## 2022-11-24 DIAGNOSIS — Z992 Dependence on renal dialysis: Secondary | ICD-10-CM

## 2022-11-24 DIAGNOSIS — I700 Atherosclerosis of aorta: Secondary | ICD-10-CM

## 2022-11-24 DIAGNOSIS — I739 Peripheral vascular disease, unspecified: Secondary | ICD-10-CM

## 2022-11-24 DIAGNOSIS — E1122 Type 2 diabetes mellitus with diabetic chronic kidney disease: Secondary | ICD-10-CM

## 2022-11-24 DIAGNOSIS — I132 Hypertensive heart and chronic kidney disease with heart failure and with stage 5 chronic kidney disease, or end stage renal disease: Secondary | ICD-10-CM

## 2022-11-24 MED ORDER — GABAPENTIN 300 MG PO CAPS: 300.0000 mg | ORAL_CAPSULE | Freq: Every day | ORAL | 2 refills | Status: DC

## 2022-11-24 NOTE — Assessment & Plan Note (Signed)
Pressure well-controlled today.  At goal.

## 2022-11-24 NOTE — Progress Notes (Signed)
Subjective:   Charles Hart is a 67 y.o. male who presents for an Initial Medicare Annual Wellness Visit.  Review of Systems    Defer to PCP.        Objective:    Today's Vitals   11/24/22 1347  BP: 133/64  Pulse: 81  Temp: 97.8 F (36.6 C)  TempSrc: Oral  SpO2: 98%  Weight: 199 lb 1.6 oz (90.3 kg)  Height: 6' (1.829 m)   Body mass index is 27 kg/m.     11/24/2022    1:53 PM 11/24/2022   10:38 AM 05/26/2022    9:43 AM 05/22/2022    4:34 PM 11/29/2021    4:31 PM 08/19/2021   10:42 AM 07/20/2021   11:38 AM  Advanced Directives  Does Patient Have a Medical Advance Directive? No No No Yes No No No  Type of Advance Directive    Healthcare Power of Attorney     Does patient want to make changes to medical advance directive?      No - Patient declined No - Patient declined  Copy of Healthcare Power of Attorney in Chart?    No - copy requested     Would patient like information on creating a medical advance directive? No - Patient declined No - Patient declined No - Patient declined   No - Patient declined No - Patient declined    Current Medications (verified) Outpatient Encounter Medications as of 11/24/2022  Medication Sig   acetaminophen (TYLENOL) 325 MG tablet Take 2 tablets (650 mg total) by mouth every 6 (six) hours as needed for mild pain.   albuterol (VENTOLIN HFA) 108 (90 Base) MCG/ACT inhaler Inhale 2 puffs into the lungs every 6 (six) hours as needed for wheezing or shortness of breath.   calcium acetate (PHOSLO) 667 MG capsule Take 2,001 mg by mouth 3 (three) times daily with meals.    fluticasone (FLONASE) 50 MCG/ACT nasal spray Place 2 sprays into both nostrils as needed for allergies.   folic acid-vitamin b complex-vitamin c-selenium-zinc (DIALYVITE) 3 MG TABS tablet Take 1 tablet by mouth daily.   furosemide (LASIX) 80 MG tablet Take 1 tablet (80 mg total) by mouth daily.   gabapentin (NEURONTIN) 300 MG capsule Take 1 capsule (300 mg total) by mouth daily.    hydrALAZINE (APRESOLINE) 100 MG tablet Take 1 tablet (100 mg total) by mouth 3 (three) times daily.   OVER THE COUNTER MEDICATION Super beta prostate   pantoprazole (PROTONIX) 40 MG tablet TAKE 1 TABLET(40 MG) BY MOUTH DAILY 30 TO 60 MINUTES BEFORE FIRST MEAL OF THE DAY   pravastatin (PRAVACHOL) 40 MG tablet Take 1 tablet (40 mg total) by mouth every evening.   Tiotropium Bromide-Olodaterol (STIOLTO RESPIMAT) 2.5-2.5 MCG/ACT AERS 2 puffs each am   No facility-administered encounter medications on file as of 11/24/2022.    Allergies (verified) Ivp dye [iodinated contrast media], Hydrocodone, Phentermine, Amlodipine, Lisinopril, and Red dye   History: Past Medical History:  Diagnosis Date   Adenomatous colon polyp 07/02/2011   Last colonoscopy May 06, 2011 by Dr. Rob Bunting, who recommended repeat colonoscopy in 5 years.    Anemia    Background diabetic retinopathy 04/20/2012   Patient is followed by Dr. Dione Booze    Cancer    Kidney   Cardiomyopathy    LV function improved from 2004 to 2008.  Historically, moderately dilated LV with EF 30-40% by 2D echo 08/14/2002.  Mild CAD with severe LV dysfunction by cardiac cath  09/2002.  Normal coronary arteries and normal LV function by cardiac cath 09/19/2006.  A 2-D echo on 04/01/2009 showed mild concentric hypertrophy and normal systolic (LVEF  60-65%) and doppler C/W with grade 1 diastolic dysfunction.   CHF (congestive heart failure)    LV function improved from 2004 to 2008.  Historically, moderately dilated LV with EF 30-40% by 2D echo 08/14/2002.  Mild CAD with severe LV dysfunction by cardiac cath 09/2002.  Normal coronary arteries and normal LV function by cardiac cath 09/19/2006.  A 2-D echo on 04/01/2009 showed mild concentric hypertrophy and normal systolic (LVEF  60-65%) and doppler C/W with grade 1 diastolic dysfunction..    Chronic combined systolic and diastolic congestive heart failure 05/21/2010   LV function improved from 2004 to  2008.  Historically, moderately dilated LV with EF 30-40% by 2D echo 08/14/2002.  Mild CAD with severe LV dysfunction by cardiac cath 09/2002.  Normal coronary arteries and normal LV function by cardiac cath 09/19/2006.  A 2-D echo on 04/01/2009 showed mild concentric hypertrophy and normal systolic (LVEF  60-65%) and doppler parameters consistent with abnormal left    CVA (cerebral vascular accident) 07/04/2012   MRI of the brain 07/04/2012 showed an acute infarct in the right basal ganglia involving the anterior putamen, anterior limb internal capsule, and head of the caudate; this measured approximately 2.5 cm in diameter.      Dermatitis    Diabetes mellitus    type 2   DIABETIC PERIPHERAL NEUROPATHY 08/03/2007   Qualifier: Diagnosis of  By: Meredith Pel MD, Dorene Sorrow     DM neuropathy, painful    fingers and right knee   ESRD (end stage renal disease)    Stage 4, on hemodailysis x 4 months as of 05-18-18 [facility], tues thurs sat   ESRD (end stage renal disease) on dialysis    "TTS; [facility]" (05/31/2018)   Hearing loss in right ear    Hyperlipidemia    Hypertension    Hypertensive crisis 07/28/2012   Hypertensive urgency 08/20/2014   Myocardial infarction  many yrs ago   Nephrotic syndrome 02/18/2013   A 24-hour urine collection 03/04/2013 showed total protein of 5,460 g and creatinine clearance of 80 mL/minute.  Patient was seen by Loma Sender at [facility] and a repeat 24-hour urine showed 10,407 mg protein.  Patient underwent kidney biopsy on 05/30/2013; pathology showed advanced diffuse and nodular diabetic nephropathy with vascular changes consistent with long-standing difficult to control hypertension.      Pneumonia 2014 and 2015   Renal insufficiency    Sleep apnea    11/06/20 - hasn't used it "in a while"   Surgical pneumoperitoneum    Trochanteric bursitis of right hip 04/25/2018   Past Surgical History:  Procedure Laterality Date   AV FISTULA  PLACEMENT Left 11/24/2017   Procedure: INSERTION OF ARTERIOVENOUS (AV) GORE-TEX GRAFT LEFT LOWER ARM;  Surgeon: Larina Earthly, MD;  Location: [facility] OR;  Service: Vascular;  Laterality: Left;   CARDIAC CATHETERIZATION      4 times   COLONOSCOPY     COLONOSCOPY WITH PROPOFOL N/A 06/18/2020   Procedure: COLONOSCOPY WITH PROPOFOL;  Surgeon: Rachael Fee, MD;  Location: [facility] ENDOSCOPY;  Service: Endoscopy;  Laterality: N/A;   FOOT SURGERY     INCISIONAL HERNIA REPAIR N/A 11/09/2020   Procedure: OPEN INCISIONAL HERNIA REPAIR WITH MESH;  Surgeon: Axel Filler, MD;  Location: [facility] OR;  Service: General;  Laterality: N/A;   INSERTION OF  MESH N/A 11/09/2020   Procedure: INSERTION OF MESH;  Surgeon: Axel Filler, MD;  Location: [facility] OR;  Service: General;  Laterality: N/A;   POLYPECTOMY     POLYPECTOMY  06/18/2020   Procedure: POLYPECTOMY;  Surgeon: Rachael Fee, MD;  Location: [facility] ENDOSCOPY;  Service: Endoscopy;;   ROBOT ASSISTED LAPAROSCOPIC NEPHRECTOMY Left 05/30/2018   Procedure: XI ROBOTIC ASSISTED LAPAROSCOPIC NEPHRECTOMY;  Surgeon: Sebastian Ache, MD;  Location: [facility] ORS;  Service: Urology;  Laterality: Left;   Family History  Problem Relation Age of Onset   Aneurysm Father 90       died of rupture   Colon cancer Sister    Social History   Socioeconomic History   Marital status: Divorced    Spouse name: Not on file   Number of children: Not on file   Years of education: Not on file   Highest education level: Not on file  Occupational History   Not on file  Tobacco Use   Smoking status: Never   Smokeless tobacco: Never  Vaping Use   Vaping Use: Never used  Substance and Sexual Activity   Alcohol use: Not Currently   Drug use: No   Sexual activity: Not on file  Other Topics Concern   Not on file  Social History Narrative   Not on file   Social Determinants of Health   Financial Resource Strain: Medium Risk (11/24/2022)   Overall Financial Resource Strain (CARDIA)     Difficulty of Paying Living Expenses: Somewhat hard  Food Insecurity: No Food Insecurity (11/24/2022)   Hunger Vital Sign    Worried About Running Out of Food in the Last Year: Never true    Ran Out of Food in the Last Year: Never true  Transportation Needs: No Transportation Needs (11/24/2022)   PRAPARE - Administrator, Civil Service (Medical): No    Lack of Transportation (Non-Medical): No  Physical Activity: Inactive (11/24/2022)   Exercise Vital Sign    Days of Exercise per Week: 0 days    Minutes of Exercise per Session: 0 min  Stress: No Stress Concern Present (11/24/2022)   [employer] of Occupational Health - Occupational Stress Questionnaire    Feeling of Stress : Not at all  Social Connections: Moderately Isolated (11/24/2022)   Social Connection and Isolation Panel [NHANES]    Frequency of Communication with Friends and Family: More than three times a week    Frequency of Social Gatherings with Friends and Family: More than three times a week    Attends Religious Services: More than 4 times per year    Active Member of [employer] or Organizations: No    Attends Engineer, structural: Never    Marital Status: Divorced    Tobacco Counseling Counseling given: Not Answered   Clinical Intake:  Pre-visit preparation completed: Yes  Pain : No/denies pain     BMI - recorded: 27 Nutritional Status: BMI 25 -29 Overweight Nutritional Risks: None Diabetes: Yes CBG done?: Yes CBG resulted in Enter/ Edit results?: Yes Did pt. bring in CBG monitor from home?: No  How often do you need to have someone help you when you read instructions, pamphlets, or other written materials from your doctor or pharmacy?: 1 - Never What is the last grade level you completed in school?: SOME COLLEGE  Diabetic?yes   Interpreter Needed?: No  Information entered by :: Taffany Heiser, CMA 11/24/2022   Activities of Daily Living    11/24/2022  1:53 PM 11/24/2022   10:38 AM   In your present state of health, do you have any difficulty performing the following activities:  Hearing? 1 1  Vision? 0 0  Difficulty concentrating or making decisions? 0 0  Walking or climbing stairs? 0 0  Dressing or bathing? 0 0  Doing errands, shopping? 0 0  Preparing Food and eating ? N   Using the Toilet? N   In the past six months, have you accidently leaked urine? N   Do you have problems with loss of bowel control? N   Managing your Medications? N   Managing your Finances? N   Housekeeping or managing your Housekeeping? N     Patient Care Team: Gust RungHoffman, Erik C, DO as PCP - General (Internal Medicine) Wendall StadeNishan, Peter C, MD as PCP - Cardiology (Cardiology) Ernesto RutherfordGroat, Robert, MD (Ophthalmology) Wendall StadeNishan, Peter C, MD as Attending Physician (Cardiology) (Rounding), Imts Maurice March- Lane, MD as Attending Physician Gust RungHoffman, Erik C, DO as Consulting Physician (Internal Medicine)  Indicate any recent Medical Services you may have received from other than [facility] providers in the past year (date may be approximate).     Assessment:   This is a routine wellness examination for [place].  Hearing/Vision screen No results found.  Dietary issues and exercise activities discussed:     Goals Addressed   None   Depression Screen    11/24/2022    1:53 PM 11/24/2022    1:52 PM 11/24/2022   10:38 AM 05/26/2022    9:42 AM 08/19/2021   10:41 AM 06/21/2021    4:17 PM 05/06/2021   10:05 AM  PHQ 2/9 Scores  PHQ - 2 Score 0 0 0 0 0 0 0  PHQ- 9 Score      9     Fall Risk    11/24/2022    1:53 PM 11/24/2022   10:37 AM 05/26/2022    9:42 AM 08/19/2021   10:41 AM 06/21/2021    4:16 PM  Fall Risk   Falls in the past year? 0 0 1 0 0  Number falls in past yr: 0 0 0 0   Injury with Fall? 0 0 1 0   Risk for fall due to : No Fall Risks No Fall Risks Other (Comment) No Fall Risks No Fall Risks  Risk for fall due to: Comment   Slipped on his job.    Follow up Falls evaluation completed;Falls prevention  discussed Falls evaluation completed;Falls prevention discussed Falls evaluation completed;Falls prevention discussed Falls evaluation completed;Falls prevention discussed Falls evaluation completed    FALL RISK PREVENTION PERTAINING TO THE HOME:  Any stairs in or around the home? No  If so, are there any without handrails? No  Home free of loose throw rugs in walkways, pet beds, electrical cords, etc? No  Adequate lighting in your home to reduce risk of falls? No   ASSISTIVE DEVICES UTILIZED TO PREVENT FALLS:  Life alert? No  Use of a cane, walker or w/c? Yes  Grab bars in the bathroom? No  Shower chair or bench in shower? No  Elevated toilet seat or a handicapped toilet? No   TIMED UP AND GO:  Was the test performed? No .  Length of time to ambulate 10 feet: n/a sec.   Gait steady and fast without use of assistive device  Cognitive Function:        11/24/2022    1:53 PM  6CIT Screen  What Year? 0 points  What month?  0 points  What time? 0 points  Count back from 20 0 points  Months in reverse 0 points  Repeat phrase 0 points  Total Score 0 points    Immunizations Immunization History  Administered Date(s) Administered   Hepatitis B, ADULT 02/01/2018, 03/01/2018, 04/05/2018, 08/09/2018, 10/08/2019, 10/31/2019, 12/05/2019   Hepb-cpg 04/02/2020   Moderna Sars-Covid-2 Vaccination 08/28/2020, 09/28/2020    TDAP status: Due, Education has been provided regarding the importance of this vaccine. Advised may receive this vaccine at local pharmacy or Health Dept. Aware to provide a copy of the vaccination record if obtained from local pharmacy or Health Dept. Verbalized acceptance and understanding.  Flu Vaccine status: Up to date  Pneumococcal vaccine status: Due, Education has been provided regarding the importance of this vaccine. Advised may receive this vaccine at local pharmacy or Health Dept. Aware to provide a copy of the vaccination record if obtained from local  pharmacy or Health Dept. Verbalized acceptance and understanding.  Covid-19 vaccine status: Information provided on how to obtain vaccines.   Qualifies for Shingles Vaccine? Yes   Zostavax completed No   Shingrix Completed?: No.    Education has been provided regarding the importance of this vaccine. Patient has been advised to call insurance company to determine out of pocket expense if they have not yet received this vaccine. Advised may also receive vaccine at local pharmacy or Health Dept. Verbalized acceptance and understanding.  Screening Tests Health Maintenance  Topic Date Due   Pneumonia Vaccine 11+ Years old (1 of 2 - PCV) Never done   DTaP/Tdap/Td (1 - Tdap) Never done   Zoster Vaccines- Shingrix (1 of 2) Never done   OPHTHALMOLOGY EXAM  02/11/2020   COVID-19 Vaccine (3 - Moderna risk series) 10/26/2020   HEMOGLOBIN A1C  05/31/2022   INFLUENZA VACCINE  03/16/2023   FOOT EXAM  05/27/2023   LIPID PANEL  05/27/2023   Medicare Annual Wellness (AWV)  11/24/2023   COLONOSCOPY (Pts 45-77yrs Insurance coverage will need to be confirmed)  06/18/2025   Hepatitis C Screening  Completed   HIV Screening  Completed   HPV VACCINES  Aged Out    Health Maintenance  Health Maintenance Due  Topic Date Due   Pneumonia Vaccine 74+ Years old (1 of 2 - PCV) Never done   DTaP/Tdap/Td (1 - Tdap) Never done   Zoster Vaccines- Shingrix (1 of 2) Never done   OPHTHALMOLOGY EXAM  02/11/2020   COVID-19 Vaccine (3 - Moderna risk series) 10/26/2020   HEMOGLOBIN A1C  05/31/2022    Colorectal cancer screening: Type of screening: Colonoscopy. Completed 06/18/2020. Repeat every 5 years  Lung Cancer Screening: (Low Dose CT Chest recommended if Age 3-80 years, 30 pack-year currently smoking OR have quit w/in 15years.) does not qualify.   Lung Cancer Screening Referral: Defer to PCP.   Additional Screening:  Hepatitis C Screening: does qualify; Completed 08/01/2019  Vision Screening: Recommended  annual ophthalmology exams for early detection of glaucoma and other disorders of the eye. Is the patient up to date with their annual eye exam?  Yes  Who is the provider or what is the name of the office in which the patient attends annual eye exams? [facility]  If pt is not established with a provider, would they like to be referred to a provider to establish care? No .   Dental Screening: Recommended annual dental exams for proper oral hygiene  Community Resource Referral / Chronic Care Management: CRR required this visit?  No   CCM required this visit?  No      Plan:     I have personally reviewed and noted the following in the patient's chart:   Medical and social history Use of alcohol, tobacco or illicit drugs  Current medications and supplements including opioid prescriptions. Patient is not currently taking opioid prescriptions. Functional ability and status Nutritional status Physical activity Advanced directives List of other physicians Hospitalizations, surgeries, and ER visits in previous 12 months Vitals Screenings to include cognitive, depression, and falls Referrals and appointments  In addition, I have reviewed and discussed with patient certain preventive protocols, quality metrics, and best practice recommendations. A written personalized care plan for preventive services as well as general preventive health recommendations were provided to patient.     Edahi Kroening, CMA   11/24/2022   Nurse Notes: Face to Face.  Mr. Courtwright , Thank you for taking time to come for your Medicare Wellness Visit. I appreciate your ongoing commitment to your health goals. Please review the following plan we discussed and let me know if I can assist you in the future.   These are the goals we discussed:  Goals      Blood Pressure < 130/80     HEMOGLOBIN A1C < 7.0     LDL CALC < 70        This is a list of the screening recommended for you and due dates:  Health  Maintenance  Topic Date Due   Pneumonia Vaccine (1 of 2 - PCV) Never done   DTaP/Tdap/Td vaccine (1 - Tdap) Never done   Zoster (Shingles) Vaccine (1 of 2) Never done   Eye exam for diabetics  02/11/2020   COVID-19 Vaccine (3 - Moderna risk series) 10/26/2020   Hemoglobin A1C  05/31/2022   Flu Shot  03/16/2023   Complete foot exam   05/27/2023   Lipid (cholesterol) test  05/27/2023   Medicare Annual Wellness Visit  11/24/2023   Colon Cancer Screening  06/18/2025   Hepatitis C Screening: USPSTF Recommendation to screen - Ages 18-79 yo.  Completed   HIV Screening  Completed   HPV Vaccine  Aged Out

## 2022-11-24 NOTE — Patient Instructions (Signed)
Health Maintenance, Male Adopting a healthy lifestyle and getting preventive care are important in promoting health and wellness. Ask your health care provider about: The right schedule for you to have regular tests and exams. Things you can do on your own to prevent diseases and keep yourself healthy. What should I know about diet, weight, and exercise? Eat a healthy diet  Eat a diet that includes plenty of vegetables, fruits, low-fat dairy products, and lean protein. Do not eat a lot of foods that are high in solid fats, added sugars, or sodium. Maintain a healthy weight Body mass index (BMI) is a measurement that can be used to identify possible weight problems. It estimates body fat based on height and weight. Your health care provider can help determine your BMI and help you achieve or maintain a healthy weight. Get regular exercise Get regular exercise. This is one of the most important things you can do for your health. Most adults should: Exercise for at least 150 minutes each week. The exercise should increase your heart rate and make you sweat (moderate-intensity exercise). Do strengthening exercises at least twice a week. This is in addition to the moderate-intensity exercise. Spend less time sitting. Even light physical activity can be beneficial. Watch cholesterol and blood lipids Have your blood tested for lipids and cholesterol at 66 years of age, then have this test every 5 years. You may need to have your cholesterol levels checked more often if: Your lipid or cholesterol levels are high. You are older than 66 years of age. You are at high risk for heart disease. What should I know about cancer screening? Many types of cancers can be detected early and may often be prevented. Depending on your health history and family history, you may need to have cancer screening at various ages. This may include screening for: Colorectal cancer. Prostate cancer. Skin cancer. Lung  cancer. What should I know about heart disease, diabetes, and high blood pressure? Blood pressure and heart disease High blood pressure causes heart disease and increases the risk of stroke. This is more likely to develop in people who have high blood pressure readings or are overweight. Talk with your health care provider about your target blood pressure readings. Have your blood pressure checked: Every 3-5 years if you are 18-39 years of age. Every year if you are 40 years old or older. If you are between the ages of 65 and 75 and are a current or former smoker, ask your health care provider if you should have a one-time screening for abdominal aortic aneurysm (AAA). Diabetes Have regular diabetes screenings. This checks your fasting blood sugar level. Have the screening done: Once every three years after age 45 if you are at a normal weight and have a low risk for diabetes. More often and at a younger age if you are overweight or have a high risk for diabetes. What should I know about preventing infection? Hepatitis B If you have a higher risk for hepatitis B, you should be screened for this virus. Talk with your health care provider to find out if you are at risk for hepatitis B infection. Hepatitis C Blood testing is recommended for: Everyone born from 1945 through 1965. Anyone with known risk factors for hepatitis C. Sexually transmitted infections (STIs) You should be screened each year for STIs, including gonorrhea and chlamydia, if: You are sexually active and are younger than 66 years of age. You are older than 66 years of age and your   health care provider tells you that you are at risk for this type of infection. Your sexual activity has changed since you were last screened, and you are at increased risk for chlamydia or gonorrhea. Ask your health care provider if you are at risk. Ask your health care provider about whether you are at high risk for HIV. Your health care provider  may recommend a prescription medicine to help prevent HIV infection. If you choose to take medicine to prevent HIV, you should first get tested for HIV. You should then be tested every 3 months for as long as you are taking the medicine. Follow these instructions at home: Alcohol use Do not drink alcohol if your health care provider tells you not to drink. If you drink alcohol: Limit how much you have to 0-2 drinks a day. Know how much alcohol is in your drink. In the [place] one drink equals one 12 oz bottle of beer (355 mL), one 5 oz glass of wine (148 mL), or one 1 oz glass of hard liquor (44 mL). Lifestyle Do not use any products that contain nicotine or tobacco. These products include cigarettes, chewing tobacco, and vaping devices, such as e-cigarettes. If you need help quitting, ask your health care provider. Do not use street drugs. Do not share needles. Ask your health care provider for help if you need support or information about quitting drugs. General instructions Schedule regular health, dental, and eye exams. Stay current with your vaccines. Tell your health care provider if: You often feel depressed. You have ever been abused or do not feel safe at home. Summary Adopting a healthy lifestyle and getting preventive care are important in promoting health and wellness. Follow your health care provider's instructions about healthy diet, exercising, and getting tested or screened for diseases. Follow your health care provider's instructions on monitoring your cholesterol and blood pressure. This information is not intended to replace advice given to you by your health care provider. Make sure you discuss any questions you have with your health care provider. Document Revised: 12/21/2020 Document Reviewed: 12/21/2020 Elsevier Patient Education  2023 [employer]vier Inc.  

## 2022-11-24 NOTE — Assessment & Plan Note (Signed)
Will be due for follow-up ABIs in about 6 months/ at time of follow up

## 2022-11-24 NOTE — Assessment & Plan Note (Signed)
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

## 2022-11-24 NOTE — Assessment & Plan Note (Signed)
Refill gabapentin 300 mg daily dose.  He is otherwise diet controlled for his diabetes.

## 2022-11-24 NOTE — Assessment & Plan Note (Signed)
Appears euvolemic today I discussed with him I do not have much more information for him about whether he would need a dual heart and kidney transplant.

## 2022-11-24 NOTE — Assessment & Plan Note (Signed)
Mild bilateral carotid artery disease noted in November 2021 plan was for follow-up 2 years will place orders for repeat carotid artery studies.

## 2022-11-24 NOTE — Assessment & Plan Note (Signed)
His cough is currently resolved although he has been on gabapentin 300 mg twice a day I discussed with him decreasing back down to 300 mg daily if his call for to return I would like him to follow-up with Dr. Sherene Sires again.

## 2022-11-24 NOTE — Assessment & Plan Note (Signed)
Volume status appears to be well-controlled he discussed he will be late to his hemodialysis appointment today as he is Tuesday Thursday Saturday.

## 2022-11-24 NOTE — Assessment & Plan Note (Signed)
Aortic atherosclerosis present patient is on pravastatin for secondary prevention.

## 2022-11-24 NOTE — Progress Notes (Signed)
Established Patient Office Visit  Subjective   Patient ID: Charles Hart, male    DOB: 05-Jul-1956  Age: 67 y.o. MRN: [id number]  Chief Complaint  Patient presents with   Follow-up   Charles Hart presents today for follow-up of his diabetes hypertension ESRD as well as an annual wellness visit.  We have discussed his healthcare maintenance goals and of the requirements as documented elsewhere.  His overall main concern today is frustration with the healthcare system.  He is frustrated as he feels he should be able to get a kidney transplant but has been declined at [facility] was told at [facility] he would need a heart and kidney transplant and told by insurance that [facility] he has been able to navigate this successfully and is scheduled for the start of [facility].  He does have a history of dilated cardiomyopathy for which she follows with Dr. Eden Emms.  He has ESRD and is on hemodialysis at [facility] in [place].  For his diabetes it is actually been quite well-controlled in recent years his A1c's have been mainly checked at dialysis however we do not always have the records we have requested that his last A1c record which was reported over the phone to be 4.6% he is on diet control.  He also asked about gabapentin was originally given this to him for diabetic neuropathy however he was then following with Dr. Sherene Sires for a chronic cough and his gabapentin was increased to 300mg  BID.  This is over the dose I usually prescribe and ESRD however his cough has been well-controlled although he thinks this may have been more to them "messing around with my dry weight."  Since Dr. Elesa Massed told him to follow-up as needed he does not want to follow-up with him for a refill of his gabapentin.     Objective:     BP 133/64 (BP Location: Right Arm, Patient Position: Sitting, Cuff Size: Normal)   Pulse 81   Temp 97.8 F (36.6 C) (Oral)   Ht 6' (1.829 m)   Wt 199  lb 1.6 oz (90.3 kg)   SpO2 98% Comment: ra  BMI 27.00 kg/m  BP Readings from Last 3 Encounters:  11/24/22 133/64  11/24/22 133/64  06/10/22 138/82   Wt Readings from Last 3 Encounters:  11/24/22 199 lb 1.6 oz (90.3 kg)  11/24/22 199 lb 1.6 oz (90.3 kg)  06/10/22 190 lb 3.2 oz (86.3 kg)      Physical Exam Vitals and nursing note reviewed.  Constitutional:      Appearance: Normal appearance.  Cardiovascular:     Rate and Rhythm: Normal rate and regular rhythm.  Pulmonary:     Effort: Pulmonary effort is normal.     Breath sounds: Normal breath sounds. No wheezing or rhonchi.  Musculoskeletal:     Right lower leg: No edema.     Left lower leg: No edema.  Neurological:     Mental Status: He is alert.      No results found for any visits on 11/24/22.  Last CBC Lab Results  Component Value Date   WBC 4.2 05/22/2022   HGB 11.9 (L) 05/22/2022   HCT 37.3 (L) 05/22/2022   MCV 94.4 05/22/2022   MCH 30.1 05/22/2022   RDW 14.2 05/22/2022   PLT 123 (L) 05/22/2022   Last metabolic panel Lab Results  Component Value Date   GLUCOSE 94 05/22/2022   NA 135 05/22/2022  K 4.8 05/22/2022   CL 100 05/22/2022   CO2 24 05/22/2022   BUN 39 (H) 05/22/2022   CREATININE 6.93 (H) 05/22/2022   GFRNONAA 8 (L) 05/22/2022   CALCIUM 9.0 05/22/2022   PHOS 3.1 05/20/2021   PROT 8.0 11/29/2021   ALBUMIN 4.0 11/29/2021   LABGLOB 3.0 11/18/2014   AGRATIO 0.8 11/18/2014   BILITOT 1.2 11/29/2021   ALKPHOS 78 11/29/2021   AST 19 11/29/2021   ALT 18 11/29/2021   ANIONGAP 11 05/22/2022   Last lipids Lab Results  Component Value Date   CHOL 129 05/26/2022   HDL 51 05/26/2022   LDLCALC 66 05/26/2022   LDLDIRECT 80 07/18/2012   TRIG 53 05/26/2022   CHOLHDL 2.5 05/26/2022   Last hemoglobin A1c Lab Results  Component Value Date   HGBA1C 4.5 02/28/2022      The ASCVD Risk score (Arnett DK, et al., 2019) failed to calculate for the following reasons:   The patient has a prior MI  or stroke diagnosis    Assessment & Plan:   Problem List Items Addressed This Visit       Cardiovascular and Mediastinum   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      Essential (primary) hypertension    Pressure well-controlled today.  At goal.      Mild Bilateral Lower Extremity Peripheral arterial disease (HCC) (Chronic)    Will be due for follow-up ABIs in about 6 months/ at time of follow up      Bilateral carotid artery disease    Mild bilateral carotid artery disease noted in November 2021 plan was for follow-up 2 years will place orders for repeat carotid artery studies.      Relevant Orders   US Carotid Duplex Bilateral   Aortic atherosclerosis    Aortic atherosclerosis present patient is on pravastatin for secondary prevention.        Endocrine   Type 2 diabetes mellitus with diabetic neuropathy - Primary    Refill gabapentin 300 mg daily dose.  He is otherwise diet controlled for his diabetes.      Secondary hyperparathyroidism, renal (Chronic)    Secondary renal hyperparathyroidism managed by nephrology with [facility].        Genitourinary   ESRD on hemodialysis    Volume status appears to be well-controlled he discussed he will be late to his hemodialysis appointment today as he is Tuesday Thursday Saturday.        Other   Upper airway cough syndrome (Chronic)    His cough is currently resolved although he has been on gabapentin 300 mg twice a day I discussed with him decreasing back down to 300 mg daily if his call for to return I would like him to follow-up with Dr. Sherene Sires again.       Return in about 6 months (around 05/26/2023).    Gust Rung, DO

## 2022-12-19 ENCOUNTER — Other Ambulatory Visit: Payer: Self-pay

## 2022-12-19 ENCOUNTER — Encounter (HOSPITAL_COMMUNITY): Payer: Self-pay | Admitting: Ophthalmology

## 2022-12-19 NOTE — Anesthesia Preprocedure Evaluation (Signed)
Anesthesia Evaluation  Patient identified by MRN, date of birth, ID band Patient awake    Reviewed: Allergy & Precautions, NPO status , Patient's Chart, lab work & pertinent test results  History of Anesthesia Complications Negative for: history of anesthetic complications  Airway Mallampati: I  TM Distance: >3 FB Neck ROM: Full    Dental  (+) Edentulous Upper, Edentulous Lower   Pulmonary sleep apnea and Continuous Positive Airway Pressure Ventilation , COPD,  COPD inhaler   breath sounds clear to auscultation       Cardiovascular hypertension, Pt. on medications pulmonary hypertension(-) angina + Peripheral Vascular Disease, +CHF and + DOE   Rhythm:Regular Rate:Normal  08/2022 ECHO:  1. The LV is severely dilated in size with upper normal wall thickness.   2. The LVF is severely decreased, LVEF is visually estimated at 20%.    3. There is grade II diastolic dysfunction (elevated filling pressure).    4. Mitral annular calcification is present (mild).    5. There is moderate MR.    6. The left atrium is mildly to moderately dilated in size.    7. The aortic valve is trileaflet with mildly thickened leaflets with normal excursion.   8. The aorta at the sinuses of Valsalva is mildly dilated.    9. The RV is moderately dilated in size, with moderately reduced systolic function.    10. There is moderate pulmonary hypertension.     Neuro/Psych CVA, No Residual Symptoms    GI/Hepatic Neg liver ROS,GERD  Medicated and Controlled,,  Endo/Other  diabetes (glu 84)    Renal/GU Dialysis and ESRFRenal disease (K+ 5.1)     Musculoskeletal   Abdominal   Peds  Hematology negative hematology ROS (+)   Anesthesia Other Findings   Reproductive/Obstetrics                             Anesthesia Physical Anesthesia Plan  ASA: 4  Anesthesia Plan: MAC   Post-op Pain Management: Tylenol PO (pre-op)*    Induction:   PONV Risk Score and Plan: 2 and Ondansetron and Dexamethasone  Airway Management Planned: Natural Airway and Nasal Cannula  Additional Equipment: None  Intra-op Plan:   Post-operative Plan:   Informed Consent: I have reviewed the patients History and Physical, chart, labs and discussed the procedure including the risks, benefits and alternatives for the proposed anesthesia with the patient or authorized representative who has indicated his/her understanding and acceptance.       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  He is being evaluated at [facility] for heart transplant as well as [facility] for kidney transplant.  Cardiopulmonary stress test 01/06/2021 showed peak VO2 19 mL/kg/min (72.8% of predicted normal).  Echo 09/05/2022 showed EF 20%, grade 2 DD, moderate mitral regurgitation, moderate pulmonary hypertension.  Last seen by his cardiologist at [facility] on 12/12/2022, stable at that time, no changes to medication management.  Patient dialyzes on Tuesday Thursday Saturday schedule states he plans to dialyze 12/20/2022 prior to coming for surgery.  History of upper airway cough syndrome, maintained on Stiolto Respimat.  He will need day of surgery labs and evaluation.  EKG 10/10/2022 (Care Everywhere, tracing requested): NSR.  Rate 84.  Possible LAE.  LVH with QRS widening.  Inferolateral  T wave abnormality.  6 Minute Walk 10/10/2022 (Care Everywhere):  walked 420 meters = 1,377.6 feet. Pre-test vitals with 89 heart rate and 94% spO2. Post-test vitals with 121 heart rate and 91% spO2  TTE 09/05/2022 (Care Everywhere): 1. The left ventricle is severely dilated in size with upper normal wall thickness. 2. The left ventricular systolic function is severely decreased, LVEF is  visually estimated at 20%. 3. There is grade II diastolic dysfunction (elevated filling pressure). 4. Mitral annular calcification is present (mild). 5. There is moderate mitral valve regurgitation. 6. The left atrium is mildly to moderately dilated in size. 7. The aortic valve is trileaflet with mildly thickened leaflets with normal excursion. 8. The aorta at the sinuses of Valsalva is mildly dilated. 9. The right ventricle is moderately dilated in size, with moderately reduced systolic function. 10. There is moderate pulmonary hypertension. 11. TR maximum velocity: 3.0 m/s Estimated PASP: 50 mmHg. 12. The right atrium is mildly to moderately dilated in size. 13. IVC size and inspiratory change suggest elevated right atrial pressure. (10-20 mmHg). TTE in Oct 2022 [facility]): LVEF 30-35%, LVIDd TTE in Dec 2021 [facility]): LVEF 35%, LVIDd 6.7, normal RV  Cardiopulmonary Stress Test 01/06/2021 (Care Everywhere): CPX in May 2022 ([facility]): pVO2 19 (72% predicted), RER 1.24 1) The data suggests a maximal exercise test, so maximal cardiorespiratory capacity could be determined. 2) Based on the peak oxygen consumption (VO2) achieved, functional capacity would be consistent with a mild functional impairment, with peak VO2 19.0 ml/kg/min (72.8% of predicted normals). Normative peak predicted VO2 values are corrected for age, gender, and weight. The Oxygen Uptake Efficiency Slope was 2687 mL/min. 3) There were maximal signs of a pulmonary limitation to exercise. ECG notable for baseline NSR w/ IVCD. Multifocal PVCs and ventricular couplets noted. No ST segment changes noted. Normal HR recovery. 4) Aerobic reserve was low-normal suggesting physical deconditioning may be a potential limitation to exercise capacity. 5) Ventilatory reserve limits were reached at peak exercise with apparent pulmonary limitation to exercise. Resting spirometry suggests a restrictive pulmonary. The MVV is  normal. 6) The O2 pulse (an indicator of stroke volume) increased throughout exercise.  Nuclear stress test 08/13/2020: Myocardial perfusion is abnormal. Midl to moderate decreased tracer activity in the inferior/inferolateral wall (base, mid, distal) consistent with scar and soft tissue attenuation (diaphragm) No significant ischemia This is an intermediate risk study. Overall left ventricular systolic function was abnormal. Nuclear stress EF: 39%.    )        Anesthesia Quick Evaluation

## 2022-12-19 NOTE — Progress Notes (Signed)
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  He is being evaluated at [facility] for heart transplant as well as [facility] for kidney transplant.  Cardiopulmonary stress test 01/06/2021 showed peak VO2 19 mL/kg/min (72.8% of predicted normal).  Echo 09/05/2022 showed EF 20%, grade 2 DD, moderate mitral regurgitation, moderate pulmonary hypertension.  Last seen by his cardiologist at [facility] on 12/12/2022, stable at that time, no changes to medication management.  Patient dialyzes on Tuesday Thursday Saturday schedule states he plans to dialyze 12/20/2022 prior to coming for surgery.  History of upper airway cough syndrome, maintained on Stiolto Respimat.  He will need day of surgery labs and evaluation.  EKG 10/10/2022 (Care Everywhere, tracing requested): NSR.  Rate 84.  Possible LAE.  LVH with QRS widening.  Inferolateral T wave abnormality.  6 Minute Walk 10/10/2022 (Care Everywhere):  walked 420 meters = 1,377.6 feet. Pre-test vitals with 89 heart rate and 94% spO2. Post-test vitals with 121 heart rate and 91% spO2  TTE 09/05/2022 (Care Everywhere): 1. The left ventricle is severely dilated in size with upper normal wall thickness. 2. The left ventricular systolic function is severely decreased, LVEF is visually estimated at 20%. 3. There is grade II diastolic dysfunction (elevated filling pressure). 4. Mitral annular calcification is present (mild). 5. There is moderate mitral valve regurgitation. 6. The left atrium is mildly to moderately dilated in size. 7. The aortic valve is trileaflet with mildly thickened leaflets with normal excursion. 8. The aorta at the sinuses of Valsalva is mildly dilated. 9. The right ventricle is moderately dilated in size, with moderately reduced systolic function. 10. There is moderate pulmonary hypertension. 11. TR maximum  velocity: 3.0 m/s Estimated PASP: 50 mmHg. 12. The right atrium is mildly to moderately dilated in size. 13. IVC size and inspiratory change suggest elevated right atrial pressure. (10-20 mmHg). TTE in Oct 2022 [facility]): LVEF 30-35%, LVIDd TTE in Dec 2021 [facility]): LVEF 35%, LVIDd 6.7, normal RV  Cardiopulmonary Stress Test 01/06/2021 (Care Everywhere): CPX in May 2022 ([facility]): pVO2 19 (72% predicted), RER 1.24 1) The data suggests a maximal exercise test, so maximal cardiorespiratory capacity could be determined. 2) Based on the peak oxygen consumption (VO2) achieved, functional capacity would be consistent with a mild functional impairment, with peak VO2 19.0 ml/kg/min (72.8% of predicted normals). Normative peak predicted VO2 values are corrected for age, gender, and weight. The Oxygen Uptake Efficiency Slope was 2687 mL/min. 3) There were maximal signs of a pulmonary limitation to exercise. ECG notable for baseline NSR w/ IVCD. Multifocal PVCs and ventricular couplets noted. No ST segment changes noted. Normal HR recovery. 4) Aerobic reserve was low-normal suggesting physical deconditioning may be a potential limitation to exercise capacity. 5) Ventilatory reserve limits were reached at peak exercise with apparent pulmonary limitation to exercise. Resting spirometry suggests a restrictive pulmonary. The MVV is normal. 6) The O2 pulse (an indicator of stroke volume) increased throughout exercise.  Nuclear stress test 08/13/2020: Myocardial perfusion is abnormal. Midl to moderate decreased tracer activity in the inferior/inferolateral wall (base, mid, distal) consistent with scar and soft tissue attenuation (diaphragm) No significant ischemia This is an intermediate risk study. Overall left ventricular systolic function was abnormal. Nuclear stress EF: 39%.      Zannie [facility] Short Stay Center/Anesthesiology Phone [phone] 12/19/2022 3:37 PM

## 2022-12-19 NOTE — Pre-Procedure Instructions (Addendum)
SDW CALL  Patient was given pre-op instructions over the phone. The opportunity was given for the patient to ask questions. No further questions asked. Patient verbalized understanding of instructions given.   PCP -    Gust Rung, DO   Cardiologist - Dr. Charlton Haws  PPM/ICD - denies    Chest x-ray - N/A EKG - 10/10/22- requested tracing- DOS if not received Stress Test - 12/17/20 ECHO - 09/05/22 Cardiac Cath - pt reports having cardiac caths in the past  Sleep Study - no CPAP   Fasting Blood Sugar - denies DM- pt states he was Diabetic 9 years ago but lost weight. Last Hgb A1c 4.5 on 02/28/22. Patient does not take anything for DM.    Blood Thinner Instructions: N/A Aspirin Instructions:N/A  ERAS Protcol - ERAS per protocol   COVID TEST- denies  Pt states he goes to HD on tue/thur/sat and is scheduled to go to HD in the AM tomorrow. Anesthesia APP notified.   Pt states he does not normally take his hydralazine on HD days before going to HD.   Anesthesia review: yes- heart history- pt being worked up for heart and kidney transplant at [facility]. Follow up on requested EKG, will need DOS if not received. Pt reports that overall he feels well, no cardiac symptoms. Pt states he feels like he is at his baseline.   Patient denies shortness of breath, fever, cough and chest pain over the phone call    Surgical Instructions    Your procedure is scheduled on Tuesday, May 7  Report to [facility] Main Entrance "A" at 1:30PM, then check in with the Admitting office.  Call this number if you have problems the morning of surgery:  [phone]    Remember:  Do not eat after midnight the night before your surgery  You may drink clear liquids until 1:00PM the day of your surgery.   Clear liquids allowed are: Water, Non-Citrus Juices (without pulp), Carbonated Beverages, Clear Tea, Black Coffee ONLY (NO MILK, CREAM OR POWDERED CREAMER of any kind), and Gatorade   Take these  medicines the morning of surgery with A SIP OF WATER:   flonase if needed, gabapentin, hydralazine, protonix, STIOLTO, acetaminophen (TYLENOL) if needed, albuterol-bring inhaler,   As of today, STOP taking any Aspirin (unless otherwise instructed by your surgeon) Aleve, Naproxen, Ibuprofen, Motrin, Advil, Goody's, BC's, all herbal medications, fish oil, and all vitamins.  [facility] is not responsible for any belongings or valuables.    Contacts, glasses, hearing aids, dentures or partials may not be worn into surgery, please bring cases for these belongings   Patients discharged the day of surgery will not be allowed to drive home, and someone needs to stay with them for 24 hours.   SURGICAL WAITING ROOM VISITATION You may have 1 visitor in the pre-op area at a time determined by the pre-op nurse. (Visitor may not switch out) Patients having surgery or a procedure in a hospital may have two support people in the waiting room.    Day of Surgery:  Take a shower the day of or night before with antibacterial soap. Wear Clean/Comfortable clothing the morning of surgery Do not apply any deodorants/lotions.   Do not wear jewelry or makeup Do not wear lotions, powders, perfumes/colognes, or deodorant. Do not shave 48 hours prior to surgery.  Men may shave face and neck. Do not bring valuables to the hospital. Do not wear nail polish, gel polish, artificial nails, or any  other type of covering on natural nails (fingers and toes)  Remember to brush your teeth WITH YOUR REGULAR TOOTHPASTE.

## 2022-12-20 ENCOUNTER — Ambulatory Visit (HOSPITAL_COMMUNITY): Payer: 59 | Admitting: Physician Assistant

## 2022-12-20 ENCOUNTER — Encounter (HOSPITAL_COMMUNITY): Payer: Self-pay | Admitting: Ophthalmology

## 2022-12-20 ENCOUNTER — Ambulatory Visit (HOSPITAL_BASED_OUTPATIENT_CLINIC_OR_DEPARTMENT_OTHER): Payer: 59 | Admitting: Physician Assistant

## 2022-12-20 ENCOUNTER — Ambulatory Visit (HOSPITAL_COMMUNITY)
Admission: RE | Admit: 2022-12-20 | Discharge: 2022-12-20 | Disposition: A | Discharge status: Home / Self Care | Payer: 59 | Attending: Ophthalmology | Admitting: Ophthalmology

## 2022-12-20 ENCOUNTER — Encounter (HOSPITAL_COMMUNITY)
Admission: RE | Disposition: A | Discharge status: Home / Self Care | Payer: Self-pay | Source: Home / Self Care | Attending: Ophthalmology

## 2022-12-20 DIAGNOSIS — Z992 Dependence on renal dialysis: Secondary | ICD-10-CM | POA: Insufficient documentation

## 2022-12-20 DIAGNOSIS — E1122 Type 2 diabetes mellitus with diabetic chronic kidney disease: Secondary | ICD-10-CM | POA: Diagnosis not present

## 2022-12-20 DIAGNOSIS — Z9989 Dependence on other enabling machines and devices: Secondary | ICD-10-CM

## 2022-12-20 DIAGNOSIS — N186 End stage renal disease: Secondary | ICD-10-CM | POA: Insufficient documentation

## 2022-12-20 DIAGNOSIS — I132 Hypertensive heart and chronic kidney disease with heart failure and with stage 5 chronic kidney disease, or end stage renal disease: Secondary | ICD-10-CM | POA: Insufficient documentation

## 2022-12-20 DIAGNOSIS — G4733 Obstructive sleep apnea (adult) (pediatric): Secondary | ICD-10-CM | POA: Diagnosis not present

## 2022-12-20 DIAGNOSIS — I272 Pulmonary hypertension, unspecified: Secondary | ICD-10-CM | POA: Insufficient documentation

## 2022-12-20 DIAGNOSIS — E113522 Type 2 diabetes mellitus with proliferative diabetic retinopathy with traction retinal detachment involving the macula, left eye: Secondary | ICD-10-CM | POA: Insufficient documentation

## 2022-12-20 DIAGNOSIS — E1142 Type 2 diabetes mellitus with diabetic polyneuropathy: Secondary | ICD-10-CM | POA: Diagnosis not present

## 2022-12-20 DIAGNOSIS — Z905 Acquired absence of kidney: Secondary | ICD-10-CM | POA: Insufficient documentation

## 2022-12-20 DIAGNOSIS — I5042 Chronic combined systolic (congestive) and diastolic (congestive) heart failure: Secondary | ICD-10-CM | POA: Diagnosis not present

## 2022-12-20 DIAGNOSIS — I340 Nonrheumatic mitral (valve) insufficiency: Secondary | ICD-10-CM | POA: Diagnosis not present

## 2022-12-20 DIAGNOSIS — J449 Chronic obstructive pulmonary disease, unspecified: Secondary | ICD-10-CM

## 2022-12-20 DIAGNOSIS — K219 Gastro-esophageal reflux disease without esophagitis: Secondary | ICD-10-CM | POA: Insufficient documentation

## 2022-12-20 DIAGNOSIS — E1151 Type 2 diabetes mellitus with diabetic peripheral angiopathy without gangrene: Secondary | ICD-10-CM | POA: Insufficient documentation

## 2022-12-20 DIAGNOSIS — Z8673 Personal history of transient ischemic attack (TIA), and cerebral infarction without residual deficits: Secondary | ICD-10-CM | POA: Diagnosis not present

## 2022-12-20 DIAGNOSIS — Z79899 Other long term (current) drug therapy: Secondary | ICD-10-CM | POA: Insufficient documentation

## 2022-12-20 DIAGNOSIS — E113532 Type 2 diabetes mellitus with proliferative diabetic retinopathy with traction retinal detachment not involving the macula, left eye: Secondary | ICD-10-CM

## 2022-12-20 HISTORY — PX: PARS PLANA VITRECTOMY: SHX2166

## 2022-12-20 HISTORY — PX: PHOTOCOAGULATION WITH LASER: SHX6027

## 2022-12-20 HISTORY — PX: INJECTION OF SILICONE OIL: SHX6422

## 2022-12-20 HISTORY — PX: REPAIR OF COMPLEX TRACTION RETINAL DETACHMENT: SHX6217

## 2022-12-20 LAB — POCT I-STAT, CHEM 8
BUN: 26 mg/dL — ABNORMAL HIGH (ref 8–23)
Calcium, Ion: 1.02 mmol/L — ABNORMAL LOW (ref 1.15–1.40)
Chloride: 101 mmol/L (ref 98–111)
Creatinine, Ser: 6 mg/dL — ABNORMAL HIGH (ref 0.61–1.24)
Glucose, Bld: 84 mg/dL (ref 70–99)
HCT: 43 % (ref 39.0–52.0)
Hemoglobin: 14.6 g/dL (ref 13.0–17.0)
Potassium: 5.1 mmol/L (ref 3.5–5.1)
Sodium: 135 mmol/L (ref 135–145)
TCO2: 28 mmol/L (ref 22–32)

## 2022-12-20 LAB — GLUCOSE, CAPILLARY: Glucose-Capillary: 75 mg/dL (ref 70–99)

## 2022-12-20 SURGERY — REPAIR, RETINAL DETACHMENT, COMPLEX
Anesthesia: Monitor Anesthesia Care | Site: Eye | Laterality: Left

## 2022-12-20 MED ORDER — EPINEPHRINE PF 1 MG/ML IJ SOLN
INTRAMUSCULAR | Status: AC
  Filled 2022-12-20: qty 1

## 2022-12-20 MED ORDER — MIDAZOLAM HCL 2 MG/2ML IJ SOLN
INTRAMUSCULAR | Status: DC | PRN
  Administered 2022-12-20: .5 mg via INTRAVENOUS
  Administered 2022-12-20: 1 mg via INTRAVENOUS
  Administered 2022-12-20: .5 mg via INTRAVENOUS

## 2022-12-20 MED ORDER — TROPICAMIDE 1 % OP SOLN
1.0000 [drp] | OPHTHALMIC | Status: AC | PRN
  Administered 2022-12-20 (×3): 1 [drp] via OPHTHALMIC
  Filled 2022-12-20: qty 15

## 2022-12-20 MED ORDER — TOBRAMYCIN-DEXAMETHASONE 0.3-0.1 % OP OINT
TOPICAL_OINTMENT | OPHTHALMIC | Status: DC | PRN
  Administered 2022-12-20: 1 via OPHTHALMIC

## 2022-12-20 MED ORDER — CHLORHEXIDINE GLUCONATE 0.12 % MT SOLN
15.0000 mL | Freq: Once | OROMUCOSAL | Status: AC
  Administered 2022-12-20: 15 mL via OROMUCOSAL
  Filled 2022-12-20: qty 15

## 2022-12-20 MED ORDER — TRIAMCINOLONE ACETONIDE 40 MG/ML IJ SUSP: INTRAMUSCULAR | Status: DC | PRN

## 2022-12-20 MED ORDER — PROPARACAINE HCL 0.5 % OP SOLN
1.0000 [drp] | OPHTHALMIC | Status: AC | PRN
  Administered 2022-12-20 (×3): 1 [drp] via OPHTHALMIC
  Filled 2022-12-20: qty 15

## 2022-12-20 MED ORDER — LIDOCAINE HCL 2 % IJ SOLN
INTRAMUSCULAR | Status: AC
  Filled 2022-12-20: qty 20

## 2022-12-20 MED ORDER — ATROPINE SULFATE 1 % OP SOLN
OPHTHALMIC | Status: AC
  Filled 2022-12-20: qty 5

## 2022-12-20 MED ORDER — ACETAMINOPHEN 500 MG PO TABS
1000.0000 mg | ORAL_TABLET | Freq: Once | ORAL | Status: AC
  Administered 2022-12-20: 1000 mg via ORAL
  Filled 2022-12-20: qty 2

## 2022-12-20 MED ORDER — OFLOXACIN 0.3 % OP SOLN
1.0000 [drp] | OPHTHALMIC | Status: AC | PRN
  Administered 2022-12-20 (×3): 1 [drp] via OPHTHALMIC
  Filled 2022-12-20: qty 5

## 2022-12-20 MED ORDER — LIDOCAINE HCL 2 % IJ SOLN
INTRAMUSCULAR | Status: DC | PRN
  Administered 2022-12-20: 6 mL via RETROBULBAR

## 2022-12-20 MED ORDER — PROPOFOL 10 MG/ML IV BOLUS
INTRAVENOUS | Status: DC | PRN
  Administered 2022-12-20: 50 mg via INTRAVENOUS

## 2022-12-20 MED ORDER — TOBRAMYCIN-DEXAMETHASONE 0.3-0.1 % OP OINT
TOPICAL_OINTMENT | OPHTHALMIC | Status: AC
  Filled 2022-12-20: qty 3.5

## 2022-12-20 MED ORDER — INDOCYANINE GREEN 25 MG IV SOLR
INTRAVENOUS | Status: AC
  Filled 2022-12-20: qty 10

## 2022-12-20 MED ORDER — PHENYLEPHRINE 80 MCG/ML (10ML) SYRINGE FOR IV PUSH (FOR BLOOD PRESSURE SUPPORT)
PREFILLED_SYRINGE | INTRAVENOUS | Status: DC | PRN
  Administered 2022-12-20: 80 ug via INTRAVENOUS

## 2022-12-20 MED ORDER — LACTATED RINGERS IV SOLN: INTRAVENOUS | Status: DC

## 2022-12-20 MED ORDER — CEFAZOLIN SUBCONJUNCTIVAL INJECTION 100 MG/0.5 ML
200.0000 mg | INJECTION | SUBCONJUNCTIVAL | Status: AC
  Administered 2022-12-20: 100 mg via SUBCONJUNCTIVAL
  Filled 2022-12-20: qty 2

## 2022-12-20 MED ORDER — DEXAMETHASONE SODIUM PHOSPHATE 10 MG/ML IJ SOLN
INTRAMUSCULAR | Status: AC
  Filled 2022-12-20: qty 1

## 2022-12-20 MED ORDER — PHENYLEPHRINE HCL 10 % OP SOLN
1.0000 [drp] | OPHTHALMIC | Status: AC | PRN
  Administered 2022-12-20 (×3): 1 [drp] via OPHTHALMIC
  Filled 2022-12-20: qty 5

## 2022-12-20 MED ORDER — SODIUM HYALURONATE 10 MG/ML IO SOLUTION
PREFILLED_SYRINGE | INTRAOCULAR | Status: DC | PRN
  Administered 2022-12-20: .85 mL via INTRAOCULAR

## 2022-12-20 MED ORDER — MIDAZOLAM HCL 2 MG/2ML IJ SOLN
INTRAMUSCULAR | Status: AC
  Filled 2022-12-20: qty 2

## 2022-12-20 MED ORDER — BSS PLUS IO SOLN
INTRAOCULAR | Status: AC
  Filled 2022-12-20: qty 500

## 2022-12-20 MED ORDER — BSS IO SOLN
INTRAOCULAR | Status: AC
  Filled 2022-12-20: qty 15

## 2022-12-20 MED ORDER — SODIUM CHLORIDE 0.9 % IV SOLN: INTRAVENOUS | Status: DC

## 2022-12-20 MED ORDER — ESMOLOL HCL 100 MG/10ML IV SOLN
INTRAVENOUS | Status: AC
  Filled 2022-12-20: qty 10

## 2022-12-20 MED ORDER — PHENYLEPHRINE HCL 2.5 % OP SOLN
1.0000 [drp] | OPHTHALMIC | Status: AC | PRN
  Administered 2022-12-20 (×3): 1 [drp] via OPHTHALMIC
  Filled 2022-12-20: qty 2

## 2022-12-20 MED ORDER — TETRACAINE HCL 0.5 % OP SOLN
OPHTHALMIC | Status: DC | PRN
  Administered 2022-12-20: 3 [drp] via OPHTHALMIC

## 2022-12-20 MED ORDER — SODIUM HYALURONATE 10 MG/ML IO SOLUTION
PREFILLED_SYRINGE | INTRAOCULAR | Status: AC
  Filled 2022-12-20: qty 0.85

## 2022-12-20 MED ORDER — VASOPRESSIN 20 UNIT/ML IV SOLN
INTRAVENOUS | Status: AC
  Filled 2022-12-20: qty 1

## 2022-12-20 MED ORDER — TRIAMCINOLONE ACETONIDE 40 MG/ML IJ SUSP
INTRAMUSCULAR | Status: AC
  Filled 2022-12-20: qty 5

## 2022-12-20 MED ORDER — TETRACAINE HCL 0.5 % OP SOLN
OPHTHALMIC | Status: AC
  Filled 2022-12-20: qty 4

## 2022-12-20 MED ORDER — ORAL CARE MOUTH RINSE: 15.0000 mL | Freq: Once | OROMUCOSAL | Status: AC

## 2022-12-20 MED ORDER — PROPOFOL 10 MG/ML IV BOLUS
INTRAVENOUS | Status: AC
  Filled 2022-12-20: qty 20

## 2022-12-20 MED ORDER — BUPIVACAINE HCL (PF) 0.75 % IJ SOLN
INTRAMUSCULAR | Status: AC
  Filled 2022-12-20: qty 10

## 2022-12-20 MED ORDER — HYALURONIDASE HUMAN 150 UNIT/ML IJ SOLN
INTRAMUSCULAR | Status: AC
  Filled 2022-12-20: qty 1

## 2022-12-20 MED ORDER — EPINEPHRINE PF 1 MG/ML IJ SOLN
INTRAOCULAR | Status: DC | PRN
  Administered 2022-12-20: 500.3 mL

## 2022-12-20 MED ORDER — DEXAMETHASONE SODIUM PHOSPHATE 10 MG/ML IJ SOLN
INTRAMUSCULAR | Status: DC | PRN
  Administered 2022-12-20: 10 mg

## 2022-12-20 SURGICAL SUPPLY — 74 items
APL SWBSTK 6 STRL LF DISP (MISCELLANEOUS) ×1
APPLICATOR COTTON TIP 6 STRL (MISCELLANEOUS) ×1 IMPLANT
APPLICATOR COTTON TIP 6IN STRL (MISCELLANEOUS) ×1
BAND WRIST GAS GREEN (MISCELLANEOUS) IMPLANT
BLADE MVR KNIFE 20G (BLADE) IMPLANT
BLADE STAB KNIFE 15DEG (BLADE) IMPLANT
BNDG EYE OVAL 2 1/8 X 2 5/8 (GAUZE/BANDAGES/DRESSINGS) IMPLANT
CANNULA ANT CHAM MAIN (OPHTHALMIC RELATED) IMPLANT
CANNULA DUAL BORE 23G (CANNULA) IMPLANT
CANNULA DUALBORE 25G (CANNULA) IMPLANT
CANNULA VLV SOFT TIP 25G (OPHTHALMIC) ×1 IMPLANT
CANNULA VLV SOFT TIP 25GA (OPHTHALMIC) IMPLANT
CAUTERY EYE LOW TEMP 1300F FIN (OPHTHALMIC RELATED) IMPLANT
CHANDELIER CONSTEL 25G RFID (OPHTHALMIC) IMPLANT
CLSR STERI-STRIP ANTIMIC 1/2X4 (GAUZE/BANDAGES/DRESSINGS) ×1 IMPLANT
COVER MAYO STAND STRL (DRAPES) IMPLANT
DRAPE HALF SHEET 40X57 (DRAPES) ×1 IMPLANT
DRAPE INCISE 51X51 W/FILM STRL (DRAPES) IMPLANT
DRAPE RETRACTOR (MISCELLANEOUS) ×1 IMPLANT
ERASER HMR WETFIELD 23G BP (MISCELLANEOUS) IMPLANT
FORCEPS ECKARDT ILM 25G SERR (OPHTHALMIC RELATED) IMPLANT
FORCEPS GRIESHABER ILM 25G A (INSTRUMENTS) IMPLANT
FORCEPS GRIESHABER MAX 25G (MISCELLANEOUS) IMPLANT
GAS AUTO FILL CONSTEL (OPHTHALMIC)
GAS AUTO FILL CONSTELLATION (OPHTHALMIC) IMPLANT
GAS WRIST BAND GREEN (MISCELLANEOUS)
GLOVE SURG SYN 7.5  E (GLOVE) ×1
GLOVE SURG SYN 7.5 E (GLOVE) ×1 IMPLANT
GLOVE SURG SYN 7.5 PF PI (GLOVE) ×1 IMPLANT
GOWN STRL REUS W/ TWL LRG LVL3 (GOWN DISPOSABLE) ×1 IMPLANT
GOWN STRL REUS W/TWL LRG LVL3 (GOWN DISPOSABLE) ×3
KIT BASIN OR (CUSTOM PROCEDURE TRAY) ×1 IMPLANT
KIT TURNOVER KIT B (KITS) ×1 IMPLANT
LENS BIOM SUPER VIEW SET DISP (MISCELLANEOUS) ×1 IMPLANT
MICROPICK 25G (MISCELLANEOUS)
NDL 18GX1X1/2 (RX/OR ONLY) (NEEDLE) ×1 IMPLANT
NDL 25GX 5/8IN NON SAFETY (NEEDLE) ×1 IMPLANT
NDL 27GX1/2 REG BEVEL ECLIP (NEEDLE) ×1 IMPLANT
NDL FILTER BLUNT 18X1 1/2 (NEEDLE) ×1 IMPLANT
NDL HYPO 25GX1X1/2 BEV (NEEDLE) IMPLANT
NDL HYPO 30X.5 LL (NEEDLE) ×2 IMPLANT
NDL RETROBULBAR 25GX1.5 (NEEDLE) ×1 IMPLANT
NEEDLE 18GX1X1/2 (RX/OR ONLY) (NEEDLE) ×3 IMPLANT
NEEDLE 25GX 5/8IN NON SAFETY (NEEDLE) ×1 IMPLANT
NEEDLE 27GX1/2 REG BEVEL ECLIP (NEEDLE) ×1 IMPLANT
NEEDLE FILTER BLUNT 18X1 1/2 (NEEDLE) ×1 IMPLANT
NEEDLE HYPO 25GX1X1/2 BEV (NEEDLE) IMPLANT
NEEDLE HYPO 30X.5 LL (NEEDLE) ×2 IMPLANT
NEEDLE RETROBULBAR 25GX1.5 (NEEDLE) ×1 IMPLANT
NS IRRIG 1000ML POUR BTL (IV SOLUTION) ×1 IMPLANT
OIL SILICONE OPHTHALMIC 1000 (Ophthalmic Related) IMPLANT
PACK FRAGMATOME (OPHTHALMIC) IMPLANT
PACK VITRECTOMY CUSTOM (CUSTOM PROCEDURE TRAY) ×1 IMPLANT
PAD ARMBOARD 7.5X6 YLW CONV (MISCELLANEOUS) ×2 IMPLANT
PAK PIK VITRECTOMY CVS 25GA (OPHTHALMIC) ×1 IMPLANT
PIC ILLUMINATED 25G (OPHTHALMIC) ×1
PICK MICROPICK 25G (MISCELLANEOUS) IMPLANT
PIK ILLUMINATED 25G (OPHTHALMIC) IMPLANT
PROBE ENDO DIATHERMY 25G (MISCELLANEOUS) IMPLANT
PROBE LASER ILLUM FLEX CVD 25G (OPHTHALMIC) IMPLANT
ROLLS DENTAL (MISCELLANEOUS) IMPLANT
SCRAPER DIAMOND 25GA (OPHTHALMIC RELATED) IMPLANT
SET INJECTOR OIL FLUID CONSTEL (OPHTHALMIC) IMPLANT
SHIELD EYE LENSE ONLY DISP (GAUZE/BANDAGES/DRESSINGS) IMPLANT
SOL ANTI FOG 6CC (MISCELLANEOUS) ×1 IMPLANT
STOPCOCK 4 WAY LG BORE MALE ST (IV SETS) IMPLANT
SUT VICRYL 7 0 TG140 8 (SUTURE) ×1 IMPLANT
SUT VICRYL 8 0 TG140 8 (SUTURE) IMPLANT
SYR 10ML LL (SYRINGE) IMPLANT
SYR 20ML LL LF (SYRINGE) ×1 IMPLANT
SYR 5ML LL (SYRINGE) ×1 IMPLANT
SYR TB 1ML LUER SLIP (SYRINGE) IMPLANT
WATER STERILE IRR 1000ML POUR (IV SOLUTION) ×1 IMPLANT
WIPE INSTRUMENT VISIWIPE 73X73 (MISCELLANEOUS) IMPLANT

## 2022-12-20 NOTE — Transfer of Care (Signed)
Immediate Anesthesia Transfer of Care Note  Patient: Charles Hart  Procedure(s) Performed: REPAIR OF COMPLEX TRACTION RETINAL DETACHMENT (Left: Eye) INJECTION OF SILICONE OIL (Left: Eye) PHOTOCOAGULATION WITH LASER (Left: Eye) PARS PLANA VITRECTOMY WITH 25 GAUGE (Left: Eye)  Patient Location: PACU  Anesthesia Type:MAC  Level of Consciousness: awake, alert , and oriented  Airway & Oxygen Therapy: Patient Spontanous Breathing  Post-op Assessment: Report given to RN, Post -op Vital signs reviewed and stable, and Patient moving all extremities X 4  Post vital signs: Reviewed and stable  Last Vitals:  Vitals Value Taken Time  BP 132/82 12/20/22 1856  Temp    Pulse 76 12/20/22 1859  Resp 20 12/20/22 1859  SpO2 98 % 12/20/22 1859  Vitals shown include unvalidated device data.  Last Pain:  Vitals:   12/20/22 1359  TempSrc:   PainSc: 0-No pain         Complications: There were no known notable events for this encounter.

## 2022-12-20 NOTE — Brief Op Note (Signed)
12/20/2022  6:48 PM  PATIENT:  Charles Hart  67 y.o. 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  PROCEDURE:  Procedure(s) with comments: REPAIR OF COMPLEX TRACTION RETINAL DETACHMENT (Left) - MAC WITH BLOCK INJECTION OF SILICONE OIL (Left) - MAC WITH BLOCK PHOTOCOAGULATION WITH LASER (Left) PARS PLANA VITRECTOMY WITH 25 GAUGE (Left) MEMBRANECTOMY (Left)  SURGEON:  Surgeon(s) and Role:    * Carmela Rima, MD - Primary  PHYSICIAN ASSISTANT:   ASSISTANTS: none   ANESTHESIA:   local and MAC  EBL:  minimal   BLOOD ADMINISTERED:none  DRAINS: none   LOCAL MEDICATIONS USED:  MARCAINE    and LIDOCAINE   SPECIMEN:  No Specimen  DISPOSITION OF SPECIMEN:  N/A  COUNTS:  YES  TOURNIQUET:  * No tourniquets in log *  DICTATION: .Note written in EPIC  PLAN OF CARE: Discharge to home after PACU  PATIENT DISPOSITION:  PACU - hemodynamically stable.   Delay start of Pharmacological VTE agent (>24hrs) due to surgical blood loss or risk of bleeding: not applicable

## 2022-12-20 NOTE — H&P (Signed)
Date of examination:  12/20/22  Indication for surgery: tractional retinal detachment left eye  Pertinent past medical history:  Past Medical History:  Diagnosis Date   Adenomatous colon polyp 07/02/2011   Last colonoscopy May 06, 2011 by Dr. Rob Bunting, who recommended repeat colonoscopy in 5 years.    Anemia    Background diabetic retinopathy 04/20/2012   Patient is followed by Dr. Dione Booze    Cancer [facility])    Kidney   Cardiomyopathy    LV function improved from 2004 to 2008.  Historically, moderately dilated LV with EF 30-40% by 2D echo 08/14/2002.  Mild CAD with severe LV dysfunction by cardiac cath 09/2002.  Normal coronary arteries and normal LV function by cardiac cath 09/19/2006.  A 2-D echo on 04/01/2009 showed mild concentric hypertrophy and normal systolic (LVEF  60-65%) and doppler C/W with grade 1 diastolic dysfunction.   CHF (congestive heart failure) (HCC)    LV function improved from 2004 to 2008.  Historically, moderately dilated LV with EF 30-40% by 2D echo 08/14/2002.  Mild CAD with severe LV dysfunction by cardiac cath 09/2002.  Normal coronary arteries and normal LV function by cardiac cath 09/19/2006.  A 2-D echo on 04/01/2009 showed mild concentric hypertrophy and normal systolic (LVEF  60-65%) and doppler C/W with grade 1 diastolic dysfunction..    Chronic combined systolic and diastolic congestive heart failure (HCC) 05/21/2010   LV function improved from 2004 to 2008.  Historically, moderately dilated LV with EF 30-40% by 2D echo 08/14/2002.  Mild CAD with severe LV dysfunction by cardiac cath 09/2002.  Normal coronary arteries and normal LV function by cardiac cath 09/19/2006.  A 2-D echo on 04/01/2009 showed mild concentric hypertrophy and normal systolic (LVEF  60-65%) and doppler parameters consistent with abnormal left    CVA (cerebral vascular accident) (HCC) 07/04/2012   MRI of the brain 07/04/2012 showed an acute infarct in the right basal ganglia involving the  anterior putamen, anterior limb internal capsule, and head of the caudate; this measured approximately 2.5 cm in diameter.      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   Qualifier: Diagnosis of  By: Meredith Pel MD, Dorene Sorrow     DM neuropathy, painful (HCC)    fingers and right knee   ESRD (end stage renal disease) (HCC)    Stage 4, on hemodailysis x 4 months as of 05-18-18 [facility], tues thurs sat   ESRD (end stage renal disease) on dialysis (HCC)    "TTS; [facility]" (05/31/2018)   Hearing loss in right ear    Hyperlipidemia    Hypertension    Hypertensive crisis 07/28/2012   Hypertensive urgency 08/20/2014   Myocardial infarction [facility])  many yrs ago   Nephrotic syndrome 02/18/2013   A 24-hour urine collection 03/04/2013 showed total protein of 5,460 g and creatinine clearance of 80 mL/minute.  Patient was seen by Loma Sender at [facility] and a repeat 24-hour urine showed 10,407 mg protein.  Patient underwent kidney biopsy on 05/30/2013; pathology showed advanced diffuse and nodular diabetic nephropathy with vascular changes consistent with long-standing difficult to control hypertension.      Pneumonia 2014 and 2015   Renal insufficiency    Sleep apnea    11/06/20 - hasn't used it "in a while"   Surgical pneumoperitoneum    Trochanteric bursitis of right hip 04/25/2018  Pertinent ocular history:  tractional retinal detachment left eye  Pertinent family history:  Family History  Problem Relation Age of Onset   Aneurysm Father 72       died of rupture   Colon cancer Sister     General:  Healthy appearing patient in no distress.    Eyes:    Acuity OS 20/200    External: Within normal limits      Anterior segment: Within normal limits      Fundus: Tractional retinal detachment left eye   Impression: Tractional retinal detachment left eye  Plan:   Tractional retinal detachment repair left eye  Carmela Rima, MD

## 2022-12-20 NOTE — Discharge Instructions (Addendum)
DO NOT SLEEP ON BACK, THE EYE PRESSURE CAN GO UP AND CAUSE VISION LOSS   SLEEP ON SIDE WITH NOSE TO PILLOW  DURING DAY KEEP UPRIGHT 

## 2022-12-21 ENCOUNTER — Encounter (HOSPITAL_COMMUNITY): Payer: Self-pay | Admitting: Ophthalmology

## 2022-12-21 NOTE — Anesthesia Postprocedure Evaluation (Signed)
Anesthesia Post Note  Patient: Charles Hart  Procedure(s) Performed: REPAIR OF COMPLEX TRACTION RETINAL DETACHMENT (Left: Eye) INJECTION OF SILICONE OIL (Left: Eye) PHOTOCOAGULATION WITH LASER (Left: Eye) PARS PLANA VITRECTOMY WITH 25 GAUGE (Left: Eye)     Patient location during evaluation: PACU Anesthesia Type: MAC Level of consciousness: awake and alert Pain management: pain level controlled Vital Signs Assessment: post-procedure vital signs reviewed and stable Respiratory status: spontaneous breathing, nonlabored ventilation, respiratory function stable and patient connected to nasal cannula oxygen Cardiovascular status: stable and blood pressure returned to baseline Postop Assessment: no apparent nausea or vomiting Anesthetic complications: no   There were no known notable events for this encounter.  Last Vitals:  Vitals:   12/20/22 1900 12/20/22 1915  BP: 131/77 137/82  Pulse: 74 78  Resp: (!) 24 16  Temp:  36.9 C  SpO2: 97% 96%    Last Pain:  Vitals:   12/20/22 1915  TempSrc:   PainSc: 0-No pain                 Earl Lites P Jakyrah Holladay

## 2023-01-11 NOTE — Op Note (Signed)
Charles Hart 12/20/2022 Diagnosis: Tractional retinal detachment left eye  Procedure: [place]ana Vitrectomy, Membrane Peeling, Endolaser, Silicone Oil, and membranectomy Operative Eye:  left eye  Surgeon: Harrold Donath Estimated Blood Loss: minimal Specimens for Pathology:  None Complications: none    After informed consent was obtained, the patient was brought to the operating room and a time-out confirmed the correct operative eye as the left eye. Retrobulbar anesthesia was obtained in the left eye without complication.  The  patient was prepped and draped in the usual fashion for ocular surgery on the  left eye .  A lid speculum was placed.  Infusion line and trocar was placed at the 4 o'clock position approximately 3.5 mm from the surgical limbus.   The infusion line was allowed to run and then clamped when placed at the cannula opening. The line was inserted and secured to the drape with an adhesive strip.   Active trocars/cannula were placed at the 10 and 2 o'clock positions approximately 3.5 mm from the surgical limbus. The cannula was visualized in the vitreous cavity.  The light pipe and vitreous cutter were inserted into the vitreous cavity and a core vitrectomy was performed.  Care taken to remove the vitreous up to the vitreous base for 360 degrees.   Attention was directed toward relieving the tractional detachment from the posterior pole in particular surrounding the arcades and involving the central macula. This was done carefully at the disc and surrounding arcades. There was notable neovascular fronds with traction, gliosis and very atrophic retina.  Care was taken to elevate the membranes and remove them both with a vitrector and a light pipe with dissection. Continued dissection of membranes and removal of membranes was performed including the nasal and inferior retina. After membranectomy, endolaser was applied to the areas where the neovascular fronds were still  present.  3 rows of endolaser were applied 360 degrees to the periphery.  A complete air-fluid exchange was then performed and additional endolaser was applied.  1000 centistoke silicone oil was injected into the eye. The trocars were sequentially removed and all were noted to be sealed. Subconjunctival injections of Ancef and Decadron were placed.   The speculum and drapes were removed and the eye was patched with Polymixin/Bacitracin ophthalmic ointment. An eye shield was placed and the patient was transferred alert and conversant with stable vital signs to the post operative recovery area.  The patient tolerated the procedure well and no complications were noted.   Harrold Donath MD

## 2023-02-14 DIAGNOSIS — D689 Coagulation defect, unspecified: Secondary | ICD-10-CM | POA: Diagnosis not present

## 2023-02-14 DIAGNOSIS — E1129 Type 2 diabetes mellitus with other diabetic kidney complication: Secondary | ICD-10-CM | POA: Diagnosis not present

## 2023-02-14 DIAGNOSIS — N2581 Secondary hyperparathyroidism of renal origin: Secondary | ICD-10-CM | POA: Diagnosis not present

## 2023-02-14 DIAGNOSIS — E162 Hypoglycemia, unspecified: Secondary | ICD-10-CM | POA: Diagnosis not present

## 2023-02-14 DIAGNOSIS — N186 End stage renal disease: Secondary | ICD-10-CM | POA: Diagnosis not present

## 2023-02-14 DIAGNOSIS — E876 Hypokalemia: Secondary | ICD-10-CM | POA: Diagnosis not present

## 2023-02-14 DIAGNOSIS — Z992 Dependence on renal dialysis: Secondary | ICD-10-CM | POA: Diagnosis not present

## 2023-02-16 DIAGNOSIS — E162 Hypoglycemia, unspecified: Secondary | ICD-10-CM | POA: Diagnosis not present

## 2023-02-16 DIAGNOSIS — E1129 Type 2 diabetes mellitus with other diabetic kidney complication: Secondary | ICD-10-CM | POA: Diagnosis not present

## 2023-02-16 DIAGNOSIS — N2581 Secondary hyperparathyroidism of renal origin: Secondary | ICD-10-CM | POA: Diagnosis not present

## 2023-02-16 DIAGNOSIS — D689 Coagulation defect, unspecified: Secondary | ICD-10-CM | POA: Diagnosis not present

## 2023-02-16 DIAGNOSIS — E876 Hypokalemia: Secondary | ICD-10-CM | POA: Diagnosis not present

## 2023-02-16 DIAGNOSIS — N186 End stage renal disease: Secondary | ICD-10-CM | POA: Diagnosis not present

## 2023-02-16 DIAGNOSIS — Z992 Dependence on renal dialysis: Secondary | ICD-10-CM | POA: Diagnosis not present

## 2023-02-18 DIAGNOSIS — N186 End stage renal disease: Secondary | ICD-10-CM | POA: Diagnosis not present

## 2023-02-18 DIAGNOSIS — Z992 Dependence on renal dialysis: Secondary | ICD-10-CM | POA: Diagnosis not present

## 2023-02-18 DIAGNOSIS — E1129 Type 2 diabetes mellitus with other diabetic kidney complication: Secondary | ICD-10-CM | POA: Diagnosis not present

## 2023-02-18 DIAGNOSIS — E876 Hypokalemia: Secondary | ICD-10-CM | POA: Diagnosis not present

## 2023-02-18 DIAGNOSIS — D689 Coagulation defect, unspecified: Secondary | ICD-10-CM | POA: Diagnosis not present

## 2023-02-18 DIAGNOSIS — N2581 Secondary hyperparathyroidism of renal origin: Secondary | ICD-10-CM | POA: Diagnosis not present

## 2023-02-18 DIAGNOSIS — E162 Hypoglycemia, unspecified: Secondary | ICD-10-CM | POA: Diagnosis not present

## 2023-02-21 DIAGNOSIS — E876 Hypokalemia: Secondary | ICD-10-CM | POA: Diagnosis not present

## 2023-02-21 DIAGNOSIS — N2581 Secondary hyperparathyroidism of renal origin: Secondary | ICD-10-CM | POA: Diagnosis not present

## 2023-02-21 DIAGNOSIS — E1129 Type 2 diabetes mellitus with other diabetic kidney complication: Secondary | ICD-10-CM | POA: Diagnosis not present

## 2023-02-21 DIAGNOSIS — D689 Coagulation defect, unspecified: Secondary | ICD-10-CM | POA: Diagnosis not present

## 2023-02-21 DIAGNOSIS — Z992 Dependence on renal dialysis: Secondary | ICD-10-CM | POA: Diagnosis not present

## 2023-02-21 DIAGNOSIS — E162 Hypoglycemia, unspecified: Secondary | ICD-10-CM | POA: Diagnosis not present

## 2023-02-21 DIAGNOSIS — N186 End stage renal disease: Secondary | ICD-10-CM | POA: Diagnosis not present

## 2023-02-23 DIAGNOSIS — N186 End stage renal disease: Secondary | ICD-10-CM | POA: Diagnosis not present

## 2023-02-23 DIAGNOSIS — Z992 Dependence on renal dialysis: Secondary | ICD-10-CM | POA: Diagnosis not present

## 2023-02-23 DIAGNOSIS — N2581 Secondary hyperparathyroidism of renal origin: Secondary | ICD-10-CM | POA: Diagnosis not present

## 2023-02-23 DIAGNOSIS — E162 Hypoglycemia, unspecified: Secondary | ICD-10-CM | POA: Diagnosis not present

## 2023-02-23 DIAGNOSIS — E1129 Type 2 diabetes mellitus with other diabetic kidney complication: Secondary | ICD-10-CM | POA: Diagnosis not present

## 2023-02-23 DIAGNOSIS — E876 Hypokalemia: Secondary | ICD-10-CM | POA: Diagnosis not present

## 2023-02-23 DIAGNOSIS — D689 Coagulation defect, unspecified: Secondary | ICD-10-CM | POA: Diagnosis not present

## 2023-02-25 DIAGNOSIS — Z992 Dependence on renal dialysis: Secondary | ICD-10-CM | POA: Diagnosis not present

## 2023-02-25 DIAGNOSIS — E876 Hypokalemia: Secondary | ICD-10-CM | POA: Diagnosis not present

## 2023-02-25 DIAGNOSIS — N2581 Secondary hyperparathyroidism of renal origin: Secondary | ICD-10-CM | POA: Diagnosis not present

## 2023-02-25 DIAGNOSIS — N186 End stage renal disease: Secondary | ICD-10-CM | POA: Diagnosis not present

## 2023-02-25 DIAGNOSIS — D689 Coagulation defect, unspecified: Secondary | ICD-10-CM | POA: Diagnosis not present

## 2023-02-25 DIAGNOSIS — E1129 Type 2 diabetes mellitus with other diabetic kidney complication: Secondary | ICD-10-CM | POA: Diagnosis not present

## 2023-02-25 DIAGNOSIS — E162 Hypoglycemia, unspecified: Secondary | ICD-10-CM | POA: Diagnosis not present

## 2023-02-28 DIAGNOSIS — N186 End stage renal disease: Secondary | ICD-10-CM | POA: Diagnosis not present

## 2023-02-28 DIAGNOSIS — E1129 Type 2 diabetes mellitus with other diabetic kidney complication: Secondary | ICD-10-CM | POA: Diagnosis not present

## 2023-02-28 DIAGNOSIS — Z992 Dependence on renal dialysis: Secondary | ICD-10-CM | POA: Diagnosis not present

## 2023-02-28 DIAGNOSIS — E876 Hypokalemia: Secondary | ICD-10-CM | POA: Diagnosis not present

## 2023-02-28 DIAGNOSIS — D689 Coagulation defect, unspecified: Secondary | ICD-10-CM | POA: Diagnosis not present

## 2023-02-28 DIAGNOSIS — N2581 Secondary hyperparathyroidism of renal origin: Secondary | ICD-10-CM | POA: Diagnosis not present

## 2023-02-28 DIAGNOSIS — E162 Hypoglycemia, unspecified: Secondary | ICD-10-CM | POA: Diagnosis not present

## 2023-03-02 DIAGNOSIS — E162 Hypoglycemia, unspecified: Secondary | ICD-10-CM | POA: Diagnosis not present

## 2023-03-02 DIAGNOSIS — Z992 Dependence on renal dialysis: Secondary | ICD-10-CM | POA: Diagnosis not present

## 2023-03-02 DIAGNOSIS — E1129 Type 2 diabetes mellitus with other diabetic kidney complication: Secondary | ICD-10-CM | POA: Diagnosis not present

## 2023-03-02 DIAGNOSIS — N2581 Secondary hyperparathyroidism of renal origin: Secondary | ICD-10-CM | POA: Diagnosis not present

## 2023-03-02 DIAGNOSIS — E876 Hypokalemia: Secondary | ICD-10-CM | POA: Diagnosis not present

## 2023-03-02 DIAGNOSIS — D689 Coagulation defect, unspecified: Secondary | ICD-10-CM | POA: Diagnosis not present

## 2023-03-02 DIAGNOSIS — N186 End stage renal disease: Secondary | ICD-10-CM | POA: Diagnosis not present

## 2023-03-04 DIAGNOSIS — E1129 Type 2 diabetes mellitus with other diabetic kidney complication: Secondary | ICD-10-CM | POA: Diagnosis not present

## 2023-03-04 DIAGNOSIS — D689 Coagulation defect, unspecified: Secondary | ICD-10-CM | POA: Diagnosis not present

## 2023-03-04 DIAGNOSIS — N2581 Secondary hyperparathyroidism of renal origin: Secondary | ICD-10-CM | POA: Diagnosis not present

## 2023-03-04 DIAGNOSIS — N186 End stage renal disease: Secondary | ICD-10-CM | POA: Diagnosis not present

## 2023-03-04 DIAGNOSIS — Z992 Dependence on renal dialysis: Secondary | ICD-10-CM | POA: Diagnosis not present

## 2023-03-04 DIAGNOSIS — E876 Hypokalemia: Secondary | ICD-10-CM | POA: Diagnosis not present

## 2023-03-04 DIAGNOSIS — E162 Hypoglycemia, unspecified: Secondary | ICD-10-CM | POA: Diagnosis not present

## 2023-03-07 DIAGNOSIS — N186 End stage renal disease: Secondary | ICD-10-CM | POA: Diagnosis not present

## 2023-03-07 DIAGNOSIS — N2581 Secondary hyperparathyroidism of renal origin: Secondary | ICD-10-CM | POA: Diagnosis not present

## 2023-03-07 DIAGNOSIS — E1129 Type 2 diabetes mellitus with other diabetic kidney complication: Secondary | ICD-10-CM | POA: Diagnosis not present

## 2023-03-07 DIAGNOSIS — E162 Hypoglycemia, unspecified: Secondary | ICD-10-CM | POA: Diagnosis not present

## 2023-03-07 DIAGNOSIS — D689 Coagulation defect, unspecified: Secondary | ICD-10-CM | POA: Diagnosis not present

## 2023-03-07 DIAGNOSIS — Z992 Dependence on renal dialysis: Secondary | ICD-10-CM | POA: Diagnosis not present

## 2023-03-07 DIAGNOSIS — E876 Hypokalemia: Secondary | ICD-10-CM | POA: Diagnosis not present

## 2023-03-09 DIAGNOSIS — N186 End stage renal disease: Secondary | ICD-10-CM | POA: Diagnosis not present

## 2023-03-09 DIAGNOSIS — E162 Hypoglycemia, unspecified: Secondary | ICD-10-CM | POA: Diagnosis not present

## 2023-03-09 DIAGNOSIS — E1129 Type 2 diabetes mellitus with other diabetic kidney complication: Secondary | ICD-10-CM | POA: Diagnosis not present

## 2023-03-09 DIAGNOSIS — Z992 Dependence on renal dialysis: Secondary | ICD-10-CM | POA: Diagnosis not present

## 2023-03-09 DIAGNOSIS — E876 Hypokalemia: Secondary | ICD-10-CM | POA: Diagnosis not present

## 2023-03-09 DIAGNOSIS — N2581 Secondary hyperparathyroidism of renal origin: Secondary | ICD-10-CM | POA: Diagnosis not present

## 2023-03-09 DIAGNOSIS — D689 Coagulation defect, unspecified: Secondary | ICD-10-CM | POA: Diagnosis not present

## 2023-03-11 DIAGNOSIS — N2581 Secondary hyperparathyroidism of renal origin: Secondary | ICD-10-CM | POA: Diagnosis not present

## 2023-03-11 DIAGNOSIS — N186 End stage renal disease: Secondary | ICD-10-CM | POA: Diagnosis not present

## 2023-03-11 DIAGNOSIS — E876 Hypokalemia: Secondary | ICD-10-CM | POA: Diagnosis not present

## 2023-03-11 DIAGNOSIS — D689 Coagulation defect, unspecified: Secondary | ICD-10-CM | POA: Diagnosis not present

## 2023-03-11 DIAGNOSIS — E162 Hypoglycemia, unspecified: Secondary | ICD-10-CM | POA: Diagnosis not present

## 2023-03-11 DIAGNOSIS — E1129 Type 2 diabetes mellitus with other diabetic kidney complication: Secondary | ICD-10-CM | POA: Diagnosis not present

## 2023-03-11 DIAGNOSIS — Z992 Dependence on renal dialysis: Secondary | ICD-10-CM | POA: Diagnosis not present

## 2023-03-14 DIAGNOSIS — Z992 Dependence on renal dialysis: Secondary | ICD-10-CM | POA: Diagnosis not present

## 2023-03-14 DIAGNOSIS — E1129 Type 2 diabetes mellitus with other diabetic kidney complication: Secondary | ICD-10-CM | POA: Diagnosis not present

## 2023-03-14 DIAGNOSIS — N186 End stage renal disease: Secondary | ICD-10-CM | POA: Diagnosis not present

## 2023-03-14 DIAGNOSIS — D689 Coagulation defect, unspecified: Secondary | ICD-10-CM | POA: Diagnosis not present

## 2023-03-14 DIAGNOSIS — E162 Hypoglycemia, unspecified: Secondary | ICD-10-CM | POA: Diagnosis not present

## 2023-03-14 DIAGNOSIS — E876 Hypokalemia: Secondary | ICD-10-CM | POA: Diagnosis not present

## 2023-03-14 DIAGNOSIS — N2581 Secondary hyperparathyroidism of renal origin: Secondary | ICD-10-CM | POA: Diagnosis not present

## 2023-03-15 DIAGNOSIS — E1129 Type 2 diabetes mellitus with other diabetic kidney complication: Secondary | ICD-10-CM | POA: Diagnosis not present

## 2023-03-15 DIAGNOSIS — N186 End stage renal disease: Secondary | ICD-10-CM | POA: Diagnosis not present

## 2023-03-15 DIAGNOSIS — Z992 Dependence on renal dialysis: Secondary | ICD-10-CM | POA: Diagnosis not present

## 2023-03-16 DIAGNOSIS — G4733 Obstructive sleep apnea (adult) (pediatric): Secondary | ICD-10-CM | POA: Diagnosis not present

## 2023-03-16 DIAGNOSIS — E1129 Type 2 diabetes mellitus with other diabetic kidney complication: Secondary | ICD-10-CM | POA: Diagnosis not present

## 2023-03-16 DIAGNOSIS — E039 Hypothyroidism, unspecified: Secondary | ICD-10-CM | POA: Diagnosis not present

## 2023-03-16 DIAGNOSIS — D689 Coagulation defect, unspecified: Secondary | ICD-10-CM | POA: Diagnosis not present

## 2023-03-16 DIAGNOSIS — N2581 Secondary hyperparathyroidism of renal origin: Secondary | ICD-10-CM | POA: Diagnosis not present

## 2023-03-16 DIAGNOSIS — N186 End stage renal disease: Secondary | ICD-10-CM | POA: Diagnosis not present

## 2023-03-16 DIAGNOSIS — Z992 Dependence on renal dialysis: Secondary | ICD-10-CM | POA: Diagnosis not present

## 2023-03-16 DIAGNOSIS — E876 Hypokalemia: Secondary | ICD-10-CM | POA: Diagnosis not present

## 2023-03-18 DIAGNOSIS — D689 Coagulation defect, unspecified: Secondary | ICD-10-CM | POA: Diagnosis not present

## 2023-03-18 DIAGNOSIS — E039 Hypothyroidism, unspecified: Secondary | ICD-10-CM | POA: Diagnosis not present

## 2023-03-18 DIAGNOSIS — N186 End stage renal disease: Secondary | ICD-10-CM | POA: Diagnosis not present

## 2023-03-18 DIAGNOSIS — N2581 Secondary hyperparathyroidism of renal origin: Secondary | ICD-10-CM | POA: Diagnosis not present

## 2023-03-18 DIAGNOSIS — E876 Hypokalemia: Secondary | ICD-10-CM | POA: Diagnosis not present

## 2023-03-18 DIAGNOSIS — Z992 Dependence on renal dialysis: Secondary | ICD-10-CM | POA: Diagnosis not present

## 2023-03-18 DIAGNOSIS — E1129 Type 2 diabetes mellitus with other diabetic kidney complication: Secondary | ICD-10-CM | POA: Diagnosis not present

## 2023-03-21 DIAGNOSIS — E876 Hypokalemia: Secondary | ICD-10-CM | POA: Diagnosis not present

## 2023-03-21 DIAGNOSIS — D689 Coagulation defect, unspecified: Secondary | ICD-10-CM | POA: Diagnosis not present

## 2023-03-21 DIAGNOSIS — E1129 Type 2 diabetes mellitus with other diabetic kidney complication: Secondary | ICD-10-CM | POA: Diagnosis not present

## 2023-03-21 DIAGNOSIS — E039 Hypothyroidism, unspecified: Secondary | ICD-10-CM | POA: Diagnosis not present

## 2023-03-21 DIAGNOSIS — Z992 Dependence on renal dialysis: Secondary | ICD-10-CM | POA: Diagnosis not present

## 2023-03-21 DIAGNOSIS — N2581 Secondary hyperparathyroidism of renal origin: Secondary | ICD-10-CM | POA: Diagnosis not present

## 2023-03-21 DIAGNOSIS — N186 End stage renal disease: Secondary | ICD-10-CM | POA: Diagnosis not present

## 2023-03-23 DIAGNOSIS — N2581 Secondary hyperparathyroidism of renal origin: Secondary | ICD-10-CM | POA: Diagnosis not present

## 2023-03-23 DIAGNOSIS — N186 End stage renal disease: Secondary | ICD-10-CM | POA: Diagnosis not present

## 2023-03-23 DIAGNOSIS — E876 Hypokalemia: Secondary | ICD-10-CM | POA: Diagnosis not present

## 2023-03-23 DIAGNOSIS — E1129 Type 2 diabetes mellitus with other diabetic kidney complication: Secondary | ICD-10-CM | POA: Diagnosis not present

## 2023-03-23 DIAGNOSIS — E039 Hypothyroidism, unspecified: Secondary | ICD-10-CM | POA: Diagnosis not present

## 2023-03-23 DIAGNOSIS — Z992 Dependence on renal dialysis: Secondary | ICD-10-CM | POA: Diagnosis not present

## 2023-03-23 DIAGNOSIS — D689 Coagulation defect, unspecified: Secondary | ICD-10-CM | POA: Diagnosis not present

## 2023-03-25 DIAGNOSIS — N2581 Secondary hyperparathyroidism of renal origin: Secondary | ICD-10-CM | POA: Diagnosis not present

## 2023-03-25 DIAGNOSIS — D689 Coagulation defect, unspecified: Secondary | ICD-10-CM | POA: Diagnosis not present

## 2023-03-25 DIAGNOSIS — E039 Hypothyroidism, unspecified: Secondary | ICD-10-CM | POA: Diagnosis not present

## 2023-03-25 DIAGNOSIS — Z992 Dependence on renal dialysis: Secondary | ICD-10-CM | POA: Diagnosis not present

## 2023-03-25 DIAGNOSIS — E876 Hypokalemia: Secondary | ICD-10-CM | POA: Diagnosis not present

## 2023-03-25 DIAGNOSIS — E1129 Type 2 diabetes mellitus with other diabetic kidney complication: Secondary | ICD-10-CM | POA: Diagnosis not present

## 2023-03-25 DIAGNOSIS — N186 End stage renal disease: Secondary | ICD-10-CM | POA: Diagnosis not present

## 2023-03-28 DIAGNOSIS — Z992 Dependence on renal dialysis: Secondary | ICD-10-CM | POA: Diagnosis not present

## 2023-03-28 DIAGNOSIS — E1129 Type 2 diabetes mellitus with other diabetic kidney complication: Secondary | ICD-10-CM | POA: Diagnosis not present

## 2023-03-28 DIAGNOSIS — E876 Hypokalemia: Secondary | ICD-10-CM | POA: Diagnosis not present

## 2023-03-28 DIAGNOSIS — N186 End stage renal disease: Secondary | ICD-10-CM | POA: Diagnosis not present

## 2023-03-28 DIAGNOSIS — D689 Coagulation defect, unspecified: Secondary | ICD-10-CM | POA: Diagnosis not present

## 2023-03-28 DIAGNOSIS — E039 Hypothyroidism, unspecified: Secondary | ICD-10-CM | POA: Diagnosis not present

## 2023-03-28 DIAGNOSIS — N2581 Secondary hyperparathyroidism of renal origin: Secondary | ICD-10-CM | POA: Diagnosis not present

## 2023-03-30 DIAGNOSIS — E039 Hypothyroidism, unspecified: Secondary | ICD-10-CM | POA: Diagnosis not present

## 2023-03-30 DIAGNOSIS — D689 Coagulation defect, unspecified: Secondary | ICD-10-CM | POA: Diagnosis not present

## 2023-03-30 DIAGNOSIS — E1129 Type 2 diabetes mellitus with other diabetic kidney complication: Secondary | ICD-10-CM | POA: Diagnosis not present

## 2023-03-30 DIAGNOSIS — Z992 Dependence on renal dialysis: Secondary | ICD-10-CM | POA: Diagnosis not present

## 2023-03-30 DIAGNOSIS — N186 End stage renal disease: Secondary | ICD-10-CM | POA: Diagnosis not present

## 2023-03-30 DIAGNOSIS — E876 Hypokalemia: Secondary | ICD-10-CM | POA: Diagnosis not present

## 2023-03-30 DIAGNOSIS — N2581 Secondary hyperparathyroidism of renal origin: Secondary | ICD-10-CM | POA: Diagnosis not present

## 2023-04-01 DIAGNOSIS — D689 Coagulation defect, unspecified: Secondary | ICD-10-CM | POA: Diagnosis not present

## 2023-04-01 DIAGNOSIS — E039 Hypothyroidism, unspecified: Secondary | ICD-10-CM | POA: Diagnosis not present

## 2023-04-01 DIAGNOSIS — N186 End stage renal disease: Secondary | ICD-10-CM | POA: Diagnosis not present

## 2023-04-01 DIAGNOSIS — Z992 Dependence on renal dialysis: Secondary | ICD-10-CM | POA: Diagnosis not present

## 2023-04-01 DIAGNOSIS — N2581 Secondary hyperparathyroidism of renal origin: Secondary | ICD-10-CM | POA: Diagnosis not present

## 2023-04-01 DIAGNOSIS — E876 Hypokalemia: Secondary | ICD-10-CM | POA: Diagnosis not present

## 2023-04-01 DIAGNOSIS — E1129 Type 2 diabetes mellitus with other diabetic kidney complication: Secondary | ICD-10-CM | POA: Diagnosis not present

## 2023-04-04 DIAGNOSIS — E039 Hypothyroidism, unspecified: Secondary | ICD-10-CM | POA: Diagnosis not present

## 2023-04-04 DIAGNOSIS — E876 Hypokalemia: Secondary | ICD-10-CM | POA: Diagnosis not present

## 2023-04-04 DIAGNOSIS — E1129 Type 2 diabetes mellitus with other diabetic kidney complication: Secondary | ICD-10-CM | POA: Diagnosis not present

## 2023-04-04 DIAGNOSIS — N2581 Secondary hyperparathyroidism of renal origin: Secondary | ICD-10-CM | POA: Diagnosis not present

## 2023-04-04 DIAGNOSIS — Z992 Dependence on renal dialysis: Secondary | ICD-10-CM | POA: Diagnosis not present

## 2023-04-04 DIAGNOSIS — D689 Coagulation defect, unspecified: Secondary | ICD-10-CM | POA: Diagnosis not present

## 2023-04-04 DIAGNOSIS — N186 End stage renal disease: Secondary | ICD-10-CM | POA: Diagnosis not present

## 2023-04-06 DIAGNOSIS — N186 End stage renal disease: Secondary | ICD-10-CM | POA: Diagnosis not present

## 2023-04-06 DIAGNOSIS — N2581 Secondary hyperparathyroidism of renal origin: Secondary | ICD-10-CM | POA: Diagnosis not present

## 2023-04-06 DIAGNOSIS — D689 Coagulation defect, unspecified: Secondary | ICD-10-CM | POA: Diagnosis not present

## 2023-04-06 DIAGNOSIS — E1129 Type 2 diabetes mellitus with other diabetic kidney complication: Secondary | ICD-10-CM | POA: Diagnosis not present

## 2023-04-06 DIAGNOSIS — Z992 Dependence on renal dialysis: Secondary | ICD-10-CM | POA: Diagnosis not present

## 2023-04-06 DIAGNOSIS — E876 Hypokalemia: Secondary | ICD-10-CM | POA: Diagnosis not present

## 2023-04-06 DIAGNOSIS — E039 Hypothyroidism, unspecified: Secondary | ICD-10-CM | POA: Diagnosis not present

## 2023-04-08 DIAGNOSIS — Z992 Dependence on renal dialysis: Secondary | ICD-10-CM | POA: Diagnosis not present

## 2023-04-08 DIAGNOSIS — E039 Hypothyroidism, unspecified: Secondary | ICD-10-CM | POA: Diagnosis not present

## 2023-04-08 DIAGNOSIS — E876 Hypokalemia: Secondary | ICD-10-CM | POA: Diagnosis not present

## 2023-04-08 DIAGNOSIS — E1129 Type 2 diabetes mellitus with other diabetic kidney complication: Secondary | ICD-10-CM | POA: Diagnosis not present

## 2023-04-08 DIAGNOSIS — D689 Coagulation defect, unspecified: Secondary | ICD-10-CM | POA: Diagnosis not present

## 2023-04-08 DIAGNOSIS — N186 End stage renal disease: Secondary | ICD-10-CM | POA: Diagnosis not present

## 2023-04-08 DIAGNOSIS — N2581 Secondary hyperparathyroidism of renal origin: Secondary | ICD-10-CM | POA: Diagnosis not present

## 2023-04-10 ENCOUNTER — Telehealth: Payer: Self-pay | Admitting: Internal Medicine

## 2023-04-10 NOTE — Telephone Encounter (Signed)
Patient states he is completely out of stiolto inhaler. Patient states he is completely out and having difficulty breathing. Patient has not been seen in little over a year. Appointment scheduled for 9/16. Could he possibly get a courtesy refill until his appointment with Dr. Sherene Sires?   Please advise and call patient back.   Pharmacy: [facility].   [phone] good number for a call back. Patient will dialysis and will be free after noon.

## 2023-04-11 DIAGNOSIS — Z992 Dependence on renal dialysis: Secondary | ICD-10-CM | POA: Diagnosis not present

## 2023-04-11 DIAGNOSIS — E876 Hypokalemia: Secondary | ICD-10-CM | POA: Diagnosis not present

## 2023-04-11 DIAGNOSIS — E039 Hypothyroidism, unspecified: Secondary | ICD-10-CM | POA: Diagnosis not present

## 2023-04-11 DIAGNOSIS — D689 Coagulation defect, unspecified: Secondary | ICD-10-CM | POA: Diagnosis not present

## 2023-04-11 DIAGNOSIS — E1129 Type 2 diabetes mellitus with other diabetic kidney complication: Secondary | ICD-10-CM | POA: Diagnosis not present

## 2023-04-11 DIAGNOSIS — N2581 Secondary hyperparathyroidism of renal origin: Secondary | ICD-10-CM | POA: Diagnosis not present

## 2023-04-11 DIAGNOSIS — N186 End stage renal disease: Secondary | ICD-10-CM | POA: Diagnosis not present

## 2023-04-11 MED ORDER — STIOLTO RESPIMAT 2.5-2.5 MCG/ACT IN AERS: 2.0000 | INHALATION_SPRAY | Freq: Every morning | RESPIRATORY_TRACT | 0 refills | Status: DC

## 2023-04-11 NOTE — Telephone Encounter (Signed)
Rx sent to pharmacy   

## 2023-04-13 DIAGNOSIS — N2581 Secondary hyperparathyroidism of renal origin: Secondary | ICD-10-CM | POA: Diagnosis not present

## 2023-04-13 DIAGNOSIS — D689 Coagulation defect, unspecified: Secondary | ICD-10-CM | POA: Diagnosis not present

## 2023-04-13 DIAGNOSIS — E039 Hypothyroidism, unspecified: Secondary | ICD-10-CM | POA: Diagnosis not present

## 2023-04-13 DIAGNOSIS — Z992 Dependence on renal dialysis: Secondary | ICD-10-CM | POA: Diagnosis not present

## 2023-04-13 DIAGNOSIS — N186 End stage renal disease: Secondary | ICD-10-CM | POA: Diagnosis not present

## 2023-04-13 DIAGNOSIS — E1129 Type 2 diabetes mellitus with other diabetic kidney complication: Secondary | ICD-10-CM | POA: Diagnosis not present

## 2023-04-13 DIAGNOSIS — E876 Hypokalemia: Secondary | ICD-10-CM | POA: Diagnosis not present

## 2023-04-15 DIAGNOSIS — D689 Coagulation defect, unspecified: Secondary | ICD-10-CM | POA: Diagnosis not present

## 2023-04-15 DIAGNOSIS — N186 End stage renal disease: Secondary | ICD-10-CM | POA: Diagnosis not present

## 2023-04-15 DIAGNOSIS — N2581 Secondary hyperparathyroidism of renal origin: Secondary | ICD-10-CM | POA: Diagnosis not present

## 2023-04-15 DIAGNOSIS — E1129 Type 2 diabetes mellitus with other diabetic kidney complication: Secondary | ICD-10-CM | POA: Diagnosis not present

## 2023-04-15 DIAGNOSIS — E039 Hypothyroidism, unspecified: Secondary | ICD-10-CM | POA: Diagnosis not present

## 2023-04-15 DIAGNOSIS — E876 Hypokalemia: Secondary | ICD-10-CM | POA: Diagnosis not present

## 2023-04-15 DIAGNOSIS — Z992 Dependence on renal dialysis: Secondary | ICD-10-CM | POA: Diagnosis not present

## 2023-04-18 DIAGNOSIS — N186 End stage renal disease: Secondary | ICD-10-CM | POA: Diagnosis not present

## 2023-04-18 DIAGNOSIS — Z992 Dependence on renal dialysis: Secondary | ICD-10-CM | POA: Diagnosis not present

## 2023-04-18 DIAGNOSIS — D509 Iron deficiency anemia, unspecified: Secondary | ICD-10-CM | POA: Diagnosis not present

## 2023-04-18 DIAGNOSIS — E1129 Type 2 diabetes mellitus with other diabetic kidney complication: Secondary | ICD-10-CM | POA: Diagnosis not present

## 2023-04-18 DIAGNOSIS — D689 Coagulation defect, unspecified: Secondary | ICD-10-CM | POA: Diagnosis not present

## 2023-04-18 DIAGNOSIS — N2581 Secondary hyperparathyroidism of renal origin: Secondary | ICD-10-CM | POA: Diagnosis not present

## 2023-04-18 DIAGNOSIS — D631 Anemia in chronic kidney disease: Secondary | ICD-10-CM | POA: Diagnosis not present

## 2023-04-20 DIAGNOSIS — D509 Iron deficiency anemia, unspecified: Secondary | ICD-10-CM | POA: Diagnosis not present

## 2023-04-20 DIAGNOSIS — D689 Coagulation defect, unspecified: Secondary | ICD-10-CM | POA: Diagnosis not present

## 2023-04-20 DIAGNOSIS — N2581 Secondary hyperparathyroidism of renal origin: Secondary | ICD-10-CM | POA: Diagnosis not present

## 2023-04-20 DIAGNOSIS — E1129 Type 2 diabetes mellitus with other diabetic kidney complication: Secondary | ICD-10-CM | POA: Diagnosis not present

## 2023-04-20 DIAGNOSIS — N186 End stage renal disease: Secondary | ICD-10-CM | POA: Diagnosis not present

## 2023-04-20 DIAGNOSIS — Z992 Dependence on renal dialysis: Secondary | ICD-10-CM | POA: Diagnosis not present

## 2023-04-20 DIAGNOSIS — D631 Anemia in chronic kidney disease: Secondary | ICD-10-CM | POA: Diagnosis not present

## 2023-04-22 DIAGNOSIS — N186 End stage renal disease: Secondary | ICD-10-CM | POA: Diagnosis not present

## 2023-04-22 DIAGNOSIS — D689 Coagulation defect, unspecified: Secondary | ICD-10-CM | POA: Diagnosis not present

## 2023-04-22 DIAGNOSIS — Z992 Dependence on renal dialysis: Secondary | ICD-10-CM | POA: Diagnosis not present

## 2023-04-22 DIAGNOSIS — D631 Anemia in chronic kidney disease: Secondary | ICD-10-CM | POA: Diagnosis not present

## 2023-04-22 DIAGNOSIS — D509 Iron deficiency anemia, unspecified: Secondary | ICD-10-CM | POA: Diagnosis not present

## 2023-04-22 DIAGNOSIS — E1129 Type 2 diabetes mellitus with other diabetic kidney complication: Secondary | ICD-10-CM | POA: Diagnosis not present

## 2023-04-22 DIAGNOSIS — N2581 Secondary hyperparathyroidism of renal origin: Secondary | ICD-10-CM | POA: Diagnosis not present

## 2023-04-25 DIAGNOSIS — E1129 Type 2 diabetes mellitus with other diabetic kidney complication: Secondary | ICD-10-CM | POA: Diagnosis not present

## 2023-04-25 DIAGNOSIS — D509 Iron deficiency anemia, unspecified: Secondary | ICD-10-CM | POA: Diagnosis not present

## 2023-04-25 DIAGNOSIS — N2581 Secondary hyperparathyroidism of renal origin: Secondary | ICD-10-CM | POA: Diagnosis not present

## 2023-04-25 DIAGNOSIS — Z992 Dependence on renal dialysis: Secondary | ICD-10-CM | POA: Diagnosis not present

## 2023-04-25 DIAGNOSIS — N186 End stage renal disease: Secondary | ICD-10-CM | POA: Diagnosis not present

## 2023-04-25 DIAGNOSIS — D689 Coagulation defect, unspecified: Secondary | ICD-10-CM | POA: Diagnosis not present

## 2023-04-25 DIAGNOSIS — D631 Anemia in chronic kidney disease: Secondary | ICD-10-CM | POA: Diagnosis not present

## 2023-04-27 DIAGNOSIS — N186 End stage renal disease: Secondary | ICD-10-CM | POA: Diagnosis not present

## 2023-04-27 DIAGNOSIS — D631 Anemia in chronic kidney disease: Secondary | ICD-10-CM | POA: Diagnosis not present

## 2023-04-27 DIAGNOSIS — N2581 Secondary hyperparathyroidism of renal origin: Secondary | ICD-10-CM | POA: Diagnosis not present

## 2023-04-27 DIAGNOSIS — D689 Coagulation defect, unspecified: Secondary | ICD-10-CM | POA: Diagnosis not present

## 2023-04-27 DIAGNOSIS — E1129 Type 2 diabetes mellitus with other diabetic kidney complication: Secondary | ICD-10-CM | POA: Diagnosis not present

## 2023-04-27 DIAGNOSIS — Z992 Dependence on renal dialysis: Secondary | ICD-10-CM | POA: Diagnosis not present

## 2023-04-27 DIAGNOSIS — D509 Iron deficiency anemia, unspecified: Secondary | ICD-10-CM | POA: Diagnosis not present

## 2023-04-29 DIAGNOSIS — D509 Iron deficiency anemia, unspecified: Secondary | ICD-10-CM | POA: Diagnosis not present

## 2023-04-29 DIAGNOSIS — N2581 Secondary hyperparathyroidism of renal origin: Secondary | ICD-10-CM | POA: Diagnosis not present

## 2023-04-29 DIAGNOSIS — D689 Coagulation defect, unspecified: Secondary | ICD-10-CM | POA: Diagnosis not present

## 2023-04-29 DIAGNOSIS — Z992 Dependence on renal dialysis: Secondary | ICD-10-CM | POA: Diagnosis not present

## 2023-04-29 DIAGNOSIS — E1129 Type 2 diabetes mellitus with other diabetic kidney complication: Secondary | ICD-10-CM | POA: Diagnosis not present

## 2023-04-29 DIAGNOSIS — D631 Anemia in chronic kidney disease: Secondary | ICD-10-CM | POA: Diagnosis not present

## 2023-04-29 DIAGNOSIS — N186 End stage renal disease: Secondary | ICD-10-CM | POA: Diagnosis not present

## 2023-04-30 NOTE — Progress Notes (Deleted)
Charles Hart, male    DOB: [birth year], 67 y.o.   MRN: [id number]    Brief patient profile:  8 yobm never smoker   referred to pulmonary clinic in [place]  03/26/2021 by Dr    Charles Hart for cough abrupt onset early July 2022 assoc with sob noct but not with adls    History of Present Illness  03/26/2021  Pulmonary/ 1st office eval/ Charles Hart / [facility]  Chief Complaint  Patient presents with   Pulmonary Consult    Referred by Charles Hart. Pt c/o non prod cough with SOB x 1 month.    Dyspnea:  MMRC1 = can walk nl pace, flat grade, can't hurry or go uphills or steps s sob   Cough: dry less when asleep  Sleep: sob worse  when lie down - can't tell worse on nights when not HD SABA use: none  Using lots of mints/ menthols due to sense of pnds  Rec Pantoprazole (protonix) 40 mg   Take  30-60 min before first meal of the day and Pepcid (famotidine)  20 mg after supper until return to office -  GERD diet reviewed, bed blocks rec   Pay attention to how you do with your breathing when you lie down on Sat night  vs Sun vs Monday Please schedule a follow up office visit in 8 weeks, call sooner if needed with all medications /inhalers/ solutions in hand   Date of Admission: 05/17/2021  2:26 PM Date of Discharge: 05/20/2021  7:02 PM   Discharge Diagnosis: 1. Acute exacerbation of heart failure  2. Chronic systolic and diastolic Heart Failure 3. ESRD on Hemodialysis 4. Hypertension 5. Hypokalemia 6. Obstructive Sleep Apnea 7. Normocytic anemia   05/26/2021  f/u ov/[place] office/Charles Hart re: cough x July 2022/noct sob  maint on nothing  Chief Complaint  Patient presents with   Follow-up    Cough has improved. Admitted into hosp 10/3-10/10 for SOB.    Dyspnea:  MMRC1 = can walk nl pace, flat grade, can't hurry or go uphills or steps s sob   Cough: "not a bid deal" ,  clears throat some still s mucus production  Sleeping: flat bed/ 1-2 pillows  HD T - Th - Sat  SABA  use: none  02: none  Covid status: vax x 2  Rec Continue Pantoprazole (protonix) 40 mg   Take  30-60 min before first meal of the day and Pepcid (famotidine)  20 mg after supper until return to office   GERD diet/ bed blocks    03/31/2022  f/u ov/[place] office/Charles Hart re: CHF/ ESRF cough x  maint on no rx   Chief Complaint  Patient presents with   Follow-up    Pt states his cough is worse and it is now productive with white sputum.  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   Cough: min white / assoc with watery rhinitis since cough  Sleeping: 45 degrees  - any less says can't breath SABA use: has ventolin helps breathing and cough some esp p supper despite hfa nearly 0% effective technique 02: none  Covid status: vax x 2/ never infected  Rec Pantoprazole (protonix) 40 mg   Take  30-60 min before first meal of the day and Pepcid (famotidine)  20 mg after supper until return to  office GERD diet  Increase gabapentin to 300 mg one  twice daily  Prednisone 10 mg take  4 each am x 2 days,   2 each am x 2 days,  1 each am x 2 days and stop  Please remember to go to the  x-ray department > did not go  Please schedule a follow up office visit in 4 weeks, sooner if needed  with all medications /inhalers/ solutions in hand     06/10/2022  f/u ov/[place] office/Charles Hart re: CHF /ESRF on HD T-Th -SaT maint on stiolto but never filled rx and not sure helped or 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 it Financial controller Complaint  Patient presents with   Follow-up    Breathing doing better    Dyspnea:  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   Dyspnea:  *** Cough: *** Sleeping: ***   resp cc  SABA use: *** 02: ***  Lung cancer screening: ***   No obvious day to day or daytime variability or assoc excess/ purulent sputum or mucus plugs or hemoptysis or cp or chest tightness, subjective wheeze or overt sinus or hb symptoms.    Also denies any obvious fluctuation of symptoms with weather or environmental changes or other aggravating or alleviating factors except as outlined above   No unusual exposure hx or h/o childhood pna/ asthma or knowledge of premature birth.  Current Allergies, Complete Past Medical History, Past Surgical History, Family History, and Social History were reviewed in [employer] record.  ROS  The following are not active complaints unless bolded Hoarseness, sore throat, dysphagia, dental problems, itching, sneezing,  nasal congestion or discharge of excess mucus or purulent secretions, ear ache,   fever, chills, sweats, unintended wt loss or wt gain, classically pleuritic or exertional cp,  orthopnea pnd or arm/hand swelling  or leg swelling, presyncope, palpitations, abdominal pain, anorexia, nausea, vomiting, diarrhea  or change in bowel habits or change in bladder habits, change in stools or change in urine, dysuria, hematuria,  rash, arthralgias, visual complaints, headache, numbness, weakness or ataxia or problems with walking or coordination,  change in mood or  memory.        No outpatient medications have been marked as taking for the 05/01/23 encounter (Appointment) with Charles Cowden, MD.                   Past Medical History:  Diagnosis Date   Adenomatous colon polyp 07/02/2011   Last colonoscopy May 06, 2011 by Dr. Rob Hart, who recommended repeat colonoscopy in 5 years.    Anemia    Background diabetic retinopathy 04/20/2012   Patient is followed by Charles Hart    Cancer [facility])    Kidney   Cardiomyopathy    LV function improved from 2004 to 2008.  Historically, moderately dilated LV with  EF 30-40% by 2D echo 08/14/2002.  Mild CAD with severe LV dysfunction by cardiac cath 09/2002.  Normal coronary arteries and normal LV function by cardiac cath 09/19/2006.  A 2-D echo on 04/01/2009 showed mild concentric hypertrophy and normal systolic (LVEF  60-65%) and doppler C/W with grade 1 diastolic dysfunction.   CHF (congestive heart failure) (HCC)    LV function improved from 2004 to 2008.  Historically, moderately dilated LV with EF 30-40% by 2D echo 08/14/2002.  Mild CAD with severe LV dysfunction by cardiac cath 09/2002.  Normal coronary arteries and normal LV function by cardiac cath 09/19/2006.  A 2-D echo on 04/01/2009 showed mild concentric hypertrophy and normal systolic (LVEF  60-65%) and doppler C/W with grade 1 diastolic dysfunction..    Chronic combined systolic and diastolic congestive heart failure (HCC) 05/21/2010   LV function improved from 2004 to 2008.  Historically, moderately dilated LV with EF 30-40% by 2D echo 08/14/2002.  Mild CAD with severe LV dysfunction by cardiac cath 09/2002.  Normal coronary arteries and normal LV function by cardiac cath 09/19/2006.  A 2-D echo on 04/01/2009 showed mild concentric hypertrophy and normal systolic (LVEF  60-65%) and doppler parameters consistent with abnormal left    CVA (cerebral vascular accident) (HCC) 07/04/2012   MRI of the brain 07/04/2012 showed an acute infarct in the right basal ganglia involving the anterior putamen, anterior limb internal capsule, and head of the caudate; this measured approximately 2.5 cm in diameter.      Dermatitis    Diabetes mellitus    type 2   DIABETIC PERIPHERAL NEUROPATHY 08/03/2007   Qualifier: Diagnosis of   By: Meredith Pel MD, Dorene Sorrow     DM neuropathy, painful (HCC)    fingers and right knee   ESRD (end stage renal disease) (HCC)    Stage 4, on hemodailysis x 4 months as of 05-18-18 [place] [facility], tues thurs sat   ESRD (end stage renal disease) on dialysis (HCC)    "TTS; [facility]" (05/31/2018)   Hearing loss in right ear    Hyperlipidemia    Hypertension    Hypertensive crisis 07/28/2012   Hypertensive urgency 08/20/2014   Myocardial infarction [facility])  many yrs ago   Nephrotic syndrome 02/18/2013   A 24-hour urine collection 03/04/2013 showed total protein of 5,460 g and creatinine clearance of 80 mL/minute.  Patient was seen by Loma Sender at [facility] and a repeat 24-hour urine showed 10,407 mg protein.  Patient underwent kidney biopsy on 05/30/2013; pathology showed advanced diffuse and nodular diabetic nephropathy with vascular changes consistent with long-standing difficult to control hypertension.      Pneumonia 2014 and 2015   Renal insufficiency    Sleep apnea    11/06/20 - hasn't used it "in a while"        Objective:    Wts  05/01/2023        ***  06/10/2022     190   03/31/2022       176   05/26/21 200 lb 1.9 oz (90.8 kg)  05/20/21 194 lb 7.1 oz (88.2 kg)  05/06/21 205 lb 14.4 oz (93.4 kg)    Vital signs reviewed  05/01/2023  - Note at rest 02 sats  ***% on ***   General appearance:    ***               Assessment

## 2023-05-01 ENCOUNTER — Encounter: Payer: Self-pay | Admitting: Internal Medicine

## 2023-05-01 ENCOUNTER — Ambulatory Visit: Payer: 59 | Admitting: Internal Medicine

## 2023-05-02 DIAGNOSIS — Z992 Dependence on renal dialysis: Secondary | ICD-10-CM | POA: Diagnosis not present

## 2023-05-02 DIAGNOSIS — E1129 Type 2 diabetes mellitus with other diabetic kidney complication: Secondary | ICD-10-CM | POA: Diagnosis not present

## 2023-05-02 DIAGNOSIS — D689 Coagulation defect, unspecified: Secondary | ICD-10-CM | POA: Diagnosis not present

## 2023-05-02 DIAGNOSIS — N2581 Secondary hyperparathyroidism of renal origin: Secondary | ICD-10-CM | POA: Diagnosis not present

## 2023-05-02 DIAGNOSIS — N186 End stage renal disease: Secondary | ICD-10-CM | POA: Diagnosis not present

## 2023-05-02 DIAGNOSIS — D631 Anemia in chronic kidney disease: Secondary | ICD-10-CM | POA: Diagnosis not present

## 2023-05-02 DIAGNOSIS — D509 Iron deficiency anemia, unspecified: Secondary | ICD-10-CM | POA: Diagnosis not present

## 2023-05-03 ENCOUNTER — Telehealth: Payer: Self-pay | Admitting: Internal Medicine

## 2023-05-03 NOTE — Telephone Encounter (Signed)
Called to discuss rescheduling the 05/01/23 missed appointment with Dr. Chriss Driver could not be completed

## 2023-05-04 DIAGNOSIS — N2581 Secondary hyperparathyroidism of renal origin: Secondary | ICD-10-CM | POA: Diagnosis not present

## 2023-05-04 DIAGNOSIS — D509 Iron deficiency anemia, unspecified: Secondary | ICD-10-CM | POA: Diagnosis not present

## 2023-05-04 DIAGNOSIS — D689 Coagulation defect, unspecified: Secondary | ICD-10-CM | POA: Diagnosis not present

## 2023-05-04 DIAGNOSIS — E1129 Type 2 diabetes mellitus with other diabetic kidney complication: Secondary | ICD-10-CM | POA: Diagnosis not present

## 2023-05-04 DIAGNOSIS — N186 End stage renal disease: Secondary | ICD-10-CM | POA: Diagnosis not present

## 2023-05-04 DIAGNOSIS — Z992 Dependence on renal dialysis: Secondary | ICD-10-CM | POA: Diagnosis not present

## 2023-05-04 DIAGNOSIS — D631 Anemia in chronic kidney disease: Secondary | ICD-10-CM | POA: Diagnosis not present

## 2023-05-06 DIAGNOSIS — E1129 Type 2 diabetes mellitus with other diabetic kidney complication: Secondary | ICD-10-CM | POA: Diagnosis not present

## 2023-05-06 DIAGNOSIS — D631 Anemia in chronic kidney disease: Secondary | ICD-10-CM | POA: Diagnosis not present

## 2023-05-06 DIAGNOSIS — D689 Coagulation defect, unspecified: Secondary | ICD-10-CM | POA: Diagnosis not present

## 2023-05-06 DIAGNOSIS — N2581 Secondary hyperparathyroidism of renal origin: Secondary | ICD-10-CM | POA: Diagnosis not present

## 2023-05-06 DIAGNOSIS — Z992 Dependence on renal dialysis: Secondary | ICD-10-CM | POA: Diagnosis not present

## 2023-05-06 DIAGNOSIS — D509 Iron deficiency anemia, unspecified: Secondary | ICD-10-CM | POA: Diagnosis not present

## 2023-05-06 DIAGNOSIS — N186 End stage renal disease: Secondary | ICD-10-CM | POA: Diagnosis not present

## 2023-05-08 ENCOUNTER — Other Ambulatory Visit: Payer: Self-pay | Admitting: Internal Medicine

## 2023-05-09 DIAGNOSIS — Z992 Dependence on renal dialysis: Secondary | ICD-10-CM | POA: Diagnosis not present

## 2023-05-09 DIAGNOSIS — D689 Coagulation defect, unspecified: Secondary | ICD-10-CM | POA: Diagnosis not present

## 2023-05-09 DIAGNOSIS — N186 End stage renal disease: Secondary | ICD-10-CM | POA: Diagnosis not present

## 2023-05-09 DIAGNOSIS — E1129 Type 2 diabetes mellitus with other diabetic kidney complication: Secondary | ICD-10-CM | POA: Diagnosis not present

## 2023-05-09 DIAGNOSIS — D509 Iron deficiency anemia, unspecified: Secondary | ICD-10-CM | POA: Diagnosis not present

## 2023-05-09 DIAGNOSIS — D631 Anemia in chronic kidney disease: Secondary | ICD-10-CM | POA: Diagnosis not present

## 2023-05-09 DIAGNOSIS — N2581 Secondary hyperparathyroidism of renal origin: Secondary | ICD-10-CM | POA: Diagnosis not present

## 2023-05-11 DIAGNOSIS — D689 Coagulation defect, unspecified: Secondary | ICD-10-CM | POA: Diagnosis not present

## 2023-05-11 DIAGNOSIS — Z992 Dependence on renal dialysis: Secondary | ICD-10-CM | POA: Diagnosis not present

## 2023-05-11 DIAGNOSIS — N186 End stage renal disease: Secondary | ICD-10-CM | POA: Diagnosis not present

## 2023-05-11 DIAGNOSIS — D631 Anemia in chronic kidney disease: Secondary | ICD-10-CM | POA: Diagnosis not present

## 2023-05-11 DIAGNOSIS — N2581 Secondary hyperparathyroidism of renal origin: Secondary | ICD-10-CM | POA: Diagnosis not present

## 2023-05-11 DIAGNOSIS — E1129 Type 2 diabetes mellitus with other diabetic kidney complication: Secondary | ICD-10-CM | POA: Diagnosis not present

## 2023-05-11 DIAGNOSIS — G4733 Obstructive sleep apnea (adult) (pediatric): Secondary | ICD-10-CM | POA: Diagnosis not present

## 2023-05-11 DIAGNOSIS — D509 Iron deficiency anemia, unspecified: Secondary | ICD-10-CM | POA: Diagnosis not present

## 2023-05-13 DIAGNOSIS — D631 Anemia in chronic kidney disease: Secondary | ICD-10-CM | POA: Diagnosis not present

## 2023-05-13 DIAGNOSIS — N2581 Secondary hyperparathyroidism of renal origin: Secondary | ICD-10-CM | POA: Diagnosis not present

## 2023-05-13 DIAGNOSIS — N186 End stage renal disease: Secondary | ICD-10-CM | POA: Diagnosis not present

## 2023-05-13 DIAGNOSIS — E1129 Type 2 diabetes mellitus with other diabetic kidney complication: Secondary | ICD-10-CM | POA: Diagnosis not present

## 2023-05-13 DIAGNOSIS — Z992 Dependence on renal dialysis: Secondary | ICD-10-CM | POA: Diagnosis not present

## 2023-05-13 DIAGNOSIS — D689 Coagulation defect, unspecified: Secondary | ICD-10-CM | POA: Diagnosis not present

## 2023-05-13 DIAGNOSIS — D509 Iron deficiency anemia, unspecified: Secondary | ICD-10-CM | POA: Diagnosis not present

## 2023-05-15 DIAGNOSIS — E1129 Type 2 diabetes mellitus with other diabetic kidney complication: Secondary | ICD-10-CM | POA: Diagnosis not present

## 2023-05-15 DIAGNOSIS — N186 End stage renal disease: Secondary | ICD-10-CM | POA: Diagnosis not present

## 2023-05-15 DIAGNOSIS — Z992 Dependence on renal dialysis: Secondary | ICD-10-CM | POA: Diagnosis not present

## 2023-05-16 DIAGNOSIS — D631 Anemia in chronic kidney disease: Secondary | ICD-10-CM | POA: Diagnosis not present

## 2023-05-16 DIAGNOSIS — D509 Iron deficiency anemia, unspecified: Secondary | ICD-10-CM | POA: Diagnosis not present

## 2023-05-16 DIAGNOSIS — E1129 Type 2 diabetes mellitus with other diabetic kidney complication: Secondary | ICD-10-CM | POA: Diagnosis not present

## 2023-05-16 DIAGNOSIS — N186 End stage renal disease: Secondary | ICD-10-CM | POA: Diagnosis not present

## 2023-05-16 DIAGNOSIS — Z992 Dependence on renal dialysis: Secondary | ICD-10-CM | POA: Diagnosis not present

## 2023-05-16 DIAGNOSIS — D689 Coagulation defect, unspecified: Secondary | ICD-10-CM | POA: Diagnosis not present

## 2023-05-16 DIAGNOSIS — N2581 Secondary hyperparathyroidism of renal origin: Secondary | ICD-10-CM | POA: Diagnosis not present

## 2023-05-18 DIAGNOSIS — E1129 Type 2 diabetes mellitus with other diabetic kidney complication: Secondary | ICD-10-CM | POA: Diagnosis not present

## 2023-05-18 DIAGNOSIS — D631 Anemia in chronic kidney disease: Secondary | ICD-10-CM | POA: Diagnosis not present

## 2023-05-18 DIAGNOSIS — Z992 Dependence on renal dialysis: Secondary | ICD-10-CM | POA: Diagnosis not present

## 2023-05-18 DIAGNOSIS — D689 Coagulation defect, unspecified: Secondary | ICD-10-CM | POA: Diagnosis not present

## 2023-05-18 DIAGNOSIS — N186 End stage renal disease: Secondary | ICD-10-CM | POA: Diagnosis not present

## 2023-05-18 DIAGNOSIS — D509 Iron deficiency anemia, unspecified: Secondary | ICD-10-CM | POA: Diagnosis not present

## 2023-05-18 DIAGNOSIS — N2581 Secondary hyperparathyroidism of renal origin: Secondary | ICD-10-CM | POA: Diagnosis not present

## 2023-05-20 DIAGNOSIS — D631 Anemia in chronic kidney disease: Secondary | ICD-10-CM | POA: Diagnosis not present

## 2023-05-20 DIAGNOSIS — Z992 Dependence on renal dialysis: Secondary | ICD-10-CM | POA: Diagnosis not present

## 2023-05-20 DIAGNOSIS — N186 End stage renal disease: Secondary | ICD-10-CM | POA: Diagnosis not present

## 2023-05-20 DIAGNOSIS — D509 Iron deficiency anemia, unspecified: Secondary | ICD-10-CM | POA: Diagnosis not present

## 2023-05-20 DIAGNOSIS — N2581 Secondary hyperparathyroidism of renal origin: Secondary | ICD-10-CM | POA: Diagnosis not present

## 2023-05-20 DIAGNOSIS — E1129 Type 2 diabetes mellitus with other diabetic kidney complication: Secondary | ICD-10-CM | POA: Diagnosis not present

## 2023-05-20 DIAGNOSIS — D689 Coagulation defect, unspecified: Secondary | ICD-10-CM | POA: Diagnosis not present

## 2023-05-21 NOTE — Progress Notes (Unsigned)
Charles Hart, male    DOB: Nov 10, 1955    MRN: [id number]    Brief patient profile:  19 yobm never smoker   referred to pulmonary clinic in [place]  03/26/2021 by Dr    [name] for cough abrupt onset early July 2022 assoc with sob noct but not with adls    History of Present Illness  03/26/2021  Pulmonary/ 1st office eval/ [name] / [facility] Office  Chief Complaint  Patient presents with   Pulmonary Consult    Referred by Dr. [name]. Pt c/o non prod cough with SOB x 1 month.    Dyspnea:  MMRC1 = can walk nl pace, flat grade, can't hurry or go uphills or steps s sob   Cough: dry less when asleep  Sleep: sob worse  when lie down - can't tell worse on nights when not HD SABA use: none  Using lots of mints/ menthols due to sense of pnds  Rec Pantoprazole (protonix) 40 mg   Take  30-60 min before first meal of the day and Pepcid (famotidine)  20 mg after supper until return to office -  GERD diet reviewed, bed blocks rec  Pay attention to how you do with your breathing when you lie down on Sat night  vs Sun vs Monday Please schedule a follow up office visit in 8 weeks, call sooner if needed with all medications /inhalers/ solutions in hand   Date of Admission: 05/17/2021  2:26 PM Date of Discharge: 05/20/2021  7:02 PM   Discharge Diagnosis: 1. Acute exacerbation of heart failure  2. Chronic systolic and diastolic Heart Failure 3. ESRD on Hemodialysis 4. Hypertension 5. Hypokalemia 6. Obstructive Sleep Apnea 7. Normocytic anemia   05/26/2021  f/u ov/[name] office/[name] re: cough x July 2022/noct sob  maint on nothing  Chief Complaint  Patient presents with   Follow-up    Cough has improved. Admitted into hosp 10/3-10/10 for SOB.    Dyspnea:  MMRC1 = can walk nl pace, flat grade, can't hurry or go uphills or steps s sob   Cough: "not a bid deal" ,  clears throat some still s mucus production  Sleeping: flat bed/ 1-2 pillows  HD T - Th - Sat  SABA use:  none  02: none  Covid status: vax x 2  Rec Continue Pantoprazole (protonix) 40 mg   Take  30-60 min before first meal of the day and Pepcid (famotidine)  20 mg after supper until return to office   GERD diet/ bed blocks    03/31/2022  f/u ov/[name] [facility]/[name] re: CHF/ ESRF cough x  maint on no rx   Chief Complaint  Patient presents with   Follow-up    Pt states his cough is worse and it is now productive with white sputum.  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   Cough: min white / assoc with watery rhinitis since cough  Sleeping: 45 degrees  - any less says can't breath SABA use: has ventolin helps breathing and cough some esp p supper despite hfa nearly 0% effective technique 02: none  Covid status: vax x 2/ never infected  Rec Pantoprazole (protonix) 40 mg   Take  30-60 min before first meal of the day and Pepcid (famotidine)  20 mg after supper until return to office GERD  diet  Increase gabapentin to 300 mg one  twice daily  Prednisone 10 mg take  4 each am x 2 days,   2 each am x 2 days,  1 each am x 2 days and stop  Please remember to go to the  x-ray department > did not go  Please schedule a follow up office visit in 4 weeks, sooner if needed  with all medications /inhalers/ solutions in hand     06/10/2022  f/u ov/[place] [facility]/[name] re: CHF /ESRF on HD T-Th -SaT maint on stiolto but never filled rx and not sure helped or ever took it Financial controller Complaint  Patient presents with   Follow-up    Breathing doing better    Dyspnea:  still able to do job ok pulling carts until got hurt back in  aug 8th 2023  Cough: none  Sleeping: ok  at 45 degrees  SABA use: none  02: none  Rec Start back on stiolto 2 puffs each am x 2 weeks and then stop it to see what difference you notice and if there is none, don't fill the prescription My  office will be contacting you by phone for referral for pfts  > not done as of 05/22/2023   Pulmonary follow up will be if needed for respiratory problems   05/22/2023  f/u ov/[place] office/[name] re: cough/ doe  restarted  stiolto x month  / on prn gabapentin (really not taking it) nor gerd rx  Chief Complaint  Patient presents with   Upper airway cough syndrome   Dyspnea:  lowe's walking  x 4 aisles and has to stop due to sob  Cough: some dry no pattern  Sleeping: 30 degrees  waking 2- 3 times per week cough / sob   SABA use: none  02: prn during HD / no change on Monday nights vs any other    No obvious day to day or daytime variability or assoc excess/ purulent sputum or mucus plugs or hemoptysis or cp or chest tightness, subjective wheeze or overt sinus or hb symptoms.    Also denies any obvious fluctuation of symptoms with weather or environmental changes or other aggravating or alleviating factors except as outlined above   No unusual exposure hx or h/o childhood pna/ asthma or knowledge of premature birth.  Current Allergies, Complete Past Medical History, Past Surgical History, Family History, and Social History were reviewed in [employer] record.  ROS  The following are not active complaints unless bolded Hoarseness, sore throat/globus sensation  dysphagia, dental problems, itching, sneezing,  nasal congestion or discharge of excess mucus or purulent secretions, ear ache,   fever, chills, sweats, unintended wt loss or wt gain, classically pleuritic or exertional cp,  orthopnea pnd or arm/hand swelling  or leg swelling, presyncope, palpitations, abdominal pain, anorexia, nausea, vomiting, diarrhea  or change in bowel habits or change in bladder habits, change in stools or change in urine, dysuria, hematuria,  rash, arthralgias, visual complaints, headache, numbness, weakness or ataxia or problems with walking or coordination,  change in mood or  memory.         Current Meds  Medication Sig   acetaminophen (TYLENOL) 325 MG tablet Take 2 tablets (650 mg total) by mouth every 6 (six) hours as needed for mild pain.   albuterol (VENTOLIN HFA) 108 (90 Base) MCG/ACT inhaler Inhale 2 puffs into the lungs every 6 (six) hours as needed for wheezing or shortness of breath.  calcium acetate (PHOSLO) 667 MG capsule Take 2,001 mg by mouth 3 (three) times daily with meals.    carvedilol (COREG) 6.25 MG tablet Take 6.25 mg by mouth 2 (two) times daily.   fluticasone (FLONASE) 50 MCG/ACT nasal spray Place 2 sprays into both nostrils as needed for allergies.   folic acid-vitamin b complex-vitamin c-selenium-zinc (DIALYVITE) 3 MG TABS tablet Take 1 tablet by mouth daily.   furosemide (LASIX) 80 MG tablet Take 1 tablet (80 mg total) by mouth daily.   gabapentin (NEURONTIN) 300 MG capsule Take 1 capsule (300 mg total) by mouth daily.   hydrALAZINE (APRESOLINE) 100 MG tablet Take 1 tablet (100 mg total) by mouth 3 (three) times daily.   pantoprazole (PROTONIX) 40 MG tablet TAKE 1 TABLET(40 MG) BY MOUTH DAILY 30 TO 60 MINUTES BEFORE FIRST MEAL OF THE DAY   pravastatin (PRAVACHOL) 40 MG tablet Take 1 tablet (40 mg total) by mouth every evening.   Tiotropium Bromide-Olodaterol (STIOLTO RESPIMAT) 2.5-2.5 MCG/ACT AERS INHALE 2 PUFFS INTO THE LUNGS IN THE MORNING                   Past Medical History:  Diagnosis Date   Adenomatous colon polyp 07/02/2011   Last colonoscopy May 06, 2011 by Dr. Rob Bunting, who recommended repeat colonoscopy in 5 years.    Anemia    Background diabetic retinopathy 04/20/2012   Patient is followed by Dr. Dione Booze    Cancer [facility])    Kidney   Cardiomyopathy    LV function improved from 2004 to 2008.  Historically, moderately dilated LV with EF 30-40% by 2D echo 08/14/2002.  Mild CAD with severe LV dysfunction by cardiac cath 09/2002.  Normal coronary arteries and normal LV function by cardiac cath 09/19/2006.  A 2-D echo on 04/01/2009  showed mild concentric hypertrophy and normal systolic (LVEF  60-65%) and doppler C/W with grade 1 diastolic dysfunction.   CHF (congestive heart failure) (HCC)    LV function improved from 2004 to 2008.  Historically, moderately dilated LV with EF 30-40% by 2D echo 08/14/2002.  Mild CAD with severe LV dysfunction by cardiac cath 09/2002.  Normal coronary arteries and normal LV function by cardiac cath 09/19/2006.  A 2-D echo on 04/01/2009 showed mild concentric hypertrophy and normal systolic (LVEF  60-65%) and doppler C/W with grade 1 diastolic dysfunction..    Chronic combined systolic and diastolic congestive heart failure (HCC) 05/21/2010   LV function improved from 2004 to 2008.  Historically, moderately dilated LV with EF 30-40% by 2D echo 08/14/2002.  Mild CAD with severe LV dysfunction by cardiac cath 09/2002.  Normal coronary arteries and normal LV function by cardiac cath 09/19/2006.  A 2-D echo on 04/01/2009 showed mild concentric hypertrophy and normal systolic (LVEF  60-65%) and doppler parameters consistent with abnormal left    CVA (cerebral vascular accident) (HCC) 07/04/2012   MRI of the brain 07/04/2012 showed an acute infarct in the right basal ganglia involving the anterior putamen, anterior limb internal capsule, and head of the caudate; this measured approximately 2.5 cm in diameter.      Dermatitis    Diabetes mellitus    type 2   DIABETIC PERIPHERAL NEUROPATHY 08/03/2007   Qualifier: Diagnosis of  By: Meredith Pel MD, Dorene Sorrow     DM neuropathy, painful (HCC)    fingers and right knee   ESRD (end stage renal disease) (HCC)    Stage 4, on hemodailysis x 4 months as of 05-18-18 [facility],  tues thurs sat   ESRD (end stage renal disease) on dialysis (HCC)    "TTS; [facility]" (05/31/2018)   Hearing loss in right ear    Hyperlipidemia    Hypertension    Hypertensive crisis 07/28/2012   Hypertensive urgency 08/20/2014   Myocardial infarction [facility])  many yrs ago   Nephrotic syndrome  02/18/2013   A 24-hour urine collection 03/04/2013 showed total protein of 5,460 g and creatinine clearance of 80 mL/minute.  Patient was seen by Loma Sender at [facility] and a repeat 24-hour urine showed 10,407 mg protein.  Patient underwent kidney biopsy on 05/30/2013; pathology showed advanced diffuse and nodular diabetic nephropathy with vascular changes consistent with long-standing difficult to control hypertension.      Pneumonia 2014 and 2015   Renal insufficiency    Sleep apnea    11/06/20 - hasn't used it "in a while"        Objective:    Wts  05/22/2023       193   06/10/2022     190   03/31/2022       176   05/26/21 200 lb 1.9 oz (90.8 kg)  05/20/21 194 lb 7.1 oz (88.2 kg)  05/06/21 205 lb 14.4 oz (93.4 kg)    Vital signs reviewed  05/22/2023  - Note at rest 02 sats  % on RA   General appearance:    slt hoarse amb bm harsh spont throat clearin g   HEENT : Oropharynx  clear/ edentulous     Nasal turbinates nl    NECK :  without  apparent JVD/ palpable Nodes/TM    LUNGS: no acc muscle use,  Nl contour chest which is clear to A and P bilaterally without cough on insp or exp maneuvers   CV:  RRR  no s3 or murmur or increase in P2, and no edema   ABD:  soft and nontender with nl inspiratory excursion in the supine position. No bruits or organomegaly appreciated   MS:  Nl gait/ ext warm without deformities Or obvious joint restrictions  calf tenderness, cyanosis or clubbing    SKIN: warm and dry without lesions    NEURO:  alert, approp, nl sensorium with  no motor or cerebellar deficits apparent.                Assessment

## 2023-05-22 ENCOUNTER — Ambulatory Visit (INDEPENDENT_AMBULATORY_CARE_PROVIDER_SITE_OTHER): Payer: 59 | Admitting: Internal Medicine

## 2023-05-22 ENCOUNTER — Ambulatory Visit (HOSPITAL_COMMUNITY)
Admission: RE | Admit: 2023-05-22 | Discharge: 2023-05-22 | Disposition: A | Discharge status: Home / Self Care | Payer: 59 | Source: Ambulatory Visit | Attending: Internal Medicine | Admitting: Internal Medicine

## 2023-05-22 ENCOUNTER — Encounter: Payer: Self-pay | Admitting: Internal Medicine

## 2023-05-22 VITALS — BP 145/71 | HR 84 | Ht 72.0 in | Wt 193.0 lb

## 2023-05-22 DIAGNOSIS — R0989 Other specified symptoms and signs involving the circulatory and respiratory systems: Secondary | ICD-10-CM | POA: Diagnosis not present

## 2023-05-22 DIAGNOSIS — R053 Chronic cough: Secondary | ICD-10-CM | POA: Diagnosis not present

## 2023-05-22 DIAGNOSIS — R0609 Other forms of dyspnea: Secondary | ICD-10-CM

## 2023-05-22 DIAGNOSIS — I517 Cardiomegaly: Secondary | ICD-10-CM | POA: Diagnosis not present

## 2023-05-22 DIAGNOSIS — R058 Other specified cough: Secondary | ICD-10-CM | POA: Diagnosis not present

## 2023-05-22 DIAGNOSIS — G4733 Obstructive sleep apnea (adult) (pediatric): Secondary | ICD-10-CM | POA: Diagnosis not present

## 2023-05-22 MED ORDER — GABAPENTIN 300 MG PO CAPS: 300.0000 mg | ORAL_CAPSULE | Freq: Two times a day (BID) | ORAL | 2 refills | Status: DC

## 2023-05-22 MED ORDER — FAMOTIDINE 20 MG PO TABS
ORAL_TABLET | ORAL | 11 refills | Status: DC
Start: 2023-05-22 — End: 2023-11-21

## 2023-05-22 MED ORDER — PANTOPRAZOLE SODIUM 40 MG PO TBEC: DELAYED_RELEASE_TABLET | ORAL | 0 refills | Status: DC

## 2023-05-22 NOTE — Patient Instructions (Addendum)
Ok to continue [employer] 2 puffs each am (like high octane fuel) 0k me to stop it when better   Work on inhaler technique:  relax and gently blow all the way out then take a nice smooth full deep breath back in, triggering the inhaler at same time you start breathing in.  Hold breath in for at least  5 seconds if you can. Rinse and gargle with water when done.  If mouth or throat bother you at all,  try brushing teeth/gums/tongue with arm and hammer toothpaste/ make a slurry and gargle and spit out.  Pantoprazole (protonix) 40 mg   Take  30-60 min before first meal of the day and Pepcid (famotidine)  20 mg after supper until return to office - this is the best way to tell whether stomach acid is contributing to your problem.    GERD (REFLUX)  is an extremely common cause of respiratory symptoms just like yours , many times with no obvious heartburn at all.    It can be treated with medication, but also with lifestyle changes including  avoidance of late meals, excessive alcohol, and avoid fatty foods, chocolate, peppermint, colas, red wine, and acidic juices such as orange juice.  NO MINT OR MENTHOL PRODUCTS SO NO COUGH DROPS  USE SUGARLESS CANDY INSTEAD (Jolley ranchers or Stover's or Life Savers) or even ice chips will also do - the key is to swallow to prevent all throat clearing. NO OIL BASED VITAMINS - use powdered substitutes.  Avoid fish oil when coughing.   Increase gabapentin to 300 mg twice daily until return (take after dialysis)  PFTs next available - do not use Stiolto that am   Please remember to go to the  x-ray department  @  [facility] for your tests - we will call you with the results when they are available      Please schedule a follow up office visit in 6 weeks, call sooner if needed

## 2023-05-23 DIAGNOSIS — Z992 Dependence on renal dialysis: Secondary | ICD-10-CM | POA: Diagnosis not present

## 2023-05-23 DIAGNOSIS — E1129 Type 2 diabetes mellitus with other diabetic kidney complication: Secondary | ICD-10-CM | POA: Diagnosis not present

## 2023-05-23 DIAGNOSIS — N186 End stage renal disease: Secondary | ICD-10-CM | POA: Diagnosis not present

## 2023-05-23 DIAGNOSIS — N2581 Secondary hyperparathyroidism of renal origin: Secondary | ICD-10-CM | POA: Diagnosis not present

## 2023-05-23 DIAGNOSIS — D509 Iron deficiency anemia, unspecified: Secondary | ICD-10-CM | POA: Diagnosis not present

## 2023-05-23 DIAGNOSIS — D631 Anemia in chronic kidney disease: Secondary | ICD-10-CM | POA: Diagnosis not present

## 2023-05-23 DIAGNOSIS — D689 Coagulation defect, unspecified: Secondary | ICD-10-CM | POA: Diagnosis not present

## 2023-05-23 NOTE — Assessment & Plan Note (Signed)
Onset July 2022 rx with mint/menthol - 03/26/2021 rec no mint/menthol and rx with max gerd rx x 6 weeks then ov > did not follow instructions - 03/31/2022 try again same rx but increase gabapentin to 300 mg bid  and f/u in 4 weeks - 05/22/2023 recurrent off gerd/gabapentin rx > restart   Of the three most common causes of  Sub-acute / recurrent or chronic cough, only one (GERD)  can actually contribute to/ trigger  the other two (asthma and post nasal drip syndrome)  and perpetuate the cylce of cough.  While not intuitively obvious, many patients with chronic low grade reflux do not cough until there is a primary insult that disturbs the protective epithelial barrier and exposes sensitive nerve endings.   This is typically viral but can due to PNDS and  either may apply here.     >>>The point is that once this occurs, it is difficult to eliminate the cycle  using anything but a maximally effective acid suppression regimen at least in the short run, accompanied by an appropriate diet to address non acid GERD and control / eliminate the cough itself with gabapentin starting back on 300 mg bid as before and f/u in 6 weeks  Each maintenance medication was reviewed in detail including emphasizing most importantly the difference between maintenance and prns and under what circumstances the prns are to be triggered using an action plan format where appropriate.  Total time for H and P, chart review, counseling, reviewing smi device(s) , directly observing portions of ambulatory 02 saturation study/ and generating customized AVS unique to this office visit / same day charting > 40 min for multiple  refractory respiratory  symptoms of uncertain etiology

## 2023-05-23 NOTE — Assessment & Plan Note (Signed)
Onset July 2022  - pt not sure whether better or not berfore or after HD as of 03/26/2021  - 03/26/2021 rec max rx for gerd, observe whether reported noct sob correlates with HD - 03/26/2021   Walked on RA x  3  lap(s) =  approx 150 @ moderate pace, stopped due to end of study with  min sob  with lowest 02 sats 97 - Echo 05/18/21 EF similar to echo done 08/13/20 . Left ventricular ejection fraction, by estimation, is 30 to 35%. The left ventricle has severely decreased  function. The left ventricle demonstrates global hypokinesis. The left  ventricular internal cavity size was  severely dilated. Left ventricular diastolic parameters were normal.   2. Right ventricular systolic function is mildly reduced. The right  ventricular size is mildly enlarged.   3. Left atrial size was moderately dilated.   4. Right atrial size was moderately dilated.   5. The mitral valve is abnormal. Mild to moderate mitral valve  regurgitation. No evidence of mitral stenosis.   6. The aortic valve is tricuspid. Aortic valve regurgitation is trivial.  Mild aortic valve sclerosis is present, with no evidence of aortic valve  stenosis.  Left Atrium: Left atrial size was moderately dilated. - 05/26/2021   Walked on RA  x  3  lap(s) =  approx 450 ft @ fast pace, stopped due to end of study, min sob last lap with lowest 02 sats 97%   - 03/31/2022    stiolto and pred trial and restart gerd rx  f/u in 4 weeks with all meds in hand  - 06/10/2022  After extensive coaching inhaler device,  effectiveness =    75% with SMI > rechallenge with stiolto x 2 weeks then try off pending /fu pfts > did not do -  05/22/2023   Walked on RA  x  3  lap(s) =  approx 450  ft  @ mod pace, stopped due to end of study with lowest 02 sats 96% with sob but not cp/ cough    >>> 05/22/2023  After extensive coaching inhaler device,  effectiveness =    75% with respimat from a baseline of near 0 so ok to rechallenge as seemed to help in past pending return  for pfts  Comment: 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

## 2023-05-24 ENCOUNTER — Telehealth: Payer: Self-pay | Admitting: Internal Medicine

## 2023-05-24 NOTE — Telephone Encounter (Signed)
Patient is a walk in today requesting x-ray results from Monday 05/22/23.  Patient states his phone is out and he would like to wait to speak with someone about these results

## 2023-05-24 NOTE — Telephone Encounter (Signed)
Patient advised cxr has not been read yet and that we will contact him once we have received the results and Dr. Sherene Sires has reviewed them. Nothing further needed at this time.

## 2023-05-25 DIAGNOSIS — D631 Anemia in chronic kidney disease: Secondary | ICD-10-CM | POA: Diagnosis not present

## 2023-05-25 DIAGNOSIS — Z992 Dependence on renal dialysis: Secondary | ICD-10-CM | POA: Diagnosis not present

## 2023-05-25 DIAGNOSIS — N186 End stage renal disease: Secondary | ICD-10-CM | POA: Diagnosis not present

## 2023-05-25 DIAGNOSIS — D689 Coagulation defect, unspecified: Secondary | ICD-10-CM | POA: Diagnosis not present

## 2023-05-25 DIAGNOSIS — N2581 Secondary hyperparathyroidism of renal origin: Secondary | ICD-10-CM | POA: Diagnosis not present

## 2023-05-25 DIAGNOSIS — E1129 Type 2 diabetes mellitus with other diabetic kidney complication: Secondary | ICD-10-CM | POA: Diagnosis not present

## 2023-05-25 DIAGNOSIS — D509 Iron deficiency anemia, unspecified: Secondary | ICD-10-CM | POA: Diagnosis not present

## 2023-05-27 DIAGNOSIS — Z992 Dependence on renal dialysis: Secondary | ICD-10-CM | POA: Diagnosis not present

## 2023-05-27 DIAGNOSIS — E1129 Type 2 diabetes mellitus with other diabetic kidney complication: Secondary | ICD-10-CM | POA: Diagnosis not present

## 2023-05-27 DIAGNOSIS — D631 Anemia in chronic kidney disease: Secondary | ICD-10-CM | POA: Diagnosis not present

## 2023-05-27 DIAGNOSIS — D509 Iron deficiency anemia, unspecified: Secondary | ICD-10-CM | POA: Diagnosis not present

## 2023-05-27 DIAGNOSIS — N186 End stage renal disease: Secondary | ICD-10-CM | POA: Diagnosis not present

## 2023-05-27 DIAGNOSIS — N2581 Secondary hyperparathyroidism of renal origin: Secondary | ICD-10-CM | POA: Diagnosis not present

## 2023-05-27 DIAGNOSIS — D689 Coagulation defect, unspecified: Secondary | ICD-10-CM | POA: Diagnosis not present

## 2023-05-30 DIAGNOSIS — N186 End stage renal disease: Secondary | ICD-10-CM | POA: Diagnosis not present

## 2023-05-30 DIAGNOSIS — E1129 Type 2 diabetes mellitus with other diabetic kidney complication: Secondary | ICD-10-CM | POA: Diagnosis not present

## 2023-05-30 DIAGNOSIS — D509 Iron deficiency anemia, unspecified: Secondary | ICD-10-CM | POA: Diagnosis not present

## 2023-05-30 DIAGNOSIS — N2581 Secondary hyperparathyroidism of renal origin: Secondary | ICD-10-CM | POA: Diagnosis not present

## 2023-05-30 DIAGNOSIS — D689 Coagulation defect, unspecified: Secondary | ICD-10-CM | POA: Diagnosis not present

## 2023-05-30 DIAGNOSIS — D631 Anemia in chronic kidney disease: Secondary | ICD-10-CM | POA: Diagnosis not present

## 2023-05-30 DIAGNOSIS — Z992 Dependence on renal dialysis: Secondary | ICD-10-CM | POA: Diagnosis not present

## 2023-06-01 DIAGNOSIS — D509 Iron deficiency anemia, unspecified: Secondary | ICD-10-CM | POA: Diagnosis not present

## 2023-06-01 DIAGNOSIS — Z992 Dependence on renal dialysis: Secondary | ICD-10-CM | POA: Diagnosis not present

## 2023-06-01 DIAGNOSIS — N2581 Secondary hyperparathyroidism of renal origin: Secondary | ICD-10-CM | POA: Diagnosis not present

## 2023-06-01 DIAGNOSIS — D631 Anemia in chronic kidney disease: Secondary | ICD-10-CM | POA: Diagnosis not present

## 2023-06-01 DIAGNOSIS — N186 End stage renal disease: Secondary | ICD-10-CM | POA: Diagnosis not present

## 2023-06-01 DIAGNOSIS — E1129 Type 2 diabetes mellitus with other diabetic kidney complication: Secondary | ICD-10-CM | POA: Diagnosis not present

## 2023-06-01 DIAGNOSIS — D689 Coagulation defect, unspecified: Secondary | ICD-10-CM | POA: Diagnosis not present

## 2023-06-03 DIAGNOSIS — D631 Anemia in chronic kidney disease: Secondary | ICD-10-CM | POA: Diagnosis not present

## 2023-06-03 DIAGNOSIS — D689 Coagulation defect, unspecified: Secondary | ICD-10-CM | POA: Diagnosis not present

## 2023-06-03 DIAGNOSIS — E1129 Type 2 diabetes mellitus with other diabetic kidney complication: Secondary | ICD-10-CM | POA: Diagnosis not present

## 2023-06-03 DIAGNOSIS — Z992 Dependence on renal dialysis: Secondary | ICD-10-CM | POA: Diagnosis not present

## 2023-06-03 DIAGNOSIS — D509 Iron deficiency anemia, unspecified: Secondary | ICD-10-CM | POA: Diagnosis not present

## 2023-06-03 DIAGNOSIS — N186 End stage renal disease: Secondary | ICD-10-CM | POA: Diagnosis not present

## 2023-06-03 DIAGNOSIS — N2581 Secondary hyperparathyroidism of renal origin: Secondary | ICD-10-CM | POA: Diagnosis not present

## 2023-06-06 ENCOUNTER — Telehealth: Payer: Self-pay | Admitting: Internal Medicine

## 2023-06-06 DIAGNOSIS — E1129 Type 2 diabetes mellitus with other diabetic kidney complication: Secondary | ICD-10-CM | POA: Diagnosis not present

## 2023-06-06 DIAGNOSIS — N186 End stage renal disease: Secondary | ICD-10-CM | POA: Diagnosis not present

## 2023-06-06 DIAGNOSIS — Z992 Dependence on renal dialysis: Secondary | ICD-10-CM | POA: Diagnosis not present

## 2023-06-06 DIAGNOSIS — N2581 Secondary hyperparathyroidism of renal origin: Secondary | ICD-10-CM | POA: Diagnosis not present

## 2023-06-06 DIAGNOSIS — D631 Anemia in chronic kidney disease: Secondary | ICD-10-CM | POA: Diagnosis not present

## 2023-06-06 DIAGNOSIS — D509 Iron deficiency anemia, unspecified: Secondary | ICD-10-CM | POA: Diagnosis not present

## 2023-06-06 DIAGNOSIS — D689 Coagulation defect, unspecified: Secondary | ICD-10-CM | POA: Diagnosis not present

## 2023-06-06 NOTE — Telephone Encounter (Signed)
Patient had x-rays about 3 weeks ago and has not gotten his results.  Please call [phone]

## 2023-06-07 NOTE — Telephone Encounter (Signed)
Cardiac enlargement which is not new / no acute findings

## 2023-06-07 NOTE — Telephone Encounter (Signed)
Spoke with patient and advised. Nothing further needed at this time.   

## 2023-06-07 NOTE — Telephone Encounter (Signed)
Spoke to reading room - they are pushing this through today. MW aware.

## 2023-06-08 DIAGNOSIS — E1129 Type 2 diabetes mellitus with other diabetic kidney complication: Secondary | ICD-10-CM | POA: Diagnosis not present

## 2023-06-08 DIAGNOSIS — Z992 Dependence on renal dialysis: Secondary | ICD-10-CM | POA: Diagnosis not present

## 2023-06-08 DIAGNOSIS — D509 Iron deficiency anemia, unspecified: Secondary | ICD-10-CM | POA: Diagnosis not present

## 2023-06-08 DIAGNOSIS — N186 End stage renal disease: Secondary | ICD-10-CM | POA: Diagnosis not present

## 2023-06-08 DIAGNOSIS — D689 Coagulation defect, unspecified: Secondary | ICD-10-CM | POA: Diagnosis not present

## 2023-06-08 DIAGNOSIS — D631 Anemia in chronic kidney disease: Secondary | ICD-10-CM | POA: Diagnosis not present

## 2023-06-08 DIAGNOSIS — N2581 Secondary hyperparathyroidism of renal origin: Secondary | ICD-10-CM | POA: Diagnosis not present

## 2023-06-10 DIAGNOSIS — Z992 Dependence on renal dialysis: Secondary | ICD-10-CM | POA: Diagnosis not present

## 2023-06-10 DIAGNOSIS — D631 Anemia in chronic kidney disease: Secondary | ICD-10-CM | POA: Diagnosis not present

## 2023-06-10 DIAGNOSIS — E1129 Type 2 diabetes mellitus with other diabetic kidney complication: Secondary | ICD-10-CM | POA: Diagnosis not present

## 2023-06-10 DIAGNOSIS — N2581 Secondary hyperparathyroidism of renal origin: Secondary | ICD-10-CM | POA: Diagnosis not present

## 2023-06-10 DIAGNOSIS — D509 Iron deficiency anemia, unspecified: Secondary | ICD-10-CM | POA: Diagnosis not present

## 2023-06-10 DIAGNOSIS — N186 End stage renal disease: Secondary | ICD-10-CM | POA: Diagnosis not present

## 2023-06-10 DIAGNOSIS — D689 Coagulation defect, unspecified: Secondary | ICD-10-CM | POA: Diagnosis not present

## 2023-06-13 DIAGNOSIS — E1129 Type 2 diabetes mellitus with other diabetic kidney complication: Secondary | ICD-10-CM | POA: Diagnosis not present

## 2023-06-13 DIAGNOSIS — N2581 Secondary hyperparathyroidism of renal origin: Secondary | ICD-10-CM | POA: Diagnosis not present

## 2023-06-13 DIAGNOSIS — D509 Iron deficiency anemia, unspecified: Secondary | ICD-10-CM | POA: Diagnosis not present

## 2023-06-13 DIAGNOSIS — Z992 Dependence on renal dialysis: Secondary | ICD-10-CM | POA: Diagnosis not present

## 2023-06-13 DIAGNOSIS — D631 Anemia in chronic kidney disease: Secondary | ICD-10-CM | POA: Diagnosis not present

## 2023-06-13 DIAGNOSIS — N186 End stage renal disease: Secondary | ICD-10-CM | POA: Diagnosis not present

## 2023-06-13 DIAGNOSIS — D689 Coagulation defect, unspecified: Secondary | ICD-10-CM | POA: Diagnosis not present

## 2023-06-15 DIAGNOSIS — D509 Iron deficiency anemia, unspecified: Secondary | ICD-10-CM | POA: Diagnosis not present

## 2023-06-15 DIAGNOSIS — D631 Anemia in chronic kidney disease: Secondary | ICD-10-CM | POA: Diagnosis not present

## 2023-06-15 DIAGNOSIS — D689 Coagulation defect, unspecified: Secondary | ICD-10-CM | POA: Diagnosis not present

## 2023-06-15 DIAGNOSIS — E1129 Type 2 diabetes mellitus with other diabetic kidney complication: Secondary | ICD-10-CM | POA: Diagnosis not present

## 2023-06-15 DIAGNOSIS — N2581 Secondary hyperparathyroidism of renal origin: Secondary | ICD-10-CM | POA: Diagnosis not present

## 2023-06-15 DIAGNOSIS — N186 End stage renal disease: Secondary | ICD-10-CM | POA: Diagnosis not present

## 2023-06-15 DIAGNOSIS — Z992 Dependence on renal dialysis: Secondary | ICD-10-CM | POA: Diagnosis not present

## 2023-06-17 DIAGNOSIS — N2581 Secondary hyperparathyroidism of renal origin: Secondary | ICD-10-CM | POA: Diagnosis not present

## 2023-06-17 DIAGNOSIS — D509 Iron deficiency anemia, unspecified: Secondary | ICD-10-CM | POA: Diagnosis not present

## 2023-06-17 DIAGNOSIS — E1129 Type 2 diabetes mellitus with other diabetic kidney complication: Secondary | ICD-10-CM | POA: Diagnosis not present

## 2023-06-17 DIAGNOSIS — E039 Hypothyroidism, unspecified: Secondary | ICD-10-CM | POA: Diagnosis not present

## 2023-06-17 DIAGNOSIS — D689 Coagulation defect, unspecified: Secondary | ICD-10-CM | POA: Diagnosis not present

## 2023-06-17 DIAGNOSIS — Z992 Dependence on renal dialysis: Secondary | ICD-10-CM | POA: Diagnosis not present

## 2023-06-17 DIAGNOSIS — N186 End stage renal disease: Secondary | ICD-10-CM | POA: Diagnosis not present

## 2023-06-20 DIAGNOSIS — N186 End stage renal disease: Secondary | ICD-10-CM | POA: Diagnosis not present

## 2023-06-20 DIAGNOSIS — E1129 Type 2 diabetes mellitus with other diabetic kidney complication: Secondary | ICD-10-CM | POA: Diagnosis not present

## 2023-06-20 DIAGNOSIS — D509 Iron deficiency anemia, unspecified: Secondary | ICD-10-CM | POA: Diagnosis not present

## 2023-06-20 DIAGNOSIS — E039 Hypothyroidism, unspecified: Secondary | ICD-10-CM | POA: Diagnosis not present

## 2023-06-20 DIAGNOSIS — N2581 Secondary hyperparathyroidism of renal origin: Secondary | ICD-10-CM | POA: Diagnosis not present

## 2023-06-20 DIAGNOSIS — D689 Coagulation defect, unspecified: Secondary | ICD-10-CM | POA: Diagnosis not present

## 2023-06-20 DIAGNOSIS — Z992 Dependence on renal dialysis: Secondary | ICD-10-CM | POA: Diagnosis not present

## 2023-06-22 DIAGNOSIS — D509 Iron deficiency anemia, unspecified: Secondary | ICD-10-CM | POA: Diagnosis not present

## 2023-06-22 DIAGNOSIS — E039 Hypothyroidism, unspecified: Secondary | ICD-10-CM | POA: Diagnosis not present

## 2023-06-22 DIAGNOSIS — D689 Coagulation defect, unspecified: Secondary | ICD-10-CM | POA: Diagnosis not present

## 2023-06-22 DIAGNOSIS — N2581 Secondary hyperparathyroidism of renal origin: Secondary | ICD-10-CM | POA: Diagnosis not present

## 2023-06-22 DIAGNOSIS — N186 End stage renal disease: Secondary | ICD-10-CM | POA: Diagnosis not present

## 2023-06-22 DIAGNOSIS — E1129 Type 2 diabetes mellitus with other diabetic kidney complication: Secondary | ICD-10-CM | POA: Diagnosis not present

## 2023-06-22 DIAGNOSIS — Z992 Dependence on renal dialysis: Secondary | ICD-10-CM | POA: Diagnosis not present

## 2023-06-24 DIAGNOSIS — N186 End stage renal disease: Secondary | ICD-10-CM | POA: Diagnosis not present

## 2023-06-24 DIAGNOSIS — Z992 Dependence on renal dialysis: Secondary | ICD-10-CM | POA: Diagnosis not present

## 2023-06-24 DIAGNOSIS — N2581 Secondary hyperparathyroidism of renal origin: Secondary | ICD-10-CM | POA: Diagnosis not present

## 2023-06-24 DIAGNOSIS — E1129 Type 2 diabetes mellitus with other diabetic kidney complication: Secondary | ICD-10-CM | POA: Diagnosis not present

## 2023-06-24 DIAGNOSIS — E039 Hypothyroidism, unspecified: Secondary | ICD-10-CM | POA: Diagnosis not present

## 2023-06-24 DIAGNOSIS — D689 Coagulation defect, unspecified: Secondary | ICD-10-CM | POA: Diagnosis not present

## 2023-06-24 DIAGNOSIS — D509 Iron deficiency anemia, unspecified: Secondary | ICD-10-CM | POA: Diagnosis not present

## 2023-06-27 DIAGNOSIS — Z992 Dependence on renal dialysis: Secondary | ICD-10-CM | POA: Diagnosis not present

## 2023-06-27 DIAGNOSIS — N186 End stage renal disease: Secondary | ICD-10-CM | POA: Diagnosis not present

## 2023-06-27 DIAGNOSIS — D689 Coagulation defect, unspecified: Secondary | ICD-10-CM | POA: Diagnosis not present

## 2023-06-27 DIAGNOSIS — E039 Hypothyroidism, unspecified: Secondary | ICD-10-CM | POA: Diagnosis not present

## 2023-06-27 DIAGNOSIS — E1129 Type 2 diabetes mellitus with other diabetic kidney complication: Secondary | ICD-10-CM | POA: Diagnosis not present

## 2023-06-27 DIAGNOSIS — N2581 Secondary hyperparathyroidism of renal origin: Secondary | ICD-10-CM | POA: Diagnosis not present

## 2023-06-27 DIAGNOSIS — D509 Iron deficiency anemia, unspecified: Secondary | ICD-10-CM | POA: Diagnosis not present

## 2023-06-29 DIAGNOSIS — E1129 Type 2 diabetes mellitus with other diabetic kidney complication: Secondary | ICD-10-CM | POA: Diagnosis not present

## 2023-06-29 DIAGNOSIS — E039 Hypothyroidism, unspecified: Secondary | ICD-10-CM | POA: Diagnosis not present

## 2023-06-29 DIAGNOSIS — D689 Coagulation defect, unspecified: Secondary | ICD-10-CM | POA: Diagnosis not present

## 2023-06-29 DIAGNOSIS — N186 End stage renal disease: Secondary | ICD-10-CM | POA: Diagnosis not present

## 2023-06-29 DIAGNOSIS — Z992 Dependence on renal dialysis: Secondary | ICD-10-CM | POA: Diagnosis not present

## 2023-06-29 DIAGNOSIS — D509 Iron deficiency anemia, unspecified: Secondary | ICD-10-CM | POA: Diagnosis not present

## 2023-06-29 DIAGNOSIS — N2581 Secondary hyperparathyroidism of renal origin: Secondary | ICD-10-CM | POA: Diagnosis not present

## 2023-07-01 DIAGNOSIS — N2581 Secondary hyperparathyroidism of renal origin: Secondary | ICD-10-CM | POA: Diagnosis not present

## 2023-07-01 DIAGNOSIS — D509 Iron deficiency anemia, unspecified: Secondary | ICD-10-CM | POA: Diagnosis not present

## 2023-07-01 DIAGNOSIS — D689 Coagulation defect, unspecified: Secondary | ICD-10-CM | POA: Diagnosis not present

## 2023-07-01 DIAGNOSIS — E1129 Type 2 diabetes mellitus with other diabetic kidney complication: Secondary | ICD-10-CM | POA: Diagnosis not present

## 2023-07-01 DIAGNOSIS — N186 End stage renal disease: Secondary | ICD-10-CM | POA: Diagnosis not present

## 2023-07-01 DIAGNOSIS — E039 Hypothyroidism, unspecified: Secondary | ICD-10-CM | POA: Diagnosis not present

## 2023-07-01 DIAGNOSIS — Z992 Dependence on renal dialysis: Secondary | ICD-10-CM | POA: Diagnosis not present

## 2023-07-03 ENCOUNTER — Ambulatory Visit: Payer: 59 | Admitting: Internal Medicine

## 2023-07-04 DIAGNOSIS — D509 Iron deficiency anemia, unspecified: Secondary | ICD-10-CM | POA: Diagnosis not present

## 2023-07-04 DIAGNOSIS — Z992 Dependence on renal dialysis: Secondary | ICD-10-CM | POA: Diagnosis not present

## 2023-07-04 DIAGNOSIS — N2581 Secondary hyperparathyroidism of renal origin: Secondary | ICD-10-CM | POA: Diagnosis not present

## 2023-07-04 DIAGNOSIS — E1129 Type 2 diabetes mellitus with other diabetic kidney complication: Secondary | ICD-10-CM | POA: Diagnosis not present

## 2023-07-04 DIAGNOSIS — N186 End stage renal disease: Secondary | ICD-10-CM | POA: Diagnosis not present

## 2023-07-04 DIAGNOSIS — E039 Hypothyroidism, unspecified: Secondary | ICD-10-CM | POA: Diagnosis not present

## 2023-07-04 DIAGNOSIS — D689 Coagulation defect, unspecified: Secondary | ICD-10-CM | POA: Diagnosis not present

## 2023-07-06 DIAGNOSIS — E039 Hypothyroidism, unspecified: Secondary | ICD-10-CM | POA: Diagnosis not present

## 2023-07-06 DIAGNOSIS — N186 End stage renal disease: Secondary | ICD-10-CM | POA: Diagnosis not present

## 2023-07-06 DIAGNOSIS — D509 Iron deficiency anemia, unspecified: Secondary | ICD-10-CM | POA: Diagnosis not present

## 2023-07-06 DIAGNOSIS — D689 Coagulation defect, unspecified: Secondary | ICD-10-CM | POA: Diagnosis not present

## 2023-07-06 DIAGNOSIS — E1129 Type 2 diabetes mellitus with other diabetic kidney complication: Secondary | ICD-10-CM | POA: Diagnosis not present

## 2023-07-06 DIAGNOSIS — N2581 Secondary hyperparathyroidism of renal origin: Secondary | ICD-10-CM | POA: Diagnosis not present

## 2023-07-06 DIAGNOSIS — Z992 Dependence on renal dialysis: Secondary | ICD-10-CM | POA: Diagnosis not present

## 2023-07-08 DIAGNOSIS — E039 Hypothyroidism, unspecified: Secondary | ICD-10-CM | POA: Diagnosis not present

## 2023-07-08 DIAGNOSIS — Z992 Dependence on renal dialysis: Secondary | ICD-10-CM | POA: Diagnosis not present

## 2023-07-08 DIAGNOSIS — E1129 Type 2 diabetes mellitus with other diabetic kidney complication: Secondary | ICD-10-CM | POA: Diagnosis not present

## 2023-07-08 DIAGNOSIS — N186 End stage renal disease: Secondary | ICD-10-CM | POA: Diagnosis not present

## 2023-07-08 DIAGNOSIS — N2581 Secondary hyperparathyroidism of renal origin: Secondary | ICD-10-CM | POA: Diagnosis not present

## 2023-07-08 DIAGNOSIS — D509 Iron deficiency anemia, unspecified: Secondary | ICD-10-CM | POA: Diagnosis not present

## 2023-07-08 DIAGNOSIS — D689 Coagulation defect, unspecified: Secondary | ICD-10-CM | POA: Diagnosis not present

## 2023-07-10 DIAGNOSIS — E039 Hypothyroidism, unspecified: Secondary | ICD-10-CM | POA: Diagnosis not present

## 2023-07-10 DIAGNOSIS — Z992 Dependence on renal dialysis: Secondary | ICD-10-CM | POA: Diagnosis not present

## 2023-07-10 DIAGNOSIS — D689 Coagulation defect, unspecified: Secondary | ICD-10-CM | POA: Diagnosis not present

## 2023-07-10 DIAGNOSIS — D509 Iron deficiency anemia, unspecified: Secondary | ICD-10-CM | POA: Diagnosis not present

## 2023-07-10 DIAGNOSIS — N2581 Secondary hyperparathyroidism of renal origin: Secondary | ICD-10-CM | POA: Diagnosis not present

## 2023-07-10 DIAGNOSIS — E1129 Type 2 diabetes mellitus with other diabetic kidney complication: Secondary | ICD-10-CM | POA: Diagnosis not present

## 2023-07-10 DIAGNOSIS — N186 End stage renal disease: Secondary | ICD-10-CM | POA: Diagnosis not present

## 2023-07-12 DIAGNOSIS — N2581 Secondary hyperparathyroidism of renal origin: Secondary | ICD-10-CM | POA: Diagnosis not present

## 2023-07-12 DIAGNOSIS — D509 Iron deficiency anemia, unspecified: Secondary | ICD-10-CM | POA: Diagnosis not present

## 2023-07-12 DIAGNOSIS — D689 Coagulation defect, unspecified: Secondary | ICD-10-CM | POA: Diagnosis not present

## 2023-07-12 DIAGNOSIS — E039 Hypothyroidism, unspecified: Secondary | ICD-10-CM | POA: Diagnosis not present

## 2023-07-12 DIAGNOSIS — N186 End stage renal disease: Secondary | ICD-10-CM | POA: Diagnosis not present

## 2023-07-12 DIAGNOSIS — E1129 Type 2 diabetes mellitus with other diabetic kidney complication: Secondary | ICD-10-CM | POA: Diagnosis not present

## 2023-07-12 DIAGNOSIS — Z992 Dependence on renal dialysis: Secondary | ICD-10-CM | POA: Diagnosis not present

## 2023-07-15 DIAGNOSIS — N2581 Secondary hyperparathyroidism of renal origin: Secondary | ICD-10-CM | POA: Diagnosis not present

## 2023-07-15 DIAGNOSIS — Z992 Dependence on renal dialysis: Secondary | ICD-10-CM | POA: Diagnosis not present

## 2023-07-15 DIAGNOSIS — N186 End stage renal disease: Secondary | ICD-10-CM | POA: Diagnosis not present

## 2023-07-15 DIAGNOSIS — E1129 Type 2 diabetes mellitus with other diabetic kidney complication: Secondary | ICD-10-CM | POA: Diagnosis not present

## 2023-07-15 DIAGNOSIS — E039 Hypothyroidism, unspecified: Secondary | ICD-10-CM | POA: Diagnosis not present

## 2023-07-15 DIAGNOSIS — D689 Coagulation defect, unspecified: Secondary | ICD-10-CM | POA: Diagnosis not present

## 2023-07-15 DIAGNOSIS — D509 Iron deficiency anemia, unspecified: Secondary | ICD-10-CM | POA: Diagnosis not present

## 2023-07-16 DIAGNOSIS — G4733 Obstructive sleep apnea (adult) (pediatric): Secondary | ICD-10-CM | POA: Diagnosis not present

## 2023-07-18 DIAGNOSIS — N186 End stage renal disease: Secondary | ICD-10-CM | POA: Diagnosis not present

## 2023-07-18 DIAGNOSIS — Z992 Dependence on renal dialysis: Secondary | ICD-10-CM | POA: Diagnosis not present

## 2023-07-18 DIAGNOSIS — R197 Diarrhea, unspecified: Secondary | ICD-10-CM | POA: Diagnosis not present

## 2023-07-18 DIAGNOSIS — N2581 Secondary hyperparathyroidism of renal origin: Secondary | ICD-10-CM | POA: Diagnosis not present

## 2023-07-18 DIAGNOSIS — L299 Pruritus, unspecified: Secondary | ICD-10-CM | POA: Diagnosis not present

## 2023-07-18 DIAGNOSIS — D689 Coagulation defect, unspecified: Secondary | ICD-10-CM | POA: Diagnosis not present

## 2023-07-20 DIAGNOSIS — Z992 Dependence on renal dialysis: Secondary | ICD-10-CM | POA: Diagnosis not present

## 2023-07-20 DIAGNOSIS — L299 Pruritus, unspecified: Secondary | ICD-10-CM | POA: Diagnosis not present

## 2023-07-20 DIAGNOSIS — D689 Coagulation defect, unspecified: Secondary | ICD-10-CM | POA: Diagnosis not present

## 2023-07-20 DIAGNOSIS — N186 End stage renal disease: Secondary | ICD-10-CM | POA: Diagnosis not present

## 2023-07-20 DIAGNOSIS — N2581 Secondary hyperparathyroidism of renal origin: Secondary | ICD-10-CM | POA: Diagnosis not present

## 2023-07-20 DIAGNOSIS — R197 Diarrhea, unspecified: Secondary | ICD-10-CM | POA: Diagnosis not present

## 2023-07-21 ENCOUNTER — Ambulatory Visit (HOSPITAL_BASED_OUTPATIENT_CLINIC_OR_DEPARTMENT_OTHER): Payer: 59 | Admitting: Internal Medicine

## 2023-07-21 DIAGNOSIS — R058 Other specified cough: Secondary | ICD-10-CM | POA: Diagnosis not present

## 2023-07-21 LAB — PULMONARY FUNCTION TEST
DL/VA % pred: 106 %
DL/VA: 4.43 ml/min/mmHg/L
DLCO cor % pred: 63 %
DLCO cor: 15.85 ml/min/mmHg
DLCO unc % pred: 63 %
DLCO unc: 15.85 ml/min/mmHg
FEF 25-75 Post: 0.75 L/s
FEF 25-75 Pre: 0.65 L/s
FEF2575-%Change-Post: 15 %
FEF2575-%Pred-Post: 29 %
FEF2575-%Pred-Pre: 25 %
FEV1-%Change-Post: -4 %
FEV1-%Pred-Post: 35 %
FEV1-%Pred-Pre: 37 %
FEV1-Post: 1.11 L
FEV1-Pre: 1.17 L
FEV1FVC-%Change-Post: -5 %
FEV1FVC-%Pred-Pre: 81 %
FEV6-%Change-Post: 1 %
FEV6-%Pred-Post: 47 %
FEV6-%Pred-Pre: 47 %
FEV6-Post: 1.92 L
FEV6-Pre: 1.89 L
FEV6FVC-%Change-Post: 0 %
FEV6FVC-%Pred-Post: 104 %
FEV6FVC-%Pred-Pre: 103 %
FVC-%Change-Post: 0 %
FVC-%Pred-Post: 45 %
FVC-%Pred-Pre: 45 %
FVC-Post: 1.93 L
FVC-Pre: 1.92 L
Post FEV1/FVC ratio: 57 %
Post FEV6/FVC ratio: 99 %
Pre FEV1/FVC ratio: 61 %
Pre FEV6/FVC Ratio: 98 %
RV % pred: 145 %
RV: 3.28 L
TLC % pred: 78 %
TLC: 5.18 L

## 2023-07-21 NOTE — Patient Instructions (Signed)
Full PFT Performed Today,

## 2023-07-21 NOTE — Progress Notes (Signed)
Full PFT Performed Today,

## 2023-07-22 DIAGNOSIS — D689 Coagulation defect, unspecified: Secondary | ICD-10-CM | POA: Diagnosis not present

## 2023-07-22 DIAGNOSIS — N186 End stage renal disease: Secondary | ICD-10-CM | POA: Diagnosis not present

## 2023-07-22 DIAGNOSIS — L299 Pruritus, unspecified: Secondary | ICD-10-CM | POA: Diagnosis not present

## 2023-07-22 DIAGNOSIS — R197 Diarrhea, unspecified: Secondary | ICD-10-CM | POA: Diagnosis not present

## 2023-07-22 DIAGNOSIS — N2581 Secondary hyperparathyroidism of renal origin: Secondary | ICD-10-CM | POA: Diagnosis not present

## 2023-07-22 DIAGNOSIS — Z992 Dependence on renal dialysis: Secondary | ICD-10-CM | POA: Diagnosis not present

## 2023-07-23 NOTE — Progress Notes (Unsigned)
Charles Hart, male    DOB: April 16, 1956    MRN: [id number]    Brief patient profile:  31 yobm never smoker   referred to pulmonary clinic in [place]  03/26/2021 by Dr   [name] for cough abrupt onset early July 2022 assoc with sob noct but not with adls    History of Present Illness  03/26/2021  Pulmonary/ 1st office eval/ Shawana Knoch / [facility] Office  Chief Complaint  Patient presents with   Pulmonary Consult    Referred by Dr. [name]. Pt c/o non prod cough with SOB x 1 month.    Dyspnea:  MMRC1 = can walk nl pace, flat grade, can't hurry or go uphills or steps s sob   Cough: dry less when asleep  Sleep: sob worse  when lie down - can't tell worse on nights when not HD SABA use: none  Using lots of mints/ menthols due to sense of pnds  Rec Pantoprazole (protonix) 40 mg   Take  30-60 min before first meal of the day and Pepcid (famotidine)  20 mg after supper until return to office -  GERD diet reviewed, bed blocks rec  Pay attention to how you do with your breathing when you lie down on Sat night  vs Sun vs Monday Please schedule a follow up office visit in 8 weeks, call sooner if needed with all medications /inhalers/ solutions in hand   Date of Admission: 05/17/2021  2:26 PM Date of Discharge: 05/20/2021  7:02 PM   Discharge Diagnosis: 1. Acute exacerbation of heart failure  2. Chronic systolic and diastolic Heart Failure 3. ESRD on Hemodialysis 4. Hypertension 5. Hypokalemia 6. Obstructive Sleep Apnea 7. Normocytic anemia   05/22/2023  f/u ov/[place] office/Charles Hart re: cough/ doe  restarted  stiolto x month  / on prn gabapentin (really not taking it nor gerd rx consistently  Chief Complaint  Patient presents with   Upper airway cough syndrome   Dyspnea:  lowe's walking  x 4 aisles and has to stop due to sob  Cough: some dry no pattern  Sleeping: 30 degrees  waking 2- 3 times per week cough / sob   SABA use: none  02: prn during HD / no change on Monday  nights vs any other  Rec Ok to continue [employer] 2 puffs each am (like high octane fuel) 0k me to stop it when better  Work on inhaler technique:  Pantoprazole (protonix) 40 mg   Take  30-60 min before first meal of the day and Pepcid (famotidine)  20 mg after supper until return to office GERD diet reviewed, bed blocks rec  Increase gabapentin to 300 mg twice daily until return (take after dialysis) Cxr : CM s infiltrates   - PFT's  07/21/23  FEV1 1.07 (37 % ) ratio 0.61  p 0 % improvement from saba p 0 prior to study with DLCO  15.85 (63%)   and FV curve no significant concavity / sawtooth pattern  and ERV 11% at wt 196     07/24/2023  f/u ov/[place] office/Orazio Weller re: severe airflow obst  maint on gabapentin    Chief Complaint  Patient presents with   Cough  Dyspnea: limited more by back than breathing - one day prio to ov could do anything  Cough: better  Sleeping: no resp cc  SABA use: none  02: none      No obvious day to day or daytime variability or assoc excess/ purulent sputum or  mucus plugs or hemoptysis or cp or chest tightness, subjective wheeze or overt sinus or hb symptoms.    Also denies any obvious fluctuation of symptoms with weather or environmental changes or other aggravating or alleviating factors except as outlined above   No unusual exposure hx or h/o childhood pna/ asthma or knowledge of premature birth.  Current Allergies, Complete Past Medical History, Past Surgical History, Family History, and Social History were reviewed in [employer] record.  ROS  The following are not active complaints unless bolded Hoarseness, sore throat, dysphagia, dental problems, itching, sneezing,  nasal congestion or discharge of excess mucus or purulent secretions, ear ache,   fever, chills, sweats, unintended wt loss or wt gain, classically pleuritic or exertional cp,  orthopnea pnd or arm/hand swelling  or leg swelling, presyncope, palpitations, abdominal  pain, anorexia, nausea, vomiting, diarrhea  or change in bowel habits or change in bladder habits, change in stools or change in urine, dysuria, hematuria,  rash, arthralgias, visual complaints, headache, numbness, weakness or ataxia or problems with walking or coordination,  change in mood or  memory.        Current Meds  Medication Sig   acetaminophen (TYLENOL) 325 MG tablet Take 2 tablets (650 mg total) by mouth every 6 (six) hours as needed for mild pain.   albuterol (VENTOLIN HFA) 108 (90 Base) MCG/ACT inhaler Inhale 2 puffs into the lungs every 6 (six) hours as needed for wheezing or shortness of breath.   calcium acetate (PHOSLO) 667 MG capsule Take 2,001 mg by mouth 3 (three) times daily with meals.    carvedilol (COREG) 6.25 MG tablet Take 6.25 mg by mouth 2 (two) times daily.   famotidine (PEPCID) 20 MG tablet One after supper   fluticasone (FLONASE) 50 MCG/ACT nasal spray Place 2 sprays into both nostrils as needed for allergies.   folic acid-vitamin b complex-vitamin c-selenium-zinc (DIALYVITE) 3 MG TABS tablet Take 1 tablet by mouth daily.   furosemide (LASIX) 80 MG tablet Take 1 tablet (80 mg total) by mouth daily.   gabapentin (NEURONTIN) 300 MG capsule Take 1 capsule (300 mg total) by mouth 2 (two) times daily.   hydrALAZINE (APRESOLINE) 100 MG tablet Take 1 tablet (100 mg total) by mouth 3 (three) times daily.   pantoprazole (PROTONIX) 40 MG tablet One twice daily (take after dialysis)   pravastatin (PRAVACHOL) 40 MG tablet Take 1 tablet (40 mg total) by mouth every evening.   Tiotropium Bromide-Olodaterol (STIOLTO RESPIMAT) 2.5-2.5 MCG/ACT AERS INHALE 2 PUFFS INTO THE LUNGS IN THE MORNING             Past Medical History:  Diagnosis Date   Adenomatous colon polyp 07/02/2011   Last colonoscopy May 06, 2011 by Dr. Rob Bunting, who recommended repeat colonoscopy in 5 years.    Anemia    Background diabetic retinopathy 04/20/2012   Patient is followed by Dr. Dione Booze     Cancer [facility])    Kidney   Cardiomyopathy    LV function improved from 2004 to 2008.  Historically, moderately dilated LV with EF 30-40% by 2D echo 08/14/2002.  Mild CAD with severe LV dysfunction by cardiac cath 09/2002.  Normal coronary arteries and normal LV function by cardiac cath 09/19/2006.  A 2-D echo on 04/01/2009 showed mild concentric hypertrophy and normal systolic (LVEF  60-65%) and doppler C/W with grade 1 diastolic dysfunction.   CHF (congestive heart failure) (HCC)    LV function improved from 2004 to 2008.  Historically, moderately dilated LV with EF 30-40% by 2D echo 08/14/2002.  Mild CAD with severe LV dysfunction by cardiac cath 09/2002.  Normal coronary arteries and normal LV function by cardiac cath 09/19/2006.  A 2-D echo on 04/01/2009 showed mild concentric hypertrophy and normal systolic (LVEF  60-65%) and doppler C/W with grade 1 diastolic dysfunction..    Chronic combined systolic and diastolic congestive heart failure (HCC) 05/21/2010   LV function improved from 2004 to 2008.  Historically, moderately dilated LV with EF 30-40% by 2D echo 08/14/2002.  Mild CAD with severe LV dysfunction by cardiac cath 09/2002.  Normal coronary arteries and normal LV function by cardiac cath 09/19/2006.  A 2-D echo on 04/01/2009 showed mild concentric hypertrophy and normal systolic (LVEF  60-65%) and doppler parameters consistent with abnormal left    CVA (cerebral vascular accident) (HCC) 07/04/2012   MRI of the brain 07/04/2012 showed an acute infarct in the right basal ganglia involving the anterior putamen, anterior limb internal capsule, and head of the caudate; this measured approximately 2.5 cm in diameter.      Dermatitis    Diabetes mellitus    type 2   DIABETIC PERIPHERAL NEUROPATHY 08/03/2007   Qualifier: Diagnosis of  By: Meredith Pel MD, Dorene Sorrow     DM neuropathy, painful (HCC)    fingers and right knee   ESRD (end stage renal disease) (HCC)    Stage 4, on hemodailysis x 4 months as of 05-18-18  [place] [facility], tues thurs sat   ESRD (end stage renal disease) on dialysis (HCC)    "TTS; [facility]" (05/31/2018)   Hearing loss in right ear    Hyperlipidemia    Hypertension    Hypertensive crisis 07/28/2012   Hypertensive urgency 08/20/2014   Myocardial infarction [facility])  many yrs ago   Nephrotic syndrome 02/18/2013   A 24-hour urine collection 03/04/2013 showed total protein of 5,460 g and creatinine clearance of 80 mL/minute.  Patient was seen by Loma Sender at [facility] and a repeat 24-hour urine showed 10,407 mg protein.  Patient underwent kidney biopsy on 05/30/2013; pathology showed advanced diffuse and nodular diabetic nephropathy with vascular changes consistent with long-standing difficult to control hypertension.      Pneumonia 2014 and 2015   Renal insufficiency    Sleep apnea    11/06/20 - hasn't used it "in a while"        Objective:    Wts  07/24/2023        *** 05/22/2023       193   06/10/2022     190   03/31/2022       176   05/26/21 200 lb 1.9 oz (90.8 kg)  05/20/21 194 lb 7.1 oz (88.2 kg)  05/06/21 205 lb 14.4 oz (93.4 kg)    Vital signs reviewed  07/24/2023  - Note at rest 02 sats  ***% on ***   General appearance:    ***         CXR PA and Lateral:   05/22/2023 :    I personally reviewed images and impression is as follows:     Marked CM / no acute findings          Assessment

## 2023-07-24 ENCOUNTER — Encounter: Payer: Self-pay | Admitting: Internal Medicine

## 2023-07-24 ENCOUNTER — Ambulatory Visit (INDEPENDENT_AMBULATORY_CARE_PROVIDER_SITE_OTHER): Payer: 59 | Admitting: Internal Medicine

## 2023-07-24 VITALS — BP 141/68 | HR 87 | Ht 70.5 in | Wt 202.0 lb

## 2023-07-24 DIAGNOSIS — R058 Other specified cough: Secondary | ICD-10-CM | POA: Diagnosis not present

## 2023-07-24 DIAGNOSIS — R0609 Other forms of dyspnea: Secondary | ICD-10-CM

## 2023-07-24 NOTE — Patient Instructions (Signed)
If problems wih breathing or cough please return  with all medications /inhalers/ solutions in hand so we can verify exactly what you are taking. This includes all medications from all doctors and over the counters   If you are satisfied with your treatment plan,  let your doctor know and he/she can either refill your medications or you can return here when your prescription runs out.     If in any way you are not 100% satisfied,  please tell us.  If 100% better, tell your friends!  Pulmonary follow up is as needed

## 2023-07-25 DIAGNOSIS — R197 Diarrhea, unspecified: Secondary | ICD-10-CM | POA: Diagnosis not present

## 2023-07-25 DIAGNOSIS — Z992 Dependence on renal dialysis: Secondary | ICD-10-CM | POA: Diagnosis not present

## 2023-07-25 DIAGNOSIS — D689 Coagulation defect, unspecified: Secondary | ICD-10-CM | POA: Diagnosis not present

## 2023-07-25 DIAGNOSIS — L299 Pruritus, unspecified: Secondary | ICD-10-CM | POA: Diagnosis not present

## 2023-07-25 DIAGNOSIS — N186 End stage renal disease: Secondary | ICD-10-CM | POA: Diagnosis not present

## 2023-07-25 DIAGNOSIS — N2581 Secondary hyperparathyroidism of renal origin: Secondary | ICD-10-CM | POA: Diagnosis not present

## 2023-07-25 NOTE — Assessment & Plan Note (Signed)
Onset July 2022 rx with mint/menthol - 03/26/2021 rec no mint/menthol and rx with max gerd rx x 6 weeks then ov > did not follow instructions - 03/31/2022 try again same rx but increase gabapentin to 300 mg bid  and f/u in 4 weeks - 05/22/2023 recurrent off gerd/gabapentin rx > restart  -  07/21/23  f/v loop with no significant  concavity and unusual sawtooth patter  exp > insp   Appears to be adequately controlled on gabapentin 300 mg bid and max gerd rx

## 2023-07-25 NOTE — Assessment & Plan Note (Signed)
Onset July 2022  - pt not sure whether better or not berfore or after HD as of 03/26/2021  - 03/26/2021 rec max rx for gerd, observe whether reported noct sob correlates with HD - 03/26/2021   Walked on RA x  3  lap(s) =  approx 150 @ moderate pace, stopped due to end of study with  min sob  with lowest 02 sats 97 - Echo 05/18/21 EF similar to echo done 08/13/20 . Left ventricular ejection fraction, by estimation, is 30 to 35%. The left ventricle has severely decreased  function. The left ventricle demonstrates global hypokinesis. The left  ventricular internal cavity size was  severely dilated. Left ventricular diastolic parameters were normal.   2. Right ventricular systolic function is mildly reduced. The right  ventricular size is mildly enlarged.   3. Left atrial size was moderately dilated.   4. Right atrial size was moderately dilated.   5. The mitral valve is abnormal. Mild to moderate mitral valve  regurgitation. No evidence of mitral stenosis.   6. The aortic valve is tricuspid. Aortic valve regurgitation is trivial.  Mild aortic valve sclerosis is present, with no evidence of aortic valve  stenosis.  [facility]: Left atrial size was moderately dilated. - 05/26/2021   Walked on RA  x  3  lap(s) =  approx 450 ft @ fast pace, stopped due to end of study, min sob last lap with lowest 02 sats 97%   - 03/31/2022    stiolto and pred trial and restart gerd rx  f/u in 4 weeks with all meds in hand  - 06/10/2022  After extensive coaching inhaler device,  effectiveness =    75% with SMI > rechallenge with stiolto x 2 weeks then try off pending /fu pfts > did not do -  05/22/2023   Walked on RA  x  3  lap(s) =  approx 450  ft  @ mod pace, stopped due to end of study with lowest 02 sats 96% with sob but not cp/ cough    - 05/22/2023  After extensive coaching inhaler device,  effectiveness =    75% with respimat from a baseline of near 0 so ok to rechallenge   - PFT's  07/21/23  FEV1 1.07 (37 % ) ratio  0.61  p 0 % improvement from saba p 0 prior to study with DLCO  15.85 (63%)   and FV curve no significant concavity / sawtooth pattern  and ERV 11 at wt 196    Pfts don't fit the typical copd pattern and he has never smoked but says he can "do anything" s doe when back not limiting him and presently not using any inhalers at all.  Encourged him to either use stiolto perfectly regularly if his breathing starts to limit him and see if any benefit and if not return here with all meds in hand using a trust but verify approach to confirm accurate Medication  Reconciliation The principal here is that until we are certain that the  patients are doing what we've asked, it makes no sense to ask them to do more.   Pulmonary f/u can be prn          Each maintenance medication was reviewed in detail including emphasizing most importantly the difference between maintenance and prns and under what circumstances the prns are to be triggered using an action plan format where appropriate.  Total time for H and P, chart review, counseling, reviewing hfa/smi  device(s) and generating customized AVS unique to this office visit / same day charting  > 30 min final summary f/u ov

## 2023-07-27 DIAGNOSIS — D689 Coagulation defect, unspecified: Secondary | ICD-10-CM | POA: Diagnosis not present

## 2023-07-27 DIAGNOSIS — L299 Pruritus, unspecified: Secondary | ICD-10-CM | POA: Diagnosis not present

## 2023-07-27 DIAGNOSIS — N2581 Secondary hyperparathyroidism of renal origin: Secondary | ICD-10-CM | POA: Diagnosis not present

## 2023-07-27 DIAGNOSIS — N186 End stage renal disease: Secondary | ICD-10-CM | POA: Diagnosis not present

## 2023-07-27 DIAGNOSIS — R197 Diarrhea, unspecified: Secondary | ICD-10-CM | POA: Diagnosis not present

## 2023-07-27 DIAGNOSIS — Z992 Dependence on renal dialysis: Secondary | ICD-10-CM | POA: Diagnosis not present

## 2023-07-28 ENCOUNTER — Telehealth: Payer: Self-pay | Admitting: Internal Medicine

## 2023-07-29 DIAGNOSIS — Z992 Dependence on renal dialysis: Secondary | ICD-10-CM | POA: Diagnosis not present

## 2023-07-29 DIAGNOSIS — N186 End stage renal disease: Secondary | ICD-10-CM | POA: Diagnosis not present

## 2023-07-29 DIAGNOSIS — L299 Pruritus, unspecified: Secondary | ICD-10-CM | POA: Diagnosis not present

## 2023-07-29 DIAGNOSIS — R197 Diarrhea, unspecified: Secondary | ICD-10-CM | POA: Diagnosis not present

## 2023-07-29 DIAGNOSIS — N2581 Secondary hyperparathyroidism of renal origin: Secondary | ICD-10-CM | POA: Diagnosis not present

## 2023-07-29 DIAGNOSIS — D689 Coagulation defect, unspecified: Secondary | ICD-10-CM | POA: Diagnosis not present

## 2023-08-01 ENCOUNTER — Telehealth: Payer: Self-pay | Admitting: Internal Medicine

## 2023-08-01 DIAGNOSIS — Z992 Dependence on renal dialysis: Secondary | ICD-10-CM | POA: Diagnosis not present

## 2023-08-01 DIAGNOSIS — R197 Diarrhea, unspecified: Secondary | ICD-10-CM | POA: Diagnosis not present

## 2023-08-01 DIAGNOSIS — D689 Coagulation defect, unspecified: Secondary | ICD-10-CM | POA: Diagnosis not present

## 2023-08-01 DIAGNOSIS — N2581 Secondary hyperparathyroidism of renal origin: Secondary | ICD-10-CM | POA: Diagnosis not present

## 2023-08-01 DIAGNOSIS — N186 End stage renal disease: Secondary | ICD-10-CM | POA: Diagnosis not present

## 2023-08-01 DIAGNOSIS — L299 Pruritus, unspecified: Secondary | ICD-10-CM | POA: Diagnosis not present

## 2023-08-01 NOTE — Telephone Encounter (Signed)
Patient Is trying to return missed call in regards to pft results. Appointment will be made.

## 2023-08-01 NOTE — Telephone Encounter (Signed)
See result note.  

## 2023-08-03 DIAGNOSIS — R197 Diarrhea, unspecified: Secondary | ICD-10-CM | POA: Diagnosis not present

## 2023-08-03 DIAGNOSIS — D689 Coagulation defect, unspecified: Secondary | ICD-10-CM | POA: Diagnosis not present

## 2023-08-03 DIAGNOSIS — Z992 Dependence on renal dialysis: Secondary | ICD-10-CM | POA: Diagnosis not present

## 2023-08-03 DIAGNOSIS — N186 End stage renal disease: Secondary | ICD-10-CM | POA: Diagnosis not present

## 2023-08-03 DIAGNOSIS — L299 Pruritus, unspecified: Secondary | ICD-10-CM | POA: Diagnosis not present

## 2023-08-03 DIAGNOSIS — N2581 Secondary hyperparathyroidism of renal origin: Secondary | ICD-10-CM | POA: Diagnosis not present

## 2023-08-05 DIAGNOSIS — R197 Diarrhea, unspecified: Secondary | ICD-10-CM | POA: Diagnosis not present

## 2023-08-05 DIAGNOSIS — N186 End stage renal disease: Secondary | ICD-10-CM | POA: Diagnosis not present

## 2023-08-05 DIAGNOSIS — D689 Coagulation defect, unspecified: Secondary | ICD-10-CM | POA: Diagnosis not present

## 2023-08-05 DIAGNOSIS — N2581 Secondary hyperparathyroidism of renal origin: Secondary | ICD-10-CM | POA: Diagnosis not present

## 2023-08-05 DIAGNOSIS — Z992 Dependence on renal dialysis: Secondary | ICD-10-CM | POA: Diagnosis not present

## 2023-08-05 DIAGNOSIS — L299 Pruritus, unspecified: Secondary | ICD-10-CM | POA: Diagnosis not present

## 2023-08-07 DIAGNOSIS — N186 End stage renal disease: Secondary | ICD-10-CM | POA: Diagnosis not present

## 2023-08-07 DIAGNOSIS — Z992 Dependence on renal dialysis: Secondary | ICD-10-CM | POA: Diagnosis not present

## 2023-08-07 DIAGNOSIS — L299 Pruritus, unspecified: Secondary | ICD-10-CM | POA: Diagnosis not present

## 2023-08-07 DIAGNOSIS — R197 Diarrhea, unspecified: Secondary | ICD-10-CM | POA: Diagnosis not present

## 2023-08-07 DIAGNOSIS — D689 Coagulation defect, unspecified: Secondary | ICD-10-CM | POA: Diagnosis not present

## 2023-08-07 DIAGNOSIS — N2581 Secondary hyperparathyroidism of renal origin: Secondary | ICD-10-CM | POA: Diagnosis not present

## 2023-08-10 DIAGNOSIS — N2581 Secondary hyperparathyroidism of renal origin: Secondary | ICD-10-CM | POA: Diagnosis not present

## 2023-08-10 DIAGNOSIS — D689 Coagulation defect, unspecified: Secondary | ICD-10-CM | POA: Diagnosis not present

## 2023-08-10 DIAGNOSIS — Z992 Dependence on renal dialysis: Secondary | ICD-10-CM | POA: Diagnosis not present

## 2023-08-10 DIAGNOSIS — L299 Pruritus, unspecified: Secondary | ICD-10-CM | POA: Diagnosis not present

## 2023-08-10 DIAGNOSIS — N186 End stage renal disease: Secondary | ICD-10-CM | POA: Diagnosis not present

## 2023-08-10 DIAGNOSIS — R197 Diarrhea, unspecified: Secondary | ICD-10-CM | POA: Diagnosis not present

## 2023-08-12 DIAGNOSIS — N2581 Secondary hyperparathyroidism of renal origin: Secondary | ICD-10-CM | POA: Diagnosis not present

## 2023-08-12 DIAGNOSIS — D689 Coagulation defect, unspecified: Secondary | ICD-10-CM | POA: Diagnosis not present

## 2023-08-12 DIAGNOSIS — L299 Pruritus, unspecified: Secondary | ICD-10-CM | POA: Diagnosis not present

## 2023-08-12 DIAGNOSIS — R197 Diarrhea, unspecified: Secondary | ICD-10-CM | POA: Diagnosis not present

## 2023-08-12 DIAGNOSIS — Z992 Dependence on renal dialysis: Secondary | ICD-10-CM | POA: Diagnosis not present

## 2023-08-12 DIAGNOSIS — N186 End stage renal disease: Secondary | ICD-10-CM | POA: Diagnosis not present

## 2023-08-14 DIAGNOSIS — L299 Pruritus, unspecified: Secondary | ICD-10-CM | POA: Diagnosis not present

## 2023-08-14 DIAGNOSIS — D689 Coagulation defect, unspecified: Secondary | ICD-10-CM | POA: Diagnosis not present

## 2023-08-14 DIAGNOSIS — N186 End stage renal disease: Secondary | ICD-10-CM | POA: Diagnosis not present

## 2023-08-14 DIAGNOSIS — N2581 Secondary hyperparathyroidism of renal origin: Secondary | ICD-10-CM | POA: Diagnosis not present

## 2023-08-14 DIAGNOSIS — R197 Diarrhea, unspecified: Secondary | ICD-10-CM | POA: Diagnosis not present

## 2023-08-14 DIAGNOSIS — Z992 Dependence on renal dialysis: Secondary | ICD-10-CM | POA: Diagnosis not present

## 2023-08-15 DIAGNOSIS — N186 End stage renal disease: Secondary | ICD-10-CM | POA: Diagnosis not present

## 2023-08-15 DIAGNOSIS — Z992 Dependence on renal dialysis: Secondary | ICD-10-CM | POA: Diagnosis not present

## 2023-08-15 DIAGNOSIS — E1129 Type 2 diabetes mellitus with other diabetic kidney complication: Secondary | ICD-10-CM | POA: Diagnosis not present

## 2023-08-17 DIAGNOSIS — D689 Coagulation defect, unspecified: Secondary | ICD-10-CM | POA: Diagnosis not present

## 2023-08-17 DIAGNOSIS — N2581 Secondary hyperparathyroidism of renal origin: Secondary | ICD-10-CM | POA: Diagnosis not present

## 2023-08-17 DIAGNOSIS — Z992 Dependence on renal dialysis: Secondary | ICD-10-CM | POA: Diagnosis not present

## 2023-08-17 DIAGNOSIS — N186 End stage renal disease: Secondary | ICD-10-CM | POA: Diagnosis not present

## 2023-08-19 DIAGNOSIS — Z992 Dependence on renal dialysis: Secondary | ICD-10-CM | POA: Diagnosis not present

## 2023-08-19 DIAGNOSIS — N2581 Secondary hyperparathyroidism of renal origin: Secondary | ICD-10-CM | POA: Diagnosis not present

## 2023-08-19 DIAGNOSIS — N186 End stage renal disease: Secondary | ICD-10-CM | POA: Diagnosis not present

## 2023-08-19 DIAGNOSIS — D689 Coagulation defect, unspecified: Secondary | ICD-10-CM | POA: Diagnosis not present

## 2023-08-22 DIAGNOSIS — N186 End stage renal disease: Secondary | ICD-10-CM | POA: Diagnosis not present

## 2023-08-22 DIAGNOSIS — Z992 Dependence on renal dialysis: Secondary | ICD-10-CM | POA: Diagnosis not present

## 2023-08-22 DIAGNOSIS — N2581 Secondary hyperparathyroidism of renal origin: Secondary | ICD-10-CM | POA: Diagnosis not present

## 2023-08-22 DIAGNOSIS — D689 Coagulation defect, unspecified: Secondary | ICD-10-CM | POA: Diagnosis not present

## 2023-08-22 DIAGNOSIS — G4733 Obstructive sleep apnea (adult) (pediatric): Secondary | ICD-10-CM | POA: Diagnosis not present

## 2023-08-24 DIAGNOSIS — N2581 Secondary hyperparathyroidism of renal origin: Secondary | ICD-10-CM | POA: Diagnosis not present

## 2023-08-24 DIAGNOSIS — D689 Coagulation defect, unspecified: Secondary | ICD-10-CM | POA: Diagnosis not present

## 2023-08-24 DIAGNOSIS — N186 End stage renal disease: Secondary | ICD-10-CM | POA: Diagnosis not present

## 2023-08-24 DIAGNOSIS — Z992 Dependence on renal dialysis: Secondary | ICD-10-CM | POA: Diagnosis not present

## 2023-08-27 NOTE — Progress Notes (Deleted)
 Charles Hart, male    DOB: 01-01-1956    MRN: [id number]    Brief patient profile:  89 yobm never smoker   referred to pulmonary clinic in [place]  03/26/2021 by Dr   Charles Hart for cough abrupt onset early July 2022 assoc with sob noct but not with adls    History of Present Illness  03/26/2021  Pulmonary/ 1st office eval/ Charles Hart / [facility] Office  Chief Complaint  Patient presents with   Pulmonary Consult    Referred by Dr. Fairy Charles Hart. Pt c/o non prod cough with SOB x 1 month.    Dyspnea:  MMRC1 = can walk nl pace, flat grade, can't hurry or go uphills or steps s sob   Cough: dry less when asleep  Sleep: sob worse  when lie down - can't tell worse on nights when not HD SABA use: none  Using lots of mints/ menthols due to sense of pnds  Rec Pantoprazole  (protonix ) 40 mg   Take  30-60 min before first meal of the day and Pepcid  (famotidine )  20 mg after supper until return to office -  GERD diet reviewed, bed blocks rec  Pay attention to how you do with your breathing when you lie down on Sat night  vs Sun vs Monday Please schedule a follow up office visit in 8 weeks, call sooner if needed with all medications /inhalers/ solutions in hand   Date of Admission: 05/17/2021  2:26 PM Date of Discharge: 05/20/2021  7:02 PM   Discharge Diagnosis: 1. Acute exacerbation of heart failure  2. Chronic systolic and diastolic Heart Failure 3. ESRD on Hemodialysis 4. Hypertension 5. Hypokalemia 6. Obstructive Sleep Apnea 7. Normocytic anemia   05/22/2023  f/u ov/[place] office/Charles Hart re: cough/ doe  restarted  stiolto x month  / on prn gabapentin  (really not taking it nor gerd rx consistently  Chief Complaint  Patient presents with   Upper airway cough syndrome   Dyspnea:  lowe's walking  x 4 aisles and has to stop due to sob  Cough: some dry no pattern  Sleeping: 30 degrees  waking 2- 3 times per week cough / sob   SABA use: none  02: prn during HD / no change on Monday  nights vs any other  Rec Ok to continue Stiolto 2 puffs each am (like high octane fuel) 0k me to stop it when better  Work on inhaler technique:  Pantoprazole  (protonix ) 40 mg   Take  30-60 min before first meal of the day and Pepcid  (famotidine )  20 mg after supper until return to office GERD diet reviewed, bed blocks rec  Increase gabapentin  to 300 mg twice daily until return (take after dialysis) Cxr : CM s infiltrates   - PFT's  07/21/23  FEV1 1.07 (37 % ) ratio 0.61  p 0 % improvement from saba p 0 prior to study with DLCO  15.85 (63%)   and FV curve no significant concavity / sawtooth pattern  and ERV 11% at wt 196     07/24/2023  f/u ov/[place] office/Charles Hart re: severe airflow obst  maint on gabapentin     Chief Complaint  Patient presents with   Cough  Dyspnea: limited more by back than breathing - one day prio to ov could do anything  but back pain comes and goes for years and can't do anything when in pain  Cough: better  Sleeping: no resp cc  SABA use: none  02: none  Rec  08/29/2023  f/u ov/[place] office/Charles Hart re: *** maint on ***  No chief complaint on file.   Dyspnea:  *** Cough: *** Sleeping: ***   resp cc  SABA use: *** 02: ***  Lung cancer screening: ***   No obvious day to day or daytime variability or assoc excess/ purulent sputum or mucus plugs or hemoptysis or cp or chest tightness, subjective wheeze or overt sinus or hb symptoms.    Also denies any obvious fluctuation of symptoms with weather or environmental changes or other aggravating or alleviating factors except as outlined above   No unusual exposure hx or h/o childhood pna/ asthma or knowledge of premature birth.  Current Allergies, Complete Past Medical History, Past Surgical History, Family History, and Social History were reviewed in [employer] record.  ROS  The following are not active complaints unless bolded Hoarseness, sore throat, dysphagia, dental problems,  itching, sneezing,  nasal congestion or discharge of excess mucus or purulent secretions, ear ache,   fever, chills, sweats, unintended wt loss or wt gain, classically pleuritic or exertional cp,  orthopnea pnd or arm/hand swelling  or leg swelling, presyncope, palpitations, abdominal pain, anorexia, nausea, vomiting, diarrhea  or change in bowel habits or change in bladder habits, change in stools or change in urine, dysuria, hematuria,  rash, arthralgias, visual complaints, headache, numbness, weakness or ataxia or problems with walking or coordination,  change in mood or  memory.        No outpatient medications have been marked as taking for the 08/29/23 encounter (Appointment) with Charles Ozell NOVAK, MD.               Past Medical History:  Diagnosis Date   Adenomatous colon polyp 07/02/2011   Last colonoscopy May 06, 2011 by Dr. Toribio Cedar, who recommended repeat colonoscopy in 5 years.    Anemia    Background diabetic retinopathy 04/20/2012   Patient is followed by Dr. Octavia    Cancer [facility])    Kidney   Cardiomyopathy    LV function improved from 2004 to 2008.  Historically, moderately dilated LV with EF 30-40% by 2D echo 08/14/2002.  Mild CAD with severe LV dysfunction by cardiac cath 09/2002.  Normal coronary arteries and normal LV function by cardiac cath 09/19/2006.  A 2-D echo on 04/01/2009 showed mild concentric hypertrophy and normal systolic (LVEF  60-65%) and doppler C/W with grade 1 diastolic dysfunction.   CHF (congestive heart failure) (HCC)    LV function improved from 2004 to 2008.  Historically, moderately dilated LV with EF 30-40% by 2D echo 08/14/2002.  Mild CAD with severe LV dysfunction by cardiac cath 09/2002.  Normal coronary arteries and normal LV function by cardiac cath 09/19/2006.  A 2-D echo on 04/01/2009 showed mild concentric hypertrophy and normal systolic (LVEF  60-65%) and doppler C/W with grade 1 diastolic dysfunction..    Chronic combined systolic and  diastolic congestive heart failure (HCC) 05/21/2010   LV function improved from 2004 to 2008.  Historically, moderately dilated LV with EF 30-40% by 2D echo 08/14/2002.  Mild CAD with severe LV dysfunction by cardiac cath 09/2002.  Normal coronary arteries and normal LV function by cardiac cath 09/19/2006.  A 2-D echo on 04/01/2009 showed mild concentric hypertrophy and normal systolic (LVEF  60-65%) and doppler parameters consistent with abnormal left    CVA (cerebral vascular accident) (HCC) 07/04/2012   MRI of the brain 07/04/2012 showed an acute infarct in the right basal ganglia involving the  anterior putamen, anterior limb internal capsule, and head of the caudate; this measured approximately 2.5 cm in diameter.      Dermatitis    Diabetes mellitus    type 2   DIABETIC PERIPHERAL NEUROPATHY 08/03/2007   Qualifier: Diagnosis of  By: Lanis MD, Christopher     DM neuropathy, painful (HCC)    fingers and right knee   ESRD (end stage renal disease) (HCC)    Stage 4, on hemodailysis x 4 months as of 05-18-18 [facility], tues thurs sat   ESRD (end stage renal disease) on dialysis (HCC)    TTS; [facility] (05/31/2018)   Hearing loss in right ear    Hyperlipidemia    Hypertension    Hypertensive crisis 07/28/2012   Hypertensive urgency 08/20/2014   Myocardial infarction (HCC)  many yrs ago   Nephrotic syndrome 02/18/2013   A 24-hour urine collection 03/04/2013 showed total protein of 5,460 g and creatinine clearance of 80 mL/minute.  Patient was seen by Zelma Gallon at [facility] and a repeat 24-hour urine showed 10,407 mg protein.  Patient underwent kidney biopsy on 05/30/2013; pathology showed advanced diffuse and nodular diabetic nephropathy with vascular changes consistent with long-standing difficult to control hypertension.      Pneumonia 2014 and 2015   Renal insufficiency    Sleep apnea    11/06/20 - hasn't used it in a while        Objective:     Wts  07/24/2023       202 05/22/2023       193   06/10/2022     190   03/31/2022       176   05/26/21 200 lb 1.9 oz (90.8 kg)  05/20/21 194 lb 7.1 oz (88.2 kg)  05/06/21 205 lb 14.4 oz (93.4 kg)    Vital signs reviewed  08/29/2023  - Note at rest 02 sats  ***% on ***   General appearance:    ***        Assessment

## 2023-08-28 ENCOUNTER — Telehealth: Payer: Self-pay | Admitting: Internal Medicine

## 2023-08-28 NOTE — Telephone Encounter (Signed)
 Pt states he has now moved and misspelled the following medication in his move.  Pt is also requesting his prescriptions be sent to the new pharmacy listed below.     gabapentin  (NEURONTIN ) 300 MG capsule  Store [phone]  [facility] at    [address] [place], [place] [address] [place] [address]  [phone]

## 2023-08-29 ENCOUNTER — Ambulatory Visit: Payer: 59 | Admitting: Internal Medicine

## 2023-08-29 DIAGNOSIS — N2581 Secondary hyperparathyroidism of renal origin: Secondary | ICD-10-CM | POA: Diagnosis not present

## 2023-08-29 DIAGNOSIS — N186 End stage renal disease: Secondary | ICD-10-CM | POA: Diagnosis not present

## 2023-08-29 DIAGNOSIS — D689 Coagulation defect, unspecified: Secondary | ICD-10-CM | POA: Diagnosis not present

## 2023-08-29 DIAGNOSIS — Z992 Dependence on renal dialysis: Secondary | ICD-10-CM | POA: Diagnosis not present

## 2023-08-31 DIAGNOSIS — D689 Coagulation defect, unspecified: Secondary | ICD-10-CM | POA: Diagnosis not present

## 2023-08-31 DIAGNOSIS — Z992 Dependence on renal dialysis: Secondary | ICD-10-CM | POA: Diagnosis not present

## 2023-08-31 DIAGNOSIS — N186 End stage renal disease: Secondary | ICD-10-CM | POA: Diagnosis not present

## 2023-08-31 DIAGNOSIS — N2581 Secondary hyperparathyroidism of renal origin: Secondary | ICD-10-CM | POA: Diagnosis not present

## 2023-09-02 DIAGNOSIS — N186 End stage renal disease: Secondary | ICD-10-CM | POA: Diagnosis not present

## 2023-09-02 DIAGNOSIS — Z992 Dependence on renal dialysis: Secondary | ICD-10-CM | POA: Diagnosis not present

## 2023-09-02 DIAGNOSIS — D689 Coagulation defect, unspecified: Secondary | ICD-10-CM | POA: Diagnosis not present

## 2023-09-02 DIAGNOSIS — N2581 Secondary hyperparathyroidism of renal origin: Secondary | ICD-10-CM | POA: Diagnosis not present

## 2023-09-05 DIAGNOSIS — Z992 Dependence on renal dialysis: Secondary | ICD-10-CM | POA: Diagnosis not present

## 2023-09-05 DIAGNOSIS — N186 End stage renal disease: Secondary | ICD-10-CM | POA: Diagnosis not present

## 2023-09-05 DIAGNOSIS — D689 Coagulation defect, unspecified: Secondary | ICD-10-CM | POA: Diagnosis not present

## 2023-09-05 DIAGNOSIS — N2581 Secondary hyperparathyroidism of renal origin: Secondary | ICD-10-CM | POA: Diagnosis not present

## 2023-09-06 ENCOUNTER — Encounter: Payer: Self-pay | Admitting: Internal Medicine

## 2023-09-06 ENCOUNTER — Ambulatory Visit: Payer: 59 | Admitting: Student

## 2023-09-06 ENCOUNTER — Ambulatory Visit: Payer: 59 | Admitting: Internal Medicine

## 2023-09-06 VITALS — BP 139/77 | HR 79 | Temp 97.1°F | Ht 69.0 in | Wt 204.5 lb

## 2023-09-06 VITALS — BP 128/76 | HR 88 | Ht 69.0 in | Wt 204.2 lb

## 2023-09-06 DIAGNOSIS — E1122 Type 2 diabetes mellitus with diabetic chronic kidney disease: Secondary | ICD-10-CM | POA: Diagnosis not present

## 2023-09-06 DIAGNOSIS — R058 Other specified cough: Secondary | ICD-10-CM | POA: Diagnosis not present

## 2023-09-06 DIAGNOSIS — Z992 Dependence on renal dialysis: Secondary | ICD-10-CM | POA: Diagnosis not present

## 2023-09-06 DIAGNOSIS — I5020 Unspecified systolic (congestive) heart failure: Secondary | ICD-10-CM | POA: Diagnosis not present

## 2023-09-06 DIAGNOSIS — E1140 Type 2 diabetes mellitus with diabetic neuropathy, unspecified: Secondary | ICD-10-CM

## 2023-09-06 DIAGNOSIS — R0609 Other forms of dyspnea: Secondary | ICD-10-CM

## 2023-09-06 DIAGNOSIS — N186 End stage renal disease: Secondary | ICD-10-CM | POA: Diagnosis not present

## 2023-09-06 MED ORDER — STIOLTO RESPIMAT 2.5-2.5 MCG/ACT IN AERS: 2.0000 | INHALATION_SPRAY | Freq: Every day | RESPIRATORY_TRACT | Status: DC

## 2023-09-06 MED ORDER — STIOLTO RESPIMAT 2.5-2.5 MCG/ACT IN AERS: INHALATION_SPRAY | RESPIRATORY_TRACT | 2 refills | Status: DC

## 2023-09-06 NOTE — Patient Instructions (Signed)
Stiolto is 2 puffs each am  - like high octane fuel    If you can't afford it but you really like it, check with you insurance company to see what they will pay for   Please schedule a follow up visit in  6  months but call sooner if needed

## 2023-09-06 NOTE — Assessment & Plan Note (Signed)
>>  ASSESSMENT AND PLAN FOR ESRD (END STAGE RENAL DISEASE) ON DIALYSIS (HCC) WRITTEN ON 09/06/2023  5:39 PM BY NELIA DIRKS, MD  Patient gets dialysis Tuesday Thursday Saturday, has AV fistula in his left upper extremity.  He has been attempting to get a transplant for a few years now, however this seems to have been hindered by his reduced ejection fraction.  Patient states that he underwent a stress test in June 2024, and per cardiology he may be cleared for renal transplant.  He has follow-up with [facility] tomorrow in [place].

## 2023-09-06 NOTE — Assessment & Plan Note (Signed)
Patient continues to be diet controlled for his diabetes.  He brought in his lab work from dialysis centers, and A1c is 4.8.

## 2023-09-06 NOTE — Assessment & Plan Note (Signed)
Onset July 2022 rx with mint/menthol - 03/26/2021 rec no mint/menthol and rx with max gerd rx x 6 weeks then ov > did not follow instructions - 03/31/2022 try again same rx but increase gabapentin to 300 mg bid  and f/u in 4 weeks - 05/22/2023 recurrent off gerd/gabapentin rx > restart  -  07/21/23  f/v loop with no significant  concavity and unusual sawtooth patter  exp > insp   Cough has resolved on present rx though not able to confirm what that is as pt continues to bring meds in with him  - will do pharmacy search x last 3 m and f/u in 6 m sooner if needed Add:  refilling gabapentin 300 mg ? Taking bid> no change in RX         Each maintenance medication was reviewed in detail including emphasizing most importantly the difference between maintenance and prns and under what circumstances the prns are to be triggered using an action plan format where appropriate.  Total time for H and P, chart review, counseling, reviewing hf device(s) and generating customized AVS unique to this office visit / same day charting  > 30 min for multiple  refractory respiratory  symptoms of uncertain etiology

## 2023-09-06 NOTE — Assessment & Plan Note (Addendum)
Onset July 2022  - pt not sure whether better or not berfore or after HD as of 03/26/2021  - 03/26/2021 rec max rx for gerd, observe whether reported noct sob correlates with HD - 03/26/2021   Walked on RA x  3  lap(s) =  approx 150 @ moderate pace, stopped due to end of study with  min sob  with lowest 02 sats 97 - Echo 05/18/21 EF similar to echo done 08/13/20 . Left ventricular ejection fraction, by estimation, is 30 to 35%. The left ventricle has severely decreased  function. The left ventricle demonstrates global hypokinesis. The left  ventricular internal cavity size was  severely dilated. Left ventricular diastolic parameters were normal.   2. Right ventricular systolic function is mildly reduced. The right  ventricular size is mildly enlarged.   3. Left atrial size was moderately dilated.   4. Right atrial size was moderately dilated.   5. The mitral valve is abnormal. Mild to moderate mitral valve  regurgitation. No evidence of mitral stenosis.   6. The aortic valve is tricuspid. Aortic valve regurgitation is trivial.  Mild aortic valve sclerosis is present, with no evidence of aortic valve  stenosis.  [facility]: Left atrial size was moderately dilated. - 05/26/2021   Walked on RA  x  3  lap(s) =  approx 450 ft @ fast pace, stopped due to end of study, min sob last lap with lowest 02 sats 97%   - 03/31/2022    stiolto and pred trial and restart gerd rx  f/u in 4 weeks with all meds in hand  - 06/10/2022  After extensive coaching inhaler device,  effectiveness =    75% with SMI > rechallenge with stiolto x 2 weeks then try off pending /fu pfts > did not do -  05/22/2023   Walked on RA  x  3  lap(s) =  approx 450  ft  @ mod pace, stopped due to end of study with lowest 02 sats 96% with sob but not cp/ cough   - 05/22/2023  After extensive coaching inhaler device,  effectiveness =    75% with respimat from a baseline of near 0 so ok to rechallenge   - PFT's  07/21/23  FEV1 1.07 (37 % ) ratio  0.61  p 0 % improvement from saba p 0 prior to study with DLCO  15.85 (63%)   and FV curve no significant concavity / sawtooth pattern  and ERV 11 at wt 196     >>>>09/06/2023  After extensive coaching inhaler device,  effectiveness =   75% with SMI and pt sure stiolto improved doe so fine to continue it unless having pattern of acute exacerbations in which case symbicort 80 would probably make more sense as this is more a chronic "fixed asthma" than copd clinically.  Discussed in detail all the  indications, usual  risks and alternatives  relative to the benefits with patient who agrees to proceed with Rx as outlined.

## 2023-09-06 NOTE — Patient Instructions (Signed)
Thank you so much for coming to the clinic today!   We will not be making any changes! Glad to hear that you're doing well!  If you have any questions please feel free to the call the clinic at anytime at [phone]. It was a pleasure seeing you!  Best, Dr. Thomasene Ripple

## 2023-09-06 NOTE — Progress Notes (Signed)
Charles Hart, male    DOB: November 07, 1955    MRN: [id number]    Brief patient profile:  73 yobm never smoker   referred to pulmonary clinic in [place]  03/26/2021 by Dr   [name] for cough abrupt onset early July 2022 assoc with sob noct but not with adls  HD   tues thurs sat   x 2015    History of Present Illness  03/26/2021  Pulmonary/ 1st office eval/ Charles Hart / [place] [facility]  Chief Complaint  Patient presents with   Pulmonary Consult    Referred by Dr. Terrial Hart. Pt c/o non prod cough with SOB x 1 month.    Dyspnea:  MMRC1 = can walk nl pace, flat grade, can't hurry or go uphills or steps s sob   Cough: dry less when asleep  Sleep: sob worse  when lie down - can't tell worse on nights when not HD SABA use: none  Using lots of mints/ menthols due to sense of pnds  Rec Pantoprazole (protonix) 40 mg   Take  30-60 min before first meal of the day and Pepcid (famotidine)  20 mg after supper until return to office -  GERD diet reviewed, bed blocks rec  Pay attention to how you do with your breathing when you lie down on Sat night  vs Sun vs Monday Please schedule a follow up office visit in 8 weeks, call sooner if needed with all medications /inhalers/ solutions in hand   Date of Admission: 05/17/2021  2:26 PM Date of Discharge: 05/20/2021  7:02 PM   Discharge Diagnosis: 1. Acute exacerbation of heart failure  2. Chronic systolic and diastolic Heart Failure 3. ESRD on Hemodialysis 4. Hypertension 5. Hypokalemia 6. Obstructive Sleep Apnea 7. Normocytic anemia   05/22/2023  f/u ov/[facility] office/Charles Hart re: cough/ doe  restarted  stiolto x month  / on prn gabapentin (really not taking it nor gerd rx consistently  Chief Complaint  Patient presents with   Upper airway cough syndrome   Dyspnea:  lowe's walking  x 4 aisles and has to stop due to sob  Cough: some dry no pattern  Sleeping: 30 degrees  waking 2- 3 times per week cough / sob   SABA use: none  02: prn  during HD / no change on Monday nights vs any other  Rec Ok to continue [employer] 2 puffs each am (like high octane fuel) 0k me to stop it when better  Work on inhaler technique:  Pantoprazole (protonix) 40 mg   Take  30-60 min before first meal of the day and Pepcid (famotidine)  20 mg after supper until return to office GERD diet reviewed, bed blocks rec  Increase gabapentin to 300 mg twice daily until return (take after dialysis) Cxr : CM s infiltrates   - PFT's  07/21/23  FEV1 1.07 (37 % ) ratio 0.61  p 0 % improvement from saba p 0 prior to study with DLCO  15.85 (63%)   and FV curve no significant concavity / sawtooth pattern  and ERV 11% at wt 196     07/24/2023  f/u ov/[name] office/Charles Hart re: severe airflow obst  maint on gabapentin    Chief Complaint  Patient presents with   Cough  Dyspnea: limited more by back than breathing - one day prior  to ov could do anything  but back pain comes and goes for years and "can't do anything" when in pain  Cough: better  Sleeping: no  resp cc  SABA use: none  02: none  Rec If problems wih breathing or cough please return  with all medications /inhalers/ solutions in hand   09/06/2023  f/u ov/[name] office/Charles Hart re: severe airflow obst maint off stiolto/ did not bring any meds   Chief Complaint  Patient presents with   Follow-up    Pt states he been doing well and still is using Cpap at night here for PFT results  Dyspnea: better while on stiolto  Cough: resolved  on gabapentin / documented hs is on 300 mg pills refilled at pharmacy as recently as 09/04/23 and supposed to be taking bid  Sleeping:  hob up 45 degrees s  resp cc  SABA use: none  02: none    No obvious day to day or daytime variability or assoc excess/ purulent sputum or mucus plugs or hemoptysis or cp or chest tightness, subjective wheeze or overt sinus or hb symptoms.    Also denies any obvious fluctuation of symptoms with weather or environmental changes or other  aggravating or alleviating factors except as outlined above   No unusual exposure hx or h/o childhood pna/ asthma or knowledge of premature birth.  Current Allergies, Complete Past Medical History, Past Surgical History, Family History, and Social History were reviewed in [employer] record.  ROS  The following are not active complaints unless bolded Hoarseness, sore throat, dysphagia, dental problems, itching, sneezing,  nasal congestion or discharge of excess mucus or purulent secretions, ear ache,   fever, chills, sweats, unintended wt loss or wt gain, classically pleuritic or exertional cp,  orthopnea pnd or arm/hand swelling  or leg swelling, presyncope, palpitations, abdominal pain, anorexia, nausea, vomiting, diarrhea  or change in bowel habits or change in bladder habits, change in stools or change in urine, dysuria, hematuria,  rash, arthralgias, visual complaints, headache, numbness, weakness or ataxia or problems with walking or coordination,  change in mood or  memory.        Current Meds  Medication Sig   acetaminophen (TYLENOL) 325 MG tablet Take 2 tablets (650 mg total) by mouth every 6 (six) hours as needed for mild pain.   albuterol (VENTOLIN HFA) 108 (90 Base) MCG/ACT inhaler Inhale 2 puffs into the lungs every 6 (six) hours as needed for wheezing or shortness of breath.   calcium acetate (PHOSLO) 667 MG capsule Take 2,001 mg by mouth 3 (three) times daily with meals.    carvedilol (COREG) 6.25 MG tablet Take 6.25 mg by mouth 2 (two) times daily.   famotidine (PEPCID) 20 MG tablet One after supper   fluticasone (FLONASE) 50 MCG/ACT nasal spray Place 2 sprays into both nostrils as needed for allergies.   folic acid-vitamin b complex-vitamin c-selenium-zinc (DIALYVITE) 3 MG TABS tablet Take 1 tablet by mouth daily.   furosemide (LASIX) 80 MG tablet Take 1 tablet (80 mg total) by mouth daily.   gabapentin (NEURONTIN) 300 MG capsule Take 1 capsule (300 mg  total) by mouth 2 (two) times daily.   hydrALAZINE (APRESOLINE) 100 MG tablet Take 1 tablet (100 mg total) by mouth 3 (three) times daily.   pantoprazole (PROTONIX) 40 MG tablet One twice daily (take after dialysis)   Tiotropium Bromide-Olodaterol (STIOLTO RESPIMAT) 2.5-2.5 MCG/ACT AERS 2 puffs each am               Past Medical History:  Diagnosis Date   Adenomatous colon polyp 07/02/2011   Last colonoscopy May 06, 2011 by Dr. Rob Bunting,  who recommended repeat colonoscopy in 5 years.    Anemia    Background diabetic retinopathy 04/20/2012   Patient is followed by Dr. Dione Booze    Cancer [facility])    Kidney   Cardiomyopathy    LV function improved from 2004 to 2008.  Historically, moderately dilated LV with EF 30-40% by 2D echo 08/14/2002.  Mild CAD with severe LV dysfunction by cardiac cath 09/2002.  Normal coronary arteries and normal LV function by cardiac cath 09/19/2006.  A 2-D echo on 04/01/2009 showed mild concentric hypertrophy and normal systolic (LVEF  60-65%) and doppler C/W with grade 1 diastolic dysfunction.   CHF (congestive heart failure) (HCC)    LV function improved from 2004 to 2008.  Historically, moderately dilated LV with EF 30-40% by 2D echo 08/14/2002.  Mild CAD with severe LV dysfunction by cardiac cath 09/2002.  Normal coronary arteries and normal LV function by cardiac cath 09/19/2006.  A 2-D echo on 04/01/2009 showed mild concentric hypertrophy and normal systolic (LVEF  60-65%) and doppler C/W with grade 1 diastolic dysfunction..    Chronic combined systolic and diastolic congestive heart failure (HCC) 05/21/2010   LV function improved from 2004 to 2008.  Historically, moderately dilated LV with EF 30-40% by 2D echo 08/14/2002.  Mild CAD with severe LV dysfunction by cardiac cath 09/2002.  Normal coronary arteries and normal LV function by cardiac cath 09/19/2006.  A 2-D echo on 04/01/2009 showed mild concentric hypertrophy and normal systolic (LVEF  60-65%) and doppler  parameters consistent with abnormal left    CVA (cerebral vascular accident) (HCC) 07/04/2012   MRI of the brain 07/04/2012 showed an acute infarct in the right basal ganglia involving the anterior putamen, anterior limb internal capsule, and head of the caudate; this measured approximately 2.5 cm in diameter.      Dermatitis    Diabetes mellitus    type 2   DIABETIC PERIPHERAL NEUROPATHY 08/03/2007   Qualifier: Diagnosis of  By: Meredith Pel MD, Dorene Sorrow     DM neuropathy, painful (HCC)    fingers and right knee   ESRD (end stage renal disease) (HCC)    Stage 4, on hemodailysis x 4 months as of 05-18-18 [facility], tues thurs sat   ESRD (end stage renal disease) on dialysis (HCC)    "TTS; [facility]" (05/31/2018)   Hearing loss in right ear    Hyperlipidemia    Hypertension    Hypertensive crisis 07/28/2012   Hypertensive urgency 08/20/2014   Myocardial infarction [facility])  many yrs ago   Nephrotic syndrome 02/18/2013   A 24-hour urine collection 03/04/2013 showed total protein of 5,460 g and creatinine clearance of 80 mL/minute.  Patient was seen by Loma Sender at [facility] and a repeat 24-hour urine showed 10,407 mg protein.  Patient underwent kidney biopsy on 05/30/2013; pathology showed advanced diffuse and nodular diabetic nephropathy with vascular changes consistent with long-standing difficult to control hypertension.      Pneumonia 2014 and 2015   Renal insufficiency    Sleep apnea    11/06/20 - hasn't used it "in a while"        Objective:    Wts  07/24/2023       202 05/22/2023       193   06/10/2022     190   03/31/2022       176   05/26/21 200 lb 1.9 oz (90.8 kg)  05/20/21 194 lb 7.1 oz (88.2 kg)  05/06/21 205  lb 14.4 oz (93.4 kg)    Vital signs reviewed  09/06/2023  - Note at rest 02 sats  96% on RA   General appearance:    amb mod obese somber bm nad   HEENT : Oropharynx  clear      Nasal turbinates nl    NECK :  without  apparent  JVD/ palpable Nodes/TM    LUNGS: no acc muscle use,  Nl contour chest which is clear to A and P bilaterally without cough on insp or exp maneuvers   CV:  RRR  no s3 or murmur or increase in P2, and no edema   ABD:  soft and nontender   MS:  Gait nl   ext warm without deformities Or obvious joint restrictions  calf tenderness, cyanosis or clubbing    SKIN: warm and dry without lesions    NEURO:  alert, approp, nl sensorium with  no motor or cerebellar deficits apparent.         Assessment

## 2023-09-06 NOTE — Progress Notes (Signed)
CC: Checkup  HPI:  Charles Hart is a 68 y.o. male living with a history stated below and presents today for checkup. Please see problem based assessment and plan for additional details.  Past Medical History:  Diagnosis Date   Adenomatous colon polyp 07/02/2011   Last colonoscopy May 06, 2011 by Dr. Rob Bunting, who recommended repeat colonoscopy in 5 years.    Anemia    Background diabetic retinopathy 04/20/2012   Patient is followed by Dr. Dione Booze    Cancer [facility])    Kidney   Cardiomyopathy    LV function improved from 2004 to 2008.  Historically, moderately dilated LV with EF 30-40% by 2D echo 08/14/2002.  Mild CAD with severe LV dysfunction by cardiac cath 09/2002.  Normal coronary arteries and normal LV function by cardiac cath 09/19/2006.  A 2-D echo on 04/01/2009 showed mild concentric hypertrophy and normal systolic (LVEF  60-65%) and doppler C/W with grade 1 diastolic dysfunction.   CHF (congestive heart failure) (HCC)    LV function improved from 2004 to 2008.  Historically, moderately dilated LV with EF 30-40% by 2D echo 08/14/2002.  Mild CAD with severe LV dysfunction by cardiac cath 09/2002.  Normal coronary arteries and normal LV function by cardiac cath 09/19/2006.  A 2-D echo on 04/01/2009 showed mild concentric hypertrophy and normal systolic (LVEF  60-65%) and doppler C/W with grade 1 diastolic dysfunction..    Chronic combined systolic and diastolic congestive heart failure (HCC) 05/21/2010   LV function improved from 2004 to 2008.  Historically, moderately dilated LV with EF 30-40% by 2D echo 08/14/2002.  Mild CAD with severe LV dysfunction by cardiac cath 09/2002.  Normal coronary arteries and normal LV function by cardiac cath 09/19/2006.  A 2-D echo on 04/01/2009 showed mild concentric hypertrophy and normal systolic (LVEF  60-65%) and doppler parameters consistent with abnormal left    CVA (cerebral vascular accident) (HCC) 07/04/2012   MRI of the brain  07/04/2012 showed an acute infarct in the right basal ganglia involving the anterior putamen, anterior limb internal capsule, and head of the caudate; this measured approximately 2.5 cm in diameter.      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   Qualifier: Diagnosis of  By: Meredith Pel MD, Dorene Sorrow     DM neuropathy, painful (HCC)    fingers and right knee   ESRD (end stage renal disease) (HCC)    Stage 4, on hemodailysis x 4 months as of 05-18-18  [facility], tues thurs sat   ESRD (end stage renal disease) on dialysis (HCC)    "TTS; [facility]" (05/31/2018)   Hearing loss in right ear    Hyperlipidemia    Hypertension    Hypertensive crisis 07/28/2012   Hypertensive urgency 08/20/2014   Myocardial infarction [facility])  many yrs ago   Nephrotic syndrome 02/18/2013   A 24-hour urine collection 03/04/2013 showed total protein of 5,460 g and creatinine clearance of 80 mL/minute.  Patient was seen by Loma Sender at [facility] and a repeat 24-hour urine showed 10,407 mg protein.  Patient underwent kidney biopsy on 05/30/2013; pathology showed advanced diffuse and nodular diabetic nephropathy with vascular changes consistent with long-standing difficult to control hypertension.      Pneumonia 2014 and 2015   Renal insufficiency    Sleep apnea    11/06/20 - hasn't used it "in  a while"   Surgical pneumoperitoneum    Trochanteric bursitis of right hip 04/25/2018    Current Outpatient Medications on File Prior to Visit  Medication Sig Dispense Refill   acetaminophen (TYLENOL) 325 MG tablet Take 2 tablets (650 mg total) by mouth every 6 (six) hours as needed for mild pain.     albuterol (VENTOLIN HFA) 108 (90 Base) MCG/ACT inhaler Inhale 2 puffs into the lungs every 6 (six) hours as needed for wheezing or shortness of breath. 8 g 0   calcium acetate (PHOSLO) 667 MG capsule Take 2,001  mg by mouth 3 (three) times daily with meals.      carvedilol (COREG) 6.25 MG tablet Take 6.25 mg by mouth 2 (two) times daily.     famotidine (PEPCID) 20 MG tablet One after supper 30 tablet 11   fluticasone (FLONASE) 50 MCG/ACT nasal spray Place 2 sprays into both nostrils as needed for allergies. 16 g 1   folic acid-vitamin b complex-vitamin c-selenium-zinc (DIALYVITE) 3 MG TABS tablet Take 1 tablet by mouth daily.     furosemide (LASIX) 80 MG tablet Take 1 tablet (80 mg total) by mouth daily. 90 tablet 2   gabapentin (NEURONTIN) 300 MG capsule Take 1 capsule (300 mg total) by mouth 2 (two) times daily. 60 capsule 2   hydrALAZINE (APRESOLINE) 100 MG tablet Take 1 tablet (100 mg total) by mouth 3 (three) times daily. 90 tablet 2   pantoprazole (PROTONIX) 40 MG tablet One twice daily (take after dialysis) 30 tablet 0   pravastatin (PRAVACHOL) 40 MG tablet Take 1 tablet (40 mg total) by mouth every evening. 90 tablet 3   No current facility-administered medications on file prior to visit.    Family History  Problem Relation Age of Onset   Aneurysm Father 23       died of rupture   Colon cancer Sister     Social History   Socioeconomic History   Marital status: Divorced    Spouse name: Not on file   Number of children: Not on file   Years of education: Not on file   Highest education level: Not on file  Occupational History   Not on file  Tobacco Use   Smoking status: Never   Smokeless tobacco: Never  Vaping Use   Vaping status: Never Used  Substance and Sexual Activity   Alcohol use: Not Currently   Drug use: No   Sexual activity: Not on file  Other Topics Concern   Not on file  Social History Narrative   Not on file   Social Drivers of Health   Financial Resource Strain: Medium Risk (11/24/2022)   Overall Financial Resource Strain (CARDIA)    Difficulty of Paying Living Expenses: Somewhat hard  Food Insecurity: No Food Insecurity (11/24/2022)   Hunger Vital Sign     Worried About Running Out of Food in the Last Year: Never true    Ran Out of Food in the Last Year: Never true  Transportation Needs: No Transportation Needs (11/24/2022)   PRAPARE - Administrator, Civil Service (Medical): No    Lack of Transportation (Non-Medical): No  Physical Activity: Inactive (11/24/2022)   Exercise Vital Sign    Days of Exercise per Week: 0 days    Minutes of Exercise per Session: 0 min  Stress: No Stress Concern Present (11/24/2022)   [employer] of Occupational Health - Occupational Stress Questionnaire    Feeling of Stress : Not at all  Social Connections: Moderately Isolated (11/24/2022)   Social Connection and Isolation Panel [NHANES]    Frequency of Communication with Friends and Family: More than three times a week    Frequency of Social Gatherings with Friends and Family: More than three times a week    Attends Religious Services: More than 4 times per year    Active Member of [employer] or Organizations: No    Attends Banker Meetings: Never    Marital Status: Divorced  Catering manager Violence: Not At Risk (11/24/2022)   Humiliation, Afraid, Rape, and Kick questionnaire    Fear of Current or Ex-Partner: No    Emotionally Abused: No    Physically Abused: No    Sexually Abused: No    Review of Systems: ROS negative except for what is noted on the assessment and plan.  Vitals:   09/06/23 1603 09/06/23 1610  BP: (!) 141/78 139/77  Pulse: 82 79  Temp: (!) 97.1 F (36.2 C)   TempSrc: Oral   SpO2: 98%   Weight: 204 lb 8 oz (92.8 kg)   Height: 5\' 9"  (1.753 m)     Physical Exam: Constitutional: well-appearing male  in no acute distress Cardiovascular: regular rate and rhythm, no m/r/g Pulmonary/Chest: normal work of breathing on room air, lungs clear to auscultation bilaterally Abdominal: soft, non-tender, non-distended Skin: warm and dry, AV fistula in LUE   Assessment & Plan:   Heart failure with reduced ejection  fraction (HCC) Last ejection fraction from earlier this month showed an EF of 20%, with global hypokinesis of the left ventricle, and moderate pulmonary hypertension.  He follows with cardiology per [facility].  Will need GDMT management that I can see is Coreg 6.25 mg twice a day.  On my exam, he is not volume overloaded, and did not appreciate any rales on auscultation.  Plan: - Continue Coreg 6.25 mg twice a day  Type 2 diabetes mellitus with diabetic neuropathy (HCC) Patient continues to be diet controlled for his diabetes.  He brought in his lab work from dialysis centers, and A1c is 4.8.  ESRD (end stage renal disease) on dialysis [facility]) Patient gets dialysis Tuesday Thursday Saturday, has AV fistula in his left upper extremity.  He has been attempting to get a transplant for a few years now, however this seems to have been hindered by his reduced ejection fraction.  Patient states that he underwent a stress test in June 2024, and per cardiology he may be cleared for renal transplant.  He has follow-up with [facility] tomorrow in [place].  Patient discussed with Dr. Maryagnes Amos Joselle Deeds, M.D. [facility] Internal Medicine, PGY-2 Pager: [phone] Date 09/06/2023 Time 5:39 PM

## 2023-09-06 NOTE — Assessment & Plan Note (Signed)
Last ejection fraction from earlier this month showed an EF of 20%, with global hypokinesis of the left ventricle, and moderate pulmonary hypertension.  He follows with cardiology per [facility].  Will need GDMT management that I can see is Coreg 6.25 mg twice a day.  On my exam, he is not volume overloaded, and did not appreciate any rales on auscultation.  Plan: - Continue Coreg 6.25 mg twice a day

## 2023-09-06 NOTE — Assessment & Plan Note (Signed)
Patient gets dialysis Tuesday Thursday Saturday, has AV fistula in his left upper extremity.  He has been attempting to get a transplant for a few years now, however this seems to have been hindered by his reduced ejection fraction.  Patient states that he underwent a stress test in June 2024, and per cardiology he may be cleared for renal transplant.  He has [facility] tomorrow in [place].

## 2023-09-07 DIAGNOSIS — D689 Coagulation defect, unspecified: Secondary | ICD-10-CM | POA: Diagnosis not present

## 2023-09-07 DIAGNOSIS — Z992 Dependence on renal dialysis: Secondary | ICD-10-CM | POA: Diagnosis not present

## 2023-09-07 DIAGNOSIS — N2581 Secondary hyperparathyroidism of renal origin: Secondary | ICD-10-CM | POA: Diagnosis not present

## 2023-09-07 DIAGNOSIS — N186 End stage renal disease: Secondary | ICD-10-CM | POA: Diagnosis not present

## 2023-09-07 NOTE — Progress Notes (Signed)
 Internal Medicine Clinic Attending  Case discussed with the resident physician at the time of the visit.  We reviewed the patient's history, exam, and pertinent patient test results.  I agree with the assessment, diagnosis, and plan of care documented in the resident's note.

## 2023-09-09 DIAGNOSIS — Z992 Dependence on renal dialysis: Secondary | ICD-10-CM | POA: Diagnosis not present

## 2023-09-09 DIAGNOSIS — N186 End stage renal disease: Secondary | ICD-10-CM | POA: Diagnosis not present

## 2023-09-09 DIAGNOSIS — D689 Coagulation defect, unspecified: Secondary | ICD-10-CM | POA: Diagnosis not present

## 2023-09-09 DIAGNOSIS — N2581 Secondary hyperparathyroidism of renal origin: Secondary | ICD-10-CM | POA: Diagnosis not present

## 2023-09-12 DIAGNOSIS — N2581 Secondary hyperparathyroidism of renal origin: Secondary | ICD-10-CM | POA: Diagnosis not present

## 2023-09-12 DIAGNOSIS — N186 End stage renal disease: Secondary | ICD-10-CM | POA: Diagnosis not present

## 2023-09-12 DIAGNOSIS — Z992 Dependence on renal dialysis: Secondary | ICD-10-CM | POA: Diagnosis not present

## 2023-09-12 DIAGNOSIS — D689 Coagulation defect, unspecified: Secondary | ICD-10-CM | POA: Diagnosis not present

## 2023-09-14 DIAGNOSIS — I5020 Unspecified systolic (congestive) heart failure: Secondary | ICD-10-CM | POA: Diagnosis not present

## 2023-09-16 DIAGNOSIS — Z992 Dependence on renal dialysis: Secondary | ICD-10-CM | POA: Diagnosis not present

## 2023-09-16 DIAGNOSIS — D689 Coagulation defect, unspecified: Secondary | ICD-10-CM | POA: Diagnosis not present

## 2023-09-16 DIAGNOSIS — N186 End stage renal disease: Secondary | ICD-10-CM | POA: Diagnosis not present

## 2023-09-16 DIAGNOSIS — N2581 Secondary hyperparathyroidism of renal origin: Secondary | ICD-10-CM | POA: Diagnosis not present

## 2023-09-19 DIAGNOSIS — N186 End stage renal disease: Secondary | ICD-10-CM | POA: Diagnosis not present

## 2023-09-19 DIAGNOSIS — Z992 Dependence on renal dialysis: Secondary | ICD-10-CM | POA: Diagnosis not present

## 2023-09-19 DIAGNOSIS — N2581 Secondary hyperparathyroidism of renal origin: Secondary | ICD-10-CM | POA: Diagnosis not present

## 2023-09-19 DIAGNOSIS — D689 Coagulation defect, unspecified: Secondary | ICD-10-CM | POA: Diagnosis not present

## 2023-09-21 DIAGNOSIS — N2581 Secondary hyperparathyroidism of renal origin: Secondary | ICD-10-CM | POA: Diagnosis not present

## 2023-09-21 DIAGNOSIS — Z992 Dependence on renal dialysis: Secondary | ICD-10-CM | POA: Diagnosis not present

## 2023-09-21 DIAGNOSIS — D689 Coagulation defect, unspecified: Secondary | ICD-10-CM | POA: Diagnosis not present

## 2023-09-21 DIAGNOSIS — N186 End stage renal disease: Secondary | ICD-10-CM | POA: Diagnosis not present

## 2023-09-23 DIAGNOSIS — N2581 Secondary hyperparathyroidism of renal origin: Secondary | ICD-10-CM | POA: Diagnosis not present

## 2023-09-23 DIAGNOSIS — D689 Coagulation defect, unspecified: Secondary | ICD-10-CM | POA: Diagnosis not present

## 2023-09-23 DIAGNOSIS — N186 End stage renal disease: Secondary | ICD-10-CM | POA: Diagnosis not present

## 2023-09-23 DIAGNOSIS — Z992 Dependence on renal dialysis: Secondary | ICD-10-CM | POA: Diagnosis not present

## 2023-09-26 ENCOUNTER — Other Ambulatory Visit: Payer: Self-pay

## 2023-09-26 DIAGNOSIS — Z992 Dependence on renal dialysis: Secondary | ICD-10-CM | POA: Diagnosis not present

## 2023-09-26 DIAGNOSIS — D689 Coagulation defect, unspecified: Secondary | ICD-10-CM | POA: Diagnosis not present

## 2023-09-26 DIAGNOSIS — N186 End stage renal disease: Secondary | ICD-10-CM | POA: Diagnosis not present

## 2023-09-26 DIAGNOSIS — N2581 Secondary hyperparathyroidism of renal origin: Secondary | ICD-10-CM | POA: Diagnosis not present

## 2023-09-26 MED ORDER — PRAVASTATIN SODIUM 40 MG PO TABS: 40.0000 mg | ORAL_TABLET | Freq: Every evening | ORAL | 3 refills | Status: DC

## 2023-09-28 DIAGNOSIS — N2581 Secondary hyperparathyroidism of renal origin: Secondary | ICD-10-CM | POA: Diagnosis not present

## 2023-09-28 DIAGNOSIS — N186 End stage renal disease: Secondary | ICD-10-CM | POA: Diagnosis not present

## 2023-09-28 DIAGNOSIS — D689 Coagulation defect, unspecified: Secondary | ICD-10-CM | POA: Diagnosis not present

## 2023-09-28 DIAGNOSIS — Z992 Dependence on renal dialysis: Secondary | ICD-10-CM | POA: Diagnosis not present

## 2023-09-30 DIAGNOSIS — N186 End stage renal disease: Secondary | ICD-10-CM | POA: Diagnosis not present

## 2023-09-30 DIAGNOSIS — D689 Coagulation defect, unspecified: Secondary | ICD-10-CM | POA: Diagnosis not present

## 2023-09-30 DIAGNOSIS — N2581 Secondary hyperparathyroidism of renal origin: Secondary | ICD-10-CM | POA: Diagnosis not present

## 2023-09-30 DIAGNOSIS — Z992 Dependence on renal dialysis: Secondary | ICD-10-CM | POA: Diagnosis not present

## 2023-10-03 DIAGNOSIS — Z992 Dependence on renal dialysis: Secondary | ICD-10-CM | POA: Diagnosis not present

## 2023-10-03 DIAGNOSIS — D689 Coagulation defect, unspecified: Secondary | ICD-10-CM | POA: Diagnosis not present

## 2023-10-03 DIAGNOSIS — N2581 Secondary hyperparathyroidism of renal origin: Secondary | ICD-10-CM | POA: Diagnosis not present

## 2023-10-03 DIAGNOSIS — N186 End stage renal disease: Secondary | ICD-10-CM | POA: Diagnosis not present

## 2023-10-05 DIAGNOSIS — D689 Coagulation defect, unspecified: Secondary | ICD-10-CM | POA: Diagnosis not present

## 2023-10-05 DIAGNOSIS — Z992 Dependence on renal dialysis: Secondary | ICD-10-CM | POA: Diagnosis not present

## 2023-10-05 DIAGNOSIS — N186 End stage renal disease: Secondary | ICD-10-CM | POA: Diagnosis not present

## 2023-10-05 DIAGNOSIS — N2581 Secondary hyperparathyroidism of renal origin: Secondary | ICD-10-CM | POA: Diagnosis not present

## 2023-10-07 DIAGNOSIS — D689 Coagulation defect, unspecified: Secondary | ICD-10-CM | POA: Diagnosis not present

## 2023-10-07 DIAGNOSIS — N2581 Secondary hyperparathyroidism of renal origin: Secondary | ICD-10-CM | POA: Diagnosis not present

## 2023-10-07 DIAGNOSIS — N186 End stage renal disease: Secondary | ICD-10-CM | POA: Diagnosis not present

## 2023-10-07 DIAGNOSIS — Z992 Dependence on renal dialysis: Secondary | ICD-10-CM | POA: Diagnosis not present

## 2023-10-10 DIAGNOSIS — Z992 Dependence on renal dialysis: Secondary | ICD-10-CM | POA: Diagnosis not present

## 2023-10-10 DIAGNOSIS — N2581 Secondary hyperparathyroidism of renal origin: Secondary | ICD-10-CM | POA: Diagnosis not present

## 2023-10-10 DIAGNOSIS — D689 Coagulation defect, unspecified: Secondary | ICD-10-CM | POA: Diagnosis not present

## 2023-10-10 DIAGNOSIS — N186 End stage renal disease: Secondary | ICD-10-CM | POA: Diagnosis not present

## 2023-10-12 DIAGNOSIS — N186 End stage renal disease: Secondary | ICD-10-CM | POA: Diagnosis not present

## 2023-10-12 DIAGNOSIS — D689 Coagulation defect, unspecified: Secondary | ICD-10-CM | POA: Diagnosis not present

## 2023-10-12 DIAGNOSIS — Z992 Dependence on renal dialysis: Secondary | ICD-10-CM | POA: Diagnosis not present

## 2023-10-12 DIAGNOSIS — N2581 Secondary hyperparathyroidism of renal origin: Secondary | ICD-10-CM | POA: Diagnosis not present

## 2023-10-13 DIAGNOSIS — Z992 Dependence on renal dialysis: Secondary | ICD-10-CM | POA: Diagnosis not present

## 2023-10-13 DIAGNOSIS — E1129 Type 2 diabetes mellitus with other diabetic kidney complication: Secondary | ICD-10-CM | POA: Diagnosis not present

## 2023-10-13 DIAGNOSIS — N186 End stage renal disease: Secondary | ICD-10-CM | POA: Diagnosis not present

## 2023-10-14 DIAGNOSIS — Z992 Dependence on renal dialysis: Secondary | ICD-10-CM | POA: Diagnosis not present

## 2023-10-14 DIAGNOSIS — D689 Coagulation defect, unspecified: Secondary | ICD-10-CM | POA: Diagnosis not present

## 2023-10-14 DIAGNOSIS — N186 End stage renal disease: Secondary | ICD-10-CM | POA: Diagnosis not present

## 2023-10-14 DIAGNOSIS — N2581 Secondary hyperparathyroidism of renal origin: Secondary | ICD-10-CM | POA: Diagnosis not present

## 2023-10-16 ENCOUNTER — Other Ambulatory Visit: Payer: Self-pay

## 2023-10-16 ENCOUNTER — Other Ambulatory Visit: Payer: Self-pay | Admitting: *Deleted

## 2023-10-16 DIAGNOSIS — I428 Other cardiomyopathies: Secondary | ICD-10-CM | POA: Diagnosis not present

## 2023-10-16 DIAGNOSIS — I5020 Unspecified systolic (congestive) heart failure: Secondary | ICD-10-CM | POA: Diagnosis not present

## 2023-10-16 DIAGNOSIS — N186 End stage renal disease: Secondary | ICD-10-CM | POA: Diagnosis not present

## 2023-10-16 DIAGNOSIS — I420 Dilated cardiomyopathy: Secondary | ICD-10-CM | POA: Diagnosis not present

## 2023-10-16 NOTE — Telephone Encounter (Signed)
 error

## 2023-10-17 DIAGNOSIS — Z992 Dependence on renal dialysis: Secondary | ICD-10-CM | POA: Diagnosis not present

## 2023-10-17 DIAGNOSIS — N2581 Secondary hyperparathyroidism of renal origin: Secondary | ICD-10-CM | POA: Diagnosis not present

## 2023-10-17 DIAGNOSIS — N186 End stage renal disease: Secondary | ICD-10-CM | POA: Diagnosis not present

## 2023-10-17 DIAGNOSIS — D689 Coagulation defect, unspecified: Secondary | ICD-10-CM | POA: Diagnosis not present

## 2023-10-17 NOTE — Telephone Encounter (Signed)
 Both of these are prescribed by other doctors, Gabapentin is prescribed by dr wert/ pulm, and phoslo by nephrology, he should likely try calling the pharmacy for refills.

## 2023-10-17 NOTE — Telephone Encounter (Signed)
 Pt was called; no answer;left message on self-identified vm to call Dr Sherene Sires and nephrology for Gabapentin and calcium acetate refills.

## 2023-10-19 DIAGNOSIS — D689 Coagulation defect, unspecified: Secondary | ICD-10-CM | POA: Diagnosis not present

## 2023-10-19 DIAGNOSIS — N2581 Secondary hyperparathyroidism of renal origin: Secondary | ICD-10-CM | POA: Diagnosis not present

## 2023-10-19 DIAGNOSIS — Z992 Dependence on renal dialysis: Secondary | ICD-10-CM | POA: Diagnosis not present

## 2023-10-19 DIAGNOSIS — N186 End stage renal disease: Secondary | ICD-10-CM | POA: Diagnosis not present

## 2023-10-21 DIAGNOSIS — N2581 Secondary hyperparathyroidism of renal origin: Secondary | ICD-10-CM | POA: Diagnosis not present

## 2023-10-21 DIAGNOSIS — N186 End stage renal disease: Secondary | ICD-10-CM | POA: Diagnosis not present

## 2023-10-21 DIAGNOSIS — D689 Coagulation defect, unspecified: Secondary | ICD-10-CM | POA: Diagnosis not present

## 2023-10-21 DIAGNOSIS — Z992 Dependence on renal dialysis: Secondary | ICD-10-CM | POA: Diagnosis not present

## 2023-10-23 DIAGNOSIS — D689 Coagulation defect, unspecified: Secondary | ICD-10-CM | POA: Diagnosis not present

## 2023-10-23 DIAGNOSIS — N2581 Secondary hyperparathyroidism of renal origin: Secondary | ICD-10-CM | POA: Diagnosis not present

## 2023-10-23 DIAGNOSIS — Z992 Dependence on renal dialysis: Secondary | ICD-10-CM | POA: Diagnosis not present

## 2023-10-23 DIAGNOSIS — N186 End stage renal disease: Secondary | ICD-10-CM | POA: Diagnosis not present

## 2023-10-24 DIAGNOSIS — D689 Coagulation defect, unspecified: Secondary | ICD-10-CM | POA: Diagnosis not present

## 2023-10-24 DIAGNOSIS — N2581 Secondary hyperparathyroidism of renal origin: Secondary | ICD-10-CM | POA: Diagnosis not present

## 2023-10-24 DIAGNOSIS — N186 End stage renal disease: Secondary | ICD-10-CM | POA: Diagnosis not present

## 2023-10-24 DIAGNOSIS — Z992 Dependence on renal dialysis: Secondary | ICD-10-CM | POA: Diagnosis not present

## 2023-10-24 MED ORDER — FUROSEMIDE 80 MG PO TABS: 80.0000 mg | ORAL_TABLET | Freq: Every day | ORAL | 0 refills | Status: DC

## 2023-10-24 NOTE — Addendum Note (Signed)
 Addended by: Hassan Buckler on: 10/24/2023 01:36 PM   Modules accepted: Orders

## 2023-10-24 NOTE — Telephone Encounter (Signed)
 Return call from pt requesting a requesting a refill on Gabapentin and Furosemide. Pt stated Dr Mikey Bussing had prescribed Gabapentin before Dr Sherene Sires. Pt's upset; wants to know why he can't get his medications. Wanting to speak to Dr Mikey Bussing. I told pt I will send his concerns to Dr Mikey Bussing.

## 2023-10-24 NOTE — Telephone Encounter (Signed)
 I called pt - informed of Furosemide refill. Also informed of Dr Neita Garnet response; he stated "he was the one who put me on 300 mg Before dialysis". I repeated "His gabapentin is above the recommended dose for someone ON dialysis". And to call Dr Sherene Sires - pt stated ok.

## 2023-10-24 NOTE — Telephone Encounter (Signed)
 His gabapentin dose is above the recommended dose for someone on dialysis, if he wants to continue that dose I recommend he continue to follow up with dr wert.

## 2023-10-26 DIAGNOSIS — Z992 Dependence on renal dialysis: Secondary | ICD-10-CM | POA: Diagnosis not present

## 2023-10-26 DIAGNOSIS — N2581 Secondary hyperparathyroidism of renal origin: Secondary | ICD-10-CM | POA: Diagnosis not present

## 2023-10-26 DIAGNOSIS — N186 End stage renal disease: Secondary | ICD-10-CM | POA: Diagnosis not present

## 2023-10-26 DIAGNOSIS — D689 Coagulation defect, unspecified: Secondary | ICD-10-CM | POA: Diagnosis not present

## 2023-10-28 DIAGNOSIS — D689 Coagulation defect, unspecified: Secondary | ICD-10-CM | POA: Diagnosis not present

## 2023-10-28 DIAGNOSIS — Z992 Dependence on renal dialysis: Secondary | ICD-10-CM | POA: Diagnosis not present

## 2023-10-28 DIAGNOSIS — N186 End stage renal disease: Secondary | ICD-10-CM | POA: Diagnosis not present

## 2023-10-28 DIAGNOSIS — N2581 Secondary hyperparathyroidism of renal origin: Secondary | ICD-10-CM | POA: Diagnosis not present

## 2023-10-31 DIAGNOSIS — Z992 Dependence on renal dialysis: Secondary | ICD-10-CM | POA: Diagnosis not present

## 2023-10-31 DIAGNOSIS — D689 Coagulation defect, unspecified: Secondary | ICD-10-CM | POA: Diagnosis not present

## 2023-10-31 DIAGNOSIS — N186 End stage renal disease: Secondary | ICD-10-CM | POA: Diagnosis not present

## 2023-10-31 DIAGNOSIS — N2581 Secondary hyperparathyroidism of renal origin: Secondary | ICD-10-CM | POA: Diagnosis not present

## 2023-11-02 DIAGNOSIS — Z992 Dependence on renal dialysis: Secondary | ICD-10-CM | POA: Diagnosis not present

## 2023-11-02 DIAGNOSIS — N2581 Secondary hyperparathyroidism of renal origin: Secondary | ICD-10-CM | POA: Diagnosis not present

## 2023-11-02 DIAGNOSIS — D689 Coagulation defect, unspecified: Secondary | ICD-10-CM | POA: Diagnosis not present

## 2023-11-02 DIAGNOSIS — N186 End stage renal disease: Secondary | ICD-10-CM | POA: Diagnosis not present

## 2023-11-04 DIAGNOSIS — N186 End stage renal disease: Secondary | ICD-10-CM | POA: Diagnosis not present

## 2023-11-04 DIAGNOSIS — Z992 Dependence on renal dialysis: Secondary | ICD-10-CM | POA: Diagnosis not present

## 2023-11-04 DIAGNOSIS — N2581 Secondary hyperparathyroidism of renal origin: Secondary | ICD-10-CM | POA: Diagnosis not present

## 2023-11-04 DIAGNOSIS — D689 Coagulation defect, unspecified: Secondary | ICD-10-CM | POA: Diagnosis not present

## 2023-11-07 DIAGNOSIS — D689 Coagulation defect, unspecified: Secondary | ICD-10-CM | POA: Diagnosis not present

## 2023-11-07 DIAGNOSIS — N186 End stage renal disease: Secondary | ICD-10-CM | POA: Diagnosis not present

## 2023-11-07 DIAGNOSIS — N2581 Secondary hyperparathyroidism of renal origin: Secondary | ICD-10-CM | POA: Diagnosis not present

## 2023-11-07 DIAGNOSIS — Z992 Dependence on renal dialysis: Secondary | ICD-10-CM | POA: Diagnosis not present

## 2023-11-09 DIAGNOSIS — Z992 Dependence on renal dialysis: Secondary | ICD-10-CM | POA: Diagnosis not present

## 2023-11-09 DIAGNOSIS — D689 Coagulation defect, unspecified: Secondary | ICD-10-CM | POA: Diagnosis not present

## 2023-11-09 DIAGNOSIS — N2581 Secondary hyperparathyroidism of renal origin: Secondary | ICD-10-CM | POA: Diagnosis not present

## 2023-11-09 DIAGNOSIS — N186 End stage renal disease: Secondary | ICD-10-CM | POA: Diagnosis not present

## 2023-11-11 DIAGNOSIS — Z992 Dependence on renal dialysis: Secondary | ICD-10-CM | POA: Diagnosis not present

## 2023-11-11 DIAGNOSIS — N186 End stage renal disease: Secondary | ICD-10-CM | POA: Diagnosis not present

## 2023-11-11 DIAGNOSIS — N2581 Secondary hyperparathyroidism of renal origin: Secondary | ICD-10-CM | POA: Diagnosis not present

## 2023-11-11 DIAGNOSIS — D689 Coagulation defect, unspecified: Secondary | ICD-10-CM | POA: Diagnosis not present

## 2023-11-13 DIAGNOSIS — N186 End stage renal disease: Secondary | ICD-10-CM | POA: Diagnosis not present

## 2023-11-13 DIAGNOSIS — E1129 Type 2 diabetes mellitus with other diabetic kidney complication: Secondary | ICD-10-CM | POA: Diagnosis not present

## 2023-11-13 DIAGNOSIS — Z992 Dependence on renal dialysis: Secondary | ICD-10-CM | POA: Diagnosis not present

## 2023-11-14 DIAGNOSIS — N186 End stage renal disease: Secondary | ICD-10-CM | POA: Diagnosis not present

## 2023-11-14 DIAGNOSIS — N2581 Secondary hyperparathyroidism of renal origin: Secondary | ICD-10-CM | POA: Diagnosis not present

## 2023-11-14 DIAGNOSIS — Z992 Dependence on renal dialysis: Secondary | ICD-10-CM | POA: Diagnosis not present

## 2023-11-14 DIAGNOSIS — D689 Coagulation defect, unspecified: Secondary | ICD-10-CM | POA: Diagnosis not present

## 2023-11-16 DIAGNOSIS — D689 Coagulation defect, unspecified: Secondary | ICD-10-CM | POA: Diagnosis not present

## 2023-11-16 DIAGNOSIS — N186 End stage renal disease: Secondary | ICD-10-CM | POA: Diagnosis not present

## 2023-11-16 DIAGNOSIS — Z992 Dependence on renal dialysis: Secondary | ICD-10-CM | POA: Diagnosis not present

## 2023-11-16 DIAGNOSIS — N2581 Secondary hyperparathyroidism of renal origin: Secondary | ICD-10-CM | POA: Diagnosis not present

## 2023-11-17 ENCOUNTER — Ambulatory Visit: Admitting: Vascular Surgery

## 2023-11-17 ENCOUNTER — Encounter: Payer: Self-pay | Admitting: Vascular Surgery

## 2023-11-17 ENCOUNTER — Other Ambulatory Visit: Payer: Self-pay

## 2023-11-17 VITALS — BP 150/85 | HR 69 | Temp 98.1°F | Resp 18 | Ht 69.0 in | Wt 210.3 lb

## 2023-11-17 DIAGNOSIS — Z992 Dependence on renal dialysis: Secondary | ICD-10-CM | POA: Diagnosis not present

## 2023-11-17 DIAGNOSIS — N186 End stage renal disease: Secondary | ICD-10-CM

## 2023-11-17 NOTE — Progress Notes (Signed)
 Patient name: Charles Hart MRN: [id number] DOB: 1956-08-14 Sex: male  REASON FOR CONSULT: Large sore on dialysis access that is oozing  HPI: TIMBER LUCARELLI is a 68 y.o. 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  He has a left forearm loop graft placed by Dr. Arbie Cookey in 2019.  He states this ulcer has been present for about a month.  States the graft is working well.  No bleeding events.  Past Medical History:  Diagnosis Date   Adenomatous colon polyp 07/02/2011   Last colonoscopy May 06, 2011 by Dr. Rob Bunting, who recommended repeat colonoscopy in 5 years.    Anemia    Background diabetic retinopathy 04/20/2012   Patient is followed by Dr. Dione Booze    Cancer [facility])    Kidney   Cardiomyopathy    LV function improved from 2004 to 2008.  Historically, moderately dilated LV with EF 30-40% by 2D echo 08/14/2002.  Mild CAD with severe LV dysfunction by cardiac cath 09/2002.  Normal coronary arteries and normal LV function by cardiac cath 09/19/2006.  A 2-D echo on 04/01/2009 showed mild concentric hypertrophy and normal systolic (LVEF  60-65%) and doppler C/W with grade 1 diastolic dysfunction.   CHF (congestive heart failure) (HCC)    LV function improved from 2004 to 2008.  Historically, moderately dilated LV with EF 30-40% by 2D echo 08/14/2002.  Mild CAD with severe LV dysfunction by cardiac cath 09/2002.  Normal coronary arteries and normal LV function by cardiac cath 09/19/2006.  A 2-D echo on 04/01/2009 showed mild concentric hypertrophy and normal systolic (LVEF  60-65%) and doppler C/W with grade 1 diastolic dysfunction..    Chronic combined systolic and diastolic congestive heart failure (HCC) 05/21/2010   LV function improved from 2004 to 2008.  Historically, moderately dilated LV with EF 30-40% by 2D echo  08/14/2002.  Mild CAD with severe LV dysfunction by cardiac cath 09/2002.  Normal coronary arteries and normal LV function by cardiac cath 09/19/2006.  A 2-D echo on 04/01/2009 showed mild concentric hypertrophy and normal systolic (LVEF  60-65%) and doppler parameters consistent with abnormal left    CVA (cerebral vascular accident) (HCC) 07/04/2012   MRI of the brain 07/04/2012 showed an acute infarct in the right basal ganglia involving the anterior putamen, anterior limb internal capsule, and head of the caudate; this measured approximately 2.5 cm in diameter.      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   Qualifier: Diagnosis of  By: Meredith Pel MD, Dorene Sorrow     DM neuropathy, painful (HCC)    fingers and right knee   ESRD (end stage renal disease) (HCC)    Stage 4, on hemodailysis x 4 months as of 05-18-18 [place] [facility], tues thurs sat   ESRD (end stage renal disease) on dialysis (HCC)    "TTS; [facility]" (05/31/2018)   Hearing loss in right ear    Hyperlipidemia    Hypertension    Hypertensive crisis 07/28/2012   Hypertensive urgency 08/20/2014   Myocardial infarction [facility])  many yrs ago   Nephrotic syndrome 02/18/2013   A 24-hour urine collection 03/04/2013 showed total protein of 5,460 g and creatinine clearance of 80 mL/minute.  Patient was seen by [facility]  [place] at [facility] and a repeat 24-hour urine showed 10,407 mg protein.  Patient underwent kidney biopsy on 05/30/2013; pathology showed advanced diffuse and nodular diabetic nephropathy with vascular changes consistent with long-standing difficult to control hypertension.      Pneumonia 2014 and 2015   Renal insufficiency    Sleep apnea    11/06/20 - hasn't used it "in a while"   Surgical pneumoperitoneum    Trochanteric bursitis of right hip 04/25/2018    Past Surgical History:  Procedure Laterality Date   AV  FISTULA PLACEMENT Left 11/24/2017   Procedure: INSERTION OF ARTERIOVENOUS (AV) GORE-TEX GRAFT LEFT LOWER ARM;  Surgeon: Larina Earthly, MD;  Location: [facility] OR;  Service: Vascular;  Laterality: Left;   CARDIAC CATHETERIZATION      4 times   COLONOSCOPY     COLONOSCOPY WITH PROPOFOL N/A 06/18/2020   Procedure: COLONOSCOPY WITH PROPOFOL;  Surgeon: Rachael Fee, MD;  Location: [facility] ENDOSCOPY;  Service: Endoscopy;  Laterality: N/A;   FOOT SURGERY     INCISIONAL HERNIA REPAIR N/A 11/09/2020   Procedure: OPEN INCISIONAL HERNIA REPAIR WITH MESH;  Surgeon: Axel Filler, MD;  Location: [facility] OR;  Service: General;  Laterality: N/A;   INJECTION OF SILICONE OIL Left 12/20/2022   Procedure: INJECTION OF SILICONE OIL;  Surgeon: Carmela Rima, MD;  Location: [facility] OR;  Service: Ophthalmology;  Laterality: Left;  MAC WITH BLOCK   INSERTION OF MESH N/A 11/09/2020   Procedure: INSERTION OF MESH;  Surgeon: Axel Filler, MD;  Location: [facility] OR;  Service: General;  Laterality: N/A;   PARS PLANA VITRECTOMY Left 12/20/2022   Procedure: PARS PLANA VITRECTOMY WITH 25 GAUGE;  Surgeon: Carmela Rima, MD;  Location: [facility] OR;  Service: Ophthalmology;  Laterality: Left;   PHOTOCOAGULATION WITH LASER Left 12/20/2022   Procedure: PHOTOCOAGULATION WITH LASER;  Surgeon: Carmela Rima, MD;  Location: [facility] OR;  Service: Ophthalmology;  Laterality: Left;   POLYPECTOMY     POLYPECTOMY  06/18/2020   Procedure: POLYPECTOMY;  Surgeon: Rachael Fee, MD;  Location: [facility] ENDOSCOPY;  Service: Endoscopy;;   REPAIR OF COMPLEX TRACTION RETINAL DETACHMENT Left 12/20/2022   Procedure: REPAIR OF COMPLEX TRACTION RETINAL DETACHMENT;  Surgeon: Carmela Rima, MD;  Location: [facility] OR;  Service: Ophthalmology;  Laterality: Left;  MAC WITH BLOCK   ROBOT ASSISTED LAPAROSCOPIC NEPHRECTOMY Left 05/30/2018   Procedure: XI ROBOTIC ASSISTED LAPAROSCOPIC NEPHRECTOMY;  Surgeon: Sebastian Ache, MD;  Location: [facility] ORS;  Service: Urology;  Laterality: Left;    Family  History  Problem Relation Age of Onset   Aneurysm Father 33       died of rupture   Colon cancer Sister     SOCIAL HISTORY: Social History   Socioeconomic History   Marital status: Divorced    Spouse name: Not on file   Number of children: Not on file   Years of education: Not on file   Highest education level: Not on file  Occupational History   Not on file  Tobacco Use   Smoking status: Never   Smokeless tobacco: Never  Vaping Use   Vaping status: Never Used  Substance and Sexual Activity   Alcohol use: Not Currently   Drug use: No   Sexual activity: Not on file  Other Topics Concern   Not on file  Social History Narrative   Not on file   Social Drivers of Health   Financial Resource Strain: Medium Risk (11/24/2022)   Overall Financial Resource Strain (CARDIA)  Difficulty of Paying Living Expenses: Somewhat hard  Food Insecurity: No Food Insecurity (11/24/2022)   Hunger Vital Sign    Worried About Running Out of Food in the Last Year: Never true    Ran Out of Food in the Last Year: Never true  Transportation Needs: No Transportation Needs (11/24/2022)   PRAPARE - Administrator, Civil Service (Medical): No    Lack of Transportation (Non-Medical): No  Physical Activity: Inactive (11/24/2022)   Exercise Vital Sign    Days of Exercise per Week: 0 days    Minutes of Exercise per Session: 0 min  Stress: No Stress Concern Present (11/24/2022)   [employer] of Occupational Health - Occupational Stress Questionnaire    Feeling of Stress : Not at all  Social Connections: Moderately Isolated (11/24/2022)   Social Connection and Isolation Panel [NHANES]    Frequency of Communication with Friends and Family: More than three times a week    Frequency of Social Gatherings with Friends and Family: More than three times a week    Attends Religious Services: More than 4 times per year    Active Member of [employer] or Organizations: No    Attends Tax inspector Meetings: Never    Marital Status: Divorced  Catering manager Violence: Not At Risk (11/24/2022)   Humiliation, Afraid, Rape, and Kick questionnaire    Fear of Current or Ex-Partner: No    Emotionally Abused: No    Physically Abused: No    Sexually Abused: No    Allergies  Allergen Reactions   Ivp Dye [Iodinated Contrast Media] Other (See Comments)    Per patient's Nephrologist, he doesn't want the patient exposed to ANY dye because of issues with his kidneys   Hydrocodone     Caused hands and feet to peel    Phentermine Swelling   Amlodipine Swelling   Lisinopril Cough   Red Dye #40 (Allura Red) Other (See Comments)    NO dye of any kind (issues with his kidneys)    Current Outpatient Medications  Medication Sig Dispense Refill   acetaminophen (TYLENOL) 325 MG tablet Take 2 tablets (650 mg total) by mouth every 6 (six) hours as needed for mild pain.     albuterol (VENTOLIN HFA) 108 (90 Base) MCG/ACT inhaler Inhale 2 puffs into the lungs every 6 (six) hours as needed for wheezing or shortness of breath. 8 g 0   calcium acetate (PHOSLO) 667 MG capsule Take 2,001 mg by mouth 3 (three) times daily with meals.      carvedilol (COREG) 6.25 MG tablet Take 6.25 mg by mouth 2 (two) times daily.     famotidine (PEPCID) 20 MG tablet One after supper 30 tablet 11   fluticasone (FLONASE) 50 MCG/ACT nasal spray Place 2 sprays into both nostrils as needed for allergies. 16 g 1   folic acid-vitamin b complex-vitamin c-selenium-zinc (DIALYVITE) 3 MG TABS tablet Take 1 tablet by mouth daily.     furosemide (LASIX) 80 MG tablet Take 1 tablet (80 mg total) by mouth daily. 90 tablet 0   gabapentin (NEURONTIN) 300 MG capsule Take 1 capsule (300 mg total) by mouth 2 (two) times daily. 60 capsule 2   hydrALAZINE (APRESOLINE) 100 MG tablet Take 1 tablet (100 mg total) by mouth 3 (three) times daily. 90 tablet 2   pantoprazole (PROTONIX) 40 MG tablet One twice daily (take after dialysis) 30  tablet 0   pravastatin (PRAVACHOL) 40 MG tablet Take 1 tablet (40  mg total) by mouth every evening. 90 tablet 3   Tiotropium Bromide-Olodaterol (STIOLTO RESPIMAT) 2.5-2.5 MCG/ACT AERS 2 puffs each am 4 g 2   Tiotropium Bromide-Olodaterol (STIOLTO RESPIMAT) 2.5-2.5 MCG/ACT AERS Inhale 2 puffs into the lungs daily.     No current facility-administered medications for this visit.    REVIEW OF SYSTEMS:  [X]  denotes positive finding, [ ]  denotes negative finding Cardiac  Comments:  Chest pain or chest pressure:    Shortness of breath upon exertion:    Short of breath when lying flat:    Irregular heart rhythm:        Vascular    Pain in calf, thigh, or hip brought on by ambulation:    Pain in feet at night that wakes you up from your sleep:     Blood clot in your veins:    Leg swelling:         Pulmonary    Oxygen at home:    Productive cough:     Wheezing:         Neurologic    Sudden weakness in arms or legs:     Sudden numbness in arms or legs:     Sudden onset of difficulty speaking or slurred speech:    Temporary loss of vision in one eye:     Problems with dizziness:         Gastrointestinal    Blood in stool:     Vomited blood:         Genitourinary    Burning when urinating:     Blood in urine:        Psychiatric    Major depression:         Hematologic    Bleeding problems:    Problems with blood clotting too easily:        Skin    Rashes or ulcers:        Constitutional    Fever or chills:      PHYSICAL EXAM: Vitals:   11/17/23 0856  BP: (!) 150/85  Pulse: 69  Resp: 18  Temp: 98.1 F (36.7 C)  TempSrc: Temporal  SpO2: 98%  Weight: 210 lb 4.8 oz (95.4 kg)  Height: 5\' 9"  (1.753 m)    GENERAL: The patient is a well-nourished male, in no acute distress. The vital signs are documented above. CARDIAC: There is a regular rate and rhythm.  VASCULAR:  Left forearm AV graft is pictured with large ulcer with and likely pseudoaneurysm Left forearm  graft with good thrill  PULMONARY: No respiratory distress. ABDOMEN: Soft and non-tender. MUSCULOSKELETAL: There are no major deformities or cyanosis. NEUROLOGIC: No focal weakness or paresthesias are detected. PSYCHIATRIC: The patient has a normal affect.    DATA:   N/A  Assessment/Plan:  68 y.o. 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  He has a left forearm loop graft placed by Dr. Arbie Cookey in 2019.   Given the large ulcer as pictured above over his left forearm AVG, I have recommended left arm AV graft revision.  I discussed this would likely be done within interposition given that this is an AV graft and would not be amendable to plication.  It looks like they would have enough room to access this laterally in the forearm for dialysis and I have asked him to discuss with  his HD nurses tomorrow to ensure this is an option.  Otherwise we will have to place a TDC.  Risk benefits discussed.  Discussed this is to reduce his bleeding risk.  Will need one months to heal.  Will schedule next week in OR.   Cephus Shelling, MD [facility] Office: [phone]

## 2023-11-18 DIAGNOSIS — N186 End stage renal disease: Secondary | ICD-10-CM | POA: Diagnosis not present

## 2023-11-18 DIAGNOSIS — D689 Coagulation defect, unspecified: Secondary | ICD-10-CM | POA: Diagnosis not present

## 2023-11-18 DIAGNOSIS — Z992 Dependence on renal dialysis: Secondary | ICD-10-CM | POA: Diagnosis not present

## 2023-11-18 DIAGNOSIS — N2581 Secondary hyperparathyroidism of renal origin: Secondary | ICD-10-CM | POA: Diagnosis not present

## 2023-11-21 ENCOUNTER — Other Ambulatory Visit: Payer: Self-pay

## 2023-11-21 ENCOUNTER — Encounter (HOSPITAL_COMMUNITY): Payer: Self-pay | Admitting: Vascular Surgery

## 2023-11-21 DIAGNOSIS — Z992 Dependence on renal dialysis: Secondary | ICD-10-CM | POA: Diagnosis not present

## 2023-11-21 DIAGNOSIS — D689 Coagulation defect, unspecified: Secondary | ICD-10-CM | POA: Diagnosis not present

## 2023-11-21 DIAGNOSIS — N186 End stage renal disease: Secondary | ICD-10-CM | POA: Diagnosis not present

## 2023-11-21 DIAGNOSIS — N2581 Secondary hyperparathyroidism of renal origin: Secondary | ICD-10-CM | POA: Diagnosis not present

## 2023-11-21 NOTE — Anesthesia Preprocedure Evaluation (Signed)
 Anesthesia Evaluation  Patient identified by MRN, date of birth, ID band Patient awake    Reviewed: Allergy & Precautions, NPO status , Patient's Chart, lab work & pertinent test results  History of Anesthesia Complications Negative for: history of anesthetic complications  Airway Mallampati: III  TM Distance: >3 FB Neck ROM: Full   Comment: Previous grade II view with MAC 4, easy mask with OPA Dental  (+) Edentulous Upper, Edentulous Lower   Pulmonary neg shortness of breath, sleep apnea (intermittent CPAP use) , neg COPD, neg recent URI   Pulmonary exam normal breath sounds clear to auscultation       Cardiovascular hypertension (carvedilol, hydralazine, losartan), Pt. on home beta blockers pulmonary hypertension(-) angina + Past MI, + Peripheral Vascular Disease, +CHF and + DOE  + Valvular Problems/Murmurs (mild) MR  Rhythm:Regular Rate:Normal  HLD, bilateral carotid artery disease  TTE 09/05/2022: Summary   1. The left ventricle is severely dilated in size with upper normal wall  thickness.   2. The left ventricular systolic function is severely decreased, LVEF is  visually estimated at 20%.    3. There is grade II diastolic dysfunction (elevated filling pressure).    4. Mitral annular calcification is present (mild).    5. There is moderate mitral valve regurgitation.    6. The left atrium is mildly to moderately dilated in size.    7. The aortic valve is trileaflet with mildly thickened leaflets with normal  excursion.   8. The aorta at the sinuses of Valsalva is mildly dilated.    9. The right ventricle is moderately dilated in size, with moderately  reduced systolic function.    10. There is moderate pulmonary hypertension.    11. TR maximum velocity: 3.0 m/s  Estimated PASP: 50 mmHg.    12. The right atrium is mildly to moderately dilated in size.    13. IVC size and inspiratory change suggest elevated right atrial  pressure.  (10-20 mmHg).     Neuro/Psych neg Seizures  Neuromuscular disease (peripheral neuropathy) CVA (07/04/2012), No Residual Symptoms    GI/Hepatic Neg liver ROS,GERD  Medicated,,  Endo/Other  diabetes, Type 2    Renal/GU ESRF and DialysisRenal disease (h/o cancer)     Musculoskeletal  (+) Arthritis , Osteoarthritis,    Abdominal   Peds  Hematology  (+) Blood dyscrasia, anemia Lab Results      Component                Value               Date                      WBC                      4.2                 05/22/2022                HGB                      14.6                12/20/2022                HCT                      43.0  12/20/2022                MCV                      94.4                05/22/2022                PLT                      123 (L)             05/22/2022              Anesthesia Other Findings   Reproductive/Obstetrics                             Anesthesia Physical Anesthesia Plan  ASA: 4  Anesthesia Plan: MAC and Regional   Post-op Pain Management: Regional block*   Induction: Intravenous  PONV Risk Score and Plan: 1 and Ondansetron, Dexamethasone, Propofol infusion and Treatment may vary due to age or medical condition  Airway Management Planned: Natural Airway and Simple Face Mask  Additional Equipment:   Intra-op Plan:   Post-operative Plan:   Informed Consent:      Dental advisory given  Plan Discussed with: CRNA and Anesthesiologist  Anesthesia Plan Comments: (Discussed potential risks of nerve blocks including, but not limited to, infection, bleeding, nerve damage, seizures, pneumothorax, respiratory depression, and potential failure of the block. Alternatives to nerve blocks discussed. All questions answered.  Discussed with patient risks of MAC including, but not limited to, minor pain or discomfort, hearing people in the room, and possible need for backup general  anesthesia. Risks for general anesthesia also discussed including, but not limited to, sore throat, hoarse voice, chipped/damaged teeth, injury to vocal cords, nausea and vomiting, allergic reactions, lung infection, heart attack, stroke, and death. All questions answered. )        Anesthesia Quick Evaluation

## 2023-11-21 NOTE — Progress Notes (Signed)
 SDW CALL  Patient was given pre-op instructions over the phone. The opportunity was given for the patient to ask questions. No further questions asked. Patient verbalized understanding of instructions given.   PCP - Cherylann Ratel Cardiologist - Wendie Simmer sees cardiology at [facility] for transplant evaluation - Dr. Deniece Ree  PPM/ICD - denies Device Orders -  Rep Notified -   Chest x-ray - 05/22/23 EKG - DOS Stress Test - 09/14/23 ECHO - 09/03/22 [facility] Cardiac Cath - 2013  CE  Sleep Study -  CPAP -   Fasting Blood Sugar - pt states he lost weight  and "is not diabetic anymore" . He does not check his sugar at home. He says he gets it checked at dialysis but he does not know what it runs.  Checks Blood Sugar _____ times a day  Blood Thinner Instructions:na Aspirin Instructions:na  ERAS Protcol -no PRE-SURGERY Ensure or G2-   COVID TEST- na   Anesthesia review: no  Patient denies shortness of breath, fever, cough and chest pain over the phone call    Surgical Instructions    Your procedure is scheduled on April 9  Report to [facility] Main Entrance "A" at 0800 A.M., then check in with the Admitting office.  Call this number if you have problems the morning of surgery:  [phone]    Remember:  Do not eat or drink anything after midnight the night before your surgery   Take these medicines the morning of surgery with A SIP OF WATER: Hydralazine,Carvedilol,Gabapentin,Flonase,Stiolto,Tylenol    As of today, STOP taking any Aspirin (unless otherwise instructed by your surgeon) Aleve, Naproxen, Ibuprofen, Motrin, Advil, Goody's, BC's, all herbal medications, fish oil, and all vitamins.  [facility] is not responsible for any belongings or valuables. .   Do NOT Smoke (Tobacco/Vaping)  24 hours prior to your procedure  If you use a CPAP at night, you may bring your mask for your overnight stay.   Contacts, glasses,  hearing aids, dentures or partials may not be worn into surgery, please bring cases for these belongings   Patients discharged the day of surgery will not be allowed to drive home, and someone needs to stay with them for 24 hours.       Special instructions:    Oral Hygiene is also important to reduce your risk of infection.  Remember - BRUSH YOUR TEETH THE MORNING OF SURGERY WITH YOUR REGULAR TOOTHPASTE   Day of Surgery:  Take a shower the day of or night before with antibacterial soap. Wear Clean/Comfortable clothing the morning of surgery Do not apply any deodorants/lotions.   Do not wear jewelry or makeup Do not wear lotions, powders, perfumes/colognes, or deodorant. Do not shave 48 hours prior to surgery.  Men may shave face and neck. Do not bring valuables to the hospital. Do not wear nail polish, gel polish, artificial nails, or any other type of covering on natural nails (fingers and toes) If you have artificial nails or gel coating that need to be removed by a nail salon, please have this removed prior to surgery. Artificial nails or gel coating may interfere with anesthesia's ability to adequately monitor your vital signs. Remember to brush your teeth WITH YOUR REGULAR TOOTHPASTE.

## 2023-11-22 ENCOUNTER — Ambulatory Visit (HOSPITAL_COMMUNITY)
Admission: RE | Admit: 2023-11-22 | Discharge: 2023-11-22 | Disposition: A | Discharge status: Home / Self Care | Attending: Vascular Surgery | Admitting: Vascular Surgery

## 2023-11-22 ENCOUNTER — Ambulatory Visit (HOSPITAL_COMMUNITY): Admitting: Anesthesiology

## 2023-11-22 ENCOUNTER — Other Ambulatory Visit: Payer: Self-pay

## 2023-11-22 ENCOUNTER — Encounter (HOSPITAL_COMMUNITY): Payer: Self-pay | Admitting: Vascular Surgery

## 2023-11-22 ENCOUNTER — Other Ambulatory Visit (HOSPITAL_COMMUNITY): Payer: Self-pay

## 2023-11-22 ENCOUNTER — Encounter (HOSPITAL_COMMUNITY)
Admission: RE | Disposition: A | Discharge status: Home / Self Care | Payer: Self-pay | Source: Home / Self Care | Attending: Vascular Surgery

## 2023-11-22 ENCOUNTER — Ambulatory Visit (HOSPITAL_COMMUNITY)

## 2023-11-22 DIAGNOSIS — Z79899 Other long term (current) drug therapy: Secondary | ICD-10-CM | POA: Insufficient documentation

## 2023-11-22 DIAGNOSIS — Z5986 Financial insecurity: Secondary | ICD-10-CM | POA: Diagnosis not present

## 2023-11-22 DIAGNOSIS — I132 Hypertensive heart and chronic kidney disease with heart failure and with stage 5 chronic kidney disease, or end stage renal disease: Secondary | ICD-10-CM

## 2023-11-22 DIAGNOSIS — Z992 Dependence on renal dialysis: Secondary | ICD-10-CM | POA: Insufficient documentation

## 2023-11-22 DIAGNOSIS — N185 Chronic kidney disease, stage 5: Secondary | ICD-10-CM

## 2023-11-22 DIAGNOSIS — I252 Old myocardial infarction: Secondary | ICD-10-CM | POA: Insufficient documentation

## 2023-11-22 DIAGNOSIS — T82898A Other specified complication of vascular prosthetic devices, implants and grafts, initial encounter: Secondary | ICD-10-CM

## 2023-11-22 DIAGNOSIS — E1122 Type 2 diabetes mellitus with diabetic chronic kidney disease: Secondary | ICD-10-CM

## 2023-11-22 DIAGNOSIS — G4730 Sleep apnea, unspecified: Secondary | ICD-10-CM | POA: Diagnosis not present

## 2023-11-22 DIAGNOSIS — N186 End stage renal disease: Secondary | ICD-10-CM | POA: Insufficient documentation

## 2023-11-22 DIAGNOSIS — I5042 Chronic combined systolic (congestive) and diastolic (congestive) heart failure: Secondary | ICD-10-CM | POA: Insufficient documentation

## 2023-11-22 DIAGNOSIS — K219 Gastro-esophageal reflux disease without esophagitis: Secondary | ICD-10-CM | POA: Diagnosis not present

## 2023-11-22 DIAGNOSIS — Z8673 Personal history of transient ischemic attack (TIA), and cerebral infarction without residual deficits: Secondary | ICD-10-CM | POA: Insufficient documentation

## 2023-11-22 DIAGNOSIS — E785 Hyperlipidemia, unspecified: Secondary | ICD-10-CM | POA: Diagnosis not present

## 2023-11-22 DIAGNOSIS — E11319 Type 2 diabetes mellitus with unspecified diabetic retinopathy without macular edema: Secondary | ICD-10-CM | POA: Insufficient documentation

## 2023-11-22 DIAGNOSIS — E1142 Type 2 diabetes mellitus with diabetic polyneuropathy: Secondary | ICD-10-CM | POA: Diagnosis not present

## 2023-11-22 DIAGNOSIS — I509 Heart failure, unspecified: Secondary | ICD-10-CM

## 2023-11-22 DIAGNOSIS — I2510 Atherosclerotic heart disease of native coronary artery without angina pectoris: Secondary | ICD-10-CM | POA: Diagnosis not present

## 2023-11-22 HISTORY — PX: REVISON OF ARTERIOVENOUS FISTULA: SHX6074

## 2023-11-22 LAB — POCT I-STAT, CHEM 8
BUN: 47 mg/dL — ABNORMAL HIGH (ref 8–23)
Calcium, Ion: 1.05 mmol/L — ABNORMAL LOW (ref 1.15–1.40)
Chloride: 101 mmol/L (ref 98–111)
Creatinine, Ser: 10.6 mg/dL — ABNORMAL HIGH (ref 0.61–1.24)
Glucose, Bld: 87 mg/dL (ref 70–99)
HCT: 41 % (ref 39.0–52.0)
Hemoglobin: 13.9 g/dL (ref 13.0–17.0)
Potassium: 4.6 mmol/L (ref 3.5–5.1)
Sodium: 139 mmol/L (ref 135–145)
TCO2: 24 mmol/L (ref 22–32)

## 2023-11-22 LAB — GLUCOSE, CAPILLARY
Glucose-Capillary: 87 mg/dL (ref 70–99)
Glucose-Capillary: 93 mg/dL (ref 70–99)
Glucose-Capillary: 87 mg/dL (ref 70–99)

## 2023-11-22 SURGERY — REVISON OF ARTERIOVENOUS FISTULA
Anesthesia: Monitor Anesthesia Care | Laterality: Left

## 2023-11-22 MED ORDER — CEFAZOLIN SODIUM-DEXTROSE 2-4 GM/100ML-% IV SOLN
2.0000 g | INTRAVENOUS | Status: AC
  Administered 2023-11-22: 2 g via INTRAVENOUS

## 2023-11-22 MED ORDER — OXYCODONE HCL 5 MG PO TABS
5.0000 mg | ORAL_TABLET | Freq: Four times a day (QID) | ORAL | 0 refills | Status: DC | PRN
  Filled 2023-11-22: qty 20, 5d supply, fill #0

## 2023-11-22 MED ORDER — CHLORHEXIDINE GLUCONATE 0.12 % MT SOLN: 15.0000 mL | Freq: Once | OROMUCOSAL | Status: AC

## 2023-11-22 MED ORDER — HEPARIN 6000 UNIT IRRIGATION SOLUTION
Status: DC | PRN
  Administered 2023-11-22: 1

## 2023-11-22 MED ORDER — HEMOSTATIC AGENTS (NO CHARGE) OPTIME
TOPICAL | Status: DC | PRN
  Administered 2023-11-22: 1 via TOPICAL

## 2023-11-22 MED ORDER — HEPARIN SODIUM (PORCINE) 1000 UNIT/ML IJ SOLN
INTRAMUSCULAR | Status: DC | PRN
  Administered 2023-11-22: 5000 [IU] via INTRAVENOUS

## 2023-11-22 MED ORDER — CHLORHEXIDINE GLUCONATE 4 % EX SOLN: 60.0000 mL | Freq: Once | CUTANEOUS | Status: DC

## 2023-11-22 MED ORDER — FENTANYL CITRATE (PF) 250 MCG/5ML IJ SOLN
INTRAMUSCULAR | Status: DC | PRN
  Administered 2023-11-22: 50 ug via INTRAVENOUS

## 2023-11-22 MED ORDER — ORAL CARE MOUTH RINSE: 15.0000 mL | Freq: Once | OROMUCOSAL | Status: AC

## 2023-11-22 MED ORDER — PHENYLEPHRINE 80 MCG/ML (10ML) SYRINGE FOR IV PUSH (FOR BLOOD PRESSURE SUPPORT)
PREFILLED_SYRINGE | INTRAVENOUS | Status: DC | PRN
  Administered 2023-11-22: 120 ug via INTRAVENOUS

## 2023-11-22 MED ORDER — HEPARIN 6000 UNIT IRRIGATION SOLUTION
Status: AC
  Filled 2023-11-22: qty 500

## 2023-11-22 MED ORDER — CEFAZOLIN SODIUM 1 G IJ SOLR
INTRAMUSCULAR | Status: AC
  Filled 2023-11-22: qty 20

## 2023-11-22 MED ORDER — 0.9 % SODIUM CHLORIDE (POUR BTL) OPTIME
TOPICAL | Status: DC | PRN
  Administered 2023-11-22: 1000 mL

## 2023-11-22 MED ORDER — SODIUM CHLORIDE 0.9 % IV SOLN: INTRAVENOUS | Status: DC

## 2023-11-22 MED ORDER — CHLORHEXIDINE GLUCONATE 0.12 % MT SOLN
OROMUCOSAL | Status: AC
  Administered 2023-11-22: 15 mL via OROMUCOSAL
  Filled 2023-11-22: qty 15

## 2023-11-22 MED ORDER — FENTANYL CITRATE (PF) 250 MCG/5ML IJ SOLN
INTRAMUSCULAR | Status: AC
  Filled 2023-11-22: qty 5

## 2023-11-22 MED ORDER — MIDAZOLAM HCL 2 MG/2ML IJ SOLN: 1.0000 mg | Freq: Once | INTRAMUSCULAR | Status: AC

## 2023-11-22 MED ORDER — ROPIVACAINE HCL 5 MG/ML IJ SOLN
INTRAMUSCULAR | Status: DC | PRN
  Administered 2023-11-22: 30 mL via PERINEURAL

## 2023-11-22 MED ORDER — FENTANYL CITRATE (PF) 100 MCG/2ML IJ SOLN: 25.0000 ug | INTRAMUSCULAR | Status: DC | PRN

## 2023-11-22 MED ORDER — FENTANYL CITRATE (PF) 100 MCG/2ML IJ SOLN: 100.0000 ug | Freq: Once | INTRAMUSCULAR | Status: AC

## 2023-11-22 MED ORDER — FENTANYL CITRATE (PF) 100 MCG/2ML IJ SOLN
INTRAMUSCULAR | Status: AC
  Administered 2023-11-22: 100 ug via INTRAVENOUS
  Filled 2023-11-22: qty 2

## 2023-11-22 MED ORDER — PROPOFOL 500 MG/50ML IV EMUL
INTRAVENOUS | Status: DC | PRN
  Administered 2023-11-22: 100 ug/kg/min via INTRAVENOUS

## 2023-11-22 MED ORDER — FENTANYL CITRATE (PF) 100 MCG/2ML IJ SOLN: 50.0000 ug | Freq: Once | INTRAMUSCULAR | Status: DC

## 2023-11-22 MED ORDER — PHENYLEPHRINE HCL-NACL 20-0.9 MG/250ML-% IV SOLN
INTRAVENOUS | Status: DC | PRN
  Administered 2023-11-22: 40 ug/min via INTRAVENOUS

## 2023-11-22 MED ORDER — MIDAZOLAM HCL 2 MG/2ML IJ SOLN
INTRAMUSCULAR | Status: AC
  Administered 2023-11-22: 1 mg via INTRAVENOUS
  Filled 2023-11-22: qty 2

## 2023-11-22 MED ORDER — SODIUM CHLORIDE 0.9% FLUSH: 3.0000 mL | INTRAVENOUS | Status: DC | PRN

## 2023-11-22 SURGICAL SUPPLY — 60 items
ARMBAND PINK RESTRICT EXTREMIT (MISCELLANEOUS) ×2 IMPLANT
BAG COUNTER SPONGE SURGICOUNT (BAG) ×2 IMPLANT
BAG DECANTER FOR FLEXI CONT (MISCELLANEOUS) ×2 IMPLANT
BIOPATCH RED 1 DISK 7.0 (GAUZE/BANDAGES/DRESSINGS) ×2 IMPLANT
BNDG ELASTIC 6INX 5YD STR LF (GAUZE/BANDAGES/DRESSINGS) ×1 IMPLANT
CANISTER SUCT 3000ML PPV (MISCELLANEOUS) ×2 IMPLANT
CATH PALINDROME-P 19CM W/VT (CATHETERS) IMPLANT
CATH PALINDROME-P 23CM W/VT (CATHETERS) IMPLANT
CATH PALINDROME-P 28CM W/VT (CATHETERS) IMPLANT
CATH STRAIGHT 5FR 65CM (CATHETERS) IMPLANT
CLIP TI MEDIUM 6 (CLIP) ×2 IMPLANT
CLIP TI WIDE RED SMALL 6 (CLIP) ×2 IMPLANT
COVER PROBE W GEL 5X96 (DRAPES) ×2 IMPLANT
COVER SURGICAL LIGHT HANDLE (MISCELLANEOUS) ×2 IMPLANT
DERMABOND ADVANCED .7 DNX12 (GAUZE/BANDAGES/DRESSINGS) ×2 IMPLANT
DRAPE C-ARM 42X72 X-RAY (DRAPES) ×2 IMPLANT
DRAPE CHEST BREAST 15X10 FENES (DRAPES) ×2 IMPLANT
ELECT REM PT RETURN 9FT ADLT (ELECTROSURGICAL) ×2 IMPLANT
ELECTRODE REM PT RTRN 9FT ADLT (ELECTROSURGICAL) ×2 IMPLANT
GAUZE 4X4 16PLY ~~LOC~~+RFID DBL (SPONGE) ×2 IMPLANT
GLOVE BIO SURGEON STRL SZ7.5 (GLOVE) ×2 IMPLANT
GLOVE BIOGEL PI IND STRL 7.0 (GLOVE) ×1 IMPLANT
GLOVE BIOGEL PI IND STRL 8 (GLOVE) ×2 IMPLANT
GOWN STRL REUS W/ TWL LRG LVL3 (GOWN DISPOSABLE) ×4 IMPLANT
GOWN STRL REUS W/ TWL XL LVL3 (GOWN DISPOSABLE) ×4 IMPLANT
GRAFT GORETEX STRETCH 6X40 (Vascular Products) ×1 IMPLANT
HEMOSTAT SPONGE AVITENE ULTRA (HEMOSTASIS) IMPLANT
INSERT FOGARTY SM (MISCELLANEOUS) ×1 IMPLANT
KIT BASIN OR (CUSTOM PROCEDURE TRAY) ×2 IMPLANT
KIT PALINDROME-P 55CM (CATHETERS) IMPLANT
KIT TURNOVER KIT B (KITS) ×2 IMPLANT
NDL 18GX1X1/2 (RX/OR ONLY) (NEEDLE) ×1 IMPLANT
NDL HYPO 25GX1X1/2 BEV (NEEDLE) ×1 IMPLANT
NEEDLE 18GX1X1/2 (RX/OR ONLY) (NEEDLE) ×2 IMPLANT
NEEDLE HYPO 25GX1X1/2 BEV (NEEDLE) ×2 IMPLANT
NS IRRIG 1000ML POUR BTL (IV SOLUTION) ×2 IMPLANT
PACK CV ACCESS (CUSTOM PROCEDURE TRAY) ×2 IMPLANT
PACK SRG BSC III STRL LF ECLPS (CUSTOM PROCEDURE TRAY) ×2 IMPLANT
PAD ARMBOARD POSITIONER FOAM (MISCELLANEOUS) ×4 IMPLANT
SET MICROPUNCTURE 5F STIFF (MISCELLANEOUS) IMPLANT
SLING ARM FOAM STRAP LRG (SOFTGOODS) ×1 IMPLANT
SLING ARM FOAM STRAP MED (SOFTGOODS) IMPLANT
SOAP 2 % CHG 4 OZ (WOUND CARE) ×2 IMPLANT
SPIKE FLUID TRANSFER (MISCELLANEOUS) ×2 IMPLANT
STAPLER VISISTAT 35W (STAPLE) IMPLANT
SUT ETHILON 3 0 PS 1 (SUTURE) ×2 IMPLANT
SUT MNCRL AB 4-0 PS2 18 (SUTURE) ×2 IMPLANT
SUT PROLENE 5 0 C 1 24 (SUTURE) IMPLANT
SUT PROLENE 6 0 BV (SUTURE) ×4 IMPLANT
SUT PROLENE 7 0 BV 1 (SUTURE) IMPLANT
SUT VIC AB 3-0 SH 27X BRD (SUTURE) ×2 IMPLANT
SYR 10ML LL (SYRINGE) ×2 IMPLANT
SYR 20ML LL LF (SYRINGE) ×4 IMPLANT
SYR 5ML LL (SYRINGE) ×2 IMPLANT
SYR CONTROL 10ML LL (SYRINGE) ×2 IMPLANT
TOWEL GREEN STERILE (TOWEL DISPOSABLE) ×2 IMPLANT
TOWEL GREEN STERILE FF (TOWEL DISPOSABLE) ×2 IMPLANT
UNDERPAD 30X36 HEAVY ABSORB (UNDERPADS AND DIAPERS) ×2 IMPLANT
WATER STERILE IRR 1000ML POUR (IV SOLUTION) ×2 IMPLANT
WIRE AMPLATZ SS-J .035X180CM (WIRE) IMPLANT

## 2023-11-22 NOTE — Discharge Instructions (Signed)

## 2023-11-22 NOTE — Transfer of Care (Signed)
 Immediate Anesthesia Transfer of Care Note  Patient: Royetta Asal  Procedure(s) Performed: REVISON OF ARTERIOVENOUS GRAFT WITH INTERPOSITION (Left)  Patient Location: PACU  Anesthesia Type:MAC and Regional  Level of Consciousness: awake and alert   Airway & Oxygen Therapy: Patient Spontanous Breathing and Patient connected to face mask oxygen  Post-op Assessment: Report given to RN and Post -op Vital signs reviewed and stable  Post vital signs: Reviewed and stable  Last Vitals:  Vitals Value Taken Time  BP 113/75 11/22/23 1155  Temp    Pulse 83 11/22/23 1157  Resp 18 11/22/23 1157  SpO2 99 % 11/22/23 1157  Vitals shown include unfiled device data.  Last Pain:  Vitals:   11/22/23 0954  TempSrc:   PainSc: Asleep         Complications: No notable events documented.

## 2023-11-22 NOTE — Anesthesia Procedure Notes (Signed)
 Anesthesia Regional Block: Supraclavicular block   Pre-Anesthetic Checklist: , timeout performed,  Correct Patient, Correct Site, Correct Laterality,  Correct Procedure, Correct Position, site marked,  Risks and benefits discussed,  Surgical consent,  Pre-op evaluation,  At surgeon's request and post-op pain management  Laterality: Left  Prep: chloraprep       Needles:  Injection technique: Single-shot  Needle Type: Echogenic Stimulator Needle     Needle Length: 9cm  Needle Gauge: 21     Additional Needles:   Procedures:,,,, ultrasound used (permanent image in chart),,    Narrative:  Start time: 11/22/2023 9:46 AM End time: 11/22/2023 9:50 AM Injection made incrementally with aspirations every 5 mL.  Performed by: Personally  Anesthesiologist: Linton Rump, MD  Additional Notes: Discussed risks and benefits of nerve block including, but not limited to, prolonged and/or permanent nerve injury involving sensory and/or motor function. Monitors were applied and a time-out was performed. The nerve and associated structures were visualized under ultrasound guidance. After negative aspiration, local anesthetic was slowly injected around the nerve. There was no evidence of high pressure during the procedure. There were no paresthesias. VSS remained stable and the patient tolerated the procedure well.  Picture is labeled as interscalene block, but it is a supraclavicular block.

## 2023-11-22 NOTE — Op Note (Signed)
 Date: November 22, 2023  Preoperative diagnosis: 1.  End-stage renal disease 2.  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 Cephus Shelling, MD  Assistant: Aggie Moats, PA  Indications: 68 year old male with end-stage renal disease using a left forearm loop graft.  Seen in the office last week with a large ulcer over pseudoaneurysmal segment of the graft in his forearm.  He presents for revision of the graft with new interposition tunneled around the pseudoaneurysm with ulcer.  Risk benefits discussed.  Assistant was needed given the complexity of the case and also for sewing the anastomosis of the new interposition graft.  Findings: New 6 mm PTFE graft was tunneled lateral to the existing pseudoaneurysm with ulcer and new end-to-end primary anastomosis were performed to both the arterial and venous end of the existing forearm loop graft to exclude the ulcer and pseudoaneurysmal segment of old great.  Excellent thrill at completion.  Palpable radial pulse.  Anesthesia: Regional block  Details: Patient was taken to the operating room after informed consent was obtained.  Placed on the operative table in the supine position.  Patient did receive regional block prior to transport to the OR.  The left arm was then prepped and draped in standard sterile fashion.  Timeout was performed and antibiotics were given.  Initially made a transverse incision over the left forearm loop graft both proximal and distal to the pseudoaneurysm with ulcer.  I dissected out the graft with Bovie cautery through each of these incisions.  These were controlled with Vesseloops.  A new 6 mm graft was brought on the field and I used a curved tunneler to create a new tunnel around the pseudoaneurysm with ulcer segment of old graft and exclude the segment that was degenerated.  Patient  was then given 5000 units of IV heparin.  I used fistula clamps for control of the existing graft both the arterial and venous end.  We then transected the graft to exclude the pseudoaneurysm and ulcer.  I performed a new primary end-to-end anastomosis with 6-0 Prolene with the new graft to the old graft on both the arterial and venous end of the existing forearm loop graft with the help of my assistant.  This was de-aired prior to completion.  Excellent thrill.  Both wounds were irrigated and closed with 3-0 Vicryl 4-0 Monocryl and Dermabond.  Complication: None  Condition: Stable  Cephus Shelling, MD [facility] [place] Office: [phone]   Cephus Shelling

## 2023-11-22 NOTE — H&P (Signed)
 History and Physical Interval Note:  11/22/2023 9:39 AM  Charles Hart  has presented today for surgery, with the diagnosis of ESRD.  The various methods of treatment have been discussed with the patient and family. After consideration of risks, benefits and other options for treatment, the patient has consented to  Procedure(s): REVISON OF ARTERIOVENOUS FISTULA (Left) INSERTION OF DIALYSIS CATHETER (N/A) as a surgical intervention.  The patient's history has been reviewed, patient examined, no change in status, stable for surgery.  I have reviewed the patient's chart and labs.  Questions were answered to the patient's satisfaction.    Revision of left forearm AV graft with large ulcer.  Should be able to do this without TDC as there is enough room laterally with a forearm loop graft to access.  Cephus Shelling     Patient name: Charles Hart     MRN: [id number]        DOB: 03-15-1956          Sex: male   REASON FOR CONSULT: Large sore on dialysis access that is oozing   HPI: Charles Hart is a 68 y.o. 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  He has a left forearm loop graft placed by Dr. Arbie Cookey in 2019.  He states this ulcer has been present for about a month.  States the graft is working well.  No bleeding events.       Past Medical History:  Diagnosis Date   Adenomatous colon polyp 07/02/2011    Last colonoscopy May 06, 2011 by Dr. Rob Bunting, who recommended repeat colonoscopy in 5 years.    Anemia     Background diabetic retinopathy 04/20/2012    Patient is followed by Dr. Dione Booze    Cancer [facility])      Kidney   Cardiomyopathy      LV function improved from 2004 to 2008.  Historically, moderately dilated LV with EF 30-40% by 2D echo 08/14/2002.  Mild CAD with severe LV dysfunction by cardiac cath  09/2002.  Normal coronary arteries and normal LV function by cardiac cath 09/19/2006.  A 2-D echo on 04/01/2009 showed mild concentric hypertrophy and normal systolic (LVEF  60-65%) and doppler C/W with grade 1 diastolic dysfunction.   CHF (congestive heart failure) (HCC)      LV function improved from 2004 to 2008.  Historically, moderately dilated LV with EF 30-40% by 2D echo 08/14/2002.  Mild CAD with severe LV dysfunction by cardiac cath 09/2002.  Normal coronary arteries and normal LV function by cardiac cath 09/19/2006.  A 2-D echo on 04/01/2009 showed mild concentric hypertrophy and normal systolic (LVEF  60-65%) and doppler C/W with grade 1 diastolic dysfunction..    Chronic combined systolic and diastolic congestive heart failure (HCC) 05/21/2010    LV function improved from 2004 to 2008.  Historically, moderately dilated LV with EF 30-40% by 2D echo 08/14/2002.  Mild CAD with severe LV dysfunction by cardiac cath 09/2002.  Normal coronary arteries and normal LV function by cardiac cath 09/19/2006.  A 2-D echo on 04/01/2009 showed mild concentric hypertrophy and normal systolic (LVEF  60-65%) and doppler parameters consistent with abnormal left    CVA (cerebral vascular accident) (HCC) 07/04/2012    MRI of the brain 07/04/2012 showed an acute infarct in the right basal ganglia involving the anterior putamen,  anterior limb internal capsule, and head of the caudate; this measured approximately 2.5 cm in diameter.      Dermatitis     Diabetes mellitus      type 2- pt reports he has not been DM in 9 years   DIABETIC PERIPHERAL NEUROPATHY 08/03/2007    Qualifier: Diagnosis of  By: Meredith Pel MD, Dorene Sorrow     DM neuropathy, painful (HCC)      fingers and right knee   ESRD (end stage renal disease) (HCC)      Stage 4, on hemodailysis x 4 months as of 05-18-18 [facility], tues thurs sat   ESRD (end stage renal disease) on dialysis (HCC)      "TTS; [facility]" (05/31/2018)   Hearing loss in right ear      Hyperlipidemia     Hypertension     Hypertensive crisis 07/28/2012   Hypertensive urgency 08/20/2014   Myocardial infarction [facility])  many yrs ago   Nephrotic syndrome 02/18/2013    A 24-hour urine collection 03/04/2013 showed total protein of 5,460 g and creatinine clearance of 80 mL/minute.  Patient was seen by Loma Sender at [facility] and a repeat 24-hour urine showed 10,407 mg protein.  Patient underwent kidney biopsy on 05/30/2013; pathology showed advanced diffuse and nodular diabetic nephropathy with vascular changes consistent with long-standing difficult to control hypertension.      Pneumonia 2014 and 2015   Renal insufficiency     Sleep apnea      11/06/20 - hasn't used it "in a while"   Surgical pneumoperitoneum     Trochanteric bursitis of right hip 04/25/2018               Past Surgical History:  Procedure Laterality Date   AV FISTULA PLACEMENT Left 11/24/2017    Procedure: INSERTION OF ARTERIOVENOUS (AV) GORE-TEX GRAFT LEFT LOWER ARM;  Surgeon: Larina Earthly, MD;  Location: [facility] OR;  Service: Vascular;  Laterality: Left;   CARDIAC CATHETERIZATION         4 times   COLONOSCOPY       COLONOSCOPY WITH PROPOFOL N/A 06/18/2020    Procedure: COLONOSCOPY WITH PROPOFOL;  Surgeon: Rachael Fee, MD;  Location: [facility] ENDOSCOPY;  Service: Endoscopy;  Laterality: N/A;   FOOT SURGERY       INCISIONAL HERNIA REPAIR N/A 11/09/2020    Procedure: OPEN INCISIONAL HERNIA REPAIR WITH MESH;  Surgeon: Axel Filler, MD;  Location: [facility] OR;  Service: General;  Laterality: N/A;   INJECTION OF SILICONE OIL Left 12/20/2022    Procedure: INJECTION OF SILICONE OIL;  Surgeon: Carmela Rima, MD;  Location[facility] OR;  Service: Ophthalmology;  Laterality: Left;  MAC WITH BLOCK   INSERTION OF MESH N/A 11/09/2020    Procedure: INSERTION OF MESH;  Surgeon: Axel Filler, MD;  Location: [facility] OR;  Service: General;  Laterality: N/A;   PARS PLANA VITRECTOMY Left 12/20/2022     Procedure: PARS PLANA VITRECTOMY WITH 25 GAUGE;  Surgeon: Carmela Rima, MD;  Location: [facility] OR;  Service: Ophthalmology;  Laterality: Left;   PHOTOCOAGULATION WITH LASER Left 12/20/2022    Procedure: PHOTOCOAGULATION WITH LASER;  Surgeon: Carmela Rima, MD;  Location: [facility] OR;  Service: Ophthalmology;  Laterality: Left;   POLYPECTOMY       POLYPECTOMY   06/18/2020    Procedure: POLYPECTOMY;  Surgeon: Rachael Fee, MD;  Location: [facility] ENDOSCOPY;  Service: Endoscopy;;   REPAIR OF COMPLEX TRACTION RETINAL DETACHMENT Left 12/20/2022  Procedure: REPAIR OF COMPLEX TRACTION RETINAL DETACHMENT;  Surgeon: Carmela Rima, MD;  Location: [facility] OR;  Service: Ophthalmology;  Laterality: Left;  MAC WITH BLOCK   ROBOT ASSISTED LAPAROSCOPIC NEPHRECTOMY Left 05/30/2018    Procedure: XI ROBOTIC ASSISTED LAPAROSCOPIC NEPHRECTOMY;  Surgeon: Sebastian Ache, MD;  Location: [facility] ORS;  Service: Urology;  Laterality: Left;               Family History  Problem Relation Age of Onset   Aneurysm Father 36        died of rupture   Colon cancer Sister            SOCIAL HISTORY: Social History         Socioeconomic History   Marital status: Divorced      Spouse name: Not on file   Number of children: Not on file   Years of education: Not on file   Highest education level: Not on file  Occupational History   Not on file  Tobacco Use   Smoking status: Never   Smokeless tobacco: Never  Vaping Use   Vaping status: Never Used  Substance and Sexual Activity   Alcohol use: Not Currently   Drug use: No   Sexual activity: Not on file  Other Topics Concern   Not on file  Social History Narrative   Not on file    Social Drivers of Health        Financial Resource Strain: Medium Risk (11/24/2022)    Overall Financial Resource Strain (CARDIA)     Difficulty of Paying Living Expenses: Somewhat hard  Food Insecurity: No Food Insecurity (11/24/2022)    Hunger Vital Sign     Worried About Running Out of Food in  the Last Year: Never true     Ran Out of Food in the Last Year: Never true  Transportation Needs: No Transportation Needs (11/24/2022)    PRAPARE - Therapist, art (Medical): No     Lack of Transportation (Non-Medical): No  Physical Activity: Inactive (11/24/2022)    Exercise Vital Sign     Days of Exercise per Week: 0 days     Minutes of Exercise per Session: 0 min  Stress: No Stress Concern Present (11/24/2022)    [employer] of Occupational Health - Occupational Stress Questionnaire     Feeling of Stress : Not at all  Social Connections: Moderately Isolated (11/24/2022)    Social Connection and Isolation Panel [NHANES]     Frequency of Communication with Friends and Family: More than three times a week     Frequency of Social Gatherings with Friends and Family: More than three times a week     Attends Religious Services: More than 4 times per year     Active Member of [employer] or Organizations: No     Attends Banker Meetings: Never     Marital Status: Divorced  Catering manager Violence: Not At Risk (11/24/2022)    Humiliation, Afraid, Rape, and Kick questionnaire     Fear of Current or Ex-Partner: No     Emotionally Abused: No     Physically Abused: No     Sexually Abused: No      Allergies       Allergies  Allergen Reactions   Ivp Dye [Iodinated Contrast Media] Other (See Comments)      Per patient's Nephrologist, he doesn't want the patient exposed to ANY dye because of issues with his kidneys  Hydrocodone        Caused hands and feet to peel    Phentermine Swelling   Amlodipine Swelling   Lisinopril Cough   Red Dye #40 (Allura Red) Other (See Comments)      NO dye of any kind (issues with his kidneys)              Current Outpatient Medications  Medication Sig Dispense Refill   acetaminophen (TYLENOL) 325 MG tablet Take 2 tablets (650 mg total) by mouth every 6 (six) hours as needed for mild pain.       albuterol (VENTOLIN  HFA) 108 (90 Base) MCG/ACT inhaler Inhale 2 puffs into the lungs every 6 (six) hours as needed for wheezing or shortness of breath. 8 g 0   calcium acetate (PHOSLO) 667 MG capsule Take 2,001 mg by mouth 3 (three) times daily with meals.        carvedilol (COREG) 6.25 MG tablet Take 6.25 mg by mouth 2 (two) times daily.       famotidine (PEPCID) 20 MG tablet One after supper 30 tablet 11   fluticasone (FLONASE) 50 MCG/ACT nasal spray Place 2 sprays into both nostrils as needed for allergies. 16 g 1   folic acid-vitamin b complex-vitamin c-selenium-zinc (DIALYVITE) 3 MG TABS tablet Take 1 tablet by mouth daily.       furosemide (LASIX) 80 MG tablet Take 1 tablet (80 mg total) by mouth daily. 90 tablet 0   gabapentin (NEURONTIN) 300 MG capsule Take 1 capsule (300 mg total) by mouth 2 (two) times daily. 60 capsule 2   hydrALAZINE (APRESOLINE) 100 MG tablet Take 1 tablet (100 mg total) by mouth 3 (three) times daily. 90 tablet 2   pantoprazole (PROTONIX) 40 MG tablet One twice daily (take after dialysis) 30 tablet 0   pravastatin (PRAVACHOL) 40 MG tablet Take 1 tablet (40 mg total) by mouth every evening. 90 tablet 3   Tiotropium Bromide-Olodaterol (STIOLTO RESPIMAT) 2.5-2.5 MCG/ACT AERS 2 puffs each am 4 g 2   Tiotropium Bromide-Olodaterol (STIOLTO RESPIMAT) 2.5-2.5 MCG/ACT AERS Inhale 2 puffs into the lungs daily.          No current facility-administered medications for this visit.        REVIEW OF SYSTEMS:  [X]  denotes positive finding, [ ]  denotes negative finding Cardiac   Comments:  Chest pain or chest pressure:      Shortness of breath upon exertion:      Short of breath when lying flat:      Irregular heart rhythm:             Vascular      Pain in calf, thigh, or hip brought on by ambulation:      Pain in feet at night that wakes you up from your sleep:       Blood clot in your veins:      Leg swelling:              Pulmonary      Oxygen at home:      Productive cough:        Wheezing:              Neurologic      Sudden weakness in arms or legs:       Sudden numbness in arms or legs:       Sudden onset of difficulty speaking or slurred speech:      Temporary loss of vision in one  eye:       Problems with dizziness:              Gastrointestinal      Blood in stool:       Vomited blood:              Genitourinary      Burning when urinating:       Blood in urine:             Psychiatric      Major depression:              Hematologic      Bleeding problems:      Problems with blood clotting too easily:             Skin      Rashes or ulcers:             Constitutional      Fever or chills:          PHYSICAL EXAM:    Vitals:    11/17/23 0856  BP: (!) 150/85  Pulse: 69  Resp: 18  Temp: 98.1 F (36.7 C)  TempSrc: Temporal  SpO2: 98%  Weight: 210 lb 4.8 oz (95.4 kg)  Height: 5\' 9"  (1.753 m)      GENERAL: The patient is a well-nourished male, in no acute distress. The vital signs are documented above. CARDIAC: There is a regular rate and rhythm.  VASCULAR:  Left forearm AV graft is pictured with large ulcer with and likely pseudoaneurysm Left forearm graft with good thrill  PULMONARY: No respiratory distress. ABDOMEN: Soft and non-tender. MUSCULOSKELETAL: There are no major deformities or cyanosis. NEUROLOGIC: No focal weakness or paresthesias are detected. PSYCHIATRIC: The patient has a normal affect.      DATA:    N/A   Assessment/Plan:   68 y.o. 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  He has a left forearm loop graft placed by Dr. Arbie Cookey in 2019.    Given the large ulcer as pictured above over his left forearm AVG, I have recommended left arm AV graft revision.  I discussed this would likely be done within interposition given that this is an AV graft and  would not be amendable to plication.  It looks like they would have enough room to access this laterally in the forearm for dialysis and I have asked him to discuss with his HD nurses tomorrow to ensure this is an option.  Otherwise we will have to place a TDC.  Risk benefits discussed.  Discussed this is to reduce his bleeding risk.  Will need one months to heal.  Will schedule next week in OR.     Cephus Shelling, MD [facility] [place] Office: [phone]

## 2023-11-23 ENCOUNTER — Encounter (HOSPITAL_COMMUNITY): Payer: Self-pay | Admitting: Vascular Surgery

## 2023-11-23 DIAGNOSIS — D689 Coagulation defect, unspecified: Secondary | ICD-10-CM | POA: Diagnosis not present

## 2023-11-23 DIAGNOSIS — N2581 Secondary hyperparathyroidism of renal origin: Secondary | ICD-10-CM | POA: Diagnosis not present

## 2023-11-23 DIAGNOSIS — N186 End stage renal disease: Secondary | ICD-10-CM | POA: Diagnosis not present

## 2023-11-23 DIAGNOSIS — Z992 Dependence on renal dialysis: Secondary | ICD-10-CM | POA: Diagnosis not present

## 2023-11-23 NOTE — Anesthesia Postprocedure Evaluation (Signed)
 Anesthesia Post Note  Patient: Charles Hart  Procedure(s) Performed: REVISON OF ARTERIOVENOUS GRAFT WITH INTERPOSITION (Left)     Patient location during evaluation: PACU Anesthesia Type: Regional Level of consciousness: awake and alert Pain management: pain level controlled Vital Signs Assessment: post-procedure vital signs reviewed and stable Respiratory status: spontaneous breathing, nonlabored ventilation, respiratory function stable and patient connected to nasal cannula oxygen Cardiovascular status: stable and blood pressure returned to baseline Postop Assessment: no apparent nausea or vomiting Anesthetic complications: no   No notable events documented.  Last Vitals:  Vitals:   11/22/23 1200 11/22/23 1215  BP: 109/73 120/77  Pulse: 84 82  Resp: 18 (!) 21  Temp:  36.6 C  SpO2: 97% 95%    Last Pain:  Vitals:   11/22/23 1215  TempSrc:   PainSc: 0-No pain                 Kennieth Rad

## 2023-11-25 DIAGNOSIS — N2581 Secondary hyperparathyroidism of renal origin: Secondary | ICD-10-CM | POA: Diagnosis not present

## 2023-11-25 DIAGNOSIS — D689 Coagulation defect, unspecified: Secondary | ICD-10-CM | POA: Diagnosis not present

## 2023-11-25 DIAGNOSIS — Z992 Dependence on renal dialysis: Secondary | ICD-10-CM | POA: Diagnosis not present

## 2023-11-25 DIAGNOSIS — N186 End stage renal disease: Secondary | ICD-10-CM | POA: Diagnosis not present

## 2023-11-28 DIAGNOSIS — N2581 Secondary hyperparathyroidism of renal origin: Secondary | ICD-10-CM | POA: Diagnosis not present

## 2023-11-28 DIAGNOSIS — D689 Coagulation defect, unspecified: Secondary | ICD-10-CM | POA: Diagnosis not present

## 2023-11-28 DIAGNOSIS — Z992 Dependence on renal dialysis: Secondary | ICD-10-CM | POA: Diagnosis not present

## 2023-11-28 DIAGNOSIS — N186 End stage renal disease: Secondary | ICD-10-CM | POA: Diagnosis not present

## 2023-11-30 DIAGNOSIS — D689 Coagulation defect, unspecified: Secondary | ICD-10-CM | POA: Diagnosis not present

## 2023-11-30 DIAGNOSIS — N2581 Secondary hyperparathyroidism of renal origin: Secondary | ICD-10-CM | POA: Diagnosis not present

## 2023-11-30 DIAGNOSIS — Z992 Dependence on renal dialysis: Secondary | ICD-10-CM | POA: Diagnosis not present

## 2023-11-30 DIAGNOSIS — N186 End stage renal disease: Secondary | ICD-10-CM | POA: Diagnosis not present

## 2023-12-02 DIAGNOSIS — D689 Coagulation defect, unspecified: Secondary | ICD-10-CM | POA: Diagnosis not present

## 2023-12-02 DIAGNOSIS — Z992 Dependence on renal dialysis: Secondary | ICD-10-CM | POA: Diagnosis not present

## 2023-12-02 DIAGNOSIS — N2581 Secondary hyperparathyroidism of renal origin: Secondary | ICD-10-CM | POA: Diagnosis not present

## 2023-12-02 DIAGNOSIS — N186 End stage renal disease: Secondary | ICD-10-CM | POA: Diagnosis not present

## 2023-12-05 DIAGNOSIS — D689 Coagulation defect, unspecified: Secondary | ICD-10-CM | POA: Diagnosis not present

## 2023-12-05 DIAGNOSIS — Z992 Dependence on renal dialysis: Secondary | ICD-10-CM | POA: Diagnosis not present

## 2023-12-05 DIAGNOSIS — N2581 Secondary hyperparathyroidism of renal origin: Secondary | ICD-10-CM | POA: Diagnosis not present

## 2023-12-05 DIAGNOSIS — N186 End stage renal disease: Secondary | ICD-10-CM | POA: Diagnosis not present

## 2023-12-07 DIAGNOSIS — Z992 Dependence on renal dialysis: Secondary | ICD-10-CM | POA: Diagnosis not present

## 2023-12-07 DIAGNOSIS — N186 End stage renal disease: Secondary | ICD-10-CM | POA: Diagnosis not present

## 2023-12-07 DIAGNOSIS — D689 Coagulation defect, unspecified: Secondary | ICD-10-CM | POA: Diagnosis not present

## 2023-12-07 DIAGNOSIS — N2581 Secondary hyperparathyroidism of renal origin: Secondary | ICD-10-CM | POA: Diagnosis not present

## 2023-12-09 DIAGNOSIS — Z992 Dependence on renal dialysis: Secondary | ICD-10-CM | POA: Diagnosis not present

## 2023-12-09 DIAGNOSIS — N186 End stage renal disease: Secondary | ICD-10-CM | POA: Diagnosis not present

## 2023-12-09 DIAGNOSIS — D689 Coagulation defect, unspecified: Secondary | ICD-10-CM | POA: Diagnosis not present

## 2023-12-09 DIAGNOSIS — N2581 Secondary hyperparathyroidism of renal origin: Secondary | ICD-10-CM | POA: Diagnosis not present

## 2023-12-12 DIAGNOSIS — D689 Coagulation defect, unspecified: Secondary | ICD-10-CM | POA: Diagnosis not present

## 2023-12-12 DIAGNOSIS — N2581 Secondary hyperparathyroidism of renal origin: Secondary | ICD-10-CM | POA: Diagnosis not present

## 2023-12-12 DIAGNOSIS — N186 End stage renal disease: Secondary | ICD-10-CM | POA: Diagnosis not present

## 2023-12-12 DIAGNOSIS — Z992 Dependence on renal dialysis: Secondary | ICD-10-CM | POA: Diagnosis not present

## 2023-12-13 DIAGNOSIS — Z992 Dependence on renal dialysis: Secondary | ICD-10-CM | POA: Diagnosis not present

## 2023-12-13 DIAGNOSIS — N186 End stage renal disease: Secondary | ICD-10-CM | POA: Diagnosis not present

## 2023-12-13 DIAGNOSIS — E1129 Type 2 diabetes mellitus with other diabetic kidney complication: Secondary | ICD-10-CM | POA: Diagnosis not present

## 2023-12-14 DIAGNOSIS — N186 End stage renal disease: Secondary | ICD-10-CM | POA: Diagnosis not present

## 2023-12-14 DIAGNOSIS — N2581 Secondary hyperparathyroidism of renal origin: Secondary | ICD-10-CM | POA: Diagnosis not present

## 2023-12-14 DIAGNOSIS — Z992 Dependence on renal dialysis: Secondary | ICD-10-CM | POA: Diagnosis not present

## 2023-12-14 DIAGNOSIS — D689 Coagulation defect, unspecified: Secondary | ICD-10-CM | POA: Diagnosis not present

## 2023-12-16 DIAGNOSIS — D689 Coagulation defect, unspecified: Secondary | ICD-10-CM | POA: Diagnosis not present

## 2023-12-16 DIAGNOSIS — N2581 Secondary hyperparathyroidism of renal origin: Secondary | ICD-10-CM | POA: Diagnosis not present

## 2023-12-16 DIAGNOSIS — Z992 Dependence on renal dialysis: Secondary | ICD-10-CM | POA: Diagnosis not present

## 2023-12-16 DIAGNOSIS — N186 End stage renal disease: Secondary | ICD-10-CM | POA: Diagnosis not present

## 2023-12-18 DIAGNOSIS — N2581 Secondary hyperparathyroidism of renal origin: Secondary | ICD-10-CM | POA: Diagnosis not present

## 2023-12-18 DIAGNOSIS — Z992 Dependence on renal dialysis: Secondary | ICD-10-CM | POA: Diagnosis not present

## 2023-12-18 DIAGNOSIS — D689 Coagulation defect, unspecified: Secondary | ICD-10-CM | POA: Diagnosis not present

## 2023-12-18 DIAGNOSIS — N186 End stage renal disease: Secondary | ICD-10-CM | POA: Diagnosis not present

## 2023-12-19 ENCOUNTER — Ambulatory Visit: Attending: Vascular Surgery | Admitting: Physician Assistant

## 2023-12-19 ENCOUNTER — Encounter: Payer: Self-pay | Admitting: Vascular Surgery

## 2023-12-19 VITALS — BP 151/85 | HR 68 | Temp 97.5°F | Ht 69.0 in | Wt 214.0 lb

## 2023-12-19 DIAGNOSIS — N186 End stage renal disease: Secondary | ICD-10-CM

## 2023-12-19 DIAGNOSIS — Z992 Dependence on renal dialysis: Secondary | ICD-10-CM

## 2023-12-19 NOTE — Progress Notes (Signed)
 POST OPERATIVE OFFICE NOTE    CC:  F/u for surgery  HPI:  This is a 68 y.o. male who is s/p Revision of left forearm loop graft with interposition (6 mm Gore-Tex)  on 11/22/2023 by Dr. Fulton Job   Pt states he does not have pain/numbness in the left hand.  He states they are using the one side of the graft (lateral) and it is working well.    The pt is on dialysis T/T/S at [address] location.   Allergies  Allergen Reactions   Ivp Dye [Iodinated Contrast Media] Other (See Comments)    Per patient's Nephrologist, he doesn't want the patient exposed to ANY dye because of issues with his kidneys   Hydrocodone      Caused hands and feet to peel    Phentermine Swelling   Amlodipine  Swelling   Lisinopril  Cough   Red Dye #40 (Allura Red) Other (See Comments)    NO dye of any kind (issues with his kidneys)    Current Outpatient Medications  Medication Sig Dispense Refill   acetaminophen  (TYLENOL ) 325 MG tablet Take 2 tablets (650 mg total) by mouth every 6 (six) hours as needed for mild pain.     calcium  acetate (PHOSLO ) 667 MG capsule Take 2,668 mg by mouth 3 (three) times daily with meals.     carvedilol  (COREG ) 6.25 MG tablet Take 6.25 mg by mouth 2 (two) times daily.     fluticasone  (FLONASE ) 50 MCG/ACT nasal spray Place 2 sprays into both nostrils as needed for allergies. 16 g 1   folic acid -vitamin b complex-vitamin c-selenium-zinc (DIALYVITE ) 3 MG TABS tablet Take 1 tablet by mouth daily.     furosemide  (LASIX ) 80 MG tablet Take 1 tablet (80 mg total) by mouth daily. (Patient taking differently: Take 80 mg by mouth 2 (two) times daily.) 90 tablet 0   gabapentin  (NEURONTIN ) 300 MG capsule Take 1 capsule (300 mg total) by mouth 2 (two) times daily. 60 capsule 2   hydrALAZINE  (APRESOLINE ) 100 MG tablet Take 1 tablet (100 mg total) by mouth 3 (three) times daily. 90 tablet 2   ketorolac (ACULAR) 0.5 % ophthalmic solution Place 1 drop into the left eye 3 (three) times daily as  needed (help see better).     losartan  (COZAAR ) 25 MG tablet Take 25 mg by mouth daily.     pantoprazole  (PROTONIX ) 40 MG tablet One twice daily (take after dialysis) 30 tablet 0   pravastatin  (PRAVACHOL ) 40 MG tablet Take 1 tablet (40 mg total) by mouth every evening. 90 tablet 3   Tiotropium Bromide-Olodaterol (STIOLTO RESPIMAT ) 2.5-2.5 MCG/ACT AERS 2 puffs each am (Patient taking differently: Inhale 2 puffs into the lungs daily as needed (Shortness of breath).) 4 g 2   oxyCODONE  (ROXICODONE ) 5 MG immediate release tablet Take 1 tablet (5 mg total) by mouth every 6 (six) hours as needed. (Patient not taking: Reported on 12/19/2023) 20 tablet 0   No current facility-administered medications for this visit.     ROS:  See HPI  Physical Exam:  Today's Vitals   12/19/23 0836  BP: (!) 151/85  Pulse: 68  Temp: (!) 97.5 F (36.4 C)  TempSrc: Temporal  SpO2: 97%  Weight: 214 lb (97.1 kg)  Height: 5\' 9"  (1.753 m)   Body mass index is 31.6 kg/m.   Incision:  both incisions have healed nicely Extremities:   There is a palpable left radial pulse.   Motor and sensory are in tact.  There is a thrill/bruit present.  Access is  easily palpable    Assessment/Plan:  This is a 68 y.o. male who is s/p: Revision of left forearm loop graft with interposition (6 mm Gore-Tex)   -the pt does not have evidence of steal. -ok to start sticking the medial side of the graft 12/23/2023.  -he had concerns about the medial incision and that it felt like there was thickness in this area-it felt as though there were two grafts side by side.  Discussed there could be very small hematoma as well and if so, this would resolve over time.  I did not see anything concerning.  -discussed with pt that access does not last forever and will need intervention or even new access at some point.  -the pt will follow up as needed. 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    Maryanna Smart,  [facility] [phone]  Clinic MD:  Edgardo Goodwill

## 2023-12-19 NOTE — Progress Notes (Deleted)
  POST OPERATIVE OFFICE NOTE    CC:  F/u for surgery  HPI:  This is a 68 y.o. male who is ESRD on HD Tuesday Thursday Saturday.  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 Fulton Job.    Pt returns today for follow up.  Pt states ***   Allergies  Allergen Reactions   Ivp Dye [Iodinated Contrast Media] Other (See Comments)    Per patient's Nephrologist, he doesn't want the patient exposed to ANY dye because of issues with his kidneys   Hydrocodone      Caused hands and feet to peel    Phentermine Swelling   Amlodipine  Swelling   Lisinopril  Cough   Red Dye #40 (Allura Red) Other (See Comments)    NO dye of any kind (issues with his kidneys)    Current Outpatient Medications  Medication Sig Dispense Refill   acetaminophen  (TYLENOL ) 325 MG tablet Take 2 tablets (650 mg total) by mouth every 6 (six) hours as needed for mild pain.     calcium  acetate (PHOSLO ) 667 MG capsule Take 2,668 mg by mouth 3 (three) times daily with meals.     carvedilol  (COREG ) 6.25 MG tablet Take 6.25 mg by mouth 2 (two) times daily.     fluticasone  (FLONASE ) 50 MCG/ACT nasal spray Place 2 sprays into both nostrils as needed for allergies. 16 g 1   folic acid -vitamin b complex-vitamin c-selenium-zinc (DIALYVITE ) 3 MG TABS tablet Take 1 tablet by mouth daily.     furosemide  (LASIX ) 80 MG tablet Take 1 tablet (80 mg total) by mouth daily. (Patient taking differently: Take 80 mg by mouth 2 (two) times daily.) 90 tablet 0   gabapentin  (NEURONTIN ) 300 MG capsule Take 1 capsule (300 mg total) by mouth 2 (two) times daily. 60 capsule 2   hydrALAZINE  (APRESOLINE ) 100 MG tablet Take 1 tablet (100 mg total) by mouth 3 (three) times daily. 90 tablet 2   ketorolac (ACULAR) 0.5 % ophthalmic solution Place 1 drop into the left eye 3 (three) times daily as needed (help see better).     losartan  (COZAAR ) 25 MG tablet Take 25 mg by mouth daily.      pantoprazole  (PROTONIX ) 40 MG tablet One twice daily (take after dialysis) 30 tablet 0   pravastatin  (PRAVACHOL ) 40 MG tablet Take 1 tablet (40 mg total) by mouth every evening. 90 tablet 3   Tiotropium Bromide-Olodaterol (STIOLTO RESPIMAT ) 2.5-2.5 MCG/ACT AERS 2 puffs each am (Patient taking differently: Inhale 2 puffs into the lungs daily as needed (Shortness of breath).) 4 g 2   oxyCODONE  (ROXICODONE ) 5 MG immediate release tablet Take 1 tablet (5 mg total) by mouth every 6 (six) hours as needed. (Patient not taking: Reported on 12/19/2023) 20 tablet 0   No current facility-administered medications for this visit.     ROS:  See HPI  Physical Exam:  Pre-op    Incision:  *** Extremities:  *** Neuro: *** Abdomen:  ***    Assessment/Plan:  This is a 68 y.o. male who is s/p:revision left forearm av graft for HD   -***  Rocky Cipro PA-C [facility] [phone]   Clinic MD:  Edgardo Goodwill

## 2023-12-21 DIAGNOSIS — N186 End stage renal disease: Secondary | ICD-10-CM | POA: Diagnosis not present

## 2023-12-21 DIAGNOSIS — Z992 Dependence on renal dialysis: Secondary | ICD-10-CM | POA: Diagnosis not present

## 2023-12-21 DIAGNOSIS — N2581 Secondary hyperparathyroidism of renal origin: Secondary | ICD-10-CM | POA: Diagnosis not present

## 2023-12-21 DIAGNOSIS — D689 Coagulation defect, unspecified: Secondary | ICD-10-CM | POA: Diagnosis not present

## 2023-12-23 DIAGNOSIS — Z992 Dependence on renal dialysis: Secondary | ICD-10-CM | POA: Diagnosis not present

## 2023-12-23 DIAGNOSIS — N186 End stage renal disease: Secondary | ICD-10-CM | POA: Diagnosis not present

## 2023-12-23 DIAGNOSIS — N2581 Secondary hyperparathyroidism of renal origin: Secondary | ICD-10-CM | POA: Diagnosis not present

## 2023-12-23 DIAGNOSIS — D689 Coagulation defect, unspecified: Secondary | ICD-10-CM | POA: Diagnosis not present

## 2023-12-26 DIAGNOSIS — Z992 Dependence on renal dialysis: Secondary | ICD-10-CM | POA: Diagnosis not present

## 2023-12-26 DIAGNOSIS — N186 End stage renal disease: Secondary | ICD-10-CM | POA: Diagnosis not present

## 2023-12-26 DIAGNOSIS — D689 Coagulation defect, unspecified: Secondary | ICD-10-CM | POA: Diagnosis not present

## 2023-12-26 DIAGNOSIS — N2581 Secondary hyperparathyroidism of renal origin: Secondary | ICD-10-CM | POA: Diagnosis not present

## 2023-12-28 DIAGNOSIS — D689 Coagulation defect, unspecified: Secondary | ICD-10-CM | POA: Diagnosis not present

## 2023-12-28 DIAGNOSIS — Z992 Dependence on renal dialysis: Secondary | ICD-10-CM | POA: Diagnosis not present

## 2023-12-28 DIAGNOSIS — N2581 Secondary hyperparathyroidism of renal origin: Secondary | ICD-10-CM | POA: Diagnosis not present

## 2023-12-28 DIAGNOSIS — N186 End stage renal disease: Secondary | ICD-10-CM | POA: Diagnosis not present

## 2023-12-30 DIAGNOSIS — D689 Coagulation defect, unspecified: Secondary | ICD-10-CM | POA: Diagnosis not present

## 2023-12-30 DIAGNOSIS — N186 End stage renal disease: Secondary | ICD-10-CM | POA: Diagnosis not present

## 2023-12-30 DIAGNOSIS — Z992 Dependence on renal dialysis: Secondary | ICD-10-CM | POA: Diagnosis not present

## 2023-12-30 DIAGNOSIS — N2581 Secondary hyperparathyroidism of renal origin: Secondary | ICD-10-CM | POA: Diagnosis not present

## 2024-01-02 DIAGNOSIS — N2581 Secondary hyperparathyroidism of renal origin: Secondary | ICD-10-CM | POA: Diagnosis not present

## 2024-01-02 DIAGNOSIS — Z992 Dependence on renal dialysis: Secondary | ICD-10-CM | POA: Diagnosis not present

## 2024-01-02 DIAGNOSIS — N186 End stage renal disease: Secondary | ICD-10-CM | POA: Diagnosis not present

## 2024-01-02 DIAGNOSIS — D689 Coagulation defect, unspecified: Secondary | ICD-10-CM | POA: Diagnosis not present

## 2024-01-04 DIAGNOSIS — N2581 Secondary hyperparathyroidism of renal origin: Secondary | ICD-10-CM | POA: Diagnosis not present

## 2024-01-04 DIAGNOSIS — Z992 Dependence on renal dialysis: Secondary | ICD-10-CM | POA: Diagnosis not present

## 2024-01-04 DIAGNOSIS — N186 End stage renal disease: Secondary | ICD-10-CM | POA: Diagnosis not present

## 2024-01-04 DIAGNOSIS — D689 Coagulation defect, unspecified: Secondary | ICD-10-CM | POA: Diagnosis not present

## 2024-01-06 DIAGNOSIS — Z992 Dependence on renal dialysis: Secondary | ICD-10-CM | POA: Diagnosis not present

## 2024-01-06 DIAGNOSIS — N2581 Secondary hyperparathyroidism of renal origin: Secondary | ICD-10-CM | POA: Diagnosis not present

## 2024-01-06 DIAGNOSIS — N186 End stage renal disease: Secondary | ICD-10-CM | POA: Diagnosis not present

## 2024-01-06 DIAGNOSIS — D689 Coagulation defect, unspecified: Secondary | ICD-10-CM | POA: Diagnosis not present

## 2024-01-09 DIAGNOSIS — N186 End stage renal disease: Secondary | ICD-10-CM | POA: Diagnosis not present

## 2024-01-09 DIAGNOSIS — N2581 Secondary hyperparathyroidism of renal origin: Secondary | ICD-10-CM | POA: Diagnosis not present

## 2024-01-09 DIAGNOSIS — Z992 Dependence on renal dialysis: Secondary | ICD-10-CM | POA: Diagnosis not present

## 2024-01-09 DIAGNOSIS — D689 Coagulation defect, unspecified: Secondary | ICD-10-CM | POA: Diagnosis not present

## 2024-01-11 DIAGNOSIS — N186 End stage renal disease: Secondary | ICD-10-CM | POA: Diagnosis not present

## 2024-01-11 DIAGNOSIS — Z992 Dependence on renal dialysis: Secondary | ICD-10-CM | POA: Diagnosis not present

## 2024-01-11 DIAGNOSIS — N2581 Secondary hyperparathyroidism of renal origin: Secondary | ICD-10-CM | POA: Diagnosis not present

## 2024-01-11 DIAGNOSIS — D689 Coagulation defect, unspecified: Secondary | ICD-10-CM | POA: Diagnosis not present

## 2024-01-13 DIAGNOSIS — Z992 Dependence on renal dialysis: Secondary | ICD-10-CM | POA: Diagnosis not present

## 2024-01-13 DIAGNOSIS — D689 Coagulation defect, unspecified: Secondary | ICD-10-CM | POA: Diagnosis not present

## 2024-01-13 DIAGNOSIS — N2581 Secondary hyperparathyroidism of renal origin: Secondary | ICD-10-CM | POA: Diagnosis not present

## 2024-01-13 DIAGNOSIS — N186 End stage renal disease: Secondary | ICD-10-CM | POA: Diagnosis not present

## 2024-01-13 DIAGNOSIS — E1129 Type 2 diabetes mellitus with other diabetic kidney complication: Secondary | ICD-10-CM | POA: Diagnosis not present

## 2024-01-16 DIAGNOSIS — N2581 Secondary hyperparathyroidism of renal origin: Secondary | ICD-10-CM | POA: Diagnosis not present

## 2024-01-16 DIAGNOSIS — N186 End stage renal disease: Secondary | ICD-10-CM | POA: Diagnosis not present

## 2024-01-16 DIAGNOSIS — Z992 Dependence on renal dialysis: Secondary | ICD-10-CM | POA: Diagnosis not present

## 2024-01-16 DIAGNOSIS — D689 Coagulation defect, unspecified: Secondary | ICD-10-CM | POA: Diagnosis not present

## 2024-01-18 DIAGNOSIS — N186 End stage renal disease: Secondary | ICD-10-CM | POA: Diagnosis not present

## 2024-01-18 DIAGNOSIS — N2581 Secondary hyperparathyroidism of renal origin: Secondary | ICD-10-CM | POA: Diagnosis not present

## 2024-01-18 DIAGNOSIS — D689 Coagulation defect, unspecified: Secondary | ICD-10-CM | POA: Diagnosis not present

## 2024-01-18 DIAGNOSIS — Z992 Dependence on renal dialysis: Secondary | ICD-10-CM | POA: Diagnosis not present

## 2024-01-22 ENCOUNTER — Other Ambulatory Visit: Payer: Self-pay

## 2024-01-22 MED ORDER — FUROSEMIDE 80 MG PO TABS: 80.0000 mg | ORAL_TABLET | Freq: Every day | ORAL | 0 refills | Status: DC

## 2024-01-23 DIAGNOSIS — N186 End stage renal disease: Secondary | ICD-10-CM | POA: Diagnosis not present

## 2024-01-23 DIAGNOSIS — N2581 Secondary hyperparathyroidism of renal origin: Secondary | ICD-10-CM | POA: Diagnosis not present

## 2024-01-23 DIAGNOSIS — Z992 Dependence on renal dialysis: Secondary | ICD-10-CM | POA: Diagnosis not present

## 2024-01-23 DIAGNOSIS — D689 Coagulation defect, unspecified: Secondary | ICD-10-CM | POA: Diagnosis not present

## 2024-01-25 DIAGNOSIS — N186 End stage renal disease: Secondary | ICD-10-CM | POA: Diagnosis not present

## 2024-01-25 DIAGNOSIS — Z992 Dependence on renal dialysis: Secondary | ICD-10-CM | POA: Diagnosis not present

## 2024-01-25 DIAGNOSIS — D689 Coagulation defect, unspecified: Secondary | ICD-10-CM | POA: Diagnosis not present

## 2024-01-25 DIAGNOSIS — N2581 Secondary hyperparathyroidism of renal origin: Secondary | ICD-10-CM | POA: Diagnosis not present

## 2024-01-27 DIAGNOSIS — N186 End stage renal disease: Secondary | ICD-10-CM | POA: Diagnosis not present

## 2024-01-27 DIAGNOSIS — D689 Coagulation defect, unspecified: Secondary | ICD-10-CM | POA: Diagnosis not present

## 2024-01-27 DIAGNOSIS — Z992 Dependence on renal dialysis: Secondary | ICD-10-CM | POA: Diagnosis not present

## 2024-01-27 DIAGNOSIS — N2581 Secondary hyperparathyroidism of renal origin: Secondary | ICD-10-CM | POA: Diagnosis not present

## 2024-01-30 DIAGNOSIS — D689 Coagulation defect, unspecified: Secondary | ICD-10-CM | POA: Diagnosis not present

## 2024-01-30 DIAGNOSIS — N2581 Secondary hyperparathyroidism of renal origin: Secondary | ICD-10-CM | POA: Diagnosis not present

## 2024-01-30 DIAGNOSIS — Z992 Dependence on renal dialysis: Secondary | ICD-10-CM | POA: Diagnosis not present

## 2024-01-30 DIAGNOSIS — N186 End stage renal disease: Secondary | ICD-10-CM | POA: Diagnosis not present

## 2024-01-31 DIAGNOSIS — E1122 Type 2 diabetes mellitus with diabetic chronic kidney disease: Secondary | ICD-10-CM | POA: Diagnosis not present

## 2024-01-31 DIAGNOSIS — Z136 Encounter for screening for cardiovascular disorders: Secondary | ICD-10-CM | POA: Diagnosis not present

## 2024-01-31 DIAGNOSIS — I5082 Biventricular heart failure: Secondary | ICD-10-CM | POA: Diagnosis not present

## 2024-01-31 DIAGNOSIS — N186 End stage renal disease: Secondary | ICD-10-CM | POA: Diagnosis not present

## 2024-01-31 DIAGNOSIS — Z992 Dependence on renal dialysis: Secondary | ICD-10-CM | POA: Diagnosis not present

## 2024-01-31 DIAGNOSIS — Z8673 Personal history of transient ischemic attack (TIA), and cerebral infarction without residual deficits: Secondary | ICD-10-CM | POA: Diagnosis not present

## 2024-01-31 DIAGNOSIS — Z8249 Family history of ischemic heart disease and other diseases of the circulatory system: Secondary | ICD-10-CM | POA: Diagnosis not present

## 2024-01-31 DIAGNOSIS — I5020 Unspecified systolic (congestive) heart failure: Secondary | ICD-10-CM | POA: Diagnosis not present

## 2024-01-31 DIAGNOSIS — I272 Pulmonary hypertension, unspecified: Secondary | ICD-10-CM | POA: Diagnosis not present

## 2024-01-31 DIAGNOSIS — I428 Other cardiomyopathies: Secondary | ICD-10-CM | POA: Diagnosis not present

## 2024-01-31 DIAGNOSIS — G4733 Obstructive sleep apnea (adult) (pediatric): Secondary | ICD-10-CM | POA: Diagnosis not present

## 2024-01-31 DIAGNOSIS — I132 Hypertensive heart and chronic kidney disease with heart failure and with stage 5 chronic kidney disease, or end stage renal disease: Secondary | ICD-10-CM | POA: Diagnosis not present

## 2024-01-31 DIAGNOSIS — I5022 Chronic systolic (congestive) heart failure: Secondary | ICD-10-CM | POA: Diagnosis not present

## 2024-02-01 ENCOUNTER — Telehealth: Payer: Self-pay | Admitting: *Deleted

## 2024-02-01 DIAGNOSIS — N186 End stage renal disease: Secondary | ICD-10-CM | POA: Diagnosis not present

## 2024-02-01 DIAGNOSIS — Z992 Dependence on renal dialysis: Secondary | ICD-10-CM | POA: Diagnosis not present

## 2024-02-01 DIAGNOSIS — D689 Coagulation defect, unspecified: Secondary | ICD-10-CM | POA: Diagnosis not present

## 2024-02-01 DIAGNOSIS — N2581 Secondary hyperparathyroidism of renal origin: Secondary | ICD-10-CM | POA: Diagnosis not present

## 2024-02-01 NOTE — Telephone Encounter (Signed)
 Received faxed copy of results below (which was copied/pasted from Care Everywhere) Labs were obtained by [facility]       Contains abnormal data POC COOXIMETRY ASSESSMENT Order: [id number] Component Ref Range & Units 1 d ago  Total Hemoglobin 14.0 - 18.0 g/dL 40.9 Low   Oxyhemoglobin Fract 94.0 - 97.0 % 55.0 Low   O2 Content, Arterial 17.0 - 23.0 mL/dL 8.3 Low   Resulting Agency [facility]   Specimen Collected: 01/31/24 15:05   Performed by: Rojean Cleaves [facility] Last Resulted: 01/31/24 18:59  Received From: [facility]  Result Received: 02/01/24 13:19   View Encounter      Received Information  POC COOXIMETRY ASSESSMENT (Order [id number]

## 2024-02-01 NOTE — Telephone Encounter (Signed)
 Ok thank you, looks like they did a RHC and this was just being used to calculate cardiac output, I suspect it was auto forwarded to PCP.

## 2024-02-03 DIAGNOSIS — D689 Coagulation defect, unspecified: Secondary | ICD-10-CM | POA: Diagnosis not present

## 2024-02-03 DIAGNOSIS — N186 End stage renal disease: Secondary | ICD-10-CM | POA: Diagnosis not present

## 2024-02-03 DIAGNOSIS — N2581 Secondary hyperparathyroidism of renal origin: Secondary | ICD-10-CM | POA: Diagnosis not present

## 2024-02-03 DIAGNOSIS — Z992 Dependence on renal dialysis: Secondary | ICD-10-CM | POA: Diagnosis not present

## 2024-02-06 DIAGNOSIS — N186 End stage renal disease: Secondary | ICD-10-CM | POA: Diagnosis not present

## 2024-02-06 DIAGNOSIS — N2581 Secondary hyperparathyroidism of renal origin: Secondary | ICD-10-CM | POA: Diagnosis not present

## 2024-02-06 DIAGNOSIS — D689 Coagulation defect, unspecified: Secondary | ICD-10-CM | POA: Diagnosis not present

## 2024-02-06 DIAGNOSIS — Z992 Dependence on renal dialysis: Secondary | ICD-10-CM | POA: Diagnosis not present

## 2024-02-08 DIAGNOSIS — N186 End stage renal disease: Secondary | ICD-10-CM | POA: Diagnosis not present

## 2024-02-08 DIAGNOSIS — D689 Coagulation defect, unspecified: Secondary | ICD-10-CM | POA: Diagnosis not present

## 2024-02-08 DIAGNOSIS — N2581 Secondary hyperparathyroidism of renal origin: Secondary | ICD-10-CM | POA: Diagnosis not present

## 2024-02-08 DIAGNOSIS — Z992 Dependence on renal dialysis: Secondary | ICD-10-CM | POA: Diagnosis not present

## 2024-02-10 DIAGNOSIS — Z992 Dependence on renal dialysis: Secondary | ICD-10-CM | POA: Diagnosis not present

## 2024-02-10 DIAGNOSIS — N2581 Secondary hyperparathyroidism of renal origin: Secondary | ICD-10-CM | POA: Diagnosis not present

## 2024-02-10 DIAGNOSIS — N186 End stage renal disease: Secondary | ICD-10-CM | POA: Diagnosis not present

## 2024-02-10 DIAGNOSIS — D689 Coagulation defect, unspecified: Secondary | ICD-10-CM | POA: Diagnosis not present

## 2024-02-12 DIAGNOSIS — Z992 Dependence on renal dialysis: Secondary | ICD-10-CM | POA: Diagnosis not present

## 2024-02-12 DIAGNOSIS — E1129 Type 2 diabetes mellitus with other diabetic kidney complication: Secondary | ICD-10-CM | POA: Diagnosis not present

## 2024-02-12 DIAGNOSIS — N186 End stage renal disease: Secondary | ICD-10-CM | POA: Diagnosis not present

## 2024-02-13 DIAGNOSIS — N186 End stage renal disease: Secondary | ICD-10-CM | POA: Diagnosis not present

## 2024-02-13 DIAGNOSIS — D689 Coagulation defect, unspecified: Secondary | ICD-10-CM | POA: Diagnosis not present

## 2024-02-13 DIAGNOSIS — Z992 Dependence on renal dialysis: Secondary | ICD-10-CM | POA: Diagnosis not present

## 2024-02-13 DIAGNOSIS — N2581 Secondary hyperparathyroidism of renal origin: Secondary | ICD-10-CM | POA: Diagnosis not present

## 2024-02-15 DIAGNOSIS — D689 Coagulation defect, unspecified: Secondary | ICD-10-CM | POA: Diagnosis not present

## 2024-02-15 DIAGNOSIS — N186 End stage renal disease: Secondary | ICD-10-CM | POA: Diagnosis not present

## 2024-02-15 DIAGNOSIS — N2581 Secondary hyperparathyroidism of renal origin: Secondary | ICD-10-CM | POA: Diagnosis not present

## 2024-02-15 DIAGNOSIS — Z992 Dependence on renal dialysis: Secondary | ICD-10-CM | POA: Diagnosis not present

## 2024-02-17 DIAGNOSIS — D689 Coagulation defect, unspecified: Secondary | ICD-10-CM | POA: Diagnosis not present

## 2024-02-17 DIAGNOSIS — Z992 Dependence on renal dialysis: Secondary | ICD-10-CM | POA: Diagnosis not present

## 2024-02-17 DIAGNOSIS — N186 End stage renal disease: Secondary | ICD-10-CM | POA: Diagnosis not present

## 2024-02-17 DIAGNOSIS — N2581 Secondary hyperparathyroidism of renal origin: Secondary | ICD-10-CM | POA: Diagnosis not present

## 2024-02-20 DIAGNOSIS — N2581 Secondary hyperparathyroidism of renal origin: Secondary | ICD-10-CM | POA: Diagnosis not present

## 2024-02-20 DIAGNOSIS — N186 End stage renal disease: Secondary | ICD-10-CM | POA: Diagnosis not present

## 2024-02-20 DIAGNOSIS — D689 Coagulation defect, unspecified: Secondary | ICD-10-CM | POA: Diagnosis not present

## 2024-02-20 DIAGNOSIS — Z992 Dependence on renal dialysis: Secondary | ICD-10-CM | POA: Diagnosis not present

## 2024-02-22 DIAGNOSIS — N186 End stage renal disease: Secondary | ICD-10-CM | POA: Diagnosis not present

## 2024-02-22 DIAGNOSIS — Z992 Dependence on renal dialysis: Secondary | ICD-10-CM | POA: Diagnosis not present

## 2024-02-22 DIAGNOSIS — N2581 Secondary hyperparathyroidism of renal origin: Secondary | ICD-10-CM | POA: Diagnosis not present

## 2024-02-22 DIAGNOSIS — D689 Coagulation defect, unspecified: Secondary | ICD-10-CM | POA: Diagnosis not present

## 2024-02-22 LAB — LAB REPORT - SCANNED: A1c: 4.6

## 2024-02-24 DIAGNOSIS — Z992 Dependence on renal dialysis: Secondary | ICD-10-CM | POA: Diagnosis not present

## 2024-02-24 DIAGNOSIS — N186 End stage renal disease: Secondary | ICD-10-CM | POA: Diagnosis not present

## 2024-02-24 DIAGNOSIS — N2581 Secondary hyperparathyroidism of renal origin: Secondary | ICD-10-CM | POA: Diagnosis not present

## 2024-02-24 DIAGNOSIS — D689 Coagulation defect, unspecified: Secondary | ICD-10-CM | POA: Diagnosis not present

## 2024-02-27 DIAGNOSIS — N2581 Secondary hyperparathyroidism of renal origin: Secondary | ICD-10-CM | POA: Diagnosis not present

## 2024-02-27 DIAGNOSIS — N186 End stage renal disease: Secondary | ICD-10-CM | POA: Diagnosis not present

## 2024-02-27 DIAGNOSIS — D689 Coagulation defect, unspecified: Secondary | ICD-10-CM | POA: Diagnosis not present

## 2024-02-27 DIAGNOSIS — Z992 Dependence on renal dialysis: Secondary | ICD-10-CM | POA: Diagnosis not present

## 2024-02-29 DIAGNOSIS — D689 Coagulation defect, unspecified: Secondary | ICD-10-CM | POA: Diagnosis not present

## 2024-02-29 DIAGNOSIS — Z992 Dependence on renal dialysis: Secondary | ICD-10-CM | POA: Diagnosis not present

## 2024-02-29 DIAGNOSIS — N186 End stage renal disease: Secondary | ICD-10-CM | POA: Diagnosis not present

## 2024-02-29 DIAGNOSIS — N2581 Secondary hyperparathyroidism of renal origin: Secondary | ICD-10-CM | POA: Diagnosis not present

## 2024-03-02 DIAGNOSIS — N186 End stage renal disease: Secondary | ICD-10-CM | POA: Diagnosis not present

## 2024-03-02 DIAGNOSIS — N2581 Secondary hyperparathyroidism of renal origin: Secondary | ICD-10-CM | POA: Diagnosis not present

## 2024-03-02 DIAGNOSIS — D689 Coagulation defect, unspecified: Secondary | ICD-10-CM | POA: Diagnosis not present

## 2024-03-02 DIAGNOSIS — Z992 Dependence on renal dialysis: Secondary | ICD-10-CM | POA: Diagnosis not present

## 2024-03-05 DIAGNOSIS — Z992 Dependence on renal dialysis: Secondary | ICD-10-CM | POA: Diagnosis not present

## 2024-03-05 DIAGNOSIS — N186 End stage renal disease: Secondary | ICD-10-CM | POA: Diagnosis not present

## 2024-03-05 DIAGNOSIS — D689 Coagulation defect, unspecified: Secondary | ICD-10-CM | POA: Diagnosis not present

## 2024-03-05 DIAGNOSIS — N2581 Secondary hyperparathyroidism of renal origin: Secondary | ICD-10-CM | POA: Diagnosis not present

## 2024-03-07 DIAGNOSIS — Z992 Dependence on renal dialysis: Secondary | ICD-10-CM | POA: Diagnosis not present

## 2024-03-07 DIAGNOSIS — N2581 Secondary hyperparathyroidism of renal origin: Secondary | ICD-10-CM | POA: Diagnosis not present

## 2024-03-07 DIAGNOSIS — D689 Coagulation defect, unspecified: Secondary | ICD-10-CM | POA: Diagnosis not present

## 2024-03-07 DIAGNOSIS — N186 End stage renal disease: Secondary | ICD-10-CM | POA: Diagnosis not present

## 2024-03-09 DIAGNOSIS — Z992 Dependence on renal dialysis: Secondary | ICD-10-CM | POA: Diagnosis not present

## 2024-03-09 DIAGNOSIS — N186 End stage renal disease: Secondary | ICD-10-CM | POA: Diagnosis not present

## 2024-03-09 DIAGNOSIS — D689 Coagulation defect, unspecified: Secondary | ICD-10-CM | POA: Diagnosis not present

## 2024-03-09 DIAGNOSIS — N2581 Secondary hyperparathyroidism of renal origin: Secondary | ICD-10-CM | POA: Diagnosis not present

## 2024-03-11 ENCOUNTER — Other Ambulatory Visit: Payer: Self-pay

## 2024-03-11 DIAGNOSIS — Z992 Dependence on renal dialysis: Secondary | ICD-10-CM | POA: Diagnosis not present

## 2024-03-11 DIAGNOSIS — N186 End stage renal disease: Secondary | ICD-10-CM | POA: Diagnosis not present

## 2024-03-11 DIAGNOSIS — I5022 Chronic systolic (congestive) heart failure: Secondary | ICD-10-CM | POA: Diagnosis not present

## 2024-03-11 DIAGNOSIS — I081 Rheumatic disorders of both mitral and tricuspid valves: Secondary | ICD-10-CM | POA: Diagnosis not present

## 2024-03-11 DIAGNOSIS — I361 Nonrheumatic tricuspid (valve) insufficiency: Secondary | ICD-10-CM | POA: Diagnosis not present

## 2024-03-11 DIAGNOSIS — I428 Other cardiomyopathies: Secondary | ICD-10-CM | POA: Diagnosis not present

## 2024-03-11 DIAGNOSIS — I340 Nonrheumatic mitral (valve) insufficiency: Secondary | ICD-10-CM | POA: Diagnosis not present

## 2024-03-11 DIAGNOSIS — E1122 Type 2 diabetes mellitus with diabetic chronic kidney disease: Secondary | ICD-10-CM | POA: Diagnosis not present

## 2024-03-12 DIAGNOSIS — D689 Coagulation defect, unspecified: Secondary | ICD-10-CM | POA: Diagnosis not present

## 2024-03-12 DIAGNOSIS — N186 End stage renal disease: Secondary | ICD-10-CM | POA: Diagnosis not present

## 2024-03-12 DIAGNOSIS — Z992 Dependence on renal dialysis: Secondary | ICD-10-CM | POA: Diagnosis not present

## 2024-03-12 DIAGNOSIS — N2581 Secondary hyperparathyroidism of renal origin: Secondary | ICD-10-CM | POA: Diagnosis not present

## 2024-03-13 ENCOUNTER — Encounter: Admitting: Student

## 2024-03-14 DIAGNOSIS — D689 Coagulation defect, unspecified: Secondary | ICD-10-CM | POA: Diagnosis not present

## 2024-03-14 DIAGNOSIS — N186 End stage renal disease: Secondary | ICD-10-CM | POA: Diagnosis not present

## 2024-03-14 DIAGNOSIS — Z992 Dependence on renal dialysis: Secondary | ICD-10-CM | POA: Diagnosis not present

## 2024-03-14 DIAGNOSIS — E1129 Type 2 diabetes mellitus with other diabetic kidney complication: Secondary | ICD-10-CM | POA: Diagnosis not present

## 2024-03-14 DIAGNOSIS — N2581 Secondary hyperparathyroidism of renal origin: Secondary | ICD-10-CM | POA: Diagnosis not present

## 2024-03-15 ENCOUNTER — Other Ambulatory Visit: Payer: Self-pay | Admitting: Internal Medicine

## 2024-03-15 NOTE — Telephone Encounter (Signed)
 Copied from CRM [id number]. Topic: Clinical - Medication Refill >> Mar 15, 2024  9:32 AM Miquel SAILOR wrote: Medication: gabapentin  (NEURONTIN ) 300 MG capsule-After refill request for 90 day    Has the patient contacted their pharmacy? Yes (Agent: If no, request that the patient contact the pharmacy for the refill. If patient does not wish to contact the pharmacy document the reason why and proceed with request.) (Agent: If yes, when and what did the pharmacy advise?)  This is the patient's preferred pharmacy:   [id number] [place], [place] - [address] AT [facility] OF [address] [place] [address] Phone: [phone] Fax: [phone]  Is this the correct pharmacy for this prescription? Yes If no, delete pharmacy and type the correct one.   Has the prescription been filled recently? Yes  Is the patient out of the medication? Yes  Has the patient been seen for an appointment in the last year OR does the patient have an upcoming appointment? Yes  Can we respond through MyChart? Yes  Agent: Please be advised that Rx refills may take up to 3 business days. We ask that you follow-up with your pharmacy.

## 2024-03-15 NOTE — Telephone Encounter (Signed)
 Have addressed this multiple times, I am not the prescriber of this medication, should go to dr wert

## 2024-03-16 DIAGNOSIS — N2581 Secondary hyperparathyroidism of renal origin: Secondary | ICD-10-CM | POA: Diagnosis not present

## 2024-03-16 DIAGNOSIS — N186 End stage renal disease: Secondary | ICD-10-CM | POA: Diagnosis not present

## 2024-03-16 DIAGNOSIS — D509 Iron deficiency anemia, unspecified: Secondary | ICD-10-CM | POA: Diagnosis not present

## 2024-03-16 DIAGNOSIS — T7849XA Other allergy, initial encounter: Secondary | ICD-10-CM | POA: Diagnosis not present

## 2024-03-16 DIAGNOSIS — Z992 Dependence on renal dialysis: Secondary | ICD-10-CM | POA: Diagnosis not present

## 2024-03-16 DIAGNOSIS — D689 Coagulation defect, unspecified: Secondary | ICD-10-CM | POA: Diagnosis not present

## 2024-03-18 ENCOUNTER — Telehealth: Payer: Self-pay | Admitting: Internal Medicine

## 2024-03-18 ENCOUNTER — Ambulatory Visit (INDEPENDENT_AMBULATORY_CARE_PROVIDER_SITE_OTHER): Admitting: Internal Medicine

## 2024-03-18 ENCOUNTER — Encounter: Payer: Self-pay | Admitting: Internal Medicine

## 2024-03-18 VITALS — BP 148/81 | HR 78 | Temp 97.5°F | Ht 69.0 in | Wt 208.8 lb

## 2024-03-18 DIAGNOSIS — N2581 Secondary hyperparathyroidism of renal origin: Secondary | ICD-10-CM | POA: Diagnosis not present

## 2024-03-18 DIAGNOSIS — R0609 Other forms of dyspnea: Secondary | ICD-10-CM

## 2024-03-18 DIAGNOSIS — R058 Other specified cough: Secondary | ICD-10-CM

## 2024-03-18 DIAGNOSIS — E1122 Type 2 diabetes mellitus with diabetic chronic kidney disease: Secondary | ICD-10-CM | POA: Diagnosis not present

## 2024-03-18 DIAGNOSIS — I5042 Chronic combined systolic (congestive) and diastolic (congestive) heart failure: Secondary | ICD-10-CM | POA: Diagnosis not present

## 2024-03-18 DIAGNOSIS — Z992 Dependence on renal dialysis: Secondary | ICD-10-CM

## 2024-03-18 DIAGNOSIS — I739 Peripheral vascular disease, unspecified: Secondary | ICD-10-CM

## 2024-03-18 DIAGNOSIS — N186 End stage renal disease: Secondary | ICD-10-CM

## 2024-03-18 DIAGNOSIS — I6523 Occlusion and stenosis of bilateral carotid arteries: Secondary | ICD-10-CM | POA: Diagnosis not present

## 2024-03-18 NOTE — Patient Instructions (Signed)
 Refills of you gabapentin  should come from Dr Darlean, give his office a call at [phone].

## 2024-03-18 NOTE — Progress Notes (Signed)
 [facility] dialysis center called [phone]) for recent A1C report. Stated they will fax result to our office.

## 2024-03-18 NOTE — Progress Notes (Signed)
 Established Patient Office Visit  Subjective   Patient ID: Charles Hart, male    DOB: [birth year]  Age: 68 y.o. MRN: [id number]  Chief Complaint  Patient presents with   Medical Management of Chronic Issues    C/O shortness of breath x 2 weeks.   Discussed the use of AI scribe software for clinical note transcription with the patient, who gave verbal consent to proceed.  History of Present Illness Charles Hart is a 68 year old male with a history of dialysis who presents with shortness of breath.  He has been experiencing shortness of breath for a little over a week, describing it as feeling 'a little winded'.  He visited his cardiologist last Monday in [place] to address this issue. During dialysis, his legs are checked for fluid retention, but he was told that fluid might be accumulating around his abdomen instead.  Despite undergoing dialysis for four and a half hours per session with fluid removal ranging from 1.4 to 1.8 liters, he notes that even with 6-7 liters of fluid, his breathing can be fine at times.  He is currently taking gabapentin , previously prescribed at a dose of 300 mg twice a day. He has not contacted his prescribing doctor for a refill due to uncertainty about which office to call.      Objective:     BP (!) 148/81 (BP Location: Right Arm, Patient Position: Sitting, Cuff Size: Normal)   Pulse 78   Temp (!) 97.5 F (36.4 C) (Oral)   Ht 5' 9 (1.753 m)   Wt 208 lb 12.8 oz (94.7 kg)   SpO2 100% Comment: RA  BMI 30.83 kg/m   Physical Exam CHEST: Clear to auscultation bilaterally. No wheezes, rhonchi, or crackles. Cardiac: RRR LE: trace bilateral edema Abdomen: obese    No results found for any visits on 03/18/24.  Last CBC Lab Results  Component Value Date   WBC 4.2 05/22/2022   HGB 13.9 11/22/2023   HCT 41.0 11/22/2023   MCV 94.4 05/22/2022   MCH 30.1 05/22/2022   RDW 14.2 05/22/2022   PLT 123 (L) 05/22/2022   Last metabolic  panel Lab Results  Component Value Date   GLUCOSE 87 11/22/2023   NA 139 11/22/2023   K 4.6 11/22/2023   CL 101 11/22/2023   CO2 24 05/22/2022   BUN 47 (H) 11/22/2023   CREATININE 10.60 (H) 11/22/2023   GFRNONAA 8 (L) 05/22/2022   CALCIUM  9.0 05/22/2022   PHOS 3.1 05/20/2021   PROT 8.0 11/29/2021   ALBUMIN  4.0 11/29/2021   LABGLOB 3.0 11/18/2014   AGRATIO 0.8 11/18/2014   BILITOT 1.2 11/29/2021   ALKPHOS 78 11/29/2021   AST 19 11/29/2021   ALT 18 11/29/2021   ANIONGAP 11 05/22/2022   Last lipids Lab Results  Component Value Date   CHOL 129 05/26/2022   HDL 51 05/26/2022   LDLCALC 66 05/26/2022   LDLDIRECT 80 07/18/2012   TRIG 53 05/26/2022   CHOLHDL 2.5 05/26/2022   Last hemoglobin A1c Lab Results  Component Value Date   HGBA1C 4.5 02/28/2022      The ASCVD Risk score (Arnett DK, et al., 2019) failed to calculate for the following reasons:   Risk score cannot be calculated because patient has a medical history suggesting prior/existing ASCVD    Assessment & Plan:   Problem List Items Addressed This Visit       Cardiovascular and Mediastinum   Chronic combined systolic and diastolic congestive heart  failure (HCC) (Chronic)   I suspect his SOB is due to hypervolemia from ESRD/ CHF. Likely need to be more aggressive with fluid restriction and removal however he appears pretty stubborn about this.  I have liked his worsening dyspnea symptoms to the need to take off more at HD.      Mild Bilateral Lower Extremity Peripheral arterial disease (HCC) (Chronic)   Due for repeat ABIs      Relevant Orders   VAS US  ABI WITH/WO TBI   Bilateral carotid artery disease (HCC)   Overdue for repeat Carotid dopplers last 2021 plan for 2 year repeat      Relevant Orders   VAS US  CAROTID     Endocrine   Controlled type 2 diabetes mellitus with chronic kidney disease (HCC) - Primary   Essentially diet controlled. Pt reports recnet A1c in July of 4.6, will get records from  HD center to confrim.      Secondary hyperparathyroidism of renal origin (HCC)   Remains on phoslo , managed by [facility].        Genitourinary   ESRD on hemodialysis (HCC)     Other   Upper airway cough syndrome (Chronic)   He remains without a cough at this time.  I as noted above I suspect he is remaining a chronically above EDW.  I discussed that I would like him to continue to follow with Dr Darlean for his cough as such he should continue to obtain refills from their office.      DOE (dyspnea on exertion)      Return in about 6 months (around 09/18/2024).    [place] JAYSON [place], DO

## 2024-03-18 NOTE — Telephone Encounter (Unsigned)
 Copied from CRM [id number]. Topic: Clinical - Medication Refill >> Mar 18, 2024 10:46 AM Isabell A wrote: Medication: gabapentin  (NEURONTIN ) 300 MG capsule  Has the patient contacted their pharmacy? Yes (Agent: If no, request that the patient contact the pharmacy for the refill. If patient does not wish to contact the pharmacy document the reason why and proceed with request.) (Agent: If yes, when and what did the pharmacy advise?)  This is the patient's preferred pharmacy:  [id number] [place], [place] - [address] AT [facility] [address] [facility] [address] & [facility] [address] [place] [address] Phone: [phone] Fax: [phone]   Is this the correct pharmacy for this prescription? Yes If no, delete pharmacy and type the correct one.   Has the prescription been filled recently? Yes  Is the patient out of the medication? Yes  Has the patient been seen for an appointment in the last year OR does the patient have an upcoming appointment? Yes  Can we respond through MyChart? No  Agent: Please be advised that Rx refills may take up to 3 business days. We ask that you follow-up with your pharmacy.

## 2024-03-19 ENCOUNTER — Encounter: Payer: Self-pay | Admitting: Internal Medicine

## 2024-03-19 DIAGNOSIS — N186 End stage renal disease: Secondary | ICD-10-CM | POA: Diagnosis not present

## 2024-03-19 DIAGNOSIS — D509 Iron deficiency anemia, unspecified: Secondary | ICD-10-CM | POA: Diagnosis not present

## 2024-03-19 DIAGNOSIS — D689 Coagulation defect, unspecified: Secondary | ICD-10-CM | POA: Diagnosis not present

## 2024-03-19 DIAGNOSIS — Z992 Dependence on renal dialysis: Secondary | ICD-10-CM | POA: Diagnosis not present

## 2024-03-19 DIAGNOSIS — N2581 Secondary hyperparathyroidism of renal origin: Secondary | ICD-10-CM | POA: Diagnosis not present

## 2024-03-19 MED ORDER — GABAPENTIN 300 MG PO CAPS: 300.0000 mg | ORAL_CAPSULE | Freq: Two times a day (BID) | ORAL | 2 refills | Status: DC

## 2024-03-19 NOTE — Assessment & Plan Note (Signed)
 Essentially diet controlled. Pt reports recnet A1c in July of 4.6, will get records from HD center to confrim.

## 2024-03-19 NOTE — Assessment & Plan Note (Signed)
 Remains on phoslo , managed by [facility].

## 2024-03-19 NOTE — Telephone Encounter (Signed)
 Patient requesting refill for Gabapentin  300mg  sent into patient's pharmacy.Dr.Wert can you please advise .

## 2024-03-19 NOTE — Assessment & Plan Note (Signed)
 Overdue for repeat Carotid dopplers last 2021 plan for 2 year repeat

## 2024-03-19 NOTE — Assessment & Plan Note (Signed)
 I suspect his SOB is due to hypervolemia from ESRD/ CHF. Likely need to be more aggressive with fluid restriction and removal however he appears pretty stubborn about this.  I have liked his worsening dyspnea symptoms to the need to take off more at HD.

## 2024-03-19 NOTE — Assessment & Plan Note (Signed)
 He remains without a cough at this time.  I as noted above I suspect he is remaining a chronically above EDW.  I discussed that I would like him to continue to follow with Dr Darlean for his cough as such he should continue to obtain refills from their office.

## 2024-03-19 NOTE — Telephone Encounter (Signed)
 Spoke with patient regarding prior message .Advised patient per Dr.Wert patient will need to make a 3 month f/u patient is scheduled for 07/08/2024. Script has been sent into patient's pharmacy . Patient's voice was understanding.Nothing else further needed.

## 2024-03-19 NOTE — Assessment & Plan Note (Signed)
 Due for repeat ABIs

## 2024-03-21 ENCOUNTER — Other Ambulatory Visit: Payer: Self-pay | Admitting: Internal Medicine

## 2024-03-22 ENCOUNTER — Other Ambulatory Visit: Payer: Self-pay

## 2024-03-22 ENCOUNTER — Telehealth: Payer: Self-pay | Admitting: Internal Medicine

## 2024-03-22 ENCOUNTER — Other Ambulatory Visit: Payer: Self-pay | Admitting: *Deleted

## 2024-03-22 MED ORDER — PRAVASTATIN SODIUM 40 MG PO TABS: 40.0000 mg | ORAL_TABLET | Freq: Every evening | ORAL | 1 refills | Status: DC

## 2024-03-22 MED ORDER — FUROSEMIDE 80 MG PO TABS: 80.0000 mg | ORAL_TABLET | Freq: Every day | ORAL | 0 refills | Status: DC

## 2024-03-22 MED ORDER — STIOLTO RESPIMAT 2.5-2.5 MCG/ACT IN AERS: INHALATION_SPRAY | RESPIRATORY_TRACT | 11 refills | Status: DC

## 2024-03-22 NOTE — Telephone Encounter (Signed)
 Refills were sent to [facility] for the Lasix  and the Pravachol .

## 2024-03-22 NOTE — Telephone Encounter (Signed)
 Copied from CRM [id number]. Topic: Clinical - Medication Refill >> Mar 22, 2024  9:31 AM Graeme ORN wrote: Medication: Mail order - compliance packaging  furosemide  (LASIX ) 80 MG tablet pravastatin  (PRAVACHOL ) 40 MG tablet   Has the patient contacted their pharmacy? Yes pharmacy requested (Agent: If no, request that the patient contact the pharmacy for the refill. If patient does not wish to contact the pharmacy document the reason why and proceed with request.) (Agent: If yes, when and what did the pharmacy advise?)  This is the patient's preferred pharmacy:  [facility], [place] - [address] [place] [address] Phone: [phone] Fax: [phone]  Is this the correct pharmacy for this prescription? Yes If no, delete pharmacy and type the correct one.   Has the prescription been filled recently? No  Is the patient out of the medication? No  Has the patient been seen for an appointment in the last year OR does the patient have an upcoming appointment? Yes  Can we respond through MyChart? No  Agent: Please be advised that Rx refills may take up to 3 business days. We ask that you follow-up with your pharmacy.

## 2024-03-23 DIAGNOSIS — N186 End stage renal disease: Secondary | ICD-10-CM | POA: Diagnosis not present

## 2024-03-23 DIAGNOSIS — Z992 Dependence on renal dialysis: Secondary | ICD-10-CM | POA: Diagnosis not present

## 2024-03-23 DIAGNOSIS — D509 Iron deficiency anemia, unspecified: Secondary | ICD-10-CM | POA: Diagnosis not present

## 2024-03-23 DIAGNOSIS — T7849XA Other allergy, initial encounter: Secondary | ICD-10-CM | POA: Diagnosis not present

## 2024-03-23 DIAGNOSIS — D689 Coagulation defect, unspecified: Secondary | ICD-10-CM | POA: Diagnosis not present

## 2024-03-23 DIAGNOSIS — N2581 Secondary hyperparathyroidism of renal origin: Secondary | ICD-10-CM | POA: Diagnosis not present

## 2024-03-26 DIAGNOSIS — T7849XA Other allergy, initial encounter: Secondary | ICD-10-CM | POA: Diagnosis not present

## 2024-03-26 DIAGNOSIS — N186 End stage renal disease: Secondary | ICD-10-CM | POA: Diagnosis not present

## 2024-03-26 DIAGNOSIS — N2581 Secondary hyperparathyroidism of renal origin: Secondary | ICD-10-CM | POA: Diagnosis not present

## 2024-03-26 DIAGNOSIS — D509 Iron deficiency anemia, unspecified: Secondary | ICD-10-CM | POA: Diagnosis not present

## 2024-03-26 DIAGNOSIS — Z992 Dependence on renal dialysis: Secondary | ICD-10-CM | POA: Diagnosis not present

## 2024-03-28 DIAGNOSIS — D509 Iron deficiency anemia, unspecified: Secondary | ICD-10-CM | POA: Diagnosis not present

## 2024-03-28 DIAGNOSIS — Z992 Dependence on renal dialysis: Secondary | ICD-10-CM | POA: Diagnosis not present

## 2024-03-28 DIAGNOSIS — N186 End stage renal disease: Secondary | ICD-10-CM | POA: Diagnosis not present

## 2024-03-28 DIAGNOSIS — N2581 Secondary hyperparathyroidism of renal origin: Secondary | ICD-10-CM | POA: Diagnosis not present

## 2024-03-28 DIAGNOSIS — T7849XA Other allergy, initial encounter: Secondary | ICD-10-CM | POA: Diagnosis not present

## 2024-03-29 ENCOUNTER — Ambulatory Visit (HOSPITAL_COMMUNITY)

## 2024-03-29 ENCOUNTER — Ambulatory Visit (HOSPITAL_COMMUNITY): Attending: Internal Medicine

## 2024-03-31 ENCOUNTER — Other Ambulatory Visit: Payer: Self-pay | Admitting: Internal Medicine

## 2024-04-02 DIAGNOSIS — Z992 Dependence on renal dialysis: Secondary | ICD-10-CM | POA: Diagnosis not present

## 2024-04-04 DIAGNOSIS — D689 Coagulation defect, unspecified: Secondary | ICD-10-CM | POA: Diagnosis not present

## 2024-04-05 ENCOUNTER — Ambulatory Visit (HOSPITAL_BASED_OUTPATIENT_CLINIC_OR_DEPARTMENT_OTHER)
Admission: RE | Admit: 2024-04-05 | Discharge: 2024-04-05 | Disposition: A | Discharge status: Home / Self Care | Source: Ambulatory Visit | Attending: Internal Medicine | Admitting: Internal Medicine

## 2024-04-05 ENCOUNTER — Ambulatory Visit (HOSPITAL_COMMUNITY)
Admission: RE | Admit: 2024-04-05 | Discharge: 2024-04-05 | Disposition: A | Discharge status: Home / Self Care | Source: Ambulatory Visit | Attending: Internal Medicine | Admitting: Internal Medicine

## 2024-04-05 DIAGNOSIS — I739 Peripheral vascular disease, unspecified: Secondary | ICD-10-CM | POA: Insufficient documentation

## 2024-04-05 DIAGNOSIS — I6523 Occlusion and stenosis of bilateral carotid arteries: Secondary | ICD-10-CM | POA: Diagnosis not present

## 2024-04-06 LAB — VAS US ABI WITH/WO TBI
Left ABI: 1.01
Right ABI: 0.83

## 2024-04-14 DIAGNOSIS — N186 End stage renal disease: Secondary | ICD-10-CM | POA: Diagnosis not present

## 2024-04-14 DIAGNOSIS — Z992 Dependence on renal dialysis: Secondary | ICD-10-CM | POA: Diagnosis not present

## 2024-04-14 DIAGNOSIS — E1129 Type 2 diabetes mellitus with other diabetic kidney complication: Secondary | ICD-10-CM | POA: Diagnosis not present

## 2024-04-16 DIAGNOSIS — Z992 Dependence on renal dialysis: Secondary | ICD-10-CM | POA: Diagnosis not present

## 2024-04-16 DIAGNOSIS — N2581 Secondary hyperparathyroidism of renal origin: Secondary | ICD-10-CM | POA: Diagnosis not present

## 2024-04-16 DIAGNOSIS — D509 Iron deficiency anemia, unspecified: Secondary | ICD-10-CM | POA: Diagnosis not present

## 2024-04-16 DIAGNOSIS — D689 Coagulation defect, unspecified: Secondary | ICD-10-CM | POA: Diagnosis not present

## 2024-04-18 DIAGNOSIS — N2581 Secondary hyperparathyroidism of renal origin: Secondary | ICD-10-CM | POA: Diagnosis not present

## 2024-04-18 DIAGNOSIS — Z992 Dependence on renal dialysis: Secondary | ICD-10-CM | POA: Diagnosis not present

## 2024-04-18 DIAGNOSIS — D509 Iron deficiency anemia, unspecified: Secondary | ICD-10-CM | POA: Diagnosis not present

## 2024-04-18 DIAGNOSIS — N186 End stage renal disease: Secondary | ICD-10-CM | POA: Diagnosis not present

## 2024-04-20 DIAGNOSIS — D509 Iron deficiency anemia, unspecified: Secondary | ICD-10-CM | POA: Diagnosis not present

## 2024-04-20 DIAGNOSIS — N186 End stage renal disease: Secondary | ICD-10-CM | POA: Diagnosis not present

## 2024-04-20 DIAGNOSIS — D689 Coagulation defect, unspecified: Secondary | ICD-10-CM | POA: Diagnosis not present

## 2024-04-20 DIAGNOSIS — Z992 Dependence on renal dialysis: Secondary | ICD-10-CM | POA: Diagnosis not present

## 2024-04-20 DIAGNOSIS — N2581 Secondary hyperparathyroidism of renal origin: Secondary | ICD-10-CM | POA: Diagnosis not present

## 2024-04-25 DIAGNOSIS — Z992 Dependence on renal dialysis: Secondary | ICD-10-CM | POA: Diagnosis not present

## 2024-04-25 DIAGNOSIS — N186 End stage renal disease: Secondary | ICD-10-CM | POA: Diagnosis not present

## 2024-04-25 DIAGNOSIS — D689 Coagulation defect, unspecified: Secondary | ICD-10-CM | POA: Diagnosis not present

## 2024-04-25 DIAGNOSIS — D509 Iron deficiency anemia, unspecified: Secondary | ICD-10-CM | POA: Diagnosis not present

## 2024-04-25 DIAGNOSIS — N2581 Secondary hyperparathyroidism of renal origin: Secondary | ICD-10-CM | POA: Diagnosis not present

## 2024-04-30 DIAGNOSIS — D509 Iron deficiency anemia, unspecified: Secondary | ICD-10-CM | POA: Diagnosis not present

## 2024-05-02 ENCOUNTER — Telehealth: Payer: Self-pay | Admitting: Internal Medicine

## 2024-05-02 NOTE — Telephone Encounter (Signed)
 Rtn pt's call to office an appointment for a possible ENT Referral.  Copied from CRM [id number]. Topic: Referral - Request for Referral >> May 01, 2024  9:19 AM Merlynn A wrote: Did the patient discuss referral with their provider in the last year? No (If No - schedule appointment) (If Yes - send message)  Appointment offered? Yes - Patient declined appointment. Advised patient that appointment may be required to discuss referral. Patient understood.   Type of order/referral and detailed reason for visit: Ear Doctor  Preference of office, provider, location: No Preference  If referral order, have you been seen by this specialty before? Yes (If Yes, this issue or another issue? When? Where? - Patient was seeing Ear Doctor in [place].   Can we respond through MyChart? No

## 2024-05-04 DIAGNOSIS — N186 End stage renal disease: Secondary | ICD-10-CM | POA: Diagnosis not present

## 2024-05-04 DIAGNOSIS — N2581 Secondary hyperparathyroidism of renal origin: Secondary | ICD-10-CM | POA: Diagnosis not present

## 2024-05-04 DIAGNOSIS — Z992 Dependence on renal dialysis: Secondary | ICD-10-CM | POA: Diagnosis not present

## 2024-05-04 DIAGNOSIS — D689 Coagulation defect, unspecified: Secondary | ICD-10-CM | POA: Diagnosis not present

## 2024-05-04 DIAGNOSIS — D509 Iron deficiency anemia, unspecified: Secondary | ICD-10-CM | POA: Diagnosis not present

## 2024-05-07 DIAGNOSIS — N2581 Secondary hyperparathyroidism of renal origin: Secondary | ICD-10-CM | POA: Diagnosis not present

## 2024-05-07 DIAGNOSIS — N186 End stage renal disease: Secondary | ICD-10-CM | POA: Diagnosis not present

## 2024-05-07 DIAGNOSIS — Z992 Dependence on renal dialysis: Secondary | ICD-10-CM | POA: Diagnosis not present

## 2024-05-07 DIAGNOSIS — D689 Coagulation defect, unspecified: Secondary | ICD-10-CM | POA: Diagnosis not present

## 2024-05-09 DIAGNOSIS — N186 End stage renal disease: Secondary | ICD-10-CM | POA: Diagnosis not present

## 2024-05-13 ENCOUNTER — Other Ambulatory Visit: Payer: Self-pay | Admitting: Internal Medicine

## 2024-05-14 DIAGNOSIS — N2581 Secondary hyperparathyroidism of renal origin: Secondary | ICD-10-CM | POA: Diagnosis not present

## 2024-05-14 DIAGNOSIS — D689 Coagulation defect, unspecified: Secondary | ICD-10-CM | POA: Diagnosis not present

## 2024-05-14 DIAGNOSIS — E1129 Type 2 diabetes mellitus with other diabetic kidney complication: Secondary | ICD-10-CM | POA: Diagnosis not present

## 2024-05-14 DIAGNOSIS — D509 Iron deficiency anemia, unspecified: Secondary | ICD-10-CM | POA: Diagnosis not present

## 2024-05-14 DIAGNOSIS — Z992 Dependence on renal dialysis: Secondary | ICD-10-CM | POA: Diagnosis not present

## 2024-05-14 DIAGNOSIS — N186 End stage renal disease: Secondary | ICD-10-CM | POA: Diagnosis not present

## 2024-05-15 ENCOUNTER — Ambulatory Visit: Payer: Self-pay | Admitting: Student

## 2024-05-15 NOTE — Progress Notes (Deleted)
 CC: ***  HPI: Charles Hart is a 68 y.o. male living with a history stated below and presents today for ***. Please see problem based assessment and plan for additional details.  Past Medical History:  Diagnosis Date   Adenomatous colon polyp 07/02/2011   Last colonoscopy May 06, 2011 by Dr. Toribio Cedar, who recommended repeat colonoscopy in 5 years.    Anemia    Background diabetic retinopathy 04/20/2012   Patient is followed by Dr. Octavia    Cancer [facility])    Kidney   Cardiomyopathy    LV function improved from 2004 to 2008.  Historically, moderately dilated LV with EF 30-40% by 2D echo 08/14/2002.  Mild CAD with severe LV dysfunction by cardiac cath 09/2002.  Normal coronary arteries and normal LV function by cardiac cath 09/19/2006.  A 2-D echo on 04/01/2009 showed mild concentric hypertrophy and normal systolic (LVEF  60-65%) and doppler C/W with grade 1 diastolic dysfunction.   Cerebral infarction, unspecified (HCC) 01/19/2018   CHF (congestive heart failure) (HCC)    LV function improved from 2004 to 2008.  Historically, moderately dilated LV with EF 30-40% by 2D echo 08/14/2002.  Mild CAD with severe LV dysfunction by cardiac cath 09/2002.  Normal coronary arteries and normal LV function by cardiac cath 09/19/2006.  A 2-D echo on 04/01/2009 showed mild concentric hypertrophy and normal systolic (LVEF  60-65%) and doppler C/W with grade 1 diastolic dysfunction..    Chronic combined systolic and diastolic congestive heart failure (HCC) 05/21/2010   LV function improved from 2004 to 2008.  Historically, moderately dilated LV with EF 30-40% by 2D echo 08/14/2002.  Mild CAD with severe LV dysfunction by cardiac cath 09/2002.  Normal coronary arteries and normal LV function by cardiac cath 09/19/2006.  A 2-D echo on 04/01/2009 showed mild concentric hypertrophy and normal systolic (LVEF  60-65%) and doppler parameters consistent with abnormal left    CVA (cerebral vascular accident)  (HCC) 07/04/2012   MRI of the brain 07/04/2012 showed an acute infarct in the right basal ganglia involving the anterior putamen, anterior limb internal capsule, and head of the caudate; this measured approximately 2.5 cm in diameter.      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   Qualifier: Diagnosis of  By: Lanis MD, Christopher     DM neuropathy, painful (HCC)    fingers and right knee   ESRD (end stage renal disease) (HCC)    Stage 4, on hemodailysis x 4 months as of 05-18-18 [place] [facility], tues thurs sat   ESRD (end stage renal disease) on dialysis (HCC)    TTS; [facility] (05/31/2018)   Hearing loss in right ear    Hyperlipidemia    Hypertension    Hypertensive crisis 07/28/2012   Hypertensive urgency 08/20/2014   Myocardial infarction (HCC)  many yrs ago   Nephrotic syndrome 02/18/2013   A 24-hour urine collection 03/04/2013 showed total protein of 5,460 g and creatinine clearance of 80 mL/minute.  Patient was seen by Zelma Gallon at [facility] and a repeat 24-hour urine showed 10,407 mg protein.  Patient underwent kidney biopsy on 05/30/2013; pathology showed advanced diffuse and nodular diabetic nephropathy with vascular changes consistent with long-standing difficult to control hypertension.      Pneumonia 2014 and 2015   Renal insufficiency    Sleep apnea  11/06/20 - hasn't used it in a while   ST elevation (STEMI) myocardial infarction of unspecified site [facility]) 01/19/2018   Surgical pneumoperitoneum    Trochanteric bursitis of right hip 04/25/2018    Current Outpatient Medications on File Prior to Visit  Medication Sig Dispense Refill   acetaminophen  (TYLENOL ) 325 MG tablet Take 2 tablets (650 mg total) by mouth every 6 (six) hours as needed for mild pain.     calcium  acetate (PHOSLO ) 667 MG capsule Take 2,668 mg by mouth 3 (three) times daily with  meals.     carvedilol  (COREG ) 6.25 MG tablet Take 6.25 mg by mouth 2 (two) times daily.     fluticasone  (FLONASE ) 50 MCG/ACT nasal spray Place 2 sprays into both nostrils as needed for allergies. 16 g 1   folic acid -vitamin b complex-vitamin c-selenium-zinc (DIALYVITE ) 3 MG TABS tablet Take 1 tablet by mouth daily.     furosemide  (LASIX ) 80 MG tablet Take 1 tablet (80 mg total) by mouth daily. 90 tablet 0   gabapentin  (NEURONTIN ) 300 MG capsule Take 1 capsule (300 mg total) by mouth 2 (two) times daily. 60 capsule 2   hydrALAZINE  (APRESOLINE ) 100 MG tablet Take 1 tablet (100 mg total) by mouth 3 (three) times daily. 90 tablet 2   ketorolac (ACULAR) 0.5 % ophthalmic solution Place 1 drop into the left eye 3 (three) times daily as needed (help see better).     losartan  (COZAAR ) 25 MG tablet Take 25 mg by mouth daily.     oxyCODONE  (ROXICODONE ) 5 MG immediate release tablet Take 1 tablet (5 mg total) by mouth every 6 (six) hours as needed. (Patient not taking: Reported on 12/19/2023) 20 tablet 0   pantoprazole  (PROTONIX ) 40 MG tablet One twice daily (take after dialysis) 30 tablet 0   pravastatin  (PRAVACHOL ) 40 MG tablet Take 1 tablet (40 mg total) by mouth every evening. 90 tablet 1   Tiotropium Bromide-Olodaterol (STIOLTO RESPIMAT ) 2.5-2.5 MCG/ACT AERS INHALE 2 PUFFS BY MOUTH EVERY MORNING 4 g 11   No current facility-administered medications on file prior to visit.    Family History  Problem Relation Age of Onset   Aneurysm Father 64       died of rupture   Colon cancer Sister     Social History   Socioeconomic History   Marital status: Divorced    Spouse name: Not on file   Number of children: Not on file   Years of education: Not on file   Highest education level: Not on file  Occupational History   Not on file  Tobacco Use   Smoking status: Never   Smokeless tobacco: Never  Vaping Use   Vaping status: Never Used  Substance and Sexual Activity   Alcohol  use: Not Currently    Drug use: No   Sexual activity: Not on file  Other Topics Concern   Not on file  Social History Narrative   Not on file   Social Drivers of Health   Financial Resource Strain: Medium Risk (03/18/2024)   Overall Financial Resource Strain (CARDIA)    Difficulty of Paying Living Expenses: Somewhat hard  Food Insecurity: No Food Insecurity (03/18/2024)   Hunger Vital Sign    Worried About Running Out of Food in the Last Year: Never true    Ran Out of Food in the Last Year: Never true  Transportation Needs: No Transportation Needs (03/18/2024)   PRAPARE - Administrator, Civil Service (Medical): No  Lack of Transportation (Non-Medical): No  Physical Activity: Inactive (11/24/2022)   Exercise Vital Sign    Days of Exercise per Week: 0 days    Minutes of Exercise per Session: 0 min  Stress: No Stress Concern Present (11/24/2022)   [employer] of Occupational Health - Occupational Stress Questionnaire    Feeling of Stress : Not at all  Social Connections: Moderately Isolated (11/24/2022)   Social Connection and Isolation Panel    Frequency of Communication with Friends and Family: More than three times a week    Frequency of Social Gatherings with Friends and Family: More than three times a week    Attends Religious Services: More than 4 times per year    Active Member of [employer] or Organizations: No    Attends Banker Meetings: Never    Marital Status: Divorced  Catering manager Violence: Not At Risk (03/18/2024)   Humiliation, Afraid, Rape, and Kick questionnaire    Fear of Current or Ex-Partner: No    Emotionally Abused: No    Physically Abused: No    Sexually Abused: No    Review of Systems: ROS negative except for what is noted on the assessment and plan.  There were no vitals filed for this visit.  Physical Exam  Physical Exam: Constitutional: well-appearing *** sitting in ***, in no acute distress HENT: normocephalic atraumatic, mucous membranes  moist Eyes: conjunctiva non-erythematous Neck: supple Cardiovascular: regular rate and rhythm, no m/r/g Pulmonary/Chest: normal work of breathing on room air, lungs clear to auscultation bilaterally Abdominal: soft, non-tender, non-distended MSK: *** Neurological: alert & oriented x 3, 5/5 strength in bilateral upper and lower extremities, normal gait Skin: warm and dry Psych: ***  Assessment & Plan:   Assessment & Plan   No orders of the defined types were placed in this encounter.  PMH: PAD, HTN, CHF, OSA, T2DM hearing loss, ESRD on HD  Hx sensorineural neural hearing loss Audiological evaluation 08/07/2007 showed mild to moderate sensorineural hearing loss in the right ear with poor word discrimination, and mild high frequency sensorineural hearing loss in the left ear with excellent word recognition. Dr. Medford Angle (ENT) saw patient on 09/20/2007; MRI of the brain on 10/10/2007 showed mild chronic microvascular ischemia in the cerebral white matter, but no explanation for the patient's hearing loss.   Dr. Angle (ENT) last visit 06/2013 Hearing loss treatment options his hearing aid, had trouble due to cost at that time right ear worse than left ear  *** [employer]   No follow-ups on file.   Patient {[id number]::discussed with,seen with} Dr. {[id number]::Tpoopjfd,Z. Hoffman,Winfrey,Narendra,Chun,Chambliss,Lau,Machen}  Charles Hart, D.O. [facility] Internal Medicine, PGY-3 Phone: [phone] Date 05/15/2024 Time 7:43 AM

## 2024-05-27 LAB — LAB REPORT - SCANNED
A1c: 4.8
Calcium: 9.4
PTH, Intact: 595

## 2024-06-19 ENCOUNTER — Other Ambulatory Visit: Payer: Self-pay | Admitting: Internal Medicine

## 2024-06-19 MED ORDER — FUROSEMIDE 80 MG PO TABS: 80.0000 mg | ORAL_TABLET | Freq: Every day | ORAL | 0 refills | Status: DC

## 2024-06-19 NOTE — Telephone Encounter (Signed)
 Copied from CRM #[id number]. Topic: Clinical - Medication Refill >> Jun 19, 2024  3:16 PM Fredrica W wrote: Medication:  furosemide  (LASIX ) 80 MG tablet  Has the patient contacted their pharmacy? No (Agent: If no, request that the patient contact the pharmacy for the refill. If patient does not wish to contact the pharmacy document the reason why and proceed with request.) (Agent: If yes, when and what did the pharmacy advise?) Pharmacy requested  This is the patient's preferred pharmacy:   [facility] [place]  [place], [place] - [address] [place] [address] Phone: [phone] Fax: [phone]  Is this the correct pharmacy for this prescription? Yes If no, delete pharmacy and type the correct one.   Has the prescription been filled recently? No August  Is the patient out of the medication? No  Has the patient been seen for an appointment in the last year OR does the patient have an upcoming appointment? Yes  Can we respond through MyChart? No  Agent: Please be advised that Rx refills may take up to 3 business days. We ask that you follow-up with your pharmacy.

## 2024-06-25 LAB — OPHTHALMOLOGY REPORT-SCANNED

## 2024-07-01 ENCOUNTER — Encounter (INDEPENDENT_AMBULATORY_CARE_PROVIDER_SITE_OTHER): Admitting: Ophthalmology

## 2024-07-01 DIAGNOSIS — E113512 Type 2 diabetes mellitus with proliferative diabetic retinopathy with macular edema, left eye: Secondary | ICD-10-CM | POA: Diagnosis not present

## 2024-07-01 DIAGNOSIS — E113591 Type 2 diabetes mellitus with proliferative diabetic retinopathy without macular edema, right eye: Secondary | ICD-10-CM

## 2024-07-01 DIAGNOSIS — H43813 Vitreous degeneration, bilateral: Secondary | ICD-10-CM

## 2024-07-01 DIAGNOSIS — H35033 Hypertensive retinopathy, bilateral: Secondary | ICD-10-CM

## 2024-07-01 DIAGNOSIS — I10 Essential (primary) hypertension: Secondary | ICD-10-CM | POA: Diagnosis not present

## 2024-07-01 DIAGNOSIS — Z7984 Long term (current) use of oral hypoglycemic drugs: Secondary | ICD-10-CM | POA: Diagnosis not present

## 2024-07-08 ENCOUNTER — Ambulatory Visit: Admitting: Internal Medicine

## 2024-07-08 ENCOUNTER — Ambulatory Visit

## 2024-07-08 ENCOUNTER — Encounter: Payer: Self-pay | Admitting: Internal Medicine

## 2024-07-08 VITALS — BP 118/76 | HR 73 | Temp 97.7°F | Ht 69.0 in | Wt 209.8 lb

## 2024-07-08 DIAGNOSIS — R058 Other specified cough: Secondary | ICD-10-CM

## 2024-07-08 DIAGNOSIS — R0609 Other forms of dyspnea: Secondary | ICD-10-CM | POA: Diagnosis not present

## 2024-07-08 MED ORDER — STIOLTO RESPIMAT 2.5-2.5 MCG/ACT IN AERS: INHALATION_SPRAY | RESPIRATORY_TRACT | 3 refills | Status: AC

## 2024-07-08 MED ORDER — GABAPENTIN 300 MG PO CAPS: 300.0000 mg | ORAL_CAPSULE | Freq: Two times a day (BID) | ORAL | 3 refills | Status: AC

## 2024-07-08 MED ORDER — FAMOTIDINE 20 MG PO TABS: ORAL_TABLET | ORAL | 3 refills | Status: DC

## 2024-07-08 MED ORDER — PANTOPRAZOLE SODIUM 40 MG PO TBEC: DELAYED_RELEASE_TABLET | ORAL | 3 refills | Status: DC

## 2024-07-08 NOTE — Patient Instructions (Addendum)
 Increase your gabapentin  back to 300 mg twice daily and your cough should improve (take after dialysis)  Stiolto  2 puff first thing each am  (not as needed)   Continue pantaprazole Take 30- 60 min before your first and last meals of the day (or after dialysis on those days) and  pepcid  before bedtime (famotidine )  Please remember to go to the  x-ray department  for your tests - we will call you with the results when they are available    Please schedule a follow up office visit in 8 weeks, call sooner if needed with all medications /inhalers/ solutions in hand so we can verify exactly what you are taking. This includes all medications from all doctors and over the counters    PFTs on return before and after spirometry, no lung volumes or diffusing capacity

## 2024-07-08 NOTE — Progress Notes (Unsigned)
 Charles Hart, male    DOB: 1955/09/06    MRN: [id number]    Brief patient profile:  5 yobm never smoker   referred to pulmonary clinic in [place]  03/26/2021 by Dr   Charles Hart for cough abrupt onset early July 2022 assoc with sob noct but not with adls  HD   tues thurs sat   x 2015    History of Present Illness  03/26/2021  Pulmonary/ 1st Hart eval/ Charles Hart / [place] [facility]  Chief Complaint  Patient presents with   Pulmonary Consult    Referred by Dr. Fairy Charles Hart. Pt c/o non prod cough with SOB x 1 month.    Dyspnea:  MMRC1 = can walk nl pace, flat grade, can't hurry or go uphills or steps s sob   Cough: dry less when asleep  Sleep: sob worse  when lie down - can't tell worse on nights when not HD SABA use: none  Using lots of mints/ menthols due to sense of pnds  Rec Pantoprazole  (protonix ) 40 mg   Take  30-60 min before first meal of the day and Pepcid  (famotidine )  20 mg after supper until return to Hart -  GERD diet reviewed, bed blocks rec  Pay attention to how you do with your breathing when you lie down on Sat night  vs Sun vs Monday Please schedule a follow up Hart visit in 8 weeks, call sooner if needed with all medications /inhalers/ solutions in hand   Date of Admission: 05/17/2021  2:26 PM Date of Discharge: 05/20/2021  7:02 PM   Discharge Diagnosis: 1. Acute exacerbation of heart failure  2. Chronic systolic and diastolic Heart Failure 3. ESRD on Hemodialysis 4. Hypertension 5. Hypokalemia 6. Obstructive Sleep Apnea 7. Normocytic anemia   05/22/2023  f/u ov/Charles Hart/Charles Hart re: cough/ doe  restarted  stiolto x month  / on prn gabapentin  (really not taking it nor gerd rx consistently  Chief Complaint  Patient presents with   Upper airway cough syndrome   Dyspnea:  lowe's walking  x 4 aisles and has to stop due to sob  Cough: some dry no pattern  Sleeping: 30 degrees  waking 2- 3 times per week cough / sob   SABA use: none  02: prn  during HD / no change on Monday nights vs any other  Rec Ok to continue Stiolto 2 puffs each am (like high octane fuel) 0k me to stop it when better  Work on inhaler technique:  Pantoprazole  (protonix ) 40 mg   Take  30-60 min before first meal of the day and Pepcid  (famotidine )  20 mg after supper until return to Hart GERD diet reviewed, bed blocks rec  Increase gabapentin  to 300 mg twice daily until return (take after dialysis) Cxr : CM s infiltrates   - PFT's  07/21/23  FEV1 1.07 (37 % ) ratio 0.61  p 0 % improvement from saba p 0 prior to study with DLCO  15.85 (63%)   and FV curve no significant concavity / sawtooth pattern  and ERV 11% at wt 196     07/24/2023  f/u ov/Charles Hart/Charles Hart re: severe airflow obst   Chief Complaint  Patient presents with   Cough  Dyspnea: limited more by back than breathing - one day prior  to ov could do anything  but back pain comes and goes for years and can't do anything when in pain  Cough: better  Sleeping: no resp cc  SABA use:  none  02: none  Rec If problems wih breathing or cough please return  with all medications /inhalers/ solutions in hand   09/06/2023  f/u ov/Charles Hart/Charles Hart re: severe airflow obst maint off stiolto/ did not bring any meds   Chief Complaint  Patient presents with   Follow-up    Pt states he been doing well and still is using Cpap at night here for PFT results  Dyspnea: better while on stiolto  Cough: resolved  on gabapentin  / documented hs is on 300 mg pills refilled at pharmacy as recently as 09/04/23 and supposed to be taking bid  Sleeping:  hob up 45 degrees s  resp cc  SABA use: none  02: none  Rec Stiolto is 2 puffs each am  - like high octane fuel  If you can't afford it but you really like it, check with you insurance company to see what they will pay for Please schedule a follow up visit in  6  months but call sooner if needed    07/08/2024  f/u ov/Charles Hart re: severe airflow obst ? Partly  artifact on pfts  maint on stiolto  and gabapentin   300 mg  Chief Complaint  Patient presents with   Medical Management of Chronic Issues   Shortness of Breath    Breathing is sometimes worse- usually right before he goes for dialysis. He has been coughing more recently- non prod, sometimes wakes him in the night.   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    Also denies any obvious fluctuation of symptoms with weather or environmental changes or other aggravating or alleviating factors except as outlined above   No unusual exposure hx or h/o childhood pna/ asthma or knowledge of premature birth.  Current Allergies, Complete Past Medical History, Past Surgical History, Family History, and Social History were reviewed in [employer] record.  ROS  The following are not active complaints unless bolded Hoarseness, sore throat, dysphagia, dental problems, itching, sneezing,  nasal congestion or discharge of excess mucus or purulent secretions, ear ache,   fever, chills, sweats, unintended wt loss or wt gain, classically pleuritic or exertional cp,  orthopnea pnd or arm/hand swelling  or leg swelling, presyncope, palpitations, abdominal pain, anorexia, nausea, vomiting, diarrhea  or change in bowel habits or change in bladder habits, change in stools or change in urine, dysuria, hematuria,  rash, arthralgias, visual complaints, headache, numbness, weakness or ataxia or problems with  walking or coordination,  change in mood or  memory.        Current Meds -  - NOTE:   Unable to verify as accurately reflecting what pt takes    Medication Sig   acetaminophen  (TYLENOL ) 325 MG tablet Take 2 tablets (650 mg total) by mouth every 6 (six) hours as needed for mild pain.   calcium  acetate (PHOSLO ) 667 MG capsule Take 2,668 mg by mouth  3 (three) times daily with meals.   carvedilol  (COREG ) 6.25 MG tablet Take 6.25 mg by mouth 2 (two) times daily.   folic acid -vitamin b complex-vitamin c-selenium-zinc (DIALYVITE ) 3 MG TABS tablet Take 1 tablet by mouth daily.   furosemide  (LASIX ) 80 MG tablet Take 1 tablet (80 mg total) by mouth daily.   gabapentin  (NEURONTIN ) 300 MG capsule Take 1 capsule (300 mg total) by mouth 2 (two) times daily.   hydrALAZINE  (APRESOLINE ) 100 MG tablet Take 1 tablet (100 mg total) by mouth 3 (three) times daily.   ketorolac (ACULAR) 0.5 % ophthalmic solution Place 1 drop into the left eye 3 (three) times daily as needed (help see better).   losartan  (COZAAR ) 25 MG tablet Take 25 mg by mouth daily.   pantoprazole  (PROTONIX ) 40 MG tablet One twice daily (take after dialysis)   pravastatin  (PRAVACHOL ) 40 MG tablet Take 1 tablet (40 mg total) by mouth every evening.   Tiotropium Bromide-Olodaterol (STIOLTO RESPIMAT ) 2.5-2.5 MCG/ACT AERS INHALE 2 PUFFS BY MOUTH EVERY MORNING              Past Medical History:  Diagnosis Date   Adenomatous colon polyp 07/02/2011   Last colonoscopy May 06, 2011 by Dr. Toribio Cedar, who recommended repeat colonoscopy in 5 years.    Anemia    Background diabetic retinopathy 04/20/2012   Patient is followed by Dr. Octavia    Cancer [facility])    Kidney   Cardiomyopathy    LV function improved from 2004 to 2008.  Historically, moderately dilated LV with EF 30-40% by 2D echo 08/14/2002.  Mild CAD with severe LV dysfunction by cardiac cath 09/2002.  Normal coronary arteries and normal LV function by cardiac cath 09/19/2006.  A 2-D  echo on 04/01/2009 showed mild concentric hypertrophy and normal systolic (LVEF  60-65%) and doppler C/W with grade 1 diastolic dysfunction.   CHF (congestive heart failure) (HCC)    LV function improved from 2004 to 2008.  Historically, moderately dilated LV with EF 30-40% by 2D echo 08/14/2002.  Mild CAD with severe LV dysfunction by cardiac cath 09/2002.  Normal coronary arteries and normal LV function by cardiac cath 09/19/2006.  A 2-D echo on 04/01/2009 showed mild concentric hypertrophy and normal systolic (LVEF  60-65%) and doppler C/W with grade 1 diastolic dysfunction..    Chronic combined systolic and diastolic congestive heart failure (HCC) 05/21/2010   LV function improved from 2004 to 2008.  Historically, moderately dilated LV with EF 30-40% by 2D echo 08/14/2002.  Mild CAD with severe LV dysfunction by cardiac cath 09/2002.  Normal coronary arteries and normal LV function by cardiac cath 09/19/2006.  A 2-D echo on 04/01/2009 showed mild concentric hypertrophy and normal systolic (LVEF  60-65%) and doppler parameters consistent with abnormal left    CVA (cerebral vascular accident) (HCC) 07/04/2012   MRI of the brain 07/04/2012 showed an acute infarct in the right basal ganglia involving the anterior putamen, anterior limb internal capsule, and head of the caudate; this measured approximately 2.5 cm in diameter.      Dermatitis    Diabetes mellitus    type 2   DIABETIC PERIPHERAL NEUROPATHY 08/03/2007   Qualifier: Diagnosis of  By: Lanis MD, Christopher     DM neuropathy, painful (HCC)    fingers and right knee   ESRD (end stage renal disease) (HCC)    Stage 4, on hemodailysis x 4 months as of 05-18-18 [facility], tues thurs sat   ESRD (end stage  renal disease) on dialysis (HCC)    TTS; [facility] (05/31/2018)   Hearing loss in right ear    Hyperlipidemia    Hypertension    Hypertensive crisis 07/28/2012   Hypertensive urgency 08/20/2014   Myocardial infarction [place])  many yrs ago    Nephrotic syndrome 02/18/2013   A 24-hour urine collection 03/04/2013 showed total protein of 5,460 g and creatinine clearance of 80 mL/minute.  Patient was seen by Zelma Gallon at [facility] and a repeat 24-hour urine showed 10,407 mg protein.  Patient underwent kidney biopsy on 05/30/2013; pathology showed advanced diffuse and nodular diabetic nephropathy with vascular changes consistent with long-standing difficult to control hypertension.      Pneumonia 2014 and 2015   Renal insufficiency    Sleep apnea    11/06/20 - hasn't used it in a while        Objective:    Wts  07/08/2024     209  07/24/2023       202 05/22/2023       193   06/10/2022     190   03/31/2022       176   05/26/21 200 lb 1.9 oz (90.8 kg)  05/20/21 194 lb 7.1 oz (88.2 kg)  05/06/21 205 lb 14.4 oz (93.4 kg)      Vital signs reviewed  07/08/2024  - Note at rest 02 sats  99% on RA   General appearance:    amb mod obese (by BMI) nad easily confused with details of care      HEENT : Oropharynx  clear/ edentulous      Nasal turbinates nl    NECK :  without  apparent JVD/ palpable Nodes/TM    LUNGS: no acc muscle use,  slt kyphotic  contour chest which is clear to A and P bilaterally without cough on insp or exp maneuvers   CV:  RRR  no s3 or murmur or increase in P2, and no edema   ABD:  soft and nontender   MS:  Gait nl   ext warm without deformities Or obvious joint restrictions  calf tenderness, cyanosis or clubbing    SKIN: warm and dry without lesions    NEURO:  alert, approp, nl sensorium with  no motor or cerebellar deficits apparent.    CXR PA and Lateral:   07/08/2024 :    I personally reviewed images and impression is as follows:     Mod cm/ no overt chf/ infiltrates/ mild kyphosis     Assessment       Assessment & Plan DOE (dyspnea on exertion) Onset July 2022  - pt not sure whether better or not berfore or after HD as of 03/26/2021  - 03/26/2021 rec max  rx for gerd, observe whether reported noct sob correlates with HD - 03/26/2021   Walked on RA x  3  lap(s) =  approx 150 @ moderate pace, stopped due to end of study with  min sob  with lowest 02 sats 97 - Echo 05/18/21 EF similar to echo done 08/13/20 . Left ventricular ejection fraction, by estimation, is 30 to 35%. The left ventricle has severely decreased  function. The left ventricle demonstrates global hypokinesis. The left  ventricular internal cavity size was  severely dilated. Left ventricular diastolic parameters were normal.   2. Right ventricular systolic function is mildly reduced. The right  ventricular size is mildly enlarged.   3. Left atrial size was moderately dilated.  4. Right atrial size was moderately dilated.   5. The mitral valve is abnormal. Mild to moderate mitral valve  regurgitation. No evidence of mitral stenosis.   6. The aortic valve is tricuspid. Aortic valve regurgitation is trivial.  Mild aortic valve sclerosis is present, with no evidence of aortic valve  stenosis.  Left Atrium: Left atrial size was moderately dilated. - 05/26/2021   Walked on RA  x  3  lap(s) =  approx 450 ft @ fast pace, stopped due to end of study, min sob last lap with lowest 02 sats 97%   - 03/31/2022    stiolto and pred trial and restart gerd rx  f/u in 4 weeks with all meds in hand  - 06/10/2022  After extensive coaching inhaler device,  effectiveness =    75% with SMI > rechallenge with stiolto x 2 weeks then try off pending /fu pfts > did not do -  05/22/2023   Walked on RA  x  3  lap(s) =  approx 450  ft  @ mod pace, stopped due to end of study with lowest 02 sats 96% with sob but not cp/ cough   - 05/22/2023  After extensive coaching inhaler device,  effectiveness =    75% with respimat from a baseline of near 0 so ok to rechallenge   - PFT's  07/21/23  FEV1 1.07 (37 % ) ratio 0.61  p 0 % improvement from saba p 0 prior to study with DLCO  15.85 (63%)   and FV curve no significant  concavity / sawtooth pattern  and ERV 11 at wt 196    Copd pattern in a never 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  So it would be unsual, but he certainly acts more like copd than asthma  >>> no change in rx needed   >>> continue stiolto 2 each am (this may also help if has component  of cardiac asthma which is mediated via cholinergic nervous system)    Upper airway cough syndrome Onset July 2022 rx with mint/menthol products  - 03/26/2021 rec no mint/menthol and rx with max gerd rx x 6 weeks then ov > did not follow instructions - 03/31/2022 try again same rx but increase gabapentin  to 300 mg bid  and f/u in 4 weeks - 05/22/2023 recurrent off gerd/gabapentin  rx > restart  -  07/21/23  f/v loop with no significant  concavity and unusual sawtooth patter  exp > insp  Add 09/07/2023 :  refilling gabapentin  300 mg ? Taking bid> no change in RX  >>> apparently only taking gabapetin 300 mg q d so rec increase back to bid if tol  >>> continue max gerd rx   >>>  Return with all meds in hand using a trust but verify approach to confirm accurate Medication  Reconciliation The principal here is that until we are certain that the  patients are doing what we've asked, it makes no sense to ask them to do more.   >>> pfts on return    Each maintenance medication was reviewed in detail including emphasizing most importantly the difference between maintenance and prns and under what circumstances the prns are to be triggered using an action plan format where appropriate.  Total time for H and P, chart review, counseling, reviewing [facility]  device(s) and generating customized AVS unique to this Hart visit / same day charting = 34  min          AVS  Patient Instructions  Increase your gabapentin  back to 300 mg twice daily and your cough should improve (take after dialysis)  Stiolto  2 puff first thing each am  (not as needed)    Continue pantaprazole Take 30- 60 min before your first and last meals of the day (or after dialysis on those days) and  pepcid  before bedtime (famotidine )  Please remember to go to the  x-ray department  for your tests - we will call you with the results when they are available    Please schedule a follow up Hart visit in 8 weeks, call sooner if needed with all medications /inhalers/ solutions in hand so we can verify exactly what you are taking. This includes all medications from all doctors and over the counters    PFTs on return before and after spirometry, no lung volumes or diffusing capacity       Ozell America, MD 07/10/2024

## 2024-07-10 ENCOUNTER — Encounter: Payer: Self-pay | Admitting: Internal Medicine

## 2024-07-10 NOTE — Assessment & Plan Note (Addendum)
 Onset July 2022 rx with mint/menthol products  - 03/26/2021 rec no mint/menthol and rx with max gerd rx x 6 weeks then ov > did not follow instructions - 03/31/2022 try again same rx but increase gabapentin  to 300 mg bid  and f/u in 4 weeks - 05/22/2023 recurrent off gerd/gabapentin  rx > restart  -  07/21/23  f/v loop with no significant  concavity and unusual sawtooth patter  exp > insp  Add 09/07/2023 :  refilling gabapentin  300 mg ? Taking bid> no change in RX  >>> apparently only taking gabapetin 300 mg q d so rec increase back to bid if tol  >>> continue max gerd rx   >>>  Return with all meds in hand using a trust but verify approach to confirm accurate Medication  Reconciliation The principal here is that until we are certain that the  patients are doing what we've asked, it makes no sense to ask them to do more.   >>> pfts on return    Each maintenance medication was reviewed in detail including emphasizing most importantly the difference between maintenance and prns and under what circumstances the prns are to be triggered using an action plan format where appropriate.  Total time for H and P, chart review, counseling, reviewing [facility]  device(s) and generating customized AVS unique to this office visit / same day charting = 34 min

## 2024-07-10 NOTE — Assessment & Plan Note (Addendum)
 Onset July 2022  - pt not sure whether better or not berfore or after HD as of 03/26/2021  - 03/26/2021 rec max rx for gerd, observe whether reported noct sob correlates with HD - 03/26/2021   Walked on RA x  3  lap(s) =  approx 150 @ moderate pace, stopped due to end of study with  min sob  with lowest 02 sats 97 - Echo 05/18/21 EF similar to echo done 08/13/20 . Left ventricular ejection fraction, by estimation, is 30 to 35%. The left ventricle has severely decreased  function. The left ventricle demonstrates global hypokinesis. The left  ventricular internal cavity size was  severely dilated. Left ventricular diastolic parameters were normal.   2. Right ventricular systolic function is mildly reduced. The right  ventricular size is mildly enlarged.   3. Left atrial size was moderately dilated.   4. Right atrial size was moderately dilated.   5. The mitral valve is abnormal. Mild to moderate mitral valve  regurgitation. No evidence of mitral stenosis.   6. The aortic valve is tricuspid. Aortic valve regurgitation is trivial.  Mild aortic valve sclerosis is present, with no evidence of aortic valve  stenosis.  Left Atrium: Left atrial size was moderately dilated. - 05/26/2021   Walked on RA  x  3  lap(s) =  approx 450 ft @ fast pace, stopped due to end of study, min sob last lap with lowest 02 sats 97%   - 03/31/2022    stiolto and pred trial and restart gerd rx  f/u in 4 weeks with all meds in hand  - 06/10/2022  After extensive coaching inhaler device,  effectiveness =    75% with SMI > rechallenge with stiolto x 2 weeks then try off pending /fu pfts > did not do -  05/22/2023   Walked on RA  x  3  lap(s) =  approx 450  ft  @ mod pace, stopped due to end of study with lowest 02 sats 96% with sob but not cp/ cough   - 05/22/2023  After extensive coaching inhaler device,  effectiveness =    75% with respimat from a baseline of near 0 so ok to rechallenge   - PFT's  07/21/23  FEV1 1.07 (37 % ) ratio  0.61  p 0 % improvement from saba p 0 prior to study with DLCO  15.85 (63%)   and FV curve no significant concavity / sawtooth pattern  and ERV 11 at wt 196    Copd pattern in a never 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  So it would be unsual, but he certainly acts more like copd than asthma  >>> no change in rx needed   >>> continue stiolto 2 each am (this may also help if has component  of cardiac asthma which is mediated via cholinergic nervous system)

## 2024-07-12 ENCOUNTER — Ambulatory Visit: Payer: Self-pay | Admitting: Internal Medicine

## 2024-07-12 DIAGNOSIS — R0609 Other forms of dyspnea: Secondary | ICD-10-CM

## 2024-07-29 NOTE — Progress Notes (Signed)
 Called the pt and there was no answer- LMTCB

## 2024-08-07 ENCOUNTER — Telehealth: Payer: Self-pay

## 2024-08-07 NOTE — Telephone Encounter (Signed)
 Printed letter for pt to return call regarding XRAY results. Placed in outgoing mail. NFN.

## 2024-08-07 NOTE — Progress Notes (Signed)
 ATCx2 LVMTCB.  Echo ordered. Mailing letter to pt to return our call for results. Letter placed in outgoing mail, NFN

## 2024-08-19 ENCOUNTER — Other Ambulatory Visit: Payer: Self-pay | Admitting: *Deleted

## 2024-08-19 DIAGNOSIS — R058 Other specified cough: Secondary | ICD-10-CM

## 2024-08-19 DIAGNOSIS — R0609 Other forms of dyspnea: Secondary | ICD-10-CM

## 2024-08-22 ENCOUNTER — Ambulatory Visit

## 2024-08-22 ENCOUNTER — Encounter

## 2024-08-22 DIAGNOSIS — R058 Other specified cough: Secondary | ICD-10-CM

## 2024-08-22 DIAGNOSIS — R0609 Other forms of dyspnea: Secondary | ICD-10-CM

## 2024-08-22 LAB — PULMONARY FUNCTION TEST
FEF 25-75 Post: 0.29 L/s
FEF 25-75 Pre: 0.82 L/s
FEF2575-%Change-Post: -63 %
FEF2575-%Pred-Post: 12 %
FEF2575-%Pred-Pre: 33 %
FEV1-%Change-Post: -19 %
FEV1-%Pred-Post: 24 %
FEV1-%Pred-Pre: 31 %
FEV1-Post: 0.78 L
FEV1-Pre: 0.97 L
FEV1FVC-%Change-Post: -1 %
FEV1FVC-%Pred-Pre: 103 %
FEV6-%Change-Post: -18 %
FEV6-%Pred-Post: 26 %
FEV6-%Pred-Pre: 31 %
FEV6-Post: 1.03 L
FEV6-Pre: 1.27 L
FEV6FVC-%Pred-Post: 105 %
FEV6FVC-%Pred-Pre: 105 %
FVC-%Change-Post: -18 %
FVC-%Pred-Post: 24 %
FVC-%Pred-Pre: 30 %
FVC-Post: 1.04 L
FVC-Pre: 1.27 L
Post FEV1/FVC ratio: 75 %
Post FEV6/FVC ratio: 100 %
Pre FEV1/FVC ratio: 77 %
Pre FEV6/FVC Ratio: 100 %

## 2024-08-22 NOTE — Progress Notes (Signed)
 Pre/post spiro performed today

## 2024-08-22 NOTE — Patient Instructions (Signed)
 Pre/post spiro performed today

## 2024-08-23 ENCOUNTER — Ambulatory Visit: Payer: Self-pay | Admitting: Internal Medicine

## 2024-08-23 ENCOUNTER — Encounter: Payer: Self-pay | Admitting: Internal Medicine

## 2024-08-23 ENCOUNTER — Ambulatory Visit: Admitting: Internal Medicine

## 2024-08-23 VITALS — BP 115/76 | HR 74 | Temp 97.7°F | Ht 69.0 in | Wt 215.0 lb

## 2024-08-23 DIAGNOSIS — R0609 Other forms of dyspnea: Secondary | ICD-10-CM

## 2024-08-23 DIAGNOSIS — R058 Other specified cough: Secondary | ICD-10-CM

## 2024-08-23 MED ORDER — PANTOPRAZOLE SODIUM 40 MG PO TBEC: DELAYED_RELEASE_TABLET | ORAL | 3 refills | Status: AC

## 2024-08-23 NOTE — Patient Instructions (Signed)
 Increase your gabapentin  back to 300 mg three x  daily and your cough should improve (take after dialysis)  Stiolto  2 puff first thing each am     Continue pantaprazole Take 30- 60 min before your first and last meals of the day (or after dialysis on those days) and  pepcid  before bedtime (famotidine )     My office will be contacting you by phone for referral to ENT   - if you don't hear back from my office within one week please call us  back or notify us  thru MyChart and we'll address it right away.   Please schedule a follow up office visit in 6-8  weeks, call sooner if needed  - bring all medications

## 2024-08-23 NOTE — Assessment & Plan Note (Addendum)
 Onset July 2022 rx with mint/menthol - 03/26/2021 rec no mint/menthol and rx with max gerd rx x 6 weeks then ov > did not follow instructions - 03/31/2022 try again same rx but increase gabapentin  to 300 mg bid  and f/u in 4 weeks - 05/22/2023 recurrent off gerd/gabapentin  rx > restart  -  07/21/23  f/v loop with no significant  concavity and unusual sawtooth patter  exp > insp  -  Added 09/07/2023 :  refilling gabapentin  300 mg  Taking bid> no change in RX - 08/23/2024 rec increase gabapentin  to 300 mg tid   >>>  referred to ENT/ [facility] Dr GORMAN Silvan for grinding throat clearing but in meantime max gerd rx /hard rock candy to promote throat swallowing instead of grinding    Comment: Upper airway cough syndrome (previously labeled PNDS),  is so named because it's frequently impossible to sort out how much is  CR/sinusitis with freq throat clearing (which can be related to primary GERD)   vs  causing  secondary ( extra esophageal)  GERD from wide swings in gastric pressure that occur with throat clearing, often  promoting self use of mint and menthol lozenges that reduce the lower esophageal sphincter tone and exacerbate the problem further in a cyclical fashion.   These are the same pts (now being labeled as having irritable larynx syndrome by some cough centers) who not infrequently have a history of having failed to tolerate ace inhibitors,  dry powder inhalers or biphosphonates or report having atypical/extraesophageal reflux symptoms from LPR (globus, throat clearing)  that don't respond to standard doses of PPI  and are easily confused as having aecopd or asthma flares by even experienced allergists/ pulmonologists (myself included).   >>> avoid DPI inhalers, max rx directed at LPR - see avs for instructions unique to this ov

## 2024-08-23 NOTE — Assessment & Plan Note (Addendum)
 Onset July 2022  - pt not sure whether better or not berfore or after HD as of 03/26/2021  - 03/26/2021 rec max rx for gerd, observe whether reported noct sob correlates with HD - 03/26/2021   Walked on RA x  3  lap(s) =  approx 150 @ moderate pace, stopped due to end of study with  min sob  with lowest 02 sats 97 - 05/26/2021   Walked on RA  x  3  lap(s) =  approx 450 ft @ fast pace, stopped due to end of study, min sob last lap with lowest 02 sats 97%   - 03/31/2022    stiolto and pred trial and restart gerd rx  f/u in 4 weeks with all meds in hand  - 06/10/2022  After extensive coaching inhaler device,  effectiveness =    75% with SMI > rechallenge with stiolto x 2 weeks then try off pending /fu pfts > did not do -  05/22/2023   Walked on RA  x  3  lap(s) =  approx 450  ft  @ mod pace, stopped due to end of study with lowest 02 sats 96% with sob but not cp/ cough   - 05/22/2023  After extensive coaching inhaler device,  effectiveness =    75% with respimat from a baseline of near 0 so ok to rechallenge   - PFT's  07/21/23  FEV1 1.07 (37 % ) ratio 0.61  p 0 % improvement from saba p 0 prior to study with DLCO  15.85 (63%)   and FV curve no significant concavity / sawtooth pattern  and ERV 11 at wt 196   - repeat Echo rec   07/12/2024 > not done as of 08/23/2024  - TEE 03/11/24  The left ventricular cavity size is severely increased by 2D Emmet).  The left ventricular systolic function is severely reduced with LVEF of 15% (+/-5%) by Teichholz method.  There is severe global hypokinesis of the left ventricle.  There is grade III left ventricular diastolic dysfunction.  The left atrial pressure is elevated.  Mildly increased thickness of the septal myocardium; mildly increased thickness of the posterior wall.  The right ventricular size is moderately dilated by visual assessment.  Right ventricular systolic function is moderately-severely reduced by quantitative assessment.  The left atrium is  moderately dilated by 2D indexed volume measurement.  The right atrium is moderately dilated.  There is mild tricuspid regurgitation.  There is mild mitral regurgitation.  - PFT's  08/23/23   FEV1 0.97 (31 % ) ratio 0.71  p 0 % improvement from saba p 0 prior to study    and FV curve no obstruction  and poor effort noted  - 08/23/2024   Walked on RA  x  2  lap(s) =  approx 500  ft  @ slow pace, stopped due to doe with lowest 02 sats 91%

## 2024-08-23 NOTE — Progress Notes (Signed)
 "   Charles Hart, male    DOB: 12-16-1955    MRN: [id number]   Brief patient profile:  15 yobm never smoker   referred to pulmonary clinic in [place]  03/26/2021 by Dr   Hart for cough abrupt onset early July 2022 assoc with sob noct but not with adls  HD   tues thurs sat   x 2015    History of Present Illness  03/26/2021  Pulmonary/ 1st Hart eval/ Charles [facility] / [place] [facility]  Chief Complaint  Patient presents with   Pulmonary Consult    Referred by Dr. Fairy Hart. Pt c/o non prod cough with SOB x 1 month.    Dyspnea:  MMRC1 = can walk nl pace, flat grade, can't hurry or go uphills or steps s sob   Cough: dry less when asleep  Sleep: sob worse  when lie down - can't tell worse on nights when not HD SABA use: none  Using lots of mints/ menthols due to sense of pnds  Rec Pantoprazole  (protonix ) 40 mg   Take  30-60 min before first meal of the day and Pepcid  (famotidine )  20 mg after supper until return to Hart -  GERD diet reviewed, bed blocks rec  Pay attention to how you do with your breathing when you lie down on Sat night  vs Sun vs Monday Please schedule a follow up Hart visit in 8 weeks, call sooner if needed with all medications /inhalers/ solutions in hand   Date of Admission: 05/17/2021  2:26 PM Date of Discharge: 05/20/2021  7:02 PM   Discharge Diagnosis: 1. Acute exacerbation of heart failure  2. Chronic systolic and diastolic Heart Failure 3. ESRD on Hemodialysis 4. Hypertension 5. Hypokalemia 6. Obstructive Sleep Apnea 7. Normocytic anemia   05/22/2023  f/u ov/[place] [facility]/Charles Hart re: cough/ doe  restarted  stiolto x month  / on prn gabapentin  (really not taking it nor gerd rx consistently  Chief Complaint  Patient presents with   Upper airway cough syndrome   Dyspnea:  lowe's walking  x 4 aisles and has to stop due to sob  Cough: some dry no pattern  Sleeping: 30 degrees  waking 2- 3 times per week cough / sob   SABA use: none  02: prn  during HD / no change on Monday nights vs any other  Rec Ok to continue Stiolto 2 puffs each am (like high octane fuel) 0k me to stop it when better  Work on inhaler technique:  Pantoprazole  (protonix ) 40 mg   Take  30-60 min before first meal of the day and Pepcid  (famotidine )  20 mg after supper until return to Hart GERD diet reviewed, bed blocks rec  Increase gabapentin  to 300 mg twice daily until return (take after dialysis) Cxr : CM s infiltrates   - PFT's  07/21/23  FEV1 1.07 (37 % ) ratio 0.61  p 0 % improvement from saba p 0 prior to study with DLCO  15.85 (63%)   and FV curve no significant concavity / sawtooth pattern  and ERV 11% at wt 196     07/24/2023  f/u ov/Charles Hart/Charles [facility] re: severe airflow obst   Chief Complaint  Patient presents with   Cough  Dyspnea: limited more by back than breathing - one day prior  to ov could do anything  but back pain comes and goes for years and can't do anything when in pain  Cough: better  Sleeping: no resp cc  SABA  use: none  02: none  Rec If problems wih breathing or cough please return  with all medications /inhalers/ solutions in hand   09/06/2023  f/u ov/[place] [facility]/Charles Hart re: severe airflow obst maint off stiolto/ did not bring any meds   Chief Complaint  Patient presents with   Follow-up    Pt states he been doing well and still is using Cpap at night here for PFT results  Dyspnea: better while on stiolto  Cough: resolved  on gabapentin  / documented hs is on 300 mg pills refilled at pharmacy as recently as 09/04/23 and supposed to be taking bid  Sleeping:  hob up 45 degrees s  resp cc  SABA use: none  02: none  Rec Stiolto is 2 puffs each am  - like high octane fuel  If you can't afford it but you really like it, check with you insurance company to see what they will pay for Please schedule a follow up visit in  6  months but call sooner if needed    07/08/2024  f/u ov/Charles Hart re: severe airflow obst ? Partly  artifact on pfts  maint on stiolto  and gabapentin   300 mg  Chief Complaint  Patient presents with   Medical Management of Chronic Issues   Shortness of Breath    Breathing is sometimes worse- usually right before he goes for dialysis. He has been coughing more recently- non prod, sometimes wakes him in the night.   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 on scooter x years with breathing sometimes worse if misses day of HD including his usual rotation where he misses one a day a week (mon on a  T- th- sat rotation)  Cough: dry day > night  Sleeping: HOB 45 degrees and slide down and wakes up due to cough and sob  SABA use: none  02: none  Patient Instructions  Increase your gabapentin  back to 300 mg twice daily and your cough should improve (take after dialysis) Stiolto  2 puff first thing each am  (not as needed)  Continue pantaprazole Take 30- 60 min before your first and last meals of the day (or after dialysis on those days) and  pepcid  before bedtime (famotidine ) schedule a follow up Hart visit in 8 weeks, call sooner if needed with all medications /inhalers/ solutions in hand  PFTs on return before and after spirometry, no lung volumes or diffusing capacity     08/23/2024  f/u ov/Charles Hart re: severe airflow obst ? artifact   maint on stiolto  did not bring all meds   Chief Complaint  Patient presents with   Shortness of Breath    Patient states his breathing is worse.  X-ray result   Dyspnea:  worse today  /sob  crossing  the parking lot/  rides the scooter to shop most the time/ pushed the cart last   Cough: clearing throat  Sleeping: HOB 45 min  resp cc  SABA use: none  02: none     No obvious day to day or daytime variability or assoc excess/ purulent sputum or mucus plugs or hemoptysis or cp or chest tightness, subjective wheeze or overt sinus or hb symptoms.    Also denies any obvious fluctuation of symptoms with weather or environmental changes or  other aggravating or alleviating factors except as outlined above   No unusual exposure hx or h/o childhood pna/ asthma or knowledge of premature birth.  Current Allergies, Complete  Past Medical History, Past Surgical History, Family History, and Social History were reviewed in [employer] record.  ROS  The following are not active complaints unless bolded Hoarseness, sore throat, dysphagia, dental problems, itching, sneezing,  nasal congestion or discharge of excess mucus or purulent secretions, ear ache,   fever, chills, sweats, unintended wt loss or wt gain, classically pleuritic or exertional cp,  orthopnea pnd or arm/hand swelling  or leg swelling, presyncope, palpitations, abdominal pain, anorexia, nausea, vomiting, diarrhea  or change in bowel habits or change in bladder habits, change in stools or change in urine, dysuria, hematuria,  rash, arthralgias, visual complaints, headache, numbness, weakness or ataxia or problems with walking or coordination,  change in mood or  memory.          Outpatient Medications Prior to Visit - - NOTE:   Unable to verify as accurately reflecting what pt takes    Medication Sig Dispense Refill   calcium  acetate (PHOSLO ) 667 MG capsule Take 2,668 mg by mouth 3 (three) times daily with meals.     gabapentin  (NEURONTIN ) 300 MG capsule Take 1 capsule (300 mg total) by mouth 2 (two) times daily. 180 capsule 3   Tiotropium Bromide-Olodaterol (STIOLTO RESPIMAT ) 2.5-2.5 MCG/ACT AERS INHALE 2 PUFFS BY MOUTH EVERY MORNING 12 g 3   acetaminophen  (TYLENOL ) 325 MG tablet Take 2 tablets (650 mg total) by mouth every 6 (six) hours as needed for mild pain. (Patient not taking: Reported on 08/23/2024)     carvedilol  (COREG ) 6.25 MG tablet Take 6.25 mg by mouth 2 (two) times daily. (Patient not taking: Reported on 08/23/2024)     famotidine  (PEPCID ) 20 MG tablet One after supper (Patient not taking: Reported on 08/23/2024) 90 tablet 3   fluticasone  (FLONASE )  50 MCG/ACT nasal spray Place 2 sprays into both nostrils as needed for allergies. (Patient not taking: Reported on 08/23/2024) 16 g 1   folic acid -vitamin b complex-vitamin c-selenium-zinc (DIALYVITE) 3 MG TABS tablet Take 1 tablet by mouth daily. (Patient not taking: Reported on 08/23/2024)     furosemide  (LASIX ) 80 MG tablet Take 1 tablet (80 mg total) by mouth daily. (Patient not taking: Reported on 08/23/2024) 90 tablet 0   hydrALAZINE  (APRESOLINE ) 100 MG tablet Take 1 tablet (100 mg total) by mouth 3 (three) times daily. (Patient not taking: Reported on 08/23/2024) 90 tablet 2   ketorolac (ACULAR) 0.5 % ophthalmic solution Place 1 drop into the left eye 3 (three) times daily as needed (help see better). (Patient not taking: Reported on 08/23/2024)     losartan  (COZAAR ) 25 MG tablet Take 25 mg by mouth daily. (Patient not taking: Reported on 08/23/2024)     oxyCODONE  (ROXICODONE ) 5 MG immediate release tablet Take 1 tablet (5 mg total) by mouth every 6 (six) hours as needed. (Patient not taking: Reported on 08/23/2024) 20 tablet 0   pantoprazole  (PROTONIX ) 40 MG tablet One twice daily (take after dialysis) (Patient not taking: Reported on 08/23/2024) 180 tablet 3   pravastatin  (PRAVACHOL ) 40 MG tablet Take 1 tablet (40 mg total) by mouth every evening. (Patient not taking: Reported on 08/23/2024) 90 tablet 1   No facility-administered medications prior to visit.              Past Medical History:  Diagnosis Date   Adenomatous colon polyp 07/02/2011   Last colonoscopy May 06, 2011 by Dr. Toribio Cedar, who recommended repeat colonoscopy in 5 years.    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 Octavia    Cancer [facility])    Kidney   Cardiomyopathy    LV function improved from 2004 to 2008.  Historically, moderately dilated LV with EF 30-40% by 2D echo 08/14/2002.  Mild CAD with severe LV dysfunction by cardiac cath 09/2002.  Normal coronary arteries and normal LV  function by cardiac cath 09/19/2006.  A 2-D echo on 04/01/2009 showed mild concentric hypertrophy and normal systolic (LVEF  60-65%) and doppler C/W with grade 1 diastolic dysfunction.   CHF (congestive heart failure) (HCC)    LV function improved from 2004 to 2008.  Historically, moderately dilated LV with EF 30-40% by 2D echo 08/14/2002.  Mild CAD with severe LV dysfunction by cardiac cath 09/2002.  Normal coronary arteries and normal LV function by cardiac cath 09/19/2006.  A 2-D echo on 04/01/2009 showed mild concentric hypertrophy and normal systolic (LVEF  60-65%) and doppler C/W with grade 1 diastolic dysfunction..    Chronic combined systolic and diastolic congestive heart failure (HCC) 05/21/2010   LV function improved from 2004 to 2008.  Historically, moderately dilated LV with EF 30-40% by 2D echo 08/14/2002.  Mild CAD with severe LV dysfunction by cardiac cath 09/2002.  Normal coronary arteries and normal LV function by cardiac cath 09/19/2006.  A 2-D echo on 04/01/2009 showed mild concentric hypertrophy and normal systolic (LVEF  60-65%) and doppler parameters consistent with abnormal left    CVA (cerebral vascular accident) (HCC) 07/04/2012   MRI of the brain 07/04/2012 showed an acute infarct in the right basal ganglia involving the anterior putamen, anterior limb internal capsule, and head of the caudate; this measured approximately 2.5 cm in diameter.      Dermatitis    Diabetes mellitus    type 2   DIABETIC PERIPHERAL NEUROPATHY 08/03/2007   Qualifier: Diagnosis of  By: Lanis MD, Christopher     DM neuropathy, painful (HCC)    fingers and right knee   ESRD (end stage renal disease) (HCC)    Stage 4, on hemodailysis x 4 months as of 05-18-18 [facility], tues thurs sat   ESRD (end stage renal disease) on dialysis (HCC)    TTS; [facility] (05/31/2018)   Hearing loss in right ear    Hyperlipidemia    Hypertension    Hypertensive crisis 07/28/2012   Hypertensive urgency 08/20/2014    Myocardial infarction (HCC)  many yrs ago   Nephrotic syndrome 02/18/2013   A 24-hour urine collection 03/04/2013 showed total protein of 5,460 g and creatinine clearance of 80 mL/minute.  Patient was seen by Zelma Gallon at [facility] and a repeat 24-hour urine showed 10,407 mg protein.  Patient underwent kidney biopsy on 05/30/2013; pathology showed advanced diffuse and nodular diabetic nephropathy with vascular changes consistent with long-standing difficult to control hypertension.      Pneumonia 2014 and 2015   Renal insufficiency    Sleep apnea    11/06/20 - hasn't used it in a while        Objective:    Wts   08/23/2024          96  07/08/2024     209  07/24/2023       202 05/22/2023       193   06/10/2022     190   03/31/2022       176   05/26/21 200 lb 1.9 oz (90.8 kg)  05/20/21 194 lb 7.1 oz (  88.2 kg)  05/06/21 205 lb 14.4 oz (93.4 kg)    Vital signs reviewed  08/23/2024  - Note at rest 02 sats  96% on RA   General appearance:    amb  mod obese (by bmi) bm thoroughly confused with details of care grinding the airway to clear it      HEENT : Oropharynx  clear/ edentulous / no pnd  Nasal turbinates nl    NECK :  without  apparent JVD/ palpable Nodes/TM    LUNGS: no acc muscle use,  Min barrel/ mild kyphotic  contour chest wall with bilateral  Distant bs s audible wheeze and  without cough on insp or exp maneuvers  and mild  Hyperresonant  to  percussion bilaterally     CV:  RRR  no s3 or murmur or increase in P2, and no edema   ABD:  soft and nontender   MS:  Nl gait/ ext warm without deformities Or obvious joint restrictions  calf tenderness, cyanosis or clubbing     SKIN: warm and dry without lesions    NEURO:  alert, approp, nl sensorium with  no motor or cerebellar deficits apparent.         Assessment & Plan Upper airway cough syndrome Onset July 2022 rx with mint/menthol - 03/26/2021 rec no mint/menthol and rx with max gerd  rx x 6 weeks then ov > did not follow instructions - 03/31/2022 try again same rx but increase gabapentin  to 300 mg bid  and f/u in 4 weeks - 05/22/2023 recurrent off gerd/gabapentin  rx > restart  -  07/21/23  f/v loop with no significant  concavity and unusual sawtooth patter  exp > insp  -  Added 09/07/2023 :  refilling gabapentin  300 mg  Taking bid> no change in RX - 08/23/2024 rec increase gabapentin  to 300 mg tid   >>>  referred to ENT/ [facility] Dr GORMAN Silvan for grinding throat clearing but in meantime max gerd rx /hard rock candy to promote throat swallowing instead of grinding    Comment: Upper airway cough syndrome (previously labeled PNDS),  is so named because it's frequently impossible to sort out how much is  CR/sinusitis with freq throat clearing (which can be related to primary GERD)   vs  causing  secondary ( extra esophageal)  GERD from wide swings in gastric pressure that occur with throat clearing, often  promoting self use of mint and menthol lozenges that reduce the lower esophageal sphincter tone and exacerbate the problem further in a cyclical fashion.   These are the same pts (now being labeled as having irritable larynx syndrome by some cough centers) who not infrequently have a history of having failed to tolerate ace inhibitors,  dry powder inhalers or biphosphonates or report having atypical/extraesophageal reflux symptoms from LPR (globus, throat clearing)  that don't respond to standard doses of PPI  and are easily confused as having aecopd or asthma flares by even experienced allergists/ pulmonologists (myself included).   >>> avoid DPI inhalers, max rx directed at LPR - see avs for instructions unique to this ov     DOE (dyspnea on exertion) Onset July 2022  - pt not sure whether better or not berfore or after HD as of 03/26/2021  - 03/26/2021 rec max rx for gerd, observe whether reported noct sob correlates with HD - 03/26/2021   Walked on RA x  3  lap(s) =  approx 150 @  moderate pace, stopped due to end  of study with  min sob  with lowest 02 sats 97 - 05/26/2021   Walked on RA  x  3  lap(s) =  approx 450 ft @ fast pace, stopped due to end of study, min sob last lap with lowest 02 sats 97%   - 03/31/2022    stiolto and pred trial and restart gerd rx  f/u in 4 weeks with all meds in hand  - 06/10/2022  After extensive coaching inhaler device,  effectiveness =    75% with SMI > rechallenge with stiolto x 2 weeks then try off pending /fu pfts > did not do -  05/22/2023   Walked on RA  x  3  lap(s) =  approx 450  ft  @ mod pace, stopped due to end of study with lowest 02 sats 96% with sob but not cp/ cough   - 05/22/2023  After extensive coaching inhaler device,  effectiveness =    75% with respimat from a baseline of near 0 so ok to rechallenge   - PFT's  07/21/23  FEV1 1.07 (37 % ) ratio 0.61  p 0 % improvement from saba p 0 prior to study with DLCO  15.85 (63%)   and FV curve no significant concavity / sawtooth pattern  and ERV 11 at wt 196   - repeat Echo rec   07/12/2024 > not done as of 08/23/2024  - TEE 03/11/24  The left ventricular cavity size is severely increased by 2D Emmet).  The left ventricular systolic function is severely reduced with LVEF of 15% (+/-5%) by Teichholz method.  There is severe global hypokinesis of the left ventricle.  There is grade III left ventricular diastolic dysfunction.  The left atrial pressure is elevated.  Mildly increased thickness of the septal myocardium; mildly increased thickness of the posterior wall.  The right ventricular size is moderately dilated by visual assessment.  Right ventricular systolic function is moderately-severely reduced by quantitative assessment.  The left atrium is moderately dilated by 2D indexed volume measurement.  The right atrium is moderately dilated.  There is mild tricuspid regurgitation.  There is mild mitral regurgitation.  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  Total time for H and P, chart review, counseling, reviewing SMI (elipta)  device(s) , directly observing portions of ambulatory 02 saturation study/ and generating customized AVS unique to this Hart visit / same day charting = 45 min n for multiple chronic   refractory respiratory  symptoms of uncertain etiology  AVS  Patient Instructions  Increase your gabapentin  back to 300 mg three x  daily and your cough should improve (take after dialysis)  Stiolto  2 puff first thing each am     Continue pantaprazole Take 30- 60 min before your first and last meals of the day (or after dialysis on those days) and  pepcid  before bedtime (famotidine )     My Hart will be contacting you by phone for referral to ENT   - if you don't hear back from my Hart within one week please call us  back or notify us  thru MyChart and we'll address it right away.   Please schedule a follow up Hart visit in 6-8  weeks, call sooner if needed  - bring all medications   Ozell America, MD 08/23/2024            "

## 2024-09-04 ENCOUNTER — Ambulatory Visit: Admitting: Student

## 2024-09-04 ENCOUNTER — Ambulatory Visit: Payer: Self-pay

## 2024-09-04 ENCOUNTER — Other Ambulatory Visit: Payer: Self-pay

## 2024-09-04 VITALS — BP 152/91 | HR 81 | Temp 97.8°F | Ht 69.0 in | Wt 218.0 lb

## 2024-09-04 DIAGNOSIS — I5023 Acute on chronic systolic (congestive) heart failure: Secondary | ICD-10-CM

## 2024-09-04 MED ORDER — TORSEMIDE 40 MG PO TABS: 80.0000 mg | ORAL_TABLET | Freq: Every day | ORAL | 0 refills | Status: DC

## 2024-09-04 NOTE — Assessment & Plan Note (Signed)
 Presents with c/o DOE for a year that has been progressively worsening more the past month. Reports sxs of Orthopnea with HOB at 45 degrees and LE edema. Has chronic cough that is not worsening, no recent fevers or congestion, no sick contacts. Last HD session was yesterday tolerated it well. Exam is notable for bibasilar crackles and 2+ pitting edema bilaterally. He does not check his weight at home however his weight today 218 pounds compared to 215 pounds on 1/9.   He follows [facility], advanced heart failure and transplant cardiology, last office visit 03/11/2024, he has pertinent history of nonischemic cardiomyopathy, severe pulmonary hypertension, PVR > 9.  Echo on 03/11/2024 showed LVEF 15%, severe LV enlargement, grade 3 diastolic dysfunction, RV moderately dilated with moderate dysfunction, BAE, dilated and noncompressible IVC.  Patient had RHC on 01/31/2024 that showed RA 13, PA 84/36 (55), W 20, CI 1.87, PA sate 25% (AV fistula occluded for the study), PVR >9 WU.  He is currently on Coreg  3.125 mg twice daily, HCTZ 25 mg daily, Lasix  80 mg daily, losartan  25 mg daily.  He is currently not taking hydralazine  100 mg 3 times daily.  Per the note the recommendation was to determine more aggressive diuresis and recheck RHC after 3 months; he does not qualify for OHT (Orthotopic heart transplantation (OHT)) and renal transplant due to his severe fixed pulmonary hypertension.   Per the clinical findings and symptoms, the patient is presenting with acute HFrEF exacerbation, noted, not hypoxic or respiratory distress.  With his history of ESRD, he reports that he makes less urine.  Less likely COPD exacerbation.  - Torsemide  80 mg for two days, then go back to lasix  80 mg daily - F/u CBC and BNP; he will get BMP tomorrow post-dialysis  - He is advised to notify the HD center about his current increase in weight and edema > withdraw more volume.  - Advised to f/u with [facility] sooner than February for his symptoms (progressive worsening)  - Based on the last cardiology note, he is due to repeat RHC.

## 2024-09-04 NOTE — Progress Notes (Signed)
 "  Established Patient Office Visit  Subjective   Patient ID: Charles Hart, male    DOB: [birth year]  Age: 69 y.o. MRN: [id number]  Chief Complaint  Patient presents with   Shortness of Breath    Patient states he been having SOB on going about a year    Charles Hart is a 69 y.o. male with past medical history of combined systolic and diastolic congestive heart failure, hypertension, bilateral carotid artery disease, obstructive sleep apnea, controlled type 2 diabetes, ESRD on HD, dyspnea on exertion presents today for an acute visit for shortness of breath.  Review of Systems:  As per assessment and Plan   Objective:     Vitals:   09/04/24 0955  BP: (!) 152/91  Pulse: 81  Temp: 97.8 F (36.6 C)  TempSrc: Oral  SpO2: 93%  Weight: 218 lb (98.9 kg)  Height: 5' 9 (1.753 m)    Physical Exam General: Sitting in chair, no acute distress Cardiovascular: Regular rate Pulmonary: +bibasilar crackles  Abdomen: Soft, nontender, nondistended MSK: 2+ pitting edema bilateral LE   The ASCVD Risk score (Arnett DK, et al., 2019) failed to calculate for the following reasons:   Risk score cannot be calculated because patient has a medical history suggesting prior/existing ASCVD   * - Cholesterol units were assumed    Assessment & Plan:   Patient discussed with Dr. Jeanelle  Assessment & Plan Acute on chronic HFrEF (heart failure with reduced ejection fraction) (HCC) Presents with c/o DOE for a year that has been progressively worsening more the past month. Reports sxs of Orthopnea with HOB at 45 degrees and LE edema. Has chronic cough that is not worsening, no recent fevers or congestion, no sick contacts. Last HD session was yesterday tolerated it well. Exam is notable for bibasilar crackles and 2+ pitting edema bilaterally. He does not check his weight at home however his weight today 218 pounds compared to 215 pounds on 1/9.   He follows [facility], advanced heart failure and transplant cardiology, last office visit 03/11/2024, he has pertinent history of nonischemic cardiomyopathy, severe pulmonary hypertension, PVR > 9.  Echo on 03/11/2024 showed LVEF 15%, severe LV enlargement, grade 3 diastolic dysfunction, RV moderately dilated with moderate dysfunction, BAE, dilated and noncompressible IVC.  Patient had RHC on 01/31/2024 that showed RA 13, PA 84/36 (55), W 20, CI 1.87, PA sate 25% (AV fistula occluded for the study), PVR >9 WU.  He is currently on Coreg  3.125 mg twice daily, HCTZ 25 mg daily, Lasix  80 mg daily, losartan  25 mg daily.  He is currently not taking hydralazine  100 mg 3 times daily.  Per the note the recommendation was to determine more aggressive diuresis and recheck RHC after 3 months; he does not qualify for OHT (Orthotopic heart transplantation (OHT)) and renal transplant due to his severe fixed pulmonary hypertension.   Per the clinical findings and symptoms, the patient is presenting with acute HFrEF exacerbation, noted, not hypoxic or respiratory distress.  With his history of ESRD, he reports that he makes less urine.  Less likely COPD exacerbation.  - Torsemide  80 mg for two days, then go back to lasix  80 mg daily - F/u CBC and BNP; he will get BMP tomorrow post-dialysis  - He is advised to notify the HD center about his current increase in weight and edema > withdraw more volume.  - Advised to f/u with [facility] sooner than February  for his symptoms (progressive worsening)  - Based on the last cardiology note, he is due to repeat RHC.    Problem List Items Addressed This Visit       Cardiovascular and Mediastinum   Acute on chronic HFrEF (heart failure with reduced ejection fraction) (HCC) - Primary   Relevant Medications   torsemide  40 MG TABS   Other Relevant Orders   Pro b natriuretic peptide (BNP)   CBC no Diff    Return in about 1 month (around 10/05/2024) for Heart failure .     Toma Edwards, DO "

## 2024-09-04 NOTE — Telephone Encounter (Signed)
" °  FYI Only or Action Required?: FYI only for provider: appointment scheduled on 09/04/24.  Patient was last seen in primary care on 03/18/2024 by Rosan Dayton BROCKS, DO.  Called Nurse Triage reporting Breathing Problem.  Symptoms began several days ago.  Interventions attempted: Nothing.  Symptoms are: unchanged.  Triage Disposition: See HCP Within 4 Hours (Or PCP Triage)  Patient/caregiver understands and will follow disposition?: Yes   Message from Graeme ORN sent at 09/04/2024  8:55 AM EST  Summary: trouble breathing   Reason for Triage: trouble breathing and sleeping at night          Last episdoes, several times last night, it goes away once sitting up Reason for Disposition  [1] Longstanding difficulty breathing (e.g., CHF, COPD, emphysema) AND [2] WORSE than normal  Answer Assessment - Initial Assessment Questions Scheduled 09/04/24 Advised 911 if symptoms occur/worsen: severe diff breathing, chest pain > 5 min, faint. Patient verbalized understanding.   Advised daily wt, call MD if >2lbs wt gain day, swelling, coughing  1. RESPIRATORY STATUS: Describe your breathing? (e.g., wheezing, shortness of breath, unable to speak, severe coughing)      Sob with exertion, sob when laying down to sleep; denies sob at rest 2. ONSET: When did this breathing problem begin?      year 3. PATTERN Does the difficult breathing come and go, or has it been constant since it started?      Comes and goes 4. SEVERITY: How bad is your breathing? (e.g., mild, moderate, severe)      moderate 5. RECURRENT SYMPTOM: Have you had difficulty breathing before? If Yes, ask: When was the last time? and What happened that time?      Yes, seeing pulm 6. CARDIAC HISTORY: Do you have any history of heart disease? (e.g., heart attack, angina, bypass surgery, angioplasty)      Chf,renal failure; hemodialysis  Swelling; from calves to feet; reports left dialysis with 2L of fluid on him  yesterday; [facility] dialysis  Coughing; clear 7. LUNG HISTORY: Do you have any history of lung disease?  (e.g., pulmonary embolus, asthma, emphysema)     no 8. CAUSE: What do you think is causing the breathing problem?      unsure 9. OTHER SYMPTOMS: Do you have any other symptoms? (e.g., chest pain, cough, dizziness, fever, runny nose)     Denies chest pain, dizziness, fever chills n/v, runny nose 10. O2 SATURATION MONITOR:  Do you use an oxygen  saturation monitor (pulse oximeter) at home? If Yes, ask: What is your reading (oxygen  level) today? What is your usual oxygen  saturation reading? (e.g., 95%)       Has not check Cpap at night  Protocols used: Breathing Difficulty-A-AH  "

## 2024-09-04 NOTE — Patient Instructions (Addendum)
 Thank you, [name] for allowing us  to provide your care today. Today we discussed:  Please take torsemide , 2 tablets by mouth today and 2 tablets by mouth tomorrow ( do not Furosemide  when you are taking torsemide )   Please reschedule the appointment with your cardiologist to see them sooner in the next 2 weeks.  When you go to your dialysis session tomorrow, let them know that you are holding more fluid so they can take out more liters.  I have ordered the following labs for you:   Lab Orders         Pro b natriuretic peptide (BNP)         CBC no Diff      Tests ordered today:  As above   Referrals ordered today:   Referral Orders  No referral(s) requested today     I have ordered the following medication/changed the following medications:   Stop the following medications: There are no discontinued medications.   Start the following medications: Meds ordered this encounter  Medications   torsemide  40 MG TABS    Sig: Take 80 mg by mouth daily.    Dispense:  4 tablet    Refill:  0     Follow up:  1 month for heart failure exacerbation   Remember:  Should you have any questions or concerns please call the internal medicine clinic at [phone].     Dr. Toma [facility]

## 2024-09-05 ENCOUNTER — Telehealth: Payer: Self-pay

## 2024-09-05 ENCOUNTER — Ambulatory Visit: Payer: Self-pay | Admitting: Student

## 2024-09-05 DIAGNOSIS — I5023 Acute on chronic systolic (congestive) heart failure: Secondary | ICD-10-CM

## 2024-09-05 LAB — CBC
Hematocrit: 33.4 % — ABNORMAL LOW (ref 37.5–51.0)
Hemoglobin: 10.7 g/dL — ABNORMAL LOW (ref 13.0–17.7)
MCH: 30.4 pg (ref 26.6–33.0)
MCHC: 32 g/dL (ref 31.5–35.7)
MCV: 95 fL (ref 79–97)
Platelets: 79 x10E3/uL — CL (ref 150–450)
RBC: 3.52 x10E6/uL — ABNORMAL LOW (ref 4.14–5.80)
RDW: 13.4 % (ref 11.6–15.4)
WBC: 4.2 x10E3/uL (ref 3.4–10.8)

## 2024-09-05 LAB — PRO B NATRIURETIC PEPTIDE: NT-Pro BNP: >70000 pg/mL — ABNORMAL HIGH (ref 0–376)

## 2024-09-05 MED ORDER — TORSEMIDE 20 MG PO TABS: 80.0000 mg | ORAL_TABLET | Freq: Every day | ORAL | 0 refills | Status: AC

## 2024-09-05 NOTE — Progress Notes (Signed)
 Internal Medicine Clinic Attending  Case discussed with the resident at the time of the visit.  We reviewed the resident's history and exam and pertinent patient test results.  I agree with the assessment, diagnosis, and plan of care documented in the resident's note.

## 2024-09-05 NOTE — Telephone Encounter (Signed)
 Updated rx sent

## 2024-09-05 NOTE — Telephone Encounter (Signed)
 Received a fax from the pharmacy regarding a rx for Torsemide . Per pharmacy Soannz 40 mg is not covered. Does not come in generic for this strength. Please resend for 20 mg generic and adjust directions accordingly. The patients insurance [employer] requires a new prescription.   [id number] - [place],  - [address] AT [facility] [address]

## 2024-09-12 NOTE — Telephone Encounter (Unsigned)
 Copied from CRM #[id number]. Topic: General - Other >> Sep 11, 2024  2:53 PM Graeme ORN wrote: Reason for CRM: Just FYI - patient called states he has paperwork that needs to be completed by Dr Rosan only. He will bring the paperwork by but will need a copy of it. Scheduled for 1st available with him 3/9. Thank You    ----------------------------------------------------------------------- From previous Reason for Contact - Scheduling: Patient/patient representative is calling to schedule an appointment. Refer to attachments for appointment information.

## 2024-09-17 ENCOUNTER — Telehealth: Payer: Self-pay | Admitting: *Deleted

## 2024-09-17 MED ORDER — HYDRALAZINE HCL 100 MG PO TABS: 100.0000 mg | ORAL_TABLET | Freq: Three times a day (TID) | ORAL | 5 refills | Status: AC

## 2024-09-17 NOTE — Telephone Encounter (Addendum)
Hydralazine is not on current med list

## 2024-09-19 ENCOUNTER — Telehealth: Payer: Self-pay | Admitting: *Deleted

## 2024-09-19 MED ORDER — FUROSEMIDE 80 MG PO TABS: 80.0000 mg | ORAL_TABLET | Freq: Every day | ORAL | 1 refills | Status: AC

## 2024-09-19 NOTE — Telephone Encounter (Signed)
 Call to [facility].  Will add to profile when prescription is received and will ship to patient.

## 2024-09-19 NOTE — Telephone Encounter (Signed)
 Call to [facility].  Show Furosemide  a being a capsule and not an injection correction.  Also informed them that patient is no longer on the furosemide  and was placed on torsemide  which has also been ended.   Will forward to PCP to see what patient is on.                                               Copied from CRM #[id number]. Topic: Clinical - Medication Refill >> Sep 19, 2024  3:40 PM Carrielelia G wrote: Tamala with [facility] calling for RX refill for the medication:   Medication: furosemide  (LASIX ) injection 80 mg    Has the patient contacted their pharmacy? No (Agent: If no, request that the patient contact the pharmacy for the refill. If patient does not wish to contact the pharmacy document the reason why and proceed with request.) (Agent: If yes, when and what did the pharmacy advise?)  This is the patient's preferred pharmacy:   [facility] [place]  [place], [place] - [address] [place] [address] Phone: [phone] Fax: [phone]

## 2024-09-19 NOTE — Telephone Encounter (Signed)
 He should be on furosemide  80mg  daily

## 2024-09-27 ENCOUNTER — Other Ambulatory Visit (HOSPITAL_BASED_OUTPATIENT_CLINIC_OR_DEPARTMENT_OTHER)

## 2024-10-21 ENCOUNTER — Ambulatory Visit: Payer: Self-pay | Admitting: Internal Medicine

## 2024-10-24 ENCOUNTER — Ambulatory Visit: Admitting: Internal Medicine
